# Patient Record
Sex: Female | Born: 1955 | Race: Black or African American | Hispanic: No | Marital: Married | Smoking: Never smoker
Health system: Southern US, Community
[De-identification: ages and names within clinical notes are randomized; demographics above are authoritative.]

## PROBLEM LIST (undated history)

## (undated) DIAGNOSIS — Z8619 Personal history of other infectious and parasitic diseases: Secondary | ICD-10-CM

## (undated) DIAGNOSIS — I471 Supraventricular tachycardia, unspecified: Secondary | ICD-10-CM

## (undated) DIAGNOSIS — E1140 Type 2 diabetes mellitus with diabetic neuropathy, unspecified: Secondary | ICD-10-CM

## (undated) DIAGNOSIS — E11319 Type 2 diabetes mellitus with unspecified diabetic retinopathy without macular edema: Secondary | ICD-10-CM

## (undated) DIAGNOSIS — R079 Chest pain, unspecified: Secondary | ICD-10-CM

## (undated) DIAGNOSIS — M549 Dorsalgia, unspecified: Secondary | ICD-10-CM

## (undated) DIAGNOSIS — H5440 Blindness, one eye, unspecified eye: Secondary | ICD-10-CM

## (undated) DIAGNOSIS — N289 Disorder of kidney and ureter, unspecified: Secondary | ICD-10-CM

## (undated) DIAGNOSIS — I252 Old myocardial infarction: Secondary | ICD-10-CM

## (undated) DIAGNOSIS — D500 Iron deficiency anemia secondary to blood loss (chronic): Secondary | ICD-10-CM

## (undated) DIAGNOSIS — H548 Legal blindness, as defined in USA: Secondary | ICD-10-CM

## (undated) DIAGNOSIS — Z973 Presence of spectacles and contact lenses: Secondary | ICD-10-CM

## (undated) DIAGNOSIS — R600 Localized edema: Secondary | ICD-10-CM

## (undated) DIAGNOSIS — H409 Unspecified glaucoma: Secondary | ICD-10-CM

## (undated) DIAGNOSIS — I5032 Chronic diastolic (congestive) heart failure: Secondary | ICD-10-CM

## (undated) DIAGNOSIS — N183 Chronic kidney disease, stage 3 unspecified: Secondary | ICD-10-CM

## (undated) DIAGNOSIS — G8929 Other chronic pain: Secondary | ICD-10-CM

## (undated) DIAGNOSIS — M1990 Unspecified osteoarthritis, unspecified site: Secondary | ICD-10-CM

## (undated) DIAGNOSIS — E785 Hyperlipidemia, unspecified: Secondary | ICD-10-CM

## (undated) DIAGNOSIS — R0600 Dyspnea, unspecified: Secondary | ICD-10-CM

## (undated) DIAGNOSIS — I2510 Atherosclerotic heart disease of native coronary artery without angina pectoris: Secondary | ICD-10-CM

## (undated) DIAGNOSIS — K219 Gastro-esophageal reflux disease without esophagitis: Secondary | ICD-10-CM

## (undated) DIAGNOSIS — I10 Essential (primary) hypertension: Secondary | ICD-10-CM

## (undated) DIAGNOSIS — R531 Weakness: Secondary | ICD-10-CM

## (undated) DIAGNOSIS — J302 Other seasonal allergic rhinitis: Secondary | ICD-10-CM

## (undated) DIAGNOSIS — G4733 Obstructive sleep apnea (adult) (pediatric): Secondary | ICD-10-CM

## (undated) DIAGNOSIS — Z6841 Body Mass Index (BMI) 40.0 and over, adult: Secondary | ICD-10-CM

## (undated) DIAGNOSIS — E119 Type 2 diabetes mellitus without complications: Secondary | ICD-10-CM

## (undated) DIAGNOSIS — Z794 Long term (current) use of insulin: Secondary | ICD-10-CM

## (undated) HISTORY — DX: Body Mass Index (BMI) 40.0 and over, adult: Z684

## (undated) HISTORY — PX: DILATION AND CURETTAGE OF UTERUS: SHX78

## (undated) HISTORY — DX: Other seasonal allergic rhinitis: J30.2

## (undated) HISTORY — DX: Gastro-esophageal reflux disease without esophagitis: K21.9

## (undated) HISTORY — DX: Morbid (severe) obesity due to excess calories: E66.01

## (undated) HISTORY — PX: CARDIAC CATHETERIZATION: SHX172

## (undated) HISTORY — PX: CATARACT EXTRACTION W/ INTRAOCULAR LENS IMPLANT: SHX1309

## (undated) HISTORY — PX: CATARACT EXTRACTION: SUR2

## (undated) HISTORY — PX: EYE SURGERY: SHX253

---

## 2006-09-06 ENCOUNTER — Ambulatory Visit: Payer: Self-pay | Admitting: Internal Medicine

## 2006-09-07 ENCOUNTER — Ambulatory Visit: Payer: Self-pay | Admitting: *Deleted

## 2006-09-07 ENCOUNTER — Encounter (INDEPENDENT_AMBULATORY_CARE_PROVIDER_SITE_OTHER): Payer: Self-pay | Admitting: Nurse Practitioner

## 2006-09-07 LAB — CONVERTED CEMR LAB
MCHC: 31.5 g/dL
MCV: 84.4 fL
RBC: 5 M/uL

## 2006-09-13 ENCOUNTER — Ambulatory Visit: Payer: Self-pay | Admitting: Internal Medicine

## 2006-10-11 ENCOUNTER — Ambulatory Visit: Payer: Self-pay | Admitting: Internal Medicine

## 2006-10-13 ENCOUNTER — Ambulatory Visit: Payer: Self-pay | Admitting: Internal Medicine

## 2006-11-01 ENCOUNTER — Encounter (INDEPENDENT_AMBULATORY_CARE_PROVIDER_SITE_OTHER): Payer: Self-pay | Admitting: Nurse Practitioner

## 2006-11-01 ENCOUNTER — Ambulatory Visit: Payer: Self-pay | Admitting: Internal Medicine

## 2006-11-01 LAB — CONVERTED CEMR LAB
CO2: 27 meq/L
Calcium: 9.3 mg/dL
Creatinine, Ser: 0.77 mg/dL
Potassium: 4.5 meq/L
Sodium: 142 meq/L

## 2007-04-17 ENCOUNTER — Encounter (INDEPENDENT_AMBULATORY_CARE_PROVIDER_SITE_OTHER): Payer: Self-pay | Admitting: Nurse Practitioner

## 2007-04-17 ENCOUNTER — Telehealth (INDEPENDENT_AMBULATORY_CARE_PROVIDER_SITE_OTHER): Payer: Self-pay | Admitting: Internal Medicine

## 2007-04-17 DIAGNOSIS — D509 Iron deficiency anemia, unspecified: Secondary | ICD-10-CM

## 2007-04-17 DIAGNOSIS — I10 Essential (primary) hypertension: Secondary | ICD-10-CM

## 2007-04-18 ENCOUNTER — Ambulatory Visit: Payer: Self-pay | Admitting: Nurse Practitioner

## 2007-04-18 DIAGNOSIS — H5310 Unspecified subjective visual disturbances: Secondary | ICD-10-CM | POA: Insufficient documentation

## 2007-04-18 LAB — CONVERTED CEMR LAB
ALT: <8 units/L (ref 0–35)
AST: 12 units/L (ref 0–37)
Anti Nuclear Antibody(ANA): NEGATIVE
Basophils Absolute: 0 10*3/uL (ref 0.0–0.1)
Basophils Relative: 0 % (ref 0–1)
CO2: 22 meq/L (ref 19–32)
Calcium: 9.3 mg/dL (ref 8.4–10.5)
Chloride: 101 meq/L (ref 96–112)
Creatinine, Ser: 0.84 mg/dL (ref 0.40–1.20)
Eosinophils Absolute: 0.1 10*3/uL (ref 0.0–0.7)
Eosinophils Relative: 1 % (ref 0–5)
HCT: 39.2 % (ref 36.0–46.0)
Hemoglobin: 12.7 g/dL (ref 12.0–15.0)
MCHC: 32.4 g/dL (ref 30.0–36.0)
MCV: 82.7 fL (ref 78.0–100.0)
Neutro Abs: 8 10*3/uL — ABNORMAL HIGH (ref 1.7–7.7)
Platelets: 311 10*3/uL (ref 150–400)
Potassium: 3.8 meq/L (ref 3.5–5.3)
Total Protein: 7.6 g/dL (ref 6.0–8.3)
WBC: 11.1 10*3/uL — ABNORMAL HIGH (ref 4.0–10.5)

## 2007-04-19 ENCOUNTER — Encounter (INDEPENDENT_AMBULATORY_CARE_PROVIDER_SITE_OTHER): Payer: Self-pay | Admitting: *Deleted

## 2007-05-08 ENCOUNTER — Encounter (INDEPENDENT_AMBULATORY_CARE_PROVIDER_SITE_OTHER): Payer: Self-pay | Admitting: Internal Medicine

## 2007-05-11 ENCOUNTER — Encounter (INDEPENDENT_AMBULATORY_CARE_PROVIDER_SITE_OTHER): Payer: Self-pay | Admitting: Internal Medicine

## 2007-06-16 ENCOUNTER — Encounter (INDEPENDENT_AMBULATORY_CARE_PROVIDER_SITE_OTHER): Payer: Self-pay | Admitting: Internal Medicine

## 2007-06-17 ENCOUNTER — Inpatient Hospital Stay (HOSPITAL_COMMUNITY): Admission: EM | Admit: 2007-06-17 | Discharge: 2007-06-20 | Payer: Self-pay | Admitting: *Deleted

## 2007-07-10 ENCOUNTER — Telehealth (INDEPENDENT_AMBULATORY_CARE_PROVIDER_SITE_OTHER): Payer: Self-pay | Admitting: Internal Medicine

## 2007-07-17 ENCOUNTER — Telehealth (INDEPENDENT_AMBULATORY_CARE_PROVIDER_SITE_OTHER): Payer: Self-pay | Admitting: Internal Medicine

## 2007-08-07 ENCOUNTER — Encounter (INDEPENDENT_AMBULATORY_CARE_PROVIDER_SITE_OTHER): Payer: Self-pay | Admitting: Internal Medicine

## 2007-08-24 ENCOUNTER — Encounter (INDEPENDENT_AMBULATORY_CARE_PROVIDER_SITE_OTHER): Payer: Self-pay | Admitting: Nurse Practitioner

## 2007-09-07 DIAGNOSIS — Z8669 Personal history of other diseases of the nervous system and sense organs: Secondary | ICD-10-CM

## 2007-09-22 ENCOUNTER — Ambulatory Visit: Payer: Self-pay | Admitting: Internal Medicine

## 2007-09-28 ENCOUNTER — Ambulatory Visit (HOSPITAL_COMMUNITY): Admission: RE | Admit: 2007-09-28 | Discharge: 2007-09-28 | Payer: Self-pay | Admitting: Ophthalmology

## 2007-10-07 ENCOUNTER — Encounter (INDEPENDENT_AMBULATORY_CARE_PROVIDER_SITE_OTHER): Payer: Self-pay | Admitting: Internal Medicine

## 2007-10-07 DIAGNOSIS — E113599 Type 2 diabetes mellitus with proliferative diabetic retinopathy without macular edema, unspecified eye: Secondary | ICD-10-CM

## 2007-10-10 ENCOUNTER — Encounter (INDEPENDENT_AMBULATORY_CARE_PROVIDER_SITE_OTHER): Payer: Self-pay | Admitting: Nurse Practitioner

## 2007-10-11 ENCOUNTER — Ambulatory Visit (HOSPITAL_COMMUNITY): Admission: RE | Admit: 2007-10-11 | Discharge: 2007-10-11 | Payer: Self-pay | Admitting: Ophthalmology

## 2007-10-20 ENCOUNTER — Ambulatory Visit: Payer: Self-pay | Admitting: Internal Medicine

## 2007-10-20 DIAGNOSIS — M25519 Pain in unspecified shoulder: Secondary | ICD-10-CM

## 2007-10-20 LAB — CONVERTED CEMR LAB: Blood Glucose, Fingerstick: 140

## 2007-10-21 ENCOUNTER — Encounter (INDEPENDENT_AMBULATORY_CARE_PROVIDER_SITE_OTHER): Payer: Self-pay | Admitting: Internal Medicine

## 2007-10-21 LAB — CONVERTED CEMR LAB
ALT: 9 units/L (ref 0–35)
Albumin: 3.7 g/dL (ref 3.5–5.2)
CO2: 24 meq/L (ref 19–32)
Chloride: 100 meq/L (ref 96–112)
Cholesterol: 169 mg/dL (ref 0–200)
Glucose, Bld: 98 mg/dL (ref 70–99)
Potassium: 4 meq/L (ref 3.5–5.3)
Sodium: 139 meq/L (ref 135–145)
Total Bilirubin: 0.4 mg/dL (ref 0.3–1.2)
Total Protein: 7.6 g/dL (ref 6.0–8.3)
Triglycerides: 61 mg/dL (ref <150)
VLDL: 12 mg/dL (ref 0–40)

## 2007-11-06 ENCOUNTER — Ambulatory Visit (HOSPITAL_COMMUNITY): Admission: RE | Admit: 2007-11-06 | Discharge: 2007-11-06 | Payer: Self-pay | Admitting: Ophthalmology

## 2007-11-08 ENCOUNTER — Ambulatory Visit (HOSPITAL_COMMUNITY): Admission: RE | Admit: 2007-11-08 | Discharge: 2007-11-08 | Payer: Self-pay | Admitting: Ophthalmology

## 2007-11-20 ENCOUNTER — Ambulatory Visit (HOSPITAL_COMMUNITY): Admission: RE | Admit: 2007-11-20 | Discharge: 2007-11-20 | Payer: Self-pay | Admitting: Ophthalmology

## 2007-11-27 ENCOUNTER — Ambulatory Visit (HOSPITAL_COMMUNITY): Admission: RE | Admit: 2007-11-27 | Discharge: 2007-11-27 | Payer: Self-pay | Admitting: Ophthalmology

## 2007-11-27 ENCOUNTER — Emergency Department (HOSPITAL_COMMUNITY): Admission: EM | Admit: 2007-11-27 | Discharge: 2007-11-27 | Payer: Self-pay | Admitting: Emergency Medicine

## 2008-01-15 ENCOUNTER — Ambulatory Visit (HOSPITAL_COMMUNITY): Admission: RE | Admit: 2008-01-15 | Discharge: 2008-01-15 | Payer: Self-pay | Admitting: Ophthalmology

## 2008-02-19 ENCOUNTER — Ambulatory Visit (HOSPITAL_COMMUNITY): Admission: RE | Admit: 2008-02-19 | Discharge: 2008-02-19 | Payer: Self-pay | Admitting: Ophthalmology

## 2008-07-24 ENCOUNTER — Encounter (INDEPENDENT_AMBULATORY_CARE_PROVIDER_SITE_OTHER): Payer: Self-pay | Admitting: Internal Medicine

## 2008-12-09 ENCOUNTER — Telehealth (INDEPENDENT_AMBULATORY_CARE_PROVIDER_SITE_OTHER): Payer: Self-pay | Admitting: *Deleted

## 2009-02-25 ENCOUNTER — Ambulatory Visit: Payer: Self-pay | Admitting: Internal Medicine

## 2009-02-25 ENCOUNTER — Inpatient Hospital Stay (HOSPITAL_COMMUNITY): Admission: EM | Admit: 2009-02-25 | Discharge: 2009-03-04 | Payer: Self-pay | Admitting: Emergency Medicine

## 2009-02-26 ENCOUNTER — Encounter (INDEPENDENT_AMBULATORY_CARE_PROVIDER_SITE_OTHER): Payer: Self-pay | Admitting: Cardiology

## 2009-02-27 ENCOUNTER — Ambulatory Visit: Payer: Self-pay | Admitting: Surgery

## 2009-02-27 ENCOUNTER — Telehealth (INDEPENDENT_AMBULATORY_CARE_PROVIDER_SITE_OTHER): Payer: Self-pay | Admitting: Internal Medicine

## 2009-02-27 DIAGNOSIS — I2510 Atherosclerotic heart disease of native coronary artery without angina pectoris: Secondary | ICD-10-CM | POA: Insufficient documentation

## 2009-03-11 LAB — CONVERTED CEMR LAB: Pap Smear: NORMAL

## 2009-03-12 ENCOUNTER — Observation Stay (HOSPITAL_COMMUNITY): Admission: EM | Admit: 2009-03-12 | Discharge: 2009-03-14 | Payer: Self-pay | Admitting: Emergency Medicine

## 2009-03-20 ENCOUNTER — Ambulatory Visit: Payer: Self-pay | Admitting: Internal Medicine

## 2009-03-20 DIAGNOSIS — N8502 Endometrial intraepithelial neoplasia [EIN]: Secondary | ICD-10-CM | POA: Insufficient documentation

## 2009-03-20 DIAGNOSIS — R609 Edema, unspecified: Secondary | ICD-10-CM | POA: Insufficient documentation

## 2009-03-20 DIAGNOSIS — N950 Postmenopausal bleeding: Secondary | ICD-10-CM

## 2009-04-10 ENCOUNTER — Encounter (INDEPENDENT_AMBULATORY_CARE_PROVIDER_SITE_OTHER): Payer: Self-pay | Admitting: Internal Medicine

## 2009-05-13 ENCOUNTER — Encounter (INDEPENDENT_AMBULATORY_CARE_PROVIDER_SITE_OTHER): Payer: Self-pay | Admitting: Internal Medicine

## 2009-05-15 ENCOUNTER — Encounter: Payer: Self-pay | Admitting: Internal Medicine

## 2009-05-15 ENCOUNTER — Inpatient Hospital Stay (HOSPITAL_COMMUNITY): Admission: EM | Admit: 2009-05-15 | Discharge: 2009-05-19 | Payer: Self-pay | Admitting: Emergency Medicine

## 2009-05-15 ENCOUNTER — Ambulatory Visit: Payer: Self-pay | Admitting: Internal Medicine

## 2009-05-19 ENCOUNTER — Encounter (INDEPENDENT_AMBULATORY_CARE_PROVIDER_SITE_OTHER): Payer: Self-pay | Admitting: Internal Medicine

## 2009-05-19 ENCOUNTER — Encounter: Payer: Self-pay | Admitting: Internal Medicine

## 2009-05-22 ENCOUNTER — Ambulatory Visit: Payer: Self-pay | Admitting: Internal Medicine

## 2009-05-22 LAB — CONVERTED CEMR LAB: Blood Glucose, Fingerstick: 281

## 2009-06-03 ENCOUNTER — Encounter (INDEPENDENT_AMBULATORY_CARE_PROVIDER_SITE_OTHER): Payer: Self-pay | Admitting: Internal Medicine

## 2009-06-11 ENCOUNTER — Encounter (INDEPENDENT_AMBULATORY_CARE_PROVIDER_SITE_OTHER): Payer: Self-pay | Admitting: Internal Medicine

## 2009-06-16 ENCOUNTER — Encounter (HOSPITAL_COMMUNITY): Admission: RE | Admit: 2009-06-16 | Discharge: 2009-09-14 | Payer: Self-pay | Admitting: Cardiology

## 2009-07-04 ENCOUNTER — Emergency Department (HOSPITAL_COMMUNITY): Admission: EM | Admit: 2009-07-04 | Discharge: 2009-07-04 | Payer: Self-pay | Admitting: Emergency Medicine

## 2009-08-06 ENCOUNTER — Encounter (INDEPENDENT_AMBULATORY_CARE_PROVIDER_SITE_OTHER): Payer: Self-pay | Admitting: Internal Medicine

## 2009-08-21 ENCOUNTER — Ambulatory Visit: Payer: Self-pay | Admitting: Internal Medicine

## 2009-08-21 DIAGNOSIS — K219 Gastro-esophageal reflux disease without esophagitis: Secondary | ICD-10-CM

## 2009-08-21 DIAGNOSIS — R42 Dizziness and giddiness: Secondary | ICD-10-CM

## 2009-08-21 LAB — CONVERTED CEMR LAB: Blood Glucose, Fingerstick: 203

## 2009-09-15 ENCOUNTER — Encounter (HOSPITAL_COMMUNITY): Admission: RE | Admit: 2009-09-15 | Discharge: 2009-10-30 | Payer: Self-pay | Admitting: Cardiology

## 2009-09-26 ENCOUNTER — Encounter (INDEPENDENT_AMBULATORY_CARE_PROVIDER_SITE_OTHER): Payer: Self-pay | Admitting: Internal Medicine

## 2009-10-02 ENCOUNTER — Ambulatory Visit: Payer: Self-pay | Admitting: Internal Medicine

## 2009-10-02 LAB — CONVERTED CEMR LAB: Blood Glucose, Fingerstick: 124

## 2009-10-04 LAB — CONVERTED CEMR LAB
ALT: 9 units/L (ref 0–35)
AST: 10 units/L (ref 0–37)
Alkaline Phosphatase: 46 units/L (ref 39–117)
BUN: 14 mg/dL (ref 6–23)
Basophils Absolute: 0 10*3/uL (ref 0.0–0.1)
Creatinine, Ser: 0.94 mg/dL (ref 0.40–1.20)
Eosinophils Absolute: 0 10*3/uL (ref 0.0–0.7)
Eosinophils Relative: 0 % (ref 0–5)
HCT: 33.1 % — ABNORMAL LOW (ref 36.0–46.0)
HDL: 41 mg/dL (ref >39)
LDL Cholesterol: 38 mg/dL (ref 0–99)
Lymphocytes Relative: 19 % (ref 12–46)
MCV: 87.8 fL (ref 78.0–100.0)
Microalb, Ur: 0.5 mg/dL (ref 0.00–1.89)
Neutrophils Relative %: 75 % (ref 43–77)
Platelets: 366 10*3/uL (ref 150–400)
RDW: 15.9 % — ABNORMAL HIGH (ref 11.5–15.5)
Total CHOL/HDL Ratio: 2.3
VLDL: 14 mg/dL (ref 0–40)
WBC: 11.4 10*3/uL — ABNORMAL HIGH (ref 4.0–10.5)

## 2009-10-06 ENCOUNTER — Encounter (INDEPENDENT_AMBULATORY_CARE_PROVIDER_SITE_OTHER): Payer: Self-pay | Admitting: Internal Medicine

## 2009-10-24 ENCOUNTER — Inpatient Hospital Stay (HOSPITAL_COMMUNITY): Admission: EM | Admit: 2009-10-24 | Discharge: 2009-10-25 | Payer: Self-pay | Admitting: Emergency Medicine

## 2009-10-27 LAB — CONVERTED CEMR LAB: Iron: 172 ug/dL — ABNORMAL HIGH (ref 42–145)

## 2009-10-29 ENCOUNTER — Ambulatory Visit: Payer: Self-pay | Admitting: Internal Medicine

## 2009-10-31 ENCOUNTER — Telehealth (INDEPENDENT_AMBULATORY_CARE_PROVIDER_SITE_OTHER): Payer: Self-pay | Admitting: Internal Medicine

## 2009-11-04 ENCOUNTER — Inpatient Hospital Stay (HOSPITAL_COMMUNITY): Admission: AD | Admit: 2009-11-04 | Discharge: 2009-11-04 | Payer: Self-pay | Admitting: Obstetrics & Gynecology

## 2009-11-06 ENCOUNTER — Encounter (INDEPENDENT_AMBULATORY_CARE_PROVIDER_SITE_OTHER): Payer: Self-pay | Admitting: Internal Medicine

## 2009-11-08 ENCOUNTER — Inpatient Hospital Stay (HOSPITAL_COMMUNITY): Admission: EM | Admit: 2009-11-08 | Discharge: 2009-11-11 | Payer: Self-pay | Admitting: Emergency Medicine

## 2009-11-19 LAB — CONVERTED CEMR LAB: RBC Folate: 449 ng/mL (ref 180–600)

## 2009-11-20 ENCOUNTER — Encounter (INDEPENDENT_AMBULATORY_CARE_PROVIDER_SITE_OTHER): Payer: Self-pay | Admitting: Internal Medicine

## 2010-01-08 ENCOUNTER — Inpatient Hospital Stay (HOSPITAL_COMMUNITY): Admission: EM | Admit: 2010-01-08 | Discharge: 2010-01-11 | Payer: Self-pay | Admitting: Emergency Medicine

## 2010-01-23 ENCOUNTER — Encounter (INDEPENDENT_AMBULATORY_CARE_PROVIDER_SITE_OTHER): Payer: Self-pay | Admitting: Internal Medicine

## 2010-01-29 ENCOUNTER — Ambulatory Visit: Admission: RE | Admit: 2010-01-29 | Discharge: 2010-01-29 | Payer: Self-pay | Admitting: Gynecologic Oncology

## 2010-02-05 ENCOUNTER — Telehealth: Payer: Self-pay | Admitting: Physician Assistant

## 2010-02-06 ENCOUNTER — Inpatient Hospital Stay (HOSPITAL_COMMUNITY): Admission: EM | Admit: 2010-02-06 | Discharge: 2010-02-08 | Payer: Self-pay | Admitting: Emergency Medicine

## 2010-02-09 ENCOUNTER — Telehealth (INDEPENDENT_AMBULATORY_CARE_PROVIDER_SITE_OTHER): Payer: Self-pay | Admitting: Internal Medicine

## 2010-02-19 ENCOUNTER — Inpatient Hospital Stay (HOSPITAL_COMMUNITY): Admission: EM | Admit: 2010-02-19 | Discharge: 2010-02-23 | Payer: Self-pay | Admitting: Emergency Medicine

## 2010-02-24 ENCOUNTER — Ambulatory Visit: Payer: Self-pay | Admitting: Internal Medicine

## 2010-02-24 ENCOUNTER — Telehealth (INDEPENDENT_AMBULATORY_CARE_PROVIDER_SITE_OTHER): Payer: Self-pay | Admitting: Internal Medicine

## 2010-02-24 LAB — CONVERTED CEMR LAB: Blood Glucose, Fingerstick: 103

## 2010-03-02 ENCOUNTER — Ambulatory Visit (HOSPITAL_COMMUNITY): Admission: RE | Admit: 2010-03-02 | Discharge: 2010-03-02 | Payer: Self-pay | Admitting: Internal Medicine

## 2010-03-19 ENCOUNTER — Encounter (INDEPENDENT_AMBULATORY_CARE_PROVIDER_SITE_OTHER): Payer: Self-pay | Admitting: Internal Medicine

## 2010-03-23 ENCOUNTER — Encounter (INDEPENDENT_AMBULATORY_CARE_PROVIDER_SITE_OTHER): Payer: Self-pay | Admitting: Internal Medicine

## 2010-03-24 ENCOUNTER — Encounter (INDEPENDENT_AMBULATORY_CARE_PROVIDER_SITE_OTHER): Payer: Self-pay | Admitting: Internal Medicine

## 2010-04-03 ENCOUNTER — Ambulatory Visit: Payer: Self-pay | Admitting: Internal Medicine

## 2010-04-03 LAB — CONVERTED CEMR LAB
BUN: 12 mg/dL (ref 6–23)
Basophils Relative: 0 % (ref 0–1)
Blood Glucose, Fingerstick: 276
CO2: 26 meq/L (ref 19–32)
Calcium: 8.9 mg/dL (ref 8.4–10.5)
Chloride: 101 meq/L (ref 96–112)
Creatinine, Ser: 0.98 mg/dL (ref 0.40–1.20)
Glucose, Urine, Semiquant: 250
HCT: 38 % (ref 36.0–46.0)
Hemoglobin: 12.4 g/dL (ref 12.0–15.0)
Hgb A1c MFr Bld: 7.4 % — ABNORMAL HIGH (ref <5.7)
KOH Prep: NEGATIVE
Lymphocytes Relative: 19 % (ref 12–46)
MCHC: 32.6 g/dL (ref 30.0–36.0)
Monocytes Absolute: 0.7 10*3/uL (ref 0.1–1.0)
Monocytes Relative: 7 % (ref 3–12)
Neutro Abs: 7.5 10*3/uL (ref 1.7–7.7)
Nitrite: NEGATIVE
Pap Smear: NEGATIVE
RBC: 4.17 M/uL (ref 3.87–5.11)
Specific Gravity, Urine: 1.01
Urobilinogen, UA: 0.2
pH: 6.5

## 2010-04-07 ENCOUNTER — Encounter (INDEPENDENT_AMBULATORY_CARE_PROVIDER_SITE_OTHER): Payer: Self-pay | Admitting: Internal Medicine

## 2010-04-07 ENCOUNTER — Telehealth (INDEPENDENT_AMBULATORY_CARE_PROVIDER_SITE_OTHER): Payer: Self-pay | Admitting: Internal Medicine

## 2010-04-15 ENCOUNTER — Telehealth: Payer: Self-pay | Admitting: Physician Assistant

## 2010-04-20 ENCOUNTER — Telehealth (INDEPENDENT_AMBULATORY_CARE_PROVIDER_SITE_OTHER): Payer: Self-pay | Admitting: Internal Medicine

## 2010-04-29 ENCOUNTER — Ambulatory Visit (HOSPITAL_COMMUNITY): Admission: RE | Admit: 2010-04-29 | Discharge: 2010-04-29 | Payer: Self-pay | Admitting: Internal Medicine

## 2010-04-29 ENCOUNTER — Emergency Department (HOSPITAL_COMMUNITY): Admission: EM | Admit: 2010-04-29 | Discharge: 2010-04-29 | Payer: Self-pay | Admitting: Emergency Medicine

## 2010-05-01 ENCOUNTER — Telehealth (INDEPENDENT_AMBULATORY_CARE_PROVIDER_SITE_OTHER): Payer: Self-pay | Admitting: Internal Medicine

## 2010-05-06 ENCOUNTER — Encounter (INDEPENDENT_AMBULATORY_CARE_PROVIDER_SITE_OTHER): Payer: Self-pay | Admitting: Internal Medicine

## 2010-05-28 ENCOUNTER — Ambulatory Visit: Payer: Self-pay | Admitting: Obstetrics and Gynecology

## 2010-06-11 ENCOUNTER — Other Ambulatory Visit
Admission: RE | Admit: 2010-06-11 | Discharge: 2010-06-11 | Payer: Self-pay | Source: Home / Self Care | Admitting: Obstetrics and Gynecology

## 2010-06-11 ENCOUNTER — Ambulatory Visit: Payer: Self-pay | Admitting: Obstetrics and Gynecology

## 2010-07-03 ENCOUNTER — Ambulatory Visit: Payer: Self-pay | Admitting: Obstetrics & Gynecology

## 2010-07-03 ENCOUNTER — Encounter (INDEPENDENT_AMBULATORY_CARE_PROVIDER_SITE_OTHER): Payer: Self-pay | Admitting: Internal Medicine

## 2010-07-06 ENCOUNTER — Encounter (INDEPENDENT_AMBULATORY_CARE_PROVIDER_SITE_OTHER): Payer: Self-pay | Admitting: Internal Medicine

## 2010-08-04 ENCOUNTER — Ambulatory Visit: Admit: 2010-08-04 | Payer: Self-pay | Admitting: Internal Medicine

## 2010-08-19 ENCOUNTER — Telehealth (INDEPENDENT_AMBULATORY_CARE_PROVIDER_SITE_OTHER): Payer: Self-pay | Admitting: Internal Medicine

## 2010-09-01 NOTE — Progress Notes (Signed)
Summary: GYN REFERRAL    Phone Note Outgoing Call   Summary of Call: Referral to [facility] for follow up of permenopausal bleeding and ovarian cysts.  Letter and orders written.  Pt. to sign release by end of week to get her records from Drs. Dillard and [employer] regarding previous evaluation and treatment of above. Initial call taken by: Mack Hook MD,  February 24, 2010 5:48 PM  Follow-up for Phone Call        PT HAVE AN APPT 03-02-10 @ 11:15 AM AT [place]. Follow-up by: Maren Reamer,  February 25, 2010 4:24 PM

## 2010-09-01 NOTE — Assessment & Plan Note (Signed)
Summary: 1 MONTH FU ON [place]/DIZZINESS///KT   Vital Signs:  Patient profile:   55 year old female Weight:      265 pounds Temp:     98.3 degrees F Pulse rate:   85 / minute Pulse rhythm:   regular Resp:     20 per minute BP sitting:   139 / 82  (left arm) Cuff size:   large  Vitals Entered By: Shellia Carwin CMA (October 02, 2009 9:13 AM) CC: one month f/u dizzy, nausea.  Pt states she is feeling better. Is Patient Diabetic? Yes Pain Assessment Patient in pain? no      CBG Result 124  Does patient need assistance? Ambulation Impaired:Risk for fall   CC:  one month f/u dizzy and nausea.  Pt states she is feeling better.Marland Kitchen  History of Present Illness: 1. Dizziness and nausea:  no further symptoms.  Is still going to cardiac rehab.  2.  DM:  Sugar down to 92 yesterday morning before Cardiac Rehab.  Describes a decent breafast and dinner night before.  Had 2 graham crackers with peanut butter at rehab.  Did get exercise subsequently.  Sugar a couple of days earlier above 300 about 1 1/2 hours after eating breakfast--raisin bran, banana, cup of tea with pack of Splenda and honey lemon cough drop.  Sugars, however, generally running better--170 range.    3.  Diabetic eye changes:  Was seeing Dr. Radene Ou for this. Having more difficulties with left vision.  No longer able to go back to Dr. Radene Ou.  Has had   4.  GERD:  Doing great with switch from Nexium to Famotidine.   5.  Peripheral edema:  still requiring two times a day Furosemide.  Has not been able to afford to go back to Dr. Rex Kras.    Pt. is nonfasting today.  Allergies (verified): No Known Drug Allergies  Past History:  Past Surgical History: D&C x 2 for menometrorrhagia Left salpingo-oophorectom secondar to ovarian cyst Bilateral Cataract excision--Dr. Radene Ou Multple laser surgeries to bilateral eyes--Dr. Radene Ou  Physical Exam  General:  Obese, NAD Lungs:  Normal respiratory effort, chest expands  symmetrically. Lungs are clear to auscultation, no crackles or wheezes. Heart:  Normal rate and regular rhythm. S1 and S2 normal without gallop, murmur, click, rub or other extra sounds.  Radial pulses normal and equal Extremities:  mild-moderate pitting edema of ankles   Impression & Recommendations:  Problem # 1:  DIZZINESS (ICD-780.4) Resolved Has not had labs checked in some time--general labs--nonfasting today  Problem # 2:  PERIPHERAL EDEMA (ICD-782.3)  Stable with two times a day dosing of Furosemide Her updated medication list for this problem includes:    Hyzaar 100-25 Mg Tabs (Losartan potassium-hctz) .Marland Kitchen... 1 tab by mouth daily (pa authorized x 1 year from 1.5.2011)    Furosemide 40 Mg Tabs (Furosemide) .Marland Kitchen... 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 54 units subcutaneously two times a day before meals.  increased from 50    Hyzaar 100-25 Mg Tabs (Losartan potassium-hctz) .Marland Kitchen... 1 tab by  mouth daily (pa authorized x 1 year from 1.5.2011)    Aspirin Low Strength 81 Mg Chew (Aspirin) .Marland Kitchen... 2 tabs by mouth daily  Orders: T-Urine Microalbumin w/creat. ratio [id number])  Complete Medication List: 1)  Metoprolol Succinate 50 Mg Xr24h-tab (Metoprolol succinate) .... 3 tabs by mouth daily 2)  Novolog Mix 70/30 70-30 % Susp (Insulin aspart prot & aspart) .... 54 units subcutaneously two times a day before meals.  increased from 50 3)  K-tabs 10 Meq Tbcr (Potassium chloride) .Marland Kitchen.. 1 by mouth once daily 4)  Ferrous  Sulfate 325 (65 Fe) Mg Tbec (Ferrous sulfate) .Marland Kitchen.. 1 tab by mouth daily 5)  Hyzaar 100-25 Mg Tabs (Losartan potassium-hctz) .Marland Kitchen.. 1 tab by mouth daily (pa authorized x 1 year from 1.5.2011) 6)  Vytorin 10-40 Mg Tabs (Ezetimibe-simvastatin) .... Take 1 tablet by mouth once a day (pharmacy note pa authorized for one year from 08/13/2009) 7)  Hydralazine Hcl 50 Mg Tabs (Hydralazine hcl) .... Two times a day 8)  Diltiazem Hcl Er Beads 120 Mg Xr24h-cap (Diltiazem hcl er beads) .... Take 1 tablet by mouth once a day 9)  Aspirin Low Strength 81 Mg Chew (Aspirin) .... 2 tabs by mouth daily 10)  Isosorbide Mononitrate Cr 30 Mg Xr24h-tab (Isosorbide mononitrate) .... 2 tabs by mouth two times a day 11)  Nitroglycerin 0.4 Mg Subl (Nitroglycerin) .Marland Kitchen.. 1 sublingual as needed chest pain.  may repeat q 5 minutes x 2 if not relieved. 12)  Furosemide 40 Mg Tabs (Furosemide) .... Take 1 qam and 1 by 2pm until swelling goes done then go back to  once daily 13)  Ranexa 500 Mg Xr12h-tab (Ranolazine) .... Two times a day 14)  Plavix 75 Mg Tabs (Clopidogrel bisulfate) .... Take 1 tablet by mouth once a day 15)  Famotidine 40 Mg Tabs (Famotidine) .Marland Kitchen.. 1 tab by mouth at bedtime 16)  Prodigy Blood Glucose Monitor Devi (Blood glucose monitoring suppl) .... Two times a day sugar checks  250.02 17)  Prodigy Blood Glucose Test Strp (Glucose blood) .... Two times a day blood sugar checks  250.02 18)  Prodigy Lancing Device Misc (Lancet devices) .... Two times a day blood sugar checks 250.02 19)  Monoject Insulin Syringe 30g X 5/16" 1 Ml Misc (Insulin syringe-needle u-100) .... Two times a day insulin injections  Other Orders: Capillary Blood Glucose/CBG [id number]) T-CBC w/Diff [id number]) T-Lipid Profile [id number])  Patient Instructions: 1)  [facility]:  Please call and get set up with appt. [phone] 2)  CPP with Dr. Amil Amen in 4 months

## 2010-09-01 NOTE — Letter (Signed)
Summary: [facility]   Imported By: Roland Earl 09/29/2009 10:40:23  _____________________________________________________________________  External Attachment:    Type:   Image     Comment:   External Document

## 2010-09-01 NOTE — Letter (Signed)
Summary: [facility]ERCISE FLOW SHEET  EXERC[facility]OW [facility]ET   Imported By: Roland Earl 09/26/2009 12:38:08  _____________________________________________________________________  External Attachment:    Type:   Image     Comment:   External Document

## 2010-09-01 NOTE — Letter (Signed)
Summary: [facility]   Imported By: Roland Earl 03/31/2010 14:35:37  _____________________________________________________________________  External Attachment:    Type:   Image     Comment:   External Document

## 2010-09-01 NOTE — Letter (Signed)
Summary: *Referral Letter  [facility]  [address] [place], [place] [address]   Phone: [phone]  Fax: [phone]    10/02/2009  Thank you in advance for agreeing to see my patient:  [name] [address] [place], [place]  [address]  Phone: [phone]  Reason for Referral: Worsening vision in left eye with history of significant retinal disease, diabetic.  Previously followed by Dr. Zadie Rhine.  Lost Medicaid.  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  increased from 50 3)  K-TABS 10 MEQ  TBCR (POTASSIUM CHLORIDE) 1 by mouth once daily 4)  FERROUS SULFATE 325 (65 FE) MG  TBEC (FERROUS SULFATE) 1 tab by mouth daily 5)  HYZAAR 100-25 MG  TABS (LOSARTAN POTASSIUM-HCTZ) 1 tab by mouth daily (PA authorized x 1 year from 1.5.2011) 6)  VYTORIN 10-40 MG TABS (EZETIMIBE-SIMVASTATIN) Take 1 tablet by mouth once a day (pharmacy note PA authorized for one year from 08/13/2009) 7)  HYDRALAZINE HCL 50 MG TABS (HYDRALAZINE HCL) two times a day 8)  DILTIAZEM HCL ER BEADS 120 MG XR24H-CAP (DILTIAZEM HCL ER BEADS)  Take 1 tablet by mouth once a day 9)  ASPIRIN LOW STRENGTH 81 MG CHEW (ASPIRIN) 2 tabs by mouth daily 10)  ISOSORBIDE MONONITRATE CR 30 MG XR24H-TAB (ISOSORBIDE MONONITRATE) 2 tabs by mouth two times a day 11)  NITROGLYCERIN 0.4 MG SUBL (NITROGLYCERIN) 1 sublingual as needed chest pain.  May repeat q 5 minutes x 2 if not relieved. 12)  FUROSEMIDE 40 MG TABS (FUROSEMIDE) Take 1 qam and 1 by 2pm until swelling goes done then go back to  once daily 13)  RANEXA 500 MG XR12H-TAB (RANOLAZINE) two times a day 14)  PLAVIX 75 MG TABS (CLOPIDOGREL BISULFATE) Take 1 tablet by mouth once a day 15)  FAMOTIDINE 40 MG TABS (FAMOTIDINE) 1 tab by mouth at bedtime 16)  PRODIGY BLOOD GLUCOSE MONITOR  DEVI (BLOOD GLUCOSE MONITORING SUPPL) two times a day sugar checks  250.02 17)  PRODIGY BLOOD GLUCOSE TEST  STRP (GLUCOSE BLOOD) two times a day blood sugar checks  250.02 18)  PRODIGY LANCING DEVICE  MISC (LANCET DEVICES) two times a day blood sugar checks 250.02 19)  MONOJECT INSULIN SYRINGE 30G X 5/16" 1 ML MISC (INSULIN SYRINGE-NEEDLE U-100) two times a day insulin injections   Past Medical History: 1)  ANEMIA, IRON DEFICIENCY (ICD-280.9) 2)  HYPERTENSION, BENIGN ESSENTIAL (ICD-401.1) 3)  DIABETES MELLITUS, TYPE II (ICD-250.00)   Prior History of Blood Transfusions:   Pertinent Labs:    Thank you again for agreeing to see our patient; please contact us if you have any further questions or need additional information.  Sincerely,  Mack Hook MD

## 2010-09-01 NOTE — Letter (Signed)
Summary: APT DATE & TIME @[facility] [facility] [place]   Imported By: Roland Earl 03/24/2010 15:42:09  _____________________________________________________________________  External Attachment:    Type:   Image     Comment:   External Document

## 2010-09-01 NOTE — Letter (Signed)
Summary: *Referral Letter  [facility]  [address] [place], [place] [address]   Phone: [phone]  Fax: [phone]    02/24/2010  Thank you in advance for agreeing to see my patient:  [name] [address] [place], [place]  [address]  Phone: [phone]  Reason for Referral: 55 yo female with recurrent perimenopausal uterine bleeding to point of developing cardiac ischemia/injury with her anemia.  Three hospitalizations since beginning of June for this.  Has had difficulties affording some of the medication she has been sent out on to avoid bleeding (Aygestin/norethindrone).  Pt. previously followed by Dr. Crawford Givens and for short time recently, Dr. Dahlia Client for the bleeding and Dr. Dahlia Client for what appears to be ovarian cysts.  Pelvic ultrasound shows thickened endometrium for which pt. underwent endometrial biopsy earlier in past year--benign.  She is also noted to have at least 3 fibroids--please see enclosed ultrasound report.   Dr. Dahlia Client was consulted for pt. in hospital at beginning of June for the adnexal cystic lesions.  CA 125 was normal.  She felt these were likely benign ovarian cysts and could be followed every 6 months by ultrasound.  Pt. is not a good surgical candidate as well.  Pt. discharged from latest hospitalization 02/22/10 with 3 day supply of Norethindrone 5 mg tabs, 4 tabs daily, which is impossible for her to afford.  Discussed with Dr. Emeterio Reeve (much appreciated) and will be switching her to Medroxyprogesterone 10 mg daily indefinitely.  Her bleeding with this last go around ceased on 02/21/10 with the Norethindrone.    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 3)  K-TABS 10 MEQ  TBCR (POTASSIUM CHLORIDE) 1 by mouth once daily 4)  FERROUS SULFATE 325 (65 FE) MG  TBEC (FERROUS SULFATE) 1 tab by mouth two times a day 5)  HYZAAR 100-25 MG  TABS (LOSARTAN POTASSIUM-HCTZ) 1 tab by mouth daily (PA authorized x 1 year from 1.5.2011) 6)  VYTORIN 10-40 MG TABS (EZETIMIBE-SIMVASTATIN) 1 tab by mouth daily.  Preauth good through 08/2010 7)  HYDRALAZINE HCL 50 MG TABS (HYDRALAZINE HCL) two times a day 8)  DILTIAZEM HCL ER BEADS 120 MG XR24H-CAP (DILTIAZEM HCL ER BEADS) Take 1 tablet by mouth once a day 9)  ASPIRIN LOW STRENGTH 81 MG CHEW (ASPIRIN) 1 tab  by mouth daily 10)  IMDUR 60 MG XR24H-TAB (ISOSORBIDE MONONITRATE) 1 tab by mouth daily 11)  NITROGLYCERIN 0.4 MG SUBL (NITROGLYCERIN) 1 sublingual as needed chest pain.  May repeat q 5 minutes x 2 if not relieved. 12)  FUROSEMIDE 40 MG TABS (FUROSEMIDE) Take 1 qam and 1 by 2pm until swelling goes done then go back to  once daily 13)  RANEXA 500 MG XR12H-TAB (RANOLAZINE) two times a day 14)  PRODIGY BLOOD GLUCOSE MONITOR  DEVI (BLOOD GLUCOSE MONITORING SUPPL) two times a day sugar checks  250.02 15)  PRODIGY BLOOD GLUCOSE TEST  STRP (GLUCOSE BLOOD) two times a day blood sugar checks  250.02 16)  PRODIGY LANCING DEVICE  MISC (LANCET DEVICES) two times a day  blood sugar checks 250.02 17)  MONOJECT INSULIN SYRINGE 30G X 5/16" 1 ML MISC (INSULIN SYRINGE-NEEDLE U-100) two times a day insulin injections 18)  POLYETHYLENE GLYCOL 3350  POWD (POLYETHYLENE GLYCOL 3350) Dissolve 17 grams daily as needed constipation 19)  OXYCODONE HCL 5 MG TABS (OXYCODONE HCL) 1 by mouth q 6hours as needed 20)  MEDROXYPROGESTERONE ACETATE 10 MG TABS (MEDROXYPROGESTERONE ACETATE) 1 tab by mouth daily 21)  ISOPTO HYOSCINE 0.25 % SOLN (SCOPOLAMINE HBR) as directed 22)  PREDNISOLONE ACETATE 1 % SUSP (PREDNISOLONE ACETATE) as directed 23)  ARTIFICIAL TEARS  SOLN (ARTIFICIAL TEAR SOLUTION) as directed 24)  PROTONIX 40 MG TBEC (PANTOPRAZOLE SODIUM) 1 cap by mouth daily on empty stomach before breakfast.   Past Medical History: 1)  ANEMIA, IRON DEFICIENCY (ICD-280.9) 2)  HYPERTENSION, BENIGN ESSENTIAL (ICD-401.1) 3)  DIABETES MELLITUS, TYPE II (ICD-250.00)   Prior History of Blood Transfusions:   Pertinent Labs:    Thank you again for agreeing to see our patient; please contact us if you have any further questions or need additional information.  Sincerely,  Mack Hook MD

## 2010-09-01 NOTE — Letter (Signed)
Summary: *[facility] Results Follow up  [name], [place] [address]   Phone: [phone]  Fax: [phone]      04/07/2010   [place], [place]  [address]   Dear  Ms. [name],                            ____S.Drinkard,FNP   ____D. Gore,FNP       ____B. McPherson,MD   ____V. Rankins,MD    __X__E. Mulberry,MD    ____N. Hassell Done, FNP  ____D. Jobe Igo, MD    ____K. Tomma Lightning, MD    ____Other     This letter is to inform you that your recent test(s):  _______Pap Smear    ___X____Lab Test     _______X-ray    ___X____ is within acceptable limits  _______ requires a medication change  _______ requires a follow-up lab visit  _______ requires a follow-up visit with your [facility]   Comments:  Your labs were fine except for a sugar of 238.  Your A1C (testing of sugar for past 3-4 months)  however was fine at 7.4%       _________________________________________________________ If you have any questions, please contact our office                     Sincerely,  Mack Hook MD [facility]

## 2010-09-01 NOTE — Assessment & Plan Note (Signed)
Summary: RENEW MEDS//////KT   Vital Signs:  Patient profile:   55 year old female Weight:      266.7 pounds Temp:     98.2 degrees F     oral Pulse rate:   80 / minute Pulse rhythm:   regular Resp:     21 per minute BP sitting:   154 / 90  (left arm) Cuff size:   large  Vitals Entered By: Sharon Seller  (August 21, 2009 11:40 AM) CC: pt here for medication refills, pt states she has been nauseated, dizzy. Pt states she was doing a exercise class at the hospital on 08/18/2009, she had problem with her vision and hearing and her hands and feet got numb, she also had a lil pain in her chest this was during the exercise class no more than 38mins into it..  They kept checking her bp and sugar while she was there. Theyalso  heard some congestion in her left lung when they were checking her breathing. Pt needs a Rx for insulin syringes, and test stripes.  Pt states Dr. Chase Picket her cardiologist told her on 07/09/2009 to take the Isosorbide 4 times a day, 2 in the am and 2 in the pm. Dr. Vivia Birmingham also told her on 08/06/2009  to take the Furosemide 40mg  tabs twice a day 1 in the am and the second one by 2pm until the swelling in her feet and legs go down. Pt needs a new Rx for the Furosemide because she is taking the meds more and she is running out because of it, and the pharmacy wont refill the medication for her because it is too soon. Dr. Chase Picket, also told her to stop taking the Protonix  when she runs out and to start taking Nexium which she started Sunday 08/17/2009, and thats when her symptoms of s of the nausea and dizziness started and she states she feels very bloated. Is Patient Diabetic? Yes Did you bring your meter with you today? Yes Pain Assessment Patient in pain? no      CBG Result 203  Does patient need assistance? Functional Status Self care Ambulation Normal   CC:  pt here for medication refills, pt states she has been nauseated, dizzy. Pt states she was doing a  exercise class at the hospital on 08/18/2009, she had problem with her vision and hearing and her hands and feet got numb, she also had a lil pain in her chest this was during the exercise class no more than 63mins into it..  They kept checking her bp and sugar while she was there. Theyalso  heard some congestion in her left lung when they were checking her breathing. Pt needs a Rx for insulin syringes, and test stripes.  Pt states Dr. Chase Picket her cardiologist told her on 07/09/2009 to take the Isosorbide 4 times a day, 2 in the am and 2 in the pm. Dr. Vivia Birmingham also told her on 08/06/2009  to take the Furosemide 40mg  tabs twice a day 1 in the am and the second one by 2pm until the swelling in her feet and legs go down. Pt needs a new Rx for the Furosemide because she is taking the meds more and she is running out because of it, and the pharmacy wont refill the medication for her because it is too soon. Dr. Chase Picket, also told her to stop taking the Protonix  when she runs out and to start taking Nexium which she started  Sunday 08/17/2009, and and thats when her symptoms of s of the nausea and dizziness started and she states she feels very bloated.Marland Kitchen  History of Present Illness: 1.  CAD:  See above.  Dr. Rex Kras increased Isosorbide back in 12/10 as she was having some angina.  That has helped.  When she saw him in January, was having more swelling of legs--started after Christmas.  He asked her to double her dose.  She is still requiring two times a day dosing.  Pt. gives a history of not watching salt intake --  had a Whopper yesterday.  Sounds like husband, who cooks meals, has cut back on salt, but does add some to food.  Sometimes pt. does later as well.  Has been having difficulties with CHF more so with Dr. Eddie Dibbles last note.  She is medical management only with a poor progosis--diffuse Cardiovascular disease.  2.  Episode of dizziness when using hand crank exercise machine.  Hearing became muffled  and vision started going.  Was very shaky.  Sugar that morning was 144.  150 when she got to cardiac rehab.  On recheck before drank juice, sugar was 106.  Drank some juice and gradually started feeling better.  Shakiness went away.  Felt tired in chest.  Pt. states BP and HR were okay during episode.  Took about 10 minutes to resolve.  Did also have nausea with this.  Pt. has also started having nausea since with diarrhea in past 24 hours.  Pt. also states that Dr. Rex Kras switched her from Protonix to Nexium--she started the Nexium this past Sunday--has not felt right since--feels bloated.  She cannot tell me why her Protonix was switched--no problems with GERD.  Is taking Plavix, which may be the reason.  3.  DM:  sugars much improved.  Generally in 150-175 range top range.  Has an eye check next month with Dr. Zadie Rhine.  Needs a podiatrist for foot care.  Dr. Rex Kras has been following her cholesterol and that has been okay.  4.  Hypertension:  has been out of Hyzaar and was on Diovan recently until could get a refill on the former.  Currently back on Hyzaar.  Allergies (verified): No Known Drug Allergies  Physical Exam  General:  Obese, NAD Lungs:  Normal respiratory effort, chest expands symmetrically. Lungs are clear to auscultation, no crackles or wheezes. Heart:  Normal rate and regular rhythm. S1 and S2 normal without gallop, murmur, click, rub or other extra sounds.  No carotid bruits--carotids normodynamic and equal Abdomen:  Bowel sounds positive,abdomen soft and non-tender without masses, organomegaly or hernias noted. Extremities:  Pitting edema bilaterally of lower extrems, but skin not tight as pt. previously described.   Impression & Recommendations:  Problem # 1:  DIABETES MELLITUS, TYPE II, ON INSULIN, UNCONTROLLED (ICD-250.72) Control definitely improving, but not quite at goal Encouraged continued work on diet. Her updated medication list for this problem includes:     Novolog Mix 70/30 70-30 % Susp (Insulin aspart prot & aspart) .Marland Kitchen... 54 units subcutaneously two times a day before meals.  increased from 50    Hyzaar 100-25 Mg Tabs (Losartan potassium-hctz) .Marland Kitchen... 1 tab by mouth daily (pa authorized x 1 year from 1.5.2011)    Aspirin Low Strength 81 Mg Chew (Aspirin) .Marland Kitchen... 2 tabs by mouth daily  Problem # 2:  CAD (ICD-414.00) Meds changes as per Dr. Rex Kras Encouraged pt. to watch sodium more carefully. Switch to H2 blocker as she is on Plavix Her  updated medication list for this problem includes:    Metoprolol Succinate 50 Mg Xr24h-tab (Metoprolol succinate) .Marland KitchenMarland KitchenMarland KitchenMarland Kitchen 3 tabs by mouth daily    Hyzaar 100-25 Mg Tabs (Losartan potassium-hctz) .Marland Kitchen... 1 tab by mouth daily (pa authorized x 1 year from 1.5.2011)    Hydralazine Hcl 50 Mg Tabs (Hydralazine hcl) .Marland Kitchen..Marland Kitchen Two times a day    Diltiazem Hcl Er Beads 120 Mg Xr24h-cap (Diltiazem hcl er beads) .Marland Kitchen... Take 1 tablet by mouth once a day    Aspirin Low Strength 81 Mg Chew (Aspirin) .Marland Kitchen... 2 tabs by mouth daily    Isosorbide Mononitrate Cr 30 Mg Xr24h-tab (Isosorbide mononitrate) .Marland Kitchen... 2 tabs by mouth two times a day    Nitroglycerin 0.4 Mg Subl (Nitroglycerin) .Marland Kitchen... 1 sublingual as needed chest pain.  may repeat q 5 minutes x 2 if not relieved.    Furosemide 40 Mg Tabs (Furosemide) .Marland Kitchen... Take 1 qam and 1 by 2pm until swelling goes done then go back to  once daily    Ranexa 500 Mg Xr12h-tab (Ranolazine) .Marland Kitchen..Marland Kitchen Two times a day    Plavix 75 Mg Tabs (Clopidogrel bisulfate) .Marland Kitchen... Take 1 tablet by mouth once a day  Problem # 3:  DIZZINESS (ICD-780.4) Episode sounds like a vasovagal episode, though pulse and BP were fine throughout--received printout from rehab. Certainly, seemed, however, to respond to juice, though sugar apparently never below 102. To call if recurs  Problem # 4:  GERD (ICD-530.81) Switch to H2 blocker with Plavix The following medications were removed from the medication list:    Protonix 40 Mg Tbec  (Pantoprazole sodium) .Marland Kitchen... 1 tab by mouth daily Her updated medication list for this problem includes:    Famotidine 40 Mg Tabs (Famotidine) .Marland Kitchen... 1 tab by mouth at bedtime  Complete Medication List: 1)  Metoprolol Succinate 50 Mg Xr24h-tab (Metoprolol succinate) .... 3 tabs by mouth daily 2)  Novolog Mix 70/30 70-30 % Susp (Insulin aspart prot & aspart) .... 54 units subcutaneously two times a day before meals.  increased from 50 3)  K-tabs 10 Meq Tbcr (Potassium chloride) .Marland Kitchen.. 1 by mouth once daily 4)  Ferrous Sulfate 325 (65 Fe) Mg Tbec (Ferrous sulfate) .Marland Kitchen.. 1 tab by mouth daily 5)  Hyzaar 100-25 Mg Tabs (Losartan potassium-hctz) .Marland Kitchen.. 1 tab by mouth daily (pa authorized x 1 year from 1.5.2011) 6)  Vytorin 10-40 Mg Tabs (Ezetimibe-simvastatin) .... Take 1 tablet by mouth once a day (pharmacy note pa authorized for one year from 08/13/2009) 7)  Hydralazine Hcl 50 Mg Tabs (Hydralazine hcl) .... Two times a day 8)  Diltiazem Hcl Er Beads 120 Mg Xr24h-cap (Diltiazem hcl er beads) .... Take 1 tablet by mouth once a day 9)  Aspirin Low Strength 81 Mg Chew (Aspirin) .... 2 tabs by mouth daily 10)  Isosorbide Mononitrate Cr 30 Mg Xr24h-tab (Isosorbide mononitrate) .... 2 tabs by mouth two times a day 11)  Nitroglycerin 0.4 Mg Subl (Nitroglycerin) .Marland Kitchen.. 1 sublingual as needed chest pain.  may repeat q 5 minutes x 2 if not relieved. 12)  Furosemide 40 Mg Tabs (Furosemide) .... Take 1 qam and 1 by 2pm until swelling goes done then go back to  once daily 13)  Ranexa 500 Mg Xr12h-tab (Ranolazine) .... Two times a day 14)  Plavix 75 Mg Tabs (Clopidogrel bisulfate) .... Take 1 tablet by mouth once a day 15)  Famotidine 40 Mg Tabs (Famotidine) .Marland Kitchen.. 1 tab by mouth at bedtime 16)  Prodigy Blood Glucose Monitor Devi (Blood glucose  monitoring suppl) .... Two times a day sugar checks 17)  Prodigy Blood Glucose Test Strp (Glucose blood) .... Two times a day blood sugar checks 18)  Prodigy Lancing Device Misc  (Lancet devices) .... Two times a day blood sugar checks 19)  Monoject Insulin Syringe 30g X 5/16" 1 Ml Misc (Insulin syringe-needle u-100) .... Two times a day insulin injections  Other Orders: Capillary Blood Glucose/CBG [id number])  Patient Instructions: 1)  Stop Nexium 2)  Follow up with Dr. Amil Amen in 1 month --nausea and dizziness Prescriptions: MONOJECT INSULIN SYRINGE 30G X 5/16" 1 ML MISC (INSULIN SYRINGE-NEEDLE U-100) two times a day insulin injections  #100 x 11   Entered and Authorized by:   Mack Hook MD   Signed by:   Mack Hook MD on 08/21/2009   Method used:   Electronically to        [facility] [address] (retail)       [place], [place]  [address]       Ph: [phone]       Fax: [phone]   RxID:   [id number] FUROSEMIDE 40 MG TABS (FUROSEMIDE) Take 1 qam and 1 by 2pm until swelling goes done then go back to  once daily  #60 x 11   Entered and Authorized by:   Mack Hook MD   Signed by:   Mack Hook MD on 08/21/2009   Method used:   Electronically to        [facility] [address] (retail)       [place], [place]  [address]       Ph: [phone]       Fax: [phone]   RxID:   [id number] ISOSORBIDE MONONITRATE CR 30 MG XR24H-TAB (ISOSORBIDE MONONITRATE) 2 tabs by mouth two times a day  #120 x 11   Entered and Authorized by:   Mack Hook MD   Signed by:   Mack Hook MD on 08/21/2009   Method used:   Electronically to        [facility] [address] (retail)       [address]       [place], [place]  [address]       Ph: [phone]       Fax: [phone]   RxID:   [id number] PRODIGY LANCING DEVICE  MISC (LANCET DEVICES) two times a day blood sugar checks  #100 x 11   Entered and Authorized by:   Mack Hook MD   Signed by:   Mack Hook MD on 08/21/2009   Method used:   Electronically  to        [facility] [address] (retail)       [address]       [place], [place]  [address]       Ph: [phone]       Fax: [phone]   RxID:   [id number] PRODIGY BLOOD GLUCOSE TEST  STRP (GLUCOSE BLOOD) two times a day blood sugar checks  #100 x 11   Entered and Authorized by:   Mack Hook MD   Signed by:   Mack Hook MD on 08/21/2009   Method used:   Electronically to  [facility] (retail)       [address]       [place], [place]  [address]       Ph: [phone]       Fax: [phone]   RxID:   [id number] PRODIGY BLOOD GLUCOSE MONITOR  DEVI (BLOOD GLUCOSE MONITORING SUPPL) two times a day sugar checks  #1 x 0   Entered and Authorized by:   Mack Hook MD   Signed by:   Mack Hook MD on 08/21/2009   Method used:   Electronically to        [facility] [address] (retail)       [address]       [place], [place]  [address]       Ph: [phone]       Fax: [phone]   RxID:   [id number] METOPROLOL SUCCINATE 50 MG XR24H-TAB (METOPROLOL SUCCINATE) 3 tabs by mouth daily  #90 x 11   Entered and Authorized by:   Mack Hook MD   Signed by:   Mack Hook MD on 08/21/2009   Method used:   Electronically to        [facility] [address] (retail)       [address]       [place], [place]  [address]       Ph: [phone]       Fax: [phone]   RxID:   [id number] FAMOTIDINE 40 MG TABS (FAMOTIDINE) 1 tab by mouth at bedtime  #30 x 11   Entered and Authorized by:   Mack Hook MD   Signed by:   Mack Hook MD on 08/21/2009   Method used:   Electronically to        [facility] [address] (retail)       [address]       [place], [place]  [address]       Ph: [phone]       Fax: [phone]   RxID:   [id number]    Vital Signs:  Patient profile:   55 year old  female Weight:      266.7 pounds Temp:     98.2 degrees F     oral Pulse rate:   80 / minute Pulse rhythm:   regular Resp:     21 per minute BP sitting:   154 / 90  (left arm) Cuff size:   large  Vitals Entered By: Sharon Seller  (August 21, 2009 11:40 AM)    Laboratory Results   Blood Tests   Date/Time Received: August 21, 2009 12:23 PM   HGBA1C: 7.4%   (Normal Range: Non-Diabetic - 3-6%   Control Diabetic - 6-8%) CBG Random:: 203mg /dL

## 2010-09-01 NOTE — Letter (Signed)
Summary: *Referral Letter  [facility]  [address] [place], [place] [address]   Phone: [phone]  Fax: [phone]    04/07/2010  Dr. Chase Picket [facility]  Re:  [name]        [address]        [place], [place]  [address]  Phone: [phone]  Dear Dr. Rex Kras:  [name] was in for her physical last week.  I noted that she has not had a screening colonoscopy at the time.  My question to you is whether you feel she would tolerate undergoing such a procedure at this time with her current cardiovascular status.    Thanks for your attention to this.  Sincerely,  Mack Hook, M.D.

## 2010-09-01 NOTE — Progress Notes (Signed)
Summary: Advised to go to ED    Phone Note Call from Patient Call back at [phone]   Summary of Call: when the pt went to [facility] she saw a Dr Dory Peru ( and the provider at there told her that she is starting the menopause stage) and she is bleeding, feeling weaker, lossing appetite and under a lot of pain.  At the hospital the provider prescribed her a medication for six days that help her which is called (PRODERA MADROXYPROTESTERONE 10 MG). MULBERRY MD  Initial call taken by: [name],  February 05, 2010 11:20 AM  Follow-up for Phone Call        States she went to [facility] last Friday.  She is bleeding heavily, clotting, cramping with low back pain.  Has headache, shaking, her body feels weak and heavy.  States that she has been taking Tylenol and Pamprin, which have been ineffective; says only the Provera was effective, but they only gave her six days of meds and she's trying to avoid going to the ED.  [name] RN  February 05, 2010 12:09 PM   Additional Follow-up for Phone Call Additional follow up Details #1::        Records indicate she is followed by Dr. Charlesetta Garibaldi (GYN) at [place] at [facility] for this same problem.  She was in hospital last month and had to have Plavix stopped due to this.  She has CAD and is followed by cardiology. She needs to speak to someone at the GYN clinic today and follow their directions.  If she cannot speak to someone, she should go to the ED.  I did speak to patient.  She did call Dr. Berneta Sages office.  Not sure why, but she was told to follow up with [name] for this problem.  She states she was seen at the [facility] last week to r/o malignancy.  She was told she does not have cancer.  I spoke to the patient and she is having increasing fatigue and dyspnea.  No chest pain.  No syncope.  She does feel lightheaded when she stands.  She took provera last month after being  in the hospital . .  . Rx by Dr. Charlesetta Garibaldi.  It slowed her  menses down.  But, she started bleeding again when she finished and has bleed since last month.  Has heavy clots, cramping.  Notes back pain.  Advised patient to go to the emergency room in light of her symptoms and her comorbidities.  She agrees to get in touch with her husband or call 911 for an ambulance. Additional Follow-up by: [name] PA-C,  February 05, 2010 12:38 PM

## 2010-09-01 NOTE — Progress Notes (Signed)
Summary: TAKEN TO [name] FOR HEAT/COULDNT GET [name]  Phone Note Call from Patient Call back at Home Phone [phone]   Reason for Call: Talk to Nurse Summary of Call: [name] PT. MS Helfrich WANTS TO KNOW SHE W[facility] FOR HER MAMOGRAM AND SHE GOT SICK AND EVERTHING WENT BLACK, SHE COULDNT SEE, HEAR OR WALK, AND THEY CALLED EMS AND TOOK HER TO [name]. SHE SAYS THAT REALLY DIDN'T FIND ANYTHING BUT THEY WERE CONCERNED BECAUSE SHE HAD PRESSURE IN HER CHEST AND SHE WAS VERY WEAK, ARMS NECK AND FEET WERE ACHING AND HER ANKLES WERE SWOLLEN LIKE BALLOONS. THEY WANTED HER TO LET YOU KNOW WHAT HAPPENED, AND TO LET YOU KNOWNTHAT SHE IS STILL BLEEDING, AND WE NEED TO RESCHED HER MAMOGRAM.  HUSBAND SAYS THAT SHE NEEDS ANOTHER REFILL ON MEDROXYPR 5MG . AND WILL NEED REFILLS SENT TO [name]. Initial call taken by: Roberto Scales,  May 01, 2010 2:24 PM  Follow-up for Phone Call        FWD TO PROVIDER NOTES FROM E CHART ON DESK ALSO MAMMOGRAM WAS DONE ON 9/28 Follow-up by: Rhunette Croft,  May 01, 2010 5:12 PM  Additional Follow-up for Phone Call Additional follow up Details #1::        Hgb mildly low at 11.1 in ED.  No significant findings. Did she go to the gyn appt. on the 29th of Sept?   What was decided regarding her bleeding? Need records from her gyn clinic visit.  Please call and ask for those. Additional Follow-up by: Elizabeth Hook MD,  May 06, 2010 1:37 PM    Additional Follow-up for Phone Call Additional follow up Details #2::    pt did not go to GYN appt. husband had appt at anotherdoctor office same day so pt will reschedule.  during mammogram appt. pt fell weak, arms and legs went heavy, chest was heavy, and could not breathe. Seen by rapid response team sent to [facility] for evaluation. advised to contact cardiology. PT HAS NOT CALLED TO SCHEDULE APPT YET.  DURING ED VISIT MEDROXYPROGESTERONE WAS INCREASED TO 4 TABS DAILY. NEED  RX BEFORE MONDAY. BLEEDING HAS DECREASED TO SPOTTING ON NEW DOSE AND PT FEELS MUCH BETTER  USES Graham Follow-up by: Rhunette Croft,  May 07, 2010 11:25 AM  Additional Follow-up for Phone Call Additional follow up Details #3:: Details for Additional Follow-up Action Taken: Need to know when her new gyn appt. is. She needs to call her cardiologist and be seen in follow up there as well. This continues to be put off I will not continue to refill the medroxyprogesterone if she continues to reschedule or if she misses the appt.  I do not want to continue her on a high dose of medroxy progesterone.Elizabeth Hook MD  May 11, 2010 10:17 AM   LEFT MESSAGE FOR PT TO RETURN CALL Rhunette Croft  May 11, 2010 1:02 PM    GYN APPT 05/28/10 @ [name] 2011 AT PTS REQUEST RX SENT TO [facility] rX CANCELLED AT [place]  May 13, 2010 9:17 AM     Prescriptions: MEDROXYPROGESTERONE ACETATE 10 MG TABS (MEDROXYPROGESTERONE ACETATE) 1 tab by mouth daily  #30 x 0   Entered by:   Rhunette Croft   Authorized by:   Elizabeth Hook MD   Signed by:   Rhunette Croft on 05/13/2010   Method used:   [facility] (retail)       [place], [place]  [address]       Ph: [phone] [place]       Fax: [phone]   RxID:   [id number] MEDROXYPROGESTERONE ACETATE 10 MG TABS (MEDROXYPROGESTERONE ACETATE) 1 tab by mouth daily  #30 x 0   Entered and Authorized by:   Elizabeth Hook MD   Signed by:   Elizabeth Hook MD on 05/11/2010   Method used:   Electronically to        [facility] [address] (retail)       [address]       [place], [place]  [address]       Ph: [phone]       Fax: [phone]   RxID:   [id number]

## 2010-09-01 NOTE — Progress Notes (Signed)
Summary: Toprol Changed/Refill  Phone Note From Pharmacy   Caller: Alva Reason for Call: Needs renewal Summary of Call: Toprol dose changed to 200 mg once daily during July 21 admission to [facility]. Rx adjusted and sent to [facility] pharmacy Initial call taken by: Versie Starks,  April 15, 2010 8:38 AM    New/Updated Medications: METOPROLOL SUCCINATE 200 MG XR24H-TAB (METOPROLOL SUCCINATE) Take 1 tablet by mouth once a day Prescriptions: METOPROLOL SUCCINATE 200 MG XR24H-TAB (METOPROLOL SUCCINATE) Take 1 tablet by mouth once a day  #30 x 3   Entered and Authorized by:   [name] PA-C   Signed by:   [name] PA-C on 04/15/2010   Method used:   Faxed to ...       [facility] (retail)       [address] [place], [place]  [address]       Ph: [phone]       Fax: [phone]   RxID:   [id number]

## 2010-09-01 NOTE — Letter (Signed)
Summary: *HSN Results Follow up  [place], [place] [address]   Phone: [phone]  Fax: [phone]      03/19/2010   [address] [place], [place]  [address]   Dear  Ms. Pinki Rayborn,                            ____S.Drinkard,FNP   ____D. Gore,FNP       ____B. McPherson,MD   ____V. Rankins,MD    __X__E. Mulberry,MD    ____N. Hassell Done, FNP  ____D. Jobe Igo, MD    ____K. Tomma Lightning, MD    ____Other     This letter is to inform you that your recent test(s):  _______Pap Smear    _______Lab Test     ___X____X-ray    _______ is within acceptable limits  _______ requires a medication change  _______ requires a follow-up lab visit  _______ requires a follow-up visit with your provider   Comments:   Ultrasound of your pelvis shows the fibroids and the thick inside lining of uterus, though the last part is a bit better.  We'll see what they say.       _________________________________________________________ If you have any questions, please contact our office                     Sincerely,  Mack Hook MD [facility]

## 2010-09-01 NOTE — Assessment & Plan Note (Signed)
Summary: hospital follow up/blood transfussion//gk   Vital Signs:  Patient profile:   55 year old female Weight:      265 pounds BMI:     47.11 Temp:     98.0 degrees F Pulse rate:   76 / minute Pulse rhythm:   122regular Resp:     20 per minute BP sitting:   122 / 78  (left arm) Cuff size:   large  Vitals Entered By: Shellia Carwin CMA (February 24, 2010 12:14 PM) CC: Hosp f/u for vag bleeding and cramping was in hosp twice.  Per pt she was taken off plavix Is Patient Diabetic? Yes Pain Assessment Patient in pain? no      CBG Result 103  Does patient need assistance? Ambulation Impaired:Risk for fall   CC:  Hosp f/u for vag bleeding and cramping was in hosp twice.  Per pt she was taken off plavix.  History of Present Illness: 1.  Heavy uterine bleeding:  Continues to have problems with this.  Was to start Aygestin, but was over 300 dollars per month, not affordable.  Pt. has been hospitalized June 9, July 7 and July 21 for uterine bleeding and anemia.  The first precipitated a non Q wave MI.  Has been seen by Dr. Charlesetta Garibaldi and a Dr. Skeet Latch  (Heme/Onc).  Dr. Skeet Latch involved secondary to Ovarian masses--felt likely to be benign cysts.  Has been discharged from Dr. Berneta Sages office secondary to inability to pay her bills there.  Pt. states she was told by Dr. Skeet Latch that she did not need to come back to her office as she "is just going through menopause.  Pt. was started on Norethindrone 5 mg 4 tabs daily with last hospitalization --bleeding stopped 3 days ago with this regimen.  Has enough Norethindrone for 2 more days.  She did not receive the 10 days supply as dictated in discharge summary.  She does not have a gynecologist with whom to follow up at this time.  Has had 7 units prbcs per pt. since June hospitalization.  2.  DM:  Sugars running low to mid 100s.  A1C was 7.1 % with recent hospitalization.  On Novolog mix 60 units two times a day and taking Glipizide 5 mg two times a  day --but on lots of meds.    3.  CAD:  no current problems with chest pain--as long as keeping hgb up, seems to do okay.  Current Medications (verified): 1)  Metoprolol Succinate 50 Mg Xr24h-Tab (Metoprolol Succinate) .... 3 Tabs By Mouth Daily 2)  Novolog Mix 70/30 70-30 %  Susp (Insulin Aspart Prot & Aspart) .... 54 Units Subcutaneously Two Times A Day Before Meals.  Increased From 50 3)  K-Tabs 10 Meq  Tbcr (Potassium Chloride) .Marland Kitchen.. 1 By Mouth Once Daily 4)  Ferrous Sulfate 325 (65 Fe) Mg  Tbec (Ferrous Sulfate) .Marland Kitchen.. 1 Tab By Mouth Daily 5)  Hyzaar 100-25 Mg  Tabs (Losartan Potassium-Hctz) .Marland Kitchen.. 1 Tab By Mouth Daily (Pa Authorized X 1 Year From 1.5.2011) 6)  Vytorin 10-40 Mg Tabs (Ezetimibe-Simvastatin) .Marland Kitchen.. 1 Tab By Mouth Daily.  Preauth Good Through 08/2010 7)  Hydralazine Hcl 50 Mg Tabs (Hydralazine Hcl) .... Two Times A Day 8)  Diltiazem Hcl Er Beads 120 Mg Xr24h-Cap (Diltiazem Hcl Er Beads) .... Take 1 Tablet By Mouth Once A Day 9)  Aspirin Low Strength 81 Mg Chew (Aspirin) .... 2 Tabs By Mouth Daily 10)  Imdur 60 Mg Xr24h-Tab (Isosorbide Mononitrate) .Marland KitchenMarland KitchenMarland Kitchen 1  Tab By Mouth Daily 11)  Nitroglycerin 0.4 Mg Subl (Nitroglycerin) .Marland Kitchen.. 1 Sublingual As Needed Chest Pain.  May Repeat Q 5 Minutes X 2 If Not Relieved. 12)  Furosemide 40 Mg Tabs (Furosemide) .... Take 1 Qam and 1 By 2pm Until Swelling Goes Done Then Go Back To  Once Daily 13)  Ranexa 500 Mg Xr12h-Tab (Ranolazine) .... Two Times A Day 14)  Plavix 75 Mg Tabs (Clopidogrel Bisulfate) .... Take 1 Tablet By Mouth Once A Day 15)  Famotidine 40 Mg Tabs (Famotidine) .Marland Kitchen.. 1 Tab By Mouth At Bedtime 16)  Prodigy Blood Glucose Monitor  Devi (Blood Glucose Monitoring Suppl) .... Two Times A Day Sugar Checks  250.02 17)  Prodigy Blood Glucose Test  Strp (Glucose Blood) .... Two Times A Day Blood Sugar Checks  250.02 18)  Prodigy Lancing Device  Misc (Lancet Devices) .... Two Times A Day Blood Sugar Checks 250.02 19)  Monoject Insulin Syringe 30g X  5/16" 1 Ml Misc (Insulin Syringe-Needle U-100) .... Two Times A Day Insulin Injections 20)  Polyethylene Glycol 3350  Powd (Polyethylene Glycol 3350) .... Dissolve 17 Grams Daily As Needed Constipation 21)  Glipizide 5 Mg Tabs (Glipizide) .Marland Kitchen.. 1 By Mouth Two Times A Day 22)  Oxycodone Hcl 5 Mg Tabs (Oxycodone Hcl) .Marland Kitchen.. 1 By Mouth Q 6hours As Needed 23)  Norethindrone Acetate 5 Mg Tabs (Norethindrone Acetate) .... 4 By Mouth Qam 24)  Isopto Hyoscine 0.25 % Soln (Scopolamine Hbr) .... As Directed 25)  Prednisolone Acetate 1 % Susp (Prednisolone Acetate) .... As Directed 26)  Artificial Tears  Soln (Artificial Tear Solution) .... As Directed  Allergies (verified): No Known Drug Allergies  Physical Exam  General:  NAD Lungs:  Normal respiratory effort, chest expands symmetrically. Lungs are clear to auscultation, no crackles or wheezes. Heart:  Normal rate and regular rhythm. S1 and S2 normal without gallop, murmur, click, rub or other extra sounds.  Radial pulses normal and equal   Impression & Recommendations:  Problem # 1:  MENORRHAGIA, PERIMENOPAUSAL (ICD-626.8) Pt. with fibroids, thickened endometrium, with recent hx of benign endometrial biopsy Discussed progesterone with Dr. Emeterio Reeve[facility]--he felt she would do fine on Medroxy progesterone 10 mgd daily and to keep her on that.  Northindrone 5 mg runs $375 per month for 120 pills.  Not available in that strength at [facility] and [facility] pharmacies. With concern for ovarian masses, felt to be benign cysts by Dr. Dahlia Client, will also send pt. to [facility]. Orders: Gynecologic Referral (Gyn)  Problem # 2:  CAD (ICD-414.00) Currently stable as long as keep hgb from dropping. The following medications were removed from the medication list:    Plavix 75 Mg Tabs (Clopidogrel bisulfate) .Marland Kitchen... Take 1 tablet by mouth once a day Her updated medication list for this problem includes:    Metoprolol Succinate 50  Mg Xr24h-tab (Metoprolol succinate) .Marland KitchenMarland KitchenMarland KitchenMarland Kitchen 3 tabs by mouth daily    Hyzaar 100-25 Mg Tabs (Losartan potassium-hctz) .Marland Kitchen... 1 tab by mouth daily (pa authorized x 1 year from 1.5.2011)    Hydralazine Hcl 50 Mg Tabs (Hydralazine hcl) .Marland Kitchen..Marland Kitchen Two times a day    Diltiazem Hcl Er Beads 120 Mg Xr24h-cap (Diltiazem hcl er beads) .Marland Kitchen... Take 1 tablet by mouth once a day    Aspirin Low Strength 81 Mg Chew (Aspirin) .Marland Kitchen... 1 tab  by mouth daily    Imdur 60 Mg Xr24h-tab (Isosorbide mononitrate) .Marland Kitchen... 1 tab by mouth daily    Nitroglycerin 0.4  Mg Subl (Nitroglycerin) .Marland Kitchen... 1 sublingual as needed chest pain.  may repeat q 5 minutes x 2 if not relieved.    Furosemide 40 Mg Tabs (Furosemide) .Marland Kitchen... Take 1 qam and 1 by 2pm until swelling goes done then go back to  once daily    Ranexa 500 Mg Xr12h-tab (Ranolazine) .Marland Kitchen..Marland Kitchen Two times a day  Problem # 3:  DIABETES MELLITUS, TYPE II, ON INSULIN, UNCONTROLLED (ICD-250.72) Good control for this pt. Stop Glipizide for now and see if can control with just the insulin. Her updated medication list for this problem includes:    Novolog Mix 70/30 70-30 % Susp (Insulin aspart prot & aspart) .Marland KitchenMarland KitchenMarland KitchenMarland Kitchen 60 units subcutaneously two times a day before meals.    Hyzaar 100-25 Mg Tabs (Losartan potassium-hctz) .Marland Kitchen... 1 tab by mouth daily (pa authorized x 1 year from 1.5.2011)    Aspirin Low Strength 81 Mg Chew (Aspirin) .Marland Kitchen... 1 tab  by mouth daily  Complete Medication List: 1)  Metoprolol Succinate 50 Mg Xr24h-tab (Metoprolol succinate) .... 3 tabs by mouth daily 2)  Novolog Mix 70/30 70-30 % Susp (Insulin aspart prot & aspart) .... 60 units subcutaneously two times a day before meals. 3)  K-tabs 10 Meq Tbcr (Potassium chloride) .Marland Kitchen.. 1 by mouth once daily 4)  Ferrous Sulfate 325 (65 Fe) Mg Tbec (Ferrous sulfate) .Marland Kitchen.. 1 tab by mouth two times a day 5)  Hyzaar 100-25 Mg Tabs (Losartan potassium-hctz) .Marland Kitchen.. 1 tab by mouth daily (pa authorized x 1 year from 1.5.2011) 6)  Vytorin 10-40 Mg Tabs  (Ezetimibe-simvastatin) .Marland Kitchen.. 1 tab by mouth daily.  preauth good through 08/2010 7)  Hydralazine Hcl 50 Mg Tabs (Hydralazine hcl) .... Two times a day 8)  Diltiazem Hcl Er Beads 120 Mg Xr24h-cap (Diltiazem hcl er beads) .... Take 1 tablet by mouth once a day 9)  Aspirin Low Strength 81 Mg Chew (Aspirin) .Marland Kitchen.. 1 tab  by mouth daily 10)  Imdur 60 Mg Xr24h-tab (Isosorbide mononitrate) .Marland Kitchen.. 1 tab by mouth daily 11)  Nitroglycerin 0.4 Mg Subl (Nitroglycerin) .Marland Kitchen.. 1 sublingual as needed chest pain.  may repeat q 5 minutes x 2 if not relieved. 12)  Furosemide 40 Mg Tabs (Furosemide) .... Take 1 qam and 1 by 2pm until swelling goes done then go back to  once daily 13)  Ranexa 500 Mg Xr12h-tab (Ranolazine) .... Two times a day 14)  Prodigy Blood Glucose Monitor Devi (Blood glucose monitoring suppl) .... Two times a day sugar checks  250.02 15)  Prodigy Blood Glucose Test Strp (Glucose blood) .... Two times a day blood sugar checks  250.02 16)  Prodigy Lancing Device Misc (Lancet devices) .... Two times a day blood sugar checks 250.02 17)  Monoject Insulin Syringe 30g X 5/16" 1 Ml Misc (Insulin syringe-needle u-100) .... Two times a day insulin injections 18)  Polyethylene Glycol 3350 Powd (Polyethylene glycol 3350) .... Dissolve 17 grams daily as needed constipation 19)  Oxycodone Hcl 5 Mg Tabs (Oxycodone hcl) .Marland Kitchen.. 1 by mouth q 6hours as needed 20)  Medroxyprogesterone Acetate 10 Mg Tabs (Medroxyprogesterone acetate) .Marland Kitchen.. 1 tab by mouth daily 21)  Isopto Hyoscine 0.25 % Soln (Scopolamine hbr) .... As directed 22)  Prednisolone Acetate 1 % Susp (Prednisolone acetate) .... As directed 23)  Artificial Tears Soln (Artificial tear solution) .... As directed 24)  Protonix 40 Mg Tbec (Pantoprazole sodium) .Marland Kitchen.. 1 cap by mouth daily on empty stomach before breakfast.  Other Orders: Capillary Blood Glucose/CBG [id number])  Patient Instructions: 1)  Follow up with Dr. Amil Amen in 2 months--follow up on  anemia Prescriptions: MEDROXYPROGESTERONE ACETATE 10 MG TABS (MEDROXYPROGESTERONE ACETATE) 1 tab by mouth daily  #30 x 2   Entered and Authorized by:   Mack Hook MD   Signed by:   Mack Hook MD on 02/24/2010   Method used:   Faxed to ...       [facility] (retail)       [place], [place]  [address]       Ph: [phone]       Fax: [phone]   RxID:   [id number] PROTONIX 40 MG TBEC (PANTOPRAZOLE SODIUM) 1 cap by mouth daily on empty stomach before breakfast.  #30 x 11   Entered and Authorized by:   Mack Hook MD   Signed by:   Mack Hook MD on 02/24/2010   Method used:   Faxed to ...       [facility] (retail)       [place], [place]  [address]       Ph: [phone] [place]       Fax: [phone]   RxID:   [id number] IMDUR 60 MG XR24H-TAB (ISOSORBIDE MONONITRATE) 1 tab by mouth daily  #30 x 11   Entered and Authorized by:   Mack Hook MD   Signed by:   Mack Hook MD on 02/24/2010   Method used:   Faxed to ...       [facility] (retail)       [place], [place]  [address]       Ph: [phone] [place]       Fax: [phone]   RxID:   [id number] FUROSEMIDE 40 MG TABS (FUROSEMIDE) Take 1 qam and 1 by 2pm until swelling goes done then go back to  once daily  #60 x 11   Entered and Authorized by:   Mack Hook MD   Signed by:   Mack Hook MD on 02/24/2010   Method used:   Faxed to ...       [facility] (retail)       [place], [place]  [address]       Ph: [phone]       Fax: [phone]   RxID:   [id number] ASPIRIN LOW STRENGTH 81 MG CHEW (ASPIRIN) 1 tab  by mouth daily  #30 x 11   Entered and Authorized by:   Mack Hook MD   Signed by:   Mack Hook MD  on 02/24/2010   Method used:   Faxed to ...       [facility] (retail)       [place], [place]  [address]       Ph: [phone]       Fax: [phone]   RxID:   [id number] DILTIAZEM HCL ER BEADS 120 MG XR24H-CAP (DILTIAZEM HCL ER BEADS) Take 1 tablet by mouth once a day  #30 x 11   Entered and Authorized by:   Mack Hook MD   Signed by:   Mack Hook MD on 02/24/2010   Method used:   Faxed to ...       [facility] (retail)       [place], [place]  [address]       Ph: [phone] [place]       Fax: [phone]   RxID:   [id number] HYDRALAZINE HCL 50 MG TABS (HYDRALAZINE HCL) two times a day  #60 x 11   Entered and Authorized by:   Mack Hook MD   Signed by:   Mack Hook MD on 02/24/2010   Method used:   Faxed to ...       [facility] (retail)       [place], [place]  [address]       Ph: [phone] [place]       Fax: [phone]   RxID:   [id number] HYZAAR 100-25 MG  TABS (LOSARTAN POTASSIUM-HCTZ) 1 tab by mouth daily (PA authorized x 1 year from 1.5.2011)  #30 x 11   Entered and Authorized by:   Mack Hook MD   Signed by:   Mack Hook MD on 02/24/2010   Method used:   Faxed to ...       [facility] (retail)       [place], [place]  [address]       Ph: [phone]       Fax: [phone]   RxID:   [id number] FERROUS SULFATE 325 (65 FE) MG  TBEC (FERROUS SULFATE) 1 tab by mouth two times a day  #60 x 4   Entered and Authorized by:   Mack Hook MD   Signed by:   Mack Hook MD on 02/24/2010   Method used:   Faxed to ...       [facility] (retail)       [place], [place]  [address]       Ph: [phone] [place]       Fax:  [phone]   RxID:   [id number] K-TABS 10 MEQ  TBCR (POTASSIUM CHLORIDE) 1 by mouth once daily  #30 x 11   Entered and Authorized by:   Mack Hook MD   Signed by:   Mack Hook MD on 02/24/2010   Method used:   Faxed to ...       [facility] (retail)       [place], [place]  [address]       Ph: [phone] [place]       Fax: [phone]   RxID:   [id number] METOPROLOL SUCCINATE 50 MG XR24H-TAB (METOPROLOL SUCCINATE) 3 tabs by mouth daily  #90 x 11   Entered and Authorized by:   Mack Hook MD   Signed by:   Mack Hook MD on 02/24/2010   Method used:   Faxed to ...       [facility] (retail)       [place], [place]  [address]       Ph: [phone] [place]       Fax: [phone]   RxID:   [id number] NOVOLOG MIX 70/30 70-30 %  SUSP (INSULIN ASPART PROT & ASPART) 60 units subcutaneously two times a day before meals.  #1 month x 11   Entered  and Authorized by:   Mack Hook MD   Signed by:   Mack Hook MD on 02/24/2010   Method used:   Faxed to ...       [facility] (retail)       [place], [place]  [address]       Ph: [phone]       Fax: [phone]   RxID:   [id number]

## 2010-09-01 NOTE — Letter (Signed)
Summary: [facility]   Imported By: Roland Earl 09/29/2009 10:42:11  _____________________________________________________________________  External Attachment:    Type:   Image     Comment:   External Document

## 2010-09-01 NOTE — Letter (Signed)
Summary: REFERRAL/GYNECOLOGIC//APPT DATE & TIME  REFERRAL/GYNECOLOGIC//APPT DATE & TIME   Imported By: Roland Earl 03/31/2010 12:10:41  _____________________________________________________________________  External Attachment:    Type:   Image     Comment:   External Document

## 2010-09-01 NOTE — Progress Notes (Signed)
Summary: Coming in for office visit s/p ED visit   Phone Note Call from Patient Call back at [phone]   Summary of Call: The pt wants to say Thank you to [name] for the great support he gave her and also she shared that she went to the ER on Thursday and they have done differnt test on her and she is wondered if she still needs to come for her physical exam tomorrow. [name] Initial call taken by: [name],  February 09, 2010 9:19 AM  Follow-up for Phone Call        Advised to keep scheduled appt. since she did go to ED last week and in light of all treatments and tests, to update with her provider.  Follow-up by: [name] RN,  February 09, 2010 9:37 AM  Additional Follow-up for Phone Call Additional follow up Details #1::        Pt. cancelled appt. this morning. Additional Follow-up by: [name],  February 10, 2010 9:27 AM

## 2010-09-01 NOTE — Assessment & Plan Note (Signed)
Summary: 4 MONTH FU FOR / ANEMIA/CPP////KT   Vital Signs:  Patient profile:   55 year old female Height:      63 inches Weight:      273 pounds Temp:     98.0 degrees F oral Pulse rate:   72 / minute Pulse rhythm:   regular Resp:     20 per minute BP sitting:   148 / 90  (left arm) Cuff size:   large  Vitals Entered By: Rhunette Croft (April 03, 2010 10:11 AM) CC: pt presents to clinic for CPP.  no complaints today Is Patient Diabetic? Yes Pain Assessment Patient in pain? no      CBG Result 276 CBG Device ID [id number]  Does patient need assistance? Functional Status Self care Ambulation Impaired:Risk for fall Comments pt uses "blind cane" to assist with ambulation   CC:  pt presents to clinic for CPP.  no complaints today.  History of Present Illness: 55 yo female here for CPP.  1.  Vaginal bleeding:  Pt. states she is only taking one tab of medroxyprogesterone daily--she should be taking 2 tabs as she has 5 mg tabs.  She thinks she is intermittently having vaginal bleeding.  She states she also uses Pamprin and that seems to help as well.  Her vision is so poor currently, that she cannot tell if she is bleeding.    2.  Eye concerns:  planning for more surgery on her left eye with Dr Zadie Rhine.  Poor vision on right.  3.  Toe bleeding on right foot-2nd toe.  Cannot remember if cut toe or not.  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 D&C x 2 for menometrorrhagia Left salpingo-oophorectom secondar to ovarian cyst Bilateral Cataract excision--Dr. Radene Ou Multple laser surgeries to bilateral eyes--Dr. Radene Ou  Family History: Mother, 43:  Hypertension, DM with complications, hypercholesterolemia, bedridden--morbildy obese. Father, died 37:  CHF, CAD, Alzheimer's Dementia, DM, hypercholesterolemia, hypertension 1 Brother, 48:  CAD before age 49, Hypertension, DM, hypercholesterolemia 2 Sisters, 39:  hypertension, DM, hypercholesterolemia                  24:  DM, hypercholesterolemia, anemia, hypertension No children  Social History: Married for 46 years--husband was in the [employer] Lives at home with husband and cat. Never Smoked Alcohol use-no Drug use-never  Review of Systems General:  Energy poor. Eyes:  See HPI--poor with diabetic complications.. ENT:  Denies decreased hearing. CV:  No chest pain since last hospitalization in July. Resp:  Occasional short lived SOB. GI:  Denies bloody stools, dark tarry stools, and diarrhea; Pelvic cramping with vaginal bleeding. Constipated with iron intake.  Tries to eat a lot of fruits and vegetables.. GU:  Denies dysuria; Intermittent uterine bleeding. Does have a vaginal discharge that has an odor--some itching/irritation.. MS:  Bilateral knee pain--history of injuries in past.   Left shoulder pain. Derm:  Denies rash; skin tags on neck and inner left thigh.. Neuro:  Denies numbness, tingling, and weakness. Psych:  Denies depression and suicidal  thoughts/plans.  Physical Exam  General:  Morbidly obese, NAD.  Wearing an eye cup over left eye. Head:  Normocephalic and atraumatic without obvious abnormalities. No apparent alopecia or balding. Eyes:  No red reflex on left--dense changes behind pupil-unable to see disc.  No injection.  Both pupils a bit  distorted--right quite dilated as well, from previous surgery.  EOMI Ears:  External ear exam shows no significant lesions or deformities.  Otoscopic examination reveals clear canals, tympanic membranes are intact bilaterally without bulging, retraction, inflammation or discharge. Hearing is grossly normal bilaterally. Nose:  External nasal examination shows no deformity or inflammation. Nasal mucosa are pink and moist without lesions or exudates. Mouth:  Oral mucosa and oropharynx without lesions or exudates.  Edentulous Neck:  No deformities, masses, or tenderness noted. Breasts:  No mass, nodules, thickening, tenderness, bulging, retraction, inflamation, nipple discharge or skin changes noted.   Lungs:  Normal respiratory effort, chest expands symmetrically. Lungs are clear to auscultation, no crackles or wheezes. Heart:  Normal rate and regular rhythm. S1 and S2 normal without gallop, murmur, click, rub or other extra sounds. Abdomen:  Morbildy obese, which limits examsoft, non-tender, normal bowel sounds, no masses appreciated, no hepatomegaly, and no splenomegaly.  soft, non-tender, normal bowel sounds, no masses, no hepatomegaly, and no splenomegaly.   Rectal:  No external abnormalities noted. Normal sphincter tone. No rectal masses or tenderness.  Heme negative light brown stool. Genitalia:  Pelvic Exam:        External: normal female genitalia without lesions or masses        Vagina: Mild amt of old blood in vaginal canal.  No odor.   normal without lesions or masses otherwise.        Cervix: normal without lesions or masses        Adnexa: normal bimanual exam without masses or fullness        Uterus: normal by palpation        Pap smear: performed Msk:  No deformity or scoliosis noted of thoracic or lumbar spine.   Pulses:  R and L carotid,radial,femoral,dorsalis pedis and posterior tibial pulses are full and equal bilaterally Extremities:  moderate pitting edema bilateral legs to  knees. Right 2nd toe with superficial scratch and dried blood.  No erythema Neurologic:  alert & oriented X3 and cranial nerves II-XII intact--other than pupil exam.  alert & oriented X3, cranial nerves II-XII intact, strength normal in all extremities, unable to elicit DTRs--difficulty with size and pt. relaxation Skin:  Intact without suspicious lesions or rashes Cervical Nodes:  No lymphadenopathy noted Axillary Nodes:  No palpable lymphadenopathy Inguinal Nodes:  No significant adenopathy Psych:  Cognition and judgment appear intact. Alert and cooperative with normal attention span and concentration. No apparent delusions, illusions, hallucinations   Impression & Recommendations:  Problem # 1:  ROUTINE GYNECOLOGICAL EXAMINATION (ICD-V72.31) Orders: [facility] [phone]) Pap Smear, Thin Prep ( Collection of) [phone]) T- GC Chlamydia [id number]) T-HIV Antibody  (Reflex) [id number]) T-Syphilis Test (RPR) [id number]) Mammogram (Screening) (Mammo)  Problem # 2:  HEALTH MAINTENANCE EXAM (ICD-V70.0) Guaiac Cards x3 to return in 2 weeks. Check with Cardiology as to whether can undergo a screening colonoscopy  Problem # 3:  MENORRHAGIA, PERIMENOPAUSAL (ICD-626.8) To increase to 10 mg of medroxyprogesterone daily--[facility]--not a surgical candidate, but needs something done for bleeding and to follow ovarian cysts.  Problem # 4:  PERIPHERAL EDEMA (ICD-782.3) Knee high compression stockings. Her updated medication list  for this problem includes:    Hyzaar 100-25 Mg Tabs (Losartan potassium-hctz) .Marland Kitchen... 1 tab by mouth daily (pa authorized x 1 year from 1.5.2011)    Furosemide 40 Mg Tabs (Furosemide) .Marland Kitchen... Take 1 qam and 1 by 2pm until swelling goes done then go back to  once daily  Complete Medication List: 1)  Metoprolol Succinate 50 Mg Xr24h-tab (Metoprolol succinate) .... 3 tabs by mouth daily 2)  Novolog Mix 70/30 70-30 % Susp (Insulin aspart prot &  aspart) .... 60 units subcutaneously two times a day before meals. 3)  K-tabs 10 Meq Tbcr (Potassium chloride) .Marland Kitchen.. 1 by mouth once daily 4)  Ferrous Sulfate 325 (65 Fe) Mg Tbec (Ferrous sulfate) .Marland Kitchen.. 1 tab by mouth two times a day 5)  Hyzaar 100-25 Mg Tabs (Losartan potassium-hctz) .Marland Kitchen.. 1 tab by mouth daily (pa authorized x 1 year from 1.5.2011) 6)  Vytorin 10-40 Mg Tabs (Ezetimibe-simvastatin) .Marland Kitchen.. 1 tab by mouth daily.  preauth good through 08/2010 7)  Hydralazine Hcl 50 Mg Tabs (Hydralazine hcl) .... Two times a day 8)  Diltiazem Hcl Er Beads 120 Mg Xr24h-cap (Diltiazem hcl er beads) .... Take 1 tablet by mouth once a day 9)  Aspirin Low Strength 81 Mg Chew (Aspirin) .Marland Kitchen.. 1 tab  by mouth daily 10)  Imdur 60 Mg Xr24h-tab (Isosorbide mononitrate) .Marland Kitchen.. 1 tab by mouth daily 11)  Nitroglycerin 0.4 Mg Subl (Nitroglycerin) .Marland Kitchen.. 1 sublingual as needed chest pain.  may repeat q 5 minutes x 2 if not relieved. 12)  Furosemide 40 Mg Tabs (Furosemide) .... Take 1 qam and 1 by 2pm until swelling goes done then go back to  once daily 13)  Ranexa 500 Mg Xr12h-tab (Ranolazine) .... Two times a day 14)  Prodigy Blood Glucose Monitor Devi (Blood glucose monitoring suppl) .... Two times a day sugar checks  250.02 15)  Prodigy Blood Glucose Test Strp (Glucose blood) .... Two times a day blood sugar checks  250.02 16)  Prodigy Lancing Device Misc (Lancet devices) .... Two times a day blood sugar checks 250.02 17)  Monoject Insulin Syringe 30g X 5/16" 1 Ml Misc (Insulin syringe-needle u-100) .... Two times a day insulin injections 18)  Polyethylene Glycol 3350 Powd (Polyethylene glycol 3350) .... Dissolve 17 grams daily as needed constipation 19)  Oxycodone Hcl 5 Mg Tabs (Oxycodone hcl) .Marland Kitchen.. 1 by mouth q 6hours as needed 20)  Medroxyprogesterone Acetate 10 Mg Tabs (Medroxyprogesterone acetate) .Marland Kitchen.. 1 tab by mouth daily 21)  Isopto Hyoscine 0.25 % Soln (Scopolamine hbr) .Marland Kitchen.. 1 drop in right eye two times a day --dr.  Dominica Severin rankin 22)  Prednisolone Acetate 1 % Susp (Prednisolone acetate) .Marland Kitchen.. 1 drop in left eye three times a day 23)  Artificial Tears Soln (Artificial tear solution) .Marland Kitchen.. 1 drop in both eyes two times a day 24)  Protonix 40 Mg Tbec (Pantoprazole sodium) .Marland Kitchen.. 1 cap by mouth daily on empty stomach before breakfast. 25)  Knee High Graduated Compression Stockings--20 Mm  .... Put on first thing in morning and remove at bedtime  Other Orders: Capillary Blood Glucose/CBG [id number]) UA Dipstick w/o Micro (automated)  (81003) T-Comprehensive Metabolic Panel ([id number]) T-CBC w/Diff [id number]) T- Hemoglobin A1C [id number])  Patient Instructions: 1)  Follow up with Dr. Amil Amen in 4 months --DM, CAD, peripheral edema Prescriptions: KNEE HIGH GRADUATED COMPRESSION STOCKINGS--20 MM put on first thing in morning and remove at bedtime  #2 pair x 0   Entered and Authorized by:   Mack Hook MD  Signed by:   Mack Hook MD on 04/03/2010   Method used:   Print then Give to Patient   RxID:   [id number]    Preventive Care Screening  Prior Values:    Pap Smear:  normal--Dr. [facility] (03/11/2009)    Last Tetanus Booster:  Td (08/02/2002)    Last Flu Shot:  Fluvax 3+ (05/22/2009)    Last Pneumovax:  Pneumovax (05/22/2009)     Mammogram:  has never had a mammogram--has never gone as did not have the money SBE:  occasional--no changes Guaiac Cards:  never Colonoscopy:  never. Osteoprevention:  1 serving of dairy daily.  Little to no exercise.  Last LDL:                                                 38 (10/02/2009 10:18:00 PM)      Diabetic Foot Exam Comments: pt does have cut on R second toe. Pt unsure of how it hapened husband noticed this am   10-g (5.07) Semmes-Weinstein Monofilament Test           Right Foot          Left Foot Test Control                  Site 1         normal         normal Site 2         normal         normal Site 3          normal         normal Site 4         normal         normal Site 5         normal         normal Site 6         normal         normal Site 7         normal         normal Site 8         normal         abnormal Site 9         abnormal         abnormal Site 10         normal         normal   Laboratory Results   Urine Tests  Date/Time Received: April 03, 2010 11:15 AM   Routine Urinalysis   Color: lt. yellow Appearance: Hazy Glucose: 250   (Normal Range: Negative) Bilirubin: negative   (Normal Range: Negative) Ketone: negative   (Normal Range: Negative) Spec. Gravity: 1.010   (Normal Range: 1.003-1.035) Blood: large   (Normal Range: Negative) pH: 6.5   (Normal Range: 5.0-8.0) Protein: negative   (Normal Range: Negative) Urobilinogen: 0.2   (Normal Range: 0-1) Nitrite: negative   (Normal Range: Negative) Leukocyte Esterace: negative   (Normal Range: Negative)     Blood Tests     CBG Random:: 276    Wet [place]/KOH Source: vaginal WBC/hpf: 1-5 Bacteria/hpf: 1+ Clue cells/hpf: few  Negative whiff Yeast/hpf: none Trichomonas/hpf: none

## 2010-09-01 NOTE — Letter (Signed)
Summary: REFERRAL/GYNECOLOGIC  REFERRAL/GYNECOLOGIC   Imported By: Roland Earl 05/07/2010 14:16:33  _____________________________________________________________________  External Attachment:    Type:   Image     Comment:   External Document

## 2010-09-01 NOTE — Letter (Signed)
Summary: [facility]   Imported By: Roland Earl 09/26/2009 12:43:14  _____________________________________________________________________  External Attachment:    Type:   Image     Comment:   External Document

## 2010-09-01 NOTE — Medication Information (Signed)
Summary: RX Folder//RANEXA  RX Folder//RANEXA   Imported By: Roland Earl 11/06/2009 10:59:48  _____________________________________________________________________  External Attachment:    Type:   Image     Comment:   External Document

## 2010-09-01 NOTE — Progress Notes (Signed)
Summary: Office Visit//DEPRESSION SCREENING  Office Visit//DEPRESSION SCREENING   Imported By: Roland Earl 04/09/2010 16:00:23  _____________________________________________________________________  External Attachment:    Type:   Image     Comment:   External Document

## 2010-09-01 NOTE — Progress Notes (Signed)
   Phone Note Outgoing Call   Summary of Call: Called and spoke with Ms. Calisto regarding Medroxyprogesterone and should be able to pick up at [facility] pharmacy tomorrow--if a problem arises, please call in a 10 day Rx to [facility] on [address] ([place]) of 10 mg tabs.  This will replace her norethindrone.   Pt. also to come in and sign a release for records from Dr. Merceda Elks and Dr. Erline Hau latter is gyn/onc at [facility].  I would like a copy of those records as well. Initial call taken by: Mack Hook MD,  February 24, 2010 5:39 PM  Follow-up for Phone Call        OK Follow-up by: Shellia Carwin CMA,  February 25, 2010 11:02 AM

## 2010-09-01 NOTE — Letter (Signed)
Summary: DIABETES SUPPLIES//FAXED  DIABETES SUPPLIES//FAXED   Imported By: Roland Earl 07/06/2010 12:22:49  _____________________________________________________________________  External Attachment:    Type:   Image     Comment:   External Document

## 2010-09-01 NOTE — Progress Notes (Signed)
Summary: DID SHE LEAVE HER RX HERE  Medications Added IMDUR 60 MG XR24H-TAB (ISOSORBIDE MONONITRATE) 1 tab by mouth daily       Phone Note Call from Patient Call back at [facility] Phone [phone]   Summary of Call: [name]. MS Siciliano SAYS [place]. IF IT IS HERE, SHE WOULD LIKE FOR IT TO BE SENT TO [facility]. AND PLEASE LET HER KNOW. Initial call taken by: Roberto Scales,  October 31, 2009 9:23 AM  Follow-up for Phone Call        Burns did not leave Rx here.  She had it in her hand when she left. I don't even know what it was for so can't call in. Follow-up by: Shellia Carwin CMA,  October 31, 2009 11:18 AM  Additional Follow-up for Phone Call Additional follow up Details #1::        SPOKE WITH MS [name] SAYS THAT SHE DIDN'T FIND THE RX, BUT IF [place], WHEN SHE WENT TO THE [place] SEE IT. SHE SAYS THAT SHE WAS TOLD TO STOP TAKING THE ISOSORIBIDE AND START TAKING THE NEW RX THE WAS GIVEN TO HER. Additional Follow-up by: Roberto Scales,  November 03, 2009 10:18 AM    Additional Follow-up for Phone Call Additional follow up Details #2::    Is she talking about the Ranexa?  Burns. also brought in paperwork to get ICP of Ranexa--need her to come in for  and EKG--V58.69--as she is not following up with Dr. Rex Kras.  Need to check QT interval.  Mack Hook MD  November 04, 2009 1:48 PM   Additional Follow-up for Phone Call Additional follow up Details #3:: Details for Additional Follow-up Action Taken: Not sure what med, they are not listed in her d/c summary.  She said it was not the ranexa.  She is actually going to see Dr. Rex Kras today.   Upon further investigation in Burns's Echart I found where she was prescribed generic Imdur 60mg  1 by mouth once daily  this appears to be the only prescription that was actually printed and given to Burns............ [name] CMA   November 19, 2009 9:47 AM   Actually the only Rx generated that I see is Diltiazem from her last admission to the hospital this month. It is for Diltiazem 120 mg once daily.  Does she have this medicine?  If so, she does not need a prescription. Elizabeth Dopp PA-C  November 19, 2009 1:12 PM  Spoke with Burns again, she said it was the Imdur 60mg  they wanted her to take instead of the two of 30mg  she is now taking.  She does have her diltiazem.  She also had an EKG done today that was good she said........  [name] CMA  November 19, 2009 3:33 PM   New/Updated Medications: IMDUR 60 MG XR24H-TAB (ISOSORBIDE MONONITRATE) 1 tab by mouth daily Prescriptions: IMDUR 60 MG XR24H-TAB (ISOSORBIDE MONONITRATE) 1 tab by mouth daily  #30 x 11   Entered and Authorized by:   Mack Hook MD   Signed by:   Mack Hook MD on 11/26/2009   Method used:   Faxed to ...       [facility] (retail)       [address] [place], [place]  [address]  Ph: [phone]       Fax: [phone]   RxIDCK:[id number] IMDUR 60 MG XR24H-TAB (ISOSORBIDE MONONITRATE) 1 tab by mouth daily  #30 x 11   Entered and Authorized by:   Mack Hook MD   Signed by:   Mack Hook MD on 11/26/2009   Method used:   Electronically to        [facility] (retail)       [address]       [place], [place]  [address]       Ph: [phone]       Fax: [phone]   RxID:   [id number]

## 2010-09-01 NOTE — Progress Notes (Signed)
Summary: NEED REFILL ON MEDROXYPR  Phone Note Call from Patient Call back at Home Phone [phone]   Caller: PATIENT AND HUSBAND Summary of Call: [name]  ON THE PHONE, TALKING ABOUT THE MEDICATION THAT YOU PRESCRIBED  TO REGULATE HER BLEEDING. HE GAVE HER 3 ON SATURDAY AND 4 ON "SUNDAY. [name] SAYS THAT SHE NOTICED AND HUSBAND NOTICED THAT HER BLEEDING STOPPED AND SHE WASN'T CRAMPING. [name] IS ALMOST OUT OF THE MEDS AND NEEDS ANOTHER REFILL, BUT SHE WANTS TO KNOW IF SHE CAN GET MORE IN QUANITY, BECAUSE TAKING MORE SHE DID NOT BLEED OR CRAMP AND SHE FELT BETTER. SHE USES [facility]. Initial call taken by: Kimberly Tinnin,  April 20, 2010 9:34 AM  Follow-up for Phone Call        Sent to E. [facility].  Theresa Laib RN  April 20, 2010 10:36 AM   Additional Follow-up for Phone Call Additional follow up Details #1::        I did send a small refill to [facility] on [address], but she was supposed to see gyne on 8/18 and discuss further treatment--did she go? What did they decide? Additional Follow-up by: Elizabeth Mulberry MD,  April 23, 2010 6:35 PM    Additional Follow-up for Phone Call Additional follow up Details #2::    no answer. unable to leave message Chantel Miller  April 24, 2010 2:17 PM  PT GETS HER rX AT THE [facility] B/C [facility] WILL FILL #60 FOR $4 AND [facility] WANTED $5 FOR #15. PT IS OK WITH MEDS RIGHT NOW.  GYN APPT IS SCHEDULED FOR 04/30/10. THE 8/18 DATE WAS THE DATE THE REFERRAL WAS FAXED.  PT WILL KEEP PROVDER UP TO DATE ON TREATMENT. Follow-up by: Chantel Miller,  April 27, 2010 11:42 AM  Prescriptions: MEDROXYPROGESTERONE ACETATE 10 MG TABS (MEDROXYPROGESTERONE ACETATE) 1 tab by mouth daily  #15 x 0   Entered and Authorized by:   Elizabeth Mulberry MD   Signed by:   Elizabeth Mulberry MD on 04/23/2010   Method used:   Electronically to        [facility]  [address] (retail)       [address]  [place], [place]  [address]       Ph: [phone]       Fax: [phone]   RxID:   [id number]

## 2010-09-01 NOTE — Progress Notes (Signed)
Summary: LAB ORDER CORRECTED  Phone Note Other Incoming   Caller: SPECTRUM Summary of Call: FYI G/C PROBE AND URINE ORDERED. LAB ONLY RECEIVED PROBE...... URINE WILL BE CANCELLED Initial call taken by: Rhunette Croft,  April 07, 2010 8:50 AM

## 2010-09-03 NOTE — Progress Notes (Signed)
Summary: Needs appt.  Phone Note Outgoing Call   Summary of Call: Pt. requesting another refill of medroxyprogesterone from pharmacy. Spoke with Malcolm Metro, [facility], who saw her on 27.2.11 and discussed continuing provera vs a Mirena IUD.  Pt. elected to do the Provera and think about the latter.   Ms. Shores recommended today to just have her start the progesterone up only when bleeding started--now that her bleeding is stopped.  Discussed that she had been taking the medication every day for quite some time.   Pt. not a candidate for endometrial ablation as she would need general anesthesia. She was placed on Megace at last visit and was to taper off--discussed being careful with this as she is a diabetic and concerned it would cause too much appetite stimulation. She does have multiple fibroids and had an benign endometrial polyp on endometrial biopsy  Called Mrs. Favaro--she was having chest pain and dyspnea after eating breakfast--has been having this the past 2-3 weeks, at least twice weekly.  I told her and her husband she should go to the ED. We will table the hormone discussion until she is doing better. Sounds like she has not weaned off the Megace, not clear what she is doing with meds--she will need to come in and bring medications to clarify--please call tomorrow and set that up.  If they are not home or state she is hospitalized, have them call when she gets out. Initial call taken by: Mack Hook MD,  August 19, 2010 7:51 AM  Follow-up for Phone Call        appt is scheduled Follow-up by: [place] Shannon,  August 24, 2010 2:41 PM    Prescriptions: VYTORIN 10-40 MG TABS (EZETIMIBE-SIMVASTATIN) 1 tab by mouth daily.  Preauth good through 08/2010  #30 x 6   Entered and Authorized by:   Aurora Mask FNP   Signed by:   Aurora Mask FNP on 08/24/2010   Method used:   Faxed to ...       [facility] (retail)  [address] [place], [place]  [address]       Ph: [phone]       Fax: [phone]   RxID:   [id number]

## 2010-09-03 NOTE — Letter (Signed)
Summary: [facility]   Imported By: Roland Earl 07/14/2010 11:32:01  _____________________________________________________________________  External Attachment:    Type:   Image     Comment:   External Document

## 2010-09-03 NOTE — Letter (Signed)
Summary: FOLLOW UP FROM COLPOSCOPY  FOLLOW UP FROM [facility]   Imported By: Roland Earl 08/20/2010 14:04:23  _____________________________________________________________________  External Attachment:    Type:   Image     Comment:   External Document

## 2010-10-01 ENCOUNTER — Telehealth (INDEPENDENT_AMBULATORY_CARE_PROVIDER_SITE_OTHER): Payer: Self-pay | Admitting: Internal Medicine

## 2010-10-01 ENCOUNTER — Encounter (INDEPENDENT_AMBULATORY_CARE_PROVIDER_SITE_OTHER): Payer: Self-pay | Admitting: Internal Medicine

## 2010-10-01 ENCOUNTER — Encounter: Payer: Self-pay | Admitting: Internal Medicine

## 2010-10-01 LAB — CONVERTED CEMR LAB
BUN: 16 mg/dL (ref 6–23)
CO2: 22 meq/L (ref 19–32)
Calcium: 9.2 mg/dL (ref 8.4–10.5)
Creatinine, Ser: 1.16 mg/dL (ref 0.40–1.20)
Glucose, Bld: 344 mg/dL — ABNORMAL HIGH (ref 70–99)

## 2010-10-08 NOTE — Assessment & Plan Note (Signed)
Summary: Hosp. f/u    Vital Signs:  Patient profile:   55 year old female Menstrual status:  irregular Weight:      272.56 pounds Temp:     98.5 degrees F oral Pulse rate:   85 / minute Pulse rhythm:   regular Resp:     20 per minute BP sitting:   170 / 76  (left arm)  Vitals Entered By: Aquilla Solian CMA (October 01, 2010 10:45 AM) CC: f/u. Never did go to the ED after speaking w/Dr. Amil Amen on 08/19/10. Needing refills on Megestrol and Nitrostat. Has appt. w/Dr. Rex Kras (cardio) today at 3:30pm.  Is Patient Diabetic? Yes Pain Assessment Patient in pain? no      CBG Result 468 CBG Device ID non fasting  Does patient need assistance? Functional Status Self care Ambulation Normal     Menstrual Status irregular Last PAP Result NEGATIVE FOR INTRAEPITHELIAL LESIONS OR MALIGNANCY.   CC:  f/u. Never did go to the ED after speaking w/Dr. Amil Amen on 08/19/10. Needing refills on Megestrol and Nitrostat. Has appt. w/Dr. Rex Kras (cardio) today at 3:30pm. .  History of Present Illness: 1.  Dysfunctional Uterine bleeding:  Has not tapered off Megace as was supposed to.  Ran out of 40 mg of Megace yesterday.  Goes back to [facility] on the 9th of March.  Discussed previously with [facility] and they recommended just taking progesterone when starts bleeding.  2.  Hypertension:  Sounds like missing meds or taking late at times per husband  3.  DM:  Sugars not controlled --cannot get a good history.  Sounds like eating too much per husband --is on Megace for bleeding.  4.  CAD:  did not go to ED for dyspnea --see last phone note.  Has appt. with Cardiology today.    Current Medications (verified): 1)  Metoprolol Succinate 200 Mg Xr24h-Tab (Metoprolol Succinate) .... Take 1 Tablet By Mouth Once A Day 2)  Novolog Mix 70/30 70-30 %  Susp (Insulin Aspart Prot & Aspart) .... 60 Units Subcutaneously Two Times A Day Before Meals. 3)  K-Tabs 10 Meq  Tbcr (Potassium Chloride) .Marland Kitchen.. 1  By Mouth Once Daily 4)  Ferrous Sulfate 325 (65 Fe) Mg  Tbec (Ferrous Sulfate) .Marland Kitchen.. 1 Tab By Mouth Two Times A Day 5)  Hyzaar 100-25 Mg  Tabs (Losartan Potassium-Hctz) .Marland Kitchen.. 1 Tab By Mouth Daily (Pa Authorized X 1 Year From 1.5.2011) 6)  Vytorin 10-40 Mg Tabs (Ezetimibe-Simvastatin) .Marland Kitchen.. 1 Tab By Mouth Daily.  Preauth Good Through 08/2010 7)  Hydralazine Hcl 50 Mg Tabs (Hydralazine Hcl) .... Two Times A Day 8)  Diltiazem Hcl Er Beads 120 Mg Xr24h-Cap (Diltiazem Hcl Er Beads) .... Take 1 Tablet By Mouth Once A Day 9)  Aspirin Low Strength 81 Mg Chew (Aspirin) .Marland Kitchen.. 1 Tab  By Mouth Daily 10)  Imdur 60 Mg Xr24h-Tab (Isosorbide Mononitrate) .Marland Kitchen.. 1 Tab By Mouth Daily 11)  Nitroglycerin 0.4 Mg Subl (Nitroglycerin) .Marland Kitchen.. 1 Sublingual As Needed Chest Pain.  May Repeat Q 5 Minutes X 2 If Not Relieved. 12)  Furosemide 40 Mg Tabs (Furosemide) .... Take 1 Qam and 1 By 2pm Until Swelling Goes Done Then Go Back To  Once Daily 13)  Ranexa 500 Mg Xr12h-Tab (Ranolazine) .... Two Times A Day 14)  Prodigy Blood Glucose Monitor  Devi (Blood Glucose Monitoring Suppl) .... Two Times A Day Sugar Checks  250.02 15)  Prodigy Blood Glucose Test  Strp (Glucose Blood) .... Two Times  A Day Blood Sugar Checks  250.02 16)  Prodigy Lancing Device  Misc (Lancet Devices) .... Two Times A Day Blood Sugar Checks 250.02 17)  Monoject Insulin Syringe 30g X 5/16" 1 Ml Misc (Insulin Syringe-Needle U-100) .... Two Times A Day Insulin Injections 18)  Polyethylene Glycol 3350  Powd (Polyethylene Glycol 3350) .... Dissolve 17 Grams Daily As Needed Constipation 19)  Oxycodone Hcl 5 Mg Tabs (Oxycodone Hcl) .Marland Kitchen.. 1 By Mouth Q 6hours As Needed 20)  Medroxyprogesterone Acetate 10 Mg Tabs (Medroxyprogesterone Acetate) .Marland Kitchen.. 1 Tab By Mouth Daily 21)  Isopto Hyoscine 0.25 % Soln (Scopolamine Hbr) .Marland Kitchen.. 1 Drop in Right Eye Two Times A Day --Dr. Deloria Lair 22)  Prednisolone Acetate 1 % Susp (Prednisolone Acetate) .Marland Kitchen.. 1 Drop in Left Eye Three Times A  Day 23)  Artificial Tears  Soln (Artificial Tear Solution) .Marland Kitchen.. 1 Drop in Both Eyes Two Times A Day 24)  Protonix 40 Mg Tbec (Pantoprazole Sodium) .Marland Kitchen.. 1 Cap By Mouth Daily On Empty Stomach Before Breakfast. 25)  Knee High Graduated Compression Stockings--20 Mm .... Put On First Thing in Morning and Remove At Bedtime  Allergies (verified): No Known Drug Allergies  Physical Exam  General:  MOrbidly obese Lungs:  Normal respiratory effort, chest expands symmetrically. Lungs are clear to auscultation, no crackles or wheezes. Heart:  Normal rate and regular rhythm. S1 and S2 normal without gallop, murmur, click, rub or other extra sounds.  Radial pulses normal and equal Extremities:  Mild pitting edema at ankles  Diabetes Management Exam:    Foot Exam (with socks and/or shoes not present):       Sensory-Monofilament:          Left foot: normal          Right foot: normal   Impression & Recommendations:  Problem # 1:  MENORRHAGIA, PERIMENOPAUSAL (ICD-626.8) Wean Megace To use Medroxyprogesterone if starts bleeding for 10 day course.  Problem # 2:  DIABETES MELLITUS, TYPE II, ON INSULIN, UNCONTROLLED (ICD-250.72) Referral back to Nutrition Wean off Megesterol increase Novolog to 65 units two times a day  Her updated medication list for this problem includes:    Novolog Mix 70/30 70-30 % Susp (Insulin aspart prot & aspart) .Marland KitchenMarland KitchenMarland KitchenMarland Kitchen 65 units subcutaneously two times a day before meals.    Hyzaar 100-25 Mg Tabs (Losartan potassium-hctz) .Marland Kitchen... 1 tab by mouth daily (pa authorized x 1 year from 1.5.2011)    Aspirin Low Strength 81 Mg Chew (Aspirin) .Marland Kitchen... 1 tab  by mouth daily  Orders: Hgb A1C [id number]) Nutrition Referral (Nutrition)  Problem # 3:  HYPERTENSION, BENIGN ESSENTIAL (ICD-401.1) Discussed when to take meds to avoid side effects--to take at regular times and not miss Her updated medication list for this problem includes:    Metoprolol Succinate 200 Mg Xr24h-tab (Metoprolol  succinate) .Marland Kitchen... Take 1 tablet by mouth once a day    Hyzaar 100-25 Mg Tabs (Losartan potassium-hctz) .Marland Kitchen... 1 tab by mouth daily (pa authorized x 1 year from 1.5.2011)    Hydralazine Hcl 50 Mg Tabs (Hydralazine hcl) .Marland Kitchen..Marland Kitchen Two times a day    Diltiazem Hcl Er Beads 120 Mg Xr24h-cap (Diltiazem hcl er beads) .Marland Kitchen... Take 1 tablet by mouth once a day    Furosemide 40 Mg Tabs (Furosemide) .Marland Kitchen... Take 1 qam and 1 by 2pm until swelling goes done then go back to  once daily  Orders: Nutrition Referral (Nutrition)  Complete Medication List: 1)  Metoprolol Succinate 200 Mg Xr24h-tab (Metoprolol succinate) .... Take  1 tablet by mouth once a day 2)  Novolog Mix 70/30 70-30 % Susp (Insulin aspart prot & aspart) .... 65 units subcutaneously two times a day before meals. 3)  K-tabs 10 Meq Tbcr (Potassium chloride) .Marland Kitchen.. 1 by mouth once daily 4)  Ferrous Sulfate 325 (65 Fe) Mg Tbec (Ferrous sulfate) .Marland Kitchen.. 1 tab by mouth two times a day 5)  Hyzaar 100-25 Mg Tabs (Losartan potassium-hctz) .Marland Kitchen.. 1 tab by mouth daily (pa authorized x 1 year from 1.5.2011) 6)  Vytorin 10-40 Mg Tabs (Ezetimibe-simvastatin) .Marland Kitchen.. 1 tab by mouth daily.  preauth good through 08/2010 7)  Hydralazine Hcl 50 Mg Tabs (Hydralazine hcl) .... Two times a day 8)  Diltiazem Hcl Er Beads 120 Mg Xr24h-cap (Diltiazem hcl er beads) .... Take 1 tablet by mouth once a day 9)  Aspirin Low Strength 81 Mg Chew (Aspirin) .Marland Kitchen.. 1 tab  by mouth daily 10)  Imdur 60 Mg Xr24h-tab (Isosorbide mononitrate) .Marland Kitchen.. 1 tab by mouth daily 11)  Nitroglycerin 0.4 Mg Subl (Nitroglycerin) .Marland Kitchen.. 1 sublingual as needed chest pain.  may repeat q 5 minutes x 2 if not relieved. 12)  Furosemide 40 Mg Tabs (Furosemide) .... Take 1 qam and 1 by 2pm until swelling goes done then go back to  once daily 13)  Ranexa 500 Mg Xr12h-tab (Ranolazine) .... Two times a day 14)  Prodigy Blood Glucose Monitor Devi (Blood glucose monitoring suppl) .... Two times a day sugar checks  250.02 15)  Prodigy  Blood Glucose Test Strp (Glucose blood) .... Two times a day blood sugar checks  250.02 16)  Prodigy Lancing Device Misc (Lancet devices) .... Two times a day blood sugar checks 250.02 17)  Monoject Insulin Syringe 30g X 5/16" 1 Ml Misc (Insulin syringe-needle u-100) .... Two times a day insulin injections 18)  Polyethylene Glycol 3350 Powd (Polyethylene glycol 3350) .... Dissolve 17 grams daily as needed constipation 19)  Oxycodone Hcl 5 Mg Tabs (Oxycodone hcl) .Marland Kitchen.. 1 by mouth q 6hours as needed 20)  Isopto Hyoscine 0.25 % Soln (Scopolamine hbr) .Marland Kitchen.. 1 drop in right eye two times a day --dr. Dominica Severin rankin 21)  Prednisolone Acetate 1 % Susp (Prednisolone acetate) .Marland Kitchen.. 1 drop in left eye three times a day 22)  Artificial Tears Soln (Artificial tear solution) .Marland Kitchen.. 1 drop in both eyes two times a day 23)  Protonix 40 Mg Tbec (Pantoprazole sodium) .Marland Kitchen.. 1 cap by mouth daily on empty stomach before breakfast. 24)  Knee High Graduated Compression Stockings--20 Mm  .... Put on first thing in morning and remove at bedtime 25)  Megestrol Acetate 20 Mg Tabs (Megestrol acetate) .Marland Kitchen.. 1 tab by mouth daily for 3 days, then decrease to 1/2 tab daily for 3 days, then stop 26)  Medroxyprogesterone Acetate 10 Mg Tabs (Medroxyprogesterone acetate) .... 2 tabs by mouth daily for 10 days if menstrual flow starts  Other Orders: Capillary Blood Glucose/CBG (Q000111Q) T-Basic Metabolic Panel (99991111)  Patient Instructions: 1)  Follow up with Dr. Amil Amen in 3 months --DM, Nutrition, bleeding., htn Prescriptions: NOVOLOG MIX 70/30 70-30 %  SUSP (INSULIN ASPART PROT & ASPART) 65 units subcutaneously two times a day before meals.  #1 month x 11   Entered and Authorized by:   Mack Hook MD   Signed by:   Mack Hook MD on 10/01/2010   Method used:   Electronically to        [facility] (retail)       [place]  [place], [place]  [address]       Ph: [phone]        Fax: [phone]   RxID:   [id number] MEDROXYPROGESTERONE ACETATE 10 MG TABS (MEDROXYPROGESTERONE ACETATE) 2 tabs by mouth daily for 10 days if menstrual flow starts  #20 x 1   Entered and Authorized by:   Mack Hook MD   Signed by:   Mack Hook MD on 10/01/2010   Method used:   Electronically to        [facility] (retail)       [address]       [place], [place]  [address]       Ph: [phone]       Fax: [phone]   RxID:   [id number] MEGESTROL ACETATE 20 MG TABS (MEGESTROL ACETATE) 1 tab by mouth daily for 3 days, then decrease to 1/2 tab daily for 3 days, then stop  #6 x 0   Entered and Authorized by:   Mack Hook MD   Signed by:   Mack Hook MD on 10/01/2010   Method used:   Electronically to        [facility] (retail)       [address]       [place], [place]  [address]       Ph: [phone]       Fax: [phone]   RxID:   [id number]    Orders Added: 1)  Capillary Blood Glucose/CBG [82948] 2)  Hgb A1C [83036QW] 3)  T-Basic Metabolic Panel 0000000 4)  Est. Patient Level IV [id number] 5)  Nutrition Referral [Nutrition]     Diabetic Foot Exam    10-g (5.07) Semmes-Weinstein Monofilament Test Performed by: Aquilla Solian CMA          Right Foot          Left Foot Visual Inspection     normal         normal Test Control      normal         normal Site 1         normal         normal Site 2         normal         normal Site 3         normal         normal Site 4         normal         normal Site 5         normal         normal Site 6         normal         normal Site 7         normal         normal Site 8         normal         normal Site 9         normal         normal Site 10         normal         normal  Impression      normal         normal  Laboratory Results   Blood Tests   Date/Time Received: October 01, 2010 10:52 AM    HGBA1C: 11.6%   (Normal Range: Non-Diabetic - 3-6%   Control Diabetic - 6-8%) CBG Random:: 468mg /dL

## 2010-10-09 ENCOUNTER — Encounter: Payer: Self-pay | Admitting: Obstetrics and Gynecology

## 2010-10-09 ENCOUNTER — Ambulatory Visit: Payer: Self-pay | Admitting: Obstetrics and Gynecology

## 2010-10-09 DIAGNOSIS — N949 Unspecified condition associated with female genital organs and menstrual cycle: Secondary | ICD-10-CM

## 2010-10-09 LAB — CONVERTED CEMR LAB
Estradiol: 21.3 pg/mL
MCHC: 34.4 g/dL (ref 30.0–36.0)
MCV: 93.4 fL (ref 78.0–100.0)
Platelets: 286 10*3/uL (ref 150–400)

## 2010-10-13 NOTE — Letter (Signed)
Summary: [facility]  [facility]   Imported By: Roland Earl 10/06/2010 10:31:12  _____________________________________________________________________  External Attachment:    Type:   Image     Comment:   External Document

## 2010-10-13 NOTE — Progress Notes (Signed)
Summary: NUtrition referral  Phone Note Outgoing Call   Summary of Call: NOra--nutrition referral--want husband to go as well--he's the cook Initial call taken by: Mack Hook MD,  October 01, 2010 11:48 AM  Follow-up for Phone Call        [place] # [phone] [place] [facility] TO SCHEDULE AN APPT . WAITING FOR AN APPT.Marland KitchenMaren Reamer  October 07, 2010 10:11 AM

## 2010-10-15 LAB — DIFFERENTIAL
Basophils Absolute: 0 10*3/uL (ref 0.0–0.1)
Basophils Relative: 0 % (ref 0–1)
Eosinophils Absolute: 0 10*3/uL (ref 0.0–0.7)
Neutrophils Relative %: 86 % — ABNORMAL HIGH (ref 43–77)

## 2010-10-15 LAB — COMPREHENSIVE METABOLIC PANEL
ALT: 11 U/L (ref 0–35)
Alkaline Phosphatase: 37 U/L — ABNORMAL LOW (ref 39–117)
CO2: 25 mEq/L (ref 19–32)
Calcium: 9 mg/dL (ref 8.4–10.5)
Chloride: 104 mEq/L (ref 96–112)
GFR calc non Af Amer: 43 mL/min — ABNORMAL LOW (ref >60)
Glucose, Bld: 151 mg/dL — ABNORMAL HIGH (ref 70–99)
Sodium: 138 mEq/L (ref 135–145)
Total Bilirubin: 0.1 mg/dL — ABNORMAL LOW (ref 0.3–1.2)

## 2010-10-15 LAB — URINALYSIS, ROUTINE W REFLEX MICROSCOPIC
Bilirubin Urine: NEGATIVE
Nitrite: NEGATIVE
Specific Gravity, Urine: 1.019 (ref 1.005–1.030)
Urobilinogen, UA: 0.2 mg/dL (ref 0.0–1.0)

## 2010-10-15 LAB — CBC
Hemoglobin: 11.1 g/dL — ABNORMAL LOW (ref 12.0–15.0)
MCH: 31.8 pg (ref 26.0–34.0)
MCHC: 34 g/dL (ref 30.0–36.0)

## 2010-10-15 LAB — URINE MICROSCOPIC-ADD ON

## 2010-10-15 LAB — CK TOTAL AND CKMB (NOT AT ARMC): Relative Index: INVALID (ref 0.0–2.5)

## 2010-10-15 LAB — TROPONIN I: Troponin I: 0.03 ng/mL (ref 0.00–0.06)

## 2010-10-17 LAB — CARDIAC PANEL(CRET KIN+CKTOT+MB+TROPI)
CK, MB: 1.3 ng/mL (ref 0.3–4.0)
Relative Index: INVALID (ref 0.0–2.5)
Troponin I: 0.02 ng/mL (ref 0.00–0.06)

## 2010-10-17 LAB — COMPREHENSIVE METABOLIC PANEL
ALT: 19 U/L (ref 0–35)
AST: 17 U/L (ref 0–37)
Albumin: 2.9 g/dL — ABNORMAL LOW (ref 3.5–5.2)
CO2: 29 mEq/L (ref 19–32)
Calcium: 8.2 mg/dL — ABNORMAL LOW (ref 8.4–10.5)
Creatinine, Ser: 0.88 mg/dL (ref 0.4–1.2)
GFR calc Af Amer: >60 mL/min (ref >60)
GFR calc non Af Amer: >60 mL/min (ref >60)
Sodium: 136 mEq/L (ref 135–145)

## 2010-10-17 LAB — DIFFERENTIAL
Basophils Absolute: 0 10*3/uL (ref 0.0–0.1)
Basophils Relative: 0 % (ref 0–1)
Lymphocytes Relative: 11 % — ABNORMAL LOW (ref 12–46)
Neutro Abs: 10.8 10*3/uL — ABNORMAL HIGH (ref 1.7–7.7)

## 2010-10-17 LAB — CBC
HCT: 25.4 % — ABNORMAL LOW (ref 36.0–46.0)
HCT: 32.9 % — ABNORMAL LOW (ref 36.0–46.0)
Hemoglobin: 8.6 g/dL — ABNORMAL LOW (ref 12.0–15.0)
Hemoglobin: 9.8 g/dL — ABNORMAL LOW (ref 12.0–15.0)
MCHC: 33.6 g/dL (ref 30.0–36.0)
MCHC: 34.1 g/dL (ref 30.0–36.0)
MCV: 90.3 fL (ref 78.0–100.0)
MCV: 91.8 fL (ref 78.0–100.0)
Platelets: 319 10*3/uL (ref 150–400)
RBC: 3.15 MIL/uL — ABNORMAL LOW (ref 3.87–5.11)
RDW: 17 % — ABNORMAL HIGH (ref 11.5–15.5)
RDW: 17.2 % — ABNORMAL HIGH (ref 11.5–15.5)
RDW: 17.6 % — ABNORMAL HIGH (ref 11.5–15.5)
WBC: 12 10*3/uL — ABNORMAL HIGH (ref 4.0–10.5)
WBC: 13 10*3/uL — ABNORMAL HIGH (ref 4.0–10.5)

## 2010-10-17 LAB — GLUCOSE, CAPILLARY
Glucose-Capillary: 137 mg/dL — ABNORMAL HIGH (ref 70–99)
Glucose-Capillary: 157 mg/dL — ABNORMAL HIGH (ref 70–99)
Glucose-Capillary: 182 mg/dL — ABNORMAL HIGH (ref 70–99)
Glucose-Capillary: 196 mg/dL — ABNORMAL HIGH (ref 70–99)
Glucose-Capillary: 202 mg/dL — ABNORMAL HIGH (ref 70–99)
Glucose-Capillary: 203 mg/dL — ABNORMAL HIGH (ref 70–99)
Glucose-Capillary: 218 mg/dL — ABNORMAL HIGH (ref 70–99)
Glucose-Capillary: 78 mg/dL (ref 70–99)
Glucose-Capillary: 91 mg/dL (ref 70–99)

## 2010-10-17 LAB — POCT I-STAT, CHEM 8
BUN: 8 mg/dL (ref 6–23)
Chloride: 103 mEq/L (ref 96–112)
HCT: 27 % — ABNORMAL LOW (ref 36.0–46.0)
Sodium: 136 mEq/L (ref 135–145)
TCO2: 24 mmol/L (ref 0–100)

## 2010-10-17 LAB — URINALYSIS, ROUTINE W REFLEX MICROSCOPIC
Leukocytes, UA: NEGATIVE
Protein, ur: NEGATIVE mg/dL
Urobilinogen, UA: 0.2 mg/dL (ref 0.0–1.0)

## 2010-10-17 LAB — BASIC METABOLIC PANEL
BUN: 13 mg/dL (ref 6–23)
BUN: 8 mg/dL (ref 6–23)
CO2: 30 mEq/L (ref 19–32)
Calcium: 8.5 mg/dL (ref 8.4–10.5)
Calcium: 9.1 mg/dL (ref 8.4–10.5)
Chloride: 103 mEq/L (ref 96–112)
Creatinine, Ser: 0.98 mg/dL (ref 0.4–1.2)
GFR calc Af Amer: >60 mL/min (ref >60)
GFR calc non Af Amer: 52 mL/min — ABNORMAL LOW (ref >60)
GFR calc non Af Amer: 59 mL/min — ABNORMAL LOW (ref >60)
Glucose, Bld: 199 mg/dL — ABNORMAL HIGH (ref 70–99)
Glucose, Bld: 76 mg/dL (ref 70–99)
Potassium: 3.9 mEq/L (ref 3.5–5.1)
Sodium: 142 mEq/L (ref 135–145)

## 2010-10-17 LAB — LIPID PANEL
LDL Cholesterol: 25 mg/dL (ref 0–99)
Total CHOL/HDL Ratio: 2.5 RATIO
VLDL: 23 mg/dL (ref 0–40)

## 2010-10-17 LAB — POCT CARDIAC MARKERS
Myoglobin, poc: 41.9 ng/mL (ref 12–200)
Troponin i, poc: <0.05 ng/mL (ref 0.00–0.09)
Troponin i, poc: <0.05 ng/mL (ref 0.00–0.09)

## 2010-10-17 LAB — PREPARE RBC (CROSSMATCH)

## 2010-10-17 LAB — PROTIME-INR: INR: 1.29 (ref 0.00–1.49)

## 2010-10-17 LAB — CROSSMATCH
ABO/RH(D): B POS
Antibody Screen: NEGATIVE

## 2010-10-17 LAB — TSH: TSH: 1.319 u[IU]/mL (ref 0.350–4.500)

## 2010-10-17 LAB — CK TOTAL AND CKMB (NOT AT ARMC)
CK, MB: 1.1 ng/mL (ref 0.3–4.0)
Relative Index: INVALID (ref 0.0–2.5)

## 2010-10-17 LAB — APTT: aPTT: 31 seconds (ref 24–37)

## 2010-10-17 LAB — MRSA PCR SCREENING: MRSA by PCR: NEGATIVE

## 2010-10-17 LAB — URINE MICROSCOPIC-ADD ON

## 2010-10-17 LAB — TROPONIN I: Troponin I: 0.04 ng/mL (ref 0.00–0.06)

## 2010-10-18 LAB — GLUCOSE, CAPILLARY
Glucose-Capillary: 101 mg/dL — ABNORMAL HIGH (ref 70–99)
Glucose-Capillary: 133 mg/dL — ABNORMAL HIGH (ref 70–99)
Glucose-Capillary: 138 mg/dL — ABNORMAL HIGH (ref 70–99)
Glucose-Capillary: 158 mg/dL — ABNORMAL HIGH (ref 70–99)
Glucose-Capillary: 97 mg/dL (ref 70–99)
Glucose-Capillary: 102 mg/dL — ABNORMAL HIGH (ref 70–99)

## 2010-10-18 LAB — CBC
HCT: 20.4 % — ABNORMAL LOW (ref 36.0–46.0)
HCT: 21.9 % — ABNORMAL LOW (ref 36.0–46.0)
HCT: 25.4 % — ABNORMAL LOW (ref 36.0–46.0)
HCT: 32.1 % — ABNORMAL LOW (ref 36.0–46.0)
Hemoglobin: 10.9 g/dL — ABNORMAL LOW (ref 12.0–15.0)
Hemoglobin: 6.8 g/dL — CL (ref 12.0–15.0)
Hemoglobin: 7.4 g/dL — ABNORMAL LOW (ref 12.0–15.0)
Hemoglobin: 8.6 g/dL — ABNORMAL LOW (ref 12.0–15.0)
MCH: 30.5 pg (ref 26.0–34.0)
MCH: 30.5 pg (ref 26.0–34.0)
MCH: 30.5 pg (ref 26.0–34.0)
MCH: 30.6 pg (ref 26.0–34.0)
MCHC: 33.5 g/dL (ref 30.0–36.0)
MCHC: 33.7 g/dL (ref 30.0–36.0)
MCV: 88.8 fL (ref 78.0–100.0)
MCV: 90.9 fL (ref 78.0–100.0)
MCV: 90.9 fL (ref 78.0–100.0)
Platelets: 238 10*3/uL (ref 150–400)
Platelets: 250 10*3/uL (ref 150–400)
Platelets: 296 10*3/uL (ref 150–400)
RBC: 2.41 MIL/uL — ABNORMAL LOW (ref 3.87–5.11)
RBC: 3.43 MIL/uL — ABNORMAL LOW (ref 3.87–5.11)
RBC: 3.62 MIL/uL — ABNORMAL LOW (ref 3.87–5.11)
RDW: 15.3 % (ref 11.5–15.5)
RDW: 15.7 % — ABNORMAL HIGH (ref 11.5–15.5)
RDW: 16.1 % — ABNORMAL HIGH (ref 11.5–15.5)
WBC: 11 10*3/uL — ABNORMAL HIGH (ref 4.0–10.5)
WBC: 12.4 10*3/uL — ABNORMAL HIGH (ref 4.0–10.5)
WBC: 12.7 10*3/uL — ABNORMAL HIGH (ref 4.0–10.5)
WBC: 16.1 10*3/uL — ABNORMAL HIGH (ref 4.0–10.5)
WBC: 16.5 10*3/uL — ABNORMAL HIGH (ref 4.0–10.5)

## 2010-10-18 LAB — IRON AND TIBC: TIBC: 276 ug/dL (ref 250–470)

## 2010-10-18 LAB — COMPREHENSIVE METABOLIC PANEL
ALT: 11 U/L (ref 0–35)
AST: 16 U/L (ref 0–37)
Albumin: 2.6 g/dL — ABNORMAL LOW (ref 3.5–5.2)
Alkaline Phosphatase: 40 U/L (ref 39–117)
CO2: 27 mEq/L (ref 19–32)
Chloride: 103 mEq/L (ref 96–112)
GFR calc Af Amer: >60 mL/min (ref >60)
GFR calc non Af Amer: >60 mL/min (ref >60)
Potassium: 3.2 mEq/L — ABNORMAL LOW (ref 3.5–5.1)
Sodium: 136 mEq/L (ref 135–145)
Total Bilirubin: 0.3 mg/dL (ref 0.3–1.2)

## 2010-10-18 LAB — POCT I-STAT, CHEM 8
Calcium, Ion: 1.04 mmol/L — ABNORMAL LOW (ref 1.12–1.32)
Chloride: 101 mEq/L (ref 96–112)
HCT: 24 % — ABNORMAL LOW (ref 36.0–46.0)
Hemoglobin: 8.2 g/dL — ABNORMAL LOW (ref 12.0–15.0)
Potassium: 3.3 mEq/L — ABNORMAL LOW (ref 3.5–5.1)

## 2010-10-18 LAB — FOLATE: Folate: 15.5 ng/mL

## 2010-10-18 LAB — BASIC METABOLIC PANEL
BUN: 9 mg/dL (ref 6–23)
CO2: 25 mEq/L (ref 19–32)
CO2: 25 mEq/L (ref 19–32)
CO2: 27 mEq/L (ref 19–32)
Calcium: 8.8 mg/dL (ref 8.4–10.5)
Chloride: 102 mEq/L (ref 96–112)
Chloride: 103 mEq/L (ref 96–112)
Creatinine, Ser: 0.96 mg/dL (ref 0.4–1.2)
Creatinine, Ser: 1.1 mg/dL (ref 0.4–1.2)
GFR calc Af Amer: >60 mL/min (ref >60)
GFR calc Af Amer: >60 mL/min (ref >60)
GFR calc non Af Amer: >60 mL/min (ref >60)
Glucose, Bld: 119 mg/dL — ABNORMAL HIGH (ref 70–99)
Glucose, Bld: 167 mg/dL — ABNORMAL HIGH (ref 70–99)
Sodium: 134 mEq/L — ABNORMAL LOW (ref 135–145)
Sodium: 142 mEq/L (ref 135–145)

## 2010-10-18 LAB — LIPID PANEL
LDL Cholesterol: 22 mg/dL (ref 0–99)
Total CHOL/HDL Ratio: 2.4 RATIO
VLDL: 15 mg/dL (ref 0–40)

## 2010-10-18 LAB — TSH: TSH: 0.47 u[IU]/mL (ref 0.350–4.500)

## 2010-10-18 LAB — CK TOTAL AND CKMB (NOT AT ARMC)
Relative Index: 1.9 (ref 0.0–2.5)
Relative Index: INVALID (ref 0.0–2.5)
Total CK: 61 U/L (ref 7–177)

## 2010-10-18 LAB — HEMOGLOBIN A1C: Hgb A1c MFr Bld: 7.1 % — ABNORMAL HIGH (ref <5.7)

## 2010-10-18 LAB — TYPE AND SCREEN: ABO/RH(D): B POS

## 2010-10-18 LAB — DIFFERENTIAL
Basophils Absolute: 0 10*3/uL (ref 0.0–0.1)
Basophils Absolute: 0 10*3/uL (ref 0.0–0.1)
Eosinophils Absolute: 0 10*3/uL (ref 0.0–0.7)
Eosinophils Absolute: 0 10*3/uL (ref 0.0–0.7)
Eosinophils Relative: 0 % (ref 0–5)
Eosinophils Relative: 0 % (ref 0–5)
Lymphocytes Relative: 13 % (ref 12–46)
Monocytes Absolute: 0.4 10*3/uL (ref 0.1–1.0)

## 2010-10-18 LAB — VITAMIN B12: Vitamin B-12: 239 pg/mL (ref 211–911)

## 2010-10-18 LAB — RETICULOCYTES: Retic Ct Pct: 3.6 % — ABNORMAL HIGH (ref 0.4–3.1)

## 2010-10-18 LAB — PROTIME-INR: INR: 1.39 (ref 0.00–1.49)

## 2010-10-18 LAB — APTT: aPTT: 25 seconds (ref 24–37)

## 2010-10-18 LAB — TROPONIN I: Troponin I: 0.03 ng/mL (ref 0.00–0.06)

## 2010-10-18 LAB — PREPARE RBC (CROSSMATCH)

## 2010-10-19 LAB — CBC
HCT: 31.7 % — ABNORMAL LOW (ref 36.0–46.0)
HCT: 33.8 % — ABNORMAL LOW (ref 36.0–46.0)
Hemoglobin: 10.3 g/dL — ABNORMAL LOW (ref 12.0–15.0)
Hemoglobin: 10.4 g/dL — ABNORMAL LOW (ref 12.0–15.0)
Hemoglobin: 10.8 g/dL — ABNORMAL LOW (ref 12.0–15.0)
MCHC: 33.5 g/dL (ref 30.0–36.0)
MCHC: 33.9 g/dL (ref 30.0–36.0)
MCHC: 34.5 g/dL (ref 30.0–36.0)
MCV: 89.3 fL (ref 78.0–100.0)
MCV: 90.1 fL (ref 78.0–100.0)
Platelets: 267 10*3/uL (ref 150–400)
Platelets: 269 10*3/uL (ref 150–400)
Platelets: 272 10*3/uL (ref 150–400)
RBC: 2.94 MIL/uL — ABNORMAL LOW (ref 3.87–5.11)
RBC: 3.35 MIL/uL — ABNORMAL LOW (ref 3.87–5.11)
RBC: 3.47 MIL/uL — ABNORMAL LOW (ref 3.87–5.11)
RBC: 3.54 MIL/uL — ABNORMAL LOW (ref 3.87–5.11)
RDW: 13.8 % (ref 11.5–15.5)
RDW: 14.6 % (ref 11.5–15.5)
RDW: 15.1 % (ref 11.5–15.5)
WBC: 12.8 10*3/uL — ABNORMAL HIGH (ref 4.0–10.5)
WBC: 13.4 10*3/uL — ABNORMAL HIGH (ref 4.0–10.5)
WBC: 13.4 10*3/uL — ABNORMAL HIGH (ref 4.0–10.5)
WBC: 14.1 10*3/uL — ABNORMAL HIGH (ref 4.0–10.5)

## 2010-10-19 LAB — GLUCOSE, CAPILLARY
Glucose-Capillary: 103 mg/dL — ABNORMAL HIGH (ref 70–99)
Glucose-Capillary: 135 mg/dL — ABNORMAL HIGH (ref 70–99)
Glucose-Capillary: 160 mg/dL — ABNORMAL HIGH (ref 70–99)
Glucose-Capillary: 326 mg/dL — ABNORMAL HIGH (ref 70–99)
Glucose-Capillary: 367 mg/dL — ABNORMAL HIGH (ref 70–99)
Glucose-Capillary: 369 mg/dL — ABNORMAL HIGH (ref 70–99)
Glucose-Capillary: 381 mg/dL — ABNORMAL HIGH (ref 70–99)
Glucose-Capillary: 452 mg/dL — ABNORMAL HIGH (ref 70–99)
Glucose-Capillary: 61 mg/dL — ABNORMAL LOW (ref 70–99)

## 2010-10-19 LAB — BASIC METABOLIC PANEL
BUN: 15 mg/dL (ref 6–23)
CO2: 29 mEq/L (ref 19–32)
Chloride: 101 mEq/L (ref 96–112)
Chloride: 101 mEq/L (ref 96–112)
Creatinine, Ser: 1.02 mg/dL (ref 0.4–1.2)
GFR calc Af Amer: >60 mL/min (ref >60)
Glucose, Bld: 174 mg/dL — ABNORMAL HIGH (ref 70–99)
Glucose, Bld: 246 mg/dL — ABNORMAL HIGH (ref 70–99)
Potassium: 3.5 mEq/L (ref 3.5–5.1)

## 2010-10-19 LAB — COMPREHENSIVE METABOLIC PANEL
ALT: 14 U/L (ref 0–35)
AST: 16 U/L (ref 0–37)
Albumin: 3.3 g/dL — ABNORMAL LOW (ref 3.5–5.2)
Alkaline Phosphatase: 57 U/L (ref 39–117)
Potassium: 3.4 mEq/L — ABNORMAL LOW (ref 3.5–5.1)
Sodium: 133 mEq/L — ABNORMAL LOW (ref 135–145)
Total Protein: 7.4 g/dL (ref 6.0–8.3)

## 2010-10-19 LAB — DIFFERENTIAL
Basophils Relative: 0 % (ref 0–1)
Basophils Relative: 0 % (ref 0–1)
Eosinophils Absolute: 0 10*3/uL (ref 0.0–0.7)
Eosinophils Absolute: 0.1 10*3/uL (ref 0.0–0.7)
Monocytes Absolute: 0.3 10*3/uL (ref 0.1–1.0)
Monocytes Absolute: 0.7 10*3/uL (ref 0.1–1.0)
Monocytes Relative: 2 % — ABNORMAL LOW (ref 3–12)
Monocytes Relative: 7 % (ref 3–12)
Neutrophils Relative %: 72 % (ref 43–77)

## 2010-10-19 LAB — CROSSMATCH
ABO/RH(D): B POS
Antibody Screen: NEGATIVE

## 2010-10-19 LAB — URINE CULTURE: Culture: NO GROWTH

## 2010-10-19 LAB — CARDIAC PANEL(CRET KIN+CKTOT+MB+TROPI)
CK, MB: 4.8 ng/mL — ABNORMAL HIGH (ref 0.3–4.0)
Relative Index: INVALID (ref 0.0–2.5)
Relative Index: INVALID (ref 0.0–2.5)
Total CK: 85 U/L (ref 7–177)
Troponin I: 0.4 ng/mL — ABNORMAL HIGH (ref 0.00–0.06)
Troponin I: 0.57 ng/mL (ref 0.00–0.06)

## 2010-10-19 LAB — URINALYSIS, ROUTINE W REFLEX MICROSCOPIC
Nitrite: NEGATIVE
Protein, ur: NEGATIVE mg/dL
Urobilinogen, UA: 0.2 mg/dL (ref 0.0–1.0)

## 2010-10-19 LAB — TSH: TSH: 0.422 u[IU]/mL (ref 0.350–4.500)

## 2010-10-19 LAB — POCT CARDIAC MARKERS: Myoglobin, poc: 60.4 ng/mL (ref 12–200)

## 2010-10-19 LAB — URINE MICROSCOPIC-ADD ON

## 2010-10-19 LAB — BRAIN NATRIURETIC PEPTIDE: Pro B Natriuretic peptide (BNP): 31 pg/mL (ref 0.0–100.0)

## 2010-10-19 LAB — MAGNESIUM: Magnesium: 2 mg/dL (ref 1.5–2.5)

## 2010-10-19 LAB — CK TOTAL AND CKMB (NOT AT ARMC): Relative Index: INVALID (ref 0.0–2.5)

## 2010-10-19 LAB — LIPID PANEL: VLDL: 20 mg/dL (ref 0–40)

## 2010-10-19 LAB — PROTIME-INR: Prothrombin Time: 16.2 seconds — ABNORMAL HIGH (ref 11.6–15.2)

## 2010-10-21 LAB — GLUCOSE, CAPILLARY
Glucose-Capillary: 130 mg/dL — ABNORMAL HIGH (ref 70–99)
Glucose-Capillary: 170 mg/dL — ABNORMAL HIGH (ref 70–99)
Glucose-Capillary: 182 mg/dL — ABNORMAL HIGH (ref 70–99)
Glucose-Capillary: 187 mg/dL — ABNORMAL HIGH (ref 70–99)
Glucose-Capillary: 205 mg/dL — ABNORMAL HIGH (ref 70–99)
Glucose-Capillary: 228 mg/dL — ABNORMAL HIGH (ref 70–99)
Glucose-Capillary: 229 mg/dL — ABNORMAL HIGH (ref 70–99)
Glucose-Capillary: 289 mg/dL — ABNORMAL HIGH (ref 70–99)

## 2010-10-21 LAB — COMPREHENSIVE METABOLIC PANEL
ALT: 11 U/L (ref 0–35)
AST: 13 U/L (ref 0–37)
CO2: 27 mEq/L (ref 19–32)
Calcium: 8.5 mg/dL (ref 8.4–10.5)
Chloride: 97 mEq/L (ref 96–112)
GFR calc Af Amer: >60 mL/min (ref >60)
GFR calc non Af Amer: >60 mL/min (ref >60)
Glucose, Bld: 274 mg/dL — ABNORMAL HIGH (ref 70–99)
Sodium: 131 mEq/L — ABNORMAL LOW (ref 135–145)
Total Bilirubin: 0.5 mg/dL (ref 0.3–1.2)

## 2010-10-21 LAB — CARDIAC PANEL(CRET KIN+CKTOT+MB+TROPI)
CK, MB: 1.6 ng/mL (ref 0.3–4.0)
Relative Index: INVALID (ref 0.0–2.5)
Relative Index: INVALID (ref 0.0–2.5)
Total CK: 80 U/L (ref 7–177)
Troponin I: 0.09 ng/mL — ABNORMAL HIGH (ref 0.00–0.06)

## 2010-10-21 LAB — DIFFERENTIAL
Lymphocytes Relative: 11 % — ABNORMAL LOW (ref 12–46)
Lymphs Abs: 2.2 10*3/uL (ref 0.7–4.0)
Monocytes Absolute: 0.6 10*3/uL (ref 0.1–1.0)
Monocytes Relative: 5 % (ref 3–12)
Monocytes Relative: 5 % (ref 3–12)
Neutro Abs: 10.1 10*3/uL — ABNORMAL HIGH (ref 1.7–7.7)
Neutro Abs: 9.6 10*3/uL — ABNORMAL HIGH (ref 1.7–7.7)
Neutrophils Relative %: 77 % (ref 43–77)
Neutrophils Relative %: 83 % — ABNORMAL HIGH (ref 43–77)

## 2010-10-21 LAB — CBC
Hemoglobin: 8.6 g/dL — ABNORMAL LOW (ref 12.0–15.0)
MCHC: 33.5 g/dL (ref 30.0–36.0)
MCHC: 33.9 g/dL (ref 30.0–36.0)
Platelets: 268 10*3/uL (ref 150–400)
RBC: 2.8 MIL/uL — ABNORMAL LOW (ref 3.87–5.11)
RBC: 2.86 MIL/uL — ABNORMAL LOW (ref 3.87–5.11)
RBC: 3.78 MIL/uL — ABNORMAL LOW (ref 3.87–5.11)
RBC: 3.99 MIL/uL (ref 3.87–5.11)
WBC: 12.1 10*3/uL — ABNORMAL HIGH (ref 4.0–10.5)
WBC: 12.5 10*3/uL — ABNORMAL HIGH (ref 4.0–10.5)
WBC: 13.7 10*3/uL — ABNORMAL HIGH (ref 4.0–10.5)

## 2010-10-21 LAB — BASIC METABOLIC PANEL
BUN: 11 mg/dL (ref 6–23)
CO2: 24 mEq/L (ref 19–32)
CO2: 26 mEq/L (ref 19–32)
Calcium: 8.4 mg/dL (ref 8.4–10.5)
Calcium: 8.6 mg/dL (ref 8.4–10.5)
Calcium: 8.8 mg/dL (ref 8.4–10.5)
Chloride: 100 mEq/L (ref 96–112)
Chloride: 103 mEq/L (ref 96–112)
Creatinine, Ser: 0.93 mg/dL (ref 0.4–1.2)
Creatinine, Ser: 0.95 mg/dL (ref 0.4–1.2)
GFR calc Af Amer: >60 mL/min (ref >60)
GFR calc Af Amer: >60 mL/min (ref >60)
GFR calc Af Amer: >60 mL/min (ref >60)
GFR calc non Af Amer: 59 mL/min — ABNORMAL LOW (ref >60)
Glucose, Bld: 184 mg/dL — ABNORMAL HIGH (ref 70–99)
Sodium: 131 mEq/L — ABNORMAL LOW (ref 135–145)

## 2010-10-21 LAB — HEMOGLOBIN AND HEMATOCRIT, BLOOD
HCT: 27.5 % — ABNORMAL LOW (ref 36.0–46.0)
Hemoglobin: 9.2 g/dL — ABNORMAL LOW (ref 12.0–15.0)

## 2010-10-21 LAB — TYPE AND SCREEN: Antibody Screen: NEGATIVE

## 2010-10-21 LAB — POCT CARDIAC MARKERS
CKMB, poc: <1 ng/mL — ABNORMAL LOW (ref 1.0–8.0)
Myoglobin, poc: 63.7 ng/mL (ref 12–200)
Troponin i, poc: <0.05 ng/mL (ref 0.00–0.09)

## 2010-10-21 LAB — WET PREP, GENITAL: Clue Cells Wet Prep HPF POC: NONE SEEN

## 2010-10-21 LAB — CK TOTAL AND CKMB (NOT AT ARMC): Relative Index: INVALID (ref 0.0–2.5)

## 2010-10-21 LAB — GC/CHLAMYDIA PROBE AMP, GENITAL: GC Probe Amp, Genital: NEGATIVE

## 2010-10-21 LAB — MRSA PCR SCREENING: MRSA by PCR: NEGATIVE

## 2010-10-21 LAB — MAGNESIUM: Magnesium: 2 mg/dL (ref 1.5–2.5)

## 2010-10-21 LAB — APTT: aPTT: 32 seconds (ref 24–37)

## 2010-10-21 LAB — BRAIN NATRIURETIC PEPTIDE: Pro B Natriuretic peptide (BNP): 54 pg/mL (ref 0.0–100.0)

## 2010-10-22 ENCOUNTER — Telehealth (INDEPENDENT_AMBULATORY_CARE_PROVIDER_SITE_OTHER): Payer: Self-pay | Admitting: Internal Medicine

## 2010-10-23 ENCOUNTER — Encounter (INDEPENDENT_AMBULATORY_CARE_PROVIDER_SITE_OTHER): Payer: Self-pay | Admitting: *Deleted

## 2010-10-23 NOTE — Progress Notes (Unsigned)
NAMEANNALICIA, [name] NO.:  [id number]  MEDICAL RECORD NO.:  [id number]           PATIENT TYPE:  A  LOCATION:  [facility]                   FACILITY:  [facility]  PHYSICIAN:  Andrew Au, MD        DATE OF BIRTH:  [birth year]  DATE OF SERVICE:  10/09/2010                                 CLINIC NOTE  The patient is a 55 year old morbidly obese African American female who [facility] well as [facility], Dr. Rex Kras.  She sees Dr. Lia Foyer at [facility].  She has multiple medical problems including insulin-dependent diabetes and some kind of cardiovascular blockage that the cardiologist told her is afraid to do any kind of stenting because she does not want to use anesthesia on her. She has morbid obesity.  She has hypertension.  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  She said she received several transfusions because of dysfunctional uterine bleeding. She has fibroid tumors, but I have no results on the chart to tell me whether or not this lady is menopausal.  She had been treated with Megace as well as I think Provera in the past.  At the present time, she has no bleeding and has had no bleeding recently.  She still has some Megace to take for a few days.  I am going to check her [place] and LH.  I am going to also get a CBC on her and an estradiol.  I do not know whether she is at the far end of the menopausal bell curve or not because I think treatment would dictate what to use to control her bleeding and how anemic and if she is taking supplemental iron.  I think that if she is not menopausal we would consider Mirena IUD,  endometrial ablation with regional anesthesia.  Provera 10 days a month or Depo- Provera.  If she is menopausal, I would just stop everything and see whether or not she starts bleeding and as I think that the conservative treatment on this lady considering the cardiac status  which I do not quite understand is significant.  The ultrasound that she had had previously this was from August 2011.  She had a uterus measuring 10.3 x 7.7 x 7.4 with multiple fibroids identified.  Endometrium that was 11.5. She since had an endometrial biopsy which showed hormonal effect exogenous and otherwise was normal.  She has bilateral hydrosalpinx and the only thing state about her ovaries is that they are unchanged.  The impression is carrier postmenopausal bleeding by history, anemia by history, insulin-dependent diabetes, and diabetic retinopathy, chronic cardiac disease, hypertension, hyper lipidemia.          ______________________________ Andrew Au, MD    PR/MEDQ  D:  10/09/2010  T:  10/10/2010  Job:  [id number]

## 2010-10-25 LAB — DIFFERENTIAL
Basophils Absolute: 0 10*3/uL (ref 0.0–0.1)
Basophils Relative: 0 % (ref 0–1)
Lymphocytes Relative: 22 % (ref 12–46)
Monocytes Absolute: 0.9 10*3/uL (ref 0.1–1.0)
Neutro Abs: 8.3 10*3/uL — ABNORMAL HIGH (ref 1.7–7.7)
Neutrophils Relative %: 70 % (ref 43–77)

## 2010-10-25 LAB — PROTIME-INR
INR: 1.27 (ref 0.00–1.49)
Prothrombin Time: 15.8 seconds — ABNORMAL HIGH (ref 11.6–15.2)

## 2010-10-25 LAB — CBC
HCT: 31.8 % — ABNORMAL LOW (ref 36.0–46.0)
MCHC: 33.1 g/dL (ref 30.0–36.0)
MCV: 89.3 fL (ref 78.0–100.0)
Platelets: 267 10*3/uL (ref 150–400)
Platelets: 317 10*3/uL (ref 150–400)
RDW: 14.9 % (ref 11.5–15.5)
RDW: 15.4 % (ref 11.5–15.5)

## 2010-10-25 LAB — CARDIAC PANEL(CRET KIN+CKTOT+MB+TROPI)
Relative Index: INVALID (ref 0.0–2.5)
Troponin I: <0.01 ng/mL (ref 0.00–0.06)

## 2010-10-25 LAB — GLUCOSE, CAPILLARY
Glucose-Capillary: 195 mg/dL — ABNORMAL HIGH (ref 70–99)
Glucose-Capillary: 234 mg/dL — ABNORMAL HIGH (ref 70–99)
Glucose-Capillary: 266 mg/dL — ABNORMAL HIGH (ref 70–99)

## 2010-10-25 LAB — LIPID PANEL
Total CHOL/HDL Ratio: 2.7 RATIO
VLDL: 19 mg/dL (ref 0–40)

## 2010-10-25 LAB — POCT I-STAT, CHEM 8
BUN: 11 mg/dL (ref 6–23)
Calcium, Ion: 0.82 mmol/L — ABNORMAL LOW (ref 1.12–1.32)
Chloride: 107 mEq/L (ref 96–112)
HCT: 45 % (ref 36.0–46.0)
Potassium: 4.2 mEq/L (ref 3.5–5.1)
Sodium: 134 mEq/L — ABNORMAL LOW (ref 135–145)

## 2010-10-25 LAB — POCT CARDIAC MARKERS
Myoglobin, poc: 76.1 ng/mL (ref 12–200)
Troponin i, poc: <0.05 ng/mL (ref 0.00–0.09)

## 2010-10-25 LAB — BASIC METABOLIC PANEL
BUN: 12 mg/dL (ref 6–23)
Chloride: 99 mEq/L (ref 96–112)
Creatinine, Ser: 0.93 mg/dL (ref 0.4–1.2)
Glucose, Bld: 269 mg/dL — ABNORMAL HIGH (ref 70–99)
Potassium: 3.6 mEq/L (ref 3.5–5.1)

## 2010-10-25 LAB — TSH: TSH: 0.645 u[IU]/mL (ref 0.350–4.500)

## 2010-10-25 LAB — APTT: aPTT: 31 seconds (ref 24–37)

## 2010-10-25 LAB — HEMOGLOBIN A1C: Hgb A1c MFr Bld: 7.7 % — ABNORMAL HIGH (ref 4.6–6.1)

## 2010-10-29 NOTE — Progress Notes (Signed)
Summary: Syringes need RX to [facility]  Phone Note Call from Patient   Caller: Patient Summary of Call: tell Dr. Amil Amen, need 100 unit syringes, at [facility] on [facility], ok call pat please Initial call taken by: Elgie Collard,  October 22, 2010 3:45 PM    Prescriptions: MONOJECT INSULIN SYRINGE 30G X 5/16" 1 ML MISC (INSULIN SYRINGE-NEEDLE U-100) two times a day insulin injections  #100 x 3   Entered by:   [name] RN   Authorized by:   Mack Hook MD   Signed by:   [name] RN on 10/22/2010   Method used:   [facility]        [facility]       [address]       [place], [place]  [address]       Ph: [phone]       Fax: [phone]   RxID:   [id number]

## 2010-10-29 NOTE — Letter (Signed)
Summary: *HSN Results Follow up  [facility]  [address] [place], [place] [address]   Phone: [phone]  Fax: [phone]      10/23/2010   [name] [address] [place], [place]  [address]  [place]   Dear  [name],                            Comments: WHEN YOU CAME TO [facility] 10-01-10 TO SEE DR MULBERRY SHE MENTION TO YOU THAT THE NUTRITIONIST WILL CALL YOU THEY BEING TRYNG TO CONTACT YOU TO SCHEDULE AN APPT PLEASE CALLED [phone] TO [place] AN APPT AT [place].       _________________________________________________________ If you have any questions, please contact our office                     Sincerely,  Maren Reamer [facility]

## 2010-10-30 ENCOUNTER — Ambulatory Visit: Payer: Medicare Other | Admitting: Physician Assistant

## 2010-10-30 DIAGNOSIS — N920 Excessive and frequent menstruation with regular cycle: Secondary | ICD-10-CM

## 2010-10-31 NOTE — Progress Notes (Signed)
[name], RICHENS NO.:  [id number]  MEDICAL RECORD NO.:  [id number]           PATIENT TYPE:  A  LOCATION:  [facility]                   FACILITY:  [facility]  PHYSICIAN:  Malcolm Metro, CNM    DATE OF BIRTH:  1956/07/04  DATE OF SERVICE:  10/30/2010                                 CLINIC NOTE  The patient is being seen in [facility] at [facility].  Reason for today's visit is followup results from blood work.  HISTORY OF PRESENT ILLNESS:  The patient is a 55 year old morbidly obese African American female who is followed by [facility], Dr. Amil Amen and Cardiology, Dr. Rex Kras for multiple health concerns.  Her problem list includes, 1. Insulin-dependent diabetes. 2. Cardiovascular blockage. 3. Hyperlipidemia. 4. Morbid obesity. 5. Diabetic retinopathy. 6. Fibroid tumors with dysfunctional uterine bleeding.  The patient returns today for lab work today were drawn on October 09, 2010, by Dr. Kalman Shan to determine whether the patient is postmenopausal and also to assess her anemia.  Blood work results are as follows; her hemoglobin is 13.1, hematocrit is 38.1, her estradiol level is 21.3 indicating that she is in a postmenopausal state.  The patient reports that she has had no bleeding since January 2012 and has minimal cramping on an irregular basis that is relieved with 1000 mg of Tylenol.  I have discussed with the patient that given that she is no longer having any bleeding that I do not feel that she needs to have an IUD placed at this point or endometrial ablation that has been previously discussed, and currently is not in need of Provera or Depo-Provera for bleeding control.  I agree that she should stop everything and conservative management considering her cardiac standpoint.  The patient is in agreement with this.  ASSESSMENT:  Abnormal uterine bleeding postmenopausal, resolved.  PLAN:  The patient should follow up with her primary care  providers for the continued management of her health concerns and return to [name] as needed with issues of bleeding or for her annual physicals and Pap smears.  Given the patient's age and history, she is not needed for another Pap smear until September 2013.          ______________________________ Malcolm Metro, CNM    SS/MEDQ  D:  10/30/2010  T:  10/31/2010  Job:  [id number]

## 2010-11-03 LAB — CBC
HCT: 34.8 % — ABNORMAL LOW (ref 36.0–46.0)
Platelets: 307 10*3/uL (ref 150–400)
RDW: 15.7 % — ABNORMAL HIGH (ref 11.5–15.5)

## 2010-11-03 LAB — POCT I-STAT, CHEM 8
BUN: 10 mg/dL (ref 6–23)
Sodium: 136 mEq/L (ref 135–145)
TCO2: 26 mmol/L (ref 0–100)

## 2010-11-03 LAB — DIFFERENTIAL
Basophils Absolute: 0 10*3/uL (ref 0.0–0.1)
Eosinophils Relative: 0 % (ref 0–5)
Lymphocytes Relative: 15 % (ref 12–46)
Neutro Abs: 8.5 10*3/uL — ABNORMAL HIGH (ref 1.7–7.7)

## 2010-11-05 LAB — POCT I-STAT, CHEM 8
BUN: 13 mg/dL (ref 6–23)
Calcium, Ion: 1.04 mmol/L — ABNORMAL LOW (ref 1.12–1.32)
Chloride: 103 mEq/L (ref 96–112)
Glucose, Bld: 345 mg/dL — ABNORMAL HIGH (ref 70–99)

## 2010-11-05 LAB — URINALYSIS, ROUTINE W REFLEX MICROSCOPIC
Ketones, ur: 40 mg/dL — AB
Protein, ur: NEGATIVE mg/dL
Urobilinogen, UA: 0.2 mg/dL (ref 0.0–1.0)

## 2010-11-05 LAB — GLUCOSE, CAPILLARY
Glucose-Capillary: 109 mg/dL — ABNORMAL HIGH (ref 70–99)
Glucose-Capillary: 118 mg/dL — ABNORMAL HIGH (ref 70–99)
Glucose-Capillary: 165 mg/dL — ABNORMAL HIGH (ref 70–99)
Glucose-Capillary: 175 mg/dL — ABNORMAL HIGH (ref 70–99)
Glucose-Capillary: 195 mg/dL — ABNORMAL HIGH (ref 70–99)
Glucose-Capillary: 269 mg/dL — ABNORMAL HIGH (ref 70–99)
Glucose-Capillary: 105 mg/dL — ABNORMAL HIGH (ref 70–99)
Glucose-Capillary: 214 mg/dL — ABNORMAL HIGH (ref 70–99)
Glucose-Capillary: 221 mg/dL — ABNORMAL HIGH (ref 70–99)

## 2010-11-05 LAB — URINE CULTURE: Colony Count: 70000

## 2010-11-05 LAB — COMPREHENSIVE METABOLIC PANEL
ALT: 10 U/L (ref 0–35)
Alkaline Phosphatase: 67 U/L (ref 39–117)
CO2: 23 mEq/L (ref 19–32)
Calcium: 8.8 mg/dL (ref 8.4–10.5)
Chloride: 101 mEq/L (ref 96–112)
GFR calc non Af Amer: >60 mL/min (ref >60)
Glucose, Bld: 360 mg/dL — ABNORMAL HIGH (ref 70–99)
Sodium: 133 mEq/L — ABNORMAL LOW (ref 135–145)
Total Bilirubin: 0.6 mg/dL (ref 0.3–1.2)

## 2010-11-05 LAB — BASIC METABOLIC PANEL
BUN: 15 mg/dL (ref 6–23)
BUN: 15 mg/dL (ref 6–23)
CO2: 25 mEq/L (ref 19–32)
CO2: 24 mEq/L (ref 19–32)
Calcium: 8.9 mg/dL (ref 8.4–10.5)
Calcium: 9.4 mg/dL (ref 8.4–10.5)
Chloride: 100 mEq/L (ref 96–112)
Chloride: 98 mEq/L (ref 96–112)
GFR calc Af Amer: >60 mL/min (ref >60)
GFR calc Af Amer: >60 mL/min (ref >60)
GFR calc Af Amer: >60 mL/min (ref >60)
GFR calc non Af Amer: 53 mL/min — ABNORMAL LOW (ref >60)
GFR calc non Af Amer: 60 mL/min — ABNORMAL LOW (ref >60)
GFR calc non Af Amer: >60 mL/min (ref >60)
Glucose, Bld: 173 mg/dL — ABNORMAL HIGH (ref 70–99)
Glucose, Bld: 246 mg/dL — ABNORMAL HIGH (ref 70–99)
Glucose, Bld: 398 mg/dL — ABNORMAL HIGH (ref 70–99)
Potassium: 3.3 mEq/L — ABNORMAL LOW (ref 3.5–5.1)
Potassium: 3.6 mEq/L (ref 3.5–5.1)
Potassium: 3.5 mEq/L (ref 3.5–5.1)
Sodium: 131 mEq/L — ABNORMAL LOW (ref 135–145)
Sodium: 135 mEq/L (ref 135–145)
Sodium: 135 mEq/L (ref 135–145)

## 2010-11-05 LAB — POCT CARDIAC MARKERS
CKMB, poc: 1.5 ng/mL (ref 1.0–8.0)
CKMB, poc: 1.6 ng/mL (ref 1.0–8.0)
Troponin i, poc: <0.05 ng/mL (ref 0.00–0.09)

## 2010-11-05 LAB — CARDIAC PANEL(CRET KIN+CKTOT+MB+TROPI)
CK, MB: 4 ng/mL (ref 0.3–4.0)
CK, MB: 4.8 ng/mL — ABNORMAL HIGH (ref 0.3–4.0)
Relative Index: 5.1 — ABNORMAL HIGH (ref 0.0–2.5)
Relative Index: INVALID (ref 0.0–2.5)
Relative Index: INVALID (ref 0.0–2.5)
Total CK: 72 U/L (ref 7–177)
Total CK: 91 U/L (ref 7–177)
Troponin I: 0.41 ng/mL — ABNORMAL HIGH (ref 0.00–0.06)

## 2010-11-05 LAB — RAPID URINE DRUG SCREEN, HOSP PERFORMED
Barbiturates: NOT DETECTED
Opiates: POSITIVE — AB
Tetrahydrocannabinol: NOT DETECTED

## 2010-11-05 LAB — D-DIMER, QUANTITATIVE: D-Dimer, Quant: 0.53 ug/mL-FEU — ABNORMAL HIGH (ref 0.00–0.48)

## 2010-11-05 LAB — CBC
HCT: 35.1 % — ABNORMAL LOW (ref 36.0–46.0)
Hemoglobin: 11.1 g/dL — ABNORMAL LOW (ref 12.0–15.0)
Hemoglobin: 11.7 g/dL — ABNORMAL LOW (ref 12.0–15.0)
Hemoglobin: 11.7 g/dL — ABNORMAL LOW (ref 12.0–15.0)
Hemoglobin: 13.1 g/dL (ref 12.0–15.0)
MCHC: 33.6 g/dL (ref 30.0–36.0)
MCV: 87.1 fL (ref 78.0–100.0)
Platelets: 244 10*3/uL (ref 150–400)
RBC: 3.78 MIL/uL — ABNORMAL LOW (ref 3.87–5.11)
RBC: 4.54 MIL/uL (ref 3.87–5.11)
RDW: 14.8 % (ref 11.5–15.5)
RDW: 14.9 % (ref 11.5–15.5)
RDW: 15.3 % (ref 11.5–15.5)

## 2010-11-05 LAB — DIFFERENTIAL
Basophils Absolute: 0 10*3/uL (ref 0.0–0.1)
Lymphocytes Relative: 14 % (ref 12–46)
Monocytes Absolute: 0.5 10*3/uL (ref 0.1–1.0)
Monocytes Relative: 4 % (ref 3–12)
Neutro Abs: 11.9 10*3/uL — ABNORMAL HIGH (ref 1.7–7.7)

## 2010-11-05 LAB — LIPID PANEL
HDL: 41 mg/dL (ref >39)
Total CHOL/HDL Ratio: 3.6 RATIO
Triglycerides: 50 mg/dL (ref <150)
VLDL: 10 mg/dL (ref 0–40)

## 2010-11-05 LAB — URINE MICROSCOPIC-ADD ON

## 2010-11-05 LAB — CK TOTAL AND CKMB (NOT AT ARMC): Relative Index: INVALID (ref 0.0–2.5)

## 2010-11-05 LAB — HEMOGLOBIN A1C: Hgb A1c MFr Bld: 10.8 % — ABNORMAL HIGH (ref 4.6–6.1)

## 2010-11-07 LAB — HEMOGLOBIN A1C: Mean Plasma Glucose: 214 mg/dL

## 2010-11-07 LAB — BASIC METABOLIC PANEL
BUN: 10 mg/dL (ref 6–23)
CO2: 26 mEq/L (ref 19–32)
CO2: 27 mEq/L (ref 19–32)
Calcium: 8.3 mg/dL — ABNORMAL LOW (ref 8.4–10.5)
Calcium: 8.6 mg/dL (ref 8.4–10.5)
Chloride: 101 mEq/L (ref 96–112)
Chloride: 103 mEq/L (ref 96–112)
Creatinine, Ser: 0.85 mg/dL (ref 0.4–1.2)
Creatinine, Ser: 0.87 mg/dL (ref 0.4–1.2)
GFR calc Af Amer: >60 mL/min (ref >60)
GFR calc Af Amer: >60 mL/min (ref >60)
GFR calc non Af Amer: >60 mL/min (ref >60)
Potassium: 3.9 mEq/L (ref 3.5–5.1)
Sodium: 134 mEq/L — ABNORMAL LOW (ref 135–145)
Sodium: 136 mEq/L (ref 135–145)

## 2010-11-07 LAB — GLUCOSE, CAPILLARY
Glucose-Capillary: 117 mg/dL — ABNORMAL HIGH (ref 70–99)
Glucose-Capillary: 156 mg/dL — ABNORMAL HIGH (ref 70–99)
Glucose-Capillary: 162 mg/dL — ABNORMAL HIGH (ref 70–99)
Glucose-Capillary: 177 mg/dL — ABNORMAL HIGH (ref 70–99)
Glucose-Capillary: 224 mg/dL — ABNORMAL HIGH (ref 70–99)
Glucose-Capillary: 249 mg/dL — ABNORMAL HIGH (ref 70–99)
Glucose-Capillary: 291 mg/dL — ABNORMAL HIGH (ref 70–99)
Glucose-Capillary: 317 mg/dL — ABNORMAL HIGH (ref 70–99)
Glucose-Capillary: 321 mg/dL — ABNORMAL HIGH (ref 70–99)
Glucose-Capillary: 332 mg/dL — ABNORMAL HIGH (ref 70–99)

## 2010-11-07 LAB — DIFFERENTIAL
Eosinophils Absolute: 0.1 10*3/uL (ref 0.0–0.7)
Lymphs Abs: 1.7 10*3/uL (ref 0.7–4.0)
Monocytes Absolute: 0.7 10*3/uL (ref 0.1–1.0)
Monocytes Relative: 6 % (ref 3–12)
Neutrophils Relative %: 77 % (ref 43–77)

## 2010-11-07 LAB — COMPREHENSIVE METABOLIC PANEL
ALT: 11 U/L (ref 0–35)
AST: 15 U/L (ref 0–37)
Albumin: 2.9 g/dL — ABNORMAL LOW (ref 3.5–5.2)
Alkaline Phosphatase: 49 U/L (ref 39–117)
Alkaline Phosphatase: 56 U/L (ref 39–117)
BUN: 13 mg/dL (ref 6–23)
Calcium: 8.4 mg/dL (ref 8.4–10.5)
Chloride: 100 mEq/L (ref 96–112)
GFR calc Af Amer: >60 mL/min (ref >60)
GFR calc non Af Amer: 59 mL/min — ABNORMAL LOW (ref >60)
Glucose, Bld: 281 mg/dL — ABNORMAL HIGH (ref 70–99)
Glucose, Bld: 322 mg/dL — ABNORMAL HIGH (ref 70–99)
Potassium: 3.5 mEq/L (ref 3.5–5.1)
Potassium: 3.7 mEq/L (ref 3.5–5.1)
Sodium: 134 mEq/L — ABNORMAL LOW (ref 135–145)
Total Bilirubin: 0.6 mg/dL (ref 0.3–1.2)
Total Protein: 6.6 g/dL (ref 6.0–8.3)
Total Protein: 7.4 g/dL (ref 6.0–8.3)

## 2010-11-07 LAB — POCT I-STAT, CHEM 8
BUN: 18 mg/dL (ref 6–23)
Calcium, Ion: 1.12 mmol/L (ref 1.12–1.32)
Creatinine, Ser: 0.8 mg/dL (ref 0.4–1.2)
Hemoglobin: 9.2 g/dL — ABNORMAL LOW (ref 12.0–15.0)
Sodium: 134 mEq/L — ABNORMAL LOW (ref 135–145)
TCO2: 23 mmol/L (ref 0–100)

## 2010-11-07 LAB — CROSSMATCH

## 2010-11-07 LAB — CBC
HCT: 33.1 % — ABNORMAL LOW (ref 36.0–46.0)
Hemoglobin: 11.1 g/dL — ABNORMAL LOW (ref 12.0–15.0)
Hemoglobin: 8.2 g/dL — ABNORMAL LOW (ref 12.0–15.0)
Hemoglobin: 9.2 g/dL — ABNORMAL LOW (ref 12.0–15.0)
Hemoglobin: 9.9 g/dL — ABNORMAL LOW (ref 12.0–15.0)
MCHC: 32.6 g/dL (ref 30.0–36.0)
MCHC: 33.1 g/dL (ref 30.0–36.0)
MCHC: 33.3 g/dL (ref 30.0–36.0)
MCHC: 33.7 g/dL (ref 30.0–36.0)
MCV: 83.7 fL (ref 78.0–100.0)
MCV: 83.9 fL (ref 78.0–100.0)
MCV: 84.1 fL (ref 78.0–100.0)
Platelets: 342 10*3/uL (ref 150–400)
RBC: 2.98 MIL/uL — ABNORMAL LOW (ref 3.87–5.11)
RBC: 3.21 MIL/uL — ABNORMAL LOW (ref 3.87–5.11)
RBC: 3.54 MIL/uL — ABNORMAL LOW (ref 3.87–5.11)
RBC: 3.61 MIL/uL — ABNORMAL LOW (ref 3.87–5.11)
RDW: 17.2 % — ABNORMAL HIGH (ref 11.5–15.5)
RDW: 17.6 % — ABNORMAL HIGH (ref 11.5–15.5)
WBC: 11.2 10*3/uL — ABNORMAL HIGH (ref 4.0–10.5)
WBC: 11.6 10*3/uL — ABNORMAL HIGH (ref 4.0–10.5)

## 2010-11-07 LAB — CARDIAC PANEL(CRET KIN+CKTOT+MB+TROPI)
CK, MB: 0.8 ng/mL (ref 0.3–4.0)
Relative Index: INVALID (ref 0.0–2.5)
Relative Index: INVALID (ref 0.0–2.5)
Relative Index: INVALID (ref 0.0–2.5)
Total CK: 50 U/L (ref 7–177)
Total CK: 75 U/L (ref 7–177)
Troponin I: 0.02 ng/mL (ref 0.00–0.06)
Troponin I: 0.02 ng/mL (ref 0.00–0.06)
Troponin I: 0.23 ng/mL — ABNORMAL HIGH (ref 0.00–0.06)

## 2010-11-07 LAB — TROPONIN I
Troponin I: 0.01 ng/mL (ref 0.00–0.06)
Troponin I: 0.12 ng/mL — ABNORMAL HIGH (ref 0.00–0.06)
Troponin I: 0.16 ng/mL — ABNORMAL HIGH (ref 0.00–0.06)

## 2010-11-07 LAB — PREGNANCY, URINE: Preg Test, Ur: NEGATIVE

## 2010-11-07 LAB — CK TOTAL AND CKMB (NOT AT ARMC)
CK, MB: 0.8 ng/mL (ref 0.3–4.0)
Relative Index: INVALID (ref 0.0–2.5)
Total CK: 43 U/L (ref 7–177)

## 2010-11-07 LAB — APTT: aPTT: 32 seconds (ref 24–37)

## 2010-11-07 LAB — HEPARIN LEVEL (UNFRACTIONATED)
Heparin Unfractionated: 0.4 IU/mL (ref 0.30–0.70)
Heparin Unfractionated: 0.53 IU/mL (ref 0.30–0.70)
Heparin Unfractionated: 0.65 IU/mL (ref 0.30–0.70)

## 2010-11-07 LAB — PROTIME-INR: INR: 1.3 (ref 0.00–1.49)

## 2010-11-07 LAB — BRAIN NATRIURETIC PEPTIDE: Pro B Natriuretic peptide (BNP): 35 pg/mL (ref 0.0–100.0)

## 2010-11-07 LAB — POCT CARDIAC MARKERS: Myoglobin, poc: 43.2 ng/mL (ref 12–200)

## 2010-11-08 LAB — RAPID URINE DRUG SCREEN, HOSP PERFORMED
Amphetamines: NOT DETECTED
Opiates: NOT DETECTED
Tetrahydrocannabinol: NOT DETECTED

## 2010-11-08 LAB — COMPREHENSIVE METABOLIC PANEL
AST: 23 U/L (ref 0–37)
BUN: 9 mg/dL (ref 6–23)
CO2: 25 mEq/L (ref 19–32)
Calcium: 9 mg/dL (ref 8.4–10.5)
Chloride: 100 mEq/L (ref 96–112)
Creatinine, Ser: 0.8 mg/dL (ref 0.4–1.2)
GFR calc Af Amer: >60 mL/min (ref >60)
GFR calc non Af Amer: >60 mL/min (ref >60)
Total Bilirubin: 0.3 mg/dL (ref 0.3–1.2)

## 2010-11-08 LAB — BASIC METABOLIC PANEL
BUN: 12 mg/dL (ref 6–23)
BUN: 9 mg/dL (ref 6–23)
CO2: 24 mEq/L (ref 19–32)
CO2: 24 mEq/L (ref 19–32)
CO2: 29 mEq/L (ref 19–32)
Calcium: 8.7 mg/dL (ref 8.4–10.5)
Calcium: 9 mg/dL (ref 8.4–10.5)
Chloride: 97 mEq/L (ref 96–112)
Creatinine, Ser: 0.78 mg/dL (ref 0.4–1.2)
GFR calc Af Amer: >60 mL/min (ref >60)
GFR calc Af Amer: >60 mL/min (ref >60)
GFR calc non Af Amer: >60 mL/min (ref >60)
GFR calc non Af Amer: >60 mL/min (ref >60)
GFR calc non Af Amer: >60 mL/min (ref >60)
Glucose, Bld: 154 mg/dL — ABNORMAL HIGH (ref 70–99)
Glucose, Bld: 297 mg/dL — ABNORMAL HIGH (ref 70–99)
Potassium: 3.7 mEq/L (ref 3.5–5.1)
Sodium: 130 mEq/L — ABNORMAL LOW (ref 135–145)
Sodium: 136 mEq/L (ref 135–145)

## 2010-11-08 LAB — LIPID PANEL
Cholesterol: 158 mg/dL (ref 0–200)
LDL Cholesterol: 109 mg/dL — ABNORMAL HIGH (ref 0–99)
Triglycerides: 64 mg/dL (ref <150)

## 2010-11-08 LAB — FOLATE: Folate: >20 ng/mL

## 2010-11-08 LAB — HIV ANTIBODY (ROUTINE TESTING W REFLEX): HIV: NONREACTIVE

## 2010-11-08 LAB — CBC
HCT: 26.8 % — ABNORMAL LOW (ref 36.0–46.0)
HCT: 29.2 % — ABNORMAL LOW (ref 36.0–46.0)
Hemoglobin: 9.1 g/dL — ABNORMAL LOW (ref 12.0–15.0)
Hemoglobin: 9.2 g/dL — ABNORMAL LOW (ref 12.0–15.0)
MCHC: 33.1 g/dL (ref 30.0–36.0)
MCHC: 33.8 g/dL (ref 30.0–36.0)
MCHC: 35.3 g/dL (ref 30.0–36.0)
MCV: 86.3 fL (ref 78.0–100.0)
MCV: 86.4 fL (ref 78.0–100.0)
MCV: 86.5 fL (ref 78.0–100.0)
Platelets: 263 10*3/uL (ref 150–400)
Platelets: 279 10*3/uL (ref 150–400)
Platelets: 307 10*3/uL (ref 150–400)
RBC: 3.12 MIL/uL — ABNORMAL LOW (ref 3.87–5.11)
RBC: 3.38 MIL/uL — ABNORMAL LOW (ref 3.87–5.11)
RDW: 15 % (ref 11.5–15.5)
RDW: 15.1 % (ref 11.5–15.5)
RDW: 15.3 % (ref 11.5–15.5)
WBC: 11.4 10*3/uL — ABNORMAL HIGH (ref 4.0–10.5)
WBC: 12.4 10*3/uL — ABNORMAL HIGH (ref 4.0–10.5)

## 2010-11-08 LAB — GLUCOSE, CAPILLARY
Glucose-Capillary: 167 mg/dL — ABNORMAL HIGH (ref 70–99)
Glucose-Capillary: 302 mg/dL — ABNORMAL HIGH (ref 70–99)
Glucose-Capillary: 334 mg/dL — ABNORMAL HIGH (ref 70–99)
Glucose-Capillary: 350 mg/dL — ABNORMAL HIGH (ref 70–99)
Glucose-Capillary: 369 mg/dL — ABNORMAL HIGH (ref 70–99)
Glucose-Capillary: 384 mg/dL — ABNORMAL HIGH (ref 70–99)
Glucose-Capillary: 403 mg/dL — ABNORMAL HIGH (ref 70–99)

## 2010-11-08 LAB — DIFFERENTIAL
Basophils Absolute: 0 10*3/uL (ref 0.0–0.1)
Lymphocytes Relative: 14 % (ref 12–46)
Lymphs Abs: 1.8 10*3/uL (ref 0.7–4.0)
Neutro Abs: 10.7 10*3/uL — ABNORMAL HIGH (ref 1.7–7.7)
Neutrophils Relative %: 81 % — ABNORMAL HIGH (ref 43–77)

## 2010-11-08 LAB — TROPONIN I
Troponin I: 0.21 ng/mL — ABNORMAL HIGH (ref 0.00–0.06)
Troponin I: 0.4 ng/mL — ABNORMAL HIGH (ref 0.00–0.06)

## 2010-11-08 LAB — CARDIAC PANEL(CRET KIN+CKTOT+MB+TROPI)
CK, MB: 2.2 ng/mL (ref 0.3–4.0)
CK, MB: 2.7 ng/mL (ref 0.3–4.0)
CK, MB: 3.4 ng/mL (ref 0.3–4.0)
Relative Index: 1.3 (ref 0.0–2.5)
Relative Index: 1.7 (ref 0.0–2.5)
Relative Index: INVALID (ref 0.0–2.5)
Total CK: 133 U/L (ref 7–177)
Total CK: 170 U/L (ref 7–177)
Total CK: 179 U/L — ABNORMAL HIGH (ref 7–177)
Total CK: 205 U/L — ABNORMAL HIGH (ref 7–177)
Troponin I: 0.01 ng/mL (ref 0.00–0.06)
Troponin I: 0.27 ng/mL — ABNORMAL HIGH (ref 0.00–0.06)
Troponin I: 0.35 ng/mL — ABNORMAL HIGH (ref 0.00–0.06)

## 2010-11-08 LAB — HEMOGLOBIN A1C: Mean Plasma Glucose: 240 mg/dL

## 2010-11-08 LAB — IRON AND TIBC
Iron: 20 ug/dL — ABNORMAL LOW (ref 42–135)
TIBC: 346 ug/dL (ref 250–470)
UIBC: 326 ug/dL

## 2010-11-08 LAB — BRAIN NATRIURETIC PEPTIDE: Pro B Natriuretic peptide (BNP): 57 pg/mL (ref 0.0–100.0)

## 2010-11-08 LAB — CK TOTAL AND CKMB (NOT AT ARMC)
CK, MB: 5 ng/mL — ABNORMAL HIGH (ref 0.3–4.0)
Total CK: 102 U/L (ref 7–177)

## 2010-11-08 LAB — URINALYSIS, ROUTINE W REFLEX MICROSCOPIC
Leukocytes, UA: NEGATIVE
Nitrite: NEGATIVE
Specific Gravity, Urine: 1.025 (ref 1.005–1.030)
Urobilinogen, UA: 1 mg/dL (ref 0.0–1.0)
pH: 7 (ref 5.0–8.0)

## 2010-11-08 LAB — FERRITIN: Ferritin: 27 ng/mL (ref 10–291)

## 2010-11-08 LAB — HEPARIN LEVEL (UNFRACTIONATED)
Heparin Unfractionated: 0.17 IU/mL — ABNORMAL LOW (ref 0.30–0.70)
Heparin Unfractionated: 0.49 IU/mL (ref 0.30–0.70)

## 2010-11-08 LAB — CROSSMATCH

## 2010-11-08 LAB — URINE MICROSCOPIC-ADD ON

## 2010-11-08 LAB — RETICULOCYTES: Retic Count, Absolute: 64.3 10*3/uL (ref 19.0–186.0)

## 2010-12-15 NOTE — Discharge Summary (Signed)
[name], [name] NO.:  [id number]   MEDICAL RECORD NO.:  [id number]          PATIENT TYPE:  INP   LOCATION:  [id number]                         FACILITY:  [facility]   PHYSICIAN:  Alcide Evener, MD  DATE OF BIRTH:  [birth year]   DATE OF ADMISSION:  02/25/2009  DATE OF DISCHARGE:  03/04/2009                               DISCHARGE SUMMARY   CHIEF COMPLAINT:  Chest pain.   DISCHARGE DIAGNOSES:  1. Non-ST segment myocardial infarction.  2. Postmenopausal bleeding.  3. Insulin-dependent type 2 diabetes.  4. Hypertension.  5. Hyperlipidemia.   DISCHARGE MEDICATIONS:  1. Aspirin 325 p.o. daily.  2. Plavix 75 p.o. daily.  3. Simvastatin 40 mg p.o. daily.  4. Metoprolol XL 150 mg p.o. daily.  5. Hyzaar 100/25 mg p.o. daily.  6. Ferrous sulfate 325 mg p.o. twice daily.  7. NovoLog 70/30, 50 units q.a.m., 50 units at bedtime subcutaneous      injection.  8. Nitroglycerin 0.4 mg sublingual.  9. Diltiazem 120 mg p.o. daily.  10.Hydralazine 25 mg p.o. t.i.d.  11.Imdur 60 mg daily.   DISPOSITION AND FOLLOWUP:  1. August 9 at 3 p.m. appointment with Dr. Charlesetta Garibaldi at [facility] for postmenopausal bleeding and potential endometrial      biopsy.  2. Appointment with Dr. Rex Kras at [facility] on August 17 at      11:30 a.m.  3. Appointment with Dr. Amil Amen at [facility] on August      19 at 10:30 a.m.   PROCEDURES PERFORMED:  1. Cardiac catheterization performed on February 27, 2009, revealed      Inferior wall motion abnormality.  Diffuse coronary disease      involving the RCA circumflex in the first diagonal.  No renal      artery stenosis.  No abdominal aortic aneurysm.   CONSULTATIONS:  1. Cardiology.  Recommended ongoing treatment plan for NSTEMI.  2. CT Surgery evaluated the patient for potential CABG procedure.  The      patient was deemed to be nonoperative given diffusely poor vessels.   HISTORY AND PHYSICAL:  This is a  55 year old woman with past medical  history of hyperlipidemia, hypertension, and insulin-dependent type 2  diabetes who presented with increased chest pain was found to have no  EKG changes, but had elevated cardiac biomarkers.  Received cardiac  catheterization, which revealed diffuse coronary disease not amenable to  PCI or CABG.  The patient was put on adequate medical therapy during her  stay and was planned to be continued on discharge.   HOSPITAL COURSE:  1. NSTEMI.  The patient admitted with NSTEMI and started on heparin      drip, aspirin, clopidogrel, statin, metoprolol throughout her stay.      Her cardiac enzymes slowly trended downward and her chest pain      resolved.  Cardiac catheterization revealed diffuse multivessel      disease.  She was evaluated by CVTS and was determine not to be a      CABG candidate.  Medical managemnt for secondary prevention will  be      very important for Ms. Burns in the future.  She was counseled      extensively about control of hypertension and diabetes.  2. Hypertension.  The patient was significantly hypertensive on      admission.  On discharge, her hypertensive regimen includes      metoprolol, Hyzaar, diltiazem, hydralazine, and Imdur.  3. Insulin-dependent type 2 diabetes.  The patient with hemoglobin A1c      greater than 10 on admission.  Her insulin therapy was increased      and she was provided with diabetes education in hosptial.  The      importance of [facility] was      stressed to her.  4. Postmenopausal bleeding.  The patient was evaluated by transvaginal      ultrasound and was found to have a second endometrium measuring 2.5      mm.  She will follow up as an outpatient with Dr. Charlesetta Garibaldi at      [place].  5. Hyperlipidemia.  The patient was started on simvastatin.   DISCHARGE LABORATORY DATA:  CBC on March 03, 2009, white blood cell  11.6, hemoglobin 9.9, hematocrit  30.2, and platelets of 319.  Chemistry,  sodium 134, potassium 3.9, chloride 103, CO2 of 27, glucose 185, BUN 9,  creatinine 0.77, troponin level at discharge was 0.12 down from 0.42 at  its maximum.   DISCHARGE VITALS:  Temperature 97.6, pulse 68, respirations 18, blood  pressure 140/60, and oxygen saturation 97% on room air.      Myrtis Ser, MD  Electronically Signed      Alcide Evener, MD  Electronically Signed    CW/MEDQ  D:  03/04/2009  T:  03/05/2009  Job:  [id number]   cc:   Naima A. Charlesetta Garibaldi, M.D.  Jeanella Craze. Little, M.D.  Marcelino Duster, M.D.

## 2010-12-15 NOTE — Op Note (Signed)
[name], [name]              ACCOUNT NO.:  [id number]   MEDICAL RECORD NO.:  [id number]          PATIENT TYPE:  AMB   LOCATION:  SDS                          FACILITY:  [facility]   PHYSICIAN:  Clent Demark. Rankin, M.D.   DATE OF BIRTH:  [birth year]   DATE OF PROCEDURE:  02/19/2008  DATE OF DISCHARGE:  02/19/2008                               OPERATIVE REPORT   PREOPERATIVE DIAGNOSES:  1. Iris bombe, left eye - recurrent x5.  2. Recurrent angle-closure glaucoma, left eye x5.  3. 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   PROCEDURES:  1. Surgical peripheral iridectomy - keyhole formation, left eye.  2. Posterior synechiolysis, left eye.   SURGEON:  Hurman Horn, MD.   ANESTHESIA:  Local retrobulbar monitored anesthesia control.   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  She has had peripheral iridectomies done with the laser in the past in  the left eye on four previous occasions and has had recurrence for the  fifth time.  This is an attempt to perform a large keyhole iridectomy,  so as to preclude, then prevent further recurrences of iris bombe with  the attendant risk of profound vision loss in the left eye.  The patient  understands the risk of anesthesia including rare occurrence of death,  loss of the eye including but not limited to hemorrhage, infection,  scarring, need for surgery, no change in vision, loss of vision, and  progression of disease despite intervention.  Appropriate signed consent  was obtained and the patient was taken to the operating room.   In the operating room, appropriate  monitors followed by mild sedation.  A 2% Xylocaine injected, 4 mL retrobulbar with additional 4 mL injected  laterally and subcutaneously in a modified van Lint fashion.  The left  periocular regions were sterilely prepped and draped in the usual  sterile fashion.  Lid speculum applied.  A 15-degree Superblade was then  used to create a paracentesis incision inferotemporally.  Anterior  chamber was deepened with Viscoat.  The lysis of the pupil posterior  synechial adhesions of the lens capsule was then carried out.  Anterior  chamber remained deep.  This injection of Viscoat posterior to the iris  plane with the iris to below anteriorly at the 12 o'clock position.  Keratotomy blade was then used to make a very small clear corneal  incision.  Colibri forceps were then used to grasp the peripheral iris  at the side of the previous laser peripheral iridectomy.  Large sector  iridectomy was then carried out in a keyhole fashion.  The iris were  very positive.  No complications occurred.  Further lysis of adhesions  from this incisions superiorly was then carried out using the blunt  dissection with the cyclodialysis spatula.  The pupils freed completely.  At this time, the 12 o'clock incisions was closed with single 10-  0 nylon interrupted suture.  The knot was rotated to bury.  The wound  was secured.  Subconjunctival Decadron applied superiorly.  Sterile  patch and Fox shield were applied.  The patient taken to the short stay  area, discharged home as an outpatient without complications.      Clent Demark Rankin, M.D.  Electronically Signed     GAR/MEDQ  D:  02/19/2008  T:  02/20/2008  Job:  [id number]

## 2010-12-15 NOTE — Cardiovascular Report (Signed)
[name], [name] NO.:  [id number]   MEDICAL RECORD NO.:  [id number]          PATIENT TYPE:  INP   LOCATION:  2916                         FACILITY:  [facility]   PHYSICIAN:  Jeanella Craze. Little, M.D. DATE OF BIRTH:  11/02/1955   DATE OF PROCEDURE:  02/27/2009  DATE OF DISCHARGE:                            CARDIAC CATHETERIZATION   INDICATIONS FOR TEST:  This 55 year old insulin-dependent diabetic was  admitted with a non-Q-wave MI.  She has had continued mild chest pain  and was brought to the cath lab for cardiac catheterization.   After obtaining informed consent, the patient was prepped and draped in  the usual sterile fashion.  Following local anesthetic, attempts at  entering the right femoral artery were unsuccessful.  A Smart needle was  used.  Adequate flow was noted to the Smart needle, but I could never  pass the guidewire into the femoral artery.  Finally after 45 minutes,  Dr. Burt Knack was able to enter the left femoral artery.  A 5-French  introducer sheath was placed and left and right coronary arteriography,  ventriculography, and a distal aortogram were performed.   COMPLICATIONS:  None.   MEDICATIONS:  The patient was given 50 mg of IV Benadryl.   TOTAL CONTRAST USED:  140 mL.   RESULTS:  1. Hemodynamic monitoring:  Her central aortic pressure was 159/93.      Her left ventricular pressure was 160/27, and there was no      significant gradient noted at the time of pullback.  2. Ventriculography:  Ventriculography in the RAO projection using 25      mL of contrast revealed the inferior wall to be severely      hypokinetic to akinetic.  The anterior wall and apical segments      showed good contractility.  The ejection fraction was approximately      50%.  The end-diastolic pressure was 24.  No mitral regurgitation      was appreciated.  3. Distal aortogram:  Distal aortogram done at the level of renal      artery showed no evidence for renal  artery stenosis and no      abdominal aortic aneurysm.  The iliacs and femoral arteries were      not adequately visualized.  4:  Coronary arteriography:  A.  On fluoroscopy, calcification was noted in the left main.  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  B.  LAD:  The LAD extended down to the apex of the heart and was by far  the best of her vessels.  There was mid less than 40% irregularities in  the LAD.  The first diagonal was small with ostial 70% and the remainder  of the diagonal was diffusely diseased.  C.  Circumflex:  The circumflex gave rise to what appears to be three OM  vessels.  The circumflex itself was totally occluded in about its  midportion.  The first OM was very small with a mid area of 80%  narrowing.  The second and the third OM vessels appear to be occluded  from the native circulation.  There was late filling of the OM-2 and OM-  3 distally.  These vessels appeared to be very small.  D.  Right coronary artery:  This vessel was diffusely diseased.  There  is proximal 80% narrowing, mid sequential 80% and 90% narrowing, distal  90% narrowing.  The PDA was occluded.  There are two small posterior  lateral vessels.   CONCLUSION:  1. Inferior wall motion abnormality, but despite this was relatively      low normal ejection fraction.  2. Diffuse coronary disease involving the RCA circumflex and the first      diagonal.  3. No renal artery stenosis.  4. No abdominal aortic aneurysm.   DISCUSSION:  Clearly CABG would be the best therapy for this lady;  however, I am not sure there are distal targets for bypass.  I will ask  CVTS to render an opinion.  Medical therapy is the only option.  There  is nothing in the circumflex that is amenable to intervention.  The  first diagonal is too small for intervention and the RCA although having  some areas that could possibly be treated with PCI, supplies are totally  occluded PDA, which would be a very little value should we try  to open  the right without having some runoff in the PDA vessel.   At this point, she is transferred to the holding area.  We will ask CVTS  for an opinion.           ______________________________  Jeanella Craze Little, M.D.     ABL/MEDQ  D:  02/27/2009  T:  02/28/2009  Job:  [id number]   cc:   Tery Sanfilippo, MD  Evette Doffing, M.D.  Cath Lab

## 2010-12-15 NOTE — Op Note (Signed)
NAMESICLALY, [name]              ACCOUNT NO.:  [id number]   MEDICAL RECORD NO.:  [id number]          PATIENT TYPE:  AMB   LOCATION:  SDS                          FACILITY:  [facility]   PHYSICIAN:  Clent Demark. Rankin, M.D.   DATE OF BIRTH:  05/16/1956   DATE OF PROCEDURE:  11/06/2007  DATE OF DISCHARGE:                               OPERATIVE REPORT   PREOPERATIVE DIAGNOSES:  1. Tractional detachment of the left eye.  2. 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  3. Progressive proliferative diabetic retinopathy of the left eye.   POSTOPERATIVE DIAGNOSES:  1. Tractional detachment of the left eye.  2. 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  3. Progressive proliferative diabetic retinopathy of the left eye.  4. 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   PROCEDURE:  1. Posterior vitrectomy with membrane peel - epiretinal membrane - 25      gauge with endolaser panphotocoagulation to repair complex retinal      detachment  - 25-gauge.  1. Posterior synechialysis left eye.   SURGEON:  Clent Demark. Rankin, M.D.   ANESTHESIA:  Local retrobulbar anesthesia with monitored anesthetic  control.   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   The patient understands this is an attempt to remove the cicatricial  scar tissue that was leading to retinal detachments and threatening the  macular vision.  She understands the chronic nature and progressive  nature of this disorder, and the need for surgical intervention to  decrease the progression of vision loss.  The patient understands the  risks of anesthesia, rare occurrence of  death, loss  of the eye  including but not limited to hemorrhage, infection, scar, need for  another surgery,  no change in vision, loss of vision, progressive  disease despite intervention.  Appropriate signed consent was obtained.   The patient was taken to the operating room.  In the operating room.  In  the operating room, appropriate monitors followed by mild sedation.  Xylocaine 2% injected 5 cc retrobulbar, for additional 5 cc laterally in  the fashion of modified Kirk Ruths.  The left periocular region was  sterilely prepped and draped in the usual ophthalmic fashion.  A lid  speculum was applied.  A 25-gauge trocar was placed in the  inferotemporal quadrant.  Superior trocar was applied.  Core vitrectomy  was begun.  Physician induced vitreopapillary traction release was  induced using suction, and the traction was released in the optic nerve  and dissection was carried out.  The very dense vitreoretinal  attachments were noted posterior to the equator 360 degrees.  There were  multiple neovascularization sites in these areas both at the 6, 5, 12,  and 11 o'clock positions.  This dissection was  then carried down into  the equator 360 degrees, release of all traction.  Rhegmatogenous  component was found to be present in the atrophic portions of the retina  both inferior and superior to the optic nerve.  Small posterior  retinotomies were fashioned with the vitrectomy instrumentation and  fluid-fluid exchange carried out to remove thick subretinal fluid.  Thereafter, fluid air exchange completed.  Under repeated removal of  reaccumulated subretinal fluid, the laser photocoagulation was placed in  the 360 degrees as well as in the bed of the detachments as well as  around the retinotomy sites inferior and superior.  The retina actually  attached.  Perfusion was maintained.  At this time, an air-SF6 20%  exchange completed.  The superior trocars were removed.  The infusion  was  removed.  The intraocular pressure was assessed and found to be  adequate.  Subconjunctival Decadron applied.  Sterile patch and Fox  shield applied.  The patient tolerated the procedure without  complications.      Clent Demark Rankin, M.D.  Electronically Signed     GAR/MEDQ  D:  11/06/2007  T:  11/06/2007  Job:  [id number]

## 2010-12-15 NOTE — Op Note (Signed)
[name], Burns              ACCOUNT NO.:  [id number]   MEDICAL RECORD NO.:  [id number]          PATIENT TYPE:  AMB   LOCATION:  SDS                          FACILITY:  [facility]   PHYSICIAN:  Clent Demark. Rankin, M.D.   DATE OF BIRTH:  10-01-1955   DATE OF PROCEDURE:  01/15/2008  DATE OF DISCHARGE:                               OPERATIVE REPORT   PREOPERATIVE DIAGNOSES:  1. Tractional detachment of the right eye.  2. Rhegmatogenous detachments of the right eye.  3. Progressive proliferative diabetic retinopathy, right eye.  4. Dense vitreous hemorrhage, right eye.  5. Dense cataract, right eye.   POSTOPERATIVE DIAGNOSES:  1. Tractional detachment of the right eye.  2. Rhegmatogenous detachments of the right eye.  3. Progressive proliferative diabetic retinopathy, right eye.  4. Dense vitreous hemorrhage, right eye.  5. Dense cataract, right eye.   PROCEDURE:  1. Posterior tractional membrane peel - severe fibrovascular disease,      OD - 20 gauge.  2. Endolaser pan photocoagulation for retinopexy and ablation      purposes.  3. Pars plana lensectomy, left eye.  4. Peripheral iridectomy.  5. Endodiathermy for retinopathy.  6. Injection of vitreous substitute - permanent - silicone oil [id number]      [place].   SURGEON:  Clent Demark. Rankin, M.D.   ANESTHESIA:  Local retrobulbar and monitored anesthesia control.   INDICATIONS FOR PROCEDURE:  The patient is a 55 year old woman with  neglected diabetic retinopathy of each eye who has undergone previous  retinal surgery of the left eye, and now has more advanced proliferative  diabetic retinopathy of the right eye, laser combined traction, and  rhegmatogenous detachments of the right eye.  The patient understands  this is an attempt to reattach retina.  She understands the need for  surgical intervention.  She understands the risks of anesthesia  including the rare occurrence of death but also to the eye including  progression of  disease despite intervention, hemorrhage, infection,  scarring, need for another surgery, and complete loss of the eye.  She  understands the risk of anesthesia including rare occurrence of death.   Appropriate signed consent was obtained.  The patient was taken to the  operating room.  In the operating room, appropriate monitors followed by  mild sedation.  A 2% Xylocaine injected retrobulbar with 5 mL, with  additional 5 mL laterally in fashion modified Kirk Ruths.  The right  periocular region was sterilely prepped and draped in the usual sterile  fashion.  Lid speculum was applied.  Conjunctiva was then fashioned  temporally and superiorly.  The infusion cannula was then placed 3.5 mm  posterior to the limbus and the inferotemporal quadrant was secured with  a Mersilene suture.  Sclerotomy was fashioned.  Microscope was  positioned with the biome attachment.  Core vitrectomy was begun.  Notable findings were rather massive nearly fundal detachment with dense  fibrovascular and fibrous traction tension on the macular region optic  nerve along the inferotemporal and supratemporal arcades.  This dense  fibrosis extended on entire surface of the retina and became  so adherent  to the vitreous base and it was then impossible to actually dissect this  area.  Nonetheless, starting out posteriorly, the dissection was carried  down with horizontal scissors, curve scissors, and vertical scissors to  allow removal of dense fibrous tissue that caused complete retinal  detachment.   Holes were formed and fashioned and found at the 9, 9:30, and 10 o'clock  positions.   These were out near the equator.   Fluid-fluid exchange was carried out through this area to remove the  thick subretinal fluid.  Diathermy was then used to cauterize the  bleeding sites temporarily.  Diathermy was also use to create a  retinotomy inferonasal and nasal to the optic nerve.  Fluid exchange  carried out in this area.   The retina was nicely mobile.  Fluid-air  exchange completed out.  Reaccumulated fluid was reaspirated  periodically.  Retina remained nicely attached.  At this time, endolaser  photocoagulation was placed in ablation purposes as well retinopexy as  well as around the retinotomy sites of the breaks as previously  mentioned.  It was difficult to perform the fibrous dissection mentioned  above through a cataract and thus the cataract is removed.  Posterior  capsule was entered, phacofragmatome was then used to remove the lens in  the bag.  The anterior capsule was also removed so as to also to prevent  a seclusion of oil into the anterior chamber.   The inferior peripheral iridectomy was fashioned surgically.  This was  in fact a large tracheal iridectomy because of her known risk of having  formation of scarring develop again.   For this reason and at this time, fluid exchange was completed.  Under  air, passive instillation of silicone oil was carried out.  The retina  remained nicely attached.  Appropriate oil fills was obtained.  Superior  sclerotomies were closed with 7-0 Vicryl sutures.  The infusion removed  and similarly closed with 7-0 Vicryl.  Conjunctiva was brought forward  and closed with 7-0 Vicryl.  Subconjunctival Decadron applied.  Sterile  patch and Fox shield applied.  The patient tolerated the procedure  without complication.  She was taken to the PACU in good and stable  condition.      Clent Demark Rankin, M.D.  Electronically Signed     GAR/MEDQ  D:  01/15/2008  T:  01/16/2008  Job:  [id number]

## 2010-12-15 NOTE — H&P (Signed)
NAMESHAKYAH, OLESKI NO.:  [id number]   MEDICAL RECORD NO.:  [id number]          PATIENT TYPE:  EMS   LOCATION:  ED                           FACILITY:  [facility]   PHYSICIAN:  Durwin Nora, MDDATE OF BIRTH:  [birth year]   DATE OF ADMISSION:  06/16/2007  DATE OF DISCHARGE:                              HISTORY & PHYSICAL   PRIMARY CARE PHYSICIAN:  [facility].   CHIEF COMPLAINT:  Weakness, vomiting, chills, diarrhea of one day  duration.   HISTORY OF PRESENTING COMPLAINT:  This is a 55 year old African American  female with a past medical history of diabetes and hypertension who was  quite well until earlier in the morning when she started having dizzy  spells with more than 4 episodes of vomiting, more than 2 episodes of  diarrhea associated with chills,  extremely thirsty with tingling in her  fingertips, and also experiencing blurred vision especially with reduced  visual acuity in the right eye.  The patient denies any chest pain,  although she has a dry cough.  No palpitations, no shortness of breath.  The patient has not been compliant with her insulin.  She is not taking  her Lantus insulin for the last one week.  The patient states it makes  her feel bad.  Denies any new skin rash or joint swelling, polyuria, no  dysuria or hematuria.   PAST MEDICAL HISTORY:  1. Diabetes since 1985.  2. Hypertension since 1988.   PAST SURGICAL HISTORY:  Partial hysterectomy in 1980.   SOCIAL HISTORY:  She is married.  No history of drug, alcohol, or  tobacco abuse.   FAMILY HISTORY:  Significant for mother with diabetes and hypertension.  Two sisters also have diabetes and hypertension.   No known drug allergies.   HOME MEDICATIONS:  1. Lisinopril HCTZ, dosage is not known.  2. Toprol XL.  3. Insulin NPH alternating with regular.  4. Lantus insulin 50 units daily and Lantus 15 units in the evening.  5. Regular insulin 8 units in the a.m.; 8 units p.m.;  10 units h.s.   PHYSICAL EXAMINATION:  GENERAL:  She is a middle-aged lady not in acute  respiratory distress.  Clinically dehydrated.  Not pale.  VITAL SIGNS:  Temperature 97.2, pulse is 84, respiratory rate 20, blood  pressure 134/70.  HEENT:  Pupils equal and reactive to light and accommodation.  Reduced  visual acuity bilateral eyes.  The oropharynx and nasopharynx are clear.  NECK:  Supple.  There is no elevated JVD or thyromegaly.  No carotid  bruit.  HEAD:  Atraumatic.  LUNGS:  Clear.  HEART:  Sounds 1 and 2.  Regular rate and rhythm.  ABDOMEN:  There is truncal obesity.  Abdomen is soft, nontender.  No  organomegaly.  NEUROLOGIC:  She is alert, oriented x3.  Cranial nerves II-XII intact.  She has reduced sensation in the extremities.  Peripheral pulses  present.  There is no pedal edema.   LABORATORY:  Shows  platelet count 311, WBC4.1 potassium 4.4,  bicarbonate 25, glucose 556.  PLAN:  IV Cipro500 mg twice a  day also put on Lovenox 40mg  s/c for DVT  prophylaxis, Reglan 10 mg q.6 hours p.r.n. nausea      Durwin Nora, MD  Electronically Signed     MIO/MEDQ  D:  06/17/2007  T:  06/18/2007  Job:  [id number]

## 2010-12-15 NOTE — Discharge Summary (Signed)
NAMESHEQUITTA, [name]              ACCOUNT NO.:  [id number]   MEDICAL RECORD NO.:  [id number]          PATIENT TYPE:  OBV   LOCATION:  [id number]                         FACILITY:  [facility]   PHYSICIAN:  [name], M.D. DATE OF BIRTH:  [birth year]   DATE OF ADMISSION:  03/12/2009  DATE OF DISCHARGE:  03/14/2009                               DISCHARGE SUMMARY   DISCHARGE DIAGNOSES:  1. Unstable angina, secondary to anemia, improved after transfusion      with a discharge hemoglobin of 11.1.  2. Known coronary disease, non-ST elevation myocardial infarction February 25, 2009, with catheterization showing diffuse distal disease in      the circumflex RCA (right coronary artery) and diagonal, not a      candidate for bypass grafting or angioplasty, plan is for medical      therapy.  3. Normal LV (left ventricle) function with an EF (ejection fraction)      of 60-65%.  4. PSVT (paroxysmal supraventricular tachycardia) with wide complex      tachycardia, asymptomatic.  5. Insulin dependent diabetes.  6. Anemia felt to be secondary to postmenopausal bleeding, the patient      has an appointment with [name] today at 10:45 a.m.  7. Morbid obesity.  8. Treated hypertension.  9. Treated dyslipidemia.   HOSPITAL COURSE:  [name] is a pleasant 55 year old female who was  admitted initially February 25, 2009, to March 04, 2009.  She had had a non-  ST elevation MI.  Catheterization revealed diffuse distal disease with  poor targets.  She was seen in consult by Dr. Cyndia Burns who felt that she  was a poor candidate for bypass grafting.  Dr. Cyndia Burns recommended  continued medical therapy.  The patient was discharged March 04, 2009.  During her hospitalization she did have anemia and apparently has a  history of menorrhagia and dysfunction uterine bleeding.  She did have  some workup done during that admission with a pelvic ultrasound which  revealed thickened heterogeneous endometrium abnormal  for postmenopausal  female.  She is to follow up with [name] and actually has an  appointment today and she will keep this.  The patient went home on  medical therapy and then was readmitted March 12, 2009, with recurrent  chest pain.  Some of her symptoms were somewhat atypical but some  sounded like angina.  Her hemoglobin on admission was 8.2.  She had  admitted to some vaginal bleeding prior to admission.  The patient was  admitted to telemetry.  Initially she was treated for unstable angina  and put on heparin.  Her enzymes were negative.  We stopped her heparin.  She was typed and crossed for 2 units of packed RBCs and given these.  Her hemoglobin came up to 11.1.  She feels much better and has no chest  pain and has been ambulating without problem.  She has not had further  vaginal bleeding.  We feel she can be discharged March 14, 2009, and  keep her appointment with [name].  We did make adjustments  in her  medications prior to discharge.  We stopped her Plavix and put her on  two baby aspirin instead of a full dose aspirin.  We also increased her  nitrates.   DISCHARGE MEDICATIONS:  1. Aspirin 162 mg a day.  2. Simvastatin 40 mg a day.  3. Metoprolol XL 100 mg a day.  4. Hyzaar 100/25 daily.  5. 70/30 insulin 50 units twice a day.  6. Iron 325 mg three times a day with food.  7. Nitroglycerin 0.4 mg sublingual p.r.n.  8. Hydralazine 25 mg t.i.d.  9. Isosorbide mononitrate 90 mg a day.  10.Diltiazem CD 240 mg a day.  11.Protonix 40 mg a day.   LABS:  White count 12.4, hemoglobin 11.1, hematocrit 33.1, platelets  343.  Sodium 133, potassium 3.7, BUN 13, creatinine 0.9 and LFTs were  normal.  Hemoglobin A1c on admission was 9.1.  TSH 0.49.  Troponins were  negative x4.  Chest x-ray shows moderate cardiac enlargement.  No  pulmonary edema.  EKG shows sinus rhythm, minimal voltage criteria for  LVH.  Telemetry shows sinus rhythm.  She did have a 10 beat run of  wide  complex irregular tachycardia at about 110.  She was asymptomatic.   DISPOSITION:  The patient was discharged in stable condition.  She will  follow up with [name] in a couple of weeks.  She will keep her  appointment with [name] today.  I should note that an  echocardiogram done February 26, 2009, showed her EF to be 60-65%.      [name], P.A.    ______________________________  [name], M.D.    [name]  D:  03/14/2009  T:  03/14/2009  Job:  [id number]   cc:   [name], M.D.  [name], M.D.

## 2010-12-15 NOTE — Discharge Summary (Signed)
[name], [name]              ACCOUNT NO.:  [id number]   MEDICAL RECORD NO.:  [id number]          PATIENT TYPE:  INP   LOCATION:  [id number]                         [facility]ACILITY:  [facility]   PHYSICIAN:  Sharlet Salina, M.D.   DATE OF BIRTH:  [birth year]   DATE OF ADMISSION:  06/16/2007  DATE OF DISCHARGE:  06/20/2007                               DISCHARGE SUMMARY   DISCHARGE DIAGNOSES:  1. Weakness, vomiting, chills, diarrhea that have resolved.  2. Uncontrolled diabetes.  3. Hypertension.  4. Anemia.  5. Hypokalemia.  6. Menorrhagia.   DISCHARGE MEDICATIONS:  1. Toprol XL 50 mg daily.  2. Lisinopril/hydrochlorothiazide 20/25 daily.  3. Iron 325 mg daily.  4. KCl 10 mEq daily.  5. Insulin 70/30, 35 units twice daily.  6. Sliding scale insulin with meals.   FOLLOWUP APPOINTMENTS:  The patient to follow up with [facility] in one  week.   CONSULTANTS:  None.   PROCEDURES:  None.   HISTORY OF PRESENT ILLNESS:  The patient is a 55 year old African-  American female with past medical history of diabetes and hypertension  who was quite well until earlier in the morning where she started to  have some dizzy cells which more than 4 episodes of vomiting and 2  episodes of diarrhea with chills.  She had no chest pain or shortness of  breath, no cough, no palpitations.  The patient takes Lantus for her  diabetes, but she states that it makes her feel bad and achy all over.   PAST MEDICAL HISTORY, FAMILY HISTORY, SOCIAL HISTORY. MEDICATIONS,  ALLERGIES, REVIEW OF SYSTEMS:  Per admission H&P.   PHYSICAL EXAMINATION ON DISCHARGE:  VITAL SIGNS:  Temperature 97.9,  pulse of 81, respirations 20, blood pressure 176/83, pulse oximetry 100%  on room air. Blood sugars 111-181.  HEENT:  Head is normocephalic, atraumatic.  Pupils reactive to light.  Throat without erythema.  CARDIOVASCULAR:  Regular rate and rhythm.  LUNGS:  Clear bilaterally.  ABDOMEN:  Positive bowel sounds.  EXTREMITIES:  No  edema.   HOSPITAL COURSE:  #1.  NAUSEA, VOMITING, DIARRHEA:  This is most likely secondary to  gastroenteritis.  The patient's symptoms resolved prior to discharge.  She will be discharged home.   #2.  NORMOCYTIC ANEMIA:  The patient's anemia is most likely due to  blood loss from menses.  Her ferritin is 19.  She will follow up for  outpatient workup with GYN for her menorrhagia.   #3.  DIABETES:  Poorly controlled.  With her symptoms from Lantus,  Lantus was discontinued, and she was started on 70/30 insulin as she has  symptoms with the LANTUS.   #4.  HYPERTENSION:  Blood pressure is elevated.  Restarted her on  hydrochlorothiazide.  She did have some hyponatremia when she came in,  but that is probably secondary to being sick with the nausea, vomiting,  diarrhea, dehydration and continuing with the  lisinopril/hydrochlorothiazide.  Now that she is euvolemic, will restart  that medication.   DISCHARGE LABORATORY DATA:  Fecal lactoferrin is negative.  Sodium 135,  potassium 3.1. The patient received a potassium  replacement prior to  discharge.  Chloride 105, CO2 23, glucose 183, BUN 5, creatinine 0.61.  WBC 10.6, hemoglobin 8.5, platelets 269. Hemoglobin A1c of 12.   Chest x-ray showed no acute cardiopulmonary process.      Sharlet Salina, M.D.  Electronically Signed     NJ/MEDQ  D:  06/20/2007  T:  06/20/2007  Job:  [id number]   cc:   Karoline Caldwell

## 2010-12-15 NOTE — Consult Note (Signed)
NAMECLIDA, [name] NO.:  [id number]   MEDICAL RECORD NO.:  [id number]          PATIENT TYPE:  INP   LOCATION:  2916                         FACILITY:  [facility]   PHYSICIAN:  Gilford Raid, M.D.     DATE OF BIRTH:  Nov 07, 1955   DATE OF CONSULTATION:  02/27/2009  DATE OF DISCHARGE:                                 CONSULTATION   REFERRING PHYSICIAN:  Jeanella Craze. Little, MD   REASON FOR CONSULTATION:  Severe 2-vessel coronary artery disease with  acute coronary syndrome.   CLINICAL HISTORY:  I was asked by Dr. Rex Kras to evaluate [name] for  consideration of coronary artery bypass graft surgery.  She is a 55-year-  old morbidly obese woman with history of uncontrolled diabetes with  severe retinopathy affecting both eyes as well as poorly controlled  hypertension and hyperlipidemia who presented with about a 1-year  history of frequent episodes of chest and upper back pain that radiates  to her left shoulder and down her left arm.  This was associated with  shortness of breath as well as nausea.  These symptoms have occurred  with exertion as well as with rest.  She said that recently, these  symptoms have become more severe.  She was admitted with acute coronary  artery syndrome with a troponin of 0.01 increasing to 0.4.  Her initial  CPK was 70 with MB of 1.3, peaking at 179 with an MB of 4.7.  Electrocardiogram showed nonspecific changes.  She underwent cardiac  catheterization today, which showed severe diffuse 2-vessel coronary  artery disease.  Her vessels were small diabetic type vessel that were  diffusely diseased.  Left main had no significant stenosis, but was  calcified.  The LAD was her largest vessel with nonobstructive disease  in the midportion of less than 40%.  There was a small first diagonal  that had about 70% ostial disease, this vessel was diffusely diseased.  Left circumflex had a small first marginal, which had 80% midvessel  stenosis.   Second and third marginal were occluded and filled faintly by  collaterals from the left, but appeared similar in size or other vessels  which were all small.  The right coronary artery was severely and  diffusely diseased with 80% proximal and 90% distal disease.  Midportion  was diffusely diseased with 80-90% stenosis.  Posterior descending was  occluded with filling by collaterals from the left, but also appeared to  be a small diffusely diseased vessel.  There are 2 small posterolateral  branches.  Ejection fraction was about 50% with inferior wall severe  hypokinesis to akinesis.  End-diastolic pressure was 24.   REVIEW OF SYSTEMS:  As noted in her admission history and physical.   PAST MEDICAL HISTORY:  Significant for:  1. Poorly-controlled diabetes with diabetic retinopathy.  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  She is status      post bilateral cataract removals in the past and multiple surgeries      on both eyes.  2. She has a history  of iron-deficiency anemia.  3. She has a history of menorrhagia and heavy vaginal bleeding.  4. She is status post right oophorectomy and salpingo-oophorectomy as      well as D and C x3.  5. She has history of hypercholesterolemia and poorly-controlled      hypertension.   MEDICATIONS:  As noted on her admission MAR.   SOCIAL HISTORY AND FAMILY HISTORY:  As noted in her admission history  and physical.   PHYSICAL EXAMINATION:  VITAL SIGNS:  Blood pressure 113/62.  Pulse is 93  and regular.  Respiratory rate 16 and unlabored.  She is a morbidly  obese African American female who is in no distress.  HEENT:  Normocephalic and atraumatic.  Pupils are minimally responsive.  Extraocular muscles are intact.  Throat is clear.  NECK:  Normal carotid pulses bilaterally.  There were no bruits.  There  is no adenopathy or thyromegaly.  CARDIAC:  Regular rate and rhythm with normal S1 and S2.  There is no  murmur, rub, or  gallop.  LUNGS:  Clear.  ABDOMEN:  Morbid obesity.  Her abdomen is soft and nontender.  There are  bowel sounds present.  EXTREMITIES:  Moderate edema to mid-calf level.  Pedal pulses are  palpable bilaterally.  SKIN:  Warm and dry.  NEUROLOGIC:  Alert and oriented x3.  Motor and sensory exam is grossly  normal.   IMPRESSION:  [name] has severe 2-vessel coronary artery disease  involving the right coronary artery and left circumflex systems,  presenting with acute coronary syndrome.  Unfortunately, she has small  diffusely diseased vessels.  There are typical of poorly-controlled  diabetics and I am not sure whether much would be graftable if we try to  operate on her.  If there are any graftable vessels, they would be very  small and diffusely diseased and I think the patency rate of grafts  placed with these type vessels would be poor.  She is a very high  surgical risk patient due to morbid obesity and rather comorbid factors  and if we are going to operate on a patient like her, we would not be as  sure as possible that we have a good chance of success.  I do not think  that is the case here due to the small diffusely diseased diabetic type  vessels.  I discussed this with the patient and family, and they seemed  to understand.  I would recommend continued medical therapy or  percutaneous intervention if there is any intervention that is possible  that may help her.      Gilford Raid, M.D.  Electronically Signed     BB/MEDQ  D:  02/27/2009  T:  02/28/2009  Job:  [id number]   cc:   Jeanella Craze. Little, M.D.

## 2010-12-15 NOTE — Op Note (Signed)
[name], [name]              ACCOUNT NO.:  [id number]   MEDICAL RECORD NO.:  [id number]          PATIENT TYPE:  AMB   LOCATION:  SDS                          FACILITY:  [facility]   PHYSICIAN:  Clent Demark. Rankin, M.D.   DATE OF BIRTH:  1955-11-25   DATE OF PROCEDURE:  10/11/2007  DATE OF DISCHARGE:  10/11/2007                               OPERATIVE REPORT   PREOPERATIVE DIAGNOSES:  1. Nuclear sclerotic cataract of the left eye.  2. Combined traction retinal detachment, left eye, from progressive      proliferative diabetic retinopathy, left eye.  Recurrent cataract      removal before planned vitrectomy to repair retinal detachment.   POSTOPERATIVE DIAGNOSES:  1. Nuclear sclerotic cataract of the left eye.  2. Combined traction retinal detachment, left eye, from progressive      proliferative diabetic retinopathy, left eye.  Recurrent cataract      removal before planned vitrectomy to repair retinal detachment.   PROCEDURE:  Phaco extracapsular cataract extraction, left eye with  AcrySof intraocular lens in the bag, Alcon, model SN60WF power +22.5  diopter.   SURGEON:  Clent Demark. Rankin, MD   ANESTHESIA:  Local, Department of Anesthesia control.   INDICATIONS:  The patient is a 55 year old woman with nuclear sclerotic  cataract of the left eye who requires its removal prior to planned  vitrectomy, posterior and repair of retinal detachment from tractional  membrane changes in the left eye.  The patient understands the risks of  anesthesia, including the rare occurrence of death, plus including, but  not limited to  hemorrhage, infection, scarring, need for further  surgery, no change in vision, loss of vision, progressive disease  despite intervention.   Appropriately signed consent was obtained.  The patient was taken to the  operating room. In the operating room appropriate monitors, followed by  mild sedation. Xylocaine 2% injected retrobulbar for an additional 5 mL,  laterally in the fashion of modified Kirk Ruths.  The left periocular  region was sterilely prepped and draped in the usual ophthalmic fashion.  The lid speculum applied.  Diamond knife was then used to create a clear  corneal incision at approximately the 10:30 position.  The anterior  chamber was then deepened with Viscoat.  Capsulorrhexis was started with  a bent cystotome needle and then carried out with the capsular forceps.  Hydrodissection and delineation was then carried out in the bag.  The  phaco fragmentation was then begun.  Limited amount of energy was used.  No complications occurred.  Careful cleanup was then carried out without  difficulty.  All cortex visible was removed.  At this time the AcrySof  lens was then inserted through the small incision without difficulty,  rotating the horizontal position with excellent centration.  Miochol was  then used to bring the pupil down.  Safety stitch with a 10-0 nylon was  then placed singly through the clear corneal incision and the knot was  rotated and buried.  Subconjunctival Decadron applied.  Sterile patch  and Fox shield applied.   The patient tolerated the procedure well,  without complications.  She  was taken to the short stay, and discharged home as an outpatient.      Clent Demark Rankin, M.D.  Electronically Signed     GAR/MEDQ  D:  10/11/2007  T:  10/11/2007  Job:  [id number]

## 2011-04-26 LAB — BASIC METABOLIC PANEL
BUN: 17
Calcium: 9.4
Chloride: 104
GFR calc Af Amer: >60
GFR calc non Af Amer: >60
Glucose, Bld: 135 — ABNORMAL HIGH
Potassium: 3.4 — ABNORMAL LOW
Potassium: 4.5
Sodium: 135
Sodium: 136

## 2011-04-26 LAB — CBC
HCT: 43.6
HCT: 41.7
Hemoglobin: 14.4
Hemoglobin: 13.7
MCV: 83.7
Platelets: 312
RDW: 15.6 — ABNORMAL HIGH
WBC: 13.1 — ABNORMAL HIGH
WBC: 10.3

## 2011-04-27 LAB — CBC
HCT: 39.5
HCT: 40
Hemoglobin: 13.5
MCHC: 33.8
MCHC: 33.8
MCV: 83.6
MCV: 86
Platelets: 302
Platelets: 283
RDW: 17 — ABNORMAL HIGH
RDW: 17.2 — ABNORMAL HIGH
WBC: 12.2 — ABNORMAL HIGH

## 2011-04-27 LAB — COMPREHENSIVE METABOLIC PANEL
Albumin: 3.3 — ABNORMAL LOW
Alkaline Phosphatase: 54
BUN: 7
Calcium: 9.1
Creatinine, Ser: 0.76
Glucose, Bld: 221 — ABNORMAL HIGH
Potassium: 3.8
Total Protein: 7.4

## 2011-04-27 LAB — DIFFERENTIAL
Basophils Relative: 0
Lymphocytes Relative: 12
Monocytes Absolute: 0.7
Monocytes Relative: 5
Neutro Abs: 11.9 — ABNORMAL HIGH
Neutrophils Relative %: 83 — ABNORMAL HIGH

## 2011-04-27 LAB — POCT CARDIAC MARKERS
CKMB, poc: 1.2
Myoglobin, poc: 40.9
Operator id: 277751
Troponin i, poc: <0.05
Troponin i, poc: <0.05

## 2011-04-27 LAB — BASIC METABOLIC PANEL
BUN: 13
Chloride: 99
Creatinine, Ser: 0.76
Glucose, Bld: 111 — ABNORMAL HIGH
Potassium: 3.8

## 2011-04-29 LAB — CBC
MCV: 91
RBC: 3.46 — ABNORMAL LOW
WBC: 12.5 — ABNORMAL HIGH

## 2011-04-29 LAB — BASIC METABOLIC PANEL
BUN: 12
Calcium: 8.8
GFR calc non Af Amer: >60
Glucose, Bld: 218 — ABNORMAL HIGH
Sodium: 135

## 2011-04-30 LAB — BASIC METABOLIC PANEL
CO2: 23
Chloride: 102
Creatinine, Ser: 0.91
GFR calc Af Amer: >60
Potassium: 3.4 — ABNORMAL LOW

## 2011-04-30 LAB — CBC
HCT: 36.1
Hemoglobin: 11.9 — ABNORMAL LOW
MCHC: 33
MCV: 88.5
RBC: 4.08

## 2011-05-11 LAB — CBC
HCT: 24.5 — ABNORMAL LOW
HCT: 24.7 — ABNORMAL LOW
HCT: 30.6 — ABNORMAL LOW
Hemoglobin: 8.5 — ABNORMAL LOW
Hemoglobin: 8.8 — ABNORMAL LOW
Hemoglobin: 10.5 — ABNORMAL LOW
MCHC: 34.4
MCHC: 35.1
MCV: 83.3
MCV: 84.8
Platelets: 259
Platelets: 269
RBC: 2.91 — ABNORMAL LOW
RBC: 3.03 — ABNORMAL LOW
RDW: 14.8
WBC: 10.5
WBC: 10.6 — ABNORMAL HIGH
WBC: 13.8 — ABNORMAL HIGH

## 2011-05-11 LAB — URINALYSIS, ROUTINE W REFLEX MICROSCOPIC
Bilirubin Urine: NEGATIVE
Glucose, UA: >1000 — AB
Ketones, ur: 40 — AB
Leukocytes, UA: NEGATIVE
Protein, ur: NEGATIVE
pH: 5

## 2011-05-11 LAB — APTT: aPTT: 26

## 2011-05-11 LAB — COMPREHENSIVE METABOLIC PANEL
AST: 14
Alkaline Phosphatase: 52
BUN: 8
BUN: 16
CO2: 29
Calcium: 8.2 — ABNORMAL LOW
Chloride: 108
Chloride: 94 — ABNORMAL LOW
Creatinine, Ser: 0.8
GFR calc Af Amer: >60
GFR calc non Af Amer: >60
GFR calc non Af Amer: 55 — ABNORMAL LOW
Glucose, Bld: 596
Potassium: 4.4
Total Bilirubin: 0.6
Total Bilirubin: 1.1
Total Protein: 6.8

## 2011-05-11 LAB — BASIC METABOLIC PANEL
CO2: 29
Calcium: 8.1 — ABNORMAL LOW
Calcium: 8.6
Chloride: 107
Chloride: 111
Creatinine, Ser: 0.84
GFR calc Af Amer: >60
GFR calc non Af Amer: >60
GFR calc non Af Amer: >60
Glucose, Bld: 168 — ABNORMAL HIGH
Glucose, Bld: 183 — ABNORMAL HIGH
Potassium: 3.1 — ABNORMAL LOW
Potassium: 3.3 — ABNORMAL LOW
Sodium: 135
Sodium: 141

## 2011-05-11 LAB — DIFFERENTIAL
Eosinophils Absolute: 0 — ABNORMAL LOW
Eosinophils Relative: 0
Monocytes Absolute: 0.5
Monocytes Relative: 3
Neutro Abs: 16.6 — ABNORMAL HIGH
Neutrophils Relative %: 86 — ABNORMAL HIGH

## 2011-05-11 LAB — BLOOD GAS, ARTERIAL
Bicarbonate: 21.1
Patient temperature: 96.7
pCO2 arterial: 31.8 — ABNORMAL LOW
pH, Arterial: 7.432 — ABNORMAL HIGH
pO2, Arterial: 98

## 2011-05-11 LAB — CULTURE, BLOOD (ROUTINE X 2): Culture: NO GROWTH

## 2011-05-11 LAB — IRON AND TIBC
Saturation Ratios: 13 — ABNORMAL LOW
TIBC: 284
UIBC: 248

## 2011-05-11 LAB — STOOL CULTURE

## 2011-05-11 LAB — PROTIME-INR
INR: 1.3
Prothrombin Time: 16.9 — ABNORMAL HIGH

## 2011-05-11 LAB — HEMOGLOBIN A1C: Hgb A1c MFr Bld: 12 — ABNORMAL HIGH

## 2011-05-11 LAB — FECAL LACTOFERRIN, QUANT: Fecal Lactoferrin: NEGATIVE

## 2011-05-11 LAB — OVA AND PARASITE EXAMINATION: Ova and parasites: NONE SEEN

## 2011-05-11 LAB — RETICULOCYTES
RBC.: 3.08 — ABNORMAL LOW
Retic Count, Absolute: 49.3

## 2011-07-24 IMAGING — CR DG CHEST 2V
2 series · 2 of 2 positions shown · non-contrast
Comparison: 06/17/2007

CLINICAL DATA: Shortness of breath.  Chest pain.  Swelling in the
legs.  History of hypertension.

CHEST - 2 VIEW

[w chest lat]
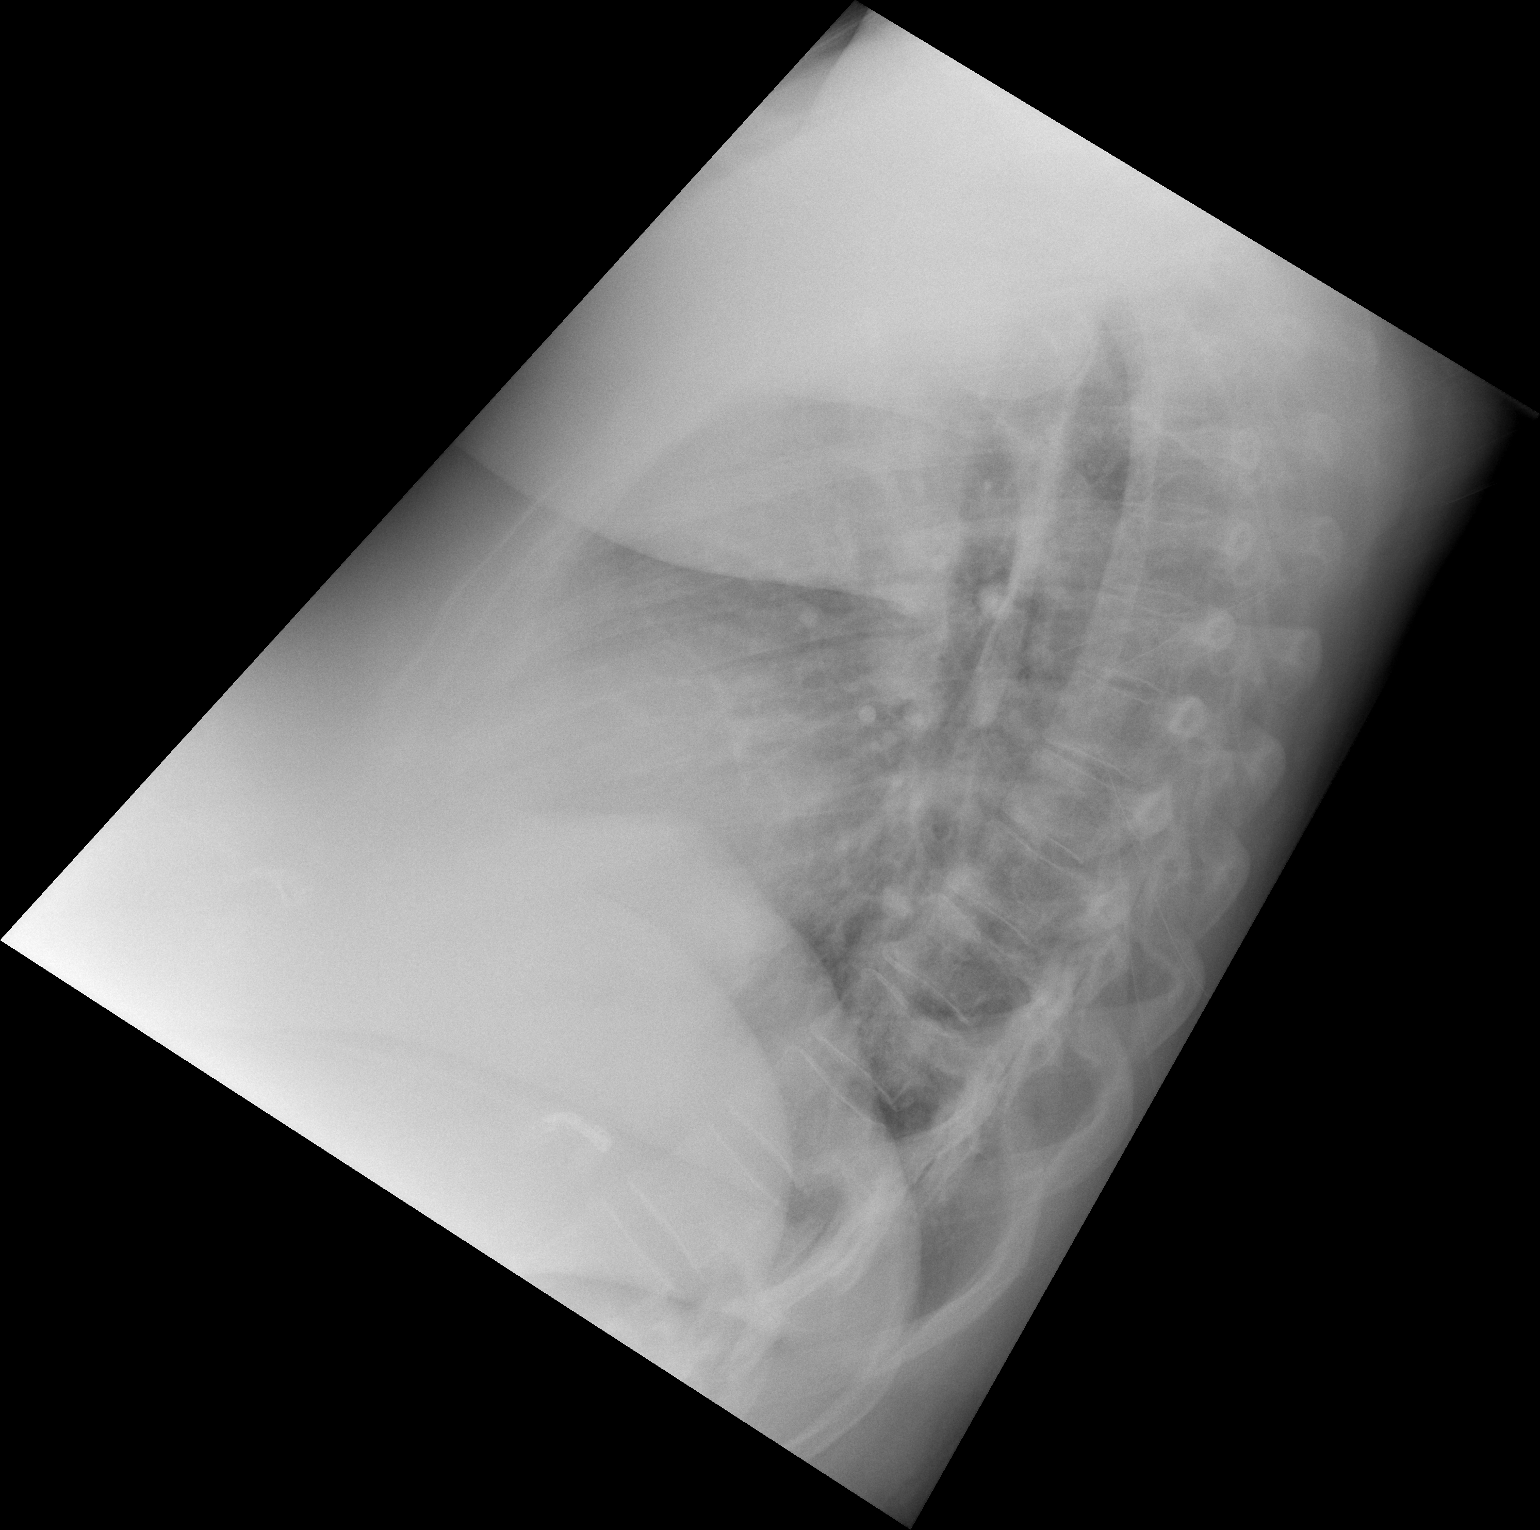

[w chest pa]
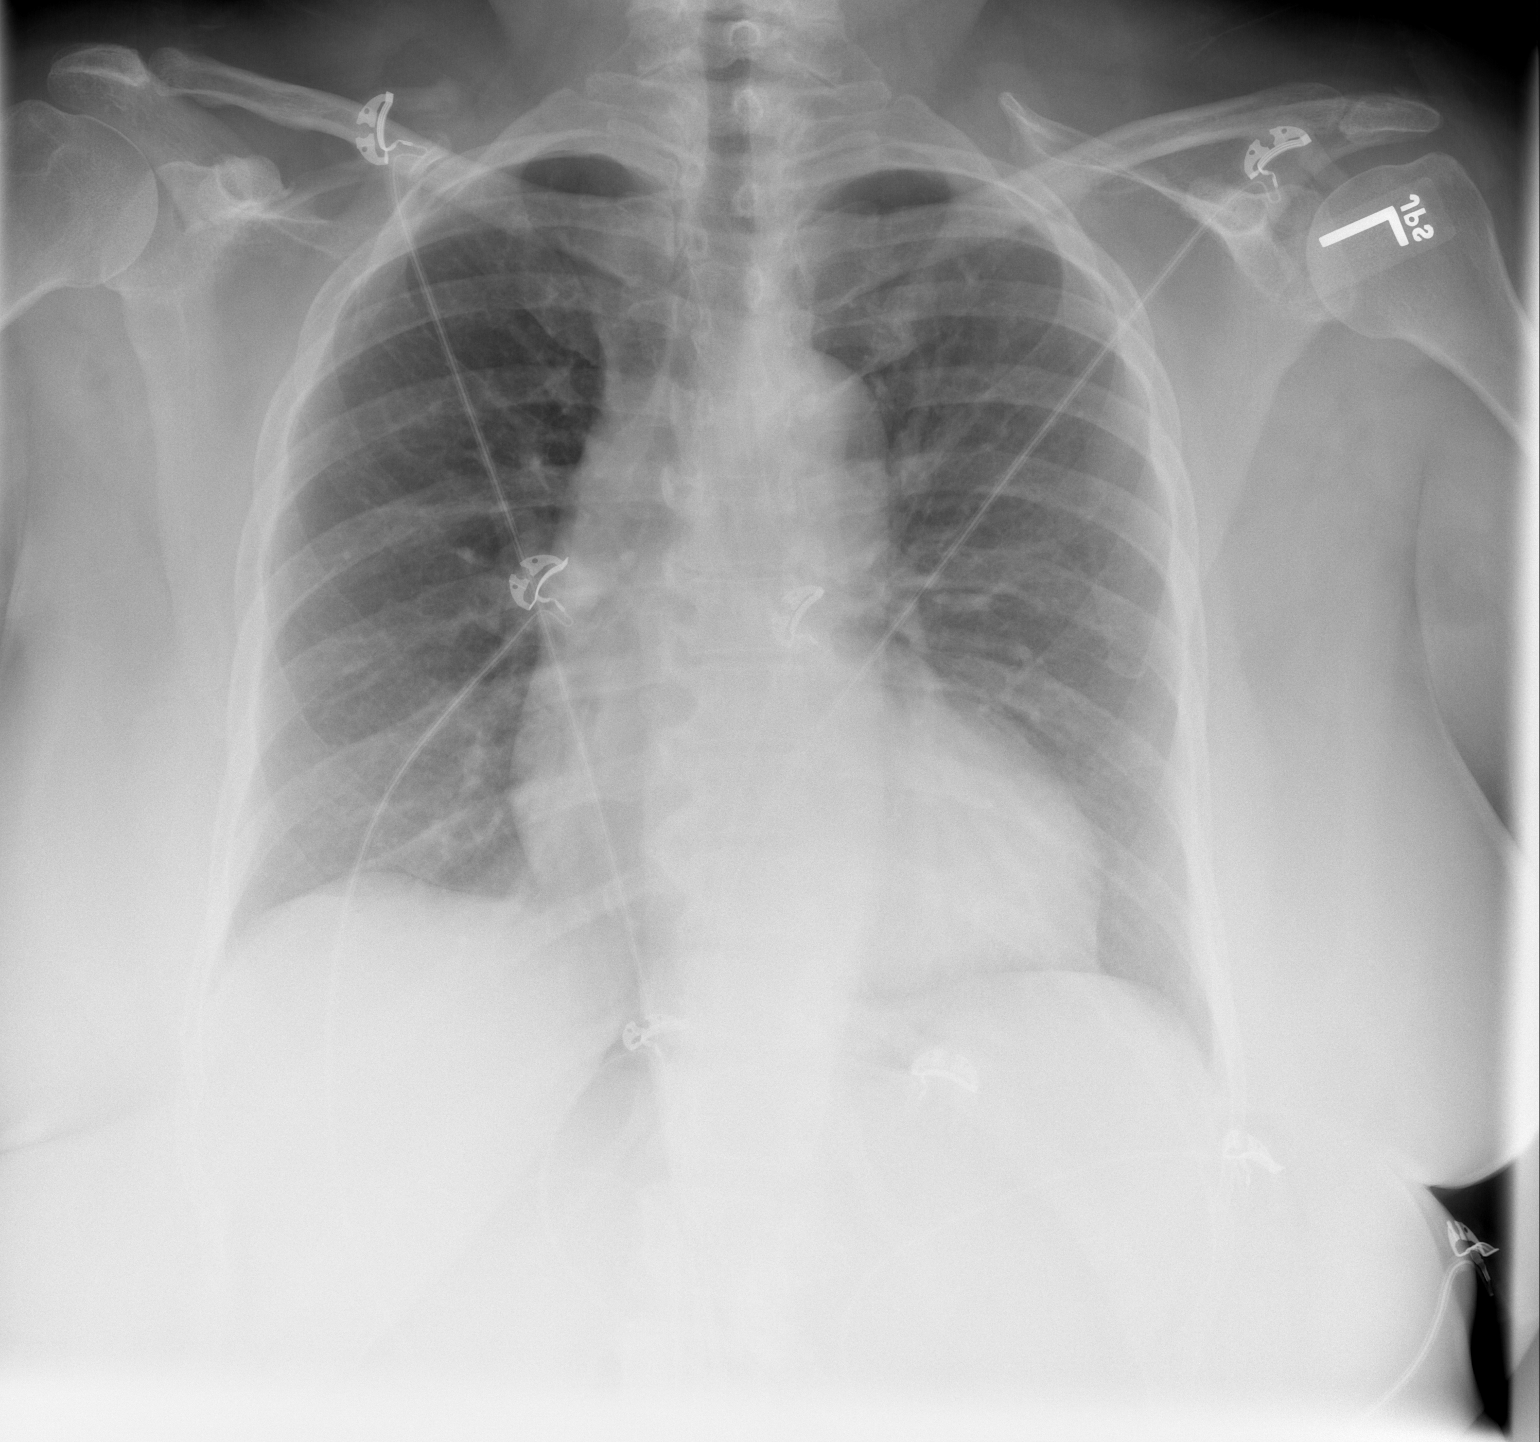

[2 of 2 positions shown; findings below may reference images not displayed]

FINDINGS: Stable cardiomegaly noted.  Mild upper zone vascular
prominence noted but no Kerley B lines or interstitial edema.

Mild thoracic spondylosis is present.  No pleural effusion.
IMPRESSION: 1.  Cardiomegaly with borderline pulmonary venous hypertension but
without overt edema.

## 2011-11-17 ENCOUNTER — Other Ambulatory Visit: Payer: Self-pay | Admitting: Internal Medicine

## 2012-01-15 ENCOUNTER — Encounter (HOSPITAL_COMMUNITY): Payer: Self-pay | Admitting: Physical Medicine and Rehabilitation

## 2012-01-15 ENCOUNTER — Other Ambulatory Visit: Payer: Self-pay

## 2012-01-15 ENCOUNTER — Emergency Department (HOSPITAL_COMMUNITY): Payer: Medicare Other

## 2012-01-15 ENCOUNTER — Inpatient Hospital Stay (HOSPITAL_COMMUNITY)
Admission: EM | Admit: 2012-01-15 | Discharge: 2012-01-17 | DRG: 866 | Disposition: A | Discharge status: Home / Self Care | Payer: Medicare Other | Attending: Cardiovascular Disease | Admitting: Cardiovascular Disease

## 2012-01-15 DIAGNOSIS — Z79899 Other long term (current) drug therapy: Secondary | ICD-10-CM

## 2012-01-15 DIAGNOSIS — Z794 Long term (current) use of insulin: Secondary | ICD-10-CM

## 2012-01-15 DIAGNOSIS — D72829 Elevated white blood cell count, unspecified: Secondary | ICD-10-CM | POA: Diagnosis present

## 2012-01-15 DIAGNOSIS — I2510 Atherosclerotic heart disease of native coronary artery without angina pectoris: Secondary | ICD-10-CM | POA: Diagnosis present

## 2012-01-15 DIAGNOSIS — H5440 Blindness, one eye, unspecified eye: Secondary | ICD-10-CM | POA: Diagnosis present

## 2012-01-15 DIAGNOSIS — E785 Hyperlipidemia, unspecified: Secondary | ICD-10-CM | POA: Diagnosis present

## 2012-01-15 DIAGNOSIS — R079 Chest pain, unspecified: Secondary | ICD-10-CM

## 2012-01-15 DIAGNOSIS — I10 Essential (primary) hypertension: Secondary | ICD-10-CM | POA: Diagnosis present

## 2012-01-15 DIAGNOSIS — D509 Iron deficiency anemia, unspecified: Secondary | ICD-10-CM | POA: Diagnosis present

## 2012-01-15 DIAGNOSIS — Z7982 Long term (current) use of aspirin: Secondary | ICD-10-CM

## 2012-01-15 DIAGNOSIS — B9789 Other viral agents as the cause of diseases classified elsewhere: Principal | ICD-10-CM | POA: Diagnosis present

## 2012-01-15 DIAGNOSIS — Z6841 Body Mass Index (BMI) 40.0 and over, adult: Secondary | ICD-10-CM

## 2012-01-15 DIAGNOSIS — E113599 Type 2 diabetes mellitus with proliferative diabetic retinopathy without macular edema, unspecified eye: Secondary | ICD-10-CM | POA: Diagnosis present

## 2012-01-15 DIAGNOSIS — E1139 Type 2 diabetes mellitus with other diabetic ophthalmic complication: Secondary | ICD-10-CM | POA: Diagnosis present

## 2012-01-15 DIAGNOSIS — K219 Gastro-esophageal reflux disease without esophagitis: Secondary | ICD-10-CM | POA: Diagnosis present

## 2012-01-15 DIAGNOSIS — E11359 Type 2 diabetes mellitus with proliferative diabetic retinopathy without macular edema: Secondary | ICD-10-CM | POA: Diagnosis present

## 2012-01-15 HISTORY — DX: Hyperlipidemia, unspecified: E78.5

## 2012-01-15 HISTORY — DX: Atherosclerotic heart disease of native coronary artery without angina pectoris: I25.10

## 2012-01-15 HISTORY — DX: Essential (primary) hypertension: I10

## 2012-01-15 LAB — CBC
Hemoglobin: 11.7 g/dL — ABNORMAL LOW (ref 12.0–15.0)
MCHC: 34.1 g/dL (ref 30.0–36.0)
WBC: 15.2 10*3/uL — ABNORMAL HIGH (ref 4.0–10.5)

## 2012-01-15 LAB — BASIC METABOLIC PANEL
Chloride: 97 mEq/L (ref 96–112)
GFR calc Af Amer: 80 mL/min — ABNORMAL LOW (ref >90)
GFR calc non Af Amer: 69 mL/min — ABNORMAL LOW (ref >90)
Glucose, Bld: 290 mg/dL — ABNORMAL HIGH (ref 70–99)
Potassium: 3.8 mEq/L (ref 3.5–5.1)
Sodium: 135 mEq/L (ref 135–145)

## 2012-01-15 LAB — CARDIAC PANEL(CRET KIN+CKTOT+MB+TROPI): Relative Index: 1.1 (ref 0.0–2.5)

## 2012-01-15 LAB — URINALYSIS, ROUTINE W REFLEX MICROSCOPIC
Hgb urine dipstick: NEGATIVE
Nitrite: NEGATIVE
Specific Gravity, Urine: 1.029 (ref 1.005–1.030)
Urobilinogen, UA: 1 mg/dL (ref 0.0–1.0)

## 2012-01-15 LAB — POCT I-STAT TROPONIN I: Troponin i, poc: 0 ng/mL (ref 0.00–0.08)

## 2012-01-15 LAB — MAGNESIUM: Magnesium: 1.9 mg/dL (ref 1.5–2.5)

## 2012-01-15 MED ORDER — NITROGLYCERIN 0.4 MG SL SUBL
0.4000 mg | SUBLINGUAL_TABLET | SUBLINGUAL | Status: AC | PRN
  Administered 2012-01-15 (×3): 0.4 mg via SUBLINGUAL
  Filled 2012-01-15: qty 25

## 2012-01-15 MED ORDER — RANOLAZINE ER 500 MG PO TB12
500.0000 mg | ORAL_TABLET | Freq: Two times a day (BID) | ORAL | Status: DC
  Administered 2012-01-15 – 2012-01-17 (×4): 500 mg via ORAL
  Filled 2012-01-15 (×5): qty 1

## 2012-01-15 MED ORDER — OXYCODONE HCL 5 MG PO TABS: 5.0000 mg | ORAL_TABLET | Freq: Four times a day (QID) | ORAL | Status: DC | PRN

## 2012-01-15 MED ORDER — PANTOPRAZOLE SODIUM 40 MG PO TBEC
40.0000 mg | DELAYED_RELEASE_TABLET | Freq: Two times a day (BID) | ORAL | Status: DC
  Administered 2012-01-16 – 2012-01-17 (×3): 40 mg via ORAL
  Filled 2012-01-15 (×3): qty 1

## 2012-01-15 MED ORDER — ACETAMINOPHEN 325 MG PO TABS
650.0000 mg | ORAL_TABLET | ORAL | Status: DC | PRN
  Administered 2012-01-16 (×2): 650 mg via ORAL
  Filled 2012-01-15 (×2): qty 2

## 2012-01-15 MED ORDER — ONDANSETRON HCL 4 MG/2ML IJ SOLN: 4.0000 mg | Freq: Once | INTRAMUSCULAR | Status: DC

## 2012-01-15 MED ORDER — EZETIMIBE-SIMVASTATIN 10-40 MG PO TABS
1.0000 | ORAL_TABLET | Freq: Every day | ORAL | Status: DC
  Administered 2012-01-15 – 2012-01-16 (×2): 1 via ORAL
  Filled 2012-01-15 (×3): qty 1

## 2012-01-15 MED ORDER — OXYCODONE HCL 5 MG PO CAPS: 5.0000 mg | ORAL_CAPSULE | Freq: Four times a day (QID) | ORAL | Status: DC | PRN

## 2012-01-15 MED ORDER — ZOLPIDEM TARTRATE 5 MG PO TABS: 10.0000 mg | ORAL_TABLET | Freq: Every evening | ORAL | Status: DC | PRN

## 2012-01-15 MED ORDER — ONDANSETRON HCL 4 MG/2ML IJ SOLN
4.0000 mg | Freq: Four times a day (QID) | INTRAMUSCULAR | Status: DC | PRN
  Administered 2012-01-15 – 2012-01-16 (×2): 4 mg via INTRAVENOUS
  Filled 2012-01-15 (×2): qty 2

## 2012-01-15 MED ORDER — NITROGLYCERIN 0.4 MG SL SUBL
0.4000 mg | SUBLINGUAL_TABLET | SUBLINGUAL | Status: DC | PRN
Start: 2012-01-15 — End: 2012-01-17

## 2012-01-15 MED ORDER — INSULIN ASPART 100 UNIT/ML ~~LOC~~ SOLN
0.0000 [IU] | Freq: Every day | SUBCUTANEOUS | Status: DC
  Administered 2012-01-15: 3 [IU] via SUBCUTANEOUS

## 2012-01-15 MED ORDER — LOSARTAN POTASSIUM-HCTZ 100-25 MG PO TABS: 1.0000 | ORAL_TABLET | Freq: Every day | ORAL | Status: DC

## 2012-01-15 MED ORDER — MORPHINE SULFATE 4 MG/ML IJ SOLN
4.0000 mg | Freq: Once | INTRAMUSCULAR | Status: AC
  Administered 2012-01-15: 4 mg via INTRAVENOUS
  Filled 2012-01-15: qty 1

## 2012-01-15 MED ORDER — LOSARTAN POTASSIUM 50 MG PO TABS
100.0000 mg | ORAL_TABLET | Freq: Every day | ORAL | Status: DC
  Administered 2012-01-16 – 2012-01-17 (×2): 100 mg via ORAL
  Filled 2012-01-15 (×3): qty 2

## 2012-01-15 MED ORDER — FUROSEMIDE 40 MG PO TABS
40.0000 mg | ORAL_TABLET | Freq: Every day | ORAL | Status: DC
  Administered 2012-01-16 – 2012-01-17 (×2): 40 mg via ORAL
  Filled 2012-01-15 (×3): qty 1

## 2012-01-15 MED ORDER — ONDANSETRON HCL 4 MG/2ML IJ SOLN
4.0000 mg | Freq: Four times a day (QID) | INTRAMUSCULAR | Status: DC | PRN
  Administered 2012-01-15: 4 mg via INTRAVENOUS
  Filled 2012-01-15: qty 2

## 2012-01-15 MED ORDER — ASPIRIN 81 MG PO CHEW
81.0000 mg | CHEWABLE_TABLET | Freq: Every day | ORAL | Status: DC
  Administered 2012-01-16 – 2012-01-17 (×2): 81 mg via ORAL
  Filled 2012-01-15 (×2): qty 1

## 2012-01-15 MED ORDER — HYDRALAZINE HCL 50 MG PO TABS
50.0000 mg | ORAL_TABLET | Freq: Two times a day (BID) | ORAL | Status: DC
  Administered 2012-01-15 – 2012-01-17 (×4): 50 mg via ORAL
  Filled 2012-01-15 (×5): qty 1

## 2012-01-15 MED ORDER — INSULIN ASPART PROT & ASPART (70-30 MIX) 100 UNIT/ML ~~LOC~~ SUSP
50.0000 [IU] | Freq: Two times a day (BID) | SUBCUTANEOUS | Status: DC
  Administered 2012-01-15 – 2012-01-17 (×4): 50 [IU] via SUBCUTANEOUS
  Filled 2012-01-15: qty 3

## 2012-01-15 MED ORDER — HEPARIN SODIUM (PORCINE) 5000 UNIT/ML IJ SOLN
5000.0000 [IU] | Freq: Three times a day (TID) | INTRAMUSCULAR | Status: DC
  Administered 2012-01-15 – 2012-01-17 (×5): 5000 [IU] via SUBCUTANEOUS
  Filled 2012-01-15 (×9): qty 1

## 2012-01-15 MED ORDER — ASPIRIN 81 MG PO TABS: 81.0000 mg | ORAL_TABLET | Freq: Every day | ORAL | Status: DC

## 2012-01-15 MED ORDER — SODIUM CHLORIDE 0.9 % IV SOLN
INTRAVENOUS | Status: DC
  Administered 2012-01-16: 500 mL via INTRAVENOUS

## 2012-01-15 MED ORDER — ISOSORBIDE MONONITRATE ER 60 MG PO TB24
60.0000 mg | ORAL_TABLET | Freq: Every day | ORAL | Status: DC
  Administered 2012-01-16 – 2012-01-17 (×2): 60 mg via ORAL
  Filled 2012-01-15 (×3): qty 1

## 2012-01-15 MED ORDER — HYDROCHLOROTHIAZIDE 25 MG PO TABS
25.0000 mg | ORAL_TABLET | Freq: Every day | ORAL | Status: DC
  Administered 2012-01-16 – 2012-01-17 (×2): 25 mg via ORAL
  Filled 2012-01-15 (×3): qty 1

## 2012-01-15 MED ORDER — ASPIRIN 81 MG PO CHEW
324.0000 mg | CHEWABLE_TABLET | Freq: Once | ORAL | Status: AC
  Administered 2012-01-15: 324 mg via ORAL
  Filled 2012-01-15: qty 3

## 2012-01-15 MED ORDER — POTASSIUM CHLORIDE CRYS ER 10 MEQ PO TBCR
10.0000 meq | EXTENDED_RELEASE_TABLET | Freq: Every day | ORAL | Status: DC
  Administered 2012-01-16 – 2012-01-17 (×2): 10 meq via ORAL
  Filled 2012-01-15 (×3): qty 1

## 2012-01-15 MED ORDER — NITROGLYCERIN 0.4 MG SL SUBL: 0.4000 mg | SUBLINGUAL_TABLET | SUBLINGUAL | Status: DC | PRN

## 2012-01-15 MED ORDER — ALPRAZOLAM 0.25 MG PO TABS: 0.2500 mg | ORAL_TABLET | Freq: Two times a day (BID) | ORAL | Status: DC | PRN

## 2012-01-15 MED ORDER — SODIUM CHLORIDE 0.9 % IV SOLN
Freq: Once | INTRAVENOUS | Status: AC
  Administered 2012-01-15: 10 mL/h via INTRAVENOUS

## 2012-01-15 MED ORDER — CARVEDILOL 3.125 MG PO TABS
3.1250 mg | ORAL_TABLET | Freq: Two times a day (BID) | ORAL | Status: DC
  Administered 2012-01-16 – 2012-01-17 (×3): 3.125 mg via ORAL
  Filled 2012-01-15 (×6): qty 1

## 2012-01-15 MED ORDER — INSULIN ASPART 100 UNIT/ML ~~LOC~~ SOLN
0.0000 [IU] | Freq: Three times a day (TID) | SUBCUTANEOUS | Status: DC
  Administered 2012-01-16 (×3): 2 [IU] via SUBCUTANEOUS
  Administered 2012-01-17: 5 [IU] via SUBCUTANEOUS
  Administered 2012-01-17: 2 [IU] via SUBCUTANEOUS

## 2012-01-15 MED ORDER — MORPHINE SULFATE 2 MG/ML IJ SOLN
2.0000 mg | INTRAMUSCULAR | Status: DC | PRN
  Administered 2012-01-15: 2 mg via INTRAVENOUS
  Filled 2012-01-15: qty 1

## 2012-01-15 NOTE — ED Notes (Signed)
IV team at bedside 

## 2012-01-15 NOTE — ED Notes (Signed)
Cardiology at bedside to assess pt.

## 2012-01-15 NOTE — H&P (Signed)
[name] is an 56 y.o. female.    Cardiologist: Dr. Rex Kras  Chief Complaint: chest pain, nausea, lack of appetite, headache, not feeling well HPI: 56 year old African American female with history of diffuse three-vessel coronary artery disease evaluated by TCTS and felt to be non-operable and it was not a candidate for PCI presents today with angina. We have not actually seen the patient for 2 years. In that timeframe the patient states she Has had chest pain but has been able to control it. Over the last 3-4 days now she's had increasing chest pain, headache, edema, nausea until this morning when it awakened her from sleep.  The nausea was significant she was hardly able to take crackers. Her chest pain was midsternal radiating down her left arm she still has some numbness in her left arm. It also was between her shoulder blades in the back per the patient. She complained of shortness of breath headache and a sharp pain in her chest as well. Since arrival to the emergency room she had nitroglycerin sublingual which is taken some of the edge of the chest pain but the left arm numbness continues.    Her EKG is without acute changes.    Her cardiac cath in July of 2010 revealed diffuse right coronary, circumflex and first diagonal disease. She had a TCTS consult with Dr. Caffie Burns and he did not feel she would be amenable to surgery due to poor targets. Since that time she has been managed medically.    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 Other history also included vaginal bleeding but do to her significant coronary disease she was not felt to be a surgical candidate for hysterectomy. No acute bleeding recently that she has had some spotting recently.  Past Medical History  Diagnosis Date    . Diabetes mellitus   . Hypertension   . Hyperlipemia   . Coronary artery disease   . Headache   . Chest pain at rest 01/15/2012    Past Surgical History  Procedure Date  . Cardiac catheterization     History reviewed. No pertinent family history. Mother with hypertension dyslipidemia and diabetes. Father with heart failure diabetes and dyslipidemia her siblings also have hypertension and diabetes. One brother with coronary artery disease now.  Social History:  reports that she has never smoked. She does not have any smokeless tobacco history on file. She reports that she does not drink alcohol or use illicit drugs. Married unemployed and considered legally blind no tobacco, alcohol or illicit drug use.  Allergies:  Allergies  Allergen Reactions  . Lorazepam     Outpatient medications: Per review of medications with the patient Hydralazine 50 mg one twice a day Potassium 10 mEq daily Imdur 60 mg daily  Aspirin 81 mg daily Insulin 75/25 65 Units in the morning 65 units in the evening subcutaneously Lasix 40 mg daily unless increased swelling and then she takes an extra 40 in the afternoon Vytorin 10/40 one daily Ranexa 500 mg one by mouth twice a day Protonic 40 mg daily Hyzaar 100-25 one daily  Oxycodone IR oh milligrams every 6 hours. For pain medroxyprogesterin prn for vaginal bleeding Nitroglycerin sublingual when necessary pain  Results for orders placed during the hospital encounter of 01/15/12 (from the past 48 hour(s))  CBC     Status:  Abnormal   Collection Time   01/15/12 10:34 AM      Component Value Range Comment   WBC 15.2 (*) 4.0 - 10.5 K/uL    RBC 4.21  3.87 - 5.11 MIL/uL    Hemoglobin 11.7 (*) 12.0 - 15.0 g/dL    HCT 34.3 (*) 36.0 - 46.0 %    MCV 81.5  78.0 - 100.0 fL    MCH 27.8  26.0 - 34.0 pg    MCHC 34.1  30.0 - 36.0 g/dL    RDW 15.3  11.5 - 15.5 %    Platelets 321  150 - 400 K/uL   BASIC METABOLIC PANEL     Status: Abnormal   Collection Time    01/15/12 10:34 AM      Component Value Range Comment   Sodium 135  135 - 145 mEq/L    Potassium 3.8  3.5 - 5.1 mEq/L    Chloride 97  96 - 112 mEq/L    CO2 25  19 - 32 mEq/L    Glucose, Bld 290 (*) 70 - 99 mg/dL    BUN 11  6 - 23 mg/dL    Creatinine, Ser 0.91  0.50 - 1.10 mg/dL    Calcium 9.4  8.4 - 10.5 mg/dL    GFR calc non Af Amer 69 (*) >90 mL/min    GFR calc Af Amer 80 (*) >90 mL/min   POCT I-STAT TROPONIN I     Status: Normal   Collection Time   01/15/12 11:04 AM      Component Value Range Comment   Troponin i, poc 0.00  0.00 - 0.08 ng/mL    Comment 3            URINALYSIS, ROUTINE W REFLEX MICROSCOPIC     Status: Abnormal   Collection Time   01/15/12  3:18 PM      Component Value Range Comment   Color, Urine YELLOW  YELLOW    APPearance CLOUDY (*) CLEAR    Specific Gravity, Urine 1.029  1.005 - 1.030    pH 6.0  5.0 - 8.0    Glucose, UA >1000 (*) NEGATIVE mg/dL    Hgb urine dipstick NEGATIVE  NEGATIVE    Bilirubin Urine NEGATIVE  NEGATIVE    Ketones, ur >80 (*) NEGATIVE mg/dL    Protein, ur NEGATIVE  NEGATIVE mg/dL    Urobilinogen, UA 1.0  0.0 - 1.0 mg/dL    Nitrite NEGATIVE  NEGATIVE    Leukocytes, UA NEGATIVE  NEGATIVE   URINE MICROSCOPIC-ADD ON     Status: Abnormal   Collection Time   01/15/12  3:18 PM      Component Value Range Comment   Squamous Epithelial / LPF MANY (*) RARE    Bacteria, UA MANY (*) RARE    Dg Chest 2 View  01/15/2012  *RADIOLOGY REPORT*  Clinical Data: Chest pain  CHEST - 2 VIEW  Comparison: 04/29/2010  Findings: Heart size is mildly enlarged.  No pleural effusion or edema.  No airspace consolidation identified.  Review of the visualized osseous structures is unremarkable.  IMPRESSION:  1.  No acute findings.  Original Report Authenticated By: Angelita Ingles, M.D.    ROS: General:Has had weight gain in the last 2 years, no pulse or fevers. Skin:No rashes or ulcers HEENT:She wears eye patch over the right due to blindness [id number] pain is  described [id number] of breath with her chest pain today [id number] is frequent for her but no  melena no diarrhea. She's also had a lot of nausea today but no vomiting GU:No hematuria or dysuria MS:No joint pain when she walks any distance she does have bilateral leg pain and lower back pain, Additionally her left fifth finger has joint pain and difficulty flexing. Neuro:No syncope Endo:Positive for diabetes previously has not been well-controlled, no thyroid disease   Blood pressure 164/81, pulse 105, temperature 98.5 F (36.9 C), temperature source Oral, resp. rate 14, SpO2 99.00%. PE: General:Alert oriented, African American female complains of chest discomfort headache but does have a pleasant affect Skin:Warm and dry brisk refill HEENT:Patch in place over the right eye, normocephalic Neck:Supple no JVD no carotid bruits 2+ carotid uptake Heart:S1-S2 regular rate and rhythm without murmur gallop rub or click Lungs:Clear without rales or wheezes [id number] soft nontender positive bowel sounds do not palpate liver spleen or masses [id number] 1-2+ of her feet no lower extremity edema currently otherwise left dorsalis pedal is 2+ right dorsalis pedis absent, 2+ posterior tibs bilaterally Neuro:Alert and oriented x3, follows commands moves all extremities pleasant affect.    Assessment/Plan Patient Active Problem List  Diagnosis  . DIABETES MELLITUS, TYPE II, ON INSULIN, UNCONTROLLED  . ANEMIA, IRON DEFICIENCY  . Proliferative diabetic retinopathy  . VISUAL CHANGES  . HYPERTENSION, BENIGN ESSENTIAL  . CAD  . GERD  . MENORRHAGIA, PERIMENOPAUSAL  . SHOULDER PAIN, LEFT  . DIZZINESS  . PERIPHERAL EDEMA  . DETACHED RETINA, BILATERAL, HX OF  . Chest pain at rest   PLAN:Multiple complaints for this patient with known coronary artery disease not amenable to PCI nor bypass surgery.  We'll add IV morphine for now for pain control, protonix For GI reflux, and Zofran for nausea keep  all full liquid diet tonight. Continue insulin but we will decrease the dose for now and check a hemoglobin A1c.  We'll also do cardiac enzymes but unless the troponin is positive we will not add IV heparin issues recently has had vaginal spotting with known need for hysterectomy but due to her heart issues has been unable to undergo surgery.  Continue her antianginal medications.  Sriyan Cutting R 01/15/2012, 4:28 PM

## 2012-01-15 NOTE — ED Provider Notes (Signed)
History     CSN: [id number]  Arrival date & time 01/15/12  0941   First MD Initiated Contact with Patient 01/15/12 [phone]      Chief Complaint  Patient presents with  . Chest Pain  . Shortness of Breath    (Consider location/radiation/quality/duration/timing/severity/associated sxs/prior treatment) The history is provided by the patient.   patient is a 56 year old female with a past medical history of diabetes, hypertension, hyperlipidemia, and known coronary artery disease not amenable to PCI approximately 3 years ago who presents to the emergency department with a chief complaint of gradually worsening intermittent midsternal and left-sided chest pain for the last 3 days. Pain described as pressure, anginal equivalent. Radiates to the left neck and left arm. Associated with shortness of breath, nausea, left arm tingling, diaphoresis. Endorses BLE swelling, chronic, for which she takes lasix occasionally, worse recently. There has also been intermittent reflux, though this is completely relieved with Pepto-Bismol and the patient feels is unrelated to her chest pain. Symptoms are worse with movement, relieved a small amount with 2 doses of nitroglycerin at home yesterday (none taken today). Pain is not pleuritic. No recent prolonged travel, no history of DVT or PE.  Past Medical History  Diagnosis Date  . Diabetes mellitus   . Hypertension   . Hyperlipemia     No past surgical history on file.  No family history on file.  History  Substance Use Topics  . Smoking status: Never Smoker   . Smokeless tobacco: Not on file  . Alcohol Use: No     Review of Systems 10 systems reviewed and are negative for acute change except as noted in the HPI.  Allergies  Review of patient's allergies indicates no known allergies.  Home Medications   Current Outpatient Rx  Name Route Sig Dispense Refill  . ASPIRIN 81 MG PO TABS Oral Take 81 mg by mouth daily.    Marland Kitchen EZETIMIBE-SIMVASTATIN 10-40  MG PO TABS Oral Take 1 tablet by mouth at bedtime.    . FUROSEMIDE 40 MG PO TABS Oral Take 40 mg by mouth daily.    Marland Kitchen HYDRALAZINE HCL 50 MG PO TABS Oral Take 50 mg by mouth 2 (two) times daily.    . INSULIN LISPRO PROT & LISPRO (75-25) 100 UNIT/ML [place] SUSP Subcutaneous Inject 60 Units into the skin 2 (two) times daily with a meal.    . ISOSORBIDE MONONITRATE ER 60 MG PO TB24 Oral Take 60 mg by mouth daily.    Marland Kitchen LOSARTAN POTASSIUM-HCTZ 100-25 MG PO TABS Oral Take 1 tablet by mouth daily.    Marland Kitchen MEDROXYPROGESTERONE ACETATE 10 MG PO TABS Oral Take 10 mg by mouth daily. For 10 days when menstrual cycle starts    . NITROGLYCERIN 0.4 MG SL SUBL Sublingual Place 0.4 mg under the tongue every 5 (five) minutes as needed. For chest pain    . OXYCODONE HCL 5 MG PO CAPS Oral Take 5 mg by mouth every 6 (six) hours as needed. For pain    . PANTOPRAZOLE SODIUM 40 MG PO TBEC Oral Take 40 mg by mouth daily.    Marland Kitchen POTASSIUM CHLORIDE CRYS ER 10 MEQ PO TBCR Oral Take 10 mEq by mouth daily.    Marland Kitchen RANOLAZINE ER 500 MG PO TB12 Oral Take 500 mg by mouth 2 (two) times daily.      BP 191/82  Pulse 99  Temp 98.8 F (37.1 C) (Oral)  Resp 18  SpO2 100%  Physical Exam  ED  Course  Procedures (including critical care time)  10:00 AM Pt seen and evaluated. Initial H&P performed. Uncomfortable-appearing. Will administer NTG to determine effect on pain, advance to morphine as needed. Will order basic labs, CXR. Initial EKG unchanged from prior.   1:00 PM Pt did require morphine for pain, with good result. Labs reviewed and demonstrate no elevation in troponin on initial draw. + Leukocytosis, mild anemia, hyperglycemia. CXR with no acute findings, reading below. Given known CAD hx with anginal-equivalent description, will consult cardiology for eval.  1:35 PM Spoke with Dr Stanford Breed with [facility]- will see in ED.  4:06 PM Cardiology has seen, plan to admit.  Labs Reviewed  CBC - Abnormal; Notable for the following:    WBC  15.2 (*)     Hemoglobin 11.7 (*)     HCT 34.3 (*)     All other components within normal limits  BASIC METABOLIC PANEL - Abnormal; Notable for the following:    Glucose, Bld 290 (*)     GFR calc non Af Amer 69 (*)     GFR calc Af Amer 80 (*)     All other components within normal limits  POCT I-STAT TROPONIN I  URINALYSIS, ROUTINE W REFLEX MICROSCOPIC   Dg Chest 2 View  01/15/2012  *RADIOLOGY REPORT*  Clinical Data: Chest pain  CHEST - 2 VIEW  Comparison: 04/29/2010  Findings: Heart size is mildly enlarged.  No pleural effusion or edema.  No airspace consolidation identified.  Review of the visualized osseous structures is unremarkable.  IMPRESSION:  1.  No acute findings.  Original Report Authenticated By: Angelita Ingles, M.D.    Date: 01/15/2012  Rate: 98  Rhythm: normal sinus rhythm  QRS Axis: normal  Intervals: normal  ST/T Wave abnormalities: nonspecific ST/T changes  Conduction Disutrbances:none  Narrative Interpretation: compared to prior tracing dated 04/29/2010, rate increased. T-wave abnormalities unchanged  Old EKG Reviewed: changes noted    Dx 1: Chest pain Dx 2: Leukocytosis   MDM          Orlie Dakin, PA-C 01/15/12 1606

## 2012-01-15 NOTE — ED Notes (Signed)
IV attempted x 2, IV team called for IV access

## 2012-01-15 NOTE — ED Notes (Signed)
Pt denies chest pain at this time, reports she is having leg pain.

## 2012-01-15 NOTE — ED Notes (Signed)
Pt presents to department for evaluation of midsternal chest pain radiating to L arm and SOB. Ongoing x3 days, states pain became worse this morning. States her L arm is numb and tingling at the time. 9/10 chest pressure upon arrival. Respirations unlabored. Skin warm and dry. She is conscious alert and oriented x4.

## 2012-01-15 NOTE — H&P (Signed)
Please also refer to the complete H/P by Dr. Cecilie Kicks, NP. Patient interviewed and examined by me. 56 year old with severe CAD primarily in RCA and LCX distribution, but with very poor distal targets (not amenable to either PCI or CABG), presents with 3 days of atypical chest pain. Some features suggest gastrointestinal etiology (alleviated by Peptobismol), others suggest musculoskeletal etiology (hurts worse when she laughs or coughs, but not with a pleuritic pattern). Nitroglycerin helps inconsistently. ECG shows chronic nonspecific ST changes, identical to older ECG. Cardiac enzymes negative so far. CXR unrevealing. Exam also significant for morbid obesity and absent right dorsalis pedis pulse, otherwise unremarkable. Admit for  Serial cardiac enzymes. No heparin. Nuclear perfusion study not likely to help.  Sanda Klein, MD, [place] [phone] office [phone] pager 01/15/2012 3:33 PM

## 2012-01-15 NOTE — ED Notes (Addendum)
Pt c/o mid-sternal chest pain 9/10 that started 3 days ago. Pt reports feeling SOB X 1 day. Pt reports having a cough for "a long time". Pt's husband reported she took a nitro yesterday and it helped her chest pain a little bit. Pt reports having indigestion along with chest pain earlier today, after taking peptobismal she reports her chest pain decreased. Pt reports feeling like she has some peripheral edema and states she usually takes her water pill when it gets bad.

## 2012-01-15 NOTE — ED Provider Notes (Signed)
Medical screening examination/treatment/procedure(s) were performed by non-physician practitioner and as supervising physician I was immediately available for consultation/collaboration.  Threasa Beards, MD 01/15/12 1620

## 2012-01-16 LAB — PROTIME-INR
INR: 1.44 (ref 0.00–1.49)
Prothrombin Time: 17.8 seconds — ABNORMAL HIGH (ref 11.6–15.2)

## 2012-01-16 LAB — BASIC METABOLIC PANEL
BUN: 9 mg/dL (ref 6–23)
Creatinine, Ser: 0.89 mg/dL (ref 0.50–1.10)
GFR calc Af Amer: 82 mL/min — ABNORMAL LOW (ref >90)
GFR calc non Af Amer: 71 mL/min — ABNORMAL LOW (ref >90)
Glucose, Bld: 214 mg/dL — ABNORMAL HIGH (ref 70–99)

## 2012-01-16 LAB — GLUCOSE, CAPILLARY
Glucose-Capillary: 199 mg/dL — ABNORMAL HIGH (ref 70–99)
Glucose-Capillary: 194 mg/dL — ABNORMAL HIGH (ref 70–99)
Glucose-Capillary: 180 mg/dL — ABNORMAL HIGH (ref 70–99)

## 2012-01-16 LAB — CARDIAC PANEL(CRET KIN+CKTOT+MB+TROPI)
Relative Index: 1.4 (ref 0.0–2.5)
Total CK: 181 U/L — ABNORMAL HIGH (ref 7–177)
Troponin I: <0.3 ng/mL (ref <0.30)
Troponin I: <0.3 ng/mL (ref <0.30)

## 2012-01-16 LAB — CBC
MCH: 27.8 pg (ref 26.0–34.0)
MCHC: 34.3 g/dL (ref 30.0–36.0)
RDW: 15.5 % (ref 11.5–15.5)

## 2012-01-16 LAB — LIPID PANEL
Cholesterol: 98 mg/dL (ref 0–200)
HDL: 37 mg/dL — ABNORMAL LOW (ref >39)
LDL Cholesterol: 40 mg/dL (ref 0–99)
Triglycerides: 104 mg/dL (ref <150)
VLDL: 21 mg/dL (ref 0–40)

## 2012-01-16 LAB — LIPASE, BLOOD: Lipase: 15 U/L (ref 11–59)

## 2012-01-16 LAB — HEPATIC FUNCTION PANEL
Bilirubin, Direct: <0.1 mg/dL (ref 0.0–0.3)
Total Protein: 7.8 g/dL (ref 6.0–8.3)

## 2012-01-16 NOTE — Plan of Care (Signed)
Problem: Phase I Progression Outcomes Goal: Hemodynamically stable Outcome: Progressing MD adjusting meds to decrease B/P

## 2012-01-16 NOTE — Progress Notes (Signed)
Pt having infrequent PVC's.  Strips posted in chart. Will continue to monitor. Jessie Foot

## 2012-01-16 NOTE — Progress Notes (Signed)
Pt. Reports she takes Metoprolol 200mg  daily, and cardizem 240mg  daily. She does not have scripts and has not been able to get them, supposedly the pharmacy was waiting for a call back from Dr. Eddie Dibbles office for renewals so she has not had these meds for the last 2 weeks.

## 2012-01-16 NOTE — Progress Notes (Signed)
[facility]  DAILY PROGRESS NOTE   Subjective:   No events overnight. Feels somewhat better today. Vomited yesterday. Has felt more fatigue for the last few days. WBC count was elevated to 15K, however, is trending down to 12K today.  Troponin is negative x 2.   Objective:  Temp:  [98.3 F (36.8 C)-98.8 F (37.1 C)] 98.3 F (36.8 C) (06/16 0600) Pulse Rate:  [91-108] 99  (06/16 0812) Resp:  [14-20] 14  (06/16 0600) BP: (134-206)/(74-99) 156/99 mmHg (06/16 0812) SpO2:  [96 %-100 %] 96 % (06/16 0600) Weight:  [125.556 kg (276 lb 12.8 oz)] 125.556 kg (276 lb 12.8 oz) (06/15 1730) Weight change:   Intake/Output from previous day:    Intake/Output from this shift:    Medications: Current Facility-Administered Medications  Medication Dose Route Frequency Provider Last Rate Last Dose  . 0.9 %  sodium chloride infusion   Intravenous Once Orlie Dakin, PA-C 10 mL/hr at 01/15/12 1252 10 mL/hr at 01/15/12 1252  . 0.9 %  sodium chloride infusion   Intravenous Continuous Cecilie Kicks, NP      . acetaminophen (TYLENOL) tablet 650 mg  650 mg Oral Q4H PRN Cecilie Kicks, NP   650 mg at 01/16/12 0133  . ALPRAZolam Duanne Moron) tablet 0.25 mg  0.25 mg Oral BID PRN Cecilie Kicks, NP      . aspirin chewable tablet 324 mg  324 mg Oral Once Orlie Dakin, PA-C   324 mg at 01/15/12 1138  . aspirin chewable tablet 81 mg  81 mg Oral Daily Mihai Croitoru, MD      . carvedilol (COREG) tablet 3.125 mg  3.125 mg Oral BID WC Cecilie Kicks, NP   3.125 mg at 01/16/12 [id number]  . ezetimibe-simvastatin (VYTORIN) 10-40 MG per tablet 1 tablet  1 tablet Oral QHS Cecilie Kicks, NP   1 tablet at 01/15/12 2136  . furosemide (LASIX) tablet 40 mg  40 mg Oral Daily Cecilie Kicks, NP      . heparin injection 5,000 Units  5,000 Units Subcutaneous Q8H Cecilie Kicks, NP   5,000 Units at 01/16/12 [id number]  . hydrALAZINE (APRESOLINE) tablet 50 mg  50 mg Oral BID Cecilie Kicks, NP   50 mg at  01/15/12 2136  . hydrochlorothiazide (HYDRODIURIL) tablet 25 mg  25 mg Oral Daily Mihai Croitoru, MD      . insulin aspart (novoLOG) injection 0-5 Units  0-5 Units Subcutaneous QHS Cecilie Kicks, NP   3 Units at 01/15/12 2148  . insulin aspart (novoLOG) injection 0-9 Units  0-9 Units Subcutaneous TID WC Cecilie Kicks, NP   2 Units at 01/16/12 0900  . insulin aspart protamine-insulin aspart (NOVOLOG 70/30) injection 50 Units  50 Units Subcutaneous BID WC Cecilie Kicks, NP   50 Units at 01/16/12 0900  . isosorbide mononitrate (IMDUR) 24 hr tablet 60 mg  60 mg Oral Daily Cecilie Kicks, NP      . losartan (COZAAR) tablet 100 mg  100 mg Oral Daily Mihai Croitoru, MD      . morphine 2 MG/ML injection 2 mg  2 mg Intravenous Q2H PRN Cecilie Kicks, NP   2 mg at 01/15/12 1636  . morphine 4 MG/ML injection 4 mg  4 mg Intravenous Once Orlie Dakin, PA-C   4 mg at 01/15/12 1252  . nitroGLYCERIN (NITROSTAT) SL tablet 0.4 mg  0.4 mg Sublingual Q5 min PRN Orlie Dakin, PA-C   0.4 mg at 01/15/12 1224  .  nitroGLYCERIN (NITROSTAT) SL tablet 0.4 mg  0.4 mg Sublingual Q5 min PRN Cecilie Kicks, NP      . ondansetron [facility]) injection 4 mg  4 mg Intravenous Q6H PRN Cecilie Kicks, NP   4 mg at 01/15/12 1902  . ondansetron (ZOFRAN) injection 4 mg  4 mg Intravenous Once Cecilie Kicks, NP      . oxyCODONE (Oxy IR/ROXICODONE) immediate release tablet 5 mg  5 mg Oral Q6H PRN Mihai Croitoru, MD      . pantoprazole (PROTONIX) EC tablet 40 mg  40 mg Oral BID AC Cecilie Kicks, NP   40 mg at 01/16/12 0649  . potassium chloride (K-DUR,KLOR-CON) CR tablet 10 mEq  10 mEq Oral Daily Cecilie Kicks, NP      . ranolazine (RANEXA) 12 hr tablet 500 mg  500 mg Oral BID Cecilie Kicks, NP   500 mg at 01/15/12 2136  . zolpidem (AMBIEN) tablet 10 mg  10 mg Oral QHS PRN Cecilie Kicks, NP      . DISCONTD: aspirin tablet 81 mg  81 mg Oral Daily Cecilie Kicks, NP      . DISCONTD: losartan-hydrochlorothiazide (HYZAAR) 100-25 MG per tablet  1 tablet  1 tablet Oral Daily Cecilie Kicks, NP      . DISCONTD: nitroGLYCERIN (NITROSTAT) SL tablet 0.4 mg  0.4 mg Sublingual Q5 Min x 3 PRN Cecilie Kicks, NP      . DISCONTD: ondansetron [facility]) injection 4 mg  4 mg Intravenous Q6H PRN Cecilie Kicks, NP   4 mg at 01/15/12 1636  . DISCONTD: oxycodone (OXY-IR) immediate release capsule 5 mg  5 mg Oral Q6H PRN Cecilie Kicks, NP        Physical Exam: General appearance: alert and no distress Neck: no adenopathy, no carotid bruit, no JVD, supple, symmetrical, trachea midline and thyroid not enlarged, symmetric, no tenderness/mass/nodules Lungs: clear to auscultation bilaterally Heart: regular rate and rhythm, S1, S2 normal, no murmur, click, rub or gallop Abdomen: morbidly obese, soft, non-tender Extremities: no edema  Lab Results: Results for orders placed during the hospital encounter of 01/15/12 (from the past 48 hour(s))  CBC     Status: Abnormal   Collection Time   01/15/12 10:34 AM      Component Value Range Comment   WBC 15.2 (*) 4.0 - 10.5 K/uL    RBC 4.21  3.87 - 5.11 MIL/uL    Hemoglobin 11.7 (*) 12.0 - 15.0 g/dL    HCT 34.3 (*) 36.0 - 46.0 %    MCV 81.5  78.0 - 100.0 fL    MCH 27.8  26.0 - 34.0 pg    MCHC 34.1  30.0 - 36.0 g/dL    RDW 15.3  11.5 - 15.5 %    Platelets 321  150 - 400 K/uL   BASIC METABOLIC PANEL     Status: Abnormal   Collection Time   01/15/12 10:34 AM      Component Value Range Comment   Sodium 135  135 - 145 mEq/L    Potassium 3.8  3.5 - 5.1 mEq/L    Chloride 97  96 - 112 mEq/L    CO2 25  19 - 32 mEq/L    Glucose, Bld 290 (*) 70 - 99 mg/dL    BUN 11  6 - 23 mg/dL    Creatinine, Ser 0.91  0.50 - 1.10 mg/dL    Calcium 9.4  8.4 - 10.5 mg/dL    GFR calc non Af Amer 69 (*) >90  mL/min    GFR calc Af Amer 80 (*) >90 mL/min   POCT I-STAT TROPONIN I     Status: Normal   Collection Time   01/15/12 11:04 AM      Component Value Range Comment   Troponin i, poc 0.00  0.00 - 0.08 ng/mL    Comment 3              URINALYSIS, ROUTINE W REFLEX MICROSCOPIC     Status: Abnormal   Collection Time   01/15/12  3:18 PM      Component Value Range Comment   Color, Urine YELLOW  YELLOW    APPearance CLOUDY (*) CLEAR    Specific Gravity, Urine 1.029  1.005 - 1.030    pH 6.0  5.0 - 8.0    Glucose, UA >1000 (*) NEGATIVE mg/dL    Hgb urine dipstick NEGATIVE  NEGATIVE    Bilirubin Urine NEGATIVE  NEGATIVE    Ketones, ur >80 (*) NEGATIVE mg/dL    Protein, ur NEGATIVE  NEGATIVE mg/dL    Urobilinogen, UA 1.0  0.0 - 1.0 mg/dL    Nitrite NEGATIVE  NEGATIVE    Leukocytes, UA NEGATIVE  NEGATIVE   URINE MICROSCOPIC-ADD ON     Status: Abnormal   Collection Time   01/15/12  3:18 PM      Component Value Range Comment   Squamous Epithelial / LPF MANY (*) RARE    Bacteria, UA MANY (*) RARE   CARDIAC PANEL(CRET KIN+CKTOT+MB+TROPI)     Status: Normal   Collection Time   01/15/12  3:46 PM      Component Value Range Comment   Total CK 110  7 - 177 U/L    CK, MB 1.2  0.3 - 4.0 ng/mL    Troponin I <0.30  <0.30 ng/mL    Relative Index 1.1  0.0 - 2.5   MAGNESIUM     Status: Normal   Collection Time   01/15/12  3:46 PM      Component Value Range Comment   Magnesium 1.9  1.5 - 2.5 mg/dL   TSH     Status: Normal   Collection Time   01/15/12  3:46 PM      Component Value Range Comment   TSH 0.658  0.350 - 4.500 uIU/mL   HEMOGLOBIN A1C     Status: Abnormal   Collection Time   01/15/12  3:46 PM      Component Value Range Comment   Hemoglobin A1C 8.6 (*) <5.7 %    Mean Plasma Glucose 200 (*) <117 mg/dL   GLUCOSE, CAPILLARY     Status: Abnormal   Collection Time   01/15/12  6:09 PM      Component Value Range Comment   Glucose-Capillary 287 (*) 70 - 99 mg/dL   GLUCOSE, CAPILLARY     Status: Abnormal   Collection Time   01/15/12  9:23 PM      Component Value Range Comment   Glucose-Capillary 286 (*) 70 - 99 mg/dL   CARDIAC PANEL(CRET KIN+CKTOT+MB+TROPI)     Status: Abnormal   Collection Time   01/16/12 12:29 AM       Component Value Range Comment   Total CK 181 (*) 7 - 177 U/L    CK, MB 2.5  0.3 - 4.0 ng/mL    Troponin I <0.30  <0.30 ng/mL    Relative Index 1.4  0.0 - 2.5   GLUCOSE, CAPILLARY     Status: Abnormal  Collection Time   01/16/12  7:37 AM      Component Value Range Comment   Glucose-Capillary 199 (*) 70 - 99 mg/dL   CBC     Status: Abnormal   Collection Time   01/16/12  8:20 AM      Component Value Range Comment   WBC 12.3 (*) 4.0 - 10.5 K/uL    RBC 4.03  3.87 - 5.11 MIL/uL    Hemoglobin 11.2 (*) 12.0 - 15.0 g/dL    HCT 32.7 (*) 36.0 - 46.0 %    MCV 81.1  78.0 - 100.0 fL    MCH 27.8  26.0 - 34.0 pg    MCHC 34.3  30.0 - 36.0 g/dL    RDW 15.5  11.5 - 15.5 %    Platelets 303  150 - 400 K/uL     Imaging: Dg Chest 2 View  01/15/2012  *RADIOLOGY REPORT*  Clinical Data: Chest pain  CHEST - 2 VIEW  Comparison: 04/29/2010  Findings: Heart size is mildly enlarged.  No pleural effusion or edema.  No airspace consolidation identified.  Review of the visualized osseous structures is unremarkable.  IMPRESSION:  1.  No acute findings.  Original Report Authenticated By: Angelita Ingles, M.D.    Assessment:  1. Principal Problem: 2.  *Chest pain at rest 3. Active Problems: 4.  DIABETES MELLITUS, TYPE II, ON INSULIN, UNCONTROLLED 5.  Proliferative diabetic retinopathy 6.  CAD 7. Leukocytosis - possible viral gastroenteritis  Plan:  1. Mrs. Mckinney has atypical CP symptoms on admission with nausea/vomiting yesterday. This has subsided today. WBC count was elevated yesterday but is trending down - differential was not performed. Urine is not a good sample, but she is spilling glucose - A1C still 8.6, suggesting suboptimal diabetes control. Ruled out for ACS.  I suspect this is likely a viral syndrome which is self-limiting.  If she is able to keep her food down today, I would recommend a bland diet for a few days and she could likely be safely discharged home today.  No further cardiac testing is  recommended.  Time Spent Directly with Patient:  15 minutes  Length of Stay:  LOS: 1 day   Pixie Casino, MD, [facility] Attending Cardiologist [facility] [place] [facility] 01/16/2012, 9:17 AM

## 2012-01-17 ENCOUNTER — Encounter (HOSPITAL_COMMUNITY): Payer: Self-pay | Admitting: Cardiology

## 2012-01-17 MED ORDER — CARVEDILOL 6.25 MG PO TABS: 6.2500 mg | ORAL_TABLET | Freq: Two times a day (BID) | ORAL | Status: DC

## 2012-01-17 MED ORDER — CARVEDILOL 6.25 MG PO TABS
6.2500 mg | ORAL_TABLET | Freq: Two times a day (BID) | ORAL | Status: DC
  Filled 2012-01-17 (×2): qty 1

## 2012-01-17 NOTE — Progress Notes (Signed)
Subjective: Now pt. States she had run out of 2 of her medications, the metoprolol and cardizem.  I have restarted on Coreg, will increase dose to 6.25 bid, and pulse in the 80's,? Need cardizem.  Objective: Vital signs in last 24 hours: Temp:  [98.2 F (36.8 C)-99.2 F (37.3 C)] 98.2 F (36.8 C) (06/17 0600) Pulse Rate:  [82-90] 82  (06/17 0600) Resp:  [14-18] 15  (06/17 0600) BP: (125-166)/(53-87) 125/53 mmHg (06/17 0600) SpO2:  [96 %-98 %] 96 % (06/17 0600) Weight:  [128.595 kg (283 lb 8 oz)] 128.595 kg (283 lb 8 oz) (06/17 0600) Weight change: 3.039 kg (6 lb 11.2 oz) Last BM Date: 01/14/12 Intake/Output from previous day: 06/16 0701 - 06/17 0700 In: 480 [P.O.:480] Out: -  Intake/Output this shift: Total I/O In: 400 [P.O.:400] Out: -   PE: General:  See Gaspar Bidding hager's note Heart: Lungs: Abd: Ext:    Lab Results:  Basename 01/16/12 0820 01/15/12 1034  WBC 12.3* 15.2*  HGB 11.2* 11.7*  HCT 32.7* 34.3*  PLT 303 321   BMET  Basename 01/16/12 0820 01/15/12 1034  NA 136 135  K 3.5 3.8  CL 99 97  CO2 24 25  GLUCOSE 214* 290*  BUN 9 11  CREATININE 0.89 0.91  CALCIUM 9.1 9.4    Basename 01/16/12 0820 01/16/12 0029  TROPONINI <0.30 <0.30    Lab Results  Component Value Date   CHOL 98 01/16/2012   HDL 37* 01/16/2012   LDLCALC 40 01/16/2012   TRIG 104 01/16/2012   CHOLHDL 2.6 01/16/2012   Lab Results  Component Value Date   HGBA1C 8.6* 01/15/2012     Lab Results  Component Value Date   TSH 0.658 01/15/2012    Hepatic Function Panel  Basename 01/16/12 1300  PROT 7.8  ALBUMIN 3.3*  AST 18  ALT 9  ALKPHOS 51  BILITOT 0.3  BILIDIR <0.1  IBILI NOT CALCULATED    Basename 01/16/12 0820  CHOL 98   No results found for this basename: PROTIME in the last 72 hours    EKG: Orders placed during the hospital encounter of 01/15/12  . ED EKG  . ED EKG  . EKG 12-LEAD  . EKG 12-LEAD  . EKG 12-LEAD    Studies/Results: Dg Chest 2 View  01/15/2012   *RADIOLOGY REPORT*  Clinical Data: Chest pain  CHEST - 2 VIEW  Comparison: 04/29/2010  Findings: Heart size is mildly enlarged.  No pleural effusion or edema.  No airspace consolidation identified.  Review of the visualized osseous structures is unremarkable.  IMPRESSION:  1.  No acute findings.  Original Report Authenticated By: Angelita Ingles, M.D.    Medications: I have reviewed the patient's current medications.    Marland Kitchen aspirin  81 mg Oral Daily  . carvedilol  3.125 mg Oral BID WC  . ezetimibe-simvastatin  1 tablet Oral QHS  . furosemide  40 mg Oral Daily  . heparin  5,000 Units Subcutaneous Q8H  . hydrALAZINE  50 mg Oral BID  . hydrochlorothiazide  25 mg Oral Daily  . insulin aspart  0-5 Units Subcutaneous QHS  . insulin aspart  0-9 Units Subcutaneous TID WC  . insulin aspart protamine-insulin aspart  50 Units Subcutaneous BID WC  . isosorbide mononitrate  60 mg Oral Daily  . losartan  100 mg Oral Daily  . ondansetron  4 mg Intravenous Once  . pantoprazole  40 mg Oral BID AC  . potassium chloride  10 mEq  Oral Daily  . ranolazine  500 mg Oral BID   Assessment/Plan: Patient Active Problem List  Diagnosis  . DIABETES MELLITUS, TYPE II, ON INSULIN, UNCONTROLLED  . ANEMIA, IRON DEFICIENCY  . Proliferative diabetic retinopathy  . VISUAL CHANGES  . HYPERTENSION, BENIGN ESSENTIAL  . CAD  . GERD  . MENORRHAGIA, PERIMENOPAUSAL  . SHOULDER PAIN, LEFT  . DIZZINESS  . PERIPHERAL EDEMA  . DETACHED RETINA, BILATERAL, HX OF  . Chest pain at rest  . Obesity, morbid   PLAN: negative MI, GI issues and chest pain resolved, all viral syndrome.  Pt. Had not been to the office in over 1 year and could not have refills because she did not make an appt.  Heart rate is stable, changed metoprolol that she had not had in 2 weeks to coreg, ? Add cardizem.  Discharge today.  LOS: 2 days   INGOLD,LAURA R 01/17/2012, 10:11 AM   I have seen and examined the patient along with [facility] R, NP.  I  have reviewed the chart, notes and new data.  I agree with NP's note.  Key new complaints: back to baseline Key examination changes: BP good on current regimen. Would leave on carvedilol and not restart diltiazem.  PLAN: DC home. Follow up office in a few weeks.   Sanda Klein, MD, [place] [phone] 01/17/2012, 10:40 AM

## 2012-01-17 NOTE — Progress Notes (Signed)
[facility]  Subjective: No CP or SOB.  She had a little nausea after eating but it resolved.  Objective: Vital signs in last 24 hours: Temp:  [98.2 F (36.8 C)-99.2 F (37.3 C)] 98.2 F (36.8 C) (06/17 0600) Pulse Rate:  [82-90] 82  (06/17 0600) Resp:  [14-18] 15  (06/17 0600) BP: (125-166)/(53-87) 125/53 mmHg (06/17 0600) SpO2:  [96 %-98 %] 96 % (06/17 0600) Weight:  [128.595 kg (283 lb 8 oz)] 128.595 kg (283 lb 8 oz) (06/17 0600) Last BM Date: 01/14/12  Intake/Output from previous day: 06/16 0701 - 06/17 0700 In: 480 [P.O.:480] Out: -  Intake/Output this shift: Total I/O In: 400 [P.O.:400] Out: -   Medications Current Facility-Administered Medications  Medication Dose Route Frequency Provider Last Rate Last Dose  . acetaminophen (TYLENOL) tablet 650 mg  650 mg Oral Q4H PRN Cecilie Kicks, NP   650 mg at 01/16/12 1811  . ALPRAZolam Duanne Moron) tablet 0.25 mg  0.25 mg Oral BID PRN Cecilie Kicks, NP      . aspirin chewable tablet 81 mg  81 mg Oral Daily Bethanee Redondo, MD   81 mg at 01/17/12 0941  . carvedilol (COREG) tablet 3.125 mg  3.125 mg Oral BID WC Cecilie Kicks, NP   3.125 mg at 01/17/12 0850  . ezetimibe-simvastatin (VYTORIN) 10-40 MG per tablet 1 tablet  1 tablet Oral QHS Cecilie Kicks, NP   1 tablet at 01/16/12 2234  . furosemide (LASIX) tablet 40 mg  40 mg Oral Daily Cecilie Kicks, NP   40 mg at 01/17/12 0938  . heparin injection 5,000 Units  5,000 Units Subcutaneous Q8H Cecilie Kicks, NP   5,000 Units at 01/17/12 [id number]  . hydrALAZINE (APRESOLINE) tablet 50 mg  50 mg Oral BID Cecilie Kicks, NP   50 mg at 01/17/12 0937  . hydrochlorothiazide (HYDRODIURIL) tablet 25 mg  25 mg Oral Daily Chae Shuster, MD   25 mg at 01/17/12 0937  . insulin aspart (novoLOG) injection 0-5 Units  0-5 Units Subcutaneous QHS Cecilie Kicks, NP   3 Units at 01/15/12 2148  . insulin aspart (novoLOG) injection 0-9 Units  0-9 Units Subcutaneous TID WC Cecilie Kicks, NP   2  Units at 01/17/12 0835  . insulin aspart protamine-insulin aspart (NOVOLOG 70/30) injection 50 Units  50 Units Subcutaneous BID WC Cecilie Kicks, NP   50 Units at 01/17/12 0830  . isosorbide mononitrate (IMDUR) 24 hr tablet 60 mg  60 mg Oral Daily Cecilie Kicks, NP   60 mg at 01/17/12 0937  . losartan (COZAAR) tablet 100 mg  100 mg Oral Daily Sharin Altidor, MD   100 mg at 01/17/12 0937  . morphine 2 MG/ML injection 2 mg  2 mg Intravenous Q2H PRN Cecilie Kicks, NP   2 mg at 01/15/12 1636  . nitroGLYCERIN (NITROSTAT) SL tablet 0.4 mg  0.4 mg Sublingual Q5 min PRN Cecilie Kicks, NP      . ondansetron [facility]) injection 4 mg  4 mg Intravenous Q6H PRN Cecilie Kicks, NP   4 mg at 01/16/12 2002  . ondansetron (ZOFRAN) injection 4 mg  4 mg Intravenous Once Cecilie Kicks, NP      . oxyCODONE (Oxy IR/ROXICODONE) immediate release tablet 5 mg  5 mg Oral Q6H PRN Emmanuell Kantz, MD      . pantoprazole (PROTONIX) EC tablet 40 mg  40 mg Oral BID AC Cecilie Kicks, NP   40 mg at 01/17/12 0622  . potassium chloride (K-DUR,KLOR-CON)  CR tablet 10 mEq  10 mEq Oral Daily Cecilie Kicks, NP   10 mEq at 01/17/12 [id number]  . ranolazine (RANEXA) 12 hr tablet 500 mg  500 mg Oral BID Cecilie Kicks, NP   500 mg at 01/17/12 [id number]  . zolpidem (AMBIEN) tablet 10 mg  10 mg Oral QHS PRN Cecilie Kicks, NP      . DISCONTD: 0.9 %  sodium chloride infusion   Intravenous Continuous Cecilie Kicks, NP 20 mL/hr at 01/16/12 1305 500 mL at 01/16/12 1305    PE: General appearance: alert, cooperative and no distress Lungs: clear to auscultation bilaterally Heart: regular rate and rhythm, S1, S2 normal, no murmur, click, rub or gallop Extremities: 1+ LEE Pulses: 1+ Radials.  2+ DPs. Neurologic: Grossly normal Skin: warm and dry.  Lab Results:   Basename 01/16/12 0820 01/15/12 1034  WBC 12.3* 15.2*  HGB 11.2* 11.7*  HCT 32.7* 34.3*  PLT 303 321   BMET  Basename 01/16/12 0820 01/15/12 1034  NA 136 135  K 3.5 3.8  CL 99 97  CO2 24 25    GLUCOSE 214* 290*  BUN 9 11  CREATININE 0.89 0.91  CALCIUM 9.1 9.4   PT/INR  Basename 01/16/12 0820  LABPROT 17.8*  INR 1.44   Cholesterol  Basename 01/16/12 0820  CHOL 98    Assessment/Plan  Principal Problem:  *Chest pain at rest Active Problems:  DIABETES MELLITUS, TYPE II, ON INSULIN, UNCONTROLLED  Proliferative diabetic retinopathy  CAD Obesity, morbid  Plan:  Continues PC free.  Ruled out ACS.  DC home today.  BP stable.  Needs follow-up with PCP regarding DM.   LOS: 2 days    HAGER, BRYAN 01/17/2012 9:59 AM  Agree. DC home. See also other note from today.  Sanda Klein, MD, [place] [phone] office [phone] pager 01/17/2012 10:42 AM

## 2012-01-19 NOTE — Discharge Summary (Signed)
Physician Discharge Summary  Patient ID: [name] MRN: [id number] DOB/AGE: 1956-06-01 56 y.o.  Admit date: 01/15/2012 Discharge date: 01/19/2012  Admission Diagnoses:  Discharge Diagnoses:  Principal Problem:  *Chest pain at rest, secondary to viral syndrome.  negative MI Active Problems:  DIABETES MELLITUS, TYPE II, ON INSULIN, UNCONTROLLED  Proliferative diabetic retinopathy  CAD  Obesity, morbid  Nausea and vomiting, secondary to viral syndrome   Discharged Condition: stable  Hospital Course:   56 year old African American female with history of diffuse three-vessel coronary artery disease evaluated by TCTS and felt to be non-operable and she was not a candidate for PCI.  She presented with angina. We have not actually seen the patient for 2 years. In that timeframe the patient stated she has had chest pain but has been able to control it. Over the last 3-4 days now she's had increasing chest pain, headache, edema, nausea until the  Morning of admission when it awakened her from sleep. The nausea was significant. She was hardly able to take crackers. Her chest pain was midsternal radiating down her left arm she had numbness in her left arm. It also was between her shoulder blades in the back. She complained of shortness of breath headache and a sharp pain in her chest as well. Nitroglycerin in the emergency room took some of the edge off the chest pain but the left arm numbness continued.  Her EKG was without acute changes.   Her cardiac cath in July of 2010 revealed diffuse right coronary, circumflex and first diagonal disease. She had a TCTS consult with Dr. Caffie Pinto and he did not feel she would be amenable to surgery due to poor targets. Since that time she has been managed medically.  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   She was admitted and started on IV morphine, protonix, and PRN zofran.  Liquid diet.  A1C was 8.6.  Cardiac markers were cycled and negative x three.   She admits to running out of two meds.  CP and nausea improved.  See discharge meds below.  She was seen by Dr. Sallyanne Kuster and felt to be stable for DC home.  Consults: None  Significant Diagnostic Studies:  CHEST - 2 VIEW  Comparison: 04/29/2010  Findings: Heart size is mildly enlarged. No pleural effusion or  edema.  No airspace consolidation identified.  Review of the visualized osseous structures is unremarkable.  IMPRESSION:  1. No acute findings.   CBC    Component Value Date/Time   WBC 12.3* 01/16/2012 0820   RBC 4.03 01/16/2012 0820   HGB 11.2* 01/16/2012 0820   HCT 32.7* 01/16/2012 0820   PLT 303 01/16/2012 0820   MCV 81.1 01/16/2012 0820   MCH 27.8 01/16/2012 0820   MCHC 34.3 01/16/2012 0820   RDW 15.5 01/16/2012 0820   LYMPHSABS 1.5 04/29/2010 1455   MONOABS 0.5 04/29/2010 1455   EOSABS 0.0 04/29/2010 1455   BASOSABS 0.0 04/29/2010 1455   Lipid Panel     Component Value Date/Time   CHOL 98 01/16/2012 0820   TRIG 104 01/16/2012 0820   HDL 37* 01/16/2012 0820   CHOLHDL 2.6 01/16/2012 0820   VLDL 21 01/16/2012 0820   LDLCALC 40 01/16/2012 0820   CMP     Component  Value Date/Time   NA 136 01/16/2012 0820   K 3.5 01/16/2012 0820   CL 99 01/16/2012 0820   CO2 24 01/16/2012 0820   GLUCOSE 214* 01/16/2012 0820   BUN 9 01/16/2012 0820   CREATININE 0.89 01/16/2012 0820   CALCIUM 9.1 01/16/2012 0820   PROT 7.8 01/16/2012 1300   ALBUMIN 3.3* 01/16/2012 1300   AST 18 01/16/2012 1300   ALT 9 01/16/2012 1300   ALKPHOS 51 01/16/2012 1300   BILITOT 0.3 01/16/2012 1300   GFRNONAA 71* 01/16/2012 0820   GFRAA 82* 01/16/2012 0820     Treatments:   Discharge Exam: Blood pressure 125/53, pulse 82, temperature 98.2 F  (36.8 C), temperature source Oral, resp. rate 15, height 5\' 3"  (1.6 m), weight 128.595 kg (283 lb 8 oz), SpO2 96.00%.   Disposition: 01-Home or Self Care  Discharge Orders    Future Orders Please Complete By Expires   Diet - low sodium heart healthy      Increase activity slowly        Medication List  As of 01/19/2012  3:37 PM   TAKE these medications         aspirin 81 MG tablet   Take 81 mg by mouth daily.      carvedilol 6.25 MG tablet   Commonly known as: COREG   Take 1 tablet (6.25 mg total) by mouth 2 (two) times daily with a meal.      ezetimibe-simvastatin 10-40 MG per tablet   Commonly known as: VYTORIN   Take 1 tablet by mouth at bedtime.      furosemide 40 MG tablet   Commonly known as: LASIX   Take 40 mg by mouth daily.      hydrALAZINE 50 MG tablet   Commonly known as: APRESOLINE   Take 50 mg by mouth 2 (two) times daily.      insulin lispro protamine-insulin lispro (75-25) 100 UNIT/ML Susp   Commonly known as: HUMALOG 75/25   Inject 60 Units into the skin 2 (two) times daily with a meal.      isosorbide mononitrate 60 MG 24 hr tablet   Commonly known as: IMDUR   Take 60 mg by mouth daily.      losartan-hydrochlorothiazide 100-25 MG per tablet   Commonly known as: HYZAAR   Take 1 tablet by mouth daily.      medroxyPROGESTERone 10 MG tablet   Commonly known as: PROVERA   Take 10 mg by mouth daily. For 10 days when menstrual cycle starts      nitroGLYCERIN 0.4 MG SL tablet   Commonly known as: NITROSTAT   Place 0.4 mg under the tongue every 5 (five) minutes as needed. For chest pain      oxycodone 5 MG capsule   Commonly known as: OXY-IR   Take 5 mg by mouth every 6 (six) hours as needed. For pain      pantoprazole 40 MG tablet   Commonly known as: PROTONIX   Take 40 mg by mouth daily.      potassium chloride 10 MEQ tablet   Commonly known as: K-DUR,KLOR-CON   Take 10 mEq by mouth daily.      ranolazine 500 MG 12 hr tablet   Commonly known  as: RANEXA   Take 500 mg by mouth 2 (two) times daily.             SignedTarri Fuller 01/19/2012, 3:37 PM

## 2012-04-02 DIAGNOSIS — I5032 Chronic diastolic (congestive) heart failure: Secondary | ICD-10-CM | POA: Diagnosis present

## 2012-04-02 HISTORY — DX: Chronic diastolic (congestive) heart failure: I50.32

## 2012-04-03 ENCOUNTER — Emergency Department (HOSPITAL_COMMUNITY): Payer: Medicare Other

## 2012-04-03 ENCOUNTER — Emergency Department (HOSPITAL_COMMUNITY): Payer: Medicare Other | Admitting: Anesthesiology

## 2012-04-03 ENCOUNTER — Inpatient Hospital Stay (HOSPITAL_COMMUNITY)
Admission: EM | Admit: 2012-04-03 | Discharge: 2012-04-13 | DRG: 871 | Disposition: A | Discharge status: Skilled Nursing Facility | Payer: Medicare Other | Attending: General Surgery | Admitting: General Surgery

## 2012-04-03 ENCOUNTER — Encounter (HOSPITAL_COMMUNITY): Payer: Self-pay | Admitting: Anesthesiology

## 2012-04-03 ENCOUNTER — Encounter (HOSPITAL_COMMUNITY): Payer: Self-pay | Admitting: Emergency Medicine

## 2012-04-03 ENCOUNTER — Encounter (HOSPITAL_COMMUNITY): Admission: EM | Disposition: A | Discharge status: Skilled Nursing Facility | Payer: Self-pay | Source: Home / Self Care

## 2012-04-03 DIAGNOSIS — E1129 Type 2 diabetes mellitus with other diabetic kidney complication: Secondary | ICD-10-CM | POA: Diagnosis present

## 2012-04-03 DIAGNOSIS — I5032 Chronic diastolic (congestive) heart failure: Secondary | ICD-10-CM | POA: Diagnosis present

## 2012-04-03 DIAGNOSIS — E669 Obesity, unspecified: Secondary | ICD-10-CM

## 2012-04-03 DIAGNOSIS — I248 Other forms of acute ischemic heart disease: Secondary | ICD-10-CM | POA: Diagnosis not present

## 2012-04-03 DIAGNOSIS — L0231 Cutaneous abscess of buttock: Secondary | ICD-10-CM | POA: Diagnosis present

## 2012-04-03 DIAGNOSIS — E113599 Type 2 diabetes mellitus with proliferative diabetic retinopathy without macular edema, unspecified eye: Secondary | ICD-10-CM | POA: Diagnosis present

## 2012-04-03 DIAGNOSIS — Z6841 Body Mass Index (BMI) 40.0 and over, adult: Secondary | ICD-10-CM

## 2012-04-03 DIAGNOSIS — I2510 Atherosclerotic heart disease of native coronary artery without angina pectoris: Secondary | ICD-10-CM | POA: Diagnosis present

## 2012-04-03 DIAGNOSIS — K219 Gastro-esophageal reflux disease without esophagitis: Secondary | ICD-10-CM | POA: Diagnosis present

## 2012-04-03 DIAGNOSIS — L03319 Cellulitis of trunk, unspecified: Secondary | ICD-10-CM

## 2012-04-03 DIAGNOSIS — A480 Gas gangrene: Secondary | ICD-10-CM | POA: Diagnosis present

## 2012-04-03 DIAGNOSIS — I214 Non-ST elevation (NSTEMI) myocardial infarction: Secondary | ICD-10-CM

## 2012-04-03 DIAGNOSIS — R112 Nausea with vomiting, unspecified: Secondary | ICD-10-CM

## 2012-04-03 DIAGNOSIS — K611 Rectal abscess: Secondary | ICD-10-CM | POA: Diagnosis present

## 2012-04-03 DIAGNOSIS — E876 Hypokalemia: Secondary | ICD-10-CM | POA: Diagnosis present

## 2012-04-03 DIAGNOSIS — L03317 Cellulitis of buttock: Secondary | ICD-10-CM

## 2012-04-03 DIAGNOSIS — E785 Hyperlipidemia, unspecified: Secondary | ICD-10-CM | POA: Diagnosis present

## 2012-04-03 DIAGNOSIS — I252 Old myocardial infarction: Secondary | ICD-10-CM

## 2012-04-03 DIAGNOSIS — I10 Essential (primary) hypertension: Secondary | ICD-10-CM | POA: Diagnosis present

## 2012-04-03 DIAGNOSIS — E1139 Type 2 diabetes mellitus with other diabetic ophthalmic complication: Secondary | ICD-10-CM | POA: Diagnosis present

## 2012-04-03 DIAGNOSIS — E1165 Type 2 diabetes mellitus with hyperglycemia: Secondary | ICD-10-CM

## 2012-04-03 DIAGNOSIS — R42 Dizziness and giddiness: Secondary | ICD-10-CM

## 2012-04-03 DIAGNOSIS — A419 Sepsis, unspecified organism: Principal | ICD-10-CM | POA: Diagnosis present

## 2012-04-03 DIAGNOSIS — IMO0002 Reserved for concepts with insufficient information to code with codable children: Secondary | ICD-10-CM | POA: Diagnosis present

## 2012-04-03 DIAGNOSIS — E11319 Type 2 diabetes mellitus with unspecified diabetic retinopathy without macular edema: Secondary | ICD-10-CM | POA: Diagnosis present

## 2012-04-03 DIAGNOSIS — E119 Type 2 diabetes mellitus without complications: Secondary | ICD-10-CM

## 2012-04-03 DIAGNOSIS — D509 Iron deficiency anemia, unspecified: Secondary | ICD-10-CM | POA: Diagnosis present

## 2012-04-03 DIAGNOSIS — N760 Acute vaginitis: Secondary | ICD-10-CM

## 2012-04-03 DIAGNOSIS — I2489 Other forms of acute ischemic heart disease: Secondary | ICD-10-CM | POA: Diagnosis not present

## 2012-04-03 DIAGNOSIS — L02219 Cutaneous abscess of trunk, unspecified: Secondary | ICD-10-CM

## 2012-04-03 DIAGNOSIS — K612 Anorectal abscess: Secondary | ICD-10-CM

## 2012-04-03 DIAGNOSIS — Z794 Long term (current) use of insulin: Secondary | ICD-10-CM

## 2012-04-03 DIAGNOSIS — G4733 Obstructive sleep apnea (adult) (pediatric): Secondary | ICD-10-CM | POA: Diagnosis present

## 2012-04-03 HISTORY — PX: INCISION AND DRAINAGE PERIRECTAL ABSCESS: SHX1804

## 2012-04-03 LAB — POCT I-STAT 7, (LYTES, BLD GAS, ICA,H+H)
Acid-base deficit: 3 mmol/L — ABNORMAL HIGH (ref 0.0–2.0)
Bicarbonate: 21.9 meq/L (ref 20.0–24.0)
Calcium, Ion: 1.1 mmol/L — ABNORMAL LOW (ref 1.12–1.23)
HCT: 22 % — ABNORMAL LOW (ref 36.0–46.0)
Hemoglobin: 7.5 g/dL — ABNORMAL LOW (ref 12.0–15.0)
O2 Saturation: 100 %
Patient temperature: 37
Potassium: 3.6 meq/L (ref 3.5–5.1)
Sodium: 134 meq/L — ABNORMAL LOW (ref 135–145)
TCO2: 23 mmol/L (ref 0–100)
pCO2 arterial: 39.2 mmHg (ref 35.0–45.0)
pH, Arterial: 7.355 (ref 7.350–7.450)
pO2, Arterial: 297 mmHg — ABNORMAL HIGH (ref 80.0–100.0)

## 2012-04-03 LAB — GLUCOSE, CAPILLARY
Glucose-Capillary: 295 mg/dL — ABNORMAL HIGH (ref 70–99)
Glucose-Capillary: 231 mg/dL — ABNORMAL HIGH (ref 70–99)
Glucose-Capillary: 146 mg/dL — ABNORMAL HIGH (ref 70–99)

## 2012-04-03 LAB — CBC
HCT: 24.2 % — ABNORMAL LOW (ref 36.0–46.0)
Hemoglobin: 8 g/dL — ABNORMAL LOW (ref 12.0–15.0)
MCHC: 33.1 g/dL (ref 30.0–36.0)
MCV: 76.3 fL — ABNORMAL LOW (ref 78.0–100.0)
RDW: 14.8 % (ref 11.5–15.5)

## 2012-04-03 LAB — WET PREP, GENITAL

## 2012-04-03 LAB — POCT I-STAT 3, VENOUS BLOOD GAS (G3P V)
Bicarbonate: 22.7 mEq/L (ref 20.0–24.0)
O2 Saturation: 70 %
TCO2: 24 mmol/L (ref 0–100)
pCO2, Ven: 32.9 mmHg — ABNORMAL LOW (ref 45.0–50.0)
pH, Ven: 7.446 — ABNORMAL HIGH (ref 7.250–7.300)

## 2012-04-03 LAB — BASIC METABOLIC PANEL
BUN: 15 mg/dL (ref 6–23)
CO2: 22 mEq/L (ref 19–32)
Calcium: 9.1 mg/dL (ref 8.4–10.5)
Creatinine, Ser: 1.26 mg/dL — ABNORMAL HIGH (ref 0.50–1.10)
GFR calc non Af Amer: 46 mL/min — ABNORMAL LOW (ref >90)
Glucose, Bld: 361 mg/dL — ABNORMAL HIGH (ref 70–99)

## 2012-04-03 LAB — URINALYSIS, ROUTINE W REFLEX MICROSCOPIC
Bilirubin Urine: NEGATIVE
Glucose, UA: >1000 mg/dL — AB
Hgb urine dipstick: NEGATIVE
Protein, ur: 30 mg/dL — AB
Urobilinogen, UA: 1 mg/dL (ref 0.0–1.0)

## 2012-04-03 LAB — URINE MICROSCOPIC-ADD ON

## 2012-04-03 LAB — APTT: aPTT: 35 seconds (ref 24–37)

## 2012-04-03 LAB — TROPONIN I: Troponin I: 0.45 ng/mL (ref <0.30)

## 2012-04-03 LAB — PHOSPHORUS: Phosphorus: 1.9 mg/dL — ABNORMAL LOW (ref 2.3–4.6)

## 2012-04-03 SURGERY — INCISION AND DRAINAGE, ABSCESS, PERIRECTAL
Anesthesia: General | Site: Buttocks | Laterality: Left | Wound class: Dirty or Infected

## 2012-04-03 MED ORDER — ONDANSETRON HCL 4 MG/2ML IJ SOLN
4.0000 mg | Freq: Once | INTRAMUSCULAR | Status: AC
  Administered 2012-04-03: 4 mg via INTRAVENOUS
  Filled 2012-04-03: qty 2

## 2012-04-03 MED ORDER — ONDANSETRON HCL 4 MG/2ML IJ SOLN: 4.0000 mg | Freq: Once | INTRAMUSCULAR | Status: DC | PRN

## 2012-04-03 MED ORDER — SODIUM CHLORIDE 0.9 % IV SOLN
INTRAVENOUS | Status: DC | PRN
  Administered 2012-04-03: 14:00:00 via INTRAVENOUS

## 2012-04-03 MED ORDER — ROCURONIUM BROMIDE 100 MG/10ML IV SOLN
INTRAVENOUS | Status: DC | PRN
  Administered 2012-04-03: 30 mg via INTRAVENOUS

## 2012-04-03 MED ORDER — NEOSTIGMINE METHYLSULFATE 1 MG/ML IJ SOLN
INTRAMUSCULAR | Status: DC | PRN
  Administered 2012-04-03: 3 mg via INTRAVENOUS

## 2012-04-03 MED ORDER — SUCCINYLCHOLINE CHLORIDE 20 MG/ML IJ SOLN
INTRAMUSCULAR | Status: DC | PRN
  Administered 2012-04-03: 100 mg via INTRAVENOUS

## 2012-04-03 MED ORDER — POTASSIUM CHLORIDE IN NACL 20-0.9 MEQ/L-% IV SOLN
INTRAVENOUS | Status: DC
  Filled 2012-04-03 (×2): qty 1000

## 2012-04-03 MED ORDER — DIPHENHYDRAMINE HCL 12.5 MG/5ML PO ELIX
12.5000 mg | ORAL_SOLUTION | Freq: Four times a day (QID) | ORAL | Status: DC | PRN
  Filled 2012-04-03: qty 5

## 2012-04-03 MED ORDER — SODIUM CHLORIDE 0.9 % IV BOLUS (SEPSIS)
1000.0000 mL | Freq: Once | INTRAVENOUS | Status: AC
  Administered 2012-04-03: 1000 mL via INTRAVENOUS

## 2012-04-03 MED ORDER — 0.9 % SODIUM CHLORIDE (POUR BTL) OPTIME
TOPICAL | Status: DC | PRN
  Administered 2012-04-03: 1000 mL

## 2012-04-03 MED ORDER — HYDROCHLOROTHIAZIDE 25 MG PO TABS
25.0000 mg | ORAL_TABLET | Freq: Every day | ORAL | Status: DC
  Administered 2012-04-03 – 2012-04-05 (×3): 25 mg via ORAL
  Filled 2012-04-03 (×3): qty 1

## 2012-04-03 MED ORDER — HYDROMORPHONE HCL PF 1 MG/ML IJ SOLN: 0.2500 mg | INTRAMUSCULAR | Status: DC | PRN

## 2012-04-03 MED ORDER — ONDANSETRON HCL 4 MG/2ML IJ SOLN: 4.0000 mg | Freq: Four times a day (QID) | INTRAMUSCULAR | Status: DC | PRN

## 2012-04-03 MED ORDER — PROPOFOL 10 MG/ML IV EMUL
INTRAVENOUS | Status: DC | PRN
  Administered 2012-04-03: 130 mg via INTRAVENOUS

## 2012-04-03 MED ORDER — FENTANYL CITRATE 0.05 MG/ML IJ SOLN
INTRAMUSCULAR | Status: DC | PRN
  Administered 2012-04-03 (×4): 50 ug via INTRAVENOUS

## 2012-04-03 MED ORDER — ONDANSETRON HCL 4 MG/2ML IJ SOLN
INTRAMUSCULAR | Status: DC | PRN
  Administered 2012-04-03: 4 mg via INTRAVENOUS

## 2012-04-03 MED ORDER — POLYVINYL ALCOHOL 1.4 % OP SOLN
1.0000 [drp] | Freq: Every day | OPHTHALMIC | Status: DC | PRN
  Administered 2012-04-05: 1 [drp] via OPHTHALMIC
  Filled 2012-04-03: qty 15

## 2012-04-03 MED ORDER — LOSARTAN POTASSIUM 50 MG PO TABS
100.0000 mg | ORAL_TABLET | Freq: Every day | ORAL | Status: DC
  Administered 2012-04-03 – 2012-04-05 (×3): 100 mg via ORAL
  Filled 2012-04-03 (×3): qty 2

## 2012-04-03 MED ORDER — METOPROLOL SUCCINATE ER 100 MG PO TB24
200.0000 mg | ORAL_TABLET | Freq: Every day | ORAL | Status: DC
  Administered 2012-04-03 – 2012-04-05 (×3): 200 mg via ORAL
  Filled 2012-04-03 (×3): qty 2

## 2012-04-03 MED ORDER — BUPIVACAINE-EPINEPHRINE PF 0.25-1:200000 % IJ SOLN
INTRAMUSCULAR | Status: AC
  Filled 2012-04-03: qty 30

## 2012-04-03 MED ORDER — INSULIN ASPART 100 UNIT/ML ~~LOC~~ SOLN: 0.0000 [IU] | SUBCUTANEOUS | Status: DC

## 2012-04-03 MED ORDER — SODIUM CHLORIDE 0.9 % IV SOLN
INTRAVENOUS | Status: DC
  Filled 2012-04-03: qty 1

## 2012-04-03 MED ORDER — HEPARIN SODIUM (PORCINE) 5000 UNIT/ML IJ SOLN
5000.0000 [IU] | Freq: Three times a day (TID) | INTRAMUSCULAR | Status: DC
  Administered 2012-04-03 – 2012-04-13 (×30): 5000 [IU] via SUBCUTANEOUS
  Filled 2012-04-03 (×33): qty 1

## 2012-04-03 MED ORDER — PANTOPRAZOLE SODIUM 40 MG IV SOLR
40.0000 mg | INTRAVENOUS | Status: DC
  Administered 2012-04-03 – 2012-04-04 (×2): 40 mg via INTRAVENOUS
  Filled 2012-04-03 (×3): qty 40

## 2012-04-03 MED ORDER — ACETAMINOPHEN 10 MG/ML IV SOLN: 1000.0000 mg | Freq: Once | INTRAVENOUS | Status: DC | PRN

## 2012-04-03 MED ORDER — CLINDAMYCIN PHOSPHATE 300 MG/50ML IV SOLN
300.0000 mg | Freq: Three times a day (TID) | INTRAVENOUS | Status: DC
  Administered 2012-04-03 – 2012-04-05 (×7): 300 mg via INTRAVENOUS
  Filled 2012-04-03 (×8): qty 50

## 2012-04-03 MED ORDER — INSULIN ASPART 100 UNIT/ML ~~LOC~~ SOLN
5.0000 [IU] | Freq: Once | SUBCUTANEOUS | Status: AC
  Administered 2012-04-03: 5 [IU] via INTRAVENOUS

## 2012-04-03 MED ORDER — PIPERACILLIN-TAZOBACTAM 3.375 G IVPB: 3.3750 g | Freq: Once | INTRAVENOUS | Status: DC

## 2012-04-03 MED ORDER — ATROPINE SULFATE 1 % OP SOLN
1.0000 [drp] | Freq: Two times a day (BID) | OPHTHALMIC | Status: DC
  Administered 2012-04-03 – 2012-04-05 (×4): 1 [drp] via OPHTHALMIC
  Filled 2012-04-03 (×2): qty 2

## 2012-04-03 MED ORDER — LACTATED RINGERS IV SOLN
INTRAVENOUS | Status: DC
  Administered 2012-04-03: 10:00:00 via INTRAVENOUS

## 2012-04-03 MED ORDER — BUPIVACAINE-EPINEPHRINE PF 0.25-1:200000 % IJ SOLN
INTRAMUSCULAR | Status: DC | PRN
  Administered 2012-04-03: 30 mL

## 2012-04-03 MED ORDER — SODIUM CHLORIDE 0.9 % IV SOLN
INTRAVENOUS | Status: AC
  Administered 2012-04-03: 5 [IU]/h via INTRAVENOUS
  Filled 2012-04-03: qty 1

## 2012-04-03 MED ORDER — DILTIAZEM HCL ER 240 MG PO CP24
240.0000 mg | ORAL_CAPSULE | Freq: Every day | ORAL | Status: DC
  Administered 2012-04-03 – 2012-04-05 (×3): 240 mg via ORAL
  Filled 2012-04-03 (×3): qty 1

## 2012-04-03 MED ORDER — MORPHINE SULFATE (PF) 1 MG/ML IV SOLN
INTRAVENOUS | Status: DC
  Administered 2012-04-03: 3 mg via INTRAVENOUS
  Administered 2012-04-03 – 2012-04-04 (×2): via INTRAVENOUS
  Administered 2012-04-04: 6 mg via INTRAVENOUS
  Administered 2012-04-04 (×2): 4.5 mg via INTRAVENOUS
  Administered 2012-04-04: 3 mg via INTRAVENOUS
  Administered 2012-04-05: 2.55 mg via INTRAVENOUS
  Administered 2012-04-05: 7.5 mg via INTRAVENOUS
  Administered 2012-04-05: 4.5 mg via INTRAVENOUS
  Administered 2012-04-05 (×2): via INTRAVENOUS
  Administered 2012-04-05: 9.61 mg via INTRAVENOUS
  Administered 2012-04-05: 7.5 mg via INTRAVENOUS
  Administered 2012-04-05: 3 mg via INTRAVENOUS
  Administered 2012-04-06: 1 mg via INTRAVENOUS
  Administered 2012-04-06: 4.5 mg via INTRAVENOUS
  Administered 2012-04-06: 14.5 mg via INTRAVENOUS
  Administered 2012-04-06: 0.801 mg via INTRAVENOUS
  Administered 2012-04-06: 3 mg via INTRAVENOUS
  Administered 2012-04-06: 05:00:00 via INTRAVENOUS
  Administered 2012-04-07: 3 mg via INTRAVENOUS
  Administered 2012-04-07: 1 mg via INTRAVENOUS
  Administered 2012-04-08: 2 mg via INTRAVENOUS
  Administered 2012-04-08: 5 mg via INTRAVENOUS
  Administered 2012-04-08 – 2012-04-09 (×2): 4 mg via INTRAVENOUS
  Administered 2012-04-09: 6 mg via INTRAVENOUS
  Filled 2012-04-03 (×7): qty 25

## 2012-04-03 MED ORDER — NITROGLYCERIN 0.4 MG SL SUBL: 0.4000 mg | SUBLINGUAL_TABLET | SUBLINGUAL | Status: DC | PRN

## 2012-04-03 MED ORDER — SODIUM CHLORIDE 0.9 % IJ SOLN: 9.0000 mL | INTRAMUSCULAR | Status: DC | PRN

## 2012-04-03 MED ORDER — LOSARTAN POTASSIUM-HCTZ 100-25 MG PO TABS: 1.0000 | ORAL_TABLET | Freq: Every day | ORAL | Status: DC

## 2012-04-03 MED ORDER — METOPROLOL TARTRATE 1 MG/ML IV SOLN
INTRAVENOUS | Status: DC | PRN
  Administered 2012-04-03 (×5): 1 mg via INTRAVENOUS

## 2012-04-03 MED ORDER — DEXTROSE 10 % IV SOLN: INTRAVENOUS | Status: DC | PRN

## 2012-04-03 MED ORDER — SODIUM CHLORIDE 0.9 % IV SOLN
INTRAVENOUS | Status: DC
  Administered 2012-04-03 – 2012-04-04 (×2): via INTRAVENOUS
  Administered 2012-04-04: 150 mL/h via INTRAVENOUS
  Administered 2012-04-04: 150 mL via INTRAVENOUS
  Administered 2012-04-05 (×2): via INTRAVENOUS

## 2012-04-03 MED ORDER — ONDANSETRON HCL 4 MG PO TABS
4.0000 mg | ORAL_TABLET | Freq: Four times a day (QID) | ORAL | Status: DC | PRN
  Administered 2012-04-11: 4 mg via ORAL
  Filled 2012-04-03: qty 1

## 2012-04-03 MED ORDER — VANCOMYCIN HCL 1000 MG IV SOLR
2000.0000 mg | INTRAVENOUS | Status: AC
  Administered 2012-04-03: 2000 mg via INTRAVENOUS
  Filled 2012-04-03: qty 2000

## 2012-04-03 MED ORDER — GLYCOPYRROLATE 0.2 MG/ML IJ SOLN
INTRAMUSCULAR | Status: DC | PRN
  Administered 2012-04-03: 0.4 mg via INTRAVENOUS

## 2012-04-03 MED ORDER — LIDOCAINE HCL (CARDIAC) 20 MG/ML IV SOLN
INTRAVENOUS | Status: DC | PRN
  Administered 2012-04-03: 100 mg via INTRAVENOUS

## 2012-04-03 MED ORDER — CLINDAMYCIN PHOSPHATE 600 MG/50ML IV SOLN: 600.0000 mg | Freq: Once | INTRAVENOUS | Status: DC

## 2012-04-03 MED ORDER — HYDROMORPHONE HCL PF 1 MG/ML IJ SOLN
1.0000 mg | INTRAMUSCULAR | Status: AC
  Administered 2012-04-03: 1 mg via INTRAVENOUS
  Filled 2012-04-03: qty 1

## 2012-04-03 MED ORDER — PIPERACILLIN-TAZOBACTAM 3.375 G IVPB
3.3750 g | Freq: Three times a day (TID) | INTRAVENOUS | Status: DC
  Administered 2012-04-03 – 2012-04-05 (×6): 3.375 g via INTRAVENOUS
  Filled 2012-04-03 (×9): qty 50

## 2012-04-03 MED ORDER — HYDRALAZINE HCL 50 MG PO TABS
50.0000 mg | ORAL_TABLET | Freq: Two times a day (BID) | ORAL | Status: DC
  Administered 2012-04-03 – 2012-04-05 (×4): 50 mg via ORAL
  Filled 2012-04-03 (×5): qty 1

## 2012-04-03 MED ORDER — DIPHENHYDRAMINE HCL 50 MG/ML IJ SOLN: 12.5000 mg | Freq: Four times a day (QID) | INTRAMUSCULAR | Status: DC | PRN

## 2012-04-03 MED ORDER — NALOXONE HCL 0.4 MG/ML IJ SOLN: 0.4000 mg | INTRAMUSCULAR | Status: DC | PRN

## 2012-04-03 MED ORDER — ONDANSETRON HCL 4 MG/2ML IJ SOLN
4.0000 mg | Freq: Four times a day (QID) | INTRAMUSCULAR | Status: DC | PRN
  Administered 2012-04-05 – 2012-04-09 (×3): 4 mg via INTRAVENOUS
  Filled 2012-04-03 (×3): qty 2

## 2012-04-03 SURGICAL SUPPLY — 30 items
BLADE SURG 15 STRL LF DISP TIS (BLADE) ×1 IMPLANT
BLADE SURG 15 STRL SS (BLADE) ×1
CANISTER SUCTION 2500CC (MISCELLANEOUS) ×2 IMPLANT
CLEANER TIP ELECTROSURG 2X2 (MISCELLANEOUS) IMPLANT
CLOTH BEACON ORANGE TIMEOUT ST (SAFETY) ×2 IMPLANT
COVER SURGICAL LIGHT HANDLE (MISCELLANEOUS) ×2 IMPLANT
DRSG PAD ABDOMINAL 8X10 ST (GAUZE/BANDAGES/DRESSINGS) ×2 IMPLANT
ELECT REM PT RETURN 9FT ADLT (ELECTROSURGICAL) ×2
ELECTRODE REM PT RTRN 9FT ADLT (ELECTROSURGICAL) ×1 IMPLANT
GAUZE PACKING IODOFORM 1 (PACKING) IMPLANT
GAUZE SPONGE 4X4 16PLY XRAY LF (GAUZE/BANDAGES/DRESSINGS) IMPLANT
GLOVE BIO SURGEON STRL SZ7 (GLOVE) ×2 IMPLANT
GLOVE SURG SIGNA 7.5 PF LTX (GLOVE) ×2 IMPLANT
GOWN PREVENTION PLUS XLARGE (GOWN DISPOSABLE) ×2 IMPLANT
GOWN STRL NON-REIN LRG LVL3 (GOWN DISPOSABLE) ×2 IMPLANT
KIT BASIN OR (CUSTOM PROCEDURE TRAY) ×2 IMPLANT
KIT ROOM TURNOVER OR (KITS) ×2 IMPLANT
NS IRRIG 1000ML POUR BTL (IV SOLUTION) ×2 IMPLANT
PACK LITHOTOMY IV (CUSTOM PROCEDURE TRAY) ×2 IMPLANT
PAD ARMBOARD 7.5X6 YLW CONV (MISCELLANEOUS) ×2 IMPLANT
PENCIL BUTTON HOLSTER BLD 10FT (ELECTRODE) ×2 IMPLANT
SPONGE GAUZE 4X4 12PLY (GAUZE/BANDAGES/DRESSINGS) ×2 IMPLANT
SWAB COLLECTION DEVICE MRSA (MISCELLANEOUS) ×2 IMPLANT
TOWEL OR 17X24 6PK STRL BLUE (TOWEL DISPOSABLE) ×2 IMPLANT
TOWEL OR 17X26 10 PK STRL BLUE (TOWEL DISPOSABLE) ×2 IMPLANT
TUBE ANAEROBIC SPECIMEN COL (MISCELLANEOUS) ×2 IMPLANT
TUBE CONNECTING 12X1/4 (SUCTIONS) ×2 IMPLANT
UNDERPAD 30X30 INCONTINENT (UNDERPADS AND DIAPERS) ×2 IMPLANT
WATER STERILE IRR 1000ML POUR (IV SOLUTION) IMPLANT
YANKAUER SUCT BULB TIP NO VENT (SUCTIONS) ×2 IMPLANT

## 2012-04-03 NOTE — Consult Note (Signed)
Name: [name] MRN: [id number] DOB: 15-Jun-1956    LOS: 0  Referring Provider:  [facility] Dr Ninfa Linden Reason for Referral:  Resp Distress  PULMONARY / CRITICAL CARE MEDICINE  HPI:   56 year old obese, DM, CAD, BP, Hyperlipidemia and  ? Undiagnosed OSA, female with 2 day history of worsening perianal discomfort and drainage presented to ER. 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 Etiology unclear - patient noted to have long nails on exam. Patient has significant comorbidities. Got extubated post op and in SICU mentating well with normal hemodynamics. CCM asked for consult 9/2 for post operative respiratory and medical issues    Past Medical History  Diagnosis Date  . Diabetes mellitus   . Hypertension   . Hyperlipemia   . Coronary artery disease   . Headache   . Chest pain at rest 01/15/2012  . Nausea and vomiting, secondary to viral syndrome 01/17/2012   Past Surgical History  Procedure Date  . Cardiac catheterization    Prior to Admission medications   Medication Sig Start Date End Date Taking? Authorizing Provider  acetaminophen (TYLENOL) 500 MG tablet Take 500 mg by mouth every 6 (six) hours as needed. For pain   Yes Historical Provider, MD  aspirin 81 MG tablet Take 81 mg by mouth daily.   Yes Historical Provider, MD  atropine 1 % ophthalmic solution Place 1 drop into the right eye 2 (two) times daily.   Yes Historical Provider, MD  bisacodyl (DULCOLAX) 5 MG EC tablet Take 5 mg by mouth daily as needed. For constipation   Yes Historical Provider, MD  bismuth subsalicylate (PEPTO BISMOL) 262 MG/15ML suspension Take 15 mLs by mouth 2 (two) times daily as needed.   Yes Historical Provider, MD  diltiazem (DILACOR XR) 240 MG 24 hr capsule Take 240 mg by mouth daily.   Yes Historical Provider, MD  ezetimibe-simvastatin (VYTORIN) 10-40 MG per tablet Take 1 tablet by mouth at bedtime.   Yes Historical Provider,  MD  furosemide (LASIX) 40 MG tablet Take 40 mg by mouth 2 (two) times daily.    Yes Historical Provider, MD  guaiFENesin-dextromethorphan (ROBITUSSIN DM) 100-10 MG/5ML syrup Take 5 mLs by mouth at bedtime as needed. For cough   Yes Historical Provider, MD  hydrALAZINE (APRESOLINE) 50 MG tablet Take 50 mg by mouth 2 (two) times daily.   Yes Historical Provider, MD  insulin lispro protamine-insulin lispro (HUMALOG 75/25) (75-25) 100 UNIT/ML SUSP Inject 65 Units into the skin 2 (two) times daily with a meal.    Yes Historical Provider, MD  isosorbide mononitrate (IMDUR) 60 MG 24 hr tablet Take 60 mg by mouth daily.   Yes Historical Provider, MD  losartan-hydrochlorothiazide (HYZAAR) 100-25 MG per tablet Take 1 tablet by mouth daily.   Yes Historical Provider, MD  medroxyPROGESTERone (PROVERA) 10 MG tablet Take 10 mg by mouth daily as needed. Takes only when bleeding   Yes Historical Provider, MD  metoprolol (TOPROL-XL) 200 MG 24 hr tablet Take 200 mg by mouth daily.   Yes Historical Provider, MD  naproxen sodium (ANAPROX) 220 MG tablet Take 440 mg by mouth daily as needed. For pain   Yes Historical Provider, MD  nitroGLYCERIN (NITROSTAT) 0.4 MG SL tablet Place 0.4 mg under the tongue every 5 (five) minutes as needed. For chest pain   Yes Historical Provider, MD  oxycodone (OXY-IR) 5 MG capsule Take 5 mg by  mouth every 6 (six) hours as needed. For pain   Yes Historical Provider, MD  pantoprazole (PROTONIX) 40 MG tablet Take 40 mg by mouth daily.   Yes Historical Provider, MD  polyvinyl alcohol (LIQUIFILM TEARS) 1.4 % ophthalmic solution Place 1 drop into both eyes daily as needed. For dry eyes   Yes Historical Provider, MD  potassium chloride (K-DUR,KLOR-CON) 10 MEQ tablet Take 10 mEq by mouth daily.   Yes Historical Provider, MD  ranolazine (RANEXA) 500 MG 12 hr tablet Take 500 mg by mouth 2 (two) times daily.   Yes Historical Provider, MD   Allergies Allergies  Allergen Reactions  . Lorazepam  Anaphylaxis    Family History No family history on file. Social History  reports that she has never smoked. She does not have any smokeless tobacco history on file. She reports that she does not drink alcohol or use illicit drugs.  Review Of Systems:   Unable to obtain ,sedated   Brief patient description:   56 yo female admited 9/2 for gangrenous left buttock /perineal abscess.  Difficulties post op . 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  INCISION, DRAINAGE, AND SHARP DEBRIDEMENT LEFT BUTTOCK/PERINEAL ABSCESS   Current Status: improving  Vital Signs: Temp:  [97.2 F (36.2 C)-99.2 F (37.3 C)] 98.1 F (36.7 C) (09/02 1530) Pulse Rate:  [89-110] 91  (09/02 1615) Resp:  [14-24] 16  (09/02 1615) BP: (118-147)/(52-72) 140/63 mmHg (09/02 1609) SpO2:  [99 %-100 %] 100 % (09/02 1615) Arterial Line BP: (165-176)/(54-66) 175/61 mmHg (09/02 1615)  Physical Examination: General:  Obese. Extubated. Now in ICU. Looks unwell Neuro:  Appears oriented. Moving all 4s. Answering all questions appropriately HEENT: PEERL +   Neck:  Obese, supple. Mallampatti class 4 Cardiovascular:  S1S2+. No murmurs. No elevated JVP Lungs:  CT abilaterally. Normal breath sounds Abdomen:  Obese, soft, non tender. Normal bowel sunds GU: Bloody dressing in GU area Musculoskeletal:  No edema. No cyanosis . No clubbing. LONG 4" NAIL + Skin:  Itnact  Ct Abdomen Pelvis Wo Contrast  04/03/2012  *RADIOLOGY REPORT*  Clinical Data: Diabetic with left buttock abscess.  CT ABDOMEN AND PELVIS WITHOUT CONTRAST  Technique:  Multidetector CT imaging of the abdomen and pelvis was performed following the standard protocol without intravenous contrast.  Comparison: None.  Findings: Gas-forming infectious process is identified in the subcutaneous fat of the left medial buttocks.  Gas extends deep  into the subcutaneous fat and approaches the perineum.  There is no evidence of extension into the peritoneal cavity.  Soft tissue gas and surrounding inflammatory changes are seen without evidence of a significant component of fluid or focal drainable abscess.  The liver, gallbladder, pancreas, spleen, adrenal glands and kidneys are unremarkable by unenhanced CT.  Bowel loops are normal caliber.  The bladder is unremarkable.  Uterus and adnexal regions are unremarkable by CT.  IMPRESSION: Gas-forming infectious process in the subcutaneous fat of the medial left buttocks.  Gas approaches the perineum.  No evidence of focal fluid collection or drainable abscess by CT.   Original Report Authenticated By: Azzie Roup, M.D.    Dg Chest Port 1 View  04/03/2012  *RADIOLOGY REPORT*  Clinical Data: Central venous catheter placement  PORTABLE CHEST - 1 VIEW  Comparison: 04/03/2012  Findings: There is a left subclavian catheter with tip in the projection of the  SVC.  No pneumothorax identified.  Heart size appears enlarged.  There is no pleural effusion or edema identified.  No airspace consolidation identified.  IMPRESSION:  1.  No pneumothorax after catheter placement.   Original Report Authenticated By: Angelita Ingles, M.D.    Dg Chest Portable 1 View  04/03/2012  *RADIOLOGY REPORT*  Clinical Data: Shortness of breath.  Chest pain.  Nausea vomiting and fever.  Elevated blood pressure.  Hypertension.  Diabetes.  PORTABLE CHEST - 1 VIEW  Comparison: 01/15/2012  Findings: Midline trachea.  Mild cardiomegaly. No pleural effusion or pneumothorax.  Mildly low lung volumes with resultant interstitial prominence.  Patchy areas of subsegmental atelectasis at the bases.  IMPRESSION: Cardiomegaly and lung volumes. No acute findings.   Original Report Authenticated By: Areta Haber, M.D.      Principal Problem:  *Sepsis Active Problems:  ANEMIA, IRON DEFICIENCY  Perirectal abscess  Diabetes  mellitus   ASSESSMENT AND PLAN  PULMONARY  Lab 04/03/12 1355 04/03/12 0751  PHART 7.355 --  PCO2ART 39.2 --  PO2ART 297.0* --  HCO3 21.9 22.7  O2SAT 100.0 70.0   Ventilator Settings:     A:  Extubated post op uneventfully. Normal CXR . High pre test for OSA P:   Maintain sats Monitor Auto cpap qhs (emprici)   CARDIOVASCULAR  Lab 04/03/12 0737  TROPONINI 0.35*  LATICACIDVEN --  PROBNP --   A: Hx of CAD  P:  Serial trop  RENAL  Lab 04/03/12 1355 04/03/12 0740  NA 134* 130*  K 3.6 3.4*  CL -- 93*  CO2 -- 22  BUN -- 15  CREATININE -- 1.26*  CALCIUM -- 9.1  MG -- --  PHOS -- --   Intake/Output      09/01 0701 - 09/02 0700 09/02 0701 - 09/03 0700   I.V.  400   Total Intake  400   Urine  850   Blood  50   Total Output  900   Net  -500         Foley:    A:  Renal Insufficiency  At admit  P:   Recheck BMET  stat and adjust fluids montior   GASTROINTESTINAL No results found for this basename: AST:5,ALT:5,ALKPHOS:5,BILITOT:5,PROT:5,ALBUMIN:5 in the last 168 hours  A:  Nil acute P:   Monitor   HEMATOLOGIC  Lab 04/03/12 1355 04/03/12 0944 04/03/12 0740  HGB 7.5* -- 8.0*  HCT 22.0* -- 24.2*  PLT -- -- 342  INR -- 1.71* --  APTT -- 35 --   A:  Anemia of ? Cause (hgb 11.7 in June 2013) and admit 8gm% P:  Recheck cbc PRBC for hgb < 7gm%   INFECTIOUS  Lab 04/03/12 0740  WBC 24.8*  PROCALCITON --   Cultures: Results for orders placed during the hospital encounter of 04/03/12  WET PREP, GENITAL     Status: Abnormal   Collection Time   04/03/12  9:51 AM      Component Value Range Status Comment   Yeast Wet Prep HPF POC NONE SEEN  NONE SEEN Final    Trich, Wet Prep NONE SEEN  NONE SEEN Final    Clue Cells Wet Prep HPF POC MODERATE (*) NONE SEEN Final    WBC, Wet Prep HPF POC FEW (*) NONE SEEN Final      Antibiotics: Anti-infectives     Start     Dose/Rate Route Frequency Ordered Stop   04/03/12 2200  piperacillin-tazobactam  (ZOSYN) IVPB 3.375  g       3.375 g 12.5 mL/hr over 240 Minutes Intravenous 3 times per day 04/03/12 1712     04/03/12 2000   clindamycin (CLEOCIN) IVPB 300 mg     Comments: Pharmacy may adjust dose      300 mg 100 mL/hr over 30 Minutes Intravenous Every 8 hours 04/03/12 1654     04/03/12 1200   clindamycin (CLEOCIN) IVPB 600 mg  Status:  Discontinued        600 mg 100 mL/hr over 30 Minutes Intravenous  Once 04/03/12 1152 04/03/12 1716   04/03/12 1200  piperacillin-tazobactam (ZOSYN) IVPB 3.375 g       3.375 g 12.5 mL/hr over 240 Minutes Intravenous  Once 04/03/12 1152     04/03/12 1100   vancomycin (VANCOCIN) 2,000 mg in sodium chloride 0.9 % 500 mL IVPB        2,000 mg 250 mL/hr over 120 Minutes Intravenous To Emergency Dept 04/03/12 0957 04/03/12 1334           A:  Gangrenous perineal abscess  P:   Check sepsis biomarkers and adjust fluids vanc and zosyn and clinda per CCS  ENDOCRINE  Lab 04/03/12 1025 04/03/12 0658  GLUCAP 339* 316*   A:    Hx of Insulin depend DM   P:   ICU hyperglycemia protocol  NEUROLOGIC  A:  Pain post op mild P:   Per ccs   BEST PRACTICE / DISPOSITION Level of Care:  ICU  Primary Service:  CCS  Consultants:  pccm Code Status:  full Diet:  npo DVT Px:  scd GI Px: monuitor Skin Integrity:   Social / Family:  None at bedside    Dr. Brand Males, M.D., F.C.C.P Pulmonary and Critical Care Medicine Staff Physician [facility] Pager: [phone], If no answer or between  15:00h - 7:00h: call [phone]  04/03/2012 5:31 PM

## 2012-04-03 NOTE — ED Provider Notes (Addendum)
History     CSN: [id number]  Arrival date & time 04/03/12  [id number]   First MD Initiated Contact with Patient 04/03/12 0703      Chief Complaint  Patient presents with  . Chest Pain    (Consider location/radiation/quality/duration/timing/severity/associated sxs/prior treatment) HPI Jovani HADLYNN DENKE is a 56 y.o. female history of known three-vessel disease he was considered not to be an operative candidate or candidate for PCI who presents with 2 days history of nausea vomiting and diarrhea with some chest pain started this morning. She says it's a squeezing or crushing pain, moderate, spreads to the left side similar to prior episodes of chest pain. He's had some relief with nitroglycerin, she took 3 at home and had one via squad.  She takes aspirin daily.  She complains of some mild dizziness on ambulation and when changing position as well. She says her vomiting and diarrhea has been severe, she has not made it to the toilet on occasion. It is been constant for 2 days without alleviating or exacerbating factors. She's had no fevers. No blood in the stool  Past Medical History  Diagnosis Date  . Diabetes mellitus   . Hypertension   . Hyperlipemia   . Coronary artery disease   . Headache   . Chest pain at rest 01/15/2012  . Nausea and vomiting, secondary to viral syndrome 01/17/2012    Past Surgical History  Procedure Date  . Cardiac catheterization     No family history on file.  History  Substance Use Topics  . Smoking status: Never Smoker   . Smokeless tobacco: Not on file  . Alcohol Use: No    OB History    Grav Para Term Preterm Abortions TAB SAB Ect Mult Living                  Review of Systems Patient denies any fevers or chills, changes in vision, earache, sore throat, neck pain or stiffness, chest pain or pressure, palpitations, syncope, dyspnea, cough, wheezing,  abdominal pain, nausea, vomiting, diarrhea, melena, red bloody stools, frequency, dysuria, myalgias,  arthralgias, back pain, recent trauma, rash, itching, skin lesions, easy bruising or bleeding, headache, seizures, numbness, tingling or weakness and denies depression, and anxiety.    Allergies  Lorazepam  Home Medications   Current Outpatient Rx  Name Route Sig Dispense Refill  . ACETAMINOPHEN 500 MG PO TABS Oral Take 500 mg by mouth every 6 (six) hours as needed. For pain    . ASPIRIN 81 MG PO TABS Oral Take 81 mg by mouth daily.    . ATROPINE SULFATE 1 % OP SOLN Right Eye Place 1 drop into the right eye 2 (two) times daily.    Marland Kitchen BISACODYL 5 MG PO TBEC Oral Take 5 mg by mouth daily as needed. For constipation    . BISMUTH SUBSALICYLATE 99991111 [id number] PO SUSP Oral Take 15 mLs by mouth 2 (two) times daily as needed.    Marland Kitchen DILTIAZEM HCL ER 240 MG PO CP24 Oral Take 240 mg by mouth daily.    Marland Kitchen EZETIMIBE-SIMVASTATIN 10-40 MG PO TABS Oral Take 1 tablet by mouth at bedtime.    . FUROSEMIDE 40 MG PO TABS Oral Take 40 mg by mouth 2 (two) times daily.     . GUAIFENESIN-DM 100-10 MG/5ML PO SYRP Oral Take 5 mLs by mouth at bedtime as needed. For cough    . HYDRALAZINE HCL 50 MG PO TABS Oral Take 50 mg by mouth 2 (two)  times daily.    . INSULIN LISPRO PROT & LISPRO (75-25) 100 UNIT/ML [place] SUSP Subcutaneous Inject 65 Units into the skin 2 (two) times daily with a meal.     . ISOSORBIDE MONONITRATE ER 60 MG PO TB24 Oral Take 60 mg by mouth daily.    Marland Kitchen LOSARTAN POTASSIUM-HCTZ 100-25 MG PO TABS Oral Take 1 tablet by mouth daily.    Marland Kitchen MEDROXYPROGESTERONE ACETATE 10 MG PO TABS Oral Take 10 mg by mouth daily as needed. Takes only when bleeding    . METOPROLOL SUCCINATE ER 200 MG PO TB24 Oral Take 200 mg by mouth daily.    Marland Kitchen NAPROXEN SODIUM 220 MG PO TABS Oral Take 440 mg by mouth daily as needed. For pain    . NITROGLYCERIN 0.4 MG SL SUBL Sublingual Place 0.4 mg under the tongue every 5 (five) minutes as needed. For chest pain    . OXYCODONE HCL 5 MG PO CAPS Oral Take 5 mg by mouth every 6 (six) hours as needed.  For pain    . PANTOPRAZOLE SODIUM 40 MG PO TBEC Oral Take 40 mg by mouth daily.    Marland Kitchen POLYVINYL ALCOHOL 1.4 % OP SOLN Both Eyes Place 1 drop into both eyes daily as needed. For dry eyes    . POTASSIUM CHLORIDE CRYS ER 10 MEQ PO TBCR Oral Take 10 mEq by mouth daily.    Marland Kitchen RANOLAZINE ER 500 MG PO TB12 Oral Take 500 mg by mouth 2 (two) times daily.      BP 121/52  Pulse 104  Temp 99.2 F (37.3 C) (Oral)  Resp 19  SpO2 100%  Physical Exam  Nursing notes reviewed.  Electronic medical record reviewed. VITAL SIGNS:   Filed Vitals:   04/12/12 2210 04/13/12 0505 04/13/12 1040 04/13/12 1408  BP: 177/72 170/67 157/80 152/65  Pulse: 83 78 81 90  Temp: 97.9 F (36.6 C) 98.1 F (36.7 C)  99.4 F (37.4 C)  TempSrc: Oral Oral    Resp: 16 18  20   Height:      Weight:      SpO2: 98% 100%  100%   CONSTITUTIONAL: Awake, oriented, appears non-toxic HENT: Atraumatic, normocephalic, oral mucosa pink and moist, airway patent. Nares patent without drainage. External ears normal. EYES: Conjunctiva clear, EOMI, PERRLA NECK: Trachea midline, non-tender, supple CARDIOVASCULAR: Normal heart rate, Normal rhythm, No murmurs, rubs, gallops PULMONARY/CHEST: Clear to auscultation, no rhonchi, wheezes, or rales. Symmetrical breath sounds. Non-tender. ABDOMINAL: Non-distended, soft, non-tender - no rebound or guarding.  BS normal. NEUROLOGIC: Non-focal, moving all four extremities, no gross sensory or motor deficits. EXTREMITIES: No clubbing, cyanosis, or edema SKIN: Warm, Dry, No erythema.  Draining ulcer in the perianal region-subcutaneous air, foul-smelling. PELVIC EXAM: normal external genitalia, vulva, vagina, cervix, uterus and adnexa. ED Course  Procedures (including critical care time)  Labs Reviewed  GLUCOSE, CAPILLARY - Abnormal; Notable for the following:    Glucose-Capillary 316 (*)     All other components within normal limits  POCT I-STAT 3, BLOOD GAS (G3P V) - Abnormal; Notable for the  following:    pH, Ven 7.446 (*)     pCO2, Ven 32.9 (*)     All other components within normal limits  BASIC METABOLIC PANEL  URINALYSIS, ROUTINE W REFLEX MICROSCOPIC  TROPONIN I  CBC  BETA-HYDROXYBUTYRIC ACID   No results found. CT Abdomen Pelvis Wo Contrast (Final result)   Result time:04/03/12 1116    Final result by Rad Results In Interface (04/03/12  11:16:49)    Narrative:   *RADIOLOGY REPORT*  Clinical Data: Diabetic with left buttock abscess.  CT ABDOMEN AND PELVIS WITHOUT CONTRAST  Technique: Multidetector CT imaging of the abdomen and pelvis was performed following the standard protocol without intravenous contrast.  Comparison: None.  Findings: Gas-forming infectious process is identified in the subcutaneous fat of the left medial buttocks. Gas extends deep into the subcutaneous fat and approaches the perineum. There is no evidence of extension into the peritoneal cavity. Soft tissue gas and surrounding inflammatory changes are seen without evidence of a significant component of fluid or focal drainable abscess.  The liver, gallbladder, pancreas, spleen, adrenal glands and kidneys are unremarkable by unenhanced CT. Bowel loops are normal caliber. The bladder is unremarkable. Uterus and adnexal regions are unremarkable by CT.  IMPRESSION: Gas-forming infectious process in the subcutaneous fat of the medial left buttocks. Gas approaches the perineum. No evidence of focal fluid collection or drainable abscess by CT.      1. Perirectal abscess   2. Obesity, morbid   3. Coronary atherosclerosis of unspecified type of vessel, native or graft   4. Diabetes mellitus   5. Sepsis   6. Essential hypertension, benign   7. Iron deficiency anemia, unspecified   8. NSTEMI (non-ST elevated myocardial infarction)   9. OSA (obstructive sleep apnea)   10. Bacterial vaginosis   11. Diabetes mellitus type 2, uncontrolled   12. Nausea and vomiting   13. Chronic diastolic  heart failure, NYHA class 1   14. Dizziness and giddiness       MDM  Thao NATHALIA HERMRECK is a 56 y.o. female presenting with some chest pain, she does have chronic chest pain the 2 known three-vessel disease and is not a surgical or PCI candidate due to her other comorbidities.   Her initial EKG looks very similar to her prior EKG done June 15 of this year, it is sinus tachycardia at 106 beats per minute with a some diffuse T-wave flattening again seen on prior EKG in the anterior lateral and inferior leads. Normal axis, normal intervals, nothing suggesting acute ischemia or infarction at this time.  Patient's history is consistent with acute gastroenteritis times last 2 days, and this is supported by her difficulty controlling her blood sugars and elevation on admission, we'll give her some fluids to treat her sinus tachycardia her elevated blood sugars and give her some Zofran for nausea.  Patient was also complaining about pelvic discharge and odor "like a dead dog" so performed a pelvic exam, there is scant amount of dark red blood in the vault and foul odor. Patient does have a left buttock abscess that is draining a yellow fluid with the foul gassy odor. She says she scratched herself there, it has been sore but does not hurt. The area of induration is approximately 3 x 8 cm.  Concern with this is that she has beginnings of Fournier's gangrene, we'll obtain a CT of the abdomen and pelvis without contrast it also includes the tops of the thighs so we can see the extent of this - will not use contrast as the patient's hemoglobin is 8.0, she is known cardiac disease and an increase in creatinine, I think any kidney insult at this point would be exacerbated by her low hemoglobin and cardiac output and could easily send her into acute renal failure.  Patient's white count is elevated, start her on vancomycin, she is receiving fluids. She is not overtly septic at this point however  I think she does have  an infection will need to be hospitalized.  In addition her occult blood card was positive. Rectal exam reveals a large firm stool in the vault - and I think she likely has some encoparesis versus acute gastroenteritis. Patient does relate afterward that she does have long-standing history of constipation.  Vanc/Clinda/Zosyn ordered.  CT of Abdomen/Pelvis shows gas in the [facility] tissue of the left buttock extending to the perineum: "Gas-forming infectious process in the subcutaneous fat of the medial left buttocks. Gas approaches the perineum. No evidence of focal fluid collection or drainable abscess by CT."    Discussed with surgery at 1150.  Dr. Ninfa Linden - he evaluated the pt and will take her to the OR for I&D of perirectal abscess.   Rhunette Croft, MD 05/19/12 1659  Rhunette Croft, MD 05/19/12 1701

## 2012-04-03 NOTE — ED Notes (Signed)
Pt to go to OR.  OR ready. Dr. Ninfa Linden at bedside

## 2012-04-03 NOTE — Plan of Care (Signed)
Problem: Phase I Progression Outcomes Goal: Voiding-avoid urinary catheter unless indicated Outcome: Not Met (add Reason) Foley catheter necessary due to draining, open perirectal abscess

## 2012-04-03 NOTE — Anesthesia Postprocedure Evaluation (Signed)
  Anesthesia Post-op Note  Patient: [name]  Procedure(s) Performed: Procedure(s) (LRB): IRRIGATION AND DEBRIDEMENT PERIRECTAL ABSCESS (Left)  Patient Location: PACU  Anesthesia Type: General  Level of Consciousness: awake, alert  and oriented  Airway and Oxygen Therapy: Patient Spontanous Breathing and Patient connected to nasal cannula oxygen  Post-op Pain: mild  Post-op Assessment: Post-op Vital signs reviewed and Patient's Cardiovascular Status Stable  Post-op Vital Signs: stable  Complications: No apparent anesthesia complications

## 2012-04-03 NOTE — H&P (Signed)
[name] is an 56 y.o. female.   Chief Complaint: perianal pain HPI: 56 year old female with 2 day history of worsening perianal discomfort and drainage presented to ER.  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 Patient has significant comorbidities.  Past Medical History  Diagnosis Date  . Diabetes mellitus   . Hypertension   . Hyperlipemia   . Coronary artery disease   . Headache   . Chest pain at rest 01/15/2012  . Nausea and vomiting, secondary to viral syndrome 01/17/2012    Past Surgical History  Procedure Date  . Cardiac catheterization     No family history on file. Social History:  reports that she has never smoked. She does not have any smokeless tobacco history on file. She reports that she does not drink alcohol or use illicit drugs.  Allergies:  Allergies  Allergen Reactions  . Lorazepam Anaphylaxis     (Not in a hospital admission)  Results for orders placed during the hospital encounter of 04/03/12 (from the past 48 hour(s))  GLUCOSE, CAPILLARY     Status: Abnormal   Collection Time   04/03/12  6:58 AM      Component Value Range Comment   Glucose-Capillary 316 (*) 70 - 99 mg/dL   TROPONIN I     Status: Abnormal   Collection Time   04/03/12  7:37 AM      Component Value Range Comment   Troponin I 0.35 (*) <0.30 ng/mL   BASIC METABOLIC PANEL     Status: Abnormal   Collection Time   04/03/12  7:40 AM      Component Value Range Comment   Sodium 130 (*) 135 - 145 mEq/L    Potassium 3.4 (*) 3.5 - 5.1 mEq/L    Chloride 93 (*) 96 - 112 mEq/L    CO2 22  19 - 32 mEq/L    Glucose, Bld 361 (*) 70 - 99 mg/dL    BUN 15  6 - 23 mg/dL    Creatinine, Ser 1.26 (*) 0.50 - 1.10 mg/dL    Calcium 9.1  8.4 - 10.5 mg/dL    GFR calc non Af Amer 46 (*) >90 mL/min    GFR calc Af Amer 54 (*) >90 mL/min   CBC     Status: Abnormal   Collection Time   04/03/12  7:40 AM      Component Value Range  Comment   WBC 24.8 (*) 4.0 - 10.5 K/uL    RBC 3.17 (*) 3.87 - 5.11 MIL/uL    Hemoglobin 8.0 (*) 12.0 - 15.0 g/dL    HCT 24.2 (*) 36.0 - 46.0 %    MCV 76.3 (*) 78.0 - 100.0 fL    MCH 25.2 (*) 26.0 - 34.0 pg    MCHC 33.1  30.0 - 36.0 g/dL    RDW 14.8  11.5 - 15.5 %    Platelets 342  150 - 400 K/uL   POCT I-STAT 3, BLOOD GAS (G3P V)     Status: Abnormal   Collection Time   04/03/12  7:51 AM      Component Value Range Comment   pH, Ven 7.446 (*) 7.250 - 7.300    pCO2, Ven 32.9 (*) 45.0 - 50.0 mmHg    pO2, Ven 34.0  30.0 - 45.0 mmHg    Bicarbonate 22.7  20.0 - 24.0 mEq/L    TCO2 24  0 - 100 mmol/L    O2 Saturation 70.0      Acid-base deficit 1.0  0.0 - 2.0 mmol/L    Sample type VENOUS      Comment NOTIFIED PHYSICIAN     URINALYSIS, ROUTINE W REFLEX MICROSCOPIC     Status: Abnormal   Collection Time   04/03/12  8:57 AM      Component Value Range Comment   Color, Urine YELLOW  YELLOW    APPearance CLOUDY (*) CLEAR    Specific Gravity, Urine 1.024  1.005 - 1.030    pH 5.5  5.0 - 8.0    Glucose, UA >1000 (*) NEGATIVE mg/dL    Hgb urine dipstick NEGATIVE  NEGATIVE    Bilirubin Urine NEGATIVE  NEGATIVE    Ketones, ur 40 (*) NEGATIVE mg/dL    Protein, ur 30 (*) NEGATIVE mg/dL    Urobilinogen, UA 1.0  0.0 - 1.0 mg/dL    Nitrite NEGATIVE  NEGATIVE    Leukocytes, UA NEGATIVE  NEGATIVE   URINE MICROSCOPIC-ADD ON     Status: Abnormal   Collection Time   04/03/12  8:57 AM      Component Value Range Comment   Squamous Epithelial / LPF FEW (*) RARE    WBC, UA 0-2  <3 WBC/hpf    RBC / HPF 0-2  <3 RBC/hpf    Bacteria, UA FEW (*) RARE    Casts GRANULAR CAST (*) NEGATIVE   OCCULT BLOOD, POC DEVICE     Status: Normal   Collection Time   04/03/12  9:16 AM      Component Value Range Comment   Fecal Occult Bld POSITIVE     PROTIME-INR     Status: Abnormal   Collection Time   04/03/12  9:44 AM      Component Value Range Comment   Prothrombin Time 20.4 (*) 11.6 - 15.2 seconds    INR 1.71 (*) 0.00 -  1.49   APTT     Status: Normal   Collection Time   04/03/12  9:44 AM      Component Value Range Comment   aPTT 35  24 - 37 seconds   TYPE AND SCREEN     Status: Normal   Collection Time   04/03/12  9:49 AM      Component Value Range Comment   ABO/RH(D) B POS      Antibody Screen NEG      Sample Expiration 04/06/2012     WET PREP, GENITAL     Status: Abnormal   Collection Time   04/03/12  9:51 AM      Component Value Range Comment   Yeast Wet Prep HPF POC NONE SEEN  NONE SEEN    Trich, Wet Prep NONE SEEN  NONE SEEN    Clue Cells Wet Prep HPF POC MODERATE (*) NONE SEEN    WBC, Wet Prep HPF POC FEW (*) NONE SEEN   GLUCOSE, CAPILLARY     Status: Abnormal   Collection Time   04/03/12 10:25 AM      Component Value Range Comment   Glucose-Capillary 339 (*) 70 - 99 mg/dL    Comment 1 Notify RN      Ct Abdomen Pelvis Wo Contrast  04/03/2012  *RADIOLOGY REPORT*  Clinical Data: Diabetic with left buttock abscess.  CT ABDOMEN AND PELVIS WITHOUT CONTRAST  Technique:  Multidetector CT imaging of the abdomen and pelvis was performed following the standard protocol without intravenous contrast.  Comparison:  None.  Findings: Gas-forming infectious process is identified in the subcutaneous fat of the left medial buttocks.  Gas extends deep into the subcutaneous fat and approaches the perineum.  There is no evidence of extension into the peritoneal cavity.  Soft tissue gas and surrounding inflammatory changes are seen without evidence of a significant component of fluid or focal drainable abscess.  The liver, gallbladder, pancreas, spleen, adrenal glands and kidneys are unremarkable by unenhanced CT.  Bowel loops are normal caliber.  The bladder is unremarkable.  Uterus and adnexal regions are unremarkable by CT.  IMPRESSION: Gas-forming infectious process in the subcutaneous fat of the medial left buttocks.  Gas approaches the perineum.  No evidence of focal fluid collection or drainable abscess by CT.   Original  Report Authenticated By: Azzie Roup, M.D.    Dg Chest Portable 1 View  04/03/2012  *RADIOLOGY REPORT*  Clinical Data: Shortness of breath.  Chest pain.  Nausea vomiting and fever.  Elevated blood pressure.  Hypertension.  Diabetes.  PORTABLE CHEST - 1 VIEW  Comparison: 01/15/2012  Findings: Midline trachea.  Mild cardiomegaly. No pleural effusion or pneumothorax.  Mildly low lung volumes with resultant interstitial prominence.  Patchy areas of subsegmental atelectasis at the bases.  IMPRESSION: Cardiomegaly and lung volumes. No acute findings.   Original Report Authenticated By: Areta Haber, M.D.     Review of Systems  Constitutional: Positive for malaise/fatigue. Negative for fever and chills.  HENT: Negative.   Eyes: Negative.   Respiratory: Negative for cough and shortness of breath.   Cardiovascular: Negative for chest pain.  Gastrointestinal: Negative for heartburn, nausea and vomiting.  Genitourinary: Negative.   Musculoskeletal: Negative.   Skin: Positive for itching.  Neurological: Positive for weakness.  Endo/Heme/Allergies: Negative.   Psychiatric/Behavioral: Negative.     Blood pressure 136/72, pulse 102, temperature 98.3 F (36.8 C), temperature source Oral, resp. rate 20, SpO2 100.00%. Physical Exam  Constitutional: She is oriented to person, place, and time. No distress.       Morbidly obese female   HENT:  Head: Normocephalic and atraumatic.  Right Ear: External ear normal.  Left Ear: External ear normal.  Nose: Nose normal.  Mouth/Throat: Oropharynx is clear and moist. No oropharyngeal exudate.  Eyes: Conjunctivae are normal. Pupils are equal, round, and reactive to light. Right eye exhibits no discharge. Left eye exhibits no discharge. No scleral icterus.  Neck: Normal range of motion. Neck supple. No tracheal deviation present.  Cardiovascular: Normal heart sounds.   No murmur heard.      tachycardia  Respiratory: Effort normal and breath sounds normal.  No respiratory distress. She has no wheezes.  GI: Soft. Bowel sounds are normal. She exhibits no distension. There is no tenderness. There is no rebound.  Genitourinary:       Crepitus of left inner thigh/buttock area going to perineum with tenderness and small area of draining purulence. Moderate induration  Musculoskeletal: Normal range of motion. She exhibits edema. She exhibits no tenderness.  Lymphadenopathy:    She has no cervical adenopathy.  Neurological: She is alert and oriented to person, place, and time.  Skin: Skin is warm. She is not diaphoretic. There is erythema.  Psychiatric: Her behavior is normal. Judgment normal.     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  I discussed this with her in detail.  I do not think she could survive without surgery.  She does have significant comorbidities.  The risks of surgery include bleeding, infection, need for further surgery, incontinence, cardiopulmonary problems, need for prolonged ventilation, and even death.  She understands and agrees to proceed.  Surgery scheduled emergently.  Shyra Emile A 04/03/2012, 12:15 PM

## 2012-04-03 NOTE — Progress Notes (Signed)
ANTIBIOTIC CONSULT NOTE - INITIAL  Pharmacy Consult for Zosyn Indication: left buttocks gangrene  Allergies  Allergen Reactions  . Lorazepam Anaphylaxis    Patient Measurements:  TBW 128.6 kg   Vital Signs: Temp: 98.1 F (36.7 C) (09/02 1630) Temp src: Oral (09/02 0652) BP: 126/77 mmHg (09/02 1625) Pulse Rate: 91  (09/02 1625) Intake/Output from previous day:   Intake/Output from this shift: Total I/O In: 550 [I.V.:550] Out: 1100 [Urine:1050; Blood:50]  Labs:  [facility] 04/03/12 1355 04/03/12 0740  WBC -- 24.8*  HGB 7.5* 8.0*  PLT -- 342  LABCREA -- --  CREATININE -- 1.26*   The CrCl is unknown because both a height and weight (above a minimum accepted value) are required for this calculation. No results found for this basename: VANCOTROUGH:2,VANCOPEAK:2,VANCORANDOM:2,GENTTROUGH:2,GENTPEAK:2,GENTRANDOM:2,TOBRATROUGH:2,TOBRAPEAK:2,TOBRARND:2,AMIKACINPEAK:2,AMIKACINTROU:2,AMIKACIN:2, in the last 72 hours   Microbiology: Recent Results (from the past 720 hour(s))  WET PREP, GENITAL     Status: Abnormal   Collection Time   04/03/12  9:51 AM      Component Value Range Status Comment   Yeast Wet Prep HPF POC NONE SEEN  NONE SEEN Final    Trich, Wet Prep NONE SEEN  NONE SEEN Final    Clue Cells Wet Prep HPF POC MODERATE (*) NONE SEEN Final    WBC, Wet Prep HPF POC FEW (*) NONE SEEN Final     Medical History: Past Medical History  Diagnosis Date  . Diabetes mellitus   . Hypertension   . Hyperlipemia   . Coronary artery disease   . Headache   . Chest pain at rest 01/15/2012  . Nausea and vomiting, secondary to viral syndrome 01/17/2012   Assessment: 1 yof admitted for left buttocks infection and underwent I&D 9/2.  Tm 99, WBC elevated 24.8 Cr 1.36 CrCl > 21ml/min.  Plan to start zosyn and clindamycin and f/u Cx data.  Goal of Therapy:  Resolution of infection  Plan:  Zosyn 3.375gm IV q8 EI  Nadine Counts 04/03/2012,5:04 PM

## 2012-04-03 NOTE — ED Notes (Signed)
NPO since 9/1 at 2000.

## 2012-04-03 NOTE — ED Notes (Signed)
Pt brought to ED by EMS with acute chest pain and complains of vaginal discharge .P also complains of nausea.

## 2012-04-03 NOTE — Anesthesia Procedure Notes (Signed)
Procedure Name: Intubation Date/Time: 04/03/2012 1:30 PM Performed by: Sampson Si E Pre-anesthesia Checklist: Patient identified, Timeout performed, Emergency Drugs available, Suction available and Patient being monitored Patient Re-evaluated:Patient Re-evaluated prior to inductionOxygen Delivery Method: Circle system utilized Preoxygenation: Pre-oxygenation with 100% oxygen Intubation Type: IV induction, Rapid sequence and Cricoid Pressure applied Laryngoscope Size: Mac and 3 Grade View: Grade I Tube type: Oral Tube size: 7.0 mm Number of attempts: 1 Airway Equipment and Method: Stylet Placement Confirmation: ETT inserted through vocal cords under direct vision,  breath sounds checked- equal and bilateral and positive ETCO2 Secured at: 21 cm Tube secured with: Tape Dental Injury: Teeth and Oropharynx as per pre-operative assessment

## 2012-04-03 NOTE — ED Notes (Signed)
Pt brought to ED by EMS with chest pain.Pt describes it as crushing pain spreading to the left side.

## 2012-04-03 NOTE — Op Note (Signed)
IRRIGATION AND DEBRIDEMENT PERIRECTAL ABSCESS  Procedure Note  [name] 04/03/2012   Pre-op Diagnosis: left buttock abscess, gaseous gangrene     Post-op Diagnosis: same  Procedure(s): INCISION, DRAINAGE, AND SHARP DEBRIDEMENT LEFT BUTTOCK/PERINEAL ABSCESS  Surgeon(s): Harl Bowie, MD  Anesthesia: General  Staff:  Cyd Silence, RN - Circulator Mindy Evern Bio, CST - Scrub Person Eudelia Bunch - Circulator  Estimated Blood Loss: Minimal               Indications: This is Burns 56 year old female with multiple medical problems including poorly controlled diabetes who presented with nausea and vomiting as well as perineal discomfort. She was found to have Burns left buttock abscess. Her white blood count was 24,000. CAT scan shows Burns large amount of gas in the soft tissue worrisome for gas gangrene. The decision was made to proceed emergently to the operating room.  Findings: The patient was found to have Burns large abscess and gangrenous infection in the left buttock and perianal area with some necrotic fat. The underlying muscle appeared viable.  Procedure: The patient was brought to the operating room and identified as the correct patient. She was placed supine on the operating table and general anesthesia was induced. The patient was then placed in the lithotomy position. Her buttocks and perineum were then prepped and draped in the usual sterile fashion. I made Burns large elliptical incision at the pedicle of the indurated tissue on the left medial buttock. Burns large abscess cavity with purulence was identified. Cultures were obtained. This tracked toward the perineum and anus as well as the medial thigh. There was necrotic fat which I sharply debrided. The underlying muscle appeared intact. I did Burns rectal exam and found no medication with the anal canal. I opened up some of the fashion of the medial muscle compartment and again the muscle appeared viable. I irrigated the wound  extensively. I injected Marcaine with epinephrine circumferentially. I achieved hemostasis with the cautery. I packed the wound with Burns wet-to-dry Kerlix. Dry gauze, ABDs pads, and mesh panties were applied. The patient appeared to tolerate the procedure. She was then extubated in the operating room and taken in Burns guarded condition to the recovery room. All the counts were correct at the end of the procedure.          [name]   Date: 04/03/2012  Time: 2:09 PM

## 2012-04-03 NOTE — ED Notes (Signed)
Pt elevated troponin. MD aware

## 2012-04-03 NOTE — Anesthesia Preprocedure Evaluation (Addendum)
Anesthesia Evaluation  Patient identified by MRN, date of birth, ID band Patient awake    Reviewed: Allergy & Precautions, H&P , NPO status , Patient's Chart, lab work & pertinent test results  Airway Mallampati: II      Dental  (+) Edentulous Upper and Edentulous Lower   Pulmonary  breath sounds clear to auscultation        Cardiovascular Rhythm:Regular Rate:Tachycardia     Neuro/Psych    GI/Hepatic   Endo/Other    Renal/GU      Musculoskeletal   Abdominal (+) + obese,  Abdomen: soft.    Peds  Hematology   Anesthesia Other Findings   Reproductive/Obstetrics                           Anesthesia Physical Anesthesia Plan  ASA: IV and Emergent  Anesthesia Plan: General   Post-op Pain Management:    Induction: Intravenous  Airway Management Planned: Oral ETT  Additional Equipment: CVP and Arterial line  Intra-op Plan:   Post-operative Plan: Possible Post-op intubation/ventilation  Informed Consent: I have reviewed the patients History and Physical, chart, labs and discussed the procedure including the risks, benefits and alternatives for the proposed anesthesia with the patient or authorized representative who has indicated his/her understanding and acceptance.     Plan Discussed with: CRNA and Surgeon  Anesthesia Plan Comments: (Type 2 DM  Necrotizing fascitis of perineum htn Morbid obesity Poor IV access CAD treated conservatively, chronic chest pain EF 60% by echo 2010   Plan GA with RSI, central line, artline  Roberts Gaudy, MD)       Anesthesia Quick Evaluation

## 2012-04-03 NOTE — Preoperative (Signed)
Beta Blockers   Reason not to administer Beta Blockers:Not Applicable 

## 2012-04-03 NOTE — Transfer of Care (Signed)
Immediate Anesthesia Transfer of Care Note  Patient: [name]  Procedure(s) Performed: Procedure(s) (LRB): IRRIGATION AND DEBRIDEMENT PERIRECTAL ABSCESS (Left)  Patient Location: PACU  Anesthesia Type: General  Level of Consciousness: awake, alert  and oriented  Airway & Oxygen Therapy: Patient Spontanous Breathing and Patient connected to nasal cannula oxygen  Post-op Assessment: Report given to PACU RN  Post vital signs: Reviewed and stable  Complications: No apparent anesthesia complications

## 2012-04-03 NOTE — ED Notes (Signed)
3 rings and 2 earrings removed and placed in black purse front pocket.

## 2012-04-03 NOTE — ED Notes (Signed)
Patient transported to CT 

## 2012-04-03 NOTE — ED Notes (Signed)
Not able to put an IV line in .Paged IV team.

## 2012-04-03 NOTE — ED Notes (Signed)
Notified RN of CBG [id number]

## 2012-04-04 ENCOUNTER — Encounter (HOSPITAL_COMMUNITY): Payer: Self-pay | Admitting: Surgery

## 2012-04-04 DIAGNOSIS — I10 Essential (primary) hypertension: Secondary | ICD-10-CM

## 2012-04-04 DIAGNOSIS — D509 Iron deficiency anemia, unspecified: Secondary | ICD-10-CM

## 2012-04-04 DIAGNOSIS — I214 Non-ST elevation (NSTEMI) myocardial infarction: Secondary | ICD-10-CM | POA: Diagnosis not present

## 2012-04-04 LAB — GLUCOSE, CAPILLARY
Glucose-Capillary: 107 mg/dL — ABNORMAL HIGH (ref 70–99)
Glucose-Capillary: 118 mg/dL — ABNORMAL HIGH (ref 70–99)
Glucose-Capillary: 153 mg/dL — ABNORMAL HIGH (ref 70–99)
Glucose-Capillary: 154 mg/dL — ABNORMAL HIGH (ref 70–99)
Glucose-Capillary: 172 mg/dL — ABNORMAL HIGH (ref 70–99)
Glucose-Capillary: 250 mg/dL — ABNORMAL HIGH (ref 70–99)
Glucose-Capillary: 375 mg/dL — ABNORMAL HIGH (ref 70–99)

## 2012-04-04 LAB — CBC
HCT: 22.9 % — ABNORMAL LOW (ref 36.0–46.0)
Hemoglobin: 7.5 g/dL — ABNORMAL LOW (ref 12.0–15.0)
MCH: 25.3 pg — ABNORMAL LOW (ref 26.0–34.0)
MCHC: 32.8 g/dL (ref 30.0–36.0)
MCV: 77.4 fL — ABNORMAL LOW (ref 78.0–100.0)
RBC: 2.96 MIL/uL — ABNORMAL LOW (ref 3.87–5.11)

## 2012-04-04 LAB — BASIC METABOLIC PANEL
BUN: 8 mg/dL (ref 6–23)
CO2: 22 mEq/L (ref 19–32)
Calcium: 8.2 mg/dL — ABNORMAL LOW (ref 8.4–10.5)
GFR calc non Af Amer: 60 mL/min — ABNORMAL LOW (ref >90)
Glucose, Bld: 122 mg/dL — ABNORMAL HIGH (ref 70–99)

## 2012-04-04 LAB — GC/CHLAMYDIA PROBE AMP, GENITAL: Chlamydia, DNA Probe: NEGATIVE

## 2012-04-04 LAB — TROPONIN I: Troponin I: 2 ng/mL (ref <0.30)

## 2012-04-04 LAB — MAGNESIUM: Magnesium: 1.9 mg/dL (ref 1.5–2.5)

## 2012-04-04 MED ORDER — DEXTROSE 10 % IV SOLN: INTRAVENOUS | Status: DC | PRN

## 2012-04-04 MED ORDER — INSULIN GLARGINE 100 UNIT/ML ~~LOC~~ SOLN
10.0000 [IU] | SUBCUTANEOUS | Status: DC
  Administered 2012-04-04: 10 [IU] via SUBCUTANEOUS

## 2012-04-04 MED ORDER — SODIUM CHLORIDE 0.9 % IV SOLN
INTRAVENOUS | Status: DC
  Administered 2012-04-04: 4.5 [IU]/h via INTRAVENOUS
  Filled 2012-04-04 (×2): qty 1

## 2012-04-04 MED ORDER — POTASSIUM CHLORIDE 10 MEQ/50ML IV SOLN
10.0000 meq | INTRAVENOUS | Status: AC
  Administered 2012-04-04 (×6): 10 meq via INTRAVENOUS
  Filled 2012-04-04: qty 200
  Filled 2012-04-04: qty 100

## 2012-04-04 MED ORDER — ASPIRIN 325 MG PO TABS
325.0000 mg | ORAL_TABLET | Freq: Every day | ORAL | Status: DC
  Administered 2012-04-04 – 2012-04-05 (×2): 325 mg via ORAL
  Filled 2012-04-04 (×2): qty 1

## 2012-04-04 MED ORDER — INSULIN ASPART 100 UNIT/ML ~~LOC~~ SOLN: 0.0000 [IU] | SUBCUTANEOUS | Status: DC

## 2012-04-04 MED ORDER — ISOSORBIDE MONONITRATE ER 30 MG PO TB24
30.0000 mg | ORAL_TABLET | Freq: Every day | ORAL | Status: DC
  Administered 2012-04-04 – 2012-04-05 (×2): 30 mg via ORAL
  Filled 2012-04-04 (×2): qty 1

## 2012-04-04 MED ORDER — INSULIN ASPART 100 UNIT/ML ~~LOC~~ SOLN
0.0000 [IU] | SUBCUTANEOUS | Status: DC
  Administered 2012-04-04: 1 [IU] via SUBCUTANEOUS
  Administered 2012-04-04 (×2): 4 [IU] via SUBCUTANEOUS
  Administered 2012-04-04: 1 [IU] via SUBCUTANEOUS

## 2012-04-04 MED ORDER — RANOLAZINE ER 500 MG PO TB12
500.0000 mg | ORAL_TABLET | Freq: Two times a day (BID) | ORAL | Status: DC
  Administered 2012-04-04 – 2012-04-05 (×2): 500 mg via ORAL
  Filled 2012-04-04 (×3): qty 1

## 2012-04-04 NOTE — Progress Notes (Signed)
Dressing changed and doesn't appear to need further debridement.  Will need BID dressing changes but she tolerated the wound care at the bedside.  Troponin elevated and other medical issues.  Appreciate CCM

## 2012-04-04 NOTE — Progress Notes (Signed)
Pt placed on CPAP via Full face mask. On auto titrate. Max setting 20.0 min setting 7.0. Pt tolerating well at this time.   HR 96 RR 19 O2 sat 94    RT will continue to monitor.

## 2012-04-04 NOTE — Care Management Note (Addendum)
    Page 1 of 2   04/13/2012     11:18:45 AM   CARE MANAGEMENT NOTE 04/13/2012  Patient:  [name], [name]   Account Number:  [id number]  Date Initiated:  04/04/2012  Documentation initiated by:  AMERSON,JULIE  Subjective/Objective Assessment:   PT S/P I&D OF PERIRECTAL ABSCESS  AND NSTEMI ON 9/2.  PTA, PT INDEPENDENT, LIVES WITH SPOUSE.     Action/Plan:   MET WITH PT TO DISCUSS DC PLANS.  PT STATES HUSBAND WORKS, AND IS CURRENTLY OUT OF TOWN ON BUSINESS.  SHE STATES SHE HAS NO OTHER SUPPORT, BUT FEELS SHE WILL BE ABLE TO CARE FOR HERSELF AT DC.  WILL FOLLOW FOR HOME NEEDS AS PT PROGRESSES.   Anticipated DC Date:  04/13/2012   Anticipated DC Plan:  SKILLED NURSING FACILITY  In-house referral  Clinical Social Worker      DC Planning Services  CM consult      Choice offered to / List presented to:  C-1 Patient        [facility] [facility] arranged  HH-1 RN  [place] [facility]      [place].   Status of service:  Completed, signed off Medicare Important Message given?   (If response is "NO", the following Medicare IM given date fields will be blank) Date Medicare IM given:   Date Additional Medicare IM given:    Discharge Disposition:    Per UR Regulation:  Reviewed for med. necessity/level of care/duration of stay  If discussed at [place] of Stay Meetings, dates discussed:   04/11/2012    Comments:    04-13-12 Referral for  GLUCOMETER . Patient discharging to SNF . SNF will provide  GLUCOMETER .Magdalen Spatz RN BSN [phone]  04/11/12 12:55 Call from Scott with [facility], pt is not LTAC appropriate. Georgana Curio RN CCM [facility]  04-11-12 referral made to [place] RN BSN [phone]   04/07/12 0900 Henrietta Mayo RN MSN CCM Provided pt with contact information for Dr. Nolene Ebbs with [facility], as he is accepting medicare pts. Pt has vision problems but will provide information to spouse who will arrange appt.  Home health RN and SW  will follow pt upon d/c.  04/06/12 [place] RN MSN CCM Pt will need home health RN for wound care.  Provided list of [facility], referral made per choice.  Per pt, spouse is not working, has applied for [place] disability, and they do not have power 2/2 > $3,000 bill, use a generator several hours a day.  Home health SW added to assist with DSS crisis intervention application.  PCP was with [facility], pt requests assist with finding new PCP that will accept Medicare.  TC to [facility], message left requesting return call.

## 2012-04-04 NOTE — Consult Note (Signed)
Name: [name] MRN: [id number] DOB: [birth year]    LOS: 1  Referring Provider:  [facility]S Dr Ninfa Linden Reason for Referral:  Resp Distress  PULMONARY / CRITICAL CARE MEDICINE   Brief patient description:   56 yo female admited 9/2 for gangrenous left buttock /perineal abscess s/p debridement in OR 9/2. CCM asked to assist with comorbid medical illnesses.  Events Since Admission: 9/2//13:Found to have high WBC and a CT scan showing finding consistent with gas gangrene of the perineum and left buttock.  S/P INCISION, DRAINAGE, AND SHARP DEBRIDEMENT LEFT BUTTOCK/PERINEAL ABSCESS   Current Status: Feels better this morning but weak; denies chest pain  Vital Signs: Temp:  [97.2 F (36.2 C)-100.7 F (38.2 C)] 99.6 F (37.6 C) (09/03 0743) Pulse Rate:  [82-105] 82  (09/03 0800) Resp:  [14-25] 19  (09/03 0800) BP: (118-158)/(50-108) 126/61 mmHg (09/03 0800) SpO2:  [89 %-100 %] 98 % (09/03 0800) Arterial Line BP: (65-240)/(53-118) 119/62 mmHg (09/03 0800) Weight:  [121.564 kg (268 lb)] 121.564 kg (268 lb) (09/02 1700)  Physical Examination: Gen: comfortable, no acute distress HEENT: NCAT, PERRL PULM: CTA B CV: RRR, no mgr AB: BS+, soft, nontender Ext: warm, trace edema Neuro: A&Ox4, maew    Principal Problem:  *Sepsis Active Problems:  ANEMIA, IRON DEFICIENCY  Diabetes mellitus  Perirectal abscess   ASSESSMENT AND PLAN  PULMONARY  Lab 04/03/12 1355 04/03/12 0751  PHART 7.355 --  PCO2ART 39.2 --  PO2ART 297.0* --  HCO3 21.9 22.7  O2SAT 100.0 70.0   Ventilator Settings: n/a     A:  Extubated post op uneventfully. Normal CXR .  Likely OSA, did not tolerate Autotitration CPAP in hospital overnight 9/2 P:   Maintain sats Needs Sleep study as outpatient  CARDIOVASCULAR   Lab 04/04/12 0415 04/03/12 1729 04/03/12 0737  TROPONINI 2.00* 0.45* 0.35*  LATICACIDVEN -- -- --  PROBNP -- -- --   A:  CAD HTN 9/2 NSTEMI, chest pain free; likely Class II MI P:    -start ASA now -continue home b-blocker, hydralazine, HCTZ -consult cardiology 9/3 for NSTEMI, further med adjustment per them   RENAL  Lab 04/04/12 0415 04/03/12 1729 04/03/12 1355 04/03/12 0740  NA 133* -- 134* 130*  K 3.2* -- 3.6 --  CL 102 -- -- 93*  CO2 22 -- -- 22  BUN 8 -- -- 15  CREATININE 1.02 -- -- 1.26*  CALCIUM 8.2* -- -- 9.1  MG 1.9 2.0 -- --  PHOS -- 1.9* -- --   Intake/Output      09/02 0701 - 09/03 0700 09/03 0701 - 09/04 0700   I.V. (mL/kg) 1702.5 (14) 151.4 (1.2)   IV Piggyback 260 100   Total Intake(mL/kg) 1962.5 (16.1) 251.4 (2.1)   Urine (mL/kg/hr) 2226 (0.8) 75   Blood 50    Total Output 2276 75   Net -313.5 +176.4         Foley:    A:  AKI on admission resolved Hypokalemia  P:   Replete K and monitor  GASTROINTESTINAL No results found for this basename: AST:5,ALT:5,ALKPHOS:5,BILITOT:5,PROT:5,ALBUMIN:5 in the last 168 hours  A:  No acute issues P:   Advance diet per GI surgery   HEMATOLOGIC  Lab 04/04/12 0415 04/03/12 1355 04/03/12 0944 04/03/12 0740  HGB 7.5* 7.5* -- 8.0*  HCT 22.9* 22.0* -- 24.2*  PLT 321 -- -- 342  INR -- -- 1.71* --  APTT -- -- 35 --   A:  Anemia of ? Cause (hgb  11.7 in June 2013) and admit 8gm% P:  Monitor cbc PRBC for hgb < 7gm% Given drop pre surgery, will need outpatient anemia work up by PCP after disharge   INFECTIOUS  Lab 04/04/12 0415 04/03/12 1730 04/03/12 0740  WBC 21.5* -- 24.8*  PROCALCITON -- 0.19 --   Cultures: 9/2 abscess culture>> 9/2 wet prep >> clue cells 9/2 blood culture >> 9/2 procalcitonin >>0.19  Antibiotics: 9/2 vanc >> 9/2 Zosyn >> 9/2 clinda >>   A:  Gangrenous perineal abscess, s/p debridement; improving P:   F/u cultures Antibiotics per CCS  ENDOCRINE  Lab 04/03/12 2154 04/03/12 2102 04/03/12 2000 04/03/12 1911 04/03/12 1814  GLUCAP 146* 176* 176* 221* 231*   A:    Hx of Insulin depend DM ; now improved control on insulin gtt  P:   Change to lantus and  correction coverage now Will need to change back to home regimen prior to discharge  NEUROLOGIC  A:  Pain post op mild P:   Per ccs   BEST PRACTICE / DISPOSITION Level of Care:  ICU --> could be transferred to SDU after off insulin gtt and cardiology consult Primary Service:  CCS  Consultants:  pccm Code Status:  full Diet:  npo DVT Px:  scd GI Px: monuitor Skin Integrity:   Social / Family:  None at bedside  Jillyn Hidden PCCM Pager: [phone] Cell: [phone] If no response, call [phone]

## 2012-04-04 NOTE — Progress Notes (Signed)
Pt stated she did not tolerate mask well the previous night and she does not wish to be placed back on tonight. Pt does not wear CPAP at home. Pt encouraged to call RT if pt changes mind. No distress noted.

## 2012-04-04 NOTE — Consult Note (Signed)
Reason for Consult: NSTEMI Referring Physician:  Cardiologist: Al Little  [name] is an 56 y.o. female.  HPI:   Her cardiac cath in July of 2010 revealed diffuse right coronary, circumflex and first diagonal disease. She had a TCTS consult with Dr. Caffie Pinto and he did not feel she would be amenable to surgery due to poor targets. Since that time she has been managed medically. 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   She was last seen in the office on Mar 23, 2012.  She presented yesterday with sepsis secondary to gas gangrene and abscess on her left buttock.  The patient states she remembers scratching herself on Friday after using the toilet.  She developed CP on Saturday, tightness, 10/10, with radiation to the scapulae and left arm.  Sharp neck pain.   She also complained of N, V, diarrhea, orthopnea, pnd, HA, gums being numb, elevated HR, dizziness, tight abdomen, LEE.   She was taken to the OR emergently for exploration, debridement, irrigation, drainage.  Currently she is pain free.  Troponin is 2.00.  EKG shows anterolateral ST depression ~0.56mm.  HR of 83.   Past Medical History  Diagnosis Date  . Diabetes mellitus   . Hypertension   . Hyperlipemia   . Coronary artery disease   . Headache   . Chest pain at rest 01/15/2012  . Nausea and vomiting, secondary to viral syndrome 01/17/2012    Past Surgical History  Procedure Date  . Cardiac catheterization     No family history on file.  Social History:  reports that she has never smoked. She does not have any smokeless tobacco history on file. She reports that she does not drink alcohol or use illicit drugs.  Allergies:  Allergies  Allergen  Reactions  . Lorazepam Anaphylaxis    Medications:     . aspirin  325 mg Oral Daily  . atropine  1 drop Right Eye BID  . clindamycin (CLEOCIN) IV  300 mg Intravenous Q8H  . diltiazem  240 mg Oral Daily  . heparin  5,000 Units Subcutaneous Q8H  . hydrALAZINE  50 mg Oral BID  . losartan  100 mg Oral Daily   And  . hydrochlorothiazide  25 mg Oral Daily  . insulin aspart  0-4 Units Subcutaneous Q4H  . insulin aspart  5 Units Intravenous Once  . insulin glargine  10 Units Subcutaneous Q24H  . insulin (NOVOLIN-R) infusion   Intravenous To OR  . metoprolol  200 mg Oral Daily  . morphine   Intravenous Q4H  . pantoprazole (PROTONIX) IV  40 mg Intravenous Q24H  . piperacillin-tazobactam (ZOSYN)  IV  3.375 g Intravenous Once  . piperacillin-tazobactam (ZOSYN)  IV  3.375 g Intravenous Q8H  . potassium chloride  10 mEq Intravenous Q1 Hr x 6  . vancomycin  2,000 mg Intravenous To ER  . DISCONTD: clindamycin (CLEOCIN) IV  600 mg Intravenous Once  . DISCONTD: insulin aspart  0-20 Units Subcutaneous Q4H  . DISCONTD: insulin aspart  0-4 Units Subcutaneous Q4H  . DISCONTD: losartan-hydrochlorothiazide  1 tablet Oral Daily     Results for orders placed during the hospital encounter of 04/03/12 (from the past 48 hour(s))  GLUCOSE, CAPILLARY     Status: Abnormal   Collection  Time   04/03/12  6:58 AM      Component Value Range Comment   Glucose-Capillary 316 (*) 70 - 99 mg/dL   TROPONIN I     Status: Abnormal   Collection Time   04/03/12  7:37 AM      Component Value Range Comment   Troponin I 0.35 (*) <0.30 ng/mL   BASIC METABOLIC PANEL     Status: Abnormal   Collection Time   04/03/12  7:40 AM      Component Value Range Comment   Sodium 130 (*) 135 - 145 mEq/L    Potassium 3.4 (*) 3.5 - 5.1 mEq/L    Chloride 93 (*) 96 - 112 mEq/L    CO2 22  19 - 32 mEq/L    Glucose, Bld 361 (*) 70 - 99 mg/dL    BUN 15  6 - 23 mg/dL    Creatinine, Ser 1.26 (*) 0.50 - 1.10 mg/dL    Calcium 9.1  8.4 - 10.5  mg/dL    GFR calc non Af Amer 46 (*) >90 mL/min    GFR calc Af Amer 54 (*) >90 mL/min   CBC     Status: Abnormal   Collection Time   04/03/12  7:40 AM      Component Value Range Comment   WBC 24.8 (*) 4.0 - 10.5 K/uL    RBC 3.17 (*) 3.87 - 5.11 MIL/uL    Hemoglobin 8.0 (*) 12.0 - 15.0 g/dL    HCT 24.2 (*) 36.0 - 46.0 %    MCV 76.3 (*) 78.0 - 100.0 fL    MCH 25.2 (*) 26.0 - 34.0 pg    MCHC 33.1  30.0 - 36.0 g/dL    RDW 14.8  11.5 - 15.5 %    Platelets 342  150 - 400 K/uL   POCT I-STAT 3, BLOOD GAS (G3P V)     Status: Abnormal   Collection Time   04/03/12  7:51 AM      Component Value Range Comment   pH, Ven 7.446 (*) 7.250 - 7.300    pCO2, Ven 32.9 (*) 45.0 - 50.0 mmHg    pO2, Ven 34.0  30.0 - 45.0 mmHg    Bicarbonate 22.7  20.0 - 24.0 mEq/L    TCO2 24  0 - 100 mmol/L    O2 Saturation 70.0      Acid-base deficit 1.0  0.0 - 2.0 mmol/L    Sample type VENOUS      Comment NOTIFIED PHYSICIAN     URINALYSIS, ROUTINE W REFLEX MICROSCOPIC     Status: Abnormal   Collection Time   04/03/12  8:57 AM      Component Value Range Comment   Color, Urine YELLOW  YELLOW    APPearance CLOUDY (*) CLEAR    Specific Gravity, Urine 1.024  1.005 - 1.030    pH 5.5  5.0 - 8.0    Glucose, UA >1000 (*) NEGATIVE mg/dL    Hgb urine dipstick NEGATIVE  NEGATIVE    Bilirubin Urine NEGATIVE  NEGATIVE    Ketones, ur 40 (*) NEGATIVE mg/dL    Protein, ur 30 (*) NEGATIVE mg/dL    Urobilinogen, UA 1.0  0.0 - 1.0 mg/dL    Nitrite NEGATIVE  NEGATIVE    Leukocytes, UA NEGATIVE  NEGATIVE   URINE MICROSCOPIC-ADD ON     Status: Abnormal   Collection Time   04/03/12  8:57 AM      Component Value Range Comment  Squamous Epithelial / LPF FEW (*) RARE    WBC, UA 0-2  <3 WBC/hpf    RBC / HPF 0-2  <3 RBC/hpf    Bacteria, UA FEW (*) RARE    Casts GRANULAR CAST (*) NEGATIVE   OCCULT BLOOD, POC DEVICE     Status: Normal   Collection Time   04/03/12  9:16 AM      Component Value Range Comment   Fecal Occult Bld POSITIVE      PROTIME-INR     Status: Abnormal   Collection Time   04/03/12  9:44 AM      Component Value Range Comment   Prothrombin Time 20.4 (*) 11.6 - 15.2 seconds    INR 1.71 (*) 0.00 - 1.49   APTT     Status: Normal   Collection Time   04/03/12  9:44 AM      Component Value Range Comment   aPTT 35  24 - 37 seconds   TYPE AND SCREEN     Status: Normal   Collection Time   04/03/12  9:49 AM      Component Value Range Comment   ABO/RH(D) B POS      Antibody Screen NEG      Sample Expiration 04/06/2012     WET PREP, GENITAL     Status: Abnormal   Collection Time   04/03/12  9:51 AM      Component Value Range Comment   Yeast Wet Prep HPF POC NONE SEEN  NONE SEEN    Trich, Wet Prep NONE SEEN  NONE SEEN    Clue Cells Wet Prep HPF POC MODERATE (*) NONE SEEN    WBC, Wet Prep HPF POC FEW (*) NONE SEEN   GLUCOSE, CAPILLARY     Status: Abnormal   Collection Time   04/03/12 10:25 AM      Component Value Range Comment   Glucose-Capillary 339 (*) 70 - 99 mg/dL    Comment 1 Notify RN     CULTURE, BLOOD (ROUTINE X 2)     Status: Normal (Preliminary result)   Collection Time   04/03/12 10:33 AM      Component Value Range Comment   Specimen Description BLOOD LEFT ARM      Special Requests BOTTLES DRAWN AEROBIC AND ANAEROBIC 10CC      Culture  Setup Time 04/03/2012 19:05      Culture        Value:        BLOOD CULTURE RECEIVED NO GROWTH TO DATE CULTURE WILL BE HELD FOR 5 DAYS BEFORE ISSUING A FINAL NEGATIVE REPORT   Report Status PENDING     CULTURE, BLOOD (ROUTINE X 2)     Status: Normal (Preliminary result)   Collection Time   04/03/12 10:45 AM      Component Value Range Comment   Specimen Description BLOOD LEFT HAND      Special Requests BOTTLES DRAWN AEROBIC AND ANAEROBIC 5CC      Culture  Setup Time 04/03/2012 19:06      Culture        Value:        BLOOD CULTURE RECEIVED NO GROWTH TO DATE CULTURE WILL BE HELD FOR 5 DAYS BEFORE ISSUING A FINAL NEGATIVE REPORT   Report Status PENDING     POCT I-STAT  7, (LYTES, BLD GAS, ICA,H+H)     Status: Abnormal   Collection Time   04/03/12  1:55 PM      Component Value  Range Comment   pH, Arterial 7.355  7.350 - 7.450    pCO2 arterial 39.2  35.0 - 45.0 mmHg    pO2, Arterial 297.0 (*) 80.0 - 100.0 mmHg    Bicarbonate 21.9  20.0 - 24.0 mEq/L    TCO2 23  0 - 100 mmol/L    O2 Saturation 100.0      Acid-base deficit 3.0 (*) 0.0 - 2.0 mmol/L    Sodium 134 (*) 135 - 145 mEq/L    Potassium 3.6  3.5 - 5.1 mEq/L    Calcium, Ion 1.10 (*) 1.12 - 1.23 mmol/L    HCT 22.0 (*) 36.0 - 46.0 %    Hemoglobin 7.5 (*) 12.0 - 15.0 g/dL    Patient temperature 37.0 C      Sample type ARTERIAL     CULTURE, ROUTINE-ABSCESS     Status: Normal (Preliminary result)   Collection Time   04/03/12  2:01 PM      Component Value Range Comment   Specimen Description ABSCESS PERIRECTAL      Special Requests PATIENT ON FOLLOWING VANCOMYCIN,ZOSYN,CLEOCIN      Gram Stain PENDING      Culture NO GROWTH 1 DAY      Report Status PENDING     GLUCOSE, CAPILLARY     Status: Abnormal   Collection Time   04/03/12  2:50 PM      Component Value Range Comment   Glucose-Capillary 375 (*) 70 - 99 mg/dL   GLUCOSE, CAPILLARY     Status: Abnormal   Collection Time   04/03/12  4:10 PM      Component Value Range Comment   Glucose-Capillary 351 (*) 70 - 99 mg/dL   GLUCOSE, CAPILLARY     Status: Abnormal   Collection Time   04/03/12  5:11 PM      Component Value Range Comment   Glucose-Capillary 295 (*) 70 - 99 mg/dL   TROPONIN I     Status: Abnormal   Collection Time   04/03/12  5:29 PM      Component Value Range Comment   Troponin I 0.45 (*) <0.30 ng/mL   PHOSPHORUS     Status: Abnormal   Collection Time   04/03/12  5:29 PM      Component Value Range Comment   Phosphorus 1.9 (*) 2.3 - 4.6 mg/dL   MAGNESIUM     Status: Normal   Collection Time   04/03/12  5:29 PM      Component Value Range Comment   Magnesium 2.0  1.5 - 2.5 mg/dL   PROCALCITONIN     Status: Normal   Collection Time   04/03/12   5:30 PM      Component Value Range Comment   Procalcitonin 0.19     GLUCOSE, CAPILLARY     Status: Abnormal   Collection Time   04/03/12  6:14 PM      Component Value Range Comment   Glucose-Capillary 231 (*) 70 - 99 mg/dL   MRSA PCR SCREENING     Status: Normal   Collection Time   04/03/12  6:28 PM      Component Value Range Comment   MRSA by PCR NEGATIVE  NEGATIVE   GLUCOSE, CAPILLARY     Status: Abnormal   Collection Time   04/03/12  7:11 PM      Component Value Range Comment   Glucose-Capillary 221 (*) 70 - 99 mg/dL   GLUCOSE, CAPILLARY     Status: Abnormal  Collection Time   04/03/12  8:00 PM      Component Value Range Comment   Glucose-Capillary 176 (*) 70 - 99 mg/dL    Comment 1 Documented in Chart      Comment 2 Notify RN     GLUCOSE, CAPILLARY     Status: Abnormal   Collection Time   04/03/12  9:02 PM      Component Value Range Comment   Glucose-Capillary 176 (*) 70 - 99 mg/dL   GLUCOSE, CAPILLARY     Status: Abnormal   Collection Time   04/03/12  9:54 PM      Component Value Range Comment   Glucose-Capillary 146 (*) 70 - 99 mg/dL    Comment 1 Documented in Chart      Comment 2 Notify RN     CBC     Status: Abnormal   Collection Time   04/04/12  4:15 AM      Component Value Range Comment   WBC 21.5 (*) 4.0 - 10.5 K/uL    RBC 2.96 (*) 3.87 - 5.11 MIL/uL    Hemoglobin 7.5 (*) 12.0 - 15.0 g/dL    HCT 22.9 (*) 36.0 - 46.0 %    MCV 77.4 (*) 78.0 - 100.0 fL    MCH 25.3 (*) 26.0 - 34.0 pg    MCHC 32.8  30.0 - 36.0 g/dL    RDW 14.8  11.5 - 15.5 %    Platelets 321  150 - 400 K/uL   BASIC METABOLIC PANEL     Status: Abnormal   Collection Time   04/04/12  4:15 AM      Component Value Range Comment   Sodium 133 (*) 135 - 145 mEq/L    Potassium 3.2 (*) 3.5 - 5.1 mEq/L    Chloride 102  96 - 112 mEq/L DELTA CHECK NOTED   CO2 22  19 - 32 mEq/L    Glucose, Bld 122 (*) 70 - 99 mg/dL    BUN 8  6 - 23 mg/dL    Creatinine, Ser 1.02  0.50 - 1.10 mg/dL    Calcium 8.2 (*) 8.4 - 10.5  mg/dL    GFR calc non Af Amer 60 (*) >90 mL/min    GFR calc Af Amer 69 (*) >90 mL/min   TROPONIN I     Status: Abnormal   Collection Time   04/04/12  4:15 AM      Component Value Range Comment   Troponin I 2.00 (*) <0.30 ng/mL   MAGNESIUM     Status: Normal   Collection Time   04/04/12  4:15 AM      Component Value Range Comment   Magnesium 1.9  1.5 - 2.5 mg/dL     Ct Abdomen Pelvis Wo Contrast  04/03/2012  *RADIOLOGY REPORT*  Clinical Data: Diabetic with left buttock abscess.  CT ABDOMEN AND PELVIS WITHOUT CONTRAST  Technique:  Multidetector CT imaging of the abdomen and pelvis was performed following the standard protocol without intravenous contrast.  Comparison: None.  Findings: Gas-forming infectious process is identified in the subcutaneous fat of the left medial buttocks.  Gas extends deep into the subcutaneous fat and approaches the perineum.  There is no evidence of extension into the peritoneal cavity.  Soft tissue gas and surrounding inflammatory changes are seen without evidence of a significant component of fluid or focal drainable abscess.  The liver, gallbladder, pancreas, spleen, adrenal glands and kidneys are unremarkable by unenhanced CT.  Bowel loops are  normal caliber.  The bladder is unremarkable.  Uterus and adnexal regions are unremarkable by CT.  IMPRESSION: Gas-forming infectious process in the subcutaneous fat of the medial left buttocks.  Gas approaches the perineum.  No evidence of focal fluid collection or drainable abscess by CT.   Original Report Authenticated By: Azzie Roup, M.D.    Dg Chest Port 1 View  04/03/2012  *RADIOLOGY REPORT*  Clinical Data: Central venous catheter placement  PORTABLE CHEST - 1 VIEW  Comparison: 04/03/2012  Findings: There is a left subclavian catheter with tip in the projection of the SVC.  No pneumothorax identified.  Heart size appears enlarged.  There is no pleural effusion or edema identified.  No airspace consolidation identified.   IMPRESSION:  1.  No pneumothorax after catheter placement.   Original Report Authenticated By: Angelita Ingles, M.D.    Dg Chest Portable 1 View  04/03/2012  *RADIOLOGY REPORT*  Clinical Data: Shortness of breath.  Chest pain.  Nausea vomiting and fever.  Elevated blood pressure.  Hypertension.  Diabetes.  PORTABLE CHEST - 1 VIEW  Comparison: 01/15/2012  Findings: Midline trachea.  Mild cardiomegaly. No pleural effusion or pneumothorax.  Mildly low lung volumes with resultant interstitial prominence.  Patchy areas of subsegmental atelectasis at the bases.  IMPRESSION: Cardiomegaly and lung volumes. No acute findings.   Original Report Authenticated By: Areta Haber, M.D.     Review of Systems  Constitutional: Positive for diaphoresis.  HENT: Positive for neck pain (Sharp, anterior, bilateral).   Eyes:       Blind in right eye.  Respiratory: Positive for shortness of breath. Negative for cough.   Cardiovascular: Positive for chest pain, palpitations, orthopnea, leg swelling and PND.  Gastrointestinal: Positive for nausea, vomiting, abdominal pain and diarrhea.  Neurological: Positive for dizziness and headaches.   Blood pressure 125/58, pulse 81, temperature 99.6 F (37.6 C), temperature source Oral, resp. rate 15, height 5\' 3"  (1.6 m), weight 121.564 kg (268 lb), SpO2 98.00%. Physical Exam  Constitutional: She is oriented to person, place, and time. No distress.       Morbidly obese, deconditioned AA female.  HENT:  Head: Normocephalic and atraumatic.  Eyes: EOM are normal. No scleral icterus.  Neck: Normal range of motion.  Cardiovascular: Normal rate, regular rhythm, S1 normal and S2 normal.   No murmur heard. Pulses:      Radial pulses are 1+ on the right side, and 1+ on the left side.       Posterior tibial pulses are 1+ on the right side, and 1+ on the left side.       Heart sounds were distant.  Respiratory: Effort normal and breath sounds normal. She has no wheezes. She has no  rales.  GI: Soft. Bowel sounds are normal. There is no tenderness.  Musculoskeletal: She exhibits no edema.  Lymphadenopathy:    She has no cervical adenopathy.  Neurological: She is alert and oriented to person, place, and time.  Skin: Skin is warm and dry.  Psychiatric: She has a normal mood and affect.    Assessment/Plan: Patient Active Hospital Problem List: Sepsis (04/03/2012) ANEMIA, IRON DEFICIENCY (04/17/2007) CAD (02/27/2009) Obesity, morbid (01/17/2012) Diabetes mellitus (04/03/2012) Perirectal abscess (04/03/2012) NSTEMI (non-ST elevated myocardial infarction) (04/04/2012)  Plan:  Continue to monitor Troponin and EKG.  She is currently CP free with a Hgb of 7.5.  CP may have been related to demand ischemia secondary to sepsis/tachycardia and know CAD.  Given current situation, she  will not be taken to the cath lab anytime soon.  There may be little that can be intervened on based on the last cath report from 2010.  Poor targets and diffuse disease.  MD to follow.   HAGER, BRYAN 04/04/2012, 11:26 AM    Patient seen and examined. Agree with assessment and plan. 56 yo AAF former pt of Dr. Rex Kras who has known CAD felt not amenable to CABG revascularization due to poor targets, HTN,  DM with retinopathy admitted with sepsis/perirectal abscess. She had deveoloped CP prior to admission and subsequently has ruled in for NSTEMI. ECG has shown mild additional T changes inferolaterrally.  She is anemic and currently getting PRBC Tx.  Currently pain free with stable hemodynamics. She is on fairly maximum beta blocker at 200 mg daily of metoprolol. Will emperically add nitrates. May also benefit from ranolazine with medical management of CAD and poor distal targets.   Troy Sine, MD, [facility] 04/04/2012 4:36 PM

## 2012-04-04 NOTE — Progress Notes (Signed)
Inpatient Diabetes Program Recommendations  AACE/ADA: New Consensus Statement on Inpatient Glycemic Control (2013)  Target Ranges:  Prepandial:   less than 140 mg/dL      Peak postprandial:   less than 180 mg/dL (1-2 hours)      Critically ill patients:  140 - 180 mg/dL   Reason for Visit: Note patient transitioned off insulin drip this morning.  According to medication reconciliation, patient was on Humalog 75/25 65 units bid.  Patient currently on Lantus 10 units qq 24 hours and ICU Hyperglycemia protocol correction (1-3-4) q 4 hours.     Based on home insulin doses, may need more Lantus.  Will follow.  Also please check A1C to determine home glycemic control. Since patient is now on clear liquid diet, may consider changing to "Glycemic Control" order set, moderate correction q 4 hours.       Note: Note patient does not have glucose meter, will need Rx. At discharge for new meter/strips.  Will follow.

## 2012-04-04 NOTE — Progress Notes (Signed)
1 Day Post-Op  Subjective: She appears comfortable this morning, no new c/o pain being managed by PCA.   Objective: Vital signs in last 24 hours: Temp:  [97.2 F (36.2 C)-100.7 F (38.2 C)] 99.6 F (37.6 C) (09/03 0743) Pulse Rate:  [82-105] 82  (09/03 0700) Resp:  [14-25] 16  (09/03 0700) BP: (118-158)/(50-108) 125/52 mmHg (09/03 0700) SpO2:  [89 %-100 %] 97 % (09/03 0700) Arterial Line BP: (65-240)/(53-118) 162/61 mmHg (09/03 0700) Weight:  [268 lb (121.564 kg)] 268 lb (121.564 kg) (09/02 1700) Last BM Date: 04/02/12  Intake/Output from previous day: 09/02 0701 - 09/03 0700 In: 1962.5 [I.V.:1702.5; IV Piggyback:260] Out: 2276 [Urine:2226; Blood:50] Intake/Output this shift:    General appearance: alert, cooperative, appears stated age, no distress and morbidly obese Chest: CTA bilaterally Cardiac: RRR Left Buttock wound: Some purulent drainage on exterior abd pad, packing was removed and this too contained foul smelling drainage. The wound itself is w/o bleeding, tissue appears well perfused. The wound was repacked with wet to dry kerlex gauze.  WBC remains elevated (21.5) but is trending downward, she continues with low grade temp at 99.6 which is also trending downward. Hgb/HCT remains low but stable at 7.5/22.0.   Lab Results:   Basename 04/04/12 0415 04/03/12 1355 04/03/12 0740  WBC 21.5* -- 24.8*  HGB 7.5* 7.5* --  HCT 22.9* 22.0* --  PLT 321 -- 342   BMET  Basename 04/04/12 0415 04/03/12 1355 04/03/12 0740  NA 133* 134* --  K 3.2* 3.6 --  CL 102 -- 93*  CO2 22 -- 22  GLUCOSE 122* -- 361*  BUN 8 -- 15  CREATININE 1.02 -- 1.26*  CALCIUM 8.2* -- 9.1   PT/INR  Basename 04/03/12 0944  LABPROT 20.4*  INR 1.71*   ABG  Basename 04/03/12 1355 04/03/12 0751  PHART 7.355 --  HCO3 21.9 22.7    Studies/Results: Ct Abdomen Pelvis Wo Contrast  04/03/2012  *RADIOLOGY REPORT*  Clinical Data: Diabetic with left buttock abscess.  CT ABDOMEN AND PELVIS WITHOUT  CONTRAST  Technique:  Multidetector CT imaging of the abdomen and pelvis was performed following the standard protocol without intravenous contrast.  Comparison: None.  Findings: Gas-forming infectious process is identified in the subcutaneous fat of the left medial buttocks.  Gas extends deep into the subcutaneous fat and approaches the perineum.  There is no evidence of extension into the peritoneal cavity.  Soft tissue gas and surrounding inflammatory changes are seen without evidence of a significant component of fluid or focal drainable abscess.  The liver, gallbladder, pancreas, spleen, adrenal glands and kidneys are unremarkable by unenhanced CT.  Bowel loops are normal caliber.  The bladder is unremarkable.  Uterus and adnexal regions are unremarkable by CT.  IMPRESSION: Gas-forming infectious process in the subcutaneous fat of the medial left buttocks.  Gas approaches the perineum.  No evidence of focal fluid collection or drainable abscess by CT.   Original Report Authenticated By: Azzie Roup, M.D.    Dg Chest Port 1 View  04/03/2012  *RADIOLOGY REPORT*  Clinical Data: Central venous catheter placement  PORTABLE CHEST - 1 VIEW  Comparison: 04/03/2012  Findings: There is a left subclavian catheter with tip in the projection of the SVC.  No pneumothorax identified.  Heart size appears enlarged.  There is no pleural effusion or edema identified.  No airspace consolidation identified.  IMPRESSION:  1.  No pneumothorax after catheter placement.   Original Report Authenticated By: Angelita Ingles, M.D.  Dg Chest Portable 1 View  04/03/2012  *RADIOLOGY REPORT*  Clinical Data: Shortness of breath.  Chest pain.  Nausea vomiting and fever.  Elevated blood pressure.  Hypertension.  Diabetes.  PORTABLE CHEST - 1 VIEW  Comparison: 01/15/2012  Findings: Midline trachea.  Mild cardiomegaly. No pleural effusion or pneumothorax.  Mildly low lung volumes with resultant interstitial prominence.  Patchy areas of  subsegmental atelectasis at the bases.  IMPRESSION: Cardiomegaly and lung volumes. No acute findings.   Original Report Authenticated By: Areta Haber, M.D.     Anti-infectives: Anti-infectives     Start     Dose/Rate Route Frequency Ordered Stop   04/03/12 2200  piperacillin-tazobactam (ZOSYN) IVPB 3.375 g       3.375 g 12.5 mL/hr over 240 Minutes Intravenous 3 times per day 04/03/12 1712     04/03/12 2000   clindamycin (CLEOCIN) IVPB 300 mg     Comments: Pharmacy may adjust dose      300 mg 100 mL/hr over 30 Minutes Intravenous Every 8 hours 04/03/12 1654     04/03/12 1200   clindamycin (CLEOCIN) IVPB 600 mg  Status:  Discontinued        600 mg 100 mL/hr over 30 Minutes Intravenous  Once 04/03/12 1152 04/03/12 1716   04/03/12 1200  piperacillin-tazobactam (ZOSYN) IVPB 3.375 g       3.375 g 12.5 mL/hr over 240 Minutes Intravenous  Once 04/03/12 1152     04/03/12 1100   vancomycin (VANCOCIN) 2,000 mg in sodium chloride 0.9 % 500 mL IVPB        2,000 mg 250 mL/hr over 120 Minutes Intravenous To Emergency Dept 04/03/12 0957 04/03/12 1334          Assessment/Plan:  Patient Active Problem List  Diagnosis  . DIABETES MELLITUS, TYPE II, ON INSULIN, UNCONTROLLED  . ANEMIA, IRON DEFICIENCY  . Proliferative diabetic retinopathy  . VISUAL CHANGES  . HYPERTENSION, BENIGN ESSENTIAL  . CAD  . GERD  . MENORRHAGIA, PERIMENOPAUSAL  . SHOULDER PAIN, LEFT  . DIZZINESS  . PERIPHERAL EDEMA  . DETACHED RETINA, BILATERAL, HX OF  . Chest pain at rest, secondary to viral syndrome.  negative MI  . Obesity, morbid  . Nausea and vomiting, secondary to viral syndrome  . Diabetes mellitus  . Sepsis  . Perirectal abscess    s/p Procedure(s) (LRB): IRRIGATION AND DEBRIDEMENT PERIRECTAL ABSCESS (Left) 1. Continue with daily wet to dry dressing changes 2. Continue  ABX 3. Continue to monitor labs 3. Management of her numerous other medical issues per CCM team, and this is much  appreciated.    LOS: 1 day    Lenard Kampf 04/04/2012

## 2012-04-04 NOTE — Progress Notes (Signed)
eLink Physician-Brief Progress Note Patient Name: [name] DOB: 1956-05-21 MRN: [id number]  Date of Service  04/04/2012   HPI/Events of Note   Pt with Hgb 7.5 and NSTEMI  eICU Interventions  Plan tfx 1 unit prbc d/t pos enzymes and coronary ischemia   Intervention Category Major Interventions:  (anemia)  Asencion Noble 04/04/2012, 3:15 PM

## 2012-04-05 DIAGNOSIS — I214 Non-ST elevation (NSTEMI) myocardial infarction: Secondary | ICD-10-CM

## 2012-04-05 LAB — TYPE AND SCREEN
ABO/RH(D): B POS
Unit division: 0

## 2012-04-05 LAB — CBC
HCT: 24.1 % — ABNORMAL LOW (ref 36.0–46.0)
Hemoglobin: 8.1 g/dL — ABNORMAL LOW (ref 12.0–15.0)
MCH: 26 pg (ref 26.0–34.0)
MCHC: 33.6 g/dL (ref 30.0–36.0)

## 2012-04-05 LAB — GLUCOSE, CAPILLARY
Glucose-Capillary: 101 mg/dL — ABNORMAL HIGH (ref 70–99)
Glucose-Capillary: 176 mg/dL — ABNORMAL HIGH (ref 70–99)
Glucose-Capillary: 209 mg/dL — ABNORMAL HIGH (ref 70–99)
Glucose-Capillary: 256 mg/dL — ABNORMAL HIGH (ref 70–99)
Glucose-Capillary: 259 mg/dL — ABNORMAL HIGH (ref 70–99)

## 2012-04-05 LAB — BASIC METABOLIC PANEL
BUN: 10 mg/dL (ref 6–23)
Calcium: 8.1 mg/dL — ABNORMAL LOW (ref 8.4–10.5)
Creatinine, Ser: 1.12 mg/dL — ABNORMAL HIGH (ref 0.50–1.10)
GFR calc non Af Amer: 53 mL/min — ABNORMAL LOW (ref >90)
Glucose, Bld: 115 mg/dL — ABNORMAL HIGH (ref 70–99)
Sodium: 137 mEq/L (ref 135–145)

## 2012-04-05 LAB — TROPONIN I: Troponin I: 2.52 ng/mL (ref <0.30)

## 2012-04-05 MED ORDER — METOPROLOL SUCCINATE ER 100 MG PO TB24: 200.0000 mg | ORAL_TABLET | Freq: Every day | ORAL | Status: DC

## 2012-04-05 MED ORDER — SODIUM CHLORIDE 0.9 % IJ SOLN
10.0000 mL | Freq: Two times a day (BID) | INTRAMUSCULAR | Status: DC
  Administered 2012-04-05 – 2012-04-10 (×11): 10 mL via INTRAVENOUS

## 2012-04-05 MED ORDER — METOPROLOL SUCCINATE ER 100 MG PO TB24
200.0000 mg | ORAL_TABLET | Freq: Every day | ORAL | Status: DC
  Administered 2012-04-06 – 2012-04-12 (×7): 200 mg via ORAL
  Filled 2012-04-05 (×8): qty 2

## 2012-04-05 MED ORDER — DILTIAZEM HCL ER 240 MG PO CP24
240.0000 mg | ORAL_CAPSULE | Freq: Every day | ORAL | Status: DC
  Filled 2012-04-05: qty 1

## 2012-04-05 MED ORDER — INSULIN GLARGINE 100 UNIT/ML ~~LOC~~ SOLN
20.0000 [IU] | Freq: Two times a day (BID) | SUBCUTANEOUS | Status: DC
  Administered 2012-04-05: 20 [IU] via SUBCUTANEOUS

## 2012-04-05 MED ORDER — POTASSIUM CHLORIDE CRYS ER 20 MEQ PO TBCR
40.0000 meq | EXTENDED_RELEASE_TABLET | Freq: Once | ORAL | Status: AC
  Administered 2012-04-05: 40 meq via ORAL
  Filled 2012-04-05: qty 2

## 2012-04-05 MED ORDER — INSULIN GLARGINE 100 UNIT/ML ~~LOC~~ SOLN
30.0000 [IU] | Freq: Every day | SUBCUTANEOUS | Status: DC
  Administered 2012-04-05: 30 [IU] via SUBCUTANEOUS

## 2012-04-05 MED ORDER — INSULIN ASPART 100 UNIT/ML ~~LOC~~ SOLN
0.0000 [IU] | Freq: Three times a day (TID) | SUBCUTANEOUS | Status: DC
  Administered 2012-04-05: 3 [IU] via SUBCUTANEOUS
  Administered 2012-04-05: 5 [IU] via SUBCUTANEOUS

## 2012-04-05 MED ORDER — ISOSORBIDE MONONITRATE ER 30 MG PO TB24
30.0000 mg | ORAL_TABLET | Freq: Every day | ORAL | Status: DC
  Administered 2012-04-06 – 2012-04-08 (×3): 30 mg via ORAL
  Filled 2012-04-05 (×4): qty 1

## 2012-04-05 NOTE — Progress Notes (Signed)
Name: [name] MRN: [id number] DOB: 1956-02-09    LOS: 2  Referring Provider:  CCS Dr Ninfa Linden Reason for Referral:  Resp Distress  PULMONARY / CRITICAL CARE MEDICINE   Brief patient description:   56 yo female admited 9/2 for gangrenous left buttock /perineal abscess s/p debridement in OR 9/2. CCM asked to assist with comorbid medical illnesses.  Events Since Admission: 9/2//13:Found to have high WBC and a CT scan showing finding consistent with gas gangrene of the perineum and left buttock.  S/P INCISION, DRAINAGE, AND SHARP DEBRIDEMENT LEFT BUTTOCK/PERINEAL ABSCESS 9/3 Ruled in for NSTEMI, received 1 U PRBC   Current Status: Rested well, no chest pain, has not been out of bed yet; back on insulin gtt overnight  Vital Signs: Temp:  [98.1 F (36.7 C)-99.6 F (37.6 C)] 98.9 F (37.2 C) (09/04 0400) Pulse Rate:  [68-84] 73  (09/04 0700) Resp:  [12-26] 21  (09/04 0700) BP: (93-126)/(44-96) 99/55 mmHg (09/04 0700) SpO2:  [92 %-99 %] 97 % (09/04 0700) Arterial Line BP: (62-147)/(45-74) 112/55 mmHg (09/04 0700) Weight:  [122.7 kg (270 lb 8.1 oz)] 122.7 kg (270 lb 8.1 oz) (09/04 0500)  Physical Examination: Gen: comfortable, no acute distress HEENT: NCAT, PERRL PULM: CTA B CV: RRR, no mgr AB: BS+, soft, nontender Ext: warm, trace edema Neuro: A&Ox4, maew    Principal Problem:  *Sepsis Active Problems:  ANEMIA, IRON DEFICIENCY  CAD  Obesity, morbid  Diabetes mellitus  Perirectal abscess  NSTEMI (non-ST elevated myocardial infarction)   ASSESSMENT AND PLAN  PULMONARY  Lab 04/03/12 1355 04/03/12 0751  PHART 7.355 --  PCO2ART 39.2 --  PO2ART 297.0* --  HCO3 21.9 22.7  O2SAT 100.0 70.0   Ventilator Settings: n/a     A:  Extubated post op uneventfully. Normal CXR .  Likely OSA, did not tolerate Autotitration CPAP in hospital overnight 9/2 P:   Needs Sleep study as outpatient Incentive spirometry  CARDIOVASCULAR   Lab 04/04/12 2355 04/04/12  1754 04/04/12 1210 04/04/12 0415 04/03/12 1729  TROPONINI 2.52* 2.62* 3.33* 2.00* 0.45*  LATICACIDVEN -- -- -- -- --  PROBNP -- -- -- -- --   A:  CAD HTN 9/2 NSTEMI, chest pain free; likely Class II MI; not amenable to intervention per cardiology P:  -ASA -continue home b-blocker, hydralazine, HCTZ -nitrates, ranexa per cardiology   RENAL  Lab 04/05/12 0400 04/04/12 0415 04/03/12 1729 04/03/12 1355 04/03/12 0740  NA 137 133* -- 134* 130*  K 3.6 3.2* -- -- --  CL 104 102 -- -- 93*  CO2 23 22 -- -- 22  BUN 10 8 -- -- 15  CREATININE 1.12* 1.02 -- -- 1.26*  CALCIUM 8.1* 8.2* -- -- 9.1  MG -- 1.9 2.0 -- --  PHOS -- -- 1.9* -- --   Intake/Output      09/03 0701 - 09/04 0700 09/04 0701 - 09/05 0700   P.O. 340    I.V. (mL/kg) 2414.4 (19.7)    Blood 362.5    IV Piggyback 485    Total Intake(mL/kg) 3601.9 (29.4)    Urine (mL/kg/hr) 1250 (0.4)    Blood     Total Output 1250    Net +2351.9          Foley:    A:   AKI on admission resolved Hypokalemia  P:   Decrease IVF  GASTROINTESTINAL No results found for this basename: AST:5,ALT:5,ALKPHOS:5,BILITOT:5,PROT:5,ALBUMIN:5 in the last 168 hours  A:  No acute issues P:  Advance diet per GI surgery   HEMATOLOGIC  Lab 04/05/12 0400 04/04/12 0415 04/03/12 1355 04/03/12 0944 04/03/12 0740  HGB 8.1* 7.5* 7.5* -- 8.0*  HCT 24.1* 22.9* 22.0* -- 24.2*  PLT 322 321 -- -- 342  INR -- -- -- 1.71* --  APTT -- -- -- 35 --   A:   Anemia of ? Cause (hgb 11.7 in June 2013) and admit 8gm%;  no active signs of bleeding 1 U PRBC given 9/3 in setting of NSTEMI  P:  Monitor cbc PRBC for hgb < 7gm% Given drop pre surgery, will need outpatient anemia work up by PCP after disharge   INFECTIOUS  Lab 04/05/12 0400 04/04/12 0415 04/03/12 1730 04/03/12 0740  WBC 17.2* 21.5* -- 24.8*  PROCALCITON -- -- 0.19 --   Cultures: 9/2 abscess culture>> multiple species 9/2 wet prep >> clue cells 9/2 blood culture >> 9/2  procalcitonin >>0.19  Antibiotics: 9/2 vanc >>9/2 9/2 Zosyn >> 9/2 clinda >>   A:  Gangrenous perineal abscess, s/p debridement; improving P:   F/u cultures Antibiotics per CCS  ENDOCRINE  Lab 04/05/12 0656 04/05/12 0548 04/05/12 0503 04/05/12 0359 04/05/12 0257  GLUCAP 107* 111* 110* 112* 138*   A:    Hx of Insulin depend DM ; difficult to control, back on insulin gtt overnight  P:   Increase Lantus to 30U given high insulin needs overnight Consult diabetic educator when insulin regimen optimized  NEUROLOGIC  A:  Pain post op mild P:   Per ccs   BEST PRACTICE / DISPOSITION Level of Care:  ICU -->SDU Primary Service:  CCS --> Consultants:  Pccm, [place] cardiology Code Status:  full Diet:  npo DVT Px:  scd GI Px: monuitor Skin Integrity:   Social / Family:  None at bedside  Jillyn Hidden PCCM Pager: [phone] Cell: [phone] If no response, call [phone]

## 2012-04-05 NOTE — Progress Notes (Signed)
Subjective:  No angina  Objective:  Vital Signs in the last 24 hours: Temp:  [98.1 F (36.7 C)-99.2 F (37.3 C)] 98.4 F (36.9 C) (09/04 0740) Pulse Rate:  [68-83] 76  (09/04 1028) Resp:  [12-26] 14  (09/04 1025) BP: (93-126)/(44-96) 113/55 mmHg (09/04 1028) SpO2:  [51 %-99 %] 96 % (09/04 1025) Arterial Line BP: (62-150)/(45-74) 150/68 mmHg (09/04 0900) Weight:  [122.7 kg (270 lb 8.1 oz)] 122.7 kg (270 lb 8.1 oz) (09/04 0500)  Intake/Output from previous day:  Intake/Output Summary (Last 24 hours) at 04/05/12 1058 Last data filed at 04/05/12 1000  Gross per 24 hour  Intake 3716.61 ml  Output   1095 ml  Net 2621.61 ml    Physical Exam: General appearance: alert, cooperative and no distress Lungs: decreased breath sounds Heart: regular rate and rhythm   Rate: 86  Rhythm: normal sinus rhythm  Lab Results:  Basename 04/05/12 0400 04/04/12 0415  WBC 17.2* 21.5*  HGB 8.1* 7.5*  PLT 322 321    Basename 04/05/12 0400 04/04/12 0415  NA 137 133*  K 3.6 3.2*  CL 104 102  CO2 23 22  GLUCOSE 115* 122*  BUN 10 8  CREATININE 1.12* 1.02    Basename 04/04/12 2355 04/04/12 1754  TROPONINI 2.52* 2.62*   Hepatic Function Panel No results found for this basename: PROT,ALBUMIN,AST,ALT,ALKPHOS,BILITOT,BILIDIR,IBILI in the last 72 hours No results found for this basename: CHOL in the last 72 hours  Basename 04/03/12 0944  INR 1.71*    Imaging: Imaging results have been reviewed  Cardiac Studies:  Assessment/Plan:   Principal Problem:  *Sepsis on admission 04/03/12, OR debridment abscess Lt buttock 04/03/12 Active Problems:  CAD, cath 2010, turned down for CABG "poor targets"  NSTEMI  post-op 04/04/12  Diabetes mellitus  Perirectal abscess  History of chronic Iron defeicency anemia with acute post op anemia, transfused after debridment 04/04/12  Proliferative diabetic retinopathy  HTN, on multiple medications prior to admission.  Obesity, morbid  Plan- Will order hold  parameters on antihypertensives as she is on multiple high dose medications. Will check office records for her most recent echo, her LVF was 60-65% in 2010. K+ ordered.    Kerin Ransom PA-C 04/05/2012, 10:58 AM    Agree with note written by Kerin Ransom PAC  Known CAD turned down for surg secondary to poor targets. NSTEMI after surgery. NSSSTTWC. No CP. Exam benign. Will check 2D. No change in therapy. Will continue to follow. Dr Aldona Bar was her cardiologist. Will need to assign to another member of [facility] after D/C.  Lorretta Harp 04/05/2012 4:33 PM

## 2012-04-05 NOTE — Evaluation (Signed)
Physical Therapy Evaluation Patient Details Name: [name] MRN: [id number] DOB: November 17, 1955 Today's Date: 04/05/2012 Time: [id number] PT Time Calculation (min): 35 min  PT Assessment / Plan / Recommendation Clinical Impression  Pt adm with lt buttock abscess and underwent I&D.  Pt with sepsis.  Pt with difficult home situation due to husband working out of town and electricity off at home due to inability to pay.  Recommend LTACH.    PT Assessment  Patient needs continued PT services    Follow Up Recommendations  LTACH    Barriers to Discharge Decreased caregiver support;Other (comment) (electricity off at home)      Equipment Recommendations  Rolling walker with 5" wheels    Recommendations for Other Services     Frequency Min 3X/week    Precautions / Restrictions Precautions Precautions: Fall   Pertinent Vitals/Pain VSS      Mobility  Bed Mobility Bed Mobility: Supine to Sit;Sitting - Scoot to Edge of Bed Supine to Sit: 1: +2 Total assist Supine to Sit: Patient Percentage: 60% Sitting - Scoot to Edge of Bed: 1: +2 Total assist Sitting - Scoot to Edge of Bed: Patient Percentage: 30% Details for Bed Mobility Assistance: Assist to bring trunk up. Transfers Transfers: Sit to Stand;Stand to Sit;Stand Pivot Transfers Sit to Stand: 1: +2 Total assist;With upper extremity assist;From bed Sit to Stand: Patient Percentage: 60% Stand to Sit: 1: +2 Total assist;To chair/3-in-1;With upper extremity assist Stand to Sit: Patient Percentage: 60% Stand Pivot Transfers: 1: +2 Total assist Stand Pivot Transfers: Patient Percentage: 60% Details for Transfer Assistance: Pt with bed to chair transfer using rolling walker.  Pt with knees beginning to buckle right as she got to chair.  Assist to control descent.  Assist to bring hips up.    Exercises     PT Diagnosis: Difficulty walking;Generalized weakness;Acute pain  PT Problem List: Decreased strength;Decreased activity  tolerance;Decreased balance;Decreased mobility;Decreased knowledge of use of DME;Pain PT Treatment Interventions: DME instruction;Gait training;Functional mobility training;Therapeutic activities;Therapeutic exercise;Balance training;Patient/family education   PT Goals Acute Rehab PT Goals PT Goal Formulation: With patient Time For Goal Achievement: 04/19/12 Potential to Achieve Goals: Good Pt will go Supine/Side to Sit: with supervision PT Goal: Supine/Side to Sit - Progress: Goal set today Pt will go Sit to Supine/Side: with supervision PT Goal: Sit to Supine/Side - Progress: Goal set today Pt will go Sit to Stand: with supervision PT Goal: Sit to Stand - Progress: Goal set today Pt will go Stand to Sit: with supervision PT Goal: Stand to Sit - Progress: Goal set today Pt will Ambulate: 51 - 150 feet;with supervision;with least restrictive assistive device PT Goal: Ambulate - Progress: Goal set today  Visit Information  Last PT Received On: 04/05/12 Assistance Needed: +2    Subjective Data  Subjective: Pt states her husband is working out of town right now.   Prior Functioning  Home Living Lives With: Spouse (working out of town) Type of Home: House Home Access: Level entry Home Layout: Multi-level (3 stories with bed/bath on ground and kitchen on 2nd) Alternate Level Stairs-Number of Steps: flight Alternate Level Stairs-Rails: Right Bathroom Shower/Tub: Walk-in shower;Door [employer] Toilet: Standard Home Adaptive Equipment: Straight cane Additional Comments: Electricity at [employer] is turned off currently due to inability to pay electric bill. Prior Function Level of Independence: Independent with assistive device(s) Able to Take Stairs?: Yes Driving: No Vocation: On disability Communication Communication: No difficulties    Cognition  Overall Cognitive Status: Appears within functional limits  for tasks assessed/performed Arousal/Alertness: Awake/alert Orientation  Level: Appears intact for tasks assessed Behavior During Session: [facility] for tasks performed    Extremity/Trunk Assessment Right Lower Extremity Assessment RLE ROM/Strength/Tone: Deficits RLE ROM/Strength/Tone Deficits: grossly 4-/5 Left Lower Extremity Assessment LLE ROM/Strength/Tone: Deficits LLE ROM/Strength/Tone Deficits: grossly 4-/5   Balance Static Sitting Balance Static Sitting - Balance Support: Bilateral upper extremity supported Static Sitting - Level of Assistance: 5: Stand by assistance Static Sitting - Comment/# of Minutes: 3 minutes  End of Session PT - End of Session Activity Tolerance: Patient limited by fatigue Patient left: in chair;with call bell/phone within reach;with nursing in room Nurse Communication: Mobility status  GP     [facility] 04/05/2012, 2:38 PM  [facility] PT [phone]

## 2012-04-05 NOTE — Progress Notes (Signed)
eLink Physician-Brief Progress Note Patient Name: GIULLIANA MCCLENNY DOB: 03-May-1956 MRN: [id number]  Date of Service  04/05/2012   HPI/Events of Note  CBGs elevated  eICU Interventions  lantus increased to 20units bid   Intervention Category Major Interventions: Hyperglycemia - active titration of insulin therapy  Asencion Noble 04/05/2012, 6:11 PM

## 2012-04-05 NOTE — Progress Notes (Signed)
Inpatient Diabetes Program Recommendations  AACE/ADA: New Consensus Statement on Inpatient Glycemic Control (2013)  Target Ranges:  Prepandial:   less than 140 mg/dL      Peak postprandial:   less than 180 mg/dL (1-2 hours)      Critically ill patients:  140 - 180 mg/dL   Reason for Visit: CBG's elevated on Admit. Patient transitioned off insulin drip this morning. Spoke to patient and she verified that she was taking Humalog 75/25 65 units bid prior to admission.  States she prefers this insulin instead of Lantus at discharge.   May need to increase Lantus if CBG's increase.  Consider Lantus 25 units bid (this is less than 1/2 of home basal portion of 75/25). Consider adding Novolog meal coverage 6 units tid with meals.  Will follow.

## 2012-04-05 NOTE — Progress Notes (Signed)
Patient ID: [name], female   DOB: 1955/11/26, 56 y.o.   MRN: [id number] 2 Days Post-Op  Subjective: She appears comfortable this morning, no new c/o pain being managed by PCA.   Objective: Vital signs in last 24 hours: Temp:  [98.1 F (36.7 C)-99.2 F (37.3 C)] 98.4 F (36.9 C) (09/04 0740) Pulse Rate:  [68-83] 83  (09/04 0900) Resp:  [12-26] 17  (09/04 0900) BP: (93-126)/(44-96) 126/58 mmHg (09/04 0900) SpO2:  [51 %-99 %] 51 % (09/04 0900) Arterial Line BP: (62-150)/(45-74) 150/68 mmHg (09/04 0900) Weight:  [270 lb 8.1 oz (122.7 kg)] 270 lb 8.1 oz (122.7 kg) (09/04 0500) Last BM Date: 04/02/12  Intake/Output from previous day: 09/03 0701 - 09/04 0700 In: 3601.9 [P.O.:340; I.V.:2414.4; Blood:362.5; IV Piggyback:485] Out: 1250 [Urine:1250] Intake/Output this shift: Total I/O In: 542 [P.O.:275; I.V.:267] Out: 65 [Urine:65]  General appearance: alert, cooperative, appears stated age, no distress and morbidly obese Chest: CTA bilaterally Cardiac: RRR Left Buttock wound: Some serous drainage on exterior abd pad, packing was removed and this too contained serous appearing drainage. The wound itself is w/o bleeding, tissue appears well perfused. The wound was repacked with wet to dry kerlex gauze.  WBC has trended downward and is currently(17.2)  VSS,afebrile.   Lab Results:   Basename 04/05/12 0400 04/04/12 0415  WBC 17.2* 21.5*  HGB 8.1* 7.5*  HCT 24.1* 22.9*  PLT 322 321   BMET  Basename 04/05/12 0400 04/04/12 0415  NA 137 133*  K 3.6 3.2*  CL 104 102  CO2 23 22  GLUCOSE 115* 122*  BUN 10 8  CREATININE 1.12* 1.02  CALCIUM 8.1* 8.2*   PT/INR  Basename 04/03/12 0944  LABPROT 20.4*  INR 1.71*   ABG  Basename 04/03/12 1355 04/03/12 0751  PHART 7.355 --  HCO3 21.9 22.7    Studies/Results: Ct Abdomen Pelvis Wo Contrast  04/03/2012  *RADIOLOGY REPORT*  Clinical Data: Diabetic with left buttock abscess.  CT ABDOMEN AND PELVIS WITHOUT CONTRAST   Technique:  Multidetector CT imaging of the abdomen and pelvis was performed following the standard protocol without intravenous contrast.  Comparison: None.  Findings: Gas-forming infectious process is identified in the subcutaneous fat of the left medial buttocks.  Gas extends deep into the subcutaneous fat and approaches the perineum.  There is no evidence of extension into the peritoneal cavity.  Soft tissue gas and surrounding inflammatory changes are seen without evidence of a significant component of fluid or focal drainable abscess.  The liver, gallbladder, pancreas, spleen, adrenal glands and kidneys are unremarkable by unenhanced CT.  Bowel loops are normal caliber.  The bladder is unremarkable.  Uterus and adnexal regions are unremarkable by CT.  IMPRESSION: Gas-forming infectious process in the subcutaneous fat of the medial left buttocks.  Gas approaches the perineum.  No evidence of focal fluid collection or drainable abscess by CT.   Original Report Authenticated By: Azzie Roup, M.D.    Dg Chest Port 1 View  04/03/2012  *RADIOLOGY REPORT*  Clinical Data: Central venous catheter placement  PORTABLE CHEST - 1 VIEW  Comparison: 04/03/2012  Findings: There is a left subclavian catheter with tip in the projection of the SVC.  No pneumothorax identified.  Heart size appears enlarged.  There is no pleural effusion or edema identified.  No airspace consolidation identified.  IMPRESSION:  1.  No pneumothorax after catheter placement.   Original Report Authenticated By: Angelita Ingles, M.D.     Anti-infectives: Anti-infectives  Start     Dose/Rate Route Frequency Ordered Stop   04/03/12 2200   piperacillin-tazobactam (ZOSYN) IVPB 3.375 g        3.375 g 12.5 mL/hr over 240 Minutes Intravenous 3 times per day 04/03/12 1712     04/03/12 2000   clindamycin (CLEOCIN) IVPB 300 mg     Comments: Pharmacy may adjust dose      300 mg 100 mL/hr over 30 Minutes Intravenous Every 8 hours 04/03/12  1654     04/03/12 1200   clindamycin (CLEOCIN) IVPB 600 mg  Status:  Discontinued        600 mg 100 mL/hr over 30 Minutes Intravenous  Once 04/03/12 1152 04/03/12 1716   04/03/12 1200   piperacillin-tazobactam (ZOSYN) IVPB 3.375 g        3.375 g 12.5 mL/hr over 240 Minutes Intravenous  Once 04/03/12 1152     04/03/12 1100   vancomycin (VANCOCIN) 2,000 mg in sodium chloride 0.9 % 500 mL IVPB        2,000 mg 250 mL/hr over 120 Minutes Intravenous To Emergency Dept 04/03/12 0957 04/03/12 1334          Assessment/Plan:  Patient Active Problem List  Diagnosis  . DIABETES MELLITUS, TYPE II, ON INSULIN, UNCONTROLLED  . ANEMIA, IRON DEFICIENCY  . Proliferative diabetic retinopathy  . VISUAL CHANGES  . HYPERTENSION, BENIGN ESSENTIAL  . CAD  . GERD  . MENORRHAGIA, PERIMENOPAUSAL  . SHOULDER PAIN, LEFT  . DIZZINESS  . PERIPHERAL EDEMA  . DETACHED RETINA, BILATERAL, HX OF  . Chest pain at rest, secondary to viral syndrome.  negative MI  . Obesity, morbid  . Nausea and vomiting, secondary to viral syndrome  . Diabetes mellitus  . Sepsis  . Perirectal abscess  . NSTEMI (non-ST elevated myocardial infarction)    s/p Procedure(s) (LRB): IRRIGATION AND DEBRIDEMENT PERIRECTAL ABSCESS (Left) 1. Continue with daily wet to dry dressing changes 2. Continue  ABX 3. Continue to monitor labs 4. Transfer to floor 5. Management of DM and other medical issues per [facility] hospitalists 6. Mobilize; PT working with patient.   LOS: 2 days    DUANNE, JEREMY 04/05/2012  Wound is doing well. Her main issues are medical right now.

## 2012-04-06 DIAGNOSIS — N760 Acute vaginitis: Secondary | ICD-10-CM

## 2012-04-06 DIAGNOSIS — G4733 Obstructive sleep apnea (adult) (pediatric): Secondary | ICD-10-CM | POA: Diagnosis present

## 2012-04-06 DIAGNOSIS — E119 Type 2 diabetes mellitus without complications: Secondary | ICD-10-CM

## 2012-04-06 DIAGNOSIS — A499 Bacterial infection, unspecified: Secondary | ICD-10-CM

## 2012-04-06 LAB — BASIC METABOLIC PANEL
BUN: 9 mg/dL (ref 6–23)
CO2: 24 mEq/L (ref 19–32)
Calcium: 8.4 mg/dL (ref 8.4–10.5)
Chloride: 101 mEq/L (ref 96–112)
Creatinine, Ser: 0.95 mg/dL (ref 0.50–1.10)
GFR calc Af Amer: 76 mL/min — ABNORMAL LOW (ref >90)
GFR calc non Af Amer: 65 mL/min — ABNORMAL LOW (ref >90)
Glucose, Bld: 224 mg/dL — ABNORMAL HIGH (ref 70–99)
Potassium: 3.7 mEq/L (ref 3.5–5.1)
Sodium: 134 mEq/L — ABNORMAL LOW (ref 135–145)

## 2012-04-06 LAB — GLUCOSE, CAPILLARY: Glucose-Capillary: 198 mg/dL — ABNORMAL HIGH (ref 70–99)

## 2012-04-06 LAB — CBC
HCT: 25.4 % — ABNORMAL LOW (ref 36.0–46.0)
MCH: 25.4 pg — ABNORMAL LOW (ref 26.0–34.0)
MCHC: 32.7 g/dL (ref 30.0–36.0)
MCV: 77.7 fL — ABNORMAL LOW (ref 78.0–100.0)
RDW: 14.8 % (ref 11.5–15.5)
WBC: 13.7 10*3/uL — ABNORMAL HIGH (ref 4.0–10.5)

## 2012-04-06 LAB — HEMOGLOBIN A1C
Hgb A1c MFr Bld: 8.5 % — ABNORMAL HIGH (ref <5.7)
Mean Plasma Glucose: 197 mg/dL — ABNORMAL HIGH (ref <117)

## 2012-04-06 MED ORDER — INSULIN ASPART 100 UNIT/ML ~~LOC~~ SOLN
0.0000 [IU] | Freq: Three times a day (TID) | SUBCUTANEOUS | Status: DC
  Administered 2012-04-06 – 2012-04-08 (×7): 3 [IU] via SUBCUTANEOUS
  Administered 2012-04-09: 2 [IU] via SUBCUTANEOUS
  Administered 2012-04-10: 3 [IU] via SUBCUTANEOUS
  Administered 2012-04-10 – 2012-04-11 (×2): 2 [IU] via SUBCUTANEOUS
  Administered 2012-04-11: 8 [IU] via SUBCUTANEOUS
  Administered 2012-04-11: 5 [IU] via SUBCUTANEOUS
  Administered 2012-04-12: 2 [IU] via SUBCUTANEOUS
  Administered 2012-04-12: 3 [IU] via SUBCUTANEOUS
  Administered 2012-04-12: 5 [IU] via SUBCUTANEOUS
  Administered 2012-04-13: 2 [IU] via SUBCUTANEOUS

## 2012-04-06 MED ORDER — LOSARTAN POTASSIUM 50 MG PO TABS
50.0000 mg | ORAL_TABLET | Freq: Every day | ORAL | Status: DC
  Administered 2012-04-06 – 2012-04-07 (×2): 50 mg via ORAL
  Filled 2012-04-06 (×2): qty 1

## 2012-04-06 MED ORDER — INSULIN ASPART 100 UNIT/ML ~~LOC~~ SOLN
0.0000 [IU] | Freq: Three times a day (TID) | SUBCUTANEOUS | Status: DC
  Administered 2012-04-06: 7 [IU] via SUBCUTANEOUS

## 2012-04-06 MED ORDER — INSULIN ASPART PROT & ASPART (70-30 MIX) 100 UNIT/ML ~~LOC~~ SUSP
60.0000 [IU] | Freq: Two times a day (BID) | SUBCUTANEOUS | Status: DC
  Administered 2012-04-06: 60 [IU] via SUBCUTANEOUS
  Filled 2012-04-06: qty 3

## 2012-04-06 MED ORDER — INSULIN ASPART 100 UNIT/ML ~~LOC~~ SOLN
0.0000 [IU] | Freq: Every day | SUBCUTANEOUS | Status: DC
  Administered 2012-04-07: 2 [IU] via SUBCUTANEOUS

## 2012-04-06 MED ORDER — INSULIN ASPART 100 UNIT/ML ~~LOC~~ SOLN: 0.0000 [IU] | Freq: Every day | SUBCUTANEOUS | Status: DC

## 2012-04-06 MED ORDER — ASPIRIN 81 MG PO CHEW
81.0000 mg | CHEWABLE_TABLET | Freq: Every day | ORAL | Status: DC
  Administered 2012-04-06 – 2012-04-13 (×8): 81 mg via ORAL
  Filled 2012-04-06 (×10): qty 1

## 2012-04-06 MED ORDER — PANTOPRAZOLE SODIUM 40 MG PO TBEC
40.0000 mg | DELAYED_RELEASE_TABLET | Freq: Every day | ORAL | Status: DC
  Administered 2012-04-06 – 2012-04-12 (×7): 40 mg via ORAL
  Filled 2012-04-06 (×8): qty 1

## 2012-04-06 NOTE — Progress Notes (Signed)
Inpatient Diabetes Program Recommendations  AACE/ADA: New Consensus Statement on Inpatient Glycemic Control (2013)  Target Ranges:  Prepandial:   less than 140 mg/dL      Peak postprandial:   less than 180 mg/dL (1-2 hours)      Critically ill patients:  140 - 180 mg/dL   Results for DANEJA, FLAMMIA (MRN [id number]) as of 04/06/2012 11:26  Ref. Range 04/05/2012 07:48 04/05/2012 12:07 04/05/2012 17:44 04/05/2012 21:49  Glucose-Capillary Latest Range: 70-99 mg/dL 101 (H) 181 (H) 248 (H) 256 (H)   Results for CELEST, BRECKNER (MRN [id number]) as of 04/06/2012 11:26  Ref. Range 04/06/2012 07:43  Glucose-Capillary Latest Range: 70-99 mg/dL 204 (H)   Noted 70/30 insulin 60 units bid with meals started today.  Will get 1st dose at supper today.  Noted Novolog SSI d/c'd.  Inpatient Diabetes Program Recommendations Correction (SSI): Please reinstate SSI- Novolog Moderate SSI tid ac + HS.  Note: Will follow. Wyn Quaker RN, MSN, CDE Diabetes Coordinator Inpatient Diabetes Program [phone]

## 2012-04-06 NOTE — Progress Notes (Signed)
Echocardiogram 2D Echocardiogram has been performed.  [name] 04/06/2012, 11:51 AM

## 2012-04-06 NOTE — Progress Notes (Signed)
Subjective:  No chest pain  Objective:  Vital Signs in the last 24 hours: Temp:  [97.6 F (36.4 C)-99 F (37.2 C)] 98.2 F (36.8 C) (09/05 0745) Pulse Rate:  [73-86] 73  (09/05 0745) Resp:  [11-21] 11  (09/05 0745) BP: (103-139)/(45-80) 123/56 mmHg (09/05 0745) SpO2:  [96 %-100 %] 100 % (09/05 0745) Weight:  [129 kg (284 lb 6.3 oz)] 129 kg (284 lb 6.3 oz) (09/05 0005)  Intake/Output from previous day:  Intake/Output Summary (Last 24 hours) at 04/06/12 [id number] Last data filed at 04/06/12 0600  Gross per 24 hour  Intake 1289.05 ml  Output    700 ml  Net 589.05 ml    Physical Exam: General appearance: alert, cooperative, no distress and morbidly obese Lungs: clear to auscultation bilaterally Heart: regular rate and rhythm   Rate: 74  Rhythm: normal sinus rhythm  Lab Results:  Basename 04/05/12 0400 04/04/12 0415  WBC 17.2* 21.5*  HGB 8.1* 7.5*  PLT 322 321    Basename 04/06/12 0435 04/05/12 0400  NA 134* 137  K 3.7 3.6  CL 101 104  CO2 24 23  GLUCOSE 224* 115*  BUN 9 10  CREATININE 0.95 1.12*    Basename 04/04/12 2355 04/04/12 1754  TROPONINI 2.52* 2.62*   Hepatic Function Panel No results found for this basename: PROT,ALBUMIN,AST,ALT,ALKPHOS,BILITOT,BILIDIR,IBILI in the last 72 hours No results found for this basename: CHOL in the last 72 hours  Basename 04/03/12 0944  INR 1.71*    Imaging: Imaging results have been reviewed  Cardiac Studies:  Assessment/Plan:   Principal Problem:  *Sepsis due to undetermined organism without resultant organ failure  Active Problems:  CAD, cath 2010, turned down for CABG "poor targets"  NSTEMI  post-op 04/04/12, Troponin pk 3.3  Diabetes mellitus type 2, uncontrolled  Gangrenous Perirectal abscess s/p I/D and debridement 9/2  History of chronic Iron defeicency anemia with acute post op anemia, transfused after debridment 04/04/12  Proliferative diabetic retinopathy  HTN, on multiple medications prior to  admission.  GERD  Obesity, morbid  Presumed OSA (obstructive sleep apnea)  Plan- It's not clear what medicines she was actually taking at home as she was getting her medication from [place]. For now will prioritize her medications, keeping her on generics where possible and follow her B/P. Continue Toprol 200mg  unless her HR drops below 70, then I would cut that back. Stop Diltiazem. Resume Cozaar 50mg  and consider resuming HCTZ before discharge. Continue Imdur, we can arrange for Ranexa samples if she has break through chest pain. Resume ASA 81mg  and PPI.  Echo ordered, we will continue to follow. She will need a sleep study as an OP.    Kerin Ransom PA-C 04/06/2012, 9:28 AM

## 2012-04-06 NOTE — Progress Notes (Signed)
PT has been set up for an overnight pulse ox study wearing her cpap with 3 lpm o2. PT has current o2 sats of 100%, tolerating well, no distress noted. RT will continue to monitor.

## 2012-04-06 NOTE — Consult Note (Signed)
[facility] Consultation Note [place] TEAM 1 - Stepdown/ICU TEAM   [name] [id number] DOB: 08/18/1955 DOA: 04/03/2012 PCP: Aldona Bar, MD  Brief narrative: 56 year old female patient admitted by the surgical service on 04/03/2012 her gangrenous left buttock/perineal abscess. She was emergently taken to the OR on the same date for incision and drainage as well as extensive debridement.  Pulmonary critical care medicine was asked to assist with managing patient's comorbid medical illnesses since the surgical team felt the patient's surgical problems wanted observation in the ICU immediately postop. Patient stabilized enough from a surgical standpoint to move to the step down unit. On 04/06/2012 [facility]/team 1 assumed consulted medical care for this patient from the critical care medicine team.  Assessment/Plan: Principal Problem:  *Sepsis due to undetermined organism  *Source presumed to be from abscess *Cultures are still pending *Remains hemodynamically stable  Active Problems:  NSTEMI  post-op 04/04/12/ CAD, cath 2010, turned down for CABG "poor targets" *Appreciate cardiology assistance *Continue Imdur.  *Was on Ranexa prior to admission but appears she was unable to afford this so plan is to arrange for samples of this medication (from cardiology) if she develops breakthrough chest pain *Low-dose aspirin 81 mg being resumed today *Followup on echo *Medical management only- not a surgical candidate   Gangrenous Perirectal abscess s/p I/D and debridement 9/2 *Per primary surgical team    History of chronic Iron defeicency anemia with acute post op anemia, transfused after debridment 04/04/12 *Hemoglobin 8.1 on 04/05/2012 so we'll repeat in the morning   HTN, on multiple medications prior to admission. *Blood pressure very well controlled and actually systolic blood pressure has been ranging between 118 and 140 *Given her financial situation unclear if  patient was taking all of the medication she brought in with her at time of admission *Agree with cardiology note. Plan is to decrease Toprol dosage only if heart rate drops below 70. They have stopped Diltiazem but have resumed her Cozaar and are considering resuming her hydrochlorothiazide before discharge.   Obesity, morbid/ Presumed OSA (obstructive sleep apnea) *Critical care medicine suspects the patient has underlying sleep apnea since she did not tolerate auto titration of CPAP during the hospitalization *Will obtain nocturnal trending pulse oximetry during this admission *I have spoken with Dr. Lake Bells pulmonologist to arrange for outpatient polysomnogram especially since the patient has Medicaid and no primary care physician. *Patient has not tolerated the CPAP mask during this hospitalization and therefore has not been utilizing-we'll reorder nocturnal CPAP see if a smaller mask will work better for this patient   Diabetes mellitus type 2, uncontrolled *Patient was on 75/25 insulin twice a day at home which obviously is a more cost effective medication instead of Lantus. The hospital only carries 70/30 insulin so we will order this but utilized lower doses since suspect patient noncompliant with diet home *Initial plan was to discontinue sliding scale insulin since we will be utilizing 7030 insulin but diabetes educator recommends moderate sliding scale insulin with hour of sleep coverage *Check hemoglobin A1c  Clue cells on wet prep *Has been on IV clindamycin since 04/03/2012 which would also treat bacterial vaginosis    Proliferative diabetic retinopathy   GERD *Continue PPI  Limited access to outpatient healthcare *Patient reports she has Medicaid but has been unable to find an outpatient primary care physician who accepts Medicaid *She previously was an established patient at [facility] *Case management consultation   DVT prophylaxis: Heparin subcutaneous 3 times a  day Code Status:  Full Family Communication: Spoke with patient Disposition Plan: At discretion of primary team  Consultants: [facility]  Procedures: Incision, drainage and sharp debridement left buttock and perineal abscess with findings of abscess and gaseous gangrene-04/03/2012 by Dr. Ninfa Linden  Cultures:  9/2 abscess culture>> multiple species  9/2 wet prep >> clue cells  9/2 blood culture >>  9/2 procalcitonin >>0.19  Antibiotics: 9/2 vanc >>9/2  9/2 Zosyn >>  9/2 clinda >>   HPI/Subjective: Patient alert and mainly complains of wound site pain. Denies chest pain or shortness of breath. Reiterates frustration at inability to establish with primary care physician despite having Medicaid.   Objective: Blood pressure 118/71, pulse 73, temperature 98.2 F (36.8 C), temperature source Oral, resp. rate 11, height 5\' 3"  (1.6 m), weight 129 kg (284 lb 6.3 oz), SpO2 100.00%.  Intake/Output Summary (Last 24 hours) at 04/06/12 1123 Last data filed at 04/06/12 1044  Gross per 24 hour  Intake 1387.05 ml  Output    640 ml  Net 747.05 ml     Exam: General: No acute respiratory distress Lungs: Clear to auscultation bilaterally without wheezes or crackles Cardiovascular: Regular rate and rhythm without murmur gallop or rub normal S1 and S2 Abdomen: Nontender, nondistended, soft, bowel sounds positive, no rebound, no ascites, no appreciable mass Extremities: No significant cyanosis, clubbing, or edema bilateral lower extremities  Data Reviewed: Basic Metabolic Panel:  Lab [id number] 04/05/12 0400 04/04/12 0415 04/03/12 1729 04/03/12 1355 04/03/12 0740  NA 134* 137 133* -- 134* 130*  K 3.7 3.6 3.2* -- 3.6 3.4*  CL 101 104 102 -- -- 93*  CO2 24 23 22  -- -- 22  GLUCOSE 224* 115* 122* -- -- 361*  BUN 9 10 8  -- -- 15  CREATININE 0.95 1.12* 1.02 -- -- 1.26*  CALCIUM 8.4 8.1* 8.2* -- -- 9.1  MG -- -- 1.9 2.0 -- --  PHOS --  -- -- 1.9* -- --   Liver Function Tests: No results found for this basename: AST:5,ALT:5,ALKPHOS:5,BILITOT:5,PROT:5,ALBUMIN:5 in the last 168 hours No results found for this basename: LIPASE:5,AMYLASE:5 in the last 168 hours No results found for this basename: AMMONIA:5 in the last 168 hours CBC:  Lab 04/05/12 0400 04/04/12 0415 04/03/12 1355 04/03/12 0740  WBC 17.2* 21.5* -- 24.8*  NEUTROABS -- -- -- --  HGB 8.1* 7.5* 7.5* 8.0*  HCT 24.1* 22.9* 22.0* 24.2*  MCV 77.5* 77.4* -- 76.3*  PLT 322 321 -- 342   Cardiac Enzymes:  Lab 04/04/12 2355 04/04/12 1754 04/04/12 1210 04/04/12 0415 04/03/12 1729  CKTOTAL -- -- -- -- --  CKMB -- -- -- -- --  CKMBINDEX -- -- -- -- --  TROPONINI 2.52* 2.62* 3.33* 2.00* 0.45*   BNP (last 3 results) No results found for this basename: PROBNP:3 in the last 8760 hours CBG:  Lab 04/06/12 0743 04/05/12 2149 04/05/12 1744 04/05/12 1207 04/05/12 0748  GLUCAP 204* 256* 248* 181* 101*    Recent Results (from the past 240 hour(s))  WET PREP, GENITAL     Status: Abnormal   Collection Time   04/03/12  9:51 AM      Component Value Range Status Comment   Yeast Wet Prep HPF POC NONE SEEN  NONE SEEN Final    Trich, Wet Prep NONE SEEN  NONE SEEN Final    Clue Cells Wet Prep HPF POC MODERATE (*) NONE SEEN Final    WBC, Wet Prep HPF POC FEW (*) NONE SEEN  Final   CULTURE, BLOOD (ROUTINE X 2)     Status: Normal (Preliminary result)   Collection Time   04/03/12 10:33 AM      Component Value Range Status Comment   Specimen Description BLOOD LEFT ARM   Final    Special Requests BOTTLES DRAWN AEROBIC AND ANAEROBIC 10CC   Final    Culture  Setup Time 04/03/2012 19:05   Final    Culture     Final    Value:        BLOOD CULTURE RECEIVED NO GROWTH TO DATE CULTURE WILL BE HELD FOR 5 DAYS BEFORE ISSUING A FINAL NEGATIVE REPORT   Report Status PENDING   Incomplete   CULTURE, BLOOD (ROUTINE X 2)     Status: Normal (Preliminary result)   Collection Time   04/03/12 10:45 AM       Component Value Range Status Comment   Specimen Description BLOOD LEFT HAND   Final    Special Requests BOTTLES DRAWN AEROBIC AND ANAEROBIC 5CC   Final    Culture  Setup Time 04/03/2012 19:06   Final    Culture     Final    Value:        BLOOD CULTURE RECEIVED NO GROWTH TO DATE CULTURE WILL BE HELD FOR 5 DAYS BEFORE ISSUING A FINAL NEGATIVE REPORT   Report Status PENDING   Incomplete   CULTURE, ROUTINE-ABSCESS     Status: Normal (Preliminary result)   Collection Time   04/03/12  2:01 PM      Component Value Range Status Comment   Specimen Description ABSCESS PERIRECTAL   Final    Special Requests PATIENT ON FOLLOWING VANCOMYCIN,ZOSYN,CLEOCIN   Final    Gram Stain     Final    Value: NO WBC SEEN     NO SQUAMOUS EPITHELIAL CELLS SEEN     FEW GRAM POSITIVE COCCI IN PAIRS     RARE GRAM POSITIVE RODS   Culture Culture reincubated for better growth   Final    Report Status PENDING   Incomplete   ANAEROBIC CULTURE     Status: Normal (Preliminary result)   Collection Time   04/03/12  2:01 PM      Component Value Range Status Comment   Specimen Description ABSCESS PERIRECTAL   Final    Special Requests PATIENT ON FOLLOWING VANCOMYCIN,ZOSYN,CLEOCIN   Final    Gram Stain PENDING   Incomplete    Culture     Final    Value: NO ANAEROBES ISOLATED; CULTURE IN PROGRESS FOR 5 DAYS   Report Status PENDING   Incomplete   MRSA PCR SCREENING     Status: Normal   Collection Time   04/03/12  6:28 PM      Component Value Range Status Comment   MRSA by PCR NEGATIVE  NEGATIVE Final      Studies:  Recent x-ray studies have been reviewed in detail by the Attending Physician  Scheduled Meds:  Reviewed in detail by the Attending Physician   Erin Hearing, ANP [facility] Office  [phone] Pager [phone]  On-Call/Text Page:      [email or web address]      password [email or web address]  If 7PM-7AM, please contact night-coverage [email or web address] Password [email or web address] 04/06/2012, 11:23 AM   LOS: 3 days   I have  reviewed the chart, examined the patient and discussed the plan with the patient. I have modified the above note which I agree with.   Debbe Odea, MD [phone]

## 2012-04-06 NOTE — Progress Notes (Signed)
Patient ID: SARYN PANICK, female   DOB: February 19, 1956, 56 y.o.   MRN: [id number] Patient ID: KELICIA PULS, female   DOB: 30-Dec-1955, 56 y.o.   MRN: [id number] 3 Days Post-Op  Subjective: She appears comfortable this morning, no new c/o. Eating breakfast.  Objective: Vital signs in last 24 hours: Temp:  [97.6 F (36.4 C)-99 F (37.2 C)] 98.2 F (36.8 C) (09/05 0745) Pulse Rate:  [73-86] 73  (09/05 0745) Resp:  [11-21] 11  (09/05 0745) BP: (103-139)/(45-80) 123/56 mmHg (09/05 0745) SpO2:  [96 %-100 %] 100 % (09/05 0745) Weight:  [284 lb 6.3 oz (129 kg)] 284 lb 6.3 oz (129 kg) (09/05 0005) Last BM Date: 04/02/12  Intake/Output from previous day: 09/04 0701 - 09/05 0700 In: 1831.1 [P.O.:515; I.V.:1212.1; IV Piggyback:104] Out: 765 [Urine:765] Intake/Output this shift:    General appearance: alert, cooperative, appears stated age, no distress and morbidly obese Chest: CTA bilaterally Cardiac: RRR Left Buttock wound: Some serous drainage on exterior abd pad. Serous drainage on gauze, wound edges appear well perfused. VSS,afebrile. CBC pending   Lab Results:   Basename 04/05/12 0400 04/04/12 0415  WBC 17.2* 21.5*  HGB 8.1* 7.5*  HCT 24.1* 22.9*  PLT 322 321   BMET  Basename 04/06/12 0435 04/05/12 0400  NA 134* 137  K 3.7 3.6  CL 101 104  CO2 24 23  GLUCOSE 224* 115*  BUN 9 10  CREATININE 0.95 1.12*  CALCIUM 8.4 8.1*   PT/INR  Basename 04/03/12 0944  LABPROT 20.4*  INR 1.71*   ABG  Basename 04/03/12 1355  PHART 7.355  HCO3 21.9    Studies/Results: No results found.  Anti-infectives: Anti-infectives     Start     Dose/Rate Route Frequency Ordered Stop   04/03/12 2200   piperacillin-tazobactam (ZOSYN) IVPB 3.375 g  Status:  Discontinued        3.375 g 12.5 mL/hr over 240 Minutes Intravenous 3 times per day 04/03/12 1712 04/05/12 2018   04/03/12 2000   clindamycin (CLEOCIN) IVPB 300 mg  Status:  Discontinued     Comments: Pharmacy may adjust dose      300 mg 100 mL/hr over 30 Minutes Intravenous Every 8 hours 04/03/12 1654 04/05/12 2018   04/03/12 1200   clindamycin (CLEOCIN) IVPB 600 mg  Status:  Discontinued        600 mg 100 mL/hr over 30 Minutes Intravenous  Once 04/03/12 1152 04/03/12 1716   04/03/12 1200   piperacillin-tazobactam (ZOSYN) IVPB 3.375 g  Status:  Discontinued        3.375 g 12.5 mL/hr over 240 Minutes Intravenous  Once 04/03/12 1152 04/05/12 2018   04/03/12 1100   vancomycin (VANCOCIN) 2,000 mg in sodium chloride 0.9 % 500 mL IVPB        2,000 mg 250 mL/hr over 120 Minutes Intravenous To Emergency Dept 04/03/12 0957 04/03/12 1334          Assessment/Plan:  Patient Active Problem List  Diagnosis  . DIABETES MELLITUS, TYPE II, ON INSULIN, UNCONTROLLED  . History of chronic Iron defeicency anemia with acute post op anemia, transfused after debridment 04/04/12  . Proliferative diabetic retinopathy  . VISUAL CHANGES  . HTN, on multiple medications prior to admission.  . CAD, cath 2010, turned down for CABG "poor targets"  . GERD  . MENORRHAGIA, PERIMENOPAUSAL  . SHOULDER PAIN, LEFT  . DIZZINESS  . PERIPHERAL EDEMA  . DETACHED RETINA, BILATERAL, HX OF  . Chest pain at rest,  secondary to viral syndrome.  negative MI  . Obesity, morbid  . Nausea and vomiting, secondary to viral syndrome  . Diabetes mellitus type 2, uncontrolled  . Sepsis due to undetermined organism without resultant organ failure  . Gangrenous Perirectal abscess s/p I/D and debridement 9/2  . NSTEMI  post-op 04/04/12  . Presumed OSA (obstructive sleep apnea)    s/p Procedure(s) (LRB): IRRIGATION AND DEBRIDEMENT PERIRECTAL ABSCESS (Left) 1. Continue with daily wet to dry dressing changes 2. Continue  ABX 3. Continue to monitor labs 4. Transfer to floor 5. Management of DM and other medical issues per [facility] 6. Mobilize; PT working with patient.   LOS: 3 days    Meranda Dechaine 04/06/2012

## 2012-04-06 NOTE — Progress Notes (Signed)
Pt. Seen and examined. Agree with the NP/PA-C note as written.  I reviewed her echocardiogram today. LV function appears to be preserved, however, it was a very poor quality study. She is not having chest pain at this time. I agree with Koren Bound recommendations regarding blood pressure medicines. I doubt she will be able to afford or take Ranexa long term. Unfortunately sounds like diffuse CAD with few revascularization options. Agree with outpatient sleep study. Not ready for discharge.  Pixie Casino, MD, [facility] Attending Cardiologist [facility]

## 2012-04-07 DIAGNOSIS — R112 Nausea with vomiting, unspecified: Secondary | ICD-10-CM

## 2012-04-07 DIAGNOSIS — I5032 Chronic diastolic (congestive) heart failure: Secondary | ICD-10-CM | POA: Diagnosis present

## 2012-04-07 LAB — CULTURE, ROUTINE-ABSCESS: Gram Stain: NONE SEEN

## 2012-04-07 LAB — GLUCOSE, CAPILLARY
Glucose-Capillary: 133 mg/dL — ABNORMAL HIGH (ref 70–99)
Glucose-Capillary: 181 mg/dL — ABNORMAL HIGH (ref 70–99)
Glucose-Capillary: 203 mg/dL — ABNORMAL HIGH (ref 70–99)

## 2012-04-07 LAB — FOLATE: Folate: 12 ng/mL

## 2012-04-07 MED ORDER — EZETIMIBE-SIMVASTATIN 10-40 MG PO TABS
1.0000 | ORAL_TABLET | Freq: Every day | ORAL | Status: DC
  Filled 2012-04-07: qty 1

## 2012-04-07 MED ORDER — BIOTENE DRY MOUTH MT LIQD
15.0000 mL | Freq: Two times a day (BID) | OROMUCOSAL | Status: DC
  Administered 2012-04-08 – 2012-04-13 (×10): 15 mL via OROMUCOSAL

## 2012-04-07 MED ORDER — EZETIMIBE 10 MG PO TABS
10.0000 mg | ORAL_TABLET | Freq: Every day | ORAL | Status: DC
  Administered 2012-04-07 – 2012-04-08 (×2): 10 mg via ORAL
  Filled 2012-04-07 (×3): qty 1

## 2012-04-07 MED ORDER — NITROGLYCERIN 0.4 MG SL SUBL
0.4000 mg | SUBLINGUAL_TABLET | SUBLINGUAL | Status: DC | PRN
  Administered 2012-04-11: 0.4 mg via SUBLINGUAL
  Filled 2012-04-07: qty 25

## 2012-04-07 MED ORDER — LOSARTAN POTASSIUM 50 MG PO TABS
100.0000 mg | ORAL_TABLET | Freq: Every day | ORAL | Status: DC
  Administered 2012-04-08 – 2012-04-13 (×6): 100 mg via ORAL
  Filled 2012-04-07 (×6): qty 2

## 2012-04-07 MED ORDER — INSULIN ASPART PROT & ASPART (70-30 MIX) 100 UNIT/ML ~~LOC~~ SUSP
30.0000 [IU] | Freq: Two times a day (BID) | SUBCUTANEOUS | Status: DC
  Administered 2012-04-07 – 2012-04-08 (×2): 30 [IU] via SUBCUTANEOUS
  Filled 2012-04-07: qty 3

## 2012-04-07 MED ORDER — ATORVASTATIN CALCIUM 20 MG PO TABS
20.0000 mg | ORAL_TABLET | Freq: Every day | ORAL | Status: DC
  Administered 2012-04-07 – 2012-04-12 (×6): 20 mg via ORAL
  Filled 2012-04-07 (×7): qty 1

## 2012-04-07 MED ORDER — BIOTENE DRY MOUTH MT LIQD
15.0000 mL | Freq: Two times a day (BID) | OROMUCOSAL | Status: DC
  Administered 2012-04-07 (×2): 15 mL via OROMUCOSAL

## 2012-04-07 MED ORDER — HYDROCHLOROTHIAZIDE 12.5 MG PO CAPS
12.5000 mg | ORAL_CAPSULE | Freq: Every day | ORAL | Status: DC
  Administered 2012-04-07 – 2012-04-08 (×2): 12.5 mg via ORAL
  Filled 2012-04-07 (×3): qty 1

## 2012-04-07 MED ORDER — AMOXICILLIN-POT CLAVULANATE 875-125 MG PO TABS
1.0000 | ORAL_TABLET | Freq: Two times a day (BID) | ORAL | Status: DC
  Administered 2012-04-07 – 2012-04-13 (×13): 1 via ORAL
  Filled 2012-04-07 (×14): qty 1

## 2012-04-07 MED ORDER — RANOLAZINE ER 500 MG PO TB12
500.0000 mg | ORAL_TABLET | Freq: Two times a day (BID) | ORAL | Status: DC
  Administered 2012-04-07 – 2012-04-13 (×13): 500 mg via ORAL
  Filled 2012-04-07 (×14): qty 1

## 2012-04-07 MED ORDER — ALTEPLASE 100 MG IV SOLR
2.0000 mg | Freq: Once | INTRAVENOUS | Status: AC
  Administered 2012-04-07: 2 mg
  Filled 2012-04-07: qty 2

## 2012-04-07 MED ORDER — CHLORHEXIDINE GLUCONATE 0.12 % MT SOLN
15.0000 mL | Freq: Two times a day (BID) | OROMUCOSAL | Status: DC
  Administered 2012-04-07 (×2): 15 mL via OROMUCOSAL
  Filled 2012-04-07 (×3): qty 15

## 2012-04-07 NOTE — Progress Notes (Signed)
Nurse wasted approximately 3 mg of Morphine in sink when changing out Morphine syringe for PCA pump. Witness:  Marta Lamas.

## 2012-04-07 NOTE — Progress Notes (Signed)
PT overnight pulse ox study completed. Info downloaded and placed in PT chart. RT will continue to monitor.

## 2012-04-07 NOTE — Progress Notes (Signed)
Subjective:  No chest pain or SOB  Objective:  Vital Signs in the last 24 hours: Temp:  [97.1 F (36.2 C)-98.8 F (37.1 C)] 98.4 F (36.9 C) (09/06 0805) Pulse Rate:  [72-85] 85  (09/06 0950) Resp:  [12-19] 13  (09/06 0805) BP: (118-161)/(58-72) 161/72 mmHg (09/06 0950) SpO2:  [99 %-100 %] 99 % (09/06 0805) Weight:  [127.6 kg (281 lb 4.9 oz)] 127.6 kg (281 lb 4.9 oz) (09/06 0103)  Intake/Output from previous day:  Intake/Output Summary (Last 24 hours) at 04/07/12 1011 Last data filed at 04/07/12 1000  Gross per 24 hour  Intake   1130 ml  Output   2500 ml  Net  -1370 ml    Physical Exam: General appearance: alert, cooperative and no distress Lungs: clear to auscultation bilaterally Heart: regular rate and rhythm   Rate: 85  Rhythm: normal sinus rhythm  Lab Results:  Basename 04/06/12 1140 04/05/12 0400  WBC 13.7* 17.2*  HGB 8.3* 8.1*  PLT 337 322    Basename 04/06/12 0435 04/05/12 0400  NA 134* 137  K 3.7 3.6  CL 101 104  CO2 24 23  GLUCOSE 224* 115*  BUN 9 10  CREATININE 0.95 1.12*    Basename 04/04/12 2355 04/04/12 1754  TROPONINI 2.52* 2.62*   Hepatic Function Panel No results found for this basename: PROT,ALBUMIN,AST,ALT,ALKPHOS,BILITOT,BILIDIR,IBILI in the last 72 hours No results found for this basename: CHOL in the last 72 hours No results found for this basename: INR in the last 72 hours  Imaging: No results found.  Cardiac Studies:  Assessment/Plan:   Principal Problem:  *Sepsis due to undetermined organism without resultant organ failure Active Problems:  CAD, cath 2010, turned down for CABG "poor targets"  NSTEMI  post-op 04/04/12  Diabetes mellitus type 2, uncontrolled  Gangrenous Perirectal abscess s/p I/D and debridement 9/2  History of chronic Iron defeicency anemia with acute post op anemia, transfused after debridment 04/04/12  Proliferative diabetic retinopathy  HTN, on multiple medications prior to admission.  GERD  Obesity,  morbid  Presumed OSA (obstructive sleep apnea)  Bacterial vaginosis   Plan- She is stable from a cardiac standpoint  S/P peri-op NSTEMI. She has known severe CAD with plans for medical Rx only. She is not having angina now.  Her B/P is still a little elevated, will resume HCTZ. We will see her in follow up after discharge. Please call with any questions.   Kerin Ransom PA-C 04/07/2012, 10:11 AM  Pt. Seen & examined along with Mr. Rosalyn Gess, [place].  I agree with his findings, exam & recommendations. Peri-operative NSTEMI (Type 2 MI) with severe underlying MV CAD.  Preserved EF on Echo.   -- no active cardiac symptoms.  No angina or CHF. Now Hypertensive - recovering from Sepsis.   Additional Recommendations: Increased ARB back to home dose of 100mg  Restarted Ranexa 500mg  bid (was presumably on this @ home) Restarted Vytorin.  Leonie Man, M.D., M.S. [facility] [address] [place], [place]  [address]  [phone] Pager # [phone]  04/07/2012 11:42 AM

## 2012-04-07 NOTE — Progress Notes (Signed)
Nurse attempted to ambulate pt to bedside commode.  Pt was unable to tolerate ambulation and became extremely dizzy.  MD notified.

## 2012-04-07 NOTE — Progress Notes (Signed)
Patient ID: [name], female   DOB: 01-01-1956, 56 y.o.   MRN: [id number] Patient ID: [name], female   DOB: [birth year], 56 y.o.   MRN: [id number] Patient ID: [name], female   DOB: 19-Sep-1955, 56 y.o.   MRN: [id number] 4 Days Post-Op  Subjective: She appears comfortable this morning, no new c/o.   Objective: Vital signs in last 24 hours: Temp:  [97.1 F (36.2 C)-98.8 F (37.1 C)] 98.7 F (37.1 C) (09/06 0439) Pulse Rate:  [72-77] 75  (09/06 0439) Resp:  [12-19] 18  (09/06 0452) BP: (118-156)/(58-72) 153/65 mmHg (09/06 0439) SpO2:  [100 %] 100 % (09/06 0452) Weight:  [281 lb 4.9 oz (127.6 kg)] 281 lb 4.9 oz (127.6 kg) (09/06 0103) Last BM Date: 04/02/12  Intake/Output from previous day: 09/05 0701 - 09/06 0700 In: 1050 [P.O.:600; I.V.:450] Out: 1550 [Urine:1550] Intake/Output this shift:    General appearance: alert, cooperative, appears stated age, no distress and morbidly obese Chest: CTA bilaterally Cardiac: RRR Left Buttock wound: small amount of serous drainage on exterior abd pad. Serous drainage on gauze, wound edges appear well perfused. VSS,afebrile. WBC improving, afebrile.   Lab Results:   Basename 04/06/12 1140 04/05/12 0400  WBC 13.7* 17.2*  HGB 8.3* 8.1*  HCT 25.4* 24.1*  PLT 337 322   BMET  Basename 04/06/12 0435 04/05/12 0400  NA 134* 137  K 3.7 3.6  CL 101 104  CO2 24 23  GLUCOSE 224* 115*  BUN 9 10  CREATININE 0.95 1.12*  CALCIUM 8.4 8.1*   PT/INR No results found for this basename: LABPROT:2,INR:2 in the last 72 hours ABG No results found for this basename: PHART:2,PCO2:2,PO2:2,HCO3:2 in the last 72 hours  Studies/Results: No results found.  Anti-infectives: Anti-infectives     Start     Dose/Rate Route Frequency Ordered Stop   04/03/12 2200   piperacillin-tazobactam (ZOSYN) IVPB 3.375 g  Status:  Discontinued        3.375 g 12.5 mL/hr over 240 Minutes Intravenous 3 times per day 04/03/12 1712 04/05/12 2018   04/03/12 2000   clindamycin (CLEOCIN) IVPB 300 mg  Status:  Discontinued     Comments: Pharmacy may adjust dose      300 mg 100 mL/hr over 30 Minutes Intravenous Every 8 hours 04/03/12 1654 04/05/12 2018   04/03/12 1200   clindamycin (CLEOCIN) IVPB 600 mg  Status:  Discontinued        600 mg 100 mL/hr over 30 Minutes Intravenous  Once 04/03/12 1152 04/03/12 1716   04/03/12 1200   piperacillin-tazobactam (ZOSYN) IVPB 3.375 g  Status:  Discontinued        3.375 g 12.5 mL/hr over 240 Minutes Intravenous  Once 04/03/12 1152 04/05/12 2018   04/03/12 1100   vancomycin (VANCOCIN) 2,000 mg in sodium chloride 0.9 % 500 mL IVPB        2,000 mg 250 mL/hr over 120 Minutes Intravenous To Emergency Dept 04/03/12 0957 04/03/12 1334          Assessment/Plan:  Patient Active Problem List  Diagnosis  . DIABETES MELLITUS, TYPE II, ON INSULIN, UNCONTROLLED  . History of chronic Iron defeicency anemia with acute post op anemia, transfused after debridment 04/04/12  . Proliferative diabetic retinopathy  . VISUAL CHANGES  . HTN, on multiple medications prior to admission.  . CAD, cath 2010, turned down for CABG "poor targets"  . GERD  . MENORRHAGIA, PERIMENOPAUSAL  . SHOULDER PAIN, LEFT  . DIZZINESS  .  PERIPHERAL EDEMA  . DETACHED RETINA, BILATERAL, HX OF  . Chest pain at rest, secondary to viral syndrome.  negative MI  . Obesity, morbid  . Nausea and vomiting, secondary to viral syndrome  . Diabetes mellitus type 2, uncontrolled  . Sepsis due to undetermined organism without resultant organ failure  . Gangrenous Perirectal abscess s/p I/D and debridement 9/2  . NSTEMI  post-op 04/04/12  . Presumed OSA (obstructive sleep apnea)  . Bacterial vaginosis    s/p Procedure(s) (LRB): IRRIGATION AND DEBRIDEMENT PERIRECTAL ABSCESS (Left) 1. Continue with daily wet to dry dressing changes bid. 2. Continue  ABX 3. Management of DM and other medical issues per [facility] 4. Mobilize; PT  working with patient. 5. I will ask for home health consult in anticipation of patients discharge in future as she will need assistance with her wound care.   LOS: 4 days    [name] 04/07/2012

## 2012-04-07 NOTE — Progress Notes (Signed)
Doing well.  Wound care with wet to dry dressings.  Will likely need home health nursing for wound care when medical issues improved.

## 2012-04-07 NOTE — Consult Note (Addendum)
[facility] Consultation Note [place] TEAM 1 - Stepdown/ICU TEAM   [name] [id number] DOB: Aug 02, 1956 DOA: 04/03/2012 PCP: Aldona Bar, MD  Brief narrative: 56 year old female patient admitted by the surgical service on 04/03/2012 for gangrenous left buttock/perineal abscess. She was emergently taken to the OR on the same date for incision and drainage as well as extensive debridement.  Pulmonary critical care medicine was asked to assist with managing patient's comorbid medical illnesses since the surgical team felt the patient's surgical problems warrented observation in the ICU immediately postop. Patient stabilized enough from a surgical standpoint to move to the step down unit. On 04/06/2012 [facility] Team 1 assumed consultative medical care for this patient from the critical care medicine team.  Assessment/Plan:  Sepsis due to pansensitive Escherichia coli *Source presumed to be from abscess *Cultures from abscess demonstrate pansensitive Escherichia coli *Remains hemodynamically stable *abx per primary service (Gen Surgery)   NSTEMI  post-op 04/04/12/ CAD, cath 2010, turned down for CABG "poor targets" *[facility] is following *Continue Imdur.  *Was on Ranexa prior to admission but appears she was unable to afford this so plan is to arrange for samples of this medication (from cardiology) if she develops breakthrough chest pain *Low-dose aspirin 81 mg  *ECHO completed this admission-unfortunately images were inadequate to assess for wall motion abnormalities *Medical management only- not a surgical candidate  Grade 1 diastolic dysfunction *Currently appears to be compensated  Gangrenous Perirectal abscess s/p I/D and debridement 9/2 *Per primary team (Gen Surgery)   History of chronic Iron defeicency anemia with acute post op anemia, transfused after debridment 04/04/12 *Hemoglobin 8.1 on 04/05/2012 and repeat this morning 04/07/2012 is stable at  8.3  HTN, on multiple medications prior to admission. *Blood pressure moderately controlled *Given her financial situation unclear if patient was taking all of the medication she brought in with her at time of admission *decrease Toprol dosage only if heart rate drops below 70 - stopped Diltiazem but have resumed Cozaar.  Obesity, morbid/ Presumed OSA (obstructive sleep apnea) *Critical care medicine suspects the patient has underlying sleep apnea since she did not tolerate auto titration of CPAP during the hospitalization *Nocturnal trending pulse oximetry completed overnight 04/06/2012 to 04/07/2012 *I have spoken with Dr. Lake Bells pulmonologist to arrange for outpatient polysomnogram especially since the patient has Medicaid and no primary care physician. *Patient has not tolerated the CPAP mask during this hospitalization and therefore has not been utilizing-we'll reorder nocturnal CPAP and see if a smaller mask will work better for this patient  Diabetes mellitus type 2, uncontrolled *Patient was on 75/25 insulin twice a day at home  *Started on 70/30 insulin 60 units twice a day on 04/06/2012 but patient with low CBGs and poor oral intake therefore have decreased dosage to 30 units twice a day and we'll not start this until this afternoon. A.m. 70/30 dose held 04/07/2012 *Hemoglobin A1c elevated at 8.5  Clue cells on wet prep *Has been on IV clindamycin since 04/03/2012 which would also treat bacterial vaginosis   Proliferative diabetic retinopathy  GERD *Continue PPI  Limited access to outpatient healthcare *Patient reports she has Medicaid but has been unable to find an outpatient primary care physician who accepts Medicaid- appreciate Henrietta/case manager assistance in obtaining 2 physician names for the patient to followup to establish for primary care *She previously was an [facility] patient [facility]  DVT prophylaxis: Heparin subcutaneous 3 times a day Code Status:  Full Disposition Plan: At discretion of primary team  Consultants: [facility]  Procedures: Incision, drainage and sharp debridement left buttock and perineal abscess with findings of abscess and gaseous gangrene-04/03/2012 by Dr. Ninfa Linden  Cultures:  9/2 abscess culture>> multiple species  9/2 wet prep >> clue cells  9/2 blood culture >>  9/2 procalcitonin >>0.19  Antibiotics: 9/2 vanc >>9/2  9/2 Zosyn >> 9/4 9/2 clinda >>9/4   HPI/Subjective: Complaining of anorexia and this is confirmed by patient's poor oral intake. No other complaints verbalized.   Objective: Blood pressure 155/79, pulse 82, temperature 98.3 F (36.8 C), temperature source Oral, resp. rate 15, height 5\' 3"  (1.6 m), weight 127.6 kg (281 lb 4.9 oz), SpO2 100.00%.  Intake/Output Summary (Last 24 hours) at 04/07/12 1241 Last data filed at 04/07/12 1100  Gross per 24 hour  Intake   1080 ml  Output   2350 ml  Net  -1270 ml     Exam: General: No acute respiratory distress Lungs: Clear to auscultation bilaterally without wheezes or crackles Cardiovascular: Regular rate and rhythm without murmur gallop or rub normal S1 and S2 Abdomen: Nontender, nondistended, soft, bowel sounds positive, no rebound, no ascites, no appreciable mass Extremities: No significant cyanosis, clubbing, or edema bilateral lower extremities  Data Reviewed: Basic Metabolic Panel:  Lab [id number] 04/05/12 0400 04/04/12 0415 04/03/12 1729 04/03/12 1355 04/03/12 0740  NA 134* 137 133* -- 134* 130*  K 3.7 3.6 3.2* -- 3.6 3.4*  CL 101 104 102 -- -- 93*  CO2 24 23 22  -- -- 22  GLUCOSE 224* 115* 122* -- -- 361*  BUN 9 10 8  -- -- 15  CREATININE 0.95 1.12* 1.02 -- -- 1.26*  CALCIUM 8.4 8.1* 8.2* -- -- 9.1  MG -- -- 1.9 2.0 -- --  PHOS -- -- -- 1.9* -- --   CBC:  Lab 04/06/12 1140 04/05/12 0400 04/04/12 0415 04/03/12 1355 04/03/12 0740  WBC 13.7* 17.2* 21.5* -- 24.8*   NEUTROABS -- -- -- -- --  HGB 8.3* 8.1* 7.5* 7.5* 8.0*  HCT 25.4* 24.1* 22.9* 22.0* 24.2*  MCV 77.7* 77.5* 77.4* -- 76.3*  PLT 337 322 321 -- 342   Cardiac Enzymes:  Lab 04/04/12 2355 04/04/12 1754 04/04/12 1210 04/04/12 0415 04/03/12 1729  CKTOTAL -- -- -- -- --  CKMB -- -- -- -- --  CKMBINDEX -- -- -- -- --  TROPONINI 2.52* 2.62* 3.33* 2.00* 0.45*   CBG:  Lab 04/07/12 0807 04/07/12 0806 04/06/12 2142 04/06/12 1715 04/06/12 1135  GLUCAP 84 81 189* 198* 188*    Recent Results (from the past 240 hour(s))  WET PREP, GENITAL     Status: Abnormal   Collection Time   04/03/12  9:51 AM      Component Value Range Status Comment   Yeast Wet Prep HPF POC NONE SEEN  NONE SEEN Final    Trich, Wet Prep NONE SEEN  NONE SEEN Final    Clue Cells Wet Prep HPF POC MODERATE (*) NONE SEEN Final    WBC, Wet Prep HPF POC FEW (*) NONE SEEN Final   CULTURE, BLOOD (ROUTINE X 2)     Status: Normal (Preliminary result)   Collection Time   04/03/12 10:33 AM      Component Value Range Status Comment   Specimen Description BLOOD LEFT ARM   Final    Special Requests BOTTLES DRAWN AEROBIC AND ANAEROBIC 10CC   Final    Culture  Setup Time 04/03/2012 19:05   Final  Culture     Final    Value:        BLOOD CULTURE RECEIVED NO GROWTH TO DATE CULTURE WILL BE HELD FOR 5 DAYS BEFORE ISSUING A FINAL NEGATIVE REPORT   Report Status PENDING   Incomplete   CULTURE, BLOOD (ROUTINE X 2)     Status: Normal (Preliminary result)   Collection Time   04/03/12 10:45 AM      Component Value Range Status Comment   Specimen Description BLOOD LEFT HAND   Final    Special Requests BOTTLES DRAWN AEROBIC AND ANAEROBIC 5CC   Final    Culture  Setup Time 04/03/2012 19:06   Final    Culture     Final    Value:        BLOOD CULTURE RECEIVED NO GROWTH TO DATE CULTURE WILL BE HELD FOR 5 DAYS BEFORE ISSUING A FINAL NEGATIVE REPORT   Report Status PENDING   Incomplete   CULTURE, ROUTINE-ABSCESS     Status: Normal   Collection Time    04/03/12  2:01 PM      Component Value Range Status Comment   Specimen Description ABSCESS PERIRECTAL   Final    Special Requests PATIENT ON FOLLOWING VANCOMYCIN,ZOSYN,CLEOCIN   Final    Gram Stain     Final    Value: NO WBC SEEN     NO SQUAMOUS EPITHELIAL CELLS SEEN     FEW GRAM POSITIVE COCCI IN PAIRS     RARE GRAM POSITIVE RODS   Culture RARE ESCHERICHIA COLI   Final    Report Status 04/07/2012 FINAL   Final    Organism ID, Bacteria ESCHERICHIA COLI   Final   ANAEROBIC CULTURE     Status: Normal (Preliminary result)   Collection Time   04/03/12  2:01 PM      Component Value Range Status Comment   Specimen Description ABSCESS PERIRECTAL   Final    Special Requests PATIENT ON FOLLOWING VANCOMYCIN,ZOSYN,CLEOCIN   Final    Gram Stain PENDING   Incomplete    Culture     Final    Value: NO ANAEROBES ISOLATED; CULTURE IN PROGRESS FOR 5 DAYS   Report Status PENDING   Incomplete   MRSA PCR SCREENING     Status: Normal   Collection Time   04/03/12  6:28 PM      Component Value Range Status Comment   MRSA by PCR NEGATIVE  NEGATIVE Final      Studies:  Recent x-ray studies have been reviewed in detail by the Attending Physician  Scheduled Meds:  Reviewed in detail by the Attending Physician   Erin Hearing, ANP [facility] Office  [phone] Pager [phone]  On-Call/Text Page:      [email or web address]      password [email or web address]  If 7PM-7AM, please contact night-coverage [email or web address] Password [email or web address] 04/07/2012, 12:41 PM   LOS: 4 days   I have personally examined this patient and reviewed the entire database. I have reviewed the above note, made any necessary editorial changes, and agree with its content.  Cherene Altes, MD [facility]

## 2012-04-07 NOTE — Progress Notes (Signed)
PT has refused CPAP for the night. PT says since she wore it last night she would like to take a break tonight and only use her nasal cannula. PT is currently 100% on 2 lpm nasal cannula with no distress. RT will continue to monitor.

## 2012-04-08 DIAGNOSIS — K612 Anorectal abscess: Secondary | ICD-10-CM

## 2012-04-08 LAB — GLUCOSE, CAPILLARY
Glucose-Capillary: 167 mg/dL — ABNORMAL HIGH (ref 70–99)
Glucose-Capillary: 153 mg/dL — ABNORMAL HIGH (ref 70–99)

## 2012-04-08 LAB — ANAEROBIC CULTURE

## 2012-04-08 MED ORDER — INSULIN ASPART PROT & ASPART (70-30 MIX) 100 UNIT/ML ~~LOC~~ SUSP
40.0000 [IU] | Freq: Two times a day (BID) | SUBCUTANEOUS | Status: DC
  Administered 2012-04-08 – 2012-04-09 (×2): 40 [IU] via SUBCUTANEOUS
  Filled 2012-04-08: qty 10

## 2012-04-08 MED ORDER — CYANOCOBALAMIN 1000 MCG/ML IJ SOLN
1000.0000 ug | Freq: Once | INTRAMUSCULAR | Status: AC
  Administered 2012-04-08: 1000 ug via SUBCUTANEOUS
  Filled 2012-04-08: qty 1

## 2012-04-08 MED ORDER — ISOSORBIDE MONONITRATE ER 60 MG PO TB24
60.0000 mg | ORAL_TABLET | Freq: Every day | ORAL | Status: DC
  Administered 2012-04-09: 60 mg via ORAL
  Filled 2012-04-08: qty 1

## 2012-04-08 MED ORDER — INSULIN ASPART PROT & ASPART (70-30 MIX) 100 UNIT/ML ~~LOC~~ SUSP
40.0000 [IU] | Freq: Two times a day (BID) | SUBCUTANEOUS | Status: DC
  Filled 2012-04-08: qty 3

## 2012-04-08 MED ORDER — HYDROCHLOROTHIAZIDE 12.5 MG PO CAPS
25.0000 mg | ORAL_CAPSULE | Freq: Every day | ORAL | Status: DC
  Administered 2012-04-09 – 2012-04-13 (×5): 25 mg via ORAL
  Filled 2012-04-08 (×5): qty 2

## 2012-04-08 MED ORDER — DOCUSATE SODIUM 100 MG PO CAPS
100.0000 mg | ORAL_CAPSULE | Freq: Every day | ORAL | Status: DC | PRN
  Administered 2012-04-08 – 2012-04-09 (×2): 100 mg via ORAL
  Filled 2012-04-08 (×2): qty 1

## 2012-04-08 NOTE — Progress Notes (Signed)
Patient ID: MACKENZI OSIO, female   DOB: [birth year], 56 y.o.   MRN: [id number] Patient ID: JOSEPHENE PLAUTZ, female   DOB: August 23, 1955, 56 y.o.   MRN: [id number] Patient ID: MALIK OAKLAND, female   DOB: Nov 13, 1955, 56 y.o.   MRN: [id number] Patient ID: SHAVONDA RISI, female   DOB: July 11, 1956, 56 y.o.   MRN: [id number] 5 Days Post-Op  Subjective: She appears comfortable this morning, no new c/o.   Objective: Vital signs in last 24 hours: Temp:  [98.3 F (36.8 C)-98.6 F (37 C)] 98.6 F (37 C) (09/07 0900) Pulse Rate:  [75-85] 85  (09/07 0900) Resp:  [12-18] 14  (09/07 0900) BP: (132-160)/(58-79) 160/74 mmHg (09/07 0900) SpO2:  [100 %] 100 % (09/07 0900) Weight:  [279 lb 15.8 oz (127 kg)] 279 lb 15.8 oz (127 kg) (09/07 0504) Last BM Date: 04/02/12  Intake/Output from previous day: 09/06 0701 - 09/07 0700 In: 1020 [P.O.:520; I.V.:500] Out: 2700 [Urine:2700] Intake/Output this shift: Total I/O In: 42 [I.V.:42] Out: 550 [Urine:550]  General appearance: alert, cooperative, appears stated age, no distress and morbidly obese Chest: CTA bilaterally Cardiac: RRR Left Buttock wound: small amount of serous drainage on exterior abd pad. Serous drainage on gauze, wound edges appear well perfused. Some tannish appearing drainage today inside wound , no odor will continue to monitor this, continue with wet to dry for now. VSS,afebrile.    Lab Results:   Basename 04/06/12 1140  WBC 13.7*  HGB 8.3*  HCT 25.4*  PLT 337   BMET  Basename 04/06/12 0435  NA 134*  K 3.7  CL 101  CO2 24  GLUCOSE 224*  BUN 9  CREATININE 0.95  CALCIUM 8.4   PT/INR No results found for this basename: LABPROT:2,INR:2 in the last 72 hours ABG No results found for this basename: PHART:2,PCO2:2,PO2:2,HCO3:2 in the last 72 hours  Studies/Results: No results found.  Anti-infectives: Anti-infectives     Start     Dose/Rate Route Frequency Ordered Stop   04/07/12 1545   amoxicillin-clavulanate  (AUGMENTIN) 875-125 MG per tablet 1 tablet        1 tablet Oral Every 12 hours 04/07/12 1517     04/03/12 2200   piperacillin-tazobactam (ZOSYN) IVPB 3.375 g  Status:  Discontinued        3.375 g 12.5 mL/hr over 240 Minutes Intravenous 3 times per day 04/03/12 1712 04/05/12 2018   04/03/12 2000   clindamycin (CLEOCIN) IVPB 300 mg  Status:  Discontinued     Comments: Pharmacy may adjust dose      300 mg 100 mL/hr over 30 Minutes Intravenous Every 8 hours 04/03/12 1654 04/05/12 2018   04/03/12 1200   clindamycin (CLEOCIN) IVPB 600 mg  Status:  Discontinued        600 mg 100 mL/hr over 30 Minutes Intravenous  Once 04/03/12 1152 04/03/12 1716   04/03/12 1200   piperacillin-tazobactam (ZOSYN) IVPB 3.375 g  Status:  Discontinued        3.375 g 12.5 mL/hr over 240 Minutes Intravenous  Once 04/03/12 1152 04/05/12 2018   04/03/12 1100   vancomycin (VANCOCIN) 2,000 mg in sodium chloride 0.9 % 500 mL IVPB        2,000 mg 250 mL/hr over 120 Minutes Intravenous To Emergency Dept 04/03/12 0957 04/03/12 1334          Assessment/Plan:  Patient Active Problem List  Diagnosis  . DIABETES MELLITUS, TYPE II, ON INSULIN, UNCONTROLLED  . History of chronic  Iron defeicency anemia with acute post op anemia, transfused after debridment 04/04/12  . Proliferative diabetic retinopathy  . VISUAL CHANGES  . HTN, on multiple medications prior to admission.  . CAD, cath 2010, turned down for CABG "poor targets"  . GERD  . MENORRHAGIA, PERIMENOPAUSAL  . SHOULDER PAIN, LEFT  . DIZZINESS  . PERIPHERAL EDEMA  . DETACHED RETINA, BILATERAL, HX OF  . Chest pain at rest, secondary to viral syndrome.  negative MI  . Obesity, morbid  . Nausea and vomiting, secondary to viral syndrome  . Diabetes mellitus type 2, uncontrolled  . Sepsis due to Escherichia coli  . Gangrenous Perirectal abscess s/p I/D and debridement 9/2  . NSTEMI  post-op 04/04/12  . Presumed OSA (obstructive sleep apnea)  . Bacterial vaginosis   . Chronic diastolic heart failure, NYHA class 1    s/p Procedure(s) (LRB): IRRIGATION AND DEBRIDEMENT PERIRECTAL ABSCESS (Left) 1. Continue with daily wet to dry dressing changes bid. If drainage worsens will consider adding hydrotherapy. 2. Continue  ABX 3. Management of DM and other medical issues per [facility] 4. Mobilize; PT working with patient.   LOS: 5 days    Ethlyn Alto 04/08/2012

## 2012-04-08 NOTE — Progress Notes (Signed)
Brief Consult F/U Note  Chart reviwed at length.  Elevated CBG and persistent HTN noted.  Will make adjustments in med tx plan.    Otherwise stable from medical standpoint.  Will f/u will full visit in AM.  Cherene Altes, MD [facility] Office  [phone] Pager [phone]  On-Call/Text Page:      [email or web address]      password [facility]

## 2012-04-08 NOTE — Progress Notes (Signed)
Pt complaining of not having a BM since 9/1; pt would like something to help her have a BM.  Contacted Robinette Haines, NP in regards to this.  Orders were received.  Loleta Dicker, RN

## 2012-04-08 NOTE — Progress Notes (Signed)
Pt refuses to wear CPAP at this time. Pt has attempted to wear mask previously and did not tolerate well. No distress noted.

## 2012-04-08 NOTE — Progress Notes (Signed)
Agree with above 

## 2012-04-08 NOTE — Progress Notes (Signed)
Wasted 4mg  morphine IV with Earlie Server, RN from pt PCA pump.  Pump alerted that there was little left, so replaced with new syringe of 25 mg morphine with Maudie Mercury, Therapist, sports.  Pt WNL.    Loleta Dicker, RN

## 2012-04-09 DIAGNOSIS — I5032 Chronic diastolic (congestive) heart failure: Secondary | ICD-10-CM

## 2012-04-09 LAB — CULTURE, BLOOD (ROUTINE X 2): Culture: NO GROWTH

## 2012-04-09 LAB — GLUCOSE, CAPILLARY
Glucose-Capillary: 83 mg/dL (ref 70–99)
Glucose-Capillary: 85 mg/dL (ref 70–99)
Glucose-Capillary: 76 mg/dL (ref 70–99)

## 2012-04-09 MED ORDER — ISOSORBIDE MONONITRATE ER 30 MG PO TB24
30.0000 mg | ORAL_TABLET | Freq: Every day | ORAL | Status: DC
  Administered 2012-04-10 – 2012-04-13 (×4): 30 mg via ORAL
  Filled 2012-04-09 (×4): qty 1

## 2012-04-09 MED ORDER — INSULIN ASPART PROT & ASPART (70-30 MIX) 100 UNIT/ML ~~LOC~~ SUSP
36.0000 [IU] | Freq: Two times a day (BID) | SUBCUTANEOUS | Status: DC
  Administered 2012-04-09 – 2012-04-12 (×7): 36 [IU] via SUBCUTANEOUS
  Filled 2012-04-09: qty 3

## 2012-04-09 MED ORDER — INSULIN ASPART PROT & ASPART (70-30 MIX) 100 UNIT/ML ~~LOC~~ SUSP
36.0000 [IU] | Freq: Two times a day (BID) | SUBCUTANEOUS | Status: DC
  Filled 2012-04-09: qty 3

## 2012-04-09 MED ORDER — HYDROCODONE-ACETAMINOPHEN 5-325 MG PO TABS
1.0000 | ORAL_TABLET | ORAL | Status: DC | PRN
  Administered 2012-04-09: 1 via ORAL
  Administered 2012-04-10 – 2012-04-12 (×7): 2 via ORAL
  Administered 2012-04-13: 1 via ORAL
  Administered 2012-04-13: 2 via ORAL
  Filled 2012-04-09 (×4): qty 2
  Filled 2012-04-09: qty 1
  Filled 2012-04-09: qty 2
  Filled 2012-04-09: qty 1
  Filled 2012-04-09 (×3): qty 2

## 2012-04-09 NOTE — Progress Notes (Signed)
Agree with above.  [name]. Georgette Dover, MD, [facility]  04/09/2012 3:51 PM

## 2012-04-09 NOTE — Progress Notes (Signed)
Patient ID: LARESA CRAMBLIT, female   DOB: 13-Aug-1955, 56 y.o.   MRN: [id number] Patient ID: ADDINE MAURITZ, female   DOB: 27-Jan-1956, 56 y.o.   MRN: [id number] Patient ID: QUINNE MINION, female   DOB: 04-Feb-1956, 56 y.o.   MRN: [id number] Patient ID: KYNSEY SUSKO, female   DOB: 06-08-1956, 56 y.o.   MRN: [id number] Patient ID: FATUMATA KOLTUN, female   DOB: 12-29-1955, 56 y.o.   MRN: [id number] 6 Days Post-Op  Subjective: She appears comfortable, had been c/o of leg pain "shaking" earlier? Related to BG (80's) Resolved now full rom and strength. Objective: Vital signs in last 24 hours: Temp:  [97.8 F (36.6 C)-98.9 F (37.2 C)] 98.4 F (36.9 C) (09/08 1131) Pulse Rate:  [79-114] 110  (09/08 1131) Resp:  [11-21] 12  (09/08 1200) BP: (141-188)/(67-96) 173/88 mmHg (09/08 1131) SpO2:  [100 %] 100 % (09/08 1200) Weight:  [275 lb 9.2 oz (125 kg)] 275 lb 9.2 oz (125 kg) (09/08 0437) Last BM Date: 04/02/12  Intake/Output from previous day: 09/07 0701 - 09/08 0700 In: 981 [P.O.:720; I.V.:261] Out: 3300 [Urine:3300] Intake/Output this shift: Total I/O In: 386 [P.O.:240; I.V.:146] Out: 1000 [Urine:1000]  General appearance: alert, cooperative, appears stated age, no distress and morbidly obese Chest: CTA bilaterally Cardiac: RRR Left Buttock wound: dry exterior abd pad. Small amount of serous drainage on gauze, wound edges appear well perfused. continue with wet to dry for now. Start Sitz baths tomorrow. VSS,afebrile.    Lab Results:  No results found for this basename: WBC:2,HGB:2,HCT:2,PLT:2 in the last 72 hours BMET No results found for this basename: NA:2,K:2,CL:2,CO2:2,GLUCOSE:2,BUN:2,CREATININE:2,CALCIUM:2 in the last 72 hours PT/INR No results found for this basename: LABPROT:2,INR:2 in the last 72 hours ABG No results found for this basename: PHART:2,PCO2:2,PO2:2,HCO3:2 in the last 72 hours  Studies/Results: No results found.  Anti-infectives: Anti-infectives    Start     Dose/Rate Route Frequency Ordered Stop   04/07/12 1545   amoxicillin-clavulanate (AUGMENTIN) 875-125 MG per tablet 1 tablet        1 tablet Oral Every 12 hours 04/07/12 1517     04/03/12 2200   piperacillin-tazobactam (ZOSYN) IVPB 3.375 g  Status:  Discontinued        3.375 g 12.5 mL/hr over 240 Minutes Intravenous 3 times per day 04/03/12 1712 04/05/12 2018   04/03/12 2000   clindamycin (CLEOCIN) IVPB 300 mg  Status:  Discontinued     Comments: Pharmacy may adjust dose      300 mg 100 mL/hr over 30 Minutes Intravenous Every 8 hours 04/03/12 1654 04/05/12 2018   04/03/12 1200   clindamycin (CLEOCIN) IVPB 600 mg  Status:  Discontinued        600 mg 100 mL/hr over 30 Minutes Intravenous  Once 04/03/12 1152 04/03/12 1716   04/03/12 1200   piperacillin-tazobactam (ZOSYN) IVPB 3.375 g  Status:  Discontinued        3.375 g 12.5 mL/hr over 240 Minutes Intravenous  Once 04/03/12 1152 04/05/12 2018   04/03/12 1100   vancomycin (VANCOCIN) 2,000 mg in sodium chloride 0.9 % 500 mL IVPB        2,000 mg 250 mL/hr over 120 Minutes Intravenous To Emergency Dept 04/03/12 0957 04/03/12 1334          Assessment/Plan:  Patient Active Problem List  Diagnosis  . DIABETES MELLITUS, TYPE II, ON INSULIN, UNCONTROLLED  . History of chronic Iron defeicency anemia with acute post op anemia, transfused after debridment  04/04/12  . Proliferative diabetic retinopathy  . VISUAL CHANGES  . HTN, on multiple medications prior to admission.  . CAD, cath 2010, turned down for CABG "poor targets"  . GERD  . MENORRHAGIA, PERIMENOPAUSAL  . SHOULDER PAIN, LEFT  . DIZZINESS  . PERIPHERAL EDEMA  . DETACHED RETINA, BILATERAL, HX OF  . Chest pain at rest, secondary to viral syndrome.  negative MI  . Obesity, morbid  . Nausea and vomiting, secondary to viral syndrome  . Diabetes mellitus type 2, uncontrolled  . Sepsis due to Escherichia coli  . Gangrenous Perirectal abscess s/p I/D and debridement 9/2    . NSTEMI  post-op 04/04/12  . Presumed OSA (obstructive sleep apnea)  . Bacterial vaginosis  . Chronic diastolic heart failure, NYHA class 1    s/p Procedure(s) (LRB): IRRIGATION AND DEBRIDEMENT PERIRECTAL ABSCESS (Left) 1. Continue with daily wet to dry dressing changes bid. Will start Sitz baths tomorrow. 2. Continue  ABX Recheck CBC in am. 3. Management of DM and other medical issues per [facility] 4. Mobilize; PT working with patient. 5. OOB 6. DC foley 7. Bedside commode   LOS: 6 days    Dakwan Pridgen 04/09/2012

## 2012-04-09 NOTE — Progress Notes (Signed)
Patient refuses CPAP at this time.  

## 2012-04-09 NOTE — Progress Notes (Signed)
Attempted to assist pt to use the [facility]; pt got to the side of the bed and said she felt dizzy and was "seeing spots".  BP 188/92, HR 89.  Paged Tsuei, MD in regards to this situation.  Was told to let Ysidro Evert, NP know when he rounds this morning.  Pt currently laying in bed with no dizziness (1030).  Will continue to monitor.  Loleta Dicker, RN

## 2012-04-09 NOTE — Consult Note (Signed)
[facility] Consultation Note [place] TEAM 1 - Stepdown/ICU TEAM   [name] [id number] DOB: 1955/10/05 DOA: 04/03/2012 PCP: Aldona Bar, MD  Brief narrative: 56 year old female patient admitted by the surgical service on 04/03/2012 for gangrenous left buttock/perineal abscess. She was emergently taken to the OR on the same date for incision and drainage as well as extensive debridement.  Pulmonary critical care medicine was asked to assist with managing patient's comorbid medical illnesses since the surgical team felt the patient's surgical problems warrented observation in the ICU immediately postop. Patient stabilized enough from a surgical standpoint to move to the step down unit. On 04/06/2012 [facility] Team 1 assumed consultative medical care for this patient from the critical care medicine team.  Assessment/Plan:  Unrelenting nausea Likely related to augmentin tx + ongoing narcotic PCA   Sepsis due to pansensitive Escherichia coli *Source presumed to be from abscess *Cultures from abscess demonstrate pansensitive Escherichia coli *Remains hemodynamically stable *abx per primary service (Gen Surgery)  NSTEMI  post-op 04/04/12/ CAD, cath 2010, turned down for CABG "poor targets" *[facility] has been following *Continue Imdur *Was on Ranexa prior to admission but appears she was unable to afford this so plan is to arrange for samples of this medication (from cardiology) if she develops breakthrough chest pain *Low-dose aspirin 81 mg  *ECHO completed this admission - unfortunately images were inadequate to assess for wall motion abnormalities *Medical management only- not a surgical candidate  Grade 1 diastolic dysfunction *Currently appears to be compensated  Gangrenous Perirectal abscess s/p I/D and debridement 9/2 *Per primary team (Gen Surgery)   History of chronic Iron defeicency anemia with acute post op anemia, transfused after debridment  04/04/12 *Hemoglobin 8.1 on 04/05/2012 - recheck in AM  HTN, on multiple medications prior to admission. *Blood pressure not well controlled - titrate meds further today *Given her financial situation unclear if patient was taking all of the medication she brought in with her at time of admission  Obesity, morbid/ Presumed OSA (obstructive sleep apnea) *Critical care medicine suspects the patient has underlying sleep apnea since she did not tolerate auto titration of CPAP during the hospitalization *Nocturnal trending pulse oximetry completed overnight 04/06/2012 to 04/07/2012 *I have spoken with Dr. Lake Bells pulmonologist to arrange for outpatient polysomnogram especially since the patient has Medicaid and no primary care physician. *Patient has not tolerated the CPAP mask during this hospitalization and therefore has not been utilizing-we'll reorder nocturnal CPAP and see if a smaller mask will work better for this patient  Diabetes mellitus type 2, uncontrolled *Patient was on 75/25 insulin twice a day at home  *continue to titrate tx dose - difficult given unreliable/inconsistent intake *Hemoglobin A1c elevated at 8.5  Clue cells on wet prep *Has been tx w/  IV clindamycin   Proliferative diabetic retinopathy  GERD *Continue PPI  Limited access to outpatient healthcare *Patient reports she has Medicaid but has been unable to find an outpatient primary care physician who accepts Medicaid- appreciate Henrietta/case manager assistance in obtaining 2 physician names for the patient to followup to establish for primary care *She previously was an [facility] patient [facility]  DVT prophylaxis: Heparin subcutaneous Code Status: Full Disposition Plan: At discretion of primary team  Consultants: [facility]  Procedures: Incision, drainage and sharp debridement left buttock and perineal abscess with findings of abscess and gaseous  gangrene-04/03/2012 by Dr. Ninfa Linden  Cultures:  9/2 abscess culture>> multiple species  9/2 wet prep >> clue  cells  9/2 blood culture >>  9/2 procalcitonin >>0.19  Antibiotics: 9/2 vanc >>9/2  9/2 Zosyn >> 9/4 9/2 clinda >>9/4 augmentin 9/6>>  HPI/Subjective: Complaining of anorexia and ongoing nausea.  Denies cp, f/c, or signif sob.     Objective: Blood pressure 173/88, pulse 110, temperature 98.4 F (36.9 C), temperature source Axillary, resp. rate 12, height 5\' 3"  (1.6 m), weight 125 kg (275 lb 9.2 oz), SpO2 100.00%.  Intake/Output Summary (Last 24 hours) at 04/09/12 1404 Last data filed at 04/09/12 1300  Gross per 24 hour  Intake    740 ml  Output   2850 ml  Net  -2110 ml     Exam: General: No acute respiratory distress at rest  Lungs: distant bs th/o - mild bibasilar crackles - no wheeze  Cardiovascular: Regular rate and rhythm without murmur gallop or rub normal S1 and S2 Abdomen: morbidly obese, soft, bs+, no rebound Extremities: 1+ B LE edema w/o cyanosis or clubbing  Data Reviewed: Basic Metabolic Panel:  Lab [id number] 04/05/12 0400 04/04/12 0415 04/03/12 1729 04/03/12 1355 04/03/12 0740  NA 134* 137 133* -- 134* 130*  K 3.7 3.6 3.2* -- 3.6 3.4*  CL 101 104 102 -- -- 93*  CO2 24 23 22  -- -- 22  GLUCOSE 224* 115* 122* -- -- 361*  BUN 9 10 8  -- -- 15  CREATININE 0.95 1.12* 1.02 -- -- 1.26*  CALCIUM 8.4 8.1* 8.2* -- -- 9.1  MG -- -- 1.9 2.0 -- --  PHOS -- -- -- 1.9* -- --   CBC:  Lab 04/06/12 1140 04/05/12 0400 04/04/12 0415 04/03/12 1355 04/03/12 0740  WBC 13.7* 17.2* 21.5* -- 24.8*  NEUTROABS -- -- -- -- --  HGB 8.3* 8.1* 7.5* 7.5* 8.0*  HCT 25.4* 24.1* 22.9* 22.0* 24.2*  MCV 77.7* 77.5* 77.4* -- 76.3*  PLT 337 322 321 -- 342   Cardiac Enzymes:  Lab 04/04/12 2355 04/04/12 1754 04/04/12 1210 04/04/12 0415 04/03/12 1729  CKTOTAL -- -- -- -- --  CKMB -- -- -- -- --  CKMBINDEX -- -- -- -- --  TROPONINI 2.52* 2.62* 3.33* 2.00* 0.45*    CBG:  Lab 04/09/12 1208 04/09/12 0833 04/08/12 2115 04/08/12 1742 04/08/12 1212  GLUCAP 83 97 101* 185* 153*    Recent Results (from the past 240 hour(s))  WET PREP, GENITAL     Status: Abnormal   Collection Time   04/03/12  9:51 AM      Component Value Range Status Comment   Yeast Wet Prep HPF POC NONE SEEN  NONE SEEN Final    Trich, Wet Prep NONE SEEN  NONE SEEN Final    Clue Cells Wet Prep HPF POC MODERATE (*) NONE SEEN Final    WBC, Wet Prep HPF POC FEW (*) NONE SEEN Final   CULTURE, BLOOD (ROUTINE X 2)     Status: Normal   Collection Time   04/03/12 10:33 AM      Component Value Range Status Comment   Specimen Description BLOOD LEFT ARM   Final    Special Requests BOTTLES DRAWN AEROBIC AND ANAEROBIC 10CC   Final    Culture  Setup Time 04/03/2012 19:05   Final    Culture NO GROWTH 5 DAYS   Final    Report Status 04/09/2012 FINAL   Final   CULTURE, BLOOD (ROUTINE X 2)     Status: Normal   Collection Time   04/03/12 10:45 AM  Component Value Range Status Comment   Specimen Description BLOOD LEFT HAND   Final    Special Requests BOTTLES DRAWN AEROBIC AND ANAEROBIC 5CC   Final    Culture  Setup Time 04/03/2012 19:06   Final    Culture NO GROWTH 5 DAYS   Final    Report Status 04/09/2012 FINAL   Final   CULTURE, ROUTINE-ABSCESS     Status: Normal   Collection Time   04/03/12  2:01 PM      Component Value Range Status Comment   Specimen Description ABSCESS PERIRECTAL   Final    Special Requests PATIENT ON FOLLOWING VANCOMYCIN,ZOSYN,CLEOCIN   Final    Gram Stain     Final    Value: NO WBC SEEN     NO SQUAMOUS EPITHELIAL CELLS SEEN     FEW GRAM POSITIVE COCCI IN PAIRS     RARE GRAM POSITIVE RODS   Culture RARE ESCHERICHIA COLI   Final    Report Status 04/07/2012 FINAL   Final    Organism ID, Bacteria ESCHERICHIA COLI   Final   ANAEROBIC CULTURE     Status: Normal   Collection Time   04/03/12  2:01 PM      Component Value Range Status Comment   Specimen Description ABSCESS  PERIRECTAL   Final    Special Requests PATIENT ON FOLLOWING VANCOMYCIN,ZOSYN,CLEOCIN   Final    Gram Stain     Final    Value: NO WBC SEEN     NO SQUAMOUS EPITHELIAL CELLS SEEN     FEW GRAM POSITIVE COCCI IN PAIRS     FEW GRAM POSITIVE RODS   Culture NO ANAEROBES ISOLATED   Final    Report Status 04/08/2012 FINAL   Final   MRSA PCR SCREENING     Status: Normal   Collection Time   04/03/12  6:28 PM      Component Value Range Status Comment   MRSA by PCR NEGATIVE  NEGATIVE Final      Studies:  Recent x-ray studies have been reviewed in detail by the Attending Physician  Scheduled Meds:  Reviewed in detail by the Attending Physician   Cherene Altes, MD [facility] Office  [phone] Pager [phone]  On-Call/Text Page:      [email or web address]      password [facility]  04/09/2012, 2:04 PM   LOS: 6 days

## 2012-04-10 LAB — CBC
HCT: 24.9 % — ABNORMAL LOW (ref 36.0–46.0)
Hemoglobin: 8.2 g/dL — ABNORMAL LOW (ref 12.0–15.0)
MCHC: 32.9 g/dL (ref 30.0–36.0)
RBC: 3.22 MIL/uL — ABNORMAL LOW (ref 3.87–5.11)
WBC: 13.2 10*3/uL — ABNORMAL HIGH (ref 4.0–10.5)

## 2012-04-10 LAB — BASIC METABOLIC PANEL
BUN: 8 mg/dL (ref 6–23)
Chloride: 98 mEq/L (ref 96–112)
GFR calc non Af Amer: 64 mL/min — ABNORMAL LOW (ref >90)
Glucose, Bld: 92 mg/dL (ref 70–99)
Potassium: 3.2 mEq/L — ABNORMAL LOW (ref 3.5–5.1)
Sodium: 134 mEq/L — ABNORMAL LOW (ref 135–145)

## 2012-04-10 LAB — GLUCOSE, CAPILLARY
Glucose-Capillary: 190 mg/dL — ABNORMAL HIGH (ref 70–99)
Glucose-Capillary: 81 mg/dL (ref 70–99)
Glucose-Capillary: 74 mg/dL (ref 70–99)

## 2012-04-10 MED ORDER — POTASSIUM CHLORIDE CRYS ER 20 MEQ PO TBCR
20.0000 meq | EXTENDED_RELEASE_TABLET | Freq: Every day | ORAL | Status: DC
  Administered 2012-04-10 – 2012-04-13 (×4): 20 meq via ORAL
  Filled 2012-04-10 (×4): qty 1

## 2012-04-10 NOTE — Consult Note (Signed)
[facility] Consultation Note  TEAM 1 - Stepdown/ICU TEAM   [name] [id number] DOB: 12-28-1955 DOA: 04/03/2012 PCP: Aldona Bar, MD  Brief narrative: 56 year old female patient admitted by the surgical service on 04/03/2012 for gangrenous left buttock/perineal abscess. She was emergently taken to the OR on the same date for incision and drainage as well as extensive debridement.  Pulmonary critical care medicine was asked to assist with managing patient's comorbid medical illnesses since the surgical team felt the patient's surgical problems warrented observation in the ICU immediately postop. Patient stabilized enough from a surgical standpoint to move to the step down unit. On 04/06/2012 [facility] Team 1 assumed consultative medical care for this patient from the critical care medicine team.  Assessment/Plan:  Unrelenting nausea Quickly resolving with discontinuation of PCA narcotics  Sepsis due to pansensitive Escherichia coli *Source presumed to be from abscess *Cultures from abscess demonstrate pansensitive Escherichia coli *Remains hemodynamically stable *Augmentin as directed by surgery team  NSTEMI  post-op 04/04/12/ CAD, cath 2010, turned down for CABG "poor targets" *[facility] had been following *Continue Imdur *Was on Ranexa prior to admission but appears she was unable to afford this so plan is to arrange for samples of this medication (from cardiology) if she develops breakthrough chest pain *Low-dose aspirin 81 mg  *ECHO completed this admission - unfortunately images were inadequate to assess for wall motion abnormalities *Medical management only- not a surgical candidate  Grade 1 diastolic dysfunction *Currently appears to be well compensated  Gangrenous Perirectal abscess s/p I/D and debridement 9/2 *Per primary team (Gen Surgery)   History of chronic Iron defeicency anemia with acute post op anemia, transfused after debridment  04/04/12 *Hemoglobin remains stable  HTN, on multiple medications prior to admission. *Blood pressure now much better controlled *Given her financial situation unclear if patient was taking all of the medication she brought in with her at time of admission  Obesity, morbid/ Presumed OSA (obstructive sleep apnea) *Critical care medicine suspects the patient has underlying sleep apnea since she did not tolerate auto titration of CPAP during the hospitalization *Nocturnal trending pulse oximetry completed overnight 04/06/2012 to 04/07/2012 *I have spoken with Dr. Lake Bells pulmonologist who has arranged an outpatient polysomnogram -see followup in discharge manager *Patient has not tolerated the CPAP mask during this hospitalization and therefore has not been utilizing-we'll reorder nocturnal CPAP and see if a smaller mask will work better for this patient  Diabetes mellitus type 2, uncontrolled *Patient was on 75/25 insulin twice a day at home -here on a much lower dose of 70/30 insulin but doing well w/ 36 units twice daily *continue to titrate tx dose as needed - difficult given unreliable/inconsistent intake *Hemoglobin A1c elevated at 8.5  Clue cells on wet prep *Has been tx w/  IV clindamycin   Proliferative diabetic retinopathy  GERD *Continue PPI  Mild hypokalemia Replace  Limited access to outpatient healthcare *Patient reports she has Medicaid but has been unable to find an outpatient primary care physician who accepts Medicaid- appreciate Henrietta/case manager assistance in obtaining 2 physician names for the patient to followup to establish for primary care *She previously was an established patient at [facility]  DVT prophylaxis: Heparin subcutaneous Code Status: Full Disposition Plan: At discretion of primary team-from a medical standpoint she is appropriate to transfer out of the step down unit   Consultants: [facility]  Procedures: Incision, drainage and sharp debridement left buttock and perineal abscess with findings of  abscess and gaseous gangrene-04/03/2012 by Dr. Ninfa Linden  Cultures:  9/2 abscess culture>> multiple species  9/2 wet prep >> clue cells  9/2 blood culture >>  9/2 procalcitonin >>0.19  Antibiotics: 9/2 vanc >>9/2  9/2 Zosyn >> 9/4 9/2 clinda >>9/4 augmentin 9/6>>  HPI/Subjective: Anorexia and nausea beginning to improve today.  Denies cp, f/c, or signif sob.     Objective: Blood pressure 128/64, pulse 81, temperature 98.2 F (36.8 C), temperature source Oral, resp. rate 12, height 5\' 3"  (1.6 m), weight 125 kg (275 lb 9.2 oz), SpO2 99.00%.  Intake/Output Summary (Last 24 hours) at 04/10/12 1146 Last data filed at 04/10/12 0800  Gross per 24 hour  Intake    399 ml  Output    875 ml  Net   -476 ml     Exam: General: No acute respiratory distress at rest  Lungs: distant bs th/o - mild bibasilar crackles - no wheeze  Cardiovascular: Regular rate and rhythm without murmur gallop or rub normal S1 and S2 Abdomen: morbidly obese, soft, bs+, no rebound Extremities: 1+ B LE edema w/o cyanosis or clubbing  Data Reviewed: Basic Metabolic Panel:  Lab 0000000 0345 04/06/12 0435 04/05/12 0400 04/04/12 0415 04/03/12 1729 04/03/12 1355  NA 134* 134* 137 133* -- 134*  K 3.2* 3.7 3.6 3.2* -- 3.6  CL 98 101 104 102 -- --  CO2 28 24 23 22  -- --  GLUCOSE 92 224* 115* 122* -- --  BUN 8 9 10 8  -- --  CREATININE 0.96 0.95 1.12* 1.02 -- --  CALCIUM 9.1 8.4 8.1* 8.2* -- --  MG -- -- -- 1.9 2.0 --  PHOS -- -- -- -- 1.9* --   CBC:  Lab 04/10/12 0345 04/06/12 1140 04/05/12 0400 04/04/12 0415 04/03/12 1355  WBC 13.2* 13.7* 17.2* 21.5* --  NEUTROABS -- -- -- -- --  HGB 8.2* 8.3* 8.1* 7.5* 7.5*  HCT 24.9* 25.4* 24.1* 22.9* 22.0*  MCV 77.3* 77.7* 77.5* 77.4* --  PLT 261 337 322 321 --   Cardiac Enzymes:  Lab 04/04/12 2355 04/04/12 1754 04/04/12 1210 04/04/12 0415  04/03/12 1729  CKTOTAL -- -- -- -- --  CKMB -- -- -- -- --  CKMBINDEX -- -- -- -- --  TROPONINI 2.52* 2.62* 3.33* 2.00* 0.45*   CBG:  Lab 04/10/12 0810 04/09/12 2147 04/09/12 1716 04/09/12 1500 04/09/12 1208  GLUCAP 190* 76 129* 85 83    Recent Results (from the past 240 hour(s))  WET PREP, GENITAL     Status: Abnormal   Collection Time   04/03/12  9:51 AM      Component Value Range Status Comment   Yeast Wet Prep HPF POC NONE SEEN  NONE SEEN Final    Trich, Wet Prep NONE SEEN  NONE SEEN Final    Clue Cells Wet Prep HPF POC MODERATE (*) NONE SEEN Final    WBC, Wet Prep HPF POC FEW (*) NONE SEEN Final   CULTURE, BLOOD (ROUTINE X 2)     Status: Normal   Collection Time   04/03/12 10:33 AM      Component Value Range Status Comment   Specimen Description BLOOD LEFT ARM   Final    Special Requests BOTTLES DRAWN AEROBIC AND ANAEROBIC 10CC   Final    Culture  Setup Time 04/03/2012 19:05   Final    Culture NO GROWTH 5 DAYS   Final    Report Status 04/09/2012 FINAL   Final   CULTURE,  BLOOD (ROUTINE X 2)     Status: Normal   Collection Time   04/03/12 10:45 AM      Component Value Range Status Comment   Specimen Description BLOOD LEFT HAND   Final    Special Requests BOTTLES DRAWN AEROBIC AND ANAEROBIC 5CC   Final    Culture  Setup Time 04/03/2012 19:06   Final    Culture NO GROWTH 5 DAYS   Final    Report Status 04/09/2012 FINAL   Final   CULTURE, ROUTINE-ABSCESS     Status: Normal   Collection Time   04/03/12  2:01 PM      Component Value Range Status Comment   Specimen Description ABSCESS PERIRECTAL   Final    Special Requests PATIENT ON FOLLOWING VANCOMYCIN,ZOSYN,CLEOCIN   Final    Gram Stain     Final    Value: NO WBC SEEN     NO SQUAMOUS EPITHELIAL CELLS SEEN     FEW GRAM POSITIVE COCCI IN PAIRS     RARE GRAM POSITIVE RODS   Culture RARE ESCHERICHIA COLI   Final    Report Status 04/07/2012 FINAL   Final    Organism ID, Bacteria ESCHERICHIA COLI   Final   ANAEROBIC CULTURE      Status: Normal   Collection Time   04/03/12  2:01 PM      Component Value Range Status Comment   Specimen Description ABSCESS PERIRECTAL   Final    Special Requests PATIENT ON FOLLOWING VANCOMYCIN,ZOSYN,CLEOCIN   Final    Gram Stain     Final    Value: NO WBC SEEN     NO SQUAMOUS EPITHELIAL CELLS SEEN     FEW GRAM POSITIVE COCCI IN PAIRS     FEW GRAM POSITIVE RODS   Culture NO ANAEROBES ISOLATED   Final    Report Status 04/08/2012 FINAL   Final   MRSA PCR SCREENING     Status: Normal   Collection Time   04/03/12  6:28 PM      Component Value Range Status Comment   MRSA by PCR NEGATIVE  NEGATIVE Final      Studies:  Recent x-ray studies have been reviewed in detail by the Attending Physician  Scheduled Meds:  Reviewed in detail by the Attending Physician   Erin Hearing, ANP [facility] Office  [phone] Pager [phone]  On-Call/Text Page:      [email or web address]      password [facility]  04/10/2012, 11:46 AM   LOS: 7 days   I have personally examined this patient and reviewed the entire database. I have reviewed the above note, made any necessary editorial changes, and agree with its content.  Cherene Altes, MD [facility]

## 2012-04-10 NOTE — Progress Notes (Signed)
Report called to 6N. charge nurse. Ariz Terrones L

## 2012-04-10 NOTE — Progress Notes (Signed)
Pt to transfer to 6N room 20 via wheelchair, no O2, no IV. Meds in front of chart. VS stable upon discharge. No current questions or complaints. Family called and updated on room change by patient. Kang Ishida L

## 2012-04-10 NOTE — Progress Notes (Addendum)
Full note to follow.  Pt examined.  Data reviewed.  Pt is clear from a medical standpoint for transfer to a med/surg bed.  Cherene Altes, MD [facility] Office  [phone] Pager [phone]  On-Call/Text Page:      [email or web address]      password [facility]

## 2012-04-10 NOTE — Progress Notes (Signed)
Patient ID: [name], female   DOB: Jul 11, 1956, 56 y.o.   MRN: [id number] Patient ID: [name], female   DOB: October 16, 1955, 56 y.o.   MRN: [id number] Patient ID: [name], female   DOB: October 01, 1955, 56 y.o.   MRN: [id number] Patient ID: [name], female   DOB: 1955-12-13, 56 y.o.   MRN: [id number] Patient ID: [name], female   DOB: [birth year], 56 y.o.   MRN: [id number] Patient ID: [name], female   DOB: 06-22-1956, 56 y.o.   MRN: [id number] 7 Days Post-Op  Subjective: She appears comfortable,no further c/o of leg pain.  Objective: Vital signs in last 24 hours: Temp:  [97.9 F (36.6 C)-98.8 F (37.1 C)] 98.2 F (36.8 C) (09/09 0812) Pulse Rate:  [80-110] 81  (09/09 0812) Resp:  [11-21] 12  (09/09 0812) BP: (117-188)/(53-92) 128/64 mmHg (09/09 0812) SpO2:  [99 %-100 %] 99 % (09/09 0812) Last BM Date: 04/09/12  Intake/Output from previous day: 09/08 0701 - 09/09 0700 In: 625 [P.O.:457; I.V.:166; IV Piggyback:2] Out: [id number] [Urine:1875] Intake/Output this shift:    General appearance: alert, cooperative, appears stated age, no distress and morbidly obese Chest: CTA bilaterally Cardiac: RRR Left Buttock wound: dry exterior abd pad. Small amount of serous drainage on gauze, wound edges appear well perfused. continue with wet to dry for now. Start Sitz baths today. VSS,afebrile. Hypokalemia (being managed by medicine team)    Lab Results:   Basename 04/10/12 0345  WBC 13.2*  HGB 8.2*  HCT 24.9*  PLT 261   BMET  Basename 04/10/12 0345  NA 134*  K 3.2*  CL 98  CO2 28  GLUCOSE 92  BUN 8  CREATININE 0.96  CALCIUM 9.1   PT/INR No results found for this basename: LABPROT:2,INR:2 in the last 72 hours ABG No results found for this basename: PHART:2,PCO2:2,PO2:2,HCO3:2 in the last 72 hours  Studies/Results: No results found.  Anti-infectives: Anti-infectives     Start     Dose/Rate Route Frequency Ordered Stop   04/07/12 1545    amoxicillin-clavulanate (AUGMENTIN) 875-125 MG per tablet 1 tablet        1 tablet Oral Every 12 hours 04/07/12 1517     04/03/12 2200   piperacillin-tazobactam (ZOSYN) IVPB 3.375 g  Status:  Discontinued        3.375 g 12.5 mL/hr over 240 Minutes Intravenous 3 times per day 04/03/12 1712 04/05/12 2018   04/03/12 2000   clindamycin (CLEOCIN) IVPB 300 mg  Status:  Discontinued     Comments: Pharmacy may adjust dose      300 mg 100 mL/hr over 30 Minutes Intravenous Every 8 hours 04/03/12 1654 04/05/12 2018   04/03/12 1200   clindamycin (CLEOCIN) IVPB 600 mg  Status:  Discontinued        600 mg 100 mL/hr over 30 Minutes Intravenous  Once 04/03/12 1152 04/03/12 1716   04/03/12 1200   piperacillin-tazobactam (ZOSYN) IVPB 3.375 g  Status:  Discontinued        3.375 g 12.5 mL/hr over 240 Minutes Intravenous  Once 04/03/12 1152 04/05/12 2018   04/03/12 1100   vancomycin (VANCOCIN) 2,000 mg in sodium chloride 0.9 % 500 mL IVPB        2,000 mg 250 mL/hr over 120 Minutes Intravenous To Emergency Dept 04/03/12 0957 04/03/12 1334          Assessment/Plan:  Patient Active Problem List  Diagnosis  . DIABETES MELLITUS, TYPE II, ON INSULIN, UNCONTROLLED  .  History of chronic Iron defeicency anemia with acute post op anemia, transfused after debridment 04/04/12  . Proliferative diabetic retinopathy  . VISUAL CHANGES  . HTN, on multiple medications prior to admission.  . CAD, cath 2010, turned down for CABG "poor targets"  . GERD  . MENORRHAGIA, PERIMENOPAUSAL  . SHOULDER PAIN, LEFT  . DIZZINESS  . PERIPHERAL EDEMA  . DETACHED RETINA, BILATERAL, HX OF  . Chest pain at rest, secondary to viral syndrome.  negative MI  . Obesity, morbid  . Nausea and vomiting, secondary to viral syndrome  . Diabetes mellitus type 2, uncontrolled  . Sepsis due to Escherichia coli  . Gangrenous Perirectal abscess s/p I/D and debridement 9/2  . NSTEMI  post-op 04/04/12  . Presumed OSA (obstructive sleep apnea)   . Bacterial vaginosis  . Chronic diastolic heart failure, NYHA class 1    s/p Procedure(s) (LRB): IRRIGATION AND DEBRIDEMENT PERIRECTAL ABSCESS (Left) 1. Continue with daily wet to dry dressing changes bid. 2. Continue  ABX  3. Management of DM and other medical issues per [facility] 4. Mobilize; PT working with patient. 5. OOB 6.Will start Sitz baths today 7. Bedside commode   LOS: 7 days    [name] 04/10/2012

## 2012-04-10 NOTE — Progress Notes (Signed)
Tried to call report x 1 [name] L

## 2012-04-10 NOTE — Progress Notes (Signed)
Sitz bath performed as ordered. Pt tolerated procedure well. No frank blood noted.

## 2012-04-11 DIAGNOSIS — R42 Dizziness and giddiness: Secondary | ICD-10-CM

## 2012-04-11 LAB — BASIC METABOLIC PANEL
BUN: 9 mg/dL (ref 6–23)
GFR calc non Af Amer: 69 mL/min — ABNORMAL LOW (ref >90)
Glucose, Bld: 132 mg/dL — ABNORMAL HIGH (ref 70–99)
Potassium: 4.1 mEq/L (ref 3.5–5.1)

## 2012-04-11 LAB — GLUCOSE, CAPILLARY
Glucose-Capillary: 230 mg/dL — ABNORMAL HIGH (ref 70–99)
Glucose-Capillary: 270 mg/dL — ABNORMAL HIGH (ref 70–99)
Glucose-Capillary: 145 mg/dL — ABNORMAL HIGH (ref 70–99)

## 2012-04-11 NOTE — Progress Notes (Signed)
Pt has refused to wear cpap at this time

## 2012-04-11 NOTE — Progress Notes (Signed)
Patient with Left subclavian line that looks like dialysis line. [facility] stated that [facility] will addressed the said line. Dr. Karleen Hampshire notified about the line and she stated that she will let Dr. Thereasa Solo about it.

## 2012-04-11 NOTE — Progress Notes (Signed)
Clinical Social Work Department BRIEF PSYCHOSOCIAL ASSESSMENT 04/11/2012  Patient:  [name], [name]     Account Number:  [id number]     [place] date:  04/03/2012  Clinical Social Worker:  Earlie Server  Date/Time:  04/11/2012 12:00 N  Referred by:  Physician  Date Referred:  04/11/2012 Referred for  Psychosocial assessment  SNF Placement   Other Referral:   Interview type:  Patient Other interview type:    PSYCHOSOCIAL DATA Living Status:  FAMILY Admitted from facility:   Level of care:   Primary support name:  [name] Primary support relationship to patient:  SPOUSE Degree of support available:   Strong    CURRENT CONCERNS Current Concerns  Post-Acute Placement  Other - See comment   Other Concerns:   Electricity issues    SOCIAL WORK ASSESSMENT / PLAN CSW received referral to assist with emergency assistance and possible SNF placement. CSW reviewed chart and spoke with CM who reported LTAC referral had been made. CSW met with patient at bedside. No visitors were present.    CSW introduced myself and explained role. Patient reports she is currently living with husband but has no electricity. Patient reports that bill is over $2000. Patient reports that husband is "taking care of it all". CSW provided patient with emergency financial list. Patient reports that it is her husband's job to get electricity turned back on so she will provide husband with list but will not call any places on her own.    CSW and patient discussed dc plans. Due to patient's home environment, CSW offered SNF as alternative option in case LTAC is unable to accept patient. Patient agreeable to this plan. CSW provided patient with SNF list and explained process. Patient has never been to SNF in the past. CSW explained Medicare benefits. CSW received permission to complete a search in [place].    CSW submitted for pasarr, completed FL2 and completed search. CSW left FL2 and DMA form in chart for  MD signature. Once DMA form signed, CSW will submit for Medicaid approval. CSW will follow up with bed offers.   Assessment/plan status:  Psychosocial Support/Ongoing Assessment of Needs Other assessment/ plan:   Information/referral to community resources:   SNF list, emergency financial list    PATIENT'S/FAMILY'S RESPONSE TO PLAN OF CARE: Patient is alert and oriented. Patient engaged throughout assessment. Patient appreciative of CSW consult. Patient prefers LTAC but will go to SNF if LTAC not available.

## 2012-04-11 NOTE — Progress Notes (Signed)
Patient ID: [name], female   DOB: [birth year], 56 y.o.   MRN: [id number] Patient ID: [name], female   DOB: 1956-02-07, 56 y.o.   MRN: [id number] Patient ID: [name], female   DOB: [birth year], 56 y.o.   MRN: [id number] Patient ID: [name], female   DOB: 1956/05/14, 56 y.o.   MRN: [id number] Patient ID: [name], female   DOB: 06-24-1956, 56 y.o.   MRN: [id number] Patient ID: [name], female   DOB: June 11, 1956, 55 y.o.   MRN: [id number] Patient ID: [name], female   DOB: 1955-08-05, 56 y.o.   MRN: [id number] 8 Days Post-Op  Subjective: She appears comfortable,no new c/o offered.  Objective: Vital signs in last 24 hours: Temp:  [98.2 F (36.8 C)-98.8 F (37.1 C)] 98.3 F (36.8 C) (09/10 0622) Pulse Rate:  [77-92] 92  (09/10 0622) Resp:  [10-18] 18  (09/10 0622) BP: (138-171)/(61-75) 171/75 mmHg (09/10 0622) SpO2:  [97 %-100 %] 99 % (09/10 0622) Weight:  [268 lb (121.564 kg)] 268 lb (121.564 kg) (09/09 1857) Last BM Date: 04/10/12  Intake/Output from previous day: 09/09 0701 - 09/10 0700 In: 600 [P.O.:600] Out: 1350 [Urine:1350] Intake/Output this shift:    General appearance: alert, cooperative, appears stated age, no distress and morbidly obese Chest: CTA bilaterally Cardiac: RRR Left Buttock wound: dry exterior abd pad. Small amount of serous drainage on gauze, wound edges appear well perfused. continue with wet to dry for now.  VSS,afebrile. No labs today    Lab Results:   Basename 04/10/12 0345  WBC 13.2*  HGB 8.2*  HCT 24.9*  PLT 261   BMET  Basename 04/10/12 0345  NA 134*  K 3.2*  CL 98  CO2 28  GLUCOSE 92  BUN 8  CREATININE 0.96  CALCIUM 9.1   PT/INR No results found for this basename: LABPROT:2,INR:2 in the last 72 hours ABG No results found for this basename: PHART:2,PCO2:2,PO2:2,HCO3:2 in the last 72 hours  Studies/Results: No results found.  Anti-infectives: Anti-infectives     Start      Dose/Rate Route Frequency Ordered Stop   04/07/12 1545   amoxicillin-clavulanate (AUGMENTIN) 875-125 MG per tablet 1 tablet        1 tablet Oral Every 12 hours 04/07/12 1517     04/03/12 2200   piperacillin-tazobactam (ZOSYN) IVPB 3.375 g  Status:  Discontinued        3.375 g 12.5 mL/hr over 240 Minutes Intravenous 3 times per day 04/03/12 1712 04/05/12 2018   04/03/12 2000   clindamycin (CLEOCIN) IVPB 300 mg  Status:  Discontinued     Comments: Pharmacy may adjust dose      300 mg 100 mL/hr over 30 Minutes Intravenous Every 8 hours 04/03/12 1654 04/05/12 2018   04/03/12 1200   clindamycin (CLEOCIN) IVPB 600 mg  Status:  Discontinued        600 mg 100 mL/hr over 30 Minutes Intravenous  Once 04/03/12 1152 04/03/12 1716   04/03/12 1200   piperacillin-tazobactam (ZOSYN) IVPB 3.375 g  Status:  Discontinued        3.375 g 12.5 mL/hr over 240 Minutes Intravenous  Once 04/03/12 1152 04/05/12 2018   04/03/12 1100   vancomycin (VANCOCIN) 2,000 mg in sodium chloride 0.9 % 500 mL IVPB        2,000 mg 250 mL/hr over 120 Minutes Intravenous To Emergency Dept 04/03/12 0957 04/03/12 1334  Assessment/Plan:  Patient Active Problem List  Diagnosis  . DIABETES MELLITUS, TYPE II, ON INSULIN, UNCONTROLLED  . History of chronic Iron defeicency anemia with acute post op anemia, transfused after debridment 04/04/12  . Proliferative diabetic retinopathy  . VISUAL CHANGES  . HTN, on multiple medications prior to admission.  . CAD, cath 2010, turned down for CABG "poor targets"  . GERD  . MENORRHAGIA, PERIMENOPAUSAL  . SHOULDER PAIN, LEFT  . DIZZINESS  . PERIPHERAL EDEMA  . DETACHED RETINA, BILATERAL, HX OF  . Chest pain at rest, secondary to viral syndrome.  negative MI  . Obesity, morbid  . Nausea and vomiting, secondary to viral syndrome  . Diabetes mellitus type 2, uncontrolled  . Sepsis due to Escherichia coli  . Gangrenous Perirectal abscess s/p I/D and debridement 9/2  . NSTEMI   post-op 04/04/12  . Presumed OSA (obstructive sleep apnea)  . Bacterial vaginosis  . Chronic diastolic heart failure, NYHA class 1    s/p Procedure(s) (LRB): IRRIGATION AND DEBRIDEMENT PERIRECTAL ABSCESS (Left) 1. Continue with daily wet to dry dressing changes bid. 2. Continue  ABX  3. Management of DM and other medical issues per [facility] 4. Mobilize; PT working with patient. 5. OOB 6.Sitz baths 7. Bedside commode 8. Social work consult (patient has concerns about returning to home post discharge as electricity has been turned off secondary to financial issues and lack of family support at home for her wound care. She is the sole income provider for her family.)    LOS: 8 days    Jernie Schutt 04/11/2012

## 2012-04-11 NOTE — Consult Note (Signed)
[facility] Consultation Note [place] TEAM 1 - Stepdown/ICU TEAM   [name] [id number] DOB: 02/17/1956 DOA: 04/03/2012 PCP: Aldona Bar, MD  Brief narrative: 56 year old female patient admitted by the surgical service on 04/03/2012 for gangrenous left buttock/perineal abscess. She was emergently taken to the OR on the same date for incision and drainage as well as extensive debridement.  Pulmonary critical care medicine was asked to assist with managing patient's comorbid medical illnesses since the surgical team felt the patient's surgical problems warrented observation in the ICU immediately postop. Patient stabilized enough from a surgical standpoint to move to the step down unit. On 04/06/2012 [facility] Team 1 assumed consultative medical care for this patient from the critical care medicine team.  Assessment/Plan:  Unrelenting nausea Quickly resolving with discontinuation of PCA narcotics  Sepsis due to pansensitive Escherichia coli *Source presumed to be from abscess *Cultures from abscess demonstrate pansensitive Escherichia coli *Remains hemodynamically stable *Augmentin as directed by surgery team  NSTEMI  post-op 04/04/12/ CAD, cath 2010, turned down for CABG "poor targets" *[facility] had been following *Continue Imdur *Was on Ranexa prior to admission but appears she was unable to afford this so plan is to arrange for samples of this medication (from cardiology) if she develops breakthrough chest pain *Low-dose aspirin 81 mg  *ECHO completed this admission - unfortunately images were inadequate to assess for wall motion abnormalities *Medical management only- not a surgical candidate  Grade 1 diastolic dysfunction *Currently appears to be well compensated  Gangrenous Perirectal abscess s/p I/D and debridement 9/2 *Per primary team ([facility] Surgery)  Hypokalemia *Has been persistent despite regular potassium repletion *Repeat electrolyte panel  today   History of chronic Iron defeicency anemia with acute post op anemia, transfused after debridment 04/04/12 *Hemoglobin remains stable  HTN, on multiple medications prior to admission. *Blood pressure now much better controlled *Given her financial situation unclear if patient was taking all of the medication she brought in with her at time of admission  Chronic dizziness/question benign positional vertigo *Today patient clarified that her dizziness is a chronic nature. She endorses feeling more dizzy upon arising and this seems to get better for a period of time and then she will have another abrupt episode. *Will ask OT/PT to perform vestibular evaluation  Obesity, morbid/ Presumed OSA (obstructive sleep apnea) *Critical care medicine suspects the patient has underlying sleep apnea since she did not tolerate auto titration of CPAP during the hospitalization *Nocturnal trending pulse oximetry completed overnight 04/06/2012 to 04/07/2012 *I have spoken with Dr. Lake Bells pulmonologist who has arranged an outpatient polysomnogram -see followup in discharge manager *Patient has not tolerated the CPAP mask during this hospitalization and therefore has not been utilizing-we'll reorder nocturnal CPAP and see if a smaller mask will work better for this patient  Diabetes mellitus type 2, uncontrolled *Patient was on 75/25 insulin twice a day at home -here on a much lower dose of 70/30 insulin but doing well w/ 36 units twice daily *continue to titrate tx dose as needed - difficult given unreliable/inconsistent intake *Hemoglobin A1c elevated at 8.5  Clue cells on wet prep *Has been tx w/  IV clindamycin   Proliferative diabetic retinopathy  GERD *Continue PPI  Mild hypokalemia Replace  Limited access to outpatient healthcare *Patient reports she has Medicaid but has been unable to find an outpatient primary care physician who accepts Medicaid- appreciate Henrietta/case manager  assistance in obtaining 2 physician names for the patient to followup to establish for primary care *  She previously was an established patient at [facility]  DVT prophylaxis: Heparin subcutaneous Code Status: Full Disposition Plan: Has been transferred to surgical floor. Patient having financial issues as well as some mobility issues and surgery has ask social worker to initiate skilled nursing facility bed search.  Consultants: [facility]  Procedures: Incision, drainage and sharp debridement left buttock and perineal abscess with findings of abscess and gaseous gangrene-04/03/2012 by Dr. Ninfa Linden  Cultures:  9/2 abscess culture>> multiple species  9/2 wet prep >> clue cells  9/2 blood culture >>  9/2 procalcitonin >>0.19  Antibiotics: 9/2 vanc >>9/2  9/2 Zosyn >> 9/4 9/2 clinda >>9/4 augmentin 9/6>>  HPI/Subjective: Anorexia and nausea beginning to improve today.  Denies cp, f/c, or signif sob.  Continues to have problems with dizziness which, after retaking a history, appears may be vertigo.    Objective: Blood pressure 171/75, pulse 92, temperature 98.3 F (36.8 C), temperature source Oral, resp. rate 18, height 5\' 3"  (1.6 m), weight 121.564 kg (268 lb), SpO2 99.00%.  Intake/Output Summary (Last 24 hours) at 04/11/12 1244 Last data filed at 04/11/12 0900  Gross per 24 hour  Intake    720 ml  Output   1350 ml  Net   -630 ml     Exam: General: No acute respiratory distress at rest  Lungs: distant bs th/o - mild bibasilar crackles - no wheeze  Cardiovascular: Regular rate and rhythm without murmur gallop or rub normal S1 and S2 Abdomen: morbidly obese, soft, bs+, no rebound Extremities: 1+ B LE edema w/o cyanosis or clubbing Neurological: Currently while seated in the bed no complaints of dizziness, no focal neurological abnormalities appreciated  Data Reviewed: Basic Metabolic Panel:  Lab 0000000 0345 04/06/12  0435 04/05/12 0400  NA 134* 134* 137  K 3.2* 3.7 3.6  CL 98 101 104  CO2 28 24 23   GLUCOSE 92 224* 115*  BUN 8 9 10   CREATININE 0.96 0.95 1.12*  CALCIUM 9.1 8.4 8.1*  MG -- -- --  PHOS -- -- --   CBC:  Lab 04/10/12 0345 04/06/12 1140 04/05/12 0400  WBC 13.2* 13.7* 17.2*  NEUTROABS -- -- --  HGB 8.2* 8.3* 8.1*  HCT 24.9* 25.4* 24.1*  MCV 77.3* 77.7* 77.5*  PLT 261 337 322   Cardiac Enzymes:  Lab 04/04/12 2355 04/04/12 1754  CKTOTAL -- --  CKMB -- --  CKMBINDEX -- --  TROPONINI 2.52* 2.62*   CBG:  Lab 04/11/12 1221 04/11/12 0817 04/11/12 0447 04/10/12 2129 04/10/12 1623  GLUCAP 149* 230* 159* 74 147*    Recent Results (from the past 240 hour(s))  WET PREP, GENITAL     Status: Abnormal   Collection Time   04/03/12  9:51 AM      Component Value Range Status Comment   Yeast Wet Prep HPF POC NONE SEEN  NONE SEEN Final    Trich, Wet Prep NONE SEEN  NONE SEEN Final    Clue Cells Wet Prep HPF POC MODERATE (*) NONE SEEN Final    WBC, Wet Prep HPF POC FEW (*) NONE SEEN Final   CULTURE, BLOOD (ROUTINE X 2)     Status: Normal   Collection Time   04/03/12 10:33 AM      Component Value Range Status Comment   Specimen Description BLOOD LEFT ARM   Final    Special Requests BOTTLES DRAWN AEROBIC AND ANAEROBIC 10CC   Final    Culture  Setup Time  04/03/2012 19:05   Final    Culture NO GROWTH 5 DAYS   Final    Report Status 04/09/2012 FINAL   Final   CULTURE, BLOOD (ROUTINE X 2)     Status: Normal   Collection Time   04/03/12 10:45 AM      Component Value Range Status Comment   Specimen Description BLOOD LEFT HAND   Final    Special Requests BOTTLES DRAWN AEROBIC AND ANAEROBIC 5CC   Final    Culture  Setup Time 04/03/2012 19:06   Final    Culture NO GROWTH 5 DAYS   Final    Report Status 04/09/2012 FINAL   Final   CULTURE, ROUTINE-ABSCESS     Status: Normal   Collection Time   04/03/12  2:01 PM      Component Value Range Status Comment   Specimen Description ABSCESS PERIRECTAL    Final    Special Requests PATIENT ON FOLLOWING VANCOMYCIN,ZOSYN,CLEOCIN   Final    Gram Stain     Final    Value: NO WBC SEEN     NO SQUAMOUS EPITHELIAL CELLS SEEN     FEW GRAM POSITIVE COCCI IN PAIRS     RARE GRAM POSITIVE RODS   Culture RARE ESCHERICHIA COLI   Final    Report Status 04/07/2012 FINAL   Final    Organism ID, Bacteria ESCHERICHIA COLI   Final   ANAEROBIC CULTURE     Status: Normal   Collection Time   04/03/12  2:01 PM      Component Value Range Status Comment   Specimen Description ABSCESS PERIRECTAL   Final    Special Requests PATIENT ON FOLLOWING VANCOMYCIN,ZOSYN,CLEOCIN   Final    Gram Stain     Final    Value: NO WBC SEEN     NO SQUAMOUS EPITHELIAL CELLS SEEN     FEW GRAM POSITIVE COCCI IN PAIRS     FEW GRAM POSITIVE RODS   Culture NO ANAEROBES ISOLATED   Final    Report Status 04/08/2012 FINAL   Final   MRSA PCR SCREENING     Status: Normal   Collection Time   04/03/12  6:28 PM      Component Value Range Status Comment   MRSA by PCR NEGATIVE  NEGATIVE Final      Studies:  Recent x-ray studies have been reviewed in detail by the Attending Physician  Scheduled Meds:  Reviewed in detail by the Attending Physician   Erin Hearing, ANP [facility] Office  [phone] Pager [phone]  On-Call/Text Page:      [email or web address]      password [facility]  04/11/2012, 12:44 PM   LOS: 8 days   I have examined the patient, reviewed the chart and modified the above note which I agree with.   Debbe Odea, MD [phone]

## 2012-04-11 NOTE — Progress Notes (Addendum)
Clinical Social Work Department CLINICAL SOCIAL WORK PLACEMENT NOTE 04/11/2012  Patient:  [name], [name]  Account Number:  [id number] [place] date:  04/03/2012  Clinical Social Worker:  Sindy Messing, LCSW  Date/time:  04/11/2012 12:00 N  Clinical Social Work is seeking post-discharge placement for this patient at the following level of care:   SKILLED NURSING   (*CSW will update this form in Epic as items are completed)   04/11/2012  Patient/family provided with [place] Department of Clinical Social Work's list of facilities offering this level of care within the geographic area requested by the patient (or if unable, by the patient's family).  04/11/2012  Patient/family informed of their freedom to choose among providers that offer the needed level of care, that participate in Medicare, Medicaid or managed care program needed by the patient, have an available bed and are willing to accept the patient.  04/11/2012  Patient/family informed of [facility]' ownership interest in [facility], as well as of the fact that they are under no obligation to receive care at this facility.  PASARR submitted to [facility] on 04/11/2012 PASARR number received from [employer] on 04/11/2012  FL2 transmitted to all facilities in geographic area requested by pt/family on  04/11/2012 FL2 transmitted to all facilities within larger geographic area on   Patient informed that his/her managed care company has contracts with or will negotiate with  certain facilities, including the following:     Patient/family informed of bed offers received:  04/12/12 Patient chooses bed at [facility] Physician recommends and patient chooses bed at    Patient to be transferred to [facility] on  04/13/12 Patient to be transferred to facility by [facility]  The following physician request were entered in Epic:   Additional Comments:

## 2012-04-11 NOTE — Progress Notes (Signed)
Wound is covered.  Will try to visualize tomorrow before she leaves.  [name]. Elizabeth Bailiff, MD, [place] [phone] [facility]

## 2012-04-12 DIAGNOSIS — B9689 Other specified bacterial agents as the cause of diseases classified elsewhere: Secondary | ICD-10-CM

## 2012-04-12 DIAGNOSIS — A499 Bacterial infection, unspecified: Secondary | ICD-10-CM

## 2012-04-12 DIAGNOSIS — R42 Dizziness and giddiness: Secondary | ICD-10-CM

## 2012-04-12 LAB — GLUCOSE, CAPILLARY: Glucose-Capillary: 146 mg/dL — ABNORMAL HIGH (ref 70–99)

## 2012-04-12 MED ORDER — MECLIZINE HCL 12.5 MG PO TABS
12.5000 mg | ORAL_TABLET | Freq: Three times a day (TID) | ORAL | Status: DC | PRN
  Filled 2012-04-12: qty 1

## 2012-04-12 MED ORDER — HYDRALAZINE HCL 50 MG PO TABS
50.0000 mg | ORAL_TABLET | Freq: Three times a day (TID) | ORAL | Status: DC
  Administered 2012-04-12 – 2012-04-13 (×2): 50 mg via ORAL
  Filled 2012-04-12 (×6): qty 1

## 2012-04-12 MED ORDER — METOPROLOL SUCCINATE ER 100 MG PO TB24
200.0000 mg | ORAL_TABLET | Freq: Every day | ORAL | Status: DC
  Administered 2012-04-13: 200 mg via ORAL
  Filled 2012-04-12: qty 2

## 2012-04-12 MED ORDER — INSULIN ASPART PROT & ASPART (70-30 MIX) 100 UNIT/ML ~~LOC~~ SUSP
38.0000 [IU] | Freq: Two times a day (BID) | SUBCUTANEOUS | Status: DC
  Administered 2012-04-13: 38 [IU] via SUBCUTANEOUS

## 2012-04-12 MED ORDER — METOPROLOL SUCCINATE ER 50 MG PO TB24: 250.0000 mg | ORAL_TABLET | Freq: Every day | ORAL | Status: DC

## 2012-04-12 NOTE — Progress Notes (Signed)
PT refuded her CPAP. Told her to have RN call me if she changes her mind

## 2012-04-12 NOTE — Progress Notes (Signed)
Patient ID: [name], female   DOB: 08/12/1955, 56 y.o.   MRN: [id number]  9 Days Post-Op  Subjective: She appears comfortable,no new c/o offered.Tolerated wound care well. Objective: Vital signs in last 24 hours: Temp:  [97.9 F (36.6 C)-98.8 F (37.1 C)] 98.8 F (37.1 C) (09/11 0825) Pulse Rate:  [78-85] 83  (09/11 1041) Resp:  [16-18] 16  (09/11 0825) BP: (144-167)/(62-70) 167/68 mmHg (09/11 1041) SpO2:  [93 %-99 %] 97 % (09/11 0825) Last BM Date: 04/11/12  Intake/Output from previous day: 09/10 0701 - 09/11 0700 In: 480 [P.O.:480] Out: 1200 [Urine:1200] Intake/Output this shift:    General appearance: alert, cooperative, appears stated age, no distress and morbidly obese Chest: CTA bilaterally Cardiac: RRR Left Buttock wound: dry exterior abd pad. Small amount of serous drainage on gauze, wound edges appear well perfused. continue with wet to dry, okay to shower. VSS,afebrile. No labs today    Lab Results:   Basename 04/10/12 0345  WBC 13.2*  HGB 8.2*  HCT 24.9*  PLT 261   BMET  Basename 04/11/12 1327 04/10/12 0345  NA 134* 134*  K 4.1 3.2*  CL 100 98  CO2 25 28  GLUCOSE 132* 92  BUN 9 8  CREATININE 0.91 0.96  CALCIUM 9.6 9.1   PT/INR No results found for this basename: LABPROT:2,INR:2 in the last 72 hours ABG No results found for this basename: PHART:2,PCO2:2,PO2:2,HCO3:2 in the last 72 hours  Studies/Results: No results found.  Anti-infectives: Anti-infectives     Start     Dose/Rate Route Frequency Ordered Stop   04/07/12 1545   amoxicillin-clavulanate (AUGMENTIN) 875-125 MG per tablet 1 tablet        1 tablet Oral Every 12 hours 04/07/12 1517     04/03/12 2200   piperacillin-tazobactam (ZOSYN) IVPB 3.375 g  Status:  Discontinued        3.375 g 12.5 mL/hr over 240 Minutes Intravenous 3 times per day 04/03/12 1712 04/05/12 2018   04/03/12 2000   clindamycin (CLEOCIN) IVPB 300 mg  Status:  Discontinued     Comments: Pharmacy may  adjust dose      300 mg 100 mL/hr over 30 Minutes Intravenous Every 8 hours 04/03/12 1654 04/05/12 2018   04/03/12 1200   clindamycin (CLEOCIN) IVPB 600 mg  Status:  Discontinued        600 mg 100 mL/hr over 30 Minutes Intravenous  Once 04/03/12 1152 04/03/12 1716   04/03/12 1200   piperacillin-tazobactam (ZOSYN) IVPB 3.375 g  Status:  Discontinued        3.375 g 12.5 mL/hr over 240 Minutes Intravenous  Once 04/03/12 1152 04/05/12 2018   04/03/12 1100   vancomycin (VANCOCIN) 2,000 mg in sodium chloride 0.9 % 500 mL IVPB        2,000 mg 250 mL/hr over 120 Minutes Intravenous To Emergency Dept 04/03/12 0957 04/03/12 1334          Assessment/Plan:  Patient Active Problem List  Diagnosis  . DIABETES MELLITUS, TYPE II, ON INSULIN, UNCONTROLLED  . History of chronic Iron defeicency anemia with acute post op anemia, transfused after debridment 04/04/12  . Proliferative diabetic retinopathy  . VISUAL CHANGES  . HTN, on multiple medications prior to admission.  . CAD, cath 2010, turned down for CABG "poor targets"  . GERD  . MENORRHAGIA, PERIMENOPAUSAL  . SHOULDER PAIN, LEFT  . DIZZINESS  . PERIPHERAL EDEMA  . DETACHED RETINA, BILATERAL, HX OF  . Chest pain at rest,  secondary to viral syndrome.  negative MI  . Obesity, morbid  . Nausea and vomiting, secondary to viral syndrome  . Diabetes mellitus type 2, uncontrolled  . Sepsis due to Escherichia coli  . Gangrenous Perirectal abscess s/p I/D and debridement 9/2  . NSTEMI  post-op 04/04/12  . Presumed OSA (obstructive sleep apnea)  . Bacterial vaginosis  . Chronic diastolic heart failure, NYHA class 1  . Hypokalemia    s/p Procedure(s) (LRB): IRRIGATION AND DEBRIDEMENT PERIRECTAL ABSCESS (Left) 1. Continue with daily wet to dry dressing changes bid.  Patient may shower. Utilize water pulse to wound. 2. Continue ABX probable dc in am.  3. Management of DM and other medical issues per [facility] 4. Mobilize; PT working  with patient. 5. OOB 6 Ambulate  to bathroom 7. Probable discharge tomorrow to SNF.   LOS: 9 days    [name] 04/12/2012

## 2012-04-12 NOTE — Progress Notes (Signed)
Occupational Therapy Evaluation Patient Details Name: [name] MRN: [id number] DOB: 06/05/1956 Today's Date: 04/12/2012 Time: [id number] OT Time Calculation (min): 59 min  OT Assessment / Plan / Recommendation Clinical Impression  Pt admitted with buttock abscess with I&D and sepsis.  Pt also has now developed bil BPPV.  Will benefit from acute OT services to address below problem list. Recommending SNF since pt has no assistance at home.    OT Assessment  Patient needs continued OT Services    Follow Up Recommendations  Skilled nursing facility    Barriers to Discharge Decreased caregiver support;Inaccessible home environment Pt's husband is often out of town. Pt's house has no power.  Equipment Recommendations  3 in 1 bedside comode (bariatric)    Recommendations for Other Services    Frequency  Min 2X/week    Precautions / Restrictions Precautions Precautions: Fall Restrictions Weight Bearing Restrictions: No   Pertinent Vitals/Pain See vitals    ADL  Eating/Feeding: Performed;Independent Where Assessed - Eating/Feeding: Chair Upper Body Dressing: Performed;Minimal assistance Where Assessed - Upper Body Dressing: Unsupported sitting Toilet Transfer: Simulated;+2 Total assistance Toilet Transfer: Patient Percentage: 60% Toilet Transfer Method: Sit to stand Equipment Used: Rolling walker Transfers/Ambulation Related to ADLs: +2 for safety due to dizziness ADL Comments: Limited by dizziness    OT Diagnosis: Generalized weakness;Acute pain  OT Problem List: Decreased strength;Decreased activity tolerance;Impaired balance (sitting and/or standing);Decreased knowledge of use of DME or AE;Pain;Obesity OT Treatment Interventions: Self-care/ADL training;DME and/or AE instruction;Therapeutic activities;Patient/family education;Balance training   OT Goals Acute Rehab OT Goals OT Goal Formulation: With patient Time For Goal Achievement: 04/19/12 Potential to Achieve  Goals: Good ADL Goals Pt Will Perform Grooming: with modified independence;Standing at sink ADL Goal: Grooming - Progress: Goal set today Pt Will Perform Upper Body Dressing: with modified independence;Sitting, chair;Sitting, bed ADL Goal: Upper Body Dressing - Progress: Goal set today Pt Will Perform Lower Body Dressing: with modified independence;Sit to stand from chair;Sit to stand from bed ADL Goal: Lower Body Dressing - Progress: Goal set today Pt Will Transfer to Toilet: with modified independence;Ambulation;with DME;Comfort height toilet ADL Goal: Toilet Transfer - Progress: Goal set today Pt Will Perform Toileting - Clothing Manipulation: with modified independence;Standing;Sitting on 3-in-1 or toilet ADL Goal: Toileting - Clothing Manipulation - Progress: Goal set today Pt Will Perform Toileting - Hygiene: with modified independence;Standing at 3-in-1/toilet;Sit to stand from 3-in-1/toilet ADL Goal: Toileting - Hygiene - Progress: Goal set today  Visit Information  Last OT Received On: 04/12/12 Assistance Needed: +2    Subjective Data      Prior Functioning  Vision/Perception  Home Living Lives With: Spouse (works out of town) Type of Home: House Home Access: Level entry Home Layout: Multi-level;Able to live on main level with bedroom/bathroom (3 stories with bed/bath and kitchen on 2nd) Alternate Level Stairs-Number of Steps: flight Alternate Level Stairs-Rails: Right Bathroom Shower/Tub: Walk-in shower;Door [employer] Toilet: Standard Home Adaptive Equipment: Straight cane;Walker - rolling (right eye patch) Prior Function Level of Independence: Independent with assistive device(s) Able to Take Stairs?: Yes Driving: No Vocation: On disability Communication Communication: No difficulties Dominant Hand: Right   Vision - Assessment Additional Comments: 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  Pt usually wears eye patch.  Reports she cannot differentiate between light  and dark.  Cognition  Overall Cognitive Status: Appears within functional limits for tasks assessed/performed Arousal/Alertness: Awake/alert Orientation Level: Appears intact for tasks assessed Behavior During Session: [facility] for tasks performed    Extremity/Trunk Assessment Right  Upper Extremity Assessment RUE ROM/Strength/Tone: [facility] for tasks assessed Left Upper Extremity Assessment LUE ROM/Strength/Tone: WFL for tasks assessed Right Lower Extremity Assessment RLE ROM/Strength/Tone: Deficits RLE ROM/Strength/Tone Deficits: grossly 3+/5 Left Lower Extremity Assessment LLE ROM/Strength/Tone: Deficits LLE ROM/Strength/Tone Deficits: grossly 3+/5   Mobility  Shoulder Instructions  Bed Mobility Bed Mobility: Rolling Left;Rolling Right;Right Sidelying to Sit;Left Sidelying to Sit;Sitting - Scoot to Edge of Bed Rolling Right: 5: Supervision;With rail Rolling Left: 5: Supervision;With rail Right Sidelying to Sit: 4: Min guard Left Sidelying to Sit: 4: Min guard Supine to Sit: Not tested (comment) Sitting - Scoot to Edge of Bed: 5: Supervision Details for Bed Mobility Assistance: PAtient maneuvers in bed fairly well.  Did a fair amount of rolling and bed mobility during vestibular assessment.  Patient with + hallpike dix bilaterally.  Felt that LBPPV was worse than RBPPV.  Treated with canalith repositioning for bil BPPV.  Unsure of success as patient was dizzy after treatment.  Will check back in am and determine effectiveness and continue treatment as necessary.   Transfers Sit to Stand: 1: +2 Total assist;From elevated surface;With upper extremity assist;From bed Sit to Stand: Patient Percentage: 60% Stand to Sit: 1: +2 Total assist;With upper extremity assist;With armrests;To chair/3-in-1 Stand to Sit: Patient Percentage: 70% Details for Transfer Assistance: Patient's knees buckled when attempting to side step to left with patient holding onto PT/OT's hands for support.  Transfer to chair  was fairly smooth using RW with cues for sequencing steps and RW.         Exercise     Balance Static Sitting Balance Static Sitting - Balance Support: No upper extremity supported;Feet supported Static Sitting - Level of Assistance: 6: Modified independent (Device/Increase time) Static Sitting - Comment/# of Minutes: 2 minutes   End of Session OT - End of Session Activity Tolerance: Patient limited by fatigue Patient left: in chair;with call bell/phone within reach Nurse Communication: Mobility status  GO    04/12/2012 Darrol Jump OTR/L Pager [phone] Office [phone]  Darrol Jump 04/12/2012, 5:08 PM

## 2012-04-12 NOTE — Progress Notes (Signed)
Clinical Social Work  CSW met with patient at bedside to discuss bed offers. The following extended bed offers: [facility], [facility], [facility], [facility], [facility], [facility] and [facility]. Patient reports she will speak with husband and make a decision regarding SNF. CSW agreed to follow up and provided patient with CSW contact information.  [place], [place] [phone]

## 2012-04-12 NOTE — Evaluation (Signed)
Physical Therapy Evaluation Patient Details Name: [name] MRN: [id number] DOB: 03-09-1956 Today's Date: 04/12/2012 Time: [id number] PT Time Calculation (min): 55 min  PT Assessment / Plan / Recommendation Clinical Impression  PAtient admitted with buttock abscess with I&D as well as sepsis.  Has also developed bil BPPV.  Will need continued PT to address vestibular issues as welll as mobility issues.  Recommend NHP for more therapy prior to d/c home.  Patient will not have 24 hour care.      PT Assessment  Patient needs continued PT services    Follow Up Recommendations  Skilled nursing facility;Supervision/Assistance - 24 hour    Barriers to Discharge Decreased caregiver support      Equipment Recommendations  3 in 1 bedside comode (bariatric )    Recommendations for Other Services     Frequency Min 3X/week    Precautions / Restrictions Precautions Precautions: Fall Restrictions Weight Bearing Restrictions: No   Pertinent Vitals/Pain VSS, No pain      Mobility  Bed Mobility Bed Mobility: Rolling Left;Rolling Right;Right Sidelying to Sit;Left Sidelying to Sit;Sitting - Scoot to Edge of Bed Rolling Right: 5: Supervision;With rail Rolling Left: 5: Supervision;With rail Right Sidelying to Sit: 4: Min guard Left Sidelying to Sit: 4: Min guard Supine to Sit: Not tested (comment) Sitting - Scoot to Edge of Bed: 5: Supervision Details for Bed Mobility Assistance: PAtient maneuvers in bed fairly well.  Did a fair amount of rolling and bed mobility during vestibular assessment.  Patient with + hallpike dix bilaterally.  Felt that LBPPV was worse than RBPPV.  Treated with canalith repositioning for bil BPPV.  Unsure of success as patient was dizzy after treatment.  Will check back in am and determine effectiveness and continue treatment as necessary.   Transfers Transfers: Sit to Stand;Stand to Sit;Stand Pivot Transfers Sit to Stand: 1: +2 Total assist;From elevated  surface;With upper extremity assist;From bed Sit to Stand: Patient Percentage: 60% Stand to Sit: 1: +2 Total assist;With upper extremity assist;With armrests;To chair/3-in-1 Stand to Sit: Patient Percentage: 70% Stand Pivot Transfers: 1: +2 Total assist Stand Pivot Transfers: Patient Percentage: 70% Details for Transfer Assistance: Patient's knees buckled when attempting to side step to left with patient holding onto PT/OT's hands for support.  Transfer to chair was fairly smooth using RW with cues for sequencing steps and RW.   Ambulation/Gait Ambulation/Gait Assistance: Not tested (comment) Stairs: No Wheelchair Mobility Wheelchair Mobility: No         PT Diagnosis: Generalized weakness (dizziness)  PT Problem List: Decreased activity tolerance;Decreased balance;Decreased safety awareness;Decreased mobility;Decreased knowledge of use of DME;Obesity (dizziness, giddiness) PT Treatment Interventions: DME instruction;Gait training;Functional mobility training;Therapeutic activities;Therapeutic exercise;Balance training;Patient/family education (canalith repositioning maneuver)   PT Goals Acute Rehab PT Goals PT Goal Formulation: With patient Time For Goal Achievement: 04/26/12 Potential to Achieve Goals: Good Pt will go Supine/Side to Sit: with supervision PT Goal: Supine/Side to Sit - Progress: Goal set today Pt will go Sit to Supine/Side: with supervision PT Goal: Sit to Supine/Side - Progress: Goal set today Pt will go Sit to Stand: with supervision;with upper extremity assist PT Goal: Sit to Stand - Progress: Goal set today Pt will go Stand to Sit: with supervision;with upper extremity assist PT Goal: Stand to Sit - Progress: Goal set today Pt will Ambulate: 16 - 50 feet;with min assist;with least restrictive assistive device PT Goal: Ambulate - Progress: Goal set today Additional Goals Additional Goal #1: Patient will have - hallpike tests for bil  BPPV. PT Goal: Additional  Goal #1 - Progress: Goal set today  Visit Information  Last PT Received On: 04/12/12 Assistance Needed: +2 PT/OT Co-Evaluation/Treatment: Yes    Subjective Data  Subjective: PAtient states her husband is working out of town right now. Patient Stated Goal: to go home   Prior [place] Lives With: Spouse (works out of town) Type of Home: House Home Access: Level entry Home Layout: Multi-level;Able to live on main level with bedroom/bathroom (3 stories with bed/bath and kitchen on 2nd) Alternate Level Stairs-Number of Steps: flight Alternate Level Stairs-Rails: Right Bathroom Shower/Tub: Walk-in shower;Door [employer] Toilet: Standard Home Adaptive Equipment: Straight cane;Walker - rolling (right eye patch) Prior Function Level of Independence: Independent with assistive device(s) Able to Take Stairs?: Yes Driving: No Vocation: On disability Communication Communication: No difficulties Dominant Hand: Right    Cognition  Overall Cognitive Status: Appears within functional limits for tasks assessed/performed Arousal/Alertness: Awake/alert Orientation Level: Appears intact for tasks assessed Behavior During Session: [facility] for tasks performed    Extremity/Trunk Assessment Right Upper Extremity Assessment RUE ROM/Strength/Tone: [facility] for tasks assessed Left Upper Extremity Assessment LUE ROM/Strength/Tone: WFL for tasks assessed Right Lower Extremity Assessment RLE ROM/Strength/Tone: Deficits RLE ROM/Strength/Tone Deficits: grossly 3+/5 Left Lower Extremity Assessment LLE ROM/Strength/Tone: Deficits LLE ROM/Strength/Tone Deficits: grossly 3+/5   Balance Static Sitting Balance Static Sitting - Balance Support: No upper extremity supported;Feet supported Static Sitting - Level of Assistance: 6: Modified independent (Device/Increase time) Static Sitting - Comment/# of Minutes: 2 minutes  End of Session PT - End of Session Equipment Utilized During Treatment: Gait  belt Activity Tolerance: Patient tolerated treatment well Patient left: in chair;with call bell/phone within reach Nurse Communication: Mobility status       INGOLD,Seymone Forlenza 04/12/2012, 1:53 PM  [facility] Acute Rehabilitation [phone] (pager)

## 2012-04-12 NOTE — Consult Note (Addendum)
[facility] Consultation Note [place] TEAM 1 - Stepdown/ICU TEAM   [name] [id number] DOB: February 22, 1956 DOA: 04/03/2012 PCP: Aldona Bar, MD  Brief narrative: 56 year old female patient admitted by the surgical service on 04/03/2012 for gangrenous left buttock/perineal abscess. She was emergently taken to the OR on the same date for incision and drainage as well as extensive debridement.  Pulmonary critical care medicine was asked to assist with managing patient's comorbid medical illnesses since the surgical team felt the patient's surgical problems warrented observation in the ICU immediately postop. Patient stabilized enough from a surgical standpoint to move to the step down unit. On 04/06/2012 [facility] Team 1 assumed consultative medical care for this patient from the critical care medicine team.  Assessment/Plan:  Unrelenting nausea Quickly resolving with discontinuation of PCA narcotics  Sepsis due to pansensitive Escherichia coli *Source presumed to be from abscess *Cultures from abscess demonstrate pansensitive Escherichia coli *Remains hemodynamically stable *Augmentin as directed by surgery team  NSTEMI  post-op 04/04/12/ CAD, cath 2010, turned down for CABG "poor targets" *[facility] had been following *Continue Imdur *Was on Ranexa prior to admission but appears she was unable to afford this so plan is to arrange for samples of this medication (from cardiology) if she develops breakthrough chest pain *Low-dose aspirin 81 mg  *ECHO completed this admission - unfortunately images were inadequate to assess for wall motion abnormalities *Medical management only- not a surgical candidate  Grade 1 diastolic dysfunction *Currently appears to be well compensated  Gangrenous Perirectal abscess s/p I/D and debridement 9/2 *Per primary team (Gen Surgery)  Hypokalemia *Had been persistent despite regular potassium repletion *now resolved   History of  chronic Iron defeicency anemia with acute post op anemia, transfused after debridment 04/04/12 *Hemoglobin remains stable  HTN, on multiple medications prior to admission. *Blood pressure now better controlled, but not ideal - meds being adjusted - ok to d/c when cleared by Gen Surg w/ ongoing outpt titration of BP meds *Given her financial situation unclear if patient was taking all of the medication she brought in with her at time of admission  Chronic dizziness/question benign positional vertigo *Patient clarified that her dizziness is of a chronic nature. She endorses feeling more dizzy upon rising and this seems to get better for a period of time and then she will have another abrupt episode. *OT/PT has performed a vestibular evaluation- awaiting results * Begin prn Meclizine  Obesity, morbid/ Presumed OSA (obstructive sleep apnea) *Critical care medicine suspects the patient has underlying sleep apnea since she did not tolerate auto titration of CPAP during the hospitalization *Nocturnal trending pulse oximetry completed overnight 04/06/2012 to 04/07/2012 *I have spoken with Dr. [place] Bells pulmonologist who has arranged an outpatient polysomnogram -see followup in discharge manager *Patient has not tolerated the CPAP mask during this hospitalization and therefore has not been utilizing-we'll reorder nocturnal CPAP and see if a smaller mask will work better for this patient  Diabetes mellitus type 2, uncontrolled *Patient was on 75/25 insulin twice a day at home -here on a much lower dose of 70/30 insulin but doing well w/ 36 units twice daily *continue to titrate tx dose as needed - difficult given unreliable/inconsistent intake *Hemoglobin A1c elevated at 8.5 *medicaly cleared for d/c to SNF w/ ongoing outpt titration of DM meds  Clue cells on wet prep *Has been tx w/  IV clindamycin   Proliferative diabetic retinopathy  GERD *Continue PPI  Limited access to outpatient  healthcare *Patient reports she has  Medicaid but has been unable to find an outpatient primary care physician who accepts Medicaid- appreciate Henrietta/case manager assistance in obtaining 2 physician names for the patient to followup to establish for primary care *[facility]  DVT prophylaxis: Heparin subcutaneous Code Status: Full Disposition Plan: Has been transferred to surgical floor. Patient having financial issues as well as some mobility issues and surgery has ask social worker to initiate skilled nursing facility bed search.  Consultants: [facility]  Procedures: Incision, drainage and sharp debridement left buttock and perineal abscess with findings of abscess and gaseous gangrene-04/03/2012 by Dr. Ninfa Linden  Cultures:  9/2 abscess culture>> multiple species  9/2 wet prep >> clue cells  9/2 blood culture >>  9/2 procalcitonin >>0.19  Antibiotics: 9/2 vanc >>9/2  9/2 Zosyn >> 9/4 9/2 clinda >>9/4 augmentin 9/6>>  HPI/Subjective: Sts dizziness slightly better after patching right eye.   Objective: Blood pressure 167/68, pulse 83, temperature 98.8 F (37.1 C), temperature source Oral, resp. rate 16, height 5\' 3"  (1.6 m), weight 121.564 kg (268 lb), SpO2 97.00%.  Intake/Output Summary (Last 24 hours) at 04/12/12 1248 Last data filed at 04/11/12 1700  Gross per 24 hour  Intake    240 ml  Output   1200 ml  Net   -960 ml     Exam: General: No acute respiratory distress at rest  Lungs: distant bs th/o - mild bibasilar crackles - no wheeze  Cardiovascular: Regular rate and rhythm without murmur gallop or rub normal S1 and S2 Abdomen: morbidly obese, soft, bs+, no rebound Extremities: 1+ B LE edema w/o cyanosis or clubbing Neurological: Currently while seated in the bed no complaints of dizziness, no focal neurological abnormalities appreciated  Data Reviewed: Basic  Metabolic Panel:  Lab [id number] 04/10/12 0345 04/06/12 0435  NA 134* 134* 134*  K 4.1 3.2* 3.7  CL 100 98 101  CO2 25 28 24   GLUCOSE 132* 92 224*  BUN 9 8 9   CREATININE 0.91 0.96 0.95  CALCIUM 9.6 9.1 8.4  MG -- -- --  PHOS -- -- --   CBC:  Lab 04/10/12 0345 04/06/12 1140  WBC 13.2* 13.7*  NEUTROABS -- --  HGB 8.2* 8.3*  HCT 24.9* 25.4*  MCV 77.3* 77.7*  PLT 261 337   CBG:  Lab 04/12/12 1153 04/12/12 0822 04/11/12 2124 04/11/12 1746 04/11/12 1221  GLUCAP 203* 199* 145* 270* 149*    Recent Results (from the past 240 hour(s))  WET PREP, GENITAL     Status: Abnormal   Collection Time   04/03/12  9:51 AM      Component Value Range Status Comment   Yeast Wet Prep HPF POC NONE SEEN  NONE SEEN Final    Trich, Wet Prep NONE SEEN  NONE SEEN Final    Clue Cells Wet Prep HPF POC MODERATE (*) NONE SEEN Final    WBC, Wet Prep HPF POC FEW (*) NONE SEEN Final   CULTURE, BLOOD (ROUTINE X 2)     Status: Normal   Collection Time   04/03/12 10:33 AM      Component Value Range Status Comment   Specimen Description BLOOD LEFT ARM   Final    Special Requests BOTTLES DRAWN AEROBIC AND ANAEROBIC 10CC   Final    Culture  Setup Time 04/03/2012 19:05   Final    Culture NO GROWTH 5 DAYS   Final    Report Status 04/09/2012 FINAL  Final   CULTURE, BLOOD (ROUTINE X 2)     Status: Normal   Collection Time   04/03/12 10:45 AM      Component Value Range Status Comment   Specimen Description BLOOD LEFT HAND   Final    Special Requests BOTTLES DRAWN AEROBIC AND ANAEROBIC 5CC   Final    Culture  Setup Time 04/03/2012 19:06   Final    Culture NO GROWTH 5 DAYS   Final    Report Status 04/09/2012 FINAL   Final   CULTURE, ROUTINE-ABSCESS     Status: Normal   Collection Time   04/03/12  2:01 PM      Component Value Range Status Comment   Specimen Description ABSCESS PERIRECTAL   Final    Special Requests PATIENT ON FOLLOWING VANCOMYCIN,ZOSYN,CLEOCIN   Final    Gram Stain     Final    Value: NO WBC  SEEN     NO SQUAMOUS EPITHELIAL CELLS SEEN     FEW GRAM POSITIVE COCCI IN PAIRS     RARE GRAM POSITIVE RODS   Culture RARE ESCHERICHIA COLI   Final    Report Status 04/07/2012 FINAL   Final    Organism ID, Bacteria ESCHERICHIA COLI   Final   ANAEROBIC CULTURE     Status: Normal   Collection Time   04/03/12  2:01 PM      Component Value Range Status Comment   Specimen Description ABSCESS PERIRECTAL   Final    Special Requests PATIENT ON FOLLOWING VANCOMYCIN,ZOSYN,CLEOCIN   Final    Gram Stain     Final    Value: NO WBC SEEN     NO SQUAMOUS EPITHELIAL CELLS SEEN     FEW GRAM POSITIVE COCCI IN PAIRS     FEW GRAM POSITIVE RODS   Culture NO ANAEROBES ISOLATED   Final    Report Status 04/08/2012 FINAL   Final   MRSA PCR SCREENING     Status: Normal   Collection Time   04/03/12  6:28 PM      Component Value Range Status Comment   MRSA by PCR NEGATIVE  NEGATIVE Final      Studies:  Recent x-ray studies have been reviewed in detail by the Attending Physician  Scheduled Meds:  Reviewed in detail by the Attending Physician   Erin Hearing, ANP [facility] Office  [phone] Pager [phone]  On-Call/Text Page:      [email or web address]      password [facility]  04/12/2012, 12:48 PM   LOS: 9 days   I have personally examined this patient and reviewed the entire database. I have reviewed the above note, made any necessary editorial changes, and agree with its content.  Cherene Altes, MD [facility]

## 2012-04-13 LAB — GLUCOSE, CAPILLARY: Glucose-Capillary: 137 mg/dL — ABNORMAL HIGH (ref 70–99)

## 2012-04-13 MED ORDER — HYDROCODONE-ACETAMINOPHEN 5-325 MG PO TABS: 1.0000 | ORAL_TABLET | ORAL | Status: AC | PRN

## 2012-04-13 MED ORDER — ATORVASTATIN CALCIUM 20 MG PO TABS: 20.0000 mg | ORAL_TABLET | Freq: Every day | ORAL | Status: DC

## 2012-04-13 MED ORDER — BLOOD GLUCOSE MONITORING SUPPL W/DEVICE KIT: 1.0000 | PACK | Freq: Three times a day (TID) | Status: DC

## 2012-04-13 NOTE — Discharge Summary (Signed)
Physician Discharge Summary  Patient ID: [name] MRN: [id number] DOB/AGE: 57-28-1957 57 y.o.  Admit date: 04/03/2012 Discharge date: 04/13/2012  Admission Diagnoses: Buttock pain, sepsis  Discharge Diagnoses:  Principal Problem:  *Sepsis due to Escherichia coli Active Problems:  History of chronic Iron defeicency anemia with acute post op anemia, transfused after debridment 04/04/12  Proliferative diabetic retinopathy  HTN, on multiple medications prior to admission.  CAD, cath 2010, turned down for CABG "poor targets"  GERD  Obesity, morbid  Diabetes mellitus type 2, uncontrolled  Gangrenous Perirectal abscess s/p I/D and debridement 9/2  NSTEMI  post-op 04/04/12  Presumed OSA (obstructive sleep apnea)  Bacterial vaginosis  Chronic diastolic heart failure, NYHA class 1  Hypokalemia  Discharged Condition: stable  Hospital Course: 56 year old female with multiple comorbidities presented with 2 day history of worsening perianal discomfort and drainage. 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 Patient was taken to the OR on 04/03/12 for I&D with debridement of deep abscess. Post-operatively, critical care was consulted given her obesity and respiratory issues. She subseqently developed chest pain on 04/04/12 and cardiology was consulted. It was believed that CP may have been related to demand ischemia secondary to sepsis/tachycardia and know CAD in the post-operative period. She was not taken to cath, and her CE improved. After moving from ICU, her medical problems were managed by hospitalist service. From a surgical standpoint, her diet was advanced. She tolerated BID dressing changes well, which should continue wet to dry twice daily at discharge. Her surgical cultures grew pan-sensitive E.coli. She was kept on Zosyn, then transitioned to PO  Augmentin while in the hospital. She will not be on antibiotics at discharge. Pain is well controlled at this time, but she should continue Percocet prn especially around dressing changes. She will follow up in surgery clinic in 2 weeks.   Of note, her diabetes is relatively uncontrolled. Her insulin regimen should be titrated for better control. She will need a new meter with test strips. She will also need to follow up with Cardiology given her recent elevation in cardiac enzymes in the hospital. She is pain free at time of discharge.  Consults: cardiology, pulmonary/intensive care, general surgery and internal medicine  Significant Diagnostic Studies:  Ct Abdomen Pelvis Wo Contrast 04/03/2012  *RADIOLOGY REPORT*  Clinical Data: Diabetic with left buttock abscess.  CT ABDOMEN AND PELVIS WITHOUT CONTRAST  Technique:  Multidetector CT imaging of the abdomen and pelvis was performed following the standard protocol without intravenous contrast.  Comparison: None.  Findings: Gas-forming infectious process is identified in the subcutaneous fat of the left medial buttocks.  Gas extends deep into the subcutaneous fat and approaches the perineum.  There is no evidence of extension into the peritoneal cavity.  Soft tissue gas and surrounding inflammatory changes are seen without evidence of a significant component of fluid or focal drainable abscess.  The liver, gallbladder, pancreas, spleen, adrenal glands and kidneys are unremarkable by unenhanced CT.  Bowel loops are normal caliber.  The bladder is unremarkable.  Uterus and adnexal regions are unremarkable by CT.  IMPRESSION: Gas-forming infectious process in the subcutaneous fat of the medial left buttocks.  Gas approaches the perineum.  No evidence of focal fluid collection or drainable abscess by CT.   Original Report Authenticated By: Azzie Roup, M.D.    Dg Chest  Port 1 View 04/03/2012  *RADIOLOGY REPORT*  Clinical Data: Central venous catheter placement   PORTABLE CHEST - 1 VIEW  Comparison: 04/03/2012  Findings: There is a left subclavian catheter with tip in the projection of the SVC.  No pneumothorax identified.  Heart size appears enlarged.  There is no pleural effusion or edema identified.  No airspace consolidation identified.  IMPRESSION:  1.  No pneumothorax after catheter placement.   Original Report Authenticated By: Angelita Ingles, M.D.    Treatments: IV hydration, antibiotics: Zosyn and surgery: I&D with debridement of abscess  Discharge Exam: Blood pressure 157/80, pulse 81, temperature 98.1 F (36.7 C), temperature source Oral, resp. rate 18, height 5\' 3"  (1.6 m), weight 268 lb (121.564 kg), SpO2 100.00%. Gen: In bed, awake, alert, NAD HEENT: Right eye patch Chest: Pressure dressing on left chest from prior central line Abd: Soft, nontender Wound: Dressing saturated, scat blood. Well packed. Borders without necrosis or eschar   Disposition: 01-Home or Self Care      Discharge Orders    Future Appointments: Provider: Department: Dept Phone: Center:   04/19/2012 8:00 PM [facility] Tonette Bihari [phone] [facility]     Future Orders Please Complete By Expires   Diet - low sodium heart healthy      Increase activity slowly      Discharge wound care:      Comments:   BID dressing changes, wet to dry. Please ensure the entire wound is well packed.   Call MD for:  temperature >100.4      Call MD for:  persistant nausea and vomiting      Call MD for:  severe uncontrolled pain      Call MD for:  redness, tenderness, or signs of infection (pain, swelling, redness, odor or green/yellow discharge around incision site)      Call MD for:  difficulty breathing, headache or visual disturbances      Call MD for:  persistant dizziness or light-headedness      Call MD for:  extreme fatigue      Split night study   04/06/13   Comments:   Very likely has sleep apnea, rec split night study   Questions: Responses:   Where should  this test be performed: [facility]       Medication List     As of 04/13/2012 11:19 AM    STOP taking these medications         ezetimibe-simvastatin 10-40 MG per tablet   Commonly known as: VYTORIN      naproxen sodium 220 MG tablet   Commonly known as: ANAPROX      oxycodone 5 MG capsule   Commonly known as: OXY-IR      TAKE these medications         acetaminophen 500 MG tablet   Commonly known as: TYLENOL   Take 500 mg by mouth every 6 (six) hours as needed. For pain      aspirin 81 MG tablet   Take 81 mg by mouth daily.      atorvastatin 20 MG tablet   Commonly known as: LIPITOR   Take 1 tablet (20 mg total) by mouth daily at 6 PM.      atropine 1 % ophthalmic solution   Place 1 drop into the right eye 2 (two) times daily.      bisacodyl 5 MG EC tablet   Commonly known as: DULCOLAX   Take 5  mg by mouth daily as needed. For constipation      bismuth subsalicylate 99991111 [id number] suspension   Commonly known as: PEPTO BISMOL   Take 15 mLs by mouth 2 (two) times daily as needed.      Blood Glucose Monitoring Suppl W/DEVICE Kit   1 each by Does not apply route 3 (three) times daily.      diltiazem 240 MG 24 hr capsule   Commonly known as: DILACOR XR   Take 240 mg by mouth daily.      furosemide 40 MG tablet   Commonly known as: LASIX   Take 40 mg by mouth 2 (two) times daily.      guaiFENesin-dextromethorphan 100-10 MG/5ML syrup   Commonly known as: ROBITUSSIN DM   Take 5 mLs by mouth at bedtime as needed. For cough      hydrALAZINE 50 MG tablet   Commonly known as: APRESOLINE   Take 50 mg by mouth 2 (two) times daily.      HYDROcodone-acetaminophen 5-325 MG per tablet   Commonly known as: NORCO/VICODIN   Take 1-2 tablets by mouth every 4 (four) hours as needed.      insulin lispro protamine-insulin lispro (75-25) 100 UNIT/ML Susp   Commonly known as: HUMALOG 75/25   Inject 65 Units into the skin 2 (two) times daily with a meal.       isosorbide mononitrate 60 MG 24 hr tablet   Commonly known as: IMDUR   Take 60 mg by mouth daily.      losartan-hydrochlorothiazide 100-25 MG per tablet   Commonly known as: HYZAAR   Take 1 tablet by mouth daily.      medroxyPROGESTERone 10 MG tablet   Commonly known as: PROVERA   Take 10 mg by mouth daily as needed. Takes only when bleeding      metoprolol 200 MG 24 hr tablet   Commonly known as: TOPROL-XL   Take 200 mg by mouth daily.      nitroGLYCERIN 0.4 MG SL tablet   Commonly known as: NITROSTAT   Place 0.4 mg under the tongue every 5 (five) minutes as needed. For chest pain      pantoprazole 40 MG tablet   Commonly known as: PROTONIX   Take 40 mg by mouth daily.      polyvinyl alcohol 1.4 % ophthalmic solution   Commonly known as: LIQUIFILM TEARS   Place 1 drop into both eyes daily as needed. For dry eyes      potassium chloride 10 MEQ tablet   Commonly known as: K-DUR,KLOR-CON   Take 10 mEq by mouth daily.      ranolazine 500 MG 12 hr tablet   Commonly known as: RANEXA   Take 500 mg by mouth 2 (two) times daily.         Follow-up Information    Follow up with [facility]. On 04/19/2012. (Please arrive at 8 PM for your sleep study. You will receive additional information by mail or by phone call from the sleep center prior to your study)    Contact information:   [address], [place]       Follow up with Sanda Klein, MD on 05/18/2012. (8:30am)    Contact information:   [place] [phone]       Follow up with [facility], MD. Schedule an appointment as soon as possible for a visit in  2 weeks. (Continue wet to dry dressing in Nursing facility twice a day.)    Contact information:   [address] [place] [address] [phone]        Signed: Melrose Nakayama 04/13/2012, 11:19 AM

## 2012-04-13 NOTE — Progress Notes (Signed)
1330 report given to [facility] , nurse from [facility]  facility

## 2012-04-13 NOTE — Progress Notes (Signed)
Physical Therapy Treatment Patient Details Name: [name] MRN: [id number] DOB: November 19, 1955 Today's Date: 04/13/2012 Time: [id number] PT Time Calculation (min): 24 min  PT Assessment / Plan / Recommendation Comments on Treatment Session  Patient s/p sepsis, buttock abscess and vertigo.  Goal of treatment today was to ambulate.  Ambulated a good distance.  Patient fatigued after ambulation and wanted to rest prior to reassessment of vertigo.  Patient does report decr in symptoms since treatment yesterday but symptoms still not fully resolved.  Called another therapist to check on patient this pm as able.      Follow Up Recommendations  Skilled nursing facility;Supervision/Assistance - 24 hour    Barriers to Discharge        Equipment Recommendations  3 in 1 bedside comode    Recommendations for Other Services    Frequency Min 3X/week   Plan Discharge plan remains appropriate;Frequency remains appropriate    Precautions / Restrictions Precautions Precautions: Fall Restrictions Weight Bearing Restrictions: No   Pertinent Vitals/Pain VSS, No pain    Mobility  Bed Mobility Bed Mobility: Not assessed Transfers Transfers: Sit to Stand;Stand to Sit Sit to Stand: 4: Min assist;With upper extremity assist;With armrests;From chair/3-in-1 Stand to Sit: 4: Min assist;With upper extremity assist;To chair/3-in-1 Stand Pivot Transfers: Not tested (comment) Details for Transfer Assistance: Needed cues for hand placement.  Needed assist to control descent as well.   Ambulation/Gait Ambulation/Gait Assistance: 1: +2 Total assist Ambulation/Gait: Patient Percentage: 70% (+ 1 follow with chair) Ambulation Distance (Feet): 26 Feet Assistive device: Rolling walker Ambulation/Gait Assistance Details: Ambulated 20 feet and then 6 feet with RW.  Cues for sequencing steps and RW and to stay close to RW.  Moves very slowly.  No knee buckling noted today, however patient did get "weak" and shaky  second attempt at ambulation and need to sit down and rest.  Refused further ambulation after that.   Gait Pattern: Decreased stride length;Wide base of support;Trunk flexed;Shuffle;Step-to pattern Stairs: No Architect: No     PT Goals Acute Rehab PT Goals PT Goal: Sit to Stand - Progress: Progressing toward goal PT Goal: Stand to Sit - Progress: Progressing toward goal PT Goal: Ambulate - Progress: Progressing toward goal  Visit Information  Last PT Received On: 04/13/12 Assistance Needed: +2    Subjective Data  Subjective: "I feel better but still dizzy at times."   Cognition  Overall Cognitive Status: Appears within functional limits for tasks assessed/performed Arousal/Alertness: Awake/alert Orientation Level: Appears intact for tasks assessed Behavior During Session: [facility] for tasks performed    Balance     End of Session PT - End of Session Equipment Utilized During Treatment: Gait belt Activity Tolerance: Patient tolerated treatment well Patient left: in chair;with call bell/phone within reach Nurse Communication: Mobility status        [name],[name] 04/13/2012, 12:34 PM  [facility] Acute Rehabilitation [phone] (pager)

## 2012-04-13 NOTE — Progress Notes (Addendum)
Clinical Social Work  CSW faxed [employer] and medications to SNF ([facility]). CSW confirmed that SNF could accept patient. CSW informed patient and RN of dc and both were agreeable. CSW prepared dc packet and coordinated transportation via [facility]. CSW submitted Medicaid authorization via [place]. Confirmation # [id number] W.  CSW is signing off.  [place], [place] [phone]

## 2012-04-13 NOTE — Progress Notes (Deleted)
1235 to or via bed with transporter

## 2012-04-13 NOTE — Progress Notes (Signed)
[place] = 187 3 units humog insulin given to l deltoid ,  1200 not was charted wrong as not given

## 2012-04-18 ENCOUNTER — Emergency Department (HOSPITAL_COMMUNITY): Payer: Medicare Other

## 2012-04-18 ENCOUNTER — Emergency Department (HOSPITAL_COMMUNITY)
Admission: EM | Admit: 2012-04-18 | Discharge: 2012-04-18 | Disposition: A | Discharge status: Home / Self Care | Payer: Medicare Other | Attending: Emergency Medicine | Admitting: Emergency Medicine

## 2012-04-18 ENCOUNTER — Other Ambulatory Visit: Payer: Self-pay

## 2012-04-18 ENCOUNTER — Encounter (HOSPITAL_COMMUNITY): Payer: Self-pay | Admitting: *Deleted

## 2012-04-18 DIAGNOSIS — M25519 Pain in unspecified shoulder: Secondary | ICD-10-CM | POA: Diagnosis present

## 2012-04-18 DIAGNOSIS — D649 Anemia, unspecified: Secondary | ICD-10-CM

## 2012-04-18 DIAGNOSIS — I2510 Atherosclerotic heart disease of native coronary artery without angina pectoris: Secondary | ICD-10-CM | POA: Diagnosis present

## 2012-04-18 DIAGNOSIS — R0602 Shortness of breath: Secondary | ICD-10-CM

## 2012-04-18 DIAGNOSIS — E113599 Type 2 diabetes mellitus with proliferative diabetic retinopathy without macular edema, unspecified eye: Secondary | ICD-10-CM | POA: Diagnosis present

## 2012-04-18 DIAGNOSIS — R61 Generalized hyperhidrosis: Secondary | ICD-10-CM

## 2012-04-18 DIAGNOSIS — I10 Essential (primary) hypertension: Secondary | ICD-10-CM | POA: Insufficient documentation

## 2012-04-18 DIAGNOSIS — R079 Chest pain, unspecified: Secondary | ICD-10-CM

## 2012-04-18 DIAGNOSIS — E119 Type 2 diabetes mellitus without complications: Secondary | ICD-10-CM | POA: Insufficient documentation

## 2012-04-18 DIAGNOSIS — Z794 Long term (current) use of insulin: Secondary | ICD-10-CM | POA: Insufficient documentation

## 2012-04-18 DIAGNOSIS — M5412 Radiculopathy, cervical region: Secondary | ICD-10-CM

## 2012-04-18 DIAGNOSIS — N179 Acute kidney failure, unspecified: Secondary | ICD-10-CM

## 2012-04-18 HISTORY — DX: Type 2 diabetes mellitus with unspecified diabetic retinopathy without macular edema: E11.319

## 2012-04-18 HISTORY — DX: Chronic diastolic (congestive) heart failure: I50.32

## 2012-04-18 LAB — BASIC METABOLIC PANEL
CO2: 25 mEq/L (ref 19–32)
Calcium: 8.9 mg/dL (ref 8.4–10.5)
Creatinine, Ser: 1.51 mg/dL — ABNORMAL HIGH (ref 0.50–1.10)
Glucose, Bld: 462 mg/dL — ABNORMAL HIGH (ref 70–99)

## 2012-04-18 LAB — CBC WITH DIFFERENTIAL/PLATELET
Eosinophils Absolute: 0 10*3/uL (ref 0.0–0.7)
Eosinophils Relative: 0 % (ref 0–5)
HCT: 26.1 % — ABNORMAL LOW (ref 36.0–46.0)
Lymphocytes Relative: 18 % (ref 12–46)
Lymphs Abs: 1.8 10*3/uL (ref 0.7–4.0)
MCH: 25.8 pg — ABNORMAL LOW (ref 26.0–34.0)
MCV: 79.3 fL (ref 78.0–100.0)
Monocytes Absolute: 0.5 10*3/uL (ref 0.1–1.0)
RDW: 17.2 % — ABNORMAL HIGH (ref 11.5–15.5)
WBC: 10 10*3/uL (ref 4.0–10.5)

## 2012-04-18 LAB — GLUCOSE, CAPILLARY: Glucose-Capillary: 379 mg/dL — ABNORMAL HIGH (ref 70–99)

## 2012-04-18 MED ORDER — ONDANSETRON HCL 4 MG/2ML IJ SOLN
4.0000 mg | Freq: Once | INTRAMUSCULAR | Status: AC
  Administered 2012-04-18: 4 mg via INTRAVENOUS
  Filled 2012-04-18: qty 2

## 2012-04-18 MED ORDER — SODIUM CHLORIDE 0.9 % IV SOLN
Freq: Once | INTRAVENOUS | Status: AC
Start: 2012-04-18 — End: 2012-04-18
  Administered 2012-04-18: 18:00:00 via INTRAVENOUS

## 2012-04-18 MED ORDER — IOHEXOL 350 MG/ML SOLN
100.0000 mL | Freq: Once | INTRAVENOUS | Status: AC | PRN
  Administered 2012-04-18: 100 mL via INTRAVENOUS

## 2012-04-18 MED ORDER — MORPHINE SULFATE 4 MG/ML IJ SOLN
4.0000 mg | Freq: Once | INTRAMUSCULAR | Status: AC
  Administered 2012-04-18: 4 mg via INTRAVENOUS
  Filled 2012-04-18: qty 1

## 2012-04-18 MED ORDER — IBUPROFEN 800 MG PO TABS: 800.0000 mg | ORAL_TABLET | Freq: Three times a day (TID) | ORAL | Status: DC | PRN

## 2012-04-18 NOTE — Consult Note (Addendum)
Pt. Seen and examined. Agree with the NP/PA-C note as written. 56 yo female with known CAD and prior CT surgery consult, not felt to be a good bypass candidate. Recently hospitalized for peri-anal abscess. No recovering. She is complaining of left shoulder pain, mostly over the left scapula which is sharp, worsened with motion and tender to palpation. EKG is unchanged. CT angiogram was negative for PE or dissection. Troponin is negative. I doubt this is a new acute coronary syndrome. She could use a work-up for musculoskeletal causes of her left shoulder pain. Creatinine is mildly elevated and she may benefit from IVF's in addition to pain medications.  Thanks for the consult. We will follow-up with her as scheduled in the office as an outpatient.  Pixie Casino, MD, [facility] Attending Cardiologist [facility]

## 2012-04-18 NOTE — ED Notes (Signed)
Pt up and ambulatory to the bedside commode with minimal assistance, sts she felt extremely dizzy and lightheaded

## 2012-04-18 NOTE — ED Provider Notes (Signed)
History     CSN: [id number]  Arrival date & time 04/18/12  1140   First MD Initiated Contact with Patient 04/18/12 1156      Chief Complaint  Patient presents with  . Chest Pain    Patient is a 56 year old female who presents the emergency department with complaints of left sided chest pain.  Pain began around 2:30 AM and has been constant. Patient had just gotten up to go to the bathroom at onset. Quality is described as sharp with intermittent tightness. Severity is 7/10 currently and prior to administration of aspirin and nitroglycerin pain was 10 out of 10. Associated symptoms include nausea, intermittent shortness of breath, and intermittent bouts of diaphoresis. Patient states that she has had a heart attack in the past and felt similar to this.  (Consider location/radiation/quality/duration/timing/severity/associated sxs/prior treatment) Patient is a 56 y.o. female presenting with chest pain. The history is provided by the patient and medical records. No language interpreter was used.  Chest Pain The chest pain began 6 - 12 hours ago. Chest pain occurs constantly. The chest pain is improving. The pain is associated with exertion. At its most intense, the pain is at 10/10. The pain is currently at 7/10. The severity of the pain is severe. The quality of the pain is described as sharp. The pain radiates to the left arm. Primary symptoms include shortness of breath and nausea.  Associated symptoms include diaphoresis. She tried nitroglycerin and aspirin for the symptoms. Risk factors include obesity and sedentary lifestyle.  Her past medical history is significant for CAD.  Procedure history is positive for cardiac catheterization.     Past Medical History  Diagnosis Date  . Diabetes mellitus   . Hypertension   . Hyperlipemia   . Coronary artery disease   . Headache   . Chest pain at rest 01/15/2012  . Nausea and vomiting, secondary to viral syndrome 01/17/2012  . Anemia   .  Obstructive sleep apnea   . Diabetic retinopathy   . Chronic diastolic heart failure     Past Surgical History  Procedure Date  . Cardiac catheterization   . Incision and drainage perirectal abscess 04/03/2012    Procedure: IRRIGATION AND DEBRIDEMENT PERIRECTAL ABSCESS;  Surgeon: Harl Bowie, MD;  Location: [facility];  Service: General;  Laterality: Left;    No family history on file.  History  Substance Use Topics  . Smoking status: Never Smoker   . Smokeless tobacco: Not on file  . Alcohol Use: No    OB History    Grav Para Term Preterm Abortions TAB SAB Ect Mult Living                  Review of Systems  Constitutional: Positive for diaphoresis.  Respiratory: Positive for shortness of breath.   Cardiovascular: Positive for chest pain.  Gastrointestinal: Positive for nausea.  All other systems reviewed and are negative.    Allergies  Lorazepam and Orange fruit  Home Medications   Current Outpatient Rx  Name Route Sig Dispense Refill  . ACETAMINOPHEN 500 MG PO TABS Oral Take 500 mg by mouth every 6 (six) hours as needed. For pain    . ASPIRIN 81 MG PO TABS Oral Take 81 mg by mouth daily.    . ATORVASTATIN CALCIUM 20 MG PO TABS Oral Take 1 tablet (20 mg total) by mouth daily at 6 PM.    . ATROPINE SULFATE 1 % OP SOLN Right Eye Place 1 drop into  the right eye 2 (two) times daily.    Marland Kitchen BISACODYL 5 MG PO TBEC Oral Take 5 mg by mouth daily as needed. For constipation    . DILTIAZEM HCL ER 240 MG PO CP24 Oral Take 240 mg by mouth daily.    . FUROSEMIDE 40 MG PO TABS Oral Take 40 mg by mouth 2 (two) times daily.     Marland Kitchen HYDRALAZINE HCL 50 MG PO TABS Oral Take 50 mg by mouth 2 (two) times daily.    Marland Kitchen HYDROCODONE-ACETAMINOPHEN 5-325 MG PO TABS Oral Take 1-2 tablets by mouth every 4 (four) hours as needed. 60 tablet 0  . INSULIN LISPRO PROT & LISPRO (75-25) 100 UNIT/ML Whitney SUSP Subcutaneous Inject 65 Units into the skin 2 (two) times daily with a meal.     . ISOSORBIDE  MONONITRATE ER 60 MG PO TB24 Oral Take 60 mg by mouth daily.    Marland Kitchen LOSARTAN POTASSIUM-HCTZ 100-25 MG PO TABS Oral Take 1 tablet by mouth daily.    Marland Kitchen MEDROXYPROGESTERONE ACETATE 10 MG PO TABS Oral Take 10 mg by mouth daily as needed. Takes only when bleeding    . METOPROLOL SUCCINATE ER 200 MG PO TB24 Oral Take 200 mg by mouth daily.    Marland Kitchen NITROGLYCERIN 0.4 MG SL SUBL Sublingual Place 0.4 mg under the tongue every 5 (five) minutes as needed. For chest pain    . PANTOPRAZOLE SODIUM 40 MG PO TBEC Oral Take 40 mg by mouth daily.    Marland Kitchen POLYVINYL ALCOHOL 1.4 % OP SOLN Both Eyes Place 1 drop into both eyes daily as needed. For dry eyes    . POTASSIUM CHLORIDE CRYS ER 10 MEQ PO TBCR Oral Take 10 mEq by mouth daily.    Marland Kitchen RANOLAZINE ER 500 MG PO TB12 Oral Take 500 mg by mouth 2 (two) times daily.    Marland Kitchen BLOOD GLUCOSE MONITORING SUPPL W/DEVICE KIT Does not apply 1 each by Does not apply route 3 (three) times daily. 1 each 0    Please supply patient with blood glucose monitor s ...    BP 151/71  Pulse 95  Temp 98.5 F (36.9 C) (Oral)  Resp 18  SpO2 100%  Physical Exam  Nursing note and vitals reviewed. Constitutional: She is oriented to person, place, and time. She appears well-developed and well-nourished. No distress.  HENT:  Head: Normocephalic and atraumatic.  Right Ear: External ear normal.  Left Ear: External ear normal.  Nose: Nose normal.  Mouth/Throat: Oropharynx is clear and moist.  Eyes: Conjunctivae normal and EOM are normal. Pupils are equal, round, and reactive to light.  Neck: Normal range of motion. Neck supple.  Cardiovascular: Normal rate, regular rhythm, normal heart sounds and intact distal pulses.   Pulmonary/Chest: Effort normal and breath sounds normal. No respiratory distress. She has no wheezes. She has no rales. She exhibits tenderness (mild TTP over left anterior chest).  Abdominal: Soft. Bowel sounds are normal. She exhibits no distension and no mass. There is no  tenderness. There is no rebound and no guarding.  Musculoskeletal:       Patient reports mild pain with A/PROM of left shoulder; no obvious deformity present;  Neurological: She is alert and oriented to person, place, and time. She has normal reflexes. No cranial nerve deficit.  Skin: Skin is warm and dry.  Psychiatric: She has a normal mood and affect.    ED Course  Procedures (including critical care time)  Labs Reviewed  CBC WITH DIFFERENTIAL -  Abnormal; Notable for the following:    RBC 3.29 (*)     Hemoglobin 8.5 (*)     HCT 26.1 (*)     MCH 25.8 (*)     RDW 17.2 (*)     Platelets 488 (*)     All other components within normal limits  BASIC METABOLIC PANEL - Abnormal; Notable for the following:    Sodium 130 (*)     Chloride 93 (*)     Glucose, Bld 462 (*)     Creatinine, Ser 1.51 (*)     GFR calc non Af Amer 37 (*)     GFR calc Af Amer 43 (*)     All other components within normal limits  TROPONIN I   Ct Angio Chest Pe W/cm &/or Wo Cm  04/18/2012  *RADIOLOGY REPORT*  Clinical Data: 56 year old female pleuritic chest pain.  Recent hospitalization for pelvic abscess.  Left shoulder pain.  CT ANGIOGRAPHY CHEST  Technique:  Multidetector CT imaging of the chest using the standard protocol during bolus administration of intravenous contrast. Multiplanar reconstructed images including MIPs were obtained and reviewed to evaluate the vascular anatomy.  Contrast: 161mL OMNIPAQUE IOHEXOL 350 MG/ML SOLN  Comparison: Chest CTA 05/15/2009.  Findings: Good contrast bolus timing in the pulmonary arterial tree.  Intermittent respiratory motion artifact, especially the lower lobes. No focal filling defect identified in the pulmonary arterial tree to suggest the presence of acute pulmonary embolism.  Atelectatic changes to the major airways which otherwise are patent.  Mild atelectasis in both costophrenic angles.  No consolidation.  Cardiomegaly.  No pericardial or pleural effusion.  Stable  visualized aorta with mild atherosclerosis.  Negative visualized upper abdominal viscera.  No mediastinal lymphadenopathy.  Coronary artery atherosclerosis.  Negative thoracic inlet.  Degenerative changes in the spine. No acute osseous abnormality identified.  IMPRESSION: 1. No evidence of acute pulmonary embolus. 2.  Mild pulmonary atelectasis. 3.  Cardiomegaly and atherosclerosis.   Original Report Authenticated By: Randall An, M.D.      Date: 04/18/2012  Rate: 98  Rhythm: normal sinus rhythm  QRS Axis: normal  Intervals: normal  ST/T Wave abnormalities: nonspecific T wave changes  Conduction Disutrbances:none  Narrative Interpretation:   Old EKG Reviewed: unchanged   1. Chest pain   2. Shortness of breath   3. Diaphoresis   4. Acute renal failure   5. Anemia       MDM    Patient is a 56 year old female with known coronary artery disease who presents to emergency department with complaints of chest pain.  Pain began after walking to the bathroom this morning and has been constant. Patient recently admitted for I&D of perianal abscess.  AF and VS remarkable for HR in the 90s but otherwise unremarkable. On exam, patient with minimal TTP over anterior left chest and mild pain with A/PROM of left shoulder.  Due to recent hospitalization/surgery, PMH, and clinical presentation there was concern for ACS versus PE.  EKG showed NSR and unchanged from prior.  CXR showed cardiomegaly but unchanged from prior.  Labs, including troponin, remarkable for mildly elevated Cr.  CT PE study felt to be warranted, and negative for PE study.  For further evaluation of patient's CP and ACS rule out [facility] was consulted.  While awaiting results from [facility] patient to be moved to CDU.  Please see mid level provider note's for further details of patient care.  Corlis Leak, MD 04/18/12 [phone]

## 2012-04-18 NOTE — ED Notes (Signed)
IV team at bedside 

## 2012-04-18 NOTE — ED Notes (Signed)
IV attempted X2 with no success, IV team paged

## 2012-04-18 NOTE — ED Provider Notes (Signed)
[facility] consult notes reviewed.  Unlikely current symptoms are of cardiac origin.  Patient states at present her pain in completely gone.   Upon questioning, patient states her pain originates at base of neck with radiation to left arm.  No prior neck injuries or history of degenerative disc disease.  Will discharge back to rehab center.  Patient may trial ibuprofen for pain management if it returns.  No current PCP ([facility]), is followed by [facility] and has an appointment in three weeks.   Norman Herrlich, NP 04/18/12 2212

## 2012-04-18 NOTE — ED Notes (Addendum)
From [facility] home - pt c/o CP onset 0900. Given NTG x3, ASA 324mg  by staff with minimal relief. Pt recently discharged, S/P I&D of perianal abscess & had then developed CP. No cardiac cath done

## 2012-04-18 NOTE — ED Provider Notes (Signed)
56 year old female comes in with chest pain which she woke up at 2:30 this morning. Pain was severe and rated at 10/10 but is improved to 7/10. There is no associated nausea or vomiting or dyspnea or diaphoresis. Catheterization in the past has shown severe coronary artery disease. Her ECG is unremarkable. She will need to cardiac evaluation today.   Date: 04/18/2012  Rate: 98  Rhythm: normal sinus rhythm  QRS Axis: normal  Intervals: normal  ST/T Wave abnormalities: nonspecific ST/T changes  Conduction Disutrbances:none  Narrative Interpretation: Minor non-specific ST-T changes. When compared to ECG of 01/04/2012, no significant changes are seen.  Old EKG Reviewed: unchanged    Delora Fuel, MD [id number]

## 2012-04-18 NOTE — ED Provider Notes (Signed)
Medical screening examination/treatment/procedure(s) were performed by non-physician practitioner and as supervising physician I was immediately available for consultation/collaboration.   Charles B. Karle Starch, MD 04/18/12 2317

## 2012-04-18 NOTE — Consult Note (Signed)
Reason for Consult: CP Referring Physician:   JULISSA TARBOX is an 56 y.o. female.  HPI:   The patient is a 56 yo morbidly obese AA female.  Her last cardiac cath in July of 2010 revealed diffuse right coronary, circumflex and first diagonal disease. She had a TCTS consult with Dr. Caffie Pinto and he did not feel she would be amenable to surgery due to poor targets. Since that time she has been managed medically. 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 She was last seen in the office on Mar 23, 2012. She presented on Sept. 2 with sepsis secondary to gas gangrene and severe abscess on her left buttock.  She is currently residing at [facility] for wound healing.  She presents today with left shoulder/scapulae pain which radiates to the left side of her neck along with left arm numbness, nausea, diaphoresis.  The pain started at 0230 hrs.  It was 10/10.  It was worse with movement.  She describes the pain as "deep pressure", "sharp and made me catch my breath".  She also reports a headache, itchy feet, period cramps and fast heart rate.  She denies orthopnea, PND, fever, vomiting, LEE, dysuria, hematuria, hematochezia, melena.  EKG shows no acute changes compared to previous and troponin is negative.  Past Medical History  Diagnosis Date  . Diabetes mellitus   . Hypertension   . Hyperlipemia   . Coronary artery disease   . Headache   . Chest pain at rest 01/15/2012  . Nausea and vomiting, secondary to viral syndrome 01/17/2012  . Anemia   . Obstructive sleep apnea   . Diabetic retinopathy   . Chronic diastolic heart failure     Past Surgical History  Procedure Date  . Cardiac catheterization   . Incision and  drainage perirectal abscess 04/03/2012    Procedure: IRRIGATION AND DEBRIDEMENT PERIRECTAL ABSCESS;  Surgeon: Harl Bowie, MD;  Location: [facility];  Service: General;  Laterality: Left;    No family history on file.  Social History:  reports that she has never smoked. She does not have any smokeless tobacco history on file. She reports that she does not drink alcohol or use illicit drugs.  Allergies:  Allergies  Allergen Reactions  . Lorazepam Anaphylaxis  . Orange Fruit (Citrus) Anaphylaxis    Pt stated throat swelling, itching    Medications:    Results for orders placed during the hospital encounter of 04/18/12 (from the past 48 hour(s))  CBC WITH DIFFERENTIAL     Status: Abnormal   Collection Time   04/18/12  1:07 PM      Component Value Range Comment   WBC 10.0  4.0 - 10.5 K/uL    RBC 3.29 (*) 3.87 - 5.11 MIL/uL    Hemoglobin 8.5 (*) 12.0 - 15.0 g/dL    HCT 26.1 (*) 36.0 - 46.0 %    MCV 79.3  78.0 - 100.0 fL    MCH 25.8 (*) 26.0 - 34.0 pg    MCHC 32.6  30.0 - 36.0 g/dL    RDW 17.2 (*) 11.5 - 15.5 %    Platelets 488 (*) 150 - 400 K/uL    Neutrophils Relative 76  43 - 77 %    Neutro Abs 7.7  1.7 -  7.7 K/uL    Lymphocytes Relative 18  12 - 46 %    Lymphs Abs 1.8  0.7 - 4.0 K/uL    Monocytes Relative 5  3 - 12 %    Monocytes Absolute 0.5  0.1 - 1.0 K/uL    Eosinophils Relative 0  0 - 5 %    Eosinophils Absolute 0.0  0.0 - 0.7 K/uL    Basophils Relative 0  0 - 1 %    Basophils Absolute 0.0  0.0 - 0.1 K/uL   BASIC METABOLIC PANEL     Status: Abnormal   Collection Time   04/18/12  1:07 PM      Component Value Range Comment   Sodium 130 (*) 135 - 145 mEq/L    Potassium 3.9  3.5 - 5.1 mEq/L    Chloride 93 (*) 96 - 112 mEq/L    CO2 25  19 - 32 mEq/L    Glucose, Bld 462 (*) 70 - 99 mg/dL    BUN 22  6 - 23 mg/dL    Creatinine, Ser 1.51 (*) 0.50 - 1.10 mg/dL    Calcium 8.9  8.4 - 10.5 mg/dL    GFR calc non Af Amer 37 (*) >90 mL/min    GFR calc Af Amer 43 (*) >90 mL/min    TROPONIN I     Status: Normal   Collection Time   04/18/12  1:07 PM      Component Value Range Comment   Troponin I <0.30  <0.30 ng/mL     Ct Angio Chest Pe W/cm &/or Wo Cm  04/18/2012  *RADIOLOGY REPORT*  Clinical Data: 56 year old female pleuritic chest pain.  Recent hospitalization for pelvic abscess.  Left shoulder pain.  CT ANGIOGRAPHY CHEST  Technique:  Multidetector CT imaging of the chest using the standard protocol during bolus administration of intravenous contrast. Multiplanar reconstructed images including MIPs were obtained and reviewed to evaluate the vascular anatomy.  Contrast: 192mL OMNIPAQUE IOHEXOL 350 MG/ML SOLN  Comparison: Chest CTA 05/15/2009.  Findings: Good contrast bolus timing in the pulmonary arterial tree.  Intermittent respiratory motion artifact, especially the lower lobes. No focal filling defect identified in the pulmonary arterial tree to suggest the presence of acute pulmonary embolism.  Atelectatic changes to the major airways which otherwise are patent.  Mild atelectasis in both costophrenic angles.  No consolidation.  Cardiomegaly.  No pericardial or pleural effusion.  Stable visualized aorta with mild atherosclerosis.  Negative visualized upper abdominal viscera.  No mediastinal lymphadenopathy.  Coronary artery atherosclerosis.  Negative thoracic inlet.  Degenerative changes in the spine. No acute osseous abnormality identified.  IMPRESSION: 1. No evidence of acute pulmonary embolus. 2.  Mild pulmonary atelectasis. 3.  Cardiomegaly and atherosclerosis.   Original Report Authenticated By: Randall An, M.D.     Review of Systems  Constitutional: Positive for diaphoresis. Negative for fever.  HENT: Positive for neck pain (Left side). Negative for congestion and sore throat.   Eyes: Negative for blurred vision and double vision.  Respiratory: Positive for shortness of breath. Negative for cough and wheezing.   Cardiovascular: Negative for chest pain,  palpitations, orthopnea, leg swelling and PND.  Gastrointestinal: Positive for nausea and abdominal pain ([place]). Negative for vomiting, diarrhea, constipation, blood in stool and melena.  Genitourinary: Negative for dysuria and hematuria.  Neurological: Positive for tingling (Left arm) and headaches.   Blood pressure 128/70, pulse 89, temperature 98.5 F (36.9 C), temperature source Oral, resp. rate 14, SpO2 96.00%. Physical  Exam  Constitutional: She is oriented to person, place, and time. No distress.       Morbidly obese, deconditioned female  HENT:  Head: Normocephalic and atraumatic.  Mouth/Throat: No oropharyngeal exudate.  Eyes: EOM are normal. Pupils are equal, round, and reactive to light. No scleral icterus.  Neck: Normal range of motion. Neck supple.  Cardiovascular: Normal rate, regular rhythm, S1 normal and S2 normal.   No murmur heard. Pulses:      Radial pulses are 2+ on the right side, and 2+ on the left side.       Dorsalis pedis pulses are 2+ on the right side, and 2+ on the left side.       No carotid bruits  Respiratory: Effort normal and breath sounds normal. She has no wheezes. She has no rales.  GI: Soft. Bowel sounds are normal. There is Tenderness: Left lower quad..  Musculoskeletal: She exhibits no edema.       No LEE   Lymphadenopathy:    She has no cervical adenopathy.  Neurological: She is alert and oriented to person, place, and time. She exhibits normal muscle tone.  Skin: Skin is warm and dry.  Psychiatric: She has a normal mood and affect.    Assessment/Plan: Patient Active Hospital Problem List: SHOULDER PAIN, LEFT (10/20/2007) DIABETES MELLITUS, TYPE II, ON INSULIN, UNCONTROLLED (10/20/2007) Proliferative diabetic retinopathy (10/07/2007) CAD, cath 2010, turned down for CABG "poor targets" (02/27/2009) Obesity, morbid (01/17/2012) Obstructive sleep apnea () Hyponatremia (04/18/2012) Hyperglycemia (04/18/2012) ARI  Plan:  CT angio negative for  PE.  EKG shows no acute changes.  Troponin negative.  Her shoulder pain is reproducible anteriorly and medially to the left scapulae.  I do not think this is related to her heart.  She is taking all her meds which was not the case back in June.  She reports not getting her insulin this morning prior to coming to the ER.  She does have ARI.  SCr is elevated at 1.51 compared to prior level of 0.9.  She is anemic but hgb is stable.  May need some IV NS.  Left shoulder xray?  Meriam Chojnowski 04/18/2012, 4:18 PM

## 2012-04-19 ENCOUNTER — Ambulatory Visit (HOSPITAL_BASED_OUTPATIENT_CLINIC_OR_DEPARTMENT_OTHER): Payer: Medicare Other | Attending: Pulmonary Disease | Admitting: Radiology

## 2012-04-19 VITALS — Ht 63.0 in | Wt 258.0 lb

## 2012-04-19 DIAGNOSIS — G4733 Obstructive sleep apnea (adult) (pediatric): Secondary | ICD-10-CM

## 2012-04-19 NOTE — ED Provider Notes (Signed)
I saw and evaluated the patient, reviewed the resident's note and I agree with the findings and plan. Please see separate ED provider note.    Delora Fuel, MD [id number]

## 2012-04-28 DIAGNOSIS — G4730 Sleep apnea, unspecified: Secondary | ICD-10-CM

## 2012-04-28 DIAGNOSIS — G4710 Hypersomnia, unspecified: Secondary | ICD-10-CM

## 2012-04-28 NOTE — Procedures (Signed)
NAMEJINEEN, [name] NO.:  [id number]  MEDICAL RECORD NO.:  [id number]          PATIENT TYPE:  OUT  LOCATION:  SLEEP CENTER                 FACILITY[facility]  [facility]  PHYSICIAN:  Kathee Delton, MD,FCCPDATE OF BIRTH:  1955-11-[birth year]  DATE OF STUDY:  04/19/2012                           NOCTURNAL POLYSOMNOGRAM  REFERRING PHYSICIAN:  DOUGLAS Roselie Awkward, MD  REFERRING PHYSICIAN:  Norlene Campbell, MD  INDICATION FOR STUDY:  Hypersomnia with sleep apnea.  EPWORTH SLEEPINESS SCORE:  2.  SLEEP ARCHITECTURE:  The patient had a total sleep time of 371 minutes with no slow-wave sleep and decreased quantity of REM.  Sleep onset latency was normal at 0.5 minutes, and REM onset was normal at 82 minutes.  Sleep efficiency was excellent during the night.  RESPIRATORY DATA:  The patient underwent a split night protocol where she was found to have 35 obstructive events from the first 136 minutes of sleep.  This gave her an apnea/hypopnea index during the diagnostic portion of the study of 16 events per hour.  The events occurred in all body positions and there was moderate snoring noted throughout.  By protocol, the patient met split night criteria, and was fitted with a medium ResMed Quattro full-face mask.  CPAP titration was initiated, and she was found to have elimination of her obstructive events with a pressure of 7 cm of water.  The pressure was increased further to 11 cm in order to eliminate snoring.  OXYGEN DATA:  There was O2 desaturation as low as 80% with the patient's obstructive event.  CARDIAC DATA:  Occasional PVCs noted, but no clinically significant arrhythmias were seen.  MOVEMENT/PARASOMNIA:  The patient had no significant leg jerks or other abnormal behaviors noted.  IMPRESSION/RECOMMENDATION: 1. Mild obstructive sleep apnea/hypopnea syndrome, with an AHI of 16     events per hour and oxygen desaturation as low as 80%.  Treatment     for this  degree of sleep apnea can include a trial of weight loss     alone, upper airway surgery, dental appliance, and also CPAP.  If     CPAP is chosen as her treatment modality, will need a medium ResMed     Quattro full face mask, and treatment pressures between 7 and 11 cm     of water.  Clinical correlation is     suggested 2. Occasional premature ventricular contraction noted, but no     clinically significant arrhythmias were seen.     Kathee Delton, MD,FCCP Diplomate, [place] Board of Sleep Medicine    [facility]/MEDQ  D:  04/28/2012 07:55:25  T:  04/28/2012 22:07:47  Job:  [id number]

## 2012-05-02 ENCOUNTER — Telehealth: Payer: Self-pay | Admitting: *Deleted

## 2012-05-02 ENCOUNTER — Encounter: Payer: Self-pay | Admitting: Pulmonary Disease

## 2012-05-02 NOTE — Telephone Encounter (Signed)
Message copied by Rosana Berger on Tue May 02, 2012  1:54 PM ------      Message from: Norlene Campbell B      Created: Tue May 02, 2012  1:29 PM       L,            Can you call her to make sure her CPAP at home has been titrated up to 11cm H20 from 7cm?  She had a recent split night with Guys.            Thanks,      B

## 2012-05-02 NOTE — Telephone Encounter (Signed)
Called and spoke with the pt and notified of results per Dr. Lake Bells.  Pt verbalized understanding and denied any questions.

## 2012-05-03 ENCOUNTER — Encounter (INDEPENDENT_AMBULATORY_CARE_PROVIDER_SITE_OTHER): Payer: Medicare Other | Admitting: Surgery

## 2012-05-05 ENCOUNTER — Encounter: Payer: Self-pay | Admitting: Gastroenterology

## 2012-05-16 ENCOUNTER — Encounter (INDEPENDENT_AMBULATORY_CARE_PROVIDER_SITE_OTHER): Payer: Medicare Other | Admitting: Surgery

## 2012-05-25 ENCOUNTER — Ambulatory Visit (INDEPENDENT_AMBULATORY_CARE_PROVIDER_SITE_OTHER): Payer: Medicare Other | Admitting: Gastroenterology

## 2012-05-25 ENCOUNTER — Encounter: Payer: Self-pay | Admitting: Gastroenterology

## 2012-05-25 VITALS — BP 142/68 | HR 108 | Ht 63.75 in | Wt 261.1 lb

## 2012-05-25 DIAGNOSIS — R112 Nausea with vomiting, unspecified: Secondary | ICD-10-CM

## 2012-05-25 DIAGNOSIS — R109 Unspecified abdominal pain: Secondary | ICD-10-CM

## 2012-05-25 DIAGNOSIS — K5900 Constipation, unspecified: Secondary | ICD-10-CM

## 2012-05-25 DIAGNOSIS — R195 Other fecal abnormalities: Secondary | ICD-10-CM

## 2012-05-25 NOTE — Progress Notes (Signed)
History of Present Illness: This is a 56 year old female accompanied by her husband. He was found to have Hemoccult positive stool in early September on hospital admission for a perirectal abscess. She eventually underwent I&D. She was discharged to make [facility] and stayed for approximately 5 weeks and apparently signed out AMA on Friday the 18th. She has had ongoing problems with constipation for several months. She was previously treated with MiraLax which was effective most recently she takes a due to lax as needed which also has been effective. She notes lower, pain when she is constipated. He had difficulties with reflux and intermittent nausea and vomiting in September however the nausea and vomiting resolved several weeks ago and has not returned. Her diabetes mellitus was not well controlled but has now come under better control. She was referred by [facility] for evaluation but unfortunately we did not receive any records [facility]. She is not previously had colonoscopy or upper endoscopy. She has a history of iron deficiency anemia. Denies weight loss, diarrhea, change in stool caliber, melena, hematochezia, dysphagia, chest pain.  Allergies  Allergen Reactions  . Lorazepam Anaphylaxis  . Orange Fruit (Citrus) Anaphylaxis    Pt stated throat swelling, itching   Outpatient Prescriptions Prior to Visit  Medication Sig Dispense Refill  . acetaminophen (TYLENOL) 500 MG tablet Take 500 mg by mouth every 6 (six) hours as needed. For pain      . aspirin 81 MG tablet Take 81 mg by mouth daily.      Marland Kitchen atorvastatin (LIPITOR) 20 MG tablet Take 1 tablet (20 mg total) by mouth daily at 6 PM.      . atropine 1 % ophthalmic solution Place 1 drop into the right eye 2 (two) times daily.      . bisacodyl (DULCOLAX) 5 MG EC tablet Take 5 mg by mouth daily as needed. For constipation      . Blood Glucose Monitoring Suppl W/DEVICE KIT 1 each by Does not apply route 3 (three) times daily.   1 each  0  . diltiazem (DILACOR XR) 240 MG 24 hr capsule Take 240 mg by mouth daily.      . furosemide (LASIX) 40 MG tablet Take 40 mg by mouth 2 (two) times daily.       . hydrALAZINE (APRESOLINE) 50 MG tablet Take 50 mg by mouth 2 (two) times daily.      Marland Kitchen ibuprofen (ADVIL,MOTRIN) 800 MG tablet Take 1 tablet (800 mg total) by mouth every 8 (eight) hours as needed for pain.  21 tablet  0  . insulin lispro protamine-insulin lispro (HUMALOG 75/25) (75-25) 100 UNIT/ML SUSP Inject 65 Units into the skin 2 (two) times daily with a meal.       . isosorbide mononitrate (IMDUR) 60 MG 24 hr tablet Take 60 mg by mouth daily.      Marland Kitchen losartan-hydrochlorothiazide (HYZAAR) 100-25 MG per tablet Take 1 tablet by mouth daily.      . medroxyPROGESTERone (PROVERA) 10 MG tablet Take 10 mg by mouth daily as needed. Takes only when bleeding      . metoprolol (TOPROL-XL) 200 MG 24 hr tablet Take 200 mg by mouth daily.      . nitroGLYCERIN (NITROSTAT) 0.4 MG SL tablet Place 0.4 mg under the tongue every 5 (five) minutes as needed. For chest pain      . pantoprazole (PROTONIX) 40 MG tablet Take 40 mg by mouth daily.      . polyvinyl  alcohol (LIQUIFILM TEARS) 1.4 % ophthalmic solution Place 1 drop into both eyes daily as needed. For dry eyes      . potassium chloride (K-DUR,KLOR-CON) 10 MEQ tablet Take 10 mEq by mouth daily.      . ranolazine (RANEXA) 500 MG 12 hr tablet Take 500 mg by mouth 2 (two) times daily.       Past Medical History  Diagnosis Date  . Diabetes mellitus   . Hypertension   . Hyperlipemia   . Coronary artery disease   . Headache   . Chest pain at rest 01/15/2012  . Nausea and vomiting, secondary to viral syndrome 01/17/2012  . Anemia   . Obstructive sleep apnea   . Diabetic retinopathy   . Chronic diastolic heart failure    Past Surgical History  Procedure Date  . Cardiac catheterization   . Incision and drainage perirectal abscess 04/03/2012    Procedure: IRRIGATION AND DEBRIDEMENT  PERIRECTAL ABSCESS;  Surgeon: Harl Bowie, MD;  Location: [place];  Service: General;  Laterality: Left;  . Groin abscess removed     left   History   Social History  . Marital Status: Married    Spouse Name: N/A    Number of Children: 0  . Years of Education: N/A   Social History Main Topics  . Smoking status: Never Smoker   . Smokeless tobacco: Never Used  . Alcohol Use: No  . Drug Use: No  . Sexually Active: None   Other Topics Concern  . None   Social History Narrative  . None   Family History  Problem Relation Age of Onset  . Colon polyps Father   . Diabetes Mother   . Diabetes Father   . Diabetes Brother   . Diabetes Sister   . Heart disease Father   . Heart disease Brother   . Heart disease Sister    Review of Systems: Pertinent positive and negative review of systems were noted in the above HPI section. All other review of systems were otherwise negative.   Physical Exam: General: Well developed , well nourished, obese, no acute distress Head: Normocephalic and atraumatic Eyes:  sclerae anicteric, EOMI, patch over right eye Ears: Normal auditory acuity Mouth: No deformity or lesions Neck: Supple, no masses or thyromegaly Lungs: Clear throughout to auscultation Heart: Regular rate and rhythm; no murmurs, rubs or bruits Abdomen: Soft, non tender and non distended. No masses, hepatosplenomegaly or hernias noted. Normal Bowel sounds Rectal: Deferred to colonoscopy Musculoskeletal: Symmetrical with no gross deformities  Skin: No lesions on visible extremities Pulses:  Normal pulses noted Extremities: No clubbing, cyanosis, edema or deformities noted Neurological: Alert oriented x 4, grossly nonfocal Cervical Nodes:  No significant cervical adenopathy Inguinal Nodes: No significant inguinal adenopathy Psychological:  Alert and cooperative. Normal mood and affect  Assessment and Recommendations:  1. Hemoccult positive stool, chronic constipation  associated with lower abdominal pain. Recent perirectal abscess. Suffice to keep her followup appointment with Dr. Ninfa Linden on 10/31 Rule out colorectal neoplasms. Schedule colonoscopy. Continue high fiber diet with adequate water intake. Milk of magnesia, MiraLax or Ducolax to manage constipation.  2. GERD. Continue standard antireflux measures and pantoprazole 40 mg daily.  3. Nausea and vomiting, resolved. May have been related to reflux or poorly controlled diabetes mellitus. Her diabetes is under much better control at this time.  4. Insulin-dependent diabetes mellitus.

## 2012-05-25 NOTE — Patient Instructions (Addendum)
You have been scheduled for a colonoscopy with propofol. Please follow written instructions given to you at your visit today.  Please pick up your prep kit at the pharmacy within the next 1-3 days. If you use inhalers (even only as needed), please bring them with you on the day of your procedure.  

## 2012-06-01 ENCOUNTER — Encounter (INDEPENDENT_AMBULATORY_CARE_PROVIDER_SITE_OTHER): Payer: Self-pay | Admitting: Surgery

## 2012-06-01 ENCOUNTER — Telehealth: Payer: Self-pay | Admitting: Gastroenterology

## 2012-06-01 ENCOUNTER — Ambulatory Visit (INDEPENDENT_AMBULATORY_CARE_PROVIDER_SITE_OTHER): Payer: Medicare Other | Admitting: Surgery

## 2012-06-01 VITALS — BP 116/82 | HR 88 | Temp 98.6°F | Resp 14 | Ht 63.0 in | Wt 255.6 lb

## 2012-06-01 DIAGNOSIS — Z09 Encounter for follow-up examination after completed treatment for conditions other than malignant neoplasm: Secondary | ICD-10-CM

## 2012-06-01 MED ORDER — PEG-KCL-NACL-NASULF-NA ASC-C 100 G PO SOLR: 1.0000 | Freq: Once | ORAL | Status: DC

## 2012-06-01 NOTE — Telephone Encounter (Signed)
Prescription sent patient's pharmacy.

## 2012-06-01 NOTE — Progress Notes (Signed)
Subjective:     Patient ID: [name], female   DOB: 07/28/1956, 56 y.o.   MRN: [id number]  HPI She is here for a postop visit status post incision, drainage, and debridement of a severe gluteal abscess with gangrene. Her husband is now doing daily dressing changes. She is doing well and has had no fevers  Review of Systems     Objective:   Physical Exam On exam, the wound is very clean with no evidence of infection. There is excellent granulation tissue    Assessment:     Patient stable postop    Plan:     She will now be hydrogel dressing changes daily. I will see her back in one month

## 2012-06-05 ENCOUNTER — Telehealth: Payer: Self-pay | Admitting: *Deleted

## 2012-06-05 IMAGING — CR DG CHEST 1V PORT
1 series · 1 of 1 positions shown · non-contrast
Comparison: Portable chest x-ray of 11/09/2009

CLINICAL DATA: Left chest pain, back pain

PORTABLE CHEST - 1 VIEW

[view not recorded]
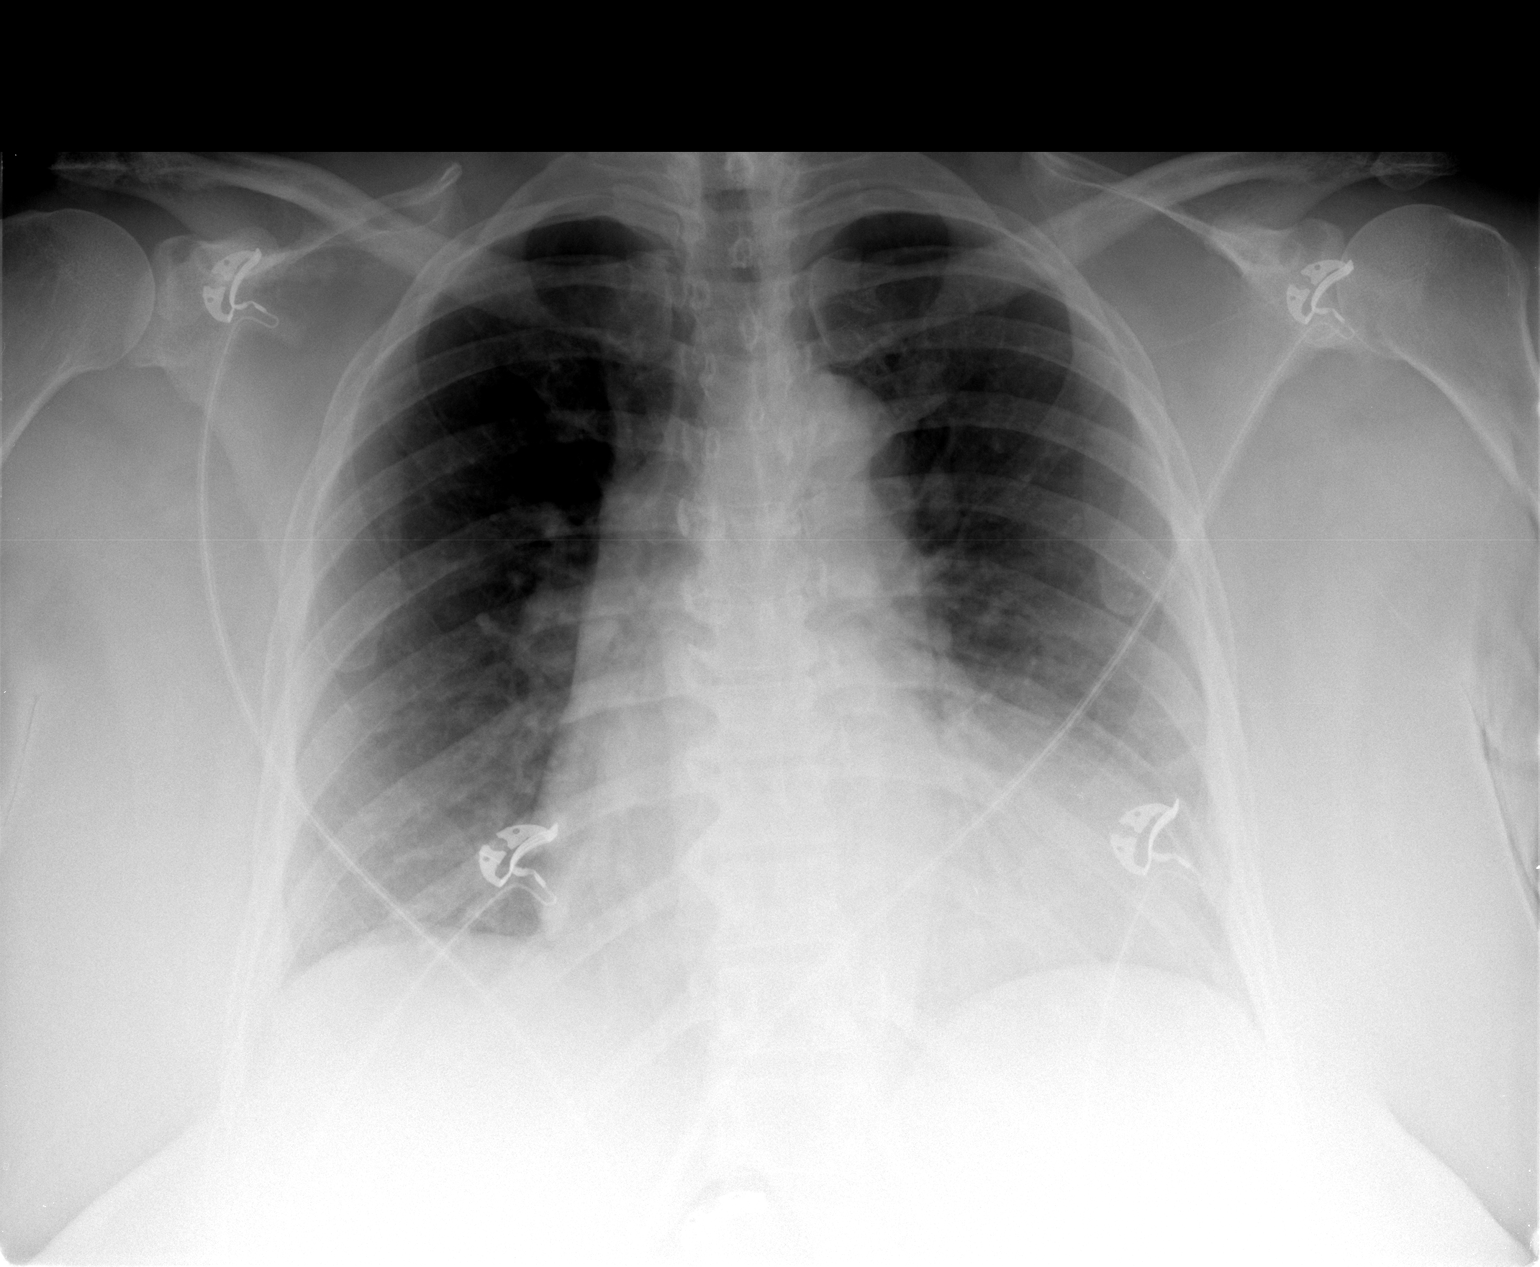

[1 of 1 positions shown; findings below may reference images not displayed]

FINDINGS: Mild cardiomegaly is stable.  No active infiltrate or
effusion is seen.  There are degenerative changes throughout the
thoracic spine.
IMPRESSION: Stable mild cardiomegaly.  No active lung disease.

## 2012-06-05 NOTE — Telephone Encounter (Signed)
Message copied by Rosana Berger on Mon Jun 05, 2012  4:43 PM ------      Message from: Simonne Maffucci B      Created: Mon Jun 05, 2012  9:16 AM       L,            Can we set up an ONO for her on CPAP.  Clance's CPAP titration showed desaturation even on the right CPAP dose (11cm H20), so we need to see if she needs bleed in O2.            Thanks,      B

## 2012-06-05 NOTE — Telephone Encounter (Signed)
ATC pt, NA and no option to leave a msg, [facility] 

## 2012-06-06 ENCOUNTER — Ambulatory Visit (HOSPITAL_COMMUNITY): Admission: RE | Admit: 2012-06-06 | Payer: Medicare Other | Source: Ambulatory Visit | Admitting: Gastroenterology

## 2012-06-06 ENCOUNTER — Encounter: Payer: Medicare Other | Admitting: Gastroenterology

## 2012-06-06 ENCOUNTER — Encounter (HOSPITAL_COMMUNITY): Admission: RE | Payer: Self-pay | Source: Ambulatory Visit

## 2012-06-06 SURGERY — COLONOSCOPY
Anesthesia: Moderate Sedation

## 2012-06-06 NOTE — Telephone Encounter (Signed)
ATC the pt again, NA and no option to leave a msg, [facility]

## 2012-06-09 ENCOUNTER — Encounter: Payer: Self-pay | Admitting: *Deleted

## 2012-06-09 NOTE — Telephone Encounter (Signed)
Called the pt, still unable to reach so mailed a letter to her.

## 2012-06-27 ENCOUNTER — Encounter (INDEPENDENT_AMBULATORY_CARE_PROVIDER_SITE_OTHER): Payer: Self-pay | Admitting: Surgery

## 2012-06-27 ENCOUNTER — Ambulatory Visit (INDEPENDENT_AMBULATORY_CARE_PROVIDER_SITE_OTHER): Payer: Medicare Other | Admitting: Surgery

## 2012-06-27 VITALS — BP 122/86 | HR 80 | Temp 97.4°F | Ht 63.0 in | Wt 266.4 lb

## 2012-06-27 DIAGNOSIS — Z09 Encounter for follow-up examination after completed treatment for conditions other than malignant neoplasm: Secondary | ICD-10-CM

## 2012-06-27 NOTE — Progress Notes (Signed)
Subjective:     Patient ID: [name], female   DOB: 03/04/1956, 56 y.o.   MRN: [id number]  HPI She is here for another wound check. She is still undergoing hydrogel dressing changes daily. She has no complaints  Review of Systems     Objective:   Physical Exam The wound continues to contract well. There is no evidence of ongoing infection    Assessment:     Patient stable postop    Plan:     They are going to stop the hydrogel dressing changes and just cover with a dry dressing. I will see her back in approximately 3 weeks.

## 2012-07-18 ENCOUNTER — Ambulatory Visit (INDEPENDENT_AMBULATORY_CARE_PROVIDER_SITE_OTHER): Payer: Medicare Other | Admitting: Surgery

## 2012-07-18 ENCOUNTER — Encounter (INDEPENDENT_AMBULATORY_CARE_PROVIDER_SITE_OTHER): Payer: Self-pay | Admitting: Surgery

## 2012-07-18 VITALS — BP 142/64 | HR 92 | Temp 97.4°F | Resp 20 | Ht 63.0 in | Wt 270.6 lb

## 2012-07-18 DIAGNOSIS — T8189XA Other complications of procedures, not elsewhere classified, initial encounter: Secondary | ICD-10-CM

## 2012-07-18 NOTE — Progress Notes (Signed)
Subjective:     Patient ID: [name], female   DOB: [birth year], 56 y.o.   MRN: [id number]  HPI She is here for another wound check. When I saw her last we decided to dress the wound all and a dry dressing. The husband has been doing the dressing care and reports the wound is swollen slightly  Review of Systems     Objective:   Physical Exam On exam, the wound is clean. I treated the granulation tissue with silver nitrate    Assessment:     Nonhealing surgical wound    Plan:     We will resume the hydrogel dressing changes and I will see her back in one month.

## 2012-08-15 ENCOUNTER — Encounter (INDEPENDENT_AMBULATORY_CARE_PROVIDER_SITE_OTHER): Payer: Self-pay | Admitting: Surgery

## 2012-08-15 ENCOUNTER — Ambulatory Visit (INDEPENDENT_AMBULATORY_CARE_PROVIDER_SITE_OTHER): Payer: Medicare Other | Admitting: Surgery

## 2012-08-15 VITALS — BP 152/82 | HR 76 | Temp 98.4°F | Resp 18 | Ht 63.0 in | Wt 272.0 lb

## 2012-08-15 DIAGNOSIS — T8189XA Other complications of procedures, not elsewhere classified, initial encounter: Secondary | ICD-10-CM

## 2012-08-15 NOTE — Progress Notes (Signed)
Subjective:     Patient ID: [name], female   DOB: August 28, 1955, 57 y.o.   MRN: [id number]  HPI She is here for another wound check. Her husband has been putting Neosporin on the wound as the hydrogel was not available. He reports no drainage from the wound and she has been pain-free  Review of Systems     Objective:   Physical Exam On exam, there continues to be excellent granulation tissue in the wound. There is no evidence of infection. It is approximately 1 cm deep and 2 cm long    Assessment:     Nonhealing surgical wound    Plan:     They will continue the current wound care and dressing changes. I will see her back in one month

## 2012-09-12 ENCOUNTER — Ambulatory Visit (INDEPENDENT_AMBULATORY_CARE_PROVIDER_SITE_OTHER): Payer: Medicare Other | Admitting: Surgery

## 2012-09-12 ENCOUNTER — Encounter (INDEPENDENT_AMBULATORY_CARE_PROVIDER_SITE_OTHER): Payer: Self-pay | Admitting: Surgery

## 2012-09-12 VITALS — BP 125/84 | HR 64 | Temp 97.8°F | Resp 16 | Ht 63.0 in | Wt 273.2 lb

## 2012-09-12 DIAGNOSIS — T8189XA Other complications of procedures, not elsewhere classified, initial encounter: Secondary | ICD-10-CM

## 2012-09-12 NOTE — Progress Notes (Signed)
Subjective:     Patient ID: [name], female   DOB: [birth year], 57 y.o.   MRN: [id number]  HPI  She is here for another visit regarding her left buttock wound.She is doing well and has no complaints Review of Systems     Objective:   Physical Exam On exam, the wound is now completely healed    Assessment:     Nonhealing surgical wound now resolved     Plan:     They may stop the wound care. I will see her back as needed

## 2012-11-13 ENCOUNTER — Other Ambulatory Visit (HOSPITAL_COMMUNITY): Payer: Self-pay | Admitting: Cardiology

## 2012-11-13 DIAGNOSIS — M7989 Other specified soft tissue disorders: Secondary | ICD-10-CM

## 2012-11-13 DIAGNOSIS — M79609 Pain in unspecified limb: Secondary | ICD-10-CM

## 2012-11-16 ENCOUNTER — Ambulatory Visit (HOSPITAL_COMMUNITY)
Admission: RE | Admit: 2012-11-16 | Discharge: 2012-11-16 | Disposition: A | Discharge status: Home / Self Care | Payer: Medicare Other | Source: Ambulatory Visit | Attending: Cardiology | Admitting: Cardiology

## 2012-11-16 DIAGNOSIS — M79609 Pain in unspecified limb: Secondary | ICD-10-CM | POA: Insufficient documentation

## 2012-11-16 DIAGNOSIS — M7989 Other specified soft tissue disorders: Secondary | ICD-10-CM | POA: Insufficient documentation

## 2012-11-16 NOTE — Progress Notes (Signed)
Venous Duplex Imaging - [facility] [employer]

## 2013-01-10 ENCOUNTER — Encounter (HOSPITAL_COMMUNITY): Payer: Self-pay | Admitting: *Deleted

## 2013-01-10 ENCOUNTER — Emergency Department (HOSPITAL_COMMUNITY)
Admission: EM | Admit: 2013-01-10 | Discharge: 2013-01-10 | Disposition: A | Discharge status: Home / Self Care | Payer: Medicare Other | Attending: Emergency Medicine | Admitting: Emergency Medicine

## 2013-01-10 DIAGNOSIS — I10 Essential (primary) hypertension: Secondary | ICD-10-CM | POA: Insufficient documentation

## 2013-01-10 DIAGNOSIS — Z7982 Long term (current) use of aspirin: Secondary | ICD-10-CM | POA: Insufficient documentation

## 2013-01-10 DIAGNOSIS — Z79899 Other long term (current) drug therapy: Secondary | ICD-10-CM | POA: Insufficient documentation

## 2013-01-10 DIAGNOSIS — G44209 Tension-type headache, unspecified, not intractable: Secondary | ICD-10-CM | POA: Insufficient documentation

## 2013-01-10 DIAGNOSIS — E11319 Type 2 diabetes mellitus with unspecified diabetic retinopathy without macular edema: Secondary | ICD-10-CM | POA: Insufficient documentation

## 2013-01-10 DIAGNOSIS — I5032 Chronic diastolic (congestive) heart failure: Secondary | ICD-10-CM | POA: Insufficient documentation

## 2013-01-10 DIAGNOSIS — E785 Hyperlipidemia, unspecified: Secondary | ICD-10-CM | POA: Insufficient documentation

## 2013-01-10 DIAGNOSIS — E1139 Type 2 diabetes mellitus with other diabetic ophthalmic complication: Secondary | ICD-10-CM | POA: Insufficient documentation

## 2013-01-10 DIAGNOSIS — Z794 Long term (current) use of insulin: Secondary | ICD-10-CM | POA: Insufficient documentation

## 2013-01-10 DIAGNOSIS — R110 Nausea: Secondary | ICD-10-CM | POA: Insufficient documentation

## 2013-01-10 DIAGNOSIS — D649 Anemia, unspecified: Secondary | ICD-10-CM | POA: Insufficient documentation

## 2013-01-10 DIAGNOSIS — I2510 Atherosclerotic heart disease of native coronary artery without angina pectoris: Secondary | ICD-10-CM | POA: Insufficient documentation

## 2013-01-10 DIAGNOSIS — Z8669 Personal history of other diseases of the nervous system and sense organs: Secondary | ICD-10-CM | POA: Insufficient documentation

## 2013-01-10 LAB — GLUCOSE, CAPILLARY: Glucose-Capillary: 261 mg/dL — ABNORMAL HIGH (ref 70–99)

## 2013-01-10 MED ORDER — ONDANSETRON 4 MG PO TBDP
8.0000 mg | ORAL_TABLET | Freq: Once | ORAL | Status: AC
  Administered 2013-01-10: 8 mg via ORAL
  Filled 2013-01-10: qty 2

## 2013-01-10 MED ORDER — ONDANSETRON 4 MG PO TBDP: 4.0000 mg | ORAL_TABLET | Freq: Three times a day (TID) | ORAL | Status: DC | PRN

## 2013-01-10 MED ORDER — IBUPROFEN 800 MG PO TABS
800.0000 mg | ORAL_TABLET | Freq: Once | ORAL | Status: AC
  Administered 2013-01-10: 800 mg via ORAL
  Filled 2013-01-10 (×2): qty 1

## 2013-01-10 NOTE — ED Notes (Signed)
POCT CBG resulted 261; Rose Fillers, RN notified

## 2013-01-10 NOTE — ED Provider Notes (Signed)
  Medical screening examination/treatment/procedure(s) were performed by non-physician practitioner and as supervising physician I was immediately available for consultation/collaboration.    Johnna Acosta, MD 01/10/13 [phone]

## 2013-01-10 NOTE — ED Notes (Signed)
Pt came in via ems and states headache that radiates into sinus area since 0200.  Pt is already visually impaired.  Pt complains of abdominal pain but battles constipation.  Pt follows commands and no other neuro deficits noted with headache symptoms.  Pt does complain of nausea

## 2013-01-10 NOTE — ED Provider Notes (Signed)
History     CSN: [id number]  Arrival date & time 01/10/13  60   First MD Initiated Contact with Patient 01/10/13 1236      Chief Complaint  Patient presents with  . 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 Patient states that headache began as a mild throbbing sensation behind her right eye before going to sleep last night. Patient states that she awoke at 2 AM for worsening headache which had become a bandlike sensation across her forehead. Patient took Tylenol with mild relief of symptoms. Patient denies thunderclap onset and states she has had headaches like this in the past, but they haven't been as severe. Patient admits to associated nausea without emesis as well as a tingling sensation in her hands. Patient denies fever, hearing loss, difficulty speaking, inability to swallow, chest pain, shortness of breath or extremity weakness.   PCP - Dr. Darnelle Maffucci  Patient is a 57 y.o. female presenting with headaches. The history is provided by the patient. No language interpreter was used.  Headache Associated symptoms: nausea   Associated symptoms: no fever, no hearing loss, no neck pain, no neck stiffness, no numbness and no vomiting     Past Medical History  Diagnosis Date  . Diabetes mellitus   . Hypertension   . Hyperlipemia   . Coronary artery disease   . Headache(784.0)   . Chest pain at rest 01/15/2012  . Nausea and vomiting, secondary to viral syndrome 01/17/2012  . Anemia   . Obstructive sleep apnea   . Diabetic retinopathy   . Chronic diastolic heart failure     Past Surgical History  Procedure Laterality Date  . Cardiac catheterization    . Incision and drainage perirectal abscess  04/03/2012    Procedure: IRRIGATION AND DEBRIDEMENT  PERIRECTAL ABSCESS;  Surgeon: Harl Bowie, MD;  Location: [place];  Service: General;  Laterality: Left;  . Groin abscess removed      left    Family History  Problem Relation Age of Onset  . Colon polyps Father   . Diabetes Mother   . Diabetes Father   . Diabetes Brother   . Diabetes Sister   . Heart disease Father   . Heart disease Brother   . Heart disease Sister     History  Substance Use Topics  . Smoking status: Never Smoker   . Smokeless tobacco: Never Used  . Alcohol Use: No    OB History   Grav Para Term Preterm Abortions TAB SAB Ect Mult Living                  Review of Systems  Constitutional: Negative for fever.  HENT: Negative for hearing loss, trouble swallowing, neck pain and neck stiffness.   Eyes:       Legally blind in R eye  Respiratory: Negative for shortness of breath.   Cardiovascular: Negative for chest pain.  Gastrointestinal: Positive for nausea. Negative for vomiting.  Neurological: Positive for headaches. Negative for speech difficulty, weakness and numbness.       +tingling in b/l hands  All other systems reviewed and are negative.    Allergies  Lorazepam and Orange fruit  Home Medications   Current Outpatient Rx  Name  Route  Sig  Dispense  Refill  . acetaminophen (TYLENOL) 500 MG  tablet   Oral   Take 500 mg by mouth every 6 (six) hours as needed. For pain         . aspirin 81 MG tablet   Oral   Take 81 mg by mouth daily.         . bisacodyl (DULCOLAX) 5 MG EC tablet   Oral   Take 5 mg by mouth daily as needed. For constipation         . Blood Glucose Monitoring Suppl W/DEVICE KIT   Does not apply   1 each by Does not apply route 3 (three) times daily.   1 each   0     Please supply patient with blood glucose monitor s ...   . brimonidine-timolol (COMBIGAN) 0.2-0.5 % ophthalmic solution   Both Eyes   Place 1 drop into both eyes every 12 (twelve) hours.         Marland Kitchen diltiazem (DILACOR XR) 240 MG 24 hr  capsule   Oral   Take 240 mg by mouth daily.         Marland Kitchen docusate sodium (COLACE) 50 MG capsule   Oral   Take 100 mg by mouth 2 (two) times daily as needed for constipation.         . ferrous sulfate 325 (65 FE) MG tablet   Oral   Take 325 mg by mouth daily with breakfast.         . furosemide (LASIX) 40 MG tablet   Oral   Take 40 mg by mouth 2 (two) times daily.          . hydrALAZINE (APRESOLINE) 50 MG tablet   Oral   Take 50 mg by mouth 2 (two) times daily.         Marland Kitchen ibuprofen (ADVIL,MOTRIN) 800 MG tablet   Oral   Take 1 tablet (800 mg total) by mouth every 8 (eight) hours as needed for pain.   21 tablet   0   . insulin lispro (HUMALOG) 100 UNIT/ML injection   Subcutaneous   Inject 65 Units into the skin 2 (two) times daily.         Marland Kitchen losartan-hydrochlorothiazide (HYZAAR) 100-25 MG per tablet   Oral   Take 1 tablet by mouth daily.         . metoprolol (TOPROL-XL) 200 MG 24 hr tablet   Oral   Take 200 mg by mouth daily.         . nitroGLYCERIN (NITROSTAT) 0.4 MG SL tablet   Sublingual   Place 0.4 mg under the tongue every 5 (five) minutes as needed. For chest pain         . pantoprazole (PROTONIX) 40 MG tablet   Oral   Take 40 mg by mouth daily.         . polyethylene glycol powder (GLYCOLAX/MIRALAX) powder   Oral   Take 17 g by mouth daily.         . polyvinyl alcohol (LIQUIFILM TEARS) 1.4 % ophthalmic solution   Both Eyes   Place 1 drop into both eyes daily as needed. For dry eyes         . potassium chloride (K-DUR,KLOR-CON) 10 MEQ tablet   Oral   Take 10 mEq by mouth daily.         . ranolazine (RANEXA) 500 MG 12 hr tablet   Oral   Take 500 mg by mouth 2 (two) times daily.         Marland Kitchen  RELION INSULIN SYR 1CC/30G 30G X 5/16" 1 ML MISC               . VYTORIN 10-40 MG per tablet               . ondansetron (ZOFRAN ODT) 4 MG disintegrating tablet   Oral   Take 1 tablet (4 mg total) by mouth every 8 (eight) hours as  needed for nausea.   10 tablet   0     BP 158/68  Pulse 67  Temp(Src) 98.5 F (36.9 C) (Oral)  Resp 18  SpO2 95%  Physical Exam  Nursing note and vitals reviewed. Constitutional: She is oriented to person, place, and time. She appears well-developed and well-nourished. No distress.  Morbidly obese, well-appearing, and pleasant. In no acute distress.  HENT:  Head: Normocephalic and atraumatic.  Mouth/Throat: Oropharynx is clear and moist. No oropharyngeal exudate.  Symmetric rise of the uvula with phonation  Eyes: Conjunctivae and EOM are normal. Right eye exhibits no discharge. Left eye exhibits no discharge. No scleral icterus.  Neck: Normal range of motion. Neck supple.  Cardiovascular: Normal rate, regular rhythm and intact distal pulses.   Pulmonary/Chest: Effort normal and breath sounds normal. No respiratory distress. She has no wheezes. She has no rales.  Abdominal: Soft. There is no tenderness. There is no rebound and no guarding.  Musculoskeletal: Normal range of motion.  Lymphadenopathy:    She has no cervical adenopathy.  Neurological: She is alert and oriented to person, place, and time.  Cranial nerves 3 through 12 grossly intact. No sensory or motor deficits appreciated. Patient is equal grip strength bilaterally with 5 strength against resistance in her upper and lower extremities. DTRs normal and symmetric. Patient moves extremities without ataxia. Answers questions appropriately and speaks in full goal oriented sentences.  Skin: Skin is warm and dry. No rash noted. She is not diaphoretic. No erythema. No pallor.  Psychiatric: She has a normal mood and affect. Her behavior is normal.    ED Course  Procedures (including critical care time)  Labs Reviewed  GLUCOSE, CAPILLARY - Abnormal; Notable for the following:    Glucose-Capillary 261 (*)    All other components within normal limits   No results found.   1. Tension headache     MDM  Patient is a  57 year old female with multiple comorbidities who presents for tension, bandlike headache in her forehead, gradually worsening since last evening. Headache without thunderclap onset. Patient took Tylenol with mild relief. Patient neurovascularly intact without focal neurologic deficits on physical exam. Given nature of symptoms and method of onset, low suspicion for [facility]. No neurologic deficits to suspect CVA. Do not believe further work up with imaging is warranted at this time. Patient is alert, pleasant, and speaking in full goal oriented sentences. Zofran and ibuprofen order for symptoms. Will continue to monitor.  Patient states that nausea has resolved with Zofran and headache is improving after receiving ibuprofen. Will fluid challenge and reassess.  Patient endorses continued improvement in headache symptoms and is tolerating by mouth fluids without return of nausea. Patient continues to be pleasant and well-appearing. She is hemodynamically stable and appropriate for discharge with primary care follow up; will provide resource guide as patient denies established relationship with PCP. Ibuprofen and Zofran recommended for symptoms. Indications for ED return discussed. Patient verbalizes comfort and understanding with this discharge plan with no unaddressed concerns.    Antonietta Breach, PA-C 01/10/13 1457

## 2013-01-10 NOTE — ED Notes (Signed)
Pt reports headache since 0200 this am. Took Tylenol at home with minimal relief. No hx of migraines. 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 States vision about the same as usual. Reports nausea, no vomiting. Pain 7/10.

## 2013-02-19 ENCOUNTER — Other Ambulatory Visit: Payer: Self-pay | Admitting: *Deleted

## 2013-02-19 MED ORDER — LOSARTAN POTASSIUM-HCTZ 100-25 MG PO TABS: 1.0000 | ORAL_TABLET | Freq: Every day | ORAL | Status: DC

## 2013-02-19 MED ORDER — EZETIMIBE-SIMVASTATIN 10-40 MG PO TABS: 1.0000 | ORAL_TABLET | Freq: Every day | ORAL | Status: DC

## 2013-02-19 MED ORDER — METOPROLOL SUCCINATE ER 200 MG PO TB24: 200.0000 mg | ORAL_TABLET | Freq: Every day | ORAL | Status: DC

## 2013-02-19 MED ORDER — POTASSIUM CHLORIDE CRYS ER 10 MEQ PO TBCR: 10.0000 meq | EXTENDED_RELEASE_TABLET | Freq: Every day | ORAL | Status: DC

## 2013-02-19 MED ORDER — DILTIAZEM HCL ER 240 MG PO CP24: 240.0000 mg | ORAL_CAPSULE | Freq: Every day | ORAL | Status: DC

## 2013-02-19 NOTE — Telephone Encounter (Signed)
Rx was sent to pharmacy electronically. 

## 2013-02-26 ENCOUNTER — Telehealth: Payer: Self-pay | Admitting: Cardiovascular Disease

## 2013-02-26 NOTE — Telephone Encounter (Signed)
Message forwarded to Dr. Sallyanne Kuster to review for refill.

## 2013-02-26 NOTE — Telephone Encounter (Signed)
[name] was a patient at [facility].  She now needs her Insulin and syringes refilled.  Please call [facility] on [address] and let the patient know that this has been done.  [name] needs is also in need of a new PCP---can we advise.

## 2013-02-27 ENCOUNTER — Telehealth: Payer: Self-pay | Admitting: *Deleted

## 2013-02-27 MED ORDER — INSULIN LISPRO 100 UNIT/ML ~~LOC~~ SOLN: 65.0000 [IU] | Freq: Two times a day (BID) | SUBCUTANEOUS | Status: DC

## 2013-02-27 NOTE — Telephone Encounter (Signed)
Per Dr. Loletha Grayer will refill her diabetic meds and supplies until she sees a new PCP.  Gave her the # to [place] [phone].

## 2013-02-27 NOTE — Telephone Encounter (Signed)
Please refill these for her for 3 months supply. Give her contact info for the new [place] [facility]

## 2013-02-27 NOTE — Telephone Encounter (Signed)
Insulin and syringes refilled [facility] [place] [address] Abbott Pao. Notified and given # to [place]

## 2013-03-05 ENCOUNTER — Telehealth: Payer: Self-pay | Admitting: Cardiovascular Disease

## 2013-03-05 NOTE — Telephone Encounter (Signed)
Returned call.  Went straight to VM x 4.  Unable to leave message r/t voicemail not set up yet.  Will try again later.

## 2013-03-05 NOTE — Telephone Encounter (Signed)
Returned call and spoke w/ [name].  Stated pt was getting Humalog 75/25 mix and what they picked up was for Humalog 100 Units.  Wanted to make sure this is what pt is supposed to be taking.  Informed [name] that RN will contact pharmacy to verify what pt was taking and also notify Dr. Sallyanne Kuster for further instructions.  Verbalized understanding and agreed w/ plan.  Phone busy at pharmacy x 3.  Will try again later.  Message forwarded to Dr. Sallyanne Kuster.

## 2013-03-05 NOTE — Telephone Encounter (Signed)
Would like to know if they received the correct insulin that Dr. Sallyanne Kuster prescribe because this is not what they have been taking and he does not want to give her this insulin until they are sure .Elizabeth KitchenPlease call    Thanks

## 2013-03-05 NOTE — Telephone Encounter (Signed)
Call to pharmacy.  Informed pt was on Humalog Mix 75/25: 60 Units BID by Dr. Tomma Lightning and was last filled on 6.15.14.  Stated pt did pick up script for Humalog vial already.    Message forwarded to Dr. Sallyanne Kuster.

## 2013-03-07 ENCOUNTER — Ambulatory Visit: Payer: Medicare Other | Attending: Family Medicine | Admitting: Family Medicine

## 2013-03-07 VITALS — BP 142/85 | HR 64 | Temp 98.9°F | Resp 18 | Ht 63.0 in | Wt 266.0 lb

## 2013-03-07 DIAGNOSIS — R7309 Other abnormal glucose: Secondary | ICD-10-CM

## 2013-03-07 DIAGNOSIS — I5032 Chronic diastolic (congestive) heart failure: Secondary | ICD-10-CM

## 2013-03-07 DIAGNOSIS — E11359 Type 2 diabetes mellitus with proliferative diabetic retinopathy without macular edema: Secondary | ICD-10-CM

## 2013-03-07 DIAGNOSIS — E1159 Type 2 diabetes mellitus with other circulatory complications: Secondary | ICD-10-CM

## 2013-03-07 DIAGNOSIS — D649 Anemia, unspecified: Secondary | ICD-10-CM

## 2013-03-07 DIAGNOSIS — R739 Hyperglycemia, unspecified: Secondary | ICD-10-CM

## 2013-03-07 DIAGNOSIS — E1139 Type 2 diabetes mellitus with other diabetic ophthalmic complication: Secondary | ICD-10-CM | POA: Insufficient documentation

## 2013-03-07 DIAGNOSIS — E11319 Type 2 diabetes mellitus with unspecified diabetic retinopathy without macular edema: Secondary | ICD-10-CM | POA: Insufficient documentation

## 2013-03-07 DIAGNOSIS — K219 Gastro-esophageal reflux disease without esophagitis: Secondary | ICD-10-CM

## 2013-03-07 DIAGNOSIS — E133599 Other specified diabetes mellitus with proliferative diabetic retinopathy without macular edema, unspecified eye: Secondary | ICD-10-CM

## 2013-03-07 DIAGNOSIS — Z8669 Personal history of other diseases of the nervous system and sense organs: Secondary | ICD-10-CM

## 2013-03-07 DIAGNOSIS — G4733 Obstructive sleep apnea (adult) (pediatric): Secondary | ICD-10-CM

## 2013-03-07 MED ORDER — FREESTYLE LANCETS MISC: Status: DC

## 2013-03-07 MED ORDER — GLUCOSE BLOOD VI STRP: ORAL_STRIP | Status: DC

## 2013-03-07 MED ORDER — FREESTYLE LITE DEVI: 1.0000 | Status: DC

## 2013-03-07 MED ORDER — INSULIN LISPRO PROT & LISPRO (75-25 MIX) 100 UNIT/ML ~~LOC~~ SUSP: 65.0000 [IU] | Freq: Two times a day (BID) | SUBCUTANEOUS | Status: DC

## 2013-03-07 NOTE — Patient Instructions (Addendum)
Blood Sugar Monitoring, Adult  GLUCOSE METERS FOR SELF-MONITORING OF BLOOD GLUCOSE   It is important to be able to correctly measure your blood sugar (glucose). You can use a blood glucose monitor (a small battery-operated device) to check your glucose level at any time. This allows you and your caregiver to monitor your diabetes and to determine how well your treatment plan is working. The process of monitoring your blood glucose with a glucose meter is called self-monitoring of blood glucose (SMBG). When people with diabetes control their blood sugar, they have better health.  To test for glucose with a typical glucose meter, place the disposable strip in the meter. Then place a small sample of blood on the "test strip." The test strip is coated with chemicals that combine with glucose in blood. The meter measures how much glucose is present. The meter displays the glucose level as a number. Several new models can record and store a number of test results. Some models can connect to personal computers to store test results or print them out.   Newer meters are often easier to use than older models. Some meters allow you to get blood from places other than your fingertip. Some new models have automatic timing, error codes, signals, or barcode readers to help with proper adjustment (calibration). Some meters have a large display screen or spoken instructions for people with visual impairments.   INSTRUCTIONS FOR USING GLUCOSE METERS   Wash your hands with soap and warm water, or clean the area with alcohol. Dry your hands completely.   Prick the side of your fingertip with a lancet (a sharp-pointed tool used by hand).   Hold the hand down and gently milk the finger until a small drop of blood appears. Catch the blood with the test strip.    Follow the instructions for inserting the test strip and using the SMBG meter. Most meters require the meter to be turned on and the test strip to be inserted before applying the blood sample.   Record the test result.   Read the instructions carefully for both the meter and the test strips that go with it. Meter instructions are found in the user manual. Keep this manual to help you solve any problems that may arise. Many meters use "error codes" when there is a problem with the meter, the test strip, or the blood sample on the strip. You will need the manual to understand these error codes and fix the problem.   New devices are available such as laser lancets and meters that can test blood taken from "alternative sites" of the body, other than fingertips. However, you should use standard fingertip testing if your glucose changes rapidly. Also, use standard testing if:   You have eaten, exercised, or taken insulin in the past 2 hours.   You think your glucose is low.   You tend to not feel symptoms of low blood glucose (hypoglycemia).   You are ill or under stress.   Clean the meter as directed by the manufacturer.   Test the meter for accuracy as directed by the manufacturer.   Take your meter with you to your caregiver's office. This way, you can test your glucose in front of your caregiver to make sure you are using the meter correctly. Your caregiver can also take a sample of blood to test using a routine lab method. If values on the glucose meter are close to the lab results, you and your caregiver will see   that your meter is working well and you are using good technique. Your caregiver will advise you about what to do if the results do not match.  FREQUENCY OF TESTING    Your caregiver will tell you how often you should check your blood glucose. This will depend on your type of diabetes, your current level of diabetes control, and your types of medicines. The following are general guidelines, but your care plan may be different. Record all your readings and the time of day you took them for review with your caregiver.    Diabetes type 1.   When you are using insulin with good diabetic control (either multiple daily injections or via a pump), you should check your glucose 4 times a day.   If your diabetes is not well controlled, you may need to monitor more frequently, including before meals and 2 hours after meals, at bedtime, and occasionally between 2 a.m. and 3 a.m.   You should always check your glucose before a dose of insulin or before changing the rate on your insulin pump.   Diabetes type 2.   Guidelines for SMBG in diabetes type 2 are not as well defined.   If you are on insulin, follow the guidelines above.   If you are on medicines, but not insulin, and your glucose is not well controlled, you should test at least twice daily.   If you are not on insulin, and your diabetes is controlled with medicines or diet alone, you should test at least once daily, usually before breakfast.   A weekly profile will help your caregiver advise you on your care plan. The week before your visit, check your glucose before a meal and 2 hours after a meal at least daily. You may want to test before and after a different meal each day so you and your caregiver can tell how well controlled your blood sugars are throughout the course of a 24 hour period.   Gestational diabetes (diabetes during pregnancy).   Frequent testing is often necessary. Accurate timing is important.   If you are not on insulin, check your glucose 4 times a day. Check it before breakfast and 1 hour after the start of each meal.    If you are on insulin, check your glucose 6 times a day. Check it before each meal and 1 hour after the first bite of each meal.   General guidelines.   More frequent testing is required at the start of insulin treatment. Your caregiver will instruct you.   Test your glucose any time you suspect you have low blood sugar (hypoglycemia).   You should test more often when you change medicines, when you have unusual stress or illness, or in other unusual circumstances.  OTHER THINGS TO KNOW ABOUT GLUCOSE METERS   Measurement Range. Most glucose meters are able to read glucose levels over a broad range of values from as low as 0 to as high as 600 mg/dL. If you get an extremely high or low reading from your meter, you should first confirm it with another reading. Report very high or very low readings to your caregiver.   Whole Blood Glucose versus Plasma Glucose. Some older home glucose meters measure glucose in your whole blood. In a lab or when using some newer home glucose meters, the glucose is measured in your plasma (one component of blood). The difference can be important. It is important for you and your caregiver to know whether your meter   gives its results as "whole blood equivalent" or "plasma equivalent."   Display of High and Low Glucose Values. Part of learning how to operate a meter is understanding what the meter results mean. Know how high and low glucose concentrations are displayed on your meter.   Factors that Affect Glucose Meter Performance. The accuracy of your test results depends on many factors and varies depending on the brand and type of meter. These factors include:   Low red blood cell count (anemia).   Substances in your blood (such as uric acid, vitamin C, and others).   Environmental factors (temperature, humidity, altitude).   Name-brand versus generic test strips.    Calibration. Make sure your meter is set up properly. It is a good idea to do a calibration test with a control solution recommended by the manufacturer of your meter whenever you begin using a fresh bottle of test strips. This will help verify the accuracy of your meter.   Improperly stored, expired, or defective test strips. Keep your strips in a dry place with the lid on.   Soiled meter.   Inadequate blood sample.  NEW TECHNOLOGIES FOR GLUCOSE TESTING  Alternative site testing  Some glucose meters allow testing blood from alternative sites. These include the:   Upper arm.   Forearm.   Base of the thumb.   Thigh.  Sampling blood from alternative sites may be desirable. However, it may have some limitations. Blood in the fingertips show changes in glucose levels more quickly than blood in other parts of the body. This means that alternative site test results may be different from fingertip test results, not because of the meter's ability to test accurately, but because the actual glucose concentration can be different.   Continuous Glucose Monitoring  Devices to measure your blood glucose continuously are available, and others are in development. These methods can be more expensive than self-monitoring with a glucose meter. However, it is uncertain how effective and reliable these devices are. Your caregiver will advise you if this approach makes sense for you.  IF BLOOD SUGARS ARE CONTROLLED, PEOPLE WITH DIABETES REMAIN HEALTHIER.   SMBG is an important part of the treatment plan of patients with diabetes mellitus. Below are reasons for using SMBG:    It confirms that your glucose is at a specific, healthy level.   It detects hypoglycemia and severe hyperglycemia.   It allows you and your caregiver to make adjustments in response to changes in lifestyle for individuals requiring medicine.    It determines the need for starting insulin therapy in temporary diabetes that happens during pregnancy (gestational diabetes).  Document Released: 07/22/2003 Document Revised: 10/11/2011 Document Reviewed: 11/12/2010  [employer] Patient Information 2014 [employer].

## 2013-03-07 NOTE — Progress Notes (Signed)
Patient ID: [name], female   DOB: 02-Mar-1956, 57 y.o.   MRN: [id number]  CC: Establish Care   HPI: Pt says that she has not seen PCP in over 1 year.  She is being treated for type 2 DM with multiple complications.  Pt is blind in right eye.  She has cAD and has been followed by a cardiologist.  She has severe diabetic retinopathy and has been followed by opthalmology.  They are trying to save vision in left eye.  Pt says she is not testing BS at this time and needs a new glucose meter.   Allergies  Allergen Reactions  . Lorazepam Anaphylaxis  . Orange Fruit (Citrus) Anaphylaxis    Pt stated throat swelling, itching   Past Medical History  Diagnosis Date  . Diabetes mellitus   . Hypertension   . Hyperlipemia   . Coronary artery disease   . Headache(784.0)   . Chest pain at rest 01/15/2012  . Nausea and vomiting, secondary to viral syndrome 01/17/2012  . Anemia   . Obstructive sleep apnea   . Diabetic retinopathy   . Chronic diastolic heart failure    Current Outpatient Prescriptions on File Prior to Visit  Medication Sig Dispense Refill  . acetaminophen (TYLENOL) 500 MG tablet Take 500 mg by mouth every 6 (six) hours as needed. For pain      . aspirin 81 MG tablet Take 81 mg by mouth daily.      . bisacodyl (DULCOLAX) 5 MG EC tablet Take 5 mg by mouth daily as needed. For constipation      . Blood Glucose Monitoring Suppl W/DEVICE KIT 1 each by Does not apply route 3 (three) times daily.  1 each  0  . brimonidine-timolol (COMBIGAN) 0.2-0.5 % ophthalmic solution Place 1 drop into both eyes every 12 (twelve) hours.      Marland Kitchen diltiazem (DILACOR XR) 240 MG 24 hr capsule Take 1 capsule (240 mg total) by mouth daily.  90 capsule  3  . docusate sodium (COLACE) 50 MG capsule Take 100 mg by mouth 2 (two) times daily as needed for constipation.      Marland Kitchen ezetimibe-simvastatin (VYTORIN) 10-40 MG per tablet Take 1 tablet by mouth daily.  90 tablet  3  . ferrous sulfate 325 (65 FE) MG tablet  Take 325 mg by mouth daily with breakfast.      . furosemide (LASIX) 40 MG tablet Take 40 mg by mouth 2 (two) times daily.       . hydrALAZINE (APRESOLINE) 50 MG tablet Take 50 mg by mouth 2 (two) times daily.      Marland Kitchen ibuprofen (ADVIL,MOTRIN) 800 MG tablet Take 1 tablet (800 mg total) by mouth every 8 (eight) hours as needed for pain.  21 tablet  0  . insulin lispro (HUMALOG) 100 UNIT/ML injection Inject 65 Units into the skin 2 (two) times daily.  10 mL  3  . losartan-hydrochlorothiazide (HYZAAR) 100-25 MG per tablet Take 1 tablet by mouth daily.  90 tablet  3  . metoprolol (TOPROL-XL) 200 MG 24 hr tablet Take 1 tablet (200 mg total) by mouth daily.  90 tablet  3  . nitroGLYCERIN (NITROSTAT) 0.4 MG SL tablet Place 0.4 mg under the tongue every 5 (five) minutes as needed. For chest pain      . pantoprazole (PROTONIX) 40 MG tablet Take 40 mg by mouth daily.      . polyethylene glycol powder (GLYCOLAX/MIRALAX) powder Take 17 g by  mouth daily.      . polyvinyl alcohol (LIQUIFILM TEARS) 1.4 % ophthalmic solution Place 1 drop into both eyes daily as needed. For dry eyes      . potassium chloride (K-DUR,KLOR-CON) 10 MEQ tablet Take 1 tablet (10 mEq total) by mouth daily.  90 tablet  3  . ranolazine (RANEXA) 500 MG 12 hr tablet Take 500 mg by mouth 2 (two) times daily.      Marland Kitchen RELION INSULIN SYR 1CC/30G 30G X 5/16" 1 ML MISC       . ondansetron (ZOFRAN ODT) 4 MG disintegrating tablet Take 1 tablet (4 mg total) by mouth every 8 (eight) hours as needed for nausea.  10 tablet  0   No current facility-administered medications on file prior to visit.   Family History  Problem Relation Age of Onset  . Colon polyps Father   . Diabetes Mother   . Diabetes Father   . Diabetes Brother   . Diabetes Sister   . Heart disease Father   . Heart disease Brother   . Heart disease Sister    History   Social History  . Marital Status: Married    Spouse Name: N/A    Number of Children: 0  . Years of Education:  N/A   Occupational History  . Not on file.   Social History Main Topics  . Smoking status: Never Smoker   . Smokeless tobacco: Never Used  . Alcohol Use: No  . Drug Use: No  . Sexually Active: Not on file   Other Topics Concern  . Not on file   Social History Narrative  . No narrative on file    Review of Systems  Constitutional: Negative for fever, chills, diaphoresis, activity change, appetite change and fatigue.  HENT: Negative for ear pain, nosebleeds, congestion, facial swelling, rhinorrhea, neck pain, neck stiffness and ear discharge.   Eyes: Negative for pain, discharge, redness, itching and visual disturbance.  Respiratory: Negative for cough, choking, chest tightness, shortness of breath, wheezing and stridor.   Cardiovascular: Negative for chest pain, palpitations and leg swelling.  Gastrointestinal: Negative for abdominal distention.  Genitourinary: Negative for dysuria, urgency, positive for frequency, neg for hematuria, flank pain, decreased urine volume, difficulty urinating and dyspareunia.  Musculoskeletal: Negative for back pain, joint swelling, arthralgias and gait problem.  Neurological: Positve for occasional dizziness, Neg for tremors, seizures, syncope, facial asymmetry, speech difficulty, weakness, light-headedness, numbness and headaches.  Hematological: Negative for adenopathy. Does not bruise/bleed easily.  Psychiatric/Behavioral: Negative for hallucinations, behavioral problems, confusion, dysphoric mood, decreased concentration and agitation.    Objective:   Filed Vitals:   03/07/13 1437  BP: 142/85  Pulse: 64  Temp: 98.9 F (37.2 C)  Resp: 18    Physical Exam  Constitutional: Appears well-developed and well-nourished. No distress.  HENT: Normocephalic. External right and left ear normal. Oropharynx is clear and moist.  Eyes: Blind right eye.  Neck: Normal ROM. Neck supple. No JVD. No tracheal deviation. No thyromegaly.  CVS: RRR, S1/S2 +,  no murmurs, no gallops, no carotid bruit.  Pulmonary: Effort and breath sounds normal, no stridor, rhonchi, wheezes, rales.  Abdominal: Soft. BS +,  no distension, tenderness, rebound or guarding.  Musculoskeletal: Normal range of motion. No edema and no tenderness.  Lymphadenopathy: No lymphadenopathy noted, cervical, inguinal. Neuro: Alert. Normal reflexes, muscle tone coordination. No cranial nerve deficit. Insensate feet.  Skin: Skin is warm and dry. No rash noted. Not diaphoretic. No erythema. No pallor.  Psychiatric:  Normal mood and affect. Behavior, judgment, thought content normal.   Lab Results  Component Value Date   WBC 10.0 04/18/2012   HGB 8.5* 04/18/2012   HCT 26.1* 04/18/2012   MCV 79.3 04/18/2012   PLT 488* 04/18/2012   Lab Results  Component Value Date   CREATININE 1.51* 04/18/2012   BUN 22 04/18/2012   NA 130* 04/18/2012   K 3.9 04/18/2012   CL 93* 04/18/2012   CO2 25 04/18/2012    Lab Results  Component Value Date   HGBA1C 9.0 03/07/2013   Lipid Panel     Component Value Date/Time   CHOL 98 01/16/2012 0820   TRIG 104 01/16/2012 0820   HDL 37* 01/16/2012 0820   CHOLHDL 2.6 01/16/2012 0820   VLDL 21 01/16/2012 0820   LDLCALC 40 01/16/2012 0820       Assessment and plan:   Patient Active Problem List   Diagnosis Date Noted  . Hyponatremia 04/18/2012  . Hyperglycemia 04/18/2012  . Anemia   . Obstructive sleep apnea   . Diabetic retinopathy   . Chronic diastolic heart failure   . Hypokalemia 04/11/2012  . Chronic diastolic heart failure, NYHA class 1 04/07/2012  . Presumed OSA (obstructive sleep apnea) 04/06/2012  . Bacterial vaginosis 04/06/2012  . NSTEMI  post-op 04/04/12 04/04/2012  . Diabetes mellitus type 2, uncontrolled 04/03/2012  . Sepsis due to Escherichia coli 04/03/2012  . Gangrenous Perirectal abscess s/p I/D and debridement 9/2 04/03/2012  . Obesity, morbid 01/17/2012  . Nausea and vomiting, secondary to viral syndrome 01/17/2012  . Chest pain at  rest, secondary to viral syndrome.  negative MI 01/15/2012  . GERD 08/21/2009  . DIZZINESS 08/21/2009  . MENORRHAGIA, PERIMENOPAUSAL 03/20/2009  . PERIPHERAL EDEMA 03/20/2009  . CAD, cath 2010, turned down for CABG "poor targets" 02/27/2009  . DIABETES MELLITUS, TYPE II, ON INSULIN, UNCONTROLLED 10/20/2007  . SHOULDER PAIN, LEFT 10/20/2007  . Proliferative diabetic retinopathy(362.02) 10/07/2007  . DETACHED RETINA, BILATERAL, HX OF 09/07/2007  . VISUAL CHANGES 04/18/2007  . History of chronic Iron defeicency anemia with acute post op anemia, transfused after debridment 04/04/12 04/17/2007  . HTN, on multiple medications prior to admission. 04/17/2007   Anemia - Plan: medroxyPROGESTERone (PROVERA) 10 MG tablet, acetaZOLAMIDE (DIAMOX) 500 MG capsule, diltiazem (CARDIZEM CD) 240 MG 24 hr capsule, Blood Glucose Monitoring Suppl (FREESTYLE LITE) DEVI, Lancets (FREESTYLE) lancets, glucose blood (FREESTYLE LITE) test strip, insulin lispro protamine-lispro (HUMALOG MIX 75/25) (75-25) 100 UNIT/ML SUSP injection, POCT A1C, Glucose (CBG), CBC, COMPLETE METABOLIC PANEL WITH GFR, TSH, Microalbumin/Creatinine Ratio, Urine, Lipid panel, CANCELED: HgB A1c, CANCELED: Glucose (CBG)  Chronic diastolic heart failure, NYHA class 1 - Plan: medroxyPROGESTERone (PROVERA) 10 MG tablet, acetaZOLAMIDE (DIAMOX) 500 MG capsule, diltiazem (CARDIZEM CD) 240 MG 24 hr capsule, Blood Glucose Monitoring Suppl (FREESTYLE LITE) DEVI, Lancets (FREESTYLE) lancets, glucose blood (FREESTYLE LITE) test strip, insulin lispro protamine-lispro (HUMALOG MIX 75/25) (75-25) 100 UNIT/ML SUSP injection, POCT A1C, Glucose (CBG), CBC, COMPLETE METABOLIC PANEL WITH GFR, TSH, Microalbumin/Creatinine Ratio, Urine, Lipid panel, CANCELED: HgB A1c, CANCELED: Glucose (CBG)  Chronic diastolic heart failure - Plan: medroxyPROGESTERone (PROVERA) 10 MG tablet, acetaZOLAMIDE (DIAMOX) 500 MG capsule, diltiazem (CARDIZEM CD) 240 MG 24 hr capsule, Blood Glucose  Monitoring Suppl (FREESTYLE LITE) DEVI, Lancets (FREESTYLE) lancets, glucose blood (FREESTYLE LITE) test strip, insulin lispro protamine-lispro (HUMALOG MIX 75/25) (75-25) 100 UNIT/ML SUSP injection, POCT A1C, Glucose (CBG), CBC, COMPLETE METABOLIC PANEL WITH GFR, TSH, Microalbumin/Creatinine Ratio, Urine, Lipid panel, CANCELED: HgB A1c, CANCELED: Glucose (CBG)  DETACHED RETINA, BILATERAL, HX OF - Plan: medroxyPROGESTERone (PROVERA) 10 MG tablet, acetaZOLAMIDE (DIAMOX) 500 MG capsule, diltiazem (CARDIZEM CD) 240 MG 24 hr capsule, Blood Glucose Monitoring Suppl (FREESTYLE LITE) DEVI, Lancets (FREESTYLE) lancets, glucose blood (FREESTYLE LITE) test strip, insulin lispro protamine-lispro (HUMALOG MIX 75/25) (75-25) 100 UNIT/ML SUSP injection, POCT A1C, Glucose (CBG), CBC, COMPLETE METABOLIC PANEL WITH GFR, TSH, Microalbumin/Creatinine Ratio, Urine, Lipid panel, CANCELED: HgB A1c, CANCELED: Glucose (CBG)  DIABETES MELLITUS, TYPE II, ON INSULIN, UNCONTROLLED - Plan: medroxyPROGESTERone (PROVERA) 10 MG tablet, acetaZOLAMIDE (DIAMOX) 500 MG capsule, diltiazem (CARDIZEM CD) 240 MG 24 hr capsule, Blood Glucose Monitoring Suppl (FREESTYLE LITE) DEVI, Lancets (FREESTYLE) lancets, glucose blood (FREESTYLE LITE) test strip, insulin lispro protamine-lispro (HUMALOG MIX 75/25) (75-25) 100 UNIT/ML SUSP injection, POCT A1C, Glucose (CBG), CBC, COMPLETE METABOLIC PANEL WITH GFR, TSH, Microalbumin/Creatinine Ratio, Urine, Lipid panel, CANCELED: HgB A1c, CANCELED: Glucose (CBG)  Diabetic retinopathy, other specified type, without macular edema, with proliferative retinopathy - Plan: medroxyPROGESTERone (PROVERA) 10 MG tablet, acetaZOLAMIDE (DIAMOX) 500 MG capsule, diltiazem (CARDIZEM CD) 240 MG 24 hr capsule, Blood Glucose Monitoring Suppl (FREESTYLE LITE) DEVI, Lancets (FREESTYLE) lancets, glucose blood (FREESTYLE LITE) test strip, insulin lispro protamine-lispro (HUMALOG MIX 75/25) (75-25) 100 UNIT/ML SUSP injection, POCT A1C,  Glucose (CBG), CBC, COMPLETE METABOLIC PANEL WITH GFR, TSH, Microalbumin/Creatinine Ratio, Urine, Lipid panel, CANCELED: HgB A1c, CANCELED: Glucose (CBG)  GERD - Plan: medroxyPROGESTERone (PROVERA) 10 MG tablet, acetaZOLAMIDE (DIAMOX) 500 MG capsule, diltiazem (CARDIZEM CD) 240 MG 24 hr capsule, Blood Glucose Monitoring Suppl (FREESTYLE LITE) DEVI, Lancets (FREESTYLE) lancets, glucose blood (FREESTYLE LITE) test strip, insulin lispro protamine-lispro (HUMALOG MIX 75/25) (75-25) 100 UNIT/ML SUSP injection, POCT A1C, Glucose (CBG), CBC, COMPLETE METABOLIC PANEL WITH GFR, TSH, Microalbumin/Creatinine Ratio, Urine, Lipid panel, CANCELED: HgB A1c, CANCELED: Glucose (CBG)  Hyperglycemia - Plan: medroxyPROGESTERone (PROVERA) 10 MG tablet, acetaZOLAMIDE (DIAMOX) 500 MG capsule, diltiazem (CARDIZEM CD) 240 MG 24 hr capsule, Blood Glucose Monitoring Suppl (FREESTYLE LITE) DEVI, Lancets (FREESTYLE) lancets, glucose blood (FREESTYLE LITE) test strip, insulin lispro protamine-lispro (HUMALOG MIX 75/25) (75-25) 100 UNIT/ML SUSP injection, POCT A1C, Glucose (CBG), CBC, COMPLETE METABOLIC PANEL WITH GFR, TSH, Microalbumin/Creatinine Ratio, Urine, Lipid panel, CANCELED: HgB A1c, CANCELED: Glucose (CBG)  Obstructive sleep apnea - Plan: medroxyPROGESTERone (PROVERA) 10 MG tablet, acetaZOLAMIDE (DIAMOX) 500 MG capsule, diltiazem (CARDIZEM CD) 240 MG 24 hr capsule, Blood Glucose Monitoring Suppl (FREESTYLE LITE) DEVI, Lancets (FREESTYLE) lancets, glucose blood (FREESTYLE LITE) test strip, insulin lispro protamine-lispro (HUMALOG MIX 75/25) (75-25) 100 UNIT/ML SUSP injection, POCT A1C, Glucose (CBG), CBC, COMPLETE METABOLIC PANEL WITH GFR, TSH, Microalbumin/Creatinine Ratio, Urine, Lipid panel, CANCELED: HgB A1c, CANCELED: Glucose (CBG)  Proliferative diabetic retinopathy(362.02) - Plan: medroxyPROGESTERone (PROVERA) 10 MG tablet, acetaZOLAMIDE (DIAMOX) 500 MG capsule, diltiazem (CARDIZEM CD) 240 MG 24 hr capsule, Blood Glucose  Monitoring Suppl (FREESTYLE LITE) DEVI, Lancets (FREESTYLE) lancets, glucose blood (FREESTYLE LITE) test strip, insulin lispro protamine-lispro (HUMALOG MIX 75/25) (75-25) 100 UNIT/ML SUSP injection, POCT A1C, Glucose (CBG), CBC, COMPLETE METABOLIC PANEL WITH GFR, TSH, Microalbumin/Creatinine Ratio, Urine, Lipid panel, CANCELED: HgB A1c, CANCELED: Glucose (CBG)  Follow lab results  RTC in 2 weeks  Obtain Medical Records  The patient was given clear instructions to go to ER or return to medical center if symptoms don't improve, worsen or new problems develop.  The patient verbalized understanding.  The patient was told to call to get any lab results if not heard anything in the next week.    Gerlene Fee, MD, CDE, [place], [place]

## 2013-03-07 NOTE — Progress Notes (Signed)
Patient presents to establish care and for DM medication management and refills.

## 2013-03-08 ENCOUNTER — Other Ambulatory Visit: Payer: Self-pay | Admitting: Family Medicine

## 2013-03-08 DIAGNOSIS — E133599 Other specified diabetes mellitus with proliferative diabetic retinopathy without macular edema, unspecified eye: Secondary | ICD-10-CM

## 2013-03-08 DIAGNOSIS — E11359 Type 2 diabetes mellitus with proliferative diabetic retinopathy without macular edema: Secondary | ICD-10-CM

## 2013-03-08 DIAGNOSIS — K219 Gastro-esophageal reflux disease without esophagitis: Secondary | ICD-10-CM

## 2013-03-08 DIAGNOSIS — E1159 Type 2 diabetes mellitus with other circulatory complications: Secondary | ICD-10-CM

## 2013-03-08 DIAGNOSIS — Z8669 Personal history of other diseases of the nervous system and sense organs: Secondary | ICD-10-CM

## 2013-03-08 DIAGNOSIS — R739 Hyperglycemia, unspecified: Secondary | ICD-10-CM

## 2013-03-08 DIAGNOSIS — I5032 Chronic diastolic (congestive) heart failure: Secondary | ICD-10-CM

## 2013-03-08 DIAGNOSIS — D649 Anemia, unspecified: Secondary | ICD-10-CM

## 2013-03-08 DIAGNOSIS — G4733 Obstructive sleep apnea (adult) (pediatric): Secondary | ICD-10-CM

## 2013-03-08 MED ORDER — FREESTYLE LITE DEVI: 1.0000 | Status: DC

## 2013-03-14 LAB — CBC
MCH: 31.1 pg (ref 26.0–34.0)
MCHC: 33 g/dL (ref 30.0–36.0)
MCV: 94.3 fL (ref 78.0–100.0)
Platelets: 216 10*3/uL (ref 150–400)
RDW: 13.7 % (ref 11.5–15.5)

## 2013-03-14 LAB — COMPLETE METABOLIC PANEL WITH GFR
ALT: <8 U/L (ref 0–35)
AST: 10 U/L (ref 0–37)
Alkaline Phosphatase: 44 U/L (ref 39–117)
CO2: 21 mEq/L (ref 19–32)
Creat: 2.04 mg/dL — ABNORMAL HIGH (ref 0.50–1.10)
GFR, Est African American: 30 mL/min — ABNORMAL LOW
Total Bilirubin: 0.4 mg/dL (ref 0.3–1.2)

## 2013-03-15 ENCOUNTER — Telehealth: Payer: Self-pay | Admitting: *Deleted

## 2013-03-15 LAB — MICROALBUMIN / CREATININE URINE RATIO
Creatinine, Urine: 214.9 mg/dL
Microalb, Ur: 2.71 mg/dL — ABNORMAL HIGH (ref 0.00–1.89)

## 2013-03-15 NOTE — Progress Notes (Signed)
Quick Note:  Please inform patient that her labs show that her kidney function has gotten much worse over the past year. Her blood sugars are very elevated. She needs to avoid all NSAIDS medications like ibuprofen, aleve, goody powders, etc. Please follow up in office next week to discuss kidney function and diabetes. Very Important.   Gerlene Fee, MD, CDE, [place], [place]   ______

## 2013-03-15 NOTE — Telephone Encounter (Signed)
03/15/13 Patient made aware of lab resultsPlease inform patient that her labs show that her kidney function has gotten much worse over the past year. Her blood sugars are very elevated. She needs to avoid all NSAIDS medications like ibuprofen, aleve, goody powders, etc. Please follow up in office next week to discuss kidney function and diabetes. Very Important Patient has appointment schedule for 03/22/13 P[facility]

## 2013-03-16 ENCOUNTER — Other Ambulatory Visit: Payer: Self-pay | Admitting: Emergency Medicine

## 2013-03-16 DIAGNOSIS — K219 Gastro-esophageal reflux disease without esophagitis: Secondary | ICD-10-CM

## 2013-03-16 DIAGNOSIS — E11359 Type 2 diabetes mellitus with proliferative diabetic retinopathy without macular edema: Secondary | ICD-10-CM

## 2013-03-16 DIAGNOSIS — R739 Hyperglycemia, unspecified: Secondary | ICD-10-CM

## 2013-03-16 DIAGNOSIS — G4733 Obstructive sleep apnea (adult) (pediatric): Secondary | ICD-10-CM

## 2013-03-16 DIAGNOSIS — I5032 Chronic diastolic (congestive) heart failure: Secondary | ICD-10-CM

## 2013-03-16 DIAGNOSIS — N924 Excessive bleeding in the premenopausal period: Secondary | ICD-10-CM

## 2013-03-16 DIAGNOSIS — E1159 Type 2 diabetes mellitus with other circulatory complications: Secondary | ICD-10-CM

## 2013-03-16 DIAGNOSIS — D649 Anemia, unspecified: Secondary | ICD-10-CM

## 2013-03-16 DIAGNOSIS — Z8669 Personal history of other diseases of the nervous system and sense organs: Secondary | ICD-10-CM

## 2013-03-16 DIAGNOSIS — E133599 Other specified diabetes mellitus with proliferative diabetic retinopathy without macular edema, unspecified eye: Secondary | ICD-10-CM

## 2013-03-19 MED ORDER — FREESTYLE LANCETS MISC: Status: DC

## 2013-03-19 MED ORDER — MEDROXYPROGESTERONE ACETATE 10 MG PO TABS: 10.0000 mg | ORAL_TABLET | Freq: Every day | ORAL | Status: DC

## 2013-03-21 ENCOUNTER — Encounter: Payer: Self-pay | Admitting: Cardiology

## 2013-03-21 ENCOUNTER — Ambulatory Visit: Payer: Medicare Other | Admitting: Family Medicine

## 2013-03-21 ENCOUNTER — Other Ambulatory Visit: Payer: Self-pay | Admitting: *Deleted

## 2013-03-21 DIAGNOSIS — E1159 Type 2 diabetes mellitus with other circulatory complications: Secondary | ICD-10-CM

## 2013-03-21 MED ORDER — FREESTYLE LANCETS MISC: Status: DC

## 2013-03-22 ENCOUNTER — Encounter: Payer: Self-pay | Admitting: Cardiovascular Disease

## 2013-03-22 ENCOUNTER — Ambulatory Visit: Payer: Medicare Other | Attending: Family Medicine | Admitting: Internal Medicine

## 2013-03-22 VITALS — BP 124/73 | HR 78 | Temp 97.7°F | Resp 16 | Wt 266.6 lb

## 2013-03-22 DIAGNOSIS — N179 Acute kidney failure, unspecified: Secondary | ICD-10-CM

## 2013-03-22 DIAGNOSIS — E1310 Other specified diabetes mellitus with ketoacidosis without coma: Secondary | ICD-10-CM

## 2013-03-22 DIAGNOSIS — E1110 Type 2 diabetes mellitus with ketoacidosis without coma: Secondary | ICD-10-CM

## 2013-03-22 DIAGNOSIS — I10 Essential (primary) hypertension: Secondary | ICD-10-CM | POA: Insufficient documentation

## 2013-03-22 LAB — COMPREHENSIVE METABOLIC PANEL
ALT: 10 U/L (ref 0–35)
AST: 12 U/L (ref 0–37)
CO2: 21 mEq/L (ref 19–32)
Calcium: 9.2 mg/dL (ref 8.4–10.5)
Chloride: 102 mEq/L (ref 96–112)
Creat: 1.74 mg/dL — ABNORMAL HIGH (ref 0.50–1.10)
Potassium: 3.1 mEq/L — ABNORMAL LOW (ref 3.5–5.3)
Total Protein: 7.3 g/dL (ref 6.0–8.3)

## 2013-03-22 LAB — POCT GLYCOSYLATED HEMOGLOBIN (HGB A1C): Hemoglobin A1C: 9.4

## 2013-03-22 MED ORDER — FREESTYLE SYSTEM KIT: 1.0000 | PACK | Status: DC | PRN

## 2013-03-22 NOTE — Progress Notes (Signed)
Patient ID: [name], female   DOB: 11-Jul-1956, 57 y.o.   MRN: [id number]  CC:  HPI:  57 year old female with a history of coronary artery disease, insulin-dependent diabetes Diabetic retinopathy, who is here for evaluation of her kidney function. Patient's creatinine has gone from 0.8 and 2.04 and the last one year. She occasionally complains of orthostatic symptoms when she takes her blood pressure medicines in the morning Today her blood pressures in the 120s. She does not have a glucometer to measure her CBG at home, she's not quite sure why she gets dizzy in the morning and before going to bed She states that she has to get up very slowly, to prevent the dizziness. She denies any chest pain any shortness of breath. She denies any urinary urgency frequency or dysuria   Allergies  Allergen Reactions  . Lorazepam Anaphylaxis  . Orange Fruit [Citrus] Anaphylaxis    Pt stated throat swelling, itching   Past Medical History  Diagnosis Date  . Diabetes mellitus   . Hypertension   . Hyperlipemia   . Coronary artery disease     Medical Rx  . GERD (gastroesophageal reflux disease)   . Morbid obesity with BMI of 40.0-44.9, adult   . Anemia   . Obstructive sleep apnea     no sleep study  . Diabetic retinopathy   . Chronic diastolic heart failure Q000111Q    grade 1 by echo, EF 55-60%   Current Outpatient Prescriptions on File Prior to Visit  Medication Sig Dispense Refill  . acetaminophen (TYLENOL) 500 MG tablet Take 500 mg by mouth every 6 (six) hours as needed. For pain      . acetaZOLAMIDE (DIAMOX) 500 MG capsule       . aspirin 81 MG tablet Take 81 mg by mouth daily.      . bisacodyl (DULCOLAX) 5 MG EC tablet Take 5 mg by mouth daily as needed. For constipation      . Blood Glucose Monitoring Suppl (FREESTYLE LITE) DEVI 1 Device by Does not apply route as directed. Use glucose meter to test blood glucose 4 times per day. Pt is on insulin.  Dx 250.02  1 each  1  . Blood  Glucose Monitoring Suppl W/DEVICE KIT 1 each by Does not apply route 3 (three) times daily.  1 each  0  . brimonidine-timolol (COMBIGAN) 0.2-0.5 % ophthalmic solution Place 1 drop into both eyes every 12 (twelve) hours.      Marland Kitchen diltiazem (CARDIZEM CD) 240 MG 24 hr capsule       . diltiazem (DILACOR XR) 240 MG 24 hr capsule Take 1 capsule (240 mg total) by mouth daily.  90 capsule  3  . docusate sodium (COLACE) 50 MG capsule Take 100 mg by mouth 2 (two) times daily as needed for constipation.      Marland Kitchen ezetimibe-simvastatin (VYTORIN) 10-40 MG per tablet Take 1 tablet by mouth daily.  90 tablet  3  . ferrous sulfate 325 (65 FE) MG tablet Take 325 mg by mouth daily with breakfast.      . furosemide (LASIX) 40 MG tablet Take 40 mg by mouth 2 (two) times daily.       Marland Kitchen glucose blood (FREESTYLE LITE) test strip Use as instructed  100 each  12  . hydrALAZINE (APRESOLINE) 50 MG tablet Take 50 mg by mouth 2 (two) times daily.      . insulin lispro (HUMALOG) 100 UNIT/ML injection Inject 65 Units into the skin  2 (two) times daily.  10 mL  3  . insulin lispro protamine-lispro (HUMALOG MIX 75/25) (75-25) 100 UNIT/ML SUSP injection Inject 65 Units into the skin 2 (two) times daily with a meal.  40 mL  5  . Lancets (FREESTYLE) lancets Use as instructed  100 each  12  . losartan-hydrochlorothiazide (HYZAAR) 100-25 MG per tablet Take 1 tablet by mouth daily.  90 tablet  3  . medroxyPROGESTERone (PROVERA) 10 MG tablet Take 1 tablet (10 mg total) by mouth daily.  30 tablet  0  . metoprolol (TOPROL-XL) 200 MG 24 hr tablet Take 1 tablet (200 mg total) by mouth daily.  90 tablet  3  . nitroGLYCERIN (NITROSTAT) 0.4 MG SL tablet Place 0.4 mg under the tongue every 5 (five) minutes as needed. For chest pain      . ondansetron (ZOFRAN ODT) 4 MG disintegrating tablet Take 1 tablet (4 mg total) by mouth every 8 (eight) hours as needed for nausea.  10 tablet  0  . pantoprazole (PROTONIX) 40 MG tablet Take 40 mg by mouth daily.       . polyethylene glycol powder (GLYCOLAX/MIRALAX) powder Take 17 g by mouth daily.      . polyvinyl alcohol (LIQUIFILM TEARS) 1.4 % ophthalmic solution Place 1 drop into both eyes daily as needed. For dry eyes      . potassium chloride (K-DUR,KLOR-CON) 10 MEQ tablet Take 1 tablet (10 mEq total) by mouth daily.  90 tablet  3  . ranolazine (RANEXA) 500 MG 12 hr tablet Take 500 mg by mouth 2 (two) times daily.      Marland Kitchen RELION INSULIN SYR 1CC/30G 30G X 5/16" 1 ML MISC        No current facility-administered medications on file prior to visit.   Family History  Problem Relation Age of Onset  . Colon polyps Father   . Diabetes Mother   . Diabetes Father   . Diabetes Brother   . Diabetes Sister   . Heart disease Father   . Heart disease Brother   . Heart disease Sister    History   Social History  . Marital Status: Married    Spouse Name: N/A    Number of Children: 0  . Years of Education: N/A   Occupational History  . Not on file.   Social History Main Topics  . Smoking status: Never Smoker   . Smokeless tobacco: Never Used  . Alcohol Use: No  . Drug Use: No  . Sexual Activity: Not on file   Other Topics Concern  . Not on file   Social History Narrative  . No narrative on file    Review of Systems  Constitutional: Negative for fever, chills, diaphoresis, activity change, appetite change and fatigue.  HENT: Negative for ear pain, nosebleeds, congestion, facial swelling, rhinorrhea, neck pain, neck stiffness and ear discharge.   Eyes: Negative for pain, discharge, redness, itching and visual disturbance.  Respiratory: Negative for cough, choking, chest tightness, shortness of breath, wheezing and stridor.   Cardiovascular: Negative for chest pain, palpitations and leg swelling.  Gastrointestinal: Negative for abdominal distention.  Genitourinary: Negative for dysuria, urgency, frequency, hematuria, flank pain, decreased urine volume, difficulty urinating and dyspareunia.   Musculoskeletal: Negative for back pain, joint swelling, arthralgias and gait problem.  Neurological: Negative for dizziness, tremors, seizures, syncope, facial asymmetry, speech difficulty, weakness, light-headedness, numbness and headaches.  Hematological: Negative for adenopathy. Does not bruise/bleed easily.  Psychiatric/Behavioral: Negative for hallucinations, behavioral problems,  confusion, dysphoric mood, decreased concentration and agitation.    Objective:   Filed Vitals:   03/22/13 1120  BP: 124/73  Pulse: 78  Temp: 97.7 F (36.5 C)  Resp: 16    Physical Exam  Constitutional: Appears well-developed and well-nourished. No distress.  HENT: Normocephalic. External right and left ear normal. Oropharynx is clear and moist.  Eyes: Conjunctivae and EOM are normal. PERRLA, no scleral icterus.  Neck: Normal ROM. Neck supple. No JVD. No tracheal deviation. No thyromegaly.  CVS: RRR, S1/S2 +, no murmurs, no gallops, no carotid bruit.  Pulmonary: Effort and breath sounds normal, no stridor, rhonchi, wheezes, rales.  Abdominal: Soft. BS +,  no distension, tenderness, rebound or guarding.  Musculoskeletal: Normal range of motion. No edema and no tenderness.  Lymphadenopathy: No lymphadenopathy noted, cervical, inguinal. Neuro: Alert. Normal reflexes, muscle tone coordination. No cranial nerve deficit. Skin: Skin is warm and dry. No rash noted. Not diaphoretic. No erythema. No pallor.  Psychiatric: Normal mood and affect. Behavior, judgment, thought content normal.   Lab Results  Component Value Date   WBC 10.2 03/14/2013   HGB 15.4* 03/14/2013   HCT 46.7* 03/14/2013   MCV 94.3 03/14/2013   PLT 216 03/14/2013   Lab Results  Component Value Date   CREATININE 2.04* 03/14/2013   BUN 28* 03/14/2013   NA 136 03/14/2013   K 3.5 03/14/2013   CL 105 03/14/2013   CO2 21 03/14/2013    Lab Results  Component Value Date   HGBA1C 9.0 03/07/2013   Lipid Panel     Component Value Date/Time    CHOL 86 03/14/2013 1015   TRIG 92 03/14/2013 1015   HDL 29* 03/14/2013 1015   CHOLHDL 3.0 03/14/2013 1015   VLDL 18 03/14/2013 1015   LDLCALC 39 03/14/2013 1015       Assessment and plan:   Patient Active Problem List   Diagnosis Date Noted  . Chronic diastolic heart failure, NYHA class 1 04/07/2012  . Presumed OSA (obstructive sleep apnea) 04/06/2012  . NSTEMI  post-op 04/04/12-medical Rx 04/04/2012  . Diabetes mellitus type 2, uncontrolled 04/03/2012  . Gangrenous Perirectal abscess s/p I/D and debridement 9/2 04/03/2012  . Obesity, morbid 01/17/2012  . GERD 08/21/2009  . DIZZINESS 08/21/2009  . MENORRHAGIA, PERIMENOPAUSAL 03/20/2009  . PERIPHERAL EDEMA 03/20/2009  . CAD, cath 2010, turned down for CABG "poor targets" 02/27/2009  . SHOULDER PAIN, LEFT 10/20/2007  . Proliferative diabetic retinopathy(362.02) 10/07/2007  . DETACHED RETINA, BILATERAL, HX OF 09/07/2007  . VISUAL CHANGES 04/18/2007  . History of chronic Iron defeicency anemia with acute post op anemia, transfused after debridment 04/04/12 04/17/2007  . HTN, on multiple medications prior to admission. 04/17/2007       Hypertension Patient is on lisinopril/HCTZ She is also on Lasix. She denies any change in her urine output She does have microalbuminuria and therefore is on an ACE inhibitor Patient may have a component of autonomic neuropathy We'll check orthostatics today Patient has an appointment with cardiology tomorrow, therefore will defer any medication adjustments to cardiology   Acute on chronic renal insufficiency Will obtain UA, renal ultrasound, urine sodium urine creatinine Nephrology referral provided  Diabetes mellitus, will re\re prescribe glucometer patient has been unable to obtain this for about 3 weeks Followup in one month

## 2013-03-22 NOTE — Progress Notes (Signed)
Patient here for follow up Has been having kidney issues Dark urine Son seems to think it may be from a new pill prescribed for her eyes

## 2013-03-23 ENCOUNTER — Ambulatory Visit: Payer: Medicare Other | Admitting: Cardiovascular Disease

## 2013-03-23 LAB — URINALYSIS W MICROSCOPIC + REFLEX CULTURE
Casts: NONE SEEN
Glucose, UA: NEGATIVE mg/dL
Leukocytes, UA: NEGATIVE
Specific Gravity, Urine: 1.007 (ref 1.005–1.030)
Squamous Epithelial / LPF: NONE SEEN
pH: 5.5 (ref 5.0–8.0)

## 2013-03-26 ENCOUNTER — Other Ambulatory Visit: Payer: Self-pay | Admitting: *Deleted

## 2013-03-26 MED ORDER — HYDRALAZINE HCL 50 MG PO TABS: 50.0000 mg | ORAL_TABLET | Freq: Two times a day (BID) | ORAL | Status: DC

## 2013-03-27 ENCOUNTER — Ambulatory Visit (HOSPITAL_COMMUNITY)
Admission: RE | Admit: 2013-03-27 | Discharge: 2013-03-27 | Disposition: A | Discharge status: Home / Self Care | Payer: Medicare Other | Source: Ambulatory Visit | Attending: Internal Medicine | Admitting: Internal Medicine

## 2013-03-27 DIAGNOSIS — N189 Chronic kidney disease, unspecified: Secondary | ICD-10-CM | POA: Insufficient documentation

## 2013-03-27 DIAGNOSIS — N179 Acute kidney failure, unspecified: Secondary | ICD-10-CM

## 2013-03-29 ENCOUNTER — Ambulatory Visit (INDEPENDENT_AMBULATORY_CARE_PROVIDER_SITE_OTHER): Payer: Medicare Other | Admitting: Cardiovascular Disease

## 2013-03-29 ENCOUNTER — Encounter: Payer: Self-pay | Admitting: Cardiovascular Disease

## 2013-03-29 VITALS — BP 120/74 | HR 67 | Resp 16 | Ht 63.0 in | Wt 266.3 lb

## 2013-03-29 DIAGNOSIS — I2510 Atherosclerotic heart disease of native coronary artery without angina pectoris: Secondary | ICD-10-CM

## 2013-03-29 DIAGNOSIS — I10 Essential (primary) hypertension: Secondary | ICD-10-CM

## 2013-03-29 DIAGNOSIS — E785 Hyperlipidemia, unspecified: Secondary | ICD-10-CM

## 2013-03-29 DIAGNOSIS — I5032 Chronic diastolic (congestive) heart failure: Secondary | ICD-10-CM

## 2013-03-29 DIAGNOSIS — E1165 Type 2 diabetes mellitus with hyperglycemia: Secondary | ICD-10-CM

## 2013-03-29 DIAGNOSIS — E669 Obesity, unspecified: Secondary | ICD-10-CM

## 2013-03-29 MED ORDER — RANOLAZINE ER 500 MG PO TB12: 500.0000 mg | ORAL_TABLET | Freq: Two times a day (BID) | ORAL | Status: DC

## 2013-03-29 MED ORDER — HYDRALAZINE HCL 50 MG PO TABS: 50.0000 mg | ORAL_TABLET | Freq: Two times a day (BID) | ORAL | Status: DC

## 2013-03-29 NOTE — Patient Instructions (Addendum)
Decrease Hydralazine to twice a day.  Your physician recommends that you schedule a follow-up appointment in: 3 months

## 2013-04-01 ENCOUNTER — Other Ambulatory Visit: Payer: Self-pay | Admitting: Internal Medicine

## 2013-04-02 DIAGNOSIS — E785 Hyperlipidemia, unspecified: Secondary | ICD-10-CM | POA: Insufficient documentation

## 2013-04-02 NOTE — Assessment & Plan Note (Signed)
Lipid parameters are actually very good with the exception of the low HDL cholesterol. No changes are recommended.

## 2013-04-02 NOTE — Assessment & Plan Note (Addendum)
Poor control. Her hemoglobin A1c is 9.4% has actually worsened when compared to one month earlier. Dictated by retinopathy, nephropathy and neuropathy.

## 2013-04-02 NOTE — Assessment & Plan Note (Addendum)
The control on the current rather complicated regimen is good, possibly excessive. She has occasional fairly significant orthostatic hypotension. I have recommended support stockings but she told me she can afford to buy any of time. Have recommended that she decrease the frequency of hydralazine to twice daily.

## 2013-04-02 NOTE — Assessment & Plan Note (Addendum)
In addition there is now even less insensitive for revascularization procedures because of the same worsening of her renal function. Medical therapy is the best option. The dose of Ranexa should not be increased because of her reduced GFR.

## 2013-04-02 NOTE — Progress Notes (Signed)
Patient ID: CHERE Burns, female   DOB: 08-26-1955, 57 y.o.   MRN: [id number]    Reason for office visit Followup coronary artery disease  Mrs. [name] is a 57 year old woman with diabetes mellitus and extensive diffuse coronary disease felt to be is best suited for medical therapy. Her distal targets for coronary bypass were felt to be very poor after evaluation I progressed her surgery in 2010. She has generally done well with medical therapy without any major events.  She has type 2 diabetes mellitus complicated by retinopathy, unilateral blindness and also has systemic hypertension and chronic well compensated diastolic heart failure. She is obstructive sleep apnea. Roughly one year ago she had a prolonged treatment for sepsis related to a Escherichia coli perineal abscess. She lives in a nursing facility for a while but is now living independently again.  Recently she has had some pain in her posterior neck and shoulders and chest which is quite different from her previous angina and is positional. It sounds like cervical neuralgia.   She has a lot of problems with orthostatic hypotension. She has varicose veins. She has symptoms of diabetic neuropathy in both her hands and her feet. She has lower extremity edema that generally resolves almost completely after overnight recumbent position and is worse towards the end of the day.   She is now seeing a nephrologist since there has been gradual worsening of her kidney function.    Allergies  Allergen Reactions  . Lorazepam Anaphylaxis  . Orange Fruit [Citrus] Anaphylaxis    Pt stated throat swelling, itching    Current Outpatient Prescriptions  Medication Sig Dispense Refill  . acetaminophen (TYLENOL) 500 MG tablet Take 500 mg by mouth every 6 (six) hours as needed. For pain      . acetaZOLAMIDE (DIAMOX) 500 MG capsule       . aspirin 81 MG tablet Take 81 mg by mouth daily.      . bisacodyl (DULCOLAX) 5 MG EC tablet Take 5 mg by  mouth daily as needed. For constipation      . Blood Glucose Monitoring Suppl (FREESTYLE LITE) DEVI 1 Device by Does not apply route as directed. Use glucose meter to test blood glucose 4 times per day. Pt is on insulin.  Dx 250.02  1 each  1  . Blood Glucose Monitoring Suppl W/DEVICE KIT 1 each by Does not apply route 3 (three) times daily.  1 each  0  . brimonidine-timolol (COMBIGAN) 0.2-0.5 % ophthalmic solution Place 1 drop into both eyes every 12 (twelve) hours.      Marland Kitchen diltiazem (DILACOR XR) 240 MG 24 hr capsule Take 1 capsule (240 mg total) by mouth daily.  90 capsule  3  . docusate sodium (COLACE) 50 MG capsule Take 100 mg by mouth 2 (two) times daily as needed for constipation.      Marland Kitchen ezetimibe-simvastatin (VYTORIN) 10-40 MG per tablet Take 1 tablet by mouth daily.  90 tablet  3  . ferrous sulfate 325 (65 FE) MG tablet Take 325 mg by mouth daily with breakfast.      . furosemide (LASIX) 40 MG tablet Take 40 mg by mouth 2 (two) times daily.       Marland Kitchen glucose blood (FREESTYLE LITE) test strip Use as instructed  100 each  12  . glucose monitoring kit (FREESTYLE) monitoring kit 1 each by Does not apply route as needed for other.  1 each  2  . hydrALAZINE (APRESOLINE) 50 MG tablet  Take 1 tablet (50 mg total) by mouth 2 (two) times daily.  60 tablet  6  . insulin lispro (HUMALOG) 100 UNIT/ML injection Inject 65 Units into the skin 2 (two) times daily.  10 mL  3  . Lancets (FREESTYLE) lancets Use as instructed  100 each  12  . losartan-hydrochlorothiazide (HYZAAR) 100-25 MG per tablet Take 1 tablet by mouth daily.  90 tablet  3  . medroxyPROGESTERone (PROVERA) 10 MG tablet Take 1 tablet (10 mg total) by mouth daily.  30 tablet  0  . metoprolol (TOPROL-XL) 200 MG 24 hr tablet Take 1 tablet (200 mg total) by mouth daily.  90 tablet  3  . nitroGLYCERIN (NITROSTAT) 0.4 MG SL tablet Place 0.4 mg under the tongue every 5 (five) minutes as needed. For chest pain      . ondansetron (ZOFRAN ODT) 4 MG  disintegrating tablet Take 1 tablet (4 mg total) by mouth every 8 (eight) hours as needed for nausea.  10 tablet  0  . pantoprazole (PROTONIX) 40 MG tablet Take 40 mg by mouth daily.      . polyethylene glycol powder (GLYCOLAX/MIRALAX) powder Take 17 g by mouth daily.      . polyvinyl alcohol (LIQUIFILM TEARS) 1.4 % ophthalmic solution Place 1 drop into both eyes daily as needed. For dry eyes      . potassium chloride (K-DUR,KLOR-CON) 10 MEQ tablet Take 1 tablet (10 mEq total) by mouth daily.  90 tablet  3  . ranolazine (RANEXA) 500 MG 12 hr tablet Take 1 tablet (500 mg total) by mouth 2 (two) times daily.  60 tablet  6  . RELION INSULIN SYR 1CC/30G 30G X 5/16" 1 ML MISC       . insulin lispro protamine-lispro (HUMALOG MIX 75/25) (75-25) 100 UNIT/ML SUSP injection Inject 65 Units into the skin 2 (two) times daily with a meal.  40 mL  5   No current facility-administered medications for this visit.    Past Medical History  Diagnosis Date  . Diabetes mellitus   . Hypertension   . Hyperlipemia   . Coronary artery disease     Medical Rx  . GERD (gastroesophageal reflux disease)   . Morbid obesity with BMI of 40.0-44.9, adult   . Anemia   . Obstructive sleep apnea     no sleep study  . Diabetic retinopathy   . Chronic diastolic heart failure Q000111Q    grade 1 by echo, EF 55-60%    Past Surgical History  Procedure Laterality Date  . Cardiac catheterization  July 2010  . Incision and drainage perirectal abscess  04/03/2012  . Groin abscess removed      left  . Partial hysterectomy  1981    Family History  Problem Relation Age of Onset  . Colon polyps Father   . Diabetes Mother   . Diabetes Father   . Diabetes Brother   . Diabetes Sister   . Heart disease Father   . Heart disease Brother   . Heart disease Sister     History   Social History  . Marital Status: Married    Spouse Name: N/A    Number of Children: 0  . Years of Education: N/A   Occupational History  . Not on  file.   Social History Main Topics  . Smoking status: Never Smoker   . Smokeless tobacco: Never Used  . Alcohol Use: No  . Drug Use: No  . Sexual Activity: Not  on file   Other Topics Concern  . Not on file   Social History Narrative  . No narrative on file    Review of systems: Numerous musculoskeletal complaints especially in her cervical spine lumbar spine and shoulders. Lower extremity edema that comes and goes. Orthostatic dizziness without syncope. She denies chest pain or any change in her chronic pattern of mild to moderate shortness of breath on exertion. She does not have orthopnea present nocturnal dyspnea. She has not had recent fever chills or skin rashes. She denies anorexia. She does not have change in bowel pattern. She has not had focal neurological complaints other than chronic neuropathic numbness in all 4 extremities.  PHYSICAL EXAM BP 120/74  Pulse 67  Resp 16  Ht 5\' 3"  (1.6 m)  Wt 266 lb 4.8 oz (120.793 kg)  BMI 47.18 kg/m2 Morbidly obese this makes for a very difficult and rather incomplete physical exam General: Alert, oriented x3, no distress Head: no evidence of trauma, PERRL, EOMI, no exophtalmos or lid lag, no myxedema, no xanthelasma; normal ears, nose and oropharynx Neck: normal jugular venous pulsations and no hepatojugular reflux; brisk carotid pulses without delay and no carotid bruits Chest: clear to auscultation, no signs of consolidation by percussion or palpation, normal fremitus, symmetrical and full respiratory excursions Cardiovascular: normal position and quality of the apical impulse, regular rhythm, normal first and second heart sounds, no murmurs, rubs or gallops Abdomen: no tenderness or distention, no masses by palpation, no abnormal pulsatility or arterial bruits, normal bowel sounds, no hepatosplenomegaly Extremities: no clubbing, cyanosis and 2+ lateral ankle edema; 2+ radial, ulnar and brachial pulses bilaterally; 2+ right femoral,  posterior tibial and dorsalis pedis pulses; 2+ left femoral, posterior tibial and dorsalis pedis pulses; no subclavian or femoral bruits Neurological: grossly nonfocal   EKG: Sinus rhythm. Probable LVH masked by obesity. Downsloping ST segment depression and inverted T waves in virtually all leads with the exception of one in aVL. Report is a changes are similar to those seen on previous tracings. The severity of ST segment depression waxes and wanes to some degree  Lipid Panel     Component Value Date/Time   CHOL 86 03/14/2013 1015   TRIG 92 03/14/2013 1015   HDL 29* 03/14/2013 1015   CHOLHDL 3.0 03/14/2013 1015   VLDL 18 03/14/2013 1015   LDLCALC 39 03/14/2013 1015    BMET    Component Value Date/Time   NA 136 03/22/2013 1159   K 3.1* 03/22/2013 1159   CL 102 03/22/2013 1159   CO2 21 03/22/2013 1159   GLUCOSE 196* 03/22/2013 1159   BUN 27* 03/22/2013 1159   CREATININE 1.74* 03/22/2013 1159   CREATININE 1.51* 04/18/2012 1307   CALCIUM 9.2 03/22/2013 1159   GFRNONAA 37* 04/18/2012 1307   GFRAA 43* 04/18/2012 1307     ASSESSMENT AND PLAN Chronic diastolic heart failure, NYHA class 1 She appears to be possibly mildly hypervolemic, but I would not recommend more diuretic therapy since there is a clear pattern of worsening renal function.  CAD, cath 2010, turned down for CABG "poor targets" In addition there is now even less insensitive for revascularization procedures because of the same worsening of her renal function. Medical therapy is the best option. The dose of Ranexa should not be increased because of her reduced GFR.  HTN, on multiple medications prior to admission. The control on the current rather complicated regimen is good, possibly excessive. She has occasional fairly significant orthostatic hypotension. I have  recommended support stockings but she told me she can afford to buy any of time. Have recommended that she decrease the frequency of hydralazine to twice daily.  Diabetes  mellitus type 2, uncontrolled Poor control. Her hemoglobin A1c is 9.4% has actually worsened when compared to one month earlier. Dictated by retinopathy, nephropathy and neuropathy.  Dyslipidemia Lipid parameters are actually very good with the exception of the low HDL cholesterol. No changes are recommended.  Obesity, morbid     her blood pressure is excellent. Decreasing the dose of hydralazine may help prevent dizziness and may even reduce the degree of edema. No other changes are major medications Orders Placed This Encounter  Procedures  . EKG 12-Lead   Meds ordered this encounter  Medications  . hydrALAZINE (APRESOLINE) 50 MG tablet    Sig: Take 1 tablet (50 mg total) by mouth 2 (two) times daily.    Dispense:  60 tablet    Refill:  6  . ranolazine (RANEXA) 500 MG 12 hr tablet    Sig: Take 1 tablet (500 mg total) by mouth 2 (two) times daily.    Dispense:  60 tablet    Refill:  [place] Ladeja Pelham, MD, [place] [phone] office [phone] pager

## 2013-04-02 NOTE — Assessment & Plan Note (Signed)
She appears to be possibly mildly hypervolemic, but I would not recommend more diuretic therapy since there is a clear pattern of worsening renal function.

## 2013-04-03 NOTE — Telephone Encounter (Signed)
Medication refill-provera

## 2013-04-16 ENCOUNTER — Other Ambulatory Visit: Payer: Self-pay | Admitting: Internal Medicine

## 2013-04-16 DIAGNOSIS — E11359 Type 2 diabetes mellitus with proliferative diabetic retinopathy without macular edema: Secondary | ICD-10-CM

## 2013-04-16 DIAGNOSIS — R739 Hyperglycemia, unspecified: Secondary | ICD-10-CM

## 2013-04-16 DIAGNOSIS — D649 Anemia, unspecified: Secondary | ICD-10-CM

## 2013-04-16 DIAGNOSIS — Z8669 Personal history of other diseases of the nervous system and sense organs: Secondary | ICD-10-CM

## 2013-04-16 DIAGNOSIS — E133599 Other specified diabetes mellitus with proliferative diabetic retinopathy without macular edema, unspecified eye: Secondary | ICD-10-CM

## 2013-04-16 DIAGNOSIS — K219 Gastro-esophageal reflux disease without esophagitis: Secondary | ICD-10-CM

## 2013-04-16 DIAGNOSIS — E1159 Type 2 diabetes mellitus with other circulatory complications: Secondary | ICD-10-CM

## 2013-04-16 DIAGNOSIS — G4733 Obstructive sleep apnea (adult) (pediatric): Secondary | ICD-10-CM

## 2013-04-16 DIAGNOSIS — I5032 Chronic diastolic (congestive) heart failure: Secondary | ICD-10-CM

## 2013-04-16 MED ORDER — GLUCOSE BLOOD VI STRP: ORAL_STRIP | Status: DC

## 2013-05-14 ENCOUNTER — Telehealth: Payer: Self-pay | Admitting: Emergency Medicine

## 2013-06-19 ENCOUNTER — Other Ambulatory Visit: Payer: Self-pay | Admitting: Cardiovascular Disease

## 2013-06-23 ENCOUNTER — Other Ambulatory Visit: Payer: Self-pay | Admitting: Cardiovascular Disease

## 2013-06-25 ENCOUNTER — Other Ambulatory Visit: Payer: Self-pay | Admitting: Emergency Medicine

## 2013-06-25 ENCOUNTER — Other Ambulatory Visit: Payer: Self-pay | Admitting: *Deleted

## 2013-06-25 MED ORDER — MEDROXYPROGESTERONE ACETATE 10 MG PO TABS: 10.0000 mg | ORAL_TABLET | Freq: Every day | ORAL | Status: DC

## 2013-06-25 MED ORDER — FUROSEMIDE 40 MG PO TABS: 40.0000 mg | ORAL_TABLET | Freq: Two times a day (BID) | ORAL | Status: DC

## 2013-06-30 ENCOUNTER — Other Ambulatory Visit: Payer: Self-pay | Admitting: Cardiovascular Disease

## 2013-07-02 ENCOUNTER — Ambulatory Visit: Payer: Medicare Other | Admitting: Cardiovascular Disease

## 2013-07-03 ENCOUNTER — Other Ambulatory Visit: Payer: Self-pay | Admitting: Cardiovascular Disease

## 2013-07-04 ENCOUNTER — Other Ambulatory Visit: Payer: Self-pay | Admitting: Cardiovascular Disease

## 2013-07-20 ENCOUNTER — Other Ambulatory Visit: Payer: Self-pay | Admitting: Cardiovascular Disease

## 2013-07-25 ENCOUNTER — Other Ambulatory Visit: Payer: Self-pay | Admitting: Cardiovascular Disease

## 2013-08-10 ENCOUNTER — Ambulatory Visit (INDEPENDENT_AMBULATORY_CARE_PROVIDER_SITE_OTHER): Payer: Medicare Other | Admitting: Cardiovascular Disease

## 2013-08-10 ENCOUNTER — Encounter: Payer: Self-pay | Admitting: Cardiovascular Disease

## 2013-08-10 VITALS — BP 142/70 | HR 77 | Resp 20 | Ht 63.0 in | Wt 269.1 lb

## 2013-08-10 DIAGNOSIS — I5032 Chronic diastolic (congestive) heart failure: Secondary | ICD-10-CM

## 2013-08-10 DIAGNOSIS — R9431 Abnormal electrocardiogram [ECG] [EKG]: Secondary | ICD-10-CM | POA: Insufficient documentation

## 2013-08-10 DIAGNOSIS — E1165 Type 2 diabetes mellitus with hyperglycemia: Secondary | ICD-10-CM

## 2013-08-10 DIAGNOSIS — IMO0001 Reserved for inherently not codable concepts without codable children: Secondary | ICD-10-CM

## 2013-08-10 DIAGNOSIS — Z79899 Other long term (current) drug therapy: Secondary | ICD-10-CM

## 2013-08-10 DIAGNOSIS — IMO0002 Reserved for concepts with insufficient information to code with codable children: Secondary | ICD-10-CM

## 2013-08-10 DIAGNOSIS — I2510 Atherosclerotic heart disease of native coronary artery without angina pectoris: Secondary | ICD-10-CM

## 2013-08-10 DIAGNOSIS — E785 Hyperlipidemia, unspecified: Secondary | ICD-10-CM

## 2013-08-10 LAB — BASIC METABOLIC PANEL
BUN: 17 mg/dL (ref 6–23)
CHLORIDE: 107 meq/L (ref 96–112)
CO2: 19 mEq/L (ref 19–32)
Calcium: 9.1 mg/dL (ref 8.4–10.5)
Creat: 1.45 mg/dL — ABNORMAL HIGH (ref 0.50–1.10)
GLUCOSE: 274 mg/dL — AB (ref 70–99)
Potassium: 3.9 mEq/L (ref 3.5–5.3)
SODIUM: 138 meq/L (ref 135–145)

## 2013-08-10 MED ORDER — NITROGLYCERIN 0.4 MG SL SUBL: 0.4000 mg | SUBLINGUAL_TABLET | SUBLINGUAL | Status: DC | PRN

## 2013-08-10 NOTE — Assessment & Plan Note (Signed)
Assessment of her volemic status is very difficult secondary to her obesity. I suspect that she may be at least mildly hypervolemic, but am reluctant to increase her diuretic secondary to her advanced kidney disease and what may be hypokalemic and judging by her electrocardiogram. The addition of Acetazolamide could well have further increased potassium excretion from her loop diuretic. We'll get her last lab results from her nephrologist but may need to repeat a basic metabolic panel

## 2013-08-10 NOTE — Assessment & Plan Note (Signed)
Excellent total and LDL cholesterol levels on current treatment. Her HDL is low but is unlikely to improve barring major reduction in her weight.

## 2013-08-10 NOTE — Patient Instructions (Signed)
Your physician recommends that you have lab work done today at [facility] on the first floor.  Your physician recommends that you schedule a follow-up appointment in: 6 months

## 2013-08-10 NOTE — Assessment & Plan Note (Signed)
This is relatively mild and may be associated with Ranexa therapy. Need to make sure that she is not hypokalemic

## 2013-08-10 NOTE — Assessment & Plan Note (Signed)
Because of the diffuse disease she is not a good candidate for either percutaneous or surgical revascularization. Currently she does not have any angina pectoris despite the fact that she is no longer receiving long-acting nitrates. If angina recurs I would restart isosorbide mononitrate. Should not increase the dose of Ranexa any further because of her renal dysfunction

## 2013-08-10 NOTE — Assessment & Plan Note (Signed)
The most recent assessment of glycemic control is from 5 months ago and was very mediocre with an A1c of 9.4%. She has advanced retinopathy, nephropathy and neuropathy and aggressive glycemic control is important.

## 2013-08-10 NOTE — Progress Notes (Addendum)
Patient ID: [name], female   DOB: Jan 21, 1956, 58 y.o.   MRN: [id number]     Reason for office visit Followup CAD, diastolic heart failure, obstructive sleep apnea  [name] is a 58 year old woman with diabetes mellitus and extensive diffuse coronary disease felt to be is best suited for medical therapy. Her distal targets for coronary bypass were felt to be very poor. She has generally done well with medical therapy without any major events. She has type 2 diabetes mellitus complicated by retinopathy, unilateral blindness and also has systemic hypertension and chronic diastolic heart failure and obstructive sleep apnea.   [name] is doing well from a cardiac standpoint. Surprisingly, she has not had any angina whatsoever since her last appointment. She has had an occasional sharp precordial chest pain, but her usual angina is interscapular and described as a heaviness. She continues to have significant shortness of breath NYHA functional class III. She cannot climb a flight of stairs without stopping to catch her breath. Edema has not been a big problem.  She has been started on acetazolamide PO by her ophthalmologist and she states that this has severely curbed her appetite.   She is compliant with CPAP for sleep apnea. She has not required increases in her usual dose of loop diuretic. She has chronic relatively mild calf and ankle edema.   Allergies  Allergen Reactions  . Lorazepam Anaphylaxis  . Orange Fruit [Citrus] Anaphylaxis    Pt stated throat swelling, itching    Current Outpatient Prescriptions  Medication Sig Dispense Refill  . acetaminophen (TYLENOL) 500 MG tablet Take 500 mg by mouth every 6 (six) hours as needed. For pain      . acetaZOLAMIDE (DIAMOX) 500 MG capsule       . aspirin 81 MG tablet Take 81 mg by mouth daily.      . bisacodyl (DULCOLAX) 5 MG EC tablet Take 5 mg by mouth daily as needed. For constipation      . Blood Glucose Monitoring Suppl  (FREESTYLE LITE) DEVI 1 Device by Does not apply route as directed. Use glucose meter to test blood glucose 4 times per day. Pt is on insulin.  Dx 250.02  1 each  1  . Blood Glucose Monitoring Suppl W/DEVICE KIT 1 each by Does not apply route 3 (three) times daily.  1 each  0  . brimonidine-timolol (COMBIGAN) 0.2-0.5 % ophthalmic solution Place 1 drop into both eyes every 12 (twelve) hours.      Marland Kitchen diltiazem (DILACOR XR) 240 MG 24 hr capsule Take 1 capsule (240 mg total) by mouth daily.  90 capsule  3  . docusate sodium (COLACE) 50 MG capsule Take 100 mg by mouth 2 (two) times daily as needed for constipation.      Marland Kitchen ezetimibe-simvastatin (VYTORIN) 10-40 MG per tablet Take 1 tablet by mouth daily.  90 tablet  3  . ferrous sulfate 325 (65 FE) MG tablet Take 325 mg by mouth daily with breakfast.      . furosemide (LASIX) 40 MG tablet Take 1 tablet (40 mg total) by mouth 2 (two) times daily.  60 tablet  9  . glucose blood (FREESTYLE LITE) test strip Use as instructed  100 each  12  . glucose monitoring kit (FREESTYLE) monitoring kit 1 each by Does not apply route as needed for other.  1 each  2  . hydrALAZINE (APRESOLINE) 50 MG tablet Take 1 tablet (50 mg total) by mouth 2 (two) times  daily.  60 tablet  6  . insulin lispro (HUMALOG) 100 UNIT/ML injection Inject 65 Units into the skin 2 (two) times daily.  10 mL  3  . insulin lispro protamine-lispro (HUMALOG MIX 75/25) (75-25) 100 UNIT/ML SUSP injection Inject 65 Units into the skin 2 (two) times daily with a meal.  40 mL  5  . Lancets (FREESTYLE) lancets Use as instructed  100 each  12  . losartan-hydrochlorothiazide (HYZAAR) 100-25 MG per tablet Take 1 tablet by mouth daily.  90 tablet  3  . medroxyPROGESTERone (PROVERA) 10 MG tablet Take 10 mg by mouth daily.      . metoprolol (TOPROL-XL) 200 MG 24 hr tablet Take 1 tablet (200 mg total) by mouth daily.  90 tablet  3  . nitroGLYCERIN (NITROSTAT) 0.4 MG SL tablet Place 0.4 mg under the tongue every 5  (five) minutes as needed. For chest pain      . ondansetron (ZOFRAN ODT) 4 MG disintegrating tablet Take 1 tablet (4 mg total) by mouth every 8 (eight) hours as needed for nausea.  10 tablet  0  . pantoprazole (PROTONIX) 40 MG tablet Take 40 mg by mouth daily.      . polyethylene glycol powder (GLYCOLAX/MIRALAX) powder Take 17 g by mouth daily.      . polyvinyl alcohol (LIQUIFILM TEARS) 1.4 % ophthalmic solution Place 1 drop into both eyes daily as needed. For dry eyes      . potassium chloride (K-DUR,KLOR-CON) 10 MEQ tablet Take 1 tablet (10 mEq total) by mouth daily.  90 tablet  3  . ranolazine (RANEXA) 500 MG 12 hr tablet Take 1 tablet (500 mg total) by mouth 2 (two) times daily.  60 tablet  6  . RELION INSULIN SYR 1CC/30G 30G X 5/16" 1 ML MISC        No current facility-administered medications for this visit.    Past Medical History  Diagnosis Date  . Diabetes mellitus   . Hypertension   . Hyperlipemia   . Coronary artery disease     Medical Rx  . GERD (gastroesophageal reflux disease)   . Morbid obesity with BMI of 40.0-44.9, adult   . Anemia   . Obstructive sleep apnea     no sleep study  . Diabetic retinopathy   . Chronic diastolic heart failure 5/73    grade 1 by echo, EF 55-60%    Past Surgical History  Procedure Laterality Date  . Cardiac catheterization  July 2010  . Incision and drainage perirectal abscess  04/03/2012  . Groin abscess removed      left  . Partial hysterectomy  1981    Family History  Problem Relation Age of Onset  . Colon polyps Father   . Diabetes Mother   . Diabetes Father   . Diabetes Brother   . Diabetes Sister   . Heart disease Father   . Heart disease Brother   . Heart disease Sister     History   Social History  . Marital Status: Married    Spouse Name: N/A    Number of Children: 0  . Years of Education: N/A   Occupational History  . Not on file.   Social History Main Topics  . Smoking status: Never Smoker   . Smokeless  tobacco: Never Used  . Alcohol Use: No  . Drug Use: No  . Sexual Activity: Not on file   Other Topics Concern  . Not on file   Social  History Narrative  . No narrative on file    Review of systems: The patient specifically denies any chest pain at rest, dyspnea at rest, orthopnea, paroxysmal nocturnal dyspnea, syncope, palpitations, focal neurological deficits, intermittent claudication,unexplained weight gain, cough, hemoptysis or wheezing.  The patient also denies abdominal pain, nausea, vomiting, dysphagia, diarrhea, constipation, polyuria, polydipsia, dysuria, hematuria, frequency, urgency, abnormal bleeding or bruising, fever, chills, unexpected weight changes, mood swings, change in skin or hair texture, change in voice quality, auditory problems, allergic reactions or rashes, new musculoskeletal complaints other than usual "aches and pains". Her vision has deteriorated and she has problems with retinal detachment and glaucoma   PHYSICAL EXAM BP 142/70  Pulse 77  Resp 20  Ht _0  (1.6 m)  Wt 269 lb 1.6 oz (122.063 kg)  BMI 47.68 kg/m2 Morbidly obese this makes for a very difficult and rather incomplete physical exam  General: Alert, oriented x3, no distress  Head: no evidence of trauma, PERRL, EOMI, no exophtalmos or lid lag, no myxedema, no xanthelasma; normal ears, nose and oropharynx  Neck: normal jugular venous pulsations and no hepatojugular reflux; brisk carotid pulses without delay and no carotid bruits  Chest: clear to auscultation, no signs of consolidation by percussion or palpation, normal fremitus, symmetrical and full respiratory excursions  Cardiovascular: Impossible to define the apical impulse, regular rhythm, normal first and widely split second heart sounds, no murmurs, rubs or gallops  Abdomen: no tenderness or distention, no masses by palpation, no abnormal pulsatility or arterial bruits, normal bowel sounds, no hepatosplenomegaly  Extremities: no clubbing,  cyanosis and 2+ lateral ankle edema; 2+ radial, ulnar and brachial pulses bilaterally; 2+ right femoral, posterior tibial and dorsalis pedis pulses; 2+ left femoral, posterior tibial and dorsalis pedis pulses; no subclavian or femoral bruits  Neurological: grossly nonfocal   EKG: Sinus rhythm. Nonspecific flattening of T waves in virtually all leads with slight T-wave inversion in leads V2 and V3. ST segment changes are less prominent  Lipid Panel     Component Value Date/Time   CHOL 86 03/14/2013 1015   TRIG 92 03/14/2013 1015   HDL 29* 03/14/2013 1015   CHOLHDL 3.0 03/14/2013 1015   VLDL 18 03/14/2013 1015   LDLCALC 39 03/14/2013 1015    BMET    Component Value Date/Time   NA 136 03/22/2013 1159   K 3.1* 03/22/2013 1159   CL 102 03/22/2013 1159   CO2 21 03/22/2013 1159   GLUCOSE 196* 03/22/2013 1159   BUN 27* 03/22/2013 1159   CREATININE 1.74* 03/22/2013 1159   CREATININE 1.51* 04/18/2012 1307   CALCIUM 9.2 03/22/2013 1159   GFRNONAA 37* 04/18/2012 1307   GFRAA 43* 04/18/2012 1307     ASSESSMENT AND PLAN Chronic diastolic heart failure, NYHA class 3 Assessment of her volemic status is very difficult secondary to her obesity. I suspect that she may be at least mildly hypervolemic, but am reluctant to increase her diuretic secondary to her advanced kidney disease and what may be hypokalemic and judging by her electrocardiogram. The addition of Acetazolamide could well have further increased potassium excretion from her loop diuretic. We'll get her last lab results from her nephrologist but may need to repeat a basic metabolic panel  CAD, cath 2010, turned down for CABG "poor targets" Because of the diffuse disease she is not a good candidate for either percutaneous or surgical revascularization. Currently she does not have any angina pectoris despite the fact that she is no longer receiving long-acting nitrates. If  angina recurs I would restart isosorbide mononitrate. Should not increase the  dose of Ranexa any further because of her renal dysfunction  Prolonged Q-T interval on ECG This is relatively mild and may be associated with Ranexa therapy. Need to make sure that she is not hypokalemic  Dyslipidemia Excellent total and LDL cholesterol levels on current treatment. Her HDL is low but is unlikely to improve barring major reduction in her weight.  Diabetes mellitus type 2, uncontrolled The most recent assessment of glycemic control is from 5 months ago and was very mediocre with an A1c of 9.4%. She has advanced retinopathy, nephropathy and neuropathy and aggressive glycemic control is important.   potassium was 3.8 in October before addition of Diamox. Repeat a metabolic panel Orders Placed This Encounter  Procedures  . EKG 12-Lead   Meds ordered this encounter  Medications  . medroxyPROGESTERone (PROVERA) 10 MG tablet    Sig: Take 10 mg by mouth daily.    Holli Humbles, MD, [place] [phone] office [phone] pager

## 2013-08-21 ENCOUNTER — Telehealth: Payer: Self-pay | Admitting: *Deleted

## 2013-08-21 MED ORDER — EZETIMIBE-SIMVASTATIN 10-20 MG PO TABS: 1.0000 | ORAL_TABLET | Freq: Every day | ORAL | Status: DC

## 2013-08-21 NOTE — Telephone Encounter (Signed)
vytorin decreased to 10/20mg  qd due to a drug interaction at the higher dose w/Ranexa.  New Rx sent to pharmacy.  Patient notified and voiced understanding.

## 2013-10-29 ENCOUNTER — Other Ambulatory Visit: Payer: Self-pay | Admitting: Cardiovascular Disease

## 2013-11-05 ENCOUNTER — Telehealth: Payer: Self-pay | Admitting: Internal Medicine

## 2013-11-05 ENCOUNTER — Other Ambulatory Visit: Payer: Self-pay

## 2013-11-05 DIAGNOSIS — G4733 Obstructive sleep apnea (adult) (pediatric): Secondary | ICD-10-CM

## 2013-11-05 DIAGNOSIS — K219 Gastro-esophageal reflux disease without esophagitis: Secondary | ICD-10-CM

## 2013-11-05 DIAGNOSIS — R739 Hyperglycemia, unspecified: Secondary | ICD-10-CM

## 2013-11-05 DIAGNOSIS — E11359 Type 2 diabetes mellitus with proliferative diabetic retinopathy without macular edema: Secondary | ICD-10-CM

## 2013-11-05 DIAGNOSIS — Z8669 Personal history of other diseases of the nervous system and sense organs: Secondary | ICD-10-CM

## 2013-11-05 DIAGNOSIS — E1159 Type 2 diabetes mellitus with other circulatory complications: Secondary | ICD-10-CM

## 2013-11-05 DIAGNOSIS — D649 Anemia, unspecified: Secondary | ICD-10-CM

## 2013-11-05 DIAGNOSIS — E133599 Other specified diabetes mellitus with proliferative diabetic retinopathy without macular edema, unspecified eye: Secondary | ICD-10-CM

## 2013-11-05 DIAGNOSIS — I5032 Chronic diastolic (congestive) heart failure: Secondary | ICD-10-CM

## 2013-11-05 MED ORDER — INSULIN LISPRO PROT & LISPRO (75-25 MIX) 100 UNIT/ML ~~LOC~~ SUSP: 65.0000 [IU] | Freq: Two times a day (BID) | SUBCUTANEOUS | Status: DC

## 2013-11-05 NOTE — Telephone Encounter (Signed)
Pt has appt scheduled in May but is low on insulin. Please f/u with pt regarding script.

## 2013-11-07 ENCOUNTER — Telehealth: Payer: Self-pay

## 2013-11-07 NOTE — Telephone Encounter (Signed)
Returned patient phone call Unable to leave message Voice mail box not set up

## 2013-12-18 ENCOUNTER — Ambulatory Visit: Payer: Medicare Other | Attending: Internal Medicine | Admitting: Internal Medicine

## 2013-12-18 ENCOUNTER — Encounter: Payer: Self-pay | Admitting: Internal Medicine

## 2013-12-18 VITALS — BP 114/74 | HR 68 | Temp 98.8°F | Resp 16 | Wt 263.0 lb

## 2013-12-18 DIAGNOSIS — E119 Type 2 diabetes mellitus without complications: Secondary | ICD-10-CM

## 2013-12-18 DIAGNOSIS — I2510 Atherosclerotic heart disease of native coronary artery without angina pectoris: Secondary | ICD-10-CM | POA: Diagnosis not present

## 2013-12-18 DIAGNOSIS — Z794 Long term (current) use of insulin: Secondary | ICD-10-CM | POA: Diagnosis not present

## 2013-12-18 DIAGNOSIS — Z0000 Encounter for general adult medical examination without abnormal findings: Secondary | ICD-10-CM | POA: Diagnosis present

## 2013-12-18 DIAGNOSIS — I10 Essential (primary) hypertension: Secondary | ICD-10-CM | POA: Diagnosis not present

## 2013-12-18 DIAGNOSIS — E669 Obesity, unspecified: Secondary | ICD-10-CM | POA: Insufficient documentation

## 2013-12-18 DIAGNOSIS — Z7982 Long term (current) use of aspirin: Secondary | ICD-10-CM | POA: Diagnosis not present

## 2013-12-18 DIAGNOSIS — Z79899 Other long term (current) drug therapy: Secondary | ICD-10-CM | POA: Diagnosis not present

## 2013-12-18 DIAGNOSIS — K219 Gastro-esophageal reflux disease without esophagitis: Secondary | ICD-10-CM | POA: Diagnosis not present

## 2013-12-18 DIAGNOSIS — E785 Hyperlipidemia, unspecified: Secondary | ICD-10-CM | POA: Diagnosis not present

## 2013-12-18 LAB — GLUCOSE, POCT (MANUAL RESULT ENTRY): POC Glucose: 212 mg/dl — AB (ref 70–99)

## 2013-12-18 LAB — POCT GLYCOSYLATED HEMOGLOBIN (HGB A1C): HEMOGLOBIN A1C: 8.5

## 2013-12-18 MED ORDER — BISACODYL 5 MG PO TBEC: 5.0000 mg | DELAYED_RELEASE_TABLET | Freq: Every day | ORAL | Status: DC | PRN

## 2013-12-18 MED ORDER — "INSULIN SYRINGE-NEEDLE U-100 30G X 5/16"" 1 ML MISC": Status: DC

## 2013-12-18 NOTE — Progress Notes (Signed)
MRN: [id number] Name: [name]  Sex: female Age: 58 y.o. DOB: 08/18/1955  Allergies: Lorazepam and Orange fruit  Chief Complaint  Patient presents with  . Follow-up    HPI: Patient is 58 y.o. female who has to of diabetes hypertension CAD comes today for followup, she denies any hypoglycemic symptoms her hemoglobin A1c is trending down, she is taking Humalog 65 units twice a day as per patient she has a glucometer but does not know how to use it, she also following up with her nephrologist as well as cardiologist, denies any acute symptoms.   Past Medical History  Diagnosis Date  . Diabetes mellitus   . Hypertension   . Hyperlipemia   . Coronary artery disease     Medical Rx  . GERD (gastroesophageal reflux disease)   . Morbid obesity with BMI of 40.0-44.9, adult   . Anemia   . Obstructive sleep apnea     no sleep study  . Diabetic retinopathy   . Chronic diastolic heart failure 1/55    grade 1 by echo, EF 55-60%    Past Surgical History  Procedure Laterality Date  . Cardiac catheterization  July 2010  . Incision and drainage perirectal abscess  04/03/2012  . Groin abscess removed      left  . Partial hysterectomy  1981      Medication List       This list is accurate as of: 12/18/13 11:20 AM.  Always use your most recent med list.               acetaminophen 500 MG tablet  Commonly known as:  TYLENOL  Take 500 mg by mouth every 6 (six) hours as needed. For pain     acetaZOLAMIDE 500 MG capsule  Commonly known as:  DIAMOX     aspirin 81 MG tablet  Take 81 mg by mouth daily.     bisacodyl 5 MG EC tablet  Commonly known as:  DULCOLAX  Take 5 mg by mouth daily as needed. For constipation     Blood Glucose Monitoring Suppl W/DEVICE Kit  1 each by Does not apply route 3 (three) times daily.     FREESTYLE LITE Devi  1 Device by Does not apply route as directed. Use glucose meter to test blood glucose 4 times per day. Pt is on insulin.  Dx 250.02      glucose monitoring kit monitoring kit  1 each by Does not apply route as needed for other.     COMBIGAN 0.2-0.5 % ophthalmic solution  Generic drug:  brimonidine-timolol  Place 1 drop into both eyes every 12 (twelve) hours.     diltiazem 240 MG 24 hr capsule  Commonly known as:  DILACOR XR  Take 1 capsule (240 mg total) by mouth daily.     docusate sodium 50 MG capsule  Commonly known as:  COLACE  Take 100 mg by mouth 2 (two) times daily as needed for constipation.     ezetimibe-simvastatin 10-20 MG per tablet  Commonly known as:  VYTORIN  Take 1 tablet by mouth daily.     ferrous sulfate 325 (65 FE) MG tablet  Take 325 mg by mouth daily with breakfast.     freestyle lancets  Use as instructed     furosemide 40 MG tablet  Commonly known as:  LASIX  Take 1 tablet (40 mg total) by mouth 2 (two) times daily.     glucose blood test  strip  Commonly known as:  FREESTYLE LITE  Use as instructed     hydrALAZINE 50 MG tablet  Commonly known as:  APRESOLINE  Take 1 tablet (50 mg total) by mouth 2 (two) times daily.     insulin lispro 100 UNIT/ML injection  Commonly known as:  HUMALOG  Inject 65 Units into the skin 2 (two) times daily.     insulin lispro protamine-lispro (75-25) 100 UNIT/ML Susp injection  Commonly known as:  HUMALOG MIX 75/25  Inject 65 Units into the skin 2 (two) times daily with a meal.     losartan-hydrochlorothiazide 100-25 MG per tablet  Commonly known as:  HYZAAR  Take 1 tablet by mouth daily.     medroxyPROGESTERone 10 MG tablet  Commonly known as:  PROVERA  Take 10 mg by mouth daily.     metoprolol 200 MG 24 hr tablet  Commonly known as:  TOPROL-XL  Take 1 tablet (200 mg total) by mouth daily.     nitroGLYCERIN 0.4 MG SL tablet  Commonly known as:  NITROSTAT  Place 1 tablet (0.4 mg total) under the tongue every 5 (five) minutes as needed. For chest pain     ondansetron 4 MG disintegrating tablet  Commonly known as:  ZOFRAN ODT  Take  1 tablet (4 mg total) by mouth every 8 (eight) hours as needed for nausea.     pantoprazole 40 MG tablet  Commonly known as:  PROTONIX  Take 40 mg by mouth daily.     polyethylene glycol powder powder  Commonly known as:  GLYCOLAX/MIRALAX  Take 17 g by mouth daily.     polyvinyl alcohol 1.4 % ophthalmic solution  Commonly known as:  LIQUIFILM TEARS  Place 1 drop into both eyes daily as needed. For dry eyes     potassium chloride 10 MEQ tablet  Commonly known as:  K-DUR,KLOR-CON  Take 1 tablet (10 mEq total) by mouth daily.     RANEXA 500 MG 12 hr tablet  Generic drug:  ranolazine  TAKE ONE TABLET BY MOUTH TWICE DAILY     RELION INSULIN SYR 1CC/30G 30G X 5/16" 1 ML Misc  Generic drug:  Insulin Syringe-Needle U-100        No orders of the defined types were placed in this encounter.    Immunization History  Administered Date(s) Administered  . Influenza Whole 05/22/2009  . Pneumococcal Polysaccharide-23 05/22/2009  . Td 08/02/2002    Family History  Problem Relation Age of Onset  . Colon polyps Father   . Diabetes Mother   . Diabetes Father   . Diabetes Brother   . Diabetes Sister   . Heart disease Father   . Heart disease Brother   . Heart disease Sister     History  Substance Use Topics  . Smoking status: Never Smoker   . Smokeless tobacco: Never Used  . Alcohol Use: No    Review of Systems   As noted in HPI  Filed Vitals:   12/18/13 1048  BP: 114/74  Pulse: 68  Temp: 98.8 F (37.1 C)  Resp: 16    Physical Exam  Physical Exam  Constitutional:  Obese female not in acute distress   Cardiovascular: Normal rate and regular rhythm.   Pulmonary/Chest: No respiratory distress. She has no wheezes. She has no rales.  Musculoskeletal:  1+ pedal edema     CBC    Component Value Date/Time   WBC 10.2 03/14/2013 1015   RBC 4.95 03/14/2013  1015   RBC  Value: 2.49 CORRECTED ON 07/09 AT 0624: PREVIOUSLY REPORTED AS 2.26* 02/06/2010 0100   HGB 15.4*  03/14/2013 1015   HCT 46.7* 03/14/2013 1015   PLT 216 03/14/2013 1015   MCV 94.3 03/14/2013 1015   LYMPHSABS 1.8 04/18/2012 1307   MONOABS 0.5 04/18/2012 1307   EOSABS 0.0 04/18/2012 1307   BASOSABS 0.0 04/18/2012 1307    CMP     Component Value Date/Time   NA 138 08/10/2013 0935   K 3.9 08/10/2013 0935   CL 107 08/10/2013 0935   CO2 19 08/10/2013 0935   GLUCOSE 274* 08/10/2013 0935   BUN 17 08/10/2013 0935   CREATININE 1.45* 08/10/2013 0935   CREATININE 1.51* 04/18/2012 1307   CALCIUM 9.1 08/10/2013 0935   PROT 7.3 03/22/2013 1159   ALBUMIN 3.6 03/22/2013 1159   AST 12 03/22/2013 1159   ALT 10 03/22/2013 1159   ALKPHOS 45 03/22/2013 1159   BILITOT 0.4 03/22/2013 1159   GFRNONAA 26* 03/14/2013 1015   GFRNONAA 37* 04/18/2012 1307   GFRAA 30* 03/14/2013 1015   GFRAA 43* 04/18/2012 1307    Lab Results  Component Value Date/Time   CHOL 86 03/14/2013 10:15 AM    No components found with this basename: hga1c    Lab Results  Component Value Date/Time   AST 12 03/22/2013 11:59 AM    Assessment and Plan  DM (diabetes mellitus) - Plan:  Results for orders placed in visit on 12/18/13  GLUCOSE, POCT (MANUAL RESULT ENTRY)      Result Value Ref Range   POC Glucose 212 (*) 70 - 99 mg/dl  POCT GLYCOSYLATED HEMOGLOBIN (HGB A1C)      Result Value Ref Range   Hemoglobin A1C 8.5      I have advised patient to increase the insulin to 67 Units bid if fasting sugar is >150 mg/dl she can increase her insulin by one unit, the goal is blood sugar between 90-120 mg a deciliter, nurse has done the teaching regarding how to use glucometer  Essential hypertension, benign Blood pressure is well-controlled continued current medications  CAD (coronary artery disease) Currently following up with the cardiologist denies any acute symptoms.  Return in about 3 months (around 03/20/2014) for diabetes.  Lorayne Marek, MD

## 2013-12-18 NOTE — Progress Notes (Signed)
Patient here for follow up on her DM 

## 2013-12-18 NOTE — Patient Instructions (Addendum)
Diabetes Meal Planning Guide The diabetes meal planning guide is a tool to help you plan your meals and snacks. It is important for people with diabetes to manage their blood glucose (sugar) levels. Choosing the right foods and the right amounts throughout your day will help control your blood glucose. Eating right can even help you improve your blood pressure and reach or maintain a healthy weight. CARBOHYDRATE COUNTING MADE EASY When you eat carbohydrates, they turn to sugar. This raises your blood glucose level. Counting carbohydrates can help you control this level so you feel better. When you plan your meals by counting carbohydrates, you can have more flexibility in what you eat and balance your medicine with your food intake. Carbohydrate counting simply means adding up the total amount of carbohydrate grams in your meals and snacks. Try to eat about the same amount at each meal. Foods with carbohydrates are listed below. Each portion below is 1 carbohydrate serving or 15 grams of carbohydrates. Ask your dietician how many grams of carbohydrates you should eat at each meal or snack. Grains and Starches  1 slice bread.   English muffin or hotdog/hamburger bun.   cup cold cereal (unsweetened).   cup cooked pasta or rice.   cup starchy vegetables (corn, potatoes, peas, beans, winter squash).  1 tortilla (6 inches).   bagel.  1 waffle or pancake (size of a CD).   cup cooked cereal.  4 to 6 small crackers. *Whole grain is recommended. Fruit  1 cup fresh unsweetened berries, melon, papaya, pineapple.  1 small fresh fruit.   banana or mango.   cup fruit juice (4 oz unsweetened).   cup canned fruit in natural juice or water.  2 tbs dried fruit.  12 to 15 grapes or cherries. Milk and Yogurt  1 cup fat-free or 1% milk.  1 cup soy milk.  6 oz light yogurt with sugar-free sweetener.  6 oz low-fat soy yogurt.  6 oz plain yogurt. Vegetables  1 cup raw or  cup  cooked is counted as 0 carbohydrates or a "free" food.  If you eat 3 or more servings at 1 meal, count them as 1 carbohydrate serving. Other Carbohydrates   oz chips or pretzels.   cup ice cream or frozen yogurt.   cup sherbet or sorbet.  2 inch square cake, no frosting.  1 tbs honey, sugar, jam, jelly, or syrup.  2 small cookies.  3 squares of graham crackers.  3 cups popcorn.  6 crackers.  1 cup broth-based soup.  Count 1 cup casserole or other mixed foods as 2 carbohydrate servings.  Foods with less than 20 calories in a serving may be counted as 0 carbohydrates or a "free" food. You may want to purchase a book or computer software that lists the carbohydrate gram counts of different foods. In addition, the nutrition facts panel on the labels of the foods you eat are a good source of this information. The label will tell you how big the serving size is and the total number of carbohydrate grams you will be eating per serving. Divide this number by 15 to obtain the number of carbohydrate servings in a portion. Remember, 1 carbohydrate serving equals 15 grams of carbohydrate. SERVING SIZES Measuring foods and serving sizes helps you make sure you are getting the right amount of food. The list below tells how big or small some common serving sizes are.  1 oz.........4 stacked dice.  3 oz.........Deck of cards.  1 tsp........Tip   of little finger.  1 tbs........Thumb.  2 tbs........Golf ball.   cup.......Half of a fist.  1 cup........A fist. SAMPLE DIABETES MEAL PLAN Below is a sample meal plan that includes foods from the grain and starches, dairy, vegetable, fruit, and meat groups. A dietician can individualize a meal plan to fit your calorie needs and tell you the number of servings needed from each food group. However, controlling the total amount of carbohydrates in your meal or snack is more important than making sure you include all of the food groups at every  meal. You may interchange carbohydrate containing foods (dairy, starches, and fruits). The meal plan below is an example of a 2000 calorie diet using carbohydrate counting. This meal plan has 17 carbohydrate servings. Breakfast  1 cup oatmeal (2 carb servings).   cup light yogurt (1 carb serving).  1 cup blueberries (1 carb serving).   cup almonds. Snack  1 large apple (2 carb servings).  1 low-fat string cheese stick. Lunch  Chicken breast salad.  1 cup spinach.   cup chopped tomatoes.  2 oz chicken breast, sliced.  2 tbs low-fat Italian dressing.  12 whole-wheat crackers (2 carb servings).  12 to 15 grapes (1 carb serving).  1 cup low-fat milk (1 carb serving). Snack  1 cup carrots.   cup hummus (1 carb serving). Dinner  3 oz broiled salmon.  1 cup brown rice (3 carb servings). Snack  1  cups steamed broccoli (1 carb serving) drizzled with 1 tsp olive oil and lemon juice.  1 cup light pudding (2 carb servings). DIABETES MEAL PLANNING WORKSHEET Your dietician can use this worksheet to help you decide how many servings of foods and what types of foods are right for you.  BREAKFAST Food Group and Servings / Carb Servings Grain/Starches __________________________________ Dairy __________________________________________ Vegetable ______________________________________ Fruit ___________________________________________ Meat __________________________________________ Fat ____________________________________________ LUNCH Food Group and Servings / Carb Servings Grain/Starches ___________________________________ Dairy ___________________________________________ Fruit ____________________________________________ Meat ___________________________________________ Fat _____________________________________________ DINNER Food Group and Servings / Carb Servings Grain/Starches ___________________________________ Dairy  ___________________________________________ Fruit ____________________________________________ Meat ___________________________________________ Fat _____________________________________________ SNACKS Food Group and Servings / Carb Servings Grain/Starches ___________________________________ Dairy ___________________________________________ Vegetable _______________________________________ Fruit ____________________________________________ Meat ___________________________________________ Fat _____________________________________________ DAILY TOTALS Starches _________________________ Vegetable ________________________ Fruit ____________________________ Dairy ____________________________ Meat ____________________________ Fat ______________________________ Document Released: 04/15/2005 Document Revised: 10/11/2011 Document Reviewed: 02/24/2009 Exit[employer] Patient Information 2014 [employer] DASH Diet The DASH diet stands for "Dietary Approaches to Stop Hypertension." It is a healthy eating plan that has been shown to reduce high blood pressure (hypertension) in as little as 14 days, while also possibly providing other significant health benefits. These other health benefits include reducing the risk of breast cancer after menopause and reducing the risk of type 2 diabetes, heart disease, colon cancer, and stroke. Health benefits also include weight loss and slowing kidney failure in patients with chronic kidney disease.  DIET GUIDELINES  Limit salt (sodium). Your diet should contain less than 1500 mg of sodium daily.  Limit refined or processed carbohydrates. Your diet should include mostly whole grains. Desserts and added sugars should be used sparingly.  Include small amounts of heart-healthy fats. These types of fats include nuts, oils, and tub margarine. Limit saturated and trans fats. These fats have been shown to be harmful in the body. CHOOSING FOODS  The following food groups  are based on a 2000 calorie diet. See your Registered Dietitian for individual calorie needs. Grains and Grain Products (6 to 8 servings daily)  Eat More Often:   Whole-wheat bread, brown rice, whole-grain or wheat pasta, quinoa, popcorn without added fat or salt (air popped).  Eat Less Often: White bread, white pasta, white rice, cornbread. Vegetables (4 to 5 servings daily)  Eat More Often: Fresh, frozen, and canned vegetables. Vegetables may be raw, steamed, roasted, or grilled with a minimal amount of fat.  Eat Less Often/Avoid: Creamed or fried vegetables. Vegetables in a cheese sauce. Fruit (4 to 5 servings daily)  Eat More Often: All fresh, canned (in natural juice), or frozen fruits. Dried fruits without added sugar. One hundred percent fruit juice ( cup [237 mL] daily).  Eat Less Often: Dried fruits with added sugar. Canned fruit in light or heavy syrup. Lean Meats, Fish, and Poultry (2 servings or less daily. One serving is 3 to 4 oz [85-114 g]).  Eat More Often: Ninety percent or leaner ground beef, tenderloin, sirloin. Round cuts of beef, chicken breast, turkey breast. All fish. Grill, bake, or broil your meat. Nothing should be fried.  Eat Less Often/Avoid: Fatty cuts of meat, turkey, or chicken leg, thigh, or wing. Fried cuts of meat or fish. Dairy (2 to 3 servings)  Eat More Often: Low-fat or fat-free milk, low-fat plain or light yogurt, reduced-fat or part-skim cheese.  Eat Less Often/Avoid: Milk (whole, 2%).Whole milk yogurt. Full-fat cheeses. Nuts, Seeds, and Legumes (4 to 5 servings per week)  Eat More Often: All without added salt.  Eat Less Often/Avoid: Salted nuts and seeds, canned beans with added salt. Fats and Sweets (limited)  Eat More Often: Vegetable oils, tub margarines without trans fats, sugar-free gelatin. Mayonnaise and salad dressings.  Eat Less Often/Avoid: Coconut oils, palm oils, butter, stick margarine, cream, half and half, cookies, candy,  pie. FOR MORE INFORMATION The Dash Diet Eating Plan: www.dashdiet.org Document Released: 07/08/2011 Document Revised: 10/11/2011 Document Reviewed: 07/08/2011 [employer] Patient Information 2014 [employer].  

## 2013-12-19 ENCOUNTER — Other Ambulatory Visit: Payer: Self-pay | Admitting: Cardiovascular Disease

## 2013-12-19 NOTE — Telephone Encounter (Signed)
Rx was sent to pharmacy electronically. 

## 2013-12-31 ENCOUNTER — Other Ambulatory Visit: Payer: Self-pay | Admitting: *Deleted

## 2013-12-31 MED ORDER — PANTOPRAZOLE SODIUM 40 MG PO TBEC: 40.0000 mg | DELAYED_RELEASE_TABLET | Freq: Every day | ORAL | Status: DC

## 2013-12-31 NOTE — Telephone Encounter (Signed)
Rx was sent to pharmacy electronically. 

## 2014-02-19 ENCOUNTER — Other Ambulatory Visit: Payer: Self-pay | Admitting: Cardiovascular Disease

## 2014-02-20 NOTE — Telephone Encounter (Signed)
Rx was sent to pharmacy electronically. 

## 2014-02-27 ENCOUNTER — Other Ambulatory Visit: Payer: Self-pay | Admitting: Cardiovascular Disease

## 2014-02-27 NOTE — Telephone Encounter (Signed)
Rx was sent to pharmacy electronically. 

## 2014-03-04 ENCOUNTER — Other Ambulatory Visit: Payer: Self-pay | Admitting: Cardiovascular Disease

## 2014-03-04 NOTE — Telephone Encounter (Signed)
Rx refill sent to patient pharmacy   

## 2014-03-22 ENCOUNTER — Encounter: Payer: Self-pay | Admitting: Cardiovascular Disease

## 2014-03-22 ENCOUNTER — Ambulatory Visit (INDEPENDENT_AMBULATORY_CARE_PROVIDER_SITE_OTHER): Payer: Medicare Other | Admitting: Cardiovascular Disease

## 2014-03-22 VITALS — BP 122/76 | HR 68 | Resp 16 | Ht 63.0 in | Wt 267.7 lb

## 2014-03-22 DIAGNOSIS — R9431 Abnormal electrocardiogram [ECG] [EKG]: Secondary | ICD-10-CM

## 2014-03-22 DIAGNOSIS — I5032 Chronic diastolic (congestive) heart failure: Secondary | ICD-10-CM

## 2014-03-22 DIAGNOSIS — Z79899 Other long term (current) drug therapy: Secondary | ICD-10-CM

## 2014-03-22 DIAGNOSIS — R609 Edema, unspecified: Secondary | ICD-10-CM

## 2014-03-22 DIAGNOSIS — IMO0001 Reserved for inherently not codable concepts without codable children: Secondary | ICD-10-CM

## 2014-03-22 DIAGNOSIS — IMO0002 Reserved for concepts with insufficient information to code with codable children: Secondary | ICD-10-CM

## 2014-03-22 DIAGNOSIS — E782 Mixed hyperlipidemia: Secondary | ICD-10-CM

## 2014-03-22 DIAGNOSIS — I2510 Atherosclerotic heart disease of native coronary artery without angina pectoris: Secondary | ICD-10-CM

## 2014-03-22 DIAGNOSIS — I10 Essential (primary) hypertension: Secondary | ICD-10-CM

## 2014-03-22 DIAGNOSIS — E1165 Type 2 diabetes mellitus with hyperglycemia: Secondary | ICD-10-CM

## 2014-03-22 DIAGNOSIS — E669 Obesity, unspecified: Secondary | ICD-10-CM

## 2014-03-22 MED ORDER — POTASSIUM CHLORIDE CRYS ER 10 MEQ PO TBCR: 30.0000 meq | EXTENDED_RELEASE_TABLET | Freq: Two times a day (BID) | ORAL | Status: DC

## 2014-03-22 MED ORDER — FUROSEMIDE 40 MG PO TABS: ORAL_TABLET | ORAL | Status: DC

## 2014-03-22 NOTE — Patient Instructions (Signed)
Increase Furosemide to 80mg  in the morning and 40mg  in the afternoon.  Increase Potassium 30meq to 3 tablet twice a day.  Your physician recommends that you return for lab work in: 7-10 days at [facility].  Dr. Sallyanne Kuster recommends that you schedule a follow-up appointment in:

## 2014-03-22 NOTE — Progress Notes (Signed)
Patient ID: [name], female   DOB: May 17, 1956, 58 y.o.   MRN: [id number]      Reason for office visit Followup CAD, diastolic heart failure, obstructive sleep apnea   [name] is a 58 year old woman with diabetes mellitus and extensive diffuse coronary disease felt to be is best suited for medical therapy. Her distal targets for coronary bypass were felt to be very poor. She has generally done well with medical therapy without any major events. She has type 2 diabetes mellitus complicated by retinopathy,  unilateral blindness, nephropathy (CKD 3) and also has systemic hypertension and chronic diastolic heart failure, morbid obesity and obstructive sleep apnea. Most recent EF by echo was 55-60% in September 2013.  [name] has not had problems with angina. She is taking nitroglycerin sublingual only once in the last 2 months. She continues to have exertional dyspnea functional class 2-3 and recently has developed marked swelling in her ankles, which is a new problem since her last appointment earlier this year. He reports compliance with CPAP for obstructive sleep apnea. She complains of occasional dizziness, but has not had syncope or palpitations. Her weight is essentially unchanged from her last appointment in January.   Allergies  Allergen Reactions  . Lorazepam Anaphylaxis  . Orange Fruit [Citrus] Anaphylaxis    Pt stated throat swelling, itching    Current Outpatient Prescriptions  Medication Sig Dispense Refill  . acetaminophen (TYLENOL) 500 MG tablet Take 500 mg by mouth every 6 (six) hours as needed. For pain      . acetaZOLAMIDE (DIAMOX) 500 MG capsule       . aspirin 81 MG tablet Take 81 mg by mouth daily.      . bisacodyl (DULCOLAX) 5 MG EC tablet Take 1 tablet (5 mg total) by mouth daily as needed. For constipation  30 tablet  2  . brimonidine-timolol (COMBIGAN) 0.2-0.5 % ophthalmic solution Place 1 drop into both eyes every 12 (twelve) hours.      Marland Kitchen diltiazem  (DILACOR XR) 240 MG 24 hr capsule TAKE ONE CAPSULE BY MOUTH ONCE DAILY  90 capsule  1  . docusate sodium (COLACE) 50 MG capsule Take 100 mg by mouth 2 (two) times daily as needed for constipation.      Marland Kitchen ezetimibe-simvastatin (VYTORIN) 10-20 MG per tablet Take 1 tablet by mouth daily.  90 tablet  3  . ferrous sulfate 325 (65 FE) MG tablet Take 325 mg by mouth daily with breakfast.      . furosemide (LASIX) 40 MG tablet Take 1 tablet (40 mg total) by mouth 2 (two) times daily.  60 tablet  9  . glucose blood (FREESTYLE LITE) test strip Use as instructed  100 each  12  . hydrALAZINE (APRESOLINE) 50 MG tablet Take 1 tablet (50 mg total) by mouth 2 (two) times daily.  60 tablet  6  . insulin lispro (HUMALOG) 100 UNIT/ML injection Inject 65 Units into the skin 2 (two) times daily.  10 mL  3  . insulin lispro protamine-lispro (HUMALOG MIX 75/25) (75-25) 100 UNIT/ML SUSP injection Inject 65 Units into the skin 2 (two) times daily with a meal.  40 mL  5  . Insulin Syringe-Needle U-100 (RELION INSULIN SYR 1CC/30G) 30G X 5/16" 1 ML MISC Used as directed  100 each  3  . KLOR-CON M10 10 MEQ tablet TAKE ONE TABLET BY MOUTH ONCE DAILY  90 tablet  2  . losartan-hydrochlorothiazide (HYZAAR) 100-25 MG per tablet TAKE ONE TABLET BY  MOUTH ONCE DAILY  90 tablet  1  . LUMIGAN 0.01 % SOLN Place 1 drop into both eyes 2 (two) times daily.      . medroxyPROGESTERone (PROVERA) 10 MG tablet Take 10 mg by mouth daily.      . metoprolol (TOPROL-XL) 200 MG 24 hr tablet TAKE ONE  BY MOUTH ONCE DAILY  90 tablet  1  . nitroGLYCERIN (NITROSTAT) 0.4 MG SL tablet Place 1 tablet (0.4 mg total) under the tongue every 5 (five) minutes as needed. For chest pain  25 tablet  11  . pantoprazole (PROTONIX) 40 MG tablet Take 1 tablet (40 mg total) by mouth daily.  30 tablet  6  . polyethylene glycol powder (GLYCOLAX/MIRALAX) powder Take 17 g by mouth daily.      . polyvinyl alcohol (LIQUIFILM TEARS) 1.4 % ophthalmic solution Place 1 drop into  both eyes daily as needed. For dry eyes      . RANEXA 500 MG 12 hr tablet TAKE ONE TABLET BY MOUTH TWICE DAILY  60 tablet  9  . RESTASIS 0.05 % ophthalmic emulsion Place 1 drop into both eyes 2 (two) times daily.       No current facility-administered medications for this visit.    Past Medical History  Diagnosis Date  . Diabetes mellitus   . Hypertension   . Hyperlipemia   . Coronary artery disease     Medical Rx  . GERD (gastroesophageal reflux disease)   . Morbid obesity with BMI of 40.0-44.9, adult   . Anemia   . Obstructive sleep apnea     no sleep study  . Diabetic retinopathy   . Chronic diastolic heart failure [id number]    grade 1 by echo, EF 55-60%    Past Surgical History  Procedure Laterality Date  . Cardiac catheterization  July 2010  . Incision and drainage perirectal abscess  04/03/2012  . Groin abscess removed      left  . Partial hysterectomy  1981    Family History  Problem Relation Age of Onset  . Colon polyps Father   . Diabetes Mother   . Diabetes Father   . Diabetes Brother   . Diabetes Sister   . Heart disease Father   . Heart disease Brother   . Heart disease Sister     History   Social History  . Marital Status: Married    Spouse Name: N/A    Number of Children: 0  . Years of Education: N/A   Occupational History  . Not on file.   Social History Main Topics  . Smoking status: Never Smoker   . Smokeless tobacco: Never Used  . Alcohol Use: No  . Drug Use: No  . Sexual Activity: Not on file   Other Topics Concern  . Not on file   Social History Narrative  . No narrative on file    Review of systems: Exertional dyspnea, lower extremity edema, occasional dizziness, very rare angina pectoris The patient specifically denies any chest pain at rest, dyspnea at rest, orthopnea, paroxysmal nocturnal dyspnea, syncope, palpitations, focal neurological deficits, intermittent claudication, unexplained weight gain, cough, hemoptysis or  wheezing.  The patient also denies abdominal pain, nausea, vomiting, dysphagia, diarrhea, constipation, polyuria, polydipsia, dysuria, hematuria, frequency, urgency, abnormal bleeding or bruising, fever, chills, unexpected weight changes, mood swings, change in skin or hair texture, change in voice quality, auditory or visual problems, allergic reactions or rashes, new musculoskeletal complaints other than usual "aches and pains".  PHYSICAL EXAM BP 122/76  Pulse 68  Resp 16  Ht 5\' 3"  (1.6 m)  Wt 267 lb 11.2 oz (121.428 kg)  BMI 47.43 kg/m2 Morbidly obese: difficult physical exam  General: Alert, oriented x3, no distress  Head: no evidence of trauma, PERRL, EOMI, no exophtalmos or lid lag, no myxedema, no xanthelasma; normal ears, nose and oropharynx  Neck: normal jugular venous pulsations and no hepatojugular reflux; brisk carotid pulses without delay and no carotid bruits  Chest: clear to auscultation, no signs of consolidation by percussion or palpation, normal fremitus, symmetrical and full respiratory excursions  Cardiovascular: Impossible to define the apical impulse, regular rhythm, normal first and widely split second heart sounds, no murmurs, rubs or gallops  Abdomen: no tenderness or distention, no masses by palpation, no abnormal pulsatility or arterial bruits, normal bowel sounds, no hepatosplenomegaly  Extremities: no clubbing, cyanosis and 3+ bilateral ankle edema; 2+ radial, ulnar and brachial pulses bilaterally; 2+ right femoral, unable to locate posterior tibial and dorsalis pedis pulses; 2+ left femoral, unable to locate posterior tibial and dorsalis pedis pulses; no subclavian or femoral bruits  Neurological: grossly nonfocal   EKG: Sinus rhythm. Nonspecific flattening of T waves in virtually all leads with slight T-wave inversion in leads V2 and V3. No change from last tracing  Lipid Panel     Component Value Date/Time   CHOL 86 03/14/2013 1015   TRIG 92 03/14/2013 1015     HDL 29* 03/14/2013 1015   CHOLHDL 3.0 03/14/2013 1015   VLDL 18 03/14/2013 1015   LDLCALC 39 03/14/2013 1015    BMET    Component Value Date/Time   NA 138 08/10/2013 0935   K 3.9 08/10/2013 0935   CL 107 08/10/2013 0935   CO2 19 08/10/2013 0935   GLUCOSE 274* 08/10/2013 0935   BUN 17 08/10/2013 0935   CREATININE 1.45* 08/10/2013 0935   CREATININE 1.51* 04/18/2012 1307   CALCIUM 9.1 08/10/2013 0935   GFRNONAA 26* 03/14/2013 1015   GFRNONAA 37* 04/18/2012 1307   GFRAA 30* 03/14/2013 1015   GFRAA 43* 04/18/2012 1307     ASSESSMENT AND PLAN  Chronic diastolic heart failure, NYHA class 3  Today she has clear evidence of hypervolemia and I recommended that she increase her furosemide to 80 mg in the morning and 40 mg in the afternoon and also increase her potassium chloride to a total of 60 mEq daily. Repeat metabolic panel in one week to reassess creatinine and potassium CKD stage 3 CAD, cath 2010, turned down for CABG "poor targets"  Because of the diffuse disease she is not a good candidate for either percutaneous or surgical revascularization. Currently she has excellent control of her angina pectoris. If angina recurs I would restart isosorbide mononitrate.  Should not increase the dose of Ranexa any further because of her renal dysfunction  Prolonged Q-T interval on ECG  No longer seen  Dyslipidemia  Excellent total and LDL cholesterol levels on current treatment. Her HDL is low but is unlikely to improve barring major reduction in her weight.  HTN Excellent blood pressure control Diabetes mellitus type 2, uncontrolled  Glycemic control remains mediocre per her report. She has advanced retinopathy, nephropathy and neuropathy and aggressive glycemic control is important.  Meds ordered this encounter  Medications  . LUMIGAN 0.01 % SOLN    Sig: Place 1 drop into both eyes 2 (two) times daily.  . RESTASIS 0.05 % ophthalmic emulsion    Sig: Place 1 drop into both  eyes 2 (two) times daily.     Holli Humbles, MD, [place] [phone] office [phone] pager

## 2014-04-03 LAB — COMPREHENSIVE METABOLIC PANEL WITH GFR
ALT: 9 U/L (ref 0–35)
AST: 11 U/L (ref 0–37)
Albumin: 3.7 g/dL (ref 3.5–5.2)
Alkaline Phosphatase: 51 U/L (ref 39–117)
BUN: 14 mg/dL (ref 6–23)
CO2: 21 meq/L (ref 19–32)
Calcium: 9.1 mg/dL (ref 8.4–10.5)
Chloride: 107 meq/L (ref 96–112)
Creat: 1.39 mg/dL — ABNORMAL HIGH (ref 0.50–1.10)
Glucose, Bld: 174 mg/dL — ABNORMAL HIGH (ref 70–99)
Potassium: 4 meq/L (ref 3.5–5.3)
Sodium: 137 meq/L (ref 135–145)
Total Bilirubin: 0.3 mg/dL (ref 0.2–1.2)
Total Protein: 7.5 g/dL (ref 6.0–8.3)

## 2014-04-03 LAB — LIPID PANEL
Cholesterol: 104 mg/dL (ref 0–200)
HDL: 36 mg/dL — ABNORMAL LOW (ref >39)
LDL CALC: 46 mg/dL (ref 0–99)
TRIGLYCERIDES: 108 mg/dL (ref <150)
Total CHOL/HDL Ratio: 2.9 Ratio
VLDL: 22 mg/dL (ref 0–40)

## 2014-05-28 ENCOUNTER — Other Ambulatory Visit: Payer: Self-pay | Admitting: Cardiovascular Disease

## 2014-05-28 NOTE — Telephone Encounter (Signed)
Refilled #90 tablets with 3 refills on 08/21/2013

## 2014-05-29 ENCOUNTER — Other Ambulatory Visit: Payer: Self-pay | Admitting: Cardiovascular Disease

## 2014-07-10 ENCOUNTER — Telehealth: Payer: Self-pay | Admitting: Internal Medicine

## 2014-07-10 NOTE — Telephone Encounter (Signed)
Pt.'s husband is calling because he has some concerns in regards to his wife's health, pt's husband states that she has been having muscles spasms for more than 2 weeks and would like to speak to nurse.

## 2014-07-19 ENCOUNTER — Encounter: Payer: Self-pay | Admitting: Internal Medicine

## 2014-07-19 ENCOUNTER — Ambulatory Visit: Payer: Medicare Other | Attending: Internal Medicine | Admitting: Internal Medicine

## 2014-07-19 VITALS — BP 130/80 | HR 62 | Temp 98.1°F | Resp 16 | Ht 63.0 in | Wt 259.0 lb

## 2014-07-19 DIAGNOSIS — I129 Hypertensive chronic kidney disease with stage 1 through stage 4 chronic kidney disease, or unspecified chronic kidney disease: Secondary | ICD-10-CM | POA: Insufficient documentation

## 2014-07-19 DIAGNOSIS — E785 Hyperlipidemia, unspecified: Secondary | ICD-10-CM | POA: Diagnosis not present

## 2014-07-19 DIAGNOSIS — Z23 Encounter for immunization: Secondary | ICD-10-CM | POA: Diagnosis not present

## 2014-07-19 DIAGNOSIS — M62838 Other muscle spasm: Secondary | ICD-10-CM | POA: Diagnosis not present

## 2014-07-19 DIAGNOSIS — I5032 Chronic diastolic (congestive) heart failure: Secondary | ICD-10-CM | POA: Diagnosis not present

## 2014-07-19 DIAGNOSIS — Z794 Long term (current) use of insulin: Secondary | ICD-10-CM | POA: Diagnosis not present

## 2014-07-19 DIAGNOSIS — E119 Type 2 diabetes mellitus without complications: Secondary | ICD-10-CM | POA: Diagnosis not present

## 2014-07-19 DIAGNOSIS — Z139 Encounter for screening, unspecified: Secondary | ICD-10-CM

## 2014-07-19 DIAGNOSIS — I2510 Atherosclerotic heart disease of native coronary artery without angina pectoris: Secondary | ICD-10-CM | POA: Insufficient documentation

## 2014-07-19 DIAGNOSIS — Z79899 Other long term (current) drug therapy: Secondary | ICD-10-CM | POA: Insufficient documentation

## 2014-07-19 DIAGNOSIS — R600 Localized edema: Secondary | ICD-10-CM | POA: Diagnosis present

## 2014-07-19 DIAGNOSIS — I10 Essential (primary) hypertension: Secondary | ICD-10-CM

## 2014-07-19 DIAGNOSIS — N289 Disorder of kidney and ureter, unspecified: Secondary | ICD-10-CM

## 2014-07-19 DIAGNOSIS — N189 Chronic kidney disease, unspecified: Secondary | ICD-10-CM | POA: Insufficient documentation

## 2014-07-19 LAB — POCT GLYCOSYLATED HEMOGLOBIN (HGB A1C): Hemoglobin A1C: 7

## 2014-07-19 LAB — GLUCOSE, POCT (MANUAL RESULT ENTRY): POC GLUCOSE: 154 mg/dL — AB (ref 70–99)

## 2014-07-19 MED ORDER — FREESTYLE SYSTEM KIT: 1.0000 | PACK | Freq: Three times a day (TID) | Status: DC

## 2014-07-19 MED ORDER — BACLOFEN 10 MG PO TABS: 10.0000 mg | ORAL_TABLET | Freq: Every evening | ORAL | Status: DC | PRN

## 2014-07-19 NOTE — Progress Notes (Signed)
Pt is here following up on her HTN, diabetes and hyperlipidemia. Pt states that her left side of her body is swollen and painful.

## 2014-07-19 NOTE — Progress Notes (Signed)
MRN: [id number] Name: [name]  Sex: female Age: 58 y.o. DOB: May 18, 1956  Allergies: Lorazepam and Orange fruit  Chief Complaint  Patient presents with  . Follow-up    HPI: Patient is 58 y.o. female who history of hypertension diabetes, CAD CK D.comes today for followup her she has chronic lower extremity edema and has been taking Lasix twice a day and also on potassium supplement, denies any fever chills, she has been compliant with her medications for diabetes she is taking insulin 65 units twice a day, denies any hypoglycemic symptoms, her hemoglobin A1c has improved. She's complaining of leg cramps especially the left leg her at night.as per patient she also followed up with her kidney doctor.  Past Medical History  Diagnosis Date  . Diabetes mellitus   . Hypertension   . Hyperlipemia   . Coronary artery disease     Medical Rx  . GERD (gastroesophageal reflux disease)   . Morbid obesity with BMI of 40.0-44.9, adult   . Anemia   . Obstructive sleep apnea     no sleep study  . Diabetic retinopathy   . Chronic diastolic heart failure 5/97    grade 1 by echo, EF 55-60%    Past Surgical History  Procedure Laterality Date  . Cardiac catheterization  July 2010  . Incision and drainage perirectal abscess  04/03/2012  . Groin abscess removed      left  . Partial hysterectomy  1981      Medication List       This list is accurate as of: 07/19/14 10:22 AM.  Always use your most recent med list.               acetaminophen 500 MG tablet  Commonly known as:  TYLENOL  Take 500 mg by mouth every 6 (six) hours as needed. For pain     acetaZOLAMIDE 500 MG capsule  Commonly known as:  DIAMOX     aspirin 81 MG tablet  Take 81 mg by mouth daily.     baclofen 10 MG tablet  Commonly known as:  LIORESAL  Take 1 tablet (10 mg total) by mouth at bedtime as needed for muscle spasms.     bisacodyl 5 MG EC tablet  Commonly known as:  DULCOLAX  Take 1 tablet (5 mg  total) by mouth daily as needed. For constipation     bismuth subsalicylate 416 LA/45XM suspension  Commonly known as:  PEPTO BISMOL  Take 30 mLs by mouth every 6 (six) hours as needed.     COMBIGAN 0.2-0.5 % ophthalmic solution  Generic drug:  brimonidine-timolol  Place 1 drop into both eyes every 12 (twelve) hours.     dextromethorphan 15 MG/5ML syrup  Take 10 mLs by mouth 4 (four) times daily as needed for cough.     diltiazem 240 MG 24 hr capsule  Commonly known as:  DILACOR XR  TAKE ONE CAPSULE BY MOUTH ONCE DAILY     docusate sodium 50 MG capsule  Commonly known as:  COLACE  Take 100 mg by mouth 2 (two) times daily as needed for constipation.     ezetimibe-simvastatin 10-20 MG per tablet  Commonly known as:  VYTORIN  Take 1 tablet by mouth daily.     ferrous sulfate 325 (65 FE) MG tablet  Take 325 mg by mouth daily with breakfast.     furosemide 40 MG tablet  Commonly known as:  LASIX  Take 2 tablets in  the morning and 1 tablet in the afternoon.     glucose blood test strip  Commonly known as:  FREESTYLE LITE  Use as instructed     glucose monitoring kit monitoring kit  1 each by Does not apply route 4 (four) times daily - after meals and at bedtime. 1 month Diabetic Testing Supplies for QAC-QHS accuchecks.     hydrALAZINE 50 MG tablet  Commonly known as:  APRESOLINE  Take 1 tablet (50 mg total) by mouth 2 (two) times daily.     insulin lispro 100 UNIT/ML injection  Commonly known as:  HUMALOG  Inject 65 Units into the skin 2 (two) times daily.     insulin lispro protamine-lispro (75-25) 100 UNIT/ML Susp injection  Commonly known as:  HUMALOG MIX 75/25  Inject 65 Units into the skin 2 (two) times daily with a meal.     Insulin Syringe-Needle U-100 30G X 5/16" 1 ML Misc  Commonly known as:  RELION INSULIN SYR 1CC/30G  Used as directed     losartan-hydrochlorothiazide 100-25 MG per tablet  Commonly known as:  HYZAAR  TAKE ONE TABLET BY MOUTH ONCE DAILY       LUMIGAN 0.01 % Soln  Generic drug:  bimatoprost  Place 1 drop into both eyes 2 (two) times daily.     medroxyPROGESTERone 10 MG tablet  Commonly known as:  PROVERA  Take 10 mg by mouth daily.     metoprolol 200 MG 24 hr tablet  Commonly known as:  TOPROL-XL  TAKE ONE  BY MOUTH ONCE DAILY     nitroGLYCERIN 0.4 MG SL tablet  Commonly known as:  NITROSTAT  Place 1 tablet (0.4 mg total) under the tongue every 5 (five) minutes as needed. For chest pain     pantoprazole 40 MG tablet  Commonly known as:  PROTONIX  Take 1 tablet (40 mg total) by mouth daily.     polyethylene glycol powder powder  Commonly known as:  GLYCOLAX/MIRALAX  Take 17 g by mouth daily.     polyvinyl alcohol 1.4 % ophthalmic solution  Commonly known as:  LIQUIFILM TEARS  Place 1 drop into both eyes daily as needed. For dry eyes     potassium chloride 10 MEQ tablet  Commonly known as:  KLOR-CON M10  Take 3 tablets (30 mEq total) by mouth 2 (two) times daily.     RANEXA 500 MG 12 hr tablet  Generic drug:  ranolazine  TAKE ONE TABLET BY MOUTH TWICE DAILY     RESTASIS 0.05 % ophthalmic emulsion  Generic drug:  cycloSPORINE  Place 1 drop into both eyes 2 (two) times daily.        Meds ordered this encounter  Medications  . dextromethorphan 15 MG/5ML syrup    Sig: Take 10 mLs by mouth 4 (four) times daily as needed for cough.  . bismuth subsalicylate (PEPTO BISMOL) 262 MG/15ML suspension    Sig: Take 30 mLs by mouth every 6 (six) hours as needed.  Marland Kitchen glucose monitoring kit (FREESTYLE) monitoring kit    Sig: 1 each by Does not apply route 4 (four) times daily - after meals and at bedtime. 1 month Diabetic Testing Supplies for QAC-QHS accuchecks.    Dispense:  1 each    Refill:  1  . baclofen (LIORESAL) 10 MG tablet    Sig: Take 1 tablet (10 mg total) by mouth at bedtime as needed for muscle spasms.    Dispense:  30 each  Refill:  2    Immunization History  Administered Date(s) Administered  .  Influenza Whole 05/22/2009  . Pneumococcal Polysaccharide-23 05/22/2009  . Td 08/02/2002    Family History  Problem Relation Age of Onset  . Colon polyps Father   . Diabetes Mother   . Diabetes Father   . Diabetes Brother   . Diabetes Sister   . Heart disease Father   . Heart disease Brother   . Heart disease Sister     History  Substance Use Topics  . Smoking status: Never Smoker   . Smokeless tobacco: Never Used  . Alcohol Use: No    Review of Systems   As noted in HPI  Filed Vitals:   07/19/14 0959  BP: 130/80  Pulse:   Temp:   Resp:     Physical Exam  Physical Exam  Constitutional: No distress.  Eyes: EOM are normal. Pupils are equal, round, and reactive to light.  Cardiovascular: Normal rate and regular rhythm.   Pulmonary/Chest: Breath sounds normal. No respiratory distress. She has no wheezes. She has no rales.  Musculoskeletal:  Left leg no erythema, warmth  or tenderness 1+ pedal edema     CBC    Component Value Date/Time   WBC 10.2 03/14/2013 1015   RBC 4.95 03/14/2013 1015   RBC * 02/06/2010 0100    2.49 CORRECTED ON 07/09 AT 0624: PREVIOUSLY REPORTED AS 2.26   HGB 15.4* 03/14/2013 1015   HCT 46.7* 03/14/2013 1015   PLT 216 03/14/2013 1015   MCV 94.3 03/14/2013 1015   LYMPHSABS 1.8 04/18/2012 1307   MONOABS 0.5 04/18/2012 1307   EOSABS 0.0 04/18/2012 1307   BASOSABS 0.0 04/18/2012 1307    CMP     Component Value Date/Time   NA 137 04/03/2014 0958   K 4.0 04/03/2014 0958   CL 107 04/03/2014 0958   CO2 21 04/03/2014 0958   GLUCOSE 174* 04/03/2014 0958   BUN 14 04/03/2014 0958   CREATININE 1.39* 04/03/2014 0958   CREATININE 1.51* 04/18/2012 1307   CALCIUM 9.1 04/03/2014 0958   PROT 7.5 04/03/2014 0958   ALBUMIN 3.7 04/03/2014 0958   AST 11 04/03/2014 0958   ALT 9 04/03/2014 0958   ALKPHOS 51 04/03/2014 0958   BILITOT 0.3 04/03/2014 0958   GFRNONAA 26* 03/14/2013 1015   GFRNONAA 37* 04/18/2012 1307   GFRAA 30* 03/14/2013  1015   GFRAA 43* 04/18/2012 1307    Lab Results  Component Value Date/Time   CHOL 104 04/03/2014 09:58 AM    No components found for: HGA1C  Lab Results  Component Value Date/Time   AST 11 04/03/2014 09:58 AM    Assessment and Plan  Type 2 diabetes mellitus without complication - Plan:  Results for orders placed or performed in visit on 07/19/14  Glucose (CBG)  Result Value Ref Range   POC Glucose 154 (A) 70 - 99 mg/dl  HgB A1c  Result Value Ref Range   Hemoglobin A1C 7.0    Hemoglobin A1c is improving, advised patient to continue with current dose of insulin also advise for diabetes in., glucose monitoring kit (FREESTYLE) monitoring kit  Essential hypertension, benign Manual blood pressure is 130/80, she'll continue with current meds, also advise for DASH diet.  Chronic diastolic heart failure, NYHA class 3 Currently on Lasix twice a day, aspirin, statin, metoprolol, losartan, following up with cardiology symptoms are stable  Renal insufficiency Following up with nephrology.  Screening - Plan: Ambulatory referral to Gynecology  Muscle spasm of left lower extremity - Trial of Plan: baclofen (LIORESAL) 10 MG tablet   Health Maintenance  -Pap Smear: referred to GYN Flu shot given today.  Return in about 3 months (around 10/18/2014).  Lorayne Marek, MD

## 2014-07-19 NOTE — Patient Instructions (Signed)
DASH Eating Plan DASH stands for "Dietary Approaches to Stop Hypertension." The DASH eating plan is a healthy eating plan that has been shown to reduce high blood pressure (hypertension). Additional health benefits may include reducing the risk of type 2 diabetes mellitus, heart disease, and stroke. The DASH eating plan may also help with weight loss. WHAT DO I NEED TO KNOW ABOUT THE DASH EATING PLAN? For the DASH eating plan, you will follow these general guidelines:  Choose foods with a percent daily value for sodium of less than 5% (as listed on the food label).  Use salt-free seasonings or herbs instead of table salt or sea salt.  Check with your health care provider or pharmacist before using salt substitutes.  Eat lower-sodium products, often labeled as "lower sodium" or "no salt added."  Eat fresh foods.  Eat more vegetables, fruits, and low-fat dairy products.  Choose whole grains. Look for the word "whole" as the first word in the ingredient list.  Choose fish and skinless chicken or turkey more often than red meat. Limit fish, poultry, and meat to 6 oz (170 g) each day.  Limit sweets, desserts, sugars, and sugary drinks.  Choose heart-healthy fats.  Limit cheese to 1 oz (28 g) per day.  Eat more home-cooked food and less restaurant, buffet, and fast food.  Limit fried foods.  Cook foods using methods other than frying.  Limit canned vegetables. If you do use them, rinse them well to decrease the sodium.  When eating at a restaurant, ask that your food be prepared with less salt, or no salt if possible. WHAT FOODS CAN I EAT? Seek help from a dietitian for individual calorie needs. Grains Whole grain or whole wheat bread. Brown rice. Whole grain or whole wheat pasta. Quinoa, bulgur, and whole grain cereals. Low-sodium cereals. Corn or whole wheat flour tortillas. Whole grain cornbread. Whole grain crackers. Low-sodium crackers. Vegetables Fresh or frozen vegetables  (raw, steamed, roasted, or grilled). Low-sodium or reduced-sodium tomato and vegetable juices. Low-sodium or reduced-sodium tomato sauce and paste. Low-sodium or reduced-sodium canned vegetables.  Fruits All fresh, canned (in natural juice), or frozen fruits. Meat and Other Protein Products Ground beef (85% or leaner), grass-fed beef, or beef trimmed of fat. Skinless chicken or turkey. Ground chicken or turkey. Pork trimmed of fat. All fish and seafood. Eggs. Dried beans, peas, or lentils. Unsalted nuts and seeds. Unsalted canned beans. Dairy Low-fat dairy products, such as skim or 1% milk, 2% or reduced-fat cheeses, low-fat ricotta or cottage cheese, or plain low-fat yogurt. Low-sodium or reduced-sodium cheeses. Fats and Oils Tub margarines without trans fats. Light or reduced-fat mayonnaise and salad dressings (reduced sodium). Avocado. Safflower, olive, or canola oils. Natural peanut or almond butter. Other Unsalted popcorn and pretzels. The items listed above may not be a complete list of recommended foods or beverages. Contact your dietitian for more options. WHAT FOODS ARE NOT RECOMMENDED? Grains White bread. White pasta. White rice. Refined cornbread. Bagels and croissants. Crackers that contain trans fat. Vegetables Creamed or fried vegetables. Vegetables in a cheese sauce. Regular canned vegetables. Regular canned tomato sauce and paste. Regular tomato and vegetable juices. Fruits Dried fruits. Canned fruit in light or heavy syrup. Fruit juice. Meat and Other Protein Products Fatty cuts of meat. Ribs, chicken wings, bacon, sausage, bologna, salami, chitterlings, fatback, hot dogs, bratwurst, and packaged luncheon meats. Salted nuts and seeds. Canned beans with salt. Dairy Whole or 2% milk, cream, half-and-half, and cream cheese. Whole-fat or sweetened yogurt. Full-fat   cheeses or blue cheese. Nondairy creamers and whipped toppings. Processed cheese, cheese spreads, or cheese  curds. Condiments Onion and garlic salt, seasoned salt, table salt, and sea salt. Canned and packaged gravies. Worcestershire sauce. Tartar sauce. Barbecue sauce. Teriyaki sauce. Soy sauce, including reduced sodium. Steak sauce. Fish sauce. Oyster sauce. Cocktail sauce. Horseradish. Ketchup and mustard. Meat flavorings and tenderizers. Bouillon cubes. Hot sauce. Tabasco sauce. Marinades. Taco seasonings. Relishes. Fats and Oils Butter, stick margarine, lard, shortening, ghee, and bacon fat. Coconut, palm kernel, or palm oils. Regular salad dressings. Other Pickles and olives. Salted popcorn and pretzels. The items listed above may not be a complete list of foods and beverages to avoid. Contact your dietitian for more information. WHERE CAN I FIND MORE INFORMATION? National Heart, Lung, and [employer] Institute: www.nhlbi.nih.gov/health/health-topics/topics/dash/ Document Released: 07/08/2011 Document Revised: 12/03/2013 Document Reviewed: 05/23/2013 Exit[employer] Patient Information 2015 [employer]. This information is not intended to replace advice given to you by your health care provider. Make sure you discuss any questions you have with your health care provider. Diabetes Mellitus and Food It is important for you to manage your blood sugar (glucose) level. Your blood glucose level can be greatly affected by what you eat. Eating healthier foods in the appropriate amounts throughout the day at about the same time each day will help you control your blood glucose level. It can also help slow or prevent worsening of your diabetes mellitus. Healthy eating may even help you improve the level of your blood pressure and reach or maintain a healthy weight.  HOW CAN FOOD AFFECT ME? Carbohydrates Carbohydrates affect your blood glucose level more than any other type of food. Your dietitian will help you determine how many carbohydrates to eat at each meal and teach you how to count carbohydrates. Counting  carbohydrates is important to keep your blood glucose at a healthy level, especially if you are using insulin or taking certain medicines for diabetes mellitus. Alcohol Alcohol can cause sudden decreases in blood glucose (hypoglycemia), especially if you use insulin or take certain medicines for diabetes mellitus. Hypoglycemia can be a life-threatening condition. Symptoms of hypoglycemia (sleepiness, dizziness, and disorientation) are similar to symptoms of having too much alcohol.  If your health care provider has given you approval to drink alcohol, do so in moderation and use the following guidelines:  Women should not have more than one drink per day, and men should not have more than two drinks per day. One drink is equal to:  12 oz of beer.  5 oz of wine.  1 oz of hard liquor.  Do not drink on an empty stomach.  Keep yourself hydrated. Have water, diet soda, or unsweetened iced tea.  Regular soda, juice, and other mixers might contain a lot of carbohydrates and should be counted. WHAT FOODS ARE NOT RECOMMENDED? As you make food choices, it is important to remember that all foods are not the same. Some foods have fewer nutrients per serving than other foods, even though they might have the same number of calories or carbohydrates. It is difficult to get your body what it needs when you eat foods with fewer nutrients. Examples of foods that you should avoid that are high in calories and carbohydrates but low in nutrients include:  Trans fats (most processed foods list trans fats on the Nutrition Facts label).  Regular soda.  Juice.  Candy.  Sweets, such as cake, pie, doughnuts, and cookies.  Fried foods. WHAT FOODS CAN I EAT? Have nutrient-rich foods,   which will nourish your body and keep you healthy. The food you should eat also will depend on several factors, including:  The calories you need.  The medicines you take.  Your weight.  Your blood glucose level.  Your  blood pressure level.  Your cholesterol level. You also should eat a variety of foods, including:  Protein, such as meat, poultry, fish, tofu, nuts, and seeds (lean animal proteins are best).  Fruits.  Vegetables.  Dairy products, such as milk, cheese, and yogurt (low fat is best).  Breads, grains, pasta, cereal, rice, and beans.  Fats such as olive oil, trans fat-free margarine, canola oil, avocado, and olives. DOES EVERYONE WITH DIABETES MELLITUS HAVE THE SAME MEAL PLAN? Because every person with diabetes mellitus is different, there is not one meal plan that works for everyone. It is very important that you meet with a dietitian who will help you create a meal plan that is just right for you. Document Released: 04/15/2005 Document Revised: 07/24/2013 Document Reviewed: 06/15/2013 Exit[employer] Patient Information 2015 [employer]. This information is not intended to replace advice given to you by your health care provider. Make sure you discuss any questions you have with your health care provider.  

## 2014-07-24 ENCOUNTER — Other Ambulatory Visit: Payer: Self-pay | Admitting: Emergency Medicine

## 2014-07-30 ENCOUNTER — Other Ambulatory Visit: Payer: Self-pay | Admitting: Cardiovascular Disease

## 2014-07-30 NOTE — Telephone Encounter (Signed)
Rx(s) sent to pharmacy electronically.  

## 2014-08-27 ENCOUNTER — Other Ambulatory Visit: Payer: Self-pay | Admitting: *Deleted

## 2014-08-27 MED ORDER — RANOLAZINE ER 500 MG PO TB12: 500.0000 mg | ORAL_TABLET | Freq: Two times a day (BID) | ORAL | Status: DC

## 2014-08-27 MED ORDER — METOPROLOL SUCCINATE ER 200 MG PO TB24: 200.0000 mg | ORAL_TABLET | Freq: Every day | ORAL | Status: DC

## 2014-09-01 ENCOUNTER — Other Ambulatory Visit: Payer: Self-pay | Admitting: Internal Medicine

## 2014-09-01 ENCOUNTER — Other Ambulatory Visit: Payer: Self-pay | Admitting: Cardiovascular Disease

## 2014-09-02 NOTE — Telephone Encounter (Signed)
Rx refill sent to patient pharmacy   

## 2014-09-18 ENCOUNTER — Other Ambulatory Visit: Payer: Self-pay

## 2014-09-18 MED ORDER — "INSULIN SYRINGE-NEEDLE U-100 30G X 5/16"" 1 ML MISC": Status: DC

## 2014-09-26 ENCOUNTER — Other Ambulatory Visit: Payer: Self-pay | Admitting: Cardiovascular Disease

## 2014-09-26 NOTE — Telephone Encounter (Signed)
Rx has been sent to the pharmacy electronically. ° °

## 2014-10-18 ENCOUNTER — Ambulatory Visit (INDEPENDENT_AMBULATORY_CARE_PROVIDER_SITE_OTHER): Payer: Medicare Other | Admitting: Cardiovascular Disease

## 2014-10-18 ENCOUNTER — Encounter: Payer: Self-pay | Admitting: Cardiovascular Disease

## 2014-10-18 VITALS — BP 128/80 | HR 66 | Resp 16 | Ht 63.0 in | Wt 270.7 lb

## 2014-10-18 DIAGNOSIS — E669 Obesity, unspecified: Secondary | ICD-10-CM

## 2014-10-18 DIAGNOSIS — IMO0002 Reserved for concepts with insufficient information to code with codable children: Secondary | ICD-10-CM

## 2014-10-18 DIAGNOSIS — E785 Hyperlipidemia, unspecified: Secondary | ICD-10-CM

## 2014-10-18 DIAGNOSIS — Z79899 Other long term (current) drug therapy: Secondary | ICD-10-CM

## 2014-10-18 DIAGNOSIS — E1165 Type 2 diabetes mellitus with hyperglycemia: Secondary | ICD-10-CM

## 2014-10-18 DIAGNOSIS — R42 Dizziness and giddiness: Secondary | ICD-10-CM

## 2014-10-18 DIAGNOSIS — I5032 Chronic diastolic (congestive) heart failure: Secondary | ICD-10-CM

## 2014-10-18 DIAGNOSIS — I2510 Atherosclerotic heart disease of native coronary artery without angina pectoris: Secondary | ICD-10-CM

## 2014-10-18 NOTE — Patient Instructions (Signed)
Your physician wants you to follow-up in: 6 Months You will receive a reminder letter in the mail two months in advance. If you don't receive a letter, please call our office to schedule the follow-up appointment.  Your physician recommends that you return for lab work A1C, CMP and Fasting Lipids

## 2014-10-20 ENCOUNTER — Encounter: Payer: Self-pay | Admitting: Cardiovascular Disease

## 2014-10-20 NOTE — Progress Notes (Signed)
Patient ID: IMAAN PADGETT, female   DOB: [birth year], 59 y.o.   MRN: [id number]     Cardiology Office Note   Date:  10/20/2014   ID:  [name], [name] 01-Jun-1956, MRN [id number]  PCP:  Elizabeth Marek, MD  Cardiologist:   Elizabeth Klein, MD   Chief Complaint  Patient presents with  . Follow-up    6 months:  No chest pain or SOB.  Occas. edema.  States she stays dizzy.      History of Present Illness: [name] is a 59 y.o. female who presents for Followup CAD, diastolic heart failure, obstructive sleep apnea   Mrs. [name] is a 59 year old woman with diabetes mellitus and extensive diffuse coronary disease felt to be is best suited for medical therapy. Her distal targets for coronary bypass were felt to be very poor. She has generally done well with medical therapy without any major events and Ranexa substantially improved her angina burden. She has type 2 diabetes mellitus complicated by retinopathy, unilateral blindness, nephropathy (CKD 3) and also has systemic hypertension and chronic diastolic heart failure, morbid obesity and obstructive sleep apnea. Most recent EF by echo was 55-60% in September 2013.  Ma has not had problems with angina. She has taken nitroglycerin sublingual only once in the last 6 months. She continues to have exertional dyspnea functional class 2 , but has no swelling in her ankles. She is often dizzy. She reports compliance with CPAP for obstructive sleep apnea. She has not had syncope or palpitations. Her weight is similar to her last appointment, 3 lb less (she had edema at her last appointment). She takes furosemide prn, less than once a week.    Past Medical History  Diagnosis Date  . Diabetes mellitus   . Hypertension   . Hyperlipemia   . Coronary artery disease     Medical Rx  . GERD (gastroesophageal reflux disease)   . Morbid obesity with BMI of 40.0-44.9, adult   . Anemia   . Obstructive sleep apnea     no sleep study  .  Diabetic retinopathy   . Chronic diastolic heart failure 8/75    grade 1 by echo, EF 55-60%    Past Surgical History  Procedure Laterality Date  . Cardiac catheterization  July 2010  . Incision and drainage perirectal abscess  04/03/2012  . Groin abscess removed      left  . Partial hysterectomy  1981     Current Outpatient Prescriptions  Medication Sig Dispense Refill  . acetaminophen (TYLENOL) 500 MG tablet Take 500 mg by mouth every 6 (six) hours as needed. For pain    . acetaZOLAMIDE (DIAMOX) 500 MG capsule     . aspirin 81 MG tablet Take 81 mg by mouth daily.    . baclofen (LIORESAL) 10 MG tablet Take 1 tablet (10 mg total) by mouth at bedtime as needed for muscle spasms. 30 each 2  . bisacodyl (DULCOLAX) 5 MG EC tablet Take 1 tablet (5 mg total) by mouth daily as needed. For constipation 30 tablet 2  . bismuth subsalicylate (PEPTO BISMOL) 262 MG/15ML suspension Take 30 mLs by mouth every 6 (six) hours as needed.    . brimonidine-timolol (COMBIGAN) 0.2-0.5 % ophthalmic solution Place 1 drop into both eyes every 12 (twelve) hours.    Marland Kitchen dextromethorphan 15 MG/5ML syrup Take 10 mLs by mouth 4 (four) times daily as needed for cough.    . diltiazem (DILACOR XR) 240 MG 24 hr capsule  TAKE ONE CAPSULE BY MOUTH ONCE DAILY 90 capsule 1  . docusate sodium (COLACE) 50 MG capsule Take 100 mg by mouth 2 (two) times daily as needed for constipation.    . ferrous sulfate 325 (65 FE) MG tablet Take 325 mg by mouth daily with breakfast.    . furosemide (LASIX) 40 MG tablet Take 2 tablets in the morning and 1 tablet in the afternoon. 90 tablet 9  . glucose blood (FREESTYLE LITE) test strip Use as instructed 100 each 12  . glucose monitoring kit (FREESTYLE) monitoring kit 1 each by Does not apply route 4 (four) times daily - after meals and at bedtime. 1 month Diabetic Testing Supplies for QAC-QHS accuchecks. Test up to 4 times daily E11.9 1 each 1  . hydrALAZINE (APRESOLINE) 50 MG tablet Take 1  tablet (50 mg total) by mouth 2 (two) times daily. 60 tablet 6  . insulin lispro (HUMALOG) 100 UNIT/ML injection Inject 65 Units into the skin 2 (two) times daily. 10 mL 3  . insulin lispro protamine-lispro (HUMALOG MIX 75/25) (75-25) 100 UNIT/ML SUSP injection Inject 65 Units into the skin 2 (two) times daily with a meal. 40 mL 5  . Insulin Syringe-Needle U-100 (RELION INSULIN SYR 1CC/30G) 30G X 5/16" 1 ML MISC USE AS DIRECTED 100 each 0  . losartan-hydrochlorothiazide (HYZAAR) 100-25 MG per tablet TAKE ONE TABLET BY MOUTH ONCE DAILY 90 tablet 1  . LUMIGAN 0.01 % SOLN Place 1 drop into both eyes 2 (two) times daily.    . metoprolol (TOPROL-XL) 200 MG 24 hr tablet Take 1 tablet (200 mg total) by mouth daily. Need appointment before anymore refills 90 tablet 0  . nitroGLYCERIN (NITROSTAT) 0.4 MG SL tablet Place 1 tablet (0.4 mg total) under the tongue every 5 (five) minutes as needed. For chest pain 25 tablet 11  . pantoprazole (PROTONIX) 40 MG tablet TAKE ONE TABLET BY MOUTH ONCE DAILY 30 tablet 7  . polyvinyl alcohol (LIQUIFILM TEARS) 1.4 % ophthalmic solution Place 1 drop into both eyes daily as needed. For dry eyes    . potassium chloride (KLOR-CON M10) 10 MEQ tablet Take 3 tablets (30 mEq total) by mouth 2 (two) times daily. 180 tablet 9  . RANEXA 500 MG 12 hr tablet TAKE ONE TABLET BYMOUTH TWICE DAILY 60 tablet 1  . RESTASIS 0.05 % ophthalmic emulsion Place 1 drop into both eyes 2 (two) times daily.    Marland Kitchen VYTORIN 10-20 MG per tablet TAKE ONE TABLET BY MOUTH ONCE DAILY 90 tablet 1   No current facility-administered medications for this visit.    Allergies:   Lorazepam and Orange fruit    Social History:  The patient  reports that she has never smoked. She has never used smokeless tobacco. She reports that she does not drink alcohol or use illicit drugs.   Family History:  The patient's family history includes Colon polyps in her father; Diabetes in her brother, father, mother, and sister;  Heart disease in her brother, father, and sister.    ROS:  Please see the history of present illness.    Otherwise, review of systems positive for none.   All other systems are reviewed and negative.    PHYSICAL EXAM: VS:  BP 128/80 mmHg  Pulse 66  Resp 16  Ht _0  (1.6 m)  Wt 270 lb 11.2 oz (122.789 kg)  BMI 47.96 kg/m2  LMP 04/02/2012 , BMI Body mass index is 47.96 kg/(m^2).  General: Alert, oriented x3, no  distress Head: no evidence of trauma, PERRL, EOMI, no exophtalmos or lid lag, no myxedema, no xanthelasma; normal ears, nose and oropharynx Neck: normal jugular venous pulsations and no hepatojugular reflux; brisk carotid pulses without delay and no carotid bruits Chest: clear to auscultation, no signs of consolidation by percussion or palpation, normal fremitus, symmetrical and full respiratory excursions Cardiovascular: normal position and quality of the apical impulse, regular rhythm, normal first and second heart sounds, no murmurs, rubs or gallops Abdomen: no tenderness or distention, no masses by palpation, no abnormal pulsatility or arterial bruits, normal bowel sounds, no hepatosplenomegaly Extremities: no clubbing, cyanosis or edema; 2+ radial, ulnar and brachial pulses bilaterally; 2+ right femoral, posterior tibial and dorsalis pedis pulses; 2+ left femoral, posterior tibial and dorsalis pedis pulses; no subclavian or femoral bruits Neurological: grossly nonfocal Psych: euthymic mood, full affect   EKG:  EKG is ordered today. The ekg ordered today demonstrates NSR, incomplete RBBB, inferior T wave inversion, QTc 431 ms   Recent Labs: 04/03/2014: ALT 9; BUN 14; Creatinine 1.39*; Potassium 4.0; Sodium 137    Lipid Panel    Component Value Date/Time   CHOL 104 04/03/2014 0958   TRIG 108 04/03/2014 0958   HDL 36* 04/03/2014 0958   CHOLHDL 2.9 04/03/2014 0958   VLDL 22 04/03/2014 0958   LDLCALC 46 04/03/2014 0958      Wt Readings from Last 3 Encounters:    10/18/14 270 lb 11.2 oz (122.789 kg)  07/19/14 259 lb (117.482 kg)  03/22/14 267 lb 11.2 oz (121.428 kg)      ASSESSMENT AND PLAN:  1.  Chronic diastolic heart failure, NYHA class 3  She is doing well on prn furosemide only  2. CKD stage 3 Actually, latest creatinine was better (1.39)  3. CAD, cath 2010, turned down for CABG "poor targets"  Because of the diffuse disease she is not a good candidate for either percutaneous or surgical revascularization. Currently she has excellent control of her angina pectoris. If angina recurs I would restart isosorbide mononitrate.  Should not increase the dose of Ranexa any further because of her renal dysfunction    4. Dyslipidemia  Excellent total and LDL cholesterol levels on current treatment. Her HDL is low but is unlikely to improve barring major reduction in her weight.   5. HTN Excellent blood pressure control. Dizziness may be Ranexa related rather than BP related.  6.Diabetes mellitus type 2, controlled  Last A1c was 7%. She has advanced retinopathy, nephropathy and neuropathy and aggressive glycemic control is important.  Current medicines are reviewed at length with the patient today.  The patient does not have concerns regarding medicines.  The following changes have been made:  no change  Labs/ tests ordered today include:  Orders Placed This Encounter  Procedures  . Comp Met (CMET)  . HgB A1c  . Lipid Profile  . EKG 12-Lead    Patient Instructions  Your physician wants you to follow-up in: 6 Months You will receive a reminder letter in the mail two months in advance. If you don't receive a letter, please call our office to schedule the follow-up appointment.  Your physician recommends that you return for lab work A1C, CMP and Fasting Lipids      Signed, Elizabeth Klein, MD  10/20/2014 2:45 PM    Elizabeth Klein, MD, [facility] [phone] office [phone] pager

## 2014-11-04 ENCOUNTER — Telehealth: Payer: Self-pay | Admitting: Cardiovascular Disease

## 2014-11-04 NOTE — Telephone Encounter (Signed)
Pt wants to know when she can bring in some paper work in, that needs to be filled out for [employer]?

## 2014-11-04 NOTE — Telephone Encounter (Signed)
Patient advised she can bring paperwork at her convenience and should ask for medical records to review and get to either [facility] or Dr. C (whichever is most appropriate). The paperwork allows her to ride [employer] to and from medical appointments. She states Dr C has not filled this out for her before but the doctor who did previously has closed their office

## 2014-11-15 ENCOUNTER — Telehealth: Payer: Self-pay | Admitting: *Deleted

## 2014-11-15 LAB — LIPID PANEL
CHOL/HDL RATIO: 2.4 ratio
Cholesterol: 100 mg/dL (ref 0–200)
HDL: 41 mg/dL — ABNORMAL LOW (ref >=46)
LDL Cholesterol: 44 mg/dL (ref 0–99)
Triglycerides: 73 mg/dL (ref <150)
VLDL: 15 mg/dL (ref 0–40)

## 2014-11-15 LAB — COMPREHENSIVE METABOLIC PANEL
ALK PHOS: 52 U/L (ref 39–117)
ALT: 8 U/L (ref 0–35)
AST: 11 U/L (ref 0–37)
Albumin: 3.5 g/dL (ref 3.5–5.2)
BILIRUBIN TOTAL: 0.3 mg/dL (ref 0.2–1.2)
BUN: 15 mg/dL (ref 6–23)
CO2: 21 mEq/L (ref 19–32)
Calcium: 8.9 mg/dL (ref 8.4–10.5)
Chloride: 105 mEq/L (ref 96–112)
Creat: 1.18 mg/dL — ABNORMAL HIGH (ref 0.50–1.10)
Glucose, Bld: 134 mg/dL — ABNORMAL HIGH (ref 70–99)
Potassium: 4.4 mEq/L (ref 3.5–5.3)
Sodium: 136 mEq/L (ref 135–145)
Total Protein: 6.8 g/dL (ref 6.0–8.3)

## 2014-11-15 LAB — HEMOGLOBIN A1C
HEMOGLOBIN A1C: 7.9 % — AB (ref <5.7)
MEAN PLASMA GLUCOSE: 180 mg/dL — AB (ref <117)

## 2014-11-15 NOTE — Telephone Encounter (Signed)
Notified that the [employer] application has been filled out and signed for her transportation assist.  Requested it be mailed to her home.  Done.

## 2014-11-27 ENCOUNTER — Other Ambulatory Visit: Payer: Self-pay | Admitting: Cardiovascular Disease

## 2014-11-27 ENCOUNTER — Other Ambulatory Visit: Payer: Self-pay | Admitting: Internal Medicine

## 2014-11-27 NOTE — Telephone Encounter (Signed)
Rx(s) sent to pharmacy electronically.  

## 2014-12-02 ENCOUNTER — Other Ambulatory Visit: Payer: Self-pay | Admitting: Internal Medicine

## 2015-01-10 ENCOUNTER — Other Ambulatory Visit: Payer: Self-pay

## 2015-01-10 DIAGNOSIS — E139 Other specified diabetes mellitus without complications: Secondary | ICD-10-CM

## 2015-01-10 DIAGNOSIS — I5032 Chronic diastolic (congestive) heart failure: Secondary | ICD-10-CM

## 2015-01-10 MED ORDER — INSULIN LISPRO 100 UNIT/ML ~~LOC~~ SOLN: 65.0000 [IU] | Freq: Two times a day (BID) | SUBCUTANEOUS | Status: DC

## 2015-01-10 MED ORDER — INSULIN LISPRO PROT & LISPRO (75-25 MIX) 100 UNIT/ML ~~LOC~~ SUSP: 65.0000 [IU] | Freq: Two times a day (BID) | SUBCUTANEOUS | Status: DC

## 2015-01-10 MED ORDER — "INSULIN SYRINGE-NEEDLE U-100 30G X 5/16"" 1 ML MISC": Status: DC

## 2015-01-10 NOTE — Progress Notes (Unsigned)
Patient cam into office requesting a refill on her insulin Patient has not been seen here in a while A one month supply has been sent to her pharmacy and patient is aware she needs An appointment to be seen

## 2015-01-13 ENCOUNTER — Ambulatory Visit: Payer: Medicare Other | Attending: Internal Medicine | Admitting: Internal Medicine

## 2015-01-13 ENCOUNTER — Encounter: Payer: Self-pay | Admitting: Internal Medicine

## 2015-01-13 VITALS — BP 140/80 | HR 71 | Temp 98.0°F | Resp 16 | Wt 271.0 lb

## 2015-01-13 DIAGNOSIS — I5032 Chronic diastolic (congestive) heart failure: Secondary | ICD-10-CM | POA: Diagnosis not present

## 2015-01-13 DIAGNOSIS — Z1231 Encounter for screening mammogram for malignant neoplasm of breast: Secondary | ICD-10-CM | POA: Insufficient documentation

## 2015-01-13 DIAGNOSIS — I10 Essential (primary) hypertension: Secondary | ICD-10-CM | POA: Diagnosis not present

## 2015-01-13 DIAGNOSIS — M62838 Other muscle spasm: Secondary | ICD-10-CM | POA: Diagnosis not present

## 2015-01-13 DIAGNOSIS — E139 Other specified diabetes mellitus without complications: Secondary | ICD-10-CM | POA: Diagnosis not present

## 2015-01-13 DIAGNOSIS — N289 Disorder of kidney and ureter, unspecified: Secondary | ICD-10-CM | POA: Insufficient documentation

## 2015-01-13 DIAGNOSIS — Z794 Long term (current) use of insulin: Secondary | ICD-10-CM | POA: Diagnosis not present

## 2015-01-13 LAB — GLUCOSE, POCT (MANUAL RESULT ENTRY): POC Glucose: 280 mg/dl — AB (ref 70–99)

## 2015-01-13 MED ORDER — HYDRALAZINE HCL 50 MG PO TABS: 50.0000 mg | ORAL_TABLET | Freq: Two times a day (BID) | ORAL | Status: DC

## 2015-01-13 MED ORDER — LOSARTAN POTASSIUM-HCTZ 100-25 MG PO TABS: 1.0000 | ORAL_TABLET | Freq: Every day | ORAL | Status: DC

## 2015-01-13 MED ORDER — "INSULIN SYRINGE-NEEDLE U-100 30G X 5/16"" 1 ML MISC": Status: DC

## 2015-01-13 MED ORDER — POTASSIUM CHLORIDE CRYS ER 10 MEQ PO TBCR: 30.0000 meq | EXTENDED_RELEASE_TABLET | Freq: Two times a day (BID) | ORAL | Status: DC

## 2015-01-13 MED ORDER — BACLOFEN 10 MG PO TABS: 10.0000 mg | ORAL_TABLET | Freq: Every evening | ORAL | Status: DC | PRN

## 2015-01-13 MED ORDER — PANTOPRAZOLE SODIUM 40 MG PO TBEC: 40.0000 mg | DELAYED_RELEASE_TABLET | Freq: Every day | ORAL | Status: DC

## 2015-01-13 MED ORDER — INSULIN LISPRO PROT & LISPRO (75-25 MIX) 100 UNIT/ML ~~LOC~~ SUSP: 65.0000 [IU] | Freq: Two times a day (BID) | SUBCUTANEOUS | Status: DC

## 2015-01-13 NOTE — Progress Notes (Signed)
Patient here for follow up on her diabetes and for refills on her medications

## 2015-01-13 NOTE — Patient Instructions (Signed)
DASH Eating Plan DASH stands for "Dietary Approaches to Stop Hypertension." The DASH eating plan is a healthy eating plan that has been shown to reduce high blood pressure (hypertension). Additional health benefits may include reducing the risk of type 2 diabetes mellitus, heart disease, and stroke. The DASH eating plan may also help with weight loss. WHAT DO I NEED TO KNOW ABOUT THE DASH EATING PLAN? For the DASH eating plan, you will follow these general guidelines:  Choose foods with a percent daily value for sodium of less than 5% (as listed on the food label).  Use salt-free seasonings or herbs instead of table salt or sea salt.  Check with your health care provider or pharmacist before using salt substitutes.  Eat lower-sodium products, often labeled as "lower sodium" or "no salt added."  Eat fresh foods.  Eat more vegetables, fruits, and low-fat dairy products.  Choose whole grains. Look for the word "whole" as the first word in the ingredient list.  Choose fish and skinless chicken or turkey more often than red meat. Limit fish, poultry, and meat to 6 oz (170 g) each day.  Limit sweets, desserts, sugars, and sugary drinks.  Choose heart-healthy fats.  Limit cheese to 1 oz (28 g) per day.  Eat more home-cooked food and less restaurant, buffet, and fast food.  Limit fried foods.  Cook foods using methods other than frying.  Limit canned vegetables. If you do use them, rinse them well to decrease the sodium.  When eating at a restaurant, ask that your food be prepared with less salt, or no salt if possible. WHAT FOODS CAN I EAT? Seek help from a dietitian for individual calorie needs. Grains Whole grain or whole wheat bread. Brown rice. Whole grain or whole wheat pasta. Quinoa, bulgur, and whole grain cereals. Low-sodium cereals. Corn or whole wheat flour tortillas. Whole grain cornbread. Whole grain crackers. Low-sodium crackers. Vegetables Fresh or frozen vegetables  (raw, steamed, roasted, or grilled). Low-sodium or reduced-sodium tomato and vegetable juices. Low-sodium or reduced-sodium tomato sauce and paste. Low-sodium or reduced-sodium canned vegetables.  Fruits All fresh, canned (in natural juice), or frozen fruits. Meat and Other Protein Products Ground beef (85% or leaner), grass-fed beef, or beef trimmed of fat. Skinless chicken or turkey. Ground chicken or turkey. Pork trimmed of fat. All fish and seafood. Eggs. Dried beans, peas, or lentils. Unsalted nuts and seeds. Unsalted canned beans. Dairy Low-fat dairy products, such as skim or 1% milk, 2% or reduced-fat cheeses, low-fat ricotta or cottage cheese, or plain low-fat yogurt. Low-sodium or reduced-sodium cheeses. Fats and Oils Tub margarines without trans fats. Light or reduced-fat mayonnaise and salad dressings (reduced sodium). Avocado. Safflower, olive, or canola oils. Natural peanut or almond butter. Other Unsalted popcorn and pretzels. The items listed above may not be a complete list of recommended foods or beverages. Contact your dietitian for more options. WHAT FOODS ARE NOT RECOMMENDED? Grains White bread. White pasta. White rice. Refined cornbread. Bagels and croissants. Crackers that contain trans fat. Vegetables Creamed or fried vegetables. Vegetables in a cheese sauce. Regular canned vegetables. Regular canned tomato sauce and paste. Regular tomato and vegetable juices. Fruits Dried fruits. Canned fruit in light or heavy syrup. Fruit juice. Meat and Other Protein Products Fatty cuts of meat. Ribs, chicken wings, bacon, sausage, bologna, salami, chitterlings, fatback, hot dogs, bratwurst, and packaged luncheon meats. Salted nuts and seeds. Canned beans with salt. Dairy Whole or 2% milk, cream, half-and-half, and cream cheese. Whole-fat or sweetened yogurt. Full-fat   cheeses or blue cheese. Nondairy creamers and whipped toppings. Processed cheese, cheese spreads, or cheese  curds. Condiments Onion and garlic salt, seasoned salt, table salt, and sea salt. Canned and packaged gravies. Worcestershire sauce. Tartar sauce. Barbecue sauce. Teriyaki sauce. Soy sauce, including reduced sodium. Steak sauce. Fish sauce. Oyster sauce. Cocktail sauce. Horseradish. Ketchup and mustard. Meat flavorings and tenderizers. Bouillon cubes. Hot sauce. Tabasco sauce. Marinades. Taco seasonings. Relishes. Fats and Oils Butter, stick margarine, lard, shortening, ghee, and bacon fat. Coconut, palm kernel, or palm oils. Regular salad dressings. Other Pickles and olives. Salted popcorn and pretzels. The items listed above may not be a complete list of foods and beverages to avoid. Contact your dietitian for more information. WHERE CAN I FIND MORE INFORMATION? National Heart, Lung, and [employer] Institute: www.nhlbi.nih.gov/health/health-topics/topics/dash/ Document Released: 07/08/2011 Document Revised: 12/03/2013 Document Reviewed: 05/23/2013 Exit[employer] Patient Information 2015 [employer]. This information is not intended to replace advice given to you by your health care provider. Make sure you discuss any questions you have with your health care provider. Diabetes Mellitus and Food It is important for you to manage your blood sugar (glucose) level. Your blood glucose level can be greatly affected by what you eat. Eating healthier foods in the appropriate amounts throughout the day at about the same time each day will help you control your blood glucose level. It can also help slow or prevent worsening of your diabetes mellitus. Healthy eating may even help you improve the level of your blood pressure and reach or maintain a healthy weight.  HOW CAN FOOD AFFECT ME? Carbohydrates Carbohydrates affect your blood glucose level more than any other type of food. Your dietitian will help you determine how many carbohydrates to eat at each meal and teach you how to count carbohydrates. Counting  carbohydrates is important to keep your blood glucose at a healthy level, especially if you are using insulin or taking certain medicines for diabetes mellitus. Alcohol Alcohol can cause sudden decreases in blood glucose (hypoglycemia), especially if you use insulin or take certain medicines for diabetes mellitus. Hypoglycemia can be a life-threatening condition. Symptoms of hypoglycemia (sleepiness, dizziness, and disorientation) are similar to symptoms of having too much alcohol.  If your health care provider has given you approval to drink alcohol, do so in moderation and use the following guidelines:  Women should not have more than one drink per day, and men should not have more than two drinks per day. One drink is equal to:  12 oz of beer.  5 oz of wine.  1 oz of hard liquor.  Do not drink on an empty stomach.  Keep yourself hydrated. Have water, diet soda, or unsweetened iced tea.  Regular soda, juice, and other mixers might contain a lot of carbohydrates and should be counted. WHAT FOODS ARE NOT RECOMMENDED? As you make food choices, it is important to remember that all foods are not the same. Some foods have fewer nutrients per serving than other foods, even though they might have the same number of calories or carbohydrates. It is difficult to get your body what it needs when you eat foods with fewer nutrients. Examples of foods that you should avoid that are high in calories and carbohydrates but low in nutrients include:  Trans fats (most processed foods list trans fats on the Nutrition Facts label).  Regular soda.  Juice.  Candy.  Sweets, such as cake, pie, doughnuts, and cookies.  Fried foods. WHAT FOODS CAN I EAT? Have nutrient-rich foods,   which will nourish your body and keep you healthy. The food you should eat also will depend on several factors, including:  The calories you need.  The medicines you take.  Your weight.  Your blood glucose level.  Your  blood pressure level.  Your cholesterol level. You also should eat a variety of foods, including:  Protein, such as meat, poultry, fish, tofu, nuts, and seeds (lean animal proteins are best).  Fruits.  Vegetables.  Dairy products, such as milk, cheese, and yogurt (low fat is best).  Breads, grains, pasta, cereal, rice, and beans.  Fats such as olive oil, trans fat-free margarine, canola oil, avocado, and olives. DOES EVERYONE WITH DIABETES MELLITUS HAVE THE SAME MEAL PLAN? Because every person with diabetes mellitus is different, there is not one meal plan that works for everyone. It is very important that you meet with a dietitian who will help you create a meal plan that is just right for you. Document Released: 04/15/2005 Document Revised: 07/24/2013 Document Reviewed: 06/15/2013 Exit[employer] Patient Information 2015 [employer]. This information is not intended to replace advice given to you by your health care provider. Make sure you discuss any questions you have with your health care provider.  

## 2015-01-13 NOTE — Progress Notes (Signed)
MRN: [id number] Name: [name]  Sex: female Age: 59 y.o. DOB: January 09, 1956  Allergies: Lorazepam and Orange fruit  Chief Complaint  Patient presents with  . Follow-up    HPI: Patient is 59 y.o. female who history of diabetes, hypertension, CKD, CAD comes today for followup , patient recently had a blood work done ordered by her cardiologist which was reviewed with the patient noticed a stable kidney function but her A1c has trended up, patient has not been checking her blood sugar level everyday, she needs refill on her medication, currently denies any acute symptoms denies any headache dizziness chest and shortness of breath.today the blood pressure is borderline elevated as per patient she has not taken all her medications.  Past Medical History  Diagnosis Date  . Diabetes mellitus   . Hypertension   . Hyperlipemia   . Coronary artery disease     Medical Rx  . GERD (gastroesophageal reflux disease)   . Morbid obesity with BMI of 40.0-44.9, adult   . Anemia   . Obstructive sleep apnea     no sleep study  . Diabetic retinopathy   . Chronic diastolic heart failure 4/65    grade 1 by echo, EF 55-60%    Past Surgical History  Procedure Laterality Date  . Cardiac catheterization  July 2010  . Incision and drainage perirectal abscess  04/03/2012  . Groin abscess removed      left  . Partial hysterectomy  1981      Medication List       This list is accurate as of: 01/13/15 10:43 AM.  Always use your most recent med list.               acetaminophen 500 MG tablet  Commonly known as:  TYLENOL  Take 500 mg by mouth every 6 (six) hours as needed. For pain     acetaZOLAMIDE 500 MG capsule  Commonly known as:  DIAMOX     aspirin 81 MG tablet  Take 81 mg by mouth daily.     baclofen 10 MG tablet  Commonly known as:  LIORESAL  Take 1 tablet (10 mg total) by mouth at bedtime as needed for muscle spasms.     bisacodyl 5 MG EC tablet  Commonly known as:   DULCOLAX  Take 1 tablet (5 mg total) by mouth daily as needed. For constipation     bismuth subsalicylate 035 WS/56CL suspension  Commonly known as:  PEPTO BISMOL  Take 30 mLs by mouth every 6 (six) hours as needed.     COMBIGAN 0.2-0.5 % ophthalmic solution  Generic drug:  brimonidine-timolol  Place 1 drop into both eyes every 12 (twelve) hours.     dextromethorphan 15 MG/5ML syrup  Take 10 mLs by mouth 4 (four) times daily as needed for cough.     diltiazem 240 MG 24 hr capsule  Commonly known as:  DILACOR XR  TAKE ONE CAPSULE BY MOUTH ONCE DAILY     docusate sodium 50 MG capsule  Commonly known as:  COLACE  Take 100 mg by mouth 2 (two) times daily as needed for constipation.     ferrous sulfate 325 (65 FE) MG tablet  Take 325 mg by mouth daily with breakfast.     furosemide 40 MG tablet  Commonly known as:  LASIX  Take 2 tablets in the morning and 1 tablet in the afternoon.     glucose blood test strip  Commonly known as:  FREESTYLE LITE  Use as instructed     glucose monitoring kit monitoring kit  1 each by Does not apply route 4 (four) times daily - after meals and at bedtime. 1 month Diabetic Testing Supplies for QAC-QHS accuchecks. Test up to 4 times daily E11.9     hydrALAZINE 50 MG tablet  Commonly known as:  APRESOLINE  Take 1 tablet (50 mg total) by mouth 2 (two) times daily.     insulin lispro 100 UNIT/ML injection  Commonly known as:  HUMALOG  Inject 0.65 mLs (65 Units total) into the skin 2 (two) times daily.     insulin lispro protamine-lispro (75-25) 100 UNIT/ML Susp injection  Commonly known as:  HUMALOG MIX 75/25  Inject 65 Units into the skin 2 (two) times daily with a meal.     losartan-hydrochlorothiazide 100-25 MG per tablet  Commonly known as:  HYZAAR  Take 1 tablet by mouth daily.     LUMIGAN 0.01 % Soln  Generic drug:  bimatoprost  Place 1 drop into both eyes 2 (two) times daily.     metoprolol 200 MG 24 hr tablet  Commonly known as:   TOPROL-XL  TAKE ONE TABLET BY MOUTH ONCE DAILY     nitroGLYCERIN 0.4 MG SL tablet  Commonly known as:  NITROSTAT  Place 1 tablet (0.4 mg total) under the tongue every 5 (five) minutes as needed. For chest pain     pantoprazole 40 MG tablet  Commonly known as:  PROTONIX  Take 1 tablet (40 mg total) by mouth daily.     polyvinyl alcohol 1.4 % ophthalmic solution  Commonly known as:  LIQUIFILM TEARS  Place 1 drop into both eyes daily as needed. For dry eyes     potassium chloride 10 MEQ tablet  Commonly known as:  KLOR-CON M10  Take 3 tablets (30 mEq total) by mouth 2 (two) times daily.     RANEXA 500 MG 12 hr tablet  Generic drug:  ranolazine  TAKE ONE TABLET BY MOUTH TWICE DAILY     RELION INSULIN SYR 1CC/30G 30G X 5/16" 1 ML Misc  Generic drug:  Insulin Syringe-Needle U-100  USE AS DIRECTED     Insulin Syringe-Needle U-100 30G X 5/16" 1 ML Misc  Commonly known as:  RELION INSULIN SYR 1CC/30G  USE AS DIRECTED     RESTASIS 0.05 % ophthalmic emulsion  Generic drug:  cycloSPORINE  Place 1 drop into both eyes 2 (two) times daily.     VYTORIN 10-20 MG per tablet  Generic drug:  ezetimibe-simvastatin  TAKE ONE TABLET BY MOUTH ONCE DAILY        Meds ordered this encounter  Medications  . hydrALAZINE (APRESOLINE) 50 MG tablet    Sig: Take 1 tablet (50 mg total) by mouth 2 (two) times daily.    Dispense:  60 tablet    Refill:  6  . losartan-hydrochlorothiazide (HYZAAR) 100-25 MG per tablet    Sig: Take 1 tablet by mouth daily.    Dispense:  90 tablet    Refill:  1  . pantoprazole (PROTONIX) 40 MG tablet    Sig: Take 1 tablet (40 mg total) by mouth daily.    Dispense:  30 tablet    Refill:  7  . potassium chloride (KLOR-CON M10) 10 MEQ tablet    Sig: Take 3 tablets (30 mEq total) by mouth 2 (two) times daily.    Dispense:  180 tablet    Refill:  9  .  insulin lispro protamine-lispro (HUMALOG MIX 75/25) (75-25) 100 UNIT/ML SUSP injection    Sig: Inject 65 Units into  the skin 2 (two) times daily with a meal.    Dispense:  40 mL    Refill:  3  . baclofen (LIORESAL) 10 MG tablet    Sig: Take 1 tablet (10 mg total) by mouth at bedtime as needed for muscle spasms.    Dispense:  30 each    Refill:  2  . Insulin Syringe-Needle U-100 (RELION INSULIN SYR 1CC/30G) 30G X 5/16" 1 ML MISC    Sig: USE AS DIRECTED    Dispense:  100 each    Refill:  0    Immunization History  Administered Date(s) Administered  . Influenza Whole 05/22/2009  . Influenza,inj,Quad PF,36+ Mos 07/19/2014  . Pneumococcal Polysaccharide-23 05/22/2009  . Td 08/02/2002    Family History  Problem Relation Age of Onset  . Colon polyps Father   . Diabetes Mother   . Diabetes Father   . Diabetes Brother   . Diabetes Sister   . Heart disease Father   . Heart disease Brother   . Heart disease Sister     History  Substance Use Topics  . Smoking status: Never Smoker   . Smokeless tobacco: Never Used  . Alcohol Use: No    Review of Systems   As noted in HPI  Filed Vitals:   01/13/15 0956  BP: 140/80  Pulse:   Temp:   Resp:     Physical Exam  Physical Exam  Constitutional: No distress.  Eyes: EOM are normal. Pupils are equal, round, and reactive to light.  Cardiovascular: Normal rate and regular rhythm.   Pulmonary/Chest: Breath sounds normal. No respiratory distress. She has no wheezes. She has no rales.  Musculoskeletal: She exhibits no edema.    CBC    Component Value Date/Time   WBC 10.2 03/14/2013 1015   RBC 4.95 03/14/2013 1015   RBC * 02/06/2010 0100    2.49 CORRECTED ON 07/09 AT 0624: PREVIOUSLY REPORTED AS 2.26   HGB 15.4* 03/14/2013 1015   HCT 46.7* 03/14/2013 1015   PLT 216 03/14/2013 1015   MCV 94.3 03/14/2013 1015   LYMPHSABS 1.8 04/18/2012 1307   MONOABS 0.5 04/18/2012 1307   EOSABS 0.0 04/18/2012 1307   BASOSABS 0.0 04/18/2012 1307    CMP     Component Value Date/Time   NA 136 11/14/2014 0920   K 4.4 11/14/2014 0920   CL 105  11/14/2014 0920   CO2 21 11/14/2014 0920   GLUCOSE 134* 11/14/2014 0920   BUN 15 11/14/2014 0920   CREATININE 1.18* 11/14/2014 0920   CREATININE 1.51* 04/18/2012 1307   CALCIUM 8.9 11/14/2014 0920   PROT 6.8 11/14/2014 0920   ALBUMIN 3.5 11/14/2014 0920   AST 11 11/14/2014 0920   ALT 8 11/14/2014 0920   ALKPHOS 52 11/14/2014 0920   BILITOT 0.3 11/14/2014 0920   GFRNONAA 26* 03/14/2013 1015   GFRNONAA 37* 04/18/2012 1307   GFRAA 30* 03/14/2013 1015   GFRAA 43* 04/18/2012 1307    Lab Results  Component Value Date/Time   CHOL 100 11/14/2014 09:20 AM    Lab Results  Component Value Date/Time   HGBA1C 7.9* 11/14/2014 09:20 AM   HGBA1C 7.0 07/19/2014 09:48 AM    Lab Results  Component Value Date/Time   AST 11 11/14/2014 09:20 AM    Assessment and Plan  Other specified diabetes mellitus without complications - Plan:  Results for  orders placed or performed in visit on 01/13/15  Glucose (CBG)  Result Value Ref Range   POC Glucose 280 (A) 70 - 99 mg/dl   Last hemoglobin A1c was 7.9%, I have advised patient for diabetes planning, have advised patient to keep the fingerstick log continue with current meds. insulin lispro protamine-lispro (HUMALOG MIX 75/25) (75-25) 100 UNIT/ML SUSP injection, Insulin Syringe-Needle U-100 (RELION INSULIN SYR 1CC/30G) 30G X 5/16" 1 ML MISC  Essential hypertension, benign - Plan:blood pressure is borderline elevated, advise patient for DASH diet continue with  hydrALAZINE (APRESOLINE) 50 MG tablet, losartan-hydrochlorothiazide (HYZAAR) 100-25 MG per tablet  Renal insufficiency Recent blood chemistry shows stable kidney function, following up with nephrology  Chronic diastolic heart failure, NYHA class 3 Patient is on aspirin statin metoprolol, Lasix losartan, follow with cardiology  Muscle spasm of left lower extremity - Plan: baclofen (LIORESAL) 10 MG tablet  Encounter for screening mammogram for breast cancer - Plan: MM DIGITAL SCREENING  BILATERAL   Health Maintenance  -Mammogram:ordered   Return in about 3 months (around 04/15/2015).   This note has been created with Surveyor, quantity. Any transcriptional errors are unintentional.    Lorayne Marek, MD

## 2015-02-05 ENCOUNTER — Ambulatory Visit (HOSPITAL_COMMUNITY): Payer: Medicare Other

## 2015-02-25 ENCOUNTER — Other Ambulatory Visit: Payer: Self-pay | Admitting: Cardiovascular Disease

## 2015-02-25 NOTE — Telephone Encounter (Signed)
REFILL 

## 2015-03-09 ENCOUNTER — Other Ambulatory Visit: Payer: Self-pay | Admitting: Cardiovascular Disease

## 2015-03-10 NOTE — Telephone Encounter (Signed)
REFILL 

## 2015-03-18 ENCOUNTER — Emergency Department (HOSPITAL_COMMUNITY): Payer: Medicare Other

## 2015-03-18 ENCOUNTER — Observation Stay (HOSPITAL_COMMUNITY)
Admission: EM | Admit: 2015-03-18 | Discharge: 2015-03-22 | Disposition: A | Discharge status: Home / Self Care | Payer: Medicare Other | Attending: Cardiology | Admitting: Cardiology

## 2015-03-18 ENCOUNTER — Encounter (HOSPITAL_COMMUNITY): Payer: Self-pay | Admitting: Family Medicine

## 2015-03-18 DIAGNOSIS — Z90710 Acquired absence of both cervix and uterus: Secondary | ICD-10-CM | POA: Diagnosis not present

## 2015-03-18 DIAGNOSIS — E780 Pure hypercholesterolemia: Secondary | ICD-10-CM | POA: Diagnosis not present

## 2015-03-18 DIAGNOSIS — E785 Hyperlipidemia, unspecified: Secondary | ICD-10-CM | POA: Diagnosis not present

## 2015-03-18 DIAGNOSIS — IMO0002 Reserved for concepts with insufficient information to code with codable children: Secondary | ICD-10-CM | POA: Diagnosis present

## 2015-03-18 DIAGNOSIS — R42 Dizziness and giddiness: Secondary | ICD-10-CM | POA: Insufficient documentation

## 2015-03-18 DIAGNOSIS — I130 Hypertensive heart and chronic kidney disease with heart failure and stage 1 through stage 4 chronic kidney disease, or unspecified chronic kidney disease: Secondary | ICD-10-CM | POA: Diagnosis not present

## 2015-03-18 DIAGNOSIS — R944 Abnormal results of kidney function studies: Secondary | ICD-10-CM | POA: Diagnosis not present

## 2015-03-18 DIAGNOSIS — Z794 Long term (current) use of insulin: Secondary | ICD-10-CM | POA: Diagnosis not present

## 2015-03-18 DIAGNOSIS — I5032 Chronic diastolic (congestive) heart failure: Secondary | ICD-10-CM | POA: Diagnosis present

## 2015-03-18 DIAGNOSIS — R51 Headache: Secondary | ICD-10-CM | POA: Insufficient documentation

## 2015-03-18 DIAGNOSIS — K219 Gastro-esophageal reflux disease without esophagitis: Secondary | ICD-10-CM | POA: Insufficient documentation

## 2015-03-18 DIAGNOSIS — I5189 Other ill-defined heart diseases: Secondary | ICD-10-CM | POA: Diagnosis not present

## 2015-03-18 DIAGNOSIS — Z7902 Long term (current) use of antithrombotics/antiplatelets: Secondary | ICD-10-CM | POA: Insufficient documentation

## 2015-03-18 DIAGNOSIS — Z79899 Other long term (current) drug therapy: Secondary | ICD-10-CM | POA: Diagnosis not present

## 2015-03-18 DIAGNOSIS — Z6841 Body Mass Index (BMI) 40.0 and over, adult: Secondary | ICD-10-CM | POA: Insufficient documentation

## 2015-03-18 DIAGNOSIS — I25110 Atherosclerotic heart disease of native coronary artery with unstable angina pectoris: Principal | ICD-10-CM | POA: Insufficient documentation

## 2015-03-18 DIAGNOSIS — Z888 Allergy status to other drugs, medicaments and biological substances status: Secondary | ICD-10-CM | POA: Insufficient documentation

## 2015-03-18 DIAGNOSIS — N183 Chronic kidney disease, stage 3 unspecified: Secondary | ICD-10-CM | POA: Diagnosis present

## 2015-03-18 DIAGNOSIS — Z8371 Family history of colonic polyps: Secondary | ICD-10-CM | POA: Diagnosis not present

## 2015-03-18 DIAGNOSIS — I200 Unstable angina: Secondary | ICD-10-CM | POA: Diagnosis present

## 2015-03-18 DIAGNOSIS — I4581 Long QT syndrome: Secondary | ICD-10-CM | POA: Diagnosis not present

## 2015-03-18 DIAGNOSIS — G4733 Obstructive sleep apnea (adult) (pediatric): Secondary | ICD-10-CM | POA: Diagnosis not present

## 2015-03-18 DIAGNOSIS — R112 Nausea with vomiting, unspecified: Secondary | ICD-10-CM | POA: Insufficient documentation

## 2015-03-18 DIAGNOSIS — E11359 Type 2 diabetes mellitus with proliferative diabetic retinopathy without macular edema: Secondary | ICD-10-CM | POA: Insufficient documentation

## 2015-03-18 DIAGNOSIS — R0602 Shortness of breath: Secondary | ICD-10-CM | POA: Diagnosis not present

## 2015-03-18 DIAGNOSIS — I071 Rheumatic tricuspid insufficiency: Secondary | ICD-10-CM | POA: Insufficient documentation

## 2015-03-18 DIAGNOSIS — I2510 Atherosclerotic heart disease of native coronary artery without angina pectoris: Secondary | ICD-10-CM | POA: Diagnosis present

## 2015-03-18 DIAGNOSIS — N179 Acute kidney failure, unspecified: Secondary | ICD-10-CM | POA: Diagnosis not present

## 2015-03-18 DIAGNOSIS — I5033 Acute on chronic diastolic (congestive) heart failure: Secondary | ICD-10-CM | POA: Diagnosis present

## 2015-03-18 DIAGNOSIS — Z833 Family history of diabetes mellitus: Secondary | ICD-10-CM | POA: Diagnosis not present

## 2015-03-18 DIAGNOSIS — I10 Essential (primary) hypertension: Secondary | ICD-10-CM | POA: Diagnosis present

## 2015-03-18 DIAGNOSIS — E1165 Type 2 diabetes mellitus with hyperglycemia: Secondary | ICD-10-CM

## 2015-03-18 DIAGNOSIS — Z7982 Long term (current) use of aspirin: Secondary | ICD-10-CM | POA: Diagnosis not present

## 2015-03-18 DIAGNOSIS — Z8249 Family history of ischemic heart disease and other diseases of the circulatory system: Secondary | ICD-10-CM | POA: Diagnosis not present

## 2015-03-18 DIAGNOSIS — Z91018 Allergy to other foods: Secondary | ICD-10-CM | POA: Insufficient documentation

## 2015-03-18 DIAGNOSIS — E1122 Type 2 diabetes mellitus with diabetic chronic kidney disease: Secondary | ICD-10-CM | POA: Diagnosis not present

## 2015-03-18 DIAGNOSIS — R05 Cough: Secondary | ICD-10-CM | POA: Insufficient documentation

## 2015-03-18 DIAGNOSIS — I4510 Unspecified right bundle-branch block: Secondary | ICD-10-CM | POA: Diagnosis not present

## 2015-03-18 DIAGNOSIS — E1129 Type 2 diabetes mellitus with other diabetic kidney complication: Secondary | ICD-10-CM | POA: Diagnosis present

## 2015-03-18 DIAGNOSIS — E113599 Type 2 diabetes mellitus with proliferative diabetic retinopathy without macular edema, unspecified eye: Secondary | ICD-10-CM | POA: Diagnosis present

## 2015-03-18 HISTORY — DX: Dorsalgia, unspecified: M54.9

## 2015-03-18 HISTORY — DX: Other chronic pain: G89.29

## 2015-03-18 HISTORY — DX: Blindness, one eye, unspecified eye: H54.40

## 2015-03-18 LAB — COMPREHENSIVE METABOLIC PANEL
ALK PHOS: 53 U/L (ref 38–126)
ALT: 12 U/L — ABNORMAL LOW (ref 14–54)
ANION GAP: 10 (ref 5–15)
AST: 15 U/L (ref 15–41)
Albumin: 3 g/dL — ABNORMAL LOW (ref 3.5–5.0)
BILIRUBIN TOTAL: 0.3 mg/dL (ref 0.3–1.2)
BUN: 14 mg/dL (ref 6–20)
CALCIUM: 8.7 mg/dL — AB (ref 8.9–10.3)
CO2: 21 mmol/L — ABNORMAL LOW (ref 22–32)
Chloride: 105 mmol/L (ref 101–111)
Creatinine, Ser: 1.21 mg/dL — ABNORMAL HIGH (ref 0.44–1.00)
GFR calc Af Amer: 55 mL/min — ABNORMAL LOW (ref >60)
GFR, EST NON AFRICAN AMERICAN: 48 mL/min — AB (ref >60)
Glucose, Bld: 358 mg/dL — ABNORMAL HIGH (ref 65–99)
POTASSIUM: 4 mmol/L (ref 3.5–5.1)
Sodium: 136 mmol/L (ref 135–145)
TOTAL PROTEIN: 6.9 g/dL (ref 6.5–8.1)

## 2015-03-18 LAB — CBC WITH DIFFERENTIAL/PLATELET
Basophils Absolute: 0 10*3/uL (ref 0.0–0.1)
Basophils Relative: 0 % (ref 0–1)
EOS ABS: 0 10*3/uL (ref 0.0–0.7)
EOS PCT: 0 % (ref 0–5)
HCT: 38.5 % (ref 36.0–46.0)
Hemoglobin: 12.7 g/dL (ref 12.0–15.0)
LYMPHS ABS: 1.5 10*3/uL (ref 0.7–4.0)
Lymphocytes Relative: 12 % (ref 12–46)
MCH: 30.4 pg (ref 26.0–34.0)
MCHC: 33 g/dL (ref 30.0–36.0)
MCV: 92.1 fL (ref 78.0–100.0)
MONO ABS: 0.5 10*3/uL (ref 0.1–1.0)
MONOS PCT: 4 % (ref 3–12)
Neutro Abs: 10.5 10*3/uL — ABNORMAL HIGH (ref 1.7–7.7)
Neutrophils Relative %: 84 % — ABNORMAL HIGH (ref 43–77)
PLATELETS: 253 10*3/uL (ref 150–400)
RBC: 4.18 MIL/uL (ref 3.87–5.11)
RDW: 13.3 % (ref 11.5–15.5)
WBC: 12.5 10*3/uL — ABNORMAL HIGH (ref 4.0–10.5)

## 2015-03-18 LAB — TROPONIN I: Troponin I: <0.03 ng/mL (ref <0.031)

## 2015-03-18 LAB — GLUCOSE, CAPILLARY
Glucose-Capillary: 286 mg/dL — ABNORMAL HIGH (ref 65–99)
Glucose-Capillary: 241 mg/dL — ABNORMAL HIGH (ref 65–99)

## 2015-03-18 LAB — HEPARIN LEVEL (UNFRACTIONATED): Heparin Unfractionated: 0.26 IU/mL — ABNORMAL LOW (ref 0.30–0.70)

## 2015-03-18 LAB — I-STAT TROPONIN, ED: Troponin i, poc: 0 ng/mL (ref 0.00–0.08)

## 2015-03-18 LAB — LIPASE, BLOOD: LIPASE: 21 U/L — AB (ref 22–51)

## 2015-03-18 LAB — BRAIN NATRIURETIC PEPTIDE: B Natriuretic Peptide: 21.8 pg/mL (ref 0.0–100.0)

## 2015-03-18 MED ORDER — RANOLAZINE ER 500 MG PO TB12
500.0000 mg | ORAL_TABLET | Freq: Two times a day (BID) | ORAL | Status: DC
  Administered 2015-03-18 – 2015-03-22 (×8): 500 mg via ORAL
  Filled 2015-03-18 (×10): qty 1

## 2015-03-18 MED ORDER — METOPROLOL SUCCINATE ER 100 MG PO TB24
200.0000 mg | ORAL_TABLET | Freq: Every day | ORAL | Status: DC
Start: 2015-03-18 — End: 2015-03-22
  Administered 2015-03-18 – 2015-03-22 (×5): 200 mg via ORAL
  Filled 2015-03-18 (×5): qty 2

## 2015-03-18 MED ORDER — ISOSORBIDE MONONITRATE ER 60 MG PO TB24
60.0000 mg | ORAL_TABLET | Freq: Every day | ORAL | Status: DC
  Administered 2015-03-18 – 2015-03-22 (×5): 60 mg via ORAL
  Filled 2015-03-18 (×5): qty 1

## 2015-03-18 MED ORDER — LATANOPROST 0.005 % OP SOLN
1.0000 [drp] | Freq: Every day | OPHTHALMIC | Status: DC
  Administered 2015-03-18 – 2015-03-21 (×2): 1 [drp] via OPHTHALMIC
  Filled 2015-03-18 (×4): qty 2.5

## 2015-03-18 MED ORDER — HEPARIN BOLUS VIA INFUSION
4000.0000 [IU] | Freq: Once | INTRAVENOUS | Status: AC
  Administered 2015-03-18: 4000 [IU] via INTRAVENOUS
  Filled 2015-03-18: qty 4000

## 2015-03-18 MED ORDER — ONDANSETRON HCL 4 MG/2ML IJ SOLN
4.0000 mg | Freq: Once | INTRAMUSCULAR | Status: AC
  Administered 2015-03-18: 4 mg via INTRAVENOUS
  Filled 2015-03-18: qty 2

## 2015-03-18 MED ORDER — BRIMONIDINE TARTRATE 0.2 % OP SOLN
1.0000 [drp] | Freq: Two times a day (BID) | OPHTHALMIC | Status: DC
  Administered 2015-03-18 – 2015-03-22 (×7): 1 [drp] via OPHTHALMIC
  Filled 2015-03-18: qty 5

## 2015-03-18 MED ORDER — POTASSIUM CHLORIDE CRYS ER 20 MEQ PO TBCR
30.0000 meq | EXTENDED_RELEASE_TABLET | Freq: Two times a day (BID) | ORAL | Status: DC
  Administered 2015-03-18 – 2015-03-22 (×8): 30 meq via ORAL
  Filled 2015-03-18 (×12): qty 1

## 2015-03-18 MED ORDER — HEPARIN (PORCINE) IN NACL 100-0.45 UNIT/ML-% IJ SOLN
1200.0000 [IU]/h | INTRAMUSCULAR | Status: DC
  Administered 2015-03-18: 1000 [IU]/h via INTRAVENOUS
  Administered 2015-03-19: 1200 [IU]/h via INTRAVENOUS
  Filled 2015-03-18 (×2): qty 250

## 2015-03-18 MED ORDER — PANTOPRAZOLE SODIUM 40 MG PO TBEC
40.0000 mg | DELAYED_RELEASE_TABLET | Freq: Every day | ORAL | Status: DC
  Administered 2015-03-18 – 2015-03-22 (×5): 40 mg via ORAL
  Filled 2015-03-18 (×4): qty 1

## 2015-03-18 MED ORDER — ASPIRIN 81 MG PO CHEW
81.0000 mg | CHEWABLE_TABLET | Freq: Every day | ORAL | Status: DC
  Administered 2015-03-18 – 2015-03-22 (×5): 81 mg via ORAL
  Filled 2015-03-18 (×6): qty 1

## 2015-03-18 MED ORDER — FUROSEMIDE 10 MG/ML IJ SOLN
40.0000 mg | Freq: Once | INTRAMUSCULAR | Status: AC
  Administered 2015-03-18: 40 mg via INTRAVENOUS
  Filled 2015-03-18: qty 4

## 2015-03-18 MED ORDER — POLYETHYLENE GLYCOL 3350 17 G PO PACK: 17.0000 g | PACK | Freq: Every day | ORAL | Status: DC | PRN

## 2015-03-18 MED ORDER — INSULIN ASPART PROT & ASPART (70-30 MIX) 100 UNIT/ML ~~LOC~~ SUSP
65.0000 [IU] | Freq: Two times a day (BID) | SUBCUTANEOUS | Status: DC
  Administered 2015-03-18 – 2015-03-21 (×7): 65 [IU] via SUBCUTANEOUS
  Filled 2015-03-18: qty 10

## 2015-03-18 MED ORDER — ACETAZOLAMIDE ER 500 MG PO CP12
500.0000 mg | ORAL_CAPSULE | Freq: Two times a day (BID) | ORAL | Status: DC
  Administered 2015-03-18 – 2015-03-22 (×8): 500 mg via ORAL
  Filled 2015-03-18 (×10): qty 1

## 2015-03-18 MED ORDER — HYDRALAZINE HCL 50 MG PO TABS
50.0000 mg | ORAL_TABLET | Freq: Two times a day (BID) | ORAL | Status: DC
  Administered 2015-03-18 – 2015-03-22 (×8): 50 mg via ORAL
  Filled 2015-03-18 (×10): qty 1

## 2015-03-18 MED ORDER — CLOPIDOGREL BISULFATE 75 MG PO TABS
75.0000 mg | ORAL_TABLET | Freq: Every day | ORAL | Status: DC
Start: 2015-03-18 — End: 2015-03-22
  Administered 2015-03-18 – 2015-03-22 (×5): 75 mg via ORAL
  Filled 2015-03-18 (×5): qty 1

## 2015-03-18 MED ORDER — MORPHINE SULFATE (PF) 2 MG/ML IV SOLN
2.0000 mg | Freq: Once | INTRAVENOUS | Status: AC
  Administered 2015-03-18: 2 mg via INTRAVENOUS
  Filled 2015-03-18: qty 1

## 2015-03-18 MED ORDER — DILTIAZEM HCL ER 240 MG PO CP24
240.0000 mg | ORAL_CAPSULE | Freq: Every day | ORAL | Status: DC
  Administered 2015-03-18 – 2015-03-22 (×5): 240 mg via ORAL
  Filled 2015-03-18 (×7): qty 1

## 2015-03-18 MED ORDER — ASPIRIN 81 MG PO CHEW
324.0000 mg | CHEWABLE_TABLET | Freq: Once | ORAL | Status: AC
  Administered 2015-03-18: 324 mg via ORAL
  Filled 2015-03-18: qty 4

## 2015-03-18 MED ORDER — FUROSEMIDE 10 MG/ML IJ SOLN
80.0000 mg | Freq: Two times a day (BID) | INTRAMUSCULAR | Status: DC
  Administered 2015-03-19 – 2015-03-20 (×3): 80 mg via INTRAVENOUS
  Filled 2015-03-18 (×6): qty 8

## 2015-03-18 MED ORDER — BRIMONIDINE TARTRATE-TIMOLOL 0.2-0.5 % OP SOLN
1.0000 [drp] | Freq: Two times a day (BID) | OPHTHALMIC | Status: DC
  Filled 2015-03-18: qty 10

## 2015-03-18 MED ORDER — ATORVASTATIN CALCIUM 80 MG PO TABS
80.0000 mg | ORAL_TABLET | Freq: Every day | ORAL | Status: DC
  Administered 2015-03-18 – 2015-03-22 (×5): 80 mg via ORAL
  Filled 2015-03-18 (×8): qty 1

## 2015-03-18 MED ORDER — ONDANSETRON HCL 4 MG/2ML IJ SOLN
4.0000 mg | Freq: Four times a day (QID) | INTRAMUSCULAR | Status: DC | PRN
  Administered 2015-03-18 – 2015-03-19 (×2): 4 mg via INTRAVENOUS
  Filled 2015-03-18 (×2): qty 2

## 2015-03-18 MED ORDER — ACETAMINOPHEN 325 MG PO TABS
650.0000 mg | ORAL_TABLET | ORAL | Status: DC | PRN
  Administered 2015-03-21: 650 mg via ORAL
  Filled 2015-03-18 (×2): qty 2

## 2015-03-18 MED ORDER — NITROGLYCERIN 0.4 MG SL SUBL
0.4000 mg | SUBLINGUAL_TABLET | SUBLINGUAL | Status: AC | PRN
  Administered 2015-03-18 (×3): 0.4 mg via SUBLINGUAL
  Filled 2015-03-18: qty 1

## 2015-03-18 MED ORDER — FERROUS SULFATE 325 (65 FE) MG PO TABS
325.0000 mg | ORAL_TABLET | Freq: Every day | ORAL | Status: DC
  Filled 2015-03-18: qty 1

## 2015-03-18 MED ORDER — INSULIN ASPART 100 UNIT/ML ~~LOC~~ SOLN
0.0000 [IU] | Freq: Three times a day (TID) | SUBCUTANEOUS | Status: DC
  Administered 2015-03-19: 5 [IU] via SUBCUTANEOUS
  Administered 2015-03-19 – 2015-03-20 (×2): 2 [IU] via SUBCUTANEOUS
  Administered 2015-03-21: 3 [IU] via SUBCUTANEOUS
  Administered 2015-03-22: 8 [IU] via SUBCUTANEOUS

## 2015-03-18 MED ORDER — TIMOLOL MALEATE 0.5 % OP SOLN
1.0000 [drp] | Freq: Two times a day (BID) | OPHTHALMIC | Status: DC
  Administered 2015-03-18 – 2015-03-22 (×8): 1 [drp] via OPHTHALMIC
  Filled 2015-03-18: qty 5

## 2015-03-18 MED ORDER — BACLOFEN 10 MG PO TABS
10.0000 mg | ORAL_TABLET | Freq: Every evening | ORAL | Status: DC | PRN
  Filled 2015-03-18: qty 1

## 2015-03-18 MED ORDER — NITROGLYCERIN 0.4 MG SL SUBL: 0.4000 mg | SUBLINGUAL_TABLET | SUBLINGUAL | Status: DC | PRN

## 2015-03-18 NOTE — ED Notes (Signed)
Pt presents from home with c/o new onset CP that began last night around 2330.  Pt reports pain worsens with any exertion, and states her BLE have been more swollen than normal.

## 2015-03-18 NOTE — ED Notes (Signed)
MD at the bedside  

## 2015-03-18 NOTE — ED Notes (Signed)
Pt brought back to room via wheelchair with family in tow; pt undressed, in gown, on monitor, continuous pulse oximetry and blood pressure cuff

## 2015-03-18 NOTE — ED Notes (Signed)
Lab called to verify blood work. Stated lab computer system was down. Stated They would print and send lab work.

## 2015-03-18 NOTE — H&P (Addendum)
Patient ID: [name] MRN: [id number], DOB/AGE: 1956/04/30   Admit date: 03/18/2015   Primary Physician: Lorayne Marek, MD Primary Cardiologist: Dr. Sallyanne Kuster   AJO:INOMVE [name] is a 59 y.o. female with history of CAD (last cath 2010 showed diffuse right coronary, circumflex, and first diagonal disease-turned down for CABG - now medically managed), Diastolic HF, OSA, Type II DM, HLD, obesity, and HTN. She presents to the [facility] ED on 03/18/2015 for chest pain that has been present over the past 2 days. She reports having a substernal pain/pressure that started on Sunday and has gradually became worse. The pain was a 10/10 this morning and decreased to an 8/10 with administration of SL NTG. Having received more SL NTG and Morphine while in the ED, she now rates her pain as a 4/10. She reports having associated shortness of breath which is not a new symptom for the patient. Her husband reports she is not active and becomes short of breath when walking around the house as well as when at rest. She reports orthopnea (uses 5 pillows at night). She also endorses nausea, dizziness, headaches, and pain down her left arm saying the symptoms have been present for several months.   She also reports having a productive cough that has been present for several months. No recent illnesses or exposures. She says her legs are also "always swollen" but have been worse over the past few days. She is prescribed Lasix 63m (2 in the morning and 1 tablet in the evening) but says she misses her afternoon dose at times. Her baseline weight according to the patient is 274lbs. She reports good compliance with her other medications.   She was last seen in the office by Dr. CSallyanne Kusterin March 2016 and was doing well at that time. Since then, she reports having a few episodes of chest pain in July which were relieved with the administration of 1 SL NTG.   Her last Echo was on 04/06/12 and showed EF of 55-60% and Grade 1  Diastolic Dysfunction.  Problem List  Past Medical History  Diagnosis Date  . Diabetes mellitus   . Hypertension   . Hyperlipemia   . Coronary artery disease     Medical Rx  . GERD (gastroesophageal reflux disease)   . Morbid obesity with BMI of 40.0-44.9, adult   . Anemia   . Obstructive sleep apnea     no sleep study  . Diabetic retinopathy   . Chronic diastolic heart failure 97/20   grade 1 by echo, EF 55-60%    Past Surgical History  Procedure Laterality Date  . Cardiac catheterization  July 2010  . Incision and drainage perirectal abscess  04/03/2012  . Groin abscess removed      left  . Partial hysterectomy  1981      Home Medications  Prior to Admission medications   Medication Sig Start Date End Date Taking? Authorizing Provider  acetaminophen (TYLENOL) 500 MG tablet Take 500 mg by mouth every 6 (six) hours as needed. For pain   Yes Historical Provider, MD  acetaZOLAMIDE (DIAMOX) 500 MG capsule Take 500 mg by mouth 2 (two) times daily.   Yes Historical Provider, MD  aspirin 81 MG tablet Take 81 mg by mouth daily.   Yes Historical Provider, MD  baclofen (LIORESAL) 10 MG tablet Take 1 tablet (10 mg total) by mouth at bedtime as needed for muscle spasms. 01/13/15  Yes DLorayne Marek MD  bismuth subsalicylate (PEPTO  BISMOL) 262 MG/15ML suspension Take 30 mLs by mouth every 6 (six) hours as needed.   Yes Historical Provider, MD  brimonidine-timolol (COMBIGAN) 0.2-0.5 % ophthalmic solution Place 1 drop into both eyes every 12 (twelve) hours.   Yes Historical Provider, MD  diltiazem (DILACOR XR) 240 MG 24 hr capsule TAKE ONE CAPSULE BY MOUTH ONCE DAILY 02/25/15  Yes Mihai Croitoru, MD  ferrous sulfate 325 (65 FE) MG tablet Take 325 mg by mouth daily with breakfast.   Yes Historical Provider, MD  furosemide (LASIX) 40 MG tablet Take 2 tablets in the morning and 1 tablet in the afternoon. 03/22/14  Yes Mihai Croitoru, MD  hydrALAZINE (APRESOLINE) 50 MG tablet Take 1 tablet (50 mg  total) by mouth 2 (two) times daily. 01/13/15  Yes Deepak Advani, MD  hydrocortisone cream 1 % Apply 1 application topically daily as needed for itching.   Yes Historical Provider, MD  insulin lispro protamine-lispro (HUMALOG MIX 75/25) (75-25) 100 UNIT/ML SUSP injection Inject 65 Units into the skin 2 (two) times daily with a meal. 01/13/15  Yes Deepak Advani, MD  Insulin Syringe-Needle U-100 (RELION INSULIN SYR 1CC/30G) 30G X 5/16" 1 ML MISC USE AS DIRECTED 01/13/15  Yes Deepak Advani, MD  losartan-hydrochlorothiazide (HYZAAR) 100-25 MG per tablet Take 1 tablet by mouth daily. 01/13/15  Yes Deepak Advani, MD  LUMIGAN 0.01 % SOLN Place 1 drop into both eyes 2 (two) times daily. 03/06/14  Yes Historical Provider, MD  Menthol-Methyl Salicylate (MUSCLE RUB EX) Apply 1 application topically daily as needed (pain).   Yes Historical Provider, MD  metoprolol (TOPROL-XL) 200 MG 24 hr tablet TAKE ONE TABLET BY MOUTH ONCE DAILY 11/27/14  Yes Mihai Croitoru, MD  NITROSTAT 0.4 MG SL tablet DISSOLVE ONE TABLET UNDER THE TONGUE EVERY 5 MINUTES AS NEEDED FOR CHEST PAIN.  DO NOT EXCEED A TOTAL OF 3 DOSES IN 15 MINUTES 03/10/15  Yes Mihai Croitoru, MD  pantoprazole (PROTONIX) 40 MG tablet Take 1 tablet (40 mg total) by mouth daily. 01/13/15  Yes Deepak Advani, MD  polyethylene glycol (MIRALAX / GLYCOLAX) packet Take 17 g by mouth daily as needed for mild constipation.   Yes Historical Provider, MD  polyvinyl alcohol (LIQUIFILM TEARS) 1.4 % ophthalmic solution Place 1 drop into both eyes daily as needed. For dry eyes   Yes Historical Provider, MD  potassium chloride (KLOR-CON M10) 10 MEQ tablet Take 3 tablets (30 mEq total) by mouth 2 (two) times daily. 01/13/15  Yes Deepak Advani, MD  RANEXA 500 MG 12 hr tablet TAKE ONE TABLET BY MOUTH TWICE DAILY 11/27/14  Yes Mihai Croitoru, MD  RESTASIS 0.05 % ophthalmic emulsion Place 1 drop into both eyes 2 (two) times daily. 03/07/14  Yes Historical Provider, MD  VYTORIN 10-20 MG per tablet  TAKE ONE TABLET BY MOUTH ONCE DAILY 02/25/15  Yes Mihai Croitoru, MD  glucose blood (FREESTYLE LITE) test strip Use as instructed 04/16/13   Olugbemiga E Jegede, MD  glucose monitoring kit (FREESTYLE) monitoring kit 1 each by Does not apply route 4 (four) times daily - after meals and at bedtime. 1 month Diabetic Testing Supplies for QAC-QHS accuchecks. Test up to 4 times daily E11.9 07/19/14   Deepak Advani, MD    Allergies Allergies  Allergen Reactions  . Lorazepam Anaphylaxis  . Orange Fruit [Citrus] Anaphylaxis    Pt stated throat swelling, itching    Past Medical History Past Medical History  Diagnosis Date  . Diabetes mellitus   . Hypertension   .   Hyperlipemia   . Coronary artery disease     Medical Rx  . GERD (gastroesophageal reflux disease)   . Morbid obesity with BMI of 40.0-44.9, adult   . Anemia   . Obstructive sleep apnea     no sleep study  . Diabetic retinopathy   . Chronic diastolic heart failure 9/13    grade 1 by echo, EF 55-60%     Surgical History: Past Surgical History  Procedure Laterality Date  . Cardiac catheterization  July 2010  . Incision and drainage perirectal abscess  04/03/2012  . Groin abscess removed      left  . Partial hysterectomy  1981     Family History: Family History  Problem Relation Age of Onset  . Colon polyps Father   . Diabetes Mother   . Diabetes Father   . Diabetes Brother   . Diabetes Sister   . Heart disease Father   . Heart disease Brother   . Heart disease Sister   . Heart disease Mother     Social History: Social History   Social History  . Marital Status: Married    Spouse Name: N/A  . Number of Children: 0  . Years of Education: N/A   Occupational History  . Not on file.   Social History Main Topics  . Smoking status: Never Smoker   . Smokeless tobacco: Never Used  . Alcohol Use: No  . Drug Use: No  . Sexual Activity: Not on file   Other Topics Concern  . Not on file   Social History  Narrative     Review of Systems General:  No chills, fever, night sweats or weight changes.  Cardiovascular:  Positive for chest pain, dyspnea on exertion, edema, orthopnea. Denies palpitations and paroxysmal nocturnal dyspnea. Dermatological: No rash, lesions/masses Respiratory: Positive for cough and dyspnea Urologic: No hematuria, dysuria Abdominal:   Positive for nausea. Denies vomiting, diarrhea, bright red blood per rectum, melena, or hematemesis Neurologic:  No visual changes, wkns, changes in mental status. All other systems reviewed and are otherwise negative except as noted above.   Physical Exam: Blood pressure 173/84, pulse 76, temperature 99.3 F (37.4 C), temperature source Oral, resp. rate 17, last menstrual period 04/02/2012, SpO2 100 %.  General: Obese ,female in no acute distress. Head: Normocephalic, atraumatic, sclera non-icteric, no xanthomas, nares are without discharge.  Neck: No carotid bruits. JVD not elevated.  Lungs: Respirations regular and unlabored, without wheezing or rales.  Heart: Regular rate and rhythm. No S3 or S4.  No murmur, no rubs, or gallops appreciated. Abdomen: Soft, non-tender, non-distended with normoactive bowel sounds. No hepatomegaly. No rebound/guarding. No obvious abdominal masses. Msk:  Strength and tone appear normal for age. No joint deformities or effusions. Extremities: No clubbing or cyanosis. 2+ pitting edema up to mid-shins bilaterally.  Distal pedal pulses are 2+ bilaterally. Neuro: Alert and oriented X 3. Moves all extremities spontaneously. No focal deficits noted. Psych:  Responds to questions appropriately with a normal affect. Skin: No rashes or lesions noted   Labs: CBC: 03/18/15 1129 WBC: 12.53 RBC: 4.18 HCT: 38.5 MCV 92.1 MCH: 30.4 MCHC: 33.0 PLT: 253  CMP: 03/18/15 1229 NA: 136 K: 4.0 CL: 105 CO2: 21 CALCIUM: 8.7 ALBUMIN: 3.0 BUN: 14 CREATININE: 1.21 GLUCOSE: 358 ALP: 53 ALT: 12 AST: 15  LIP:  21 TBIL: 0.3  TROPONIN I: 03/18/15 1229:    0.02  ECG: Sinus rhythm with rate in 80's. T-wave abnormalities in anterior leads, consistent with previous   EKG findings.  Radiology/Studies: Dg Chest 2 View:03/18/2015   CLINICAL DATA:  Chest pain starting last night  EXAM: CHEST  2 VIEW  COMPARISON:  04/03/2012  FINDINGS: Cardiomegaly is noted. No acute infiltrate or pleural effusion. No pulmonary edema. Mild degenerative changes thoracic spine.  IMPRESSION: No active cardiopulmonary disease.  Cardiomegaly.   Electronically Signed   By: Liviu  Pop M.D.   On: 03/18/2015 10:08    ASSESSMENT AND PLAN:  1. Unstable Angina - Initial Troponin negative at 0.02 and EKG without acute changes. - Consider cycling troponin - Continue ASA, statin. - Not sure of benefit of obtaining Myoview or Cardiac Catheterization due to patient already not being a candidate after being evaluated by [facility] following her last cath in 2010. - Discussed with MD. Plan is to admit overnight for observation. Will trend troponin. Will add high dose statin and hold Vytorin. Will check with patient about any statin complications in the past. Will anticoagulate with Heparin. Will re-assess need for cath tomorrow.  2. Hx of CAD - last cath was in 2010 and showed diffuse right coronary, circumflex, and first diagonal disease. She was evaluated by [facility] and found to not be a surgical candidate due to poor targets. Has been medically managed since. - home therapy included ASA, Diltiazem, Lasix, Hydralazine, Losartan-HCTZ, Metoprolol, SL NTG, Ranexa and Vytorin  3. Diastolic HF - last Echo was on 04/06/12 and showed EF of 55-60% and Grade 1 Diastolic Dysfunction. - consider obtaining new Echo - BNP already pending  4. HTN - BP has been elevated to 143/69 - 199/96 while in the ED  5. HLD - last lipid panel obtained 11/14/14 with LDL of 44 - continue statin therapy   Signed, Elizabeth Manning, PA-C 03/18/2015, 1:57 PM  As above,  patient seen and examined. Briefly she is a 59-year-old female with past medical history of coronary artery disease, diastolic congestive heart failure, obstructive sleep apnea, diabetes mellitus, hypertension, hyperlipidemia presenting with chest pain. The patient's last cardiac catheterization was performed in 2010. At that time she had diffuse disease in her right coronary, circumflex and first diagonal. The vessels were small and not amenable to PCI. She was seen by cardiothoracic surgery and given poor targets she was also felt not to be a candidate for coronary artery bypass graft. She has been treated medically. Patient states that she has chest pain approximately once per month. However her symptoms typically improve with sublingual nitroglycerin x2. She developed a squeezing chest pain radiating to her back and left upper extremity at approximately 3 AM. The pain was not pleuritic, positional or related to food. There was associated nausea, vomiting and dyspnea. On arrival to the hospital there some improvement in her chest pain. Patient states this is similar to her previous infarct pain. Electrocardiogram shows sinus rhythm, incomplete right bundle branch block and nonspecific ST changes.  Difficult situation. Previous catheterization showed severe diffusely diseased vessels and she was felt not to be a candidate for PCI or coronary artery bypass graft at that time. Her pain is much improved. Electrocardiogram shows no diagnostic ST changes. Plan to admit and rule out myocardial infarction with serial enzymes. Treat with aspirin and continue beta blocker, Cardizem and ranexa. Add IV heparin for 48 hours. Add Plavix 75 mg daily. Add Imdur 60 mg daily. If she rules out plan medical therapy with close follow-up with Dr Croitoru. If she rules in would favor catheterization to see if she's developed LAD disease or lesion amenable to PCI.   Continue Lasix but increase to 80 mg twice a day. Repeat echocardiogram  to assess LV function. Follow renal function. Change vytorin to lipitor 80 mg daily.       

## 2015-03-18 NOTE — ED Notes (Signed)
MD made aware of patient's BP  

## 2015-03-18 NOTE — ED Notes (Signed)
Cardiology at the bedside.

## 2015-03-18 NOTE — ED Notes (Signed)
IV start attempted by 2 RN. Dr. Tyrone Nine aware, [name] IV set up at bedside for EDP to attempt.

## 2015-03-18 NOTE — Progress Notes (Addendum)
ANTICOAGULATION CONSULT NOTE - Initial Consult  Pharmacy Consult for heparin Indication: chest pain/ACS  Allergies  Allergen Reactions  . Lorazepam Anaphylaxis  . Orange Fruit [Citrus] Anaphylaxis    Pt stated throat swelling, itching    Patient Measurements: Height: 5\' 3"  (160 cm) Weight: 270 lb 15.1 oz (122.9 kg) (per past admission) IBW/kg (Calculated) : 52.4 Heparin Dosing Weight: 82.7kg  Vital Signs: Temp: 99.3 F (37.4 C) (08/16 0921) Temp Source: Oral (08/16 0921) BP: 192/92 mmHg (08/16 1430) Pulse Rate: 80 (08/16 1430)  Labs:  Recent Labs  03/18/15 1514  HGB 12.7  HCT 38.5  PLT 253  CREATININE 1.21*  TROPONINI <0.03    Estimated Creatinine Clearance: 62.9 mL/min (by C-G formula based on Cr of 1.21).   Medical History: Past Medical History  Diagnosis Date  . Diabetes mellitus   . Hypertension   . Hyperlipemia   . Coronary artery disease     Medical Rx  . GERD (gastroesophageal reflux disease)   . Morbid obesity with BMI of 40.0-44.9, adult   . Anemia   . Obstructive sleep apnea     no sleep study  . Diabetic retinopathy   . Chronic diastolic heart failure [id number]    grade 1 by echo, EF 55-60%    Medications:  Infusions:  . heparin      Assessment: 39 yof presented to the ED with CP. To start IV heparin for anticoagulation. Baseline H/H + platelets are WNL. She is not on anticoagulation PTA.   Goal of Therapy:  Heparin level 0.3-0.7 units/ml Monitor platelets by anticoagulation protocol: Yes   Plan:  - Heparin bolus 4000 units IV x 1 - Heparin gtt 1000 units/hr - Check a 6 hour heparin level - Daily heparin level and CBC  Rumbarger, Rande Lawman 03/18/2015,3:22 PM  PM Heparin level = 0.26  Plan: Increase heparin to 1200 units / hr Follow up AM labs  Thank you Anette Guarneri, PharmD [phone] 10:45 pm

## 2015-03-18 NOTE — ED Provider Notes (Signed)
CSN: [id number]     Arrival date & time 03/18/15  0909 History   First MD Initiated Contact with Patient 03/18/15 0914     Chief Complaint  Patient presents with  . Chest Pain     (Consider location/radiation/quality/duration/timing/severity/associated sxs/prior Treatment) Patient is a 59 y.o. female presenting with chest pain. The history is provided by the patient.  Chest Pain Pain location:  Substernal area Pain quality: aching, pressure and throbbing   Pain radiates to:  Does not radiate Pain radiates to the back: no   Pain severity:  Moderate Onset quality:  Gradual Duration:  2 days Timing:  Constant Progression:  Worsening Chronicity:  Recurrent Relieved by:  Nitroglycerin and rest Worsened by:  Certain positions and movement Ineffective treatments:  None tried Associated symptoms: cough, nausea and vomiting   Associated symptoms: no dizziness, no fever, no headache, no palpitations and no shortness of breath   Risk factors: coronary artery disease, diabetes mellitus, high cholesterol and hypertension   Risk factors: no smoking    59 yo F with a significant past medical history of diffuse coronary disease comes in with a chief complaint of chest pain. Started last night around 11:00. Feels like her typical coronary disease. Improved partially with nitroglycerin. Patient states she's also been coughing having increased lower external edema. Sleeping on 5 pillows but feels like that unchanged. Patient has not been taking her Lasix as prescribed. Denies fevers or chills. Old records reviewed patient with significant coronary disease not amenable to stenting or bypass.  Past Medical History  Diagnosis Date  . Diabetes mellitus   . Hypertension   . Hyperlipemia   . Coronary artery disease     Medical Rx  . GERD (gastroesophageal reflux disease)   . Morbid obesity with BMI of 40.0-44.9, adult   . Anemia   . Obstructive sleep apnea     no sleep study  . Diabetic  retinopathy   . Chronic diastolic heart failure 4/68    grade 1 by echo, EF 55-60%   Past Surgical History  Procedure Laterality Date  . Cardiac catheterization  July 2010  . Incision and drainage perirectal abscess  04/03/2012  . Groin abscess removed      left  . Partial hysterectomy  1981   Family History  Problem Relation Age of Onset  . Colon polyps Father   . Diabetes Mother   . Diabetes Father   . Diabetes Brother   . Diabetes Sister   . Heart disease Father   . Heart disease Brother   . Heart disease Sister   . Heart disease Mother    Social History  Substance Use Topics  . Smoking status: Never Smoker   . Smokeless tobacco: Never Used  . Alcohol Use: No   OB History    No data available     Review of Systems  Constitutional: Negative for fever and chills.  HENT: Negative for congestion and rhinorrhea.   Eyes: Negative for redness and visual disturbance.  Respiratory: Positive for cough. Negative for shortness of breath and wheezing.   Cardiovascular: Positive for chest pain. Negative for palpitations.  Gastrointestinal: Positive for nausea and vomiting.  Genitourinary: Negative for dysuria and urgency.  Musculoskeletal: Negative for myalgias and arthralgias.  Skin: Negative for pallor and wound.  Neurological: Negative for dizziness and headaches.      Allergies  Lorazepam and Orange fruit  Home Medications   Prior to Admission medications   Medication Sig Start Date End  Date Taking? Authorizing Provider  acetaminophen (TYLENOL) 500 MG tablet Take 500 mg by mouth every 6 (six) hours as needed. For pain   Yes Historical Provider, MD  acetaZOLAMIDE (DIAMOX) 500 MG capsule Take 500 mg by mouth 2 (two) times daily.   Yes Historical Provider, MD  aspirin 81 MG tablet Take 81 mg by mouth daily.   Yes Historical Provider, MD  baclofen (LIORESAL) 10 MG tablet Take 1 tablet (10 mg total) by mouth at bedtime as needed for muscle spasms. 01/13/15  Yes Lorayne Marek, MD  bismuth subsalicylate (PEPTO BISMOL) 262 MG/15ML suspension Take 30 mLs by mouth every 6 (six) hours as needed.   Yes Historical Provider, MD  brimonidine-timolol (COMBIGAN) 0.2-0.5 % ophthalmic solution Place 1 drop into both eyes every 12 (twelve) hours.   Yes Historical Provider, MD  diltiazem (DILACOR XR) 240 MG 24 hr capsule TAKE ONE CAPSULE BY MOUTH ONCE DAILY 02/25/15  Yes Mihai Croitoru, MD  ferrous sulfate 325 (65 FE) MG tablet Take 325 mg by mouth daily with breakfast.   Yes Historical Provider, MD  furosemide (LASIX) 40 MG tablet Take 2 tablets in the morning and 1 tablet in the afternoon. 03/22/14  Yes Mihai Croitoru, MD  hydrALAZINE (APRESOLINE) 50 MG tablet Take 1 tablet (50 mg total) by mouth 2 (two) times daily. 01/13/15  Yes Lorayne Marek, MD  hydrocortisone cream 1 % Apply 1 application topically daily as needed for itching.   Yes Historical Provider, MD  insulin lispro protamine-lispro (HUMALOG MIX 75/25) (75-25) 100 UNIT/ML SUSP injection Inject 65 Units into the skin 2 (two) times daily with a meal. 01/13/15  Yes Deepak Advani, MD  Insulin Syringe-Needle U-100 (RELION INSULIN SYR 1CC/30G) 30G X 5/16" 1 ML MISC USE AS DIRECTED 01/13/15  Yes Deepak Advani, MD  losartan-hydrochlorothiazide (HYZAAR) 100-25 MG per tablet Take 1 tablet by mouth daily. 01/13/15  Yes Deepak Advani, MD  LUMIGAN 0.01 % SOLN Place 1 drop into both eyes 2 (two) times daily. 03/06/14  Yes Historical Provider, MD  Menthol-Methyl Salicylate (MUSCLE RUB EX) Apply 1 application topically daily as needed (pain).   Yes Historical Provider, MD  metoprolol (TOPROL-XL) 200 MG 24 hr tablet TAKE ONE TABLET BY MOUTH ONCE DAILY 11/27/14  Yes Mihai Croitoru, MD  NITROSTAT 0.4 MG SL tablet DISSOLVE ONE TABLET UNDER THE TONGUE EVERY 5 MINUTES AS NEEDED FOR CHEST PAIN.  DO NOT EXCEED A TOTAL OF 3 DOSES IN 15 MINUTES 03/10/15  Yes Mihai Croitoru, MD  pantoprazole (PROTONIX) 40 MG tablet Take 1 tablet (40 mg total) by mouth  daily. 01/13/15  Yes Lorayne Marek, MD  polyethylene glycol (MIRALAX / GLYCOLAX) packet Take 17 g by mouth daily as needed for mild constipation.   Yes Historical Provider, MD  polyvinyl alcohol (LIQUIFILM TEARS) 1.4 % ophthalmic solution Place 1 drop into both eyes daily as needed. For dry eyes   Yes Historical Provider, MD  potassium chloride (KLOR-CON M10) 10 MEQ tablet Take 3 tablets (30 mEq total) by mouth 2 (two) times daily. 01/13/15  Yes Deepak Advani, MD  RANEXA 500 MG 12 hr tablet TAKE ONE TABLET BY MOUTH TWICE DAILY 11/27/14  Yes Mihai Croitoru, MD  RESTASIS 0.05 % ophthalmic emulsion Place 1 drop into both eyes 2 (two) times daily. 03/07/14  Yes Historical Provider, MD  VYTORIN 10-20 MG per tablet TAKE ONE TABLET BY MOUTH ONCE DAILY 02/25/15  Yes Mihai Croitoru, MD  glucose blood (FREESTYLE LITE) test strip Use as instructed 04/16/13  Tresa Garter, MD  glucose monitoring kit (FREESTYLE) monitoring kit 1 each by Does not apply route 4 (four) times daily - after meals and at bedtime. 1 month Diabetic Testing Supplies for QAC-QHS accuchecks. Test up to 4 times daily E11.9 07/19/14   Lorayne Marek, MD   BP 179/83 mmHg  Pulse 81  Temp(Src) 99.3 F (37.4 C) (Oral)  Resp 14  Ht '5\' 3"'  (1.6 m)  Wt 270 lb 15.1 oz (122.9 kg)  BMI 48.01 kg/m2  SpO2 100%  LMP 04/02/2012 Physical Exam  Constitutional: She is oriented to person, place, and time. She appears well-developed and well-nourished. No distress.  Obese   HENT:  Head: Normocephalic and atraumatic.  Eyes: EOM are normal. Pupils are equal, round, and reactive to light.  Neck: Normal range of motion. Neck supple.  Cardiovascular: Normal rate and regular rhythm.  Exam reveals no gallop and no friction rub.   No murmur heard. Pulmonary/Chest: Effort normal. She has no wheezes. She has no rales.  Abdominal: Soft. She exhibits no distension. There is no tenderness. There is no rebound and no guarding.  Musculoskeletal: She exhibits  edema (3+ to the thighs). She exhibits no tenderness.  Neurological: She is alert and oriented to person, place, and time.  Skin: Skin is warm and dry. She is not diaphoretic.  Psychiatric: She has a normal mood and affect. Her behavior is normal.     ED Course  Procedures (including critical care time) Labs Review Labs Reviewed  CBC WITH DIFFERENTIAL/PLATELET - Abnormal; Notable for the following:    WBC 12.5 (*)    Neutrophils Relative % 84 (*)    Neutro Abs 10.5 (*)    All other components within normal limits  COMPREHENSIVE METABOLIC PANEL - Abnormal; Notable for the following:    CO2 21 (*)    Glucose, Bld 358 (*)    Creatinine, Ser 1.21 (*)    Calcium 8.7 (*)    Albumin 3.0 (*)    ALT 12 (*)    GFR calc non Af Amer 48 (*)    GFR calc Af Amer 55 (*)    All other components within normal limits  LIPASE, BLOOD - Abnormal; Notable for the following:    Lipase 21 (*)    All other components within normal limits  TROPONIN I  BRAIN NATRIURETIC PEPTIDE  HEPARIN LEVEL (UNFRACTIONATED)  I-STAT TROPOININ, ED    Imaging Review Dg Chest 2 View  03/18/2015   CLINICAL DATA:  Chest pain starting last night  EXAM: CHEST  2 VIEW  COMPARISON:  04/03/2012  FINDINGS: Cardiomegaly is noted. No acute infiltrate or pleural effusion. No pulmonary edema. Mild degenerative changes thoracic spine.  IMPRESSION: No active cardiopulmonary disease.  Cardiomegaly.   Electronically Signed   By: Lahoma Crocker M.D.   On: 03/18/2015 10:08   I, Cecilio Asper, personally reviewed and evaluated these images and lab results as part of my medical decision-making.   EKG Interpretation   Date/Time:  Tuesday March 18 2015 09:18:55 EDT Ventricular Rate:  80 PR Interval:  168 QRS Duration: 106 QT Interval:  389 QTC Calculation: 449 R Axis:   14 Text Interpretation:  Sinus rhythm Low voltage, precordial leads RSR' in  V1 or V2, right VCD or RVH Borderline T abnormalities, anterior leads No   significant change was found Confirmed by Braedin Millhouse MD, DANIEL ([id number]) on  03/18/2015 11:29:22 AM      Procedure note: Ultrasound Guided Peripheral IV Ultrasound guided 18  g peripheral 1.88 inch angiocath IV placement performed by me. Indications: Nursing unable to place IV. Details: The Right antecubital fossa and upper arm was evaluated with a multifrequency linear probe. Several patent brachial veins are noted. 1 attempts were made to cannulate a Right vein under realtime US guidance with successful cannulation of the vein and catheter placement. There is return of non-pulsatile dark red blood. The patient tolerated the procedure well without complications. An ultrasound image is archived. MDM   Final diagnoses:  Unstable angina    59 yo F with diffuse coronary disease comes in with uncontrolled chest pain. Improved with nitroglycerin. Will obtain troponin labs EKG discussed case with cardiology.  Cardiology limited to the hospital. Patient with continued pain after nitroglycerin though improved.  Given morphine with even more improvement.  The patients results and plan were reviewed and discussed.   Any x-rays performed were independently reviewed by myself.   Differential diagnosis were considered with the presenting HPI.  Medications  clopidogrel (PLAVIX) tablet 75 mg (75 mg Oral Given 03/18/15 1544)  isosorbide mononitrate (IMDUR) 24 hr tablet 60 mg (60 mg Oral Given 03/18/15 1544)  heparin ADULT infusion 100 units/mL (25000 units/250 mL) (1,000 Units/hr Intravenous New Bag/Given 03/18/15 1545)  aspirin chewable tablet 324 mg (324 mg Oral Given 03/18/15 0933)  nitroGLYCERIN (NITROSTAT) SL tablet 0.4 mg (0.4 mg Sublingual Given 03/18/15 1056)  furosemide (LASIX) injection 40 mg (40 mg Intravenous Given 03/18/15 1308)  morphine 2 MG/ML injection 2 mg (2 mg Intravenous Given 03/18/15 1310)  ondansetron (ZOFRAN) injection 4 mg (4 mg Intravenous Given 03/18/15 1405)  heparin bolus via  infusion 4,000 Units (4,000 Units Intravenous Given 03/18/15 1550)    Filed Vitals:   03/18/15 1345 03/18/15 1430 03/18/15 1500 03/18/15 1525  BP: 173/84 192/92  179/83  Pulse: 76 80  81  Temp:      TempSrc:      Resp: '17 14  14  ' Height:   '5\' 3"'  (1.6 m)   Weight:   270 lb 15.1 oz (122.9 kg)   SpO2: 100% 100%  100%    Final diagnoses:  Unstable angina    Admission/ observation were discussed with the admitting physician, patient and/or family and they are comfortable with the plan.    Deno Etienne, DO 03/18/15 1553

## 2015-03-18 NOTE — ED Notes (Signed)
MD given lab results.

## 2015-03-19 ENCOUNTER — Observation Stay (HOSPITAL_COMMUNITY): Payer: Medicare Other

## 2015-03-19 DIAGNOSIS — N189 Chronic kidney disease, unspecified: Secondary | ICD-10-CM

## 2015-03-19 DIAGNOSIS — N183 Chronic kidney disease, stage 3 unspecified: Secondary | ICD-10-CM | POA: Diagnosis present

## 2015-03-19 DIAGNOSIS — I10 Essential (primary) hypertension: Secondary | ICD-10-CM

## 2015-03-19 DIAGNOSIS — I5032 Chronic diastolic (congestive) heart failure: Secondary | ICD-10-CM

## 2015-03-19 DIAGNOSIS — E669 Obesity, unspecified: Secondary | ICD-10-CM

## 2015-03-19 DIAGNOSIS — I25110 Atherosclerotic heart disease of native coronary artery with unstable angina pectoris: Secondary | ICD-10-CM | POA: Diagnosis not present

## 2015-03-19 DIAGNOSIS — E1122 Type 2 diabetes mellitus with diabetic chronic kidney disease: Secondary | ICD-10-CM | POA: Diagnosis not present

## 2015-03-19 DIAGNOSIS — R079 Chest pain, unspecified: Secondary | ICD-10-CM

## 2015-03-19 DIAGNOSIS — N179 Acute kidney failure, unspecified: Secondary | ICD-10-CM | POA: Diagnosis not present

## 2015-03-19 DIAGNOSIS — I200 Unstable angina: Secondary | ICD-10-CM | POA: Diagnosis not present

## 2015-03-19 DIAGNOSIS — I130 Hypertensive heart and chronic kidney disease with heart failure and stage 1 through stage 4 chronic kidney disease, or unspecified chronic kidney disease: Secondary | ICD-10-CM | POA: Diagnosis not present

## 2015-03-19 LAB — CBC
HEMATOCRIT: 36 % (ref 36.0–46.0)
HEMOGLOBIN: 12 g/dL (ref 12.0–15.0)
MCH: 30.5 pg (ref 26.0–34.0)
MCHC: 33.3 g/dL (ref 30.0–36.0)
MCV: 91.4 fL (ref 78.0–100.0)
Platelets: 246 10*3/uL (ref 150–400)
RBC: 3.94 MIL/uL (ref 3.87–5.11)
RDW: 13.2 % (ref 11.5–15.5)
WBC: 11.1 10*3/uL — ABNORMAL HIGH (ref 4.0–10.5)

## 2015-03-19 LAB — TROPONIN I: Troponin I: <0.03 ng/mL (ref <0.031)

## 2015-03-19 LAB — BASIC METABOLIC PANEL
ANION GAP: 9 (ref 5–15)
BUN: 14 mg/dL (ref 6–20)
CALCIUM: 8.4 mg/dL — AB (ref 8.9–10.3)
CO2: 22 mmol/L (ref 22–32)
Chloride: 104 mmol/L (ref 101–111)
Creatinine, Ser: 1.22 mg/dL — ABNORMAL HIGH (ref 0.44–1.00)
GFR, EST AFRICAN AMERICAN: 55 mL/min — AB (ref >60)
GFR, EST NON AFRICAN AMERICAN: 47 mL/min — AB (ref >60)
Glucose, Bld: 79 mg/dL (ref 65–99)
Potassium: 3.5 mmol/L (ref 3.5–5.1)
SODIUM: 135 mmol/L (ref 135–145)

## 2015-03-19 LAB — GLUCOSE, CAPILLARY
GLUCOSE-CAPILLARY: 64 mg/dL — AB (ref 65–99)
GLUCOSE-CAPILLARY: 234 mg/dL — AB (ref 65–99)
GLUCOSE-CAPILLARY: 138 mg/dL — AB (ref 65–99)
Glucose-Capillary: 124 mg/dL — ABNORMAL HIGH (ref 65–99)
Glucose-Capillary: 173 mg/dL — ABNORMAL HIGH (ref 65–99)

## 2015-03-19 LAB — HEPARIN LEVEL (UNFRACTIONATED): HEPARIN UNFRACTIONATED: 0.44 [IU]/mL (ref 0.30–0.70)

## 2015-03-19 MED ORDER — HYDROCHLOROTHIAZIDE 25 MG PO TABS
25.0000 mg | ORAL_TABLET | Freq: Every day | ORAL | Status: DC
  Administered 2015-03-19: 25 mg via ORAL
  Filled 2015-03-19 (×3): qty 1

## 2015-03-19 MED ORDER — LOSARTAN POTASSIUM 50 MG PO TABS
100.0000 mg | ORAL_TABLET | Freq: Every day | ORAL | Status: DC
  Administered 2015-03-19: 100 mg via ORAL
  Filled 2015-03-19 (×2): qty 2

## 2015-03-19 MED ORDER — LOSARTAN POTASSIUM-HCTZ 100-25 MG PO TABS: 1.0000 | ORAL_TABLET | Freq: Every day | ORAL | Status: DC

## 2015-03-19 MED ORDER — FERROUS SULFATE 325 (65 FE) MG PO TABS
325.0000 mg | ORAL_TABLET | Freq: Every day | ORAL | Status: DC
  Administered 2015-03-19 – 2015-03-22 (×4): 325 mg via ORAL
  Filled 2015-03-19 (×5): qty 1

## 2015-03-19 NOTE — Progress Notes (Signed)
Pt is high fall risk due to being dizzy.  She has not had any falls, but we are being cautious due to the dizziness.

## 2015-03-19 NOTE — Progress Notes (Signed)
  Echocardiogram 2D Echocardiogram has been performed.  [name] 03/19/2015, 11:44 AM

## 2015-03-19 NOTE — Progress Notes (Signed)
Pt. With CBG of 68 this am. Pt. Requesting juice. Snack also given. Pt. Alert and stable. No distress noted. RN will continue to monitor pt.for changes in condition. Ishmael Berkovich, Katherine Roan

## 2015-03-19 NOTE — Progress Notes (Signed)
[place] for heparin Indication: chest pain/ACS  Allergies  Allergen Reactions  . Lorazepam Anaphylaxis  . Orange Fruit [Citrus] Anaphylaxis    Pt stated throat swelling, itching    Patient Measurements: Height: 5\' 3"  (160 cm) Weight: 271 lb 9.6 oz (123.197 kg) IBW/kg (Calculated) : 52.4 Heparin Dosing Weight: 82.7kg  Vital Signs: Temp: 98.2 F (36.8 C) (08/17 1003) Temp Source: Oral (08/17 1003) BP: 128/68 mmHg (08/17 1003) Pulse Rate: 60 (08/17 0627)  Labs:  Recent Labs  03/18/15 1514 03/18/15 1924 03/18/15 2200 03/18/15 2318 03/19/15 0600  HGB 12.7  --   --   --  12.0  HCT 38.5  --   --   --  36.0  PLT 253  --   --   --  246  HEPARINUNFRC  --   --  0.26*  --  0.44  CREATININE 1.21*  --   --   --  1.22*  TROPONINI <0.03 <0.03  --  <0.03 <0.03    Estimated Creatinine Clearance: 62.5 mL/min (by C-G formula based on Cr of 1.22).   Assessment:  59 yo F  on heparin for CP/ACS -  no AC PTA.   HL 0.44 on 1200 units/hr is therapeutic.  CBC stable, trop neg x 3.  No bleeding reported.    Goal of Therapy:  Heparin level 0.3-0.7 units/ml Monitor platelets by anticoagulation protocol: Yes   Plan:  - continue heparin drip at 1200 units/hr - Daily HL and CBC while on heparin  Eudelia Bunch, Pharm.D. [id number] 03/19/2015 10:31 AM

## 2015-03-19 NOTE — Progress Notes (Signed)
I came by to see how pt was doing off Heparin and on the new medications that were started on admission (Imdur, Plavix, Lipitor 80 mg). She denies chest pain. She would prefer to avoid cath unless we feel its necessary. I suggested she ambulate this pm and see how she does. We'll keep her NPO in the am in case she has pain overnight.   Kerin Ransom PA-C 03/19/2015 4:45 PM

## 2015-03-19 NOTE — Progress Notes (Signed)
Subjective:  No chest pain, concerned about her BS  Objective:  Vital Signs in the last 24 hours: Temp:  [98.1 F (36.7 C)-99.3 F (37.4 C)] 98.1 F (36.7 C) (08/17 0627) Pulse Rate:  [60-81] 60 (08/17 0627) Resp:  [14-22] 18 (08/17 0627) BP: (125-199)/(57-96) 125/57 mmHg (08/17 0627) SpO2:  [96 %-100 %] 97 % (08/17 0627) Weight:  [270 lb 15.1 oz (122.9 kg)-273 lb 11.2 oz (124.15 kg)] 271 lb 9.6 oz (123.197 kg) (08/17 0627)  Intake/Output from previous day:  Intake/Output Summary (Last 24 hours) at 03/19/15 0803 Last data filed at 03/19/15 0100  Gross per 24 hour  Intake  312.5 ml  Output   1200 ml  Net -887.5 ml    Physical Exam: General appearance: alert, cooperative, no distress and morbidly obese Lungs: clear to auscultation bilaterally Heart: regular rate and rhythm Skin: cool and dry Neurologic: Grossly normal   Rate: 64  Rhythm: normal sinus rhythm  Lab Results:  Recent Labs  03/18/15 1514 03/19/15 0600  WBC 12.5* 11.1*  HGB 12.7 12.0  PLT 253 246    Recent Labs  03/18/15 1514 03/19/15 0600  NA 136 135  K 4.0 3.5  CL 105 104  CO2 21* 22  GLUCOSE 358* 79  BUN 14 14  CREATININE 1.21* 1.22*    Recent Labs  03/18/15 2318 03/19/15 0600  TROPONINI <0.03 <0.03   No results for input(s): INR in the last 72 hours.  Scheduled Meds: . acetaZOLAMIDE  500 mg Oral BID  . aspirin  81 mg Oral Daily  . atorvastatin  80 mg Oral q1800  . brimonidine  1 drop Both Eyes BID   And  . timolol  1 drop Both Eyes BID  . clopidogrel  75 mg Oral Daily  . diltiazem  240 mg Oral Daily  . ferrous sulfate  325 mg Oral Q breakfast  . furosemide  80 mg Intravenous BID  . hydrALAZINE  50 mg Oral BID  . insulin aspart  0-15 Units Subcutaneous TID WC  . insulin aspart protamine- aspart  65 Units Subcutaneous BID WC  . isosorbide mononitrate  60 mg Oral Daily  . latanoprost  1 drop Both Eyes QHS  . metoprolol  200 mg Oral Daily  . pantoprazole  40 mg Oral  Daily  . potassium chloride  30 mEq Oral BID  . ranolazine  500 mg Oral BID   Continuous Infusions: . heparin 1,200 Units/hr (03/19/15 0048)   PRN Meds:.acetaminophen, baclofen, nitroGLYCERIN, ondansetron (ZOFRAN) IV, polyethylene glycol   Imaging: Dg Chest 2 View  03/18/2015   CLINICAL DATA:  Chest pain starting last night  EXAM: CHEST  2 VIEW  COMPARISON:  04/03/2012  FINDINGS: Cardiomegaly is noted. No acute infiltrate or pleural effusion. No pulmonary edema. Mild degenerative changes thoracic spine.  IMPRESSION: No active cardiopulmonary disease.  Cardiomegaly.   Electronically Signed   By: Lahoma Crocker M.D.   On: 03/18/2015 10:08    Cardiac Studies: Echo 04/06/12 Study Conclusions  - Study data: Very technically difficult study with suboptimal images for interpretation. - Left ventricle: There was severe concentric hypertrophy. Systolic function was normal. The estimated ejection fraction was in the range of 55% to 60%. Images were inadequate for LV wall motion assessment. Doppler parameters are consistent with abnormal left ventricular relaxation (grade 1 diastolic dysfunction). The E/e' ratio is >10, suggesting elevated lv filling pressure.   Assessment/Plan:  59 y.o. AA female with history of CAD (last cath 2010  showed diffuse RCA, CFX, and DX1 disease-turned down for CABG or PCI-  medically managed), diastolic HF, OSA, Type II DM, HLD, morbid obesity (BMI 48), and HTN. She was admitted 03/18/15 with increasing NTG responsive chest pain. She has ruled out for an MI. Treated with nitrates and IV Heparin.   Principal Problem:   Unstable angina Active Problems:   Coronary atherosclerosis-not a surgical or PCI candidate 2010   DM (diabetes mellitus), type 2, uncontrolled, with renal complications   Chronic diastolic heart failure, NYHA class 3   Chronic renal insufficiency, stage III (moderate)   Proliferative diabetic retinopathy   HTN, on multiple medications  prior to admission.   GERD   Obesity, morbid   Presumed OSA (obstructive sleep apnea)   Dyslipidemia   PLAN: Imdur and Plavix are new, statin Rx changed to Lipitor 80 mg. Doubt she will need cath with negative Troponin and no further chest pain.  Resume losartan (not HCTZ as she is on Lasix 80 mg BID and Diamox),    BS labile- 260-64, consider IM consult to help with DM.   Will discuss Heparin with MD- ? Continue 48 hrs.   Kerin Ransom PA-C 03/19/2015, 8:03 AM [phone]  I have examined the patient and reviewed assessment and plan and discussed with patient.  Agree with above as stated.  I personally reviewed her cath films from 2010.  SHe had an occluded RPDA.  Moderate disease in the LAD.  We discussed repeat cath vs. Medical therapy.  Could try to increase Imdur.  She will think about it.  Will discus with her later. I think that since her sx seems to be getting more frequent, it is reasonable to recath given that it has been 6 years since last eval. Stress test will not be helpful due to known CTO. Stop heparin at this point.   Finneus Kaneshiro S.

## 2015-03-19 NOTE — Progress Notes (Signed)
Chaplain responded to a consult request. Pt  was sitting up in a lounge chair. Chaplain asked if Pt had any needs. Pt said she has everything she needs including God as she points upward. Chaplain asked if she would except prayer. Pt was excited to receive prayers. Chaplain prayed with Pt . Pt was tearful. Chaplain let Pt know they are available for spiritual needs.    03/19/15 1000  Clinical Encounter Type  Visited With Patient  Visit Type Spiritual support  Referral From Nurse  Spiritual Encounters  Spiritual Needs Prayer

## 2015-03-19 NOTE — Progress Notes (Signed)
Page sent to Rounding team A concerning pts. CBG of 64. Pt. Alert and stable. No distress noted.  On coming RN made aware. Gonzalo Waymire, Katherine Roan

## 2015-03-19 NOTE — Progress Notes (Signed)
At 1530 introduced self to pt as incoming nurse 3-7p.  Call bell at reach.  Instructed to call for assistance.  Verbalized understanding.  Karie Kirks, Therapist, sports.

## 2015-03-19 NOTE — Progress Notes (Signed)
Inpatient Diabetes Program Recommendations  AACE/ADA: New Consensus Statement on Inpatient Glycemic Control (2013)  Target Ranges:  Prepandial:   less than 140 mg/dL      Peak postprandial:   less than 180 mg/dL (1-2 hours)      Critically ill patients:  140 - 180 mg/dL   Patient here with chest pain. Home dm regimen is 75/25 65 units bid. Patient ordered the same dosage using 70/30 started ac supper yesterday. Pt low in 60's fasting this am. Please consider a decrease in 70/30 to 50 units bid while here. May also want to decrease correction to sensitive tidwc and HS as ordered. Can titrate doses up as needed.  Thank you Rosita Kea, RN, MSN, CDE  Diabetes Inpatient Program Office: [phone] Pager: [phone] 8:00 am to 5:00 pm

## 2015-03-20 ENCOUNTER — Encounter (HOSPITAL_COMMUNITY): Payer: Self-pay | Admitting: Physician Assistant

## 2015-03-20 DIAGNOSIS — N189 Chronic kidney disease, unspecified: Secondary | ICD-10-CM | POA: Diagnosis not present

## 2015-03-20 DIAGNOSIS — I200 Unstable angina: Secondary | ICD-10-CM | POA: Diagnosis not present

## 2015-03-20 DIAGNOSIS — N183 Chronic kidney disease, stage 3 (moderate): Secondary | ICD-10-CM | POA: Diagnosis not present

## 2015-03-20 DIAGNOSIS — I130 Hypertensive heart and chronic kidney disease with heart failure and stage 1 through stage 4 chronic kidney disease, or unspecified chronic kidney disease: Secondary | ICD-10-CM | POA: Diagnosis not present

## 2015-03-20 DIAGNOSIS — E1122 Type 2 diabetes mellitus with diabetic chronic kidney disease: Secondary | ICD-10-CM | POA: Diagnosis not present

## 2015-03-20 DIAGNOSIS — E669 Obesity, unspecified: Secondary | ICD-10-CM | POA: Diagnosis not present

## 2015-03-20 DIAGNOSIS — I25110 Atherosclerotic heart disease of native coronary artery with unstable angina pectoris: Secondary | ICD-10-CM | POA: Diagnosis not present

## 2015-03-20 LAB — BASIC METABOLIC PANEL
Anion gap: 9 (ref 5–15)
BUN: 15 mg/dL (ref 6–20)
CALCIUM: 8.9 mg/dL (ref 8.9–10.3)
CHLORIDE: 106 mmol/L (ref 101–111)
CO2: 24 mmol/L (ref 22–32)
CREATININE: 1.76 mg/dL — AB (ref 0.44–1.00)
GFR calc Af Amer: 35 mL/min — ABNORMAL LOW (ref >60)
GFR calc non Af Amer: 30 mL/min — ABNORMAL LOW (ref >60)
Glucose, Bld: 104 mg/dL — ABNORMAL HIGH (ref 65–99)
Potassium: 3.7 mmol/L (ref 3.5–5.1)
SODIUM: 139 mmol/L (ref 135–145)

## 2015-03-20 LAB — GLUCOSE, CAPILLARY
GLUCOSE-CAPILLARY: 195 mg/dL — AB (ref 65–99)
GLUCOSE-CAPILLARY: 150 mg/dL — AB (ref 65–99)
Glucose-Capillary: 94 mg/dL (ref 65–99)
Glucose-Capillary: 104 mg/dL — ABNORMAL HIGH (ref 65–99)

## 2015-03-20 NOTE — Progress Notes (Signed)
Patient Name: [name] Date of Encounter: 03/20/2015  Principal Problem:   Unstable angina Active Problems:   Proliferative diabetic retinopathy   HTN, on multiple medications prior to admission.   Coronary atherosclerosis-not a surgical or PCI candidate 2010   GERD   Obesity, morbid   DM (diabetes mellitus), type 2, uncontrolled, with renal complications   Presumed OSA (obstructive sleep apnea)   Chronic diastolic heart failure, NYHA class 3   Dyslipidemia   Chronic renal insufficiency, stage III (moderate)  SUBJECTIVE  Still having mild substernal chest discomfort. No SOB or palpitation. Wants to discuss cath option with [name]. She is NPO.  CURRENT MEDS . acetaZOLAMIDE  500 mg Oral BID  . aspirin  81 mg Oral Daily  . atorvastatin  80 mg Oral q1800  . brimonidine  1 drop Both Eyes BID   And  . timolol  1 drop Both Eyes BID  . clopidogrel  75 mg Oral Daily  . diltiazem  240 mg Oral Daily  . ferrous sulfate  325 mg Oral Q breakfast  . furosemide  80 mg Intravenous BID  . hydrALAZINE  50 mg Oral BID  . hydrochlorothiazide  25 mg Oral Daily  . insulin aspart  0-15 Units Subcutaneous TID WC  . insulin aspart protamine- aspart  65 Units Subcutaneous BID WC  . isosorbide mononitrate  60 mg Oral Daily  . latanoprost  1 drop Both Eyes QHS  . losartan  100 mg Oral Daily  . metoprolol  200 mg Oral Daily  . pantoprazole  40 mg Oral Daily  . potassium chloride  30 mEq Oral BID  . ranolazine  500 mg Oral BID    OBJECTIVE  Filed Vitals:   03/19/15 1413 03/19/15 1809 03/19/15 2119 03/20/15 0706  BP: 129/69 110/57 118/52 121/55  Pulse: 65 56 64 97  Temp: 97.5 F (36.4 C)  98.3 F (36.8 C) 98.4 F (36.9 C)  TempSrc: Oral  Oral Oral  Resp: 18  19 18   Height:      Weight:    271 lb 4.8 oz (123.061 kg)  SpO2: 100%  99% 96%    Intake/Output Summary (Last 24 hours) at 03/20/15 0907 Last data filed at 03/20/15 0606  Gross per 24 hour  Intake    360 ml    Output   1151 ml  Net   -791 ml   Filed Weights   03/18/15 1757 03/19/15 0627 03/20/15 0706  Weight: 273 lb 11.2 oz (124.15 kg) 271 lb 9.6 oz (123.197 kg) 271 lb 4.8 oz (123.061 kg)    PHYSICAL EXAM  General: Pleasant, obese woman in NAD. Neuro: Alert and oriented X 3. Moves all extremities spontaneously. Psych: Normal affect. HEENT:  Normal  Neck: Supple without bruits or JVD. Lungs:  Resp regular and unlabored, CTA. Heart: RRR no s3, s4, or murmurs. Abdomen: Soft, non-tender, non-distended, BS + x 4.  Extremities: No clubbing, cyanosis or edema. DP/PT/Radials 2+ and equal bilaterally.  Accessory Clinical Findings  CBC  Recent Labs  03/18/15 1514 03/19/15 0600  WBC 12.5* 11.1*  NEUTROABS 10.5*  --   HGB 12.7 12.0  HCT 38.5 36.0  MCV 92.1 91.4  PLT 253 0000000   Basic Metabolic Panel  Recent Labs  03/19/15 0600 03/20/15 0336  NA 135 139  K 3.5 3.7  CL 104 106  CO2 22 24  GLUCOSE 79 104*  BUN 14 15  CREATININE 1.22* 1.76*  CALCIUM 8.4* 8.9  Liver Function Tests  Recent Labs  03/18/15 1514  AST 15  ALT 12*  ALKPHOS 53  BILITOT 0.3  PROT 6.9  ALBUMIN 3.0*    Recent Labs  03/18/15 1514  LIPASE 21*   Cardiac Enzymes  Recent Labs  03/18/15 1924 03/18/15 2318 03/19/15 0600  TROPONINI <0.03 <0.03 <0.03    TELE  NSR with PVCs.   Radiology/Studies  Dg Chest 2 View  03/18/2015   CLINICAL DATA:  Chest pain starting last night  EXAM: CHEST  2 VIEW  COMPARISON:  04/03/2012  FINDINGS: Cardiomegaly is noted. No acute infiltrate or pleural effusion. No pulmonary edema. Mild degenerative changes thoracic spine.  IMPRESSION: No active cardiopulmonary disease.  Cardiomegaly.   Electronically Signed   By: [name] M.D.   On: 03/18/2015 10:08   Echo 03/19/15 LV EF: 60% -  65%  ------------------------------------------------------------------- Indications:   Chest pain  786.51.  ------------------------------------------------------------------- History:  PMH:  Coronary artery disease. Risk factors: Obstructive sleep apnea. Hypertension. Diabetes mellitus. Dyslipidemia.  ------------------------------------------------------------------- Study Conclusions  - Left ventricle: The cavity size was normal. There was mild concentric hypertrophy. Systolic function was normal. The estimated ejection fraction was in the range of 60% to 65%. Wall motion was normal; there were no regional wall motion abnormalities. Features are consistent with a pseudonormal left ventricular filling pattern, with concomitant abnormal relaxation and increased filling pressure (grade 2 diastolic dysfunction). - Right ventricle: The cavity size was mildly dilated. Wall thickness was normal. - Pulmonary arteries: Systolic pressure was mildly increased. PA peak pressure: 36 mm Hg (S).  ASSESSMENT AND PLAN   59 y.o. AA female with history of CAD (last cath 2010 showed diffuse RCA, CFX, and DX1 disease-turned down for CABG or PCI- medically managed), diastolic HF, OSA, Type II DM, HLD, morbid obesity (BMI 48), and HTN. She was admitted 03/18/15 with increasing NTG responsive chest pain. She has ruled out for an MI. Treated with nitrates and IV Heparin.   1. Unstable angina - Still some chest discomfort. Continue Imdur, plavix, renexa  and statin. She will discuss cath option with [name] today. She is NPO.   2. Chronic diastolic heart failure, NYHA class 3 - Echo LV Ef of 60-65%, grade 2DD and  PA peak pressure: 36 mm Hg (S). - Appears euvolemic - Will hold losartin, IV lasix and HCTZ due to worsen Cr today to 1.76 from 1.22. Plan to resume once improves.   3. Chronic renal insufficiency, stage III (moderate) - As above  4. DM - SSI, might need IM consult or f/u with PCP after discharge.  Signed, Bhagat,Bhavinkumar PA-C Pager [phone]  I have  examined the patient and reviewed assessment and plan and discussed with patient.  Agree with above as stated.  No further chest pain. COntinue medical therapy.  Given increased Cr, would not consider cath at this time.  OK to resume diet.    Would likely d/c when renal function stabilizes and consider OP cath if sx recur.  [name] S.

## 2015-03-21 DIAGNOSIS — N179 Acute kidney failure, unspecified: Secondary | ICD-10-CM

## 2015-03-21 DIAGNOSIS — I200 Unstable angina: Secondary | ICD-10-CM | POA: Diagnosis not present

## 2015-03-21 LAB — BASIC METABOLIC PANEL
ANION GAP: 10 (ref 5–15)
BUN: 19 mg/dL (ref 6–20)
CO2: 22 mmol/L (ref 22–32)
Calcium: 8.4 mg/dL — ABNORMAL LOW (ref 8.9–10.3)
Chloride: 107 mmol/L (ref 101–111)
Creatinine, Ser: 1.87 mg/dL — ABNORMAL HIGH (ref 0.44–1.00)
GFR calc Af Amer: 33 mL/min — ABNORMAL LOW (ref >60)
GFR, EST NON AFRICAN AMERICAN: 28 mL/min — AB (ref >60)
Glucose, Bld: 79 mg/dL (ref 65–99)
POTASSIUM: 3.8 mmol/L (ref 3.5–5.1)
SODIUM: 139 mmol/L (ref 135–145)

## 2015-03-21 LAB — GLUCOSE, CAPILLARY
GLUCOSE-CAPILLARY: 164 mg/dL — AB (ref 65–99)
GLUCOSE-CAPILLARY: 99 mg/dL (ref 65–99)
Glucose-Capillary: 84 mg/dL (ref 65–99)
Glucose-Capillary: 189 mg/dL — ABNORMAL HIGH (ref 65–99)
Glucose-Capillary: 88 mg/dL (ref 65–99)

## 2015-03-21 MED ORDER — INSULIN ASPART PROT & ASPART (70-30 MIX) 100 UNIT/ML ~~LOC~~ SUSP
65.0000 [IU] | Freq: Two times a day (BID) | SUBCUTANEOUS | Status: DC
  Administered 2015-03-22: 65 [IU] via SUBCUTANEOUS
  Filled 2015-03-21: qty 10

## 2015-03-21 NOTE — Progress Notes (Signed)
Patient Name: [name] Date of Encounter: 03/21/2015  Principal Problem:   Unstable angina Active Problems:   Proliferative diabetic retinopathy   HTN, on multiple medications prior to admission.   Coronary atherosclerosis-not a surgical or PCI candidate 2010   GERD   Obesity, morbid   DM (diabetes mellitus), type 2, uncontrolled, with renal complications   Presumed OSA (obstructive sleep apnea)   Chronic diastolic heart failure, NYHA class 3   Dyslipidemia   Chronic renal insufficiency, stage III (moderate)  SUBJECTIVE  No chest pain, sob or palpitation. Feels better.   CURRENT MEDS . acetaZOLAMIDE  500 mg Oral BID  . aspirin  81 mg Oral Daily  . atorvastatin  80 mg Oral q1800  . brimonidine  1 drop Both Eyes BID   And  . timolol  1 drop Both Eyes BID  . clopidogrel  75 mg Oral Daily  . diltiazem  240 mg Oral Daily  . ferrous sulfate  325 mg Oral Q breakfast  . hydrALAZINE  50 mg Oral BID  . insulin aspart  0-15 Units Subcutaneous TID WC  . insulin aspart protamine- aspart  65 Units Subcutaneous BID WC  . isosorbide mononitrate  60 mg Oral Daily  . latanoprost  1 drop Both Eyes QHS  . metoprolol  200 mg Oral Daily  . pantoprazole  40 mg Oral Daily  . potassium chloride  30 mEq Oral BID  . ranolazine  500 mg Oral BID    OBJECTIVE  Filed Vitals:   03/20/15 1439 03/20/15 2049 03/21/15 0500 03/21/15 0740  BP: 127/52 127/67  115/61  Pulse: 69 62  60  Temp: 98.4 F (36.9 C) 97.8 F (36.6 C)  98 F (36.7 C)  TempSrc: Oral Oral  Oral  Resp: 18 20  18   Height:      Weight:   274 lb 9.6 oz (124.558 kg)   SpO2: 96% 100%  98%    Intake/Output Summary (Last 24 hours) at 03/21/15 1018 Last data filed at 03/21/15 0749  Gross per 24 hour  Intake   1285 ml  Output   1650 ml  Net   -365 ml   Filed Weights   03/19/15 0627 03/20/15 0706 03/21/15 0500  Weight: 271 lb 9.6 oz (123.197 kg) 271 lb 4.8 oz (123.061 kg) 274 lb 9.6 oz (124.558 kg)    PHYSICAL  EXAM  General: Pleasant, obese woman in NAD. Neuro: Alert and oriented X 3. Moves all extremities spontaneously. Psych: Normal affect. HEENT:  Normal  Neck: Supple without bruits or JVD. Lungs:  Resp regular and unlabored, CTA. Heart: RRR no s3, s4, or murmurs. Abdomen: Soft, non-tender, non-distended, BS + x 4.  Extremities: No clubbing, cyanosis or edema. DP/PT/Radials 2+ and equal bilaterally.  Accessory Clinical Findings  CBC  Recent Labs  03/18/15 1514 03/19/15 0600  WBC 12.5* 11.1*  NEUTROABS 10.5*  --   HGB 12.7 12.0  HCT 38.5 36.0  MCV 92.1 91.4  PLT 253 0000000   Basic Metabolic Panel  Recent Labs  03/20/15 0336 03/21/15 0315  NA 139 139  K 3.7 3.8  CL 106 107  CO2 24 22  GLUCOSE 104* 79  BUN 15 19  CREATININE 1.76* 1.87*  CALCIUM 8.9 8.4*   Liver Function Tests  Recent Labs  03/18/15 1514  AST 15  ALT 12*  ALKPHOS 53  BILITOT 0.3  PROT 6.9  ALBUMIN 3.0*    Recent Labs  03/18/15 1514  LIPASE 21*  Cardiac Enzymes  Recent Labs  03/18/15 1924 03/18/15 2318 03/19/15 0600  TROPONINI <0.03 <0.03 <0.03    TELE  NSR with PVCs.   Radiology/Studies  Dg Chest 2 View  03/18/2015   CLINICAL DATA:  Chest pain starting last night  EXAM: CHEST  2 VIEW  COMPARISON:  04/03/2012  FINDINGS: Cardiomegaly is noted. No acute infiltrate or pleural effusion. No pulmonary edema. Mild degenerative changes thoracic spine.  IMPRESSION: No active cardiopulmonary disease.  Cardiomegaly.   Electronically Signed   By: Lahoma Crocker M.D.   On: 03/18/2015 10:08   Echo 03/19/15 LV EF: 60% -  65%  ------------------------------------------------------------------- Indications:   Chest pain 786.51.  ------------------------------------------------------------------- History:  PMH:  Coronary artery disease. Risk factors: Obstructive sleep apnea. Hypertension. Diabetes  mellitus. Dyslipidemia.  ------------------------------------------------------------------- Study Conclusions  - Left ventricle: The cavity size was normal. There was mild concentric hypertrophy. Systolic function was normal. The estimated ejection fraction was in the range of 60% to 65%. Wall motion was normal; there were no regional wall motion abnormalities. Features are consistent with a pseudonormal left ventricular filling pattern, with concomitant abnormal relaxation and increased filling pressure (grade 2 diastolic dysfunction). - Right ventricle: The cavity size was mildly dilated. Wall thickness was normal. - Pulmonary arteries: Systolic pressure was mildly increased. PA peak pressure: 36 mm Hg (S).  ASSESSMENT AND PLAN   59 y.o. AA female with history of CAD (last cath 2010 showed diffuse RCA, CFX, and DX1 disease-turned down for CABG or PCI- medically managed), diastolic HF, OSA, Type II DM, HLD, morbid obesity (BMI 48), and HTN. She was admitted 03/18/15 with increasing NTG responsive chest pain. She has ruled out for an MI. Treated with nitrates and IV Heparin.   1. Unstable angina - No further chest pain. Top x 3 negative. Continue Imdur, plavix, renexa  and statin. No cath at this point.  F/u with Dr. Sallyanne Kuster. Consider OP cath if sx recur.   2. Chronic diastolic heart failure, NYHA class 3 - Echo LV Ef of 60-65%, grade 2DD and  PA peak pressure: 36 mm Hg (S). - Appears euvolemic - Hled losartin, IV lasix and HCTZ yesterday due to worsen Cr. --> Cr worsen further to 1.87. MD to advice further. If she is going home today, advice discharge medications. She has seen by nephrologist in past, however dose not remember name. She is also on daily KDUR 79meq BID.    3. Acute on hronic renal insufficiency, stage III (moderate) - As above  4. DM - SSI  Signed, Bhagat,Bhavinkumar PA-C Pager [phone]   I have examined the patient and reviewed assessment  and plan and discussed with patient.  Agree with above as stated.  ARF. Cr. levelling off.  COntinue to hold ARB and diuretics.  Recheck in AM.  If Cr stable, could consider discharge with recheck labs next week.  No plans for cath unless there are recurrent sx and she has had normalization of her kidney function.   VARANASI,JAYADEEP S.

## 2015-03-22 ENCOUNTER — Other Ambulatory Visit: Payer: Self-pay | Admitting: Physician Assistant

## 2015-03-22 DIAGNOSIS — I5032 Chronic diastolic (congestive) heart failure: Secondary | ICD-10-CM | POA: Diagnosis not present

## 2015-03-22 DIAGNOSIS — I200 Unstable angina: Secondary | ICD-10-CM | POA: Diagnosis not present

## 2015-03-22 DIAGNOSIS — N189 Chronic kidney disease, unspecified: Secondary | ICD-10-CM

## 2015-03-22 DIAGNOSIS — N289 Disorder of kidney and ureter, unspecified: Principal | ICD-10-CM

## 2015-03-22 LAB — GLUCOSE, CAPILLARY
GLUCOSE-CAPILLARY: 78 mg/dL (ref 65–99)
Glucose-Capillary: 186 mg/dL — ABNORMAL HIGH (ref 65–99)
Glucose-Capillary: 256 mg/dL — ABNORMAL HIGH (ref 65–99)

## 2015-03-22 MED ORDER — ISOSORBIDE MONONITRATE ER 60 MG PO TB24: 60.0000 mg | ORAL_TABLET | Freq: Every day | ORAL | Status: DC

## 2015-03-22 MED ORDER — CLOPIDOGREL BISULFATE 75 MG PO TABS: 75.0000 mg | ORAL_TABLET | Freq: Every day | ORAL | Status: DC

## 2015-03-22 MED ORDER — ATORVASTATIN CALCIUM 80 MG PO TABS: 80.0000 mg | ORAL_TABLET | Freq: Every day | ORAL | Status: DC

## 2015-03-22 NOTE — Discharge Summary (Signed)
Discharge Summary   Patient ID: [name],  MRN: [id number], DOB/AGE: April 24, 1956 59 y.o.  Admit date: 03/18/2015 Discharge date: 03/22/2015  Primary Care Provider: Lorayne Marek Primary Cardiologist: Dr. Sallyanne Kuster  Discharge Diagnoses Principal Problem:   Unstable angina Active Problems:   Proliferative diabetic retinopathy   HTN, on multiple medications prior to admission.   Coronary atherosclerosis-not a surgical or PCI candidate 2010   GERD   Obesity, morbid   DM (diabetes mellitus), type 2, uncontrolled, with renal complications   Presumed OSA (obstructive sleep apnea)   Chronic diastolic heart failure, NYHA class 3   Dyslipidemia   Chronic renal insufficiency, stage III (moderate)   Allergies Allergies  Allergen Reactions  . Lorazepam Anaphylaxis  . Orange Fruit [Citrus] Anaphylaxis    Pt stated throat swelling, itching    Procedures  Echocardiogram 03/19/2015 LV EF: 60% -  65%  ------------------------------------------------------------------- Indications:   Chest pain 786.51.  ------------------------------------------------------------------- History:  PMH:  Coronary artery disease. Risk factors: Obstructive sleep apnea. Hypertension. Diabetes mellitus. Dyslipidemia.  ------------------------------------------------------------------- Study Conclusions  - Left ventricle: The cavity size was normal. There was mild concentric hypertrophy. Systolic function was normal. The estimated ejection fraction was in the range of 60% to 65%. Wall motion was normal; there were no regional wall motion abnormalities. Features are consistent with a pseudonormal left ventricular filling pattern, with concomitant abnormal relaxation and increased filling pressure (grade 2 diastolic dysfunction). - Right ventricle: The cavity size was mildly dilated. Wall thickness was normal. - Pulmonary arteries: Systolic pressure was mildly increased.  PA peak pressure: 36 mm Hg (S).   Hospital Course  The patient is a 59 year old African-American female with PMH of CAD, diastolic heart failure, OSA, type 2 DM, HLD, morbid obesity and HTN. She was admitted on 03/18/2015 with increasing nitroglycerin responsive chest pain. Her last cardiac cath was in 2010 which showed diffuse right coronary, left circumflex and first diagonal disease. She was turned down for CABG and is now medically managed. On arrival to [facility], she complained of chest discomfort over the past 2 days. Her husband reports she is not active and became short of breath when walking around the house as well as rest. She reports orthopnea and also endorsed nausea, dizziness, and headache and pain down her left arm. She also reports having a productive cough that has been present for several month. She was last seen by Dr. Sallyanne Kuster in Mar 2016 and was doing well at the time. Her last echocardiogram on 04/06/2012 showed EF 09-23%, grade 1 diastolic dysfunction.   On admission, new medications include Imdur, Plavix and Lipitor were started. She was seen by Dr. Irish Lack during this admission who reviewed her cath film from 2010, she has occluded RPDA, moderate disease in LAD, different options including repeat cath versus medical therapy has been discussed with the patient. Echocardiogram was obtained on 8/17 which showed EF 30-07%, grade 2 diastolic dysfunction, no regional wall motion abnormality, PA peak pressure 36. The question of repeat cath were discussed with the patient again on 8/18, however her creatinine increased and it was eventually decided not to consider cath at this time and discharge her when renal function stabilized to reconsider outpatient cath. Of note, her creatinine on arrival was 1.21 and plateaued on 8/19 with creatinine 1.87.   She was seen in the morning of 03/22/2015, at which time she denies any further discomfort. Troponin were negative 3. She has been  ruled out of ACS. It was decided not to  pursue cardiac catheterization at this time given elevated creatinine. Patient is considered stable for discharge from cardiology perspective home with close outpatient BMET to monitor her renal function next week. She will need to follow-up with Dr. Sallyanne Kuster for consideration of outpatient cath was creatinine issue resolved and stable medical regimen obtained. Given the fact that her Lasix has been on hold due to elevated creatinine, there is high likelihood of fluid reaccumulation, I have sent a staff email to our scheduler to contact the patient to arrange 7-10 day transition of care follow-up. I have also instructed her to obtain a basic metabolic panel in the clinic next Thur to monitor renal function. Once her renal function improve, it is possible that her Lasix can be restarted later if she has any sign of fluid overload. Of note, her losartan hydrochlorothiazide is also on hold at this time.    Discharge Vitals Blood pressure 150/60, pulse 67, temperature 98 F (36.7 C), temperature source Oral, resp. rate 20, height _0  (1.6 m), weight 274 lb 9.3 oz (124.55 kg), last menstrual period 04/02/2012, SpO2 100 %.  Filed Weights   03/20/15 0706 03/21/15 0500 03/22/15 0613  Weight: 271 lb 4.8 oz (123.061 kg) 274 lb 9.6 oz (124.558 kg) 274 lb 9.3 oz (124.55 kg)    Labs  Basic Metabolic Panel  Recent Labs  03/20/15 0336 03/21/15 0315  NA 139 139  K 3.7 3.8  CL 106 107  CO2 24 22  GLUCOSE 104* 79  BUN 15 19  CREATININE 1.76* 1.87*  CALCIUM 8.9 8.4*    Disposition  Pt is being discharged home today in good condition.  Follow-up Plans & Appointments      Follow-up Information    Follow up with Sanda Klein, MD.   Specialty:  Cardiology   Why:  Office will contact you to arrange close outpatient followup, please give [name] a call if you do not hear from [name] in 2 business days   Contact information:   [address] [place] [address] [phone]       Follow up with [facility] On 03/27/2015.   Why:  Please go to [facility] office during lab hour from 8 am to 5pm to obtain BMET to monitor your renal function   Contact information:   [place] [id number] [phone]      Discharge Medications    Medication List    STOP taking these medications        furosemide 40 MG tablet  Commonly known as:  LASIX     losartan-hydrochlorothiazide 100-25 MG per tablet  Commonly known as:  HYZAAR     potassium chloride 10 MEQ tablet  Commonly known as:  KLOR-CON M10     VYTORIN 10-20 MG per tablet  Generic drug:  ezetimibe-simvastatin      TAKE these medications        acetaminophen 500 MG tablet  Commonly known as:  TYLENOL  Take 500 mg by mouth every 6 (six) hours as needed. For pain     acetaZOLAMIDE 500 MG capsule  Commonly known as:  DIAMOX  Take 500 mg by mouth 2 (two) times daily.     aspirin 81 MG tablet  Take 81 mg by mouth daily.     atorvastatin 80 MG tablet  Commonly known as:  LIPITOR  Take 1 tablet (80 mg total) by mouth daily at 6 PM.     baclofen 10 MG tablet  Commonly known as:  LIORESAL  Take 1 tablet (10 mg total) by mouth at bedtime as needed for muscle spasms.     bismuth subsalicylate 503 TW/65KC suspension  Commonly known as:  PEPTO BISMOL  Take 30 mLs by mouth every 6 (six) hours as needed.     clopidogrel 75 MG tablet  Commonly known as:  PLAVIX  Take 1 tablet (75 mg total) by mouth daily.     COMBIGAN 0.2-0.5 % ophthalmic solution  Generic drug:  brimonidine-timolol  Place 1 drop into both eyes every 12 (twelve) hours.     diltiazem 240 MG 24 hr capsule  Commonly known as:  DILACOR XR  TAKE ONE CAPSULE BY MOUTH ONCE DAILY     ferrous sulfate 325 (65 FE) MG tablet  Take 325 mg by mouth daily with breakfast.     glucose blood test strip  Commonly known as:  FREESTYLE LITE  Use as instructed      glucose monitoring kit monitoring kit  1 each by Does not apply route 4 (four) times daily - after meals and at bedtime. 1 month Diabetic Testing Supplies for QAC-QHS accuchecks. Test up to 4 times daily E11.9     hydrALAZINE 50 MG tablet  Commonly known as:  APRESOLINE  Take 1 tablet (50 mg total) by mouth 2 (two) times daily.     hydrocortisone cream 1 %  Apply 1 application topically daily as needed for itching.     insulin lispro protamine-lispro (75-25) 100 UNIT/ML Susp injection  Commonly known as:  HUMALOG MIX 75/25  Inject 65 Units into the skin 2 (two) times daily with a meal.     Insulin Syringe-Needle U-100 30G X 5/16" 1 ML Misc  Commonly known as:  RELION INSULIN SYR 1CC/30G  USE AS DIRECTED     isosorbide mononitrate 60 MG 24 hr tablet  Commonly known as:  IMDUR  Take 1 tablet (60 mg total) by mouth daily.     LUMIGAN 0.01 % Soln  Generic drug:  bimatoprost  Place 1 drop into both eyes 2 (two) times daily.     metoprolol 200 MG 24 hr tablet  Commonly known as:  TOPROL-XL  TAKE ONE TABLET BY MOUTH ONCE DAILY     MUSCLE RUB EX  Apply 1 application topically daily as needed (pain).     NITROSTAT 0.4 MG SL tablet  Generic drug:  nitroGLYCERIN  DISSOLVE ONE TABLET UNDER THE TONGUE EVERY 5 MINUTES AS NEEDED FOR CHEST PAIN.  DO NOT EXCEED A TOTAL OF 3 DOSES IN 15 MINUTES     pantoprazole 40 MG tablet  Commonly known as:  PROTONIX  Take 1 tablet (40 mg total) by mouth daily.     polyethylene glycol packet  Commonly known as:  MIRALAX / GLYCOLAX  Take 17 g by mouth daily as needed for mild constipation.     polyvinyl alcohol 1.4 % ophthalmic solution  Commonly known as:  LIQUIFILM TEARS  Place 1 drop into both eyes daily as needed. For dry eyes     RANEXA 500 MG 12 hr tablet  Generic drug:  ranolazine  TAKE ONE TABLET BY MOUTH TWICE DAILY     RESTASIS 0.05 % ophthalmic emulsion  Generic drug:  cycloSPORINE  Place 1 drop into both eyes 2 (two) times daily.         Outstanding Labs/Studies  BMET next Thur  Duration of Discharge Encounter   Greater than 30 minutes including physician time.  Hilbert Corrigan PA-C Pager: [phone] 03/22/2015, 8:33 PM

## 2015-03-22 NOTE — Discharge Instructions (Signed)
Angina Pectoris  Angina pectoris is extreme discomfort in your chest, neck, or arm. Your doctor may call it just angina. It is caused by a lack of oxygen to your heart wall. It may feel like tightness or heavy pressure. It may feel like a crushing or squeezing pain. Some people say it feels like gas. It may go down your shoulders, back, and arms. Some people have symptoms other than pain. These include:  · Tiredness.  · Shortness of breath.  · Cold sweats.  · Feeling sick to your stomach (nausea).  There are four types of angina:  · Stable angina. This type often lasts the same amount of time each time it happens. Activity, stress, or excitement can bring it on. It often gets better after taking a medicine called nitroglycerin. This goes under your tongue.  · Unstable angina. This type can happen when you are not active or even during sleep. It can suddenly get worse or happen more often. It may not get better after taking the special medicine. It can last up to 30 minutes.  · Microvascular angina. This type is more common in women. It may be more severe or last longer than other types.  · Prinzmetal angina. This type often happens when you are not active or in the early morning hours.  HOME CARE   · Only take medicines as told by your doctor.  · Stay active or exercise more as told by your doctor.  · Limit very hard activity as told by your doctor.  · Limit heavy lifting as told by your doctor.  · Keep a healthy weight.  · Learn about and eat foods that are healthy for your heart.  · Do not use any tobacco such as cigarettes, chewing tobacco, or e-cigarettes.  GET HELP RIGHT AWAY IF:   · You have chest, neck, deep shoulder, or arm pain or discomfort that lasts more than a few minutes.  · You have chest, neck, deep shoulder, or arm pain or discomfort that goes away and comes back over and over again.  · You have heavy sweating that seems to happen for no reason.  · You have shortness of breath or trouble  breathing.  · Your angina does not get better after a few minutes of rest.  · Your angina does not get better after you take nitroglycerin medicine.  These can all be symptoms of a heart attack. Get help right away. Call your local emergency service (911 in [place] Do not  drive yourself to the hospital. Do not  wait to for your symptoms to go away.  MAKE SURE YOU:   · Understand these instructions.  · Will watch your condition.  · Will get help right away if you are not doing well or get worse.  Document Released: 01/05/2008 Document Revised: 07/24/2013 Document Reviewed: 11/20/2013  ExitCare® Patient Information ©2015 [employer]. This information is not intended to replace advice given to you by your health care provider. Make sure you discuss any questions you have with your health care provider.

## 2015-03-22 NOTE — Progress Notes (Signed)
Patient ID: EFRAT JENSON, female   DOB: Nov 10, 1955, 59 y.o.   MRN: [id number]   Patient Name: [name] Date of Encounter: 03/22/2015  Principal Problem:   Unstable angina Active Problems:   Proliferative diabetic retinopathy   HTN, on multiple medications prior to admission.   Coronary atherosclerosis-not a surgical or PCI candidate 2010   GERD   Obesity, morbid   DM (diabetes mellitus), type 2, uncontrolled, with renal complications   Presumed OSA (obstructive sleep apnea)   Chronic diastolic heart failure, NYHA class 3   Dyslipidemia   Chronic renal insufficiency, stage III (moderate)  SUBJECTIVE  No chest pain, sob or palpitation. Feels better.   CURRENT MEDS . acetaZOLAMIDE  500 mg Oral BID  . aspirin  81 mg Oral Daily  . atorvastatin  80 mg Oral q1800  . brimonidine  1 drop Both Eyes BID   And  . timolol  1 drop Both Eyes BID  . clopidogrel  75 mg Oral Daily  . diltiazem  240 mg Oral Daily  . ferrous sulfate  325 mg Oral Q breakfast  . hydrALAZINE  50 mg Oral BID  . insulin aspart  0-15 Units Subcutaneous TID WC  . insulin aspart protamine- aspart  65 Units Subcutaneous BID WC  . isosorbide mononitrate  60 mg Oral Daily  . latanoprost  1 drop Both Eyes QHS  . metoprolol  200 mg Oral Daily  . pantoprazole  40 mg Oral Daily  . potassium chloride  30 mEq Oral BID  . ranolazine  500 mg Oral BID    OBJECTIVE  Filed Vitals:   03/21/15 1052 03/21/15 1357 03/21/15 2100 03/22/15 0613  BP: 140/68 125/57 122/67 123/70  Pulse: 67 69 70 68  Temp:  97.9 F (36.6 C) 97.6 F (36.4 C) 97.3 F (36.3 C)  TempSrc:  Oral Oral Oral  Resp:  16 18 18   Height:      Weight:    124.55 kg (274 lb 9.3 oz)  SpO2:  100% 100% 100%    Intake/Output Summary (Last 24 hours) at 03/22/15 1155 Last data filed at 03/22/15 1028  Gross per 24 hour  Intake   1520 ml  Output   1001 ml  Net    519 ml   Filed Weights   03/20/15 0706 03/21/15 0500 03/22/15 0613  Weight: 123.061  kg (271 lb 4.8 oz) 124.558 kg (274 lb 9.6 oz) 124.55 kg (274 lb 9.3 oz)    PHYSICAL EXAM  General: Pleasant, obese woman in NAD. Neuro: Alert and oriented X 3. Moves all extremities spontaneously. Psych: Normal affect. HEENT:  Normal  Neck: Supple without bruits or JVD. Lungs:  Resp regular and unlabored, CTA. Heart: RRR no s3, s4, or murmurs. Abdomen: Soft, non-tender, non-distended, BS + x 4.  Extremities: No clubbing, cyanosis or edema. DP/PT/Radials 2+ and equal bilaterally.  Accessory Clinical Findings  CBC No results for input(s): WBC, NEUTROABS, HGB, HCT, MCV, PLT in the last 72 hours. Basic Metabolic Panel  Recent Labs  03/20/15 0336 03/21/15 0315  NA 139 139  K 3.7 3.8  CL 106 107  CO2 24 22  GLUCOSE 104* 79  BUN 15 19  CREATININE 1.76* 1.87*  CALCIUM 8.9 8.4*    TELE  NSR with PVCs.   Radiology/Studies  Dg Chest 2 View  03/18/2015   CLINICAL DATA:  Chest pain starting last night  EXAM: CHEST  2 VIEW  COMPARISON:  04/03/2012  FINDINGS: Cardiomegaly is noted.  No acute infiltrate or pleural effusion. No pulmonary edema. Mild degenerative changes thoracic spine.  IMPRESSION: No active cardiopulmonary disease.  Cardiomegaly.   Electronically Signed   By: Lahoma Crocker M.D.   On: 03/18/2015 10:08   Echo 03/19/15 LV EF: 60% -  65%  ------------------------------------------------------------------- Indications:   Chest pain 786.51.  ------------------------------------------------------------------- History:  PMH:  Coronary artery disease. Risk factors: Obstructive sleep apnea. Hypertension. Diabetes mellitus. Dyslipidemia.  ------------------------------------------------------------------- Study Conclusions  - Left ventricle: The cavity size was normal. There was mild concentric hypertrophy. Systolic function was normal. The estimated ejection fraction was in the range of 60% to 65%. Wall motion was normal; there were no regional wall  motion abnormalities. Features are consistent with a pseudonormal left ventricular filling pattern, with concomitant abnormal relaxation and increased filling pressure (grade 2 diastolic dysfunction). - Right ventricle: The cavity size was mildly dilated. Wall thickness was normal. - Pulmonary arteries: Systolic pressure was mildly increased. PA peak pressure: 36 mm Hg (S).  ASSESSMENT AND PLAN   59 y.o. AA female with history of CAD (last cath 2010 showed diffuse RCA, CFX, and DX1 disease-turned down for CABG or PCI- medically managed), diastolic HF, OSA, Type II DM, HLD, morbid obesity (BMI 48), and HTN. She was admitted 03/18/15 with increasing NTG responsive chest pain. She has ruled out for an MI. Treated with nitrates and IV Heparin.   1. Unstable angina - No further chest pain. Top x 3 negative. Continue Imdur, plavix, renexa  and statin. No cath at this point.  F/u with Dr. Sallyanne Kuster. Consider OP cath if sx recur.   2. Chronic diastolic heart failure, NYHA class 3 - Echo LV Ef of 60-65%, grade 2DD and  PA peak pressure: 36 mm Hg (S). - Appears euvolemic   3. Acute on hronic renal insufficiency, stage III (moderate) - As above  CR staying around 1.7-1.8  Hold diuretics  F/u BMET has outpatient next Thursday.    4. DM - SSI   DC home BMET next week.  F/U [facility] for consideration of cath once CR issues resolved and stable medical regimen obtained   Signed, Jenkins Rouge PA-C Pager [phone]   I have examined the patient and reviewed assessment and plan and discussed with patient.  Agree with above as stated.  ARF. Cr. levelling off.  COntinue to hold ARB and diuretics.  Recheck in AM.  If Cr stable, could consider discharge with recheck labs next week.  No plans for cath unless there are recurrent sx and she has had normalization of her kidney function.   Jenkins Rouge

## 2015-03-24 ENCOUNTER — Telehealth: Payer: Self-pay | Admitting: Cardiovascular Disease

## 2015-03-24 NOTE — Telephone Encounter (Signed)
7-10 day TCM fu -appt 03-31-15 at [place]

## 2015-03-24 NOTE — Telephone Encounter (Signed)
Patient contacted regarding discharge from [facility] on 03/22/15.  Patient understands to follow up with Kerin Ransom, [place] on Monday 03/31/15 at 9:00 am at [facility]. Patient understands discharge instructions? Yes Patient understands medications and regiment? Yes Patient understands to bring all medications to this visit? Yes  The patient and her husband were unclear as to the reasoning why the patient's medications were changed around at discharge. I answered their questions regarding these changes. The patient's medication list was reviewed in detail with the patient. She states she has some prescriptions at the pharmacy to pick up today. She was not certain what they were, but she could not confirm that she had her lipitor/ plavix/ protonix prescription.

## 2015-03-31 ENCOUNTER — Ambulatory Visit (INDEPENDENT_AMBULATORY_CARE_PROVIDER_SITE_OTHER): Payer: Medicare Other | Admitting: Cardiology

## 2015-03-31 ENCOUNTER — Encounter: Payer: Self-pay | Admitting: Cardiology

## 2015-03-31 VITALS — BP 169/82 | HR 72 | Ht 63.0 in | Wt 283.0 lb

## 2015-03-31 DIAGNOSIS — N183 Chronic kidney disease, stage 3 unspecified: Secondary | ICD-10-CM

## 2015-03-31 DIAGNOSIS — I5033 Acute on chronic diastolic (congestive) heart failure: Secondary | ICD-10-CM | POA: Insufficient documentation

## 2015-03-31 DIAGNOSIS — IMO0002 Reserved for concepts with insufficient information to code with codable children: Secondary | ICD-10-CM

## 2015-03-31 DIAGNOSIS — I200 Unstable angina: Secondary | ICD-10-CM | POA: Diagnosis not present

## 2015-03-31 DIAGNOSIS — I5032 Chronic diastolic (congestive) heart failure: Secondary | ICD-10-CM | POA: Diagnosis not present

## 2015-03-31 DIAGNOSIS — E1129 Type 2 diabetes mellitus with other diabetic kidney complication: Secondary | ICD-10-CM | POA: Diagnosis not present

## 2015-03-31 DIAGNOSIS — N189 Chronic kidney disease, unspecified: Secondary | ICD-10-CM

## 2015-03-31 DIAGNOSIS — E1165 Type 2 diabetes mellitus with hyperglycemia: Secondary | ICD-10-CM

## 2015-03-31 MED ORDER — FUROSEMIDE 80 MG PO TABS: 80.0000 mg | ORAL_TABLET | Freq: Every day | ORAL | Status: DC

## 2015-03-31 NOTE — Assessment & Plan Note (Addendum)
Recent admission with [place].

## 2015-03-31 NOTE — Assessment & Plan Note (Signed)
Last SCr 1.8

## 2015-03-31 NOTE — Assessment & Plan Note (Signed)
Followed at [facility]

## 2015-03-31 NOTE — Assessment & Plan Note (Signed)
Increasing wgt and edema

## 2015-03-31 NOTE — Patient Instructions (Signed)
Medication Instructions:  Start Lasix ( 80 mg ) daily  Labwork: Your physician recommends that you return for lab work On September 9 between 8-5   Testing/Procedures: -none  Follow-Up: Your physician recommends that you keep your scheduled  follow-up appointment with Truitt Merle, Np on September 12 @ 9:00 am  Any Other Special Instructions Will Be Listed Below (If Applicable).

## 2015-03-31 NOTE — Progress Notes (Signed)
03/31/2015 [name]   June 07, 1956  [id number]  Primary Physician Lorayne Marek, MD Primary Cardiologist: Dr Sallyanne Kuster  HPI:  59 year old African-American female with PMH of CAD, diastolic heart failure, OSA, type 2 DM, HLD, and HTN. She was admitted on 03/18/2015 with chest pain. Her last cardiac cath was in 2010 which showed diffuse right coronary, left circumflex and first diagonal disease. She was turned down for CABG and is now medically managed.  Dr. Irish Lack reviewed her cath film from 2010, she has occluded RPDA, moderate disease in LAD, different options including repeat cath versus medical therapy has been discussed with the patient. Echocardiogram was obtained on 8/17 which showed EF 69-48%, grade 2 diastolic dysfunction, no regional wall motion abnormality, PA peak pressure 36. The question of repeat cath were discussed with the patient again on 8/18, however her creatinine increased and it was eventually decided not to consider cath at this time and discharge her when renal function stabilized to reconsider outpatient cath. Of note, her creatinine on arrival was 1.21 and plateaued on 8/19 with creatinine 1.87.  It was decided not to pursue cardiac catheterization at this time given elevated creatinine. The pt's ARB and diuretics were held an she is seen now in follow up. Fortunately she has not had recurrent chest pain. She does complain of increasing edema. and wgt since discharge.    Current Outpatient Prescriptions  Medication Sig Dispense Refill  . acetaminophen (TYLENOL) 500 MG tablet Take 500 mg by mouth every 6 (six) hours as needed. For pain    . acetaZOLAMIDE (DIAMOX) 500 MG capsule Take 500 mg by mouth 2 (two) times daily.    Marland Kitchen aspirin 81 MG tablet Take 81 mg by mouth daily.    Marland Kitchen atorvastatin (LIPITOR) 80 MG tablet Take 1 tablet (80 mg total) by mouth daily at 6 PM. 90 tablet 3  . baclofen (LIORESAL) 10 MG tablet Take 1 tablet (10 mg total) by mouth at bedtime as  needed for muscle spasms. 30 each 2  . bismuth subsalicylate (PEPTO BISMOL) 262 MG/15ML suspension Take 30 mLs by mouth every 6 (six) hours as needed.    . brimonidine-timolol (COMBIGAN) 0.2-0.5 % ophthalmic solution Place 1 drop into both eyes every 12 (twelve) hours.    . clopidogrel (PLAVIX) 75 MG tablet Take 1 tablet (75 mg total) by mouth daily. 90 tablet 3  . diltiazem (DILACOR XR) 240 MG 24 hr capsule TAKE ONE CAPSULE BY MOUTH ONCE DAILY 90 capsule 2  . ferrous sulfate 325 (65 FE) MG tablet Take 325 mg by mouth daily with breakfast.    . glucose blood (FREESTYLE LITE) test strip Use as instructed 100 each 12  . glucose monitoring kit (FREESTYLE) monitoring kit 1 each by Does not apply route 4 (four) times daily - after meals and at bedtime. 1 month Diabetic Testing Supplies for QAC-QHS accuchecks. Test up to 4 times daily E11.9 1 each 1  . hydrALAZINE (APRESOLINE) 50 MG tablet Take 1 tablet (50 mg total) by mouth 2 (two) times daily. 60 tablet 6  . hydrocortisone cream 1 % Apply 1 application topically daily as needed for itching.    . insulin lispro protamine-lispro (HUMALOG MIX 75/25) (75-25) 100 UNIT/ML SUSP injection Inject 65 Units into the skin 2 (two) times daily with a meal. 40 mL 3  . Insulin Syringe-Needle U-100 (RELION INSULIN SYR 1CC/30G) 30G X 5/16" 1 ML MISC USE AS DIRECTED 100 each 0  . isosorbide mononitrate (IMDUR) 60 MG  24 hr tablet Take 1 tablet (60 mg total) by mouth daily. 90 tablet 3  . LUMIGAN 0.01 % SOLN Place 1 drop into both eyes 2 (two) times daily.    . Menthol-Methyl Salicylate (MUSCLE RUB EX) Apply 1 application topically daily as needed (pain).    . metoprolol (TOPROL-XL) 200 MG 24 hr tablet TAKE ONE TABLET BY MOUTH ONCE DAILY 90 tablet 3  . NITROSTAT 0.4 MG SL tablet DISSOLVE ONE TABLET UNDER THE TONGUE EVERY 5 MINUTES AS NEEDED FOR CHEST PAIN.  DO NOT EXCEED A TOTAL OF 3 DOSES IN 15 MINUTES 25 tablet 5  . pantoprazole (PROTONIX) 40 MG tablet Take 1 tablet  (40 mg total) by mouth daily. 30 tablet 7  . polyethylene glycol (MIRALAX / GLYCOLAX) packet Take 17 g by mouth daily as needed for mild constipation.    . polyvinyl alcohol (LIQUIFILM TEARS) 1.4 % ophthalmic solution Place 1 drop into both eyes daily as needed. For dry eyes    . RANEXA 500 MG 12 hr tablet TAKE ONE TABLET BY MOUTH TWICE DAILY 60 tablet 11  . furosemide (LASIX) 80 MG tablet Take 1 tablet (80 mg total) by mouth daily. 30 tablet 9   No current facility-administered medications for this visit.    Allergies  Allergen Reactions  . Lorazepam Anaphylaxis  . Orange Fruit [Citrus] Anaphylaxis    Pt stated throat swelling, itching    Social History   Social History  . Marital Status: Married    Spouse Name: N/A  . Number of Children: 0  . Years of Education: N/A   Occupational History  . Not on file.   Social History Main Topics  . Smoking status: Never Smoker   . Smokeless tobacco: Never Used  . Alcohol Use: No  . Drug Use: No  . Sexual Activity: Yes   Other Topics Concern  . Not on file   Social History Narrative     Review of Systems: General: negative for chills, fever, night sweats or weight changes.  Cardiovascular: negative for chest pain, dyspnea on exertion, orthopnea, palpitations, paroxysmal nocturnal dyspnea or shortness of breath Dermatological: negative for rash Respiratory: negative for cough or wheezing Urologic: negative for hematuria Abdominal: negative for nausea, vomiting, diarrhea, bright red blood per rectum, melena, or hematemesis Neurologic: negative for visual changes, syncope, or dizziness All other systems reviewed and are otherwise negative except as noted above.    Blood pressure 169/82, pulse 72, height _0  (1.6 m), weight 283 lb (128.368 kg), last menstrual period 04/02/2012.  General appearance: alert, cooperative, no distress and morbidly obese Lungs: clear to auscultation bilaterally Heart: regular rate and  rhythm Extremities: 2+ edema Skin: cool and dry Neurologic: Grossly normal   ASSESSMENT AND PLAN:   Unstable angina No further chest pain.  Coronary atherosclerosis-not a surgical or PCI candidate 2010 Recent admission with [place].   DM (diabetes mellitus), type 2, uncontrolled, with renal complications Last SCr 1.8  Chronic renal insufficiency, stage III (moderate) Followed at [facility]  Acute on chronic diastolic congestive heart failure Increasing wgt and edema   PLAN  I resumed her Lasix at 80 mg daily. I was unable to get her in to see her nephrologist in the next few weeks so she will need to be seen here and a f/u BMP done.   Erlene Quan PA-C 03/31/2015 4:57 PM

## 2015-03-31 NOTE — Assessment & Plan Note (Addendum)
No further chest pain

## 2015-04-09 ENCOUNTER — Encounter: Payer: Self-pay | Admitting: *Deleted

## 2015-04-11 ENCOUNTER — Other Ambulatory Visit (INDEPENDENT_AMBULATORY_CARE_PROVIDER_SITE_OTHER): Payer: Medicare Other | Admitting: *Deleted

## 2015-04-11 DIAGNOSIS — N183 Chronic kidney disease, stage 3 unspecified: Secondary | ICD-10-CM

## 2015-04-11 DIAGNOSIS — N189 Chronic kidney disease, unspecified: Secondary | ICD-10-CM | POA: Diagnosis not present

## 2015-04-11 DIAGNOSIS — I5032 Chronic diastolic (congestive) heart failure: Secondary | ICD-10-CM

## 2015-04-11 LAB — BASIC METABOLIC PANEL
BUN: 13 mg/dL (ref 6–23)
CO2: 26 mEq/L (ref 19–32)
Calcium: 9 mg/dL (ref 8.4–10.5)
Chloride: 105 mEq/L (ref 96–112)
Creatinine, Ser: 1.19 mg/dL (ref 0.40–1.20)
GFR: 59.43 mL/min — ABNORMAL LOW (ref >60.00)
Glucose, Bld: 140 mg/dL — ABNORMAL HIGH (ref 70–99)
Potassium: 3.6 mEq/L (ref 3.5–5.1)
Sodium: 138 mEq/L (ref 135–145)

## 2015-04-11 NOTE — Addendum Note (Signed)
Addended by: Eulis Foster on: 04/11/2015 08:40 AM   Modules accepted: Orders

## 2015-04-14 ENCOUNTER — Ambulatory Visit: Payer: Medicare Other | Admitting: Nurse Practitioner

## 2015-04-14 NOTE — Progress Notes (Signed)
I would leave meds as they are, but can increase diuretics prn.

## 2015-05-19 ENCOUNTER — Other Ambulatory Visit: Payer: Self-pay | Admitting: Internal Medicine

## 2015-05-19 DIAGNOSIS — E139 Other specified diabetes mellitus without complications: Secondary | ICD-10-CM

## 2015-05-21 NOTE — Telephone Encounter (Signed)
Patients husband called to request a med refill for the patients syringes. Please f/u with pt.

## 2015-05-29 ENCOUNTER — Encounter: Payer: Self-pay | Admitting: Cardiovascular Disease

## 2015-05-29 ENCOUNTER — Ambulatory Visit (INDEPENDENT_AMBULATORY_CARE_PROVIDER_SITE_OTHER): Payer: Medicare Other | Admitting: Cardiovascular Disease

## 2015-05-29 VITALS — BP 138/76 | HR 92 | Resp 16 | Ht 63.0 in | Wt 280.0 lb

## 2015-05-29 DIAGNOSIS — N183 Chronic kidney disease, stage 3 (moderate): Secondary | ICD-10-CM

## 2015-05-29 DIAGNOSIS — IMO0002 Reserved for concepts with insufficient information to code with codable children: Secondary | ICD-10-CM

## 2015-05-29 DIAGNOSIS — E1122 Type 2 diabetes mellitus with diabetic chronic kidney disease: Secondary | ICD-10-CM | POA: Diagnosis not present

## 2015-05-29 DIAGNOSIS — H547 Unspecified visual loss: Secondary | ICD-10-CM

## 2015-05-29 DIAGNOSIS — E669 Obesity, unspecified: Secondary | ICD-10-CM

## 2015-05-29 DIAGNOSIS — I5032 Chronic diastolic (congestive) heart failure: Secondary | ICD-10-CM

## 2015-05-29 DIAGNOSIS — H540 Blindness, both eyes: Secondary | ICD-10-CM

## 2015-05-29 DIAGNOSIS — E1165 Type 2 diabetes mellitus with hyperglycemia: Secondary | ICD-10-CM

## 2015-05-29 DIAGNOSIS — Z794 Long term (current) use of insulin: Secondary | ICD-10-CM

## 2015-05-29 DIAGNOSIS — I25118 Atherosclerotic heart disease of native coronary artery with other forms of angina pectoris: Secondary | ICD-10-CM

## 2015-05-29 NOTE — Patient Instructions (Signed)
START TAKING YOUR ATORVASTATIN DAILY.  AN ORDER HAS BEEN PLACED FOR A SHOWER CHAIR.  Dr. Sallyanne Kuster recommends that you schedule a follow-up appointment in: 3 MONTHS

## 2015-05-29 NOTE — Progress Notes (Signed)
Patient ID: [name], female   DOB: [birth year], 59 y.o.   MRN: [id number]     Cardiology Office Note   Date:  05/29/2015   ID:  [name], [name] 1955-12-10, MRN [id number]  PCP:  Lorayne Marek, MD  Cardiologist:   Sanda Klein, MD   Chief Complaint  Patient presents with  . 2 months  . Dizziness  . Shortness of Breath      History of Present Illness: [name] is a 59 y.o. female who presents for  Follow-up for coronary artery disease with multivessel involvement, felt to be a poor candidate for revascularization with either percutaneous or surgical means.  She was hospitalized over the summer with angina pectoris and again the decision was made to proceed with medical therapy. She has not had any chest pain last couple of months and has not required sublingual nitroglycerin. She stopped taking atorvastatin since she felt it made her "sick" but her symptoms sound more like transient severe hypotension , possibly related to hydralazine. She has since also stop taking the hydralazine. Despite this her blood pressure control is good and she does not have any manifestations of congestive heart failure.   Her gait is poor due to neuropathy and arthritis. She has a lot of difficulty getting in and out of the bathtub and I'm concerned that she may fall or have serious accidents. She does not have a glucometer anymore and is not checking her blood sugar. Despite this she continues to take insulin. Her husband draws up the insulin because of her poor eyesight.   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 She also has systemic hypertension and left ventricular hypertrophy with chronic diastolic heart failure in the setting of morbid obesity and obstructive sleep apnea. Her most recent left ventricular ejection fraction was  60-65 percent by  the echocardiogram performed in August 2016. Estimated systolic PA pressure was 36 mmHg.  Past Medical History  Diagnosis Date  . Hypertension   . Hyperlipemia   . Coronary artery disease     Medical Rx  . GERD (gastroesophageal reflux disease)   . Morbid obesity with BMI of 40.0-44.9, adult ([place])   . Anemia   . Diabetic retinopathy ([place])   . Chronic diastolic heart failure ([place]) 9/13    echo 03/20/15 LV Ef of 60-65%, grade 2DD and PA peak pressure: 36 mm Hg   . Blind right eye   . Type II diabetes mellitus ([place])   . Myocardial infarction ([place]) "several"  . Obstructive sleep apnea     "they say I do; I say I don't; no mask" (03/18/2015)  . History of blood transfusion "several"    "related to menopause"  . Chronic back pain     "my whole back" (03/18/2015)    Past Surgical History  Procedure Laterality Date  . Incision and drainage perirectal abscess  04/03/2012  . Partial hysterectomy  1981    abdominal  . Dilation and curettage of uterus    . Cardiac catheterization  July 2010  . Cataract extraction, bilateral Bilateral   . Eye surgery Bilateral "several"     Current Outpatient Prescriptions  Medication Sig Dispense Refill  . acetaminophen (TYLENOL) 500 MG tablet Take 500 mg by mouth every 6 (six) hours as needed. For pain    . acetaZOLAMIDE (DIAMOX) 500 MG capsule Take 500 mg by mouth 2 (two) times  daily.    . aspirin 81 MG tablet Take 81 mg by mouth daily.    Marland Kitchen atorvastatin (LIPITOR) 80 MG tablet Take 1 tablet (80 mg total) by mouth daily at 6 PM. 90 tablet 3  . baclofen (LIORESAL) 10 MG tablet Take 1 tablet (10 mg total) by mouth at bedtime as needed for muscle spasms. 30 each 2  . bismuth subsalicylate (PEPTO BISMOL) 262 MG/15ML suspension Take 30 mLs by mouth every 6 (six) hours as needed.    . brimonidine-timolol (COMBIGAN) 0.2-0.5 % ophthalmic solution Place 1 drop into both eyes every 12 (twelve) hours.    . clopidogrel (PLAVIX) 75 MG tablet Take 1 tablet (75 mg  total) by mouth daily. 90 tablet 3  . diltiazem (DILACOR XR) 240 MG 24 hr capsule TAKE ONE CAPSULE BY MOUTH ONCE DAILY 90 capsule 2  . ferrous sulfate 325 (65 FE) MG tablet Take 325 mg by mouth daily with breakfast.    . furosemide (LASIX) 80 MG tablet Take 1 tablet (80 mg total) by mouth daily. 30 tablet 9  . glucose blood (FREESTYLE LITE) test strip Use as instructed 100 each 12  . glucose monitoring kit (FREESTYLE) monitoring kit 1 each by Does not apply route 4 (four) times daily - after meals and at bedtime. 1 month Diabetic Testing Supplies for QAC-QHS accuchecks. Test up to 4 times daily E11.9 1 each 1  . hydrocortisone cream 1 % Apply 1 application topically daily as needed for itching.    . insulin lispro protamine-lispro (HUMALOG MIX 75/25) (75-25) 100 UNIT/ML SUSP injection Inject 65 Units into the skin 2 (two) times daily with a meal. 40 mL 3  . Insulin Syringe-Needle U-100 (RELION INSULIN SYR 1CC/30G) 30G X 5/16" 1 ML MISC USE AS DIRECTED 100 each 0  . isosorbide mononitrate (IMDUR) 60 MG 24 hr tablet Take 1 tablet (60 mg total) by mouth daily. 90 tablet 3  . LUMIGAN 0.01 % SOLN Place 1 drop into both eyes 2 (two) times daily.    . Menthol-Methyl Salicylate (MUSCLE RUB EX) Apply 1 application topically daily as needed (pain).    . metoprolol (TOPROL-XL) 200 MG 24 hr tablet TAKE ONE TABLET BY MOUTH ONCE DAILY 90 tablet 3  . NITROSTAT 0.4 MG SL tablet DISSOLVE ONE TABLET UNDER THE TONGUE EVERY 5 MINUTES AS NEEDED FOR CHEST PAIN.  DO NOT EXCEED A TOTAL OF 3 DOSES IN 15 MINUTES 25 tablet 5  . pantoprazole (PROTONIX) 40 MG tablet Take 1 tablet (40 mg total) by mouth daily. 30 tablet 7  . polyethylene glycol (MIRALAX / GLYCOLAX) packet Take 17 g by mouth daily as needed for mild constipation.    . polyvinyl alcohol (LIQUIFILM TEARS) 1.4 % ophthalmic solution Place 1 drop into both eyes daily as needed. For dry eyes    . RANEXA 500 MG 12 hr tablet TAKE ONE TABLET BY MOUTH TWICE DAILY 60  tablet 11  . RELION INSULIN SYR 1CC/30G 30G X 5/16" 1 ML MISC USE AS DIRECTED 100 each 2   No current facility-administered medications for this visit.    Allergies:   Lorazepam and Orange fruit    Social History:  The patient  reports that she has never smoked. She has never used smokeless tobacco. She reports that she does not drink alcohol or use illicit drugs.   Family History:  The patient's family history includes Colon polyps in her father; Diabetes in her brother, father, maternal grandmother, mother, and sister; Heart attack  in her father and mother; Heart disease in her brother, father, mother, and sister; Hypertension in her brother, father, maternal grandmother, mother, and sister; Stroke in her father.    ROS:  Please see the history of present illness.    Otherwise, review of systems positive for none.   All other systems are reviewed and negative.    PHYSICAL EXAM: VS:  BP 138/76 mmHg  Pulse 92  Resp 16  Ht _0  (1.6 m)  Wt 280 lb (127.007 kg)  BMI 49.61 kg/m2  LMP 04/02/2012 , BMI Body mass index is 49.61 kg/(m^2).  General: Alert, oriented x3, no distress Head: no evidence of trauma, PERRL, EOMI, no exophtalmos or lid lag, no myxedema, no xanthelasma; normal ears, nose and oropharynx Neck: normal jugular venous pulsations and no hepatojugular reflux; brisk carotid pulses without delay and no carotid bruits Chest: clear to auscultation, no signs of consolidation by percussion or palpation, normal fremitus, symmetrical and full respiratory excursions Cardiovascular: normal position and quality of the apical impulse, regular rhythm, normal first and second heart sounds, no murmurs, rubs or gallops Abdomen: no tenderness or distention, no masses by palpation, no abnormal pulsatility or arterial bruits, normal bowel sounds, no hepatosplenomegaly Extremities: no clubbing, cyanosis or edema; 2+ radial, ulnar and brachial pulses bilaterally; 2+ right femoral, posterior  tibial and dorsalis pedis pulses; 2+ left femoral, posterior tibial and dorsalis pedis pulses; no subclavian or femoral bruits Neurological: grossly nonfocal Psych: euthymic mood, full affect   EKG:  EKG is not ordered today. The ekg ordered today demonstrates    Recent Labs: 03/18/2015: ALT 12*; B Natriuretic Peptide 21.8 03/19/2015: Hemoglobin 12.0; Platelets 246 04/11/2015: BUN 13; Creatinine, Ser 1.19; Potassium 3.6; Sodium 138    Lipid Panel    Component Value Date/Time   CHOL 100 11/14/2014 0920   TRIG 73 11/14/2014 0920   HDL 41* 11/14/2014 0920   CHOLHDL 2.4 11/14/2014 0920   VLDL 15 11/14/2014 0920   LDLCALC 44 11/14/2014 0920      Wt Readings from Last 3 Encounters:  05/29/15 280 lb (127.007 kg)  03/31/15 283 lb (128.368 kg)  03/22/15 274 lb 9.3 oz (124.55 kg)     ASSESSMENT AND PLAN:  1. Chronic diastolic heart failure, NYHA class 3  She is doing well on current diuretic dose. It is hard to say whether her functional impairments are related to obesity , complications of diabetes, arthritis or heart failure  2. CKD stage 3  creatinine was higher during the hospitalization August and has subsequently improved , back to baseline  3. CAD, cath 2010, turned down for CABG "poor targets"  Because of the diffuse disease she is not a good candidate for either percutaneous or surgical revascularization. Currently she has excellent control of her angina pectoris. Should not increase the dose of Ranexa any further because of her renal dysfunction. Continue nitrates, calcium channel blocker , beta blocker (already in high-dose).   4. Dyslipidemia  Excellent total and LDL cholesterol levels on Vytorin, switched now to atorvastatin high-dose. Her HDL is low but is unlikely to improve barring major reduction in her weight. Continue taking the atorvastatin. I don't think this was the cause for her symptoms of dizziness and near syncope.  5. HTN  acceptable blood pressure  control.  Ideally systolic blood pressure less than 130. I don't think she tolerated hydralazine well  6.Diabetes mellitus type 2, controlled  Last A1c was 7.9% , deteriorated compared to last December. She has advanced retinopathy,  nephropathy and neuropathy and aggressive glycemic control is important. She is not checking her blood glucose levels. I asked her to call her physician to request a new glucometer and instructions for follow-up.   I'm concerned about her risk for falling with serious injury when using the bathtub. I gave her prescription for a shower chair.  Current medicines are reviewed at length with the patient today.  The patient does not have concerns regarding medicines.  The following changes have been made:  no change.  Restart atorvastatin 80 mg daily  Labs/ tests ordered today include:  Orders Placed This Encounter  Procedures  . Shower chair      Patient Instructions  START TAKING YOUR ATORVASTATIN DAILY.  AN ORDER HAS BEEN PLACED FOR A SHOWER CHAIR.  Dr. Sallyanne Kuster recommends that you schedule a follow-up appointment in: [place], Cayli Escajeda, MD  05/29/2015 9:16 PM    Sanda Klein, MD, [facility] [phone] office [phone] pager

## 2015-07-18 ENCOUNTER — Telehealth: Payer: Self-pay | Admitting: Internal Medicine

## 2015-07-18 DIAGNOSIS — E139 Other specified diabetes mellitus without complications: Secondary | ICD-10-CM

## 2015-07-18 MED ORDER — INSULIN LISPRO PROT & LISPRO (75-25 MIX) 100 UNIT/ML ~~LOC~~ SUSP: 65.0000 [IU] | Freq: Two times a day (BID) | SUBCUTANEOUS | Status: DC

## 2015-07-18 NOTE — Telephone Encounter (Signed)
Patient called and requested med refills for her medications, patient needs enough medication to last until her next appointment. Please f/u

## 2015-07-18 NOTE — Telephone Encounter (Signed)
Nurse called patient, patient verified date of birth. Patient reports needing refills on medications. Patient requested nurse to speak to husband, husband is patients caregiver due to patient being legally blind. Humolog 75/25 needs to be refilled. Patient has tried to make appointment to be seen, front office staff told patient to call back in the beginning of January due to [facility] being booked. Nurse sent Humalog 75/25 to pharmacy with 1 refill.  Patient and husband voices understanding and has no further questions at this time.

## 2015-08-25 ENCOUNTER — Ambulatory Visit (INDEPENDENT_AMBULATORY_CARE_PROVIDER_SITE_OTHER): Payer: Medicare Other | Admitting: Cardiovascular Disease

## 2015-08-25 ENCOUNTER — Encounter: Payer: Self-pay | Admitting: Cardiovascular Disease

## 2015-08-25 VITALS — BP 130/80 | HR 69 | Ht 63.0 in | Wt 277.1 lb

## 2015-08-25 DIAGNOSIS — E785 Hyperlipidemia, unspecified: Secondary | ICD-10-CM

## 2015-08-25 DIAGNOSIS — Z794 Long term (current) use of insulin: Secondary | ICD-10-CM

## 2015-08-25 DIAGNOSIS — I25118 Atherosclerotic heart disease of native coronary artery with other forms of angina pectoris: Secondary | ICD-10-CM | POA: Diagnosis not present

## 2015-08-25 DIAGNOSIS — I5032 Chronic diastolic (congestive) heart failure: Secondary | ICD-10-CM | POA: Diagnosis not present

## 2015-08-25 DIAGNOSIS — E1122 Type 2 diabetes mellitus with diabetic chronic kidney disease: Secondary | ICD-10-CM

## 2015-08-25 DIAGNOSIS — N183 Chronic kidney disease, stage 3 unspecified: Secondary | ICD-10-CM

## 2015-08-25 DIAGNOSIS — N189 Chronic kidney disease, unspecified: Secondary | ICD-10-CM | POA: Diagnosis not present

## 2015-08-25 DIAGNOSIS — Z79899 Other long term (current) drug therapy: Secondary | ICD-10-CM

## 2015-08-25 DIAGNOSIS — IMO0002 Reserved for concepts with insufficient information to code with codable children: Secondary | ICD-10-CM

## 2015-08-25 DIAGNOSIS — E1165 Type 2 diabetes mellitus with hyperglycemia: Secondary | ICD-10-CM

## 2015-08-25 MED ORDER — RANOLAZINE ER 500 MG PO TB12: 500.0000 mg | ORAL_TABLET | Freq: Two times a day (BID) | ORAL | Status: DC

## 2015-08-25 MED ORDER — NITROGLYCERIN 0.4 MG SL SUBL: SUBLINGUAL_TABLET | SUBLINGUAL | Status: DC

## 2015-08-25 MED ORDER — METOPROLOL SUCCINATE ER 200 MG PO TB24: 200.0000 mg | ORAL_TABLET | Freq: Every day | ORAL | Status: DC

## 2015-08-25 MED ORDER — FUROSEMIDE 80 MG PO TABS
80.0000 mg | ORAL_TABLET | Freq: Every day | ORAL | Status: DC
Start: 2015-08-25 — End: 2016-08-24

## 2015-08-25 NOTE — Progress Notes (Signed)
Patient ID: [name], female   DOB: [birth year], 60 y.o.   MRN: [id number]    Cardiology Office Note    Date:  08/25/2015   ID:  Kenniya, [place] 31-Aug-1955, MRN [id number]  PCP:  Lorayne Marek, MD  Cardiologist:   Sanda Klein, MD   Chief Complaint  Patient presents with  . 3 month visit    pt c/o chest discomfort accompanied by back pain--last Wednesday and Saturday, took nitro  . Shortness of Breath    on exertion and during chest discomfort  . Headaches    when she lays flat  . Edema    left ankle--itchy and swollen  . Nausea    pt states everything makes her sick including water    History of Present Illness:  [name] is a 60 y.o. female with long-standing severe diabetes mellitus with multiple microvascular and macrovascular complications. She has extensive multivessel coronary artery disease with diffuse involvement, making her a poor candidate for either surgical or percutaneous revascularization. She has done quite well with medical therapy over the last year with very infrequent episodes of angina at rest or with activity. She is currently receiving both long-acting nitrates and ranolazine in addition to beta blockers and calcium channel blockers in high doses. She is able to perform usual activities without dyspnea or angina. She denies palpitations or syncope. She is very dizzy when she tries to walk upstairs. She has extremely poor vision which contributes to her lack of balance. Thankfully, she has not had any serious falls.  Her major complaint is nausea when she eats. Even drinking water makes her nauseous. This has been a problem that has been gradually worsening over the last year or so. She has attributed to various medications, but I wonder whether she is developing diabetic gastroparesis.  She weighs 3 pounds less than at her previous appointment 6 months ago. She remains super obese with a BMI of around 50.  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 She also has systemic hypertension and left ventricular hypertrophy with chronic diastolic heart failure in the setting of morbid obesity and obstructive sleep apnea. Her most recent left ventricular ejection fraction was 60-65 percent by the echocardiogram performed in August 2016. Estimated systolic PA pressure was 36 mmHg.  Past Medical History  Diagnosis Date  . Hypertension   . Hyperlipemia   . Coronary artery disease     Medical Rx  . GERD (gastroesophageal reflux disease)   . Morbid obesity with BMI of 40.0-44.9, adult ([place])   . Anemia   . Diabetic retinopathy ([place])   . Chronic diastolic heart failure ([place]) 9/13    echo 03/20/15 LV Ef of 60-65%, grade 2DD and PA peak pressure: 36 mm Hg   . Blind right eye   . Type II diabetes mellitus ([place])   . Myocardial infarction ([place]) "several"  . Obstructive sleep apnea     "they say I do; I say I don't; no mask" (03/18/2015)  . History of blood transfusion "several"    "related to menopause"  . Chronic back pain     "my whole back" (03/18/2015)    Past Surgical History  Procedure Laterality Date  . Incision and drainage perirectal abscess  04/03/2012  . Partial hysterectomy  1981    abdominal  . Dilation and curettage of uterus    . Cardiac catheterization  July  2010  . Cataract extraction, bilateral Bilateral   . Eye surgery Bilateral "several"    Outpatient Prescriptions Prior to Visit  Medication Sig Dispense Refill  . acetaminophen (TYLENOL) 500 MG tablet Take 500 mg by mouth every 6 (six) hours as needed. For pain    . acetaZOLAMIDE (DIAMOX) 500 MG capsule Take 500 mg by mouth 2 (two) times daily.    Marland Burns aspirin 81 MG tablet Take 81 mg by mouth daily.    Marland Burns atorvastatin (LIPITOR) 80 MG tablet Take 1 tablet (80 mg total) by mouth daily at 6 PM. 90 tablet 3  . baclofen (LIORESAL)  10 MG tablet Take 1 tablet (10 mg total) by mouth at bedtime as needed for muscle spasms. 30 each 2  . bismuth subsalicylate (PEPTO BISMOL) 262 MG/15ML suspension Take 30 mLs by mouth every 6 (six) hours as needed.    . brimonidine-timolol (COMBIGAN) 0.2-0.5 % ophthalmic solution Place 1 drop into both eyes every 12 (twelve) hours.    . clopidogrel (PLAVIX) 75 MG tablet Take 1 tablet (75 mg total) by mouth daily. 90 tablet 3  . diltiazem (DILACOR XR) 240 MG 24 hr capsule TAKE ONE CAPSULE BY MOUTH ONCE DAILY 90 capsule 2  . ferrous sulfate 325 (65 FE) MG tablet Take 325 mg by mouth daily with breakfast.    . furosemide (LASIX) 80 MG tablet Take 1 tablet (80 mg total) by mouth daily. 30 tablet 9  . glucose blood (FREESTYLE LITE) test strip Use as instructed 100 each 12  . glucose monitoring kit (FREESTYLE) monitoring kit 1 each by Does not apply route 4 (four) times daily - after meals and at bedtime. 1 month Diabetic Testing Supplies for QAC-QHS accuchecks. Test up to 4 times daily E11.9 1 each 1  . hydrocortisone cream 1 % Apply 1 application topically daily as needed for itching.    . insulin lispro protamine-lispro (HUMALOG MIX 75/25) (75-25) 100 UNIT/ML SUSP injection Inject 65 Units into the skin 2 (two) times daily with a meal. 40 mL 1  . Insulin Syringe-Needle U-100 (RELION INSULIN SYR 1CC/30G) 30G X 5/16" 1 ML MISC USE AS DIRECTED 100 each 0  . isosorbide mononitrate (IMDUR) 60 MG 24 hr tablet Take 1 tablet (60 mg total) by mouth daily. 90 tablet 3  . LUMIGAN 0.01 % SOLN Place 1 drop into both eyes 2 (two) times daily.    . Menthol-Methyl Salicylate (MUSCLE RUB EX) Apply 1 application topically daily as needed (pain).    . metoprolol (TOPROL-XL) 200 MG 24 hr tablet TAKE ONE TABLET BY MOUTH ONCE DAILY 90 tablet 3  . NITROSTAT 0.4 MG SL tablet DISSOLVE ONE TABLET UNDER THE TONGUE EVERY 5 MINUTES AS NEEDED FOR CHEST PAIN.  DO NOT EXCEED A TOTAL OF 3 DOSES IN 15 MINUTES 25 tablet 5  .  pantoprazole (PROTONIX) 40 MG tablet Take 1 tablet (40 mg total) by mouth daily. 30 tablet 7  . polyethylene glycol (MIRALAX / GLYCOLAX) packet Take 17 g by mouth daily as needed for mild constipation.    . polyvinyl alcohol (LIQUIFILM TEARS) 1.4 % ophthalmic solution Place 1 drop into both eyes daily as needed. For dry eyes    . RANEXA 500 MG 12 hr tablet TAKE ONE TABLET BY MOUTH TWICE DAILY 60 tablet 11  . RELION INSULIN SYR 1CC/30G 30G X 5/16" 1 ML MISC USE AS DIRECTED 100 each 2   No facility-administered medications prior to visit.     Allergies:  Lorazepam and Orange fruit   Social History   Social History  . Marital Status: Married    Spouse Name: N/A  . Number of Children: 0  . Years of Education: N/A   Social History Main Topics  . Smoking status: Never Smoker   . Smokeless tobacco: Never Used  . Alcohol Use: No  . Drug Use: No  . Sexual Activity: Yes   Other Topics Concern  . None   Social History Narrative     Family History:  The patient's family history includes Colon polyps in her father; Diabetes in her brother, father, maternal grandmother, mother, and sister; Heart attack in her father and mother; Heart disease in her brother, father, mother, and sister; Hypertension in her brother, father, maternal grandmother, mother, and sister; Stroke in her father.   ROS:   Please see the history of present illness.    ROS All other systems reviewed and are negative.   PHYSICAL EXAM:   VS:  BP 130/80 mmHg  Pulse 69  Ht '5\' 3"'  (1.6 m)  Wt 277 lb 1.6 oz (125.692 kg)  BMI 49.10 kg/m2  LMP 04/02/2012   GEN: Well nourished, well developed, in no acute distress HEENT: normal Neck: no JVD, carotid bruits, or masses Cardiac: RRR; no murmurs, rubs, or gallops,symmetrical 2+ ankle and pedal edema  Respiratory:  clear to auscultation bilaterally, normal work of breathing GI: soft, nontender, nondistended, + BS MS: no deformity or atrophy Skin: warm and dry, no  rash Neuro:  Alert and Oriented x 3, Strength and sensation are intact Psych: euthymic mood, full affect  Wt Readings from Last 3 Encounters:  08/25/15 277 lb 1.6 oz (125.692 kg)  05/29/15 280 lb (127.007 kg)  03/31/15 283 lb (128.368 kg)      Studies/Labs Reviewed:   EKG:  EKG is ordered today.  The ekg ordered today demonstrates normal sinus rhythm, incomplete right bundle branch block, lateral ST depression and T-wave inversion V3-V6, not much change from previous tracings. The QTc interval is 443 ms  Recent Labs: 03/18/2015: ALT 12*; B Natriuretic Peptide 21.8 03/19/2015: Hemoglobin 12.0; Platelets 246 04/11/2015: BUN 13; Creatinine, Ser 1.19; Potassium 3.6; Sodium 138   Lipid Panel    Component Value Date/Time   CHOL 100 11/14/2014 0920   TRIG 73 11/14/2014 0920   HDL 41* 11/14/2014 0920   CHOLHDL 2.4 11/14/2014 0920   VLDL 15 11/14/2014 0920   LDLCALC 44 11/14/2014 0920    ASSESSMENT:    1. Chronic diastolic heart failure, NYHA class 3 ([place])   2. Chronic renal insufficiency, stage III (moderate)   3. Atherosclerosis of native coronary artery of native heart with other form of angina pectoris ([place])   4. Uncontrolled type 2 diabetes mellitus with stage 3 chronic kidney disease, with long-term current use of insulin ([place])   5. Dyslipidemia      PLAN:  In order of problems listed above:  1. CHF: NYHA class 2, clearly with signs of hypervolemia, which could be related either to cardiac or renal disease. Discussed sodium restriction. I am reluctant to increase her diuretic dose unless she develops symptoms of dyspnea. 2. CKD stg. 3: her last creatinine was actually better than previous baseline. Time to reevaluate. 3. CAD: due to diffuse nature of disease, not a candidate for er surgical or percutaneous revascularization. Currently CCS class I-II on 4 different antianginal drugs. 4. DM: she is getting a new primary care provider so I offered to check her hemoglobin A1c.  I  wonder if her symptoms of nausea are related to diabetic gastroparesis. I'm reluctant to prescribe metoclopramide without a firm diagnosis, due to the risk of complications including QT interval prolongation while on treatment with Ranexa. Having said that, her current QT interval is normal and would not preclude use of metoclopramide if necessary. 5. HLP: repeat lipid profile and liver tests. Most recent LDL was excellent at 44 on current statin regimen    Medication Adjustments/Labs and Tests Ordered: Current medicines are reviewed at length with the patient today.  Concerns regarding medicines are outlined above.  Medication changes, Labs and Tests ordered today are listed in the Patient Instructions below. There are no Patient Instructions on file for this visit.     Mikael Spray, MD  08/25/2015 9:32 AM    [facility] [place], [place], [place]  [address] Phone: [phone]; Fax: [phone]

## 2015-08-25 NOTE — Patient Instructions (Signed)
Your physician recommends that you return for lab work in: AT [place].  Dr. Sallyanne Kuster recommends that you schedule a follow-up appointment in: 6 MONTHS  SEE A GASTROENTEROLOGIST TO BE ASSESSED FOR DIABETIC GASTROPARESIS.

## 2015-09-12 ENCOUNTER — Encounter: Payer: Self-pay | Admitting: Family Medicine

## 2015-09-12 ENCOUNTER — Other Ambulatory Visit: Payer: Self-pay | Admitting: Family Medicine

## 2015-09-12 ENCOUNTER — Ambulatory Visit: Payer: Medicare Other | Attending: Family Medicine | Admitting: Family Medicine

## 2015-09-12 VITALS — BP 150/83 | HR 72 | Temp 98.3°F | Resp 16 | Ht 63.0 in | Wt 277.0 lb

## 2015-09-12 DIAGNOSIS — Z1159 Encounter for screening for other viral diseases: Secondary | ICD-10-CM | POA: Diagnosis not present

## 2015-09-12 DIAGNOSIS — Z6841 Body Mass Index (BMI) 40.0 and over, adult: Secondary | ICD-10-CM | POA: Insufficient documentation

## 2015-09-12 DIAGNOSIS — H5441 Blindness, right eye, normal vision left eye: Secondary | ICD-10-CM | POA: Diagnosis not present

## 2015-09-12 DIAGNOSIS — E118 Type 2 diabetes mellitus with unspecified complications: Secondary | ICD-10-CM

## 2015-09-12 DIAGNOSIS — E669 Obesity, unspecified: Secondary | ICD-10-CM | POA: Diagnosis not present

## 2015-09-12 DIAGNOSIS — Z23 Encounter for immunization: Secondary | ICD-10-CM | POA: Insufficient documentation

## 2015-09-12 DIAGNOSIS — Z79899 Other long term (current) drug therapy: Secondary | ICD-10-CM | POA: Diagnosis not present

## 2015-09-12 DIAGNOSIS — Z794 Long term (current) use of insulin: Secondary | ICD-10-CM | POA: Diagnosis not present

## 2015-09-12 DIAGNOSIS — K3184 Gastroparesis: Secondary | ICD-10-CM | POA: Insufficient documentation

## 2015-09-12 DIAGNOSIS — Z0000 Encounter for general adult medical examination without abnormal findings: Secondary | ICD-10-CM | POA: Diagnosis not present

## 2015-09-12 DIAGNOSIS — Z7982 Long term (current) use of aspirin: Secondary | ICD-10-CM | POA: Insufficient documentation

## 2015-09-12 DIAGNOSIS — K5900 Constipation, unspecified: Secondary | ICD-10-CM | POA: Diagnosis not present

## 2015-09-12 DIAGNOSIS — E1143 Type 2 diabetes mellitus with diabetic autonomic (poly)neuropathy: Secondary | ICD-10-CM | POA: Diagnosis not present

## 2015-09-12 DIAGNOSIS — Z1231 Encounter for screening mammogram for malignant neoplasm of breast: Secondary | ICD-10-CM

## 2015-09-12 DIAGNOSIS — G4733 Obstructive sleep apnea (adult) (pediatric): Secondary | ICD-10-CM | POA: Insufficient documentation

## 2015-09-12 LAB — COMPLETE METABOLIC PANEL WITH GFR
ALBUMIN: 3.4 g/dL — AB (ref 3.6–5.1)
ALK PHOS: 55 U/L (ref 33–130)
ALT: 8 U/L (ref 6–29)
AST: 11 U/L (ref 10–35)
BILIRUBIN TOTAL: 0.3 mg/dL (ref 0.2–1.2)
BUN: 14 mg/dL (ref 7–25)
CO2: 24 mmol/L (ref 20–31)
CREATININE: 1.19 mg/dL — AB (ref 0.50–0.99)
Calcium: 8.8 mg/dL (ref 8.6–10.4)
Chloride: 106 mmol/L (ref 98–110)
GFR, Est African American: 57 mL/min — ABNORMAL LOW (ref >=60)
GFR, Est Non African American: 50 mL/min — ABNORMAL LOW (ref >=60)
GLUCOSE: 129 mg/dL — AB (ref 65–99)
Potassium: 3.9 mmol/L (ref 3.5–5.3)
SODIUM: 138 mmol/L (ref 135–146)
TOTAL PROTEIN: 6.8 g/dL (ref 6.1–8.1)

## 2015-09-12 LAB — LIPID PANEL
Cholesterol: 106 mg/dL — ABNORMAL LOW (ref 125–200)
HDL: 37 mg/dL — ABNORMAL LOW (ref >=46)
LDL CALC: 57 mg/dL (ref <130)
TRIGLYCERIDES: 58 mg/dL (ref <150)
Total CHOL/HDL Ratio: 2.9 Ratio (ref <=5.0)
VLDL: 12 mg/dL (ref <30)

## 2015-09-12 LAB — GLUCOSE, POCT (MANUAL RESULT ENTRY): POC GLUCOSE: 154 mg/dL — AB (ref 70–99)

## 2015-09-12 LAB — POCT GLYCOSYLATED HEMOGLOBIN (HGB A1C): Hemoglobin A1C: 6.7

## 2015-09-12 MED ORDER — "INSULIN SYRINGE-NEEDLE U-100 30G X 5/16"" 1 ML MISC": 1.0000 | Freq: Two times a day (BID) | Status: DC

## 2015-09-12 MED ORDER — FREESTYLE SYSTEM KIT: 1.0000 | PACK | Freq: Three times a day (TID) | Status: AC

## 2015-09-12 MED ORDER — GLUCOSE BLOOD VI STRP: ORAL_STRIP | Status: DC

## 2015-09-12 MED ORDER — INSULIN LISPRO PROT & LISPRO (75-25 MIX) 100 UNIT/ML ~~LOC~~ SUSP: 65.0000 [IU] | Freq: Two times a day (BID) | SUBCUTANEOUS | Status: DC

## 2015-09-12 NOTE — Progress Notes (Signed)
Subjective:  Patient ID: [name], female    DOB: 1956/05/30  Age: 60 y.o. MRN: [id number]  CC: Diabetes   HPI [name] presents for   1. 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 Stomach upset: for 6 + months. Has constipation. Regurgitates food. Has discomfort. Diabetes for many decades. Has not had screening colonoscopy due to difficult prep candidate.  3. OSA: not using CPAP. Sleeps with 5 pillows. Snores.   4. Skin tags: on neck and face. Desires removal. Non tender.  5. Facial hair: on chin and neck. Obese. No exercise. This is distressing to patient.   Social History  Substance Use Topics  . Smoking status: Never Smoker   . Smokeless tobacco: Never Used  . Alcohol Use: No   Outpatient Prescriptions Prior to Visit  Medication Sig Dispense Refill  . acetaminophen (TYLENOL) 500 MG tablet Take 500 mg by mouth every 6 (six) hours as needed. For pain    . acetaZOLAMIDE (DIAMOX) 500 MG capsule Take 500 mg by mouth 2 (two) times daily.    Marland Kitchen aspirin 81 MG tablet Take 81 mg by mouth daily.    Marland Kitchen atorvastatin (LIPITOR) 80 MG tablet Take 1 tablet (80 mg total) by mouth daily at 6 PM. 90 tablet 3  . baclofen (LIORESAL) 10 MG tablet Take 1 tablet (10 mg total) by mouth at bedtime as needed for muscle spasms. 30 each 2  . bismuth subsalicylate (PEPTO BISMOL) 262 MG/15ML suspension Take 30 mLs by mouth every 6 (six) hours as needed.    . brimonidine-timolol (COMBIGAN) 0.2-0.5 % ophthalmic solution Place 1 drop into both eyes every 12 (twelve) hours.    . calcium carbonate (TUMS - DOSED IN MG ELEMENTAL CALCIUM) 500 MG chewable tablet Chew 1 tablet by mouth 2 (two)  times daily.    . clopidogrel (PLAVIX) 75 MG tablet Take 1 tablet (75 mg total) by mouth daily. 90 tablet 3  . diltiazem (DILACOR XR) 240 MG 24 hr capsule TAKE ONE CAPSULE BY MOUTH ONCE DAILY 90 capsule 2  . ferrous sulfate 325 (65 FE) MG tablet Take 325 mg by mouth daily with breakfast.    . furosemide (LASIX) 80 MG tablet Take 1 tablet (80 mg total) by mouth daily. 90 tablet 3  . hydrocortisone cream 1 % Apply 1 application topically daily as needed for itching.    . insulin lispro protamine-lispro (HUMALOG MIX 75/25) (75-25) 100 UNIT/ML SUSP injection Inject 65 Units into the skin 2 (two) times daily with a meal. 40 mL 1  . Insulin Syringe-Needle U-100 (RELION INSULIN SYR 1CC/30G) 30G X 5/16" 1 ML MISC USE AS DIRECTED 100 each 0  . isosorbide mononitrate (IMDUR) 60 MG 24 hr tablet Take 1 tablet (60 mg total) by mouth daily. 90 tablet 3  . LUMIGAN 0.01 % SOLN Place 1 drop into both eyes 2 (two) times daily.    . Menthol-Methyl Salicylate (MUSCLE RUB EX) Apply 1 application topically daily as needed (pain).    . metoprolol (TOPROL-XL) 200 MG 24 hr tablet Take 1 tablet (200 mg total) by mouth daily. 90 tablet 3  .  nitroGLYCERIN (NITROSTAT) 0.4 MG SL tablet DISSOLVE ONE TABLET UNDER THE TONGUE EVERY 5 MINUTES AS NEEDED FOR CHEST PAIN.  DO NOT EXCEED A TOTAL OF 3 DOSES IN 15 MINUTES 25 tablet 5  . pantoprazole (PROTONIX) 40 MG tablet Take 1 tablet (40 mg total) by mouth daily. 30 tablet 7  . polyethylene glycol (MIRALAX / GLYCOLAX) packet Take 17 g by mouth daily as needed for mild constipation.    . polyvinyl alcohol (LIQUIFILM TEARS) 1.4 % ophthalmic solution Place 1 drop into both eyes daily as needed. For dry eyes    . ranolazine (RANEXA) 500 MG 12 hr tablet Take 1 tablet (500 mg total) by mouth 2 (two) times daily. 180 tablet 3  . RELION INSULIN SYR 1CC/30G 30G X 5/16" 1 ML MISC USE AS DIRECTED 100 each 2  . glucose blood (FREESTYLE LITE) test strip Use as instructed (Patient not taking:  Reported on 09/12/2015) 100 each 12  . glucose monitoring kit (FREESTYLE) monitoring kit 1 each by Does not apply route 4 (four) times daily - after meals and at bedtime. 1 month Diabetic Testing Supplies for QAC-QHS accuchecks. Test up to 4 times daily E11.9 (Patient not taking: Reported on 09/12/2015) 1 each 1   No facility-administered medications prior to visit.    ROS Review of Systems  Constitutional: Negative for fever and chills.  Eyes: Negative for visual disturbance.  Respiratory: Negative for shortness of breath.   Cardiovascular: Negative for chest pain.  Gastrointestinal: Positive for nausea, vomiting, abdominal pain and constipation. Negative for diarrhea, blood in stool, abdominal distention, anal bleeding and rectal pain.  Musculoskeletal: Negative for back pain and arthralgias.  Skin: Negative for rash.       Itching in L ankle   Allergic/Immunologic: Negative for immunocompromised state.  Neurological: Positive for numbness (in hands in AM ).  Hematological: Negative for adenopathy. Does not bruise/bleed easily.  Psychiatric/Behavioral: Negative for suicidal ideas and dysphoric mood.    Objective:  BP 150/83 mmHg  Pulse 72  Temp(Src) 98.3 F (36.8 C) (Oral)  Resp 16  Ht '5\' 3"'  (1.6 m)  Wt 277 lb (125.646 kg)  BMI 49.08 kg/m2  SpO2 99%  LMP 04/02/2012  BP/Weight 09/12/2015 08/25/2015 83/04/4075  Systolic BP 808 811 031  Diastolic BP 83 80 76  Wt. (Lbs) 277 277.1 280  BMI 49.08 49.1 49.61   Physical Exam  Constitutional: She is oriented to person, place, and time. She appears well-developed and well-nourished. No distress.  Obese   HENT:  Head: Normocephalic and atraumatic.  Cardiovascular: Normal rate, regular rhythm, normal heart sounds and intact distal pulses.   Pulmonary/Chest: Effort normal and breath sounds normal.  Musculoskeletal: She exhibits no edema.  Neurological: She is alert and oriented to person, place, and time.  Skin: Skin is warm and  dry. No rash noted.     Psychiatric: She has a normal mood and affect.    Lab Results  Component Value Date   HGBA1C 6.70 09/12/2015   CBG 154  Assessment & Plan:   [name] was seen today for diabetes.  Diagnoses and all orders for this visit:  Controlled type 2 diabetes mellitus with complication, with long-term current use of insulin (HCC) -     Glucose (CBG) -     HgB A1c -     Cancel: Microalbumin/Creatinine Ratio, Urine -     Lipid Panel -     COMPLETE METABOLIC PANEL WITH GFR -     glucose blood (FREESTYLE  TEST STRIPS) test strip; Use as instructed -     insulin lispro protamine-lispro (HUMALOG MIX 75/25) (75-25) 100 UNIT/ML SUSP injection; Inject 65 Units into the skin 2 (two) times daily with a meal. -     Insulin Syringe-Needle U-100 (RELION INSULIN SYR 1CC/30G) 30G X 5/16" 1 ML MISC; 1 each by Other route 2 (two) times daily. USE AS DIRECTED -     glucose monitoring kit (FREESTYLE) monitoring kit; 1 each by Does not apply route 4 (four) times daily - after meals and at bedtime. -     Microalbumin/Creatinine Ratio, Urine; Future  Healthcare maintenance -     Flu Vaccine QUAD 36+ mos IM  Diabetic gastroparesis associated with type 2 diabetes mellitus ([place])  Need for hepatitis C screening test -     Hepatitis C antibody, reflex    No orders of the defined types were placed in this encounter.    Follow-up: No Follow-up on file.   Boykin Nearing MD

## 2015-09-12 NOTE — Progress Notes (Signed)
F/U DM  Medicine refills  No pain today  No suicidal thought in the past two weeks No tobacco user

## 2015-09-12 NOTE — Assessment & Plan Note (Signed)
GI history consistent with diabetic gastroparesis  Plan: Dietary modifications, information provided

## 2015-09-12 NOTE — Patient Instructions (Addendum)
[name] was seen today for diabetes.  Diagnoses and all orders for this visit:  Controlled type 2 diabetes mellitus with complication, with long-term current use of insulin (HCC) -     Glucose (CBG) -     HgB A1c -     Microalbumin/Creatinine Ratio, Urine -     Lipid Panel -     COMPLETE METABOLIC PANEL WITH GFR -     glucose blood (FREESTYLE TEST STRIPS) test strip; Use as instructed -     insulin lispro protamine-lispro (HUMALOG MIX 75/25) (75-25) 100 UNIT/ML SUSP injection; Inject 65 Units into the skin 2 (two) times daily with a meal. -     Insulin Syringe-Needle U-100 (RELION INSULIN SYR 1CC/30G) 30G X 5/16" 1 ML MISC; 1 each by Other route 2 (two) times daily. USE AS DIRECTED -     glucose monitoring kit (FREESTYLE) monitoring kit; 1 each by Does not apply route 4 (four) times daily - after meals and at bedtime.  Healthcare maintenance -     Flu Vaccine QUAD 36+ mos IM  Diabetic gastroparesis associated with type 2 diabetes mellitus ([place])  Need for hepatitis C screening test -     Hepatitis C antibody, reflex    F/u in 3-4 weeks for pap, also need skin tag removal, 30 minute visit   Dr. Adrian Blackwater   Gastroparesis Gastroparesis, also called delayed gastric emptying, is a condition in which food takes longer than normal to empty from the stomach. The condition is usually long-lasting (chronic). CAUSES This condition may be caused by:  An endocrine disorder, such as hypothyroidism or diabetes. Diabetes is the most common cause of this condition.  A nervous system disease, such as Parkinson disease or multiple sclerosis.  Cancer, infection, or surgery of the stomach or vagus nerve.  A connective tissue disorder, such as scleroderma.  Certain medicines. In most cases, the cause is not known. RISK FACTORS This condition is more likely to develop in:  People with certain disorders, including endocrine disorders, eating disorders, amyloidosis, and scleroderma.  People with  certain diseases, including Parkinson disease or multiple sclerosis.  People with cancer or infection of the stomach or vagus nerve.  People who have had surgery on the stomach or vagus nerve.  People who take certain medicines.  Women. SYMPTOMS Symptoms of this condition include:  An early feeling of fullness when eating.  Nausea.  Weight loss.  Vomiting.  Heartburn.  Abdominal bloating.  Inconsistent blood glucose levels.  Lack of appetite.  Acid from the stomach coming up into the esophagus (gastroesophageal reflux).  Spasms of the stomach. Symptoms may come and go. DIAGNOSIS This condition is diagnosed with tests, such as:  Tests that check how long it takes food to move through the stomach and intestines. These tests include:  Upper gastrointestinal (GI) series. In this test, X-rays of the intestines are taken after you drink a liquid. The liquid makes the intestines show up better on the X-rays.  Gastric emptying scintigraphy. In this test, scans are taken after you eat food that contains a small amount of radioactive material.  Wireless capsule GI monitoring system. This test involves swallowing a capsule that records information about movement through the stomach.  Gastric manometry. This test measures electrical and muscular activity in the stomach. It is done with a thin tube that is passed down the throat and into the stomach.  Endoscopy. This test checks for abnormalities in the lining of the stomach. It is done with a  long, thin tube that is passed down the throat and into the stomach.  An ultrasound. This test can help rule out gallbladder disease or pancreatitis as a cause of your symptoms. It uses sound waves to take pictures of the inside of your body. TREATMENT There is no cure for gastroparesis. This condition may be managed with:  Treatment of the underlying condition causing the gastroparesis.  Lifestyle changes, including exercise and dietary  changes. Dietary changes can include:  Changes in what and when you eat.  Eating smaller meals more often.  Eating low-fat foods.  Eating low-fiber forms of high-fiber foods, such as cooked vegetables instead of raw vegetables.  Having liquid foods in place of solid foods. Liquid foods are easier to digest.  Medicines. These may be given to control nausea and vomiting and to stimulate stomach muscles.  Getting food through a feeding tube. This may be done in severe cases.  A gastric neurostimulator. This is a device that is inserted into the body with surgery. It helps improve stomach emptying and control nausea and vomiting. HOME CARE INSTRUCTIONS  Follow your health care provider's instructions about exercise and diet.  Take medicines only as directed by your health care provider. SEEK MEDICAL CARE IF:  Your symptoms do not improve with treatment.  You have new symptoms. SEEK IMMEDIATE MEDICAL CARE IF:  You have severe abdominal pain that does not improve with treatment.  You have nausea that does not go away.  You cannot keep fluids down.   This information is not intended to replace advice given to you by your health care provider. Make sure you discuss any questions you have with your health care provider.   Document Released: 07/19/2005 Document Revised: 12/03/2014 Document Reviewed: 07/15/2014 Elsevier Interactive Patient Education [employer].

## 2015-09-12 NOTE — Assessment & Plan Note (Signed)
Diabetes is controlled Continue current regimen Foot exam done today

## 2015-09-13 LAB — HEPATITIS C ANTIBODY: HCV AB: NEGATIVE

## 2015-09-16 DIAGNOSIS — H35372 Puckering of macula, left eye: Secondary | ICD-10-CM | POA: Diagnosis not present

## 2015-09-16 DIAGNOSIS — H401133 Primary open-angle glaucoma, bilateral, severe stage: Secondary | ICD-10-CM | POA: Diagnosis not present

## 2015-09-16 DIAGNOSIS — H5410 Blindness, one eye, low vision other eye, unspecified eyes: Secondary | ICD-10-CM | POA: Diagnosis not present

## 2015-09-17 ENCOUNTER — Other Ambulatory Visit: Payer: Self-pay | Admitting: *Deleted

## 2015-09-17 ENCOUNTER — Telehealth: Payer: Self-pay | Admitting: *Deleted

## 2015-09-17 MED ORDER — ACCU-CHEK AVIVA PLUS W/DEVICE KIT: PACK | Status: DC

## 2015-09-17 MED ORDER — GLUCOSE BLOOD VI STRP: ORAL_STRIP | Status: DC

## 2015-09-17 MED ORDER — ACCU-CHEK FASTCLIX LANCETS MISC: 1.0000 | Freq: Three times a day (TID) | Status: DC

## 2015-09-17 NOTE — Telephone Encounter (Signed)
Pt stated will return call

## 2015-09-17 NOTE — Telephone Encounter (Signed)
-----   Message from Boykin Nearing, MD sent at 09/15/2015  8:42 AM EST ----- Screening hep C negative Well controlled LDL, low HDL Stable Cr with moderate CKD Continue current treatment plan Focus on increasing physical activity and lowering calories with goal of weight loss   Results shared with patient's cardiologist

## 2015-09-26 ENCOUNTER — Ambulatory Visit
Admission: RE | Admit: 2015-09-26 | Discharge: 2015-09-26 | Disposition: A | Discharge status: Home / Self Care | Payer: Medicare Other | Source: Ambulatory Visit | Attending: Family Medicine | Admitting: Family Medicine

## 2015-09-26 DIAGNOSIS — Z1231 Encounter for screening mammogram for malignant neoplasm of breast: Secondary | ICD-10-CM | POA: Diagnosis not present

## 2015-10-07 ENCOUNTER — Telehealth: Payer: Self-pay | Admitting: Family Medicine

## 2015-10-07 DIAGNOSIS — N950 Postmenopausal bleeding: Secondary | ICD-10-CM

## 2015-10-07 NOTE — Telephone Encounter (Signed)
Patient's physician states that during her house call she believes patient should be referred to [facility] for postmenopausal bleeding...Marland KitchenMarland KitchenMarland Kitchenplease contact patient [phone]

## 2015-10-09 NOTE — Telephone Encounter (Signed)
Referral placed.

## 2015-10-22 ENCOUNTER — Encounter: Payer: Self-pay | Admitting: Obstetrics & Gynecology

## 2015-10-29 ENCOUNTER — Other Ambulatory Visit: Payer: Self-pay

## 2015-10-29 ENCOUNTER — Telehealth: Payer: Self-pay

## 2015-10-29 DIAGNOSIS — Z79899 Other long term (current) drug therapy: Secondary | ICD-10-CM | POA: Diagnosis not present

## 2015-10-29 DIAGNOSIS — N183 Chronic kidney disease, stage 3 (moderate): Secondary | ICD-10-CM | POA: Diagnosis not present

## 2015-10-29 LAB — COMPREHENSIVE METABOLIC PANEL WITH GFR
ALT: 10 IU/L (ref 0–32)
AST: 10 IU/L (ref 0–40)
Albumin/Globulin Ratio: 1.1 — ABNORMAL LOW (ref 1.2–2.2)
Albumin: 3.7 g/dL (ref 3.6–4.8)
Alkaline Phosphatase: 63 IU/L (ref 39–117)
BUN/Creatinine Ratio: 8 — ABNORMAL LOW (ref 11–26)
BUN: 10 mg/dL (ref 8–27)
Bilirubin Total: 0.2 mg/dL (ref 0.0–1.2)
CO2: 22 mmol/L (ref 18–29)
Calcium: 8.7 mg/dL (ref 8.7–10.3)
Chloride: 107 mmol/L — ABNORMAL HIGH (ref 96–106)
Creatinine, Ser: 1.18 mg/dL — ABNORMAL HIGH (ref 0.57–1.00)
GFR calc Af Amer: 58 mL/min/1.73 — ABNORMAL LOW
GFR calc non Af Amer: 50 mL/min/1.73 — ABNORMAL LOW
Globulin, Total: 3.3 g/dL (ref 1.5–4.5)
Glucose: 141 mg/dL — ABNORMAL HIGH (ref 65–99)
Potassium: 3.8 mmol/L (ref 3.5–5.2)
Sodium: 137 mmol/L (ref 134–144)
Total Protein: 7 g/dL (ref 6.0–8.5)

## 2015-10-29 LAB — HEMOGLOBIN A1C
Est. average glucose Bld gHb Est-mCnc: 146 mg/dL
Hgb A1c MFr Bld: 6.7 % — ABNORMAL HIGH (ref 4.8–5.6)

## 2015-10-29 NOTE — Telephone Encounter (Signed)
Call from [place] pt in their lab and need labs transferred from [place]. Reordered CMT, A1c, and Lipid Profile.

## 2015-10-30 LAB — LIPID PANEL
CHOL/HDL RATIO: 2.6 ratio (ref 0.0–4.4)
Cholesterol, Total: 103 mg/dL (ref 100–199)
HDL: 40 mg/dL (ref >39)
LDL CALC: 48 mg/dL (ref 0–99)
TRIGLYCERIDES: 75 mg/dL (ref 0–149)
VLDL Cholesterol Cal: 15 mg/dL (ref 5–40)

## 2015-11-17 DIAGNOSIS — E119 Type 2 diabetes mellitus without complications: Secondary | ICD-10-CM | POA: Diagnosis not present

## 2015-11-17 DIAGNOSIS — E876 Hypokalemia: Secondary | ICD-10-CM | POA: Diagnosis not present

## 2015-11-17 DIAGNOSIS — N183 Chronic kidney disease, stage 3 (moderate): Secondary | ICD-10-CM | POA: Diagnosis not present

## 2015-11-17 DIAGNOSIS — I10 Essential (primary) hypertension: Secondary | ICD-10-CM | POA: Diagnosis not present

## 2015-11-20 ENCOUNTER — Encounter: Payer: Medicare Other | Admitting: Obstetrics & Gynecology

## 2015-11-28 ENCOUNTER — Other Ambulatory Visit: Payer: Self-pay

## 2015-11-28 ENCOUNTER — Ambulatory Visit (INDEPENDENT_AMBULATORY_CARE_PROVIDER_SITE_OTHER): Payer: Medicare Other | Admitting: Family Medicine

## 2015-11-28 ENCOUNTER — Other Ambulatory Visit (HOSPITAL_COMMUNITY)
Admission: RE | Admit: 2015-11-28 | Discharge: 2015-11-28 | Disposition: A | Discharge status: Home / Self Care | Payer: Medicare Other | Source: Ambulatory Visit | Attending: Family Medicine | Admitting: Family Medicine

## 2015-11-28 ENCOUNTER — Telehealth: Payer: Self-pay | Admitting: Obstetrics & Gynecology

## 2015-11-28 ENCOUNTER — Encounter: Payer: Self-pay | Admitting: Family Medicine

## 2015-11-28 VITALS — BP 194/84 | HR 75 | Wt 271.4 lb

## 2015-11-28 DIAGNOSIS — N950 Postmenopausal bleeding: Secondary | ICD-10-CM

## 2015-11-28 DIAGNOSIS — N841 Polyp of cervix uteri: Secondary | ICD-10-CM

## 2015-11-28 DIAGNOSIS — Z01419 Encounter for gynecological examination (general) (routine) without abnormal findings: Secondary | ICD-10-CM | POA: Diagnosis not present

## 2015-11-28 DIAGNOSIS — Z1151 Encounter for screening for human papillomavirus (HPV): Secondary | ICD-10-CM | POA: Diagnosis not present

## 2015-11-28 DIAGNOSIS — Z124 Encounter for screening for malignant neoplasm of cervix: Secondary | ICD-10-CM

## 2015-11-28 LAB — POCT PREGNANCY, URINE: PREG TEST UR: NEGATIVE

## 2015-11-28 NOTE — Telephone Encounter (Signed)
Error

## 2015-11-28 NOTE — Telephone Encounter (Deleted)
[facility]ulty [facility] OB/GYN Attending Phone Call Documentation  I received a call from [facility]ology regarding endometrial biopsy performed on  [place] on 11/28/2015, with report of ***.   Verita Schneiders, MD, [place] Attending Obstetrician & Gynecologist, [place], [place] for [employer]

## 2015-11-28 NOTE — Progress Notes (Signed)
Subjective:    Patient ID: [name], female    DOB: [birth year], 60 y.o.   MRN: [id number]  HPI Patient seen for intermittent vaginal bleeding, Which has been on for several years. The patient had initially been evaluated for this in 2011. She had an endometrial biopsy that was negative and a ultrasound that showed a thickened endometrium. She develops the bleeding couple times a month and last for 5-10 days. She does pass heavy clots and has significant cramping with this. She is uncertain as to when she entered menopause. No palliating or provoking factors.    Review of Systems  Constitutional: Negative for fever, chills and fatigue.  Respiratory: Negative for chest tightness and shortness of breath.   Gastrointestinal: Negative for nausea, vomiting, abdominal pain, diarrhea and constipation.  Genitourinary: Negative for dysuria, vaginal discharge, vaginal pain, pelvic pain and dyspareunia.  All other systems reviewed and are negative.  I have reviewed the patients past medical, family, and social history.  I have reviewed the patient's medication list and allergies.      Objective:   Physical Exam  Constitutional: She appears well-developed and well-nourished.  HENT:  Head: Normocephalic and atraumatic.  Right Ear: External ear normal.  Left Ear: External ear normal.  Cardiovascular: Normal rate and normal heart sounds.   Pulmonary/Chest: Effort normal and breath sounds normal. No respiratory distress. She has no wheezes. She has no rales. She exhibits no tenderness.  Abdominal: Soft. Bowel sounds are normal. She exhibits no distension and no mass. There is no tenderness. There is no rebound and no guarding.  Genitourinary: No labial fusion. There is no rash, tenderness, lesion or injury on the right labia. There is no rash, tenderness, lesion or injury on the left labia. No erythema, tenderness or bleeding in the vagina. No foreign body around the vagina. No signs of injury  around the vagina. No vaginal discharge found.    Skin: Skin is warm and dry.  Psychiatric: She has a normal mood and affect. Her behavior is normal. Judgment and thought content normal.      Assessment & Plan:  1. Postmenopausal bleeding Endometrial biopsy obtained.  [name] ordered.  Management pending results.  - Surgical pathology - US Pelvis Complete; Future - US Transvaginal Non-OB; Future - Cytology - PAP  2. Cervical polyp Removed.   - Surgical pathology  3.  Hypertension After the visit, it was noticed that her BP was elevated.  I called the patient - patient is asymptomatic.  She had not taken her BP medications this morning before seeing me.  After she returned home, she took her medications.  Advised the patient that if she were to develop headache, chest pain, SOB, or weakness, she should go to ED.    CERVICAL POLYPECTOMY NOTE Risks of the biopsy including pain, bleeding, infection, inadequate specimen, and need for additional procedures were discussed. The patient stated understanding and agreed to undergo procedure today. Consent was signed,  time out performed.  The patient's cervix was prepped with Betadine.  Ring forceps were used to grasp the polypoid lesion and the polypoid lesions was removed by twisting it off its base.  Small bleeding was noted and hemostasis was achieved using silver nitrate sticks.  The patient tolerated the procedure well. Post-procedure instructions were given to the patient. The patient is to call with heavy bleeding, fever greater than 100.4, foul smelling vaginal discharge or other concerns. The patient will be return to clinic in two weeks for discussion  of results.   ENDOMETRIAL BIOPSY     The indications for endometrial biopsy were reviewed.   Risks of the biopsy including cramping, bleeding, infection, uterine perforation, inadequate specimen and need for additional procedures  were discussed. The patient states she understands and agrees to  undergo procedure today. Consent was signed. Time out was performed. Urine HCG was negative. A sterile speculum was placed in the patient's vagina and the cervix was prepped with Betadine. A single-toothed tenaculum was placed on the anterior lip of the cervix to stabilize it. The 3 mm pipelle was introduced into the endometrial cavity without difficulty to a depth of 7 cm, and a moderate amount of tissue was obtained and sent to pathology. The instruments were removed from the patient's vagina. Minimal bleeding from the cervix was noted. The patient tolerated the procedure well. Routine post-procedure instructions were given to the patient. The patient will follow up to review the results and for further management.

## 2015-11-30 ENCOUNTER — Other Ambulatory Visit: Payer: Self-pay | Admitting: Cardiovascular Disease

## 2015-12-02 LAB — CYTOLOGY - PAP

## 2015-12-05 ENCOUNTER — Ambulatory Visit (HOSPITAL_COMMUNITY): Payer: Medicare Other

## 2015-12-09 ENCOUNTER — Ambulatory Visit (HOSPITAL_COMMUNITY)
Admission: RE | Admit: 2015-12-09 | Discharge: 2015-12-09 | Disposition: A | Discharge status: Home / Self Care | Payer: Medicare Other | Source: Ambulatory Visit | Attending: Family Medicine | Admitting: Family Medicine

## 2015-12-09 DIAGNOSIS — N950 Postmenopausal bleeding: Secondary | ICD-10-CM | POA: Diagnosis not present

## 2015-12-09 DIAGNOSIS — D259 Leiomyoma of uterus, unspecified: Secondary | ICD-10-CM | POA: Diagnosis not present

## 2015-12-09 DIAGNOSIS — R938 Abnormal findings on diagnostic imaging of other specified body structures: Secondary | ICD-10-CM | POA: Insufficient documentation

## 2015-12-16 ENCOUNTER — Other Ambulatory Visit: Payer: Self-pay | Admitting: Internal Medicine

## 2016-01-13 ENCOUNTER — Other Ambulatory Visit: Payer: Self-pay | Admitting: Family Medicine

## 2016-01-14 ENCOUNTER — Encounter: Payer: Medicare Other | Admitting: Obstetrics & Gynecology

## 2016-01-27 ENCOUNTER — Other Ambulatory Visit: Payer: Self-pay | Admitting: Internal Medicine

## 2016-01-29 ENCOUNTER — Other Ambulatory Visit: Payer: Self-pay | Admitting: Internal Medicine

## 2016-02-08 ENCOUNTER — Emergency Department (HOSPITAL_COMMUNITY): Payer: Medicare Other

## 2016-02-08 ENCOUNTER — Observation Stay (HOSPITAL_COMMUNITY)
Admission: EM | Admit: 2016-02-08 | Discharge: 2016-02-10 | Disposition: A | Discharge status: Home / Self Care | Payer: Medicare Other | Attending: Internal Medicine | Admitting: Internal Medicine

## 2016-02-08 ENCOUNTER — Encounter (HOSPITAL_COMMUNITY): Payer: Self-pay

## 2016-02-08 DIAGNOSIS — E785 Hyperlipidemia, unspecified: Secondary | ICD-10-CM | POA: Insufficient documentation

## 2016-02-08 DIAGNOSIS — I5032 Chronic diastolic (congestive) heart failure: Secondary | ICD-10-CM | POA: Diagnosis not present

## 2016-02-08 DIAGNOSIS — I209 Angina pectoris, unspecified: Secondary | ICD-10-CM | POA: Diagnosis present

## 2016-02-08 DIAGNOSIS — K5900 Constipation, unspecified: Secondary | ICD-10-CM | POA: Diagnosis not present

## 2016-02-08 DIAGNOSIS — Z79899 Other long term (current) drug therapy: Secondary | ICD-10-CM | POA: Diagnosis not present

## 2016-02-08 DIAGNOSIS — I110 Hypertensive heart disease with heart failure: Secondary | ICD-10-CM | POA: Insufficient documentation

## 2016-02-08 DIAGNOSIS — Z794 Long term (current) use of insulin: Secondary | ICD-10-CM | POA: Diagnosis not present

## 2016-02-08 DIAGNOSIS — Z7902 Long term (current) use of antithrombotics/antiplatelets: Secondary | ICD-10-CM | POA: Diagnosis not present

## 2016-02-08 DIAGNOSIS — R079 Chest pain, unspecified: Secondary | ICD-10-CM | POA: Diagnosis not present

## 2016-02-08 DIAGNOSIS — H4311 Vitreous hemorrhage, right eye: Secondary | ICD-10-CM | POA: Insufficient documentation

## 2016-02-08 DIAGNOSIS — I252 Old myocardial infarction: Secondary | ICD-10-CM | POA: Diagnosis not present

## 2016-02-08 DIAGNOSIS — R0789 Other chest pain: Secondary | ICD-10-CM | POA: Diagnosis not present

## 2016-02-08 DIAGNOSIS — Z6841 Body Mass Index (BMI) 40.0 and over, adult: Secondary | ICD-10-CM | POA: Insufficient documentation

## 2016-02-08 DIAGNOSIS — R42 Dizziness and giddiness: Secondary | ICD-10-CM | POA: Diagnosis not present

## 2016-02-08 DIAGNOSIS — I25110 Atherosclerotic heart disease of native coronary artery with unstable angina pectoris: Principal | ICD-10-CM | POA: Insufficient documentation

## 2016-02-08 DIAGNOSIS — Z7982 Long term (current) use of aspirin: Secondary | ICD-10-CM | POA: Insufficient documentation

## 2016-02-08 DIAGNOSIS — E11319 Type 2 diabetes mellitus with unspecified diabetic retinopathy without macular edema: Secondary | ICD-10-CM | POA: Insufficient documentation

## 2016-02-08 DIAGNOSIS — I200 Unstable angina: Secondary | ICD-10-CM | POA: Insufficient documentation

## 2016-02-08 HISTORY — DX: Disorder of kidney and ureter, unspecified: N28.9

## 2016-02-08 LAB — CBC
HEMATOCRIT: 40 % (ref 36.0–46.0)
Hemoglobin: 13 g/dL (ref 12.0–15.0)
MCH: 30.3 pg (ref 26.0–34.0)
MCHC: 32.5 g/dL (ref 30.0–36.0)
MCV: 93.2 fL (ref 78.0–100.0)
PLATELETS: 280 10*3/uL (ref 150–400)
RBC: 4.29 MIL/uL (ref 3.87–5.11)
RDW: 14.6 % (ref 11.5–15.5)
WBC: 12.7 10*3/uL — AB (ref 4.0–10.5)

## 2016-02-08 LAB — BASIC METABOLIC PANEL
Anion gap: 7 (ref 5–15)
BUN: 13 mg/dL (ref 6–20)
CALCIUM: 8.9 mg/dL (ref 8.9–10.3)
CO2: 23 mmol/L (ref 22–32)
CREATININE: 1.26 mg/dL — AB (ref 0.44–1.00)
Chloride: 108 mmol/L (ref 101–111)
GFR calc Af Amer: 52 mL/min — ABNORMAL LOW (ref >60)
GFR, EST NON AFRICAN AMERICAN: 45 mL/min — AB (ref >60)
GLUCOSE: 119 mg/dL — AB (ref 65–99)
Potassium: 3.5 mmol/L (ref 3.5–5.1)
SODIUM: 138 mmol/L (ref 135–145)

## 2016-02-08 LAB — CBG MONITORING, ED: GLUCOSE-CAPILLARY: 122 mg/dL — AB (ref 65–99)

## 2016-02-08 LAB — GLUCOSE, CAPILLARY
GLUCOSE-CAPILLARY: 159 mg/dL — AB (ref 65–99)
Glucose-Capillary: 128 mg/dL — ABNORMAL HIGH (ref 65–99)

## 2016-02-08 LAB — TROPONIN I

## 2016-02-08 LAB — I-STAT TROPONIN, ED: TROPONIN I, POC: 0 ng/mL (ref 0.00–0.08)

## 2016-02-08 MED ORDER — HEPARIN SODIUM (PORCINE) 5000 UNIT/ML IJ SOLN
5000.0000 [IU] | Freq: Three times a day (TID) | INTRAMUSCULAR | Status: DC
  Administered 2016-02-08 – 2016-02-10 (×6): 5000 [IU] via SUBCUTANEOUS
  Filled 2016-02-08 (×6): qty 1

## 2016-02-08 MED ORDER — SODIUM CHLORIDE 0.9% FLUSH
3.0000 mL | Freq: Two times a day (BID) | INTRAVENOUS | Status: DC
  Administered 2016-02-08 – 2016-02-10 (×5): 3 mL via INTRAVENOUS

## 2016-02-08 MED ORDER — TIMOLOL MALEATE 0.5 % OP SOLN
1.0000 [drp] | Freq: Two times a day (BID) | OPHTHALMIC | Status: DC
  Administered 2016-02-09 – 2016-02-10 (×3): 1 [drp] via OPHTHALMIC
  Filled 2016-02-08: qty 5

## 2016-02-08 MED ORDER — RANOLAZINE ER 500 MG PO TB12
500.0000 mg | ORAL_TABLET | Freq: Two times a day (BID) | ORAL | Status: DC
  Administered 2016-02-08 – 2016-02-10 (×3): 500 mg via ORAL
  Filled 2016-02-08 (×6): qty 1

## 2016-02-08 MED ORDER — ACETAZOLAMIDE ER 500 MG PO CP12
500.0000 mg | ORAL_CAPSULE | Freq: Two times a day (BID) | ORAL | Status: DC
  Administered 2016-02-08 – 2016-02-10 (×4): 500 mg via ORAL
  Filled 2016-02-08 (×4): qty 1

## 2016-02-08 MED ORDER — ONDANSETRON HCL 4 MG/2ML IJ SOLN
4.0000 mg | Freq: Four times a day (QID) | INTRAMUSCULAR | Status: AC | PRN
  Administered 2016-02-08: 4 mg via INTRAVENOUS
  Filled 2016-02-08: qty 2

## 2016-02-08 MED ORDER — POLYVINYL ALCOHOL 1.4 % OP SOLN: 1.0000 [drp] | Freq: Every day | OPHTHALMIC | Status: DC | PRN

## 2016-02-08 MED ORDER — BRIMONIDINE TARTRATE 0.2 % OP SOLN
1.0000 [drp] | Freq: Two times a day (BID) | OPHTHALMIC | Status: DC
  Administered 2016-02-09 – 2016-02-10 (×3): 1 [drp] via OPHTHALMIC
  Filled 2016-02-08: qty 5

## 2016-02-08 MED ORDER — ASPIRIN EC 81 MG PO TBEC
81.0000 mg | DELAYED_RELEASE_TABLET | Freq: Every day | ORAL | Status: DC
  Administered 2016-02-09 – 2016-02-10 (×2): 81 mg via ORAL
  Filled 2016-02-08 (×2): qty 1

## 2016-02-08 MED ORDER — CLOPIDOGREL BISULFATE 75 MG PO TABS
75.0000 mg | ORAL_TABLET | Freq: Every day | ORAL | Status: DC
  Administered 2016-02-09 – 2016-02-10 (×2): 75 mg via ORAL
  Filled 2016-02-08 (×2): qty 1

## 2016-02-08 MED ORDER — PANTOPRAZOLE SODIUM 40 MG PO TBEC
40.0000 mg | DELAYED_RELEASE_TABLET | Freq: Every day | ORAL | Status: DC
  Administered 2016-02-09 – 2016-02-10 (×2): 40 mg via ORAL
  Filled 2016-02-08 (×2): qty 1

## 2016-02-08 MED ORDER — FUROSEMIDE 80 MG PO TABS
80.0000 mg | ORAL_TABLET | Freq: Every day | ORAL | Status: DC
  Administered 2016-02-09 – 2016-02-10 (×2): 80 mg via ORAL
  Filled 2016-02-08 (×2): qty 1

## 2016-02-08 MED ORDER — DILTIAZEM HCL ER COATED BEADS 240 MG PO CP24
240.0000 mg | ORAL_CAPSULE | Freq: Every day | ORAL | Status: DC
  Administered 2016-02-09 – 2016-02-10 (×2): 240 mg via ORAL
  Filled 2016-02-08 (×3): qty 1

## 2016-02-08 MED ORDER — INSULIN ASPART 100 UNIT/ML ~~LOC~~ SOLN
0.0000 [IU] | Freq: Three times a day (TID) | SUBCUTANEOUS | Status: DC
  Administered 2016-02-08: 4 [IU] via SUBCUTANEOUS
  Administered 2016-02-10: 7 [IU] via SUBCUTANEOUS

## 2016-02-08 MED ORDER — BACLOFEN 10 MG PO TABS: 10.0000 mg | ORAL_TABLET | Freq: Every evening | ORAL | Status: DC | PRN

## 2016-02-08 MED ORDER — FERROUS SULFATE 325 (65 FE) MG PO TABS
325.0000 mg | ORAL_TABLET | Freq: Every day | ORAL | Status: DC
  Administered 2016-02-09 – 2016-02-10 (×2): 325 mg via ORAL
  Filled 2016-02-08 (×2): qty 1

## 2016-02-08 MED ORDER — LATANOPROST 0.005 % OP SOLN
1.0000 [drp] | Freq: Every day | OPHTHALMIC | Status: DC
  Administered 2016-02-09: 1 [drp] via OPHTHALMIC
  Filled 2016-02-08: qty 2.5

## 2016-02-08 MED ORDER — INSULIN ASPART PROT & ASPART (70-30 MIX) 100 UNIT/ML ~~LOC~~ SUSP
50.0000 [IU] | Freq: Two times a day (BID) | SUBCUTANEOUS | Status: DC
  Administered 2016-02-08 – 2016-02-10 (×3): 50 [IU] via SUBCUTANEOUS
  Filled 2016-02-08: qty 10

## 2016-02-08 MED ORDER — NITROGLYCERIN 0.4 MG SL SUBL
0.4000 mg | SUBLINGUAL_TABLET | SUBLINGUAL | Status: DC | PRN
  Administered 2016-02-09 (×2): 0.4 mg via SUBLINGUAL

## 2016-02-08 MED ORDER — CALCIUM CARBONATE ANTACID 500 MG PO CHEW: 1.0000 | CHEWABLE_TABLET | Freq: Two times a day (BID) | ORAL | Status: DC | PRN

## 2016-02-08 MED ORDER — POLYETHYLENE GLYCOL 3350 17 G PO PACK: 17.0000 g | PACK | Freq: Every day | ORAL | Status: DC | PRN

## 2016-02-08 MED ORDER — ONDANSETRON HCL 4 MG/2ML IJ SOLN
4.0000 mg | Freq: Once | INTRAMUSCULAR | Status: AC | PRN
  Administered 2016-02-08: 4 mg via INTRAVENOUS
  Filled 2016-02-08: qty 2

## 2016-02-08 MED ORDER — ATORVASTATIN CALCIUM 80 MG PO TABS
80.0000 mg | ORAL_TABLET | Freq: Every day | ORAL | Status: DC
  Administered 2016-02-08 – 2016-02-09 (×2): 80 mg via ORAL
  Filled 2016-02-08 (×2): qty 1

## 2016-02-08 MED ORDER — INSULIN ASPART 100 UNIT/ML ~~LOC~~ SOLN: 0.0000 [IU] | Freq: Every day | SUBCUTANEOUS | Status: DC

## 2016-02-08 MED ORDER — ISOSORBIDE MONONITRATE ER 60 MG PO TB24: 60.0000 mg | ORAL_TABLET | Freq: Every day | ORAL | Status: DC

## 2016-02-08 MED ORDER — ACETAMINOPHEN 500 MG PO TABS: 500.0000 mg | ORAL_TABLET | Freq: Four times a day (QID) | ORAL | Status: DC | PRN

## 2016-02-08 MED ORDER — METOPROLOL SUCCINATE ER 100 MG PO TB24
200.0000 mg | ORAL_TABLET | Freq: Every day | ORAL | Status: DC
  Administered 2016-02-09 – 2016-02-10 (×2): 200 mg via ORAL
  Filled 2016-02-08 (×2): qty 2

## 2016-02-08 MED ORDER — BRIMONIDINE TARTRATE-TIMOLOL 0.2-0.5 % OP SOLN: 1.0000 [drp] | Freq: Two times a day (BID) | OPHTHALMIC | Status: DC

## 2016-02-08 NOTE — ED Notes (Signed)
Patient complains of chest pressure that awoke her from sleep at 0300. She then developed nausea and vomiting with same. Complains of headache with same

## 2016-02-08 NOTE — Consult Note (Signed)
No chief complaint on file. :   ED HPI: HPI     Ophthalmology HPI: This is a 60 y.o.  female with a past ocular history listed below that presents with fbs right eye. This has been going on for years. She is NLP from diabeticretinopathy /glaucoma  OD. She has no changes in symptoms over the past 48 hours. Symptosm of constant fbs and irritation unchanged.      Past Ocular History:  Diabetic Retinopathy OU Retina Surgery OU -[place] Pseudophakia OU Glaucoma OU  Refractive error Corneal Scar OD NLP OD.    Last Eye Exam:  4 months ago, Dr. Venetia Maxon   Past Medical History  Diagnosis Date  . Hypertension   . Hyperlipemia   . Coronary artery disease     Medical Rx  . GERD (gastroesophageal reflux disease)   . Morbid obesity with BMI of 40.0-44.9, adult ([place])   . Anemia   . Diabetic retinopathy ([place])   . Chronic diastolic heart failure ([place]) 9/13    echo 03/20/15 LV Ef of 60-65%, grade 2DD and PA peak pressure: 36 mm Hg   . Blind right eye   . Type II diabetes mellitus ([place])   . Myocardial infarction ([place]) "several"  . Obstructive sleep apnea     "they say I do; I say I don't; no mask" (03/18/2015)  . History of blood transfusion "several"    "related to menopause"  . Chronic back pain     "my whole back" (03/18/2015)     Past Surgical History  Procedure Laterality Date  . Incision and drainage perirectal abscess  04/03/2012  . Dilation and curettage of uterus    . Cardiac catheterization  July 2010  . Cataract extraction, bilateral Bilateral   . Eye surgery Bilateral "several"     Social History   Social History  . Marital Status: Married    Spouse Name: N/A  . Number of Children: 0  . Years of Education: N/A   Occupational History  . Not on file.   Social History Main Topics  . Smoking status: Never Smoker   . Smokeless tobacco: Never Used  . Alcohol Use: No  . Drug Use: No  . Sexual Activity: Yes   Other Topics Concern  . Not on file    Social History Narrative     Allergies  Allergen Reactions  . Lorazepam Anaphylaxis  . Orange Fruit [Citrus] Anaphylaxis    Pt stated throat swelling, itching     No current facility-administered medications on file prior to encounter.   Current Outpatient Prescriptions on File Prior to Encounter  Medication Sig Dispense Refill  . ACCU-CHEK FASTCLIX LANCETS MISC 1 each by Other route 3 (three) times daily. 102 each 11  . acetaminophen (TYLENOL) 500 MG tablet Take 500 mg by mouth every 6 (six) hours as needed. For pain    . acetaZOLAMIDE (DIAMOX) 500 MG capsule Take 500 mg by mouth 2 (two) times daily.    Marland Kitchen aspirin 81 MG tablet Take 81 mg by mouth daily.    Marland Kitchen atorvastatin (LIPITOR) 80 MG tablet Take 1 tablet (80 mg total) by mouth daily at 6 PM. 90 tablet 3  . baclofen (LIORESAL) 10 MG tablet Take 1 tablet (10 mg total) by mouth at bedtime as needed for muscle spasms. 30 each 2  . bismuth subsalicylate (PEPTO BISMOL) 262 MG/15ML suspension Take 30 mLs by mouth every 6 (six) hours as needed for indigestion.     Marland Kitchen  Blood Glucose Monitoring Suppl (ACCU-CHEK AVIVA PLUS) w/Device KIT Used as directed 1 kit 0  . brimonidine-timolol (COMBIGAN) 0.2-0.5 % ophthalmic solution Place 1 drop into both eyes every 12 (twelve) hours.    . calcium carbonate (TUMS - DOSED IN MG ELEMENTAL CALCIUM) 500 MG chewable tablet Chew 1 tablet by mouth 2 (two) times daily as needed for indigestion.     . clopidogrel (PLAVIX) 75 MG tablet Take 1 tablet (75 mg total) by mouth daily. 90 tablet 3  . diltiazem (DILACOR XR) 240 MG 24 hr capsule TAKE ONE CAPSULE BY MOUTH ONCE DAILY 90 capsule 0  . ferrous sulfate 325 (65 FE) MG tablet Take 325 mg by mouth daily with breakfast.    . furosemide (LASIX) 80 MG tablet Take 1 tablet (80 mg total) by mouth daily. 90 tablet 3  . glucose blood (FREESTYLE TEST STRIPS) test strip Use as instructed 100 each 12  . glucose blood test strip Use TID before meals / Dx E11.9 100 each 12   . glucose monitoring kit (FREESTYLE) monitoring kit 1 each by Does not apply route 4 (four) times daily - after meals and at bedtime. 1 each 1  . hydrocortisone cream 1 % Apply 1 application topically daily as needed for itching.    . insulin lispro protamine-lispro (HUMALOG MIX 75/25) (75-25) 100 UNIT/ML SUSP injection Inject 65 Units into the skin 2 (two) times daily with a meal. 40 mL 5  . Insulin Syringe-Needle U-100 (RELION INSULIN SYR 1CC/30G) 30G X 5/16" 1 ML MISC 1 each by Other route 2 (two) times daily. USE AS DIRECTED 200 each 3  . isosorbide mononitrate (IMDUR) 60 MG 24 hr tablet Take 1 tablet (60 mg total) by mouth daily. 90 tablet 3  . LUMIGAN 0.01 % SOLN Place 1 drop into both eyes 2 (two) times daily.    . Menthol-Methyl Salicylate (MUSCLE RUB EX) Apply 1 application topically daily as needed (pain).    . metoprolol (TOPROL-XL) 200 MG 24 hr tablet Take 1 tablet (200 mg total) by mouth daily. 90 tablet 3  . nitroGLYCERIN (NITROSTAT) 0.4 MG SL tablet DISSOLVE ONE TABLET UNDER THE TONGUE EVERY 5 MINUTES AS NEEDED FOR CHEST PAIN.  DO NOT EXCEED A TOTAL OF 3 DOSES IN 15 MINUTES 25 tablet 5  . pantoprazole (PROTONIX) 40 MG tablet TAKE ONE TABLET BY MOUTH ONCE DAILY 30 tablet 2  . polyethylene glycol (MIRALAX / GLYCOLAX) packet Take 17 g by mouth daily as needed for mild constipation.    . polyvinyl alcohol (LIQUIFILM TEARS) 1.4 % ophthalmic solution Place 1 drop into both eyes daily as needed. For dry eyes    . ranolazine (RANEXA) 500 MG 12 hr tablet Take 1 tablet (500 mg total) by mouth 2 (two) times daily. 180 tablet 3  . RELION INSULIN SYR 1CC/30G 30G X 5/16" 1 ML MISC USE AS DIRECTED 100 each 2     ROS    Exam:  General: Awake, Alert, Oriented *3  Vision (near): with correction   OD: NLP  OS: 6 point  Confrontational Field:   Full to count fingers, OS  Extraocular Motility:  Full ductions and versions, both eyes   External:   Normal Symmetry, V1-3 intact,  infraorbital nerve appears intact.    Pupils  OD: 7mm unreactive 4 + afferent pupillary defect (APD)  OS: 90mm to 42mm reactive without afferent pupillary defect (APD)   IOP: 23/22  Slit Lamp Exam:  Lids/Lashes  OD: Normal Lids and  lashes, No lesion or injury  OS: Normal lids and lashes, nor lesion or injury  Conjucntiva/Sclera  OD: Superior pigment, Possible failed trab?   OS: White and quiet  Cornea  OD: Corneal scar  OS: Clear without abrasion or defect  Anterior Chamber  OD: Deep and quiet  OS: Deep and quiet  Iris  OD: Normal iris architecture, No obvious rubeosis   OS: Normal Iris Architecture   Lens  OD: PCIOL   OS: PCIOL  Anterior Vitreous  OD: Hemorrhage  OS: Clear without cell   POSTERIOR POLE EXAM (Dialated with phenylephrine and tropicamide.Dilation may last up to 24 hours)  View:   OD: No view, Vit hemorrhage  OS: 20/20 view without opacities  Vitreous:   OD: Vit Hemorrhage  OS: Clear, no cell  Disc:    OD: No veiw  OS: flat, sharp margin, with appropriate color  C:D Ratio:   OD: No View  OS: 0.75  Macula  OD: No View  OS: Flat with appropriate light reflex  Vessels  OD: No view  OS: Normal vasculature  Periphery  OD: No View  OS: Diabetic retiniopathy.      Assessment and Plan:   This is 60 y.o.  female with h/o of multiple eye surgeries, diabetic retinopathy, glaucoma, and blind right eye presents with DRY EYES OD>OS.   Diabetic Retinopathy - Recommend bg control, bp control, and smoking cessation - Vitreous hemorrhage OD secondary to diabetic retinopathy. No intervention recommended in setting of No Light Perception eye.   Glaucoma - Previously well controled on Combigan BID and Lumigan QHS - Continue Home Drops  NLP Eye OD - Discussed options to treat blind painful eye including enucleation vs. Retrobulbar EtOH. Patient is not interested in any surgical intervention at this time. Irritation and discomfort not  signficant.   Dry Eyes - Recommend lubrication with artificial tears QID.    Follow up as outpatient within one week with primary ophthalmologist.    Julian Reil, M.D.  [facility] [address] [place], [place] [address] [phone] ([phone] .

## 2016-02-08 NOTE — ED Notes (Signed)
Placed order for pt. Lunch tray

## 2016-02-08 NOTE — ED Provider Notes (Signed)
CSN: [id number]     Arrival date & time 02/08/16  0737 History   First MD Initiated Contact with Patient 02/08/16 [phone]     No chief complaint on file.    (Consider location/radiation/quality/duration/timing/severity/associated sxs/prior Treatment) HPI Comments: Pt is a 60 y.o. female with long-standing severe diabetes mellitus, HTN, HL with extensive multivessel coronary artery disease with diffuse involvement that is being medically managed, comes in with cc of chest pain. Pt reports that chest pain started around 3 am. Pain is constant, L sided, pressure like pain which radiated to the L arm. She has associated nausea, dizziness. Pt denies any sweats. Pt denies any exertional chest pain leading upto yday. PT took some nitro prior to the ER arrival, and her chest pain has now resolved. Pt also reports dizziness that is constant for the oast several weeks. She has been told that her dizziness is due to meds. She reports spinning and lightheadedness as her dizziness, which is constant - even when she is laying. No associated focal numbness, tingling. Pt denies hx of strokes. No new meds,.   ROS 10 Systems reviewed and are negative for acute change except as noted in the HPI.     The history is provided by the patient.    Past Medical History  Diagnosis Date  . Hypertension   . Hyperlipemia   . Coronary artery disease     Medical Rx  . GERD (gastroesophageal reflux disease)   . Morbid obesity with BMI of 40.0-44.9, adult ([place])   . Anemia   . Diabetic retinopathy ([place])   . Chronic diastolic heart failure ([place]) 9/13    echo 03/20/15 LV Ef of 60-65%, grade 2DD and PA peak pressure: 36 mm Hg   . Blind right eye   . Type II diabetes mellitus ([place])   . Myocardial infarction ([place]) "several"  . Obstructive sleep apnea     "they say I do; I say I don't; no mask" (03/18/2015)  . History of blood transfusion "several"    "related to menopause"  . Chronic back pain     "my whole back"  (03/18/2015)   Past Surgical History  Procedure Laterality Date  . Incision and drainage perirectal abscess  04/03/2012  . Dilation and curettage of uterus    . Cardiac catheterization  July 2010  . Cataract extraction, bilateral Bilateral   . Eye surgery Bilateral "several"   Family History  Problem Relation Age of Onset  . Colon polyps Father   . Diabetes Mother   . Diabetes Father   . Diabetes Brother   . Diabetes Sister   . Heart disease Father   . Heart disease Brother   . Heart disease Sister   . Heart disease Mother   . Heart attack Mother   . Heart attack Father   . Diabetes Maternal Grandmother   . Stroke Father   . Hypertension Brother   . Hypertension Sister   . Hypertension Father   . Hypertension Mother   . Hypertension Maternal Grandmother    Social History  Substance Use Topics  . Smoking status: Never Smoker   . Smokeless tobacco: Never Used  . Alcohol Use: No   OB History    No data available     Review of Systems    Allergies  Lorazepam and Orange fruit  Home Medications   Prior to Admission medications   Medication Sig Start Date End Date Taking? Authorizing Provider  ACCU-CHEK FASTCLIX LANCETS MISC 1 each  by Other route 3 (three) times daily. 09/17/15   Boykin Nearing, MD  acetaminophen (TYLENOL) 500 MG tablet Take 500 mg by mouth every 6 (six) hours as needed. For pain    Historical Provider, MD  acetaZOLAMIDE (DIAMOX) 500 MG capsule Take 500 mg by mouth 2 (two) times daily.    Historical Provider, MD  aspirin 81 MG tablet Take 81 mg by mouth daily.    Historical Provider, MD  atorvastatin (LIPITOR) 80 MG tablet Take 1 tablet (80 mg total) by mouth daily at 6 PM. 03/22/15   Almyra Deforest, PA  baclofen (LIORESAL) 10 MG tablet Take 1 tablet (10 mg total) by mouth at bedtime as needed for muscle spasms. 01/13/15   Lorayne Marek, MD  bismuth subsalicylate (PEPTO BISMOL) 262 MG/15ML suspension Take 30 mLs by mouth every 6 (six) hours as needed.     Historical Provider, MD  Blood Glucose Monitoring Suppl (ACCU-CHEK AVIVA PLUS) w/Device KIT Used as directed 09/17/15   Josalyn Funches, MD  brimonidine-timolol (COMBIGAN) 0.2-0.5 % ophthalmic solution Place 1 drop into both eyes every 12 (twelve) hours.    Historical Provider, MD  calcium carbonate (TUMS - DOSED IN MG ELEMENTAL CALCIUM) 500 MG chewable tablet Chew 1 tablet by mouth 2 (two) times daily.    Historical Provider, MD  clopidogrel (PLAVIX) 75 MG tablet Take 1 tablet (75 mg total) by mouth daily. 03/22/15   Almyra Deforest, PA  diltiazem (DILACOR XR) 240 MG 24 hr capsule TAKE ONE CAPSULE BY MOUTH ONCE DAILY 12/01/15   Mihai Croitoru, MD  ferrous sulfate 325 (65 FE) MG tablet Take 325 mg by mouth daily with breakfast.    Historical Provider, MD  furosemide (LASIX) 80 MG tablet Take 1 tablet (80 mg total) by mouth daily. 08/25/15   Mihai Croitoru, MD  glucose blood (FREESTYLE TEST STRIPS) test strip Use as instructed 09/12/15   Josalyn Funches, MD  glucose blood test strip Use TID before meals / Dx E11.9 09/17/15   Boykin Nearing, MD  glucose monitoring kit (FREESTYLE) monitoring kit 1 each by Does not apply route 4 (four) times daily - after meals and at bedtime. 09/12/15   Josalyn Funches, MD  hydrocortisone cream 1 % Apply 1 application topically daily as needed for itching.    Historical Provider, MD  insulin lispro protamine-lispro (HUMALOG MIX 75/25) (75-25) 100 UNIT/ML SUSP injection Inject 65 Units into the skin 2 (two) times daily with a meal. 09/12/15   Josalyn Funches, MD  Insulin Syringe-Needle U-100 (RELION INSULIN SYR 1CC/30G) 30G X 5/16" 1 ML MISC 1 each by Other route 2 (two) times daily. USE AS DIRECTED 09/12/15   Boykin Nearing, MD  isosorbide mononitrate (IMDUR) 60 MG 24 hr tablet Take 1 tablet (60 mg total) by mouth daily. 03/22/15   Almyra Deforest, PA  LUMIGAN 0.01 % SOLN Place 1 drop into both eyes 2 (two) times daily. 03/06/14   Historical Provider, MD  Menthol-Methyl Salicylate (MUSCLE RUB  EX) Apply 1 application topically daily as needed (pain).    Historical Provider, MD  metoprolol (TOPROL-XL) 200 MG 24 hr tablet Take 1 tablet (200 mg total) by mouth daily. 08/25/15   Mihai Croitoru, MD  nitroGLYCERIN (NITROSTAT) 0.4 MG SL tablet DISSOLVE ONE TABLET UNDER THE TONGUE EVERY 5 MINUTES AS NEEDED FOR CHEST PAIN.  DO NOT EXCEED A TOTAL OF 3 DOSES IN 15 MINUTES 08/25/15   Mihai Croitoru, MD  pantoprazole (PROTONIX) 40 MG tablet TAKE ONE TABLET BY MOUTH ONCE DAILY 01/13/16  Boykin Nearing, MD  polyethylene glycol (MIRALAX / GLYCOLAX) packet Take 17 g by mouth daily as needed for mild constipation.    Historical Provider, MD  polyvinyl alcohol (LIQUIFILM TEARS) 1.4 % ophthalmic solution Place 1 drop into both eyes daily as needed. For dry eyes    Historical Provider, MD  ranolazine (RANEXA) 500 MG 12 hr tablet Take 1 tablet (500 mg total) by mouth 2 (two) times daily. 08/25/15   Mihai Croitoru, MD  RELION INSULIN SYR 1CC/30G 30G X 5/16" 1 ML MISC USE AS DIRECTED 01/29/16   Josalyn Funches, MD   BP 155/75 mmHg  Pulse 65  Temp(Src) 98.6 F (37 C) (Oral)  Resp 11  Ht '5\' 3"'  (1.6 m)  Wt 272 lb 7.8 oz (123.6 kg)  BMI 48.28 kg/m2  SpO2 99%  LMP 04/02/2012 Physical Exam  Constitutional: She is oriented to person, place, and time. She appears well-developed.  HENT:  Head: Normocephalic and atraumatic.  Eyes: EOM are normal.  Neck: Normal range of motion. Neck supple.  Cardiovascular: Normal rate, regular rhythm and normal heart sounds.   Pulmonary/Chest: Effort normal and breath sounds normal. No respiratory distress.  Abdominal: Soft. Bowel sounds are normal. She exhibits no distension. There is no tenderness. There is no rebound and no guarding.  Musculoskeletal: She exhibits no edema.  Neurological: She is alert and oriented to person, place, and time. No cranial nerve deficit. Coordination normal.  Cerebellar exam is normal (finger to nose) Sensory exam normal for bilateral upper and  lower extremities - and patient is able to discriminate between sharp and dull. Motor exam is 4+/5   Skin: Skin is warm and dry.  Nursing note and vitals reviewed.   ED Course  Procedures (including critical care time) Labs Review Labs Reviewed  BASIC METABOLIC PANEL - Abnormal; Notable for the following:    Glucose, Bld 119 (*)    Creatinine, Ser 1.26 (*)    GFR calc non Af Amer 45 (*)    GFR calc Af Amer 52 (*)    All other components within normal limits  CBC - Abnormal; Notable for the following:    WBC 12.7 (*)    All other components within normal limits  I-STAT TROPOININ, ED    Imaging Review Dg Chest 2 View  02/08/2016  CLINICAL DATA:  Chest pain EXAM: CHEST  2 VIEW COMPARISON:  03/18/2015 chest radiograph. FINDINGS: Stable cardiomediastinal silhouette with mild cardiomegaly. No pneumothorax. No pleural effusion. Lungs appear clear, with no acute consolidative airspace disease and no pulmonary edema. IMPRESSION: Stable mild cardiomegaly without pulmonary edema. No active pulmonary disease. Electronically Signed   By: Ilona Sorrel M.D.   On: 02/08/2016 08:24   I have personally reviewed and evaluated these images and lab results as part of my medical decision-making.   EKG Interpretation   Date/Time:  Sunday February 08 2016 07:44:33 EDT Ventricular Rate:  63 PR Interval:  150 QRS Duration: 104 QT Interval:  438 QTC Calculation: 448 R Axis:   11 Text Interpretation:  Normal sinus rhythm RSR' or QR pattern in V1  suggests right ventricular conduction delay Borderline ECG No acute  changes No significant change since last tracing Confirmed by Kathrynn Humble,  MD, Thelma Comp [phone]) on 02/08/2016 8:07:15 AM Also confirmed by Kathrynn Humble, MD,  Thelma Comp [phone]), editor [place] CT, Leda Gauze [phone])  on 02/08/2016 8:26:16 AM      MDM  Differential diagnosis includes: ACS syndrome CHF exacerbation Valvular disorder PE Musculoskeletal pain Stroke Orthostasis Medication side  effects leading  to dizziness.  Pt comes in with cc of dizziness and chest pain. Chest pain is L sided and has some typical features. She has extensive medical hx and known CAD that is being medically managed. Currently chest pain free. Will need to come if for ACS r/o.  Also has dizziness, with a non focal neuro exam. Given the dizziness is constant, and even present at rest, we will get CT head, if negative, we will defer further care to admitting team, and presume that the dizziness is related to her meds or due to peripheral neuropathy.   Final diagnoses:  Acute chest pain  Dizziness      Varney Biles, MD 02/08/16 [phone]

## 2016-02-08 NOTE — H&P (Signed)
Date: 02/08/2016               Patient Name:  [name] MRN: [id number]  DOB: 16-Dec-1955 Age / Sex: 60 y.o., female   PCP: Boykin Nearing, MD         Medical Service: Internal Medicine Teaching Service         Attending Physician: Dr. Oval Linsey, MD    First Contact: Dr. Lorella Nimrod Pager: [phone]  Second Contact: Dr. Albin Felling Pager: [phone]       After Hours (After 5p/  First Contact Pager: [phone]  weekends / holidays): Second Contact Pager: [phone]   Chief Complaint: Pain behind the right eye since 3:00 am                              Chest Pain  Since this early morning  History of Present Illness: [name] 60 YO lady with PMH of DM, HTN, HL with extensive multivessel coronary artery disease ( medically managed) came with C/O her Rt.eye pain started suddenly around 3:00am which woke her up about 10/10 in intensity, non radiating , she got somewhat relieved with 1000 mg of tylenol( 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 of her DM)  followed by left sided pressure like chest pain radiating to left arm, 9/10 , accompanied with SOB, palpitation and N/V. She has an H/O PND and sleeps on 5 pillows.Chest pain relieved after 3 Nitroglycerine SL. Also C/O occasional dizziness for a while which she describe as room spinning and associated with sudden change in posture.Denies any recent change in vision, headaches, any localized weakness. Also denies orthopnea,fever, chest congestion.No change in appetite, wt. or bowel habits.  Meds: Current Facility-Administered Medications  Medication Dose Route Frequency Provider Last Rate Last Dose  . acetaminophen (TYLENOL) tablet 500 mg  500 mg Oral Q6H PRN Juliet Rude, MD        Allergies: Allergies as of 02/08/2016 - Review Complete 02/08/2016  Allergen Reaction Noted  . Lorazepam Anaphylaxis 01/15/2012  . Orange fruit [citrus] Anaphylaxis 04/10/2012   Past Medical History  Diagnosis Date  . Hypertension     . Hyperlipemia   . Coronary artery disease     Medical Rx  . GERD (gastroesophageal reflux disease)   . Morbid obesity with BMI of 40.0-44.9, adult ([place])   . Anemia   . Diabetic retinopathy ([place])   . Chronic diastolic heart failure ([place]) 9/13    echo 03/20/15 LV Ef of 60-65%, grade 2DD and PA peak pressure: 36 mm Hg   . Blind right eye   . Type II diabetes mellitus ([place])   . Myocardial infarction ([place]) "several"  . Obstructive sleep apnea     "they say I do; I say I don't; no mask" (03/18/2015)  . History of blood transfusion "several"    "related to menopause"  . Chronic back pain     "my whole back" (03/18/2015)    Family History: Father died of heart disease in his 10 es. Mother Died of Breast Cancer in her 58 es. Brother has heart disease One sister is Diabetic Other sister has HTN.  Social History: Non smoker No Alcohol use No illicit drugs  Review of Systems: A complete ROS was negative except as per HPI.   Physical Exam: Blood pressure 136/77, pulse 72, temperature 98.6 F (37 C), temperature source Oral, resp. rate 15, height  5\' 3"  (1.6 m), weight 272 lb 7.8 oz (123.6 kg), last menstrual period 04/02/2012, SpO2 99 %.  General: Obese lady lying comfortably in bed. Alert and oriented. EYES: Rt. Pupil distorted and opaque,with no light reflex. Conjunctival erythema.             Lt. Some opacities , photophobia, reacting to light HEAD: Normocephalic and atraumatic Neck: Supple,  Chest: No tenderness,Clear,No added sounds. CVS: RRR, No r/m/g Abd: Obese,soft, non tender. No orgamomegaly. BS +ve [id number] and oriented X 3  CN intact. Motor strength 5/5 bilaterally and sensations grossly intact. Extremities: Mild bilateral edema till below knees, peripheral pulses present and symmetrical bilaterally.  EKG:  Date/Time: Sunday February 08 2016 07:44:33 EDT Ventricular Rate: 63 PR Interval: 150 QRS Duration: 104 QT Interval: 438 QTC Calculation: 448 R Axis:  11 Normal sinus rhythm RSR' or QR pattern in V1 suggests right ventricular conduction delay Borderline ECG No acute changes No significant change since last tracing  CXR: IMPRESSION: Stable mild cardiomegaly without pulmonary edema. No active pulmonary disease  CT Head WO contrast: FINDINGS: Brain: There is age related volume loss. There is no intracranial mass, hemorrhage, extra-axial fluid collection, or midline shift. There is mild small vessel disease in the centra semiovale bilaterally. Elsewhere, gray-white compartments appear normal. No acute infarct is evident.  Vascular: There are foci of calcification in the cavernous carotid artery regions bilaterally. No hyperdense vessels noted.  Skull: The bony calvarium appears intact.  Sinuses/Orbits: There is increased attenuation material throughout the right globe, possibly due to recent hemorrhage. The left globe appears normal. Orbits otherwise appear unremarkable and symmetric bilaterally. Visualized paranasal sinuses are clear.  Other: Visualized mastoid air cells are clear.  IMPRESSION: Mild periventricular small vessel disease. No intracranial mass, hemorrhage, or extra-axial fluid collection. Foci of arterial vascular calcification bilaterally. Diffuse increased attenuation material throughout the right globe, question age uncertain hemorrhage. Acute hemorrhage in the right globe cannot be excluded by CT. Orbits otherwise appear symmetric and unremarkable Bilaterally.  LABS: first Trop. <0.03             CBG 122 BMET: Creatinine 1.26( base line 1.18) CBC: WBC 12.7  D/D:  ACS: Was on top of our list due to her PMH of extensive coronary artery disease and characteristics of her pain. Although currently her EKG and first set of trop. Is negative. We will repeat EKG in the morning and keep trending trop.   CHF Exacerbation: Although there is no current increase in her edema and PND. We will do  ECHO tomorrow  to rule out any exacerbation.  PE: Unlikely with no SOB at rest or tachypnoia and saturating 98-99% on room air.  Musculoskeletal : No change with breathing or change in position.   Assessment & Plan by Problem: EYE Pain: May be due to a recent bleed behind the eye, as that eye is blind since 5 yrs. Not an emergency . Ophthalmology is consulted and they will see her tomorrow.  Dizziness; As she reports being dizzy over few weeks, CT head was done to rule out any posterior infarcts and was normal. May be due to her meds. S/E.  DM: Continue home meds. And sliding scale.  HTN: Currently normotensive. Continue with home meds.  Diet: Heart healthy and Diabetic  DVT : Heparin    Dispo: Admit patient to med surge for less then 2 days.  Signed: Lorella Nimrod, MD 02/08/2016, 2:22 PM  Pager: [phone]

## 2016-02-09 ENCOUNTER — Encounter (HOSPITAL_COMMUNITY): Payer: Self-pay | Admitting: General Practice

## 2016-02-09 DIAGNOSIS — I5032 Chronic diastolic (congestive) heart failure: Secondary | ICD-10-CM | POA: Diagnosis not present

## 2016-02-09 DIAGNOSIS — H5441 Blindness, right eye, normal vision left eye: Secondary | ICD-10-CM | POA: Diagnosis not present

## 2016-02-09 DIAGNOSIS — R42 Dizziness and giddiness: Secondary | ICD-10-CM | POA: Diagnosis not present

## 2016-02-09 DIAGNOSIS — E11319 Type 2 diabetes mellitus with unspecified diabetic retinopathy without macular edema: Secondary | ICD-10-CM | POA: Diagnosis not present

## 2016-02-09 DIAGNOSIS — Z794 Long term (current) use of insulin: Secondary | ICD-10-CM

## 2016-02-09 DIAGNOSIS — I10 Essential (primary) hypertension: Secondary | ICD-10-CM

## 2016-02-09 DIAGNOSIS — H5711 Ocular pain, right eye: Secondary | ICD-10-CM

## 2016-02-09 DIAGNOSIS — I209 Angina pectoris, unspecified: Secondary | ICD-10-CM | POA: Diagnosis not present

## 2016-02-09 DIAGNOSIS — I110 Hypertensive heart disease with heart failure: Secondary | ICD-10-CM | POA: Diagnosis not present

## 2016-02-09 DIAGNOSIS — I25110 Atherosclerotic heart disease of native coronary artery with unstable angina pectoris: Secondary | ICD-10-CM | POA: Diagnosis not present

## 2016-02-09 DIAGNOSIS — E119 Type 2 diabetes mellitus without complications: Secondary | ICD-10-CM | POA: Diagnosis not present

## 2016-02-09 DIAGNOSIS — H4311 Vitreous hemorrhage, right eye: Secondary | ICD-10-CM | POA: Diagnosis not present

## 2016-02-09 LAB — BASIC METABOLIC PANEL
ANION GAP: 4 — AB (ref 5–15)
BUN: 11 mg/dL (ref 6–20)
CALCIUM: 8.6 mg/dL — AB (ref 8.9–10.3)
CO2: 22 mmol/L (ref 22–32)
Chloride: 112 mmol/L — ABNORMAL HIGH (ref 101–111)
Creatinine, Ser: 1.17 mg/dL — ABNORMAL HIGH (ref 0.44–1.00)
GFR calc Af Amer: 57 mL/min — ABNORMAL LOW (ref >60)
GFR, EST NON AFRICAN AMERICAN: 49 mL/min — AB (ref >60)
GLUCOSE: 106 mg/dL — AB (ref 65–99)
Potassium: 3.5 mmol/L (ref 3.5–5.1)
Sodium: 138 mmol/L (ref 135–145)

## 2016-02-09 LAB — HEMOGLOBIN A1C
HEMOGLOBIN A1C: 6.1 % — AB (ref 4.8–5.6)
MEAN PLASMA GLUCOSE: 128 mg/dL

## 2016-02-09 LAB — GLUCOSE, CAPILLARY
GLUCOSE-CAPILLARY: 191 mg/dL — AB (ref 65–99)
Glucose-Capillary: 131 mg/dL — ABNORMAL HIGH (ref 65–99)
Glucose-Capillary: 235 mg/dL — ABNORMAL HIGH (ref 65–99)
Glucose-Capillary: 140 mg/dL — ABNORMAL HIGH (ref 65–99)

## 2016-02-09 LAB — TROPONIN I: Troponin I: <0.03 ng/mL (ref <0.03)

## 2016-02-09 MED ORDER — ONDANSETRON HCL 4 MG PO TABS
4.0000 mg | ORAL_TABLET | Freq: Four times a day (QID) | ORAL | Status: DC | PRN
  Administered 2016-02-10: 4 mg via ORAL
  Filled 2016-02-09: qty 1

## 2016-02-09 MED ORDER — MORPHINE SULFATE (PF) 2 MG/ML IV SOLN: 1.0000 mg | INTRAVENOUS | Status: DC | PRN

## 2016-02-09 MED ORDER — ONDANSETRON HCL 4 MG/2ML IJ SOLN
4.0000 mg | Freq: Four times a day (QID) | INTRAMUSCULAR | Status: AC | PRN
  Administered 2016-02-09: 4 mg via INTRAVENOUS
  Filled 2016-02-09: qty 2

## 2016-02-09 MED ORDER — ISOSORBIDE MONONITRATE ER 60 MG PO TB24
120.0000 mg | ORAL_TABLET | Freq: Every day | ORAL | Status: DC
Start: 2016-02-09 — End: 2016-02-10
  Administered 2016-02-09 – 2016-02-10 (×2): 120 mg via ORAL
  Filled 2016-02-09 (×2): qty 2

## 2016-02-09 MED ORDER — MORPHINE SULFATE (PF) 2 MG/ML IV SOLN
2.0000 mg | Freq: Once | INTRAVENOUS | Status: AC
  Administered 2016-02-09: 2 mg via INTRAVENOUS
  Filled 2016-02-09: qty 1

## 2016-02-09 NOTE — Care Management Obs Status (Signed)
[facility] NOTIFICATION   Patient Details  Name: [name] MRN: [id number] Date of Birth: 01-10-1956   Medicare Observation Status Notification Given:  Yes    Dawayne Patricia, RN 02/09/2016, 2:15 PM

## 2016-02-09 NOTE — H&P (Signed)
Internal Medicine Attending Admission Note Date: 02/09/2016  Patient name: [name] Medical record number: [id number] Date of birth: 1956-07-03 Age: 60 y.o. Gender: female  I saw and evaluated the patient. I reviewed the resident's note and I agree with the resident's findings and plan as documented in the resident's note.  Chief Complaint(s): Severe right I pain followed by chest pressure times several hours.  History - key components related to admission:  [name] is a 60 year old woman with a history of hypertension, hyperlipidemia, morbid obesity, diabetes complicated by diabetic retinopathy, and coronary artery disease that reportedly is medical management only who presents after developing the acute onset of right eye pain that woke her on the morning of admission and was followed by left-sided chest pressure that radiated to the left arm and was associated with shortness of breath and nausea. The chest pain was worse than usual, occurred at rest, but was relieved with 3 sublingual nitroglycerin. With these symptoms she presented to the emergency department and also noted a several week history of vertigo with any change in position. Her vision was unchanged from baseline which includes no perceptible light in the right eye. She also denies orthopnea, paroxysmal nocturnal dyspnea, fevers, shakes, or chills. She was admitted to the internal medicine teaching service for further evaluation and care.  Physical Exam - key components related to admission:  Filed Vitals:   02/09/16 0954 02/09/16 1000 02/09/16 1019 02/09/16 1046  BP: 160/66 175/89 191/82 201/83  Pulse:      Temp:      TempSrc:      Resp:      Height:      Weight:      SpO2: 100% 99%     Gen.: Well-developed, well-nourished, obese woman lying in bed in mild distress. She is complaining of a severe headache and right eye pain as well as associated chest pain. Chest wall: The chest pain is not reproducible with chest  wall palpation.  Lab results:  Basic Metabolic Panel:  Recent Labs  02/08/16 0804 02/09/16 0054  NA 138 138  K 3.5 3.5  CL 108 112*  CO2 23 22  GLUCOSE 119* 106*  BUN 13 11  CREATININE 1.26* 1.17*  CALCIUM 8.9 8.6*   CBC:  Recent Labs  02/08/16 0804  WBC 12.7*  HGB 13.0  HCT 40.0  MCV 93.2  PLT 280   Cardiac Enzymes:  Recent Labs  02/08/16 1409 02/08/16 1940 02/09/16 0054  TROPONINI <0.03 <0.03 <0.03   CBG:  Recent Labs  02/08/16 1250 02/08/16 1625 02/08/16 2110 02/09/16 0616  GLUCAP 122* 159* 128* 131*   Hemoglobin A1C:  Recent Labs  02/08/16 0804  HGBA1C 6.1*   Imaging results:  Dg Chest 2 View  02/08/2016  CLINICAL DATA:  Chest pain EXAM: CHEST  2 VIEW COMPARISON:  03/18/2015 chest radiograph. FINDINGS: Stable cardiomediastinal silhouette with mild cardiomegaly. No pneumothorax. No pleural effusion. Lungs appear clear, with no acute consolidative airspace disease and no pulmonary edema. IMPRESSION: Stable mild cardiomegaly without pulmonary edema. No active pulmonary disease. Electronically Signed   By: Ilona Sorrel M.D.   On: 02/08/2016 08:24   Ct Head Wo Contrast  02/08/2016  CLINICAL DATA:  Headache and dizziness EXAM: CT HEAD WITHOUT CONTRAST TECHNIQUE: Contiguous axial images were obtained from the base of the skull through the vertex without intravenous contrast. COMPARISON:  None. FINDINGS: Brain: There is age related volume loss. There is no intracranial mass, hemorrhage, extra-axial fluid collection, or midline  shift. There is mild small vessel disease in the centra semiovale bilaterally. Elsewhere, gray-white compartments appear normal. No acute infarct is evident. Vascular: There are foci of calcification in the cavernous carotid artery regions bilaterally. No hyperdense vessels noted. Skull: The bony calvarium appears intact. Sinuses/Orbits: There is increased attenuation material throughout the right globe, possibly due to recent hemorrhage.  The left globe appears normal. Orbits otherwise appear unremarkable and symmetric bilaterally. Visualized paranasal sinuses are clear. Other: Visualized mastoid air cells are clear. IMPRESSION: Mild periventricular small vessel disease. No intracranial mass, hemorrhage, or extra-axial fluid collection. Foci of arterial vascular calcification bilaterally. Diffuse increased attenuation material throughout the right globe, question age uncertain hemorrhage. Acute hemorrhage in the right globe cannot be excluded by CT. Orbits otherwise appear symmetric and unremarkable bilaterally. Electronically Signed   By: Lowella Grip III M.D.   On: 02/08/2016 09:41   Other results:  EKG: Normal sinus rhythm at 63 bpm, normal axis, normal intervals, no significant Q waves, no LVH by voltage, early R wave progression, no ST segment elevation or depression, T-wave inversions in a strain pattern in III, aVF, and V1 with flattening of the T waves in V2 and V3, no significant changes compared to the ECG on 08/25/2015.  Assessment & Plan by Problem:  [name] is a 60 year old woman with a history of hypertension, hyperlipidemia, morbid obesity, diabetes complicated by diabetic retinopathy, and coronary artery disease that reportedly is medical management only who presents after developing the acute onset of right eye pain that woke her on the morning of admission and was followed by left-sided chest pressure that radiated to the left arm and was associated with shortness of breath and nausea. Given her extensive coronary artery disease history and need for multi antianginal therapy including Ranexa her symptoms are most consistent with unstable angina. Fortunately, she has ruled out for a myocardial infarction with serial enzymes and ECGs. There is some room to move on her antianginal agents, which is fortunate given that she reportedly is not candidate for percutaneous coronary intervention or bypass because of poor  targets.  1) Unstable angina: We will continue with the metoprolol XL, Cardizem CD, Imdur, and Ranexa. She will also be maintained on her aspirin, Plavix, and statin therapy. With regards to adjustments we will increase the Imdur dose in hopes of getting better control of her angina from a chronic standpoint. If necessary, we could also increase either the Ranexa dose or the metoprolol. I am hesitant to increase the metoprolol XL dose because of the concomitant nodal calcium channel blocker. Acutely, in addition to the above changes in her chronic regimen, we will utilize as needed morphine. This should also help with the eye pain. Despite the diagnosis of unstable angina we are not using a heparin drip because of the vitreous hemorrhage in the right eye. We are unsure of the chronicity of this but given the acute pain there may be an acute component to the hemorrhage and we do not want to add to the underlying aspirin and Plavix therapy with regards to anticoagulation given this. If we cannot get control of her angina with the morphine and increasing her Imdur we will consult cardiology for recommendations on adjustment of the Ranexa or further changes in her regimen that could include increasing the metoprolol XL while changing the Cardizem to a non-nodal calcium channel blocker as an additional anti-anginal agent that will allow [name] to push the beta blocker dose.  2) Right vitreal hemorrhage: It is  unclear the timing of this but we will avoid overly excessive anticoagulation with the start of a heparin drip. We will treat symptomatically with morphine as needed. We appreciate Ophthalmology's evaluation and recommendations.  3) Disposition: I anticipate she will require at least another 24 hours of observation to assure we can get control of her angina medically. If we are unable to do so as outlined above she may require further changes in her regimen as guided by a cardiology specialist.

## 2016-02-09 NOTE — Progress Notes (Signed)
Patient sitting up in chair, no needs at this time. Call light within reach.

## 2016-02-09 NOTE — Progress Notes (Signed)
   Subjective: Pt. C/O pressure like chest pain , 9/10, radiating to her left shoulder started about 20 minutes ago, accompanied with N/V. Also C/O headaches. Denies any worsening SOB, or palpitation. Objective:Looks in distress Vital signs in last 24 hours: Filed Vitals:   02/09/16 0954 02/09/16 1000 02/09/16 1019 02/09/16 1046  BP: 160/66 175/89 191/82 201/83  Pulse:      Temp:      TempSrc:      Resp:      Height:      Weight:      SpO2: 100% 99%     Physical Exam Pt. was in distress due to pain.  Chest: No tenderness,Clear,No added sounds. CVS: RRR, No r/m/g Abd: Obese,soft, non tender. No orgamomegaly. BS +ve  Stat EKG: No acute changes.  MEDS: reviewed. Increase her Imdur to 120mg  QIH   Labs: CR. 1.17           Trending Trop. <0.03   Assessment/Plan: 1: Chest pain/Angina: Due to her PMH of extensive coronary artery disease, DM, HTN, HL and with the typical characteristics of pain, Angina is most possible cause. Although her EKG doesn't show any acute changes during and after pain and trending trops were negative for any acute ischemia. Her pain today resolved after 2 nitroglycerine SL and 2mg  of morphine. As she is being medically managed and not a good candidate for any intervention, we will increase her Imdur to 120 mg  And see her response. If needed can increase her Renexa and change her diltiazem with Amlodipine.  2: Eye Pain: Has improved today. She had a Vitreous hge.which is common in diabetic eye. As diagnosed by ophthalmology yesterday. As she is blind in that eye, no proposed intervention.  3:DM: Continue home meds. And sliding scale  4: HTN: Currently slightly elevated, may be due to pain and was not given her regular meds. Due to her N/V.Marland Kitchen Continue with meds.And recheck once comfortable and had her regular medicines.  Dispo: Anticipated discharge in approximately 1-2 day(s).     Lorella Nimrod, MD 02/09/2016, 11:43 AM Pager: [phone]

## 2016-02-09 NOTE — Hospital Discharge Follow-Up (Signed)
The patient is known to the [facility].  Met with her to discuss the [facility] Clinic at the [facility]; but she was not interested and stated that she would like to follow up with her [facility] PCP - Dr Adrian Blackwater. An appointment was scheduled for 02/19/16 @ 1200 and the information was placed on the AVS.   She said that uses [employer] transportation to get to the clinic. She noted that she has support at home from her husband and has her prescriptions filled at [facility].  She [employer] and medicaid.   Marvetta Gibbons, RN notified that the patient will be following up with her PCP.

## 2016-02-10 ENCOUNTER — Observation Stay (HOSPITAL_BASED_OUTPATIENT_CLINIC_OR_DEPARTMENT_OTHER): Payer: Medicare Other

## 2016-02-10 DIAGNOSIS — I509 Heart failure, unspecified: Secondary | ICD-10-CM | POA: Diagnosis not present

## 2016-02-10 DIAGNOSIS — I200 Unstable angina: Secondary | ICD-10-CM | POA: Insufficient documentation

## 2016-02-10 DIAGNOSIS — H4311 Vitreous hemorrhage, right eye: Secondary | ICD-10-CM

## 2016-02-10 DIAGNOSIS — I209 Angina pectoris, unspecified: Secondary | ICD-10-CM | POA: Diagnosis not present

## 2016-02-10 DIAGNOSIS — R42 Dizziness and giddiness: Secondary | ICD-10-CM | POA: Diagnosis not present

## 2016-02-10 DIAGNOSIS — H5711 Ocular pain, right eye: Secondary | ICD-10-CM | POA: Diagnosis not present

## 2016-02-10 LAB — GLUCOSE, CAPILLARY
GLUCOSE-CAPILLARY: 227 mg/dL — AB (ref 65–99)
GLUCOSE-CAPILLARY: 235 mg/dL — AB (ref 65–99)

## 2016-02-10 LAB — ECHOCARDIOGRAM COMPLETE
HEIGHTINCHES: 63 in
Weight: 4353.6 oz

## 2016-02-10 MED ORDER — ISOSORBIDE MONONITRATE ER 120 MG PO TB24: 120.0000 mg | ORAL_TABLET | Freq: Every day | ORAL | Status: DC

## 2016-02-10 MED ORDER — ONDANSETRON HCL 4 MG PO TABS: 4.0000 mg | ORAL_TABLET | Freq: Four times a day (QID) | ORAL | Status: DC | PRN

## 2016-02-10 MED ORDER — TIMOLOL MALEATE 0.5 % OP SOLN: 1.0000 [drp] | Freq: Two times a day (BID) | OPHTHALMIC | Status: DC

## 2016-02-10 MED ORDER — BRIMONIDINE TARTRATE 0.2 % OP SOLN: 1.0000 [drp] | Freq: Two times a day (BID) | OPHTHALMIC | Status: DC

## 2016-02-10 NOTE — Progress Notes (Signed)
   Subjective: Ms. Carkhuff is feeling better, C/O mild nausea after seeing her breakfast. Denies any vomiting, diarrhea. C/O constipation most of the time since last 2 yrs. She denies any chest pain, SOB, palpitation, headaches. Wants to go home today. Objective: Vital signs in last 24 hours: Filed Vitals:   02/09/16 1046 02/09/16 1400 02/09/16 2028 02/10/16 0323  BP: 201/83 141/52 116/56 120/60  Pulse:  72 64 64  Temp:  98.2 F (36.8 C) 98 F (36.7 C) 98.3 F (36.8 C)  TempSrc:  Oral Oral Oral  Resp:  18 18 18   Height:      Weight:    272 lb 1.6 oz (123.424 kg)  SpO2:  100% 100% 100%   Physical Exam Pt. Was comfortably lying in bed.Alert and Oriented. Chest: No tenderness,Clear,No added sounds. CVS: RRR, No r/m/g Abd: Obese,soft, non tender. No orgamomegaly. BS +ve Extremities: Mild LL edema up to mid legs bilaterally.Peripheral pulses +Ve bilaterally.  Meds: Reviewed  Labs:  CBG. 227  Assessment/Plan:  1: Chest pain/Angina: Due to her PMH of extensive coronary artery disease, DM, HTN, HL and with the typical characteristics of pain, Angina is most possible cause. Although her EKG doesn't show any acute changes during and after pain and trending trops were negative for any acute ischemia.  As she is being medically managed and not a good candidate for any intervention, we have increase her Imdur to 120 mg yesterday, she didn't experience any chest pain since yesterday morning episode. If needed can increase her Renexa and change her diltiazem with Amlodipine.  2: Nausea: She C/O of nausea mostly in the morning since last few weeks. Also remains constipated most of time. Denies any abdominal pain and there is no abdominal tenderness.We can give her some Zofran PRN and Senakot QPM to regulate her BM.   3: Eye Pain: Has improved today. She had a Vitreous hge.which is common in diabetic eye. As diagnosed by ophthalmology yesterday. As she is blind in that eye, no proposed  intervention.  4:DM: Her CBG . this morning was 227. Continue home meds. And sliding scale.  4: HTN: She is normotensive today.. Continue with meds.  Dispo: Anticipated discharge today.   Lorella Nimrod, MD 02/10/2016, 7:52 AM Pager: [phone]

## 2016-02-10 NOTE — Discharge Summary (Signed)
Name: [name] MRN: [id number] DOB: [birth year] 60 y.o. PCP: Elizabeth Nearing, MD  Date of Admission: 02/08/2016  7:38 AM Date of Discharge: 02/10/2016 Attending Physician: Elizabeth Linsey, MD  Discharge Diagnosis: 1. Angina pectoris 2:Vitreous hemorrhage   Discharge Medications:   Medication List    TAKE these medications        ACCU-CHEK FASTCLIX LANCETS Misc  1 each by Other route 3 (three) times daily.     acetaminophen 500 MG tablet  Commonly known as:  TYLENOL  Take 500 mg by mouth every 6 (six) hours as needed. For pain     acetaZOLAMIDE 500 MG capsule  Commonly known as:  DIAMOX  Take 500 mg by mouth 2 (two) times daily.     aspirin 81 MG tablet  Take 81 mg by mouth daily.     atorvastatin 80 MG tablet  Commonly known as:  LIPITOR  Take 1 tablet (80 mg total) by mouth daily at 6 PM.     baclofen 10 MG tablet  Commonly known as:  LIORESAL  Take 1 tablet (10 mg total) by mouth at bedtime as needed for muscle spasms.     bismuth subsalicylate 812 XN/17GY suspension  Commonly known as:  PEPTO BISMOL  Take 30 mLs by mouth every 6 (six) hours as needed for indigestion.     brimonidine 0.2 % ophthalmic solution  Commonly known as:  ALPHAGAN  Place 1 drop into both eyes every 12 (twelve) hours.     calcium carbonate 500 MG chewable tablet  Commonly known as:  TUMS - dosed in mg elemental calcium  Chew 1 tablet by mouth 2 (two) times daily as needed for indigestion.     clopidogrel 75 MG tablet  Commonly known as:  PLAVIX  Take 1 tablet (75 mg total) by mouth daily.     COMBIGAN 0.2-0.5 % ophthalmic solution  Generic drug:  brimonidine-timolol  Place 1 drop into both eyes every 12 (twelve) hours.     diltiazem 240 MG 24 hr capsule  Commonly known as:  DILACOR XR  TAKE ONE CAPSULE BY MOUTH ONCE DAILY     ferrous sulfate 325 (65 FE) MG tablet  Take 325 mg by mouth daily with breakfast.     furosemide 80 MG tablet  Commonly known as:  LASIX  Take  1 tablet (80 mg total) by mouth daily.     glucose blood test strip  Use TID before meals / Dx E11.9     glucose monitoring kit monitoring kit  1 each by Does not apply route 4 (four) times daily - after meals and at bedtime.     ACCU-CHEK AVIVA PLUS w/Device Kit  Used as directed     hydrocortisone cream 1 %  Apply 1 application topically daily as needed for itching.     insulin lispro protamine-lispro (75-25) 100 UNIT/ML Susp injection  Commonly known as:  HUMALOG MIX 75/25  Inject 65 Units into the skin 2 (two) times daily with a meal.     Insulin Syringe-Needle U-100 30G X 5/16" 1 ML Misc  Commonly known as:  RELION INSULIN SYR 1CC/30G  1 each by Other route 2 (two) times daily. USE AS DIRECTED     RELION INSULIN SYR 1CC/30G 30G X 5/16" 1 ML Misc  Generic drug:  Insulin Syringe-Needle U-100  USE AS DIRECTED     isosorbide mononitrate 120 MG 24 hr tablet  Commonly known as:  IMDUR  Take 1 tablet (120  mg total) by mouth daily.     LUMIGAN 0.01 % Soln  Generic drug:  bimatoprost  Place 1 drop into both eyes 2 (two) times daily.     metoprolol 200 MG 24 hr tablet  Commonly known as:  TOPROL-XL  Take 1 tablet (200 mg total) by mouth daily.     MUSCLE RUB EX  Apply 1 application topically daily as needed (pain).     nitroGLYCERIN 0.4 MG SL tablet  Commonly known as:  NITROSTAT  DISSOLVE ONE TABLET UNDER THE TONGUE EVERY 5 MINUTES AS NEEDED FOR CHEST PAIN.  DO NOT EXCEED A TOTAL OF 3 DOSES IN 15 MINUTES     ondansetron 4 MG tablet  Commonly known as:  ZOFRAN  Take 1 tablet (4 mg total) by mouth every 6 (six) hours as needed for nausea.     pantoprazole 40 MG tablet  Commonly known as:  PROTONIX  TAKE ONE TABLET BY MOUTH ONCE DAILY     polyethylene glycol packet  Commonly known as:  MIRALAX / GLYCOLAX  Take 17 g by mouth daily as needed for mild constipation.     polyvinyl alcohol 1.4 % ophthalmic solution  Commonly known as:  LIQUIFILM TEARS  Place 1 drop into  both eyes daily as needed. For dry eyes     ranolazine 500 MG 12 hr tablet  Commonly known as:  RANEXA  Take 1 tablet (500 mg total) by mouth 2 (two) times daily.     timolol 0.5 % ophthalmic solution  Commonly known as:  TIMOPTIC  Place 1 drop into both eyes every 12 (twelve) hours.        Disposition and follow-up:   [name] was discharged from [facility] in Stable condition.  At the hospital follow up visit please address:  1.  If she continue to have Nausea, might be due to diabetic gastroparesis, may require some work up and management.   2.  Labs / imaging needed at time of follow-up: None  3.  Pending labs/ test needing follow-up: None  Follow-up Appointments: Follow-up Information    Follow up with [facility] on 02/19/2016.   Specialty:  Internal Medicine   Why:  at 12:00pm for an appointment with Dr [name] information:   [place] [name] [id number] [place] [phone]      Follow up with Elizabeth Klein, MD On 02/17/2016.   Specialty:  Cardiology   Why:  Appointment with Cardiology on 02/17/16 at 9:30 am   Contact information:   [address] [place] [address] [phone]       Schedule an appointment as soon as possible for a visit in 1 week to follow up.   Why:  Please make an appointment with your ophthalmologist within next week.      Hospital Course by problem list: 1: Angina Pectoris(HCC): [name] 60 YO lady with PMH of DM, HTN, HL with extensive multivessel coronary artery disease ( medically managed) came with C/O left sided pressure like chest pain radiating to left arm, 9/10 , accompanied with SOB, palpitation and N/V. She has an H/O PND and sleeps on 5 pillows.Chest pain relieved after 3 Nitroglycerine SL.Her EKG did not show any acute changes, not even during her chest pain which reoccur next morning. Her trending troponin  was negative. Her chest pain in the hospital was managed with Nitroglycerine SL X 2.and 2 mg  of morphine.We increased her Imdur to 120 mg QHD and she remained pain free since then. She is going to see her cardiologist Dr. Sallyanne Burns on 7/18. To further adjust her medicine.  2::Vitreous hemorrhage: She came with C/O right eye pain which woke her up at 3.00am, got some relieved after 1000 mg of tylenol. 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 Her CT scan shows some hemorrhage in her right globe. Ophthalmology was consulted and they diagnosed her with  Vitreous hemorrhage OD secondary to diabetic retinopathy. No intervention recommended in setting of No Light Perception eye. She need to F/U with her ophthalmologist within 1 week after discharge.  3: Dizziness; As she reports being dizzy over few weeks, CT head was done to rule out any posterior infarcts and did not show any intra cranial abnormalities. May be due to her meds. S/E.  4:DM: Remain under control and we continue home meds. And sliding scale.  5:HTN: She remains normotensive during most of her stay, except during acute chest pain.We  continue with her home meds.  6:Nausea: She C/O getting nausea almost every day since last few weeks, mostly managed with some modification of diet at home. She was given Zofran in hospital which she said really helped. If the nausea persists she might need a work up and management as that might be due to diabetic gastroparesis.  Discharge Vitals:   BP 120/60 mmHg  Pulse 64  Temp(Src) 98.3 F (36.8 C) (Oral)  Resp 18  Ht _0  (1.6 m)  Wt 272 lb 1.6 oz (123.424 kg)  BMI 48.21 kg/m2  SpO2 100%  LMP 04/02/2012   Physical Exam Pt. Was comfortably lying in bed.Alert and Oriented. Chest: No tenderness,Clear,No added sounds. CVS: RRR, No r/m/g Abd: Obese,soft, non tender. No orgamomegaly. BS +ve Extremities: Mild LL edema up to mid legs bilaterally.Peripheral pulses +Ve bilaterally.  Pertinent Labs,  Studies, and Procedures:   LABS:  Trop. <0.03 X 3  CBG 122 - 235 BMET: Creatinine 1.26( base line 1.18)  1.17 CBC: WBC 12.7   EKG:  Date/Time: Sunday February 08 2016 07:44:33 EDT Ventricular Rate: 63 PR Interval: 150 QRS Duration: 104 QT Interval: 438 QTC Calculation: 448 R Axis: 11 Normal sinus rhythm RSR' or QR pattern in V1 suggests right ventricular conduction delay Borderline ECG No acute changes No significant change since last tracing  CXR: IMPRESSION: Stable mild cardiomegaly without pulmonary edema. No active pulmonary disease   CT Head WO contrast: FINDINGS: Brain: There is age related volume loss. There is no intracranial mass, hemorrhage, extra-axial fluid collection, or midline shift. There is mild small vessel disease in the centra semiovale bilaterally. Elsewhere, gray-white compartments appear normal. No acute infarct is evident.  Vascular: There are foci of calcification in the cavernous carotid artery regions bilaterally. No hyperdense vessels noted.  Skull: The bony calvarium appears intact.  Sinuses/Orbits: There is increased attenuation material throughout the right globe, possibly due to recent hemorrhage. The left globe appears normal. Orbits otherwise appear unremarkable and symmetric bilaterally. Visualized paranasal sinuses are clear.  Other: Visualized mastoid air cells are clear.  IMPRESSION: Mild periventricular small vessel disease. No intracranial mass, hemorrhage, or extra-axial fluid collection. Foci of arterial vascular calcification bilaterally. Diffuse increased attenuation material throughout the right globe, question age uncertain hemorrhage. Acute hemorrhage in the right globe cannot be excluded by CT. Orbits otherwise appear symmetric and unremarkable Bilaterally.  Discharge Instructions: Discharge Instructions    Diet - low sodium heart healthy  Complete by:  As directed      Discharge  instructions    Complete by:  As directed   It was pleasure taking care of you. Please follow up with Dr.Croitoru on 7/18 at 9:30. If your nausea persists call your PCP to if they want to give you something for your stomach slowing.     Increase activity slowly    Complete by:  As directed            Signed: Lorella Nimrod, MD 02/10/2016, 11:08 AM   Pager: [phone]

## 2016-02-10 NOTE — Progress Notes (Signed)
Pt/family given discharge instructions, medication lists, follow up appointments, and when to call the doctor.  Pt/family verbalizes understanding. Patient picked up by nephew for discharge.  All questions answered. Payton Emerald, RN

## 2016-02-10 NOTE — Progress Notes (Signed)
Echocardiogram 2D Echocardiogram has been performed.  [name] 02/10/2016, 2:42 PM

## 2016-02-10 NOTE — Progress Notes (Signed)
Internal Medicine Attending  Date: 02/10/2016  Patient name: [name] Medical record number: [id number] Date of birth: [birth year] Age: 60 y.o. Gender: female  I saw and evaluated the patient. I reviewed the resident's note by Dr. Reesa Chew and I agree with the resident's findings and plans as documented in her progress note.  When seen on rounds this morning Ms. [name] looked comfortable and happy. She's had no further episodes of chest pain since yesterday morning. She is tolerating the increased Imdur well. Please note that if further changes in her antianginal regimen are necessary as an outpatient the reason to switch the diltiazem to amlodipine is to allow for an increase in the metoprolol XL as an antianginal as well. If her nausea persists the differential should consider diabetic gastroparesis as this may require a change in her eating pattern or additional pharmacotherapy if dietary changes are ineffective assuming this is the cause. She is stable for discharge home today with follow-up in her primary care provider's clinic.

## 2016-02-17 ENCOUNTER — Ambulatory Visit: Payer: Medicare Other | Admitting: Cardiovascular Disease

## 2016-02-19 ENCOUNTER — Inpatient Hospital Stay: Payer: Medicare Other | Admitting: Family Medicine

## 2016-02-25 ENCOUNTER — Telehealth: Payer: Self-pay

## 2016-02-25 NOTE — Telephone Encounter (Signed)
Contacted pt and spoke with husband and is aware of echo results

## 2016-03-03 ENCOUNTER — Other Ambulatory Visit: Payer: Self-pay | Admitting: Cardiovascular Disease

## 2016-03-03 NOTE — Telephone Encounter (Signed)
Rx request sent to pharmacy.  

## 2016-03-05 ENCOUNTER — Other Ambulatory Visit: Payer: Self-pay | Admitting: Pharmacist

## 2016-03-05 MED ORDER — ACCU-CHEK SOFTCLIX LANCETS MISC: 5 refills | Status: DC

## 2016-03-07 ENCOUNTER — Other Ambulatory Visit: Payer: Self-pay | Admitting: Internal Medicine

## 2016-03-18 ENCOUNTER — Other Ambulatory Visit: Payer: Self-pay | Admitting: Internal Medicine

## 2016-03-18 ENCOUNTER — Other Ambulatory Visit: Payer: Self-pay | Admitting: Physician Assistant

## 2016-03-18 NOTE — Telephone Encounter (Signed)
REFILL 

## 2016-03-18 NOTE — Telephone Encounter (Signed)
Review for refill. 

## 2016-03-18 NOTE — Telephone Encounter (Signed)
Last appointment 09/12/2015

## 2016-03-19 ENCOUNTER — Ambulatory Visit (INDEPENDENT_AMBULATORY_CARE_PROVIDER_SITE_OTHER): Payer: Medicare Other | Admitting: Cardiovascular Disease

## 2016-03-19 ENCOUNTER — Encounter: Payer: Self-pay | Admitting: Cardiovascular Disease

## 2016-03-19 ENCOUNTER — Encounter (INDEPENDENT_AMBULATORY_CARE_PROVIDER_SITE_OTHER): Payer: Self-pay

## 2016-03-19 VITALS — BP 140/70 | HR 72 | Ht 63.0 in | Wt 275.8 lb

## 2016-03-19 DIAGNOSIS — E118 Type 2 diabetes mellitus with unspecified complications: Secondary | ICD-10-CM

## 2016-03-19 DIAGNOSIS — I5032 Chronic diastolic (congestive) heart failure: Secondary | ICD-10-CM

## 2016-03-19 DIAGNOSIS — I25118 Atherosclerotic heart disease of native coronary artery with other forms of angina pectoris: Secondary | ICD-10-CM

## 2016-03-19 DIAGNOSIS — E785 Hyperlipidemia, unspecified: Secondary | ICD-10-CM

## 2016-03-19 DIAGNOSIS — Z794 Long term (current) use of insulin: Secondary | ICD-10-CM

## 2016-03-19 DIAGNOSIS — N189 Chronic kidney disease, unspecified: Secondary | ICD-10-CM

## 2016-03-19 DIAGNOSIS — N183 Chronic kidney disease, stage 3 unspecified: Secondary | ICD-10-CM

## 2016-03-19 NOTE — Progress Notes (Signed)
Patient ID: [name], female   DOB: [birth year], 60 y.o.   MRN: [id number]    Cardiology Office Note    Date:  03/19/2016   ID:  Elizabeth, Burns Mar 31, 1956, MRN [id number]  PCP:  Minerva Ends, MD  Cardiologist:   Sanda Klein, MD   No chief complaint on file.   History of Present Illness:  [name] is a 60 y.o. female with long-standing severe diabetes mellitus with multiple microvascular and macrovascular complications. She has extensive multivessel coronary artery disease with diffuse involvement, making her a poor candidate for either surgical or percutaneous revascularization.   She was recently hospitalized with vitreous hemorrhage and some chest discomfort, workup was benign. Her dose of isosorbide was increased to 120 mg daily.  She has done quite well with medical therapy with very infrequent episodes of angina at rest or with activity. She is currently receiving both long-acting nitrates and ranolazine in addition to beta blockers and calcium channel blockers in high doses.   She is able to perform usual activities without dyspnea or angina - NYHA/CCS class 2. She denies palpitations or syncope. She is very dizzy when she tries to walk upstairs. She has extremely poor vision which contributes to her lack of balance. Thankfully, she has not had any serious falls.  Estimated control has been excellent and she recently has begun having some episodes of symptomatic hypoglycemia (no worse than 57 mg/deciliter).  Her weight has been stable at 270-280 pounds for the last couple of years. She has chronic moderate ankle edema, probably in part related to treatment with calcium channel blockers, but also to chronic kidney disease and heart failure.  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 She also has systemic  hypertension and left ventricular hypertrophy with chronic diastolic heart failure in the setting of morbid obesity and obstructive sleep apnea. Her most recent left ventricular ejection fraction was 55-60% by the echocardiogram performed in July 2017. Estimated systolic PA pressure was 37 mmHg.  Past Medical History:  Diagnosis Date  . Anemia   . Blind right eye   . Chronic back pain    "my whole back" (03/18/2015)  . Chronic diastolic heart failure ([place]) 9/13   echo 03/20/15 LV Ef of 60-65%, grade 2DD and PA peak pressure: 36 mm Hg   . Coronary artery disease    Medical Rx  . Diabetic retinopathy ([place])   . GERD (gastroesophageal reflux disease)   . Headache   . History of blood transfusion "several"   "related to menopause"  . Hyperlipemia   . Hypertension   . Morbid obesity with BMI of 40.0-44.9, adult ([place])   . Myocardial infarction ([place]) "several"  . Neuropathy ([place])    hands    diabetic neuropthy  . Obstructive sleep apnea    "they say I do; I say I don't; no mask" (03/18/2015)  . Renal insufficiency   . Type II diabetes mellitus (HCC)    insulin dependent    Past Surgical History:  Procedure Laterality Date  . CARDIAC CATHETERIZATION  July 2010  . CATARACT EXTRACTION, BILATERAL Bilateral   . DILATION AND CURETTAGE OF UTERUS    . EYE SURGERY Bilateral "several"  . INCISION AND DRAINAGE PERIRECTAL ABSCESS  04/03/2012    Outpatient Medications Prior to Visit  Medication Sig Dispense Refill  . ACCU-CHEK SOFTCLIX LANCETS lancets Use as instructed for 3  times daily blood glucose monitoring 100 each 5  . acetaminophen (TYLENOL) 500 MG tablet Take 500 mg by mouth every 6 (six) hours as needed. For pain    . acetaZOLAMIDE (DIAMOX) 500 MG capsule Take 500 mg by mouth 2 (two) times daily.    Marland Kitchen aspirin 81 MG tablet Take 81 mg by mouth daily.    Marland Kitchen atorvastatin (LIPITOR) 80 MG tablet Take 1 tablet (80 mg total) by mouth daily at 6 PM. 90 tablet 3  . baclofen (LIORESAL) 10 MG  tablet Take 1 tablet (10 mg total) by mouth at bedtime as needed for muscle spasms. 30 each 2  . bismuth subsalicylate (PEPTO BISMOL) 262 MG/15ML suspension Take 30 mLs by mouth every 6 (six) hours as needed for indigestion.     . Blood Glucose Monitoring Suppl (ACCU-CHEK AVIVA PLUS) w/Device KIT Used as directed 1 kit 0  . brimonidine (ALPHAGAN) 0.2 % ophthalmic solution Place 1 drop into both eyes every 12 (twelve) hours. 5 mL 12  . brimonidine-timolol (COMBIGAN) 0.2-0.5 % ophthalmic solution Place 1 drop into both eyes every 12 (twelve) hours.    . calcium carbonate (TUMS - DOSED IN MG ELEMENTAL CALCIUM) 500 MG chewable tablet Chew 1 tablet by mouth 2 (two) times daily as needed for indigestion.     . clopidogrel (PLAVIX) 75 MG tablet Take 1 tablet (75 mg total) by mouth daily. KEEP OV. 90 tablet 0  . diltiazem (DILACOR XR) 240 MG 24 hr capsule TAKE ONE CAPSULE BY MOUTH ONCE DAILY 90 capsule 3  . ferrous sulfate 325 (65 FE) MG tablet Take 325 mg by mouth daily with breakfast.    . furosemide (LASIX) 80 MG tablet Take 1 tablet (80 mg total) by mouth daily. 90 tablet 3  . glucose blood test strip Use TID before meals / Dx E11.9 100 each 12  . glucose monitoring kit (FREESTYLE) monitoring kit 1 each by Does not apply route 4 (four) times daily - after meals and at bedtime. 1 each 1  . hydrocortisone cream 1 % Apply 1 application topically daily as needed for itching.    . insulin lispro protamine-lispro (HUMALOG MIX 75/25) (75-25) 100 UNIT/ML SUSP injection Inject 65 Units into the skin 2 (two) times daily with a meal. 40 mL 5  . Insulin Syringe-Needle U-100 (RELION INSULIN SYR 1CC/30G) 30G X 5/16" 1 ML MISC 1 each by Other route 2 (two) times daily. USE AS DIRECTED 200 each 3  . isosorbide mononitrate (IMDUR) 120 MG 24 hr tablet Take 1 tablet (120 mg total) by mouth daily. 30 tablet 1  . LUMIGAN 0.01 % SOLN Place 1 drop into both eyes 2 (two) times daily.    . Menthol-Methyl Salicylate (MUSCLE RUB  EX) Apply 1 application topically daily as needed (pain).    . metoprolol (TOPROL-XL) 200 MG 24 hr tablet Take 1 tablet (200 mg total) by mouth daily. 90 tablet 3  . nitroGLYCERIN (NITROSTAT) 0.4 MG SL tablet DISSOLVE ONE TABLET UNDER THE TONGUE EVERY 5 MINUTES AS NEEDED FOR CHEST PAIN.  DO NOT EXCEED A TOTAL OF 3 DOSES IN 15 MINUTES 25 tablet 5  . ondansetron (ZOFRAN) 4 MG tablet Take 1 tablet (4 mg total) by mouth every 6 (six) hours as needed for nausea. 20 tablet 0  . pantoprazole (PROTONIX) 40 MG tablet TAKE ONE TABLET BY MOUTH ONCE DAILY 30 tablet 2  . polyethylene glycol (MIRALAX / GLYCOLAX) packet Take 17 g by mouth daily as needed for mild  constipation.    . polyvinyl alcohol (LIQUIFILM TEARS) 1.4 % ophthalmic solution Place 1 drop into both eyes daily as needed. For dry eyes    . ranolazine (RANEXA) 500 MG 12 hr tablet Take 1 tablet (500 mg total) by mouth 2 (two) times daily. 180 tablet 3  . RELION INSULIN SYR 1CC/30G 30G X 5/16" 1 ML MISC USE AS DIRECTED 100 each 2  . timolol (TIMOPTIC) 0.5 % ophthalmic solution Place 1 drop into both eyes every 12 (twelve) hours. 10 mL 12   No facility-administered medications prior to visit.      Allergies:   Lorazepam and Orange fruit [citrus]   Social History   Social History  . Marital status: Married    Spouse name: N/A  . Number of children: 0  . Years of education: N/A   Social History Main Topics  . Smoking status: Never Smoker  . Smokeless tobacco: Never Used  . Alcohol use No  . Drug use: No  . Sexual activity: Yes   Other Topics Concern  . None   Social History Narrative  . None     Family History:  The patient's family history includes Colon polyps in her father; Diabetes in her brother, father, maternal grandmother, mother, and sister; Heart attack in her father and mother; Heart disease in her brother, father, mother, and sister; Hypertension in her brother, father, maternal grandmother, mother, and sister; Stroke in  her father.   ROS:   Please see the history of present illness.    ROS All other systems reviewed and are negative.   PHYSICAL EXAM:   VS:  BP 140/70   Pulse 72   Ht '5\' 3"'  (1.6 m)   Wt 275 lb 12.8 oz (125.1 kg)   LMP 04/02/2012   BMI 48.86 kg/m    GEN: Well nourished, well developed, in no acute distress  HEENT: normal  Neck: no JVD, carotid bruits, or masses Cardiac: RRR; no murmurs, rubs, or gallops,symmetrical 2+ ankle and pedal edema  Respiratory:  clear to auscultation bilaterally, normal work of breathing GI: soft, nontender, nondistended, + BS MS: no deformity or atrophy  Skin: warm and dry, no rash Neuro:  Alert and Oriented x 3, Strength and sensation are intact Psych: euthymic mood, full affect  Wt Readings from Last 3 Encounters:  03/19/16 275 lb 12.8 oz (125.1 kg)  02/10/16 272 lb 1.6 oz (123.4 kg)  11/28/15 271 lb 6.4 oz (123.1 kg)      Studies/Labs Reviewed:   EKG:  EKG is ordered today.  The ekg ordered today demonstrates normal sinus rhythm, incomplete right bundle branch block, lateral ST depression and T-wave inversion V3-V6, not much change from previous tracings. The QTc interval is 443 ms  Recent Labs: 10/29/2015: ALT 10 02/08/2016: Hemoglobin 13.0; Platelets 280 02/09/2016: BUN 11; Creatinine, Ser 1.17; Potassium 3.5; Sodium 138   Lipid Panel    Component Value Date/Time   CHOL 103 10/29/2015 1012   TRIG 75 10/29/2015 1012   HDL 40 10/29/2015 1012   CHOLHDL 2.6 10/29/2015 1012   CHOLHDL 2.9 09/12/2015 1147   VLDL 12 09/12/2015 1147   LDLCALC 48 10/29/2015 1012    ASSESSMENT:    1. Chronic diastolic heart failure, NYHA class 3 ([place])   2. Chronic renal insufficiency, stage III (moderate)   3. Atherosclerosis of native coronary artery of native heart with other form of angina pectoris ([place])   4. Controlled type 2 diabetes mellitus with complication, with long-term current  use of insulin ([place])   5. Dyslipidemia      PLAN:  In order of  problems listed above:  1. CHF: NYHA class 2, clearly with signs of hypervolemia, which could be related either to cardiac or renal disease. Discussed sodium restriction.Due to her renal insufficiency, would not increase her diuretic dose unless she develops symptoms of dyspnea. 2. CKD stg. 3: Recently creatinine has been around 1.2  3. CAD: due to diffuse nature of disease, not a candidate for er surgical or percutaneous revascularization. Currently CCS class II on 4 different antianginal drugs. 4. DM: Recently with episodes of symptomatic, albeit mild hypoglycemia. Hemoglobin A1c is excellent at 6.1%. Consider reducing her insulin dose. 5. MRA:JHHI recent LDL was excellent at 48.   Medication Adjustments/Labs and Tests Ordered: Current medicines are reviewed at length with the patient today.  Concerns regarding medicines are outlined above.  Medication changes, Labs and Tests ordered today are listed in the Patient Instructions below. Patient Instructions  Medication Instructions:  Your physician recommends that you continue on your current medications as directed. Please refer to the Current Medication list given to you today.  Labwork: None ordered  Testing/Procedures: None ordered  Follow-Up: Your physician wants you to follow-up in: [place]. You will receive a reminder letter in the mail two months in advance. If you don't receive a letter, please call our office to schedule the follow-up appointment.  Any Other Special Instructions Will Be Listed Below (If Applicable).     If you need a refill on your cardiac medications before your next appointment, please call your pharmacy.       Signed, Sanda Klein, MD  03/19/2016 1:07 PM    [facility], [place], [place]  [address] Phone: [phone]; Fax: [phone]

## 2016-03-19 NOTE — Patient Instructions (Signed)
Medication Instructions:  Your physician recommends that you continue on your current medications as directed. Please refer to the Current Medication list given to you today.   Labwork: None ordered  Testing/Procedures: None ordered   Follow-Up: Your physician wants you to follow-up in: 6 MONTHS WITH DR. CROITORU   You will receive a reminder letter in the mail two months in advance. If you don't receive a letter, please call our office to schedule the follow-up appointment.   Any Other Special Instructions Will Be Listed Below (If Applicable).     If you need a refill on your cardiac medications before your next appointment, please call your pharmacy.   

## 2016-03-22 ENCOUNTER — Other Ambulatory Visit: Payer: Self-pay | Admitting: Internal Medicine

## 2016-03-24 ENCOUNTER — Other Ambulatory Visit: Payer: Self-pay | Admitting: Internal Medicine

## 2016-04-01 ENCOUNTER — Telehealth: Payer: Self-pay | Admitting: Family Medicine

## 2016-04-01 DIAGNOSIS — K3184 Gastroparesis: Principal | ICD-10-CM

## 2016-04-01 DIAGNOSIS — E1143 Type 2 diabetes mellitus with diabetic autonomic (poly)neuropathy: Secondary | ICD-10-CM

## 2016-04-01 MED ORDER — ONDANSETRON HCL 4 MG PO TABS: 4.0000 mg | ORAL_TABLET | Freq: Four times a day (QID) | ORAL | 2 refills | Status: DC | PRN

## 2016-04-01 NOTE — Telephone Encounter (Signed)
Patient has not been seen in a while - will forward request to Dr. Adrian Blackwater

## 2016-04-01 NOTE — Telephone Encounter (Signed)
zofran sent to patient's [facility]

## 2016-04-01 NOTE — Telephone Encounter (Signed)
Pt. Called requesting a refill on ondansetron (ZOFRAN) 4 MG tablet. Pt. Uses [facility] pharmacy. Please f/u with pt.

## 2016-04-08 ENCOUNTER — Ambulatory Visit: Payer: Medicare Other | Admitting: Family Medicine

## 2016-04-09 ENCOUNTER — Encounter: Payer: Self-pay | Admitting: Family Medicine

## 2016-04-09 ENCOUNTER — Ambulatory Visit: Payer: Medicare Other | Attending: Family Medicine | Admitting: Family Medicine

## 2016-04-09 VITALS — BP 144/84 | HR 70 | Temp 98.3°F | Wt 244.2 lb

## 2016-04-09 DIAGNOSIS — Z7982 Long term (current) use of aspirin: Secondary | ICD-10-CM | POA: Insufficient documentation

## 2016-04-09 DIAGNOSIS — K3184 Gastroparesis: Secondary | ICD-10-CM | POA: Diagnosis not present

## 2016-04-09 DIAGNOSIS — I110 Hypertensive heart disease with heart failure: Secondary | ICD-10-CM | POA: Diagnosis not present

## 2016-04-09 DIAGNOSIS — I5032 Chronic diastolic (congestive) heart failure: Secondary | ICD-10-CM | POA: Insufficient documentation

## 2016-04-09 DIAGNOSIS — Z79899 Other long term (current) drug therapy: Secondary | ICD-10-CM | POA: Diagnosis not present

## 2016-04-09 DIAGNOSIS — E118 Type 2 diabetes mellitus with unspecified complications: Secondary | ICD-10-CM

## 2016-04-09 DIAGNOSIS — I252 Old myocardial infarction: Secondary | ICD-10-CM | POA: Insufficient documentation

## 2016-04-09 DIAGNOSIS — E1143 Type 2 diabetes mellitus with diabetic autonomic (poly)neuropathy: Secondary | ICD-10-CM | POA: Diagnosis not present

## 2016-04-09 DIAGNOSIS — Z794 Long term (current) use of insulin: Secondary | ICD-10-CM

## 2016-04-09 DIAGNOSIS — Z23 Encounter for immunization: Secondary | ICD-10-CM

## 2016-04-09 LAB — GLUCOSE, POCT (MANUAL RESULT ENTRY): POC Glucose: 206 mg/dl — AB (ref 70–99)

## 2016-04-09 MED ORDER — ONDANSETRON HCL 4 MG PO TABS: 4.0000 mg | ORAL_TABLET | Freq: Four times a day (QID) | ORAL | 3 refills | Status: DC | PRN

## 2016-04-09 MED ORDER — INSULIN LISPRO PROT & LISPRO (75-25 MIX) 100 UNIT/ML ~~LOC~~ SUSP: 50.0000 [IU] | Freq: Two times a day (BID) | SUBCUTANEOUS | 5 refills | Status: DC

## 2016-04-09 NOTE — Progress Notes (Signed)
C/C; hospital f/u  pt wants Zofran script written every 3 months .

## 2016-04-09 NOTE — Progress Notes (Signed)
Subjective:  Patient ID: [name], female    DOB: [birth year]  Age: 60 y.o. MRN: [id number]  CC: Hospitalization Follow-up   HPI [name] has DM2, HTN, hx of NSTEMI in 04/2012 treated with medical management, diastolic CHF she presents for   1. HFU acute chest pain: she was hospitalized from 7/9-7/11/17 for angina. She has known diastolic CHF and has followed up with her cardiologist since this hospitalization. She denies CP.   2. DM2: he is taking Humalog 75/25 65 U twice daily. She feels like her blood sugar is low at times. She is taking zofran for gastroparesis which helps with nausea.     Social History  Substance Use Topics  . Smoking status: Never Smoker  . Smokeless tobacco: Never Used  . Alcohol use No   Outpatient Medications Prior to Visit  Medication Sig Dispense Refill  . ACCU-CHEK SOFTCLIX LANCETS lancets Use as instructed for 3 times daily blood glucose monitoring 100 each 5  . acetaminophen (TYLENOL) 500 MG tablet Take 500 mg by mouth every 6 (six) hours as needed. For pain    . acetaZOLAMIDE (DIAMOX) 500 MG capsule Take 500 mg by mouth 2 (two) times daily.    Marland Kitchen aspirin 81 MG tablet Take 81 mg by mouth daily.    Marland Kitchen atorvastatin (LIPITOR) 80 MG tablet Take 1 tablet (80 mg total) by mouth daily at 6 PM. 90 tablet 3  . baclofen (LIORESAL) 10 MG tablet Take 1 tablet (10 mg total) by mouth at bedtime as needed for muscle spasms. 30 each 2  . bismuth subsalicylate (PEPTO BISMOL) 262 MG/15ML suspension Take 30 mLs by mouth every 6 (six) hours as needed for indigestion.     . Blood Glucose Monitoring Suppl (ACCU-CHEK AVIVA PLUS) w/Device KIT Used as directed 1 kit 0  . brimonidine (ALPHAGAN) 0.2 % ophthalmic solution Place 1 drop into both eyes every 12 (twelve) hours. 5 mL 12  . brimonidine-timolol (COMBIGAN) 0.2-0.5 % ophthalmic solution Place 1 drop into both eyes every 12 (twelve) hours.    . calcium carbonate (TUMS - DOSED IN MG ELEMENTAL CALCIUM) 500 MG  chewable tablet Chew 1 tablet by mouth 2 (two) times daily as needed for indigestion.     . clopidogrel (PLAVIX) 75 MG tablet Take 1 tablet (75 mg total) by mouth daily. KEEP OV. 90 tablet 0  . diltiazem (DILACOR XR) 240 MG 24 hr capsule TAKE ONE CAPSULE BY MOUTH ONCE DAILY 90 capsule 3  . ferrous sulfate 325 (65 FE) MG tablet Take 325 mg by mouth daily with breakfast.    . glucose blood test strip Use TID before meals / Dx E11.9 100 each 12  . glucose monitoring kit (FREESTYLE) monitoring kit 1 each by Does not apply route 4 (four) times daily - after meals and at bedtime. 1 each 1  . hydrocortisone cream 1 % Apply 1 application topically daily as needed for itching.    . insulin lispro protamine-lispro (HUMALOG MIX 75/25) (75-25) 100 UNIT/ML SUSP injection Inject 65 Units into the skin 2 (two) times daily with a meal. 40 mL 5  . Insulin Syringe-Needle U-100 (RELION INSULIN SYR 1CC/30G) 30G X 5/16" 1 ML MISC 1 each by Other route 2 (two) times daily. USE AS DIRECTED 200 each 3  . isosorbide mononitrate (IMDUR) 120 MG 24 hr tablet Take 1 tablet (120 mg total) by mouth daily. 30 tablet 1  . LUMIGAN 0.01 % SOLN Place 1 drop into both eyes  2 (two) times daily.    . Menthol-Methyl Salicylate (MUSCLE RUB EX) Apply 1 application topically daily as needed (pain).    . metoprolol (TOPROL-XL) 200 MG 24 hr tablet Take 1 tablet (200 mg total) by mouth daily. 90 tablet 3  . nitroGLYCERIN (NITROSTAT) 0.4 MG SL tablet DISSOLVE ONE TABLET UNDER THE TONGUE EVERY 5 MINUTES AS NEEDED FOR CHEST PAIN.  DO NOT EXCEED A TOTAL OF 3 DOSES IN 15 MINUTES 25 tablet 5  . ondansetron (ZOFRAN) 4 MG tablet Take 1 tablet (4 mg total) by mouth every 6 (six) hours as needed for nausea. 30 tablet 2  . pantoprazole (PROTONIX) 40 MG tablet TAKE ONE TABLET BY MOUTH ONCE DAILY 30 tablet 2  . polyethylene glycol (MIRALAX / GLYCOLAX) packet Take 17 g by mouth daily as needed for mild constipation.    . polyvinyl alcohol (LIQUIFILM TEARS)  1.4 % ophthalmic solution Place 1 drop into both eyes daily as needed. For dry eyes    . ranolazine (RANEXA) 500 MG 12 hr tablet Take 1 tablet (500 mg total) by mouth 2 (two) times daily. 180 tablet 3  . RELION INSULIN SYR 1CC/30G 30G X 5/16" 1 ML MISC USE AS DIRECTED 100 each 2  . timolol (TIMOPTIC) 0.5 % ophthalmic solution Place 1 drop into both eyes every 12 (twelve) hours. 10 mL 12  . furosemide (LASIX) 80 MG tablet Take 1 tablet (80 mg total) by mouth daily. 90 tablet 3   No facility-administered medications prior to visit.     ROS Review of Systems  Constitutional: Negative for chills and fever.  Eyes: Negative for visual disturbance.  Respiratory: Negative for shortness of breath.   Cardiovascular: Negative for chest pain.  Gastrointestinal: Positive for constipation (improved with prune ). Negative for abdominal pain and blood in stool.  Musculoskeletal: Negative for arthralgias and back pain.  Skin: Negative for rash.  Allergic/Immunologic: Negative for immunocompromised state.  Neurological: Positive for dizziness.  Hematological: Negative for adenopathy. Does not bruise/bleed easily.  Psychiatric/Behavioral: Negative for dysphoric mood and suicidal ideas.    Objective:  BP (!) 144/84 (BP Location: Left Arm, Patient Position: Sitting, Cuff Size: Large)   Pulse 70   Temp 98.3 F (36.8 C) (Oral)   Wt 244 lb 3.2 oz (110.8 kg)   LMP 04/02/2012   SpO2 98%   BMI 43.26 kg/m   BP/Weight 04/09/2016 03/19/2016 2/91/9166  Systolic BP 060 045 997  Diastolic BP 84 70 61  Wt. (Lbs) 244.2 275.8 272.1  BMI 43.26 48.86 48.21   Wt Readings from Last 3 Encounters:  04/09/16 244 lb 3.2 oz (110.8 kg)  03/19/16 275 lb 12.8 oz (125.1 kg)  02/10/16 272 lb 1.6 oz (123.4 kg)    Physical Exam  Constitutional: She is oriented to person, place, and time. She appears well-developed and well-nourished. No distress.  Obese   HENT:  Head: Normocephalic and atraumatic.  Cardiovascular:  Normal rate, regular rhythm, normal heart sounds and intact distal pulses.   Pulmonary/Chest: Effort normal and breath sounds normal.  Musculoskeletal: She exhibits no edema.  Neurological: She is alert and oriented to person, place, and time.  Skin: Skin is warm and dry. No rash noted.  Psychiatric: She has a normal mood and affect.    Lab Results  Component Value Date   HGBA1C 6.1 (H) 02/08/2016   CBG 206 Assessment & Plan:  Vallery was seen today for hospitalization follow-up.  Diagnoses and all orders for this visit:  Controlled type  2 diabetes mellitus with complication, with long-term current use of insulin (HCC) -     POCT glucose (manual entry) -     insulin lispro protamine-lispro (HUMALOG MIX 75/25) (75-25) 100 UNIT/ML SUSP injection; Inject 50 Units into the skin 2 (two) times daily with a meal.  Diabetic gastroparesis associated with type 2 diabetes mellitus (HCC) -     ondansetron (ZOFRAN) 4 MG tablet; Take 1 tablet (4 mg total) by mouth every 6 (six) hours as needed for nausea.  Encounter for immunization -     Flu Vaccine QUAD 36+ mos IM   Follow-up: Return in about 5 months (around 09/09/2016) for diabetes .   Boykin Nearing MD

## 2016-04-09 NOTE — Patient Instructions (Addendum)
[name] was seen today for hospitalization follow-up.  Diagnoses and all orders for this visit:  Controlled type 2 diabetes mellitus with complication, with long-term current use of insulin (HCC) -     POCT glucose (manual entry) -     insulin lispro protamine-lispro (HUMALOG MIX 75/25) (75-25) 100 UNIT/ML SUSP injection; Inject 50 Units into the skin 2 (two) times daily with a meal.  Diabetic gastroparesis associated with type 2 diabetes mellitus (HCC) -     ondansetron (ZOFRAN) 4 MG tablet; Take 1 tablet (4 mg total) by mouth every 6 (six) hours as needed for nausea.   F/u in 5 months for diabetes check   Dr. Adrian Blackwater

## 2016-04-11 NOTE — Assessment & Plan Note (Addendum)
Controlled diabetes Continue humalog 75/25. Decrease dose to 50 U BID to prevent hypoglycemia

## 2016-04-14 ENCOUNTER — Other Ambulatory Visit: Payer: Self-pay | Admitting: Internal Medicine

## 2016-04-16 ENCOUNTER — Other Ambulatory Visit: Payer: Self-pay

## 2016-04-16 MED ORDER — ISOSORBIDE MONONITRATE ER 120 MG PO TB24: 120.0000 mg | ORAL_TABLET | Freq: Every day | ORAL | 5 refills | Status: DC

## 2016-04-20 ENCOUNTER — Telehealth: Payer: Self-pay | Admitting: *Deleted

## 2016-04-20 NOTE — Telephone Encounter (Signed)
Patient left a msg on the refill vm stating that the pharmacy still does not have her rx for isosorbide. Per epic, this was received by the pharmacy on 04/16/16. She would like a call back at [phone]. Thanks, MI

## 2016-05-18 ENCOUNTER — Other Ambulatory Visit: Payer: Self-pay | Admitting: Cardiovascular Disease

## 2016-06-07 ENCOUNTER — Other Ambulatory Visit: Payer: Self-pay | Admitting: Physician Assistant

## 2016-06-07 ENCOUNTER — Other Ambulatory Visit: Payer: Self-pay | Admitting: Pharmacist

## 2016-06-07 DIAGNOSIS — M62838 Other muscle spasm: Secondary | ICD-10-CM

## 2016-06-07 MED ORDER — BACLOFEN 10 MG PO TABS: 10.0000 mg | ORAL_TABLET | Freq: Every evening | ORAL | 0 refills | Status: DC | PRN

## 2016-06-07 NOTE — Telephone Encounter (Signed)
Rx request sent to pharmacy.  

## 2016-06-30 ENCOUNTER — Other Ambulatory Visit: Payer: Self-pay | Admitting: *Deleted

## 2016-06-30 DIAGNOSIS — M62838 Other muscle spasm: Secondary | ICD-10-CM

## 2016-06-30 NOTE — Telephone Encounter (Signed)
Refill request received for Baclofen, she has not been seen since September. Would you like this refilled.

## 2016-07-01 MED ORDER — BACLOFEN 10 MG PO TABS: 10.0000 mg | ORAL_TABLET | Freq: Every evening | ORAL | 0 refills | Status: DC | PRN

## 2016-07-29 ENCOUNTER — Other Ambulatory Visit: Payer: Self-pay | Admitting: Internal Medicine

## 2016-08-24 ENCOUNTER — Other Ambulatory Visit: Payer: Self-pay | Admitting: Cardiovascular Disease

## 2016-08-24 DIAGNOSIS — N183 Chronic kidney disease, stage 3 unspecified: Secondary | ICD-10-CM

## 2016-08-24 DIAGNOSIS — I5032 Chronic diastolic (congestive) heart failure: Secondary | ICD-10-CM

## 2016-08-24 NOTE — Telephone Encounter (Signed)
REFILL 

## 2016-08-31 ENCOUNTER — Other Ambulatory Visit: Payer: Self-pay | Admitting: Cardiovascular Disease

## 2016-08-31 NOTE — Telephone Encounter (Signed)
Rx(s) sent to pharmacy electronically.  

## 2016-09-02 ENCOUNTER — Other Ambulatory Visit: Payer: Self-pay

## 2016-09-02 MED ORDER — RANOLAZINE ER 500 MG PO TB12: 500.0000 mg | ORAL_TABLET | Freq: Two times a day (BID) | ORAL | 2 refills | Status: DC

## 2016-09-09 ENCOUNTER — Ambulatory Visit (HOSPITAL_COMMUNITY)
Admission: RE | Admit: 2016-09-09 | Discharge: 2016-09-09 | Disposition: A | Discharge status: Home / Self Care | Payer: Medicare Other | Source: Ambulatory Visit | Attending: Physician Assistant | Admitting: Physician Assistant

## 2016-09-09 ENCOUNTER — Ambulatory Visit: Payer: Medicare Other | Attending: Physician Assistant | Admitting: Physician Assistant

## 2016-09-09 ENCOUNTER — Telehealth: Payer: Self-pay | Admitting: *Deleted

## 2016-09-09 VITALS — BP 165/85 | HR 85 | Temp 99.6°F | Resp 24 | Wt 283.0 lb

## 2016-09-09 DIAGNOSIS — Z760 Encounter for issue of repeat prescription: Secondary | ICD-10-CM | POA: Diagnosis not present

## 2016-09-09 DIAGNOSIS — I5032 Chronic diastolic (congestive) heart failure: Secondary | ICD-10-CM | POA: Diagnosis not present

## 2016-09-09 DIAGNOSIS — Z79899 Other long term (current) drug therapy: Secondary | ICD-10-CM | POA: Insufficient documentation

## 2016-09-09 DIAGNOSIS — Z7982 Long term (current) use of aspirin: Secondary | ICD-10-CM | POA: Insufficient documentation

## 2016-09-09 DIAGNOSIS — I2510 Atherosclerotic heart disease of native coronary artery without angina pectoris: Secondary | ICD-10-CM | POA: Diagnosis not present

## 2016-09-09 DIAGNOSIS — K219 Gastro-esophageal reflux disease without esophagitis: Secondary | ICD-10-CM | POA: Diagnosis not present

## 2016-09-09 DIAGNOSIS — E11319 Type 2 diabetes mellitus with unspecified diabetic retinopathy without macular edema: Secondary | ICD-10-CM | POA: Insufficient documentation

## 2016-09-09 DIAGNOSIS — N289 Disorder of kidney and ureter, unspecified: Secondary | ICD-10-CM | POA: Insufficient documentation

## 2016-09-09 DIAGNOSIS — E785 Hyperlipidemia, unspecified: Secondary | ICD-10-CM | POA: Diagnosis not present

## 2016-09-09 DIAGNOSIS — G8929 Other chronic pain: Secondary | ICD-10-CM | POA: Insufficient documentation

## 2016-09-09 DIAGNOSIS — I110 Hypertensive heart disease with heart failure: Secondary | ICD-10-CM | POA: Diagnosis not present

## 2016-09-09 DIAGNOSIS — Z794 Long term (current) use of insulin: Secondary | ICD-10-CM

## 2016-09-09 DIAGNOSIS — Z6841 Body Mass Index (BMI) 40.0 and over, adult: Secondary | ICD-10-CM | POA: Insufficient documentation

## 2016-09-09 DIAGNOSIS — R05 Cough: Secondary | ICD-10-CM | POA: Insufficient documentation

## 2016-09-09 DIAGNOSIS — E118 Type 2 diabetes mellitus with unspecified complications: Secondary | ICD-10-CM

## 2016-09-09 DIAGNOSIS — R059 Cough, unspecified: Secondary | ICD-10-CM

## 2016-09-09 DIAGNOSIS — J069 Acute upper respiratory infection, unspecified: Secondary | ICD-10-CM | POA: Insufficient documentation

## 2016-09-09 DIAGNOSIS — R918 Other nonspecific abnormal finding of lung field: Secondary | ICD-10-CM | POA: Diagnosis not present

## 2016-09-09 DIAGNOSIS — G4733 Obstructive sleep apnea (adult) (pediatric): Secondary | ICD-10-CM | POA: Insufficient documentation

## 2016-09-09 DIAGNOSIS — H5461 Unqualified visual loss, right eye, normal vision left eye: Secondary | ICD-10-CM | POA: Diagnosis not present

## 2016-09-09 DIAGNOSIS — I252 Old myocardial infarction: Secondary | ICD-10-CM | POA: Insufficient documentation

## 2016-09-09 LAB — GLUCOSE, POCT (MANUAL RESULT ENTRY): POC GLUCOSE: 208 mg/dL — AB (ref 70–99)

## 2016-09-09 MED ORDER — GUAIFENESIN ER 1200 MG PO TB12: 1.0000 | ORAL_TABLET | Freq: Two times a day (BID) | ORAL | 0 refills | Status: AC

## 2016-09-09 MED ORDER — LEVOFLOXACIN 750 MG PO TABS: 750.0000 mg | ORAL_TABLET | Freq: Every day | ORAL | 0 refills | Status: DC

## 2016-09-09 MED ORDER — FUROSEMIDE 80 MG PO TABS: 80.0000 mg | ORAL_TABLET | Freq: Every day | ORAL | 0 refills | Status: DC

## 2016-09-09 MED ORDER — ACETAMINOPHEN 500 MG PO TABS: 500.0000 mg | ORAL_TABLET | Freq: Four times a day (QID) | ORAL | 0 refills | Status: DC | PRN

## 2016-09-09 MED ORDER — BENZONATATE 200 MG PO CAPS: 200.0000 mg | ORAL_CAPSULE | Freq: Two times a day (BID) | ORAL | 0 refills | Status: DC | PRN

## 2016-09-09 NOTE — Progress Notes (Signed)
Cough, wheezingSOB Out of lasix for weeks

## 2016-09-09 NOTE — Progress Notes (Signed)
Patient ID: [name], female   DOB: Jan 06, 1956, 61 y.o.   MRN: [id number]    Subjective:  Patient ID: [name], female    DOB: Jul 14, 1956  Age: 61 y.o. MRN: [id number]  CC: Cough  HPI [name] is a 61 y.o. female with a PMH of   Past Medical History:  Diagnosis Date  . Anemia   . Blind right eye   . Chronic back pain    "my whole back" (03/18/2015)  . Chronic diastolic heart failure ([place]) 9/13   echo 03/20/15 LV Ef of 60-65%, grade 2DD and PA peak pressure: 36 mm Hg   . Coronary artery disease    Medical Rx  . Diabetic retinopathy ([place])   . GERD (gastroesophageal reflux disease)   . Headache   . History of blood transfusion "several"   "related to menopause"  . Hyperlipemia   . Hypertension   . Morbid obesity with BMI of 40.0-44.9, adult ([place])   . Myocardial infarction "several"  . 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 Obstructive sleep apnea    "they say I do; I say I don't; no mask" (03/18/2015)  . Renal insufficiency   . Type II diabetes mellitus (HCC)    insulin dependent    that presents with a 4 day history of Cough. Possibly contracted from family member. Has taken OTC remedies with little to no relief. Associated chest tightness, shortness of breath, sore throat, and headache with coughing. Denies chest pain, abdominal pain, nausea, vomiting, rash, or GI/GU symptoms.    Outpatient Medications Prior to Visit  Medication Sig Dispense Refill  . acetaZOLAMIDE (DIAMOX) 500 MG capsule Take 500 mg by mouth 2 (two) times daily.    Marland Kitchen aspirin 81 MG tablet Take 81 mg by mouth daily.    Marland Kitchen atorvastatin (LIPITOR) 80 MG tablet TAKE ONE TABLET BY MOUTH IN THE EVENING AT  6PM 90 tablet 3  . baclofen (LIORESAL) 10 MG tablet Take 1 tablet (10 mg total) by mouth at bedtime as needed for muscle spasms. 30 each 0  . bismuth subsalicylate (PEPTO BISMOL) 262 MG/15ML suspension Take 30 mLs by mouth every 6 (six) hours as needed for indigestion.     .  Blood Glucose Monitoring Suppl (ACCU-CHEK AVIVA PLUS) w/Device KIT Used as directed 1 kit 0  . brimonidine (ALPHAGAN) 0.2 % ophthalmic solution Place 1 drop into both eyes every 12 (twelve) hours. 5 mL 12  . brimonidine-timolol (COMBIGAN) 0.2-0.5 % ophthalmic solution Place 1 drop into both eyes every 12 (twelve) hours.    . clopidogrel (PLAVIX) 75 MG tablet Take 1 tablet (75 mg total) by mouth daily. 90 tablet 2  . diltiazem (DILACOR XR) 240 MG 24 hr capsule TAKE ONE CAPSULE BY MOUTH ONCE DAILY 90 capsule 3  . ferrous sulfate 325 (65 FE) MG tablet Take 325 mg by mouth daily with breakfast.    . glucose blood test strip Use TID before meals / Dx E11.9 100 each 12  . glucose monitoring kit (FREESTYLE) monitoring kit 1 each by Does not apply route 4 (four) times daily - after meals and at bedtime. 1 each 1  . hydrocortisone cream 1 % Apply 1 application topically daily as needed for itching.    . insulin lispro protamine-lispro (HUMALOG MIX 75/25) (75-25) 100 UNIT/ML SUSP injection Inject 50 Units into the skin 2 (two) times daily with a meal. 40 mL 5  . Insulin Syringe-Needle U-100 (  RELION INSULIN SYR 1CC/30G) 30G X 5/16" 1 ML MISC 1 each by Other route 2 (two) times daily. USE AS DIRECTED 200 each 3  . isosorbide mononitrate (IMDUR) 120 MG 24 hr tablet Take 1 tablet (120 mg total) by mouth daily. 30 tablet 5  . LUMIGAN 0.01 % SOLN Place 1 drop into both eyes 2 (two) times daily.    . Menthol-Methyl Salicylate (MUSCLE RUB EX) Apply 1 application topically daily as needed (pain).    . metoprolol (TOPROL-XL) 200 MG 24 hr tablet TAKE ONE TABLET BY MOUTH ONCE DAILY 90 tablet 0  . nitroGLYCERIN (NITROSTAT) 0.4 MG SL tablet DISSOLVE ONE TABLET UNDER THE TONGUE EVERY 5 MINUTES AS NEEDED FOR CHEST PAIN.  DO NOT EXCEED A TOTAL OF 3 DOSES IN 15 MINUTES 25 tablet 5  . ondansetron (ZOFRAN) 4 MG tablet Take 1 tablet (4 mg total) by mouth every 6 (six) hours as needed for nausea. 50 tablet 3  . pantoprazole  (PROTONIX) 40 MG tablet TAKE ONE TABLET BY MOUTH ONCE DAILY 30 tablet 2  . polyvinyl alcohol (LIQUIFILM TEARS) 1.4 % ophthalmic solution Place 1 drop into both eyes daily as needed. For dry eyes    . ranolazine (RANEXA) 500 MG 12 hr tablet Take 1 tablet (500 mg total) by mouth 2 (two) times daily. 60 tablet 2  . timolol (TIMOPTIC) 0.5 % ophthalmic solution Place 1 drop into both eyes every 12 (twelve) hours. 10 mL 12  . acetaminophen (TYLENOL) 500 MG tablet Take 500 mg by mouth every 6 (six) hours as needed. For pain    . furosemide (LASIX) 80 MG tablet TAKE ONE TABLET BY MOUTH ONCE DAILY 90 tablet 0  . ACCU-CHEK SOFTCLIX LANCETS lancets Use as instructed for 3 times daily blood glucose monitoring 100 each 5  . calcium carbonate (TUMS - DOSED IN MG ELEMENTAL CALCIUM) 500 MG chewable tablet Chew 1 tablet by mouth 2 (two) times daily as needed for indigestion.     . polyethylene glycol (MIRALAX / GLYCOLAX) packet Take 17 g by mouth daily as needed for mild constipation.    Marland Kitchen RELION INSULIN SYR 1CC/30G 30G X 5/16" 1 ML MISC USE AS DIRECTED (Patient not taking: Reported on 09/09/2016) 100 each 2   No facility-administered medications prior to visit.      ROS Review of Systems  Constitutional: Positive for malaise/fatigue. Negative for chills and fever.  HENT: Positive for sore throat. Negative for ear pain and sinus pain.   Eyes: Negative for blurred vision and pain.  Respiratory: Positive for cough and shortness of breath.   Cardiovascular: Negative for chest pain and palpitations.  Gastrointestinal: Negative for abdominal pain and nausea.  Genitourinary: Negative for dysuria and hematuria.  Musculoskeletal: Negative for joint pain and myalgias.  Skin: Negative for rash.  Neurological: Positive for headaches. Negative for tingling.  Psychiatric/Behavioral: Negative for depression. The patient is not nervous/anxious.     Objective:  BP (!) 165/85   Pulse 85   Temp 99.6 F (37.6 C) (Oral)    Resp (!) 24   Wt 283 lb (128.4 kg)   LMP 04/02/2012   SpO2 100%   BMI 50.13 kg/m   BP/Weight 09/09/2016 04/09/2016 7/86/7672  Systolic BP 094 709 628  Diastolic BP 85 84 70  Wt. (Lbs) 283 244.2 275.8  BMI 50.13 43.26 48.86      Physical Exam  Constitutional: She is oriented to person, place, and time.  NAD, obese, polite, not toxic or ill appearing  HENT:  Head: Normocephalic and atraumatic.  Eyes: Conjunctivae are normal. No scleral icterus.  Neck: Normal range of motion. Neck supple.  Cardiovascular: Normal rate, regular rhythm and normal heart sounds.   Pulmonary/Chest: Effort normal.  Mild rhonchi heard diffusely bilaterally  Musculoskeletal: She exhibits no edema.  Lymphadenopathy:    She has cervical adenopathy (Mild anterior cervical lymphadenopathy).  Neurological: She is alert and oriented to person, place, and time.  Skin: Skin is warm and dry. No rash noted. No erythema.  Psychiatric: She has a normal mood and affect. Her behavior is normal.     Assessment & Plan:   1. Cough Felt to be caused by bronchitis but patient has multiple comorbidities and it would be prudent to cover for bacterial pneumonia. - DG Chest 2 View; Future - Influenza A and B Ag, Immunoassay  2. Upper respiratory tract infection, unspecified type -mild sinus congestion  3. Chronic diastolic heart failure, NYHA class 3 (HCC) -medication refill only, has been out of Lasix 15m several weeks. - furosemide (LASIX) 80 MG tablet; Take 1 tablet (80 mg total) by mouth daily.  Dispense: 30 tablet; Refill: 0  4. Controlled type 2 diabetes mellitus with complication, with long-term current use of insulin (HCC) - Glucose (CBG) not collected. Epic autopopulated diagnoses and orders without my consent. No DM counseling or treatment given today. Epic will not let me remove this diagnosis.   Meds ordered this encounter  Medications  . levofloxacin (LEVAQUIN) 750 MG tablet    Sig: Take 1 tablet  (750 mg total) by mouth daily.    Dispense:  5 tablet    Refill:  0    Order Specific Question:   Supervising Provider    Answer:   JTresa Garter[[id number] . benzonatate (TESSALON) 200 MG capsule    Sig: Take 1 capsule (200 mg total) by mouth 2 (two) times daily as needed for cough.    Dispense:  20 capsule    Refill:  0    Order Specific Question:   Supervising Provider    Answer:   JTresa Garter[[id number] . Guaifenesin (MUCINEX MAXIMUM STRENGTH) 1200 MG TB12    Sig: Take 1 tablet (1,200 mg total) by mouth 2 (two) times daily.    Dispense:  10 tablet    Refill:  0    Order Specific Question:   Supervising Provider    Answer:   JTresa Garter[[id number] . acetaminophen (TYLENOL) 500 MG tablet    Sig: Take 1 tablet (500 mg total) by mouth every 6 (six) hours as needed.    Dispense:  30 tablet    Refill:  0    Order Specific Question:   Supervising Provider    Answer:   JTresa Garter[[id number] . furosemide (LASIX) 80 MG tablet    Sig: Take 1 tablet (80 mg total) by mouth daily.    Dispense:  30 tablet    Refill:  0    Order Specific Question:   Supervising Provider    Answer:   JTresa Garter[[id number]   Follow-up: Return if symptoms worsen or fail to improve.   RClent DemarkPA

## 2016-09-09 NOTE — Telephone Encounter (Signed)
Stacy with [facility] gave verbal report on chest x-ray. Right lung nodule, not previously seen. Suggest Chest CT. Will inform ordering provider.

## 2016-09-09 NOTE — Patient Instructions (Signed)
Cough, Adult Coughing is a reflex that clears your throat and your airways. Coughing helps to heal and protect your lungs. It is normal to cough occasionally, but a cough that happens with other symptoms or lasts a long time may be a sign of a condition that needs treatment. A cough may last only 2-3 weeks (acute), or it may last longer than 8 weeks (chronic). What are the causes? Coughing is commonly caused by:  Breathing in substances that irritate your lungs.  A viral or bacterial respiratory infection.  Allergies.  Asthma.  Postnasal drip.  Smoking.  Acid backing up from the stomach into the esophagus (gastroesophageal reflux).  Certain medicines.  Chronic lung problems, including COPD (or rarely, lung cancer).  Other medical conditions such as heart failure.  Follow these instructions at home: Pay attention to any changes in your symptoms. Take these actions to help with your discomfort:  Take medicines only as told by your health care provider. ? If you were prescribed an antibiotic medicine, take it as told by your health care provider. Do not stop taking the antibiotic even if you start to feel better. ? Talk with your health care provider before you take a cough suppressant medicine.  Drink enough fluid to keep your urine clear or pale yellow.  If the air is dry, use a cold steam vaporizer or humidifier in your bedroom or your home to help loosen secretions.  Avoid anything that causes you to cough at work or at home.  If your cough is worse at night, try sleeping in a semi-upright position.  Avoid cigarette smoke. If you smoke, quit smoking. If you need help quitting, ask your health care provider.  Avoid caffeine.  Avoid alcohol.  Rest as needed.  Contact a health care provider if:  You have new symptoms.  You cough up pus.  Your cough does not get better after 2-3 weeks, or your cough gets worse.  You cannot control your cough with suppressant  medicines and you are losing sleep.  You develop pain that is getting worse or pain that is not controlled with pain medicines.  You have a fever.  You have unexplained weight loss.  You have night sweats. Get help right away if:  You cough up blood.  You have difficulty breathing.  Your heartbeat is very fast. This information is not intended to replace advice given to you by your health care provider. Make sure you discuss any questions you have with your health care provider. Document Released: 01/15/2011 Document Revised: 12/25/2015 Document Reviewed: 09/25/2014 Elsevier Interactive Patient Education  2017 [employer]  

## 2016-09-10 ENCOUNTER — Encounter: Payer: Self-pay | Admitting: Physician Assistant

## 2016-09-10 LAB — INFLUENZA A AND B AG, IMMUNOASSAY
Influenza A Antigen: NOT DETECTED
Influenza B Antigen: NOT DETECTED

## 2016-09-10 NOTE — Progress Notes (Signed)
Please notify patient and tell her flu is negative. Thanks.

## 2016-09-14 ENCOUNTER — Other Ambulatory Visit: Payer: Self-pay

## 2016-09-14 ENCOUNTER — Ambulatory Visit: Payer: Medicare Other | Attending: Family Medicine | Admitting: Family Medicine

## 2016-09-14 VITALS — BP 177/84 | HR 71 | Temp 98.2°F | Ht 63.0 in | Wt 277.8 lb

## 2016-09-14 DIAGNOSIS — E118 Type 2 diabetes mellitus with unspecified complications: Secondary | ICD-10-CM | POA: Diagnosis not present

## 2016-09-14 DIAGNOSIS — Z7982 Long term (current) use of aspirin: Secondary | ICD-10-CM | POA: Diagnosis not present

## 2016-09-14 DIAGNOSIS — I10 Essential (primary) hypertension: Secondary | ICD-10-CM

## 2016-09-14 DIAGNOSIS — R911 Solitary pulmonary nodule: Secondary | ICD-10-CM | POA: Diagnosis not present

## 2016-09-14 DIAGNOSIS — R51 Headache: Secondary | ICD-10-CM | POA: Diagnosis not present

## 2016-09-14 DIAGNOSIS — E1143 Type 2 diabetes mellitus with diabetic autonomic (poly)neuropathy: Secondary | ICD-10-CM | POA: Diagnosis not present

## 2016-09-14 DIAGNOSIS — Z794 Long term (current) use of insulin: Secondary | ICD-10-CM

## 2016-09-14 DIAGNOSIS — R42 Dizziness and giddiness: Secondary | ICD-10-CM | POA: Diagnosis not present

## 2016-09-14 DIAGNOSIS — K3184 Gastroparesis: Secondary | ICD-10-CM | POA: Diagnosis not present

## 2016-09-14 DIAGNOSIS — I5032 Chronic diastolic (congestive) heart failure: Secondary | ICD-10-CM

## 2016-09-14 DIAGNOSIS — I2510 Atherosclerotic heart disease of native coronary artery without angina pectoris: Secondary | ICD-10-CM | POA: Diagnosis not present

## 2016-09-14 DIAGNOSIS — I110 Hypertensive heart disease with heart failure: Secondary | ICD-10-CM | POA: Insufficient documentation

## 2016-09-14 DIAGNOSIS — I5033 Acute on chronic diastolic (congestive) heart failure: Secondary | ICD-10-CM

## 2016-09-14 DIAGNOSIS — M7989 Other specified soft tissue disorders: Secondary | ICD-10-CM | POA: Insufficient documentation

## 2016-09-14 DIAGNOSIS — R05 Cough: Secondary | ICD-10-CM | POA: Diagnosis not present

## 2016-09-14 DIAGNOSIS — R079 Chest pain, unspecified: Secondary | ICD-10-CM | POA: Insufficient documentation

## 2016-09-14 DIAGNOSIS — R0602 Shortness of breath: Secondary | ICD-10-CM | POA: Insufficient documentation

## 2016-09-14 DIAGNOSIS — R059 Cough, unspecified: Secondary | ICD-10-CM | POA: Insufficient documentation

## 2016-09-14 LAB — CBC
HCT: 42.5 % (ref 35.0–45.0)
Hemoglobin: 14 g/dL (ref 11.7–15.5)
MCH: 31.3 pg (ref 27.0–33.0)
MCHC: 32.9 g/dL (ref 32.0–36.0)
MCV: 94.9 fL (ref 80.0–100.0)
MPV: 10.3 fL (ref 7.5–12.5)
PLATELETS: 252 10*3/uL (ref 140–400)
RBC: 4.48 MIL/uL (ref 3.80–5.10)
RDW: 13.4 % (ref 11.0–15.0)
WBC: 9.7 10*3/uL (ref 3.8–10.8)

## 2016-09-14 LAB — COMPLETE METABOLIC PANEL WITH GFR
ALT: 13 U/L (ref 6–29)
AST: 18 U/L (ref 10–35)
Albumin: 3.6 g/dL (ref 3.6–5.1)
Alkaline Phosphatase: 52 U/L (ref 33–130)
BILIRUBIN TOTAL: 0.5 mg/dL (ref 0.2–1.2)
BUN: 6 mg/dL — ABNORMAL LOW (ref 7–25)
CALCIUM: 9 mg/dL (ref 8.6–10.4)
CO2: 24 mmol/L (ref 20–31)
CREATININE: 1.18 mg/dL — AB (ref 0.50–0.99)
Chloride: 108 mmol/L (ref 98–110)
GFR, EST AFRICAN AMERICAN: 58 mL/min — AB (ref >=60)
GFR, Est Non African American: 50 mL/min — ABNORMAL LOW (ref >=60)
Glucose, Bld: 137 mg/dL — ABNORMAL HIGH (ref 65–99)
Potassium: 3.6 mmol/L (ref 3.5–5.3)
Sodium: 141 mmol/L (ref 135–146)
TOTAL PROTEIN: 7 g/dL (ref 6.1–8.1)

## 2016-09-14 LAB — POCT GLYCOSYLATED HEMOGLOBIN (HGB A1C): Hemoglobin A1C: 6.4

## 2016-09-14 LAB — GLUCOSE, POCT (MANUAL RESULT ENTRY): POC Glucose: 160 mg/dl — AB (ref 70–99)

## 2016-09-14 MED ORDER — AMOXICILLIN 875 MG PO TABS: 875.0000 mg | ORAL_TABLET | Freq: Two times a day (BID) | ORAL | 0 refills | Status: DC

## 2016-09-14 MED ORDER — FUROSEMIDE 80 MG PO TABS: 80.0000 mg | ORAL_TABLET | Freq: Two times a day (BID) | ORAL | 0 refills | Status: DC

## 2016-09-14 MED ORDER — POTASSIUM CHLORIDE CRYS ER 20 MEQ PO TBCR: 40.0000 meq | EXTENDED_RELEASE_TABLET | Freq: Every day | ORAL | 0 refills | Status: DC

## 2016-09-14 MED ORDER — PROMETHAZINE-DM 6.25-15 MG/5ML PO SYRP: 5.0000 mL | ORAL_SOLUTION | Freq: Four times a day (QID) | ORAL | 0 refills | Status: DC | PRN

## 2016-09-14 NOTE — Assessment & Plan Note (Addendum)
CT chest 09/20/2016 confirms small nodule noted on CXR that is 6 mm in right upper lung There is a second nodule in left lower lung 4 mm No evidence of cancer or infection F/u CT in 6-12 months recommended and will be obtained

## 2016-09-14 NOTE — Patient Instructions (Addendum)
Ottilia was seen today for diabetes.  Diagnoses and all orders for this visit:  Essential hypertension -     COMPLETE METABOLIC PANEL WITH GFR -     CBC  Incidental lung nodule, > 46mm and < 7mm -     CT Chest Wo Contrast; Future  Controlled type 2 diabetes mellitus with complication, with long-term current use of insulin (HCC) -     POCT glucose (manual entry) -     POCT glycosylated hemoglobin (Hb A1C)  Cough -     Brain natriuretic peptide -     EKG 12-Lead -     amoxicillin (AMOXIL) 875 MG tablet; Take 1 tablet (875 mg total) by mouth 2 (two) times daily. -     promethazine-dextromethorphan (PROMETHAZINE-DM) 6.25-15 MG/5ML syrup; Take 5 mLs by mouth 4 (four) times daily as needed for cough.  Chronic diastolic heart failure, NYHA class 3 (HCC) -     furosemide (LASIX) 80 MG tablet; Take 1 tablet (80 mg total) by mouth 2 (two) times daily. -     potassium chloride SA (K-DUR,KLOR-CON) 20 MEQ tablet; Take 2 tablets (40 mEq total) by mouth daily.   The EKG today is unchanged from the previous one  Increase lasix to 80 mg twice daily Add potassium once daily to prevent low potassium with lasix  Take amoxicillin 875 mg twice daily for 10 days  Promethazine- DM for cough  Keep legs elevated  If you dizziness, chest pains or shortness of breath worsen at home, call [phone]  You will be called with CT chest appointment to evaluate pulmonary nodule   F/u in 2 weeks for cough  Dr. Adrian Blackwater

## 2016-09-14 NOTE — Assessment & Plan Note (Signed)
Cough primarily due to diastolic CHF exacerbation likely also due to acute sinusitis Plan: amoxicillin Promethazine-DM for cough

## 2016-09-14 NOTE — Progress Notes (Signed)
Subjective:  Patient ID: [name], female    DOB: 03-Dec-1955  Age: 61 y.o. MRN: [id number]  CC: Diabetes   HPI [name] has CHF,CAD, HTN and diabetes presents for   1. Cough: x 6 weeks. Worsened 2 weeks ago. Treated for bronchitis with levaquin, tessalon perles. She was without lasix and this was restarted. CXR with negative for acute infiltrate or process. There was R mid lung nodule noted. She continues to have cough, headache, congestion, SOB, chest pain, orthopnea. She has dizziness when standing and fatigue. No fever. She has leg swelling.   3. R mid lung nodule: 6 mm x 6 mm R mid lung nodule on CXR. F/u CT chest recommended.   Social History  Substance Use Topics  . Smoking status: Never Smoker  . Smokeless tobacco: Never Used  . Alcohol use No    Outpatient Medications Prior to Visit  Medication Sig Dispense Refill  . ACCU-CHEK SOFTCLIX LANCETS lancets Use as instructed for 3 times daily blood glucose monitoring 100 each 5  . acetaminophen (TYLENOL) 500 MG tablet Take 1 tablet (500 mg total) by mouth every 6 (six) hours as needed. 30 tablet 0  . acetaZOLAMIDE (DIAMOX) 500 MG capsule Take 500 mg by mouth 2 (two) times daily.    Marland Kitchen aspirin 81 MG tablet Take 81 mg by mouth daily.    Marland Kitchen atorvastatin (LIPITOR) 80 MG tablet TAKE ONE TABLET BY MOUTH IN THE EVENING AT  6PM 90 tablet 3  . baclofen (LIORESAL) 10 MG tablet Take 1 tablet (10 mg total) by mouth at bedtime as needed for muscle spasms. 30 each 0  . benzonatate (TESSALON) 200 MG capsule Take 1 capsule (200 mg total) by mouth 2 (two) times daily as needed for cough. 20 capsule 0  . bismuth subsalicylate (PEPTO BISMOL) 262 MG/15ML suspension Take 30 mLs by mouth every 6 (six) hours as needed for indigestion.     . Blood Glucose Monitoring Suppl (ACCU-CHEK AVIVA PLUS) w/Device KIT Used as directed 1 kit 0  . brimonidine (ALPHAGAN) 0.2 % ophthalmic solution Place 1 drop into both eyes every 12 (twelve) hours. 5 mL  12  . brimonidine-timolol (COMBIGAN) 0.2-0.5 % ophthalmic solution Place 1 drop into both eyes every 12 (twelve) hours.    . calcium carbonate (TUMS - DOSED IN MG ELEMENTAL CALCIUM) 500 MG chewable tablet Chew 1 tablet by mouth 2 (two) times daily as needed for indigestion.     . clopidogrel (PLAVIX) 75 MG tablet Take 1 tablet (75 mg total) by mouth daily. 90 tablet 2  . diltiazem (DILACOR XR) 240 MG 24 hr capsule TAKE ONE CAPSULE BY MOUTH ONCE DAILY 90 capsule 3  . ferrous sulfate 325 (65 FE) MG tablet Take 325 mg by mouth daily with breakfast.    . furosemide (LASIX) 80 MG tablet Take 1 tablet (80 mg total) by mouth daily. 30 tablet 0  . glucose blood test strip Use TID before meals / Dx E11.9 100 each 12  . glucose monitoring kit (FREESTYLE) monitoring kit 1 each by Does not apply route 4 (four) times daily - after meals and at bedtime. 1 each 1  . Guaifenesin (MUCINEX MAXIMUM STRENGTH) 1200 MG TB12 Take 1 tablet (1,200 mg total) by mouth 2 (two) times daily. 10 tablet 0  . hydrocortisone cream 1 % Apply 1 application topically daily as needed for itching.    . insulin lispro protamine-lispro (HUMALOG MIX 75/25) (75-25) 100 UNIT/ML SUSP injection Inject 50  Units into the skin 2 (two) times daily with a meal. 40 mL 5  . Insulin Syringe-Needle U-100 (RELION INSULIN SYR 1CC/30G) 30G X 5/16" 1 ML MISC 1 each by Other route 2 (two) times daily. USE AS DIRECTED 200 each 3  . isosorbide mononitrate (IMDUR) 120 MG 24 hr tablet Take 1 tablet (120 mg total) by mouth daily. 30 tablet 5  . levofloxacin (LEVAQUIN) 750 MG tablet Take 1 tablet (750 mg total) by mouth daily. 5 tablet 0  . LUMIGAN 0.01 % SOLN Place 1 drop into both eyes 2 (two) times daily.    . Menthol-Methyl Salicylate (MUSCLE RUB EX) Apply 1 application topically daily as needed (pain).    . metoprolol (TOPROL-XL) 200 MG 24 hr tablet TAKE ONE TABLET BY MOUTH ONCE DAILY 90 tablet 0  . nitroGLYCERIN (NITROSTAT) 0.4 MG SL tablet DISSOLVE ONE  TABLET UNDER THE TONGUE EVERY 5 MINUTES AS NEEDED FOR CHEST PAIN.  DO NOT EXCEED A TOTAL OF 3 DOSES IN 15 MINUTES 25 tablet 5  . ondansetron (ZOFRAN) 4 MG tablet Take 1 tablet (4 mg total) by mouth every 6 (six) hours as needed for nausea. 50 tablet 3  . pantoprazole (PROTONIX) 40 MG tablet TAKE ONE TABLET BY MOUTH ONCE DAILY 30 tablet 2  . polyethylene glycol (MIRALAX / GLYCOLAX) packet Take 17 g by mouth daily as needed for mild constipation.    . polyvinyl alcohol (LIQUIFILM TEARS) 1.4 % ophthalmic solution Place 1 drop into both eyes daily as needed. For dry eyes    . ranolazine (RANEXA) 500 MG 12 hr tablet Take 1 tablet (500 mg total) by mouth 2 (two) times daily. 60 tablet 2  . timolol (TIMOPTIC) 0.5 % ophthalmic solution Place 1 drop into both eyes every 12 (twelve) hours. 10 mL 12  . RELION INSULIN SYR 1CC/30G 30G X 5/16" 1 ML MISC USE AS DIRECTED (Patient not taking: Reported on 09/09/2016) 100 each 2   No facility-administered medications prior to visit.     ROS Review of Systems  Constitutional: Positive for fatigue. Negative for chills and fever.  HENT: Positive for congestion and sinus pressure.   Eyes: Negative for visual disturbance.  Respiratory: Positive for cough and shortness of breath.   Cardiovascular: Positive for leg swelling. Negative for chest pain.  Gastrointestinal: Negative for abdominal pain and blood in stool.  Musculoskeletal: Negative for arthralgias and back pain.  Skin: Negative for rash.  Allergic/Immunologic: Negative for immunocompromised state.  Neurological: Positive for dizziness and headaches.  Hematological: Negative for adenopathy. Does not bruise/bleed easily.  Psychiatric/Behavioral: Negative for dysphoric mood and suicidal ideas.    Objective:  BP (!) 177/84 (BP Location: Left Arm, Patient Position: Sitting, Cuff Size: Large)   Pulse 71   Temp 98.2 F (36.8 C) (Oral)   Ht _0  (1.6 m)   Wt 277 lb 12.8 oz (126 kg)   LMP 04/02/2012   SpO2  99%   BMI 49.21 kg/m   BP/Weight 09/14/2016 04/04/9029 0/04/2329  Systolic BP 076 226 333  Diastolic BP 84 85 84  Wt. (Lbs) 277.8 283 244.2  BMI 49.21 50.13 43.26    Physical Exam  Constitutional: She is oriented to person, place, and time. She appears well-developed and well-nourished. No distress.  Obese   HENT:  Head: Normocephalic and atraumatic.  Cardiovascular: Normal rate, regular rhythm, normal heart sounds and intact distal pulses.   Pulmonary/Chest: Effort normal. No respiratory distress. She has rales.  Musculoskeletal: She exhibits edema (2+  in b/l LE ).  Neurological: She is alert and oriented to person, place, and time.  Skin: Skin is warm and dry. No rash noted.  Psychiatric: She has a normal mood and affect.   Lab Results  Component Value Date   HGBA1C 6.4 09/14/2016   CBG 160  EKG: normal EKG, normal sinus rhythm, unchanged from previous tracings. Assessment & Plan:  [name] was seen today for diabetes.  Diagnoses and all orders for this visit:  Essential hypertension -     COMPLETE METABOLIC PANEL WITH GFR -     CBC  Incidental lung nodule, > 80m and < 859m-     CT Chest Wo Contrast; Future  Controlled type 2 diabetes mellitus with complication, with long-term current use of insulin (HCC) -     POCT glucose (manual entry) -     POCT glycosylated hemoglobin (Hb A1C)  Cough -     Brain natriuretic peptide -     EKG 12-Lead -     amoxicillin (AMOXIL) 875 MG tablet; Take 1 tablet (875 mg total) by mouth 2 (two) times daily. -     promethazine-dextromethorphan (PROMETHAZINE-DM) 6.25-15 MG/5ML syrup; Take 5 mLs by mouth 4 (four) times daily as needed for cough.  Chronic diastolic heart failure, NYHA class 3 (HCC) -     furosemide (LASIX) 80 MG tablet; Take 1 tablet (80 mg total) by mouth 2 (two) times daily. -     potassium chloride SA (K-DUR,KLOR-CON) 20 MEQ tablet; Take 2 tablets (40 mEq total) by mouth daily.    No orders of the defined types were  placed in this encounter.   Follow-up: Return in about 2 weeks (around 09/28/2016) for cough.   JoBoykin NearingD

## 2016-09-14 NOTE — Assessment & Plan Note (Signed)
Suspect cough swelling and orthopnea are primary due to diastolic CHF exacerbation given weight gain Plan: Increase lasix BNP Advised patient present directly to ED if symptoms worsen

## 2016-09-15 ENCOUNTER — Telehealth: Payer: Self-pay

## 2016-09-15 LAB — BRAIN NATRIURETIC PEPTIDE: Brain Natriuretic Peptide: 6.2 pg/mL (ref <100)

## 2016-09-15 NOTE — Telephone Encounter (Signed)
Pt was called and informed of CT scan appointment on 09/20/2016 8 :30 am

## 2016-09-16 ENCOUNTER — Telehealth: Payer: Self-pay

## 2016-09-16 NOTE — Telephone Encounter (Signed)
Pt was called and a VM was left informing pt of lab results. DPR on file.

## 2016-09-19 ENCOUNTER — Other Ambulatory Visit: Payer: Self-pay | Admitting: Cardiovascular Disease

## 2016-09-20 ENCOUNTER — Ambulatory Visit (HOSPITAL_COMMUNITY): Payer: Medicare Other

## 2016-09-20 ENCOUNTER — Ambulatory Visit (HOSPITAL_COMMUNITY)
Admission: RE | Admit: 2016-09-20 | Discharge: 2016-09-20 | Disposition: A | Discharge status: Home / Self Care | Payer: Medicare Other | Source: Ambulatory Visit | Attending: Family Medicine | Admitting: Family Medicine

## 2016-09-20 ENCOUNTER — Encounter (HOSPITAL_COMMUNITY): Payer: Self-pay

## 2016-09-20 DIAGNOSIS — R911 Solitary pulmonary nodule: Secondary | ICD-10-CM | POA: Insufficient documentation

## 2016-09-20 DIAGNOSIS — I2510 Atherosclerotic heart disease of native coronary artery without angina pectoris: Secondary | ICD-10-CM | POA: Insufficient documentation

## 2016-09-20 DIAGNOSIS — R918 Other nonspecific abnormal finding of lung field: Secondary | ICD-10-CM | POA: Insufficient documentation

## 2016-09-21 ENCOUNTER — Encounter: Payer: Self-pay | Admitting: Family Medicine

## 2016-09-22 ENCOUNTER — Telehealth: Payer: Self-pay

## 2016-09-22 DIAGNOSIS — H401133 Primary open-angle glaucoma, bilateral, severe stage: Secondary | ICD-10-CM | POA: Diagnosis not present

## 2016-09-22 DIAGNOSIS — H35372 Puckering of macula, left eye: Secondary | ICD-10-CM | POA: Diagnosis not present

## 2016-09-22 DIAGNOSIS — E113532 Type 2 diabetes mellitus with proliferative diabetic retinopathy with traction retinal detachment not involving the macula, left eye: Secondary | ICD-10-CM | POA: Diagnosis not present

## 2016-09-22 DIAGNOSIS — H5410 Blindness, one eye, low vision other eye, unspecified eyes: Secondary | ICD-10-CM | POA: Diagnosis not present

## 2016-09-22 NOTE — Telephone Encounter (Signed)
Pt was called and husband was informed of CT results.

## 2016-09-27 ENCOUNTER — Other Ambulatory Visit: Payer: Self-pay | Admitting: Family Medicine

## 2016-09-27 DIAGNOSIS — Z1231 Encounter for screening mammogram for malignant neoplasm of breast: Secondary | ICD-10-CM

## 2016-09-28 ENCOUNTER — Encounter: Payer: Self-pay | Admitting: Family Medicine

## 2016-09-28 ENCOUNTER — Ambulatory Visit: Payer: Medicare Other | Attending: Family Medicine | Admitting: Family Medicine

## 2016-09-28 VITALS — BP 132/82 | HR 67 | Temp 97.9°F | Ht 63.0 in | Wt 272.8 lb

## 2016-09-28 DIAGNOSIS — K3184 Gastroparesis: Secondary | ICD-10-CM

## 2016-09-28 DIAGNOSIS — I10 Essential (primary) hypertension: Secondary | ICD-10-CM

## 2016-09-28 DIAGNOSIS — Z9889 Other specified postprocedural states: Secondary | ICD-10-CM | POA: Insufficient documentation

## 2016-09-28 DIAGNOSIS — I214 Non-ST elevation (NSTEMI) myocardial infarction: Secondary | ICD-10-CM | POA: Insufficient documentation

## 2016-09-28 DIAGNOSIS — M7989 Other specified soft tissue disorders: Secondary | ICD-10-CM | POA: Diagnosis not present

## 2016-09-28 DIAGNOSIS — I110 Hypertensive heart disease with heart failure: Secondary | ICD-10-CM | POA: Insufficient documentation

## 2016-09-28 DIAGNOSIS — I5032 Chronic diastolic (congestive) heart failure: Secondary | ICD-10-CM | POA: Insufficient documentation

## 2016-09-28 DIAGNOSIS — N183 Chronic kidney disease, stage 3 unspecified: Secondary | ICD-10-CM

## 2016-09-28 DIAGNOSIS — M62838 Other muscle spasm: Secondary | ICD-10-CM | POA: Diagnosis not present

## 2016-09-28 DIAGNOSIS — R911 Solitary pulmonary nodule: Secondary | ICD-10-CM | POA: Diagnosis not present

## 2016-09-28 DIAGNOSIS — Z7982 Long term (current) use of aspirin: Secondary | ICD-10-CM | POA: Insufficient documentation

## 2016-09-28 DIAGNOSIS — Z794 Long term (current) use of insulin: Secondary | ICD-10-CM | POA: Insufficient documentation

## 2016-09-28 DIAGNOSIS — E118 Type 2 diabetes mellitus with unspecified complications: Secondary | ICD-10-CM | POA: Diagnosis not present

## 2016-09-28 DIAGNOSIS — E1143 Type 2 diabetes mellitus with diabetic autonomic (poly)neuropathy: Secondary | ICD-10-CM | POA: Diagnosis not present

## 2016-09-28 DIAGNOSIS — R05 Cough: Secondary | ICD-10-CM | POA: Diagnosis not present

## 2016-09-28 LAB — BASIC METABOLIC PANEL WITH GFR
BUN: 12 mg/dL (ref 7–25)
CALCIUM: 8.6 mg/dL (ref 8.6–10.4)
CO2: 25 mmol/L (ref 20–31)
Chloride: 105 mmol/L (ref 98–110)
Creat: 1.3 mg/dL — ABNORMAL HIGH (ref 0.50–0.99)
GFR, EST AFRICAN AMERICAN: 51 mL/min — AB (ref >=60)
GFR, Est Non African American: 44 mL/min — ABNORMAL LOW (ref >=60)
Glucose, Bld: 102 mg/dL — ABNORMAL HIGH (ref 65–99)
Potassium: 5.2 mmol/L (ref 3.5–5.3)
SODIUM: 138 mmol/L (ref 135–146)

## 2016-09-28 LAB — GLUCOSE, POCT (MANUAL RESULT ENTRY): POC Glucose: 138 mg/dl — AB (ref 70–99)

## 2016-09-28 MED ORDER — ISOSORBIDE MONONITRATE ER 120 MG PO TB24: 120.0000 mg | ORAL_TABLET | Freq: Every day | ORAL | 5 refills | Status: DC

## 2016-09-28 MED ORDER — BACLOFEN 10 MG PO TABS: 10.0000 mg | ORAL_TABLET | Freq: Every evening | ORAL | 5 refills | Status: DC | PRN

## 2016-09-28 MED ORDER — ONDANSETRON HCL 4 MG PO TABS: 4.0000 mg | ORAL_TABLET | Freq: Four times a day (QID) | ORAL | 3 refills | Status: DC | PRN

## 2016-09-28 MED ORDER — POTASSIUM CHLORIDE CRYS ER 20 MEQ PO TBCR: 20.0000 meq | EXTENDED_RELEASE_TABLET | Freq: Two times a day (BID) | ORAL | 2 refills | Status: DC

## 2016-09-28 MED ORDER — FUROSEMIDE 80 MG PO TABS: 80.0000 mg | ORAL_TABLET | Freq: Two times a day (BID) | ORAL | 2 refills | Status: DC

## 2016-09-28 NOTE — Patient Instructions (Addendum)
[name] was seen today for cough.  Diagnoses and all orders for this visit:  Controlled type 2 diabetes mellitus with complication, with long-term current use of insulin (HCC) -     POCT glucose (manual entry)  Diabetic gastroparesis associated with type 2 diabetes mellitus (HCC) -     ondansetron (ZOFRAN) 4 MG tablet; Take 1 tablet (4 mg total) by mouth every 6 (six) hours as needed for nausea.  Essential hypertension  NSTEMI  post-op 04/04/12-medical Rx -     isosorbide mononitrate (IMDUR) 120 MG 24 hr tablet; Take 1 tablet (120 mg total) by mouth daily.  Chronic diastolic heart failure, NYHA class 3 (HCC) -     furosemide (LASIX) 80 MG tablet; Take 1 tablet (80 mg total) by mouth 2 (two) times daily. -     potassium chloride SA (K-DUR,KLOR-CON) 20 MEQ tablet; Take 1 tablet (20 mEq total) by mouth 2 (two) times daily. -     BASIC METABOLIC PANEL WITH GFR  Muscle spasm of left lower extremity -     baclofen (LIORESAL) 10 MG tablet; Take 1 tablet (10 mg total) by mouth at bedtime as needed for muscle spasms.   Dr. Sallyanne Kuster refilled your plavix on 08/31/2016 so he intends for you to continue it. Do not take for 3 days prior to your eye surgery but then retart after surgery   F/u in 2 month for HTN and diabetes   Dr. Adrian Blackwater

## 2016-09-28 NOTE — Assessment & Plan Note (Signed)
Chronic diastolic CHF Cough and leg swelling has improved Plan: Continue lasix 80 mg BID With potassium  Check BMP

## 2016-09-28 NOTE — Progress Notes (Signed)
Subjective:  Patient ID: [name], female    DOB: 1955/10/18  Age: 61 y.o. MRN: [id number]  CC: Cough   HPI [name] has CHF,CAD, HTN and diabetes presents for   1. Cough:  Improved. She is now just taking robitussin. No  Longer have SOB. No CP. Swelling in legs has improved with increased dose of lasix. She still has mild dizziness which she reports his her baseline. She has CAD. Not currently taking plavix. Reports that she believes  her cardiologist advised her to stop, but is not sure.   2. R mid lung nodule: 6 mm x 6 mm R mid lung nodule on CXR. F/u CT chest recommended.  CT chest done 09/20/2016: IMPRESSION: Ground-glass nodular opacity in the posterior segment of the right upper lobe measuring 6 x 6 mm, likely corresponding to the nodular opacity seen on chest radiograph. There is a second ground-glass appearing nodular opacity in the left lower lobe superior segment measuring 4 x 4 mm. Initial follow-up with CT at 6-12 months is recommended to confirm persistence. If persistent, repeat CT is recommended every 2 years until 5 years of stability has been established. This recommendation follows the consensus statement: Guidelines for Management of Incidental Pulmonary Nodules Detected on CT Images: From the Fleischner Society 2017; Radiology 2017; 284:228-243.  No edema or consolidation. No adenopathy. Areas of atherosclerotic calcification. Note that there are multiple foci of coronary artery calcification.  Social History  Substance Use Topics  . Smoking status: Never Smoker  . Smokeless tobacco: Never Used  . Alcohol use No    Outpatient Medications Prior to Visit  Medication Sig Dispense Refill  . ACCU-CHEK SOFTCLIX LANCETS lancets Use as instructed for 3 times daily blood glucose monitoring 100 each 5  . acetaminophen (TYLENOL) 500 MG tablet Take 1 tablet (500 mg total) by mouth every 6 (six) hours as needed. 30 tablet 0  . acetaZOLAMIDE (DIAMOX)  500 MG capsule Take 500 mg by mouth 2 (two) times daily.    Marland Kitchen amoxicillin (AMOXIL) 875 MG tablet Take 1 tablet (875 mg total) by mouth 2 (two) times daily. 20 tablet 0  . aspirin 81 MG tablet Take 81 mg by mouth daily.    Marland Kitchen atorvastatin (LIPITOR) 80 MG tablet TAKE ONE TABLET BY MOUTH IN THE EVENING AT  6PM 90 tablet 3  . baclofen (LIORESAL) 10 MG tablet Take 1 tablet (10 mg total) by mouth at bedtime as needed for muscle spasms. 30 each 0  . benzonatate (TESSALON) 200 MG capsule Take 1 capsule (200 mg total) by mouth 2 (two) times daily as needed for cough. 20 capsule 0  . bismuth subsalicylate (PEPTO BISMOL) 262 MG/15ML suspension Take 30 mLs by mouth every 6 (six) hours as needed for indigestion.     . Blood Glucose Monitoring Suppl (ACCU-CHEK AVIVA PLUS) w/Device KIT Used as directed 1 kit 0  . brimonidine (ALPHAGAN) 0.2 % ophthalmic solution Place 1 drop into both eyes every 12 (twelve) hours. 5 mL 12  . brimonidine-timolol (COMBIGAN) 0.2-0.5 % ophthalmic solution Place 1 drop into both eyes every 12 (twelve) hours.    . calcium carbonate (TUMS - DOSED IN MG ELEMENTAL CALCIUM) 500 MG chewable tablet Chew 1 tablet by mouth 2 (two) times daily as needed for indigestion.     . clopidogrel (PLAVIX) 75 MG tablet Take 1 tablet (75 mg total) by mouth daily. 90 tablet 2  . diltiazem (DILACOR XR) 240 MG 24 hr capsule TAKE ONE  CAPSULE BY MOUTH ONCE DAILY 90 capsule 3  . ferrous sulfate 325 (65 FE) MG tablet Take 325 mg by mouth daily with breakfast.    . furosemide (LASIX) 80 MG tablet Take 1 tablet (80 mg total) by mouth 2 (two) times daily. 60 tablet 0  . glucose blood test strip Use TID before meals / Dx E11.9 100 each 12  . glucose monitoring kit (FREESTYLE) monitoring kit 1 each by Does not apply route 4 (four) times daily - after meals and at bedtime. 1 each 1  . hydrocortisone cream 1 % Apply 1 application topically daily as needed for itching.    . insulin lispro protamine-lispro (HUMALOG MIX  75/25) (75-25) 100 UNIT/ML SUSP injection Inject 50 Units into the skin 2 (two) times daily with a meal. 40 mL 5  . Insulin Syringe-Needle U-100 (RELION INSULIN SYR 1CC/30G) 30G X 5/16" 1 ML MISC 1 each by Other route 2 (two) times daily. USE AS DIRECTED 200 each 3  . isosorbide mononitrate (IMDUR) 120 MG 24 hr tablet Take 1 tablet (120 mg total) by mouth daily. 30 tablet 5  . LUMIGAN 0.01 % SOLN Place 1 drop into both eyes 2 (two) times daily.    . Menthol-Methyl Salicylate (MUSCLE RUB EX) Apply 1 application topically daily as needed (pain).    . metoprolol (TOPROL-XL) 200 MG 24 hr tablet TAKE ONE TABLET BY MOUTH ONCE DAILY 90 tablet 0  . NITROSTAT 0.4 MG SL tablet DISSOLVE 1 TABLET UNDER THE TONGUE EVERY 5 MINUTES AS NEEDED FOR CHEST PAIN. DO NOT EXCEED A TOTAL OF 3 DOSES IN 15 MINUTES 25 tablet 3  . ondansetron (ZOFRAN) 4 MG tablet Take 1 tablet (4 mg total) by mouth every 6 (six) hours as needed for nausea. 50 tablet 3  . pantoprazole (PROTONIX) 40 MG tablet TAKE ONE TABLET BY MOUTH ONCE DAILY 30 tablet 2  . polyethylene glycol (MIRALAX / GLYCOLAX) packet Take 17 g by mouth daily as needed for mild constipation.    . polyvinyl alcohol (LIQUIFILM TEARS) 1.4 % ophthalmic solution Place 1 drop into both eyes daily as needed. For dry eyes    . potassium chloride SA (K-DUR,KLOR-CON) 20 MEQ tablet Take 2 tablets (40 mEq total) by mouth daily. 30 tablet 0  . promethazine-dextromethorphan (PROMETHAZINE-DM) 6.25-15 MG/5ML syrup Take 5 mLs by mouth 4 (four) times daily as needed for cough. 118 mL 0  . ranolazine (RANEXA) 500 MG 12 hr tablet Take 1 tablet (500 mg total) by mouth 2 (two) times daily. 60 tablet 2  . RELION INSULIN SYR 1CC/30G 30G X 5/16" 1 ML MISC USE AS DIRECTED (Patient not taking: Reported on 09/09/2016) 100 each 2  . timolol (TIMOPTIC) 0.5 % ophthalmic solution Place 1 drop into both eyes every 12 (twelve) hours. 10 mL 12   No facility-administered medications prior to visit.      ROS Review of Systems  Constitutional: Negative for chills, fatigue and fever.  HENT: Negative for congestion and sinus pressure.   Eyes: Negative for visual disturbance.  Respiratory: Positive for cough. Negative for shortness of breath.   Cardiovascular: Positive for leg swelling. Negative for chest pain.  Gastrointestinal: Negative for abdominal pain and blood in stool.  Musculoskeletal: Negative for arthralgias and back pain.  Skin: Negative for rash.  Allergic/Immunologic: Negative for immunocompromised state.  Neurological: Positive for dizziness. Negative for headaches.  Hematological: Negative for adenopathy. Does not bruise/bleed easily.  Psychiatric/Behavioral: Negative for dysphoric mood and suicidal ideas.  Objective:  BP 132/82 (BP Location: Left Arm, Patient Position: Sitting, Cuff Size: Large)   Pulse 67   Temp 97.9 F (36.6 C) (Oral)   Ht _0  (1.6 m)   Wt 272 lb 12.8 oz (123.7 kg)   LMP 04/02/2012   SpO2 100%   BMI 48.32 kg/m   BP/Weight 09/28/2016 3/97/6734 08/10/3788  Systolic BP 240 973 532  Diastolic BP 82 84 85  Wt. (Lbs) 272.8 277.8 283  BMI 48.32 49.21 50.13    Physical Exam  Constitutional: She is oriented to person, place, and time. She appears well-developed and well-nourished. No distress.  Obese   HENT:  Head: Normocephalic and atraumatic.  Cardiovascular: Normal rate, regular rhythm, normal heart sounds and intact distal pulses.   Pulmonary/Chest: Effort normal. No respiratory distress. She has no rales.  Musculoskeletal: She exhibits edema (trace in LE ).  Neurological: She is alert and oriented to person, place, and time.  Skin: Skin is warm and dry. No rash noted.  Psychiatric: She has a normal mood and affect.   Lab Results  Component Value Date   HGBA1C 6.4 09/14/2016   CBG 138  Assessment & Plan:  [name] was seen today for cough.  Diagnoses and all orders for this visit:  Controlled type 2 diabetes mellitus with  complication, with long-term current use of insulin (HCC) -     POCT glucose (manual entry)  Diabetic gastroparesis associated with type 2 diabetes mellitus (HCC) -     ondansetron (ZOFRAN) 4 MG tablet; Take 1 tablet (4 mg total) by mouth every 6 (six) hours as needed for nausea.  Essential hypertension  NSTEMI  post-op 04/04/12-medical Rx -     isosorbide mononitrate (IMDUR) 120 MG 24 hr tablet; Take 1 tablet (120 mg total) by mouth daily.  Chronic diastolic heart failure, NYHA class 3 (HCC) -     furosemide (LASIX) 80 MG tablet; Take 1 tablet (80 mg total) by mouth 2 (two) times daily. -     potassium chloride SA (K-DUR,KLOR-CON) 20 MEQ tablet; Take 1 tablet (20 mEq total) by mouth 2 (two) times daily. -     BASIC METABOLIC PANEL WITH GFR  Muscle spasm of left lower extremity -     baclofen (LIORESAL) 10 MG tablet; Take 1 tablet (10 mg total) by mouth at bedtime as needed for muscle spasms.    No orders of the defined types were placed in this encounter.   Follow-up: Return in about 2 months (around 11/26/2016) for HTN .   Boykin Nearing MD

## 2016-10-12 NOTE — Addendum Note (Signed)
Addended by: Boykin Nearing on: 10/12/2016 08:40 AM   Modules accepted: Orders

## 2016-10-13 ENCOUNTER — Telehealth: Payer: Self-pay

## 2016-10-13 NOTE — Telephone Encounter (Signed)
Pt was called and informed OF LAB RESULTS.

## 2016-10-19 ENCOUNTER — Inpatient Hospital Stay: Admission: RE | Admit: 2016-10-19 | Payer: Medicare Other | Source: Ambulatory Visit

## 2016-10-21 DIAGNOSIS — H5711 Ocular pain, right eye: Secondary | ICD-10-CM | POA: Diagnosis not present

## 2016-10-21 DIAGNOSIS — H35372 Puckering of macula, left eye: Secondary | ICD-10-CM | POA: Diagnosis not present

## 2016-10-21 DIAGNOSIS — H5410 Blindness, one eye, low vision other eye, unspecified eyes: Secondary | ICD-10-CM | POA: Diagnosis not present

## 2016-10-21 DIAGNOSIS — H47233 Glaucomatous optic atrophy, bilateral: Secondary | ICD-10-CM | POA: Diagnosis not present

## 2016-11-02 ENCOUNTER — Ambulatory Visit: Payer: Medicare Other | Admitting: Cardiovascular Disease

## 2016-11-08 DIAGNOSIS — N183 Chronic kidney disease, stage 3 (moderate): Secondary | ICD-10-CM | POA: Diagnosis not present

## 2016-11-15 ENCOUNTER — Ambulatory Visit
Admission: RE | Admit: 2016-11-15 | Discharge: 2016-11-15 | Disposition: A | Discharge status: Home / Self Care | Payer: Medicare Other | Source: Ambulatory Visit | Attending: Family Medicine | Admitting: Family Medicine

## 2016-11-15 DIAGNOSIS — Z1231 Encounter for screening mammogram for malignant neoplasm of breast: Secondary | ICD-10-CM

## 2016-11-26 ENCOUNTER — Ambulatory Visit: Payer: Medicare Other | Attending: Family Medicine | Admitting: Family Medicine

## 2016-11-26 ENCOUNTER — Encounter: Payer: Self-pay | Admitting: Family Medicine

## 2016-11-26 VITALS — BP 163/84 | HR 70 | Temp 98.1°F | Ht 63.0 in | Wt 278.0 lb

## 2016-11-26 DIAGNOSIS — I2510 Atherosclerotic heart disease of native coronary artery without angina pectoris: Secondary | ICD-10-CM | POA: Insufficient documentation

## 2016-11-26 DIAGNOSIS — E1143 Type 2 diabetes mellitus with diabetic autonomic (poly)neuropathy: Secondary | ICD-10-CM | POA: Insufficient documentation

## 2016-11-26 DIAGNOSIS — I110 Hypertensive heart disease with heart failure: Secondary | ICD-10-CM | POA: Insufficient documentation

## 2016-11-26 DIAGNOSIS — E11319 Type 2 diabetes mellitus with unspecified diabetic retinopathy without macular edema: Secondary | ICD-10-CM | POA: Diagnosis not present

## 2016-11-26 DIAGNOSIS — E118 Type 2 diabetes mellitus with unspecified complications: Secondary | ICD-10-CM

## 2016-11-26 DIAGNOSIS — Z0000 Encounter for general adult medical examination without abnormal findings: Secondary | ICD-10-CM

## 2016-11-26 DIAGNOSIS — I252 Old myocardial infarction: Secondary | ICD-10-CM | POA: Diagnosis not present

## 2016-11-26 DIAGNOSIS — K3184 Gastroparesis: Secondary | ICD-10-CM | POA: Insufficient documentation

## 2016-11-26 DIAGNOSIS — M25561 Pain in right knee: Secondary | ICD-10-CM | POA: Insufficient documentation

## 2016-11-26 DIAGNOSIS — I10 Essential (primary) hypertension: Secondary | ICD-10-CM | POA: Insufficient documentation

## 2016-11-26 DIAGNOSIS — R350 Frequency of micturition: Secondary | ICD-10-CM | POA: Insufficient documentation

## 2016-11-26 DIAGNOSIS — Z794 Long term (current) use of insulin: Secondary | ICD-10-CM | POA: Diagnosis not present

## 2016-11-26 DIAGNOSIS — E669 Obesity, unspecified: Secondary | ICD-10-CM | POA: Diagnosis not present

## 2016-11-26 DIAGNOSIS — Z23 Encounter for immunization: Secondary | ICD-10-CM | POA: Diagnosis not present

## 2016-11-26 DIAGNOSIS — I5032 Chronic diastolic (congestive) heart failure: Secondary | ICD-10-CM | POA: Insufficient documentation

## 2016-11-26 DIAGNOSIS — E538 Deficiency of other specified B group vitamins: Secondary | ICD-10-CM

## 2016-11-26 DIAGNOSIS — R200 Anesthesia of skin: Secondary | ICD-10-CM | POA: Diagnosis not present

## 2016-11-26 DIAGNOSIS — Z7982 Long term (current) use of aspirin: Secondary | ICD-10-CM | POA: Diagnosis not present

## 2016-11-26 DIAGNOSIS — N289 Disorder of kidney and ureter, unspecified: Secondary | ICD-10-CM | POA: Insufficient documentation

## 2016-11-26 LAB — POCT UA - MICROALBUMIN

## 2016-11-26 LAB — POCT GLYCOSYLATED HEMOGLOBIN (HGB A1C): Hemoglobin A1C: 6.8

## 2016-11-26 LAB — GLUCOSE, POCT (MANUAL RESULT ENTRY): POC GLUCOSE: 171 mg/dL — AB (ref 70–99)

## 2016-11-26 MED ORDER — METOCLOPRAMIDE HCL 5 MG PO TABS: 5.0000 mg | ORAL_TABLET | Freq: Three times a day (TID) | ORAL | 2 refills | Status: DC

## 2016-11-26 MED ORDER — METHYLPREDNISOLONE ACETATE 40 MG/ML IJ SUSP
40.0000 mg | Freq: Once | INTRAMUSCULAR | Status: AC
  Administered 2016-11-26: 40 mg via INTRAMUSCULAR

## 2016-11-26 MED ORDER — ACCU-CHEK AVIVA PLUS W/DEVICE KIT: PACK | 0 refills | Status: DC

## 2016-11-26 NOTE — Assessment & Plan Note (Signed)
A: remains well controlled just slight decline with rise in A1c to 6.8 P: Increase exercise Monitor CBGs Low sugar diet Continue current regimen of humalog 75/25 50 U BID lipitor 80 mg daily and aspirin 81 mg daily

## 2016-11-26 NOTE — Assessment & Plan Note (Signed)
Morbid obesity complicating diabetes, HTN and arthritis Exercise and weight loss addressed

## 2016-11-26 NOTE — Assessment & Plan Note (Signed)
A: pain without trauma, suspect OA in R knee P: Steroid injection done today Weight loss addressed

## 2016-11-26 NOTE — Patient Instructions (Addendum)
[name] was seen today for diabetes and hypertension.  Diagnoses and all orders for this visit:  Controlled type 2 diabetes mellitus with complication, with long-term current use of insulin (HCC) -     Glucose (CBG) -     HgB A1c -     POCT UA - Microalbumin -     Blood Glucose Monitoring Suppl (ACCU-CHEK AVIVA PLUS) w/Device KIT; Used as directed  Healthcare maintenance -     Ambulatory referral to Gastroenterology  Acute pain of right knee -     methylPREDNISolone acetate (DEPO-MEDROL) injection 40 mg; Inject 1 mL (40 mg total) into the muscle once.  Diabetic gastroparesis associated with type 2 diabetes mellitus (HCC) -     metoCLOPramide (REGLAN) 5 MG tablet; Take 1 tablet (5 mg total) by mouth 3 (three) times daily before meals.  Bilateral hand numbness -     Vitamin B12   Replaced zofran with reglan with meals to help with gastroparesis symptoms (nausea and constipation)  You have received a shot of steroid in your joint today. Rest and ice knee today. Regular activity tomorrow. Look out for redness, swelling, fever,severe pain in joint and call if you experience these symptoms.   Please start exercising for 30 minutes 3 days a week, work up to 5 days per week Low impact exercises Walking Yoga Chair exercises Look on line or on TV for exercise programs  Exercise is for heart health, balance, strength   f/u in 6 weeks for HTN and f/u response to reglan for gastroparesis   Dr. Adrian Blackwater

## 2016-11-26 NOTE — Assessment & Plan Note (Signed)
A: elevated BP, evidence of fluid retention P: Checking BMP with GFR Continue lasix 80 mg BID for now

## 2016-11-26 NOTE — Progress Notes (Signed)
Subjective:  Patient ID: [name], female    DOB: 1956-05-16  Age: 61 y.o. MRN: [id number]  CC: Diabetes and Hypertension   HPI [name] has diabetes with complications (retinopathy, gastroparesis, renal insufficiency), HTN, CAD s/p NSTEMI, chronic diastolic heart failure, obesity she presents for   1. CHRONIC DIABETES  Disease Monitoring  Blood Sugar Ranges: not checking   Polyuria: yes, on lasix 80 mg BID   Visual problems: yes   Medication Compliance: yes  Medication Side Effects  Hypoglycemia: no   Preventitive Health Care  Eye Exam: done last Month, scheduled for laser surgery in R eye   Foot Exam: done today   Diet pattern: eating high sugar diet, she takes zofran before meals to prevent nausea. She has chronic constipation.   Exercise: none   2. CHRONIC HYPERTENSION  Disease Monitoring  Blood pressure range: not checking at home  Chest pain: no   Dyspnea: no   Claudication: no   Medication compliance: yes  Medication Side Effects  Lightheadedness: no   Urinary frequency: yes, on lasix    Edema: yes   Low salt: yes     3. R knee pain: x 1 month. Aching and throbbing. 10/10 pain. She does not take oral NSAID du to renal insufficiency. She is obese. No exercise. She denies trauma to her knee. She walks with a cane.   Social History  Substance Use Topics  . Smoking status: Never Smoker  . Smokeless tobacco: Never Used  . Alcohol use No    Outpatient Medications Prior to Visit  Medication Sig Dispense Refill  . ACCU-CHEK SOFTCLIX LANCETS lancets Use as instructed for 3 times daily blood glucose monitoring 100 each 5  . acetaminophen (TYLENOL) 500 MG tablet Take 1 tablet (500 mg total) by mouth every 6 (six) hours as needed. 30 tablet 0  . acetaZOLAMIDE (DIAMOX) 500 MG capsule Take 500 mg by mouth 2 (two) times daily.    Marland Kitchen aspirin 81 MG tablet Take 81 mg by mouth daily.    Marland Kitchen atorvastatin (LIPITOR) 80 MG tablet TAKE ONE TABLET BY MOUTH IN THE  EVENING AT  6PM 90 tablet 3  . baclofen (LIORESAL) 10 MG tablet Take 1 tablet (10 mg total) by mouth at bedtime as needed for muscle spasms. 30 each 5  . bismuth subsalicylate (PEPTO BISMOL) 262 MG/15ML suspension Take 30 mLs by mouth every 6 (six) hours as needed for indigestion.     . Blood Glucose Monitoring Suppl (ACCU-CHEK AVIVA PLUS) w/Device KIT Used as directed 1 kit 0  . brimonidine (ALPHAGAN) 0.2 % ophthalmic solution Place 1 drop into both eyes every 12 (twelve) hours. 5 mL 12  . brimonidine-timolol (COMBIGAN) 0.2-0.5 % ophthalmic solution Place 1 drop into both eyes every 12 (twelve) hours.    . calcium carbonate (TUMS - DOSED IN MG ELEMENTAL CALCIUM) 500 MG chewable tablet Chew 1 tablet by mouth 2 (two) times daily as needed for indigestion.     . clopidogrel (PLAVIX) 75 MG tablet Take 1 tablet (75 mg total) by mouth daily. (Patient not taking: Reported on 09/28/2016) 90 tablet 2  . diltiazem (DILACOR XR) 240 MG 24 hr capsule TAKE ONE CAPSULE BY MOUTH ONCE DAILY 90 capsule 3  . ferrous sulfate 325 (65 FE) MG tablet Take 325 mg by mouth daily with breakfast.    . furosemide (LASIX) 80 MG tablet Take 1 tablet (80 mg total) by mouth 2 (two) times daily. 60 tablet 2  .  glucose blood test strip Use TID before meals / Dx E11.9 100 each 12  . glucose monitoring kit (FREESTYLE) monitoring kit 1 each by Does not apply route 4 (four) times daily - after meals and at bedtime. 1 each 1  . hydrocortisone cream 1 % Apply 1 application topically daily as needed for itching.    . insulin lispro protamine-lispro (HUMALOG MIX 75/25) (75-25) 100 UNIT/ML SUSP injection Inject 50 Units into the skin 2 (two) times daily with a meal. 40 mL 5  . Insulin Syringe-Needle U-100 (RELION INSULIN SYR 1CC/30G) 30G X 5/16" 1 ML MISC 1 each by Other route 2 (two) times daily. USE AS DIRECTED 200 each 3  . isosorbide mononitrate (IMDUR) 120 MG 24 hr tablet Take 1 tablet (120 mg total) by mouth daily. 30 tablet 5  .  LUMIGAN 0.01 % SOLN Place 1 drop into both eyes 2 (two) times daily.    . Menthol-Methyl Salicylate (MUSCLE RUB EX) Apply 1 application topically daily as needed (pain).    . metoprolol (TOPROL-XL) 200 MG 24 hr tablet TAKE ONE TABLET BY MOUTH ONCE DAILY 90 tablet 0  . NITROSTAT 0.4 MG SL tablet DISSOLVE 1 TABLET UNDER THE TONGUE EVERY 5 MINUTES AS NEEDED FOR CHEST PAIN. DO NOT EXCEED A TOTAL OF 3 DOSES IN 15 MINUTES 25 tablet 3  . ondansetron (ZOFRAN) 4 MG tablet Take 1 tablet (4 mg total) by mouth every 6 (six) hours as needed for nausea. 50 tablet 3  . pantoprazole (PROTONIX) 40 MG tablet TAKE ONE TABLET BY MOUTH ONCE DAILY 30 tablet 2  . polyethylene glycol (MIRALAX / GLYCOLAX) packet Take 17 g by mouth daily as needed for mild constipation.    . polyvinyl alcohol (LIQUIFILM TEARS) 1.4 % ophthalmic solution Place 1 drop into both eyes daily as needed. For dry eyes    . potassium chloride SA (K-DUR,KLOR-CON) 20 MEQ tablet Take 1 tablet (20 mEq total) by mouth 2 (two) times daily. 60 tablet 2  . ranolazine (RANEXA) 500 MG 12 hr tablet Take 1 tablet (500 mg total) by mouth 2 (two) times daily. 60 tablet 2  . RELION INSULIN SYR 1CC/30G 30G X 5/16" 1 ML MISC USE AS DIRECTED (Patient not taking: Reported on 09/09/2016) 100 each 2  . timolol (TIMOPTIC) 0.5 % ophthalmic solution Place 1 drop into both eyes every 12 (twelve) hours. 10 mL 12   No facility-administered medications prior to visit.     ROS Review of Systems  Constitutional: Negative for chills and fever.  Eyes: Positive for pain (R eye pain ) and visual disturbance (R eye).  Respiratory: Negative for shortness of breath.   Cardiovascular: Negative for chest pain.  Gastrointestinal: Positive for constipation and nausea. Negative for abdominal pain and blood in stool.  Musculoskeletal: Positive for arthralgias (R knee pain x 1 month). Negative for back pain.  Skin: Negative for rash.  Allergic/Immunologic: Negative for immunocompromised  state.  Neurological: Positive for numbness (in hands for 5 years ).  Hematological: Negative for adenopathy. Does not bruise/bleed easily.  Psychiatric/Behavioral: Negative for dysphoric mood and suicidal ideas.    Objective:  BP (!) 163/84   Pulse 70   Temp 98.1 F (36.7 C) (Oral)   Ht '5\' 3"'  (1.6 m)   Wt 278 lb (126.1 kg)   LMP 04/02/2012   SpO2 100%   BMI 49.25 kg/m   BP/Weight 11/26/2016 09/28/2016 6/62/9476  Systolic BP 546 503 546  Diastolic BP 84 82 84  Wt. (  Lbs) 278 272.8 277.8  BMI 49.25 48.32 49.21   Wt Readings from Last 3 Encounters:  11/26/16 278 lb (126.1 kg)  09/28/16 272 lb 12.8 oz (123.7 kg)  09/14/16 277 lb 12.8 oz (126 kg)    Physical Exam  Constitutional: She is oriented to person, place, and time. She appears well-developed and well-nourished. No distress.  Obese  Walks with a cane   HENT:  Head: Normocephalic and atraumatic.  Eyes: EOM and lids are normal.    Cardiovascular: Normal rate, regular rhythm, normal heart sounds and intact distal pulses.   Pulmonary/Chest: Effort normal and breath sounds normal.  Musculoskeletal: She exhibits edema (1 + b/l LE up to mid shin).  Neurological: She is alert and oriented to person, place, and time.  Skin: Skin is warm and dry. No rash noted.  Psychiatric: She has a normal mood and affect.   Lab Results  Component Value Date   HGBA1C 6.4 09/14/2016   Lab Results  Component Value Date   HGBA1C 6.8 11/26/2016   CBG 171  After obtaining informed consent and cleaning the skin using iodine and alcohol a  steroid injection was performed at R knee.  Using 4:1 , 1 ml of  1% plain Lidocaine and 40 mg/ml of Depo Medrol. This was well tolerated.   Assessment & Plan:  [name] was seen today for diabetes and hypertension.  Diagnoses and all orders for this visit:  Controlled type 2 diabetes mellitus with complication, with long-term current use of insulin (HCC) -     Glucose (CBG) -     HgB A1c -     POCT  UA - Microalbumin -     Blood Glucose Monitoring Suppl (ACCU-CHEK AVIVA PLUS) w/Device KIT; Used as directed  Healthcare maintenance -     Ambulatory referral to Gastroenterology  Acute pain of right knee -     methylPREDNISolone acetate (DEPO-MEDROL) injection 40 mg; Inject 1 mL (40 mg total) into the muscle once.  Diabetic gastroparesis associated with type 2 diabetes mellitus (HCC) -     metoCLOPramide (REGLAN) 5 MG tablet; Take 1 tablet (5 mg total) by mouth 3 (three) times daily before meals.  Bilateral hand numbness -     Vitamin B12 -     Iron and TIBC -     Ferritin  Other orders -     Pneumococcal polysaccharide vaccine 23-valent greater than or equal to 2yo subcutaneous/IM   There are no diagnoses linked to this encounter.  No orders of the defined types were placed in this encounter.   Follow-up: Return in about 6 weeks (around 01/07/2017) for gastorparesis and HTN .   Boykin Nearing MD

## 2016-11-26 NOTE — Assessment & Plan Note (Signed)
A: persistent nausea with meals and constipation P: D.c Zofran Trial of low dose raglan

## 2016-11-26 NOTE — Assessment & Plan Note (Signed)
Chronic x 5 years Checking iron levels and vitamin B12

## 2016-11-27 LAB — IRON AND TIBC
IRON SATURATION: 19 % (ref 15–55)
IRON: 52 ug/dL (ref 27–139)
TIBC: 272 ug/dL (ref 250–450)
UIBC: 220 ug/dL (ref 118–369)

## 2016-11-27 LAB — VITAMIN B12: VITAMIN B 12: 199 pg/mL — AB (ref 232–1245)

## 2016-11-27 LAB — FERRITIN: Ferritin: 96 ng/mL (ref 15–150)

## 2016-11-28 ENCOUNTER — Other Ambulatory Visit: Payer: Self-pay | Admitting: Cardiovascular Disease

## 2016-11-29 NOTE — Telephone Encounter (Signed)
REFILL 

## 2016-12-09 DIAGNOSIS — E119 Type 2 diabetes mellitus without complications: Secondary | ICD-10-CM | POA: Diagnosis not present

## 2016-12-09 DIAGNOSIS — N183 Chronic kidney disease, stage 3 (moderate): Secondary | ICD-10-CM | POA: Diagnosis not present

## 2016-12-09 DIAGNOSIS — D631 Anemia in chronic kidney disease: Secondary | ICD-10-CM | POA: Diagnosis not present

## 2016-12-09 DIAGNOSIS — I10 Essential (primary) hypertension: Secondary | ICD-10-CM | POA: Diagnosis not present

## 2016-12-09 DIAGNOSIS — E538 Deficiency of other specified B group vitamins: Secondary | ICD-10-CM | POA: Insufficient documentation

## 2016-12-09 MED ORDER — VITAMIN B-12 100 MCG PO TABS: 100.0000 ug | ORAL_TABLET | Freq: Every day | ORAL | 3 refills | Status: DC

## 2016-12-09 NOTE — Addendum Note (Signed)
Addended by: Boykin Nearing on: 12/09/2016 04:45 PM   Modules accepted: Orders

## 2016-12-10 ENCOUNTER — Telehealth: Payer: Self-pay

## 2016-12-10 DIAGNOSIS — I200 Unstable angina: Secondary | ICD-10-CM

## 2016-12-10 NOTE — Telephone Encounter (Signed)
-----   Message from Sanda Klein, MD sent at 12/09/2016  9:38 AM EDT ----- Regarding: RE: start ARB No problem, Ryan. Chelley, can you please order those labs at her upcoming appt? MCr ----- Message ----- From: Rexene Agent, MD Sent: 12/09/2016   8:34 AM To: Sanda Klein, MD Subject: start ARB                                      Good morning, I just finished seeing this patient for mild CKD. Blood pressure remains mildly uncontrolled and I initiated losartan 25 mg daily and asked her to take her potassium chloride 20 mEq once daily instead of twice daily. When she sees you in 2 weeks would you mind following up and arranging for a BMP? Seemed like it be easier to follow through when she sees you then coming back in for labs with me.

## 2016-12-15 ENCOUNTER — Telehealth: Payer: Self-pay

## 2016-12-15 ENCOUNTER — Other Ambulatory Visit: Payer: Self-pay

## 2016-12-15 ENCOUNTER — Encounter: Payer: Self-pay | Admitting: Family Medicine

## 2016-12-15 MED ORDER — ACCU-CHEK SOFTCLIX LANCETS MISC: 5 refills | Status: DC

## 2016-12-15 MED ORDER — GLUCOSE BLOOD VI STRP: ORAL_STRIP | 12 refills | Status: DC

## 2016-12-15 NOTE — Telephone Encounter (Signed)
Pt was called and informed of lab results and medication being sent to pharmacy.

## 2016-12-20 ENCOUNTER — Other Ambulatory Visit: Payer: Self-pay | Admitting: Family Medicine

## 2016-12-22 ENCOUNTER — Other Ambulatory Visit: Payer: Self-pay | Admitting: Pharmacist

## 2016-12-22 DIAGNOSIS — E118 Type 2 diabetes mellitus with unspecified complications: Secondary | ICD-10-CM

## 2016-12-22 DIAGNOSIS — Z794 Long term (current) use of insulin: Principal | ICD-10-CM

## 2016-12-22 MED ORDER — "INSULIN SYRINGE-NEEDLE U-100 31G X 15/64"" 1 ML MISC": 5 refills | Status: DC

## 2016-12-23 ENCOUNTER — Encounter: Payer: Self-pay | Admitting: Cardiovascular Disease

## 2016-12-23 ENCOUNTER — Ambulatory Visit (INDEPENDENT_AMBULATORY_CARE_PROVIDER_SITE_OTHER): Payer: Medicare Other | Admitting: Cardiovascular Disease

## 2016-12-23 VITALS — BP 144/76 | HR 80 | Ht 63.0 in | Wt 287.0 lb

## 2016-12-23 DIAGNOSIS — E785 Hyperlipidemia, unspecified: Secondary | ICD-10-CM | POA: Diagnosis not present

## 2016-12-23 DIAGNOSIS — E669 Obesity, unspecified: Secondary | ICD-10-CM | POA: Diagnosis not present

## 2016-12-23 DIAGNOSIS — E118 Type 2 diabetes mellitus with unspecified complications: Secondary | ICD-10-CM | POA: Diagnosis not present

## 2016-12-23 DIAGNOSIS — I25118 Atherosclerotic heart disease of native coronary artery with other forms of angina pectoris: Secondary | ICD-10-CM

## 2016-12-23 DIAGNOSIS — Z79899 Other long term (current) drug therapy: Secondary | ICD-10-CM | POA: Diagnosis not present

## 2016-12-23 DIAGNOSIS — N183 Chronic kidney disease, stage 3 unspecified: Secondary | ICD-10-CM

## 2016-12-23 DIAGNOSIS — I5032 Chronic diastolic (congestive) heart failure: Secondary | ICD-10-CM

## 2016-12-23 DIAGNOSIS — Z794 Long term (current) use of insulin: Secondary | ICD-10-CM

## 2016-12-23 MED ORDER — METOLAZONE 2.5 MG PO TABS: 2.5000 mg | ORAL_TABLET | ORAL | 3 refills | Status: DC

## 2016-12-23 MED ORDER — METOLAZONE 2.5 MG PO TABS: 2.5000 mg | ORAL_TABLET | Freq: Every day | ORAL | 3 refills | Status: DC

## 2016-12-23 NOTE — Patient Instructions (Signed)
Medication Instructions: Dr Sallyanne Kuster has recommended making the following medication changes: 1. TAKE Metolazone 2.5 mg when instructed >Please do not take this medication until you receive the results of your lab work  Labwork: Your physician recommends that you return for lab work TODAY.  Testing/Procedures: NONE ORDERED  Follow-up: Dr Sallyanne Kuster recommends that you schedule a follow-up appointment in 6 months. You will receive a reminder letter in the mail two months in advance. If you don't receive a letter, please call our office to schedule the follow-up appointment.  If you need a refill on your cardiac medications before your next appointment, please call your pharmacy.

## 2016-12-23 NOTE — Progress Notes (Signed)
Patient ID: [name], female   DOB: [birth year], 61 y.o.   MRN: [id number]    Cardiology Office Note    Date:  12/23/2016   ID:  Elizabeth, Burns 1956-01-25, MRN [id number]  PCP:  Elizabeth Nearing, MD  Cardiologist:   Elizabeth Klein, MD   Chief Complaint  Patient presents with  . Follow-up    6 Months  . Headache  . Shortness of Breath    History of Present Illness:  [name] is a 60 y.o. female with long-standing severe diabetes mellitus with multiple microvascular and macrovascular complications. She has extensive multivessel coronary artery disease with diffuse involvement, making her a poor candidate for either surgical or percutaneous revascularization.   She is more edematous than usual, roughly 10-15 pounds above her usual optimal weight. She weighs 4 pounds more than when she saw Dr. Joelyn Burns on May 10. She weighs 15 pounds more than a year ago. She has substantially worsened leg edema. She always has 5-6 pillow orthopnea and this has not really changed for years, but she believes she has more shortness of breath with activity than usual. About a week ago she had an episode of angina for which she took 2 sublingual nitroglycerin. The symptoms resolved promptly and have not returned since. He denies syncope or palpitations.  When she saw Dr. Joelyn Burns on May 10 her creatinine was 1.3 and her systolic blood pressure was 159 mm Hg . He added losartan 25 mg daily, with plans for [name] to check Elizabeth. Burns's labs at this visit. Her blood pressure is better at 144 mmHg  She will undergo some type of retinal laser surgery to her right eye next week. She has not had falls recently.  Glucose control remains good. A few weeks ago her hemoglobin A1c was 6.8%.  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 She also has systemic hypertension  and left ventricular hypertrophy with chronic diastolic heart failure in the setting of morbid obesity and obstructive sleep apnea. Her most recent left ventricular ejection fraction was 55-60% by the echocardiogram performed in July 2017. Estimated systolic PA pressure was 37 mmHg.  Past Medical History:  Diagnosis Date  . Anemia   . Blind right eye   . Chronic back pain    "my whole back" (03/18/2015)  . Chronic diastolic heart failure ([name]) 9/13   echo 03/20/15 LV Ef of 60-65%, grade 2DD and PA peak pressure: 36 mm Hg   . Coronary artery disease    Medical Rx  . Diabetic retinopathy ([place])   . GERD (gastroesophageal reflux disease)   . Headache   . History of blood transfusion "several"   "related to menopause"  . Hyperlipemia   . Hypertension   . Morbid obesity with BMI of 40.0-44.9, adult ([place])   . Myocardial infarction ([place]) "several"  . Neuropathy    hands    diabetic neuropthy  . Obstructive sleep apnea    "they say I do; I say I don't; no mask" (03/18/2015)  . Renal insufficiency   . Type II diabetes mellitus (HCC)    insulin dependent    Past Surgical History:  Procedure Laterality Date  . CARDIAC CATHETERIZATION  July 2010  . CATARACT EXTRACTION, BILATERAL Bilateral   . DILATION AND CURETTAGE OF UTERUS    . EYE SURGERY Bilateral "several"  . INCISION AND DRAINAGE PERIRECTAL ABSCESS  04/03/2012  Outpatient Medications Prior to Visit  Medication Sig Dispense Refill  . ACCU-CHEK SOFTCLIX LANCETS lancets Use as instructed for 3 times daily blood glucose monitoring 100 each 5  . acetaminophen (TYLENOL) 500 MG tablet Take 1 tablet (500 mg total) by mouth every 6 (six) hours as needed. 30 tablet 0  . acetaZOLAMIDE (DIAMOX) 500 MG capsule Take 500 mg by mouth 2 (two) times daily.    Marland Kitchen aspirin 81 MG tablet Take 81 mg by mouth daily.    Marland Kitchen atorvastatin (LIPITOR) 80 MG tablet TAKE ONE TABLET BY MOUTH IN THE EVENING AT  6PM 90 tablet 3  . baclofen (LIORESAL) 10 MG tablet  Take 1 tablet (10 mg total) by mouth at bedtime as needed for muscle spasms. 30 each 5  . bismuth subsalicylate (PEPTO BISMOL) 262 MG/15ML suspension Take 30 mLs by mouth every 6 (six) hours as needed for indigestion.     . Blood Glucose Monitoring Suppl (ACCU-CHEK AVIVA PLUS) w/Device KIT Used as directed 1 kit 0  . brimonidine (ALPHAGAN) 0.2 % ophthalmic solution Place 1 drop into both eyes every 12 (twelve) hours. 5 mL 12  . brimonidine-timolol (COMBIGAN) 0.2-0.5 % ophthalmic solution Place 1 drop into both eyes every 12 (twelve) hours.    . calcium carbonate (TUMS - DOSED IN MG ELEMENTAL CALCIUM) 500 MG chewable tablet Chew 1 tablet by mouth 2 (two) times daily as needed for indigestion.     . clopidogrel (PLAVIX) 75 MG tablet Take 1 tablet (75 mg total) by mouth daily. 90 tablet 2  . diltiazem (DILACOR XR) 240 MG 24 hr capsule TAKE ONE CAPSULE BY MOUTH ONCE DAILY 90 capsule 3  . ferrous sulfate 325 (65 FE) MG tablet Take 325 mg by mouth daily with breakfast.    . glucose blood test strip Use TID before meals / Dx E11.9 100 each 12  . glucose monitoring kit (FREESTYLE) monitoring kit 1 each by Does not apply route 4 (four) times daily - after meals and at bedtime. 1 each 1  . hydrocortisone cream 1 % Apply 1 application topically daily as needed for itching.    . insulin lispro protamine-lispro (HUMALOG MIX 75/25) (75-25) 100 UNIT/ML SUSP injection Inject 50 Units into the skin 2 (two) times daily with a meal. 40 mL 5  . Insulin Syringe-Needle U-100 (RELION INSULIN SYR 1CC/30G) 30G X 5/16" 1 ML MISC 1 each by Other route 2 (two) times daily. USE AS DIRECTED 200 each 3  . Insulin Syringe-Needle U-100 (RELION INSULIN SYRINGE) 31G X 15/64" 1 ML MISC Use as directed for twice daily insulin adminstration. E11.9 100 each 5  . isosorbide mononitrate (IMDUR) 120 MG 24 hr tablet Take 1 tablet (120 mg total) by mouth daily. 30 tablet 5  . LUMIGAN 0.01 % SOLN Place 1 drop into both eyes 2 (two) times daily.     . Menthol-Methyl Salicylate (MUSCLE RUB EX) Apply 1 application topically daily as needed (pain).    Marland Kitchen metoCLOPramide (REGLAN) 5 MG tablet Take 1 tablet (5 mg total) by mouth 3 (three) times daily before meals. 90 tablet 2  . metoprolol (TOPROL-XL) 200 MG 24 hr tablet TAKE ONE TABLET BY MOUTH ONCE DAILY 90 tablet 0  . NITROSTAT 0.4 MG SL tablet DISSOLVE 1 TABLET UNDER THE TONGUE EVERY 5 MINUTES AS NEEDED FOR CHEST PAIN. DO NOT EXCEED A TOTAL OF 3 DOSES IN 15 MINUTES 25 tablet 3  . pantoprazole (PROTONIX) 40 MG tablet TAKE ONE TABLET BY MOUTH ONCE DAILY  30 tablet 2  . polyethylene glycol (MIRALAX / GLYCOLAX) packet Take 17 g by mouth daily as needed for mild constipation.    . polyvinyl alcohol (LIQUIFILM TEARS) 1.4 % ophthalmic solution Place 1 drop into both eyes daily as needed. For dry eyes    . potassium chloride SA (K-DUR,KLOR-CON) 20 MEQ tablet Take 1 tablet (20 mEq total) by mouth 2 (two) times daily. 60 tablet 2  . ranolazine (RANEXA) 500 MG 12 hr tablet Take 1 tablet (500 mg total) by mouth 2 (two) times daily. 60 tablet 2  . timolol (TIMOPTIC) 0.5 % ophthalmic solution Place 1 drop into both eyes every 12 (twelve) hours. 10 mL 12  . vitamin B-12 (CYANOCOBALAMIN) 100 MCG tablet Take 1 tablet (100 mcg total) by mouth daily. 90 tablet 3  . furosemide (LASIX) 80 MG tablet Take 1 tablet (80 mg total) by mouth 2 (two) times daily. 60 tablet 2   No facility-administered medications prior to visit.      Allergies:   Lorazepam and Orange fruit [citrus]   Social History   Social History  . Marital status: Married    Spouse name: N/A  . Number of children: 0  . Years of education: N/A   Social History Main Topics  . Smoking status: Never Smoker  . Smokeless tobacco: Never Used  . Alcohol use No  . Drug use: No  . Sexual activity: Yes   Other Topics Concern  . None   Social History Narrative  . None     Family History:  The patient's family history includes Breast cancer in  her cousin; Colon polyps in her father; Diabetes in her brother, father, maternal grandmother, mother, and sister; Heart attack in her father and mother; Heart disease in her brother, father, mother, and sister; Hypertension in her brother, father, maternal grandmother, mother, and sister; Stroke in her father.   ROS:   Please see the history of present illness.    ROS All other systems reviewed and are negative.   PHYSICAL EXAM:   VS:  BP (!) 144/76   Pulse 80   Ht _0  (1.6 m)   Wt 287 lb (130.2 kg)   LMP 04/02/2012   BMI 50.84 kg/m    GEN: Well nourished, well developed, in no acute distress. She does have mild tachypnea at rest . Super obese. HEENT: normal  Neck: Obesity precludes visualization of JVD, carotid bruits, or masses Cardiac: RRR; no murmurs, rubs, or gallops,symmetrical 3+ ankle and pedal edema  Respiratory:  clear to auscultation bilaterally, normal work of breathing GI: soft, nontender, nondistended, + BS MS: no deformity or atrophy  Skin: warm and dry, no rash Neuro:  Alert and Oriented x 3, Strength and sensation are intact. Left eye blindness, poor vision right eye. Psych: euthymic mood, full affect  Wt Readings from Last 3 Encounters:  12/23/16 287 lb (130.2 kg)  11/26/16 278 lb (126.1 kg)  09/28/16 272 lb 12.8 oz (123.7 kg)      Studies/Labs Reviewed:   EKG:  EKG is not ordered today.    Recent Labs: 09/14/2016: ALT 13; Brain Natriuretic Peptide 6.2; Hemoglobin 14.0; Platelets 252 09/28/2016: BUN 12; Creat 1.30; Potassium 5.2; Sodium 138   Lipid Panel    Component Value Date/Time   CHOL 103 10/29/2015 1012   TRIG 75 10/29/2015 1012   HDL 40 10/29/2015 1012   CHOLHDL 2.6 10/29/2015 1012   CHOLHDL 2.9 09/12/2015 1147   VLDL 12 09/12/2015 1147  LDLCALC 48 10/29/2015 1012    ASSESSMENT:    1. Medication management   2. Chronic diastolic heart failure, NYHA class 3 ([place])   3. Chronic renal insufficiency, stage III (moderate)   4.  Atherosclerosis of native coronary artery of native heart with stable angina pectoris ([place])   5. Controlled type 2 diabetes mellitus with complication, with long-term current use of insulin ([place])   6. Dyslipidemia   7. Super obesity      PLAN:  In order of problems listed above:  1. CHF: NYHA class 3, Worsening signs of hypervolemia on exam. Losartan was added 2 weeks ago. We'll check labs today. If creatinine is stable at around 1.3 will give her a single dose of metolazone for relief of hypervolemia. The target would not be to achieve perfect "dry weight" but just to alleviate the uncomfortable swelling and shortness of breath. 2. CKD stg. 3: Recently creatinine has been around 1.3. Potassium was normal at 4.2 on May 10. She questions the diagnosis of anemia. Her hemoglobin is 13.8 in April.  3. CAD: due to diffuse nature of disease, not a candidate for either surgical or percutaneous revascularization. Usually CCS class II on 4 different antianginal drugs, had 1 brief episode of angina a week ago. It does not sound much she has an acute coronary syndrome.. 4. DM: Has had fewer episodes of hypoglycemia after some relaxation and glucose control. A1c has increased from 6.1 to 6.8%, still acceptable. 5. HLP: Good lipid profile on current medications   Medication Adjustments/Labs and Tests Ordered: Current medicines are reviewed at length with the patient today.  Concerns regarding medicines are outlined above.  Medication changes, Labs and Tests ordered today are listed in the Patient Instructions below. Patient Instructions  Medication Instructions: Dr Sallyanne Kuster has recommended making the following medication changes: 1. TAKE Metolazone 2.5 mg when instructed >Please do not take this medication until you receive the results of your lab work  Labwork: Your physician recommends that you return for lab work TODAY.  Testing/Procedures: NONE ORDERED  Follow-up: Dr Sallyanne Kuster recommends that  you schedule a follow-up appointment in 6 months. You will receive a reminder letter in the mail two months in advance. If you don't receive a letter, please call our office to schedule the follow-up appointment.  If you need a refill on your cardiac medications before your next appointment, please call your pharmacy.      Signed, Elizabeth Klein, MD  12/23/2016 9:14 AM    [facility] [place], [place], [place]  [address] Phone: [phone]; Fax: [phone]

## 2016-12-24 ENCOUNTER — Telehealth: Payer: Self-pay | Admitting: Cardiovascular Disease

## 2016-12-24 LAB — CBC
Hematocrit: 38.9 % (ref 34.0–46.6)
Hemoglobin: 12.2 g/dL (ref 11.1–15.9)
MCH: 29.8 pg (ref 26.6–33.0)
MCHC: 31.4 g/dL — AB (ref 31.5–35.7)
MCV: 95 fL (ref 79–97)
Platelets: 295 10*3/uL (ref 150–379)
RBC: 4.09 x10E6/uL (ref 3.77–5.28)
RDW: 13.3 % (ref 12.3–15.4)
WBC: 10.9 10*3/uL — AB (ref 3.4–10.8)

## 2016-12-24 LAB — BASIC METABOLIC PANEL
BUN/Creatinine Ratio: 13 (ref 12–28)
BUN: 15 mg/dL (ref 8–27)
CALCIUM: 9.1 mg/dL (ref 8.7–10.3)
CHLORIDE: 102 mmol/L (ref 96–106)
CO2: 23 mmol/L (ref 18–29)
Creatinine, Ser: 1.16 mg/dL — ABNORMAL HIGH (ref 0.57–1.00)
GFR calc Af Amer: 58 mL/min/{1.73_m2} — ABNORMAL LOW (ref >59)
GFR calc non Af Amer: 51 mL/min/{1.73_m2} — ABNORMAL LOW (ref >59)
Glucose: 291 mg/dL — ABNORMAL HIGH (ref 65–99)
POTASSIUM: 4.4 mmol/L (ref 3.5–5.2)
SODIUM: 139 mmol/L (ref 134–144)

## 2016-12-24 NOTE — Telephone Encounter (Signed)
Notes recorded by Sanda Klein, MD on 12/23/2016 at 5:43 PM EDT Kidney function is even better than it was when she saw Dr. Joelyn Oms, before starting the losartan. It's actually at the best level this year. Please take the metolazone 30 minutes before her morning dose of Lasix for just 1 dose. We can repeat this intermittently as needed, but need to be careful not to overdo it.  Would like her to lose about 10 pounds from today's weight to help her breathing and swelling. Can achieve this gradually over a week or 2.   Patient aware of results and recommendations.  Verbalized understanding.  Will call with further question/concerns or if symptoms worsen.

## 2016-12-24 NOTE — Telephone Encounter (Signed)
°  Follow Up  Returning call from yesterday regarding lab results. Please call.

## 2016-12-24 NOTE — Telephone Encounter (Signed)
Labs completed yesterday, 12/23/16, at office visit. Results already communicated with patient.

## 2016-12-27 ENCOUNTER — Encounter: Payer: Self-pay | Admitting: Family Medicine

## 2016-12-27 NOTE — Progress Notes (Signed)
Patient yearly nephrology visit  12/10/16 Started on losartan 25 mg daily for elevated BP 159/76 Cr 1.31 GFR 51

## 2016-12-28 ENCOUNTER — Encounter: Payer: Self-pay | Admitting: Family Medicine

## 2016-12-29 ENCOUNTER — Other Ambulatory Visit: Payer: Self-pay | Admitting: Family Medicine

## 2016-12-29 DIAGNOSIS — I5032 Chronic diastolic (congestive) heart failure: Secondary | ICD-10-CM

## 2017-01-03 ENCOUNTER — Other Ambulatory Visit: Payer: Self-pay | Admitting: Pharmacist

## 2017-01-03 DIAGNOSIS — I5032 Chronic diastolic (congestive) heart failure: Secondary | ICD-10-CM

## 2017-01-03 MED ORDER — POTASSIUM CHLORIDE CRYS ER 20 MEQ PO TBCR: 20.0000 meq | EXTENDED_RELEASE_TABLET | Freq: Two times a day (BID) | ORAL | 2 refills | Status: DC

## 2017-01-04 ENCOUNTER — Encounter: Payer: Self-pay | Admitting: Family Medicine

## 2017-01-04 ENCOUNTER — Other Ambulatory Visit: Payer: Self-pay | Admitting: Family Medicine

## 2017-01-04 ENCOUNTER — Ambulatory Visit: Payer: Medicare Other | Attending: Family Medicine | Admitting: Family Medicine

## 2017-01-04 VITALS — BP 148/75 | HR 66 | Temp 98.2°F | Wt 287.4 lb

## 2017-01-04 DIAGNOSIS — M25561 Pain in right knee: Secondary | ICD-10-CM | POA: Insufficient documentation

## 2017-01-04 DIAGNOSIS — Z794 Long term (current) use of insulin: Secondary | ICD-10-CM | POA: Diagnosis not present

## 2017-01-04 DIAGNOSIS — E118 Type 2 diabetes mellitus with unspecified complications: Secondary | ICD-10-CM | POA: Diagnosis not present

## 2017-01-04 DIAGNOSIS — K3184 Gastroparesis: Secondary | ICD-10-CM | POA: Insufficient documentation

## 2017-01-04 DIAGNOSIS — E1143 Type 2 diabetes mellitus with diabetic autonomic (poly)neuropathy: Secondary | ICD-10-CM | POA: Insufficient documentation

## 2017-01-04 DIAGNOSIS — Z7982 Long term (current) use of aspirin: Secondary | ICD-10-CM | POA: Diagnosis not present

## 2017-01-04 DIAGNOSIS — I10 Essential (primary) hypertension: Secondary | ICD-10-CM

## 2017-01-04 DIAGNOSIS — E11319 Type 2 diabetes mellitus with unspecified diabetic retinopathy without macular edema: Secondary | ICD-10-CM | POA: Insufficient documentation

## 2017-01-04 DIAGNOSIS — E669 Obesity, unspecified: Secondary | ICD-10-CM | POA: Diagnosis not present

## 2017-01-04 DIAGNOSIS — Z7902 Long term (current) use of antithrombotics/antiplatelets: Secondary | ICD-10-CM | POA: Diagnosis not present

## 2017-01-04 DIAGNOSIS — I5032 Chronic diastolic (congestive) heart failure: Secondary | ICD-10-CM | POA: Insufficient documentation

## 2017-01-04 DIAGNOSIS — Z79899 Other long term (current) drug therapy: Secondary | ICD-10-CM | POA: Insufficient documentation

## 2017-01-04 DIAGNOSIS — I110 Hypertensive heart disease with heart failure: Secondary | ICD-10-CM | POA: Diagnosis not present

## 2017-01-04 DIAGNOSIS — Z6841 Body Mass Index (BMI) 40.0 and over, adult: Secondary | ICD-10-CM | POA: Insufficient documentation

## 2017-01-04 LAB — GLUCOSE, POCT (MANUAL RESULT ENTRY): POC Glucose: 102 mg/dl — AB (ref 70–99)

## 2017-01-04 MED ORDER — FUROSEMIDE 80 MG PO TABS: 80.0000 mg | ORAL_TABLET | Freq: Two times a day (BID) | ORAL | 5 refills | Status: DC

## 2017-01-04 MED ORDER — METOCLOPRAMIDE HCL 5 MG PO TABS: 5.0000 mg | ORAL_TABLET | Freq: Three times a day (TID) | ORAL | 5 refills | Status: DC

## 2017-01-04 MED ORDER — POTASSIUM CHLORIDE CRYS ER 20 MEQ PO TBCR: 20.0000 meq | EXTENDED_RELEASE_TABLET | Freq: Every day | ORAL | 5 refills | Status: DC

## 2017-01-04 NOTE — Patient Instructions (Addendum)
Homer was seen today for hypertension.  Diagnoses and all orders for this visit:  Controlled type 2 diabetes mellitus with complication, with long-term current use of insulin (HCC) -     POCT glucose (manual entry)  Chronic diastolic heart failure, NYHA class 3 (HCC) -     potassium chloride SA (K-DUR,KLOR-CON) 20 MEQ tablet; Take 1 tablet (20 mEq total) by mouth daily. -     furosemide (LASIX) 80 MG tablet; Take 1 tablet (80 mg total) by mouth 2 (two) times daily.  Diabetic gastroparesis associated with type 2 diabetes mellitus (HCC) -     metoCLOPramide (REGLAN) 5 MG tablet; Take 1 tablet (5 mg total) by mouth 3 (three) times daily before meals.   F/u in 2 months for HTN   Dr. Adrian Blackwater

## 2017-01-04 NOTE — Assessment & Plan Note (Signed)
Improved with Reglan, continue 5 mg TID

## 2017-01-04 NOTE — Assessment & Plan Note (Addendum)
A; improved with addition of losartan 25 mg daily  Med: compliant P: Continue regimen, repeat BMP with GFR and increase to losartan 50 mg daily as tolerated

## 2017-01-04 NOTE — Progress Notes (Signed)
Subjective:  Patient ID: [name], female    DOB: 02/07/1956  Age: 61 y.o. MRN: [id number]  CC: Hypertension and Diabetes   HPI Abbagayle M Worthington has diabetes with complications (retinopathy, gastroparesis, renal insufficiency), HTN, CAD s/p NSTEMI, chronic diastolic heart failure, obesity she presents for     1. Diabetic gastroparesis: she has nausea at baseline before meals. She previously took zofran. She has been on trial of reglan 5 mg TID for suspected gastroparesis. She was referred to GI for screening colonoscopy but declined appointment citing heart disease and diabetes as limiting factors.    2. CHRONIC HYPERTENSION  Disease Monitoring  Blood pressure range: not checking at home  Chest pain: no   Dyspnea: no   Claudication: no   Medication compliance: yes, her nephrologist started losartan 25 mg daily last month.  Medication Side Effects  Lightheadedness: no   Urinary frequency: yes, on lasix    Edema: yes   Low salt: yes     3. R knee pain: improved following steroid injection. She still has some anterior knee pain. Pain level is 3/10. She does not take oral NSAID du to renal insufficiency. She is obese. No exercise. She denies trauma to her knee. She walks with a cane.   Social History  Substance Use Topics  . Smoking status: Never Smoker  . Smokeless tobacco: Never Used  . Alcohol use No    Outpatient Medications Prior to Visit  Medication Sig Dispense Refill  . ACCU-CHEK SOFTCLIX LANCETS lancets Use as instructed for 3 times daily blood glucose monitoring 100 each 5  . acetaminophen (TYLENOL) 500 MG tablet Take 1 tablet (500 mg total) by mouth every 6 (six) hours as needed. 30 tablet 0  . acetaZOLAMIDE (DIAMOX) 500 MG capsule Take 500 mg by mouth 2 (two) times daily.    Marland Kitchen aspirin 81 MG tablet Take 81 mg by mouth daily.    Marland Kitchen atorvastatin (LIPITOR) 80 MG tablet TAKE ONE TABLET BY MOUTH IN THE EVENING AT  6PM 90 tablet 3  . baclofen (LIORESAL) 10 MG  tablet Take 1 tablet (10 mg total) by mouth at bedtime as needed for muscle spasms. 30 each 5  . bismuth subsalicylate (PEPTO BISMOL) 262 MG/15ML suspension Take 30 mLs by mouth every 6 (six) hours as needed for indigestion.     . Blood Glucose Monitoring Suppl (ACCU-CHEK AVIVA PLUS) w/Device KIT Used as directed 1 kit 0  . brimonidine (ALPHAGAN) 0.2 % ophthalmic solution Place 1 drop into both eyes every 12 (twelve) hours. 5 mL 12  . brimonidine-timolol (COMBIGAN) 0.2-0.5 % ophthalmic solution Place 1 drop into both eyes every 12 (twelve) hours.    . calcium carbonate (TUMS - DOSED IN MG ELEMENTAL CALCIUM) 500 MG chewable tablet Chew 1 tablet by mouth 2 (two) times daily as needed for indigestion.     . clopidogrel (PLAVIX) 75 MG tablet Take 1 tablet (75 mg total) by mouth daily. 90 tablet 2  . diltiazem (DILACOR XR) 240 MG 24 hr capsule TAKE ONE CAPSULE BY MOUTH ONCE DAILY 90 capsule 3  . ferrous sulfate 325 (65 FE) MG tablet Take 325 mg by mouth daily with breakfast.    . furosemide (LASIX) 80 MG tablet Take 1 tablet (80 mg total) by mouth 2 (two) times daily. 60 tablet 2  . glucose blood test strip Use TID before meals / Dx E11.9 100 each 12  . glucose monitoring kit (FREESTYLE) monitoring kit 1 each by Does not  apply route 4 (four) times daily - after meals and at bedtime. 1 each 1  . hydrocortisone cream 1 % Apply 1 application topically daily as needed for itching.    . insulin lispro protamine-lispro (HUMALOG MIX 75/25) (75-25) 100 UNIT/ML SUSP injection Inject 50 Units into the skin 2 (two) times daily with a meal. 40 mL 5  . Insulin Syringe-Needle U-100 (RELION INSULIN SYR 1CC/30G) 30G X 5/16" 1 ML MISC 1 each by Other route 2 (two) times daily. USE AS DIRECTED 200 each 3  . Insulin Syringe-Needle U-100 (RELION INSULIN SYRINGE) 31G X 15/64" 1 ML MISC Use as directed for twice daily insulin adminstration. E11.9 100 each 5  . isosorbide mononitrate (IMDUR) 120 MG 24 hr tablet Take 1 tablet  (120 mg total) by mouth daily. 30 tablet 5  . losartan (COZAAR) 25 MG tablet Take 25 mg by mouth daily.    Marland Kitchen LUMIGAN 0.01 % SOLN Place 1 drop into both eyes 2 (two) times daily.    . Menthol-Methyl Salicylate (MUSCLE RUB EX) Apply 1 application topically daily as needed (pain).    Marland Kitchen metoCLOPramide (REGLAN) 5 MG tablet Take 1 tablet (5 mg total) by mouth 3 (three) times daily before meals. 90 tablet 2  . metolazone (ZAROXOLYN) 2.5 MG tablet Take 1 tablet (2.5 mg total) by mouth as directed. 10 tablet 3  . metoprolol (TOPROL-XL) 200 MG 24 hr tablet TAKE ONE TABLET BY MOUTH ONCE DAILY 90 tablet 0  . NITROSTAT 0.4 MG SL tablet DISSOLVE 1 TABLET UNDER THE TONGUE EVERY 5 MINUTES AS NEEDED FOR CHEST PAIN. DO NOT EXCEED A TOTAL OF 3 DOSES IN 15 MINUTES 25 tablet 3  . pantoprazole (PROTONIX) 40 MG tablet TAKE ONE TABLET BY MOUTH ONCE DAILY 30 tablet 2  . polyethylene glycol (MIRALAX / GLYCOLAX) packet Take 17 g by mouth daily as needed for mild constipation.    . polyvinyl alcohol (LIQUIFILM TEARS) 1.4 % ophthalmic solution Place 1 drop into both eyes daily as needed. For dry eyes    . potassium chloride SA (K-DUR,KLOR-CON) 20 MEQ tablet Take 1 tablet (20 mEq total) by mouth 2 (two) times daily. 60 tablet 2  . ranolazine (RANEXA) 500 MG 12 hr tablet Take 1 tablet (500 mg total) by mouth 2 (two) times daily. 60 tablet 2  . timolol (TIMOPTIC) 0.5 % ophthalmic solution Place 1 drop into both eyes every 12 (twelve) hours. 10 mL 12  . vitamin B-12 (CYANOCOBALAMIN) 100 MCG tablet Take 1 tablet (100 mcg total) by mouth daily. 90 tablet 3   No facility-administered medications prior to visit.     ROS Review of Systems  Constitutional: Negative for chills and fever.  Eyes: Positive for pain (R eye pain ) and visual disturbance (R eye).  Respiratory: Negative for shortness of breath.   Cardiovascular: Negative for chest pain.  Gastrointestinal: Positive for constipation. Negative for abdominal pain, blood in  stool and nausea.  Musculoskeletal: Positive for arthralgias (R knee pain x 1 month). Negative for back pain.  Skin: Negative for rash.  Allergic/Immunologic: Negative for immunocompromised state.  Neurological: Positive for numbness (in hands for 5 years ).  Hematological: Negative for adenopathy. Does not bruise/bleed easily.  Psychiatric/Behavioral: Negative for dysphoric mood and suicidal ideas.    Objective:  BP (!) 148/75   Pulse 66   Temp 98.2 F (36.8 C) (Oral)   Wt 287 lb 6.4 oz (130.4 kg)   LMP 04/02/2012   SpO2 94%   BMI  50.91 kg/m   BP/Weight 01/04/2017 12/23/2016 03/21/8137  Systolic BP 871 959 747  Diastolic BP 75 76 84  Wt. (Lbs) 287.4 287 278  BMI 50.91 50.84 49.25   Wt Readings from Last 3 Encounters:  01/04/17 287 lb 6.4 oz (130.4 kg)  12/23/16 287 lb (130.2 kg)  11/26/16 278 lb (126.1 kg)    Physical Exam  Constitutional: She is oriented to person, place, and time. She appears well-developed and well-nourished. No distress.  Obese  Walks with a cane   HENT:  Head: Normocephalic and atraumatic.  Eyes: EOM and lids are normal.    Cardiovascular: Normal rate, regular rhythm, normal heart sounds and intact distal pulses.   Pulmonary/Chest: Effort normal and breath sounds normal.  Musculoskeletal: She exhibits edema (trace).  Neurological: She is alert and oriented to person, place, and time.  Skin: Skin is warm and dry. No rash noted.  Psychiatric: She has a normal mood and affect.    Lab Results  Component Value Date   HGBA1C 6.8 11/26/2016   CBG 102  Assessment & Plan:  Linde was seen today for hypertension and diabetes.  Diagnoses and all orders for this visit:  Controlled type 2 diabetes mellitus with complication, with long-term current use of insulin (HCC) -     POCT glucose (manual entry)  Chronic diastolic heart failure, NYHA class 3 (HCC) -     potassium chloride SA (K-DUR,KLOR-CON) 20 MEQ tablet; Take 1 tablet (20 mEq total) by  mouth daily. -     furosemide (LASIX) 80 MG tablet; Take 1 tablet (80 mg total) by mouth 2 (two) times daily.  Diabetic gastroparesis associated with type 2 diabetes mellitus (HCC) -     metoCLOPramide (REGLAN) 5 MG tablet; Take 1 tablet (5 mg total) by mouth 3 (three) times daily before meals.   There are no diagnoses linked to this encounter.  No orders of the defined types were placed in this encounter.   Follow-up: Return in about 2 months (around 03/06/2017) for HTN.   Boykin Nearing MD

## 2017-02-21 ENCOUNTER — Other Ambulatory Visit: Payer: Self-pay | Admitting: Family Medicine

## 2017-02-21 DIAGNOSIS — Z1211 Encounter for screening for malignant neoplasm of colon: Secondary | ICD-10-CM | POA: Diagnosis not present

## 2017-02-21 DIAGNOSIS — I214 Non-ST elevation (NSTEMI) myocardial infarction: Secondary | ICD-10-CM

## 2017-03-01 LAB — COLOGUARD: Cologuard: POSITIVE

## 2017-03-02 ENCOUNTER — Telehealth: Payer: Self-pay | Admitting: Family Medicine

## 2017-03-02 NOTE — Telephone Encounter (Signed)
Results received Will call patient and refer to GI for f/u colonoscopy

## 2017-03-02 NOTE — Telephone Encounter (Signed)
Rep from [employer] called to confirm if we had received abnormal cologuard test report. Test results are positive. If we did not receive report or any questions call [phone] option 1 and then option 2 provide services. PT ID [id number]

## 2017-03-02 NOTE — Telephone Encounter (Signed)
Will route to PCP 

## 2017-03-03 ENCOUNTER — Other Ambulatory Visit: Payer: Self-pay | Admitting: Cardiovascular Disease

## 2017-03-07 ENCOUNTER — Ambulatory Visit: Payer: Medicare Other | Attending: Family Medicine | Admitting: Family Medicine

## 2017-03-07 ENCOUNTER — Encounter: Payer: Self-pay | Admitting: Family Medicine

## 2017-03-07 VITALS — BP 150/70 | HR 74 | Temp 98.4°F | Ht 63.0 in | Wt 291.0 lb

## 2017-03-07 DIAGNOSIS — I2510 Atherosclerotic heart disease of native coronary artery without angina pectoris: Secondary | ICD-10-CM | POA: Diagnosis not present

## 2017-03-07 DIAGNOSIS — N183 Chronic kidney disease, stage 3 unspecified: Secondary | ICD-10-CM

## 2017-03-07 DIAGNOSIS — Z7982 Long term (current) use of aspirin: Secondary | ICD-10-CM | POA: Insufficient documentation

## 2017-03-07 DIAGNOSIS — M545 Low back pain, unspecified: Secondary | ICD-10-CM

## 2017-03-07 DIAGNOSIS — E118 Type 2 diabetes mellitus with unspecified complications: Secondary | ICD-10-CM | POA: Diagnosis not present

## 2017-03-07 DIAGNOSIS — Z6841 Body Mass Index (BMI) 40.0 and over, adult: Secondary | ICD-10-CM | POA: Diagnosis not present

## 2017-03-07 DIAGNOSIS — E11319 Type 2 diabetes mellitus with unspecified diabetic retinopathy without macular edema: Secondary | ICD-10-CM | POA: Diagnosis not present

## 2017-03-07 DIAGNOSIS — I252 Old myocardial infarction: Secondary | ICD-10-CM | POA: Diagnosis not present

## 2017-03-07 DIAGNOSIS — M25561 Pain in right knee: Secondary | ICD-10-CM | POA: Insufficient documentation

## 2017-03-07 DIAGNOSIS — Z794 Long term (current) use of insulin: Secondary | ICD-10-CM | POA: Diagnosis not present

## 2017-03-07 DIAGNOSIS — G8929 Other chronic pain: Secondary | ICD-10-CM | POA: Insufficient documentation

## 2017-03-07 DIAGNOSIS — E669 Obesity, unspecified: Secondary | ICD-10-CM | POA: Insufficient documentation

## 2017-03-07 DIAGNOSIS — E1143 Type 2 diabetes mellitus with diabetic autonomic (poly)neuropathy: Secondary | ICD-10-CM | POA: Diagnosis not present

## 2017-03-07 DIAGNOSIS — Z79899 Other long term (current) drug therapy: Secondary | ICD-10-CM | POA: Diagnosis not present

## 2017-03-07 DIAGNOSIS — N289 Disorder of kidney and ureter, unspecified: Secondary | ICD-10-CM | POA: Insufficient documentation

## 2017-03-07 DIAGNOSIS — I110 Hypertensive heart disease with heart failure: Secondary | ICD-10-CM | POA: Insufficient documentation

## 2017-03-07 DIAGNOSIS — Z7902 Long term (current) use of antithrombotics/antiplatelets: Secondary | ICD-10-CM | POA: Insufficient documentation

## 2017-03-07 DIAGNOSIS — I10 Essential (primary) hypertension: Secondary | ICD-10-CM | POA: Diagnosis not present

## 2017-03-07 DIAGNOSIS — I5032 Chronic diastolic (congestive) heart failure: Secondary | ICD-10-CM | POA: Insufficient documentation

## 2017-03-07 DIAGNOSIS — K3184 Gastroparesis: Secondary | ICD-10-CM | POA: Diagnosis not present

## 2017-03-07 DIAGNOSIS — R195 Other fecal abnormalities: Secondary | ICD-10-CM | POA: Diagnosis not present

## 2017-03-07 LAB — GLUCOSE, POCT (MANUAL RESULT ENTRY): POC Glucose: 162 mg/dl — AB (ref 70–99)

## 2017-03-07 LAB — POCT GLYCOSYLATED HEMOGLOBIN (HGB A1C): HEMOGLOBIN A1C: 6.8

## 2017-03-07 MED ORDER — FUROSEMIDE 80 MG PO TABS: 80.0000 mg | ORAL_TABLET | Freq: Two times a day (BID) | ORAL | 5 refills | Status: DC

## 2017-03-07 MED ORDER — BLOOD PRESSURE MONITOR/ARM DEVI: 1.0000 | Freq: Every day | 0 refills | Status: AC

## 2017-03-07 NOTE — Assessment & Plan Note (Signed)
Chronic pain in obese female Suspect arthritic pain  Plan: Ortho referral placed Tylenol 1000 mg TID prn pain Weight loss recommended Handicap parking placard form completed

## 2017-03-07 NOTE — Patient Instructions (Addendum)
[name] was seen today for hypertension and diabetes.  Diagnoses and all orders for this visit:  Controlled type 2 diabetes mellitus with complication, with long-term current use of insulin (HCC) -     POCT glucose (manual entry) -     POCT glycosylated hemoglobin (Hb A1C)  Positive colorectal cancer screening using Cologuard test -     Ambulatory referral to Gastroenterology  Chronic bilateral low back pain without sciatica -     Ambulatory referral to Orthopedic Surgery -     DG Lumbar Spine 2-3 Views; Future  Chronic pain of right knee -     Ambulatory referral to Orthopedic Surgery -     DG Knee AP/LAT W/Sunrise Right; Future  Chronic diastolic heart failure, NYHA class 3 (HCC) -     furosemide (LASIX) 80 MG tablet; Take 1 tablet (80 mg total) by mouth 2 (two) times daily. -     BMP8+EGFR -     Blood Pressure Monitoring (BLOOD PRESSURE MONITOR/ARM) DEVI; 1 each by Does not apply route daily. ICD 10, I10 and I50.32  Essential hypertension -     furosemide (LASIX) 80 MG tablet; Take 1 tablet (80 mg total) by mouth 2 (two) times daily. -     BMP8+EGFR -     Blood Pressure Monitoring (BLOOD PRESSURE MONITOR/ARM) DEVI; 1 each by Does not apply route daily. ICD 10, I10 and I50.32   Please complete ordered x-rays You have been referred to orthopaedic for knee and back pain.  You are advised to take [phone] mg of tylenol up to 3 times daily for pain  Referred to gastroenterology for positive cologard test  Labs today to check kidney function   Please work on weight reduction via healthy diet and as much activity as possible to improve breathing and reduce swelling   F/u in 8 weeks for HTN and flu shot   Dr. Adrian Blackwater

## 2017-03-07 NOTE — Assessment & Plan Note (Signed)
Positive cologuard test Referral to GI placed

## 2017-03-07 NOTE — Assessment & Plan Note (Signed)
Remains well controlled Continue current regimen

## 2017-03-07 NOTE — Progress Notes (Signed)
Subjective:  Patient ID: [name], female    DOB: Nov 03, 1955  Age: 61 y.o. MRN: [id number]  CC: Hypertension and Diabetes   HPI [name] has diabetes with complications (retinopathy, gastroparesis, renal insufficiency), HTN, CAD s/p NSTEMI, chronic diastolic heart failure, obesity she presents for     1. Diabetes: compliant with regimen. Denies low sugars < 70. Denies hyperglycemia < 200. Report Reglan has helped immensely with abdominal bloating caused by suspected gastroparesis. She does not exercise stating she rather go to the yard and eat grass.   2. CHRONIC HYPERTENSION  Disease Monitoring  Blood pressure range: not checking at home  Chest pain: no   Dyspnea: no   Claudication: no   Medication compliance: yes Medication Side Effects  Lightheadedness: no   Urinary frequency: yes, on lasix    Edema: yes   Low salt: yes     3. Chronic pain: in low back and R knee. Since 2015. Reports working long hours on feet. Working Scientist, research (medical) at [employer]. Takes tylenol 500 mg daily. Uses a cane. Uses a scooter while in stores. Request her own scooter. Obese and does not exercise.   4. Positive cologard: she completed cologard for colorectal cancer screening on . Her test result was positive. She   Social History  Substance Use Topics  . Smoking status: Never Smoker  . Smokeless tobacco: Never Used  . Alcohol use No    Outpatient Medications Prior to Visit  Medication Sig Dispense Refill  . ACCU-CHEK SOFTCLIX LANCETS lancets Use as instructed for 3 times daily blood glucose monitoring 100 each 5  . acetaminophen (TYLENOL) 500 MG tablet Take 1 tablet (500 mg total) by mouth every 6 (six) hours as needed. 30 tablet 0  . acetaZOLAMIDE (DIAMOX) 500 MG capsule Take 500 mg by mouth 2 (two) times daily.    Marland Kitchen aspirin 81 MG tablet Take 81 mg by mouth daily.    Marland Kitchen atorvastatin (LIPITOR) 80 MG tablet TAKE ONE TABLET BY MOUTH IN THE EVENING AT  6PM 90 tablet 3  . baclofen  (LIORESAL) 10 MG tablet Take 1 tablet (10 mg total) by mouth at bedtime as needed for muscle spasms. 30 each 5  . Blood Glucose Monitoring Suppl (ACCU-CHEK AVIVA PLUS) w/Device KIT Used as directed 1 kit 0  . brimonidine (ALPHAGAN) 0.2 % ophthalmic solution Place 1 drop into both eyes every 12 (twelve) hours. 5 mL 12  . brimonidine-timolol (COMBIGAN) 0.2-0.5 % ophthalmic solution Place 1 drop into both eyes every 12 (twelve) hours.    . clopidogrel (PLAVIX) 75 MG tablet Take 1 tablet (75 mg total) by mouth daily. 90 tablet 2  . diltiazem (DILACOR XR) 240 MG 24 hr capsule TAKE ONE CAPSULE BY MOUTH ONCE DAILY 90 capsule 3  . ferrous sulfate 325 (65 FE) MG tablet Take 325 mg by mouth daily with breakfast.    . glucose blood test strip Use TID before meals / Dx E11.9 100 each 12  . glucose monitoring kit (FREESTYLE) monitoring kit 1 each by Does not apply route 4 (four) times daily - after meals and at bedtime. 1 each 1  . HUMALOG MIX 75/25 (75-25) 100 UNIT/ML SUSP injection INJECT 50 UNITS INTO THE SKIN 2 TIMES DAILY WITH A MEAL 40 mL 5  . hydrocortisone cream 1 % Apply 1 application topically daily as needed for itching.    . Insulin Syringe-Needle U-100 (RELION INSULIN SYR 1CC/30G) 30G X 5/16" 1 ML MISC 1 each by  Other route 2 (two) times daily. USE AS DIRECTED 200 each 3  . Insulin Syringe-Needle U-100 (RELION INSULIN SYRINGE) 31G X 15/64" 1 ML MISC Use as directed for twice daily insulin adminstration. E11.9 100 each 5  . isosorbide mononitrate (IMDUR) 120 MG 24 hr tablet TAKE 1 TABLET BY MOUTH ONCE DAILY 90 tablet 0  . losartan (COZAAR) 25 MG tablet Take 25 mg by mouth daily.    Marland Kitchen LUMIGAN 0.01 % SOLN Place 1 drop into both eyes 2 (two) times daily.    . Menthol-Methyl Salicylate (MUSCLE RUB EX) Apply 1 application topically daily as needed (pain).    Marland Kitchen metoCLOPramide (REGLAN) 5 MG tablet Take 1 tablet (5 mg total) by mouth 3 (three) times daily before meals. 90 tablet 5  . metolazone (ZAROXOLYN)  2.5 MG tablet Take 1 tablet (2.5 mg total) by mouth as directed. 10 tablet 3  . metoprolol (TOPROL-XL) 200 MG 24 hr tablet TAKE 1 TABLET BY MOUTH ONCE DAILY 90 tablet 0  . NITROSTAT 0.4 MG SL tablet DISSOLVE 1 TABLET UNDER THE TONGUE EVERY 5 MINUTES AS NEEDED FOR CHEST PAIN. DO NOT EXCEED A TOTAL OF 3 DOSES IN 15 MINUTES 25 tablet 3  . pantoprazole (PROTONIX) 40 MG tablet TAKE ONE TABLET BY MOUTH ONCE DAILY 30 tablet 2  . polyvinyl alcohol (LIQUIFILM TEARS) 1.4 % ophthalmic solution Place 1 drop into both eyes daily as needed. For dry eyes    . potassium chloride SA (K-DUR,KLOR-CON) 20 MEQ tablet Take 1 tablet (20 mEq total) by mouth daily. 60 tablet 5  . ranolazine (RANEXA) 500 MG 12 hr tablet Take 1 tablet (500 mg total) by mouth 2 (two) times daily. 60 tablet 2  . timolol (TIMOPTIC) 0.5 % ophthalmic solution Place 1 drop into both eyes every 12 (twelve) hours. 10 mL 12  . vitamin B-12 (CYANOCOBALAMIN) 100 MCG tablet Take 1 tablet (100 mcg total) by mouth daily. 90 tablet 3  . bismuth subsalicylate (PEPTO BISMOL) 262 MG/15ML suspension Take 30 mLs by mouth every 6 (six) hours as needed for indigestion.     . calcium carbonate (TUMS - DOSED IN MG ELEMENTAL CALCIUM) 500 MG chewable tablet Chew 1 tablet by mouth 2 (two) times daily as needed for indigestion.     . furosemide (LASIX) 80 MG tablet Take 1 tablet (80 mg total) by mouth 2 (two) times daily. 60 tablet 5  . polyethylene glycol (MIRALAX / GLYCOLAX) packet Take 17 g by mouth daily as needed for mild constipation.     No facility-administered medications prior to visit.     ROS Review of Systems  Constitutional: Negative for chills and fever.  Respiratory: Negative for shortness of breath.   Cardiovascular: Negative for chest pain.  Gastrointestinal: Negative for abdominal pain, blood in stool and nausea.  Musculoskeletal: Positive for arthralgias (R knee pain x 1 month) and back pain.  Skin: Negative for rash.  Allergic/Immunologic:  Negative for immunocompromised state.  Neurological: Positive for numbness (in hands for 5 years ).  Hematological: Negative for adenopathy. Does not bruise/bleed easily.  Psychiatric/Behavioral: Negative for dysphoric mood and suicidal ideas.    Objective:  BP (!) 150/70   Pulse 74   Temp 98.4 F (36.9 C) (Oral)   Ht '5\' 3"'  (1.6 m)   Wt 291 lb (132 kg)   LMP 04/02/2012   SpO2 99%   BMI 51.55 kg/m   BP/Weight 03/07/2017 01/04/2017 1/61/0960  Systolic BP 454 098 119  Diastolic BP 70 75  76  Wt. (Lbs) 291 287.4 287  BMI 51.55 50.91 50.84   Wt Readings from Last 3 Encounters:  03/07/17 291 lb (132 kg)  01/04/17 287 lb 6.4 oz (130.4 kg)  12/23/16 287 lb (130.2 kg)    Physical Exam  Constitutional: She is oriented to person, place, and time. She appears well-developed and well-nourished. No distress.  Obese  Walks with a cane   HENT:  Head: Normocephalic and atraumatic.  Eyes: EOM and lids are normal.  Cardiovascular: Normal rate, regular rhythm, normal heart sounds and intact distal pulses.   Pulmonary/Chest: Effort normal and breath sounds normal.  Musculoskeletal: She exhibits edema (trace).  Neurological: She is alert and oriented to person, place, and time.  Skin: Skin is warm and dry. No rash noted.  Psychiatric: She has a normal mood and affect.    Lab Results  Component Value Date   HGBA1C 6.8 03/07/2017   CBG 102  Assessment & Plan:  [name] was seen today for hypertension and diabetes.  Diagnoses and all orders for this visit:  Controlled type 2 diabetes mellitus with complication, with long-term current use of insulin (HCC) -     POCT glucose (manual entry) -     POCT glycosylated hemoglobin (Hb A1C)  Positive colorectal cancer screening using Cologuard test -     Ambulatory referral to Gastroenterology  Chronic bilateral low back pain without sciatica -     Ambulatory referral to Orthopedic Surgery -     DG Lumbar Spine 2-3 Views; Future  Chronic  pain of right knee -     Ambulatory referral to Orthopedic Surgery -     DG Knee AP/LAT W/Sunrise Right; Future  Chronic diastolic heart failure, NYHA class 3 (HCC) -     furosemide (LASIX) 80 MG tablet; Take 1 tablet (80 mg total) by mouth 2 (two) times daily. -     BMP8+EGFR -     Blood Pressure Monitoring (BLOOD PRESSURE MONITOR/ARM) DEVI; 1 each by Does not apply route daily. ICD 10, I10 and I50.32  Essential hypertension -     furosemide (LASIX) 80 MG tablet; Take 1 tablet (80 mg total) by mouth 2 (two) times daily. -     BMP8+EGFR -     Blood Pressure Monitoring (BLOOD PRESSURE MONITOR/ARM) DEVI; 1 each by Does not apply route daily. ICD 10, I10 and I50.32   There are no diagnoses linked to this encounter.  No orders of the defined types were placed in this encounter.   Follow-up: Return in about 8 weeks (around 05/02/2017) for HTN and flu shot .   Boykin Nearing MD

## 2017-03-07 NOTE — Assessment & Plan Note (Addendum)
Hypertensive obese female with chronic diastolic dysfunction Plan: Continue current regimen Checking BMP Consider increasing losartan 50 mg daily pending review of renal function  Metabolic panel with stable renal function Increase losartan dose to 50 mg daily to reduce BP and improve blood flow to kidneys  Please return for repeat metabolic panel in 2 weeks after increasing dose, order placed

## 2017-03-08 LAB — BMP8+EGFR
BUN/Creatinine Ratio: 12 (ref 12–28)
BUN: 15 mg/dL (ref 8–27)
CHLORIDE: 105 mmol/L (ref 96–106)
CO2: 23 mmol/L (ref 20–29)
CREATININE: 1.29 mg/dL — AB (ref 0.57–1.00)
Calcium: 9.2 mg/dL (ref 8.7–10.3)
GFR calc Af Amer: 51 mL/min/{1.73_m2} — ABNORMAL LOW (ref >59)
GFR calc non Af Amer: 44 mL/min/{1.73_m2} — ABNORMAL LOW (ref >59)
GLUCOSE: 97 mg/dL (ref 65–99)
Potassium: 3.8 mmol/L (ref 3.5–5.2)
SODIUM: 142 mmol/L (ref 134–144)

## 2017-03-09 ENCOUNTER — Telehealth: Payer: Self-pay | Admitting: Cardiovascular Disease

## 2017-03-09 NOTE — Telephone Encounter (Signed)
Patient just got Rx for metolazone - her insurance had not paid for her medication until now. She reports weight gain and feet swelling. She reports weight gain of about 10lbs in 2 months. No SOB. Explained she has had a more recent BMET by PCP on 8/6 and I will see if MD can review this for renal function and advise on parameters for when she should take metolazone.   Notes recorded by Sanda Klein, MD on 12/23/2016 at 5:43 PM EDT Kidney function is even better than it was when she saw Dr. Joelyn Oms, before starting the losartan. It's actually at the best level this year. Please take the metolazone 30 minutes before her morning dose of Lasix for just 1 dose. We can repeat this intermittently as needed, but need to be careful not to overdo it.  Would like her to lose about 10 pounds from today's weight to help her breathing and swelling. Can achieve this gradually over a week or 2.

## 2017-03-09 NOTE — Telephone Encounter (Signed)
nEW mESSAGE     How is patient suppose to be taking this medication  metolazone (ZAROXOLYN) 2.5 MG tablet Take 1 tablet (2.5 mg total) by mouth as directed.    she just got the prescription filled and forgets the instructions.

## 2017-03-10 MED ORDER — LOSARTAN POTASSIUM 50 MG PO TABS: 50.0000 mg | ORAL_TABLET | Freq: Every day | ORAL | 5 refills | Status: DC

## 2017-03-10 NOTE — Telephone Encounter (Addendum)
Patient called w/MD recommendations on med instructions. Patient voiced understanding. Advised patient to call office if she gains more than 3# in 24 hours or 5# in 1 week.   Of note, patient has not had acute weight gain and no SOB at this time

## 2017-03-10 NOTE — Addendum Note (Signed)
Addended by: Boykin Nearing on: 03/10/2017 10:54 AM   Modules accepted: Orders

## 2017-03-10 NOTE — Telephone Encounter (Signed)
This is supposed to used only as a very occasional backup for her usual diuretic for when she is >10 lb above her dry weight, in an attempt to avoid need for hospitalization. Should not take routinely, never more than once a week unless we instruct her to do so. If she does not have dyspnea currently and the weight did not increase recently, I would not take it at this time. MCr

## 2017-03-17 ENCOUNTER — Telehealth: Payer: Self-pay

## 2017-03-17 NOTE — Telephone Encounter (Signed)
Pt was called and a VM was left informing pt of lab results.

## 2017-03-18 ENCOUNTER — Telehealth: Payer: Self-pay | Admitting: Family Medicine

## 2017-03-18 NOTE — Telephone Encounter (Signed)
Pt. Returned nurse call regarding her lab results. Pt. States that she did not receive the whole VM that was left. Please f/u with pt.

## 2017-03-29 DIAGNOSIS — H401133 Primary open-angle glaucoma, bilateral, severe stage: Secondary | ICD-10-CM | POA: Diagnosis not present

## 2017-04-05 ENCOUNTER — Ambulatory Visit (INDEPENDENT_AMBULATORY_CARE_PROVIDER_SITE_OTHER): Payer: Medicare Other

## 2017-04-05 ENCOUNTER — Encounter (INDEPENDENT_AMBULATORY_CARE_PROVIDER_SITE_OTHER): Payer: Self-pay | Admitting: Orthopaedic Surgery

## 2017-04-05 ENCOUNTER — Ambulatory Visit (INDEPENDENT_AMBULATORY_CARE_PROVIDER_SITE_OTHER): Payer: Medicare Other | Admitting: Orthopaedic Surgery

## 2017-04-05 ENCOUNTER — Ambulatory Visit (INDEPENDENT_AMBULATORY_CARE_PROVIDER_SITE_OTHER): Payer: Self-pay

## 2017-04-05 DIAGNOSIS — M545 Low back pain: Secondary | ICD-10-CM | POA: Diagnosis not present

## 2017-04-05 DIAGNOSIS — M170 Bilateral primary osteoarthritis of knee: Secondary | ICD-10-CM

## 2017-04-05 DIAGNOSIS — G8929 Other chronic pain: Secondary | ICD-10-CM

## 2017-04-05 NOTE — Addendum Note (Signed)
Addended by: Precious Bard on: 04/05/2017 10:23 AM   Modules accepted: Orders

## 2017-04-05 NOTE — Progress Notes (Signed)
Office Visit Note   Patient: [name]           Date of Birth: [birth year]           MRN: [id number] Visit Date: 04/05/2017              Requested by: Boykin Nearing, MD [address] [place], [place] [address] PCP: Boykin Nearing, MD   Assessment & Plan: Visit Diagnoses:  1. Primary osteoarthritis of both knees   2. Chronic bilateral low back pain, with sciatica presence unspecified     Plan: For her knees patient has advanced degenerative joint disease that has failed conservative treatment. However she is morbidly obese and currently not a surgical candidate for knee replacement. For her back she has failed conservative treatment. We will order MRI of her lumbar spine to rule out structural abnormalities. We will call this patient with the results and likely referral to Dr. Ernestina Patches and if an epidural steroid injection is indicated. Total face to face encounter time was greater than 45 minutes and over half of this time was spent in counseling and/or coordination of care.  Follow-Up Instructions: Return if symptoms worsen or fail to improve.   Orders:  Orders Placed This Encounter  Procedures  . XR KNEE 3 VIEW RIGHT  . XR Lumbar Spine 2-3 Views  . XR KNEE 3 VIEW LEFT   No orders of the defined types were placed in this encounter.     Procedures: No procedures performed   Clinical Data: No additional findings.   Subjective: Chief Complaint  Patient presents with  . Left Knee - Pain  . Right Knee - Pain  . Lower Back - Pain    Patient is 61 year old female comes in with bilateral knee pain and low back pain with radiation in her right leg. She has been seeing her primary care doctor since February. She's had a cortisone injection without any significant relief. She continues to have severe pain. She has taken Tylenol and baclofen. Denies any numbness and tingling or constitutional symptoms    Review of Systems  Constitutional: Negative.   HENT:  Negative.   Eyes: Negative.   Respiratory: Negative.   Cardiovascular: Negative.   Endocrine: Negative.   Musculoskeletal: Negative.   Neurological: Negative.   Hematological: Negative.   Psychiatric/Behavioral: Negative.   All other systems reviewed and are negative.    Objective: Vital Signs: LMP 04/02/2012   Physical Exam  Constitutional: She is oriented to person, place, and time. She appears well-developed and well-nourished.  HENT:  Head: Normocephalic and atraumatic.  Eyes: EOM are normal.  Neck: Neck supple.  Pulmonary/Chest: Effort normal.  Abdominal: Soft.  Neurological: She is alert and oriented to person, place, and time.  Skin: Skin is warm. Capillary refill takes less than 2 seconds.  Psychiatric: She has a normal mood and affect. Her behavior is normal. Judgment and thought content normal.  Nursing note and vitals reviewed.   Ortho Exam Patient is morbidly obese with BMI greater than 50 Bilateral knee exam shows no joint effusion. Positive patellar crepitus. No focal findings in her lower extremities. Negative sciatic tension signs. Specialty Comments:  No specialty comments available.  Imaging: Xr Knee 3 View Left  Result Date: 04/05/2017 Advanced degenerative joint disease.   Xr Knee 3 View Right  Result Date: 04/05/2017 Advanced degenerative joint disease  Xr Lumbar Spine 2-3 Views  Result Date: 04/05/2017 Lumbar spondylosis without any significant degenerative disc disease    PMFS  History: Patient Active Problem List   Diagnosis Date Noted  . Primary osteoarthritis of both knees 04/05/2017  . Chronic bilateral low back pain 03/07/2017  . Chronic pain of right knee 03/07/2017  . Positive colorectal cancer screening using Cologuard test 03/07/2017  . Vitamin B12 deficiency 12/09/2016  . Bilateral hand numbness 11/26/2016  . Incidental lung nodule, > 41mm and < 72mm 09/14/2016  . Unstable angina pectoris ([place])   . Vitreous hemorrhage of right  eye ([place])   . Angina pectoris ([place]) 02/08/2016  . Diabetic gastroparesis associated with type 2 diabetes mellitus ([place]) 09/12/2015  . Controlled type 2 diabetes mellitus with complication, with long-term current use of insulin ([place]) 09/12/2015  . Chronic renal insufficiency, stage III (moderate) 03/19/2015  . Unstable angina ([place]) 03/18/2015  . Prolonged Q-T interval on ECG 08/10/2013  . Dyslipidemia 04/02/2013  . Chronic diastolic heart failure, NYHA class 3 ([place]) 04/07/2012  . Presumed OSA (obstructive sleep apnea) 04/06/2012  . NSTEMI  post-op 04/04/12-medical Rx 04/04/2012  . Obesity, morbid 01/17/2012  . GERD 08/21/2009  . DIZZINESS 08/21/2009  . Post-menopausal bleeding 03/20/2009  . PERIPHERAL EDEMA 03/20/2009  . Coronary atherosclerosis-not a surgical or PCI candidate 2010 02/27/2009  . SHOULDER PAIN, LEFT 10/20/2007  . Proliferative diabetic retinopathy ([place]) 10/07/2007  . DETACHED RETINA, BILATERAL, HX OF 09/07/2007  . History of chronic Iron defeicency anemia with acute post op anemia, transfused after debridment 04/04/12 04/17/2007  . Essential hypertension 04/17/2007   Past Medical History:  Diagnosis Date  . Anemia   . Blind right eye   . Chronic back pain    "my whole back" (03/18/2015)  . Chronic diastolic heart failure ([place]) 9/13   echo 03/20/15 LV Ef of 60-65%, grade 2DD and PA peak pressure: 36 mm Hg   . Coronary artery disease    Medical Rx  . Diabetic retinopathy ([place])   . GERD (gastroesophageal reflux disease)   . Headache   . History of blood transfusion "several"   "related to menopause"  . Hyperlipemia   . Hypertension   . Morbid obesity with BMI of 40.0-44.9, adult ([place])   . Myocardial infarction ([place]) "several"  . Neuropathy    hands    diabetic neuropthy  . Obstructive sleep apnea    "they say I do; I say I don't; no mask" (03/18/2015)  . Renal insufficiency   . Type II diabetes mellitus (H[facility])    insulin dependent    Family History  Problem  Relation Age of Onset  . Diabetes Mother   . Heart disease Mother   . Heart attack Mother   . Hypertension Mother   . Colon polyps Father   . Diabetes Father   . Heart disease Father   . Heart attack Father   . Stroke Father   . Hypertension Father   . Diabetes Brother   . Diabetes Sister   . Heart disease Brother   . Heart disease Sister   . Diabetes Maternal Grandmother   . Hypertension Maternal Grandmother   . Hypertension Brother   . Hypertension Sister   . Breast cancer Cousin     Past Surgical History:  Procedure Laterality Date  . CARDIAC CATHETERIZATION  July 2010  . CATARACT EXTRACTION, BILATERAL Bilateral   . DILATION AND CURETTAGE OF UTERUS    . EYE SURGERY Bilateral "several"  . INCISION AND DRAINAGE PERIRECTAL ABSCESS  04/03/2012   Social History   Occupational History  . Not on file.   Social  History Main Topics  . Smoking status: Never Smoker  . Smokeless tobacco: Never Used  . Alcohol use No  . Drug use: No  . Sexual activity: Yes

## 2017-04-16 ENCOUNTER — Other Ambulatory Visit: Payer: Self-pay | Admitting: Family Medicine

## 2017-04-16 DIAGNOSIS — I5032 Chronic diastolic (congestive) heart failure: Secondary | ICD-10-CM

## 2017-04-26 ENCOUNTER — Ambulatory Visit
Admission: RE | Admit: 2017-04-26 | Discharge: 2017-04-26 | Disposition: A | Discharge status: Home / Self Care | Payer: Medicare Other | Source: Ambulatory Visit | Attending: Orthopaedic Surgery | Admitting: Orthopaedic Surgery

## 2017-04-26 DIAGNOSIS — G8929 Other chronic pain: Secondary | ICD-10-CM

## 2017-04-26 DIAGNOSIS — M5126 Other intervertebral disc displacement, lumbar region: Secondary | ICD-10-CM | POA: Diagnosis not present

## 2017-04-26 DIAGNOSIS — M545 Low back pain: Principal | ICD-10-CM

## 2017-05-09 ENCOUNTER — Ambulatory Visit: Payer: Medicare Other | Admitting: Family Medicine

## 2017-06-01 ENCOUNTER — Other Ambulatory Visit: Payer: Self-pay | Admitting: Cardiovascular Disease

## 2017-06-01 NOTE — Telephone Encounter (Signed)
Rx has been sent to the pharmacy electronically. ° °

## 2017-06-27 ENCOUNTER — Other Ambulatory Visit: Payer: Self-pay | Admitting: Cardiovascular Disease

## 2017-06-27 NOTE — Telephone Encounter (Signed)
Rx(s) sent to pharmacy electronically.  

## 2017-07-07 DIAGNOSIS — H5711 Ocular pain, right eye: Secondary | ICD-10-CM | POA: Diagnosis not present

## 2017-07-08 DIAGNOSIS — H44511 Absolute glaucoma, right eye: Secondary | ICD-10-CM | POA: Diagnosis not present

## 2017-07-08 DIAGNOSIS — H401133 Primary open-angle glaucoma, bilateral, severe stage: Secondary | ICD-10-CM | POA: Diagnosis not present

## 2017-07-08 DIAGNOSIS — H409 Unspecified glaucoma: Secondary | ICD-10-CM | POA: Diagnosis not present

## 2017-07-14 ENCOUNTER — Other Ambulatory Visit: Payer: Self-pay | Admitting: Pharmacist

## 2017-07-14 DIAGNOSIS — I214 Non-ST elevation (NSTEMI) myocardial infarction: Secondary | ICD-10-CM

## 2017-07-14 MED ORDER — ISOSORBIDE MONONITRATE ER 120 MG PO TB24: 120.0000 mg | ORAL_TABLET | Freq: Every day | ORAL | 0 refills | Status: DC

## 2017-07-21 DIAGNOSIS — H401133 Primary open-angle glaucoma, bilateral, severe stage: Secondary | ICD-10-CM | POA: Diagnosis not present

## 2017-08-02 HISTORY — PX: BREAST SURGERY: SHX581

## 2017-08-05 ENCOUNTER — Other Ambulatory Visit: Payer: Self-pay | Admitting: Pharmacist

## 2017-08-08 ENCOUNTER — Other Ambulatory Visit: Payer: Self-pay | Admitting: Internal Medicine

## 2017-08-08 DIAGNOSIS — Z794 Long term (current) use of insulin: Secondary | ICD-10-CM

## 2017-08-08 DIAGNOSIS — E118 Type 2 diabetes mellitus with unspecified complications: Secondary | ICD-10-CM

## 2017-08-08 DIAGNOSIS — I214 Non-ST elevation (NSTEMI) myocardial infarction: Secondary | ICD-10-CM

## 2017-08-08 MED ORDER — INSULIN LISPRO PROT & LISPRO (75-25 MIX) 100 UNIT/ML ~~LOC~~ SUSP: SUBCUTANEOUS | 0 refills | Status: DC

## 2017-08-30 ENCOUNTER — Telehealth: Payer: Self-pay | Admitting: Family Medicine

## 2017-08-30 DIAGNOSIS — Z794 Long term (current) use of insulin: Principal | ICD-10-CM

## 2017-08-30 DIAGNOSIS — E118 Type 2 diabetes mellitus with unspecified complications: Secondary | ICD-10-CM

## 2017-08-30 MED ORDER — INSULIN LISPRO PROT & LISPRO (75-25 MIX) 100 UNIT/ML ~~LOC~~ SUSP: SUBCUTANEOUS | 0 refills | Status: DC

## 2017-08-30 NOTE — Telephone Encounter (Signed)
Pt. Called stating that her appt. Has been cancelled due to the PCP been out of the office. Pt. States that her  insulin lispro protamine-lispro (HUMALOG MIX 75/25) (75-25) 100 UNIT/ML SUSP injection Will not last her until 09/15/17. Pt. Uses [facility] pharmacy on  [address].  Please f/u.

## 2017-08-30 NOTE — Telephone Encounter (Signed)
Insulin refilled.

## 2017-09-01 DIAGNOSIS — H401133 Primary open-angle glaucoma, bilateral, severe stage: Secondary | ICD-10-CM | POA: Diagnosis not present

## 2017-09-02 ENCOUNTER — Ambulatory Visit: Payer: Medicare Other | Admitting: Internal Medicine

## 2017-09-07 ENCOUNTER — Other Ambulatory Visit: Payer: Self-pay | Admitting: Cardiovascular Disease

## 2017-09-12 ENCOUNTER — Telehealth: Payer: Self-pay

## 2017-09-12 DIAGNOSIS — Z794 Long term (current) use of insulin: Principal | ICD-10-CM

## 2017-09-12 DIAGNOSIS — E118 Type 2 diabetes mellitus with unspecified complications: Secondary | ICD-10-CM

## 2017-09-12 MED ORDER — INSULIN LISPRO PROT & LISPRO (75-25 MIX) 100 UNIT/ML ~~LOC~~ SUSP: SUBCUTANEOUS | 0 refills | Status: DC

## 2017-09-15 ENCOUNTER — Ambulatory Visit: Payer: Medicare Other | Attending: Internal Medicine | Admitting: Internal Medicine

## 2017-09-15 ENCOUNTER — Encounter: Payer: Self-pay | Admitting: Internal Medicine

## 2017-09-15 VITALS — BP 131/75 | HR 63 | Temp 98.4°F | Resp 16 | Ht 63.0 in | Wt 293.6 lb

## 2017-09-15 DIAGNOSIS — K219 Gastro-esophageal reflux disease without esophagitis: Secondary | ICD-10-CM | POA: Insufficient documentation

## 2017-09-15 DIAGNOSIS — Z1331 Encounter for screening for depression: Secondary | ICD-10-CM

## 2017-09-15 DIAGNOSIS — K5903 Drug induced constipation: Secondary | ICD-10-CM

## 2017-09-15 DIAGNOSIS — K3184 Gastroparesis: Secondary | ICD-10-CM | POA: Insufficient documentation

## 2017-09-15 DIAGNOSIS — R195 Other fecal abnormalities: Secondary | ICD-10-CM

## 2017-09-15 DIAGNOSIS — Z79899 Other long term (current) drug therapy: Secondary | ICD-10-CM | POA: Insufficient documentation

## 2017-09-15 DIAGNOSIS — Z823 Family history of stroke: Secondary | ICD-10-CM | POA: Insufficient documentation

## 2017-09-15 DIAGNOSIS — Z91018 Allergy to other foods: Secondary | ICD-10-CM | POA: Diagnosis not present

## 2017-09-15 DIAGNOSIS — Z23 Encounter for immunization: Secondary | ICD-10-CM | POA: Insufficient documentation

## 2017-09-15 DIAGNOSIS — E118 Type 2 diabetes mellitus with unspecified complications: Secondary | ICD-10-CM

## 2017-09-15 DIAGNOSIS — Z6841 Body Mass Index (BMI) 40.0 and over, adult: Secondary | ICD-10-CM | POA: Diagnosis not present

## 2017-09-15 DIAGNOSIS — Z794 Long term (current) use of insulin: Secondary | ICD-10-CM

## 2017-09-15 DIAGNOSIS — M170 Bilateral primary osteoarthritis of knee: Secondary | ICD-10-CM | POA: Insufficient documentation

## 2017-09-15 DIAGNOSIS — E785 Hyperlipidemia, unspecified: Secondary | ICD-10-CM | POA: Diagnosis not present

## 2017-09-15 DIAGNOSIS — F329 Major depressive disorder, single episode, unspecified: Secondary | ICD-10-CM | POA: Insufficient documentation

## 2017-09-15 DIAGNOSIS — N183 Chronic kidney disease, stage 3 (moderate): Secondary | ICD-10-CM | POA: Diagnosis not present

## 2017-09-15 DIAGNOSIS — I10 Essential (primary) hypertension: Secondary | ICD-10-CM

## 2017-09-15 DIAGNOSIS — E1122 Type 2 diabetes mellitus with diabetic chronic kidney disease: Secondary | ICD-10-CM | POA: Diagnosis not present

## 2017-09-15 DIAGNOSIS — E538 Deficiency of other specified B group vitamins: Secondary | ICD-10-CM | POA: Insufficient documentation

## 2017-09-15 DIAGNOSIS — Z7902 Long term (current) use of antithrombotics/antiplatelets: Secondary | ICD-10-CM | POA: Diagnosis not present

## 2017-09-15 DIAGNOSIS — Z888 Allergy status to other drugs, medicaments and biological substances status: Secondary | ICD-10-CM | POA: Diagnosis not present

## 2017-09-15 DIAGNOSIS — I5032 Chronic diastolic (congestive) heart failure: Secondary | ICD-10-CM | POA: Insufficient documentation

## 2017-09-15 DIAGNOSIS — Z8371 Family history of colonic polyps: Secondary | ICD-10-CM | POA: Insufficient documentation

## 2017-09-15 DIAGNOSIS — I214 Non-ST elevation (NSTEMI) myocardial infarction: Secondary | ICD-10-CM | POA: Insufficient documentation

## 2017-09-15 DIAGNOSIS — E611 Iron deficiency: Secondary | ICD-10-CM | POA: Diagnosis not present

## 2017-09-15 DIAGNOSIS — E1143 Type 2 diabetes mellitus with diabetic autonomic (poly)neuropathy: Secondary | ICD-10-CM | POA: Diagnosis not present

## 2017-09-15 DIAGNOSIS — I2510 Atherosclerotic heart disease of native coronary artery without angina pectoris: Secondary | ICD-10-CM | POA: Diagnosis not present

## 2017-09-15 DIAGNOSIS — Z7982 Long term (current) use of aspirin: Secondary | ICD-10-CM | POA: Diagnosis not present

## 2017-09-15 DIAGNOSIS — Z9889 Other specified postprocedural states: Secondary | ICD-10-CM | POA: Insufficient documentation

## 2017-09-15 DIAGNOSIS — Z803 Family history of malignant neoplasm of breast: Secondary | ICD-10-CM | POA: Insufficient documentation

## 2017-09-15 DIAGNOSIS — Z8249 Family history of ischemic heart disease and other diseases of the circulatory system: Secondary | ICD-10-CM | POA: Insufficient documentation

## 2017-09-15 DIAGNOSIS — Z9841 Cataract extraction status, right eye: Secondary | ICD-10-CM | POA: Insufficient documentation

## 2017-09-15 DIAGNOSIS — K5909 Other constipation: Secondary | ICD-10-CM | POA: Diagnosis not present

## 2017-09-15 DIAGNOSIS — I130 Hypertensive heart and chronic kidney disease with heart failure and stage 1 through stage 4 chronic kidney disease, or unspecified chronic kidney disease: Secondary | ICD-10-CM | POA: Diagnosis not present

## 2017-09-15 DIAGNOSIS — Z9842 Cataract extraction status, left eye: Secondary | ICD-10-CM | POA: Insufficient documentation

## 2017-09-15 DIAGNOSIS — Z833 Family history of diabetes mellitus: Secondary | ICD-10-CM | POA: Insufficient documentation

## 2017-09-15 LAB — GLUCOSE, POCT (MANUAL RESULT ENTRY): POC GLUCOSE: 156 mg/dL — AB (ref 70–99)

## 2017-09-15 MED ORDER — METOCLOPRAMIDE HCL 5 MG PO TABS: ORAL_TABLET | ORAL | 5 refills | Status: DC

## 2017-09-15 MED ORDER — ISOSORBIDE MONONITRATE ER 120 MG PO TB24: ORAL_TABLET | ORAL | 3 refills | Status: DC

## 2017-09-15 MED ORDER — INSULIN LISPRO PROT & LISPRO (75-25 MIX) 100 UNIT/ML ~~LOC~~ SUSP: SUBCUTANEOUS | 11 refills | Status: DC

## 2017-09-15 MED ORDER — DILTIAZEM HCL ER 240 MG PO CP24: 240.0000 mg | ORAL_CAPSULE | Freq: Every day | ORAL | 3 refills | Status: DC

## 2017-09-15 MED ORDER — B-12 1000 MCG PO TABS: 1000.0000 ug | ORAL_TABLET | Freq: Every day | ORAL | 1 refills | Status: DC

## 2017-09-15 MED ORDER — POTASSIUM CHLORIDE CRYS ER 20 MEQ PO TBCR: 20.0000 meq | EXTENDED_RELEASE_TABLET | Freq: Every day | ORAL | 5 refills | Status: DC

## 2017-09-15 MED ORDER — METOLAZONE 2.5 MG PO TABS: ORAL_TABLET | ORAL | 3 refills | Status: DC

## 2017-09-15 MED ORDER — CLOPIDOGREL BISULFATE 75 MG PO TABS: 75.0000 mg | ORAL_TABLET | Freq: Every day | ORAL | 1 refills | Status: DC

## 2017-09-15 MED ORDER — TETANUS-DIPHTH-ACELL PERTUSSIS 5-2.5-18.5 LF-MCG/0.5 IM SUSP: 0.5000 mL | Freq: Once | INTRAMUSCULAR | 0 refills | Status: AC

## 2017-09-15 MED ORDER — DOCUSATE SODIUM 100 MG PO CAPS: 100.0000 mg | ORAL_CAPSULE | Freq: Two times a day (BID) | ORAL | 3 refills | Status: DC

## 2017-09-15 MED FILL — BOOSTRIX VACCINE SYRINGE: 5-2.5-18.5 | 1 days supply | Qty: 1 | Fill #0

## 2017-09-15 NOTE — Progress Notes (Signed)
Patient ID: [name], female    DOB: Dec 05, 1955  MRN: [id number]  CC: re-establish; Diabetes; and Medication Refill   Subjective: [name] is a 62 y.o. female who presents for chronic ds mnagement.  Last saw Dr. Adrian Blackwater 03/2017 Her concerns today include:  Pt with hx of DM with probable gastroparesis, HTN, HL, CAD, chronic diastolic CHF, obesity, IDA, OA knees  1.  DM: -not checking BS regularly - about once a wk.  Does not like sticking herself.  Can tell when BS low.  No lows for past 3-4 mths Med - having a difficult time getting the Humalog 75/25.  Use to get 4 viles a mth.  Now 3 viles/mth which last a little less than a mth -Eating Habits: she feels she is doing okay with eating habits.  Loves white starches.   -last eye exam was 07/2017.  Had cataract surgery on RT eye.  Reports DM retinopathy BL.  Sees Dr. Michele Rockers -On Reglan 5 mg TID.  Reports N/V if she does not take with meals.  She does report some tremors in both hands when outstretched.  Reports this has been present prior to starting Reglan.  She thinks it started after she had an MI  2.  Referred to GI on last visit for pos FIT test: never saw GI -problem with constipation.  On iron. High fiber foods make her gastroparesis symptoms worse.  Has Miralax which she feels does not work.  Prune juice works well.  3.  Low B 12:  Taking Vit B 12 100 mcg daily.  B12 level was 199 09/2016.   4.  HTN: + LE edema.  Taking only Lasix not Zaroxolyn.  The latter is on her med list and looks like she was given a refill 11/2016 for 10 tablets.  Limits salt in the foods + SOB when conversing and with exertion walking.  No SOB at rest or at nights.  No CP.  Last saw cardiologist 4-5 mths ago  Patient Active Problem List   Diagnosis Date Noted  . Primary osteoarthritis of both knees 04/05/2017  . Chronic bilateral low back pain 03/07/2017  . Chronic pain of right knee 03/07/2017  . Positive colorectal cancer screening using  Cologuard test 03/07/2017  . Vitamin B12 deficiency 12/09/2016  . Bilateral hand numbness 11/26/2016  . Incidental lung nodule, > 50m and < 870m02/13/2018  . Unstable angina pectoris ([facility]  . Vitreous hemorrhage of right eye ([facility]  . Angina pectoris ([facility]07/04/2016  . Diabetic gastroparesis associated with type 2 diabetes mellitus ([facility]02/05/2016  . Controlled type 2 diabetes mellitus with complication, with long-term current use of insulin ([facility]02/05/2016  . Chronic renal insufficiency, stage III (moderate) ([facility]08/17/2016  . Unstable angina (HC[facility]08/16/2016  . Prolonged Q-T interval on ECG 08/10/2013  . Dyslipidemia 04/02/2013  . Chronic diastolic heart failure, NYHA class 3 ([facility] [place]09/01/2012  . Presumed OSA (obstructive sleep apnea) 04/06/2012  . NSTEMI  post-op 04/04/12-medical Rx 04/04/2012  . Obesity, morbid 01/17/2012  . GERD 08/21/2009  . DIZZINESS 08/21/2009  . Post-menopausal bleeding 03/20/2009  . PERIPHERAL EDEMA 03/20/2009  . Coronary atherosclerosis-not a surgical or PCI candidate 2010 02/27/2009  . SHOULDER PAIN, LEFT 10/20/2007  . Proliferative diabetic retinopathy (HCP[facility]03/01/2008  . DETACHED RETINA, BILATERAL, HX OF 09/07/2007  . History of chronic Iron defeicency anemia with acute post op anemia, transfused after debridment 04/04/12 04/17/2007  . Essential hypertension 04/17/2007  Current Outpatient Medications on File Prior to Visit  Medication Sig Dispense Refill  . ACCU-CHEK SOFTCLIX LANCETS lancets Use as instructed for 3 times daily blood glucose monitoring 100 each 5  . acetaminophen (TYLENOL) 500 MG tablet Take 1 tablet (500 mg total) by mouth every 6 (six) hours as needed. 30 tablet 0  . acetaZOLAMIDE (DIAMOX) 500 MG capsule Take 500 mg by mouth 2 (two) times daily.    Marland Kitchen aspirin 81 MG tablet Take 81 mg by mouth daily.    Marland Kitchen atorvastatin (LIPITOR) 80 MG tablet TAKE ONE TABLET BY MOUTH ONCE DAILY IN THE EVENING AT  6PM 90 tablet 3  . baclofen (LIORESAL)  10 MG tablet Take 1 tablet (10 mg total) by mouth at bedtime as needed for muscle spasms. 30 each 5  . bismuth subsalicylate (PEPTO BISMOL) 262 MG/15ML suspension Take 30 mLs by mouth every 6 (six) hours as needed for indigestion.     . Blood Glucose Monitoring Suppl (ACCU-CHEK AVIVA PLUS) w/Device KIT Used as directed 1 kit 0  . Blood Pressure Monitoring (BLOOD PRESSURE MONITOR/ARM) DEVI 1 each by Does not apply route daily. ICD 10, I10 and I50.32 1 Device 0  . brimonidine (ALPHAGAN) 0.2 % ophthalmic solution Place 1 drop into both eyes every 12 (twelve) hours. 5 mL 12  . brimonidine-timolol (COMBIGAN) 0.2-0.5 % ophthalmic solution Place 1 drop into both eyes every 12 (twelve) hours.    . calcium carbonate (TUMS - DOSED IN MG ELEMENTAL CALCIUM) 500 MG chewable tablet Chew 1 tablet by mouth 2 (two) times daily as needed for indigestion.     . ferrous sulfate 325 (65 FE) MG tablet Take 325 mg by mouth daily with breakfast.    . furosemide (LASIX) 80 MG tablet Take 1 tablet (80 mg total) by mouth 2 (two) times daily. 60 tablet 5  . glucose blood test strip Use TID before meals / Dx E11.9 100 each 12  . glucose monitoring kit (FREESTYLE) monitoring kit 1 each by Does not apply route 4 (four) times daily - after meals and at bedtime. 1 each 1  . hydrocortisone cream 1 % Apply 1 application topically daily as needed for itching.    . Insulin Syringe-Needle U-100 (RELION INSULIN SYR 1CC/30G) 30G X 5/16" 1 ML MISC 1 each by Other route 2 (two) times daily. USE AS DIRECTED 200 each 3  . Insulin Syringe-Needle U-100 (RELION INSULIN SYRINGE) 31G X 15/64" 1 ML MISC Use as directed for twice daily insulin adminstration. E11.9 100 each 5  . losartan (COZAAR) 50 MG tablet Take 1 tablet (50 mg total) by mouth daily. 30 tablet 5  . LUMIGAN 0.01 % SOLN Place 1 drop into both eyes 2 (two) times daily.    . Menthol-Methyl Salicylate (MUSCLE RUB EX) Apply 1 application topically daily as needed (pain).    . metoprolol  (TOPROL-XL) 200 MG 24 hr tablet TAKE 1 TABLET BY MOUTH ONCE DAILY 90 tablet 2  . NITROSTAT 0.4 MG SL tablet DISSOLVE 1 TABLET UNDER THE TONGUE EVERY 5 MINUTES AS NEEDED FOR CHEST PAIN. DO NOT EXCEED A TOTAL OF 3 DOSES IN 15 MINUTES 25 tablet 3  . pantoprazole (PROTONIX) 40 MG tablet TAKE ONE TABLET BY MOUTH ONCE DAILY 30 tablet 2  . polyethylene glycol (MIRALAX / GLYCOLAX) packet Take 17 g by mouth daily as needed for mild constipation.    . polyvinyl alcohol (LIQUIFILM TEARS) 1.4 % ophthalmic solution Place 1 drop into both eyes daily as needed.  For dry eyes    . RANEXA 500 MG 12 hr tablet TAKE ONE TABLET BY MOUTH TWICE DAILY 60 tablet 2  . timolol (TIMOPTIC) 0.5 % ophthalmic solution Place 1 drop into both eyes every 12 (twelve) hours. 10 mL 12   No current facility-administered medications on file prior to visit.     Allergies  Allergen Reactions  . Lorazepam Anaphylaxis  . Orange Fruit [Citrus] Anaphylaxis    Pt stated throat swelling, itching    Social History   Socioeconomic History  . Marital status: Married    Spouse name: Not on file  . Number of children: 0  . Years of education: Not on file  . Highest education level: Not on file  Social Needs  . Financial resource strain: Not on file  . Food insecurity - worry: Not on file  . Food insecurity - inability: Not on file  . Transportation needs - medical: Not on file  . Transportation needs - non-medical: Not on file  Occupational History  . Not on file  Tobacco Use  . Smoking status: Never Smoker  . Smokeless tobacco: Never Used  Substance and Sexual Activity  . Alcohol use: No  . Drug use: No  . Sexual activity: Yes  Other Topics Concern  . Not on file  Social History Narrative  . Not on file    Family History  Problem Relation Age of Onset  . Diabetes Mother   . Heart disease Mother   . Heart attack Mother   . Hypertension Mother   . Colon polyps Father   . Diabetes Father   . Heart disease Father   .  Heart attack Father   . Stroke Father   . Hypertension Father   . Diabetes Brother   . Diabetes Sister   . Heart disease Brother   . Heart disease Sister   . Diabetes Maternal Grandmother   . Hypertension Maternal Grandmother   . Hypertension Brother   . Hypertension Sister   . Breast cancer Cousin     Past Surgical History:  Procedure Laterality Date  . CARDIAC CATHETERIZATION  July 2010  . CATARACT EXTRACTION, BILATERAL Bilateral   . DILATION AND CURETTAGE OF UTERUS    . EYE SURGERY Bilateral "several"  . INCISION AND DRAINAGE PERIRECTAL ABSCESS  04/03/2012    ROS: Review of Systems MSK: She has arthritis in both knees.  She sought orthopedics, Dr. Erlinda Hong, 04/2017.  Patient deemed not to be a good surgical candidate due to obesity.  Weight loss encouraged.  She ambulates with a cane.  She is not had any recent falls.  PHYSICAL EXAM: BP 131/75   Pulse 63   Temp 98.4 F (36.9 C) (Oral)   Resp 16   Ht '5\' 3"'  (1.6 m)   Wt 293 lb 9.6 oz (133.2 kg)   LMP 04/02/2012   SpO2 95%   BMI 52.01 kg/m   Wt Readings from Last 3 Encounters:  09/15/17 293 lb 9.6 oz (133.2 kg)  03/07/17 291 lb (132 kg)  01/04/17 287 lb 6.4 oz (130.4 kg)    Physical Exam  General appearance - alert, well appearing, obese older African-American female and in no distress Mental status - alert, oriented to person, place, and time, normal mood, behavior, speech, dress, motor activity, and thought processes Mouth - mucous membranes moist, pharynx normal without lesions Neck - supple, no significant adenopathy Chest - clear to auscultation, no wheezes, rales or rhonchi, symmetric air entry Heart -  normal rate, regular rhythm, normal S1, S2, no murmurs, rubs, clicks or gallops Musculoskeletal -knees: Large body habitus.  Positive joint enlargement.  Ambulates with cane.  She was unable to get on the exam table today so had to be examined in the chair. Extremities -1+ LE edema Neuro:  Slight tremor with arms out  stretch.  No intention tremor.  She was also noted to have fine tremor of RT 5th finger while I was taking history Depression screen [facility] 2/9 09/15/2017 03/07/2017 01/04/2017  Decreased Interest '2 2 1  ' Down, Depressed, Hopeless '2 1 1  ' PHQ - 2 Score '4 3 2  ' Altered sleeping 0 1 1  Tired, decreased energy '3 3 3  ' Change in appetite '2 1 1  ' Feeling bad or failure about yourself  '2 1 1  ' Trouble concentrating '2 1 1  ' Moving slowly or fidgety/restless '2 1 1  ' Suicidal thoughts 0 0 1  PHQ-9 Score '15 11 11  ' Some recent data might be hidden   Results for orders placed or performed in visit on 09/15/17  POCT glucose (manual entry)  Result Value Ref Range   POC Glucose 156 (A) 70 - 99 mg/dl    ASSESSMENT AND PLAN: 1. Controlled type 2 diabetes mellitus with complication, with long-term current use of insulin ([place]) Encourage checking blood sugars at least once a day. Refill Humalog 75/25 insulin 4 vials per month Cut back on white carbohydrates. - POCT glucose (manual entry) - Hemoglobin A1c - insulin lispro protamine-lispro (HUMALOG MIX 75/25) (75-25) 100 UNIT/ML SUSP injection; INJECT 50 UNITS INTO THE SKIN 2 TIMES DAILY WITH A MEAL  Dispense: 40 mL; Refill: 11 - Comprehensive metabolic panel - CBC - Lipid panel  2. Diabetic gastroparesis ([place]) Informed of risks of movement disorders, sometimes non-reversible, that can develop with prolonged use and high doses of Reglan.  Patient reports her current tremors were present before she was placed on Reglan and have not progressed. -Recommend trying to decrease her Reglan from 3 times daily to BID with 2 largest meals of the day - metoCLOPramide (REGLAN) 5 MG tablet; 1 tab twice a day before the two larges meals of the day  Dispense: 60 tablet; Refill: 5  3. Essential hypertension At goal.  Continue current medications and low-salt diet. - potassium chloride SA (K-DUR,KLOR-CON) 20 MEQ tablet; Take 1 tablet (20 mEq total) by mouth daily.  Dispense: 60  tablet; Refill: 5 - diltiazem (DILACOR XR) 240 MG 24 hr capsule; Take 1 capsule (240 mg total) by mouth daily.  Dispense: 90 capsule; Refill: 3  4. Chronic diastolic heart failure, NYHA class 3 ([place]) -She does have some lower extremity edema. Recommend Zaroxolyn once every 2 weeks as needed for increased edema.  Continue furosemide 80 mg twice daily -Limit salt in foods - potassium chloride SA (K-DUR,KLOR-CON) 20 MEQ tablet; Take 1 tablet (20 mEq total) by mouth daily.  Dispense: 60 tablet; Refill: 5 - metolazone (ZAROXOLYN) 2.5 MG tablet; 1 tab once every two weeks PRN swelling lower extremities  Dispense: 10 tablet; Refill: 3  5. Coronary artery disease involving native coronary artery of native heart without angina pectoris - clopidogrel (PLAVIX) 75 MG tablet; Take 1 tablet (75 mg total) by mouth daily.  Dispense: 90 tablet; Refill: 1 - diltiazem (DILACOR XR) 240 MG 24 hr capsule; Take 1 capsule (240 mg total) by mouth daily.  Dispense: 90 capsule; Refill: 3 - isosorbide mononitrate (IMDUR) 120 MG 24 hr tablet; TAKE 1 TABLET BY MOUTH ONCE  DAILY  Dispense: 90 tablet; Refill: 3  6. Vitamin B12 deficiency -Recommend increased dose of B12 supplement from 100 to 1000 MCG's daily - Cyanocobalamin (VITAMIN B-12) 1000 MCG TABS; Take 1,000 mcg by mouth daily.  Dispense: 90 tablet; Refill: 1 - Vitamin B12  7. Iron deficiency Recommend referral for colonoscopy.   - Ambulatory referral to Gastroenterology  8. Positive FIT (fecal immunochemical test) - Ambulatory referral to Gastroenterology  9. Positive depression screening We did not have time to address today.  Will address on follow-up visit  10. Drug-induced constipation -Patient told it is okay to drink a little bit of prune juice twice a week to keep bowel movements regular.  She would also like to try Colace - docusate sodium (COLACE) 100 MG capsule; Take 1 capsule (100 mg total) by mouth 2 (two) times daily.  Dispense: 60 capsule;  Refill: 3  11. Need for influenza vaccination - Flu Vaccine QUAD 6+ mos PF IM (Fluarix Quad PF)  Patient was given the opportunity to ask questions.  Patient verbalized understanding of the plan and was able to repeat key elements of the plan.   Orders Placed This Encounter  Procedures  . Flu Vaccine QUAD 6+ mos PF IM (Fluarix Quad PF)  . Hemoglobin A1c  . Comprehensive metabolic panel  . CBC  . Lipid panel  . Vitamin B12  . Ambulatory referral to Gastroenterology  . POCT glucose (manual entry)     Requested Prescriptions   Signed Prescriptions Disp Refills  . insulin lispro protamine-lispro (HUMALOG MIX 75/25) (75-25) 100 UNIT/ML SUSP injection 40 mL 11    Sig: INJECT 50 UNITS INTO THE SKIN 2 TIMES DAILY WITH A MEAL  . docusate sodium (COLACE) 100 MG capsule 60 capsule 3    Sig: Take 1 capsule (100 mg total) by mouth 2 (two) times daily.  . clopidogrel (PLAVIX) 75 MG tablet 90 tablet 1    Sig: Take 1 tablet (75 mg total) by mouth daily.  . potassium chloride SA (K-DUR,KLOR-CON) 20 MEQ tablet 60 tablet 5    Sig: Take 1 tablet (20 mEq total) by mouth daily.  Marland Kitchen diltiazem (DILACOR XR) 240 MG 24 hr capsule 90 capsule 3    Sig: Take 1 capsule (240 mg total) by mouth daily.  . isosorbide mononitrate (IMDUR) 120 MG 24 hr tablet 90 tablet 3    Sig: TAKE 1 TABLET BY MOUTH ONCE DAILY  . metolazone (ZAROXOLYN) 2.5 MG tablet 10 tablet 3    Sig: 1 tab once every two weeks PRN swelling lower extremities  . Cyanocobalamin (VITAMIN B-12) 1000 MCG TABS 90 tablet 1    Sig: Take 1,000 mcg by mouth daily.  . metoCLOPramide (REGLAN) 5 MG tablet 60 tablet 5    Sig: 1 tab twice a day before the two larges meals of the day  . Tdap (BOOSTRIX) 5-2.5-18.5 LF-MCG/0.5 injection 0.5 mL 0    Sig: Inject 0.5 mLs into the muscle once for 1 dose.    Return in about 2 months (around 11/13/2017).  Karle Plumber, MD, FACP

## 2017-09-15 NOTE — Patient Instructions (Addendum)
Try to decrease the Reglan to twice a day with the 2 largest meals of the day.   Cut back on eating white carbohydrates. Increase Vitamin B12 to 1000 mcg daily.     Fall Prevention in the Home Falls can cause injuries. They can happen to people of all ages. There are many things you can do to make your home safe and to help prevent falls. What can I do on the outside of my home?  Regularly fix the edges of walkways and driveways and fix any cracks.  Remove anything that might make you trip as you walk through a door, such as a raised step or threshold.  Trim any bushes or trees on the path to your home.  Use bright outdoor lighting.  Clear any walking paths of anything that might make someone trip, such as rocks or tools.  Regularly check to see if handrails are loose or broken. Make sure that both sides of any steps have handrails.  Any raised decks and porches should have guardrails on the edges.  Have any leaves, snow, or ice cleared regularly.  Use sand or salt on walking paths during winter.  Clean up any spills in your garage right away. This includes oil or grease spills. What can I do in the bathroom?  Use night lights.  Install grab bars by the toilet and in the tub and shower. Do not use towel bars as grab bars.  Use non-skid mats or decals in the tub or shower.  If you need to sit down in the shower, use a plastic, non-slip stool.  Keep the floor dry. Clean up any water that spills on the floor as soon as it happens.  Remove soap buildup in the tub or shower regularly.  Attach bath mats securely with double-sided non-slip rug tape.  Do not have throw rugs and other things on the floor that can make you trip. What can I do in the bedroom?  Use night lights.  Make sure that you have a light by your bed that is easy to reach.  Do not use any sheets or blankets that are too big for your bed. They should not hang down onto the floor.  Have a firm chair that  has side arms. You can use this for support while you get dressed.  Do not have throw rugs and other things on the floor that can make you trip. What can I do in the kitchen?  Clean up any spills right away.  Avoid walking on wet floors.  Keep items that you use a lot in easy-to-reach places.  If you need to reach something above you, use a strong step stool that has a grab bar.  Keep electrical cords out of the way.  Do not use floor polish or wax that makes floors slippery. If you must use wax, use non-skid floor wax.  Do not have throw rugs and other things on the floor that can make you trip. What can I do with my stairs?  Do not leave any items on the stairs.  Make sure that there are handrails on both sides of the stairs and use them. Fix handrails that are broken or loose. Make sure that handrails are as long as the stairways.  Check any carpeting to make sure that it is firmly attached to the stairs. Fix any carpet that is loose or worn.  Avoid having throw rugs at the top or bottom of the stairs. If you  do have throw rugs, attach them to the floor with carpet tape.  Make sure that you have a light switch at the top of the stairs and the bottom of the stairs. If you do not have them, ask someone to add them for you. What else can I do to help prevent falls?  Wear shoes that: ? Do not have high heels. ? Have rubber bottoms. ? Are comfortable and fit you well. ? Are closed at the toe. Do not wear sandals.  If you use a stepladder: ? Make sure that it is fully opened. Do not climb a closed stepladder. ? Make sure that both sides of the stepladder are locked into place. ? Ask someone to hold it for you, if possible.  Clearly mark and make sure that you can see: ? Any grab bars or handrails. ? First and last steps. ? Where the edge of each step is.  Use tools that help you move around (mobility aids) if they are needed. These  include: ? Canes. ? Walkers. ? Scooters. ? Crutches.  Turn on the lights when you go into a dark area. Replace any light bulbs as soon as they burn out.  Set up your furniture so you have a clear path. Avoid moving your furniture around.  If any of your floors are uneven, fix them.  If there are any pets around you, be aware of where they are.  Review your medicines with your doctor. Some medicines can make you feel dizzy. This can increase your chance of falling. Ask your doctor what other things that you can do to help prevent falls. This information is not intended to replace advice given to you by your health care provider. Make sure you discuss any questions you have with your health care provider. Document Released: 05/15/2009 Document Revised: 12/25/2015 Document Reviewed: 08/23/2014 Elsevier Interactive Patient Education  2018 [place].  Influenza Virus Vaccine injection (Fluarix) What is this medicine? INFLUENZA VIRUS VACCINE (in floo EN zuh VAHY ruhs vak SEEN) helps to reduce the risk of getting influenza also known as the flu. This medicine may be used for other purposes; ask your health care provider or pharmacist if you have questions. COMMON BRAND NAME(S): Fluarix, Fluzone What should I tell my health care provider before I take this medicine? They need to know if you have any of these conditions: -bleeding disorder like hemophilia -fever or infection -Guillain-Barre syndrome or other neurological problems -immune system problems -infection with the human immunodeficiency virus (HIV) or AIDS -low blood platelet counts -multiple sclerosis -an unusual or allergic reaction to influenza virus vaccine, eggs, chicken proteins, latex, gentamicin, other medicines, foods, dyes or preservatives -pregnant or trying to get pregnant -breast-feeding How should I use this medicine? This vaccine is for injection into a muscle. It is given by a health care professional. A  copy of Vaccine Information Statements will be given before each vaccination. Read this sheet carefully each time. The sheet may change frequently. Talk to your pediatrician regarding the use of this medicine in children. Special care may be needed. Overdosage: If you think you have taken too much of this medicine contact a poison control center or emergency room at once. NOTE: This medicine is only for you. Do not share this medicine with others. What if I miss a dose? This does not apply. What may interact with this medicine? -chemotherapy or radiation therapy -medicines that lower your immune system like etanercept, anakinra, infliximab, and adalimumab -medicines that treat  or prevent blood clots like warfarin -phenytoin -steroid medicines like prednisone or cortisone -theophylline -vaccines This list may not describe all possible interactions. Give your health care provider a list of all the medicines, herbs, non-prescription drugs, or dietary supplements you use. Also tell them if you smoke, drink alcohol, or use illegal drugs. Some items may interact with your medicine. What should I watch for while using this medicine? Report any side effects that do not go away within 3 days to your doctor or health care professional. Call your health care provider if any unusual symptoms occur within 6 weeks of receiving this vaccine. You may still catch the flu, but the illness is not usually as bad. You cannot get the flu from the vaccine. The vaccine will not protect against colds or other illnesses that may cause fever. The vaccine is needed every year. What side effects may I notice from receiving this medicine? Side effects that you should report to your doctor or health care professional as soon as possible: -allergic reactions like skin rash, itching or hives, swelling of the face, lips, or tongue Side effects that usually do not require medical attention (report to your doctor or health care  professional if they continue or are bothersome): -fever -headache -muscle aches and pains -pain, tenderness, redness, or swelling at site where injected -weak or tired This list may not describe all possible side effects. Call your doctor for medical advice about side effects. You may report side effects to FDA at 1-800-FDA-1088. Where should I keep my medicine? This vaccine is only given in a clinic, pharmacy, doctor's office, or other health care setting and will not be stored at home. NOTE: This sheet is a summary. It may not cover all possible information. If you have questions about this medicine, talk to your doctor, pharmacist, or health care provider.  2018 Elsevier/Gold Standard (2008-02-14 09:30:40)

## 2017-09-16 LAB — CBC
Hematocrit: 39.3 % (ref 34.0–46.6)
Hemoglobin: 13.2 g/dL (ref 11.1–15.9)
MCH: 31 pg (ref 26.6–33.0)
MCHC: 33.6 g/dL (ref 31.5–35.7)
MCV: 92 fL (ref 79–97)
PLATELETS: 256 10*3/uL (ref 150–379)
RBC: 4.26 x10E6/uL (ref 3.77–5.28)
RDW: 13.8 % (ref 12.3–15.4)
WBC: 11.6 10*3/uL — ABNORMAL HIGH (ref 3.4–10.8)

## 2017-09-16 LAB — COMPREHENSIVE METABOLIC PANEL
A/G RATIO: 1.1 — AB (ref 1.2–2.2)
ALT: 11 IU/L (ref 0–32)
AST: 9 IU/L (ref 0–40)
Albumin: 3.6 g/dL (ref 3.6–4.8)
Alkaline Phosphatase: 62 IU/L (ref 39–117)
BUN/Creatinine Ratio: 11 — ABNORMAL LOW (ref 12–28)
BUN: 15 mg/dL (ref 8–27)
Bilirubin Total: 0.3 mg/dL (ref 0.0–1.2)
CALCIUM: 9 mg/dL (ref 8.7–10.3)
CO2: 20 mmol/L (ref 20–29)
Chloride: 104 mmol/L (ref 96–106)
Creatinine, Ser: 1.31 mg/dL — ABNORMAL HIGH (ref 0.57–1.00)
GFR, EST AFRICAN AMERICAN: 50 mL/min/{1.73_m2} — AB (ref >59)
GFR, EST NON AFRICAN AMERICAN: 44 mL/min/{1.73_m2} — AB (ref >59)
Globulin, Total: 3.4 g/dL (ref 1.5–4.5)
Glucose: 133 mg/dL — ABNORMAL HIGH (ref 65–99)
POTASSIUM: 3.4 mmol/L — AB (ref 3.5–5.2)
SODIUM: 141 mmol/L (ref 134–144)
Total Protein: 7 g/dL (ref 6.0–8.5)

## 2017-09-16 LAB — LIPID PANEL
CHOLESTEROL TOTAL: 113 mg/dL (ref 100–199)
Chol/HDL Ratio: 2.7 ratio (ref 0.0–4.4)
HDL: 42 mg/dL (ref >39)
LDL Calculated: 56 mg/dL (ref 0–99)
Triglycerides: 76 mg/dL (ref 0–149)
VLDL Cholesterol Cal: 15 mg/dL (ref 5–40)

## 2017-09-16 LAB — HEMOGLOBIN A1C
ESTIMATED AVERAGE GLUCOSE: 163 mg/dL
Hgb A1c MFr Bld: 7.3 % — ABNORMAL HIGH (ref 4.8–5.6)

## 2017-09-16 LAB — VITAMIN B12: Vitamin B-12: 241 pg/mL (ref 232–1245)

## 2017-09-19 ENCOUNTER — Telehealth: Payer: Self-pay

## 2017-09-19 NOTE — Telephone Encounter (Signed)
Contacted pt to go over lab results pt is aware of results and doesn't have any questions or concerns 

## 2017-09-20 ENCOUNTER — Encounter: Payer: Self-pay | Admitting: Gastroenterology

## 2017-10-05 ENCOUNTER — Other Ambulatory Visit: Payer: Self-pay | Admitting: Family Medicine

## 2017-10-05 DIAGNOSIS — Z1231 Encounter for screening mammogram for malignant neoplasm of breast: Secondary | ICD-10-CM

## 2017-10-10 ENCOUNTER — Other Ambulatory Visit: Payer: Self-pay | Admitting: Pharmacist

## 2017-10-10 MED ORDER — "INSULIN SYRINGE-NEEDLE U-100 31G X 15/64"" 1 ML MISC": 5 refills | Status: DC

## 2017-10-26 ENCOUNTER — Other Ambulatory Visit: Payer: Self-pay | Admitting: Cardiovascular Disease

## 2017-11-07 ENCOUNTER — Encounter: Payer: Self-pay | Admitting: Gastroenterology

## 2017-11-07 ENCOUNTER — Other Ambulatory Visit: Payer: Self-pay

## 2017-11-07 ENCOUNTER — Ambulatory Visit (INDEPENDENT_AMBULATORY_CARE_PROVIDER_SITE_OTHER): Payer: Medicare Other | Admitting: Gastroenterology

## 2017-11-07 ENCOUNTER — Telehealth: Payer: Self-pay

## 2017-11-07 VITALS — BP 134/74 | HR 80 | Ht 63.0 in | Wt 296.1 lb

## 2017-11-07 DIAGNOSIS — K5900 Constipation, unspecified: Secondary | ICD-10-CM | POA: Diagnosis not present

## 2017-11-07 DIAGNOSIS — R195 Other fecal abnormalities: Secondary | ICD-10-CM | POA: Diagnosis not present

## 2017-11-07 DIAGNOSIS — Z7902 Long term (current) use of antithrombotics/antiplatelets: Secondary | ICD-10-CM

## 2017-11-07 MED ORDER — SUPREP BOWEL PREP KIT 17.5-3.13-1.6 GM/177ML PO SOLN: 1.0000 | ORAL | 0 refills | Status: DC

## 2017-11-07 NOTE — Telephone Encounter (Signed)
   Primary Cardiologist:Dr Croitoru  Chart reviewed as part of pre-operative protocol coverage. Because of [name]'s past medical history and time since last visit, he/she will require a follow-up visit in order to better assess preoperative cardiovascular risk.  Pre-op covering staff: - Please schedule appointment and call patient to inform them. - Please contact requesting surgeon's office via preferred method (i.e, phone, fax) to inform them of need for appointment prior to surgery.  Kerin Ransom, PA-C  11/07/2017, 4:56 PM

## 2017-11-07 NOTE — Patient Instructions (Addendum)
If you are age 63 or older, your body mass index should be between 23-30. Your Body mass index is 52.46 kg/m. If this is out of the aforementioned range listed, please consider follow up with your Primary Care Provider.  If you are age 56 or younger, your body mass index should be between 19-25. Your Body mass index is 52.46 kg/m. If this is out of the aformentioned range listed, please consider follow up with your Primary Care Provider.   You have been scheduled for a colonoscopy. Please follow written instructions given to you at your visit today.  Please pick up your prep supplies at the pharmacy within the next 1-3 days. If you use inhalers (even only as needed), please bring them with you on the day of your procedure. Your physician has requested that you go to www.[email or web address].com and enter the access code given to you at your visit today. This web site gives a general overview about your procedure. However, you should still follow specific instructions given to you by our office regarding your preparation for the procedure.  You will be contacted by our office prior to your procedure for directions on holding your Plavix.  If you do not hear from our office 1 week prior to your scheduled procedure, please call [phone] to discuss.   Please discontinue the use of Pepto Bismol.  Hold your Iron 5 days before your colonoscopy.

## 2017-11-07 NOTE — Telephone Encounter (Signed)
[facility] Pre-operative Risk Assessment     Request for surgical clearance:     Endoscopy Procedure  What type of surgery is being performed?     colonoscopy  When is this surgery scheduled?     01/02/18  What type of clearance is required ?   Pharmacy  Are there any medications that need to be held prior to surgery and how long? 5 Plavix 5 days  Practice name and name of physician performing surgery?      [facility]  What is your office phone and fax number?      Phone- [phone]  [phone]  Anesthesia type (None, local, MAC, general) ?       MAC

## 2017-11-07 NOTE — Progress Notes (Signed)
History of Present Illness: This is a 62 year old female referred by Ladell Pier, MD for the evaluation of Cologuard positive in 01/2017. She has constipation for several years.  Treated with prune juice and Ducolax daily.  She had iron deficiency diagnosed 7 years ago however her most recent iron studies were normal.  Mild B12 deficiency was diagnosed 1 year ago.  She takes Pepto-Bismol frequently for indigestion symptoms.  She has not previously undergone colonoscopy.  She states she was diagnosed with diabetic gastroparesis and is been maintained on metoclopramide and pantoprazole.  I cannot locate the results of the gastric emptying study in Epic.  She is maintained on Plavix.  Denies weight loss, abdominal pain, diarrhea, change in stool caliber, melena, hematochezia, nausea, vomiting, dysphagia, reflux symptoms, chest pain.    Allergies  Allergen Reactions  . Ativan [Lorazepam] Anaphylaxis  . Orange Fruit [Citrus] Anaphylaxis    Pt stated throat swelling, itching   Outpatient Medications Prior to Visit  Medication Sig Dispense Refill  . ACCU-CHEK SOFTCLIX LANCETS lancets Use as instructed for 3 times daily blood glucose monitoring 100 each 5  . acetaminophen (TYLENOL) 500 MG tablet Take 1 tablet (500 mg total) by mouth every 6 (six) hours as needed. 30 tablet 0  . acetaZOLAMIDE (DIAMOX) 500 MG capsule Take 500 mg by mouth 2 (two) times daily.    Marland Kitchen aspirin 81 MG tablet Take 81 mg by mouth daily.    Marland Kitchen atorvastatin (LIPITOR) 80 MG tablet TAKE ONE TABLET BY MOUTH ONCE DAILY IN THE EVENING AT  6PM 90 tablet 3  . baclofen (LIORESAL) 10 MG tablet Take 1 tablet (10 mg total) by mouth at bedtime as needed for muscle spasms. 30 each 5  . bismuth subsalicylate (PEPTO BISMOL) 262 MG/15ML suspension Take 30 mLs by mouth every 6 (six) hours as needed for indigestion.     . Blood Glucose Monitoring Suppl (ACCU-CHEK AVIVA PLUS) w/Device KIT Used as directed 1 kit 0  . Blood Pressure Monitoring  (BLOOD PRESSURE MONITOR/ARM) DEVI 1 each by Does not apply route daily. ICD 10, I10 and I50.32 1 Device 0  . brimonidine (ALPHAGAN) 0.2 % ophthalmic solution Place 1 drop into both eyes every 12 (twelve) hours. 5 mL 12  . brimonidine-timolol (COMBIGAN) 0.2-0.5 % ophthalmic solution Place 1 drop into both eyes every 12 (twelve) hours.    . calcium carbonate (TUMS - DOSED IN MG ELEMENTAL CALCIUM) 500 MG chewable tablet Chew 1 tablet by mouth 2 (two) times daily as needed for indigestion.     . clopidogrel (PLAVIX) 75 MG tablet Take 1 tablet (75 mg total) by mouth daily. 90 tablet 1  . Cyanocobalamin (VITAMIN B-12) 1000 MCG TABS Take 1,000 mcg by mouth daily. 90 tablet 1  . diltiazem (DILACOR XR) 240 MG 24 hr capsule Take 1 capsule (240 mg total) by mouth daily. 90 capsule 3  . docusate sodium (COLACE) 100 MG capsule Take 1 capsule (100 mg total) by mouth 2 (two) times daily. 60 capsule 3  . ferrous sulfate 325 (65 FE) MG tablet Take 325 mg by mouth daily with breakfast.    . glucose blood test strip Use TID before meals / Dx E11.9 100 each 12  . glucose monitoring kit (FREESTYLE) monitoring kit 1 each by Does not apply route 4 (four) times daily - after meals and at bedtime. 1 each 1  . hydrocortisone cream 1 % Apply 1 application topically daily as needed for itching.    Marland Kitchen  insulin lispro protamine-lispro (HUMALOG MIX 75/25) (75-25) 100 UNIT/ML SUSP injection INJECT 50 UNITS INTO THE SKIN 2 TIMES DAILY WITH A MEAL 40 mL 11  . Insulin Syringe-Needle U-100 (RELION INSULIN SYRINGE) 31G X 15/64" 1 ML MISC Use as directed for twice daily insulin adminstration. E11.9 100 each 5  . isosorbide mononitrate (IMDUR) 120 MG 24 hr tablet TAKE 1 TABLET BY MOUTH ONCE DAILY 90 tablet 3  . losartan (COZAAR) 50 MG tablet Take 1 tablet (50 mg total) by mouth daily. 30 tablet 5  . LUMIGAN 0.01 % SOLN Place 1 drop into both eyes 2 (two) times daily.    . Menthol-Methyl Salicylate (MUSCLE RUB EX) Apply 1 application  topically daily as needed (pain).    Marland Kitchen metoCLOPramide (REGLAN) 5 MG tablet 1 tab twice a day before the two larges meals of the day 60 tablet 5  . metolazone (ZAROXOLYN) 2.5 MG tablet 1 tab once every two weeks PRN swelling lower extremities 10 tablet 3  . metoprolol (TOPROL-XL) 200 MG 24 hr tablet TAKE 1 TABLET BY MOUTH ONCE DAILY 90 tablet 2  . nitroGLYCERIN (NITROSTAT) 0.4 MG SL tablet DISSOLVE ONE TABLET UNDER THE TONGUE EVERY 5 MINUTES AS NEEDED FOR CHEST PAIN.  DO NOT EXCEED A TOTAL OF 3 DOSES IN 15 MINUTES NOW 25 tablet 3  . pantoprazole (PROTONIX) 40 MG tablet TAKE ONE TABLET BY MOUTH ONCE DAILY 30 tablet 2  . polyethylene glycol (MIRALAX / GLYCOLAX) packet Take 17 g by mouth daily as needed for mild constipation.    . polyvinyl alcohol (LIQUIFILM TEARS) 1.4 % ophthalmic solution Place 1 drop into both eyes daily as needed. For dry eyes    . potassium chloride SA (K-DUR,KLOR-CON) 20 MEQ tablet Take 1 tablet (20 mEq total) by mouth daily. 60 tablet 5  . RANEXA 500 MG 12 hr tablet TAKE ONE TABLET BY MOUTH TWICE DAILY 60 tablet 2  . timolol (TIMOPTIC) 0.5 % ophthalmic solution Place 1 drop into both eyes every 12 (twelve) hours. 10 mL 12  . furosemide (LASIX) 80 MG tablet Take 1 tablet (80 mg total) by mouth 2 (two) times daily. 60 tablet 5   No facility-administered medications prior to visit.    Past Medical History:  Diagnosis Date  . Anemia   . Blind right eye   . Chronic back pain    "my whole back" (03/18/2015)  . Chronic diastolic heart failure ([place]) 9/13   echo 03/20/15 LV Ef of 60-65%, grade 2DD and PA peak pressure: 36 mm Hg   . Coronary artery disease    Medical Rx  . Depression   . Diabetic retinopathy ([place])   . GERD (gastroesophageal reflux disease)   . Glaucoma   . Headache   . History of blood transfusion "several"   "related to menopause"  . Hyperlipemia   . Hypertension   . Morbid obesity with BMI of 40.0-44.9, adult ([place])   . Myocardial infarction ([place])  "several"  . Neuropathy    hands    diabetic neuropthy  . Obstructive sleep apnea    "they say I do; I say I don't; no mask" (03/18/2015)  . Renal insufficiency   . Type II diabetes mellitus ([facility])    insulin dependent   Past Surgical History:  Procedure Laterality Date  . CARDIAC CATHETERIZATION  July 2010  . CATARACT EXTRACTION, BILATERAL Bilateral   . DILATION AND CURETTAGE OF UTERUS    . EYE SURGERY Bilateral "several"  . INCISION AND DRAINAGE  PERIRECTAL ABSCESS  04/03/2012   Social History   Socioeconomic History  . Marital status: Married    Spouse name: Not on file  . Number of children: 0  . Years of education: Not on file  . Highest education level: Not on file  Occupational History  . Occupation: disabled  Social Needs  . Financial resource strain: Not on file  . Food insecurity:    Worry: Not on file    Inability: Not on file  . Transportation needs:    Medical: Not on file    Non-medical: Not on file  Tobacco Use  . Smoking status: Never Smoker  . Smokeless tobacco: Never Used  Substance and Sexual Activity  . Alcohol use: No  . Drug use: No  . Sexual activity: Yes  Lifestyle  . Physical activity:    Days per week: Not on file    Minutes per session: Not on file  . Stress: Not on file  Relationships  . Social connections:    Talks on phone: Not on file    Gets together: Not on file    Attends religious service: Not on file    Active member of club or organization: Not on file    Attends meetings of clubs or organizations: Not on file    Relationship status: Not on file  Other Topics Concern  . Not on file  Social History Narrative  . Not on file   Family History  Problem Relation Age of Onset  . Diabetes Mother   . Heart disease Mother   . Heart attack Mother   . Hypertension Mother   . Breast cancer Mother   . Colon polyps Father   . Diabetes Father   . Heart disease Father   . Heart attack Father   . Stroke Father   . Hypertension  Father   . Diabetes Brother   . Diabetes Sister   . Heart disease Brother   . Heart disease Sister   . Diabetes Maternal Grandmother   . Hypertension Maternal Grandmother   . Hypertension Brother   . Hypertension Sister   . Breast cancer Cousin   . Colon cancer Cousin       Review of Systems: Pertinent positive and negative review of systems were noted in the above HPI section. All other review of systems were otherwise negative.    Physical Exam: General: Well developed, well nourished, no acute distress Head: Normocephalic and atraumatic Eyes:  sclerae anicteric, EOMI Ears: Normal auditory acuity Mouth: No deformity or lesions Neck: Supple, no masses or thyromegaly Lungs: Clear throughout to auscultation Heart: Regular rate and rhythm; no murmurs, rubs or bruits Abdomen: Soft, non tender and non distended. No masses, hepatosplenomegaly or hernias noted. Normal Bowel sounds Rectal: Deferred to colonoscopy Musculoskeletal: Symmetrical with no gross deformities  Skin: No lesions on visible extremities Pulses:  Normal pulses noted Extremities: No clubbing, cyanosis, edema or deformities noted Neurological: Alert oriented x 4, grossly nonfocal Cervical Nodes:  No significant cervical adenopathy Inguinal Nodes: No significant inguinal adenopathy Psychological:  Alert and cooperative. Normal mood and affect  Assessment and Recommendations:  1. Cologuard positive stool. R/O colorectal neoplasms.  Schedule colonoscopy at hospital due to BMI.  The risks (including bleeding, perforation, infection, missed lesions, medication reactions and possible hospitalization or surgery if complications occur), benefits, and alternatives to colonoscopy with possible biopsy and possible polypectomy were discussed with the patient and they consent to proceed.   2.  Constipation. DC Peptobismol  as it could be exacerbating constipation. Continue prune juice and Ducolax.   3. Gastroparesis.  Unclear  how this diagnosis was established.  Symptoms under good control on metoclopramide and pantoprazole.  4. Hold Plavix 5 days before procedure - will instruct when and how to resume after procedure. Low but real risk of cardiovascular event such as heart attack, stroke, embolism, thrombosis or ischemia/infarct of other organs off Plavix explained and need to seek urgent help if this occurs. The patient consents to proceed. Will communicate by phone or EMR with patient's prescribing provider to confirm that holding Plavix is reasonable in this case.   5. BMI=52.  6. CKD stage III.   7. DM.  8. Diastolic heart failure.    cc: Ladell Pier, MD [address] [place], [place] [address]

## 2017-11-08 NOTE — Telephone Encounter (Signed)
Patient scheduled with Dr. Sallyanne Kuster for preop clearance. Patient last seen 12/23/16.

## 2017-11-09 ENCOUNTER — Telehealth: Payer: Self-pay | Admitting: Cardiovascular Disease

## 2017-11-09 NOTE — Telephone Encounter (Signed)
Close encounter 

## 2017-11-15 ENCOUNTER — Other Ambulatory Visit: Payer: Self-pay

## 2017-11-15 ENCOUNTER — Encounter: Payer: Self-pay | Admitting: Internal Medicine

## 2017-11-15 ENCOUNTER — Ambulatory Visit: Payer: Medicare Other | Attending: Internal Medicine | Admitting: Internal Medicine

## 2017-11-15 VITALS — BP 145/77 | HR 64 | Temp 98.7°F | Resp 20

## 2017-11-15 DIAGNOSIS — I25110 Atherosclerotic heart disease of native coronary artery with unstable angina pectoris: Secondary | ICD-10-CM | POA: Diagnosis not present

## 2017-11-15 DIAGNOSIS — Z7982 Long term (current) use of aspirin: Secondary | ICD-10-CM | POA: Insufficient documentation

## 2017-11-15 DIAGNOSIS — Z79899 Other long term (current) drug therapy: Secondary | ICD-10-CM | POA: Diagnosis not present

## 2017-11-15 DIAGNOSIS — F4321 Adjustment disorder with depressed mood: Secondary | ICD-10-CM

## 2017-11-15 DIAGNOSIS — Z823 Family history of stroke: Secondary | ICD-10-CM | POA: Insufficient documentation

## 2017-11-15 DIAGNOSIS — I130 Hypertensive heart and chronic kidney disease with heart failure and stage 1 through stage 4 chronic kidney disease, or unspecified chronic kidney disease: Secondary | ICD-10-CM | POA: Diagnosis not present

## 2017-11-15 DIAGNOSIS — G8929 Other chronic pain: Secondary | ICD-10-CM | POA: Insufficient documentation

## 2017-11-15 DIAGNOSIS — I5032 Chronic diastolic (congestive) heart failure: Secondary | ICD-10-CM | POA: Insufficient documentation

## 2017-11-15 DIAGNOSIS — Z803 Family history of malignant neoplasm of breast: Secondary | ICD-10-CM | POA: Insufficient documentation

## 2017-11-15 DIAGNOSIS — I10 Essential (primary) hypertension: Secondary | ICD-10-CM

## 2017-11-15 DIAGNOSIS — Z794 Long term (current) use of insulin: Secondary | ICD-10-CM

## 2017-11-15 DIAGNOSIS — I2510 Atherosclerotic heart disease of native coronary artery without angina pectoris: Secondary | ICD-10-CM | POA: Diagnosis not present

## 2017-11-15 DIAGNOSIS — Z8249 Family history of ischemic heart disease and other diseases of the circulatory system: Secondary | ICD-10-CM | POA: Insufficient documentation

## 2017-11-15 DIAGNOSIS — F4322 Adjustment disorder with anxiety: Secondary | ICD-10-CM | POA: Insufficient documentation

## 2017-11-15 DIAGNOSIS — E1122 Type 2 diabetes mellitus with diabetic chronic kidney disease: Secondary | ICD-10-CM | POA: Diagnosis not present

## 2017-11-15 DIAGNOSIS — E118 Type 2 diabetes mellitus with unspecified complications: Secondary | ICD-10-CM | POA: Diagnosis not present

## 2017-11-15 DIAGNOSIS — E538 Deficiency of other specified B group vitamins: Secondary | ICD-10-CM | POA: Diagnosis not present

## 2017-11-15 DIAGNOSIS — N183 Chronic kidney disease, stage 3 unspecified: Secondary | ICD-10-CM

## 2017-11-15 DIAGNOSIS — M170 Bilateral primary osteoarthritis of knee: Secondary | ICD-10-CM | POA: Insufficient documentation

## 2017-11-15 DIAGNOSIS — K3184 Gastroparesis: Secondary | ICD-10-CM | POA: Diagnosis not present

## 2017-11-15 DIAGNOSIS — E1143 Type 2 diabetes mellitus with diabetic autonomic (poly)neuropathy: Secondary | ICD-10-CM | POA: Diagnosis not present

## 2017-11-15 DIAGNOSIS — E611 Iron deficiency: Secondary | ICD-10-CM | POA: Diagnosis not present

## 2017-11-15 DIAGNOSIS — Z1331 Encounter for screening for depression: Secondary | ICD-10-CM | POA: Diagnosis not present

## 2017-11-15 LAB — GLUCOSE, POCT (MANUAL RESULT ENTRY): POC GLUCOSE: 185 mg/dL — AB (ref 70–99)

## 2017-11-15 MED ORDER — ISOSORBIDE MONONITRATE ER 120 MG PO TB24: ORAL_TABLET | ORAL | 3 refills | Status: DC

## 2017-11-15 MED ORDER — CLOPIDOGREL BISULFATE 75 MG PO TABS: 75.0000 mg | ORAL_TABLET | Freq: Every day | ORAL | 1 refills | Status: DC

## 2017-11-15 MED ORDER — ATORVASTATIN CALCIUM 80 MG PO TABS: ORAL_TABLET | ORAL | 3 refills | Status: DC

## 2017-11-15 MED ORDER — FUROSEMIDE 80 MG PO TABS: 80.0000 mg | ORAL_TABLET | Freq: Two times a day (BID) | ORAL | 5 refills | Status: DC

## 2017-11-15 NOTE — Progress Notes (Signed)
Follow up-  Colonoscopy- May 6,2019 Mammogram-11/15/17 tomorrow

## 2017-11-15 NOTE — Patient Instructions (Signed)
Continue to use Reglan only as needed for nausea/vomiting.  Continue to limit salt in the foods.  Avoid use of over-the-counter pain medications as these can make your kidney function worse.

## 2017-11-15 NOTE — Progress Notes (Signed)
Patient ID: [name], female    DOB: February 08, 1956  MRN: [id number]  CC: Follow-up   Subjective: [name] is a 62 y.o. female who presents for chronic disease management. Her concerns today include:  Pt with hx of DM with probable gastroparesis, retinopathy, HTN, HL, CAD, chronic diastolic CHF, obesity, IDA, OA knees, Vit B 12 def  1.  Pos FIT: He has seen Dr. Fuller Plan.  Has c-scope schedule for next month.  2.  Vit B 12:  Taking vitamin B12 1000 mcg daily  3.  DM:  Checks BS every now and then.  Range 175-190.  "I've cut out my rice, noodles, spaghetti." Gastroparesis:  She was able to decrease Reglan to BID and some days not at all.   4.  Pos dep/anxiety screen:  Attributes this to recent loss of her 21 yr niece in [place].  She will bury this weekend. Family is very close. Sleeping okay; good family support.  Denies suicidal ideation  5.  CHF/CAD:  She has not had to use the Zaroxolyn.  LE edema goes and comes, more so when moving around. -she limits salt in food.  No scale at home No CP.  Occasional palpitations; dizziness if she gets up too quickly.    6.  CKD stage 3:  Stable based on last blood test.  Not on any NSAIDs  Patient Active Problem List   Diagnosis Date Noted  . Drug-induced constipation 09/15/2017  . Primary osteoarthritis of both knees 04/05/2017  . Chronic bilateral low back pain 03/07/2017  . Chronic pain of right knee 03/07/2017  . Positive colorectal cancer screening using Cologuard test 03/07/2017  . Vitamin B12 deficiency 12/09/2016  . Bilateral hand numbness 11/26/2016  . Incidental lung nodule, > 78m and < 830m02/13/2018  . Unstable angina pectoris ([facility]  . Vitreous hemorrhage of right eye ([facility]CM[place]  . Angina pectoris ([facility]CR[place]07/04/2016  . Diabetic gastroparesis associated with type 2 diabetes mellitus ([facility]02/05/2016  . Controlled type 2 diabetes mellitus with complication, with long-term current use of insulin (HCR[place]02/05/2016  . Chronic  renal insufficiency, stage III (moderate) ([facility]CE[place]08/17/2016  . Unstable angina (HCE[place]08/16/2016  . Prolonged Q-T interval on ECG 08/10/2013  . Dyslipidemia 04/02/2013  . Chronic diastolic heart failure, NYHA class 3 (HC[facility] [place]09/01/2012  . Presumed OSA (obstructive sleep apnea) 04/06/2012  . NSTEMI  post-op 04/04/12-medical Rx 04/04/2012  . Obesity, morbid 01/17/2012  . GERD 08/21/2009  . DIZZINESS 08/21/2009  . Post-menopausal bleeding 03/20/2009  . PERIPHERAL EDEMA 03/20/2009  . Coronary atherosclerosis-not a surgical or PCI candidate 2010 02/27/2009  . SHOULDER PAIN, LEFT 10/20/2007  . Proliferative diabetic retinopathy (HC[facility]od[facility]03/01/2008  . DETACHED RETINA, BILATERAL, HX OF 09/07/2007  . History of chronic Iron defeicency anemia with acute post op anemia, transfused after debridment 04/04/12 04/17/2007  . Essential hypertension 04/17/2007     Current Outpatient Medications on File Prior to Visit  Medication Sig Dispense Refill  . ACCU-CHEK SOFTCLIX LANCETS lancets Use as instructed for 3 times daily blood glucose monitoring 100 each 5  . acetaminophen (TYLENOL) 500 MG tablet Take 1 tablet (500 mg total) by mouth every 6 (six) hours as needed. 30 tablet 0  . acetaZOLAMIDE (DIAMOX) 500 MG capsule Take 500 mg by mouth 2 (two) times daily.    . Marland Kitchenspirin 81 MG tablet Take 81 mg by mouth daily.    . baclofen (LIORESAL) 10 MG tablet Take 1 tablet (10 mg total) by mouth  at bedtime as needed for muscle spasms. 30 each 5  . brimonidine-timolol (COMBIGAN) 0.2-0.5 % ophthalmic solution Place 1 drop into both eyes every 12 (twelve) hours.    . calcium carbonate (TUMS - DOSED IN MG ELEMENTAL CALCIUM) 500 MG chewable tablet Chew 1 tablet by mouth 2 (two) times daily as needed for indigestion.     . Cyanocobalamin (VITAMIN B-12) 1000 MCG TABS Take 1,000 mcg by mouth daily. 90 tablet 1  . diltiazem (DILACOR XR) 240 MG 24 hr capsule Take 1 capsule (240 mg total) by mouth daily. 90 capsule 3  . ferrous sulfate  325 (65 FE) MG tablet Take 325 mg by mouth daily with breakfast.    . hydrocortisone cream 1 % Apply 1 application topically daily as needed for itching.    . insulin lispro protamine-lispro (HUMALOG MIX 75/25) (75-25) 100 UNIT/ML SUSP injection INJECT 50 UNITS INTO THE SKIN 2 TIMES DAILY WITH A MEAL 40 mL 11  . losartan (COZAAR) 50 MG tablet Take 1 tablet (50 mg total) by mouth daily. 30 tablet 5  . LUMIGAN 0.01 % SOLN Place 1 drop into both eyes 2 (two) times daily.    . metoCLOPramide (REGLAN) 5 MG tablet 1 tab twice a day before the two larges meals of the day 60 tablet 5  . metolazone (ZAROXOLYN) 2.5 MG tablet 1 tab once every two weeks PRN swelling lower extremities 10 tablet 3  . metoprolol (TOPROL-XL) 200 MG 24 hr tablet TAKE 1 TABLET BY MOUTH ONCE DAILY 90 tablet 2  . nitroGLYCERIN (NITROSTAT) 0.4 MG SL tablet DISSOLVE ONE TABLET UNDER THE TONGUE EVERY 5 MINUTES AS NEEDED FOR CHEST PAIN.  DO NOT EXCEED A TOTAL OF 3 DOSES IN 15 MINUTES NOW 25 tablet 3  . pantoprazole (PROTONIX) 40 MG tablet TAKE ONE TABLET BY MOUTH ONCE DAILY 30 tablet 2  . polyethylene glycol (MIRALAX / GLYCOLAX) packet Take 17 g by mouth daily as needed for mild constipation.    . polyvinyl alcohol (LIQUIFILM TEARS) 1.4 % ophthalmic solution Place 1 drop into both eyes daily as needed. For dry eyes    . potassium chloride SA (K-DUR,KLOR-CON) 20 MEQ tablet Take 1 tablet (20 mEq total) by mouth daily. 60 tablet 5  . RANEXA 500 MG 12 hr tablet TAKE ONE TABLET BY MOUTH TWICE DAILY 60 tablet 2  . timolol (TIMOPTIC) 0.5 % ophthalmic solution Place 1 drop into both eyes every 12 (twelve) hours. 10 mL 12  . Blood Glucose Monitoring Suppl (ACCU-CHEK AVIVA PLUS) w/Device KIT Used as directed 1 kit 0  . Blood Pressure Monitoring (BLOOD PRESSURE MONITOR/ARM) DEVI 1 each by Does not apply route daily. ICD 10, I10 and I50.32 1 Device 0  . brimonidine (ALPHAGAN) 0.2 % ophthalmic solution Place 1 drop into both eyes every 12 (twelve)  hours. 5 mL 12  . docusate sodium (COLACE) 100 MG capsule Take 1 capsule (100 mg total) by mouth 2 (two) times daily. 60 capsule 3  . glucose blood test strip Use TID before meals / Dx E11.9 100 each 12  . glucose monitoring kit (FREESTYLE) monitoring kit 1 each by Does not apply route 4 (four) times daily - after meals and at bedtime. 1 each 1  . Insulin Syringe-Needle U-100 (RELION INSULIN SYRINGE) 31G X 15/64" 1 ML MISC Use as directed for twice daily insulin adminstration. E11.9 100 each 5  . Menthol-Methyl Salicylate (MUSCLE RUB EX) Apply 1 application topically daily as needed (pain).  No current facility-administered medications on file prior to visit.     Allergies  Allergen Reactions  . Ativan [Lorazepam] Anaphylaxis  . Orange Fruit [Citrus] Anaphylaxis    Pt stated throat swelling, itching    Social History   Socioeconomic History  . Marital status: Married    Spouse name: Not on file  . Number of children: 0  . Years of education: Not on file  . Highest education level: Not on file  Occupational History  . Occupation: disabled  Social Needs  . Financial resource strain: Not on file  . Food insecurity:    Worry: Not on file    Inability: Not on file  . Transportation needs:    Medical: Not on file    Non-medical: Not on file  Tobacco Use  . Smoking status: Never Smoker  . Smokeless tobacco: Never Used  Substance and Sexual Activity  . Alcohol use: No  . Drug use: No  . Sexual activity: Yes  Lifestyle  . Physical activity:    Days per week: Not on file    Minutes per session: Not on file  . Stress: Not on file  Relationships  . Social connections:    Talks on phone: Not on file    Gets together: Not on file    Attends religious service: Not on file    Active member of club or organization: Not on file    Attends meetings of clubs or organizations: Not on file    Relationship status: Not on file  . Intimate partner violence:    Fear of current or ex  partner: Not on file    Emotionally abused: Not on file    Physically abused: Not on file    Forced sexual activity: Not on file  Other Topics Concern  . Not on file  Social History Narrative  . Not on file    Family History  Problem Relation Age of Onset  . Diabetes Mother   . Heart disease Mother   . Heart attack Mother   . Hypertension Mother   . Breast cancer Mother   . Colon polyps Father   . Diabetes Father   . Heart disease Father   . Heart attack Father   . Stroke Father   . Hypertension Father   . Diabetes Brother   . Diabetes Sister   . Heart disease Brother   . Heart disease Sister   . Diabetes Maternal Grandmother   . Hypertension Maternal Grandmother   . Hypertension Brother   . Hypertension Sister   . Breast cancer Cousin   . Colon cancer Cousin     Past Surgical History:  Procedure Laterality Date  . CARDIAC CATHETERIZATION  July 2010  . CATARACT EXTRACTION, BILATERAL Bilateral   . DILATION AND CURETTAGE OF UTERUS    . EYE SURGERY Bilateral "several"  . INCISION AND DRAINAGE PERIRECTAL ABSCESS  04/03/2012    ROS: Review of Systems  PHYSICAL EXAM: BP (!) 145/77   Pulse 64   Temp 98.7 F (37.1 C) (Oral)   Resp 20   LMP 04/02/2012   SpO2 96%   Wt Readings from Last 3 Encounters:  11/07/17 296 lb 2 oz (134.3 kg)  09/15/17 293 lb 9.6 oz (133.2 kg)  03/07/17 291 lb (132 kg)    Physical Exam  General appearance - alert, well appearing, and in no distress Mental status - alert, oriented to person, place, and time, normal mood, behavior, speech, dress, motor activity,  and thought processes Neck - supple, no significant adenopathy Chest - clear to auscultation, no wheezes, rales or rhonchi, symmetric air entry Heart - normal rate, regular rhythm, normal S1, S2, no murmurs, rubs, clicks or gallops Extremities -trace lower extremity edema.  1+ ankle edema  Results for orders placed or performed in visit on 11/15/17  Glucose (CBG)  Result Value  Ref Range   POC Glucose 185 (A) 70 - 99 mg/dl   Lab Results  Component Value Date   HGBA1C 7.3 (H) 09/15/2017    Depression screen PHQ 2/9 11/15/2017 09/15/2017 03/07/2017  Decreased Interest '2 2 2  ' Down, Depressed, Hopeless '2 2 1  ' PHQ - 2 Score '4 4 3  ' Altered sleeping 2 0 1  Tired, decreased energy '2 3 3  ' Change in appetite '2 2 1  ' Feeling bad or failure about yourself  '2 2 1  ' Trouble concentrating '2 2 1  ' Moving slowly or fidgety/restless '2 2 1  ' Suicidal thoughts 2 0 0  PHQ-9 Score '18 15 11  ' Some recent data might be hidden   GAD 7 : Generalized Anxiety Score 11/15/2017 09/15/2017 03/07/2017 01/04/2017  Nervous, Anxious, on Edge 2 0 1 1  Control/stop worrying 2 0 1 2  Worry too much - different things '2 2 1 2  ' Trouble relaxing '2 2 1 1  ' Restless 2 0 1 1  Easily annoyed or irritable '2 2 2 2  ' Afraid - awful might happen '2 2 1 1  ' Total GAD 7 Score '14 8 8 10     ' Chemistry      Component Value Date/Time   NA 141 09/15/2017 1026   K 3.4 (L) 09/15/2017 1026   CL 104 09/15/2017 1026   CO2 20 09/15/2017 1026   BUN 15 09/15/2017 1026   CREATININE 1.31 (H) 09/15/2017 1026   CREATININE 1.30 (H) 09/28/2016 1004      Component Value Date/Time   CALCIUM 9.0 09/15/2017 1026   ALKPHOS 62 09/15/2017 1026   AST 9 09/15/2017 1026   ALT 11 09/15/2017 1026   BILITOT 0.3 09/15/2017 1026      ASSESSMENT AND PLAN: 1. Controlled type 2 diabetes mellitus with complication, with long-term current use of insulin ([place]) Commended her on changing her eating habits especially cutting back on the white carbohydrates.  Continue Humalog - Glucose (CBG)  2. Diabetic gastroparesis ([place]) I recommend changing Reglan to as needed.  We had cut back from 3 times daily to twice daily on last visit.  She reports that she now is not having to use it every day which is good.  3. Coronary artery disease involving native coronary artery of native heart without angina pectoris Clinically stable - isosorbide  mononitrate (IMDUR) 120 MG 24 hr tablet; TAKE 1 TABLET BY MOUTH ONCE DAILY  Dispense: 90 tablet; Refill: 3 - atorvastatin (LIPITOR) 80 MG tablet; TAKE ONE TABLET BY MOUTH ONCE DAILY IN THE EVENING AT  6PM  Dispense: 90 tablet; Refill: 3 - clopidogrel (PLAVIX) 75 MG tablet; Take 1 tablet (75 mg total) by mouth daily.  Dispense: 90 tablet; Refill: 1  4. Chronic diastolic heart failure, NYHA class 3 (HCC) -Slightly fluid overload with pedal edema.  Continue furosemide.  Zaroxolyn as needed but no more than once every 2 weeks - furosemide (LASIX) 80 MG tablet; Take 1 tablet (80 mg total) by mouth 2 (two) times daily.  Dispense: 60 tablet; Refill: 5  5. Essential hypertension   6. Positive depression screening 7.  Grief reaction Good family support.  She does not feel that she needs to be on any medication at this time.  She declined speaking with our clinical social worker  8. CKD (chronic kidney disease) stage 3, GFR 30-59 ml/min (HCC) Avoid NSAIDs - Potassium  9. Vitamin B12 deficiency Over-the-counter vitamin B12  10. Iron deficiency - Iron, TIBC and Ferritin Panel   Patient was given the opportunity to ask questions.  Patient verbalized understanding of the plan and was able to repeat key elements of the plan.   Orders Placed This Encounter  Procedures  . Potassium  . Iron, TIBC and Ferritin Panel  . Glucose (CBG)     Requested Prescriptions   Signed Prescriptions Disp Refills  . isosorbide mononitrate (IMDUR) 120 MG 24 hr tablet 90 tablet 3    Sig: TAKE 1 TABLET BY MOUTH ONCE DAILY  . furosemide (LASIX) 80 MG tablet 60 tablet 5    Sig: Take 1 tablet (80 mg total) by mouth 2 (two) times daily.  Marland Kitchen atorvastatin (LIPITOR) 80 MG tablet 90 tablet 3    Sig: TAKE ONE TABLET BY MOUTH ONCE DAILY IN THE EVENING AT  6PM  . clopidogrel (PLAVIX) 75 MG tablet 90 tablet 1    Sig: Take 1 tablet (75 mg total) by mouth daily.    Return in about 3 months (around 02/14/2018).  Karle Plumber, MD, FACP

## 2017-11-16 ENCOUNTER — Ambulatory Visit
Admission: RE | Admit: 2017-11-16 | Discharge: 2017-11-16 | Disposition: A | Discharge status: Home / Self Care | Payer: Medicare Other | Source: Ambulatory Visit | Attending: *Deleted | Admitting: *Deleted

## 2017-11-16 DIAGNOSIS — Z1231 Encounter for screening mammogram for malignant neoplasm of breast: Secondary | ICD-10-CM

## 2017-11-18 ENCOUNTER — Other Ambulatory Visit: Payer: Self-pay | Admitting: Family Medicine

## 2017-11-18 DIAGNOSIS — R928 Other abnormal and inconclusive findings on diagnostic imaging of breast: Secondary | ICD-10-CM

## 2017-11-23 ENCOUNTER — Other Ambulatory Visit: Payer: Self-pay | Admitting: Internal Medicine

## 2017-11-23 DIAGNOSIS — R928 Other abnormal and inconclusive findings on diagnostic imaging of breast: Secondary | ICD-10-CM

## 2017-11-25 ENCOUNTER — Other Ambulatory Visit: Payer: Self-pay | Admitting: Internal Medicine

## 2017-11-25 ENCOUNTER — Ambulatory Visit
Admission: RE | Admit: 2017-11-25 | Discharge: 2017-11-25 | Disposition: A | Discharge status: Home / Self Care | Payer: Medicare Other | Source: Ambulatory Visit | Attending: *Deleted | Admitting: *Deleted

## 2017-11-25 DIAGNOSIS — R928 Other abnormal and inconclusive findings on diagnostic imaging of breast: Secondary | ICD-10-CM

## 2017-11-25 DIAGNOSIS — R921 Mammographic calcification found on diagnostic imaging of breast: Secondary | ICD-10-CM

## 2017-11-28 ENCOUNTER — Telehealth: Payer: Self-pay | Admitting: Cardiovascular Disease

## 2017-11-28 NOTE — Telephone Encounter (Signed)
Hmm, I do think that I filled those out (I signed some [employer] stuff, just do not remember if they were hers). Please ask Chelley. MCr

## 2017-11-28 NOTE — Telephone Encounter (Signed)
Patient called in stating that she had mailed her [employer] papers in 3 weeks ago to be signed and mailed back to [employer]. She was calling to get an update. She only has a few weeks left and this is her main form of transportation. Message routed to the provider and his assistant.

## 2017-11-28 NOTE — Telephone Encounter (Signed)
Called patient, question regarding additional mobility aids answered. She uses a manual wheelchair, cane, and walker.  Form given to medical records to be further processed by [employer].

## 2017-11-28 NOTE — Telephone Encounter (Signed)
New message:      Pt states she needs her [employer] papers filled out to continue riding this bus to get to her appointments. Pt states they told her the paper was re-routed to someone but has not heard anything back as of yet. Pt's status will run out in a couple of weeks so pt needs this paper back asap.

## 2017-12-01 ENCOUNTER — Telehealth: Payer: Self-pay | Admitting: Cardiovascular Disease

## 2017-12-01 NOTE — Telephone Encounter (Signed)
11/30/17 - Faxed over [employer] Eligibility Form to [phone]. Received confirmation. Spoke to patient. Patient wants original form mailed to home address. Placed in mail today. 12/01/17 ab

## 2017-12-05 ENCOUNTER — Encounter: Payer: Medicare Other | Admitting: Gastroenterology

## 2017-12-06 ENCOUNTER — Ambulatory Visit
Admission: RE | Admit: 2017-12-06 | Discharge: 2017-12-06 | Disposition: A | Discharge status: Home / Self Care | Payer: Medicare Other | Source: Ambulatory Visit | Attending: Internal Medicine | Admitting: Internal Medicine

## 2017-12-06 DIAGNOSIS — N6011 Diffuse cystic mastopathy of right breast: Secondary | ICD-10-CM | POA: Diagnosis not present

## 2017-12-06 DIAGNOSIS — R921 Mammographic calcification found on diagnostic imaging of breast: Secondary | ICD-10-CM | POA: Diagnosis not present

## 2017-12-12 ENCOUNTER — Other Ambulatory Visit: Payer: Self-pay | Admitting: Cardiovascular Disease

## 2017-12-12 ENCOUNTER — Encounter: Payer: Self-pay | Admitting: Internal Medicine

## 2017-12-12 DIAGNOSIS — N6011 Diffuse cystic mastopathy of right breast: Secondary | ICD-10-CM | POA: Insufficient documentation

## 2017-12-13 NOTE — Telephone Encounter (Signed)
Rx sent to pharmacy   

## 2017-12-20 ENCOUNTER — Encounter: Payer: Self-pay | Admitting: Cardiovascular Disease

## 2017-12-20 ENCOUNTER — Ambulatory Visit: Payer: Medicare Other | Admitting: Cardiovascular Disease

## 2017-12-20 VITALS — BP 141/80 | HR 70 | Ht 63.0 in | Wt 291.2 lb

## 2017-12-20 DIAGNOSIS — E785 Hyperlipidemia, unspecified: Secondary | ICD-10-CM | POA: Diagnosis not present

## 2017-12-20 DIAGNOSIS — N183 Chronic kidney disease, stage 3 unspecified: Secondary | ICD-10-CM

## 2017-12-20 DIAGNOSIS — I25118 Atherosclerotic heart disease of native coronary artery with other forms of angina pectoris: Secondary | ICD-10-CM | POA: Diagnosis not present

## 2017-12-20 DIAGNOSIS — E669 Obesity, unspecified: Secondary | ICD-10-CM | POA: Diagnosis not present

## 2017-12-20 DIAGNOSIS — E138 Other specified diabetes mellitus with unspecified complications: Secondary | ICD-10-CM | POA: Diagnosis not present

## 2017-12-20 DIAGNOSIS — I10 Essential (primary) hypertension: Secondary | ICD-10-CM | POA: Diagnosis not present

## 2017-12-20 DIAGNOSIS — Z794 Long term (current) use of insulin: Secondary | ICD-10-CM | POA: Diagnosis not present

## 2017-12-20 DIAGNOSIS — I5032 Chronic diastolic (congestive) heart failure: Secondary | ICD-10-CM

## 2017-12-20 DIAGNOSIS — E1122 Type 2 diabetes mellitus with diabetic chronic kidney disease: Secondary | ICD-10-CM | POA: Diagnosis not present

## 2017-12-20 MED ORDER — POTASSIUM CHLORIDE CRYS ER 20 MEQ PO TBCR: 20.0000 meq | EXTENDED_RELEASE_TABLET | Freq: Every day | ORAL | 5 refills | Status: DC

## 2017-12-20 NOTE — Patient Instructions (Signed)
Dr Sallyanne Kuster has recommended making the following medication changes: 1. STOP Ranexa  Your physician recommends that you return for lab work TODAY.  Dr Sallyanne Kuster recommends that you schedule a follow-up appointment in 6 months. You will receive a reminder letter in the mail two months in advance. If you don't receive a letter, please call our office to schedule the follow-up appointment.  If you need a refill on your cardiac medications before your next appointment, please call your pharmacy.    You are cleared for your colonoscopy. Please HOLD Clopidogrel for 5 days prior to your colonoscopy.

## 2017-12-20 NOTE — Progress Notes (Signed)
Patient ID: [name], female   DOB: 23-Nov-1955, 62 y.o.   MRN: [id number]    Cardiology Office Note    Date:  12/21/2017   ID:  Elizabeth, Burns March 11, 1956, MRN [id number]  PCP:  Ladell Pier, MD  Cardiologist:   Sanda Klein, MD   Chief Complaint  Patient presents with  . Medical Clearance    History of Present Illness:  [name] is a 62 y.o. female with super obesity, long-standing severe diabetes mellitus with multiple microvascular and macrovascular complications. She has extensive multivessel coronary artery disease with diffuse involvement, making her a poor candidate for either surgical or percutaneous revascularization.  She has chronic diastolic heart failure as well as chronic kidney disease.  She ran out of her Ranexa about 2 weeks ago but has not developed any angina.  She has not required sublingual nitroglycerin.  She is more edematous than her usual baseline.  For most of 2018 her weight was 270-280 pounds and today she weighs 291 pounds.  She has NYHA functional class III exertional dyspnea, does not have orthopnea or PND.  She has 3+ edema bilaterally to the knees.  She has not taken any metolazone in months.  Unfortunately, she has also run out of her potassium supplement for at least the last 3-4 weeks.  She denies palpitations, muscle cramps or excessive fatigue.  She has not had syncope or palpitations.  She is planning a colonoscopy with Dr. Fuller Plan.  She is on chronic clopidogrel which will need to be interrupted.  Glucose control remains generally good.  In February her hemoglobin A1c was 7.3%.  Her lipids are also generally favorable with an LDL of 56, chronically low HDL at 42, normal triglycerides.  In February her creatinine was 1.2, estimated GFR of 56.  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  She also has systemic hypertension and left ventricular hypertrophy with chronic diastolic heart failure in the setting of morbid obesity and obstructive sleep apnea. Her most recent left ventricular ejection fraction was 55-60% by the echocardiogram performed in July 2017. Estimated systolic PA pressure was 37 mmHg.  Past Medical History:  Diagnosis Date  . Anemia   . Blind right eye   . Chronic back pain    "my whole back" (03/18/2015)  . Chronic diastolic heart failure ([place]) 9/13   echo 03/20/15 LV Ef of 60-65%, grade 2DD and PA peak pressure: 36 mm Hg   . Coronary artery disease    Medical Rx  . Depression   . Diabetic retinopathy ([place])   . GERD (gastroesophageal reflux disease)   . Glaucoma   . Headache   . History of blood transfusion "several"   "related to menopause"  . Hyperlipemia   . Hypertension   . Morbid obesity with BMI of 40.0-44.9, adult ([place])   . Myocardial infarction ([place]) "several"  . Neuropathy    hands    diabetic neuropthy  . Obstructive sleep apnea    "they say I do; I say I don't; no mask" (03/18/2015)  . Renal insufficiency   . Type II diabetes mellitus (HCC)    insulin dependent    Past Surgical History:  Procedure Laterality Date  . CARDIAC CATHETERIZATION  July 2010  . CATARACT EXTRACTION, BILATERAL Bilateral   . DILATION AND CURETTAGE OF UTERUS    . EYE SURGERY Bilateral "several"  . INCISION AND  DRAINAGE PERIRECTAL ABSCESS  04/03/2012    Outpatient Medications Prior to Visit  Medication Sig Dispense Refill  . ACCU-CHEK SOFTCLIX LANCETS lancets Use as instructed for 3 times daily blood glucose monitoring 100 each 5  . acetaminophen (TYLENOL) 500 MG tablet Take 1 tablet (500 mg total) by mouth every 6 (six) hours as needed. 30 tablet 0  . acetaZOLAMIDE (DIAMOX) 500 MG capsule Take 500 mg by mouth 2 (two) times daily.    Marland Kitchen aspirin 81 MG tablet Take 81 mg by mouth daily.    Marland Kitchen atorvastatin (LIPITOR) 80 MG tablet TAKE ONE TABLET BY MOUTH ONCE DAILY  IN THE EVENING AT  6PM 90 tablet 3  . baclofen (LIORESAL) 10 MG tablet Take 1 tablet (10 mg total) by mouth at bedtime as needed for muscle spasms. 30 each 5  . Blood Glucose Monitoring Suppl (ACCU-CHEK AVIVA PLUS) w/Device KIT Used as directed 1 kit 0  . Blood Pressure Monitoring (BLOOD PRESSURE MONITOR/ARM) DEVI 1 each by Does not apply route daily. ICD 10, I10 and I50.32 1 Device 0  . brimonidine (ALPHAGAN) 0.2 % ophthalmic solution Place 1 drop into both eyes every 12 (twelve) hours. 5 mL 12  . brimonidine-timolol (COMBIGAN) 0.2-0.5 % ophthalmic solution Place 1 drop into both eyes every 12 (twelve) hours.    . calcium carbonate (TUMS - DOSED IN MG ELEMENTAL CALCIUM) 500 MG chewable tablet Chew 1 tablet by mouth 2 (two) times daily as needed for indigestion.     . clopidogrel (PLAVIX) 75 MG tablet Take 1 tablet (75 mg total) by mouth daily. 90 tablet 1  . Cyanocobalamin (VITAMIN B-12) 1000 MCG TABS Take 1,000 mcg by mouth daily. 90 tablet 1  . diltiazem (DILACOR XR) 240 MG 24 hr capsule Take 1 capsule (240 mg total) by mouth daily. 90 capsule 3  . docusate sodium (COLACE) 100 MG capsule Take 1 capsule (100 mg total) by mouth 2 (two) times daily. 60 capsule 3  . ferrous sulfate 325 (65 FE) MG tablet Take 325 mg by mouth daily with breakfast.    . furosemide (LASIX) 80 MG tablet Take 1 tablet (80 mg total) by mouth 2 (two) times daily. 60 tablet 5  . glucose blood test strip Use TID before meals / Dx E11.9 100 each 12  . glucose monitoring kit (FREESTYLE) monitoring kit 1 each by Does not apply route 4 (four) times daily - after meals and at bedtime. 1 each 1  . hydrocortisone cream 1 % Apply 1 application topically daily as needed for itching.    . insulin lispro protamine-lispro (HUMALOG MIX 75/25) (75-25) 100 UNIT/ML SUSP injection INJECT 50 UNITS INTO THE SKIN 2 TIMES DAILY WITH A MEAL 40 mL 11  . Insulin Syringe-Needle U-100 (RELION INSULIN SYRINGE) 31G X 15/64" 1 ML MISC Use as directed for  twice daily insulin adminstration. E11.9 100 each 5  . isosorbide mononitrate (IMDUR) 120 MG 24 hr tablet TAKE 1 TABLET BY MOUTH ONCE DAILY 90 tablet 3  . losartan (COZAAR) 50 MG tablet Take 1 tablet (50 mg total) by mouth daily. 30 tablet 5  . LUMIGAN 0.01 % SOLN Place 1 drop into both eyes 2 (two) times daily.    . Menthol-Methyl Salicylate (MUSCLE RUB EX) Apply 1 application topically daily as needed (pain).    Marland Kitchen metoCLOPramide (REGLAN) 5 MG tablet 1 tab twice a day before the two larges meals of the day 60 tablet 5  . metolazone (ZAROXOLYN) 2.5 MG tablet 1 tab  once every two weeks PRN swelling lower extremities 10 tablet 3  . metoprolol (TOPROL-XL) 200 MG 24 hr tablet TAKE 1 TABLET BY MOUTH ONCE DAILY 90 tablet 2  . nitroGLYCERIN (NITROSTAT) 0.4 MG SL tablet DISSOLVE ONE TABLET UNDER THE TONGUE EVERY 5 MINUTES AS NEEDED FOR CHEST PAIN.  DO NOT EXCEED A TOTAL OF 3 DOSES IN 15 MINUTES NOW 25 tablet 3  . pantoprazole (PROTONIX) 40 MG tablet TAKE ONE TABLET BY MOUTH ONCE DAILY 30 tablet 2  . polyethylene glycol (MIRALAX / GLYCOLAX) packet Take 17 g by mouth daily as needed for mild constipation.    . polyvinyl alcohol (LIQUIFILM TEARS) 1.4 % ophthalmic solution Place 1 drop into both eyes daily as needed. For dry eyes    . timolol (TIMOPTIC) 0.5 % ophthalmic solution Place 1 drop into both eyes every 12 (twelve) hours. 10 mL 12  . potassium chloride SA (K-DUR,KLOR-CON) 20 MEQ tablet Take 1 tablet (20 mEq total) by mouth daily. 60 tablet 5  . ranolazine (RANEXA) 500 MG 12 hr tablet Take 1 tablet (500 mg total) by mouth 2 (two) times daily. 60 tablet 0   No facility-administered medications prior to visit.      Allergies:   Ativan [lorazepam] and Orange fruit [citrus]   Social History   Socioeconomic History  . Marital status: Married    Spouse name: Not on file  . Number of children: 0  . Years of education: Not on file  . Highest education level: Not on file  Occupational History  .  Occupation: disabled  Social Needs  . Financial resource strain: Not on file  . Food insecurity:    Worry: Not on file    Inability: Not on file  . Transportation needs:    Medical: Not on file    Non-medical: Not on file  Tobacco Use  . Smoking status: Never Smoker  . Smokeless tobacco: Never Used  Substance and Sexual Activity  . Alcohol use: No  . Drug use: No  . Sexual activity: Yes  Lifestyle  . Physical activity:    Days per week: Not on file    Minutes per session: Not on file  . Stress: Not on file  Relationships  . Social connections:    Talks on phone: Not on file    Gets together: Not on file    Attends religious service: Not on file    Active member of club or organization: Not on file    Attends meetings of clubs or organizations: Not on file    Relationship status: Not on file  Other Topics Concern  . Not on file  Social History Narrative  . Not on file     Family History:  The patient's family history includes Breast cancer in her cousin and mother; Colon cancer in her cousin; Colon polyps in her father; Diabetes in her brother, father, maternal grandmother, mother, and sister; Heart attack in her father and mother; Heart disease in her brother, father, mother, and sister; Hypertension in her brother, father, maternal grandmother, mother, and sister; Stroke in her father.   ROS:   Please see the history of present illness.    ROS All other systems reviewed and are negative.   PHYSICAL EXAM:   VS:  BP (!) 141/80   Pulse 70   Ht 5' 3" (1.6 m)   Wt 291 lb 3.2 oz (132.1 kg)   LMP 04/02/2012   BMI 51.58 kg/m     General:  Alert, oriented x3, no distress, super obese Head: no evidence of trauma, PERRL, EOMI, no exophtalmos or lid lag, no myxedema, no xanthelasma; normal ears, nose and oropharynx Neck: Unable to evaluate jugular venous pulsations due to obesity; brisk carotid pulses without delay and no carotid bruits Chest: clear to auscultation, no  signs of consolidation by percussion or palpation, normal fremitus, symmetrical and full respiratory excursions Cardiovascular: normal position and quality of the apical impulse, regular rhythm, normal first and second heart sounds, no murmurs, rubs or gallops Abdomen: no tenderness or distention, no masses by palpation, no abnormal pulsatility or arterial bruits, normal bowel sounds, no hepatosplenomegaly Extremities: no clubbing, cyanosis; 3+ pitting edema to the knees bilaterally Neurological: grossly nonfocal Psych: Normal mood and affect  Wt Readings from Last 3 Encounters:  12/20/17 291 lb 3.2 oz (132.1 kg)  11/07/17 296 lb 2 oz (134.3 kg)  09/15/17 293 lb 9.6 oz (133.2 kg)      Studies/Labs Reviewed:   EKG:  EKG is not ordered today.    Recent Labs: 12/20/2017: ALT 8; BUN 14; Creatinine, Ser 1.20; Hemoglobin 12.8; Platelets 291; Potassium 3.5; Sodium 140   Lipid Panel    Component Value Date/Time   CHOL 107 12/20/2017 0949   TRIG 86 12/20/2017 0949   HDL 35 (L) 12/20/2017 0949   CHOLHDL 3.1 12/20/2017 0949   CHOLHDL 2.9 09/12/2015 1147   VLDL 12 09/12/2015 1147   LDLCALC 55 12/20/2017 0949    ASSESSMENT:    1. Dyslipidemia   2. Chronic diastolic heart failure, NYHA class 3 ([place])   3. CKD (chronic kidney disease) stage 3, GFR 30-59 ml/min (HCC)   4. Coronary artery disease of native artery of native heart with stable angina pectoris ([place])   5. Essential hypertension   6. Type 2 diabetes mellitus with stage 3 chronic kidney disease, with long-term current use of insulin ([place])   7. Super obesity      PLAN:  In order of problems listed above:  1. CHF: NYHA class 3, clearly hypervolemic.  Unfortunately she has been out of her potassium supplements for weeks and she may be hypokalemic.  We will not adjust her diuretics until we get her lab results.  After we make sure she has normal potassium and reevaluate her renal parameters, we may increase her loop diuretic and/or  add metolazone.  Send a copy of her labs to Dr. Joelyn Oms as well. 2. CKD stg. 3: Previous estimated creatinine baseline around 1.3.  Hemoglobin was 13.2 in February.  Need to maintain balance been to wean her heart failure and kidney problems. 3. CAD: due to diffuse nature of disease, not a candidate for either surgical or percutaneous revascularization.  Inadvertently off Ranexa for at least 2 weeks without any worsening of angina pectoris.  We will not renew this medication.  She is on beta-blockers and calcium channel blockers and long-acting nitrates. 4. HTN: Well-controlled usually, mildly hypertensive today, probably needs some diuresis, target systolic blood pressure under 130 5. DM: Glycemic control has been generally good. 6. HLP: Good lipid profile on current medications   Medication Adjustments/Labs and Tests Ordered: Current medicines are reviewed at length with the patient today.  Concerns regarding medicines are outlined above.  Medication changes, Labs and Tests ordered today are listed in the Patient Instructions below. Patient Instructions  Dr Sallyanne Kuster has recommended making the following medication changes: 1. STOP Ranexa  Your physician recommends that you return for lab work TODAY.  Dr Sallyanne Kuster recommends that  you schedule a follow-up appointment in 6 months. You will receive a reminder letter in the mail two months in advance. If you don't receive a letter, please call our office to schedule the follow-up appointment.  If you need a refill on your cardiac medications before your next appointment, please call your pharmacy.    You are cleared for your colonoscopy. Please HOLD Clopidogrel for 5 days prior to your colonoscopy.      Signed, Sanda Klein, MD  12/21/2017 7:11 PM    [facility] [address], [place], [place]  [address] Phone: [phone]; Fax: [phone]

## 2017-12-21 ENCOUNTER — Telehealth: Payer: Self-pay | Admitting: Cardiovascular Disease

## 2017-12-21 ENCOUNTER — Encounter: Payer: Self-pay | Admitting: Cardiovascular Disease

## 2017-12-21 DIAGNOSIS — I10 Essential (primary) hypertension: Secondary | ICD-10-CM

## 2017-12-21 DIAGNOSIS — I5032 Chronic diastolic (congestive) heart failure: Secondary | ICD-10-CM

## 2017-12-21 LAB — LIPID PANEL
Chol/HDL Ratio: 3.1 ratio (ref 0.0–4.4)
Cholesterol, Total: 107 mg/dL (ref 100–199)
HDL: 35 mg/dL — AB (ref >39)
LDL CALC: 55 mg/dL (ref 0–99)
Triglycerides: 86 mg/dL (ref 0–149)
VLDL CHOLESTEROL CAL: 17 mg/dL (ref 5–40)

## 2017-12-21 LAB — CBC
HEMATOCRIT: 39.5 % (ref 34.0–46.6)
HEMOGLOBIN: 12.8 g/dL (ref 11.1–15.9)
MCH: 31.6 pg (ref 26.6–33.0)
MCHC: 32.4 g/dL (ref 31.5–35.7)
MCV: 98 fL — ABNORMAL HIGH (ref 79–97)
Platelets: 291 10*3/uL (ref 150–450)
RBC: 4.05 x10E6/uL (ref 3.77–5.28)
RDW: 13.6 % (ref 12.3–15.4)
WBC: 12.2 10*3/uL — ABNORMAL HIGH (ref 3.4–10.8)

## 2017-12-21 LAB — COMPREHENSIVE METABOLIC PANEL
ALBUMIN: 3.7 g/dL (ref 3.6–4.8)
ALT: 8 IU/L (ref 0–32)
AST: 11 IU/L (ref 0–40)
Albumin/Globulin Ratio: 1.2 (ref 1.2–2.2)
Alkaline Phosphatase: 60 IU/L (ref 39–117)
BUN/Creatinine Ratio: 12 (ref 12–28)
BUN: 14 mg/dL (ref 8–27)
Bilirubin Total: 0.4 mg/dL (ref 0.0–1.2)
CALCIUM: 9.2 mg/dL (ref 8.7–10.3)
CHLORIDE: 104 mmol/L (ref 96–106)
CO2: 22 mmol/L (ref 20–29)
CREATININE: 1.2 mg/dL — AB (ref 0.57–1.00)
GFR calc Af Amer: 56 mL/min/{1.73_m2} — ABNORMAL LOW (ref >59)
GFR calc non Af Amer: 48 mL/min/{1.73_m2} — ABNORMAL LOW (ref >59)
GLOBULIN, TOTAL: 3.2 g/dL (ref 1.5–4.5)
Glucose: 190 mg/dL — ABNORMAL HIGH (ref 65–99)
Potassium: 3.5 mmol/L (ref 3.5–5.2)
Sodium: 140 mmol/L (ref 134–144)
Total Protein: 6.9 g/dL (ref 6.0–8.5)

## 2017-12-21 LAB — HEMOGLOBIN A1C
Est. average glucose Bld gHb Est-mCnc: 131 mg/dL
HEMOGLOBIN A1C: 6.2 % — AB (ref 4.8–5.6)

## 2017-12-21 NOTE — Telephone Encounter (Signed)
New message ° ° °Please call with lab results °

## 2017-12-22 ENCOUNTER — Encounter (HOSPITAL_COMMUNITY): Payer: Self-pay

## 2017-12-22 ENCOUNTER — Other Ambulatory Visit: Payer: Self-pay

## 2017-12-22 NOTE — Progress Notes (Signed)
Cardiac clearance for colonoscopy epic note 12-20-17 and Plavix instructions

## 2017-12-30 ENCOUNTER — Telehealth: Payer: Self-pay | Admitting: Cardiovascular Disease

## 2017-12-30 NOTE — Telephone Encounter (Signed)
New message ° ° ° °Patient calling for lab results °

## 2017-12-30 NOTE — Telephone Encounter (Signed)
Forward to [employer]

## 2018-01-02 ENCOUNTER — Ambulatory Visit (HOSPITAL_COMMUNITY): Admission: RE | Admit: 2018-01-02 | Payer: Medicare Other | Source: Ambulatory Visit | Admitting: Gastroenterology

## 2018-01-02 ENCOUNTER — Telehealth: Payer: Self-pay

## 2018-01-02 HISTORY — DX: Dyspnea, unspecified: R06.00

## 2018-01-02 SURGERY — COLONOSCOPY WITH PROPOFOL
Anesthesia: Monitor Anesthesia Care

## 2018-01-02 NOTE — Telephone Encounter (Signed)
Left message for patient to call back  

## 2018-01-02 NOTE — Telephone Encounter (Signed)
-----   Message from Ladene Artist, MD sent at 01/02/2018  9:15 AM EDT ----- Regarding: RE: Cancel colonoscopy for Monday [name], If she wants to reschedule consider Miralax prep with Reglan 10 mg prior to both doses. MS  ----- Message ----- From: Alfredia Ferguson, PA-C Sent: 01/01/2018   2:15 PM To: Marlon Pel, RN, Ladene Artist, MD Subject: Cancel colonoscopy for Monday                  Pt called this am - vomiting several times  after she drank first bottle of Suprep . Not sure why she was drinking prep at 10 am - she says her paper did not say when to start prep ...  Scheduled for Colon at [facility] 10;30 am Monday - BMI 52  I offered to call her in another prep , Miralax prep , explained she would need to have a complete prep  In order to  Go through with Colonoscopy. She did not think she could tolerate  Drinking anything else prep wise today because of vomiting . Also  could not afford to pay for a prep . She decided to cancel colonoscopy for tomorrow .  [name] - pt will need to be rescheduled for Colonoscopy at hospital- will need something besides Suprep, and will need careful instructions on timing of taking prep !

## 2018-01-03 ENCOUNTER — Telehealth: Payer: Self-pay

## 2018-01-03 NOTE — Telephone Encounter (Signed)
Patient has been rescheduled for 02/13/18.  She will come for a pre-visit on 01/23/18 for new instructions.  Plavix permission was sent to cardiology.

## 2018-01-03 NOTE — Telephone Encounter (Signed)
[facility] Pre-operative Risk Assessment     Request for surgical clearance:     Endoscopy Procedure  What type of surgery is being performed?     Colonoscopy   When is this surgery scheduled?     02/13/18  What type of clearance is required ?   Pharmacy  Are there any medications that need to be held prior to surgery and how long? Plavix 5-7 days  Practice name and name of physician performing surgery?      [facility]  What is your office phone and fax number?      Phone- [phone]  Fax225 [phone]  Anesthesia type (None, local, MAC, general) ?       MAC

## 2018-01-03 NOTE — Telephone Encounter (Signed)
   Primary Cardiologist:Mihai Croitoru, MD  Chart reviewed as part of pre-operative protocol coverage. This pre-op clearance was addressed by Dr. Sallyanne Kuster in his recent office visit. His note states to patient, "You are cleared for your colonoscopy. Please HOLD Clopidogrel for 5 days prior to your colonoscopy."   Will route this bundled recommendation to requesting provider via Epic fax function. Please call with questions.  Charlie Pitter, PA-C 01/03/2018, 4:54 PM

## 2018-01-06 NOTE — Telephone Encounter (Signed)
See telephone note from 12/21/17.

## 2018-01-06 NOTE — Telephone Encounter (Signed)
Notes recorded by Diana Eves, CMA on 01/06/2018 at 5:33 PM EDT lmtcb ------  Notes recorded by Diana Eves, CMA on 12/28/2017 at 4:15 PM EDT lmtcb

## 2018-01-09 NOTE — Telephone Encounter (Signed)
Pt calling to return call from nurse.

## 2018-01-10 MED ORDER — POTASSIUM CHLORIDE CRYS ER 20 MEQ PO TBCR: 20.0000 meq | EXTENDED_RELEASE_TABLET | Freq: Two times a day (BID) | ORAL | 3 refills | Status: DC

## 2018-01-10 MED ORDER — FUROSEMIDE 40 MG PO TABS: 40.0000 mg | ORAL_TABLET | Freq: Two times a day (BID) | ORAL | 3 refills | Status: DC

## 2018-01-10 NOTE — Telephone Encounter (Signed)
Called patient with results. Patient verbalized understanding and agreed with plan.  Patient needs a new prescription for K+. She reports that the lasix tablets are too small to cut in half, will send in a prescription for a 40 mg tablet. She just got the 80 mg tablets refilled. Advised patient to take 1 80 mg tablet and 1 40 mg tablet for a total of 120 mg twice daily.  Labs ordered. Patient requested a reminder call in 3 weeks to have lab done.

## 2018-01-10 NOTE — Telephone Encounter (Signed)
Notes recorded by Sanda Klein, MD on 12/21/2017 at 8:07 AM EDT Please make sure she has restarted her potassium supplement and I would actually like to double that to 20 mEq twice daily. Then please ask her to increase the furosemide to 120 mg twice daily. Recheck BMET and Mg level in 3 weeks. ------  Notes recorded by Sanda Klein, MD on 12/21/2017 at 8:05 AM EDT A1c shows excellent glucose control. Cholesterol is great. Mildly elevated WBC seems to be a chronic finding

## 2018-01-21 ENCOUNTER — Other Ambulatory Visit: Payer: Self-pay | Admitting: Cardiovascular Disease

## 2018-01-23 ENCOUNTER — Other Ambulatory Visit: Payer: Self-pay

## 2018-01-23 ENCOUNTER — Ambulatory Visit (AMBULATORY_SURGERY_CENTER): Payer: Self-pay | Admitting: *Deleted

## 2018-01-23 VITALS — Ht 63.0 in | Wt 289.2 lb

## 2018-01-23 DIAGNOSIS — Z86010 Personal history of colonic polyps: Secondary | ICD-10-CM

## 2018-01-23 MED ORDER — METOCLOPRAMIDE HCL 10 MG PO TABS: 10.0000 mg | ORAL_TABLET | ORAL | 0 refills | Status: DC

## 2018-01-23 NOTE — Telephone Encounter (Signed)
Rx request sent to pharmacy.  

## 2018-01-23 NOTE — Progress Notes (Signed)
Denies allergies to eggs or soy products. Denies complications with sedation or anesthesia. Denies O2 use. Denies use of diet or weight loss medications.  Emmi instructions not given for colonoscopy, pt dpes not have a computer

## 2018-02-09 ENCOUNTER — Encounter (HOSPITAL_COMMUNITY): Payer: Self-pay

## 2018-02-09 ENCOUNTER — Other Ambulatory Visit: Payer: Self-pay

## 2018-02-13 ENCOUNTER — Ambulatory Visit (HOSPITAL_COMMUNITY): Payer: Medicare Other | Admitting: Anesthesiology

## 2018-02-13 ENCOUNTER — Encounter (HOSPITAL_COMMUNITY): Payer: Self-pay

## 2018-02-13 ENCOUNTER — Other Ambulatory Visit: Payer: Self-pay

## 2018-02-13 ENCOUNTER — Ambulatory Visit (HOSPITAL_COMMUNITY): Payer: Medicare Other

## 2018-02-13 ENCOUNTER — Ambulatory Visit (HOSPITAL_COMMUNITY)
Admission: RE | Admit: 2018-02-13 | Discharge: 2018-02-13 | Disposition: A | Discharge status: Home / Self Care | Payer: Medicare Other | Source: Ambulatory Visit | Attending: Gastroenterology | Admitting: Gastroenterology

## 2018-02-13 ENCOUNTER — Encounter (HOSPITAL_COMMUNITY)
Admission: RE | Disposition: A | Discharge status: Home / Self Care | Payer: Self-pay | Source: Ambulatory Visit | Attending: Gastroenterology

## 2018-02-13 DIAGNOSIS — Z794 Long term (current) use of insulin: Secondary | ICD-10-CM | POA: Insufficient documentation

## 2018-02-13 DIAGNOSIS — D124 Benign neoplasm of descending colon: Secondary | ICD-10-CM

## 2018-02-13 DIAGNOSIS — K219 Gastro-esophageal reflux disease without esophagitis: Secondary | ICD-10-CM | POA: Insufficient documentation

## 2018-02-13 DIAGNOSIS — Z79899 Other long term (current) drug therapy: Secondary | ICD-10-CM | POA: Insufficient documentation

## 2018-02-13 DIAGNOSIS — R195 Other fecal abnormalities: Secondary | ICD-10-CM | POA: Diagnosis not present

## 2018-02-13 DIAGNOSIS — E1122 Type 2 diabetes mellitus with diabetic chronic kidney disease: Secondary | ICD-10-CM | POA: Diagnosis not present

## 2018-02-13 DIAGNOSIS — I5032 Chronic diastolic (congestive) heart failure: Secondary | ICD-10-CM | POA: Diagnosis not present

## 2018-02-13 DIAGNOSIS — D120 Benign neoplasm of cecum: Secondary | ICD-10-CM | POA: Diagnosis not present

## 2018-02-13 DIAGNOSIS — I872 Venous insufficiency (chronic) (peripheral): Secondary | ICD-10-CM | POA: Diagnosis not present

## 2018-02-13 DIAGNOSIS — Z6841 Body Mass Index (BMI) 40.0 and over, adult: Secondary | ICD-10-CM | POA: Insufficient documentation

## 2018-02-13 DIAGNOSIS — F329 Major depressive disorder, single episode, unspecified: Secondary | ICD-10-CM | POA: Diagnosis not present

## 2018-02-13 DIAGNOSIS — D122 Benign neoplasm of ascending colon: Secondary | ICD-10-CM | POA: Diagnosis not present

## 2018-02-13 DIAGNOSIS — E785 Hyperlipidemia, unspecified: Secondary | ICD-10-CM | POA: Diagnosis not present

## 2018-02-13 DIAGNOSIS — I130 Hypertensive heart and chronic kidney disease with heart failure and stage 1 through stage 4 chronic kidney disease, or unspecified chronic kidney disease: Secondary | ICD-10-CM | POA: Diagnosis not present

## 2018-02-13 DIAGNOSIS — N183 Chronic kidney disease, stage 3 (moderate): Secondary | ICD-10-CM | POA: Insufficient documentation

## 2018-02-13 HISTORY — PX: POLYPECTOMY: SHX5525

## 2018-02-13 HISTORY — PX: IR VENIPUNCTURE 3YRS/OLDER BY MD: IMG2287

## 2018-02-13 HISTORY — PX: IR US GUIDE VASC ACCESS LEFT: IMG2389

## 2018-02-13 HISTORY — PX: BIOPSY: SHX5522

## 2018-02-13 HISTORY — PX: COLONOSCOPY WITH PROPOFOL: SHX5780

## 2018-02-13 SURGERY — COLONOSCOPY WITH PROPOFOL
Anesthesia: Monitor Anesthesia Care

## 2018-02-13 MED ORDER — LACTATED RINGERS IV SOLN
INTRAVENOUS | Status: DC
  Administered 2018-02-13: 11:00:00 via INTRAVENOUS

## 2018-02-13 MED ORDER — PROPOFOL 10 MG/ML IV BOLUS
INTRAVENOUS | Status: AC
  Filled 2018-02-13: qty 60

## 2018-02-13 MED ORDER — LIDOCAINE 2% (20 MG/ML) 5 ML SYRINGE
INTRAMUSCULAR | Status: DC | PRN
  Administered 2018-02-13: 100 mg via INTRAVENOUS

## 2018-02-13 MED ORDER — PROPOFOL 10 MG/ML IV BOLUS
INTRAVENOUS | Status: DC | PRN
  Administered 2018-02-13: 10 mg via INTRAVENOUS
  Administered 2018-02-13: 20 mg via INTRAVENOUS

## 2018-02-13 MED ORDER — LIDOCAINE HCL 1 % IJ SOLN
INTRAMUSCULAR | Status: AC
  Filled 2018-02-13: qty 20

## 2018-02-13 MED ORDER — PROPOFOL 500 MG/50ML IV EMUL
INTRAVENOUS | Status: DC | PRN
  Administered 2018-02-13: 100 ug/kg/min via INTRAVENOUS

## 2018-02-13 MED ORDER — PROPOFOL 10 MG/ML IV BOLUS: INTRAVENOUS | Status: DC | PRN

## 2018-02-13 SURGICAL SUPPLY — 21 items

## 2018-02-13 NOTE — Discharge Instructions (Signed)

## 2018-02-13 NOTE — H&P (Signed)
History of Present Illness: This is a 62 year old female referred by Ladell Pier, MD for the evaluation of Cologuard positive in 01/2017. She has constipation for several years.  Treated with prune juice and Ducolax daily.  She had iron deficiency diagnosed 7 years ago however her most recent iron studies were normal.  Mild B12 deficiency was diagnosed 1 year ago.  She takes Pepto-Bismol frequently for indigestion symptoms.  She has not previously undergone colonoscopy.  She states she was diagnosed with diabetic gastroparesis and is been maintained on metoclopramide and pantoprazole.  I cannot locate the results of the gastric emptying study in Epic.  She is maintained on Plavix.  Denies weight loss, abdominal pain, diarrhea, change in stool caliber, melena, hematochezia, nausea, vomiting, dysphagia, reflux symptoms, chest pain.         Allergies  Allergen Reactions  . Ativan [Lorazepam] Anaphylaxis  . Orange Fruit [Citrus] Anaphylaxis    Pt stated throat swelling, itching         Outpatient Medications Prior to Visit  Medication Sig Dispense Refill  . ACCU-CHEK SOFTCLIX LANCETS lancets Use as instructed for 3 times daily blood glucose monitoring 100 each 5  . acetaminophen (TYLENOL) 500 MG tablet Take 1 tablet (500 mg total) by mouth every 6 (six) hours as needed. 30 tablet 0  . acetaZOLAMIDE (DIAMOX) 500 MG capsule Take 500 mg by mouth 2 (two) times daily.    Marland Kitchen aspirin 81 MG tablet Take 81 mg by mouth daily.    Marland Kitchen atorvastatin (LIPITOR) 80 MG tablet TAKE ONE TABLET BY MOUTH ONCE DAILY IN THE EVENING AT  6PM 90 tablet 3  . baclofen (LIORESAL) 10 MG tablet Take 1 tablet (10 mg total) by mouth at bedtime as needed for muscle spasms. 30 each 5  . bismuth subsalicylate (PEPTO BISMOL) 262 MG/15ML suspension Take 30 mLs by mouth every 6 (six) hours as needed for indigestion.     . Blood Glucose Monitoring Suppl (ACCU-CHEK AVIVA PLUS) w/Device KIT Used as directed 1 kit 0  . Blood  Pressure Monitoring (BLOOD PRESSURE MONITOR/ARM) DEVI 1 each by Does not apply route daily. ICD 10, I10 and I50.32 1 Device 0  . brimonidine (ALPHAGAN) 0.2 % ophthalmic solution Place 1 drop into both eyes every 12 (twelve) hours. 5 mL 12  . brimonidine-timolol (COMBIGAN) 0.2-0.5 % ophthalmic solution Place 1 drop into both eyes every 12 (twelve) hours.    . calcium carbonate (TUMS - DOSED IN MG ELEMENTAL CALCIUM) 500 MG chewable tablet Chew 1 tablet by mouth 2 (two) times daily as needed for indigestion.     . clopidogrel (PLAVIX) 75 MG tablet Take 1 tablet (75 mg total) by mouth daily. 90 tablet 1  . Cyanocobalamin (VITAMIN B-12) 1000 MCG TABS Take 1,000 mcg by mouth daily. 90 tablet 1  . diltiazem (DILACOR XR) 240 MG 24 hr capsule Take 1 capsule (240 mg total) by mouth daily. 90 capsule 3  . docusate sodium (COLACE) 100 MG capsule Take 1 capsule (100 mg total) by mouth 2 (two) times daily. 60 capsule 3  . ferrous sulfate 325 (65 FE) MG tablet Take 325 mg by mouth daily with breakfast.    . glucose blood test strip Use TID before meals / Dx E11.9 100 each 12  . glucose monitoring kit (FREESTYLE) monitoring kit 1 each by Does not apply route 4 (four) times daily - after meals and at bedtime. 1 each 1  . hydrocortisone cream 1 % Apply 1  application topically daily as needed for itching.    . insulin lispro protamine-lispro (HUMALOG MIX 75/25) (75-25) 100 UNIT/ML SUSP injection INJECT 50 UNITS INTO THE SKIN 2 TIMES DAILY WITH A MEAL 40 mL 11  . Insulin Syringe-Needle U-100 (RELION INSULIN SYRINGE) 31G X 15/64" 1 ML MISC Use as directed for twice daily insulin adminstration. E11.9 100 each 5  . isosorbide mononitrate (IMDUR) 120 MG 24 hr tablet TAKE 1 TABLET BY MOUTH ONCE DAILY 90 tablet 3  . losartan (COZAAR) 50 MG tablet Take 1 tablet (50 mg total) by mouth daily. 30 tablet 5  . LUMIGAN 0.01 % SOLN Place 1 drop into both eyes 2 (two) times daily.    . Menthol-Methyl Salicylate (MUSCLE RUB  EX) Apply 1 application topically daily as needed (pain).    Marland Kitchen metoCLOPramide (REGLAN) 5 MG tablet 1 tab twice a day before the two larges meals of the day 60 tablet 5  . metolazone (ZAROXOLYN) 2.5 MG tablet 1 tab once every two weeks PRN swelling lower extremities 10 tablet 3  . metoprolol (TOPROL-XL) 200 MG 24 hr tablet TAKE 1 TABLET BY MOUTH ONCE DAILY 90 tablet 2  . nitroGLYCERIN (NITROSTAT) 0.4 MG SL tablet DISSOLVE ONE TABLET UNDER THE TONGUE EVERY 5 MINUTES AS NEEDED FOR CHEST PAIN.  DO NOT EXCEED A TOTAL OF 3 DOSES IN 15 MINUTES NOW 25 tablet 3  . pantoprazole (PROTONIX) 40 MG tablet TAKE ONE TABLET BY MOUTH ONCE DAILY 30 tablet 2  . polyethylene glycol (MIRALAX / GLYCOLAX) packet Take 17 g by mouth daily as needed for mild constipation.    . polyvinyl alcohol (LIQUIFILM TEARS) 1.4 % ophthalmic solution Place 1 drop into both eyes daily as needed. For dry eyes    . potassium chloride SA (K-DUR,KLOR-CON) 20 MEQ tablet Take 1 tablet (20 mEq total) by mouth daily. 60 tablet 5  . RANEXA 500 MG 12 hr tablet TAKE ONE TABLET BY MOUTH TWICE DAILY 60 tablet 2  . timolol (TIMOPTIC) 0.5 % ophthalmic solution Place 1 drop into both eyes every 12 (twelve) hours. 10 mL 12  . furosemide (LASIX) 80 MG tablet Take 1 tablet (80 mg total) by mouth 2 (two) times daily. 60 tablet 5   No facility-administered medications prior to visit.        Past Medical History:  Diagnosis Date  . Anemia   . Blind right eye   . Chronic back pain    "my whole back" (03/18/2015)  . Chronic diastolic heart failure ([place]) 9/13   echo 03/20/15 LV Ef of 60-65%, grade 2DD and PA peak pressure: 36 mm Hg   . Coronary artery disease    Medical Rx  . Depression   . Diabetic retinopathy ([place])   . GERD (gastroesophageal reflux disease)   . Glaucoma   . Headache   . History of blood transfusion "several"   "related to menopause"  . Hyperlipemia   . Hypertension   . Morbid obesity with BMI of 40.0-44.9,  adult ([place])   . Myocardial infarction ([place]) "several"  . Neuropathy    hands    diabetic neuropthy  . Obstructive sleep apnea    "they say I do; I say I don't; no mask" (03/18/2015)  . Renal insufficiency   . Type II diabetes mellitus ([facility])    insulin dependent        Past Surgical History:  Procedure Laterality Date  . CARDIAC CATHETERIZATION  July 2010  . CATARACT EXTRACTION, BILATERAL Bilateral   .  DILATION AND CURETTAGE OF UTERUS    . EYE SURGERY Bilateral "several"  . INCISION AND DRAINAGE PERIRECTAL ABSCESS  04/03/2012   Social History        Socioeconomic History  . Marital status: Married    Spouse name: Not on file  . Number of children: 0  . Years of education: Not on file  . Highest education level: Not on file  Occupational History  . Occupation: disabled  Social Needs  . Financial resource strain: Not on file  . Food insecurity:    Worry: Not on file    Inability: Not on file  . Transportation needs:    Medical: Not on file    Non-medical: Not on file  Tobacco Use  . Smoking status: Never Smoker  . Smokeless tobacco: Never Used  Substance and Sexual Activity  . Alcohol use: No  . Drug use: No  . Sexual activity: Yes  Lifestyle  . Physical activity:    Days per week: Not on file    Minutes per session: Not on file  . Stress: Not on file  Relationships  . Social connections:    Talks on phone: Not on file    Gets together: Not on file    Attends religious service: Not on file    Active member of club or organization: Not on file    Attends meetings of clubs or organizations: Not on file    Relationship status: Not on file  Other Topics Concern  . Not on file  Social History Narrative  . Not on file        Family History  Problem Relation Age of Onset  . Diabetes Mother   . Heart disease Mother   . Heart attack Mother   . Hypertension Mother   . Breast cancer Mother   . Colon polyps Father    . Diabetes Father   . Heart disease Father   . Heart attack Father   . Stroke Father   . Hypertension Father   . Diabetes Brother   . Diabetes Sister   . Heart disease Brother   . Heart disease Sister   . Diabetes Maternal Grandmother   . Hypertension Maternal Grandmother   . Hypertension Brother   . Hypertension Sister   . Breast cancer Cousin   . Colon cancer Cousin       Review of Systems: Pertinent positive and negative review of systems were noted in the above HPI section. All other review of systems were otherwise negative.    Physical Exam: General: Well developed, well nourished, no acute distress Head: Normocephalic and atraumatic Eyes:  sclerae anicteric, EOMI Ears: Normal auditory acuity Mouth: No deformity or lesions Neck: Supple, no masses or thyromegaly Lungs: Clear throughout to auscultation Heart: Regular rate and rhythm; no murmurs, rubs or bruits Abdomen: Soft, non tender and non distended. No masses, hepatosplenomegaly or hernias noted. Normal Bowel sounds Rectal: Deferred to colonoscopy Musculoskeletal: Symmetrical with no gross deformities  Skin: No lesions on visible extremities Pulses:  Normal pulses noted Extremities: No clubbing, cyanosis, edema or deformities noted Neurological: Alert oriented x 4, grossly nonfocal Cervical Nodes:  No significant cervical adenopathy Inguinal Nodes: No significant inguinal adenopathy Psychological:  Alert and cooperative. Normal mood and affect  Assessment and Recommendations:  1. Cologuard positive stool. R/O colorectal neoplasms.  Schedule colonoscopy at hospital due to BMI.  The risks (including bleeding, perforation, infection, missed lesions, medication reactions and possible hospitalization or surgery if complications occur),  benefits, and alternatives to colonoscopy with possible biopsy and possible polypectomy were discussed with the patient and they consent to proceed.   2.   Constipation. DC Peptobismol as it could be exacerbating constipation. Continue prune juice and Ducolax.   3. Gastroparesis.  Unclear how this diagnosis was established.  Symptoms under good control on metoclopramide and pantoprazole.  4. Hold Plavix 5 days before procedure - will instruct when and how to resume after procedure. Low but real risk of cardiovascular event such as heart attack, stroke, embolism, thrombosis or ischemia/infarct of other organs off Plavix explained and need to seek urgent help if this occurs. The patient consents to proceed. Will communicate by phone or EMR with patient's prescribing provider to confirm that holding Plavix is reasonable in this case.   5. BMI=52.  6. CKD stage III.   7. DM.  8. Diastolic heart failure.

## 2018-02-13 NOTE — Anesthesia Procedure Notes (Signed)
Procedure Name: MAC Date/Time: 02/13/2018 10:54 AM Performed by: Dione Booze, CRNA Pre-anesthesia Checklist: Patient identified, Emergency Drugs available, Suction available and Patient being monitored Patient Re-evaluated:Patient Re-evaluated prior to induction Oxygen Delivery Method: Simple face mask Placement Confirmation: positive ETCO2

## 2018-02-13 NOTE — Transfer of Care (Signed)
Immediate Anesthesia Transfer of Care Note  Patient: Stehanie M Gum  Procedure(s) Performed: COLONOSCOPY WITH PROPOFOL (N/A ) POLYPECTOMY  Patient Location: PACU and Endoscopy Unit  Anesthesia Type:MAC  Level of Consciousness: awake and patient cooperative  Airway & Oxygen Therapy: Patient Spontanous Breathing and Patient connected to face mask oxygen  Post-op Assessment: Report given to RN and Post -op Vital signs reviewed and stable  Post vital signs: Reviewed and stable  Last Vitals:  Vitals Value Taken Time  BP    Temp    Pulse    Resp    SpO2      Last Pain:  Vitals:   02/13/18 0834  TempSrc: Oral  PainSc: 0-No pain         Complications: No apparent anesthesia complications

## 2018-02-13 NOTE — Anesthesia Postprocedure Evaluation (Signed)
Anesthesia Post Note  Patient: [name]  Procedure(s) Performed: COLONOSCOPY WITH PROPOFOL (N/A ) POLYPECTOMY     Patient location during evaluation: PACU Anesthesia Type: MAC Level of consciousness: awake and alert Pain management: pain level controlled Vital Signs Assessment: post-procedure vital signs reviewed and stable Respiratory status: spontaneous breathing, nonlabored ventilation, respiratory function stable and patient connected to nasal cannula oxygen Cardiovascular status: stable and blood pressure returned to baseline Postop Assessment: no apparent nausea or vomiting Anesthetic complications: no    Last Vitals:  Vitals:   02/13/18 0834 02/13/18 1127  BP: (!) 227/89 (!) 192/50  Pulse: 79 84  Resp: 11 20  Temp: 37.2 C 36.6 C  SpO2: 99% 100%    Last Pain:  Vitals:   02/13/18 1127  TempSrc: Oral  PainSc: 0-No pain                 Fynley Chrystal S

## 2018-02-13 NOTE — Op Note (Signed)
[facility] Patient Name: [name] Procedure Date: 02/13/2018 MRN: [id number] Attending MD: Ladene Artist , MD Date of Birth: 12-05-1955 CSN: [id number] Age: 62 Admit Type: Outpatient Procedure:                Colonoscopy Indications:              Positive Cologuard test Providers:                Pricilla Riffle. Fuller Plan, MD, Elmer Ramp. Tilden Dome, RN, Charolette Child, Technician, Dione Booze, CRNA Referring MD:             Karle Plumber, MD Medicines:                Monitored Anesthesia Care Complications:            No immediate complications. Estimated blood loss:                            None. Estimated Blood Loss:     Estimated blood loss: none. Procedure:                Pre-Anesthesia Assessment:                           - Prior to the procedure, a History and Physical                            was performed, and patient medications and                            allergies were reviewed. The patient's tolerance of                            previous anesthesia was also reviewed. The risks                            and benefits of the procedure and the sedation                            options and risks were discussed with the patient.                            All questions were answered, and informed consent                            was obtained. Prior Anticoagulants: The patient has                            taken no previous anticoagulant or antiplatelet                            agents. ASA Grade Assessment: III - A patient with  severe systemic disease. After reviewing the risks                            and benefits, the patient was deemed in                            satisfactory condition to undergo the procedure.                           After obtaining informed consent, the colonoscope                            was passed under direct vision. Throughout the   procedure, the patient's blood pressure, pulse, and                            oxygen saturations were monitored continuously. The                            [id number] ([id number]) scope was introduced through                            the anus and advanced to the the cecum, identified                            by appendiceal orifice and ileocecal valve. The                            ileocecal valve, appendiceal orifice, and rectum                            were photographed. The quality of the bowel                            preparation was good. The colonoscopy was performed                            without difficulty. The patient tolerated the                            procedure well. Scope In: 11:02:00 AM Scope Out: 11:19:43 AM Scope Withdrawal Time: 0 hours 13 minutes 22 seconds  Total Procedure Duration: 0 hours 17 minutes 43 seconds  Findings:      The perianal and digital rectal examinations were normal.      A 5 mm polyp was found in the cecum. The polyp was sessile. The polyp       was removed with a cold biopsy forceps. Resection and retrieval were       complete.      Three sessile polyps were found in the descending colon (2) and       ascending colon (1). The polyps were 7 to 8 mm in size. These polyps       were removed with a cold snare. Resection and retrieval were complete.      The exam was otherwise without abnormality on direct  and retroflexion       views. Impression:               - One 5 mm polyp in the cecum, removed with a cold                            biopsy forceps. Resected and retrieved.                           - Three 7 to 8 mm polyps in the descending colon                            and in the ascending colon, removed with a cold                            snare. Resected and retrieved.                           - The examination was otherwise normal on direct                            and retroflexion views. Moderate Sedation:      N/A- Per  Anesthesia Care Recommendation:           - Repeat colonoscopy in 3 - 5 years for                            surveillance pending pathology review.                           - Patient has a contact number available for                            emergencies. The signs and symptoms of potential                            delayed complications were discussed with the                            patient. Return to normal activities tomorrow.                            Written discharge instructions were provided to the                            patient.                           - Resume previous diet.                           - Continue present medications.                           - Await pathology results. Procedure Code(s):        --- Professional ---  45385, Colonoscopy, flexible; with removal of                            tumor(s), polyp(s), or other lesion(s) by snare                            technique                           45380, 59, Colonoscopy, flexible; with biopsy,                            single or multiple Diagnosis Code(s):        --- Professional ---                           D12.0, Benign neoplasm of cecum                           D12.4, Benign neoplasm of descending colon                           D12.2, Benign neoplasm of ascending colon                           R19.5, Other fecal abnormalities CPT copyright 2017 [employer]. All rights reserved. The codes documented in this report are preliminary and upon coder review may  be revised to meet current compliance requirements. Ladene Artist, MD 02/13/2018 11:25:27 AM This report has been signed electronically. Number of Addenda: 0

## 2018-02-13 NOTE — Anesthesia Preprocedure Evaluation (Signed)
Anesthesia Evaluation  Patient identified by MRN, date of birth, ID band Patient awake    Reviewed: Allergy & Precautions, NPO status , Patient's Chart, lab work & pertinent test results  Airway Mallampati: II  TM Distance: >3 FB Neck ROM: Full    Dental no notable dental hx.    Pulmonary sleep apnea ,    Pulmonary exam normal breath sounds clear to auscultation       Cardiovascular hypertension, + CAD and + Past MI  Normal cardiovascular exam Rhythm:Regular Rate:Normal     Neuro/Psych negative neurological ROS  negative psych ROS   GI/Hepatic negative GI ROS, Neg liver ROS,   Endo/Other  diabetesMorbid obesity  Renal/GU negative Renal ROS  negative genitourinary   Musculoskeletal negative musculoskeletal ROS (+)   Abdominal   Peds negative pediatric ROS (+)  Hematology negative hematology ROS (+)   Anesthesia Other Findings   Reproductive/Obstetrics negative OB ROS                             Anesthesia Physical Anesthesia Plan  ASA: II  Anesthesia Plan: MAC   Post-op Pain Management:    Induction: Intravenous  PONV Risk Score and Plan: 0  Airway Management Planned: Simple Face Mask  Additional Equipment:   Intra-op Plan:   Post-operative Plan:   Informed Consent: I have reviewed the patients History and Physical, chart, labs and discussed the procedure including the risks, benefits and alternatives for the proposed anesthesia with the patient or authorized representative who has indicated his/her understanding and acceptance.   Dental advisory given  Plan Discussed with: CRNA and Surgeon  Anesthesia Plan Comments:         Anesthesia Quick Evaluation

## 2018-02-14 ENCOUNTER — Ambulatory Visit: Payer: Medicare Other | Admitting: Internal Medicine

## 2018-02-14 ENCOUNTER — Encounter (HOSPITAL_COMMUNITY): Payer: Self-pay | Admitting: Gastroenterology

## 2018-02-14 ENCOUNTER — Encounter: Payer: Self-pay | Admitting: Gastroenterology

## 2018-02-21 ENCOUNTER — Ambulatory Visit: Payer: PRIVATE HEALTH INSURANCE | Admitting: Internal Medicine

## 2018-02-21 ENCOUNTER — Other Ambulatory Visit: Payer: Self-pay | Admitting: Cardiovascular Disease

## 2018-02-22 DIAGNOSIS — N183 Chronic kidney disease, stage 3 (moderate): Secondary | ICD-10-CM | POA: Diagnosis not present

## 2018-03-01 ENCOUNTER — Other Ambulatory Visit: Payer: Self-pay | Admitting: Cardiovascular Disease

## 2018-03-02 DIAGNOSIS — N183 Chronic kidney disease, stage 3 (moderate): Secondary | ICD-10-CM | POA: Diagnosis not present

## 2018-03-02 DIAGNOSIS — I129 Hypertensive chronic kidney disease with stage 1 through stage 4 chronic kidney disease, or unspecified chronic kidney disease: Secondary | ICD-10-CM | POA: Diagnosis not present

## 2018-03-02 DIAGNOSIS — E1122 Type 2 diabetes mellitus with diabetic chronic kidney disease: Secondary | ICD-10-CM | POA: Diagnosis not present

## 2018-03-02 DIAGNOSIS — D631 Anemia in chronic kidney disease: Secondary | ICD-10-CM | POA: Diagnosis not present

## 2018-03-21 ENCOUNTER — Ambulatory Visit: Payer: Medicare Other | Admitting: Internal Medicine

## 2018-04-13 ENCOUNTER — Ambulatory Visit: Payer: Medicare Other | Attending: Internal Medicine | Admitting: Internal Medicine

## 2018-04-13 ENCOUNTER — Encounter: Payer: Self-pay | Admitting: Internal Medicine

## 2018-04-13 VITALS — BP 151/93 | HR 64 | Temp 97.8°F | Ht 63.0 in | Wt 286.0 lb

## 2018-04-13 DIAGNOSIS — E785 Hyperlipidemia, unspecified: Secondary | ICD-10-CM | POA: Diagnosis not present

## 2018-04-13 DIAGNOSIS — E118 Type 2 diabetes mellitus with unspecified complications: Secondary | ICD-10-CM | POA: Diagnosis not present

## 2018-04-13 DIAGNOSIS — E1122 Type 2 diabetes mellitus with diabetic chronic kidney disease: Secondary | ICD-10-CM | POA: Insufficient documentation

## 2018-04-13 DIAGNOSIS — I25119 Atherosclerotic heart disease of native coronary artery with unspecified angina pectoris: Secondary | ICD-10-CM | POA: Insufficient documentation

## 2018-04-13 DIAGNOSIS — Z6841 Body Mass Index (BMI) 40.0 and over, adult: Secondary | ICD-10-CM | POA: Insufficient documentation

## 2018-04-13 DIAGNOSIS — Z823 Family history of stroke: Secondary | ICD-10-CM | POA: Insufficient documentation

## 2018-04-13 DIAGNOSIS — D124 Benign neoplasm of descending colon: Secondary | ICD-10-CM | POA: Insufficient documentation

## 2018-04-13 DIAGNOSIS — Z8371 Family history of colonic polyps: Secondary | ICD-10-CM | POA: Insufficient documentation

## 2018-04-13 DIAGNOSIS — D120 Benign neoplasm of cecum: Secondary | ICD-10-CM | POA: Insufficient documentation

## 2018-04-13 DIAGNOSIS — I10 Essential (primary) hypertension: Secondary | ICD-10-CM

## 2018-04-13 DIAGNOSIS — I5032 Chronic diastolic (congestive) heart failure: Secondary | ICD-10-CM | POA: Diagnosis not present

## 2018-04-13 DIAGNOSIS — Z803 Family history of malignant neoplasm of breast: Secondary | ICD-10-CM | POA: Diagnosis not present

## 2018-04-13 DIAGNOSIS — R21 Rash and other nonspecific skin eruption: Secondary | ICD-10-CM | POA: Diagnosis not present

## 2018-04-13 DIAGNOSIS — Z9889 Other specified postprocedural states: Secondary | ICD-10-CM | POA: Insufficient documentation

## 2018-04-13 DIAGNOSIS — Z833 Family history of diabetes mellitus: Secondary | ICD-10-CM | POA: Insufficient documentation

## 2018-04-13 DIAGNOSIS — M7989 Other specified soft tissue disorders: Secondary | ICD-10-CM | POA: Diagnosis not present

## 2018-04-13 DIAGNOSIS — Z794 Long term (current) use of insulin: Secondary | ICD-10-CM

## 2018-04-13 DIAGNOSIS — N183 Chronic kidney disease, stage 3 (moderate): Secondary | ICD-10-CM | POA: Insufficient documentation

## 2018-04-13 DIAGNOSIS — Z888 Allergy status to other drugs, medicaments and biological substances status: Secondary | ICD-10-CM | POA: Diagnosis not present

## 2018-04-13 DIAGNOSIS — I130 Hypertensive heart and chronic kidney disease with heart failure and stage 1 through stage 4 chronic kidney disease, or unspecified chronic kidney disease: Secondary | ICD-10-CM | POA: Insufficient documentation

## 2018-04-13 DIAGNOSIS — Z8249 Family history of ischemic heart disease and other diseases of the circulatory system: Secondary | ICD-10-CM | POA: Insufficient documentation

## 2018-04-13 DIAGNOSIS — D126 Benign neoplasm of colon, unspecified: Secondary | ICD-10-CM | POA: Insufficient documentation

## 2018-04-13 DIAGNOSIS — D122 Benign neoplasm of ascending colon: Secondary | ICD-10-CM | POA: Insufficient documentation

## 2018-04-13 DIAGNOSIS — Z91018 Allergy to other foods: Secondary | ICD-10-CM | POA: Insufficient documentation

## 2018-04-13 DIAGNOSIS — Z7982 Long term (current) use of aspirin: Secondary | ICD-10-CM | POA: Insufficient documentation

## 2018-04-13 DIAGNOSIS — Z79899 Other long term (current) drug therapy: Secondary | ICD-10-CM | POA: Diagnosis not present

## 2018-04-13 DIAGNOSIS — Z800 Family history of malignant neoplasm of digestive organs: Secondary | ICD-10-CM | POA: Diagnosis not present

## 2018-04-13 DIAGNOSIS — K219 Gastro-esophageal reflux disease without esophagitis: Secondary | ICD-10-CM | POA: Diagnosis not present

## 2018-04-13 DIAGNOSIS — G5603 Carpal tunnel syndrome, bilateral upper limbs: Secondary | ICD-10-CM | POA: Diagnosis not present

## 2018-04-13 DIAGNOSIS — Z23 Encounter for immunization: Secondary | ICD-10-CM

## 2018-04-13 DIAGNOSIS — E669 Obesity, unspecified: Secondary | ICD-10-CM | POA: Insufficient documentation

## 2018-04-13 LAB — POCT GLYCOSYLATED HEMOGLOBIN (HGB A1C): Hemoglobin A1C: 6.6 % — AB (ref 4.0–5.6)

## 2018-04-13 LAB — GLUCOSE, POCT (MANUAL RESULT ENTRY): POC Glucose: 160 mg/dl — AB (ref 70–99)

## 2018-04-13 NOTE — Progress Notes (Signed)
Patient ID: CRICKET GOODLIN, female    DOB: May 19, 1956  MRN: [id number]  CC: Follow-up   Subjective: [name] is a 62 y.o. female who presents for chronic disease management.  Patient last seen in April. Her concerns today include:  Pt with hx of DM with probable gastroparesis, retinopathy, HTN, HL, CAD, chronic diastolic CHF, obesity,IDA, OA knees, Vit B 12 def  Colon cancer screening: Since last visit she had colonoscopy and 4 polyps (largest 7-8 mm) were removed.  All came back as tubular adenomas.  Patient will need repeat study in 3 years.  DM:  Check BS occasional.  Occasional hypoglycemia. Compliant with insulin. Doing okay with eating habits.  Eat a lot of sandwiches.  Drinks tea with Splenda sweetener Numbness in hands in mornings x 2 mths. Sometimes she wakes up at nights with numbness in the hands. Last 10-15 mins.  No numbness in feet.  HTN/CAD/HL: reports compliance with meds but did not take BP meds as yet for today. No increase swelling in legs.  No scale at home Limits salt in foods No CP/SOB/HA/dizziness Cardiologist d/c Ranexa  Had biopsy RT breast 11/2017.  Path revealed fibrocystic changes with Ca+  Complains of intermittent itching rash on face x 1 mth.  Does not use soap or creams on face.  No new hair products  Patient Active Problem List   Diagnosis Date Noted  . Benign neoplasm of descending colon   . Benign neoplasm of ascending colon   . Benign neoplasm of cecum   . Fibrocystic breast changes, right 12/12/2017  . Drug-induced constipation 09/15/2017  . Primary osteoarthritis of both knees 04/05/2017  . Chronic bilateral low back pain 03/07/2017  . Chronic pain of right knee 03/07/2017  . Positive colorectal cancer screening using Cologuard test 03/07/2017  . Vitamin B12 deficiency 12/09/2016  . Bilateral hand numbness 11/26/2016  . Incidental lung nodule, > 51m and < 880m02/13/2018  . Unstable angina pectoris ([facility]  . Vitreous  hemorrhage of right eye (HC[facility]  . Angina pectoris (HC[facility] [place]07/04/2016  . Diabetic gastroparesis associated with type 2 diabetes mellitus (HCL[place]02/05/2016  . Controlled type 2 diabetes mellitus with complication, with long-term current use of insulin (HC[facility]02/05/2016  . Chronic renal insufficiency, stage III (moderate) (HCC[place]08/17/2016  . Unstable angina (HCL[place]08/16/2016  . Prolonged Q-T interval on ECG 08/10/2013  . Dyslipidemia 04/02/2013  . Chronic diastolic heart failure, NYHA class 3 (HCRoy09/01/2012  . Presumed OSA (obstructive sleep apnea) 04/06/2012  . NSTEMI  post-op 04/04/12-medical Rx 04/04/2012  . Super obesity 01/17/2012  . GERD 08/21/2009  . DIZZINESS 08/21/2009  . Post-menopausal bleeding 03/20/2009  . PERIPHERAL EDEMA 03/20/2009  . Coronary atherosclerosis-not a surgical or PCI candidate 2010 02/27/2009  . SHOULDER PAIN, LEFT 10/20/2007  . Proliferative diabetic retinopathy (HCA[facility]03/01/2008  . DETACHED RETINA, BILATERAL, HX OF 09/07/2007  . History of chronic Iron defeicency anemia with acute post op anemia, transfused after debridment 04/04/12 04/17/2007  . Essential hypertension 04/17/2007     Current Outpatient Medications on File Prior to Visit  Medication Sig Dispense Refill  . ACCU-CHEK SOFTCLIX LANCETS lancets Use as instructed for 3 times daily blood glucose monitoring 100 each 5  . acetaminophen (TYLENOL) 500 MG tablet Take 1 tablet (500 mg total) by mouth every 6 (six) hours as needed. 30 tablet 0  . acetaZOLAMIDE (DIAMOX) 500 MG capsule Take 500 mg by mouth 2 (two) times daily.    . Marland Kitchenspirin 81  MG tablet Take 81 mg by mouth daily.    Marland Kitchen atorvastatin (LIPITOR) 80 MG tablet TAKE ONE TABLET BY MOUTH ONCE DAILY IN THE EVENING AT  6PM 90 tablet 3  . baclofen (LIORESAL) 10 MG tablet Take 1 tablet (10 mg total) by mouth at bedtime as needed for muscle spasms. 30 each 5  . Blood Glucose Monitoring Suppl (ACCU-CHEK AVIVA PLUS) w/Device KIT Used as directed 1 kit 0  . Blood  Pressure Monitoring (BLOOD PRESSURE MONITOR/ARM) DEVI 1 each by Does not apply route daily. ICD 10, I10 and I50.32 1 Device 0  . brimonidine (ALPHAGAN) 0.2 % ophthalmic solution Place 1 drop into both eyes every 12 (twelve) hours. (Patient taking differently: Place 1 drop into both eyes daily as needed (pain). ) 5 mL 12  . brimonidine-timolol (COMBIGAN) 0.2-0.5 % ophthalmic solution Place 1 drop into both eyes 2 (two) times daily.     . calcium carbonate (TUMS - DOSED IN MG ELEMENTAL CALCIUM) 500 MG chewable tablet Chew 1 tablet by mouth daily as needed for indigestion.     . Cyanocobalamin (VITAMIN B-12) 1000 MCG TABS Take 1,000 mcg by mouth daily. 90 tablet 1  . diltiazem (DILACOR XR) 240 MG 24 hr capsule Take 1 capsule (240 mg total) by mouth daily. 90 capsule 3  . docusate sodium (COLACE) 100 MG capsule Take 1 capsule (100 mg total) by mouth 2 (two) times daily. 60 capsule 3  . ferrous sulfate 325 (65 FE) MG tablet Take 325 mg by mouth daily with breakfast.    . glucose blood test strip Use TID before meals / Dx E11.9 100 each 12  . glucose monitoring kit (FREESTYLE) monitoring kit 1 each by Does not apply route 4 (four) times daily - after meals and at bedtime. 1 each 1  . insulin lispro protamine-lispro (HUMALOG MIX 75/25) (75-25) 100 UNIT/ML SUSP injection INJECT 50 UNITS INTO THE SKIN 2 TIMES DAILY WITH A MEAL 40 mL 11  . Insulin Syringe-Needle U-100 (RELION INSULIN SYRINGE) 31G X 15/64" 1 ML MISC Use as directed for twice daily insulin adminstration. E11.9 100 each 5  . isosorbide mononitrate (IMDUR) 120 MG 24 hr tablet TAKE 1 TABLET BY MOUTH ONCE DAILY 90 tablet 3  . losartan (COZAAR) 50 MG tablet Take 1 tablet (50 mg total) by mouth daily. 30 tablet 5  . metoCLOPramide (REGLAN) 10 MG tablet Take 1 tablet (10 mg total) by mouth as directed for 2 doses. 2 tablet 0  . metoCLOPramide (REGLAN) 5 MG tablet 1 tab twice a day before the two larges meals of the day 60 tablet 5  . metoprolol  (TOPROL-XL) 200 MG 24 hr tablet TAKE 1 TABLET BY MOUTH ONCE DAILY 90 tablet 2  . nitroGLYCERIN (NITROSTAT) 0.4 MG SL tablet DISSOLVE ONE TABLET UNDER THE TONGUE EVERY 5 MINUTES AS NEEDED FOR CHEST PAIN.  DO NOT EXCEED A TOTAL OF 3 DOSES IN 15 MINUTES NOW 25 tablet 3  . pantoprazole (PROTONIX) 40 MG tablet TAKE ONE TABLET BY MOUTH ONCE DAILY 30 tablet 2  . potassium chloride SA (K-DUR,KLOR-CON) 20 MEQ tablet Take 1 tablet (20 mEq total) by mouth 2 (two) times daily. 180 tablet 3  . furosemide (LASIX) 40 MG tablet Take 1 tablet (40 mg total) by mouth 2 (two) times daily. (Patient taking differently: Take 120 mg by mouth 2 (two) times daily. ) 90 tablet 3  . LUMIGAN 0.01 % SOLN Place 1 drop into the left eye at bedtime.     Marland Kitchen  Menthol-Methyl Salicylate (MUSCLE RUB EX) Apply 1 application topically daily as needed (knee pain).     Marland Kitchen metolazone (ZAROXOLYN) 2.5 MG tablet 1 tab once every two weeks PRN swelling lower extremities (Patient not taking: Reported on 02/07/2018) 10 tablet 3  . ranolazine (RANEXA) 500 MG 12 hr tablet TAKE 1 TABLET BY MOUTH TWICE DAILY (Patient not taking: Reported on 02/07/2018) 60 tablet 0  . timolol (TIMOPTIC) 0.5 % ophthalmic solution Place 1 drop into both eyes every 12 (twelve) hours. (Patient not taking: Reported on 02/07/2018) 10 mL 12   No current facility-administered medications on file prior to visit.     Allergies  Allergen Reactions  . Ativan [Lorazepam] Anaphylaxis  . Orange Fruit [Citrus] Anaphylaxis    Pt stated throat swelling, itching    Social History   Socioeconomic History  . Marital status: Married    Spouse name: Not on file  . Number of children: 0  . Years of education: Not on file  . Highest education level: Not on file  Occupational History  . Occupation: disabled  Social Needs  . Financial resource strain: Not on file  . Food insecurity:    Worry: Not on file    Inability: Not on file  . Transportation needs:    Medical: Not on file     Non-medical: Not on file  Tobacco Use  . Smoking status: Never Smoker  . Smokeless tobacco: Never Used  Substance and Sexual Activity  . Alcohol use: No  . Drug use: No  . Sexual activity: Yes  Lifestyle  . Physical activity:    Days per week: Not on file    Minutes per session: Not on file  . Stress: Not on file  Relationships  . Social connections:    Talks on phone: Not on file    Gets together: Not on file    Attends religious service: Not on file    Active member of club or organization: Not on file    Attends meetings of clubs or organizations: Not on file    Relationship status: Not on file  . Intimate partner violence:    Fear of current or ex partner: Not on file    Emotionally abused: Not on file    Physically abused: Not on file    Forced sexual activity: Not on file  Other Topics Concern  . Not on file  Social History Narrative  . Not on file    Family History  Problem Relation Age of Onset  . Diabetes Mother   . Heart disease Mother   . Heart attack Mother   . Hypertension Mother   . Breast cancer Mother   . Colon polyps Father   . Diabetes Father   . Heart disease Father   . Heart attack Father   . Stroke Father   . Hypertension Father   . Diabetes Brother   . Diabetes Sister   . Heart disease Brother   . Heart disease Sister   . Diabetes Maternal Grandmother   . Hypertension Maternal Grandmother   . Hypertension Brother   . Hypertension Sister   . Breast cancer Cousin   . Colon cancer Cousin   . Esophageal cancer Neg Hx   . Rectal cancer Neg Hx   . Stomach cancer Neg Hx     Past Surgical History:  Procedure Laterality Date  . BIOPSY  02/13/2018   Procedure: BIOPSY;  Surgeon: Ladene Artist, MD;  Location: [facility];  Service: Endoscopy;;  . BREAST SURGERY     right breast biopsy  . CARDIAC CATHETERIZATION  July 2010  . CATARACT EXTRACTION, BILATERAL Bilateral   . COLONOSCOPY WITH PROPOFOL N/A 02/13/2018   Procedure: COLONOSCOPY  WITH PROPOFOL;  Surgeon: Ladene Artist, MD;  Location: [facility];  Service: Endoscopy;  Laterality: N/A;  . DILATION AND CURETTAGE OF UTERUS    . EYE SURGERY Bilateral "several"  . INCISION AND DRAINAGE PERIRECTAL ABSCESS  04/03/2012  . IR US GUIDE VASC ACCESS LEFT  02/13/2018  . IR VENIPUNCTURE 106YRS/OLDER BY MD  02/13/2018  . POLYPECTOMY  02/13/2018   Procedure: POLYPECTOMY;  Surgeon: Ladene Artist, MD;  Location: [facility];  Service: Endoscopy;;    ROS: Review of Systems Negative except as above. PHYSICAL EXAM: BP (!) 151/93 (BP Location: Left Arm, Patient Position: Sitting, Cuff Size: Large)   Pulse 64   Temp 97.8 F (36.6 C) (Oral)   Ht _0  (1.6 m)   Wt 286 lb (129.7 kg)   LMP 04/02/2012   SpO2 99%   BMI 50.66 kg/m   Wt Readings from Last 3 Encounters:  04/13/18 286 lb (129.7 kg)  02/13/18 289 lb (131.1 kg)  01/23/18 289 lb 3.2 oz (131.2 kg)   Physical Exam  General appearance - alert, well appearing, and in no distress Mental status - normal mood, behavior, speech, dress, motor activity, and thought processes Neck - supple, no significant adenopathy Chest - clear to auscultation, no wheezes, rales or rhonchi, symmetric air entry Heart - normal rate, regular rhythm, normal S1, S2, no murmurs, rubs, clicks or gallops Musculoskeletal -patient ambulates with a cane.  She was unable to get up on the exam table so we had to examine her in the chair. Extremities -trace lower extremity edema Neurologic: Grip 5/5 bilaterally.  Tinel sign positive bilaterally.  No wasting of intrinsic hand muscles. Diabetic Foot Exam - Simple   Simple Foot Form Visual Inspection No deformities, no ulcerations, no other skin breakdown bilaterally:  Yes Sensation Testing Intact to touch and monofilament testing bilaterally:  Yes Pulse Check Posterior Tibialis and Dorsalis pulse intact bilaterally:  Yes Comments       Chemistry      Component Value Date/Time   NA 140 12/20/2017  0949   K 3.5 12/20/2017 0949   CL 104 12/20/2017 0949   CO2 22 12/20/2017 0949   BUN 14 12/20/2017 0949   CREATININE 1.20 (H) 12/20/2017 0949   CREATININE 1.30 (H) 09/28/2016 1004      Component Value Date/Time   CALCIUM 9.2 12/20/2017 0949   ALKPHOS 60 12/20/2017 0949   AST 11 12/20/2017 0949   ALT 8 12/20/2017 0949   BILITOT 0.4 12/20/2017 0949     Lab Results  Component Value Date   WBC 12.2 (H) 12/20/2017   HGB 12.8 12/20/2017   HCT 39.5 12/20/2017   MCV 98 (H) 12/20/2017   PLT 291 12/20/2017   Results for orders placed or performed in visit on 04/13/18  POCT glucose (manual entry)  Result Value Ref Range   POC Glucose 160 (A) 70 - 99 mg/dl  HgB A1c  Result Value Ref Range   Hemoglobin A1C 6.6 (A) 4.0 - 5.6 %   HbA1c POC (<> result, manual entry)     HbA1c, POC (prediabetic range)     HbA1c, POC (controlled diabetic range)     ASSESSMENT AND PLAN: 1. Controlled type 2 diabetes mellitus with complication, with long-term current use of  insulin (HCC) Continue current dose of insulin. Continue to encourage healthy eating habits.  Encouraged her to move more as tolerated. - POCT glucose (manual entry) - HgB V6H - Basic Metabolic Panel  2. Essential hypertension Not at goal but patient has not taken medicines as yet for today.  No changes made in medicines.  Continue DASH diet.  3. Chronic diastolic congestive heart failure ([place]) Compensated.  Stable on current medications including Lasix 120 mg twice a day.  4. Bilateral carpal tunnel syndrome Discussed diagnosis with patient.  Prescription given for cock-up wrist splints.  5. Adenomatous polyp of colon, unspecified part of colon   6. Rash of face Currently does not have rash on the face.  I recommend that she can use some over-the-counter hydrocortisone cream as needed whenever she does get flareup of the rash.  7. Need for influenza vaccination Patient states that she will get the flu vaccine at [facility].   She could not wait today as her ride with the [employer] bus came for her.  Patient was given the opportunity to ask questions.  Patient verbalized understanding of the plan and was able to repeat key elements of the plan.   Orders Placed This Encounter  Procedures  . Basic Metabolic Panel  . POCT glucose (manual entry)  . HgB A1c     Requested Prescriptions    No prescriptions requested or ordered in this encounter    Return in about 3 months (around 07/13/2018).  Karle Plumber, MD, FACP

## 2018-04-13 NOTE — Patient Instructions (Signed)
Wear the wrist splints a nights to help prevent compression of the nerve in the wrist.    Carpal Tunnel Syndrome Carpal tunnel syndrome is a condition that causes pain in your hand and arm. The carpal tunnel is a narrow area that is on the palm side of your wrist. Repeated wrist motion or certain diseases may cause swelling in the tunnel. This swelling can pinch the main nerve in the wrist (median nerve). Follow these instructions at home: If you have a splint:  Wear it as told by your doctor. Remove it only as told by your doctor.  Loosen the splint if your fingers: ? Become numb and tingle. ? Turn blue and cold.  Keep the splint clean and dry. General instructions  Take over-the-counter and prescription medicines only as told by your doctor.  Rest your wrist from any activity that may be causing your pain. If needed, talk to your employer about changes that can be made in your work, such as getting a wrist pad to use while typing.  If directed, apply ice to the painful area: ? Put ice in a plastic bag. ? Place a towel between your skin and the bag. ? Leave the ice on for 20 minutes, 2-3 times per day.  Keep all follow-up visits as told by your doctor. This is important.  Do any exercises as told by your doctor, physical therapist, or occupational therapist. Contact a doctor if:  You have new symptoms.  Medicine does not help your pain.  Your symptoms get worse. This information is not intended to replace advice given to you by your health care provider. Make sure you discuss any questions you have with your health care provider. Document Released: 07/08/2011 Document Revised: 12/25/2015 Document Reviewed: 12/04/2014 Elsevier Interactive Patient Education  [employer].

## 2018-04-19 ENCOUNTER — Other Ambulatory Visit: Payer: Self-pay | Admitting: Internal Medicine

## 2018-04-19 DIAGNOSIS — I2510 Atherosclerotic heart disease of native coronary artery without angina pectoris: Secondary | ICD-10-CM

## 2018-06-05 ENCOUNTER — Other Ambulatory Visit: Payer: Self-pay | Admitting: Internal Medicine

## 2018-06-05 DIAGNOSIS — I5032 Chronic diastolic (congestive) heart failure: Secondary | ICD-10-CM

## 2018-06-05 DIAGNOSIS — I2510 Atherosclerotic heart disease of native coronary artery without angina pectoris: Secondary | ICD-10-CM

## 2018-06-06 ENCOUNTER — Other Ambulatory Visit: Payer: Self-pay | Admitting: Internal Medicine

## 2018-06-06 NOTE — Telephone Encounter (Signed)
Patient called to get a refill for atorvastatin furosemide Isosorbide  [phone]

## 2018-06-07 ENCOUNTER — Encounter: Payer: Self-pay | Admitting: Internal Medicine

## 2018-06-07 NOTE — Progress Notes (Signed)
Received note from [facility] Dr. Joelyn Oms.  Pt seen 03/02/2018.  Pt with hx of CKD 3 without proteinuria and anemia of chronic renal ds.  Creat 1.36, eGFR 48, Hb 12.9.

## 2018-06-09 NOTE — Telephone Encounter (Signed)
Patient [facility]ills [facility] for [facility]atin and isosorbide. We can see about sending RX for furosemide to them.

## 2018-06-10 MED ORDER — FUROSEMIDE 40 MG PO TABS
40.0000 mg | ORAL_TABLET | Freq: Two times a day (BID) | ORAL | 3 refills | Status: DC
Start: 2018-06-10 — End: 2019-02-21

## 2018-06-14 ENCOUNTER — Other Ambulatory Visit: Payer: Self-pay | Admitting: Internal Medicine

## 2018-06-14 DIAGNOSIS — I2510 Atherosclerotic heart disease of native coronary artery without angina pectoris: Secondary | ICD-10-CM

## 2018-06-14 DIAGNOSIS — I5032 Chronic diastolic (congestive) heart failure: Secondary | ICD-10-CM

## 2018-06-21 ENCOUNTER — Other Ambulatory Visit: Payer: Self-pay | Admitting: Internal Medicine

## 2018-06-21 DIAGNOSIS — I5032 Chronic diastolic (congestive) heart failure: Secondary | ICD-10-CM

## 2018-06-21 DIAGNOSIS — I2510 Atherosclerotic heart disease of native coronary artery without angina pectoris: Secondary | ICD-10-CM

## 2018-06-23 ENCOUNTER — Telehealth: Payer: Self-pay | Admitting: Internal Medicine

## 2018-06-23 NOTE — Telephone Encounter (Signed)
Pt called to request medication advice on -furosemide (LASIX) 40 MG tablet  She needs more information on this medication, please follow up

## 2018-06-26 MED ORDER — FUROSEMIDE 80 MG PO TABS: ORAL_TABLET | ORAL | 1 refills | Status: DC

## 2018-06-26 NOTE — Telephone Encounter (Signed)
[name] could you contact pt

## 2018-06-26 NOTE — Telephone Encounter (Signed)
Pt needs RX for Furosemide 80 to take in conjunction with furosemide 40mg .

## 2018-07-10 ENCOUNTER — Other Ambulatory Visit (HOSPITAL_COMMUNITY)
Admission: RE | Admit: 2018-07-10 | Discharge: 2018-07-10 | Disposition: A | Discharge status: Home / Self Care | Payer: Medicare Other | Source: Ambulatory Visit | Attending: Family Medicine | Admitting: Family Medicine

## 2018-07-10 ENCOUNTER — Encounter: Payer: Self-pay | Admitting: Family Medicine

## 2018-07-10 ENCOUNTER — Ambulatory Visit (INDEPENDENT_AMBULATORY_CARE_PROVIDER_SITE_OTHER): Payer: Medicare Other | Admitting: Family Medicine

## 2018-07-10 ENCOUNTER — Telehealth: Payer: Self-pay | Admitting: Internal Medicine

## 2018-07-10 VITALS — BP 168/83 | HR 67 | Wt 282.6 lb

## 2018-07-10 DIAGNOSIS — E118 Type 2 diabetes mellitus with unspecified complications: Secondary | ICD-10-CM | POA: Diagnosis not present

## 2018-07-10 DIAGNOSIS — N950 Postmenopausal bleeding: Secondary | ICD-10-CM

## 2018-07-10 DIAGNOSIS — Z01419 Encounter for gynecological examination (general) (routine) without abnormal findings: Secondary | ICD-10-CM

## 2018-07-10 DIAGNOSIS — E113523 Type 2 diabetes mellitus with proliferative diabetic retinopathy with traction retinal detachment involving the macula, bilateral: Secondary | ICD-10-CM | POA: Diagnosis not present

## 2018-07-10 DIAGNOSIS — I10 Essential (primary) hypertension: Secondary | ICD-10-CM | POA: Diagnosis not present

## 2018-07-10 DIAGNOSIS — N183 Chronic kidney disease, stage 3 unspecified: Secondary | ICD-10-CM

## 2018-07-10 DIAGNOSIS — Z794 Long term (current) use of insulin: Secondary | ICD-10-CM

## 2018-07-10 NOTE — Progress Notes (Signed)
[name] scheduled for December 18th @ 0800.  Pt notified.

## 2018-07-10 NOTE — Telephone Encounter (Signed)
Dr. Wynetta Emery I have put a handicap application in your fax folder.   Please address the wheelchair

## 2018-07-10 NOTE — Progress Notes (Signed)
   Subjective:    Patient ID: [name] is a 62 y.o. female presenting with Vaginal Bleeding  on 07/10/2018  HPI: Reports 2 months of bleeding. Menopause in 2013. Now bleeding constantly. Changing 10 pads/day. W/u in 2017 showed normal lower segment polyp and negative EMB--with no endometrial tissue. U/s from 5/17 showed multiple fibroids, 10 cm uterus, 2.0 thickness endometrium with suspected polyp measuring up to 3.9 cm.  Review of Systems  Constitutional: Negative for chills and fever.  Respiratory: Negative for shortness of breath.   Cardiovascular: Negative for chest pain.  Gastrointestinal: Negative for abdominal pain, nausea and vomiting.  Genitourinary: Negative for dysuria.  Skin: Negative for rash.      Objective:    BP (!) 168/83   Pulse 67   Wt 282 lb 9.6 oz (128.2 kg)   LMP 04/02/2012   BMI 50.06 kg/m  Physical Exam  Constitutional: She is oriented to person, place, and time. She appears well-developed and well-nourished. No distress.  HENT:  Head: Normocephalic and atraumatic.  Eyes: No scleral icterus.  Neck: Neck supple.  Cardiovascular: Normal rate.  Pulmonary/Chest: Effort normal.  Abdominal: Soft.  Neurological: She is alert and oriented to person, place, and time.  Skin: Skin is warm and dry.  Psychiatric: She has a normal mood and affect.   Procedure: Patient given informed consent, signed copy in the chart, time out was performed. Appropriate time out taken. . The patient was placed in the lithotomy position and the cervix brought into view with sterile speculum.  Portio of cervix cleansed x 2 with betadine swabs.  A tenaculum was placed in the anterior lip of the cervix.  The uterus was sounded for depth of 6 cm. A pipelle was introduced to into the uterus, suction created, and an endometrial sample was obtained. It is not clear that the uterine cavity was entered. Os finder would pass, but pipelle against fibroid or something and would not  advance.  All equipment was removed and accounted for.  The patient tolerated the procedure well.       Assessment & Plan:   Problem List Items Addressed This Visit      Unprioritized   Proliferative diabetic retinopathy ([place]) (Chronic)   Essential hypertension (Chronic)    Bp is too high--see PCP for adjustment      Chronic renal insufficiency, stage III (moderate) (HCC) (Chronic)   Controlled type 2 diabetes mellitus with complication, with long-term current use of insulin (HCC) (Chronic)   Post-menopausal bleeding - Primary    Will book for outpatient D and C with hysteroscopy, possible IUD placement and endometrial sampling. Cannot guarantee good sampling in office. If endometrial carcinoma, would consider IUD placement due to poor surgical candidate.      Relevant Orders   US PELVIC COMPLETE WITH TRANSVAGINAL   Surgical pathology( [place]/ [place])    Other Visit Diagnoses    Well woman exam with routine gynecological exam       Relevant Orders   Cytology - PAP( [place])      Total face-to-face time with patient: 15 minutes. Over 50% of encounter was spent on counseling and coordination of care. Return in about 3 months (around 10/09/2018) for postop check.  [name] 07/10/2018 3:26 PM

## 2018-07-10 NOTE — Telephone Encounter (Signed)
Patient called because she says she needs a wheelchair and was not sure what she needs to do in order to get that prescription/order put in. She also mentioned that she has recently purchased a new car and will be needing a disability placard. She was notified to bring paperwork and that paperwork takes between 7-10 days to complete. Please follow up.

## 2018-07-10 NOTE — Telephone Encounter (Signed)
chabely please schedule pt an appoitnemt

## 2018-07-10 NOTE — Patient Instructions (Signed)
Postmenopausal Bleeding Postmenopausal bleeding is any bleeding after menopause. Menopause is when a woman's period stops. Any type of bleeding after menopause is concerning. It should be checked by your doctor. Any treatment will depend on the cause. Follow these instructions at home: Watch your condition for any changes.  Avoid the use of tampons and douches as told by your doctor.  Change your pads often.  Get regular pelvic exams and Pap tests.  Keep all appointments for tests as told by your doctor.  Contact a doctor if:  Your bleeding lasts for more than 1 week.  You have belly (abdominal) pain.  You have bleeding after sex (intercourse). Get help right away if:  You have a fever, chills, a headache, dizziness, muscle aches, and bleeding.  You have strong pain with bleeding.  You have clumps of blood (blood clots) coming from your vagina.  You have bleeding and need more than 1 pad an hour.  You feel like you are going to pass out (faint). This information is not intended to replace advice given to you by your health care provider. Make sure you discuss any questions you have with your health care provider. Document Released: 04/27/2008 Document Revised: 12/25/2015 Document Reviewed: 02/15/2013 Elsevier Interactive Patient Education  2017 Elsevier Inc.  

## 2018-07-11 ENCOUNTER — Encounter: Payer: Self-pay | Admitting: Family Medicine

## 2018-07-11 NOTE — Assessment & Plan Note (Signed)
Bp is too high--see PCP for adjustment

## 2018-07-11 NOTE — Assessment & Plan Note (Signed)
Will book for outpatient D and C with hysteroscopy, possible IUD placement and endometrial sampling. Cannot guarantee good sampling in office. If endometrial carcinoma, would consider IUD placement due to poor surgical candidate.

## 2018-07-12 ENCOUNTER — Encounter (HOSPITAL_COMMUNITY): Payer: Self-pay

## 2018-07-13 LAB — CYTOLOGY - PAP
Diagnosis: NEGATIVE
HPV (WINDOPATH): NOT DETECTED

## 2018-07-19 ENCOUNTER — Ambulatory Visit (HOSPITAL_COMMUNITY): Payer: Medicare Other | Attending: Family Medicine

## 2018-08-03 DIAGNOSIS — H35372 Puckering of macula, left eye: Secondary | ICD-10-CM | POA: Diagnosis not present

## 2018-08-03 DIAGNOSIS — H401133 Primary open-angle glaucoma, bilateral, severe stage: Secondary | ICD-10-CM | POA: Diagnosis not present

## 2018-08-03 NOTE — H&P (Signed)
[name] is an 63 y.o. [place] female.   Chief Complaint: postmenopausal bleeding HPI: PMB after 6 years of menopause with failed office EMB and 2 cm thickness on u/s. Patient with multiple medical problems, will place IUD following procedure as she would not be a great surgical candidate with her medical issues, should endometrial carcinoma be found.  Past Medical History:  Diagnosis Date  . Anemia   . Blind right eye   . Chronic back pain    "my whole back" (03/18/2015)  . Chronic diastolic heart failure ([place]) 9/13   echo 03/20/15 LV Ef of 60-65%, grade 2DD and PA peak pressure: 36 mm Hg   . Coronary artery disease    Medical Rx  . Depression   . Diabetic retinopathy ([place])   . Dyspnea   . GERD (gastroesophageal reflux disease)   . Glaucoma   . History of blood transfusion "several"   "related to menopause"  . Hyperlipemia   . Hypertension   . Morbid obesity with BMI of 40.0-44.9, adult ([place])   . Myocardial infarction ([place]) "several"  . Neuropathy    hands    diabetic neuropthy  . Obstructive sleep apnea    "they say I do; I say I don't; no mask" (03/18/2015)  . Renal insufficiency    pt. denies  . Seasonal allergies   . Sleep apnea   . Type II diabetes mellitus (H[facility])    insulin dependent    Past Surgical History:  Procedure Laterality Date  . BIOPSY  02/13/2018   Procedure: BIOPSY;  Surgeon: Ladene Artist, MD;  Location: [facility];  Service: Endoscopy;;  . BREAST SURGERY     right breast biopsy  . CARDIAC CATHETERIZATION  July 2010  . CATARACT EXTRACTION, BILATERAL Bilateral   . COLONOSCOPY WITH PROPOFOL N/A 02/13/2018   Procedure: COLONOSCOPY WITH PROPOFOL;  Surgeon: Ladene Artist, MD;  Location: [facility];  Service: Endoscopy;  Laterality: N/A;  . DILATION AND CURETTAGE OF UTERUS    . EYE SURGERY Bilateral "several"  . INCISION AND DRAINAGE PERIRECTAL ABSCESS  04/03/2012  . IR US GUIDE VASC ACCESS LEFT  02/13/2018  . IR VENIPUNCTURE 46YRS/OLDER BY  MD  02/13/2018  . POLYPECTOMY  02/13/2018   Procedure: POLYPECTOMY;  Surgeon: Ladene Artist, MD;  Location: [facility];  Service: Endoscopy;;    Family History  Problem Relation Age of Onset  . Diabetes Mother   . Heart disease Mother   . Heart attack Mother   . Hypertension Mother   . Breast cancer Mother   . Colon polyps Father   . Diabetes Father   . Heart disease Father   . Heart attack Father   . Stroke Father   . Hypertension Father   . Diabetes Brother   . Diabetes Sister   . Heart disease Brother   . Heart disease Sister   . Diabetes Maternal Grandmother   . Hypertension Maternal Grandmother   . Hypertension Brother   . Hypertension Sister   . Breast cancer Cousin   . Colon cancer Cousin   . Esophageal cancer Neg Hx   . Rectal cancer Neg Hx   . Stomach cancer Neg Hx    Social History:  reports that she has never smoked. She has never used smokeless tobacco. She reports that she does not drink alcohol or use drugs.  Allergies:  Allergies  Allergen Reactions  . Ativan [Lorazepam] Anaphylaxis  . Orange Fruit [Citrus] Anaphylaxis    Pt  stated throat swelling, itching    No medications prior to admission.    A comprehensive review of systems was negative.  Last menstrual period 04/02/2012. General appearance: alert, cooperative and appears older than stated age Head: Normocephalic, without obvious abnormality, atraumatic Neck: supple, symmetrical, trachea midline Lungs: normal effort Heart: regular rate and rhythm Abdomen: soft, non-tender; bowel sounds normal; no masses,  no organomegaly Extremities: extremities normal, atraumatic, no cyanosis or edema Skin: Skin color, texture, turgor normal. No rashes or lesions Neurologic: Grossly normal   Lab Results  Component Value Date   WBC 12.2 (H) 12/20/2017   HGB 12.8 12/20/2017   HCT 39.5 12/20/2017   MCV 98 (H) 12/20/2017   PLT 291 12/20/2017   Lab Results  Component Value Date   PREGTESTUR  NEGATIVE 11/28/2015   PREGSERUM NEGATIVE 02/16/2008   .  Assessment/Plan Principal Problem:   Post-menopausal bleeding  For D and C Hysteroscopy, removal of polyp possibly, IUD placement, possibly? Risks include but are not limited to bleeding, infection, injury to surrounding structures, including bowel, bladder and ureters, blood clots, and death.  Likelihood of success is high.    [name] 08/03/2018, 4:42 PM

## 2018-08-08 ENCOUNTER — Other Ambulatory Visit: Payer: Self-pay | Admitting: General Practice

## 2018-08-08 DIAGNOSIS — N950 Postmenopausal bleeding: Secondary | ICD-10-CM

## 2018-08-08 MED ORDER — LEVONORGESTREL 20 MCG/24HR IU IUD: 1.0000 | INTRAUTERINE_SYSTEM | Freq: Once | INTRAUTERINE | 0 refills | Status: DC

## 2018-08-08 NOTE — Progress Notes (Signed)
Scheduled ultrasound 1/13 @ 2pm per Dr Kennon Rounds. Rx ordered for Mirena to [facility].   Called and informed patient of ultrasound appt as well as [facility] pharmacy may reach out to her with questions. Patient verbalized understanding and states she doesn't remember what surgery she is even having done. Told patient she is having a D&C with possible biopsy. Discussed Dr Kennon Rounds may or may not place an IUD during surgery but it is a speciality item that has to be ordered in advanced if it is needed. Patient verbalized understanding & had no other questions.

## 2018-08-09 ENCOUNTER — Encounter: Payer: Self-pay | Admitting: General Practice

## 2018-08-09 NOTE — Pre-Procedure Instructions (Signed)
KANETRA HO  08/09/2018      [facility] [address] [place], [place] [address] Phone: [phone] Fax: [phone]  [place], [place] - [place] [address] Phone: [phone] Fax: [phone]    Your procedure is scheduled on Tues., Jan. 14, 2020 from 1:28PM-2:07PM  Report to [facility] [place] at 12:00PM  Call this number if you have problems the morning of surgery:  [phone]   Remember:  Do not eat after midnight on Jan. 13th  You may drink clear liquids until 3 hours (10:30AM) prior to surgery .  Clear liquids allowed are:  Water, Juice (non-citric and without pulp), Carbonated beverages, Clear Tea, Black Coffee only, Plain Jell-O only, Gatorade and Plain Popsicles only   Please complete your PRE-SURGERY ENSURE that was provided to you 3 hours (10:30AM) prior to you surgery start time.  Please, if able, drink it in one setting. DO NOT SIP.   Take these medicines the morning of surgery with A SIP OF WATER: Diltiazem (DILACOR XR), Brimonidine-timolol (COMBIGAN), Isosorbide mononitrate (IMDUR), Metoprolol (TOPROL-XL), and Pantoprazole (PROTONIX)   If needed: Acetaminophen (TYLENOL), Baclofen (LIORESAL), MetoCLOPramide (REGLAN), and NitroGLYCERIN (NITROSTAT)  Follow your surgeon's instructions on when to stop Aspirin.  If no instructions were given by your surgeon then you will need to call the office to get those instructions.    As of today, stop taking all Other Aspirin Products, Vitamins, Fish oils, and Herbal medications. Also stop all NSAIDS i.e. Advil, Ibuprofen, Motrin, Aleve, Anaprox, Naproxen, BC, Goody Powders, and all Supplements.   WHAT DO I DO ABOUT MY DIABETES MEDICATION?  Marland Kitchen Do not take Insulin lispro protamine-lispro (HUMALOG MIX 75/25) the morning of surgery.  . THE NIGHT BEFORE SURGERY, take  ______35_____ units of _____Humalog 75/25______insulin.  How to Manage Your Diabetes Before and After Surgery  Why is it important to control my blood sugar before and after surgery? . Improving blood sugar levels before and after surgery helps healing and can limit problems. . A way of improving blood sugar control is eating a healthy diet by: o  Eating less sugar and carbohydrates o  Increasing activity/exercise o  Talking with your doctor about reaching your blood sugar goals . High blood sugars (greater than 180 mg/dL) can raise your risk of infections and slow your recovery, so you will need to focus on controlling your diabetes during the weeks before surgery. . Make sure that the doctor who takes care of your diabetes knows about your planned surgery including the date and location.  How do I manage my blood sugar before surgery? . Check your blood sugar at least 4 times a day, starting 2 days before surgery, to make sure that the level is not too high or low. o Check your blood sugar the morning of your surgery when you wake up and every 2 hours until you get to the Short Stay unit. . If your blood sugar is less than 70 mg/dL, you will need to treat for low blood sugar: o Do not take insulin. o Treat a low blood sugar (less than 70 mg/dL) with  cup of clear juice (cranberry or apple), 4 glucose tablets, OR glucose gel. Recheck blood sugar in 15 minutes after treatment (to make sure it is greater than 70 mg/dL). If your blood sugar is not greater than 70 mg/dL on recheck, call [phone]  o  for further instructions.                             . If your CBG is greater than 220 mg/dL, call the number above for further instructions.  . If you are admitted to the hospital after surgery: o Your blood sugar will be checked by the staff and you will probably be given insulin after surgery (instead of oral diabetes medicines) to make sure you have good blood sugar levels. o The goal for  blood sugar control after surgery is 80-180 mg/dL.  Reviewed and Endorsed by [facility] Patient Education Committee, August 2015     Do not wear jewelry, make-up or nail polish.  Do not wear lotions, powders, or perfumes. You may wear deodorant.  Do not shave 48 hours prior to surgery.    Do not bring valuables to the hospital.  [facility] is not responsible for any belongings or valuables.  Contacts, dentures or bridgework may not be worn into surgery.  Leave your suitcase in the car.  After surgery it may be brought to your room.  For patients admitted to the hospital, discharge time will be determined by your treatment team.  Patients discharged the day of surgery will not be allowed to drive home.   Special instructions:    [facility]- Preparing For Surgery  Before surgery, you can play an important role. Because skin is not sterile, your skin needs to be as free of germs as possible. You can reduce the number of germs on your skin by washing with CHG (chlorahexidine gluconate) Soap before surgery.  CHG is an antiseptic cleaner which kills germs and bonds with the skin to continue killing germs even after washing.    Oral Hygiene is also important to reduce your risk of infection.  Remember - BRUSH YOUR TEETH THE MORNING OF SURGERY WITH YOUR REGULAR TOOTHPASTE  Please do not use if you have an allergy to CHG or antibacterial soaps. If your skin becomes reddened/irritated stop using the CHG.  Do not shave (including legs and underarms) for at least 48 hours prior to first CHG shower. It is OK to shave your face.  Please follow these instructions carefully.   1. Shower the NIGHT BEFORE SURGERY and the MORNING OF SURGERY with CHG.   2. If you chose to wash your hair, wash your hair first as usual with your normal shampoo.  3. After you shampoo, rinse your hair and body thoroughly to remove the shampoo.  4. Use CHG as you would any other liquid soap. You can apply CHG directly  to the skin and wash gently with a scrungie or a clean washcloth.   5. Apply the CHG Soap to your body ONLY FROM THE NECK DOWN.  Do not use on open wounds or open sores. Avoid contact with your eyes, ears, mouth and genitals (private parts). Wash Face and genitals (private parts)  with your normal soap.  6. Wash thoroughly, paying special attention to the area where your surgery will be performed.  7. Thoroughly rinse your body with warm water from the neck down.  8. DO NOT shower/wash with your normal soap after using and rinsing off the CHG Soap.  9. Pat yourself dry with a CLEAN TOWEL.  10. Wear CLEAN PAJAMAS to bed the night before surgery, wear comfortable clothes the morning of surgery  11. Place CLEAN SHEETS on your bed the night  of your first shower and DO NOT SLEEP WITH PETS.  Day of Surgery:  Do not apply any lotions.  Please wear clean clothes to the hospital/surgery center.   Remember to brush your teeth WITH YOUR REGULAR TOOTHPASTE.  Please read over the following fact sheets that you were given. Pain Booklet, Coughing and Deep Breathing and Surgical Site Infection Prevention

## 2018-08-09 NOTE — Progress Notes (Signed)
Informed by [place] would need prior approval. [facility] and was informed prior approval was pending with review.

## 2018-08-10 ENCOUNTER — Encounter (HOSPITAL_COMMUNITY): Payer: Self-pay

## 2018-08-10 ENCOUNTER — Telehealth: Payer: Self-pay | Admitting: General Practice

## 2018-08-10 ENCOUNTER — Encounter (HOSPITAL_COMMUNITY)
Admission: RE | Admit: 2018-08-10 | Discharge: 2018-08-10 | Disposition: A | Discharge status: Home / Self Care | Payer: Medicare Other | Source: Ambulatory Visit | Attending: Family Medicine | Admitting: Family Medicine

## 2018-08-10 ENCOUNTER — Other Ambulatory Visit: Payer: Self-pay

## 2018-08-10 DIAGNOSIS — Z01812 Encounter for preprocedural laboratory examination: Secondary | ICD-10-CM | POA: Insufficient documentation

## 2018-08-10 LAB — CBC
HCT: 39.6 % (ref 36.0–46.0)
Hemoglobin: 12.6 g/dL (ref 12.0–15.0)
MCH: 29.9 pg (ref 26.0–34.0)
MCHC: 31.8 g/dL (ref 30.0–36.0)
MCV: 93.8 fL (ref 80.0–100.0)
Platelets: 286 10*3/uL (ref 150–400)
RBC: 4.22 MIL/uL (ref 3.87–5.11)
RDW: 14.1 % (ref 11.5–15.5)
WBC: 10.6 10*3/uL — ABNORMAL HIGH (ref 4.0–10.5)
nRBC: 0 % (ref 0.0–0.2)

## 2018-08-10 LAB — COMPREHENSIVE METABOLIC PANEL
ALT: 13 U/L (ref 0–44)
AST: 15 U/L (ref 15–41)
Albumin: 3.2 g/dL — ABNORMAL LOW (ref 3.5–5.0)
Alkaline Phosphatase: 49 U/L (ref 38–126)
Anion gap: 9 (ref 5–15)
BUN: 13 mg/dL (ref 8–23)
CO2: 22 mmol/L (ref 22–32)
Calcium: 9.4 mg/dL (ref 8.9–10.3)
Chloride: 108 mmol/L (ref 98–111)
Creatinine, Ser: 1.15 mg/dL — ABNORMAL HIGH (ref 0.44–1.00)
GFR calc Af Amer: 59 mL/min — ABNORMAL LOW (ref >60)
GFR calc non Af Amer: 51 mL/min — ABNORMAL LOW (ref >60)
Glucose, Bld: 182 mg/dL — ABNORMAL HIGH (ref 70–99)
Potassium: 3.1 mmol/L — ABNORMAL LOW (ref 3.5–5.1)
Sodium: 139 mmol/L (ref 135–145)
Total Bilirubin: 0.5 mg/dL (ref 0.3–1.2)
Total Protein: 7.4 g/dL (ref 6.5–8.1)

## 2018-08-10 LAB — HEMOGLOBIN A1C
Hgb A1c MFr Bld: 6.7 % — ABNORMAL HIGH (ref 4.8–5.6)
MEAN PLASMA GLUCOSE: 145.59 mg/dL

## 2018-08-10 LAB — GLUCOSE, CAPILLARY: Glucose-Capillary: 188 mg/dL — ABNORMAL HIGH (ref 70–99)

## 2018-08-10 NOTE — Telephone Encounter (Signed)
Patient called and left message on nurse voicemail line stating she needs to talk to Dr Kennon Rounds about her medication. Called patient and she states she had her pre-op appt today and they couldn't answer her question. Patient wants to know if she can continue taking aspirin & midol or if she needs to stop at a certain point before surgery. Called Dr Kennon Rounds who states patient can continue both medications. Informed patient. Patient verbalized understanding & had no questions.

## 2018-08-10 NOTE — Progress Notes (Signed)
PCP - Karle Plumber MD Cardiologist - Dr. Sallyanne Kuster  Chest x-ray - N/A EKG - 12/20/17 ECHO - 2017 Cardiac Cath - 2010  Sleep Study - 2013 CPAP - doesn't use CPAP  Fasting Blood Sugar - Doesn't know Checks Blood Sugar - doesn't check  Blood Thinner Instructions: N/A Aspirin Instructions: will call Dr.  Crews office  Anesthesia review: yes, EKG and cardiac clearance.   Patient denies shortness of breath, fever, cough and chest pain at PAT appointment   Patient verbalized understanding of instructions that were given to them at the PAT appointment. Patient was also instructed that they will need to review over the PAT instructions again at home before surgery.

## 2018-08-11 ENCOUNTER — Encounter: Payer: Self-pay | Admitting: Internal Medicine

## 2018-08-11 ENCOUNTER — Telehealth: Payer: Self-pay | Admitting: *Deleted

## 2018-08-11 ENCOUNTER — Ambulatory Visit (HOSPITAL_BASED_OUTPATIENT_CLINIC_OR_DEPARTMENT_OTHER): Payer: Medicare Other | Admitting: Internal Medicine

## 2018-08-11 VITALS — BP 163/88 | HR 74 | Temp 98.3°F | Resp 16 | Ht 63.0 in | Wt 284.0 lb

## 2018-08-11 DIAGNOSIS — I10 Essential (primary) hypertension: Secondary | ICD-10-CM

## 2018-08-11 DIAGNOSIS — Z0289 Encounter for other administrative examinations: Secondary | ICD-10-CM

## 2018-08-11 DIAGNOSIS — E669 Obesity, unspecified: Secondary | ICD-10-CM

## 2018-08-11 DIAGNOSIS — Z6841 Body Mass Index (BMI) 40.0 and over, adult: Secondary | ICD-10-CM

## 2018-08-11 DIAGNOSIS — M170 Bilateral primary osteoarthritis of knee: Secondary | ICD-10-CM

## 2018-08-11 DIAGNOSIS — H548 Legal blindness, as defined in USA: Secondary | ICD-10-CM | POA: Diagnosis not present

## 2018-08-11 DIAGNOSIS — E876 Hypokalemia: Secondary | ICD-10-CM

## 2018-08-11 DIAGNOSIS — R269 Unspecified abnormalities of gait and mobility: Secondary | ICD-10-CM

## 2018-08-11 MED ORDER — LOSARTAN POTASSIUM 50 MG PO TABS: 75.0000 mg | ORAL_TABLET | Freq: Every day | ORAL | 5 refills | Status: DC

## 2018-08-11 MED ORDER — POTASSIUM CHLORIDE CRYS ER 20 MEQ PO TBCR: EXTENDED_RELEASE_TABLET | ORAL | 3 refills | Status: DC

## 2018-08-11 NOTE — Patient Instructions (Signed)
Your blood pressure is not at goal.  We have increased your Losartan to 50 mg 1-1/2 tablets daily. Your potassium level is low.  I recommend increasing the potassium supplement to 2 tablets in the mornings and 1 tablet in the evenings.  Please return to the lab in about 1 week for recheck of your potassium level.  Please take the prescription for the wheelchair to any medical supply store.

## 2018-08-11 NOTE — Anesthesia Preprocedure Evaluation (Addendum)
Anesthesia Evaluation  Patient identified by MRN, date of birth, ID band Patient awake    Reviewed: Allergy & Precautions, NPO status , Patient's Chart, lab work & pertinent test results  Airway Mallampati: II  TM Distance: >3 FB Neck ROM: Full    Dental no notable dental hx.    Pulmonary sleep apnea ,    Pulmonary exam normal breath sounds clear to auscultation       Cardiovascular hypertension, Pt. on medications and Pt. on home beta blockers + CAD  Normal cardiovascular exam Rhythm:Regular Rate:Normal     Neuro/Psych negative neurological ROS  negative psych ROS   GI/Hepatic negative GI ROS, Neg liver ROS,   Endo/Other  diabetes, Insulin DependentMorbid obesity  Renal/GU negative Renal ROS  negative genitourinary   Musculoskeletal negative musculoskeletal ROS (+)   Abdominal   Peds negative pediatric ROS (+)  Hematology negative hematology ROS (+)   Anesthesia Other Findings   Reproductive/Obstetrics negative OB ROS                            Anesthesia Physical Anesthesia Plan  ASA: III  Anesthesia Plan: General   Post-op Pain Management:    Induction: Intravenous  PONV Risk Score and Plan: 3 and Ondansetron, Dexamethasone and Treatment may vary due to age or medical condition  Airway Management Planned: LMA  Additional Equipment:   Intra-op Plan:   Post-operative Plan: Extubation in OR  Informed Consent: I have reviewed the patients History and Physical, chart, labs and discussed the procedure including the risks, benefits and alternatives for the proposed anesthesia with the patient or authorized representative who has indicated his/her understanding and acceptance.     Dental advisory given  Plan Discussed with: CRNA and Surgeon  Anesthesia Plan Comments: (See PAT note 10/05/18, Konrad Felix, PA-C)      Anesthesia Quick Evaluation

## 2018-08-11 NOTE — Telephone Encounter (Signed)
Received a voicemail from [place] , A [place] with Anesthesia stating she is calling about Dr.Pratt's patient.States she normally talks with [place] but she is out of the office. States may affect surgery .   I called Bryson Ha back and she states the anesthesiologist is questioning whether she should have her surgery here or at [facility] and if she should see her cardiologist first. I asked her to discuss with Dr. Kennon Rounds and gave her number to call Dr. Kennon Rounds.

## 2018-08-11 NOTE — Progress Notes (Signed)
Pt states she is needing a regular wheelchair  Pt states she is needing a wheelchair because of the problems she is having with knees  Pt states she was told she was needing knee replacements on b/l knees  Pt states she has been using arthritis cream every night

## 2018-08-11 NOTE — Telephone Encounter (Signed)
Received a voicemail from [place] at [employer] stating he is calling about a prior auth for mirena IUD . Requested a call back refernce #[id number]. If not received by 08/12/18 ,may be denied.  I called back and provided further clinical information. She states they will review and get back to [name].

## 2018-08-11 NOTE — Progress Notes (Signed)
Patient ID: DELORISE HUNKELE, female    DOB: 05-19-1956  MRN: [id number]  CC: wheelchair eval   Subjective: Marvalene Tewksbury is a 63 y.o. female who presents for manual wheelchair eval Her concerns today include:  Pt with hx of DM with probable gastroparesis,retinopathy,HTN, HL, CAD, chronic diastolic CHF, obesity,IDA, OA knees, Vit B 12 def  Pt requesting eval for manual wheelchair.  She would like to have one to improve mobility inside and outside the home.  She has history of advanced DJD of both knees.  She is not able to stand to cook or wash dishes.  When she goes to do her grocery shopping at [place] she tries to use 1 of them motorized wheelchairs but she is legally blind in the right eye and so bumps into things that she cannot see to her right.  She has had 1 fall in the past year.  She has a cane which she takes with her when she is going outside of the house.  She uses her walker in the house but can only stand for a limited period of time.  She can walk about 100 feet with her cane or walker before having to stop because of knee pain and discomfort. -She feels she can propel herself in a manual wheelchair.  Her husband can also push her around in the wheelchair when she goes grocery shopping. When she walks she feels like her knees are slipping and the muscles in her legs tighten up.  She has seen orthopedics in the past and was told that she needs knee replacement surgery.    HTN:  BP elev today.  Took meds already today.  She tries to limit salt in the foods.  Will have D[facility] next Tues at [facility] due to postmenopausal bleeding.  Had lab test done yesterday the results of which I have reviewed.  Potassium level was low.  She endorses taking potassium supplement 20 mEq twice a day.  Her A1c was less than 7. Patient Active Problem List   Diagnosis Date Noted  . Legally blind in right eye, as defined in [place] 08/11/2018  . Bilateral carpal tunnel syndrome 04/13/2018  .  Adenomatous polyp of colon 04/13/2018  . Fibrocystic breast changes, right 12/12/2017  . Drug-induced constipation 09/15/2017  . Primary osteoarthritis of both knees 04/05/2017  . Chronic bilateral low back pain 03/07/2017  . Chronic pain of right knee 03/07/2017  . Vitamin B12 deficiency 12/09/2016  . Incidental lung nodule, > 42m and < 840m02/13/2018  . Vitreous hemorrhage of right eye ([facility]  . Diabetic gastroparesis associated with type 2 diabetes mellitus (HCL[place]02/05/2016  . Controlled type 2 diabetes mellitus with complication, with long-term current use of insulin ([facility] [place]02/05/2016  . Chronic renal insufficiency, stage III (moderate) (HC[facility]08/17/2016  . Prolonged Q-T interval on ECG 08/10/2013  . Dyslipidemia 04/02/2013  . Chronic diastolic heart failure, NYHA class 3 (HCWak[place]09/01/2012  . Presumed OSA (obstructive sleep apnea) 04/06/2012  . NSTEMI  post-op 04/04/12-medical Rx 04/04/2012  . Super obesity 01/17/2012  . GERD 08/21/2009  . Post-menopausal bleeding 03/20/2009  . PERIPHERAL EDEMA 03/20/2009  . Coronary atherosclerosis-not a surgical or PCI candidate 2010 02/27/2009  . Proliferative diabetic retinopathy (HCF[place]03/01/2008  . DETACHED RETINA, BILATERAL, HX OF 09/07/2007  . History of chronic Iron defeicency anemia with acute post op anemia, transfused after debridment 04/04/12 04/17/2007  . Essential hypertension 04/17/2007     Current Outpatient Medications on File Prior  to Visit  Medication Sig Dispense Refill  . ACCU-CHEK SOFTCLIX LANCETS lancets Use as instructed for 3 times daily blood glucose monitoring 100 each 5  . acetaminophen (TYLENOL) 500 MG tablet Take 1 tablet (500 mg total) by mouth every 6 (six) hours as needed. (Patient taking differently: Take 500 mg by mouth every 6 (six) hours as needed (pain.). ) 30 tablet 0  . acetaZOLAMIDE (DIAMOX) 500 MG capsule Take 500 mg by mouth 2 (two) times daily.    Marland Kitchen aspirin EC 81 MG tablet Take 81 mg by mouth daily.    Marland Kitchen  atorvastatin (LIPITOR) 80 MG tablet TAKE ONE TABLET BY MOUTH ONCE DAILY IN THE EVENING AT  6PM (Patient taking differently: Take 80 mg by mouth every evening. ) 90 tablet 3  . baclofen (LIORESAL) 10 MG tablet Take 1 tablet (10 mg total) by mouth at bedtime as needed for muscle spasms. (Patient taking differently: Take 10 mg by mouth daily as needed for muscle spasms. ) 30 each 5  . Blood Glucose Monitoring Suppl (ACCU-CHEK AVIVA PLUS) w/Device KIT Used as directed (Patient not taking: Reported on 07/10/2018) 1 kit 0  . Blood Pressure Monitoring (BLOOD PRESSURE MONITOR/ARM) DEVI 1 each by Does not apply route daily. ICD 10, I10 and I50.32 1 Device 0  . brimonidine-timolol (COMBIGAN) 0.2-0.5 % ophthalmic solution Place 1 drop into both eyes 2 (two) times daily.     . calcium carbonate (TUMS - DOSED IN MG ELEMENTAL CALCIUM) 500 MG chewable tablet Chew 1 tablet by mouth 3 (three) times daily as needed for indigestion.     . Cyanocobalamin (VITAMIN B-12) 1000 MCG TABS Take 1,000 mcg by mouth daily. 90 tablet 1  . diltiazem (DILACOR XR) 240 MG 24 hr capsule Take 1 capsule (240 mg total) by mouth daily. 90 capsule 3  . docusate sodium (COLACE) 100 MG capsule Take 1 capsule (100 mg total) by mouth 2 (two) times daily. (Patient taking differently: Take 100 mg by mouth 2 (two) times daily as needed (CONSTIPATION). ) 60 capsule 3  . ferrous sulfate 325 (65 FE) MG tablet Take 325 mg by mouth daily with breakfast.    . furosemide (LASIX) 40 MG tablet Take 1 tablet (40 mg total) by mouth 2 (two) times daily. 180 tablet 3  . furosemide (LASIX) 80 MG tablet Take one 26m tablet by mouth with furosemide 447mfor a total dose of 12061mID (Patient taking differently: Take 80 mg by mouth 2 (two) times daily. Take one 55m8mblet by mouth with furosemide 40mg21m a total dose of 120mg 32m 180 tablet 1  . glucose blood test strip Use TID before meals / Dx E11.9 100 each 12  . glucose monitoring kit (FREESTYLE) monitoring  kit 1 each by Does not apply route 4 (four) times daily - after meals and at bedtime. 1 each 1  . insulin lispro protamine-lispro (HUMALOG MIX 75/25) (75-25) 100 UNIT/ML SUSP injection INJECT 50 UNITS INTO THE SKIN 2 TIMES DAILY WITH A MEAL (Patient taking differently: Inject 50 Units into the skin 2 (two) times daily. INJECT 50 UNITS INTO THE SKIN 2 TIMES DAILY WITH A MEAL) 40 mL 11  . Insulin Syringe-Needle U-100 (RELION INSULIN SYRINGE) 31G X 15/64" 1 ML MISC Use as directed for twice daily insulin adminstration. E11.9 100 each 5  . isosorbide mononitrate (IMDUR) 120 MG 24 hr tablet TAKE 1 TABLET BY MOUTH ONCE DAILY (Patient taking differently: Take 120 mg by mouth daily. ) 90 tablet  3  . levonorgestrel (MIRENA) 20 MCG/24HR IUD 1 Intra Uterine Device (1 each total) by Intrauterine route once for 1 dose. 1 each 0  . LUMIGAN 0.01 % SOLN Place 1 drop into the right eye at bedtime.     . Menthol-Methyl Salicylate (MUSCLE RUB EX) Apply 1 application topically daily as needed (knee pain).     Marland Kitchen metoCLOPramide (REGLAN) 5 MG tablet 1 tab twice a day before the two larges meals of the day (Patient taking differently: Take 5 mg by mouth 2 (two) times daily. 1 tab twice a day before the two larges meals of the day) 60 tablet 5  . metolazone (ZAROXOLYN) 2.5 MG tablet 1 tab once every two weeks PRN swelling lower extremities (Patient taking differently: Take 2.5 mg by mouth See admin instructions. TAKE 1 TABLET (2.5 MG) BY MOUTH EVERY 14 DAYS AS NEEDED FOR LEG/FOOT SWELLING) 10 tablet 3  . metoprolol (TOPROL-XL) 200 MG 24 hr tablet TAKE 1 TABLET BY MOUTH ONCE DAILY (Patient taking differently: Take 200 mg by mouth daily. ) 90 tablet 2  . nitroGLYCERIN (NITROSTAT) 0.4 MG SL tablet DISSOLVE ONE TABLET UNDER THE TONGUE EVERY 5 MINUTES AS NEEDED FOR CHEST PAIN.  DO NOT EXCEED A TOTAL OF 3 DOSES IN 15 MINUTES NOW (Patient taking differently: Place 0.4 mg under the tongue every 5 (five) minutes x 3 doses as needed for  chest pain. ) 25 tablet 3  . pantoprazole (PROTONIX) 40 MG tablet TAKE ONE TABLET BY MOUTH ONCE DAILY (Patient taking differently: Take 40 mg by mouth daily. ) 30 tablet 2   No current facility-administered medications on file prior to visit.     Allergies  Allergen Reactions  . Ativan [Lorazepam] Anaphylaxis  . Orange Fruit [Citrus] Anaphylaxis and Other (See Comments)    Pt stated throat swelling, itching    Social History   Socioeconomic History  . Marital status: Married    Spouse name: Not on file  . Number of children: 0  . Years of education: Not on file  . Highest education level: Not on file  Occupational History  . Occupation: disabled  Social Needs  . Financial resource strain: Not on file  . Food insecurity:    Worry: Not on file    Inability: Not on file  . Transportation needs:    Medical: Not on file    Non-medical: Not on file  Tobacco Use  . Smoking status: Never Smoker  . Smokeless tobacco: Never Used  Substance and Sexual Activity  . Alcohol use: No  . Drug use: Never  . Sexual activity: Yes  Lifestyle  . Physical activity:    Days per week: Not on file    Minutes per session: Not on file  . Stress: Not on file  Relationships  . Social connections:    Talks on phone: Not on file    Gets together: Not on file    Attends religious service: Not on file    Active member of club or organization: Not on file    Attends meetings of clubs or organizations: Not on file    Relationship status: Not on file  . Intimate partner violence:    Fear of current or ex partner: Not on file    Emotionally abused: Not on file    Physically abused: Not on file    Forced sexual activity: Not on file  Other Topics Concern  . Not on file  Social History Narrative  . Not  on file    Family History  Problem Relation Age of Onset  . Diabetes Mother   . Heart disease Mother   . Heart attack Mother   . Hypertension Mother   . Breast cancer Mother   . Colon  polyps Father   . Diabetes Father   . Heart disease Father   . Heart attack Father   . Stroke Father   . Hypertension Father   . Diabetes Brother   . Diabetes Sister   . Heart disease Brother   . Heart disease Sister   . Diabetes Maternal Grandmother   . Hypertension Maternal Grandmother   . Hypertension Brother   . Hypertension Sister   . Breast cancer Cousin   . Colon cancer Cousin   . Esophageal cancer Neg Hx   . Rectal cancer Neg Hx   . Stomach cancer Neg Hx     Past Surgical History:  Procedure Laterality Date  . BIOPSY  02/13/2018   Procedure: BIOPSY;  Surgeon: Ladene Artist, MD;  Location: [facility];  Service: Endoscopy;;  . BREAST SURGERY     right breast biopsy  . CARDIAC CATHETERIZATION  July 2010  . CATARACT EXTRACTION, BILATERAL Bilateral   . COLONOSCOPY WITH PROPOFOL N/A 02/13/2018   Procedure: COLONOSCOPY WITH PROPOFOL;  Surgeon: Ladene Artist, MD;  Location: [facility];  Service: Endoscopy;  Laterality: N/A;  . DILATION AND CURETTAGE OF UTERUS    . EYE SURGERY Bilateral "several"  . INCISION AND DRAINAGE PERIRECTAL ABSCESS  04/03/2012  . IR US GUIDE VASC ACCESS LEFT  02/13/2018  . IR VENIPUNCTURE 73YRS/OLDER BY MD  02/13/2018  . POLYPECTOMY  02/13/2018   Procedure: POLYPECTOMY;  Surgeon: Ladene Artist, MD;  Location: [facility];  Service: Endoscopy;;    ROS: Review of Systems Negative except as above. PHYSICAL EXAM: BP (!) 163/88   Pulse 74   Temp 98.3 F (36.8 C) (Oral)   Resp 16   Ht _0  (1.6 m)   Wt 284 lb (128.8 kg)   LMP 04/02/2012   SpO2 98%   BMI 50.31 kg/m   Wt Readings from Last 3 Encounters:  08/11/18 284 lb (128.8 kg)  08/10/18 281 lb 8.4 oz (127.7 kg)  07/10/18 282 lb 9.6 oz (128.2 kg)  BP 140/65  Physical Exam  General appearance - alert, well appearing, obese older African-American female and in no distress Mental status - normal mood, behavior, speech, dress, motor activity, and thought processes Chest - clear to  auscultation, no wheezes, rales or rhonchi, symmetric air entry Heart - normal rate, regular rhythm, normal S1, S2, no murmurs, rubs, clicks or gallops Musculoskeletal -patient ambulates with a cane.  Gait is slow with decreased foot to floor clearance.  Knee joints are large.  Mild to moderate crepitus with passive range of motion with the left knee joint and mild crepitus with passive range of motion on the right Power upper extremities 5/5 bilaterally. Extremities -no lower extremity edema  Depression screen [facility] 2/9 07/10/2018 04/13/2018 11/15/2017  Decreased Interest _1 Down, Depressed, Hopeless _2 PHQ - 2 Score _3 Altered sleeping _4 Tired, decreased energy _5 Change in appetite _6 Feeling bad or failure about yourself  _7 Trouble concentrating _8 Moving slowly or fidgety/restless _9 Suicidal thoughts 0 1 2  PHQ-9 Score _10 Some  recent data might be hidden     Chemistry      Component Value Date/Time   NA 139 08/10/2018 0925   NA 140 12/20/2017 0949   K 3.1 (L) 08/10/2018 0925   CL 108 08/10/2018 0925   CO2 22 08/10/2018 0925   BUN 13 08/10/2018 0925   BUN 14 12/20/2017 0949   CREATININE 1.15 (H) 08/10/2018 0925   CREATININE 1.30 (H) 09/28/2016 1004      Component Value Date/Time   CALCIUM 9.4 08/10/2018 0925   ALKPHOS 49 08/10/2018 0925   AST 15 08/10/2018 0925   ALT 13 08/10/2018 0925   BILITOT 0.5 08/10/2018 0925   BILITOT 0.4 12/20/2017 0949     Lab Results  Component Value Date   WBC 10.6 (H) 08/10/2018   HGB 12.6 08/10/2018   HCT 39.6 08/10/2018   MCV 93.8 08/10/2018   PLT 286 08/10/2018   Lab Results  Component Value Date   HGBA1C 6.7 (H) 08/10/2018    ASSESSMENT AND PLAN:  1. Primary osteoarthritis of both knees 2. Gait disturbance -Patient would benefit from having a manual wheelchair to increase mobility and allow her to better function at home and also to be able to get around and do some of her ADLs  like shopping when she is away from home.  She has the strength in the upper extremities to be able to manually propel herself  3. Legally blind in right eye, as defined in [place]   4. Essential hypertension Not at goal.  Increase Cozaar to 75 mg daily - losartan (COZAAR) 50 MG tablet; Take 1.5 tablets (75 mg total) by mouth daily.  Dispense: 45 tablet; Refill: 5  5. Hypokalemia Increase potassium to 2 tablets in the morning and 1 in the evening.  Patient to return to the lab in about 1 week for repeat potassium check. - potassium chloride SA (K-DUR,KLOR-CON) 20 MEQ tablet; 2 tabs PO in a.m and 1 tab PO Q p.m  Dispense: 90 tablet; Refill: 3   Patient was given the opportunity to ask questions.  Patient verbalized understanding of the plan and was able to repeat key elements of the plan.   Orders Placed This Encounter  Procedures  . Basic Metabolic Panel     Requested Prescriptions   Signed Prescriptions Disp Refills  . losartan (COZAAR) 50 MG tablet 45 tablet 5    Sig: Take 1.5 tablets (75 mg total) by mouth daily.  . potassium chloride SA (K-DUR,KLOR-CON) 20 MEQ tablet 90 tablet 3    Sig: 2 tabs PO in a.m and 1 tab PO Q p.m    Return in about 7 weeks (around 09/29/2018).  Karle Plumber, MD, FACP

## 2018-08-11 NOTE — Progress Notes (Signed)
Anesthesia Chart Review:  Case:  [id number] Date/Time:  08/15/18 1313   Procedure:  DILATATION AND CURETTAGE /HYSTEROSCOPY (N/A )   Anesthesia type:  Choice   Pre-op diagnosis:  PMB   Location:  [place] OR ROOM 4 / [place] [facility]   Surgeon:  [name], MD      DISCUSSION: Patient is a 63 year old female scheduled for the above procedure. According to/07/10/18 visit with Dr. Kennon Rounds, [facility] with hysteroscopy and possible IUD placement and endometrial sampling scheduled for post-menopausal bleeding. If endometrial carcinoma found, she would consider IUD placement due to poor surgical candidate.   History includes never smoker, CAD (NSTEMI 02/25/09, no good PCI or bypass options; post-op NSTEMI 04/04/12 in setting of perirectal abscess/sepsis, nitrates added), diastolic CHF, HTN, HLD, GERD, DM2 (with retinopathy, neuropathy), glaucoma (blind right eye), dyspnea, CKD, anemia, OSA (mild 2013). BMI is consistent with morbid obesity.  Last seen by cardiologist Sanda Klein, MD on 12/20/17. In regards to her CAD, he wrote,  "CAD: due to diffuse nature of disease, not a candidate for either surgical or percutaneous revascularization.  Inadvertently off Ranexa for at least 2 weeks without any worsening of angina pectoris.  We will not renew this medication.  She is on beta-blockers and calcium channel blockers and long-acting nitrates." She was hypervolemic on exam, so adjustments made in her diuretic regimen. Six month follow-up recommended. - Cardiac meds: ASA 81 mg, acetazolamide (Diamox) 500 mg BID, Lipitor 80 mg, diltiazem XR 240 mg daily, furosemide 120 mg BID, isosorbide mononitrate 120 mg daily, losartan 50 mg daily, Toprol XL 200 mg daily, metolazone (Zaroxolyn) 2.5 mg Q 14 days PRN swelling, Nitro PRN. She was also on Plavix 75 mg at her 11/2017 office visit which was held for 12/2017 colonoscopy and is no longer on her medication list--it's not clear to me if it was ever officially discontinued by cardiology.  She  denied chest pain, SOB, cough, fever per PAT RN notation. Okay to continue ASA per surgeon.  Reviewed above with anesthesiologist Adele Barthel, MD. Given her multiple co-morbidities as outlined above would recommend that case not be done at [facility], but rather the [facility] (surgical center versus hospital so specialists would be available for post-oeprative support if needed). In addition, she is overdue for cardiology follow-up and has known CAD not felt to be suitable for intervention with history of post-op MI in 2013 and was hypervolemic at last visit in May, so recommendation also to see cardiology prior to surgery. I have spoken with Dr. Kennon Rounds and her scheduler Drema Balzarine about anesthesiologist's recommendations. They will work on addressing.   VS: BP (!) 188/84   Pulse 70   Temp 37 C   Resp 20   Ht _0  (1.6 m)   Wt 127.7 kg   LMP 04/02/2012   SpO2 100%   BMI 49.87 kg/m    PROVIDERS: Ladell Pier, MD is PCP ([place]). Last visit 04/13/18. Has appointment for wheelchair 08/11/18. In December, Dr. Kennon Rounds had asked patient to follow-up regarding elevated BP, but I don't see that this has happened yet. Sallyanne Kuster, Dani Gobble, MD is cardiologist. Last visit 12/20/17. See Discussion. Pearson Grippe, MD is nephrologist. 03/02/18 office note scanned under Media tab.Stable CKD stage III with one year follow-up recommended.   LABS: Preoperative labs acceptable although K 3.1 (already on KCl supplement). Cr 1.15. H/H 12.6/39.6. A1c 6.7%. (all labs ordered are listed, but only abnormal results are  displayed)  Labs Reviewed  GLUCOSE, CAPILLARY - Abnormal; Notable for the following components:      Result Value   Glucose-Capillary 188 (*)    All other components within normal limits  HEMOGLOBIN A1C - Abnormal; Notable for the following components:   Hgb A1c MFr Bld 6.7 (*)    All other components within normal limits  CBC - Abnormal; Notable  for the following components:   WBC 10.6 (*)    All other components within normal limits  COMPREHENSIVE METABOLIC PANEL - Abnormal; Notable for the following components:   Potassium 3.1 (*)    Glucose, Bld 182 (*)    Creatinine, Ser 1.15 (*)    Albumin 3.2 (*)    GFR calc non Af Amer 51 (*)    GFR calc Af Amer 59 (*)    All other components within normal limits    Sleep study 04/28/12: IMPRESSION/RECOMMENDATION: 1. Mild obstructive sleep apnea/hypopnea syndrome, with an AHI of 16     events per hour and oxygen desaturation as low as 80%.  Treatment     for this degree of sleep apnea can include a trial of weight loss     alone, upper airway surgery, dental appliance, and also CPAP.   IMAGES:   EKG: 12/20/17 ([facility]): NSR, right BBB. Inferior infarct (age undetermined).   CV: Echo 02/10/16: Study Conclusions - Left ventricle: The cavity size was normal. Wall thickness was   normal. Systolic function was normal. The estimated ejection   fraction was in the range of 55% to 60%. Wall motion was normal;   there were no regional wall motion abnormalities. - Left atrium: The atrium was mildly dilated.   Cardiac cath 7/29/190:  - LAD:  The LAD extended down to the apex of the heart and was by far the best of her vessels. There was mid less than 40% irregularities in the LAD.  The first diagonal was small with ostial 70% and the remainder of the diagonal was diffusely diseased. - Circumflex:  The circumflex gave rise to what appears to be three OM vessels.  The circumflex itself was totally occluded in about its midportion.  The first OM was very small with a mid area of 80% narrowing.  The second and the third OM vessels appear to be occluded from the native circulation.  There was late filling of the OM-2 and OM-3 distally.  These vessels appeared to be very small. - Right coronary artery:  This vessel was diffusely diseased.  There is proximal 80% narrowing, mid sequential 80%  and 90% narrowing, distal 90% narrowing.  The PDA was occluded.  There are two small posterior lateral vessels. - Ventriculography: The inferior wall to be severely hypokinetic to akinetic.  The anterior wall and apical segments showed good contractility.  The ejection fraction was approximately 50%.  The end-diastolic pressure was 24.  No mitral regurgitation was appreciated. CONCLUSION: 1. Inferior wall motion abnormality, but despite this was relatively low normal ejection fraction. 2. Diffuse coronary disease involving the RCA circumflex and the first diagonal. 3. No renal artery stenosis. 4. No abdominal aortic aneurysm. DISCUSSION:  Clearly CABG would be the best therapy for this lady; however, I am not sure there are distal targets for bypass.  I will ask CVTS to render an opinion.  Medical therapy is the only option.  There is nothing in the circumflex that is amenable to intervention.  The first diagonal is too small for intervention and the RCA although  having some areas that could possibly be treated with PCI, supplies are totally occluded PDA, which would be a very little value should we try to open the right without having some runoff in the PDA vessel. (Seen by CT surgeon Gilford Raid, MD: CABG not recommended given small diffusely diseased vessels with high probability for graft failure in an already very high surgical risk patient due to morbid obesity and poorly controlled diabetes.)   Past Medical History:  Diagnosis Date  . Anemia   . Blind right eye   . Chronic back pain    "my whole back" (03/18/2015)  . Chronic diastolic heart failure ([place]) 9/13   echo 03/20/15 LV Ef of 60-65%, grade 2DD and PA peak pressure: 36 mm Hg   . Coronary artery disease    Medical Rx  . Diabetic retinopathy ([place])   . Dyspnea   . GERD (gastroesophageal reflux disease)   . Glaucoma   . History of blood transfusion "several"   "related to menopause"  . Hyperlipemia   . Hypertension   . Morbid  obesity with BMI of 40.0-44.9, adult ([place])   . Myocardial infarction ([place]) "several"  . Neuropathy    hands    diabetic neuropthy  . Obstructive sleep apnea    "they say I do; I say I don't; no mask" (03/18/2015)  . Renal insufficiency    pt. denies  . Seasonal allergies   . Sleep apnea   . Type II diabetes mellitus (H[facility])    insulin dependent    Past Surgical History:  Procedure Laterality Date  . BIOPSY  02/13/2018   Procedure: BIOPSY;  Surgeon: Ladene Artist, MD;  Location: [facility];  Service: Endoscopy;;  . BREAST SURGERY     right breast biopsy  . CARDIAC CATHETERIZATION  July 2010  . CATARACT EXTRACTION, BILATERAL Bilateral   . COLONOSCOPY WITH PROPOFOL N/A 02/13/2018   Procedure: COLONOSCOPY WITH PROPOFOL;  Surgeon: Ladene Artist, MD;  Location: [facility];  Service: Endoscopy;  Laterality: N/A;  . DILATION AND CURETTAGE OF UTERUS    . EYE SURGERY Bilateral "several"  . INCISION AND DRAINAGE PERIRECTAL ABSCESS  04/03/2012  . IR US GUIDE VASC ACCESS LEFT  02/13/2018  . IR VENIPUNCTURE 81YRS/OLDER BY MD  02/13/2018  . POLYPECTOMY  02/13/2018   Procedure: POLYPECTOMY;  Surgeon: Ladene Artist, MD;  Location: [facility];  Service: Endoscopy;;    MEDICATIONS: . ACCU-CHEK SOFTCLIX LANCETS lancets  . acetaminophen (TYLENOL) 500 MG tablet  . acetaZOLAMIDE (DIAMOX) 500 MG capsule  . aspirin EC 81 MG tablet  . atorvastatin (LIPITOR) 80 MG tablet  . baclofen (LIORESAL) 10 MG tablet  . Blood Glucose Monitoring Suppl (ACCU-CHEK AVIVA PLUS) w/Device KIT  . Blood Pressure Monitoring (BLOOD PRESSURE MONITOR/ARM) DEVI  . brimonidine-timolol (COMBIGAN) 0.2-0.5 % ophthalmic solution  . calcium carbonate (TUMS - DOSED IN MG ELEMENTAL CALCIUM) 500 MG chewable tablet  . Cyanocobalamin (VITAMIN B-12) 1000 MCG TABS  . diltiazem (DILACOR XR) 240 MG 24 hr capsule  . docusate sodium (COLACE) 100 MG capsule  . ferrous sulfate 325 (65 FE) MG tablet  . furosemide (LASIX) 40 MG tablet   . furosemide (LASIX) 80 MG tablet  . glucose blood test strip  . glucose monitoring kit (FREESTYLE) monitoring kit  . insulin lispro protamine-lispro (HUMALOG MIX 75/25) (75-25) 100 UNIT/ML SUSP injection  . Insulin Syringe-Needle U-100 (RELION INSULIN SYRINGE) 31G X 15/64" 1 ML MISC  . isosorbide mononitrate (IMDUR) 120 MG 24 hr tablet  .  levonorgestrel (MIRENA) 20 MCG/24HR IUD  . losartan (COZAAR) 50 MG tablet  . LUMIGAN 0.01 % SOLN  . Menthol-Methyl Salicylate (MUSCLE RUB EX)  . metoCLOPramide (REGLAN) 5 MG tablet  . metolazone (ZAROXOLYN) 2.5 MG tablet  . metoprolol (TOPROL-XL) 200 MG 24 hr tablet  . nitroGLYCERIN (NITROSTAT) 0.4 MG SL tablet  . pantoprazole (PROTONIX) 40 MG tablet  . potassium chloride SA (K-DUR,KLOR-CON) 20 MEQ tablet   No current facility-administered medications for this encounter.     Myra Gianotti, PA-C Surgical Short Stay/Anesthesiology [facility] Phone [phone] [facility] Phone [phone] 08/11/2018 2:03 PM

## 2018-08-14 ENCOUNTER — Encounter (HOSPITAL_COMMUNITY): Payer: Self-pay

## 2018-08-14 ENCOUNTER — Ambulatory Visit (HOSPITAL_COMMUNITY)
Admission: RE | Admit: 2018-08-14 | Discharge: 2018-08-14 | Disposition: A | Discharge status: Home / Self Care | Payer: Medicare Other | Source: Ambulatory Visit | Attending: Family Medicine | Admitting: Family Medicine

## 2018-08-14 DIAGNOSIS — N950 Postmenopausal bleeding: Secondary | ICD-10-CM | POA: Insufficient documentation

## 2018-08-16 ENCOUNTER — Telehealth: Payer: Self-pay | Admitting: Internal Medicine

## 2018-08-16 ENCOUNTER — Other Ambulatory Visit: Payer: Self-pay | Admitting: Internal Medicine

## 2018-08-16 ENCOUNTER — Telehealth: Payer: Self-pay | Admitting: Cardiovascular Disease

## 2018-08-16 NOTE — Telephone Encounter (Signed)
Please address

## 2018-08-16 NOTE — Telephone Encounter (Signed)
LMOVM TO CONTACT CLINIC BACK [phone] TO SET UP SURGICAL CLEARANCE APPOINTMENT  

## 2018-08-16 NOTE — Telephone Encounter (Signed)
   Primary Cardiologist:Mihai Croitoru, MD  Chart reviewed as part of pre-operative protocol coverage. Because of [name]'s past medical history and time since last visit, he/she will require a follow-up visit in order to better assess preoperative cardiovascular risk.  Pre-op covering staff: - Please schedule appointment and call patient to inform them. - Please contact requesting surgeon's office via preferred method (i.e, phone, fax) to inform them of need for appointment prior to surgery.  [place], [place]  08/16/2018, 1:03 PM

## 2018-08-16 NOTE — Telephone Encounter (Signed)
Patient called to inform Nurse that paperwork for her wheelchair was not completed on its entirety, please follow up with  -[facility] [address] [place], [place] [address] [phone] p

## 2018-08-16 NOTE — Telephone Encounter (Signed)
New Message            [facility] Pre-operative Risk Assessment    Request for surgical clearance:  What type of surgery is being performed? DNC Hysteroscopy 1. When is this surgery scheduled? March 06/2019  2. What type of clearance is required (medical clearance vs. Pharmacy clearance to hold med vs. Both)? Medical   3. Are there any medications that need to be held prior to surgery and how long? No  4. Practice name and name of physician performing surgery? [facility]   5. What is your office phone number [phone]   7.   What is your office fax number [phone]  8.   Anesthesia type (None, local, MAC, general) ?  Not Sure             Can we clear patient without an appt?   Tyson Dense Skeen 08/16/2018, 10:27 AM  _________________________________________________________________   (provider comments below)

## 2018-08-16 NOTE — Telephone Encounter (Signed)
We have received paperwork from [facility] for wheelchair Dr. Wynetta Emery will look at form and will fill out and once I receive form from Dr. Wynetta Emery will fax to [facility]

## 2018-08-17 ENCOUNTER — Other Ambulatory Visit: Payer: Self-pay

## 2018-08-17 ENCOUNTER — Telehealth: Payer: Self-pay

## 2018-08-17 ENCOUNTER — Telehealth: Payer: Self-pay | Admitting: General Practice

## 2018-08-17 MED ORDER — MISOPROSTOL 200 MCG PO TABS: ORAL_TABLET | ORAL | 0 refills | Status: DC

## 2018-08-17 NOTE — Telephone Encounter (Signed)
Patient has been advised to take Cyctotec 257mmg the night before her procedure and 200mg  the morning off.

## 2018-08-17 NOTE — Telephone Encounter (Signed)
Patient is coming into the office 2/5 for endo bx with IUD insertion rather than surgery. Per Dr Kennon Rounds, patient needs cytotec 2107mcg the night before and 253mcg the morning of. Called & informed patient of prescription & directions. Patient verbalized understanding & had no questions.

## 2018-08-18 NOTE — Telephone Encounter (Signed)
SPOKE WITH PT AND PT IS SCHEDULED TO SEE [facility]  AT NL 09-04-18 AT 8:30 A  PT ASKED TO COME IN 15 MINS BEFORE APPT  TIME

## 2018-08-18 NOTE — Telephone Encounter (Signed)
Faxed paperwork to [facility]

## 2018-08-22 ENCOUNTER — Other Ambulatory Visit: Payer: Self-pay | Admitting: Internal Medicine

## 2018-08-22 DIAGNOSIS — I2510 Atherosclerotic heart disease of native coronary artery without angina pectoris: Secondary | ICD-10-CM

## 2018-08-29 ENCOUNTER — Other Ambulatory Visit: Payer: Self-pay | Admitting: Internal Medicine

## 2018-08-29 DIAGNOSIS — E1143 Type 2 diabetes mellitus with diabetic autonomic (poly)neuropathy: Secondary | ICD-10-CM

## 2018-08-29 DIAGNOSIS — K3184 Gastroparesis: Principal | ICD-10-CM

## 2018-09-03 NOTE — Progress Notes (Signed)
Cardiology Office Note   Date:  09/04/2018   ID:  [name], [name] 1956/06/23, MRN [id number]  PCP:  Ladell Pier, MD  Cardiologist: Dr. Sallyanne Kuster   Chief Complaint  Patient presents with  . Coronary Artery Disease  . Shortness of Breath  . Pre-op Exam     History of Present Illness: [name] is a 63 y.o. female who presents for ongoing assessment and management of multivessel CAD with diffuse+involvement , which makes her a poor candidate for surgical or PCI, chronic diastolic CHF, with super obesity, long standing severe diabetes with multiple microvascular and macrovascular complications, along with CKD. She has NYHA functional Class III exertional dyspnea.   When last seen by Dr. Sallyanne Kuster on 12/20/2017, she was found to be volume overloaded, had run out of Ranexa and potassium. She had not taken any metolazone in months. She was to have colonoscopy by Dr. Fuller Plan. She was cleared for colonoscopy and was okay to hold clopidogrel for 5 days.   She is now scheduled for a [facility] and hysterectomy, on October 11, 2018. She is here for pre-operative evaluation and recommendation for anticoagulation. This is at the request of Center for [facility], Dr. Kennon Rounds.   She comes today without cardiac complaints. She has been having diarrhea frequently over the last few days. She denies changes in her foods or medications.   Past Medical History:  Diagnosis Date  . Anemia   . Blind right eye   . Chronic back pain    "my whole back" (03/18/2015)  . Chronic diastolic heart failure ([place]) 9/13   echo 03/20/15 LV Ef of 60-65%, grade 2DD and PA peak pressure: 36 mm Hg   . Coronary artery disease    Medical Rx  . Diabetic retinopathy ([place])   . Dyspnea   . GERD (gastroesophageal reflux disease)   . Glaucoma   . History of blood transfusion "several"   "related to menopause"  . Hyperlipemia   . Hypertension   . Morbid obesity with BMI of 40.0-44.9, adult ([place])   . Myocardial  infarction ([place]) "several"  . Neuropathy    hands    diabetic neuropthy  . Obstructive sleep apnea    "they say I do; I say I don't; no mask" (03/18/2015)  . Renal insufficiency    pt. denies  . Seasonal allergies   . Sleep apnea   . Type II diabetes mellitus (HCC)    insulin dependent    Past Surgical History:  Procedure Laterality Date  . BIOPSY  02/13/2018   Procedure: BIOPSY;  Surgeon: Ladene Artist, MD;  Location: [facility] ENDOSCOPY;  Service: Endoscopy;;  . BREAST SURGERY     right breast biopsy  . CARDIAC CATHETERIZATION  July 2010  . CATARACT EXTRACTION, BILATERAL Bilateral   . COLONOSCOPY WITH PROPOFOL N/A 02/13/2018   Procedure: COLONOSCOPY WITH PROPOFOL;  Surgeon: Ladene Artist, MD;  Location: [facility] ENDOSCOPY;  Service: Endoscopy;  Laterality: N/A;  . DILATION AND CURETTAGE OF UTERUS    . EYE SURGERY Bilateral "several"  . INCISION AND DRAINAGE PERIRECTAL ABSCESS  04/03/2012  . IR US GUIDE VASC ACCESS LEFT  02/13/2018  . IR VENIPUNCTURE 80YRS/OLDER BY MD  02/13/2018  . POLYPECTOMY  02/13/2018   Procedure: POLYPECTOMY;  Surgeon: Ladene Artist, MD;  Location: [facility];  Service: Endoscopy;;     Current Outpatient Medications  Medication Sig Dispense Refill  . ACCU-CHEK SOFTCLIX LANCETS lancets Use as instructed for 3 times daily  blood glucose monitoring 100 each 5  . acetaminophen (TYLENOL) 500 MG tablet Take 1 tablet (500 mg total) by mouth every 6 (six) hours as needed. (Patient taking differently: Take 500 mg by mouth every 6 (six) hours as needed (pain.). ) 30 tablet 0  . acetaZOLAMIDE (DIAMOX) 500 MG capsule Take 500 mg by mouth 2 (two) times daily.    Marland Kitchen aspirin EC 81 MG tablet Take 81 mg by mouth daily.    Marland Kitchen atorvastatin (LIPITOR) 80 MG tablet TAKE 1 TABLET BY MOUTH ONCE DAILY IN THE EVENING AT 6PM 90 tablet 0  . baclofen (LIORESAL) 10 MG tablet Take 1 tablet (10 mg total) by mouth at bedtime as needed for muscle spasms. (Patient taking differently: Take 10 mg by  mouth daily as needed for muscle spasms. ) 30 each 5  . Blood Glucose Monitoring Suppl (ACCU-CHEK AVIVA PLUS) w/Device KIT Used as directed 1 kit 0  . Blood Pressure Monitoring (BLOOD PRESSURE MONITOR/ARM) DEVI 1 each by Does not apply route daily. ICD 10, I10 and I50.32 1 Device 0  . brimonidine-timolol (COMBIGAN) 0.2-0.5 % ophthalmic solution Place 1 drop into both eyes 2 (two) times daily.     . calcium carbonate (TUMS - DOSED IN MG ELEMENTAL CALCIUM) 500 MG chewable tablet Chew 1 tablet by mouth 3 (three) times daily as needed for indigestion.     . Cyanocobalamin (VITAMIN B-12) 1000 MCG TABS Take 1,000 mcg by mouth daily. 90 tablet 1  . diltiazem (DILACOR XR) 240 MG 24 hr capsule Take 1 capsule (240 mg total) by mouth daily. 90 capsule 3  . docusate sodium (COLACE) 100 MG capsule Take 1 capsule (100 mg total) by mouth 2 (two) times daily. (Patient taking differently: Take 100 mg by mouth 2 (two) times daily as needed (CONSTIPATION). ) 60 capsule 3  . ferrous sulfate 325 (65 FE) MG tablet Take 325 mg by mouth daily with breakfast.    . furosemide (LASIX) 40 MG tablet Take 1 tablet (40 mg total) by mouth 2 (two) times daily. 180 tablet 3  . furosemide (LASIX) 80 MG tablet Take one 40m tablet by mouth with furosemide 461mfor a total dose of 12036mID (Patient taking differently: Take 80 mg by mouth 2 (two) times daily. Take one 6m7mblet by mouth with furosemide 40mg50m a total dose of 120mg 84m 180 tablet 1  . glucose blood test strip Use TID before meals / Dx E11.9 100 each 12  . glucose monitoring kit (FREESTYLE) monitoring kit 1 each by Does not apply route 4 (four) times daily - after meals and at bedtime. 1 each 1  . insulin lispro protamine-lispro (HUMALOG MIX 75/25) (75-25) 100 UNIT/ML SUSP injection INJECT 50 UNITS INTO THE SKIN 2 TIMES DAILY WITH A MEAL (Patient taking differently: Inject 50 Units into the skin 2 (two) times daily. INJECT 50 UNITS INTO THE SKIN 2 TIMES DAILY WITH A  MEAL) 40 mL 11  . isosorbide mononitrate (IMDUR) 120 MG 24 hr tablet TAKE 1 TABLET BY MOUTH ONCE DAILY 90 tablet 0  . losartan (COZAAR) 50 MG tablet Take 1.5 tablets (75 mg total) by mouth daily. 45 tablet 5  . LUMIGAN 0.01 % SOLN Place 1 drop into the right eye at bedtime.     . Menthol-Methyl Salicylate (MUSCLE RUB EX) Apply 1 application topically daily as needed (knee pain).     . metoMarland KitchenLOPramide (REGLAN) 5 MG tablet TAKE 1 TABLET BY MOUTH TWICE DAILY BEFORE  THE  TWO  LARGE  MEALS  OF  THE  DAY 60 tablet 2  . metolazone (ZAROXOLYN) 2.5 MG tablet 1 tab once every two weeks PRN swelling lower extremities (Patient taking differently: Take 2.5 mg by mouth See admin instructions. TAKE 1 TABLET (2.5 MG) BY MOUTH EVERY 14 DAYS AS NEEDED FOR LEG/FOOT SWELLING) 10 tablet 3  . metoprolol (TOPROL-XL) 200 MG 24 hr tablet TAKE 1 TABLET BY MOUTH ONCE DAILY (Patient taking differently: Take 200 mg by mouth daily. ) 90 tablet 2  . misoprostol (CYTOTEC) 200 MCG tablet Take one tablet the night before procedure and the one tablet the morning off procedure 2 tablet 0  . pantoprazole (PROTONIX) 40 MG tablet TAKE ONE TABLET BY MOUTH ONCE DAILY (Patient taking differently: Take 40 mg by mouth daily. ) 30 tablet 2  . potassium chloride SA (K-DUR,KLOR-CON) 20 MEQ tablet 2 tabs PO in a.m and 1 tab PO Q p.m 90 tablet 3  . RELION INSULIN SYRINGE 31G X 15/64" 1 ML MISC USE AS DIRECTED TWICE DAILY INSULIN ADMINISTRATION 100 each 12  . levonorgestrel (MIRENA) 20 MCG/24HR IUD 1 Intra Uterine Device (1 each total) by Intrauterine route once for 1 dose. 1 each 0  . nitroGLYCERIN (NITROSTAT) 0.4 MG SL tablet DISSOLVE ONE TABLET UNDER THE TONGUE EVERY 5 MINUTES AS NEEDED FOR CHEST PAIN.  DO NOT EXCEED A TOTAL OF 3 DOSES IN 15 MINUTES NOW (Patient not taking: No sig reported) 25 tablet 3   No current facility-administered medications for this visit.     Allergies:   Ativan [lorazepam] and Orange fruit [citrus]    Social  History:  The patient  reports that she has never smoked. She has never used smokeless tobacco. She reports that she does not drink alcohol or use drugs.   Family History:  The patient's family history includes Breast cancer in her cousin and mother; Colon cancer in her cousin; Colon polyps in her father; Diabetes in her brother, father, maternal grandmother, mother, and sister; Heart attack in her father and mother; Heart disease in her brother, father, mother, and sister; Hypertension in her brother, father, maternal grandmother, mother, and sister; Stroke in her father.    ROS: All other systems are reviewed and negative. Unless otherwise mentioned in H&P    PHYSICAL EXAM: VS:  LMP 04/02/2012  , BMI There is no height or weight on file to calculate BMI. GEN: Well nourished, well developed, in no acute distress. Morbidly obese.  HEENT: normal Neck: no JVD, carotid bruits, or masses Cardiac: RRR;, distant heart sounds,  no murmurs, rubs, or gallops,no edema  Respiratory:  Clear to auscultation bilaterally, normal work of breathing GI: soft, nontender, nondistended, + BS MS: no deformity or atrophy Skin: warm and dry, no rash Neuro:  Strength and sensation are intact Psych: euthymic mood, full affect   EKG: NSR with RBBB. Rate of 72 bpm.   Recent Labs: 08/10/2018: ALT 13; BUN 13; Creatinine, Ser 1.15; Hemoglobin 12.6; Platelets 286; Potassium 3.1; Sodium 139    Lipid Panel    Component Value Date/Time   CHOL 107 12/20/2017 0949   TRIG 86 12/20/2017 0949   HDL 35 (L) 12/20/2017 0949   CHOLHDL 3.1 12/20/2017 0949   CHOLHDL 2.9 09/12/2015 1147   VLDL 12 09/12/2015 1147   LDLCALC 55 12/20/2017 0949      Wt Readings from Last 3 Encounters:  08/11/18 284 lb (128.8 kg)  08/10/18 281 lb 8.4 oz (127.7 kg)  07/10/18 282 lb  9.6 oz (128.2 kg)     Other studies Reviewed: Echocardiogram 2016/02/18 Left ventricle: The cavity size was normal. Wall thickness was   normal. Systolic  function was normal. The estimated ejection   fraction was in the range of 55% to 60%. Wall motion was normal;   there were no regional wall motion abnormalities. - Left atrium: The atrium was mildly dilated.  ASSESSMENT AND PLAN:  1. Pre-Operative Cardiac Evaluation:  Chart reviewed as part of pre-operative protocol coverage. Given past medical history and time since last visit, based on ACC/AHA guidelines, Jamiria M Axtell would be at acceptable but moderate risk for the planned procedure without further cardiovascular testing. Ok to hold ASA at the discretion of the surgeon pre-operatively, but do not hold metoprolol.   2. CAD: Diffuse but is a poor surgical candidate or for PCI. She is treated medically with secondary risk factor control, Continue BB peri-operatively. She is currently asymptomatic.   3. Hypertension:  Continue diltiazem, BP is slightly elevated today. No changes at this time.   4. Chronic Diastolic CHF: Continue diuretics. She has not had to use metolazone for worsening edema not controlled on lasix.   5. Diarrhea: She drinks a lot of Mt. Dew and Pepsi. I have asked her to stop the [place]. Dew and cut down on Pepsi to help with the diarrhea.     Current medicines are reviewed at length with the patient today.    Labs/ tests ordered today include: None  Phill Myron. [place] Pugh, ANP, [place]   09/04/2018 8:40 AM    [address] Office [phone] Fax [phone]

## 2018-09-04 ENCOUNTER — Encounter: Payer: Self-pay | Admitting: Adult Health

## 2018-09-04 ENCOUNTER — Ambulatory Visit: Payer: Medicare Other | Admitting: Adult Health

## 2018-09-04 VITALS — BP 150/84 | HR 72

## 2018-09-04 DIAGNOSIS — E669 Obesity, unspecified: Secondary | ICD-10-CM

## 2018-09-04 DIAGNOSIS — Z01810 Encounter for preprocedural cardiovascular examination: Secondary | ICD-10-CM | POA: Diagnosis not present

## 2018-09-04 DIAGNOSIS — R269 Unspecified abnormalities of gait and mobility: Secondary | ICD-10-CM | POA: Diagnosis not present

## 2018-09-04 DIAGNOSIS — I10 Essential (primary) hypertension: Secondary | ICD-10-CM

## 2018-09-04 DIAGNOSIS — I25118 Atherosclerotic heart disease of native coronary artery with other forms of angina pectoris: Secondary | ICD-10-CM | POA: Diagnosis not present

## 2018-09-04 DIAGNOSIS — M170 Bilateral primary osteoarthritis of knee: Secondary | ICD-10-CM | POA: Diagnosis not present

## 2018-09-04 DIAGNOSIS — Z9181 History of falling: Secondary | ICD-10-CM | POA: Diagnosis not present

## 2018-09-04 DIAGNOSIS — I5032 Chronic diastolic (congestive) heart failure: Secondary | ICD-10-CM

## 2018-09-04 DIAGNOSIS — H548 Legal blindness, as defined in USA: Secondary | ICD-10-CM | POA: Diagnosis not present

## 2018-09-04 DIAGNOSIS — R079 Chest pain, unspecified: Secondary | ICD-10-CM | POA: Diagnosis not present

## 2018-09-04 NOTE — Progress Notes (Signed)
I agree she is moderate risk. No intervention that we can implement to reduce that risk , though. Please make sure her beta blockers are not interrupted around the time of surgery. Thanks [employer]

## 2018-09-04 NOTE — Patient Instructions (Signed)
CLEARED FOR SCHEDULED PROCEDURE Follow-Up: You will need a follow up appointment in [place], MD or one of the following Advanced Practice Providers on your designated Care Team:  Almyra Deforest, PA-C  Fabian Sharp, [place]    Medication Instructions:  NO CHANGES- Your physician recommends that you continue on your current medications as directed. Please refer to the Current Medication list given to you today. If you need a refill on your cardiac medications before your next appointment, please call your pharmacy. Labwork: When you have labs (blood work) and your tests are completely normal, you will receive your results ONLY by [place] (if you have MyChart) -OR- A paper copy in the mail.  At [facility], you and your health needs are our priority.  As part of our continuing mission to provide you with exceptional heart care, we have created designated Provider Care Teams.  These Care Teams include your primary Cardiologist (physician) and Advanced Practice Providers (APPs -  Physician Assistants and Nurse Practitioners) who all work together to provide you with the care you need, when you need it.  Thank you for choosing [facility]!!

## 2018-09-04 NOTE — Progress Notes (Signed)
Thank you :)

## 2018-09-06 ENCOUNTER — Ambulatory Visit: Payer: Medicare Other | Admitting: Family Medicine

## 2018-09-19 ENCOUNTER — Ambulatory Visit (INDEPENDENT_AMBULATORY_CARE_PROVIDER_SITE_OTHER): Payer: Medicare Other | Admitting: Family Medicine

## 2018-09-19 ENCOUNTER — Ambulatory Visit: Payer: Medicare Other | Admitting: Family Medicine

## 2018-09-19 ENCOUNTER — Encounter: Payer: Self-pay | Admitting: Family Medicine

## 2018-09-19 ENCOUNTER — Ambulatory Visit: Payer: Medicare Other

## 2018-09-19 VITALS — BP 165/72 | HR 72 | Wt 284.6 lb

## 2018-09-19 DIAGNOSIS — N950 Postmenopausal bleeding: Secondary | ICD-10-CM | POA: Diagnosis not present

## 2018-09-19 NOTE — Patient Instructions (Signed)
Hysteroscopy  Hysteroscopy is a procedure that is used to examine the inside of a woman's womb (uterus). This may be done for various reasons, including:   To look for lumps (tumors) and other growths in the uterus.   To evaluate abnormal bleeding, fibroid tumors, polyps, scar tissue (adhesions), or cancer of the uterus.   To determine the cause of an inability to get pregnant (infertility) or repeated losses of pregnancies (miscarriages).   To find a lost IUD (intrauterine device).   To perform a procedure that permanently prevents pregnancy (sterilization).  During this procedure, a thin, flexible tube with a small light and camera (hysteroscope) is used to examine the uterus. The camera sends images to a monitor in the room so that your health care provider can view the inside of your uterus. A hysteroscopy should be done right after a menstrual period to make sure that you are not pregnant.  Tell a health care provider about:   Any allergies you have.   All medicines you are taking, including vitamins, herbs, eye drops, creams, and over-the-counter medicines.   Any problems you or family members have had with the use of anesthetic medicines.   Any blood disorders you have.   Any surgeries you have had.   Any medical conditions you have.   Whether you are pregnant or may be pregnant.  What are the risks?  Generally, this is a safe procedure. However, problems may occur, including:   Excessive bleeding.   Infection.   Damage to the uterus or other structures or organs.   Allergic reaction to medicines or fluids that are used in the procedure.  What happens before the procedure?  Staying hydrated  Follow instructions from your health care provider about hydration, which may include:   Up to 2 hours before the procedure - you may continue to drink clear liquids, such as water, clear fruit juice, black coffee, and plain tea.  Eating and drinking restrictions  Follow instructions from your health care  provider about eating and drinking, which may include:   8 hours before the procedure - stop eating solid foods and drink clear liquids only   2 hours before the procedure - stop drinking clear liquids.  General instructions   Ask your health care provider about:  ? Changing or stopping your normal medicines. This is important if you take diabetes medicines or blood thinners.  ? Taking medicines such as aspirin and ibuprofen. These medicines can thin your blood and cause bleeding. Do not take these medicines for 1 week before your procedure, or as told by your health care provider.   Do not use any products that contain nicotine or tobacco for 2 weeks before the procedure. This includes cigarettes and e-cigarettes. If you need help quitting, ask your health care provider.   Medicine may be placed in your cervix the day before the procedure. This medicine causes the cervix to have a larger opening (dilate). The larger opening makes it easier for the hysteroscope to be inserted into the uterus during the procedure.   Plan to have someone with you for the first 24-48 hours after the procedure, especially if you are given a medicine to make you fall asleep (general anesthetic).   Plan to have someone take you home from the hospital or clinic.  What happens during the procedure?   To lower your risk of infection:  ? Your health care team will wash or sanitize their hands.  ? Your skin will   be washed with soap.  ? Hair may be removed from the surgical area.   An IV tube will be inserted into one of your veins.   You may be given one or more of the following:  ? A medicine to help you relax (sedative).  ? A medicine that numbs the area around the cervix (local anesthetic).  ? A medicine to make you fall asleep (general anesthetic).   A hysteroscope will be inserted through your vagina and into your uterus.   Air or fluid will be used to enlarge your uterus, enabling your health care provider to see your uterus  better. The amount of fluid used will be carefully checked throughout the procedure.   In some cases, tissue may be gently scraped from inside the uterus and sent to a lab for testing (biopsy).  The procedure may vary among health care providers and hospitals.  What happens after the procedure?   Your blood pressure, heart rate, breathing rate, and blood oxygen level will be monitored until the medicines you were given have worn off.   You may have some cramping. You may be given medicines for this.   You may have bleeding, which varies from light spotting to menstrual-like bleeding. This is normal.   If you had a biopsy done, it is your responsibility to get the results of your procedure. Ask your health care provider, or the department performing the procedure, when your results will be ready.  Summary   Hysteroscopy is a procedure that is used to examine the inside of a woman's womb (uterus).   After the procedure, you may have bleeding, which varies from light spotting to menstrual-like bleeding. This is normal. You may also have cramping.   Plan to have someone take you home from the hospital or clinic.  This information is not intended to replace advice given to you by your health care provider. Make sure you discuss any questions you have with your health care provider.  Document Released: 10/25/2000 Document Revised: 08/17/2016 Document Reviewed: 08/17/2016  Elsevier Interactive Patient Education  2019 [employer]vier Inc[employer]

## 2018-09-19 NOTE — Assessment & Plan Note (Signed)
For D and C with Hysteroscopy and Placement of IUD, possible polyp removal under anesthesia. Has seen cards with clearance, though she is a poor candidate.

## 2018-09-19 NOTE — Progress Notes (Signed)
   Subjective:    Patient ID: [name] is a 63 y.o. female presenting with Endometrial biopsy and IUD placement  on 09/19/2018  HPI: Here to f/u for PMB. Failed office EMB in past. Given poor surgical risk, and 2.0 cm thickened endometrium, prior sampling, decision to attempt in office sampling with IUD placement, following cytotec today, to potentially avoid surgery. Took one dose of cytotec last pm, could not tolerate second dose this am, due to side effects of nausea, dizziness. Has been unable to take any meds today.  Review of Systems  Constitutional: Negative for chills and fever.  Respiratory: Negative for shortness of breath.   Cardiovascular: Negative for chest pain.  Gastrointestinal: Positive for nausea. Negative for abdominal pain and vomiting.  Genitourinary: Negative for dysuria.  Skin: Negative for rash.  Neurological: Positive for dizziness.      Objective:    BP (!) 165/72   Pulse 72   Wt 284 lb 9.6 oz (129.1 kg)   LMP 04/02/2012   BMI 50.41 kg/m  Physical Exam Constitutional:      General: She is not in acute distress.    Appearance: She is well-developed. She is obese. She is ill-appearing.  HENT:     Head: Normocephalic and atraumatic.  Eyes:     General: No scleral icterus. Neck:     Musculoskeletal: Neck supple.  Cardiovascular:     Rate and Rhythm: Normal rate.  Pulmonary:     Effort: Pulmonary effort is normal.  Abdominal:     Palpations: Abdomen is soft.  Genitourinary:    General: Normal vulva.     Comments: Cervix appears nulliparous Skin:    General: Skin is warm and dry.  Neurological:     Mental Status: She is alert and oriented to person, place, and time.    Procedure: Patient given informed consent, signed copy in the chart, time out was performed. Appropriate time out taken. . The patient was placed in the lithotomy position and the cervix brought into view with sterile speculum.  Portio of cervix cleansed x 2 with betadine  swabs.  A tenaculum was placed in the anterior lip of the cervix.  The uterus was sounded for depth of 6 cm. A pipelle was introduced to into the uterus, suction created, and an sample was obtained. It is not clear that the uterine cavity was entered. Os finder would pass, but pipelle against fibroid or something and would not advance.  All equipment was removed and accounted for.  The patient tolerated the procedure well.     Assessment & Plan:   Problem List Items Addressed This Visit      Unprioritized   Post-menopausal bleeding - Primary    For D and C with Hysteroscopy and Placement of IUD, possible polyp removal under anesthesia. Has seen cards with clearance, though she is a poor candidate.          Total face-to-face time with patient: 10 minutes. Over 50% of encounter was spent on counseling and coordination of care. Return in about 4 weeks (around 10/17/2018) for postop check.  [name] 09/19/2018 2:26 PM

## 2018-09-29 ENCOUNTER — Encounter: Payer: Self-pay | Admitting: Internal Medicine

## 2018-09-29 ENCOUNTER — Ambulatory Visit: Payer: Medicare Other | Attending: Internal Medicine | Admitting: Internal Medicine

## 2018-09-29 VITALS — BP 155/82 | HR 65 | Temp 98.2°F | Resp 16 | Wt 283.8 lb

## 2018-09-29 DIAGNOSIS — Z6841 Body Mass Index (BMI) 40.0 and over, adult: Secondary | ICD-10-CM

## 2018-09-29 DIAGNOSIS — N950 Postmenopausal bleeding: Secondary | ICD-10-CM

## 2018-09-29 DIAGNOSIS — I10 Essential (primary) hypertension: Secondary | ICD-10-CM

## 2018-09-29 DIAGNOSIS — E118 Type 2 diabetes mellitus with unspecified complications: Secondary | ICD-10-CM

## 2018-09-29 DIAGNOSIS — Z794 Long term (current) use of insulin: Secondary | ICD-10-CM

## 2018-09-29 DIAGNOSIS — E113593 Type 2 diabetes mellitus with proliferative diabetic retinopathy without macular edema, bilateral: Secondary | ICD-10-CM

## 2018-09-29 DIAGNOSIS — E669 Obesity, unspecified: Secondary | ICD-10-CM

## 2018-09-29 LAB — GLUCOSE, POCT (MANUAL RESULT ENTRY): POC Glucose: 119 mg/dl — AB (ref 70–99)

## 2018-09-29 MED ORDER — ACCU-CHEK AVIVA PLUS W/DEVICE KIT: PACK | 0 refills | Status: AC

## 2018-09-29 MED ORDER — ACCU-CHEK SOFTCLIX LANCETS MISC: 5 refills | Status: DC

## 2018-09-29 MED ORDER — LOSARTAN POTASSIUM 50 MG PO TABS: 100.0000 mg | ORAL_TABLET | Freq: Every day | ORAL | 5 refills | Status: DC

## 2018-09-29 MED ORDER — GLUCOSE BLOOD VI STRP: ORAL_STRIP | 12 refills | Status: DC

## 2018-09-29 NOTE — Progress Notes (Signed)
Patient ID: [name], female    DOB: 1956/07/28  MRN: [id number]  CC: Diabetes and Hypertension   Subjective: [name] is a 63 y.o. female who presents for chronic ds management.  Her concerns today include:  Pt with hx of DM with probable gastroparesis,retinopathy,HTN, HL, CAD, chronic diastolic CHF, obesity,IDA, OA knees, Vit B 12 def  Post-menopausal bleeding:  Schedule for D&C with hysteroscopy and IUD placement on 10/11/2018.  Saw cardiology for pre-eval.  Moderate risk.  Told to cont B-blocker perioperatively.  HYPERTENSION/CAD/CHF Currently taking: see medication list Med Adherence: '[x]'  Yes.  Cozaar increased on last visit to 75 mg daily.  She has been taking 1-1/2 of the 50 mg tablets.  I had also increased potassium on last visit to  Two 20 mg tablets in the morning and 1 in the evening.  However patient states she has been doing 1-1/2 in the morning and 1-1/2 in the evening.  Of note she is on K-dur which should not be broken in half Medication side effects: '[]'  Yes    '[x]'  No Adherence with salt restriction: '[x]'  Yes    '[]'  No Home Monitoring?: '[]'  Yes    '[x]'  No Monitoring Frequency: '[]'  Yes    '[]'  No Home BP results range: '[]'  Yes    '[]'  No SOB? '[]'  Yes    '[x]'  No Chest Pain?: '[]'  Yes    '[x]'  No Leg swelling?: '[x]'  Yes    '[]'  No Headaches?: '[]'  Yes    '[]'  No Dizziness? '[x]'  Yes "since I've been having this heavy vaginal bleeding since the end of January."  Passing clot about twice a day.  Taking iron supplement once a day as prescribed.  HA, and back aches with the bleeding also.[name] Midol for the cramps Comments:   Had diarrhea last mth for 4 days. Went away with Ginger Ale and Pepto Bismol.  Had some diarrhea yesterday. No cramps, fever.  Resolved after 1 dose of Pepto-Bismol.  DM: compliant with Humalog 75/25 50 units BID Miss placed her machine.  Requests prescription for a new one Eating Habits: Puts sugar in her tea.  Drinks a Ginger Ale daily.  Loves fruits and  crackers as snack Low BS about 1-2 x a mth.  Can tell when BS low Takes Reglan BID.  "I have to take something before I eat otherwise I'll be spitting up like a baby." Last eye exam 08/03/2018 Has retinopathy BL  HM:  Declines BMD and Shingrix Patient Active Problem List   Diagnosis Date Noted  . Legally blind in right eye, as defined in [place] 08/11/2018  . Bilateral carpal tunnel syndrome 04/13/2018  . Adenomatous polyp of colon 04/13/2018  . Fibrocystic breast changes, right 12/12/2017  . Drug-induced constipation 09/15/2017  . Primary osteoarthritis of both knees 04/05/2017  . Chronic bilateral low back pain 03/07/2017  . Chronic pain of right knee 03/07/2017  . Vitamin B12 deficiency 12/09/2016  . Incidental lung nodule, > 45m and < 878m02/13/2018  . Vitreous hemorrhage of right eye ([facility]  . Diabetic gastroparesis associated with type 2 diabetes mellitus ([facility]02/05/2016  . Controlled type 2 diabetes mellitus with complication, with long-term current use of insulin ([facility] [place]02/05/2016  . Chronic renal insufficiency, stage III (moderate) ([facility]08/17/2016  . Prolonged Q-T interval on ECG 08/10/2013  . Dyslipidemia 04/02/2013  . Chronic diastolic heart failure, NYHA class 3 ([facility]09/01/2012  . Presumed OSA (obstructive sleep apnea) 04/06/2012  . NSTEMI  post-op 04/04/12-medical Rx 04/04/2012  . Super obesity 01/17/2012  . GERD 08/21/2009  . Post-menopausal bleeding 03/20/2009  . PERIPHERAL EDEMA 03/20/2009  . Coronary atherosclerosis-not a surgical or PCI candidate 2010 02/27/2009  . Proliferative diabetic retinopathy ([place]) 10/07/2007  . DETACHED RETINA, BILATERAL, HX OF 09/07/2007  . History of chronic Iron defeicency anemia with acute post op anemia, transfused after debridment 04/04/12 04/17/2007  . Essential hypertension 04/17/2007     Current Outpatient Medications on File Prior to Visit  Medication Sig Dispense Refill  . ACCU-CHEK SOFTCLIX LANCETS lancets Use as instructed for 3  times daily blood glucose monitoring (Patient not taking: Reported on 09/19/2018) 100 each 5  . acetaminophen (TYLENOL) 500 MG tablet Take 1 tablet (500 mg total) by mouth every 6 (six) hours as needed. (Patient taking differently: Take 500 mg by mouth every 6 (six) hours as needed (pain.). ) 30 tablet 0  . acetaZOLAMIDE (DIAMOX) 500 MG capsule Take 500 mg by mouth 2 (two) times daily.    Marland Kitchen aspirin EC 81 MG tablet Take 81 mg by mouth daily.    Marland Kitchen atorvastatin (LIPITOR) 80 MG tablet TAKE 1 TABLET BY MOUTH ONCE DAILY IN THE EVENING AT 6PM 90 tablet 0  . baclofen (LIORESAL) 10 MG tablet Take 1 tablet (10 mg total) by mouth at bedtime as needed for muscle spasms. (Patient taking differently: Take 10 mg by mouth daily as needed for muscle spasms. ) 30 each 5  . Blood Glucose Monitoring Suppl (ACCU-CHEK AVIVA PLUS) w/Device KIT Used as directed (Patient not taking: Reported on 09/19/2018) 1 kit 0  . Blood Pressure Monitoring (BLOOD PRESSURE MONITOR/ARM) DEVI 1 each by Does not apply route daily. ICD 10, I10 and I50.32 (Patient not taking: Reported on 09/19/2018) 1 Device 0  . brimonidine-timolol (COMBIGAN) 0.2-0.5 % ophthalmic solution Place 1 drop into both eyes 2 (two) times daily.     . calcium carbonate (TUMS - DOSED IN MG ELEMENTAL CALCIUM) 500 MG chewable tablet Chew 1 tablet by mouth 3 (three) times daily as needed for indigestion.     . Cyanocobalamin (VITAMIN B-12) 1000 MCG TABS Take 1,000 mcg by mouth daily. 90 tablet 1  . diltiazem (DILACOR XR) 240 MG 24 hr capsule Take 1 capsule (240 mg total) by mouth daily. 90 capsule 3  . docusate sodium (COLACE) 100 MG capsule Take 1 capsule (100 mg total) by mouth 2 (two) times daily. (Patient taking differently: Take 100 mg by mouth 2 (two) times daily as needed (CONSTIPATION). ) 60 capsule 3  . ferrous sulfate 325 (65 FE) MG tablet Take 325 mg by mouth daily with breakfast.    . furosemide (LASIX) 40 MG tablet Take 1 tablet (40 mg total) by mouth 2 (two) times  daily. 180 tablet 3  . furosemide (LASIX) 80 MG tablet Take one 33m tablet by mouth with furosemide 446mfor a total dose of 12026mID (Patient taking differently: Take 80 mg by mouth 2 (two) times daily. Take one 24m74mblet by mouth with furosemide 40mg36m a total dose of 120mg 6m 180 tablet 1  . glucose blood test strip Use TID before meals / Dx E11.9 (Patient not taking: Reported on 09/19/2018) 100 each 12  . glucose monitoring kit (FREESTYLE) monitoring kit 1 each by Does not apply route 4 (four) times daily - after meals and at bedtime. (Patient not taking: Reported on 09/19/2018) 1 each 1  . insulin lispro protamine-lispro (HUMALOG MIX 75/25) (75-25) 100 UNIT/ML SUSP injection INJECT  50 UNITS INTO THE SKIN 2 TIMES DAILY WITH A MEAL (Patient taking differently: Inject 50 Units into the skin 2 (two) times daily. INJECT 50 UNITS INTO THE SKIN 2 TIMES DAILY WITH A MEAL) 40 mL 11  . isosorbide mononitrate (IMDUR) 120 MG 24 hr tablet TAKE 1 TABLET BY MOUTH ONCE DAILY 90 tablet 0  . levonorgestrel (MIRENA) 20 MCG/24HR IUD 1 Intra Uterine Device (1 each total) by Intrauterine route once for 1 dose. 1 each 0  . losartan (COZAAR) 50 MG tablet Take 1.5 tablets (75 mg total) by mouth daily. 45 tablet 5  . LUMIGAN 0.01 % SOLN Place 1 drop into the right eye at bedtime.     . Menthol-Methyl Salicylate (MUSCLE RUB EX) Apply 1 application topically daily as needed (knee pain).     Marland Kitchen metoCLOPramide (REGLAN) 5 MG tablet TAKE 1 TABLET BY MOUTH TWICE DAILY BEFORE  THE  TWO  LARGE  MEALS  OF  THE  DAY 60 tablet 2  . metolazone (ZAROXOLYN) 2.5 MG tablet 1 tab once every two weeks PRN swelling lower extremities (Patient not taking: Reported on 09/19/2018) 10 tablet 3  . metoprolol (TOPROL-XL) 200 MG 24 hr tablet TAKE 1 TABLET BY MOUTH ONCE DAILY (Patient taking differently: Take 200 mg by mouth daily. ) 90 tablet 2  . misoprostol (CYTOTEC) 200 MCG tablet Take one tablet the night before procedure and the one tablet  the morning off procedure 2 tablet 0  . nitroGLYCERIN (NITROSTAT) 0.4 MG SL tablet DISSOLVE ONE TABLET UNDER THE TONGUE EVERY 5 MINUTES AS NEEDED FOR CHEST PAIN.  DO NOT EXCEED A TOTAL OF 3 DOSES IN 15 MINUTES NOW (Patient not taking: No sig reported) 25 tablet 3  . pantoprazole (PROTONIX) 40 MG tablet TAKE ONE TABLET BY MOUTH ONCE DAILY (Patient taking differently: Take 40 mg by mouth daily. ) 30 tablet 2  . potassium chloride SA (K-DUR,KLOR-CON) 20 MEQ tablet 2 tabs PO in a.m and 1 tab PO Q p.m 90 tablet 3  . RELION INSULIN SYRINGE 31G X 15/64" 1 ML MISC USE AS DIRECTED TWICE DAILY INSULIN ADMINISTRATION 100 each 12   No current facility-administered medications on file prior to visit.     Allergies  Allergen Reactions  . Ativan [Lorazepam] Anaphylaxis  . Orange Fruit [Citrus] Anaphylaxis and Other (See Comments)    Pt stated throat swelling, itching    Social History   Socioeconomic History  . Marital status: Married    Spouse name: Not on file  . Number of children: 0  . Years of education: Not on file  . Highest education level: Not on file  Occupational History  . Occupation: disabled  Social Needs  . Financial resource strain: Not on file  . Food insecurity:    Worry: Not on file    Inability: Not on file  . Transportation needs:    Medical: Not on file    Non-medical: Not on file  Tobacco Use  . Smoking status: Never Smoker  . Smokeless tobacco: Never Used  Substance and Sexual Activity  . Alcohol use: No  . Drug use: Never  . Sexual activity: Yes  Lifestyle  . Physical activity:    Days per week: Not on file    Minutes per session: Not on file  . Stress: Not on file  Relationships  . Social connections:    Talks on phone: Not on file    Gets together: Not on file    Attends religious  service: Not on file    Active member of club or organization: Not on file    Attends meetings of clubs or organizations: Not on file    Relationship status: Not on file  .  Intimate partner violence:    Fear of current or ex partner: Not on file    Emotionally abused: Not on file    Physically abused: Not on file    Forced sexual activity: Not on file  Other Topics Concern  . Not on file  Social History Narrative  . Not on file    Family History  Problem Relation Age of Onset  . Diabetes Mother   . Heart disease Mother   . Heart attack Mother   . Hypertension Mother   . Breast cancer Mother   . Colon polyps Father   . Diabetes Father   . Heart disease Father   . Heart attack Father   . Stroke Father   . Hypertension Father   . Diabetes Brother   . Diabetes Sister   . Heart disease Brother   . Heart disease Sister   . Diabetes Maternal Grandmother   . Hypertension Maternal Grandmother   . Hypertension Brother   . Hypertension Sister   . Breast cancer Cousin   . Colon cancer Cousin   . Esophageal cancer Neg Hx   . Rectal cancer Neg Hx   . Stomach cancer Neg Hx     Past Surgical History:  Procedure Laterality Date  . BIOPSY  02/13/2018   Procedure: BIOPSY;  Surgeon: Ladene Artist, MD;  Location: [facility];  Service: Endoscopy;;  . BREAST SURGERY     right breast biopsy  . CARDIAC CATHETERIZATION  July 2010  . CATARACT EXTRACTION, BILATERAL Bilateral   . COLONOSCOPY WITH PROPOFOL N/A 02/13/2018   Procedure: COLONOSCOPY WITH PROPOFOL;  Surgeon: Ladene Artist, MD;  Location: [facility];  Service: Endoscopy;  Laterality: N/A;  . DILATION AND CURETTAGE OF UTERUS    . EYE SURGERY Bilateral "several"  . INCISION AND DRAINAGE PERIRECTAL ABSCESS  04/03/2012  . IR US GUIDE VASC ACCESS LEFT  02/13/2018  . IR VENIPUNCTURE 54YRS/OLDER BY MD  02/13/2018  . POLYPECTOMY  02/13/2018   Procedure: POLYPECTOMY;  Surgeon: Ladene Artist, MD;  Location: [facility];  Service: Endoscopy;;    ROS: Review of Systems Negative except as stated above  PHYSICAL EXAM: BP (!) 155/82   Pulse 65   Temp 98.2 F (36.8 C) (Oral)   Resp 16   Wt 283 lb  12.8 oz (128.7 kg)   LMP 04/02/2012   SpO2 99%   BMI 50.27 kg/m   Physical Exam BP 150/80 General appearance - alert, well appearing, morbidly obese female and in no distress.patient is unable to get up on the exam table even with my assistance. Mental status - normal mood, behavior, speech, dress, motor activity, and thought processes Eyes - pupils equal and reactive, extraocular eye movements intact Mouth - mucous membranes moist, pharynx normal without lesions Neck - supple, no significant adenopathy Chest - clear to auscultation, no wheezes, rales or rhonchi, symmetric air entry Heart - normal rate, regular rhythm, normal S1, S2, no murmurs, rubs, clicks or gallops Extremities -trace lower extremity edema  Results for orders placed or performed in visit on 09/29/18  POCT glucose (manual entry)  Result Value Ref Range   POC Glucose 119 (A) 70 - 99 mg/dl   Lab Results  Component Value Date   HGBA1C 6.7 (  H) 08/10/2018   CMP Latest Ref Rng & Units 08/10/2018 12/20/2017 09/15/2017  Glucose 70 - 99 mg/dL 182(H) 190(H) 133(H)  BUN 8 - 23 mg/dL '13 14 15  ' Creatinine 0.44 - 1.00 mg/dL 1.15(H) 1.20(H) 1.31(H)  Sodium 135 - 145 mmol/L 139 140 141  Potassium 3.5 - 5.1 mmol/L 3.1(L) 3.5 3.4(L)  Chloride 98 - 111 mmol/L 108 104 104  CO2 22 - 32 mmol/L '22 22 20  ' Calcium 8.9 - 10.3 mg/dL 9.4 9.2 9.0  Total Protein 6.5 - 8.1 g/dL 7.4 6.9 7.0  Total Bilirubin 0.3 - 1.2 mg/dL 0.5 0.4 0.3  Alkaline Phos 38 - 126 U/L 49 60 62  AST 15 - 41 U/L '15 11 9  ' ALT 0 - 44 U/L '13 8 11   ' Lipid Panel     Component Value Date/Time   CHOL 107 12/20/2017 0949   TRIG 86 12/20/2017 0949   HDL 35 (L) 12/20/2017 0949   CHOLHDL 3.1 12/20/2017 0949   CHOLHDL 2.9 09/12/2015 1147   VLDL 12 09/12/2015 1147   LDLCALC 55 12/20/2017 0949    CBC    Component Value Date/Time   WBC 10.6 (H) 08/10/2018 0925   RBC 4.22 08/10/2018 0925   HGB 12.6 08/10/2018 0925   HGB 12.8 12/20/2017 0949   HCT 39.6 08/10/2018  0925   HCT 39.5 12/20/2017 0949   PLT 286 08/10/2018 0925   PLT 291 12/20/2017 0949   MCV 93.8 08/10/2018 0925   MCV 98 (H) 12/20/2017 0949   MCH 29.9 08/10/2018 0925   MCHC 31.8 08/10/2018 0925   RDW 14.1 08/10/2018 0925   RDW 13.6 12/20/2017 0949   LYMPHSABS 1.5 03/18/2015 1514   MONOABS 0.5 03/18/2015 1514   EOSABS 0.0 03/18/2015 1514   BASOSABS 0.0 03/18/2015 1514    ASSESSMENT AND PLAN: 1. Controlled type 2 diabetes mellitus with complication, with long-term current use of insulin ([place]) We will send prescription for new glucometer and testing supplies to her pharmacy. Continue current dose of Humalog mix Discussed healthy eating habits. Encouraged her to be more active once she has had her gynecologic surgery Went over signs and symptoms of hypoglycemia and how to treat - POCT glucose (manual entry) - Blood Glucose Monitoring Suppl (ACCU-CHEK AVIVA PLUS) w/Device KIT; Used as directed  Dispense: 1 kit; Refill: 0  2. Essential hypertension Not at goal.  Increase Cozaar to 100 mg daily.  Continue current dose of metoprolol, isosorbide and diltiazem.  Try to limit salt in the foods - Basic metabolic panel - losartan (COZAAR) 50 MG tablet; Take 2 tablets (100 mg total) by mouth daily.  Dispense: 60 tablet; Refill: 5  3. Proliferative diabetic retinopathy of both eyes associated with type 2 diabetes mellitus, unspecified proliferative retinopathy type ([place]) Followed by ophthalmology  4. Postmenopausal bleeding Being worked up by gynecology.  Surgical procedure planned for next month. Recheck CBC given complaints of dizziness - CBC  5. Severe obesity (BMI >= 40) ([place]) See #1 above   Patient was given the opportunity to ask questions.  Patient verbalized understanding of the plan and was able to repeat key elements of the plan.   Orders Placed This Encounter  Procedures  . POCT glucose (manual entry)     Requested Prescriptions    No prescriptions requested or  ordered in this encounter    No follow-ups on file.  Karle Plumber, MD, FACP

## 2018-09-29 NOTE — Progress Notes (Signed)
Pt states she has been bleeding for over a month like a period and she is having blood clots, backache, headache and cramps  Pt states she was prescribed misoprostol from the gyn and it has not helped and it's making her sick

## 2018-09-29 NOTE — Patient Instructions (Signed)
Your blood pressure is not at goal.  Please increase the losartan to 50 mg 2 tablets daily.  You should not cut the potassium pills in half.  Take as prescribed which is 2 tablets in the morning and 1 tablet in the evening.

## 2018-09-30 LAB — CBC
Hematocrit: 38.6 % (ref 34.0–46.6)
Hemoglobin: 12 g/dL (ref 11.1–15.9)
MCH: 28.3 pg (ref 26.6–33.0)
MCHC: 31.1 g/dL — ABNORMAL LOW (ref 31.5–35.7)
MCV: 91 fL (ref 79–97)
Platelets: 307 10*3/uL (ref 150–450)
RBC: 4.24 x10E6/uL (ref 3.77–5.28)
RDW: 12.5 % (ref 11.7–15.4)
WBC: 11.4 10*3/uL — AB (ref 3.4–10.8)

## 2018-09-30 LAB — BASIC METABOLIC PANEL
BUN / CREAT RATIO: 10 — AB (ref 12–28)
BUN: 12 mg/dL (ref 8–27)
CO2: 22 mmol/L (ref 20–29)
Calcium: 9.1 mg/dL (ref 8.7–10.3)
Chloride: 109 mmol/L — ABNORMAL HIGH (ref 96–106)
Creatinine, Ser: 1.22 mg/dL — ABNORMAL HIGH (ref 0.57–1.00)
GFR calc Af Amer: 54 mL/min/{1.73_m2} — ABNORMAL LOW (ref >59)
GFR calc non Af Amer: 47 mL/min/{1.73_m2} — ABNORMAL LOW (ref >59)
GLUCOSE: 67 mg/dL (ref 65–99)
Potassium: 3.9 mmol/L (ref 3.5–5.2)
Sodium: 146 mmol/L — ABNORMAL HIGH (ref 134–144)

## 2018-10-02 ENCOUNTER — Telehealth: Payer: Self-pay

## 2018-10-02 NOTE — Telephone Encounter (Signed)
Contacted pt to go over lab results pt is aware and doesn't have any questions or concerns 

## 2018-10-03 DIAGNOSIS — Z9181 History of falling: Secondary | ICD-10-CM | POA: Diagnosis not present

## 2018-10-03 DIAGNOSIS — M170 Bilateral primary osteoarthritis of knee: Secondary | ICD-10-CM | POA: Diagnosis not present

## 2018-10-03 DIAGNOSIS — R269 Unspecified abnormalities of gait and mobility: Secondary | ICD-10-CM | POA: Diagnosis not present

## 2018-10-03 DIAGNOSIS — R079 Chest pain, unspecified: Secondary | ICD-10-CM | POA: Diagnosis not present

## 2018-10-04 NOTE — Patient Instructions (Signed)
[name]  10/04/2018      Your procedure is scheduled on:  10-11-2018   Report to [facility] Main  Entrance,  Report to admitting at  11:45 AM    Call this number if you have problems the morning of surgery [phone]        Remember: Do not eat food After Midnight.  Clear liquid diet from midnight until 7:30 AM day of surgery.  Nothing by mouth after 7:30 AM including water, candy, gum, mints.   BRUSH YOUR TEETH MORNING OF SURGERY AND RINSE YOUR MOUTH OUT.         Take these medicines the morning of surgery with A SIP OF WATER:  Isosorbide (imdur),  Metoprolol,  Diltiazem (dilacor xr),  Pantoprazole (protonix),  Eye drops as usual,   Albuterol inhaler if needed and bring with you day of surgery.                                   You may not have any metal on your body including hair pins and piercings.              Do not wear jewelry, make-up, lotions, powders or perfumes, deodorant              Do not wear nail polish.  Do not shave  48 hours prior to surgery.                     Do not bring valuables to the hospital. [place].  Contacts, dentures or bridgework may not be worn into surgery.  Leave suitcase in the car. After surgery it may be brought to your room.     Patients discharged the day of surgery will not be allowed to drive home. IF YOU ARE HAVING SURGERY AND GOING HOME THE SAME DAY, YOU MUST HAVE AN ADULT TO DRIVE YOU HOME AND BE WITH YOU FOR 24 HOURS. YOU MAY GO HOME BY TAXI OR UBER OR ORTHERWISE, BUT AN ADULT MUST ACCOMPANY YOU HOME AND STAY WITH YOU FOR 24 HOURS.    Name and phone number of your driver:    _____________________________________________________________________   How to Manage Your Diabetes Before and After Surgery  Why is it important to control my blood sugar before and after surgery? . Improving blood sugar levels before and after  surgery helps healing and can limit problems. . A way of improving blood sugar control is eating a healthy diet by: o  Eating less sugar and carbohydrates o  Increasing activity/exercise o  Talking with your doctor about reaching your blood sugar goals . High blood sugars (greater than 180 mg/dL) can raise your risk of infections and slow your recovery, so you will need to focus on controlling your diabetes during the weeks before surgery. . Make sure that the doctor who takes care of your diabetes knows about your planned surgery including the date and location.  How do I manage my blood sugar before surgery? . Check your blood sugar at least 4 times a day, starting 2 days before surgery, to make sure that the level is not too high or low. o Check your blood sugar the morning of your  surgery when you wake up and every 2 hours until you get to the Short Stay unit. . If your blood sugar is less than 70 mg/dL, you will need to treat for low blood sugar: o Do not take insulin. o Treat a low blood sugar (less than 70 mg/dL) with  cup of clear juice (cranberry or apple), 4 glucose tablets, OR glucose gel. o Recheck blood sugar in 15 minutes after treatment (to make sure it is greater than 70 mg/dL). If your blood sugar is not greater than 70 mg/dL on recheck, call [phone] for further instructions. . Report your blood sugar to the short stay nurse when you get to Short Stay.  . If you are admitted to the hospital after surgery: o Your blood sugar will be checked by the staff and you will probably be given insulin after surgery (instead of oral diabetes medicines) to make sure you have good blood sugar levels. o The goal for blood sugar control after surgery is 80-180 mg/dL.   WHAT DO I DO ABOUT MY DIABETES MEDICATION?  MORNING DAY BEFORE SURGERY,  Do insulin dose as usual  . THE NIGHT/ EVENING  BEFORE SURGERY, take   70%  units of   Humalog 75/25    Insulin @ 35 units      . THE MORNING  OF SURGERY, take   NO    insulin.  . If your CBG is greater than 220 mg/dL, you may take  of your sliding scale  . (correction) dose of insulin  Patient Signature:  Date:   Nurse Signature:  Date:   Reviewed and Endorsed by [facility] Patient Education Committee, August 2015   CLEAR LIQUID DIET   Foods Allowed                                                                     Foods Excluded  Coffee and tea, regular and decaf                             liquids that you cannot  Plain Jell-O in any flavor                                             see through such as: Fruit ices (not with fruit pulp)                                     milk, soups, orange juice  Iced Popsicles                                    All solid food Carbonated beverages, regular and diet                                    Cranberry, grape and apple juices Sports drinks like Gatorade Lightly seasoned clear broth or  consume(fat free) Sugar, honey syrup   _____________________________________________________________________            [facility] - Preparing for Surgery Before surgery, you can play an important role.  Because skin is not sterile, your skin needs to be as free of germs as possible.  You can reduce the number of germs on your skin by washing with CHG (chlorahexidine gluconate) soap before surgery.  CHG is an antiseptic cleaner which kills germs and bonds with the skin to continue killing germs even after washing. Please DO NOT use if you have an allergy to CHG or antibacterial soaps.  If your skin becomes reddened/irritated stop using the CHG and inform your nurse when you arrive at Short Stay. Do not shave (including legs and underarms) for at least 48 hours prior to the first CHG shower.  You may shave your face/neck. Please follow these instructions carefully:  1.  Shower with CHG Soap the night before surgery and the  morning of Surgery.  2.  If you choose to wash your hair, wash your  hair first as usual with your  normal  shampoo.  3.  After you shampoo, rinse your hair and body thoroughly to remove the  shampoo.                            4.  Use CHG as you would any other liquid soap.  You can apply chg directly  to the skin and wash                       Gently with a scrungie or clean washcloth.  5.  Apply the CHG Soap to your body ONLY FROM THE NECK DOWN.   Do not use on face/ open                           Wound or open sores. Avoid contact with eyes, ears mouth and genitals (private parts).                       Wash face,  Genitals (private parts) with your normal soap.             6.  Wash thoroughly, paying special attention to the area where your surgery  will be performed.  7.  Thoroughly rinse your body with warm water from the neck down.  8.  DO NOT shower/wash with your normal soap after using and rinsing off  the CHG Soap.             9.  Pat yourself dry with a clean towel.            10.  Wear clean pajamas.            11.  Place clean sheets on your bed the night of your first shower and do not  sleep with pets. Day of Surgery : Do not apply any lotions/deodorants the morning of surgery.  Please wear clean clothes to the hospital/surgery center.  ________________________________________________________________________

## 2018-10-05 ENCOUNTER — Encounter (HOSPITAL_COMMUNITY)
Admission: RE | Admit: 2018-10-05 | Discharge: 2018-10-05 | Disposition: A | Discharge status: Home / Self Care | Payer: Medicare Other | Source: Ambulatory Visit | Attending: Family Medicine | Admitting: Family Medicine

## 2018-10-05 ENCOUNTER — Other Ambulatory Visit: Payer: Self-pay

## 2018-10-05 ENCOUNTER — Encounter (HOSPITAL_COMMUNITY): Payer: Self-pay

## 2018-10-05 DIAGNOSIS — E785 Hyperlipidemia, unspecified: Secondary | ICD-10-CM | POA: Insufficient documentation

## 2018-10-05 DIAGNOSIS — G4733 Obstructive sleep apnea (adult) (pediatric): Secondary | ICD-10-CM | POA: Diagnosis not present

## 2018-10-05 DIAGNOSIS — E1122 Type 2 diabetes mellitus with diabetic chronic kidney disease: Secondary | ICD-10-CM | POA: Diagnosis not present

## 2018-10-05 DIAGNOSIS — E11319 Type 2 diabetes mellitus with unspecified diabetic retinopathy without macular edema: Secondary | ICD-10-CM | POA: Diagnosis not present

## 2018-10-05 DIAGNOSIS — Z793 Long term (current) use of hormonal contraceptives: Secondary | ICD-10-CM | POA: Insufficient documentation

## 2018-10-05 DIAGNOSIS — Z6841 Body Mass Index (BMI) 40.0 and over, adult: Secondary | ICD-10-CM | POA: Insufficient documentation

## 2018-10-05 DIAGNOSIS — Z01812 Encounter for preprocedural laboratory examination: Secondary | ICD-10-CM | POA: Insufficient documentation

## 2018-10-05 DIAGNOSIS — H409 Unspecified glaucoma: Secondary | ICD-10-CM | POA: Diagnosis not present

## 2018-10-05 DIAGNOSIS — N950 Postmenopausal bleeding: Secondary | ICD-10-CM | POA: Diagnosis not present

## 2018-10-05 DIAGNOSIS — K219 Gastro-esophageal reflux disease without esophagitis: Secondary | ICD-10-CM | POA: Diagnosis not present

## 2018-10-05 DIAGNOSIS — M1710 Unilateral primary osteoarthritis, unspecified knee: Secondary | ICD-10-CM | POA: Diagnosis not present

## 2018-10-05 DIAGNOSIS — I252 Old myocardial infarction: Secondary | ICD-10-CM | POA: Insufficient documentation

## 2018-10-05 DIAGNOSIS — I2510 Atherosclerotic heart disease of native coronary artery without angina pectoris: Secondary | ICD-10-CM | POA: Insufficient documentation

## 2018-10-05 DIAGNOSIS — Z794 Long term (current) use of insulin: Secondary | ICD-10-CM | POA: Diagnosis not present

## 2018-10-05 DIAGNOSIS — D509 Iron deficiency anemia, unspecified: Secondary | ICD-10-CM | POA: Insufficient documentation

## 2018-10-05 DIAGNOSIS — I130 Hypertensive heart and chronic kidney disease with heart failure and stage 1 through stage 4 chronic kidney disease, or unspecified chronic kidney disease: Secondary | ICD-10-CM | POA: Insufficient documentation

## 2018-10-05 DIAGNOSIS — Z7982 Long term (current) use of aspirin: Secondary | ICD-10-CM | POA: Diagnosis not present

## 2018-10-05 DIAGNOSIS — Z79899 Other long term (current) drug therapy: Secondary | ICD-10-CM | POA: Insufficient documentation

## 2018-10-05 DIAGNOSIS — N183 Chronic kidney disease, stage 3 (moderate): Secondary | ICD-10-CM | POA: Insufficient documentation

## 2018-10-05 DIAGNOSIS — H5461 Unqualified visual loss, right eye, normal vision left eye: Secondary | ICD-10-CM | POA: Insufficient documentation

## 2018-10-05 DIAGNOSIS — I471 Supraventricular tachycardia: Secondary | ICD-10-CM | POA: Diagnosis not present

## 2018-10-05 HISTORY — DX: Personal history of other infectious and parasitic diseases: Z86.19

## 2018-10-05 HISTORY — DX: Supraventricular tachycardia, unspecified: I47.10

## 2018-10-05 HISTORY — DX: Type 2 diabetes mellitus with diabetic neuropathy, unspecified: E11.40

## 2018-10-05 HISTORY — DX: Chronic kidney disease, stage 3 (moderate): N18.3

## 2018-10-05 HISTORY — DX: Chest pain, unspecified: R07.9

## 2018-10-05 HISTORY — DX: Weakness: R53.1

## 2018-10-05 HISTORY — DX: Old myocardial infarction: I25.2

## 2018-10-05 HISTORY — DX: Type 2 diabetes mellitus without complications: E11.9

## 2018-10-05 HISTORY — DX: Obstructive sleep apnea (adult) (pediatric): G47.33

## 2018-10-05 HISTORY — DX: Unspecified osteoarthritis, unspecified site: M19.90

## 2018-10-05 HISTORY — DX: Supraventricular tachycardia: I47.1

## 2018-10-05 HISTORY — DX: Long term (current) use of insulin: Z79.4

## 2018-10-05 HISTORY — DX: Iron deficiency anemia secondary to blood loss (chronic): D50.0

## 2018-10-05 HISTORY — DX: Legal blindness, as defined in USA: H54.8

## 2018-10-05 HISTORY — DX: Other chronic pain: G89.29

## 2018-10-05 HISTORY — DX: Localized edema: R60.0

## 2018-10-05 HISTORY — DX: Unspecified glaucoma: H40.9

## 2018-10-05 HISTORY — DX: Chronic diastolic (congestive) heart failure: I50.32

## 2018-10-05 HISTORY — DX: Presence of spectacles and contact lenses: Z97.3

## 2018-10-05 HISTORY — DX: Chronic kidney disease, stage 3 unspecified: N18.30

## 2018-10-05 LAB — GLUCOSE, CAPILLARY: Glucose-Capillary: 232 mg/dL — ABNORMAL HIGH (ref 70–99)

## 2018-10-05 LAB — HEMOGLOBIN A1C
Hgb A1c MFr Bld: 7.5 % — ABNORMAL HIGH (ref 4.8–5.6)
Mean Plasma Glucose: 168.55 mg/dL

## 2018-10-05 NOTE — Progress Notes (Addendum)
EKG dated 09-04-2018 in epic.  ECHO dated 02-10-2016 in epic.  CBC and BMP results dated 09-29-2018 in epic.  Cardiac clearance with office note dated 09-04-2018 in epic, dr croitoru (k. Lawrence NP).  Printed cardiac cath and placed in chart dated 02-27-2009.  Anesthesia , Konrad Felix PA , to assess pt at PAT visit today.  ADDENDUM:  Per pt was told by dr Kennon Rounds to continue ASA did not need to stop.  Also, last taken nitroglycerin for chest pain 3 weeks ago. Denied chest pain today, stated always short of breath, no upper airway infection in past month, edema lower extremities as usual (pt does take two diuretics),  Pt used wheelchair because unable to walk long distance and uses blind walking cane.  Chart given to anesthesia, Konrad Felix PA.

## 2018-10-07 NOTE — H&P (Addendum)
[name] is an 63 y.o. [place] female.   Chief Complaint: Postmenopausal bleeding HPI: PMB after 6 years of menopause with failed office EMB and 2 cm thickness on u/s. Patient with multiple medical problems, as she is not a great surgical candidate and will place IUD in case endometrial cancer is found. Failed office procedure x 2. No tissue obtained.  Past Medical History:  Diagnosis Date  . Blind right eye   . Chronic back pain    "my whole back" (03/18/2015)  . Chronic chest pain    takes isosorbide  . Chronic diastolic heart failure ([place]) 9/13   echo 03/20/15 LV Ef of 60-65%, grade 2DD and PA peak pressure: 36 mm Hg   . CKD (chronic kidney disease), stage III ([place])   . Coronary artery disease cardiologist-- dr croitoru   per cardiac cath 02-07-2009 -- diffuse 3V CAD involving RCA, CFx, and D1,  pt not a candidate for bypass surgery or angioplasty,  medical management  . Diabetic neuropathy ([place])   . Diabetic retinopathy ([place])    bilateral proliferative  . Diastolic CHF, chronic ([place])   . Dyspnea    "always"  . Edema of both lower extremities   . Generalized weakness   . GERD (gastroesophageal reflux disease)   . Glaucoma, both eyes   . History of non-ST elevation myocardial infarction (NSTEMI)    02-25-2009;  05-15-2009;  01-08-2010;  04-04-2012 this MI was post-op surgery for abscess on 04-03-2012  . History of sepsis   . Hyperlipemia   . Hypertension   . Insulin dependent type 2 diabetes mellitus ([place])    followed by pcp  . Iron deficiency anemia due to chronic blood loss    uterine bleeding  . Legally blind in left eye, as defined in [place]    per pt states vision "is foggy"  . Mild obstructive sleep apnea    study in epic 04-19-2012 mild osa,  recommendation's were wt. loss, dental  appliance, surgery, or cpap  . Morbid obesity with BMI of 40.0-44.9, adult ([place])   . OA (osteoarthritis)    knees  . PSVT (paroxysmal supraventricular tachycardia) ([place])   . Renal  insufficiency    pt. denies  . Seasonal allergies   . Wears glasses     Past Surgical History:  Procedure Laterality Date  . BIOPSY  02/13/2018   Procedure: BIOPSY;  Surgeon: Ladene Artist, MD;  [facility];  Service: Endoscopy;;  . BREAST SURGERY Right 2019    breast biopsy,  per pt benign  . CARDIAC CATHETERIZATION  02-27-2009   dr al little   diffuse 3V CAD , involving RCA, CFx, and D1;  inferior wall abnormalities, low normal ef 50%  . CATARACT EXTRACTION Right   . CATARACT EXTRACTION W/ INTRAOCULAR LENS IMPLANT Left 10-11-2007   @[facility]   . COLONOSCOPY WITH PROPOFOL N/A 02/13/2018   Procedure: COLONOSCOPY WITH PROPOFOL;  Surgeon: Ladene Artist, MD;  [facility];  Service: Endoscopy;  Laterality: N/A;  . DILATION AND CURETTAGE OF UTERUS  x2 last one 2000  . EYE SURGERY Bilateral 2009 to 2010   x2  left eye;  x2  right eye   . INCISION AND DRAINAGE PERIRECTAL ABSCESS  04/03/2012  . IR US GUIDE VASC ACCESS LEFT  02/13/2018  . IR VENIPUNCTURE 81YRS/OLDER BY MD  02/13/2018  . POLYPECTOMY  02/13/2018   Procedure: POLYPECTOMY;  Surgeon: Ladene Artist, MD;  Location: [facility];  Service: Endoscopy;;  Family History  Problem Relation Age of Onset  . Diabetes Mother   . Heart disease Mother   . Heart attack Mother   . Hypertension Mother   . Breast cancer Mother   . Colon polyps Father   . Diabetes Father   . Heart disease Father   . Heart attack Father   . Stroke Father   . Hypertension Father   . Diabetes Brother   . Diabetes Sister   . Heart disease Brother   . Heart disease Sister   . Diabetes Maternal Grandmother   . Hypertension Maternal Grandmother   . Hypertension Brother   . Hypertension Sister   . Breast cancer Cousin   . Colon cancer Cousin   . Esophageal cancer Neg Hx   . Rectal cancer Neg Hx   . Stomach cancer Neg Hx    Social History:  reports that she has never smoked. She has never used smokeless tobacco. She reports that she  does not drink alcohol or use drugs.  Allergies:  Allergies  Allergen Reactions  . Ativan [Lorazepam] Anaphylaxis  . Orange Fruit [Citrus] Anaphylaxis and Other (See Comments)    Pt stated throat swelling, itching    No medications prior to admission.    A comprehensive review of systems was negative.  Last menstrual period 04/02/2012. General appearance: alert, cooperative and appears stated age Head: Normocephalic, without obvious abnormality, atraumatic Neck: supple, symmetrical, trachea midline Lungs: normal effort Heart: regular rate and rhythm Abdomen: soft, non-tender; bowel sounds normal; no masses,  no organomegaly Extremities: extremities normal, atraumatic, no cyanosis or edema Skin: Skin color, texture, turgor normal. No rashes or lesions Neurologic: Grossly normal   Lab Results  Component Value Date   WBC 11.4 (H) 09/29/2018   HGB 12.0 09/29/2018   HCT 38.6 09/29/2018   MCV 91 09/29/2018   PLT 307 09/29/2018   Lab Results  Component Value Date   PREGTESTUR NEGATIVE 11/28/2015   PREGSERUM NEGATIVE 02/16/2008     Assessment/Plan Principal Problem:   Post-menopausal bleeding  Per cards clearance I agree she is moderate risk. No intervention that we can implement to reduce that risk , though. Please make sure her beta blockers are not interrupted around the time of surgery. Thanks   For Dilation and Curettage with Hysteroscopy and IUD placement. Risks include but are not limited to bleeding, infection, injury to surrounding structures, including bowel, bladder and ureters, blood clots, and death.  Likelihood of success is high.    [name] 10/07/2018, 2:20 PM

## 2018-10-09 NOTE — Progress Notes (Signed)
Anesthesia Chart Review   Case:  [id number] Date/Time:  10/11/18 1330   Procedure:  DILATATION AND CURETTAGE /HYSTEROSCOPY (N/A )   Anesthesia type:  Choice   Pre-op diagnosis:  PMB   [facility]:  [facility] 05 / [facility]   Surgeon:  [name], MD      DISCUSSION: 63 yo never smoker with h/o HTN, GERD, CHF, PSVT, CKD Stage III, HLD, CAD (NSTEMI 2010 no good PCI or bypass options, 2013 in setting of perirectal abscess/sepsis, nitrates added), DM II, glaucoma (blind right eye), OSA, PMB, anemia scheduled for above procedure 10/11/18 with Dr. Darron Doom.   Pt last seen by PCP 09/29/18. Cozaar increased at this visit due to poorly controlled BP.  BP at PAT visit 10/05/18 212/63.  Importance of BP optimization discussed.  Will evaluate DOS.   Pt last seen by Cardiologist, Dr. Sanda Klein, 09/04/18.  Per his note, "she is moderate risk. No intervention that we can implement to reduce that risk , though.  Please make sure her beta blockers are not interrupted around the time of surgery."  Seen by myself at PAT visit 10/11/18, denies chest pain, SOB, cough, fever, LE edema.  Normal PE.   Pt can proceed with planned procedure barring acute status change.  VS: BP (!) 212/63   Pulse 67   Temp 37.1 C (Oral)   Resp 20   Ht _0  (1.6 m)   Wt 128 kg   LMP 04/02/2012   SpO2 97%   BMI 49.99 kg/m   PROVIDERS: Ladell Pier, MD is PCP   Sanda Klein, MD is Cardiologist   Pearson Grippe, MD is nephrologist, last seen 03/02/18, stable  LABS: Labs reviewed: Acceptable for surgery. (all labs ordered are listed, but only abnormal results are displayed)  Labs Reviewed  GLUCOSE, CAPILLARY - Abnormal; Notable for the following components:      Result Value   Glucose-Capillary 232 (*)    All other components within normal limits  HEMOGLOBIN A1C - Abnormal; Notable for the following components:   Hgb A1c MFr Bld 7.5 (*)    All other components within normal limits      IMAGES:   EKG: 09/04/2018 Rate 72 bpm NSR RBBB  CV: Echo 02/10/16 Study Conclusions  - Left ventricle: The cavity size was normal. Wall thickness was   normal. Systolic function was normal. The estimated ejection   fraction was in the range of 55% to 60%. Wall motion was normal;   there were no regional wall motion abnormalities. - Left atrium: The atrium was mildly dilated.  Cardiac cath 7/29/190: - LAD: The LAD extended down to the apex of the heart and was by far the best of her vessels. There was mid less than 40% irregularities inthe LAD. The first diagonal was small with ostial 70% and the remainderof the diagonal was diffusely diseased. - Circumflex: The circumflex gave rise to what appears to be three OM vessels. The circumflex itself was totally occluded in about itsmidportion. The first OM was very small with a mid area of 80%narrowing. The second and the third OM vessels appear to be occluded from the native circulation. There was late filling of the OM-2 and OM-3 distally. These vessels appeared to be very small. - Right coronary artery: This vessel was diffusely diseased. There is proximal 80% narrowing, mid sequential 80% and 90% narrowing, distal90% narrowing. The PDA was occluded. There are two small posteriorlateral vessels. - Ventriculography: The inferior wall to be  severely hypokinetic to akinetic. The anterior wall and apical segmentsshowed good contractility. The ejection fraction was approximately 50%. The end-diastolic pressure was 24. No mitral regurgitationwas appreciated. CONCLUSION: 1. Inferior wall motion abnormality, but despite this was relatively low normal ejection fraction. 2. Diffuse coronary disease involving the RCA circumflex and the firstdiagonal. 3. No renal artery stenosis. 4. No abdominal aortic aneurysm. DISCUSSION: Clearly CABG would be the best therapy for this lady;however, I am not sure there are distal  targets for bypass. I will askCVTS to render an opinion. Medical therapy is the only option. Thereis nothing in the circumflex that is amenable to intervention. The first diagonal is too small for intervention and the RCA although havingsome areas that could possibly be treated with PCI, supplies are totallyoccluded PDA, which would be a very little value should we try to openthe right without having some runoff in the PDA vessel. (Seen by CT surgeon Gilford Raid, MD: CABG not recommended given small diffusely diseased vessels with high probability for graft failure in an already very high surgical risk patient due to morbid obesity and poorly controlled diabetes.) Past Medical History:  Diagnosis Date  . Blind right eye   . Chronic back pain    "my whole back" (03/18/2015)  . Chronic chest pain    takes isosorbide  . Chronic diastolic heart failure ([place]) 9/13   echo 03/20/15 LV Ef of 60-65%, grade 2DD and PA peak pressure: 36 mm Hg   . CKD (chronic kidney disease), stage III ([place])   . Coronary artery disease cardiologist-- dr croitoru   per cardiac cath 02-07-2009 -- diffuse 3V CAD involving RCA, CFx, and D1,  pt not a candidate for bypass surgery or angioplasty,  medical management  . Diabetic neuropathy ([place])   . Diabetic retinopathy ([place])    bilateral proliferative  . Diastolic CHF, chronic ([place])   . Dyspnea    "always"  . Edema of both lower extremities   . Generalized weakness   . GERD (gastroesophageal reflux disease)   . Glaucoma, both eyes   . History of non-ST elevation myocardial infarction (NSTEMI)    02-25-2009;  05-15-2009;  01-08-2010;  04-04-2012 this MI was post-op surgery for abscess on 04-03-2012  . History of sepsis   . Hyperlipemia   . Hypertension   . Insulin dependent type 2 diabetes mellitus ([place])    followed by pcp  . Iron deficiency anemia due to chronic blood loss    uterine bleeding  . Legally blind in left eye, as defined in [place]    per pt states  vision "is foggy"  . Mild obstructive sleep apnea    study in epic 04-19-2012 mild osa,  recommendation's were wt. loss, dental  appliance, surgery, or cpap  . Morbid obesity with BMI of 40.0-44.9, adult ([place])   . OA (osteoarthritis)    knees  . PSVT (paroxysmal supraventricular tachycardia) ([place])   . Renal insufficiency    pt. denies  . Seasonal allergies   . Wears glasses     Past Surgical History:  Procedure Laterality Date  . BIOPSY  02/13/2018   Procedure: BIOPSY;  Surgeon: Ladene Artist, MD;  [facility];  Service: Endoscopy;;  . BREAST SURGERY Right 2019    breast biopsy,  per pt benign  . CARDIAC CATHETERIZATION  02-27-2009   dr al little   diffuse 3V CAD , involving RCA, CFx, and D1;  inferior wall abnormalities, low normal ef 50%  . CATARACT EXTRACTION Right   .  CATARACT EXTRACTION W/ INTRAOCULAR LENS IMPLANT Left 10-11-2007   _0   . COLONOSCOPY WITH PROPOFOL N/A 02/13/2018   Procedure: COLONOSCOPY WITH PROPOFOL;  Surgeon: Ladene Artist, MD;  [facility];  Service: Endoscopy;  Laterality: N/A;  . DILATION AND CURETTAGE OF UTERUS  x2 last one 2000  . EYE SURGERY Bilateral 2009 to 2010   x2  left eye;  x2  right eye   . INCISION AND DRAINAGE PERIRECTAL ABSCESS  04/03/2012  . IR US GUIDE VASC ACCESS LEFT  02/13/2018  . IR VENIPUNCTURE 77YRS/OLDER BY MD  02/13/2018  . POLYPECTOMY  02/13/2018   Procedure: POLYPECTOMY;  Surgeon: Ladene Artist, MD;  Location: [facility];  Service: Endoscopy;;    MEDICATIONS: . ACCU-CHEK SOFTCLIX LANCETS lancets  . acetaminophen (TYLENOL) 500 MG tablet  . Acetaminophen-Caff-Pyrilamine (MIDOL COMPLETE PO)  . acetaZOLAMIDE (DIAMOX) 500 MG capsule  . aspirin EC 81 MG tablet  . atorvastatin (LIPITOR) 80 MG tablet  . baclofen (LIORESAL) 10 MG tablet  . bismuth subsalicylate (PEPTO BISMOL) 262 MG/15ML suspension  . Blood Glucose Monitoring Suppl (ACCU-CHEK AVIVA PLUS) w/Device KIT  . Blood Pressure Monitoring (BLOOD  PRESSURE MONITOR/ARM) DEVI  . brimonidine-timolol (COMBIGAN) 0.2-0.5 % ophthalmic solution  . calcium carbonate (TUMS - DOSED IN MG ELEMENTAL CALCIUM) 500 MG chewable tablet  . Cyanocobalamin (VITAMIN B-12) 1000 MCG TABS  . diltiazem (DILACOR XR) 240 MG 24 hr capsule  . docusate sodium (COLACE) 100 MG capsule  . ferrous sulfate 325 (65 FE) MG tablet  . furosemide (LASIX) 40 MG tablet  . furosemide (LASIX) 40 MG tablet  . furosemide (LASIX) 80 MG tablet  . glucose blood test strip  . glucose monitoring kit (FREESTYLE) monitoring kit  . insulin lispro protamine-lispro (HUMALOG MIX 75/25) (75-25) 100 UNIT/ML SUSP injection  . isosorbide mononitrate (IMDUR) 120 MG 24 hr tablet  . levonorgestrel (MIRENA) 20 MCG/24HR IUD  . losartan (COZAAR) 50 MG tablet  . LUMIGAN 0.01 % SOLN  . Menthol-Methyl Salicylate (MUSCLE RUB EX)  . metoCLOPramide (REGLAN) 5 MG tablet  . metolazone (ZAROXOLYN) 2.5 MG tablet  . metoprolol (TOPROL-XL) 200 MG 24 hr tablet  . misoprostol (CYTOTEC) 200 MCG tablet  . nitroGLYCERIN (NITROSTAT) 0.4 MG SL tablet  . pantoprazole (PROTONIX) 40 MG tablet  . potassium chloride SA (K-DUR,KLOR-CON) 20 MEQ tablet  . RELION INSULIN SYRINGE 31G X 15/64" 1 ML MISC   No current facility-administered medications for this encounter.     Maia Plan [facility] Pre-Surgical Testing [phone] 10/09/18 10:58 AM

## 2018-10-11 ENCOUNTER — Encounter (HOSPITAL_COMMUNITY): Payer: Self-pay | Admitting: *Deleted

## 2018-10-11 ENCOUNTER — Ambulatory Visit (HOSPITAL_COMMUNITY)
Admission: RE | Admit: 2018-10-11 | Discharge: 2018-10-11 | Disposition: A | Discharge status: Home / Self Care | Payer: Medicare Other | Attending: Family Medicine | Admitting: Family Medicine

## 2018-10-11 ENCOUNTER — Ambulatory Visit (HOSPITAL_COMMUNITY): Payer: Medicare Other | Admitting: Anesthesiology

## 2018-10-11 ENCOUNTER — Ambulatory Visit (HOSPITAL_COMMUNITY): Payer: Medicare Other | Admitting: Vascular Surgery

## 2018-10-11 ENCOUNTER — Encounter (HOSPITAL_COMMUNITY)
Admission: RE | Disposition: A | Discharge status: Home / Self Care | Payer: Self-pay | Source: Home / Self Care | Attending: Family Medicine

## 2018-10-11 DIAGNOSIS — Z794 Long term (current) use of insulin: Secondary | ICD-10-CM | POA: Insufficient documentation

## 2018-10-11 DIAGNOSIS — K219 Gastro-esophageal reflux disease without esophagitis: Secondary | ICD-10-CM | POA: Diagnosis not present

## 2018-10-11 DIAGNOSIS — Z7982 Long term (current) use of aspirin: Secondary | ICD-10-CM | POA: Insufficient documentation

## 2018-10-11 DIAGNOSIS — I130 Hypertensive heart and chronic kidney disease with heart failure and stage 1 through stage 4 chronic kidney disease, or unspecified chronic kidney disease: Secondary | ICD-10-CM | POA: Diagnosis not present

## 2018-10-11 DIAGNOSIS — E1122 Type 2 diabetes mellitus with diabetic chronic kidney disease: Secondary | ICD-10-CM | POA: Diagnosis not present

## 2018-10-11 DIAGNOSIS — Z6841 Body Mass Index (BMI) 40.0 and over, adult: Secondary | ICD-10-CM | POA: Diagnosis not present

## 2018-10-11 DIAGNOSIS — H5461 Unqualified visual loss, right eye, normal vision left eye: Secondary | ICD-10-CM | POA: Insufficient documentation

## 2018-10-11 DIAGNOSIS — E113593 Type 2 diabetes mellitus with proliferative diabetic retinopathy without macular edema, bilateral: Secondary | ICD-10-CM | POA: Diagnosis not present

## 2018-10-11 DIAGNOSIS — N8502 Endometrial intraepithelial neoplasia [EIN]: Secondary | ICD-10-CM | POA: Diagnosis not present

## 2018-10-11 DIAGNOSIS — G4733 Obstructive sleep apnea (adult) (pediatric): Secondary | ICD-10-CM | POA: Diagnosis not present

## 2018-10-11 DIAGNOSIS — N183 Chronic kidney disease, stage 3 (moderate): Secondary | ICD-10-CM | POA: Insufficient documentation

## 2018-10-11 DIAGNOSIS — I252 Old myocardial infarction: Secondary | ICD-10-CM | POA: Insufficient documentation

## 2018-10-11 DIAGNOSIS — Z79899 Other long term (current) drug therapy: Secondary | ICD-10-CM | POA: Diagnosis not present

## 2018-10-11 DIAGNOSIS — E1140 Type 2 diabetes mellitus with diabetic neuropathy, unspecified: Secondary | ICD-10-CM | POA: Diagnosis not present

## 2018-10-11 DIAGNOSIS — I5032 Chronic diastolic (congestive) heart failure: Secondary | ICD-10-CM | POA: Diagnosis not present

## 2018-10-11 DIAGNOSIS — I2510 Atherosclerotic heart disease of native coronary artery without angina pectoris: Secondary | ICD-10-CM | POA: Insufficient documentation

## 2018-10-11 DIAGNOSIS — N950 Postmenopausal bleeding: Secondary | ICD-10-CM

## 2018-10-11 DIAGNOSIS — H409 Unspecified glaucoma: Secondary | ICD-10-CM | POA: Diagnosis not present

## 2018-10-11 DIAGNOSIS — E785 Hyperlipidemia, unspecified: Secondary | ICD-10-CM | POA: Diagnosis not present

## 2018-10-11 DIAGNOSIS — I110 Hypertensive heart disease with heart failure: Secondary | ICD-10-CM | POA: Diagnosis not present

## 2018-10-11 HISTORY — PX: HYSTEROSCOPY W/D&C: SHX1775

## 2018-10-11 HISTORY — PX: INTRAUTERINE DEVICE (IUD) INSERTION: SHX5877

## 2018-10-11 LAB — GLUCOSE, CAPILLARY
Glucose-Capillary: 232 mg/dL — ABNORMAL HIGH (ref 70–99)
Glucose-Capillary: 192 mg/dL — ABNORMAL HIGH (ref 70–99)

## 2018-10-11 SURGERY — DILATATION AND CURETTAGE /HYSTEROSCOPY
Anesthesia: General | Site: Vagina

## 2018-10-11 MED ORDER — DEXAMETHASONE SODIUM PHOSPHATE 10 MG/ML IJ SOLN
INTRAMUSCULAR | Status: AC
  Filled 2018-10-11: qty 1

## 2018-10-11 MED ORDER — LABETALOL HCL 5 MG/ML IV SOLN
INTRAVENOUS | Status: DC | PRN
  Administered 2018-10-11: 2.5 mg via INTRAVENOUS

## 2018-10-11 MED ORDER — PROMETHAZINE HCL 25 MG/ML IJ SOLN: 6.2500 mg | INTRAMUSCULAR | Status: DC | PRN

## 2018-10-11 MED ORDER — METOCLOPRAMIDE HCL 5 MG/ML IJ SOLN
5.0000 mg | Freq: Once | INTRAMUSCULAR | Status: AC
  Administered 2018-10-11: 5 mg via INTRAVENOUS

## 2018-10-11 MED ORDER — ONDANSETRON HCL 4 MG/2ML IJ SOLN
INTRAMUSCULAR | Status: DC | PRN
  Administered 2018-10-11: 4 mg via INTRAVENOUS

## 2018-10-11 MED ORDER — BUPIVACAINE HCL 0.25 % IJ SOLN
INTRAMUSCULAR | Status: DC | PRN
  Administered 2018-10-11: 20 mL

## 2018-10-11 MED ORDER — KETAMINE HCL 10 MG/ML IJ SOLN
INTRAMUSCULAR | Status: DC | PRN
  Administered 2018-10-11: 20 mg via INTRAVENOUS

## 2018-10-11 MED ORDER — SUCCINYLCHOLINE CHLORIDE 200 MG/10ML IV SOSY
PREFILLED_SYRINGE | INTRAVENOUS | Status: AC
  Filled 2018-10-11: qty 10

## 2018-10-11 MED ORDER — DEXAMETHASONE SODIUM PHOSPHATE 10 MG/ML IJ SOLN
INTRAMUSCULAR | Status: DC | PRN
  Administered 2018-10-11: 5 mg via INTRAVENOUS

## 2018-10-11 MED ORDER — FENTANYL CITRATE (PF) 100 MCG/2ML IJ SOLN
INTRAMUSCULAR | Status: AC
  Filled 2018-10-11: qty 4

## 2018-10-11 MED ORDER — SODIUM CHLORIDE 0.9 % IV SOLN
INTRAVENOUS | Status: DC | PRN
  Administered 2018-10-11: 25 ug/min via INTRAVENOUS

## 2018-10-11 MED ORDER — FENTANYL CITRATE (PF) 100 MCG/2ML IJ SOLN
INTRAMUSCULAR | Status: DC | PRN
  Administered 2018-10-11: 100 ug via INTRAVENOUS
  Administered 2018-10-11: 50 ug via INTRAVENOUS

## 2018-10-11 MED ORDER — SODIUM CHLORIDE 0.9 % IR SOLN
Status: DC | PRN
  Administered 2018-10-11: 6000 mL

## 2018-10-11 MED ORDER — SUCCINYLCHOLINE CHLORIDE 20 MG/ML IJ SOLN
INTRAMUSCULAR | Status: DC | PRN
  Administered 2018-10-11: 140 mg via INTRAVENOUS

## 2018-10-11 MED ORDER — FENTANYL CITRATE (PF) 250 MCG/5ML IJ SOLN
INTRAMUSCULAR | Status: AC
  Filled 2018-10-11: qty 5

## 2018-10-11 MED ORDER — BUPIVACAINE HCL (PF) 0.25 % IJ SOLN
INTRAMUSCULAR | Status: AC
  Filled 2018-10-11: qty 30

## 2018-10-11 MED ORDER — LIDOCAINE HCL (CARDIAC) PF 50 MG/5ML IV SOSY
PREFILLED_SYRINGE | INTRAVENOUS | Status: DC | PRN
  Administered 2018-10-11: 75 mg via INTRAVENOUS

## 2018-10-11 MED ORDER — LACTATED RINGERS IV SOLN
INTRAVENOUS | Status: DC
  Administered 2018-10-11 (×2): via INTRAVENOUS

## 2018-10-11 MED ORDER — PHENYLEPHRINE HCL 10 MG/ML IJ SOLN
INTRAMUSCULAR | Status: AC
  Filled 2018-10-11: qty 1

## 2018-10-11 MED ORDER — MIDAZOLAM HCL 2 MG/2ML IJ SOLN
INTRAMUSCULAR | Status: AC
  Filled 2018-10-11: qty 4

## 2018-10-11 MED ORDER — GLYCOPYRROLATE PF 0.2 MG/ML IJ SOSY
PREFILLED_SYRINGE | INTRAMUSCULAR | Status: AC
  Filled 2018-10-11: qty 1

## 2018-10-11 MED ORDER — PROPOFOL 10 MG/ML IV BOLUS
INTRAVENOUS | Status: AC
  Filled 2018-10-11: qty 40

## 2018-10-11 MED ORDER — FENTANYL CITRATE (PF) 100 MCG/2ML IJ SOLN
25.0000 ug | INTRAMUSCULAR | Status: DC | PRN
  Administered 2018-10-11 (×2): 50 ug via INTRAVENOUS

## 2018-10-11 MED ORDER — MIDAZOLAM HCL 5 MG/5ML IJ SOLN
INTRAMUSCULAR | Status: DC | PRN
  Administered 2018-10-11: 1 mg via INTRAVENOUS

## 2018-10-11 MED ORDER — GLYCOPYRROLATE 0.2 MG/ML IJ SOLN
INTRAMUSCULAR | Status: DC | PRN
  Administered 2018-10-11: 0.1 mg via INTRAVENOUS

## 2018-10-11 MED ORDER — LIDOCAINE 2% (20 MG/ML) 5 ML SYRINGE
INTRAMUSCULAR | Status: DC | PRN
  Administered 2018-10-11: 1.5 mg/kg/h via INTRAVENOUS

## 2018-10-11 MED ORDER — METOCLOPRAMIDE HCL 5 MG/ML IJ SOLN
INTRAMUSCULAR | Status: AC
  Filled 2018-10-11: qty 2

## 2018-10-11 MED ORDER — ONDANSETRON HCL 4 MG/2ML IJ SOLN
INTRAMUSCULAR | Status: AC
  Filled 2018-10-11: qty 4

## 2018-10-11 MED ORDER — ROCURONIUM BROMIDE 10 MG/ML (PF) SYRINGE
PREFILLED_SYRINGE | INTRAVENOUS | Status: AC
  Filled 2018-10-11: qty 10

## 2018-10-11 MED ORDER — PROPOFOL 10 MG/ML IV BOLUS
INTRAVENOUS | Status: DC | PRN
  Administered 2018-10-11 (×2): 50 mg via INTRAVENOUS
  Administered 2018-10-11: 150 mg via INTRAVENOUS

## 2018-10-11 SURGICAL SUPPLY — 17 items
BIPOLAR CUTTING LOOP 21FR (ELECTRODE)
CANISTER SUCT 3000ML PPV (MISCELLANEOUS) ×3 IMPLANT
CATH ROBINSON RED A/P 16FR (CATHETERS) ×5 IMPLANT
DEVICE MYOSURE LITE (MISCELLANEOUS) ×2 IMPLANT
DILATOR CANAL MILEX (MISCELLANEOUS) ×2 IMPLANT
ELECT REM PT RETURN 15FT ADLT (MISCELLANEOUS) IMPLANT
GLOVE BIOGEL PI IND STRL 7.0 (GLOVE) ×6 IMPLANT
GLOVE BIOGEL PI INDICATOR 7.0 (GLOVE) ×4
GLOVE ECLIPSE 7.0 STRL STRAW (GLOVE) ×5 IMPLANT
GOWN STRL REUS W/TWL LRG LVL3 (GOWN DISPOSABLE) ×10 IMPLANT
KIT PROCEDURE FLUENT (KITS) ×2 IMPLANT
KIT TURNOVER KIT A (KITS) IMPLANT
LOOP CUTTING BIPOLAR 21FR (ELECTRODE) IMPLANT
PACK VAGINAL MINOR WOMEN LF (CUSTOM PROCEDURE TRAY) ×5 IMPLANT
PAD OB MATERNITY 4.3X12.25 (PERSONAL CARE ITEMS) ×5 IMPLANT
SEAL ROD LENS SCOPE MYOSURE (ABLATOR) ×2 IMPLANT
TOWEL OR 17X26 10 PK STRL BLUE (TOWEL DISPOSABLE) ×8 IMPLANT

## 2018-10-11 NOTE — Addendum Note (Signed)
Addendum  created 10/11/18 1907 by Lissa Morales, CRNA   Intraprocedure Meds edited

## 2018-10-11 NOTE — Transfer of Care (Signed)
Immediate Anesthesia Transfer of Care Note  Patient: [name]  Procedure(s) Performed: DILATATION AND CURETTAGE /HYSTEROSCOPY (N/A Vagina ) INTRAUTERINE DEVICE (IUD) INSERTION (Cervix)  Patient Location: PACU  Anesthesia Type:General  Level of Consciousness: awake, alert , oriented and patient cooperative  Airway & Oxygen Therapy: Patient connected to face mask oxygen  Post-op Assessment: Report given to RN, Post -op Vital signs reviewed and stable and Patient moving all extremities X 4  Post vital signs: stable  Last Vitals:  Vitals Value Taken Time  BP 140/63 10/11/2018  2:45 PM  Temp 36.9 C 10/11/2018  2:40 PM  Pulse 72 10/11/2018  2:47 PM  Resp 16 10/11/2018  2:47 PM  SpO2 100 % 10/11/2018  2:47 PM  Vitals shown include unvalidated device data.  Last Pain:  Vitals:   10/11/18 1440  TempSrc:   PainSc: 0-No pain         Complications: No apparent anesthesia complications

## 2018-10-11 NOTE — Interval H&P Note (Signed)
History and Physical Interval Note:  10/11/2018 1:18 PM  [name]  has presented today for surgery, with the diagnosis of PMB.  The various methods of treatment have been discussed with the patient and family. After consideration of risks, benefits and other options for treatment, the patient has consented to  Procedure(s): DILATATION AND CURETTAGE /HYSTEROSCOPY (N/A) as a surgical intervention.  The patient's history has been reviewed, patient examined, no change in status, stable for surgery.  I have reviewed the patient's chart and labs.  Questions were answered to the patient's satisfaction.     [name]

## 2018-10-11 NOTE — Progress Notes (Signed)
Dr Kalman Shan aware of high BP in pacu. No orders received. States to cont with plan to discharge home.

## 2018-10-11 NOTE — Op Note (Signed)
Preoperative diagnoses:  Postmenopausal bleeding  Postoperative diagnosis: Same  Procedure: D & C, hysteroscopy, Myosure sampling, Liletta IUD placement  Surgeon: Standley Dakins. Kennon Rounds, MD  Anesthesia: Ernest Haber, MD, paracervical block  Findings: fluffy endometrium  Fluid deficit: 350cc  Estimated blood loss: Minimum  Pathology: Endometrial curettings  Indications for procedure: 63 y.o. G0P0000 with history of complicated medical issues who presents with postmenopausal bleeding. Noted to have a thickened endometrium. Failed office sampling.  Procedure: The patient was taken to the operating room where general analgesia was inserted. SCDs were in place. Time out was performed. Patient was placed in dorsolithotomy in [place]. She was prepped and draped in the usual sterile fashion. A Red Rubber catheter was used to drain her bladder. A speculum was placeed in the vagina. The cervix was visualized anteriorly and grasped with a single-tooth tenaculum. Paracervical block was performed with 1% lidocaine plain with 20 cc injected. Sequential dilation was performed with Kennon Rounds dilators, beginning with os finder. Uterus with severe anteversion and difficulty getting to endometrial cavity.  The hysteroscope was inserted and the endometrial cavity and inspected. There were above findings noted. The Myosure used to get sampling of tissue. The hysteroscope was removed. Liletta IUD placed per [employer] instruction. All instruments were removed from the vagina. All instrument, needle and lap counts were correct x2. The patient was awakened and is recovering in stable condition.  [name] MD 10/11/2018 2:44 PM

## 2018-10-11 NOTE — Discharge Instructions (Signed)
Hysteroscopy, Care After  This sheet gives you information about how to care for yourself after your procedure. Your health care provider may also give you more specific instructions. If you have problems or questions, contact your health care provider.  What can I expect after the procedure?  After the procedure, it is common to have:  · Cramping.  · Bleeding. This can vary from light spotting to menstrual-like bleeding.  Follow these instructions at home:  Activity  · Rest for 1-2 days after the procedure.  · Do not douche, use tampons, or have sex for 2 weeks after the procedure, or until your health care provider approves.  · Do not drive for 24 hours after the procedure, or for as long as told by your health care provider.  · Do not drive, use heavy machinery, or drink alcohol while taking prescription pain medicines.  Medicines    · Take over-the-counter and prescription medicines only as told by your health care provider.  · Do not take aspirin during recovery. It can increase the risk of bleeding.  General instructions  · Do not take baths, swim, or use a hot tub until your health care provider approves. Take showers instead of baths for 2 weeks, or for as long as told by your health care provider.  · To prevent or treat constipation while you are taking prescription pain medicine, your health care provider may recommend that you:  ? Drink enough fluid to keep your urine clear or pale yellow.  ? Take over-the-counter or prescription medicines.  ? Eat foods that are high in fiber, such as fresh fruits and vegetables, whole grains, and beans.  ? Limit foods that are high in fat and processed sugars, such as fried and sweet foods.  · Keep all follow-up visits as told by your health care provider. This is important.  Contact a health care provider if:  · You feel dizzy or lightheaded.  · You feel nauseous.  · You have abnormal vaginal discharge.  · You have a rash.  · You have pain that does not get better with  medicine.  · You have chills.  Get help right away if:  · You have bleeding that is heavier than a normal menstrual period.  · You have a fever.  · You have pain or cramps that get worse.  · You develop new abdominal pain.  · You faint.  · You have pain in your shoulders.  · You have shortness of breath.  Summary  · After the procedure, you may have cramping and some vaginal bleeding.  · Do not douche, use tampons, or have sex for 2 weeks after the procedure, or until your health care provider approves.  · Do not take baths, swim, or use a hot tub until your health care provider approves. Take showers instead of baths for 2 weeks, or for as long as told by your health care provider.  · Report any unusual symptoms to your health care provider.  · Keep all follow-up visits as told by your health care provider. This is important.  This information is not intended to replace advice given to you by your health care provider. Make sure you discuss any questions you have with your health care provider.  Document Released: 05/09/2013 Document Revised: 08/17/2016 Document Reviewed: 08/17/2016  Elsevier Interactive Patient Education © 2019 [employer]

## 2018-10-11 NOTE — Anesthesia Postprocedure Evaluation (Signed)
Anesthesia Post Note  Patient: [name]  Procedure(s) Performed: DILATATION AND CURETTAGE /HYSTEROSCOPY (N/A Vagina ) INTRAUTERINE DEVICE (IUD) INSERTION (Cervix)     Patient location during evaluation: PACU Anesthesia Type: General Level of consciousness: awake and alert Pain management: pain level controlled Vital Signs Assessment: post-procedure vital signs reviewed and stable Respiratory status: spontaneous breathing, nonlabored ventilation, respiratory function stable and patient connected to nasal cannula oxygen Cardiovascular status: blood pressure returned to baseline and stable Postop Assessment: no apparent nausea or vomiting Anesthetic complications: no    Last Vitals:  Vitals:   10/11/18 1041 10/11/18 1440  BP: (!) 193/87 137/62  Pulse: 68 73  Resp: 18 16  Temp: 36.8 C 36.9 C  SpO2: 96% 100%    Last Pain:  Vitals:   10/11/18 1440  TempSrc:   PainSc: 0-No pain                 Arren Laminack S

## 2018-10-11 NOTE — Anesthesia Procedure Notes (Signed)
Procedure Name: Intubation Date/Time: 10/11/2018 1:45 PM Performed by: Lissa Morales, CRNA Pre-anesthesia Checklist: Patient identified, Emergency Drugs available, Suction available and Patient being monitored Patient Re-evaluated:Patient Re-evaluated prior to induction Oxygen Delivery Method: Circle system utilized Preoxygenation: Pre-oxygenation with 100% oxygen Induction Type: IV induction Ventilation: Mask ventilation without difficulty Laryngoscope Size: Mac and 4 Grade View: Grade II Tube type: Oral Tube size: 7.5 mm Number of attempts: 1 Airway Equipment and Method: Stylet and Oral airway Placement Confirmation: ETT inserted through vocal cords under direct vision,  positive ETCO2 and breath sounds checked- equal and bilateral Secured at: 22 cm Tube secured with: Tape Dental Injury: Teeth and Oropharynx as per pre-operative assessment

## 2018-10-12 ENCOUNTER — Encounter (HOSPITAL_COMMUNITY): Payer: Self-pay | Admitting: Family Medicine

## 2018-10-19 ENCOUNTER — Other Ambulatory Visit: Payer: Self-pay | Admitting: Emergency Medicine

## 2018-10-19 ENCOUNTER — Telehealth: Payer: Self-pay | Admitting: Emergency Medicine

## 2018-10-19 MED ORDER — MEGESTROL ACETATE 40 MG PO TABS: 40.0000 mg | ORAL_TABLET | Freq: Two times a day (BID) | ORAL | 1 refills | Status: DC

## 2018-10-19 NOTE — Telephone Encounter (Signed)
Pt called the front office and inquired about rescheduling her appointment on 3/18. Pt stated her abdominal cramps were better but the bleeding was the same since her procedure on 3/11. Pt stated "I overall feel better since the procedure and I just don't feel comfortable coming tomorrow".   After discussing pt concerns with Dr Kennon Rounds, it was advised for pt to reschedule her appointment for May. Pt was also ordered a prescription for Megace to assist with the bleeding.   Pt verbalized understanding and had no further questions.

## 2018-10-19 NOTE — Progress Notes (Signed)
Pt sent a prescription for Megace 40 mg twice a day. 60 tablets total with one refill.

## 2018-10-20 ENCOUNTER — Ambulatory Visit: Payer: Medicare Other | Admitting: Family Medicine

## 2018-10-22 ENCOUNTER — Other Ambulatory Visit: Payer: Self-pay | Admitting: Internal Medicine

## 2018-10-23 ENCOUNTER — Other Ambulatory Visit: Payer: Self-pay | Admitting: Internal Medicine

## 2018-10-23 DIAGNOSIS — Z794 Long term (current) use of insulin: Principal | ICD-10-CM

## 2018-10-23 DIAGNOSIS — E118 Type 2 diabetes mellitus with unspecified complications: Secondary | ICD-10-CM

## 2018-10-25 ENCOUNTER — Telehealth: Payer: Self-pay | Admitting: Internal Medicine

## 2018-10-25 NOTE — Telephone Encounter (Signed)
Pt called I stating she got filled her insulin lispro protamine-lispro (HUMALOG MIX 75/25) (75-25) 100 UNIT/ML SUSP injection she states she only got 3 viles she needs to get 4 please states she runs out to soon pharmacy of choice : [facility], [address]  please follow up

## 2018-10-26 NOTE — Telephone Encounter (Signed)
Based on how the insulin was prescribed 3 vials is a one month supply. If she is using more insulin that should be discussed with her doctor.

## 2018-10-26 NOTE — Telephone Encounter (Signed)
Called pt back informed her that she was only prescribed 3 vials 1 month supply she stated that she had spoken to her dr about this situation and her DR was aware that she currently takes 50 units twice day

## 2018-10-27 ENCOUNTER — Encounter: Payer: Self-pay | Admitting: *Deleted

## 2018-10-30 ENCOUNTER — Other Ambulatory Visit: Payer: Self-pay

## 2018-10-30 DIAGNOSIS — E876 Hypokalemia: Secondary | ICD-10-CM

## 2018-10-30 MED ORDER — POTASSIUM CHLORIDE CRYS ER 20 MEQ PO TBCR: EXTENDED_RELEASE_TABLET | ORAL | 3 refills | Status: DC

## 2018-10-31 ENCOUNTER — Other Ambulatory Visit: Payer: Self-pay | Admitting: Pharmacist

## 2018-10-31 DIAGNOSIS — Z794 Long term (current) use of insulin: Principal | ICD-10-CM

## 2018-10-31 DIAGNOSIS — E118 Type 2 diabetes mellitus with unspecified complications: Secondary | ICD-10-CM

## 2018-10-31 MED ORDER — INSULIN LISPRO PROT & LISPRO (75-25 MIX) 100 UNIT/ML ~~LOC~~ SUSP: SUBCUTANEOUS | 0 refills | Status: DC

## 2018-11-03 DIAGNOSIS — M170 Bilateral primary osteoarthritis of knee: Secondary | ICD-10-CM | POA: Diagnosis not present

## 2018-11-03 DIAGNOSIS — Z9181 History of falling: Secondary | ICD-10-CM | POA: Diagnosis not present

## 2018-11-03 DIAGNOSIS — R269 Unspecified abnormalities of gait and mobility: Secondary | ICD-10-CM | POA: Diagnosis not present

## 2018-11-03 DIAGNOSIS — R079 Chest pain, unspecified: Secondary | ICD-10-CM | POA: Diagnosis not present

## 2018-11-13 ENCOUNTER — Other Ambulatory Visit: Payer: Self-pay | Admitting: Cardiovascular Disease

## 2018-11-24 ENCOUNTER — Telehealth: Payer: Self-pay | Admitting: Cardiovascular Disease

## 2018-11-24 NOTE — Telephone Encounter (Signed)
LVM to schedule recall. °

## 2018-11-28 ENCOUNTER — Telehealth: Payer: Self-pay | Admitting: *Deleted

## 2018-11-28 NOTE — Telephone Encounter (Signed)
11/28/18 LMOM @ 0945 am,re: follow appointment.

## 2018-12-03 DIAGNOSIS — R269 Unspecified abnormalities of gait and mobility: Secondary | ICD-10-CM | POA: Diagnosis not present

## 2018-12-03 DIAGNOSIS — R079 Chest pain, unspecified: Secondary | ICD-10-CM | POA: Diagnosis not present

## 2018-12-03 DIAGNOSIS — M170 Bilateral primary osteoarthritis of knee: Secondary | ICD-10-CM | POA: Diagnosis not present

## 2018-12-03 DIAGNOSIS — Z9181 History of falling: Secondary | ICD-10-CM | POA: Diagnosis not present

## 2018-12-06 ENCOUNTER — Telehealth: Payer: Self-pay | Admitting: Cardiovascular Disease

## 2018-12-06 NOTE — Telephone Encounter (Signed)
error 

## 2018-12-18 ENCOUNTER — Other Ambulatory Visit: Payer: Self-pay | Admitting: Internal Medicine

## 2018-12-18 DIAGNOSIS — I2510 Atherosclerotic heart disease of native coronary artery without angina pectoris: Secondary | ICD-10-CM

## 2018-12-19 ENCOUNTER — Other Ambulatory Visit: Payer: Self-pay | Admitting: Internal Medicine

## 2018-12-19 DIAGNOSIS — E1143 Type 2 diabetes mellitus with diabetic autonomic (poly)neuropathy: Secondary | ICD-10-CM

## 2018-12-19 DIAGNOSIS — K3184 Gastroparesis: Secondary | ICD-10-CM

## 2018-12-28 ENCOUNTER — Emergency Department (HOSPITAL_COMMUNITY): Payer: Medicare Other

## 2018-12-28 ENCOUNTER — Other Ambulatory Visit: Payer: Self-pay

## 2018-12-28 ENCOUNTER — Encounter (HOSPITAL_COMMUNITY): Payer: Self-pay | Admitting: Emergency Medicine

## 2018-12-28 ENCOUNTER — Emergency Department (HOSPITAL_COMMUNITY)
Admission: EM | Admit: 2018-12-28 | Discharge: 2018-12-28 | Disposition: A | Discharge status: Home / Self Care | Payer: Medicare Other | Attending: Emergency Medicine | Admitting: Emergency Medicine

## 2018-12-28 DIAGNOSIS — N183 Chronic kidney disease, stage 3 (moderate): Secondary | ICD-10-CM | POA: Insufficient documentation

## 2018-12-28 DIAGNOSIS — I5032 Chronic diastolic (congestive) heart failure: Secondary | ICD-10-CM | POA: Insufficient documentation

## 2018-12-28 DIAGNOSIS — I2510 Atherosclerotic heart disease of native coronary artery without angina pectoris: Secondary | ICD-10-CM | POA: Insufficient documentation

## 2018-12-28 DIAGNOSIS — I252 Old myocardial infarction: Secondary | ICD-10-CM | POA: Insufficient documentation

## 2018-12-28 DIAGNOSIS — I130 Hypertensive heart and chronic kidney disease with heart failure and stage 1 through stage 4 chronic kidney disease, or unspecified chronic kidney disease: Secondary | ICD-10-CM | POA: Insufficient documentation

## 2018-12-28 DIAGNOSIS — R300 Dysuria: Secondary | ICD-10-CM | POA: Insufficient documentation

## 2018-12-28 DIAGNOSIS — Z79899 Other long term (current) drug therapy: Secondary | ICD-10-CM | POA: Insufficient documentation

## 2018-12-28 DIAGNOSIS — Z7982 Long term (current) use of aspirin: Secondary | ICD-10-CM | POA: Diagnosis not present

## 2018-12-28 DIAGNOSIS — R1032 Left lower quadrant pain: Secondary | ICD-10-CM | POA: Diagnosis not present

## 2018-12-28 DIAGNOSIS — R197 Diarrhea, unspecified: Secondary | ICD-10-CM | POA: Diagnosis not present

## 2018-12-28 DIAGNOSIS — Z794 Long term (current) use of insulin: Secondary | ICD-10-CM | POA: Diagnosis not present

## 2018-12-28 DIAGNOSIS — R110 Nausea: Secondary | ICD-10-CM | POA: Diagnosis not present

## 2018-12-28 DIAGNOSIS — E1122 Type 2 diabetes mellitus with diabetic chronic kidney disease: Secondary | ICD-10-CM | POA: Diagnosis not present

## 2018-12-28 LAB — COMPREHENSIVE METABOLIC PANEL
ALT: 14 U/L (ref 0–44)
AST: 14 U/L — ABNORMAL LOW (ref 15–41)
Albumin: 3.5 g/dL (ref 3.5–5.0)
Alkaline Phosphatase: 60 U/L (ref 38–126)
Anion gap: 9 (ref 5–15)
BUN: 15 mg/dL (ref 8–23)
CO2: 20 mmol/L — ABNORMAL LOW (ref 22–32)
Calcium: 8.6 mg/dL — ABNORMAL LOW (ref 8.9–10.3)
Chloride: 112 mmol/L — ABNORMAL HIGH (ref 98–111)
Creatinine, Ser: 1.24 mg/dL — ABNORMAL HIGH (ref 0.44–1.00)
GFR calc Af Amer: 53 mL/min — ABNORMAL LOW (ref >60)
GFR calc non Af Amer: 46 mL/min — ABNORMAL LOW (ref >60)
Glucose, Bld: 187 mg/dL — ABNORMAL HIGH (ref 70–99)
Potassium: 3.4 mmol/L — ABNORMAL LOW (ref 3.5–5.1)
Sodium: 141 mmol/L (ref 135–145)
Total Bilirubin: 0.5 mg/dL (ref 0.3–1.2)
Total Protein: 7.8 g/dL (ref 6.5–8.1)

## 2018-12-28 LAB — URINALYSIS, ROUTINE W REFLEX MICROSCOPIC
Bilirubin Urine: NEGATIVE
Glucose, UA: NEGATIVE mg/dL
Hgb urine dipstick: NEGATIVE
Ketones, ur: NEGATIVE mg/dL
Leukocytes,Ua: NEGATIVE
Nitrite: NEGATIVE
Protein, ur: NEGATIVE mg/dL
Specific Gravity, Urine: 1.014 (ref 1.005–1.030)
pH: 6 (ref 5.0–8.0)

## 2018-12-28 LAB — LIPASE, BLOOD: Lipase: 35 U/L (ref 11–51)

## 2018-12-28 LAB — CBC WITH DIFFERENTIAL/PLATELET
Abs Immature Granulocytes: 0.03 10*3/uL (ref 0.00–0.07)
Basophils Absolute: 0 10*3/uL (ref 0.0–0.1)
Basophils Relative: 0 %
Eosinophils Absolute: 0.1 10*3/uL (ref 0.0–0.5)
Eosinophils Relative: 1 %
HCT: 35.9 % — ABNORMAL LOW (ref 36.0–46.0)
Hemoglobin: 11.4 g/dL — ABNORMAL LOW (ref 12.0–15.0)
Immature Granulocytes: 0 %
Lymphocytes Relative: 19 %
Lymphs Abs: 2.1 10*3/uL (ref 0.7–4.0)
MCH: 29.8 pg (ref 26.0–34.0)
MCHC: 31.8 g/dL (ref 30.0–36.0)
MCV: 94 fL (ref 80.0–100.0)
Monocytes Absolute: 1 10*3/uL (ref 0.1–1.0)
Monocytes Relative: 9 %
Neutro Abs: 7.7 10*3/uL (ref 1.7–7.7)
Neutrophils Relative %: 71 %
Platelets: 205 10*3/uL (ref 150–400)
RBC: 3.82 MIL/uL — ABNORMAL LOW (ref 3.87–5.11)
RDW: 15.4 % (ref 11.5–15.5)
WBC: 10.8 10*3/uL — ABNORMAL HIGH (ref 4.0–10.5)
nRBC: 0 % (ref 0.0–0.2)

## 2018-12-28 MED ORDER — SODIUM CHLORIDE 0.9 % IV BOLUS
500.0000 mL | Freq: Once | INTRAVENOUS | Status: AC
  Administered 2018-12-28: 16:00:00 500 mL via INTRAVENOUS

## 2018-12-28 NOTE — ED Provider Notes (Signed)
[facility] DEPT Provider Note   CSN: [id number] Arrival date & time: 12/28/18  1340    History   Chief Complaint Chief Complaint  Patient presents with  . Diarrhea  . Dysuria    HPI [name] is a 63 y.o. female.     The history is provided by the patient.  Abdominal Pain  Pain location:  LLQ and suprapubic Pain quality: aching, cramping and dull   Pain severity:  Mild Onset quality:  Gradual Timing:  Intermittent Progression:  Waxing and waning Chronicity:  New Context: not eating and not suspicious food intake   Relieved by:  Nothing Worsened by:  Nothing Associated symptoms: diarrhea, dysuria and nausea   Associated symptoms: no chest pain, no chills, no cough, no fever, no hematuria, no shortness of breath, no sore throat, no vaginal discharge and no vomiting     Past Medical History:  Diagnosis Date  . Blind right eye   . Chronic back pain    "my whole back" (03/18/2015)  . Chronic chest pain    takes isosorbide  . Chronic diastolic heart failure ([place]) 9/13   echo 03/20/15 LV Ef of 60-65%, grade 2DD and PA peak pressure: 36 mm Hg   . CKD (chronic kidney disease), stage III ([place])   . Coronary artery disease cardiologist-- dr croitoru   per cardiac cath 02-07-2009 -- diffuse 3V CAD involving RCA, CFx, and D1,  pt not a candidate for bypass surgery or angioplasty,  medical management  . Diabetic neuropathy ([place])   . Diabetic retinopathy ([place])    bilateral proliferative  . Diastolic CHF, chronic ([place])   . Dyspnea    "always"  . Edema of both lower extremities   . Generalized weakness   . GERD (gastroesophageal reflux disease)   . Glaucoma, both eyes   . History of non-ST elevation myocardial infarction (NSTEMI)    02-25-2009;  05-15-2009;  01-08-2010;  04-04-2012 this MI was post-op surgery for abscess on 04-03-2012  . History of sepsis   . Hyperlipemia   . Hypertension   . Insulin dependent type 2 diabetes mellitus  ([place])    followed by pcp  . Iron deficiency anemia due to chronic blood loss    uterine bleeding  . Legally blind in left eye, as defined in [place]    per pt states vision "is foggy"  . Mild obstructive sleep apnea    study in epic 04-19-2012 mild osa,  recommendation's were wt. loss, dental  appliance, surgery, or cpap  . Morbid obesity with BMI of 40.0-44.9, adult ([place])   . OA (osteoarthritis)    knees  . PSVT (paroxysmal supraventricular tachycardia) ([place])   . Renal insufficiency    pt. denies  . Seasonal allergies   . Wears glasses     Patient Active Problem List   Diagnosis Date Noted  . Legally blind in right eye, as defined in [place] 08/11/2018  . Bilateral carpal tunnel syndrome 04/13/2018  . Adenomatous polyp of colon 04/13/2018  . Fibrocystic breast changes, right 12/12/2017  . Drug-induced constipation 09/15/2017  . Primary osteoarthritis of both knees 04/05/2017  . Chronic bilateral low back pain 03/07/2017  . Chronic pain of right knee 03/07/2017  . Vitamin B12 deficiency 12/09/2016  . Incidental lung nodule, > 61m and < 845m02/13/2018  . Vitreous hemorrhage of right eye ([facility]  . Diabetic gastroparesis associated with type 2 diabetes mellitus ([place]02/05/2016  . Controlled type 2 diabetes mellitus  with complication, with long-term current use of insulin ([place]) 09/12/2015  . Chronic renal insufficiency, stage III (moderate) ([place]) 03/19/2015  . Prolonged Q-T interval on ECG 08/10/2013  . Dyslipidemia 04/02/2013  . Chronic diastolic heart failure, NYHA class 3 ([place]) 04/07/2012  . Presumed OSA (obstructive sleep apnea) 04/06/2012  . NSTEMI  post-op 04/04/12-medical Rx 04/04/2012  . Severe obesity (BMI >= 40) ([place]) 01/17/2012  . GERD 08/21/2009  . Post-menopausal bleeding 03/20/2009  . PERIPHERAL EDEMA 03/20/2009  . Coronary atherosclerosis-not a surgical or PCI candidate 2010 02/27/2009  . Proliferative diabetic retinopathy ([place]) 10/07/2007  . DETACHED RETINA, BILATERAL,  HX OF 09/07/2007  . History of chronic Iron defeicency anemia with acute post op anemia, transfused after debridment 04/04/12 04/17/2007  . Essential hypertension 04/17/2007    Past Surgical History:  Procedure Laterality Date  . BIOPSY  02/13/2018   Procedure: BIOPSY;  Surgeon: Ladene Artist, MD;  Location: [facility];  Service: Endoscopy;;  . BREAST SURGERY Right 2019    breast biopsy,  per pt benign  . CARDIAC CATHETERIZATION  02-27-2009   dr al little   diffuse 3V CAD , involving RCA, CFx, and D1;  inferior wall abnormalities, low normal ef 50%  . CATARACT EXTRACTION Right   . CATARACT EXTRACTION W/ INTRAOCULAR LENS IMPLANT Left 10-11-2007   _0   . COLONOSCOPY WITH PROPOFOL N/A 02/13/2018   Procedure: COLONOSCOPY WITH PROPOFOL;  Surgeon: Ladene Artist, MD;  Location: [facility];  Service: Endoscopy;  Laterality: N/A;  . DILATION AND CURETTAGE OF UTERUS  x2 last one 2000  . EYE SURGERY Bilateral 2009 to 2010   x2  left eye;  x2  right eye   . HYSTEROSCOPY W/D&C N/A 10/11/2018   Procedure: DILATATION AND CURETTAGE /HYSTEROSCOPY;  Surgeon: [name], MD;  Location: [facility];  Service: Gynecology;  Laterality: N/A;  . INCISION AND DRAINAGE PERIRECTAL ABSCESS  04/03/2012  . INTRAUTERINE DEVICE (IUD) INSERTION  10/11/2018   Procedure: INTRAUTERINE DEVICE (IUD) INSERTION;  Surgeon: [name], MD;  Location: [facility];  Service: Gynecology;;  . IR US GUIDE [place] LEFT  02/13/2018  . IR VENIPUNCTURE 49YRS/OLDER BY MD  02/13/2018  . POLYPECTOMY  02/13/2018   Procedure: POLYPECTOMY;  Surgeon: Ladene Artist, MD;  Location: [facility];  Service: Endoscopy;;     OB History    Gravida  0   Para  0   Term  0   Preterm  0   AB  0   Living  0     SAB  0   TAB  0   Ectopic  0   Multiple  0   Live Births  0            Home Medications    Prior to Admission medications   Medication Sig Start Date End Date Taking? Authorizing Provider  ACCU-CHEK SOFTCLIX  LANCETS lancets Use as instructed for 3 times daily blood glucose monitoring 09/29/18   Ladell Pier, MD  acetaminophen (TYLENOL) 500 MG tablet Take 1 tablet (500 mg total) by mouth every 6 (six) hours as needed. Patient taking differently: Take 500 mg by mouth every 6 (six) hours as needed (pain.).  09/09/16   Clent Demark, PA-C  Acetaminophen-Caff-Pyrilamine (MIDOL COMPLETE PO) Take 1 tablet by mouth every 6 (six) hours as needed (cramps).    [provider]  acetaZOLAMIDE (DIAMOX) 500 MG capsule Take 500 mg by mouth 2 (two) times daily.    [provider]  aspirin EC 81 MG tablet Take 81 mg by mouth daily.    [provider]  atorvastatin (LIPITOR) 80 MG tablet TAKE 1 TABLET BY MOUTH ONCE DAILY IN THE EVENING AT 6PM 12/19/18   Ladell Pier, MD  baclofen (LIORESAL) 10 MG tablet Take 1 tablet (10 mg total) by mouth at bedtime as needed for muscle spasms. Patient taking differently: Take 10 mg by mouth daily as needed for muscle spasms.  09/28/16   Funches, Adriana Mccallum, MD  bismuth subsalicylate (PEPTO BISMOL) 262 MG/15ML suspension Take 30 mLs by mouth every 6 (six) hours as needed for indigestion.    [provider]  Blood Glucose Monitoring Suppl (ACCU-CHEK AVIVA PLUS) w/Device KIT Used as directed 09/29/18   Ladell Pier, MD  Blood Pressure Monitoring (BLOOD PRESSURE MONITOR/ARM) DEVI 1 each by Does not apply route daily. ICD 10, I10 and I50.32 Patient not taking: Reported on 09/19/2018 03/07/17   Boykin Nearing, MD  brimonidine-timolol (COMBIGAN) 0.2-0.5 % ophthalmic solution Place 1 drop into both eyes 2 (two) times daily.     [provider]  calcium carbonate (TUMS - DOSED IN MG ELEMENTAL CALCIUM) 500 MG chewable tablet Chew 1 tablet by mouth 3 (three) times daily as needed for indigestion.     [provider]  Cyanocobalamin (VITAMIN B-12) 1000 MCG TABS Take 1,000 mcg by mouth daily. 09/15/17   Ladell Pier, MD   diltiazem (DILACOR XR) 240 MG 24 hr capsule Take 1 capsule (240 mg total) by mouth daily. 09/15/17   Ladell Pier, MD  docusate sodium (COLACE) 100 MG capsule Take 1 capsule (100 mg total) by mouth 2 (two) times daily. Patient taking differently: Take 100 mg by mouth 2 (two) times daily as needed (CONSTIPATION).  09/15/17   Ladell Pier, MD  ferrous sulfate 325 (65 FE) MG tablet Take 325 mg by mouth daily with breakfast.    [provider]  furosemide (LASIX) 40 MG tablet Take 1 tablet (40 mg total) by mouth 2 (two) times daily. 06/10/18   Ladell Pier, MD  furosemide (LASIX) 40 MG tablet Take 120 mg by mouth 2 (two) times daily. Take 14m tablet with 888mtablet to equal 12069mID    [provider]  furosemide (LASIX) 80 MG tablet TAKE ONE 80MG TABLET BY  MOUTH TWO TIMES DAILY WITH  FUROSEMIDE 40MG (TOTAL DOSE OF 120MG TWO TIMES DAILY ) 10/23/18   JohLadell PierD  glucose blood test strip Use TID before meals / Dx E11.9 09/29/18   JohLadell PierD  glucose monitoring kit (FREESTYLE) monitoring kit 1 each by Does not apply route 4 (four) times daily - after meals and at bedtime. Patient not taking: Reported on 09/19/2018 09/12/15   FunBoykin NearingD  insulin lispro protamine-lispro (HUMALOG MIX 75/25) (75-25) 100 UNIT/ML SUSP injection INJECT 50 UNITS SUBCUTANEOUSLY TWICE DAILY WITH A MEAL 10/31/18   JohLadell PierD  isosorbide mononitrate (IMDUR) 120 MG 24 hr tablet TAKE 1 TABLET BY MOUTH ONCE DAILY 12/19/18   JohLadell PierD  levonorgestrel (MIRENA) 20 MCG/24HR IUD 1 Intra Uterine Device (1 each total) by Intrauterine route once for 1 dose. 08/08/18 08/08/18  PraDonnamae JudeD  losartan (COZAAR) 50 MG tablet Take 2 tablets (100 mg total) by mouth daily. 09/29/18   JohLadell PierD  LUMIGAN 0.01 % SOLN Place 1 drop into the right eye at bedtime.  03/06/14   [provider]  megestrol (MEGACE) 40 MG tablet Take 1 tablet (40 mg total)  by mouth 2 (two) times daily. 10/19/18   [name], MD  Menthol-Methyl Salicylate (MUSCLE RUB EX) Apply 1 application topically daily as needed (knee pain).     [provider]  metoCLOPramide (REGLAN) 5 MG tablet TAKE 1 TABLET BY MOUTH TWICE DAILY BEFORE  THE  TWO  LARGE  MEALS  OF  THE  DAY 12/19/18   Ladell Pier, MD  metolazone (ZAROXOLYN) 2.5 MG tablet 1 tab once every two weeks PRN swelling lower extremities Patient not taking: Reported on 09/19/2018 09/15/17   Ladell Pier, MD  metoprolol (TOPROL-XL) 200 MG 24 hr tablet Take 1 tablet by mouth once daily 11/13/18   Croitoru, Dani Gobble, MD  nitroGLYCERIN (NITROSTAT) 0.4 MG SL tablet DISSOLVE ONE TABLET UNDER THE TONGUE EVERY 5 MINUTES AS NEEDED FOR CHEST PAIN.  DO NOT EXCEED A TOTAL OF 3 DOSES IN 15 MINUTES NOW Patient taking differently: Place 0.4 mg under the tongue every 5 (five) minutes as needed for chest pain.  10/26/17   Croitoru, Mihai, MD  pantoprazole (PROTONIX) 40 MG tablet TAKE ONE TABLET BY MOUTH ONCE DAILY Patient taking differently: Take 40 mg by mouth daily.  01/13/16   Funches, Adriana Mccallum, MD  potassium chloride SA (K-DUR,KLOR-CON) 20 MEQ tablet 2 tabs PO in a.m and 1 tab PO Q p.m 10/30/18   Croitoru, Mihai, MD  RELION INSULIN SYRINGE 31G X 15/64" 1 ML MISC USE AS DIRECTED TWICE DAILY INSULIN ADMINISTRATION 08/17/18   Ladell Pier, MD    Family History Family History  Problem Relation Age of Onset  . Diabetes Mother   . Heart disease Mother   . Heart attack Mother   . Hypertension Mother   . Breast cancer Mother   . Colon polyps Father   . Diabetes Father   . Heart disease Father   . Heart attack Father   . Stroke Father   . Hypertension Father   . Diabetes Brother   . Diabetes Sister   . Heart disease Brother   . Heart disease Sister   . Diabetes Maternal Grandmother   . Hypertension Maternal Grandmother   . Hypertension Brother   . Hypertension Sister   . Breast cancer Cousin   . Colon  cancer Cousin   . Esophageal cancer Neg Hx   . Rectal cancer Neg Hx   . Stomach cancer Neg Hx     Social History Social History   Tobacco Use  . Smoking status: Never Smoker  . Smokeless tobacco: Never Used  Substance Use Topics  . Alcohol use: No  . Drug use: Never     Allergies   Ativan [lorazepam] and Orange fruit [citrus]   Review of Systems Review of Systems  Constitutional: Negative for chills and fever.  HENT: Negative for ear pain and sore throat.   Eyes: Negative for pain and visual disturbance.  Respiratory: Negative for cough and shortness of breath.   Cardiovascular: Negative for chest pain and palpitations.  Gastrointestinal: Positive for abdominal pain, diarrhea and nausea. Negative for vomiting.  Genitourinary: Positive for dysuria. Negative for hematuria and vaginal discharge.  Musculoskeletal: Negative for arthralgias and back pain.  Skin: Negative for color change and rash.  Neurological: Negative for seizures and syncope.  All other systems reviewed and are negative.    Physical Exam Updated Vital Signs BP (!) 152/79 (BP Location: Left Arm)   Pulse 70  Temp 99 F (37.2 C) (Oral)   Resp 16   Ht _0  (1.6 m)   Wt 128.4 kg   LMP 04/02/2012   SpO2 100%   BMI 50.13 kg/m   Physical Exam Vitals signs and nursing note reviewed.  Constitutional:      General: She is not in acute distress.    Appearance: She is well-developed. She is not ill-appearing.  HENT:     Head: Normocephalic and atraumatic.     Nose: Nose normal.     Mouth/Throat:     Mouth: Mucous membranes are moist.  Eyes:     Extraocular Movements: Extraocular movements intact.     Conjunctiva/sclera: Conjunctivae normal.     Pupils: Pupils are equal, round, and reactive to light.  Neck:     Musculoskeletal: Normal range of motion and neck supple.  Cardiovascular:     Rate and Rhythm: Normal rate and regular rhythm.     Pulses: Normal pulses.     Heart sounds: Normal heart  sounds. No murmur.  Pulmonary:     Effort: Pulmonary effort is normal. No respiratory distress.     Breath sounds: Normal breath sounds.  Abdominal:     General: There is no distension.     Palpations: Abdomen is soft.     Tenderness: There is abdominal tenderness (mild to suprapubic, LLQ).  Skin:    General: Skin is warm and dry.     Capillary Refill: Capillary refill takes less than 2 seconds.  Neurological:     General: No focal deficit present.     Mental Status: She is alert.  Psychiatric:        Mood and Affect: Mood normal.      ED Treatments / Results  Labs (all labs ordered are listed, but only abnormal results are displayed) Labs Reviewed  COMPREHENSIVE METABOLIC PANEL - Abnormal; Notable for the following components:      Result Value   Potassium 3.4 (*)    Chloride 112 (*)    CO2 20 (*)    Glucose, Bld 187 (*)    Creatinine, Ser 1.24 (*)    Calcium 8.6 (*)    AST 14 (*)    GFR calc non Af Amer 46 (*)    GFR calc Af Amer 53 (*)    All other components within normal limits  URINE CULTURE  LIPASE, BLOOD  URINALYSIS, ROUTINE W REFLEX MICROSCOPIC  CBC WITH DIFFERENTIAL/PLATELET  CBC WITH DIFFERENTIAL/PLATELET    EKG None  Radiology Ct Abdomen Pelvis Wo Contrast  Result Date: 12/28/2018 CLINICAL DATA:  Abdominal pain, suspect colitis. Hysteroscopy with D and C 10/11/2018. IUD EXAM: CT ABDOMEN AND PELVIS WITHOUT CONTRAST TECHNIQUE: Multidetector CT imaging of the abdomen and pelvis was performed following the standard protocol without IV contrast. COMPARISON:  Ultrasound pelvis 12/09/2015, CT abdomen pelvis 04/03/2012 FINDINGS: Lower chest: Lung bases clear bilaterally.  Cardiac enlargement. Hepatobiliary: No focal liver abnormality is seen. No gallstones, gallbladder wall thickening, or biliary dilatation. Pancreas: Negative Spleen: Negative Adrenals/Urinary Tract: Adrenal glands are unremarkable. Kidneys are normal, without renal calculi, focal lesion, or  hydronephrosis. Bladder is unremarkable. Stomach/Bowel: Negative for bowel obstruction or ileus. No bowel wall edema or mass. Appendix not visualized. Vascular/Lymphatic: Mild atherosclerotic aorta. Negative for aneurysm. No enlarged lymph nodes. Reproductive: Enlarged uterus. Scattered small calcifications the uterus due to fibroid uterus. Largest fibroid left fundus measures 3.8 x 6.7 cm with mild enlargement since 2013. Right adnexal cyst 30 x 50 mm. IUD in  the anterior fundus to the right of midline. Other: Negative for free fluid. Musculoskeletal: No acute skeletal abnormality. IMPRESSION: No acute abnormality. Negative for colitis. Appendix not visualized Enlarged uterus with fibroid changes. Right adnexal cyst 30 x 50 mm. IUD in the anterior fundus on the right. Electronically Signed   By: Franchot Gallo M.D.   On: 12/28/2018 16:39    Procedures Procedures (including critical care time)  Medications Ordered in ED Medications  sodium chloride 0.9 % bolus 500 mL (500 mLs Intravenous New Bag/Given 12/28/18 1550)     Initial Impression / Assessment and Plan / ED Course  I have reviewed the triage vital signs and the nursing notes.  Pertinent labs & imaging results that were available during my care of the patient were reviewed by me and considered in my medical decision making (see chart for details).     Samiah ADINA PUZZO is a 63 year old female history of hypertension, high cholesterol, morbid obesity, CKD, heart failure who presents the ED with diarrhea, pain with urination.  Patient with unremarkable vitals.  No fever.  Patient with symptoms for the last several days.  No suspicious food intake.  Patient describes mostly watery diarrhea.  Denies any vaginal discharge.  Has had some abdominal cramping.  Overall no specific tenderness on abdominal exam.  She is well-appearing.  She is concerned for dehydration.  No recent antibiotics or travel.  Will get lab work to rule out electrolyte  abnormalities, dehydration.  Will get urinalysis.  Low concern for intra-abdominal process such as appendicitis or other infectious process or surgical process at this time.  However, given symptoms will get CT scan to rule out colitis.  CT scan showed no acute findings.  Patient with fibroid.  No signs urinary tract infection.  No significant electrolyte abnormality, kidney injury.  Lipase normal.  No significant anemia.  Overall patient feels improved.  Given reassurance.  Recommend increase hydration.  Likely viral process.  Discharged from ED in good condition.  This chart was dictated using voice recognition software.  Despite best efforts to proofread,  errors can occur which can change the documentation meaning.    Final Clinical Impressions(s) / ED Diagnoses   Final diagnoses:  Diarrhea, unspecified type    ED Discharge Orders    None       Lennice Sites, DO 12/28/18 1653

## 2018-12-28 NOTE — ED Notes (Signed)
Spoke to patients husband. Patients husband (Ralp) is waiting outside for patient.

## 2018-12-28 NOTE — ED Notes (Signed)
Attempted to call patients husband. No answer. Left voicemail explaining patient is up for discharge.

## 2018-12-28 NOTE — ED Notes (Signed)
Patient transported to CT 

## 2018-12-28 NOTE — ED Triage Notes (Signed)
Patient arrived by self from home. Pt c/o diarrhea and difficulty urinating that started on Sunday. Pt stated she's never had difficulty urinating.   Pt stated she had D&C done at [facility] Pt has had issues w/ bleeding since then.

## 2018-12-29 LAB — URINE CULTURE: Culture: NO GROWTH

## 2019-01-01 ENCOUNTER — Other Ambulatory Visit: Payer: Self-pay | Admitting: Internal Medicine

## 2019-01-01 DIAGNOSIS — E118 Type 2 diabetes mellitus with unspecified complications: Secondary | ICD-10-CM

## 2019-01-01 DIAGNOSIS — Z794 Long term (current) use of insulin: Secondary | ICD-10-CM

## 2019-01-02 ENCOUNTER — Other Ambulatory Visit: Payer: Self-pay

## 2019-01-02 ENCOUNTER — Ambulatory Visit: Payer: Medicare Other | Attending: Internal Medicine | Admitting: Internal Medicine

## 2019-01-02 DIAGNOSIS — I5032 Chronic diastolic (congestive) heart failure: Secondary | ICD-10-CM | POA: Diagnosis not present

## 2019-01-02 DIAGNOSIS — I2510 Atherosclerotic heart disease of native coronary artery without angina pectoris: Secondary | ICD-10-CM | POA: Diagnosis not present

## 2019-01-02 DIAGNOSIS — E118 Type 2 diabetes mellitus with unspecified complications: Secondary | ICD-10-CM | POA: Diagnosis not present

## 2019-01-02 DIAGNOSIS — I10 Essential (primary) hypertension: Secondary | ICD-10-CM | POA: Diagnosis not present

## 2019-01-02 DIAGNOSIS — Z794 Long term (current) use of insulin: Secondary | ICD-10-CM

## 2019-01-02 DIAGNOSIS — E611 Iron deficiency: Secondary | ICD-10-CM

## 2019-01-02 MED ORDER — ISOSORBIDE MONONITRATE ER 120 MG PO TB24: 120.0000 mg | ORAL_TABLET | Freq: Every day | ORAL | 8 refills | Status: DC

## 2019-01-02 MED ORDER — INSULIN LISPRO PROT & LISPRO (75-25 MIX) 100 UNIT/ML ~~LOC~~ SUSP: SUBCUTANEOUS | 11 refills | Status: DC

## 2019-01-02 NOTE — Progress Notes (Signed)
Virtual Visit via Telephone Note Due to current restrictions/limitations of in-office visits due to the COVID-19 pandemic, this scheduled clinical appointment was converted to a telehealth visit  I connected with [name] on 01/02/19 at 2:12 p.m EDT by telephone and verified that I am speaking with the correct person using two identifiers. I am in my office.  The patient is at home.  Only the patient and myself participated in this encounter.  I discussed the limitations, risks, security and privacy concerns of performing an evaluation and management service by telephone and the availability of in person appointments. I also discussed with the patient that there may be a patient responsible charge related to this service. The patient expressed understanding and agreed to proceed.  History of Present Illness: Pt with hx of DM with probable gastroparesis,retinopathy,HTN, HL, CAD, chronic diastolic CHF, obesity,IDA, OA knees, Vit B 12 def. patient last seen by me in 09/2018   Vaginal Bleeding:  Had D&C, hysteroscope and placement of Liletta.  Bleeding has stopped.  Uterine bx neg for cancer but some focal atypia. -still taking iron -wants to know if okay to restart ASA  DM/Obesity:  Last A1C was 7.5 in 10/2018 up from 6.7.  Did get DM supplies but only checking once a wk.  Gives range 96-157 Doing okay with eating habits.  Admits that she still drinks regular Ginger Ale about 3 x a wk Not getting in much activity.  Afraid to do so due to COVID Reports compliance with Humalog 75/25 inj.  She does not recall the dose because her husband administer it to her.  Use to get 4 viles of insulin but now only gets 3 viles.  Three last a little less than 1 mth  HTN/CAD/diastolic CHF:  Reports compliance with meds.  No recent use of SL Nitro.  No device to check BP No CP/SOB.  Endorses a little swelling in ankles.  No PND or orthopnea -does not have a scale at home Has appt with cardiology later  this mth  Seen in ER recently for diarrhea and problems urinating.  Diarrhea has stopped but now feels a little constipated.  UA and UCx neg  Outpatient Encounter Medications as of 01/02/2019  Medication Sig Note  . ACCU-CHEK SOFTCLIX LANCETS lancets Use as instructed for 3 times daily blood glucose monitoring   . acetaminophen (TYLENOL) 500 MG tablet Take 1 tablet (500 mg total) by mouth every 6 (six) hours as needed. (Patient taking differently: Take 500 mg by mouth every 6 (six) hours as needed (pain.). )   . Acetaminophen-Caff-Pyrilamine (MIDOL COMPLETE PO) Take 1 tablet by mouth every 6 (six) hours as needed (cramps).   Marland Kitchen acetaZOLAMIDE (DIAMOX) 500 MG capsule Take 500 mg by mouth 2 (two) times daily.   Marland Kitchen aspirin EC 81 MG tablet Take 81 mg by mouth daily.   Marland Kitchen atorvastatin (LIPITOR) 80 MG tablet TAKE 1 TABLET BY MOUTH ONCE DAILY IN THE EVENING AT 6PM   . baclofen (LIORESAL) 10 MG tablet Take 1 tablet (10 mg total) by mouth at bedtime as needed for muscle spasms. (Patient taking differently: Take 10 mg by mouth daily as needed for muscle spasms. )   . bismuth subsalicylate (PEPTO BISMOL) 262 MG/15ML suspension Take 30 mLs by mouth every 6 (six) hours as needed for indigestion.   . Blood Glucose Monitoring Suppl (ACCU-CHEK AVIVA PLUS) w/Device KIT Used as directed   . Blood Pressure Monitoring (BLOOD PRESSURE MONITOR/ARM) DEVI 1 each by Does not  apply route daily. ICD 10, I10 and I50.32 (Patient not taking: Reported on 09/19/2018)   . brimonidine-timolol (COMBIGAN) 0.2-0.5 % ophthalmic solution Place 1 drop into both eyes 2 (two) times daily.    . calcium carbonate (TUMS - DOSED IN MG ELEMENTAL CALCIUM) 500 MG chewable tablet Chew 1 tablet by mouth 3 (three) times daily as needed for indigestion.    . Cyanocobalamin (VITAMIN B-12) 1000 MCG TABS Take 1,000 mcg by mouth daily.   Marland Kitchen diltiazem (DILACOR XR) 240 MG 24 hr capsule Take 1 capsule (240 mg total) by mouth daily.   Marland Kitchen docusate sodium (COLACE)  100 MG capsule Take 1 capsule (100 mg total) by mouth 2 (two) times daily. (Patient taking differently: Take 100 mg by mouth 2 (two) times daily as needed (CONSTIPATION). )   . ferrous sulfate 325 (65 FE) MG tablet Take 325 mg by mouth daily with breakfast.   . furosemide (LASIX) 40 MG tablet Take 1 tablet (40 mg total) by mouth 2 (two) times daily. 08/08/2018: total dose of 162m twice daily      . furosemide (LASIX) 40 MG tablet Take 120 mg by mouth 2 (two) times daily. Take 435mtablet with 8026mablet to equal 120m58mD   . furosemide (LASIX) 80 MG tablet TAKE ONE 80MG TABLET BY  MOUTH TWO TIMES DAILY WITH  FUROSEMIDE 40MG (TOTAL DOSE OF 120MG TWO TIMES DAILY )   . glucose blood test strip Use TID before meals / Dx E11.9   . glucose monitoring kit (FREESTYLE) monitoring kit 1 each by Does not apply route 4 (four) times daily - after meals and at bedtime. (Patient not taking: Reported on 09/19/2018)   . insulin lispro protamine-lispro (HUMALOG MIX 75/25) (75-25) 100 UNIT/ML SUSP injection INJECT SUBCUTANEOUSLY 50  UNITS TWICE DAILY WITH  MEALS   . isosorbide mononitrate (IMDUR) 120 MG 24 hr tablet TAKE 1 TABLET BY MOUTH ONCE DAILY   . levonorgestrel (MIRENA) 20 MCG/24HR IUD 1 Intra Uterine Device (1 each total) by Intrauterine route once for 1 dose.   . losartan (COZAAR) 50 MG tablet Take 2 tablets (100 mg total) by mouth daily.   . LUMarland KitchenIGAN 0.01 % SOLN Place 1 drop into the right eye at bedtime.  03/18/2015: .  . megestrol (MEGACE) 40 MG tablet Take 1 tablet (40 mg total) by mouth 2 (two) times daily.   . Menthol-Methyl Salicylate (MUSCLE RUB EX) Apply 1 application topically daily as needed (knee pain).    . meMarland KitchenoCLOPramide (REGLAN) 5 MG tablet TAKE 1 TABLET BY MOUTH TWICE DAILY BEFORE  THE  TWO  LARGE  MEALS  OF  THE  DAY   . metolazone (ZAROXOLYN) 2.5 MG tablet 1 tab once every two weeks PRN swelling lower extremities (Patient not taking: Reported on 09/19/2018)   . metoprolol (TOPROL-XL) 200 MG 24  hr tablet Take 1 tablet by mouth once daily   . nitroGLYCERIN (NITROSTAT) 0.4 MG SL tablet DISSOLVE ONE TABLET UNDER THE TONGUE EVERY 5 MINUTES AS NEEDED FOR CHEST PAIN.  DO NOT EXCEED A TOTAL OF 3 DOSES IN 15 MINUTES NOW (Patient taking differently: Place 0.4 mg under the tongue every 5 (five) minutes as needed for chest pain. )   . pantoprazole (PROTONIX) 40 MG tablet TAKE ONE TABLET BY MOUTH ONCE DAILY (Patient taking differently: Take 40 mg by mouth daily. )   . potassium chloride SA (K-DUR,KLOR-CON) 20 MEQ tablet 2 tabs PO in a.m and 1 tab PO Q p.m   .  RELION INSULIN SYRINGE 31G X 15/64" 1 ML MISC USE AS DIRECTED TWICE DAILY INSULIN ADMINISTRATION    No facility-administered encounter medications on file as of 01/02/2019.     Observations/Objective: Lab Results  Component Value Date   WBC 10.8 (H) 12/28/2018   HGB 11.4 (L) 12/28/2018   HCT 35.9 (L) 12/28/2018   MCV 94.0 12/28/2018   PLT 205 12/28/2018     Chemistry      Component Value Date/Time   NA 141 12/28/2018 1533   NA 146 (H) 09/29/2018 1153   K 3.4 (L) 12/28/2018 1533   CL 112 (H) 12/28/2018 1533   CO2 20 (L) 12/28/2018 1533   BUN 15 12/28/2018 1533   BUN 12 09/29/2018 1153   CREATININE 1.24 (H) 12/28/2018 1533   CREATININE 1.30 (H) 09/28/2016 1004      Component Value Date/Time   CALCIUM 8.6 (L) 12/28/2018 1533   ALKPHOS 60 12/28/2018 1533   AST 14 (L) 12/28/2018 1533   ALT 14 12/28/2018 1533   BILITOT 0.5 12/28/2018 1533   BILITOT 0.4 12/20/2017 0949       Assessment and Plan: 1. Controlled type 2 diabetes mellitus with complication, with long-term current use of insulin ([place]) Dietary counseling given.  Encouraged her to give up all sugary drinks and drink more water. Continue current dose of insulin. Encourage her to check blood sugars more often at least once a day to get a better idea of her level of control. We will send prescription to her pharmacy to request for vials of insulin at a time.  2.  Essential hypertension 3. Coronary artery disease involving native coronary artery of native heart without angina pectoris 4. Chronic diastolic congestive heart failure (HCC)  Stable.  Continue metoprolol, isosorbide, lovastatin, diltiazem, furosemide atorvastatin. Encourage low-salt diet. - isosorbide mononitrate (IMDUR) 120 MG 24 hr tablet; Take 1 tablet (120 mg total) by mouth daily.  Dispense: 30 tablet; Refill: 8  5. Iron deficiency Continue iron for now.  Plan to recheck CBC on next visit to see if she will still need iron given that vaginal bleeding has stopped  Follow Up Instructions: F/u in 3 mths   I discussed the assessment and treatment plan with the patient. The patient was provided an opportunity to ask questions and all were answered. The patient agreed with the plan and demonstrated an understanding of the instructions.   The patient was advised to call back or seek an in-person evaluation if the symptoms worsen or if the condition fails to improve as anticipated.  I provided 20 minutes of non-face-to-face time during this encounter.   Karle Plumber, MD

## 2019-01-03 DIAGNOSIS — R079 Chest pain, unspecified: Secondary | ICD-10-CM | POA: Diagnosis not present

## 2019-01-03 DIAGNOSIS — Z9181 History of falling: Secondary | ICD-10-CM | POA: Diagnosis not present

## 2019-01-03 DIAGNOSIS — M170 Bilateral primary osteoarthritis of knee: Secondary | ICD-10-CM | POA: Diagnosis not present

## 2019-01-03 DIAGNOSIS — R269 Unspecified abnormalities of gait and mobility: Secondary | ICD-10-CM | POA: Diagnosis not present

## 2019-01-05 ENCOUNTER — Telehealth: Payer: Self-pay | Admitting: Cardiovascular Disease

## 2019-01-05 NOTE — Telephone Encounter (Signed)
Home phone/ verbal consent/ my chart declined/ pre reg completed

## 2019-01-11 ENCOUNTER — Telehealth: Payer: Self-pay

## 2019-01-11 ENCOUNTER — Telehealth: Payer: Self-pay | Admitting: Cardiovascular Disease

## 2019-01-11 ENCOUNTER — Telehealth (INDEPENDENT_AMBULATORY_CARE_PROVIDER_SITE_OTHER): Payer: Medicare Other | Admitting: Cardiovascular Disease

## 2019-01-11 VITALS — Ht 60.0 in | Wt 283.0 lb

## 2019-01-11 DIAGNOSIS — I5032 Chronic diastolic (congestive) heart failure: Secondary | ICD-10-CM | POA: Diagnosis not present

## 2019-01-11 DIAGNOSIS — E669 Obesity, unspecified: Secondary | ICD-10-CM

## 2019-01-11 DIAGNOSIS — R339 Retention of urine, unspecified: Secondary | ICD-10-CM

## 2019-01-11 DIAGNOSIS — N183 Chronic kidney disease, stage 3 unspecified: Secondary | ICD-10-CM

## 2019-01-11 DIAGNOSIS — Z794 Long term (current) use of insulin: Secondary | ICD-10-CM

## 2019-01-11 DIAGNOSIS — E118 Type 2 diabetes mellitus with unspecified complications: Secondary | ICD-10-CM

## 2019-01-11 DIAGNOSIS — I130 Hypertensive heart and chronic kidney disease with heart failure and stage 1 through stage 4 chronic kidney disease, or unspecified chronic kidney disease: Secondary | ICD-10-CM | POA: Diagnosis not present

## 2019-01-11 DIAGNOSIS — I10 Essential (primary) hypertension: Secondary | ICD-10-CM

## 2019-01-11 DIAGNOSIS — E785 Hyperlipidemia, unspecified: Secondary | ICD-10-CM

## 2019-01-11 DIAGNOSIS — G4733 Obstructive sleep apnea (adult) (pediatric): Secondary | ICD-10-CM

## 2019-01-11 NOTE — Telephone Encounter (Signed)
Patient returning call for pre reg for appointment at 10:30 with Dr.Croitoru.

## 2019-01-11 NOTE — Progress Notes (Signed)
Virtual Visit via Telephone Note   This visit type was conducted due to national recommendations for restrictions regarding the COVID-19 Pandemic (e.g. social distancing) in an effort to limit this patient's exposure and mitigate transmission in our community.  Due to her co-morbid illnesses, this patient is at least at moderate risk for complications without adequate follow up.  This format is felt to be most appropriate for this patient at this time.  The patient did not have access to video technology/had technical difficulties with video requiring transitioning to audio format only (telephone).  All issues noted in this document were discussed and addressed.  No physical exam could be performed with this format.  Please refer to the patient's chart for her  consent to telehealth for [facility].   Date:  01/11/2019   ID:  Elizabeth, Burns [birth year], MRN [id number]  Patient Location: Home Provider Location: Home  PCP:  Ladell Pier, MD  Cardiologist:  Sanda Klein, MD  Electrophysiologist:  None   Evaluation Performed:  Follow-Up Visit  Chief Complaint:  Problems with urination  History of Present Illness:    [name] is a 63 y.o. female with super obesity, long-standing severe diabetes mellitus with multiple microvascular and macrovascular complications. She has extensive multivessel coronary artery disease with diffuse involvement, making her a poor candidate for either surgical or percutaneous revascularization.  She has chronic diastolic heart failure as well as chronic kidney disease.  She has no cardiovascular complaints and does not have any issues either at rest or with activity.  She has not had edema, palpitations, dizziness, syncope, focal neurological complaints and her weight has been stable.  She has not taken nitroglycerin in many weeks.  Her complaints are inability to urinate.  She feels the urge to urinate and goes and sits on the commode and  nothing comes out.  Later she will have involuntary bladder emptying and then started wearing a diaper.  She was seen in the emergency room Dec 28, 2018 with complaints of left lower quadrant pain, nausea, dysuria and diarrhea.  Her urinalysis was bland.  Her WBC count was borderline elevated, which is a chronic abnormality for her.  A CT of the abdomen was performed without any abnormalities identified.  Specifically there was no evidence of hydronephrosis or nephrolithiasis.  She had multiple fibroids and a right adnexal cyst and an intrauterine device was identified ("IUD in the anterior fundus to the right of the midline").  The patient does not have symptoms concerning for COVID-19 infection (fever, chills, cough, or new shortness of breath).    Past Medical History:  Diagnosis Date  . Blind right eye   . Chronic back pain    "my whole back" (03/18/2015)  . Chronic chest pain    takes isosorbide  . Chronic diastolic heart failure ([place]) 9/13   echo 03/20/15 LV Ef of 60-65%, grade 2DD and PA peak pressure: 36 mm Hg   . CKD (chronic kidney disease), stage III ([place])   . Coronary artery disease cardiologist-- dr Adonys Wildes   per cardiac cath 02-07-2009 -- diffuse 3V CAD involving RCA, CFx, and D1,  pt not a candidate for bypass surgery or angioplasty,  medical management  . Diabetic neuropathy ([place])   . Diabetic retinopathy ([place])    bilateral proliferative  . Diastolic CHF, chronic ([place])   . Dyspnea    "always"  . Edema of both lower extremities   . Generalized weakness   . GERD (gastroesophageal  reflux disease)   . Glaucoma, both eyes   . History of non-ST elevation myocardial infarction (NSTEMI)    02-25-2009;  05-15-2009;  01-08-2010;  04-04-2012 this MI was post-op surgery for abscess on 04-03-2012  . History of sepsis   . Hyperlipemia   . Hypertension   . Insulin dependent type 2 diabetes mellitus ([place])    followed by pcp  . Iron deficiency anemia due to chronic blood loss     uterine bleeding  . Legally blind in left eye, as defined in [place]    per pt states vision "is foggy"  . Mild obstructive sleep apnea    study in epic 04-19-2012 mild osa,  recommendation's were wt. loss, dental  appliance, surgery, or cpap  . Morbid obesity with BMI of 40.0-44.9, adult ([place])   . OA (osteoarthritis)    knees  . PSVT (paroxysmal supraventricular tachycardia) ([place])   . Renal insufficiency    pt. denies  . Seasonal allergies   . Wears glasses    Past Surgical History:  Procedure Laterality Date  . BIOPSY  02/13/2018   Procedure: BIOPSY;  Surgeon: Ladene Artist, MD;  Location: [facility];  Service: Endoscopy;;  . BREAST SURGERY Right 2019    breast biopsy,  per pt benign  . CARDIAC CATHETERIZATION  02-27-2009   dr al little   diffuse 3V CAD , involving RCA, CFx, and D1;  inferior wall abnormalities, low normal ef 50%  . CATARACT EXTRACTION Right   . CATARACT EXTRACTION W/ INTRAOCULAR LENS IMPLANT Left 10-11-2007   '@[facility]'   . COLONOSCOPY WITH PROPOFOL N/A 02/13/2018   Procedure: COLONOSCOPY WITH PROPOFOL;  Surgeon: Ladene Artist, MD;  Location: [facility];  Service: Endoscopy;  Laterality: N/A;  . DILATION AND CURETTAGE OF UTERUS  x2 last one 2000  . EYE SURGERY Bilateral 2009 to 2010   x2  left eye;  x2  right eye   . HYSTEROSCOPY W/D&C N/A 10/11/2018   Procedure: DILATATION AND CURETTAGE /HYSTEROSCOPY;  Surgeon: [name], MD;  Location: [facility];  Service: Gynecology;  Laterality: N/A;  . INCISION AND DRAINAGE PERIRECTAL ABSCESS  04/03/2012  . INTRAUTERINE DEVICE (IUD) INSERTION  10/11/2018   Procedure: INTRAUTERINE DEVICE (IUD) INSERTION;  Surgeon: [name], MD;  Location: [facility];  Service: Gynecology;;  . IR US GUIDE [place] LEFT  02/13/2018  . IR VENIPUNCTURE 77YRS/OLDER BY MD  02/13/2018  . POLYPECTOMY  02/13/2018   Procedure: POLYPECTOMY;  Surgeon: Ladene Artist, MD;  Location: [facility];  Service: Endoscopy;;     Current Meds  Medication Sig   . ACCU-CHEK SOFTCLIX LANCETS lancets Use as instructed for 3 times daily blood glucose monitoring  . acetaminophen (TYLENOL) 500 MG tablet Take 1 tablet (500 mg total) by mouth every 6 (six) hours as needed. (Patient taking differently: Take 500 mg by mouth every 6 (six) hours as needed (pain.). )  . Acetaminophen-Caff-Pyrilamine (MIDOL COMPLETE PO) Take 1 tablet by mouth every 6 (six) hours as needed (cramps).  Marland Kitchen acetaZOLAMIDE (DIAMOX) 500 MG capsule Take 500 mg by mouth 2 (two) times daily.  Marland Kitchen aspirin EC 81 MG tablet Take 81 mg by mouth daily.  Marland Kitchen atorvastatin (LIPITOR) 80 MG tablet TAKE 1 TABLET BY MOUTH ONCE DAILY IN THE EVENING AT 6PM  . bismuth subsalicylate (PEPTO BISMOL) 262 MG/15ML suspension Take 30 mLs by mouth every 6 (six) hours as needed for indigestion.  . Blood Glucose Monitoring Suppl (ACCU-CHEK AVIVA PLUS) w/Device KIT Used  as directed  . Blood Pressure Monitoring (BLOOD PRESSURE MONITOR/ARM) DEVI 1 each by Does not apply route daily. ICD 10, I10 and I50.32  . brimonidine-timolol (COMBIGAN) 0.2-0.5 % ophthalmic solution Place 1 drop into both eyes 2 (two) times daily.   . calcium carbonate (TUMS - DOSED IN MG ELEMENTAL CALCIUM) 500 MG chewable tablet Chew 1 tablet by mouth 3 (three) times daily as needed for indigestion.   . Cyanocobalamin (VITAMIN B-12) 1000 MCG TABS Take 1,000 mcg by mouth daily.  Marland Kitchen diltiazem (DILACOR XR) 240 MG 24 hr capsule Take 1 capsule (240 mg total) by mouth daily.  Marland Kitchen docusate sodium (COLACE) 100 MG capsule Take 1 capsule (100 mg total) by mouth 2 (two) times daily. (Patient taking differently: Take 100 mg by mouth 2 (two) times daily as needed (CONSTIPATION). )  . ferrous sulfate 325 (65 FE) MG tablet Take 325 mg by mouth daily with breakfast.  . furosemide (LASIX) 40 MG tablet Take 1 tablet (40 mg total) by mouth 2 (two) times daily.  . furosemide (LASIX) 40 MG tablet Take 120 mg by mouth 2 (two) times daily. Take 47m tablet with 89mtablet to equal  12060mID  . furosemide (LASIX) 80 MG tablet TAKE ONE 80MG TABLET BY  MOUTH TWO TIMES DAILY WITH  FUROSEMIDE 40MG (TOTAL DOSE OF 120MG TWO TIMES DAILY )  . glucose blood test strip Use TID before meals / Dx E11.9  . glucose monitoring kit (FREESTYLE) monitoring kit 1 each by Does not apply route 4 (four) times daily - after meals and at bedtime.  . insulin lispro protamine-lispro (HUMALOG MIX 75/25) (75-25) 100 UNIT/ML SUSP injection INJECT SUBCUTANEOUSLY 50  UNITS TWICE DAILY WITH  MEALS  . isosorbide mononitrate (IMDUR) 120 MG 24 hr tablet Take 1 tablet (120 mg total) by mouth daily.  . lMarland Kitchensartan (COZAAR) 50 MG tablet Take 2 tablets (100 mg total) by mouth daily.  . LMarland KitchenMIGAN 0.01 % SOLN Place 1 drop into the right eye at bedtime.   . megestrol (MEGACE) 40 MG tablet Take 1 tablet (40 mg total) by mouth 2 (two) times daily.  . Menthol-Methyl Salicylate (MUSCLE RUB EX) Apply 1 application topically daily as needed (knee pain).   . mMarland KitchentoCLOPramide (REGLAN) 5 MG tablet TAKE 1 TABLET BY MOUTH TWICE DAILY BEFORE  THE  TWO  LARGE  MEALS  OF  THE  DAY  . metolazone (ZAROXOLYN) 2.5 MG tablet 1 tab once every two weeks PRN swelling lower extremities  . metoprolol (TOPROL-XL) 200 MG 24 hr tablet Take 1 tablet by mouth once daily  . nitroGLYCERIN (NITROSTAT) 0.4 MG SL tablet DISSOLVE ONE TABLET UNDER THE TONGUE EVERY 5 MINUTES AS NEEDED FOR CHEST PAIN.  DO NOT EXCEED A TOTAL OF 3 DOSES IN 15 MINUTES NOW (Patient taking differently: Place 0.4 mg under the tongue every 5 (five) minutes as needed for chest pain. )  . pantoprazole (PROTONIX) 40 MG tablet TAKE ONE TABLET BY MOUTH ONCE DAILY (Patient taking differently: Take 40 mg by mouth daily. )  . potassium chloride SA (K-DUR,KLOR-CON) 20 MEQ tablet 2 tabs PO in a.m and 1 tab PO Q p.m  . RELION INSULIN SYRINGE 31G X 15/64" 1 ML MISC USE AS DIRECTED TWICE DAILY INSULIN ADMINISTRATION     Allergies:   Ativan [lorazepam] and Orange fruit [citrus]   Social History    Tobacco Use  . Smoking status: Never Smoker  . Smokeless tobacco: Never Used  Substance Use Topics  . Alcohol  use: No  . Drug use: Never     Family Hx: The patient's family history includes Breast cancer in her cousin and mother; Colon cancer in her cousin; Colon polyps in her father; Diabetes in her brother, father, maternal grandmother, mother, and sister; Heart attack in her father and mother; Heart disease in her brother, father, mother, and sister; Hypertension in her brother, father, maternal grandmother, mother, and sister; Stroke in her father. There is no history of Esophageal cancer, Rectal cancer, or Stomach cancer.  ROS:   Please see the history of present illness.     All other systems reviewed and are negative.   Prior CV studies:   The following studies were reviewed today:  Echocardiogram 2017 showed normal left ventricular systolic function with EF 55-60%.  Although there was evidence of diastolic dysfunction filling  pressures were normal (E/e'=5).  Labs/Other Tests and Data Reviewed:    EKG:  An ECG dated September 04, 2018 was personally reviewed today and demonstrated:  Sinus rhythm, right bundle branch block with T wave inversion in leads V1-V6  Recent Labs: 12/28/2018: ALT 14; BUN 15; Creatinine, Ser 1.24; Hemoglobin 11.4; Platelets 205; Potassium 3.4; Sodium 141   Recent Lipid Panel Lab Results  Component Value Date/Time   CHOL 107 12/20/2017 09:49 AM   TRIG 86 12/20/2017 09:49 AM   HDL 35 (L) 12/20/2017 09:49 AM   CHOLHDL 3.1 12/20/2017 09:49 AM   CHOLHDL 2.9 09/12/2015 11:47 AM   LDLCALC 55 12/20/2017 09:49 AM    Wt Readings from Last 3 Encounters:  01/11/19 283 lb (128.4 kg)  12/28/18 283 lb (128.4 kg)  10/11/18 282 lb 3 oz (128 kg)     Objective:    Vital Signs:  Ht 5' (1.524 m)   Wt 283 lb (128.4 kg)   LMP 04/02/2012   BMI 55.27 kg/m    VITAL SIGNS:  reviewed Unable to examine  ASSESSMENT & PLAN:    1. CHF: Unable to examine the  patient, but she does not have any clinical complaints to suggest heart failure exacerbation and her weight is close to our previous estimated "dry weight" (280 pounds. 2. CAD: She has extensive coronary disease that was not amenable to either percutaneous or surgical revascularization due to its diffuse nature.  On the current medical regimen she does not have angina pectoris with usual activity. 3. HLP: All lipid parameters appear to be at target.  LDL well under 70. 4. DM: Glycemic control is not quite adequate with a hemoglobin A1c of 7.5%. 5. CKD 3: Stable renal parameters.  Nephrologist is Dr. Pearson Grippe.  Baseline creatinine seems to be 1.2-1.3 and was 1.24 at her recent emergency room visit.  She was mildly hypokalemic. 6. Super obesity: Continued attempts at weight loss were encouraged. 7. Urinary difficulty: Her urinalysis was bland and a CT of the abdomen and pelvis did not identify the problem.  We will forward this note to her gynecologist and I recommended referral to an urologist.   COVID-19 Education: The signs and symptoms of COVID-19 were discussed with the patient and how to seek care for testing (follow up with PCP or arrange E-visit).  The importance of social distancing was discussed today.  Time:   Today, I have spent 22 minutes with the patient with telehealth technology discussing the above problems.     Medication Adjustments/Labs and Tests Ordered: Current medicines are reviewed at length with the patient today.  Concerns regarding medicines are outlined above.  Tests Ordered: No orders of the defined types were placed in this encounter.   Medication Changes: No orders of the defined types were placed in this encounter.  Patient Instructions  Medication Instructions:  Your physician recommends that you continue on your current medications as directed. Please refer to the Current Medication list given to you today.  If you need a refill on your cardiac  medications before your next appointment, please call your pharmacy.   Lab work: Your provider would like for you to return within the month of June to have the following labs drawn: fasting Lipid. You do not need an appointment for the lab. Once in our office lobby there is a podium where you can sign in and ring the doorbell to alert [name] that you are here. The lab is open from 8:00 am to 4:30 pm; closed for lunch from 12:45pm-1:45pm.  If you have labs (blood work) drawn today and your tests are completely normal, you will receive your results only by: [place] (if you have MyChart) OR A paper copy in the mail If you have any lab test that is abnormal or we need to change your treatment, we will call you to review the results.  Testing/Procedures: None ordered  Follow-Up: At [facility], you and your health needs are our priority.  As part of our continuing mission to provide you with exceptional heart care, we have created designated Provider Care Teams.  These Care Teams include your primary Cardiologist (physician) and Advanced Practice Providers (APPs -  Physician Assistants and Nurse Practitioners) who all work together to provide you with the care you need, when you need it. You will need a follow up appointment in 12 months.  Please call our office 2 months in advance to schedule this appointment.  You may see Sanda Klein, MD or one of the following Advanced Practice Providers on your designated Care Team: Almyra Deforest, [place] Fabian Sharp, [place]  Any Other Special Instructions Will Be Listed Below (If Applicable). A referral has been placed to [facility]. They will call you to set up an appointment.        Disposition:  Follow up 12 months  Signed, Sanda Klein, MD  01/11/2019 10:52 AM    [place]

## 2019-01-11 NOTE — Patient Instructions (Signed)
Medication Instructions:  Your physician recommends that you continue on your current medications as directed. Please refer to the Current Medication list given to you today.  If you need a refill on your cardiac medications before your next appointment, please call your pharmacy.   Lab work: Your provider would like for you to return within the month of June to have the following labs drawn: fasting Lipid. You do not need an appointment for the lab. Once in our office lobby there is a podium where you can sign in and ring the doorbell to alert [name] that you are here. The lab is open from 8:00 am to 4:30 pm; closed for lunch from 12:45pm-1:45pm.  If you have labs (blood work) drawn today and your tests are completely normal, you will receive your results only by: [place] (if you have MyChart) OR A paper copy in the mail If you have any lab test that is abnormal or we need to change your treatment, we will call you to review the results.  Testing/Procedures: None ordered  Follow-Up: At [facility], you and your health needs are our priority.  As part of our continuing mission to provide you with exceptional heart care, we have created designated Provider Care Teams.  These Care Teams include your primary Cardiologist (physician) and Advanced Practice Providers (APPs -  Physician Assistants and Nurse Practitioners) who all work together to provide you with the care you need, when you need it. You will need a follow up appointment in 12 months.  Please call our office 2 months in advance to schedule this appointment.  You may see Sanda Klein, MD or one of the following Advanced Practice Providers on your designated Care Team: Almyra Deforest, [place] Fabian Sharp, [place]  Any Other Special Instructions Will Be Listed Below (If Applicable). A referral has been placed to [facility]. They will call you to set up an appointment.

## 2019-01-11 NOTE — Telephone Encounter (Signed)
Left message for patient to call office to prechart before virtual visit with Dr. Sallyanne Kuster at 10:30.

## 2019-01-11 NOTE — Telephone Encounter (Signed)
New Message ° ° ° °Pt is returning call  ° ° ° °Please call back  °

## 2019-01-15 ENCOUNTER — Emergency Department (HOSPITAL_COMMUNITY): Payer: Medicare Other

## 2019-01-15 ENCOUNTER — Emergency Department (HOSPITAL_COMMUNITY)
Admission: EM | Admit: 2019-01-15 | Discharge: 2019-01-16 | Disposition: A | Discharge status: Home / Self Care | Payer: Medicare Other | Attending: Emergency Medicine | Admitting: Emergency Medicine

## 2019-01-15 DIAGNOSIS — I4510 Unspecified right bundle-branch block: Secondary | ICD-10-CM | POA: Diagnosis not present

## 2019-01-15 DIAGNOSIS — N183 Chronic kidney disease, stage 3 (moderate): Secondary | ICD-10-CM | POA: Diagnosis not present

## 2019-01-15 DIAGNOSIS — I5032 Chronic diastolic (congestive) heart failure: Secondary | ICD-10-CM | POA: Insufficient documentation

## 2019-01-15 DIAGNOSIS — R300 Dysuria: Secondary | ICD-10-CM | POA: Diagnosis not present

## 2019-01-15 DIAGNOSIS — Z794 Long term (current) use of insulin: Secondary | ICD-10-CM | POA: Diagnosis not present

## 2019-01-15 DIAGNOSIS — E1122 Type 2 diabetes mellitus with diabetic chronic kidney disease: Secondary | ICD-10-CM | POA: Diagnosis not present

## 2019-01-15 DIAGNOSIS — R1084 Generalized abdominal pain: Secondary | ICD-10-CM

## 2019-01-15 DIAGNOSIS — I130 Hypertensive heart and chronic kidney disease with heart failure and stage 1 through stage 4 chronic kidney disease, or unspecified chronic kidney disease: Secondary | ICD-10-CM | POA: Insufficient documentation

## 2019-01-15 DIAGNOSIS — R3911 Hesitancy of micturition: Secondary | ICD-10-CM | POA: Diagnosis not present

## 2019-01-15 DIAGNOSIS — I2510 Atherosclerotic heart disease of native coronary artery without angina pectoris: Secondary | ICD-10-CM | POA: Diagnosis not present

## 2019-01-15 DIAGNOSIS — N939 Abnormal uterine and vaginal bleeding, unspecified: Secondary | ICD-10-CM | POA: Diagnosis not present

## 2019-01-15 DIAGNOSIS — Z79899 Other long term (current) drug therapy: Secondary | ICD-10-CM | POA: Diagnosis not present

## 2019-01-15 DIAGNOSIS — Z7982 Long term (current) use of aspirin: Secondary | ICD-10-CM | POA: Insufficient documentation

## 2019-01-15 DIAGNOSIS — R3915 Urgency of urination: Secondary | ICD-10-CM | POA: Diagnosis not present

## 2019-01-15 DIAGNOSIS — R109 Unspecified abdominal pain: Secondary | ICD-10-CM | POA: Diagnosis not present

## 2019-01-15 LAB — COMPREHENSIVE METABOLIC PANEL
ALT: 14 U/L (ref 0–44)
AST: 14 U/L — ABNORMAL LOW (ref 15–41)
Albumin: 2.8 g/dL — ABNORMAL LOW (ref 3.5–5.0)
Alkaline Phosphatase: 68 U/L (ref 38–126)
Anion gap: 10 (ref 5–15)
BUN: 12 mg/dL (ref 8–23)
CO2: 19 mmol/L — ABNORMAL LOW (ref 22–32)
Calcium: 8.5 mg/dL — ABNORMAL LOW (ref 8.9–10.3)
Chloride: 108 mmol/L (ref 98–111)
Creatinine, Ser: 1.44 mg/dL — ABNORMAL HIGH (ref 0.44–1.00)
GFR calc Af Amer: 44 mL/min — ABNORMAL LOW (ref >60)
GFR calc non Af Amer: 38 mL/min — ABNORMAL LOW (ref >60)
Glucose, Bld: 278 mg/dL — ABNORMAL HIGH (ref 70–99)
Potassium: 4.1 mmol/L (ref 3.5–5.1)
Sodium: 137 mmol/L (ref 135–145)
Total Bilirubin: 0.6 mg/dL (ref 0.3–1.2)
Total Protein: 7.4 g/dL (ref 6.5–8.1)

## 2019-01-15 LAB — DIFFERENTIAL
Basophils Absolute: 0.1 10*3/uL (ref 0.0–0.1)
Basophils Relative: 0 %
Eosinophils Absolute: 0.1 10*3/uL (ref 0.0–0.5)
Eosinophils Relative: 0 %
Lymphocytes Relative: 12 %
Lymphs Abs: 2.3 10*3/uL (ref 0.7–4.0)
Monocytes Absolute: 1.2 10*3/uL — ABNORMAL HIGH (ref 0.1–1.0)
Monocytes Relative: 6 %
Neutro Abs: 15.8 10*3/uL — ABNORMAL HIGH (ref 1.7–7.7)
Neutrophils Relative %: 81 %

## 2019-01-15 LAB — URINALYSIS, ROUTINE W REFLEX MICROSCOPIC

## 2019-01-15 LAB — LIPASE, BLOOD: Lipase: 23 U/L (ref 11–51)

## 2019-01-15 LAB — URINALYSIS, MICROSCOPIC (REFLEX)
RBC / HPF: >50 RBC/hpf (ref 0–5)
WBC, UA: >50 WBC/hpf (ref 0–5)

## 2019-01-15 LAB — CBC
HCT: 38.5 % (ref 36.0–46.0)
Hemoglobin: 12.3 g/dL (ref 12.0–15.0)
MCH: 29.6 pg (ref 26.0–34.0)
MCHC: 31.9 g/dL (ref 30.0–36.0)
MCV: 92.8 fL (ref 80.0–100.0)
Platelets: 276 10*3/uL (ref 150–400)
RBC: 4.15 MIL/uL (ref 3.87–5.11)
RDW: 14.4 % (ref 11.5–15.5)
WBC: 19.6 10*3/uL — ABNORMAL HIGH (ref 4.0–10.5)
nRBC: 0 % (ref 0.0–0.2)

## 2019-01-15 LAB — WET PREP, GENITAL
Clue Cells Wet Prep HPF POC: NONE SEEN
Sperm: NONE SEEN
Trich, Wet Prep: NONE SEEN
Yeast Wet Prep HPF POC: NONE SEEN

## 2019-01-15 LAB — LACTIC ACID, PLASMA: Lactic Acid, Venous: 1.3 mmol/L (ref 0.5–1.9)

## 2019-01-15 MED ORDER — SODIUM CHLORIDE 0.9 % IV BOLUS
500.0000 mL | Freq: Once | INTRAVENOUS | Status: AC
  Administered 2019-01-15: 500 mL via INTRAVENOUS

## 2019-01-15 MED ORDER — HYDROCODONE-ACETAMINOPHEN 5-325 MG PO TABS: 2.0000 | ORAL_TABLET | ORAL | 0 refills | Status: DC | PRN

## 2019-01-15 MED ORDER — MORPHINE SULFATE (PF) 4 MG/ML IV SOLN
4.0000 mg | Freq: Once | INTRAVENOUS | Status: AC
  Administered 2019-01-15: 4 mg via INTRAVENOUS
  Filled 2019-01-15: qty 1

## 2019-01-15 MED ORDER — CEPHALEXIN 500 MG PO CAPS: 500.0000 mg | ORAL_CAPSULE | Freq: Three times a day (TID) | ORAL | 0 refills | Status: DC

## 2019-01-15 MED ORDER — SODIUM CHLORIDE 0.9% FLUSH: 10.0000 mL | INTRAVENOUS | Status: DC | PRN

## 2019-01-15 MED ORDER — IOHEXOL 300 MG/ML  SOLN
100.0000 mL | Freq: Once | INTRAMUSCULAR | Status: AC | PRN
  Administered 2019-01-15: 100 mL via INTRAVENOUS

## 2019-01-15 NOTE — ED Triage Notes (Signed)
Pt here from home with c/o vaginal bleed times 3 weeks , along with abd pain , has history of same

## 2019-01-15 NOTE — ED Provider Notes (Signed)
[facility] EMERGENCY DEPARTMENT Provider Note   CSN: [id number] Arrival date & time: 01/15/19  1714    History   Chief Complaint Chief Complaint  Patient presents with  . Vaginal Bleeding    HPI [name] is a 63 y.o. female with a past medical history of chronic back pain, chronic chest pain, chronic diastolic heart failure, CKD 3, CAD, DM, hypertension, hyperlipidemia, morbid obesity, postmenopausal bleeding status post IUD insertion who presents today for evaluation of vaginal bleeding.  She reports that she has been having vaginal bleeding and lower abdominal/back pain/cramping for 3 weeks.  It is been gradually worsening.  She has been taking Pamprin without significant relief.  She has not made her OB/GYN, who is previously evaluated her for her vaginal bleeding, aware of this.  She has not reached out to her primary care doctor about this issue.  Chart review shows that she had a D&C with biopsy and IUD placed in the beginning of this year for postmenopausal bleeding.  She reports that she had not had any bleeding until 3 weeks ago when it started.  She denies any fevers, nausea vomiting.  She does report generalized abdominal pain and pelvic pain.  She reports dysuria, increased urgency, difficulty urinating.  She reports that has been worsening over the past three weeks.  She denies any trauma, no recent sexual activity.      HPI  Past Medical History:  Diagnosis Date  . Blind right eye   . Chronic back pain    "my whole back" (03/18/2015)  . Chronic chest pain    takes isosorbide  . Chronic diastolic heart failure ([place]) 9/13   echo 03/20/15 LV Ef of 60-65%, grade 2DD and PA peak pressure: 36 mm Hg   . CKD (chronic kidney disease), stage III ([place])   . Coronary artery disease cardiologist-- dr croitoru   per cardiac cath 02-07-2009 -- diffuse 3V CAD involving RCA, CFx, and D1,  pt not a candidate for bypass surgery or angioplasty,  medical  management  . Diabetic neuropathy ([place])   . Diabetic retinopathy ([place])    bilateral proliferative  . Diastolic CHF, chronic ([place])   . Dyspnea    "always"  . Edema of both lower extremities   . Generalized weakness   . GERD (gastroesophageal reflux disease)   . Glaucoma, both eyes   . History of non-ST elevation myocardial infarction (NSTEMI)    02-25-2009;  05-15-2009;  01-08-2010;  04-04-2012 this MI was post-op surgery for abscess on 04-03-2012  . History of sepsis   . Hyperlipemia   . Hypertension   . Insulin dependent type 2 diabetes mellitus ([place])    followed by pcp  . Iron deficiency anemia due to chronic blood loss    uterine bleeding  . Legally blind in left eye, as defined in [place]    per pt states vision "is foggy"  . Mild obstructive sleep apnea    study in epic 04-19-2012 mild osa,  recommendation's were wt. loss, dental  appliance, surgery, or cpap  . Morbid obesity with BMI of 40.0-44.9, adult ([place])   . OA (osteoarthritis)    knees  . PSVT (paroxysmal supraventricular tachycardia) ([place])   . Renal insufficiency    pt. denies  . Seasonal allergies   . Wears glasses     Patient Active Problem List   Diagnosis Date Noted  . Legally blind in right eye, as defined in [place] 08/11/2018  . Bilateral carpal  tunnel syndrome 04/13/2018  . Adenomatous polyp of colon 04/13/2018  . Fibrocystic breast changes, right 12/12/2017  . Drug-induced constipation 09/15/2017  . Primary osteoarthritis of both knees 04/05/2017  . Chronic bilateral low back pain 03/07/2017  . Chronic pain of right knee 03/07/2017  . Vitamin B12 deficiency 12/09/2016  . Incidental lung nodule, > 69m and < 871m02/13/2018  . Vitreous hemorrhage of right eye ([facility]  . Diabetic gastroparesis associated with type 2 diabetes mellitus ([facility]02/05/2016  . Controlled type 2 diabetes mellitus with complication, with long-term current use of insulin ([facility]02/05/2016  . Chronic renal insufficiency, stage III  (moderate) ([facility]08/17/2016  . Prolonged Q-T interval on ECG 08/10/2013  . Dyslipidemia 04/02/2013  . Chronic diastolic heart failure, NYHA class 3 ([facility]09/01/2012  . Presumed OSA (obstructive sleep apnea) 04/06/2012  . NSTEMI  post-op 04/04/12-medical Rx 04/04/2012  . Severe obesity (BMI >= 40) ([facility]06/17/2013  . GERD 08/21/2009  . Post-menopausal bleeding 03/20/2009  . PERIPHERAL EDEMA 03/20/2009  . Coronary atherosclerosis-not a surgical or PCI candidate 2010 02/27/2009  . Proliferative diabetic retinopathy ([facility]/01/2008  . DETACHED RETINA, BILATERAL, HX OF 09/07/2007  . History of chronic Iron defeicency anemia with acute post op anemia, transfused after debridment 04/04/12 04/17/2007  . Essential hypertension 04/17/2007    Past Surgical History:  Procedure Laterality Date  . BIOPSY  02/13/2018   Procedure: BIOPSY;  Surgeon: StLadene ArtistMD;  Location: [facility];  Service: Endoscopy;;  . BREAST SURGERY Right 2019    breast biopsy,  per pt benign  . CARDIAC CATHETERIZATION  02-27-2009   dr al little   diffuse 3V CAD , involving RCA, CFx, and D1;  inferior wall abnormalities, low normal ef 50%  . CATARACT EXTRACTION Right   . CATARACT EXTRACTION W/ INTRAOCULAR LENS IMPLANT Left 10-11-2007   '@[facility]'   . COLONOSCOPY WITH PROPOFOL N/A 02/13/2018   Procedure: COLONOSCOPY WITH PROPOFOL;  Surgeon: StLadene ArtistMD;  Location: [facility];  Service: Endoscopy;  Laterality: N/A;  . DILATION AND CURETTAGE OF UTERUS  x2 last one 2000  . EYE SURGERY Bilateral 2009 to 2010   x2  left eye;  x2  right eye   . HYSTEROSCOPY W/D&C N/A 10/11/2018   Procedure: DILATATION AND CURETTAGE /HYSTEROSCOPY;  Surgeon: PrDonnamae JudeMD;  Location: [facility];  Service: Gynecology;  Laterality: N/A;  . INCISION AND DRAINAGE PERIRECTAL ABSCESS  04/03/2012  . INTRAUTERINE DEVICE (IUD) INSERTION  10/11/2018   Procedure: INTRAUTERINE DEVICE (IUD) INSERTION;  Surgeon: PrDonnamae JudeMD;  Location: [facility];   Service: Gynecology;;  . [place]  02/13/2018  . IR VENIPUNCTURE 53YRS/OLDER BY MD  02/13/2018  . POLYPECTOMY  02/13/2018   Procedure: POLYPECTOMY;  Surgeon: StLadene ArtistMD;  Location: [facility];  Service: Endoscopy;;     OB History    Gravida  0   Para  0   Term  0   Preterm  0   AB  0   Living  0     SAB  0   TAB  0   Ectopic  0   Multiple  0   Live Births  0            Home Medications    Prior to Admission medications   Medication Sig Start Date End Date Taking? Authorizing Provider  ACCU-CHEK SOFTCLIX LANCETS lancets Use as instructed for 3 times daily blood glucose monitoring 09/29/18  Ladell Pier, MD  acetaminophen (TYLENOL) 500 MG tablet Take 1 tablet (500 mg total) by mouth every 6 (six) hours as needed. Patient taking differently: Take 500 mg by mouth every 6 (six) hours as needed (pain.).  09/09/16   Clent Demark, PA-C  Acetaminophen-Caff-Pyrilamine (MIDOL COMPLETE PO) Take 1 tablet by mouth every 6 (six) hours as needed (cramps).    [provider]  acetaZOLAMIDE (DIAMOX) 500 MG capsule Take 500 mg by mouth 2 (two) times daily.    [provider]  aspirin EC 81 MG tablet Take 81 mg by mouth daily.    [provider]  atorvastatin (LIPITOR) 80 MG tablet TAKE 1 TABLET BY MOUTH ONCE DAILY IN THE EVENING AT 6PM 12/19/18   Ladell Pier, MD  baclofen (LIORESAL) 10 MG tablet Take 1 tablet (10 mg total) by mouth at bedtime as needed for muscle spasms. Patient not taking: Reported on 01/11/2019 09/28/16   Boykin Nearing, MD  bismuth subsalicylate (PEPTO BISMOL) 262 MG/15ML suspension Take 30 mLs by mouth every 6 (six) hours as needed for indigestion.    [provider]  Blood Glucose Monitoring Suppl (ACCU-CHEK AVIVA PLUS) w/Device KIT Used as directed 09/29/18   Ladell Pier, MD  Blood Pressure Monitoring (BLOOD PRESSURE MONITOR/ARM) DEVI 1 each by Does not apply route daily. ICD  10, I10 and I50.32 03/07/17   Funches, Josalyn, MD  brimonidine-timolol (COMBIGAN) 0.2-0.5 % ophthalmic solution Place 1 drop into both eyes 2 (two) times daily.     [provider]  calcium carbonate (TUMS - DOSED IN MG ELEMENTAL CALCIUM) 500 MG chewable tablet Chew 1 tablet by mouth 3 (three) times daily as needed for indigestion.     [provider]  Cyanocobalamin (VITAMIN B-12) 1000 MCG TABS Take 1,000 mcg by mouth daily. 09/15/17   Ladell Pier, MD  diltiazem (DILACOR XR) 240 MG 24 hr capsule Take 1 capsule (240 mg total) by mouth daily. 09/15/17   Ladell Pier, MD  docusate sodium (COLACE) 100 MG capsule Take 1 capsule (100 mg total) by mouth 2 (two) times daily. Patient taking differently: Take 100 mg by mouth 2 (two) times daily as needed (CONSTIPATION).  09/15/17   Ladell Pier, MD  ferrous sulfate 325 (65 FE) MG tablet Take 325 mg by mouth daily with breakfast.    [provider]  furosemide (LASIX) 40 MG tablet Take 1 tablet (40 mg total) by mouth 2 (two) times daily. 06/10/18   Ladell Pier, MD  furosemide (LASIX) 40 MG tablet Take 120 mg by mouth 2 (two) times daily. Take 24m tablet with 870mtablet to equal 12072mID    [provider]  furosemide (LASIX) 80 MG tablet TAKE ONE 80MG TABLET BY  MOUTH TWO TIMES DAILY WITH  FUROSEMIDE 40MG (TOTAL DOSE OF 120MG TWO TIMES DAILY ) 10/23/18   JohLadell PierD  glucose blood test strip Use TID before meals / Dx E11.9 09/29/18   JohLadell PierD  glucose monitoring kit (FREESTYLE) monitoring kit 1 each by Does not apply route 4 (four) times daily - after meals and at bedtime. 09/12/15   Funches, JosAdriana MccallumD  insulin lispro protamine-lispro (HUMALOG MIX 75/25) (75-25) 100 UNIT/ML SUSP injection INJECT SUBCUTANEOUSLY 50  UNITS TWICE DAILY WITH  MEALS 01/02/19   JohLadell PierD  isosorbide mononitrate (IMDUR) 120 MG 24 hr tablet Take 1 tablet (120 mg total) by mouth daily. 01/02/19  Ladell Pier, MD  levonorgestrel (MIRENA) 20 MCG/24HR IUD 1 Intra Uterine Device (1 each total) by Intrauterine route once for 1 dose. 08/08/18 08/08/18  [name], MD  losartan (COZAAR) 50 MG tablet Take 2 tablets (100 mg total) by mouth daily. 09/29/18   Ladell Pier, MD  LUMIGAN 0.01 % SOLN Place 1 drop into the right eye at bedtime.  03/06/14   [provider]  megestrol (MEGACE) 40 MG tablet Take 1 tablet (40 mg total) by mouth 2 (two) times daily. 10/19/18   [name], MD  Menthol-Methyl Salicylate (MUSCLE RUB EX) Apply 1 application topically daily as needed (knee pain).     [provider]  metoCLOPramide (REGLAN) 5 MG tablet TAKE 1 TABLET BY MOUTH TWICE DAILY BEFORE  THE  TWO  LARGE  MEALS  OF  THE  DAY 12/19/18   Ladell Pier, MD  metolazone (ZAROXOLYN) 2.5 MG tablet 1 tab once every two weeks PRN swelling lower extremities 09/15/17   Ladell Pier, MD  metoprolol (TOPROL-XL) 200 MG 24 hr tablet Take 1 tablet by mouth once daily 11/13/18   Croitoru, Dani Gobble, MD  nitroGLYCERIN (NITROSTAT) 0.4 MG SL tablet DISSOLVE ONE TABLET UNDER THE TONGUE EVERY 5 MINUTES AS NEEDED FOR CHEST PAIN.  DO NOT EXCEED A TOTAL OF 3 DOSES IN 15 MINUTES NOW Patient taking differently: Place 0.4 mg under the tongue every 5 (five) minutes as needed for chest pain.  10/26/17   Croitoru, Mihai, MD  pantoprazole (PROTONIX) 40 MG tablet TAKE ONE TABLET BY MOUTH ONCE DAILY Patient taking differently: Take 40 mg by mouth daily.  01/13/16   Funches, Adriana Mccallum, MD  potassium chloride SA (K-DUR,KLOR-CON) 20 MEQ tablet 2 tabs PO in a.m and 1 tab PO Q p.m 10/30/18   Croitoru, Mihai, MD  RELION INSULIN SYRINGE 31G X 15/64" 1 ML MISC USE AS DIRECTED TWICE DAILY INSULIN ADMINISTRATION 08/17/18   Ladell Pier, MD    Family History Family History  Problem Relation Age of Onset  . Diabetes Mother   . Heart disease Mother   . Heart attack Mother   . Hypertension Mother   . Breast cancer  Mother   . Colon polyps Father   . Diabetes Father   . Heart disease Father   . Heart attack Father   . Stroke Father   . Hypertension Father   . Diabetes Brother   . Diabetes Sister   . Heart disease Brother   . Heart disease Sister   . Diabetes Maternal Grandmother   . Hypertension Maternal Grandmother   . Hypertension Brother   . Hypertension Sister   . Breast cancer Cousin   . Colon cancer Cousin   . Esophageal cancer Neg Hx   . Rectal cancer Neg Hx   . Stomach cancer Neg Hx     Social History Social History   Tobacco Use  . Smoking status: Never Smoker  . Smokeless tobacco: Never Used  Substance Use Topics  . Alcohol use: No  . Drug use: Never     Allergies   Ativan [lorazepam] and Orange fruit [citrus]   Review of Systems Review of Systems  Constitutional: Negative for chills and fever.  HENT: Negative for congestion.   Respiratory: Negative for shortness of breath.   Cardiovascular: Negative for chest pain.  Gastrointestinal: Positive for abdominal pain. Negative for diarrhea, nausea and vomiting.  Genitourinary: Positive for difficulty urinating, dysuria, frequency, menstrual problem, pelvic pain, urgency, vaginal  bleeding and vaginal pain.  Musculoskeletal: Positive for back pain.     Physical Exam Updated Vital Signs BP (!) 153/63 (BP Location: Right Arm)   Pulse 73   Temp 98.2 F (36.8 C) (Rectal)   Resp 11   LMP 04/02/2012   SpO2 98%   Physical Exam Vitals signs and nursing note reviewed. Exam conducted with a chaperone present (Patient's primary RN).  Constitutional:      Appearance: She is well-developed. She is obese. She is not diaphoretic.  HENT:     Head: Normocephalic and atraumatic.  Eyes:     General: No scleral icterus.       Right eye: No discharge.        Left eye: No discharge.     Conjunctiva/sclera: Conjunctivae normal.  Neck:     Musculoskeletal: Normal range of motion and neck supple.  Cardiovascular:     Rate and  Rhythm: Normal rate and regular rhythm.     Pulses: Normal pulses.     Heart sounds: Normal heart sounds.  Pulmonary:     Effort: Pulmonary effort is normal. No respiratory distress.     Breath sounds: Normal breath sounds. No stridor.  Abdominal:     General: There is no distension.     Palpations: Abdomen is soft.     Tenderness: There is abdominal tenderness (Diffuse, general) in the suprapubic area. There is no guarding or rebound.     Hernia: No hernia is present.  Genitourinary:    Labia:        Right: No lesion.        Left: No lesion.      Uterus: Enlarged and tender.      Adnexa:        Right: Tenderness present. No mass or fullness.         Left: Tenderness present. No mass or fullness.       Comments: There is a moderate amount of blood in the vaginal canal.  Cervical os is closed.  There is generalized tenderness with midline fullness and worse pain in midline.  No distinct CMT.  Musculoskeletal:        General: No deformity.  Skin:    General: Skin is warm and dry.  Neurological:     General: No focal deficit present.     Mental Status: She is alert and oriented to person, place, and time.     Motor: No abnormal muscle tone.  Psychiatric:        Mood and Affect: Mood normal.        Behavior: Behavior normal.      ED Treatments / Results  Labs (all labs ordered are listed, but only abnormal results are displayed) Labs Reviewed  CBC - Abnormal; Notable for the following components:      Result Value   WBC 19.6 (*)    All other components within normal limits  COMPREHENSIVE METABOLIC PANEL - Abnormal; Notable for the following components:   CO2 19 (*)    Glucose, Bld 278 (*)    Creatinine, Ser 1.44 (*)    Calcium 8.5 (*)    Albumin 2.8 (*)    AST 14 (*)    GFR calc non Af Amer 38 (*)    GFR calc Af Amer 44 (*)    All other components within normal limits  URINALYSIS, ROUTINE W REFLEX MICROSCOPIC - Abnormal; Notable for the following components:   Color,  Urine RED (*)  APPearance TURBID (*)    Glucose, UA   (*)    Value: TEST NOT REPORTED DUE TO COLOR INTERFERENCE OF URINE PIGMENT   Hgb urine dipstick   (*)    Value: TEST NOT REPORTED DUE TO COLOR INTERFERENCE OF URINE PIGMENT   Bilirubin Urine   (*)    Value: TEST NOT REPORTED DUE TO COLOR INTERFERENCE OF URINE PIGMENT   Ketones, ur   (*)    Value: TEST NOT REPORTED DUE TO COLOR INTERFERENCE OF URINE PIGMENT   Protein, ur   (*)    Value: TEST NOT REPORTED DUE TO COLOR INTERFERENCE OF URINE PIGMENT   Nitrite   (*)    Value: TEST NOT REPORTED DUE TO COLOR INTERFERENCE OF URINE PIGMENT   Leukocytes,Ua   (*)    Value: TEST NOT REPORTED DUE TO COLOR INTERFERENCE OF URINE PIGMENT   All other components within normal limits  URINALYSIS, MICROSCOPIC (REFLEX) - Abnormal; Notable for the following components:   Bacteria, UA FEW (*)    All other components within normal limits  URINE CULTURE  WET PREP, GENITAL  LACTIC ACID, PLASMA  LACTIC ACID, PLASMA  DIFFERENTIAL  LIPASE, BLOOD  GC/CHLAMYDIA PROBE AMP ([place]) NOT AT [facility]    EKG EKG Interpretation  Date/Time:  Monday January 15 2019 20:04:49 EDT Ventricular Rate:  76 PR Interval:    QRS Duration: 148 QT Interval:  453 QTC Calculation: 510 R Axis:   22 Text Interpretation:  Sinus rhythm Right bundle branch block Confirmed by Virgel Manifold [phone]) on 01/15/2019 9:25:30 PM   Radiology No results found.  Procedures Procedures (including critical care time)  Medications Ordered in ED Medications  sodium chloride 0.9 % bolus 500 mL (has no administration in time range)  sodium chloride flush (NS) 0.9 % injection 10-40 mL (has no administration in time range)  morphine 4 MG/ML injection 4 mg (has no administration in time range)     Initial Impression / Assessment and Plan / ED Course  I have reviewed the triage vital signs and the nursing notes.  Pertinent labs & imaging results that were available during my care of  the patient were reviewed by me and considered in my medical decision making (see chart for details).       Patient presents today for evaluation of abdominal pain and vaginal bleeding for 3 weeks with dysuria and difficulties urinating.  On exam her abdomen is soft and generally tender.  She appears uncomfortable.  Labs are obtained and reviewed, she does have a leukocytosis with a white count of 19.  She is not significantly anemic.  Pelvic exam shows a moderate amount of blood in the vaginal canal.  UA pending.  Plan to evaluate with CT scan given the generalized nature of her pain, however I suspect that she may also need a ultrasound to further evaluate her vaginal bleeding given the amount of tenderness she has along with her leukocytosis.  He is afebrile, not tachycardic or tachypneic, does not meet Sirs or sepsis criteria.  Patient is a difficult stick which prolonged her stay in the emergency room.  At shift change care was transferred to Dr. Wilson Singer who will follow pending studies, re-evaulate and determine disposition.      Final Clinical Impressions(s) / ED Diagnoses   Final diagnoses:  Vaginal bleeding  Generalized abdominal pain    ED Discharge Orders    None       Lorin Glass, [place] 01/15/19 2138  Virgel Manifold, MD 01/17/19 0930

## 2019-01-15 NOTE — ED Notes (Signed)
Patient transported to CT 

## 2019-01-15 NOTE — ED Notes (Signed)
Pelvic cart set up at bedside  

## 2019-01-16 ENCOUNTER — Other Ambulatory Visit: Payer: Self-pay | Admitting: Internal Medicine

## 2019-01-16 NOTE — ED Provider Notes (Signed)
Patient signed out to me by Dr. Lajuan Lines had to follow-up CT scan.  Patient seen for vaginal bleeding.  She reportedly has a history of uterine fibroids.  She is, however, postmenopausal.  CT scan has been reviewed.  Endometrial findings concerning for endometrial carcinoma.  These findings were shared with the patient.  She sees Dr. Kennon Rounds at OB/GYN.  Will refer back for repeat evaluation for endometrial biopsy.  Will treat empirically for possible urinary tract infection.  Patient provided analgesia.   Orpah Greek, MD 01/16/19 [phone]

## 2019-01-17 ENCOUNTER — Telehealth: Payer: Self-pay | Admitting: Family Medicine

## 2019-01-17 ENCOUNTER — Telehealth: Payer: Self-pay | Admitting: Medical

## 2019-01-17 LAB — GC/CHLAMYDIA PROBE AMP (~~LOC~~) NOT AT ARMC
Chlamydia: NEGATIVE
Neisseria Gonorrhea: NEGATIVE

## 2019-01-17 NOTE — Telephone Encounter (Signed)
Patient stated she does not have access to the Internet to complete a mychart visit. Advised the visit will be a phone call.

## 2019-01-17 NOTE — Telephone Encounter (Signed)
The patient stated she was prescribed medication for pain in the ER yesterday however it's not working. Would like to explore other options. She stated she would like to speak with a nurse.

## 2019-01-17 NOTE — Telephone Encounter (Signed)
The patient called in crying, stated she only wanted to speak with Dr. Kennon Rounds. She also stated she visited the ER yesterday. She stated she was prescribed medication however its not working. She then placed her husband on the phone. He informed me the patient was supposed to have a hysterectomy a few years ago however she didn't desire to go through with it and he feels this is the outcome of the decsion. Informed of the virtual visit and if Dr. Kennon Rounds decides she should have the surgery an appointment will be scheduled.    Mailed the patient an appointment reminder letter.

## 2019-01-18 LAB — URINE CULTURE: Culture: >=100000 — AB

## 2019-01-19 ENCOUNTER — Telehealth: Payer: Self-pay | Admitting: Emergency Medicine

## 2019-01-19 NOTE — Telephone Encounter (Signed)
Post ED Visit - Positive Culture Follow-up  Culture report reviewed by antimicrobial stewardship pharmacist: [place] Team []  Elenor Quinones, Pharm.D. []  Heide Guile, Pharm.D., BCPS AQ-ID []  Parks Neptune, Pharm.D., BCPS []  Alycia Rossetti, Pharm.D., BCPS []  [place], Pharm.D., BCPS, AAHIVP []  Legrand Como, Pharm.D., BCPS, AAHIVP []  Salome Arnt, PharmD, BCPS []  Johnnette Gourd, PharmD, BCPS [x]  Hughes Better, PharmD, BCPS []  Leeroy Cha, PharmD []  Laqueta Linden, PharmD, BCPS []  Albertina Parr, PharmD  [place] Team []  Leodis Sias, PharmD []  Lindell Spar, PharmD []  Royetta Asal, PharmD []  Graylin Shiver, Rph []  Rema Fendt) Glennon Mac, PharmD []  Arlyn Dunning, PharmD []  Netta Cedars, PharmD []  Dia Sitter, PharmD []  Leone Haven, PharmD []  Gretta Arab, PharmD []  Theodis Shove, PharmD []  Peggyann Juba, PharmD []  Reuel Boom, PharmD   Positive urine culture Treated with Cephalexin, organism sensitive to the same and no further patient follow-up is required at this time.  Larene [place] Bernadine Melecio 01/19/2019, 9:06 AM

## 2019-01-23 ENCOUNTER — Telehealth: Payer: Self-pay | Admitting: *Deleted

## 2019-01-23 NOTE — Telephone Encounter (Signed)
I called [name] and we discussed this- please see telephone call dated 01/23/19

## 2019-01-23 NOTE — Telephone Encounter (Signed)
Etherine left a message this am that she is wanting to know if her appt 6/25 can be moved up because she is having a lot of problems- bleeding, cramping, pain. I discussed with registrar and there are no appointments before then.  I called [place] and explained we do not have any appointments before hers on 6/24 at 0915. She states she thought hers was 6/25. I reviewed that hers is 6/24 0915 by telephone because she cannot do virtual because no internet - she verified. We discussed she had been to the ED and was given pain med ( vicodin) which she says do not help. She states she is taking pamprin and it helps a little. We discussed that we usually advise Ibuprofen but she states her pcp told her not to take ibuprofen because of her kidneys. I advised her I was not sure if pamprin was ok for her kidneys and to please check with her PCP. I also informed her Dr. Kennon Rounds will discuss with her what meds she can take and may order tests.  I advised her if her pain was severe or heav bleeding to go to ER ; otherwise keep her appointment for 6/24 as scheduled. She voices understanding.  Linda,RN

## 2019-01-25 ENCOUNTER — Encounter: Payer: Self-pay | Admitting: Family Medicine

## 2019-01-25 ENCOUNTER — Other Ambulatory Visit: Payer: Self-pay

## 2019-01-25 ENCOUNTER — Ambulatory Visit (INDEPENDENT_AMBULATORY_CARE_PROVIDER_SITE_OTHER): Payer: Medicare Other | Admitting: Family Medicine

## 2019-01-25 DIAGNOSIS — N950 Postmenopausal bleeding: Secondary | ICD-10-CM

## 2019-01-25 MED ORDER — MEGESTROL ACETATE 40 MG PO TABS: 40.0000 mg | ORAL_TABLET | Freq: Two times a day (BID) | ORAL | 1 refills | Status: DC

## 2019-01-25 NOTE — Progress Notes (Signed)
TELEHEALTH VIRTUAL GYNECOLOGY VISIT ENCOUNTER NOTE  I connected with [name] on 01/25/19 at  9:15 AM EDT by telephone at home and verified that I am speaking with the correct person using two identifiers.   I discussed the limitations, risks, security and privacy concerns of performing an evaluation and management service by telephone and the availability of in person appointments. I also discussed with the patient that there may be a patient responsible charge related to this service. The patient expressed understanding and agreed to proceed.   History:  [name] is a 63 y.o. G0P0000 female being evaluated today for abnormal bleeding and endometrial hyperplasia with atypia. Has IUD placed in March and needs repeat sampling. Pain and bleeding since June, pain is 10 in low back and base of stomach and constant and soaking 6-7 adult diapers/day. Pain is cramping and bringing her to tears. Pamprin is helping. Has IUD in place. Worse bleeding since then. She does not have any more Megace.     Past Medical History:  Diagnosis Date   Blind right eye    Chronic back pain    "my whole back" (03/18/2015)   Chronic chest pain    takes isosorbide   Chronic diastolic heart failure ([place]) 9/13   echo 03/20/15 LV Ef of 60-65%, grade 2DD and PA peak pressure: 36 mm Hg    CKD (chronic kidney disease), stage III ([place])    Coronary artery disease cardiologist-- dr croitoru   per cardiac cath 02-07-2009 -- diffuse 3V CAD involving RCA, CFx, and D1,  pt not a candidate for bypass surgery or angioplasty,  medical management   Diabetic neuropathy ([place])    Diabetic retinopathy ([place])    bilateral proliferative   Diastolic CHF, chronic ([place])    Dyspnea    "always"   Edema of both lower extremities    Generalized weakness    GERD (gastroesophageal reflux disease)    Glaucoma, both eyes    History of non-ST elevation myocardial infarction (NSTEMI)    02-25-2009;  05-15-2009;   01-08-2010;  04-04-2012 this MI was post-op surgery for abscess on 04-03-2012   History of sepsis    Hyperlipemia    Hypertension    Insulin dependent type 2 diabetes mellitus ([place])    followed by pcp   Iron deficiency anemia due to chronic blood loss    uterine bleeding   Legally blind in left eye, as defined in [place]    per pt states vision "is foggy"   Mild obstructive sleep apnea    study in epic 04-19-2012 mild osa,  recommendation's were wt. loss, dental  appliance, surgery, or cpap   Morbid obesity with BMI of 40.0-44.9, adult ([place])    OA (osteoarthritis)    knees   PSVT (paroxysmal supraventricular tachycardia) ([place])    Renal insufficiency    pt. denies   Seasonal allergies    Wears glasses    Past Surgical History:  Procedure Laterality Date   BIOPSY  02/13/2018   Procedure: BIOPSY;  Surgeon: Ladene Artist, MD;  Location: [facility];  Service: Endoscopy;;   BREAST SURGERY Right 2019    breast biopsy,  per pt benign   CARDIAC CATHETERIZATION  02-27-2009   dr al little   diffuse 3V CAD , involving RCA, CFx, and D1;  inferior wall abnormalities, low normal ef 50%   CATARACT EXTRACTION Right    CATARACT EXTRACTION W/ INTRAOCULAR LENS IMPLANT Left 10-11-2007   @[facility]   COLONOSCOPY WITH PROPOFOL N/A 02/13/2018   Procedure: COLONOSCOPY WITH PROPOFOL;  Surgeon: Ladene Artist, MD;  [facility]: [facility];  Service: Endoscopy;  Laterality: N/A;   DILATION AND CURETTAGE OF UTERUS  x2 last one 2000   EYE SURGERY Bilateral 2009 to 2010   x2  left eye;  x2  right eye    HYSTEROSCOPY W/D&C N/A 10/11/2018   Procedure: DILATATION AND CURETTAGE /HYSTEROSCOPY;  Surgeon: [name], MD;  Location: [facility];  Service: Gynecology;  Laterality: N/A;   INCISION AND DRAINAGE PERIRECTAL ABSCESS  04/03/2012   INTRAUTERINE DEVICE (IUD) INSERTION  10/11/2018   Procedure: INTRAUTERINE DEVICE (IUD) INSERTION;  Surgeon: [name], MD;  Location: [facility];  Service:  Gynecology;;   IR US GUIDE VASC ACCESS LEFT  02/13/2018   IR VENIPUNCTURE 19YRS/OLDER BY MD  02/13/2018   POLYPECTOMY  02/13/2018   Procedure: POLYPECTOMY;  Surgeon: Ladene Artist, MD;  [facility]: [facility];  Service: Endoscopy;;   The following portions of the patient's history were reviewed and updated as appropriate: allergies, current medications, past family history, past medical history, past social history, past surgical history and problem list.   Health Maintenance:  Normal pap and negative HRHPV on 07/10/2018.  Last  mammogram on 12/12/2017.   Review of Systems:  Pertinent items noted in HPI and remainder of comprehensive ROS otherwise negative.  Physical Exam:   General:  Alert, oriented and cooperative.   Mental Status: Normal mood and affect perceived. Normal judgment and thought content.  Physical exam deferred due to nature of the encounter  Labs and Imaging Results for orders placed or performed during the hospital encounter of 01/15/19 (from the past 336 hour(s))  GC/Chlamydia probe amp ([place]) not at [facility]   Collection Time: 01/15/19 12:00 AM  Result Value Ref Range   Chlamydia Negative    Neisseria gonorrhea Negative   CBC   Collection Time: 01/15/19  5:45 PM  Result Value Ref Range   WBC 19.6 (H) 4.0 - 10.5 K/uL   RBC 4.15 3.87 - 5.11 MIL/uL   Hemoglobin 12.3 12.0 - 15.0 g/dL   HCT 38.5 36.0 - 46.0 %   MCV 92.8 80.0 - 100.0 fL   MCH 29.6 26.0 - 34.0 pg   MCHC 31.9 30.0 - 36.0 g/dL   RDW 14.4 11.5 - 15.5 %   Platelets 276 150 - 400 K/uL   nRBC 0.0 0.0 - 0.2 %  Comprehensive metabolic panel   Collection Time: 01/15/19  5:45 PM  Result Value Ref Range   Sodium 137 135 - 145 mmol/L   Potassium 4.1 3.5 - 5.1 mmol/L   Chloride 108 98 - 111 mmol/L   CO2 19 (L) 22 - 32 mmol/L   Glucose, Bld 278 (H) 70 - 99 mg/dL   BUN 12 8 - 23 mg/dL   Creatinine, Ser 1.44 (H) 0.44 - 1.00 mg/dL   Calcium 8.5 (L) 8.9 - 10.3 mg/dL   Total Protein 7.4 6.5 - 8.1 g/dL     Albumin 2.8 (L) 3.5 - 5.0 g/dL   AST 14 (L) 15 - 41 U/L   ALT 14 0 - 44 U/L   Alkaline Phosphatase 68 38 - 126 U/L   Total Bilirubin 0.6 0.3 - 1.2 mg/dL   GFR calc non Af Amer 38 (L) >60 mL/min   GFR calc Af Amer 44 (L) >60 mL/min   Anion gap 10 5 - 15  Differential   Collection Time: 01/15/19  5:45  PM  Result Value Ref Range   Neutrophils Relative % 81 %   Neutro Abs 15.8 (H) 1.7 - 7.7 K/uL   Lymphocytes Relative 12 %   Lymphs Abs 2.3 0.7 - 4.0 K/uL   Monocytes Relative 6 %   Monocytes Absolute 1.2 (H) 0.1 - 1.0 K/uL   Eosinophils Relative 0 %   Eosinophils Absolute 0.1 0.0 - 0.5 K/uL   Basophils Relative 0 %   Basophils Absolute 0.1 0.0 - 0.1 K/uL  Urine culture   Collection Time: 01/15/19  8:10 PM   Specimen: Urine, Clean Catch  Result Value Ref Range   Specimen Description URINE, CLEAN CATCH    Special Requests      NONE Performed at [facility], [place] [address] [place], [place] [address]    Culture >=100,000 COLONIES/mL PROTEUS MIRABILIS (A)    Report Status 01/18/2019 FINAL    Organism ID, Bacteria PROTEUS MIRABILIS (A)       Susceptibility   Proteus mirabilis - MIC*    AMPICILLIN <=2 SENSITIVE Sensitive     CEFAZOLIN <=4 SENSITIVE Sensitive     CEFTRIAXONE <=1 SENSITIVE Sensitive     CIPROFLOXACIN <=0.25 SENSITIVE Sensitive     GENTAMICIN <=1 SENSITIVE Sensitive     IMIPENEM 1 SENSITIVE Sensitive     NITROFURANTOIN 128 RESISTANT Resistant     TRIMETH/SULFA <=20 SENSITIVE Sensitive     AMPICILLIN/SULBACTAM <=2 SENSITIVE Sensitive     PIP/TAZO <=4 SENSITIVE Sensitive     * >=100,000 COLONIES/mL PROTEUS MIRABILIS  Urinalysis, Routine w reflex microscopic   Collection Time: 01/15/19  8:10 PM  Result Value Ref Range   Color, Urine RED (A) YELLOW   APPearance TURBID (A) CLEAR   Specific Gravity, Urine  1.005 - 1.030    TEST NOT REPORTED DUE TO COLOR INTERFERENCE OF URINE PIGMENT   pH  5.0 - 8.0    TEST NOT REPORTED DUE TO COLOR INTERFERENCE OF URINE  PIGMENT   Glucose, UA (A) NEGATIVE mg/dL    TEST NOT REPORTED DUE TO COLOR INTERFERENCE OF URINE PIGMENT   Hgb urine dipstick (A) NEGATIVE    TEST NOT REPORTED DUE TO COLOR INTERFERENCE OF URINE PIGMENT   Bilirubin Urine (A) NEGATIVE    TEST NOT REPORTED DUE TO COLOR INTERFERENCE OF URINE PIGMENT   Ketones, ur (A) NEGATIVE mg/dL    TEST NOT REPORTED DUE TO COLOR INTERFERENCE OF URINE PIGMENT   Protein, ur (A) NEGATIVE mg/dL    TEST NOT REPORTED DUE TO COLOR INTERFERENCE OF URINE PIGMENT   Nitrite (A) NEGATIVE    TEST NOT REPORTED DUE TO COLOR INTERFERENCE OF URINE PIGMENT   Leukocytes,Ua (A) NEGATIVE    TEST NOT REPORTED DUE TO COLOR INTERFERENCE OF URINE PIGMENT  Urinalysis, Microscopic (reflex)   Collection Time: 01/15/19  8:10 PM  Result Value Ref Range   RBC / HPF >50 0 - 5 RBC/hpf   WBC, UA >50 0 - 5 WBC/hpf   Bacteria, UA FEW (A) NONE SEEN   Squamous Epithelial / LPF 0-5 0 - 5  Wet prep, genital   Collection Time: 01/15/19  8:21 PM   Specimen: Vaginal  Result Value Ref Range   Yeast Wet Prep HPF POC NONE SEEN NONE SEEN   Trich, Wet Prep NONE SEEN NONE SEEN   Clue Cells Wet Prep HPF POC NONE SEEN NONE SEEN   WBC, Wet Prep HPF POC MODERATE (A) NONE SEEN   Sperm NONE SEEN   Lipase, blood  Collection Time: 01/15/19  9:38 PM  Result Value Ref Range   Lipase 23 11 - 51 U/L  Lactic acid, plasma   Collection Time: 01/15/19  9:50 PM  Result Value Ref Range   Lactic Acid, Venous 1.3 0.5 - 1.9 mmol/L   Ct Abdomen Pelvis Wo Contrast  Result Date: 12/28/2018 CLINICAL DATA:  Abdominal pain, suspect colitis. Hysteroscopy with D and C 10/11/2018. IUD EXAM: CT ABDOMEN AND PELVIS WITHOUT CONTRAST TECHNIQUE: Multidetector CT imaging of the abdomen and pelvis was performed following the standard protocol without IV contrast. COMPARISON:  Ultrasound pelvis 12/09/2015, CT abdomen pelvis 04/03/2012 FINDINGS: Lower chest: Lung bases clear bilaterally.  Cardiac enlargement. Hepatobiliary: No  focal liver abnormality is seen. No gallstones, gallbladder wall thickening, or biliary dilatation. Pancreas: Negative Spleen: Negative Adrenals/Urinary Tract: Adrenal glands are unremarkable. Kidneys are normal, without renal calculi, focal lesion, or hydronephrosis. Bladder is unremarkable. Stomach/Bowel: Negative for bowel obstruction or ileus. No bowel wall edema or mass. Appendix not visualized. Vascular/Lymphatic: Mild atherosclerotic aorta. Negative for aneurysm. No enlarged lymph nodes. Reproductive: Enlarged uterus. Scattered small calcifications the uterus due to fibroid uterus. Largest fibroid left fundus measures 3.8 x 6.7 cm with mild enlargement since 2013. Right adnexal cyst 30 x 50 mm. IUD in the anterior fundus to the right of midline. Other: Negative for free fluid. Musculoskeletal: No acute skeletal abnormality. IMPRESSION: No acute abnormality. Negative for colitis. Appendix not visualized Enlarged uterus with fibroid changes. Right adnexal cyst 30 x 50 mm. IUD in the anterior fundus on the right. Electronically Signed   By: Franchot Gallo M.D.   On: 12/28/2018 16:39   Ct Abdomen Pelvis W Contrast  Result Date: 01/15/2019 CLINICAL DATA:  Vaginal bleeding, abdominal pain EXAM: CT ABDOMEN AND PELVIS WITH CONTRAST TECHNIQUE: Multidetector CT imaging of the abdomen and pelvis was performed using the standard protocol following bolus administration of intravenous contrast. CONTRAST:  115mL OMNIPAQUE IOHEXOL 300 MG/ML  SOLN COMPARISON:  12/28/2018 FINDINGS: Lower chest: Coronary artery calcifications.  No acute abnormality. Hepatobiliary: No focal hepatic abnormality. Gallbladder unremarkable. Pancreas: No focal abnormality or ductal dilatation. Spleen: No focal abnormality.  Normal size. Adrenals/Urinary Tract: No adrenal abnormality. No focal renal abnormality. No stones or hydronephrosis. Urinary bladder is unremarkable. Stomach/Bowel: Stomach, large and small bowel grossly unremarkable.  Vascular/Lymphatic: Aortic atherosclerosis. No enlarged abdominal or pelvic lymph nodes. Reproductive: IUD noted in place within a markedly thickened endometrium, measuring 4.5 cm in thickness on sagittal image 54. Large left fundal exophytic mass measures approximately 9 cm, likely exophytic fibroid. 4.3 cm cystic area in the right ovary, stable. Other: No free fluid or free air. Musculoskeletal: No acute bony abnormality. IMPRESSION: Enlarged uterus with markedly thickened endometrial stripe measuring up to 4.5 cm. IUD is noted within the endometrium. Appearance is concerning for possible endometrial cancer. Recommend GYN consultation and correlation with prior reported endometrial biopsy. 4.3 cm right ovarian cystic lesion, similar to prior study. Probable exophytic left fundal fibroid. Aortic atherosclerosis. Electronically Signed   By: Rolm Baptise M.D.   On: 01/15/2019 22:29      Assessment and Plan:     1. Post-menopausal bleeding bleedng and to ED x 2. Not anemic. Has IUD. Endometrial stripe is bigger, worrisome for change to cancer. Have added Megace to help with pain and bleeding. Will have pt. Return for EMB with referral to GYN.ONC if cancer is detected. - megestrol (MEGACE) 40 MG tablet; Take 1 tablet (40 mg total) by mouth 2 (two) times daily.  Dispense: 60  tablet; Refill: 1       I discussed the assessment and treatment plan with the patient. The patient was provided an opportunity to ask questions and all were answered. The patient agreed with the plan and demonstrated an understanding of the instructions.   The patient was advised to call back or seek an in-person evaluation/go to the ED if the symptoms worsen or if the condition fails to improve as anticipated.  I provided 12 minutes of non-face-to-face time during this encounter.   [name], MD [facility], [place]

## 2019-01-31 ENCOUNTER — Other Ambulatory Visit: Payer: Self-pay | Admitting: Cardiovascular Disease

## 2019-02-02 DIAGNOSIS — R079 Chest pain, unspecified: Secondary | ICD-10-CM | POA: Diagnosis not present

## 2019-02-02 DIAGNOSIS — R269 Unspecified abnormalities of gait and mobility: Secondary | ICD-10-CM | POA: Diagnosis not present

## 2019-02-02 DIAGNOSIS — M170 Bilateral primary osteoarthritis of knee: Secondary | ICD-10-CM | POA: Diagnosis not present

## 2019-02-02 DIAGNOSIS — Z9181 History of falling: Secondary | ICD-10-CM | POA: Diagnosis not present

## 2019-02-16 IMAGING — CT CT CHEST W/O CM
2 of 4 series · 15 of 36 positions shown, 18 images · non-contrast
Comparison: Chest radiograph September 09, 2016; chest CT [place]

CLINICAL DATA: Cough and shortness of breath. Nodular opacity on
recent chest radiograph

EXAM:
CT CHEST WITHOUT CONTRAST
TECHNIQUE: Multidetector CT imaging of the chest was performed following the
standard protocol without IV contrast.

[Series 2: thorax · axial · 0.73mm/px · z∈[-298,-16]mm · 12 of 165 slices shown, 15 images]
[im 12/165  mediastinal]
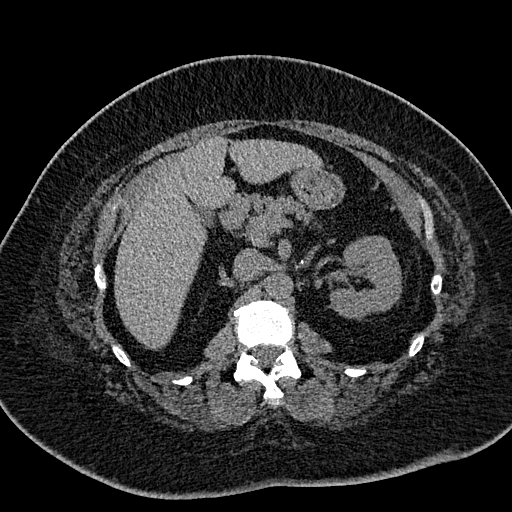
[im 12/165  lung]
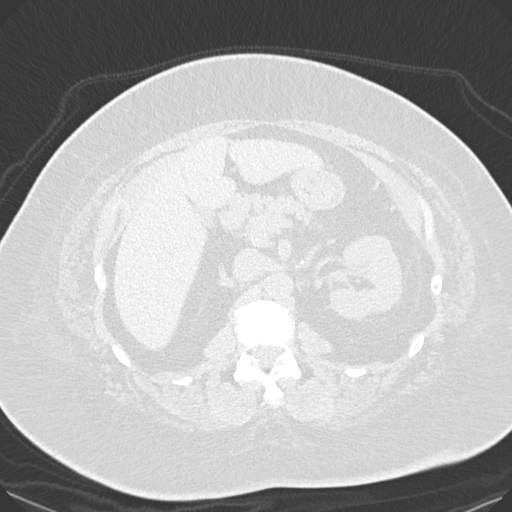
[im 24/165  lung]
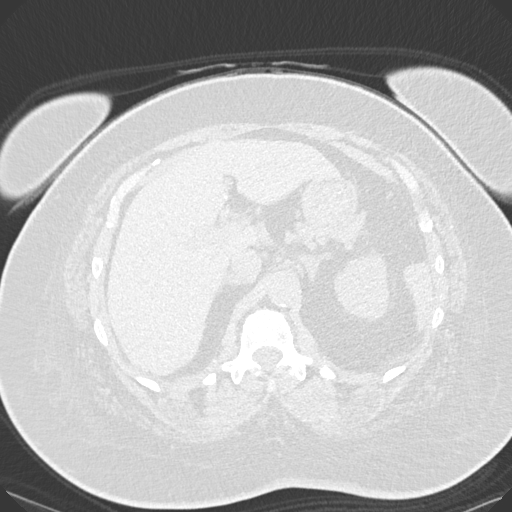
[im 36/165  lung]
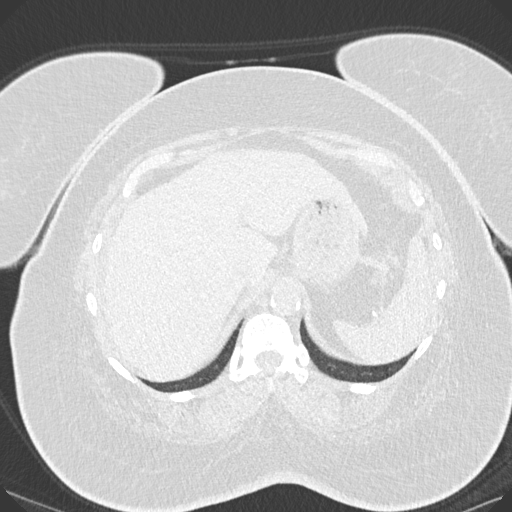
[im 47/165  lung]
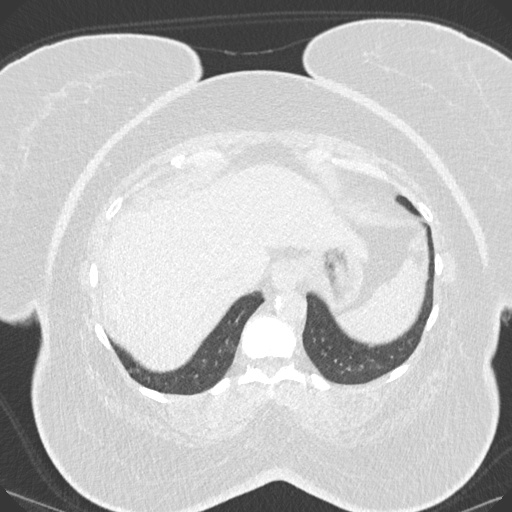
[im 59/165  mediastinal]
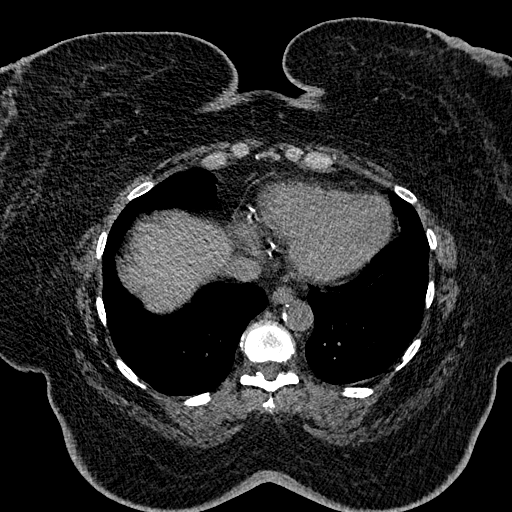
[im 59/165  lung]
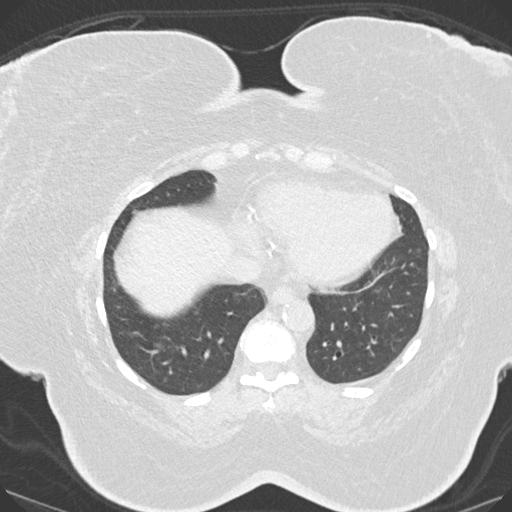
[im 71/165  lung]
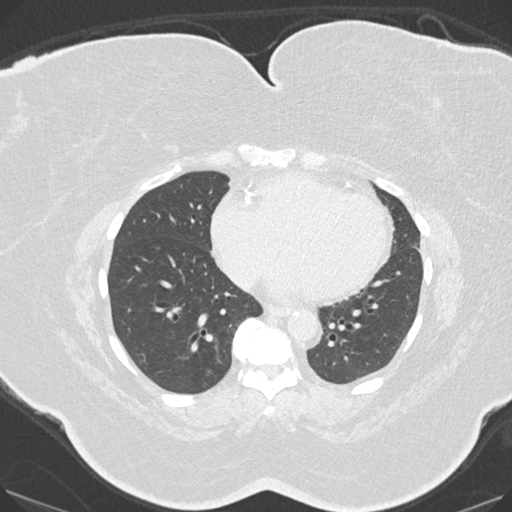
[im 94/165  lung]
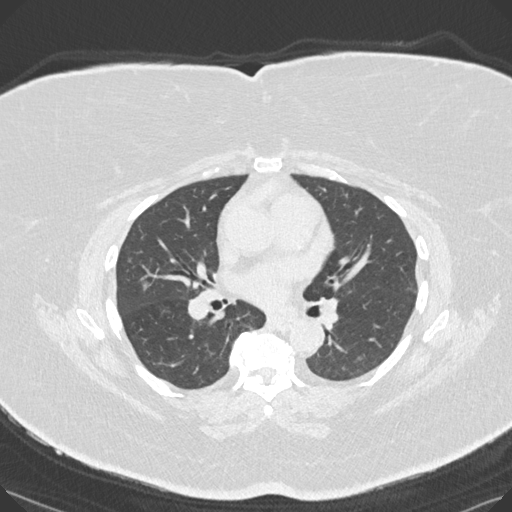
[im 106/165  lung]
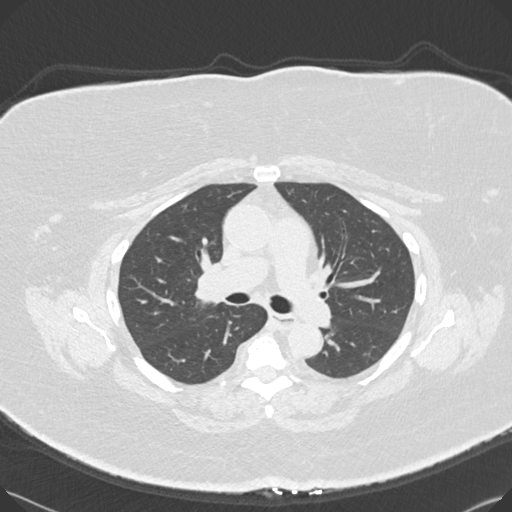
[im 118/165  mediastinal]
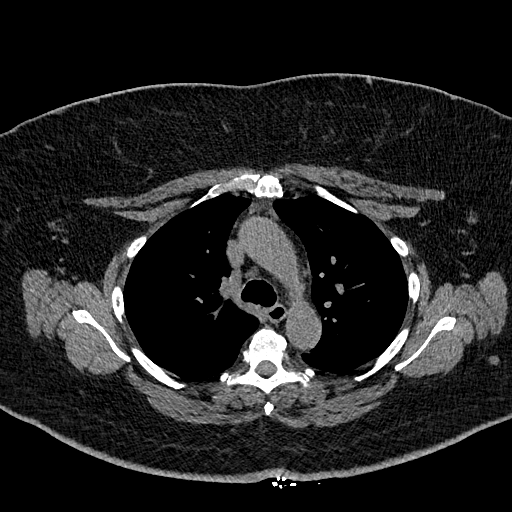
[im 118/165  lung]
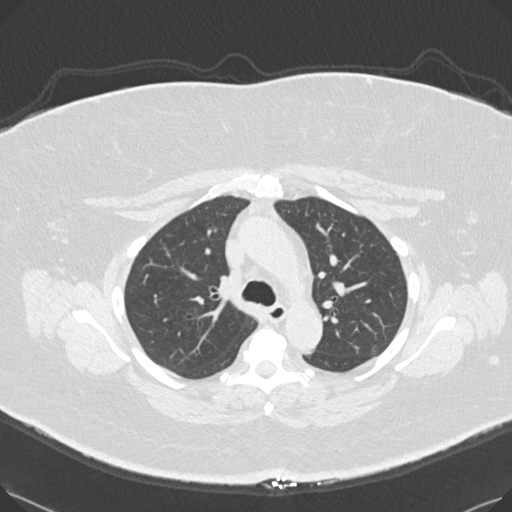
[im 129/165  lung]
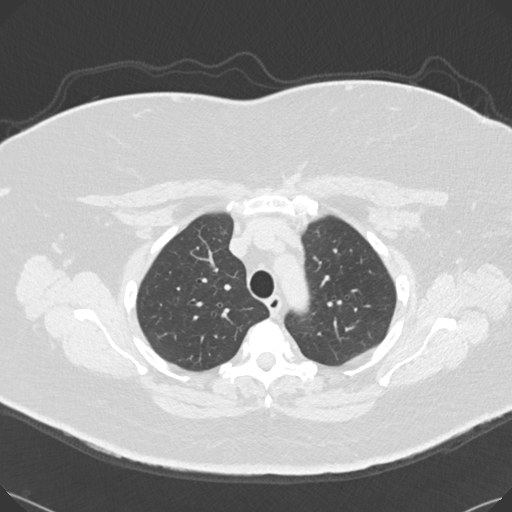
[im 141/165  lung]
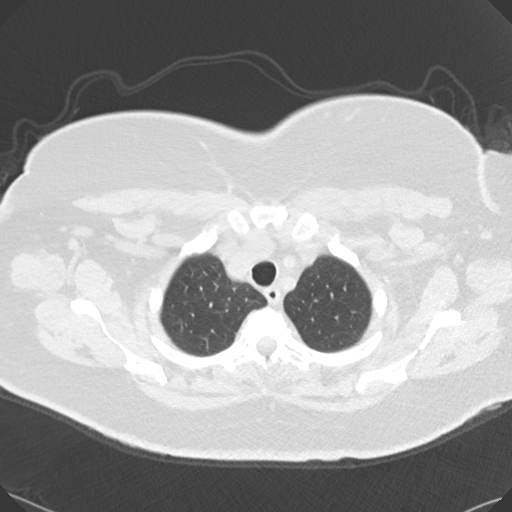
[im 153/165  lung]
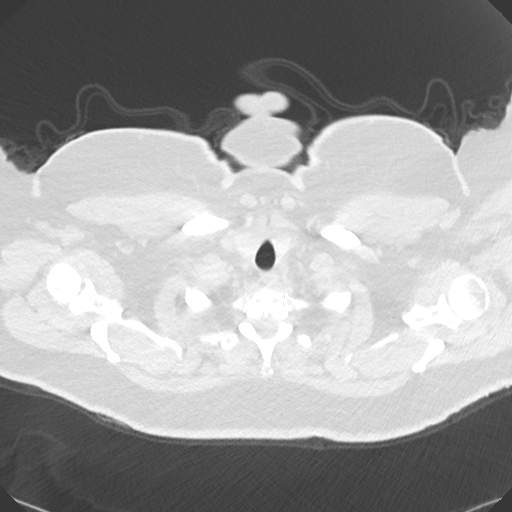

[Series 6: coronal · coronal · 0.66mm/px · 3 of 137 slices shown]
[im 28/137  lung]
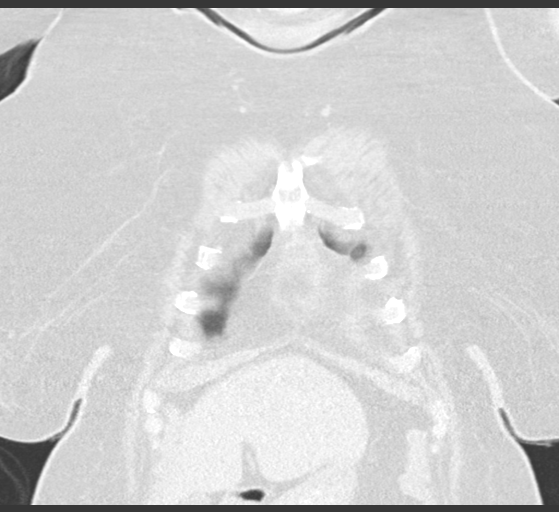
[im 55/137  lung]
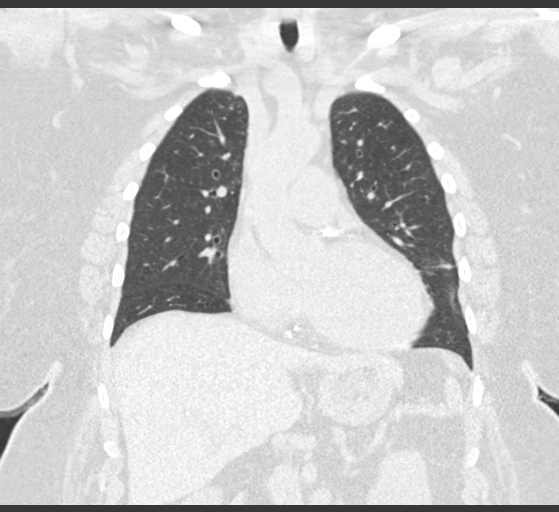
[im 82/137  lung]
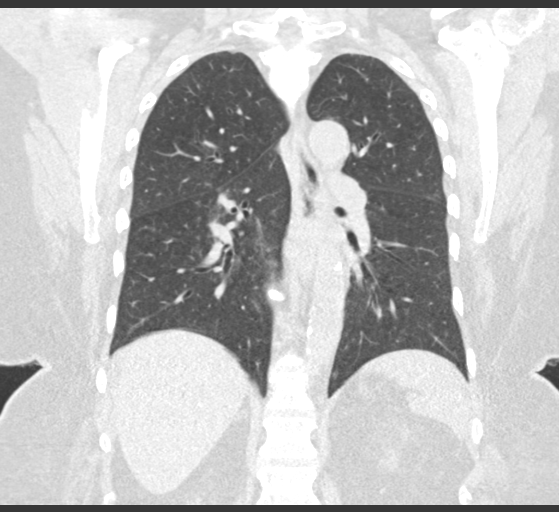

[15 of 36 positions shown; findings below may reference images not displayed]

FINDINGS: Cardiovascular: There is no demonstrable thoracic aortic aneurysm.
Visualized great vessels appear unremarkable on this noncontrast
enhanced study except for slight calcification in the proximal left
subclavian artery. There are scattered foci of calcification in the
aorta. There are multiple foci of coronary artery calcification.
Pericardium is not thickened.

Mediastinum/Nodes: Thyroid appears normal. There are a few
subcentimeter mediastinal lymph nodes. No adenopathy is evident by
size criteria. There is a small hiatal hernia.

Lungs/Pleura: On axial slice 72 series 5, there is a ground-glass
type nodular opacity in the posterior segment of the right upper
lobe measuring 6 x 6 mm. On axial slice 82 series 5, there is a
nodular appearing ground-glass type opacity measuring 4 x 4 mm in
the superior segment of the left lower lobe. There is slight
bibasilar atelectasis. No edema or consolidation. No pleural
effusion or pleural thickening evident.

Upper Abdomen: In the visualized upper abdomen, there is
atherosclerotic calcification aorta. Visualized upper abdominal
structures otherwise appear unremarkable.

Musculoskeletal: There is degenerative change in the thoracic spine.
There are no evident blastic or lytic bone lesions.
IMPRESSION: Ground-glass nodular opacity in the posterior segment of the right
upper lobe measuring 6 x 6 mm, likely corresponding to the nodular
opacity seen on chest radiograph. There is a second ground-glass
appearing nodular opacity in the left lower lobe superior segment
measuring 4 x 4 mm. Initial follow-up with CT at 6-12 months is
recommended to confirm persistence. If persistent, repeat CT is
recommended every 2 years until 5 years of stability has been
established. This recommendation follows the consensus statement:
Guidelines for Management of Incidental Pulmonary Nodules Detected
[DATE].

No edema or consolidation. No adenopathy. Areas of atherosclerotic
calcification. Note that there are multiple foci of coronary artery
calcification.

## 2019-02-21 ENCOUNTER — Other Ambulatory Visit: Payer: Self-pay | Admitting: Internal Medicine

## 2019-02-21 ENCOUNTER — Other Ambulatory Visit: Payer: Self-pay | Admitting: Cardiovascular Disease

## 2019-02-21 DIAGNOSIS — E876 Hypokalemia: Secondary | ICD-10-CM

## 2019-02-21 DIAGNOSIS — I2510 Atherosclerotic heart disease of native coronary artery without angina pectoris: Secondary | ICD-10-CM

## 2019-02-26 ENCOUNTER — Other Ambulatory Visit: Payer: Self-pay | Admitting: Internal Medicine

## 2019-02-26 DIAGNOSIS — E1143 Type 2 diabetes mellitus with diabetic autonomic (poly)neuropathy: Secondary | ICD-10-CM

## 2019-03-05 DIAGNOSIS — R079 Chest pain, unspecified: Secondary | ICD-10-CM | POA: Diagnosis not present

## 2019-03-05 DIAGNOSIS — Z9181 History of falling: Secondary | ICD-10-CM | POA: Diagnosis not present

## 2019-03-05 DIAGNOSIS — M170 Bilateral primary osteoarthritis of knee: Secondary | ICD-10-CM | POA: Diagnosis not present

## 2019-03-05 DIAGNOSIS — R269 Unspecified abnormalities of gait and mobility: Secondary | ICD-10-CM | POA: Diagnosis not present

## 2019-03-07 ENCOUNTER — Other Ambulatory Visit: Payer: Self-pay | Admitting: Internal Medicine

## 2019-03-07 DIAGNOSIS — I2510 Atherosclerotic heart disease of native coronary artery without angina pectoris: Secondary | ICD-10-CM

## 2019-03-14 ENCOUNTER — Other Ambulatory Visit: Payer: Self-pay | Admitting: Internal Medicine

## 2019-03-14 DIAGNOSIS — M62838 Other muscle spasm: Secondary | ICD-10-CM

## 2019-03-14 MED ORDER — BACLOFEN 10 MG PO TABS: 10.0000 mg | ORAL_TABLET | Freq: Every evening | ORAL | 1 refills | Status: DC | PRN

## 2019-03-14 NOTE — Telephone Encounter (Signed)
1) Medication(s) Requested (by name): baclofen (LIORESAL) 10 MG tablet [[id number]  2) Pharmacy of Choice: [facility], [address] 3) Special Requests:   Approved medications will be sent to the pharmacy, we will reach out if there is an issue.  Requests made after 3pm may not be addressed until the following business day!  If a patient is unsure of the name of the medication(s) please note and ask patient to call back when they are able to provide all info, do not send to responsible party until all information is available!

## 2019-03-26 ENCOUNTER — Telehealth: Payer: Self-pay | Admitting: *Deleted

## 2019-03-26 ENCOUNTER — Other Ambulatory Visit: Payer: Self-pay | Admitting: Cardiovascular Disease

## 2019-03-26 ENCOUNTER — Telehealth: Payer: Self-pay | Admitting: Family Medicine

## 2019-03-26 ENCOUNTER — Other Ambulatory Visit: Payer: Self-pay | Admitting: Internal Medicine

## 2019-03-26 DIAGNOSIS — I2510 Atherosclerotic heart disease of native coronary artery without angina pectoris: Secondary | ICD-10-CM

## 2019-03-26 DIAGNOSIS — E876 Hypokalemia: Secondary | ICD-10-CM

## 2019-03-26 DIAGNOSIS — N950 Postmenopausal bleeding: Secondary | ICD-10-CM

## 2019-03-26 MED ORDER — MEGESTROL ACETATE 40 MG PO TABS: 40.0000 mg | ORAL_TABLET | Freq: Two times a day (BID) | ORAL | 1 refills | Status: DC

## 2019-03-26 NOTE — Telephone Encounter (Signed)
Called and spoke to patient she is aware of her upcoming appointment on 03-28-2019.

## 2019-03-26 NOTE — Telephone Encounter (Signed)
Pt left message requesting refill of Megestrol. I returned the call and left a message stating that a refill has been sent to her [place]. Per chart review, pt needs follow up appt in office per Dr. Virginia Crews note on 6/25 and therefore I also stated in my message that someone would be calling her to schedule the appointment. She may call back if she has additional questions.

## 2019-03-27 ENCOUNTER — Telehealth: Payer: Self-pay

## 2019-03-27 NOTE — Telephone Encounter (Signed)
Pt called and stated that she received a call from our office and she has an appt tomorrow.  I explained to the pt that we calling to make sure that she knows about her appt tomorrow.  Pt stated yes she knows about her appt and will be here tomorrow.

## 2019-03-28 ENCOUNTER — Encounter: Payer: Self-pay | Admitting: Family Medicine

## 2019-03-28 ENCOUNTER — Ambulatory Visit (INDEPENDENT_AMBULATORY_CARE_PROVIDER_SITE_OTHER): Payer: Medicare Other | Admitting: Family Medicine

## 2019-03-28 ENCOUNTER — Other Ambulatory Visit: Payer: Self-pay

## 2019-03-28 ENCOUNTER — Other Ambulatory Visit (HOSPITAL_COMMUNITY)
Admission: RE | Admit: 2019-03-28 | Discharge: 2019-03-28 | Disposition: A | Discharge status: Home / Self Care | Payer: Medicare Other | Source: Ambulatory Visit | Attending: Family Medicine | Admitting: Family Medicine

## 2019-03-28 VITALS — BP 148/90 | HR 92 | Wt 272.0 lb

## 2019-03-28 DIAGNOSIS — N8502 Endometrial intraepithelial neoplasia [EIN]: Secondary | ICD-10-CM

## 2019-03-28 NOTE — Patient Instructions (Signed)

## 2019-03-28 NOTE — Progress Notes (Signed)
   Subjective:    Patient ID: [name] is a 63 y.o. female presenting with Follow-up  on 03/28/2019  HPI: S/p D and C hysteroscopy with IUD placement 10/2018. Pathology showed complex hyperplasia with atypica. Reviewed with Dr. Denman George, and for repeat sampling and if normal will just continue IUD. Having bleeding in June and increased size of endometrium at that time. Return now without anymore bleeding.  Review of Systems  Constitutional: Negative for chills and fever.  Respiratory: Negative for shortness of breath.   Cardiovascular: Negative for chest pain.  Gastrointestinal: Negative for abdominal pain, nausea and vomiting.  Genitourinary: Negative for dysuria.  Skin: Negative for rash.      Objective:    BP (!) 148/90   Pulse 92   Wt 272 lb (123.4 kg)   LMP 04/02/2012   BMI 53.12 kg/m  Physical Exam Constitutional:      General: She is not in acute distress.    Appearance: She is well-developed.  HENT:     Head: Normocephalic and atraumatic.  Eyes:     General: No scleral icterus. Neck:     Musculoskeletal: Neck supple.  Cardiovascular:     Rate and Rhythm: Normal rate.  Pulmonary:     Effort: Pulmonary effort is normal.  Abdominal:     Palpations: Abdomen is soft.  Genitourinary:    General: Normal vulva.     Vagina: Normal.     Cervix: Normal.     Comments: IUD strings not seen Skin:    General: Skin is warm and dry.  Neurological:     Mental Status: She is alert and oriented to person, place, and time.   Procedure: Patient given informed consent, signed copy in the chart, time out was performed. Appropriate time out taken. . The patient was placed in the lithotomy position and the cervix brought into view with sterile speculum.  Portio of cervix cleansed x 2 with betadine swabs.  A tenaculum was placed in the anterior lip of the cervix.  The uterus was sounded for depth of 7 cm. A pipelle was introduced to into the uterus, suction created,  and an  endometrial sample was obtained. All equipment was removed and accounted for.  The patient tolerated the procedure well.       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      Relevant Orders   Surgical pathology( [facility])      Return in about 3 months (around 06/28/2019) for a follow-up.  [name] 03/28/2019 3:39 PM

## 2019-03-28 NOTE — Assessment & Plan Note (Signed)
S/p EMB today--await pathology and continue IUD.

## 2019-03-31 NOTE — Addendum Note (Signed)
Addended by: [name] on: 03/31/2019 08:45 AM   Modules accepted: Orders

## 2019-04-02 ENCOUNTER — Telehealth: Payer: Self-pay | Admitting: *Deleted

## 2019-04-02 NOTE — Telephone Encounter (Signed)
I called [name] and informed her results per Dr. Kennon Rounds and that she is to call if she has any bleeding. She denies any bleeding at present and voices understanding. I reviewed her [name] appointment  With her . She voices understanding. She also asked if she is to keep taking the megace . I informed her yes, to keep taking the megace- call us if she is close to running out or if she has any bleeding. She voices understanding.  [place] Semrad,RN

## 2019-04-02 NOTE — Telephone Encounter (Signed)
-----   Message from [name], MD sent at 03/31/2019  8:45 AM EDT ----- Biopsy was negative, though lacked a lot of tissue--call immediately if she has further bleeding. Let's f/u with an ultrasound in 3 months. Order placed

## 2019-04-04 ENCOUNTER — Other Ambulatory Visit: Payer: Self-pay | Admitting: Internal Medicine

## 2019-04-04 DIAGNOSIS — I10 Essential (primary) hypertension: Secondary | ICD-10-CM

## 2019-04-05 DIAGNOSIS — R269 Unspecified abnormalities of gait and mobility: Secondary | ICD-10-CM | POA: Diagnosis not present

## 2019-04-05 DIAGNOSIS — M170 Bilateral primary osteoarthritis of knee: Secondary | ICD-10-CM | POA: Diagnosis not present

## 2019-04-05 DIAGNOSIS — Z9181 History of falling: Secondary | ICD-10-CM | POA: Diagnosis not present

## 2019-04-05 DIAGNOSIS — R079 Chest pain, unspecified: Secondary | ICD-10-CM | POA: Diagnosis not present

## 2019-04-10 ENCOUNTER — Other Ambulatory Visit: Payer: Self-pay | Admitting: Internal Medicine

## 2019-04-10 DIAGNOSIS — I10 Essential (primary) hypertension: Secondary | ICD-10-CM

## 2019-04-11 ENCOUNTER — Other Ambulatory Visit: Payer: Self-pay | Admitting: Internal Medicine

## 2019-04-11 DIAGNOSIS — Z794 Long term (current) use of insulin: Secondary | ICD-10-CM

## 2019-04-11 DIAGNOSIS — E118 Type 2 diabetes mellitus with unspecified complications: Secondary | ICD-10-CM

## 2019-04-12 ENCOUNTER — Ambulatory Visit (HOSPITAL_COMMUNITY): Payer: Medicare Other

## 2019-04-24 ENCOUNTER — Other Ambulatory Visit: Payer: Self-pay | Admitting: Internal Medicine

## 2019-04-24 DIAGNOSIS — M62838 Other muscle spasm: Secondary | ICD-10-CM

## 2019-05-05 DIAGNOSIS — R079 Chest pain, unspecified: Secondary | ICD-10-CM | POA: Diagnosis not present

## 2019-05-05 DIAGNOSIS — M170 Bilateral primary osteoarthritis of knee: Secondary | ICD-10-CM | POA: Diagnosis not present

## 2019-05-05 DIAGNOSIS — R269 Unspecified abnormalities of gait and mobility: Secondary | ICD-10-CM | POA: Diagnosis not present

## 2019-05-05 DIAGNOSIS — Z9181 History of falling: Secondary | ICD-10-CM | POA: Diagnosis not present

## 2019-05-07 DIAGNOSIS — N183 Chronic kidney disease, stage 3 unspecified: Secondary | ICD-10-CM | POA: Diagnosis not present

## 2019-05-16 DIAGNOSIS — Z23 Encounter for immunization: Secondary | ICD-10-CM | POA: Diagnosis not present

## 2019-05-16 DIAGNOSIS — N183 Chronic kidney disease, stage 3 unspecified: Secondary | ICD-10-CM | POA: Diagnosis not present

## 2019-05-16 DIAGNOSIS — I129 Hypertensive chronic kidney disease with stage 1 through stage 4 chronic kidney disease, or unspecified chronic kidney disease: Secondary | ICD-10-CM | POA: Diagnosis not present

## 2019-05-16 DIAGNOSIS — Z1159 Encounter for screening for other viral diseases: Secondary | ICD-10-CM | POA: Diagnosis not present

## 2019-05-16 DIAGNOSIS — E1122 Type 2 diabetes mellitus with diabetic chronic kidney disease: Secondary | ICD-10-CM | POA: Diagnosis not present

## 2019-05-28 ENCOUNTER — Other Ambulatory Visit: Payer: Self-pay | Admitting: Family Medicine

## 2019-05-28 DIAGNOSIS — N950 Postmenopausal bleeding: Secondary | ICD-10-CM

## 2019-06-05 ENCOUNTER — Other Ambulatory Visit: Payer: Self-pay | Admitting: Family Medicine

## 2019-06-05 DIAGNOSIS — R269 Unspecified abnormalities of gait and mobility: Secondary | ICD-10-CM | POA: Diagnosis not present

## 2019-06-05 DIAGNOSIS — M170 Bilateral primary osteoarthritis of knee: Secondary | ICD-10-CM | POA: Diagnosis not present

## 2019-06-05 DIAGNOSIS — N950 Postmenopausal bleeding: Secondary | ICD-10-CM

## 2019-06-05 DIAGNOSIS — Z9181 History of falling: Secondary | ICD-10-CM | POA: Diagnosis not present

## 2019-06-05 DIAGNOSIS — R079 Chest pain, unspecified: Secondary | ICD-10-CM | POA: Diagnosis not present

## 2019-06-06 ENCOUNTER — Telehealth: Payer: Self-pay | Admitting: Emergency Medicine

## 2019-06-06 NOTE — Telephone Encounter (Signed)
Pt called and left a message on the nurse voicemail line stating that she needs her megace prescription filled because she only has one pill left.   Pt call returned and pt was informed that Dr. Kennon Rounds refilled her prescription and it should be at her pharmacy. Pt also reminded of ultrasound appointment scheduled for 11/30 and informed that the front office would be calling her to schedule an appointment after the ultrasound has been completed. Pt verbalized understanding and had no further questions.

## 2019-06-07 ENCOUNTER — Other Ambulatory Visit: Payer: Self-pay | Admitting: Cardiovascular Disease

## 2019-06-09 ENCOUNTER — Other Ambulatory Visit: Payer: Self-pay | Admitting: Internal Medicine

## 2019-06-25 ENCOUNTER — Other Ambulatory Visit: Payer: Self-pay | Admitting: Internal Medicine

## 2019-07-02 ENCOUNTER — Ambulatory Visit (HOSPITAL_COMMUNITY): Admission: RE | Admit: 2019-07-02 | Payer: Medicare Other | Source: Ambulatory Visit

## 2019-07-12 ENCOUNTER — Ambulatory Visit: Payer: Medicare Other | Admitting: Family Medicine

## 2019-07-17 ENCOUNTER — Ambulatory Visit (HOSPITAL_COMMUNITY): Payer: Medicare Other

## 2019-07-25 ENCOUNTER — Ambulatory Visit: Payer: Medicare Other | Admitting: Family Medicine

## 2019-07-30 ENCOUNTER — Encounter (HOSPITAL_COMMUNITY): Payer: Self-pay

## 2019-07-30 ENCOUNTER — Ambulatory Visit (HOSPITAL_COMMUNITY)
Admission: RE | Admit: 2019-07-30 | Discharge: 2019-07-30 | Disposition: A | Discharge status: Home / Self Care | Payer: Medicare Other | Source: Ambulatory Visit | Attending: Family Medicine | Admitting: Family Medicine

## 2019-07-30 ENCOUNTER — Other Ambulatory Visit: Payer: Self-pay

## 2019-07-30 DIAGNOSIS — N8502 Endometrial intraepithelial neoplasia [EIN]: Secondary | ICD-10-CM

## 2019-08-22 ENCOUNTER — Other Ambulatory Visit: Payer: Self-pay | Admitting: Cardiovascular Disease

## 2019-08-23 ENCOUNTER — Ambulatory Visit (INDEPENDENT_AMBULATORY_CARE_PROVIDER_SITE_OTHER): Payer: Medicare Other | Admitting: Family Medicine

## 2019-08-23 ENCOUNTER — Other Ambulatory Visit: Payer: Self-pay

## 2019-08-23 ENCOUNTER — Encounter: Payer: Self-pay | Admitting: Family Medicine

## 2019-08-23 DIAGNOSIS — N8502 Endometrial intraepithelial neoplasia [EIN]: Secondary | ICD-10-CM | POA: Diagnosis not present

## 2019-08-23 DIAGNOSIS — N950 Postmenopausal bleeding: Secondary | ICD-10-CM

## 2019-08-23 DIAGNOSIS — Z1231 Encounter for screening mammogram for malignant neoplasm of breast: Secondary | ICD-10-CM

## 2019-08-23 MED ORDER — MEGESTROL ACETATE 40 MG PO TABS: 40.0000 mg | ORAL_TABLET | Freq: Two times a day (BID) | ORAL | 3 refills | Status: DC

## 2019-08-23 NOTE — Progress Notes (Signed)
I connected with  [name] on 08/23/19 at  8:55 AM EST by telephone and verified that I am speaking with the correct person using two identifiers.   I discussed the limitations, risks, security and privacy concerns of performing an evaluation and management service by telephone and the availability of in person appointments. I also discussed with the patient that there may be a patient responsible charge related to this service. The patient expressed understanding and agreed to proceed.  [place], [place] 08/23/2019  9:07 AM   Really bad cramping Pamprin for the pain states its helping her States she has ran out of the Megace but that helped the cramping and bleeding.

## 2019-08-23 NOTE — Progress Notes (Signed)
Marland Kitchen   TELEHEALTH VIRTUAL GYNECOLOGY VISIT ENCOUNTER NOTE  I connected with [name] on 08/23/19 at  8:55 AM EST by telephone at home and verified that I am speaking with the correct person using two identifiers.   I discussed the limitations, risks, security and privacy concerns of performing an evaluation and management service by telephone and the availability of in person appointments. I also discussed with the patient that there may be a patient responsible charge related to this service. The patient expressed understanding and agreed to proceed.   History:  [name] is a 64 y.o. G0P0000 female being evaluated today for postmenopausal bleeding. Has h/o atypical hyperplasia with atypia with IUD in place. She had it in on CT in June. She is not sure if it is still in. Would like Megace to help with the bleeding.Was having no bleeding and now with clotting and bleeding since 12/11.   Past Medical History:  Diagnosis Date  . Blind right eye   . Chronic back pain    "my whole back" (03/18/2015)  . Chronic chest pain    takes isosorbide  . Chronic diastolic heart failure ([place]) 9/13   echo 03/20/15 LV Ef of 60-65%, grade 2DD and PA peak pressure: 36 mm Hg   . CKD (chronic kidney disease), stage III ([place])   . Coronary artery disease cardiologist-- dr croitoru   per cardiac cath 02-07-2009 -- diffuse 3V CAD involving RCA, CFx, and D1,  pt not a candidate for bypass surgery or angioplasty,  medical management  . Diabetic neuropathy ([place])   . Diabetic retinopathy ([place])    bilateral proliferative  . Diastolic CHF, chronic ([place])   . Dyspnea    "always"  . Edema of both lower extremities   . Generalized weakness   . GERD (gastroesophageal reflux disease)   . Glaucoma, both eyes   . History of non-ST elevation myocardial infarction (NSTEMI)    02-25-2009;  05-15-2009;  01-08-2010;  04-04-2012 this MI was post-op surgery for abscess on 04-03-2012  . History of sepsis   .  Hyperlipemia   . Hypertension   . Insulin dependent type 2 diabetes mellitus ([place])    followed by pcp  . Iron deficiency anemia due to chronic blood loss    uterine bleeding  . Legally blind in left eye, as defined in [place]    per pt states vision "is foggy"  . Mild obstructive sleep apnea    study in epic 04-19-2012 mild osa,  recommendation's were wt. loss, dental  appliance, surgery, or cpap  . Morbid obesity with BMI of 40.0-44.9, adult ([place])   . OA (osteoarthritis)    knees  . PSVT (paroxysmal supraventricular tachycardia) ([place])   . Renal insufficiency    pt. denies  . Seasonal allergies   . Wears glasses    Past Surgical History:  Procedure Laterality Date  . BIOPSY  02/13/2018   Procedure: BIOPSY;  Surgeon: Ladene Artist, MD;  [facility];  Service: Endoscopy;;  . BREAST SURGERY Right 2019    breast biopsy,  per pt benign  . CARDIAC CATHETERIZATION  02-27-2009   dr al little   diffuse 3V CAD , involving RCA, CFx, and D1;  inferior wall abnormalities, low normal ef 50%  . CATARACT EXTRACTION Right   . CATARACT EXTRACTION W/ INTRAOCULAR LENS IMPLANT Left 10-11-2007   @[facility]   . COLONOSCOPY WITH PROPOFOL N/A 02/13/2018   Procedure: COLONOSCOPY WITH PROPOFOL;  Surgeon: Ladene Artist,  MD;  [facility];  Service: Endoscopy;  Laterality: N/A;  . DILATION AND CURETTAGE OF UTERUS  x2 last one 2000  . EYE SURGERY Bilateral 2009 to 2010   x2  left eye;  x2  right eye   . HYSTEROSCOPY WITH D & C N/A 10/11/2018   Procedure: DILATATION AND CURETTAGE /HYSTEROSCOPY;  Surgeon: [name], MD;  [facility];  Service: Gynecology;  Laterality: N/A;  . INCISION AND DRAINAGE PERIRECTAL ABSCESS  04/03/2012  . INTRAUTERINE DEVICE (IUD) INSERTION  10/11/2018   Procedure: INTRAUTERINE DEVICE (IUD) INSERTION;  Surgeon: [name], MD;  [facility];  Service: Gynecology;;  . IR US GUIDE [place] LEFT  02/13/2018  . IR VENIPUNCTURE 32YRS/OLDER BY MD  02/13/2018  .  POLYPECTOMY  02/13/2018   Procedure: POLYPECTOMY;  Surgeon: Ladene Artist, MD;  [facility];  Service: Endoscopy;;   The following portions of the patient's history were reviewed and updated as appropriate: allergies, current medications, past family history, past medical history, past social history, past surgical history and problem list.   Health Maintenance:  Normal pap and negative HRHPV on 07/2018.  Normal mammogram on 12/12/17.   Review of Systems:  Pertinent items noted in HPI and remainder of comprehensive ROS otherwise negative.  Physical Exam:   General:  Alert, oriented and cooperative.   Mental Status: Normal mood and affect perceived. Normal judgment and thought content.  Physical exam deferred due to nature of the encounter    Assessment and Plan:   Problem List Items Addressed This Visit      Unprioritized   Complex endometrial hyperplasia with atypia - Primary    Needs u/s to be scheduled--office to do--she needs Covid testing first--I have given her the number--Resume Megace and if IUD is out, may need re-sampling--difficult to do in office--would need outpt. Hysteroscopy.      Relevant Medications   megestrol (MEGACE) 40 MG tablet    Other Visit Diagnoses    Post-menopausal bleeding       Relevant Medications   megestrol (MEGACE) 40 MG tablet      I discussed the assessment and treatment plan with the patient. The patient was provided an opportunity to ask questions and all were answered. The patient agreed with the plan and demonstrated an understanding of the instructions.   The patient was advised to call back or seek an in-person evaluation/go to the ED if the symptoms worsen or if the condition fails to improve as anticipated.  I provided 12 minutes of non-face-to-face time during this encounter.   [name], MD [facility] [employer], [place]

## 2019-08-23 NOTE — Assessment & Plan Note (Signed)
Needs u/s to be scheduled--office to do--she needs Covid testing first--I have given her the number--Resume Megace and if IUD is out, may need re-sampling--difficult to do in office--would need outpt. Hysteroscopy.

## 2019-08-27 ENCOUNTER — Telehealth: Payer: Self-pay

## 2019-08-27 DIAGNOSIS — N950 Postmenopausal bleeding: Secondary | ICD-10-CM

## 2019-08-27 MED ORDER — MEGESTROL ACETATE 40 MG PO TABS: 40.0000 mg | ORAL_TABLET | Freq: Two times a day (BID) | ORAL | 11 refills | Status: DC

## 2019-08-27 NOTE — Telephone Encounter (Signed)
Call returned to pt and she stated that her insurance will charge $96 for a 3 month supply of Megace as ordered by Dr. Kennon Rounds. If she gets only 1 month supply at a time with refills, her cost is $0. Rx changed and e-prescribed to her pharmacy. Pt voiced understanding and expressed gratitude.

## 2019-08-27 NOTE — Telephone Encounter (Signed)
Pt called and stated that she was prescribed a medication that was going to cost her $96 and she can not afford it can she have something else.

## 2019-08-31 ENCOUNTER — Other Ambulatory Visit: Payer: Self-pay | Admitting: Internal Medicine

## 2019-08-31 DIAGNOSIS — E118 Type 2 diabetes mellitus with unspecified complications: Secondary | ICD-10-CM

## 2019-09-07 ENCOUNTER — Ambulatory Visit: Payer: Medicare Other | Attending: Internal Medicine | Admitting: Internal Medicine

## 2019-09-07 ENCOUNTER — Other Ambulatory Visit: Payer: Self-pay

## 2019-09-07 ENCOUNTER — Telehealth: Payer: Self-pay | Admitting: Internal Medicine

## 2019-09-07 DIAGNOSIS — R3981 Functional urinary incontinence: Secondary | ICD-10-CM

## 2019-09-07 DIAGNOSIS — I5032 Chronic diastolic (congestive) heart failure: Secondary | ICD-10-CM

## 2019-09-07 DIAGNOSIS — E1159 Type 2 diabetes mellitus with other circulatory complications: Secondary | ICD-10-CM

## 2019-09-07 DIAGNOSIS — N950 Postmenopausal bleeding: Secondary | ICD-10-CM

## 2019-09-07 DIAGNOSIS — E113593 Type 2 diabetes mellitus with proliferative diabetic retinopathy without macular edema, bilateral: Secondary | ICD-10-CM | POA: Diagnosis not present

## 2019-09-07 DIAGNOSIS — N3941 Urge incontinence: Secondary | ICD-10-CM

## 2019-09-07 DIAGNOSIS — Z794 Long term (current) use of insulin: Secondary | ICD-10-CM

## 2019-09-07 DIAGNOSIS — I25118 Atherosclerotic heart disease of native coronary artery with other forms of angina pectoris: Secondary | ICD-10-CM

## 2019-09-07 DIAGNOSIS — E538 Deficiency of other specified B group vitamins: Secondary | ICD-10-CM

## 2019-09-07 MED ORDER — ACCU-CHEK SOFTCLIX LANCETS MISC: 5 refills | Status: DC

## 2019-09-07 NOTE — Telephone Encounter (Signed)
Patient was called, no answer. Patient was left with a detailed vm to schedule a 2-4 week fu with pcp for headaches and bp check.

## 2019-09-07 NOTE — Progress Notes (Signed)
Virtual Visit via Telephone Note Due to current restrictions/limitations of in-office visits due to the COVID-19 pandemic, this scheduled clinical appointment was converted to a telehealth visit  I connected with [name] on 09/07/19 at 11:36 a.m by telephone and verified that I am speaking with the correct person using two identifiers. I am in my office.  The patient is at home.  Only the patient and myself participated in this encounter.  I discussed the limitations, risks, security and privacy concerns of performing an evaluation and management service by telephone and the availability of in person appointments. I also discussed with the patient that there may be a patient responsible charge related to this service. The patient expressed understanding and agreed to proceed.  History of Present Illness: Pt with hx of DM with probable gastroparesis,retinopathy,HTN, HL, CAD, chronic diastolic CHF, obesity,IDA, OA knees, Vit B 12 def, CKD 3 (followed by [facility]). Last evaluated 01/2019  Postmenopausal Bleeding:  Started having bleeding again in Nov 2020.  Gets backache, lower abdominal cramps and headaches with the bleeding.  She is followed by gynecologist Dr. Kennon Rounds.  Patient has IUD in place and is also taking Megace. Taking [place] for the cramps Of note had CT abd/pelvis 01/2019 revealed 4.3 cm right ovarian cystic lesion.  Her gynecologist recently ordered pelvic ultrasound but patient has not had that done as yet  CKD 3:  Followed by Dr. Thurmond Butts at [facility].  Last seen in Nov 2020.  States she was told kidney function stable.  He was control about her BP because it was high.  No device to check BP  CAD/CHF/HTN: Currently taking: see medication list including the potassium Med Adherence: _0  Yes    _1  No Medication side effects: _2  Yes    _3  No Adherence with salt restriction: _4  Yes    _5  No Home Monitoring?: _6  Yes    _7  No, no device Monitoring Frequency: _8  Yes     _9  No Home BP results range: _10  Yes    _11  No SOB? _12  Yes    _13  No Chest Pain?: _14  Yes - had CP episode last wk that resolved with 1 SL Nitro.  Prior to last wk, she use SL Nitro around 05/2019.  Leg swelling?: _15  Yes    _16  No Headaches?: _17  Yes almost daily  Dizziness? _18  Yes    _19  No Comments:  No PND or orthopnea  DM:  Out of lancets since end of Dec 2020 so has not been able to check BS.  Needs RF on lancets Reports compliance with Humalog 75/25 50 units BID Feels she does okay with eating habits at this time.  But admits that she over ate during thanksgiving and christmas holidays.  Thinks she gained about 20 lbs -over due for eye exam.  Has cataract and feels vision is getting worse.  Vit B12 def:  Taking OTC but does not recall dose Fell over her foot stool 06/2019.  No injury.  Walks with cane even in house  Has urinary incontinence.  When she gets the urge to go she has to go right away otherwise she has incontinence of urine.  Part of the issue is that she is unable to get to the restroom in time.  She is on fluid pills.  As to where Depends.  Requests prescription for depends    Outpatient Encounter Medications as of 09/07/2019  Medication Sig Note  . aspirin EC 81 MG tablet Take 81 mg by mouth  daily.   . atorvastatin (LIPITOR) 80 MG tablet TAKE 1 TABLET BY MOUTH ONCE DAILY IN THE EVENING AT 6PM   . brimonidine-timolol (COMBIGAN) 0.2-0.5 % ophthalmic solution Place 1 drop into both eyes 2 (two) times daily.    . Cyanocobalamin (VITAMIN B-12) 1000 MCG TABS Take 1,000 mcg by mouth daily.   Marland Kitchen diltiazem (DILACOR XR) 240 MG 24 hr capsule Take 1 capsule (240 mg total) by mouth daily.   . ferrous sulfate 325 (65 FE) MG tablet Take 325 mg by mouth daily with breakfast.   . furosemide (LASIX) 40 MG tablet TAKE 1 TABLET BY MOUTH TWO  TIMES DAILY (TAKE WITH  FUROSEMIDE 80 MG TAB)   . furosemide (LASIX) 80 MG tablet TAKE 1 TABLET BY MOUTH TWO  TIMES DAILY WITH FUROSEMIDE 40MG  (TOTAL  DOSE OF 120MG  TWO TIMES DAILY )   . insulin lispro protamine-lispro (HUMALOG MIX 75/25) (75-25) 100 UNIT/ML SUSP injection INJECT SUBCUTANEOUSLY 50  UNITS TWICE DAILY WITH  MEALS. Please make PCP appointment.   . isosorbide mononitrate (IMDUR) 120 MG 24 hr tablet TAKE 1 TABLET BY MOUTH ONCE DAILY   . losartan (COZAAR) 50 MG tablet Take 2 tablets by mouth once daily   . LUMIGAN 0.01 % SOLN Place 1 drop into the right eye at bedtime.  03/18/2015: .  . megestrol (MEGACE) 40 MG tablet Take 1 tablet (40 mg total) by mouth 2 (two) times daily.   . metoCLOPramide (REGLAN) 5 MG tablet TAKE 1 TABLET BY MOUTH TWICE DAILY BEFORE THE TWO LARGEST MEALS OF THE DAY   . metoprolol (TOPROL-XL) 200 MG 24 hr tablet Take 1 tablet by mouth once daily   . nitroGLYCERIN (NITROSTAT) 0.4 MG SL tablet PLACE 1 TABLET UNDER THE TONGUE EVERY 5 MINUTES  AS NEEDED FOR CHEST PAIN. DO NOT EXCEED A TOTAL OF 3 DOSES IN 15 MINUTES NOW   . pantoprazole (PROTONIX) 40 MG tablet TAKE ONE TABLET BY MOUTH ONCE DAILY (Patient taking differently: Take 40 mg by mouth daily. )   . ACCU-CHEK SOFTCLIX LANCETS lancets Use as instructed for 3 times daily blood glucose monitoring   . acetaminophen (TYLENOL) 500 MG tablet Take 1 tablet (500 mg total) by mouth every 6 (six) hours as needed. (Patient taking differently: Take 500 mg by mouth every 6 (six) hours as needed (pain.). )   . Acetaminophen-Caff-Pyrilamine (MIDOL COMPLETE PO) Take 1 tablet by mouth every 6 (six) hours as needed (cramps).   Marland Kitchen acetaZOLAMIDE (DIAMOX) 500 MG capsule Take 500 mg by mouth 2 (two) times daily.   . baclofen (LIORESAL) 10 MG tablet Take 1 tablet (10 mg total) by mouth at bedtime. (Patient not taking: Reported on 09/07/2019)   . bismuth subsalicylate (PEPTO BISMOL) 262 MG/15ML suspension Take 30 mLs by mouth every 6 (six) hours as needed for indigestion.   . Blood Glucose Monitoring Suppl (ACCU-CHEK AVIVA PLUS) w/Device KIT Used as directed   . Blood Pressure Monitoring  (BLOOD PRESSURE MONITOR/ARM) DEVI 1 each by Does not apply route daily. ICD 10, I10 and I50.32 (Patient not taking: Reported on 08/23/2019)   . calcium carbonate (TUMS - DOSED IN MG ELEMENTAL CALCIUM) 500 MG chewable tablet Chew 1 tablet by mouth 3 (three) times daily as needed for indigestion.    . cephALEXin (KEFLEX) 500 MG capsule Take 1 capsule (500 mg total) by mouth 3 (three) times daily. (Patient not taking: Reported on 09/07/2019)   . glucose blood test strip Use TID before meals /  Dx E11.9   . glucose monitoring kit (FREESTYLE) monitoring kit 1 each by Does not apply route 4 (four) times daily - after meals and at bedtime.   Marland Kitchen HYDROcodone-acetaminophen (NORCO/VICODIN) 5-325 MG tablet Take 2 tablets by mouth every 4 (four) hours as needed. (Patient not taking: Reported on 09/07/2019)   . levonorgestrel (MIRENA) 20 MCG/24HR IUD 1 Intra Uterine Device (1 each total) by Intrauterine route once for 1 dose.   . Menthol-Methyl Salicylate (MUSCLE RUB EX) Apply 1 application topically daily as needed (knee pain).    Marland Kitchen metolazone (ZAROXOLYN) 2.5 MG tablet 1 tab once every two weeks PRN swelling lower extremities (Patient not taking: Reported on 08/23/2019)   . potassium chloride SA (K-DUR) 20 MEQ tablet TAKE 2 TABLETS BY MOUTH IN  THE MORNING AND 1 TABLET IN THE EVENING (Patient not taking: Reported on 08/23/2019)   . RELION INSULIN SYRINGE 31G X 15/64" 1 ML MISC USE AS DIRECTED TWICE DAILY INSULIN ADMINISTRATION    No facility-administered encounter medications on file as of 09/07/2019.    Observations/Objective:   Chemistry      Component Value Date/Time   NA 137 01/15/2019 1745   NA 146 (H) 09/29/2018 1153   K 4.1 01/15/2019 1745   CL 108 01/15/2019 1745   CO2 19 (L) 01/15/2019 1745   BUN 12 01/15/2019 1745   BUN 12 09/29/2018 1153   CREATININE 1.44 (H) 01/15/2019 1745   CREATININE 1.30 (H) 09/28/2016 1004      Component Value Date/Time   CALCIUM 8.5 (L) 01/15/2019 1745   ALKPHOS 68  01/15/2019 1745   AST 14 (L) 01/15/2019 1745   ALT 14 01/15/2019 1745   BILITOT 0.6 01/15/2019 1745   BILITOT 0.4 12/20/2017 0949     Lab Results  Component Value Date   WBC 19.6 (H) 01/15/2019   HGB 12.3 01/15/2019   HCT 38.5 01/15/2019   MCV 92.8 01/15/2019   PLT 276 01/15/2019     Assessment and Plan: 1. Type 2 diabetes mellitus with other circulatory complication, with long-term current use of insulin (HCC) -Refills sent on lancets so that she can check her blood sugars. Dietary counseling given.  She is agreeable to seeing a nutritionist. She will continue current dose of Humalog 75/25 - Hemoglobin A1c; Future - Accu-Chek Softclix Lancets lancets; Use as instructed for 3 times daily blood glucose monitoring  Dispense: 100 each; Refill: 5 - CBC; Future - Comprehensive metabolic panel; Future - Lipid panel; Future  2. Proliferative diabetic retinopathy of both eyes associated with type 2 diabetes mellitus, unspecified proliferative retinopathy type ([place]) Overdue for eye exam.  She is agreeable to referral - Ambulatory referral to Ophthalmology  3. Severe obesity (BMI >= 40) (HCC) See #1 above - Amb ref to Medical Nutrition Therapy-MNT  4. Chronic diastolic congestive heart failure (HCC) Stable.  Continue current medications including furosemide, metoprolol, Cozaar  5. Coronary artery disease of native artery of native heart with stable angina pectoris ([place]) She has not had any increase in anginal episodes. Continue metoprolol, isosorbide, atorvastatin  6. Functional urinary incontinence 7. Urge incontinence of urine We will have my CMA sending an order for Depends for her  8. Postmenopausal bleeding Being followed by gynecology I have sent a message to Dr. Kennon Rounds informing her of the 4.3 cm cystic lesions seen on the RT ovary on CAT scan done last year so this can be evaluated  9. Vitamin B12 deficiency - Vitamin B12; Future   Follow Up Instructions:  2-3 wks  for BP check and HA   I discussed the assessment and treatment plan with the patient. The patient was provided an opportunity to ask questions and all were answered. The patient agreed with the plan and demonstrated an understanding of the instructions.   The patient was advised to call back or seek an in-person evaluation if the symptoms worsen or if the condition fails to improve as anticipated.  I provided 26 minutes of non-face-to-face time during this encounter.   Karle Plumber, MD

## 2019-09-10 ENCOUNTER — Other Ambulatory Visit: Payer: Self-pay

## 2019-09-10 ENCOUNTER — Telehealth (INDEPENDENT_AMBULATORY_CARE_PROVIDER_SITE_OTHER): Payer: Medicare Other | Admitting: Family Medicine

## 2019-09-10 ENCOUNTER — Encounter: Payer: Self-pay | Admitting: Family Medicine

## 2019-09-10 DIAGNOSIS — N8502 Endometrial intraepithelial neoplasia [EIN]: Secondary | ICD-10-CM

## 2019-09-10 NOTE — Progress Notes (Signed)
I connected with  [name] on 09/10/19 at  4:15 PM EST by telephone and verified that I am speaking with the correct person using two identifiers.   I discussed the limitations, risks, security and privacy concerns of performing an evaluation and management service by telephone and the availability of in person appointments. I also discussed with the patient that there may be a patient responsible charge related to this service. The patient expressed understanding and agreed to proceed.  Pt informed about her appt scheduled for COVID screening at [place] on 09/17/19 at 1000.  I also informed pt that her [name] appt has been scheduled for 09/21/19 @ 0900.  Pt given info to both appts and encouraged that if she has any questions to please give the office a call back.  Pt verbalized understanding.   Verdell Carmine, RN 09/10/2019  3:32 PM

## 2019-09-10 NOTE — Progress Notes (Signed)
TELEHEALTH VIRTUAL GYNECOLOGY VISIT ENCOUNTER NOTE  I connected with [name] on 09/10/19 at  4:15 PM EST by telephone at home and verified that I am speaking with the correct person using two identifiers.   I discussed the limitations, risks, security and privacy concerns of performing an evaluation and management service by telephone and the availability of in person appointments. I also discussed with the patient that there may be a patient responsible charge related to this service. The patient expressed understanding and agreed to proceed.   History:  [name] is a 64 y.o. G0P0000 female being evaluated today for postmenopausal bleeding. On megace and no further bleeding.Has ovarian cyst and IUD in place for PMB with complex hyperplasia with atypia and last EMB negative, but minimal tissue-- She denies any abnormal vaginal discharge, bleeding, pelvic pain or other concerns.       Past Medical History:  Diagnosis Date  . Blind right eye   . Chronic back pain    "my whole back" (03/18/2015)  . Chronic chest pain    takes isosorbide  . Chronic diastolic heart failure ([place]) 9/13   echo 03/20/15 LV Ef of 60-65%, grade 2DD and PA peak pressure: 36 mm Hg   . CKD (chronic kidney disease), stage III   . Coronary artery disease cardiologist-- dr croitoru   per cardiac cath 02-07-2009 -- diffuse 3V CAD involving RCA, CFx, and D1,  pt not a candidate for bypass surgery or angioplasty,  medical management  . Diabetic neuropathy ([place])   . Diabetic retinopathy ([place])    bilateral proliferative  . Diastolic CHF, chronic ([place])   . Dyspnea    "always"  . Edema of both lower extremities   . Generalized weakness   . GERD (gastroesophageal reflux disease)   . Glaucoma, both eyes   . History of non-ST elevation myocardial infarction (NSTEMI)    02-25-2009;  05-15-2009;  01-08-2010;  04-04-2012 this MI was post-op surgery for abscess on 04-03-2012  . History of sepsis   .  Hyperlipemia   . Hypertension   . Insulin dependent type 2 diabetes mellitus ([place])    followed by pcp  . Iron deficiency anemia due to chronic blood loss    uterine bleeding  . Legally blind in left eye, as defined in [place]    per pt states vision "is foggy"  . Mild obstructive sleep apnea    study in epic 04-19-2012 mild osa,  recommendation's were wt. loss, dental  appliance, surgery, or cpap  . Morbid obesity with BMI of 40.0-44.9, adult ([place])   . OA (osteoarthritis)    knees  . PSVT (paroxysmal supraventricular tachycardia) ([place])   . Renal insufficiency    pt. denies  . Seasonal allergies   . Wears glasses    Past Surgical History:  Procedure Laterality Date  . BIOPSY  02/13/2018   Procedure: BIOPSY;  Surgeon: Ladene Artist, MD;  [facility];  Service: Endoscopy;;  . BREAST SURGERY Right 2019    breast biopsy,  per pt benign  . CARDIAC CATHETERIZATION  02-27-2009   dr al little   diffuse 3V CAD , involving RCA, CFx, and D1;  inferior wall abnormalities, low normal ef 50%  . CATARACT EXTRACTION Right   . CATARACT EXTRACTION W/ INTRAOCULAR LENS IMPLANT Left 10-11-2007   @[facility]   . COLONOSCOPY WITH PROPOFOL N/A 02/13/2018   Procedure: COLONOSCOPY WITH PROPOFOL;  Surgeon: Ladene Artist, MD;  [facility];  Service:  Endoscopy;  Laterality: N/A;  . DILATION AND CURETTAGE OF UTERUS  x2 last one 2000  . EYE SURGERY Bilateral 2009 to 2010   x2  left eye;  x2  right eye   . HYSTEROSCOPY WITH D & C N/A 10/11/2018   Procedure: DILATATION AND CURETTAGE /HYSTEROSCOPY;  Surgeon: [name], MD;  [facility];  Service: Gynecology;  Laterality: N/A;  . INCISION AND DRAINAGE PERIRECTAL ABSCESS  04/03/2012  . INTRAUTERINE DEVICE (IUD) INSERTION  10/11/2018   Procedure: INTRAUTERINE DEVICE (IUD) INSERTION;  Surgeon: [name], MD;  [facility];  Service: Gynecology;;  . IR US GUIDE [place] LEFT  02/13/2018  . IR VENIPUNCTURE 64YRS/OLDER BY MD  02/13/2018  .  POLYPECTOMY  02/13/2018   Procedure: POLYPECTOMY;  Surgeon: Ladene Artist, MD;  [facility];  Service: Endoscopy;;   The following portions of the patient's history were reviewed and updated as appropriate: allergies, current medications, past family history, past medical history, past social history, past surgical history and problem list.     Review of Systems:  Pertinent items noted in HPI and remainder of comprehensive ROS otherwise negative.  Physical Exam:   General:  Alert, oriented and cooperative.   Mental Status: Normal mood and affect perceived. Normal judgment and thought content.  Physical exam deferred due to nature of the encounter  Labs and Imaging No results found for this or any previous visit (from the past 336 hour(s)). No results found.    Assessment and Plan:      Problem List Items Addressed This Visit      Unprioritized   Complex endometrial hyperplasia with atypia    Continue IUD and Megace--check u/s--wants COVID test prior to u/s--have been trying to schedule for some time--may need repeat OR for endometrial sampling--very difficult in office.          I discussed the assessment and treatment plan with the patient. The patient was provided an opportunity to ask questions and all were answered. The patient agreed with the plan and demonstrated an understanding of the instructions.   The patient was advised to call back or seek an in-person evaluation/go to the ED if the symptoms worsen or if the condition fails to improve as anticipated.  I provided 12 minutes of non-face-to-face time during this encounter.   [name], MD [facility] for [employer], [place]

## 2019-09-11 ENCOUNTER — Encounter: Payer: Self-pay | Admitting: Family Medicine

## 2019-09-11 NOTE — Assessment & Plan Note (Signed)
Continue IUD and Megace--check u/s--wants COVID test prior to u/s--have been trying to schedule for some time--may need repeat OR for endometrial sampling--very difficult in office.

## 2019-09-17 ENCOUNTER — Ambulatory Visit: Payer: Medicare Other | Attending: Internal Medicine

## 2019-09-17 DIAGNOSIS — Z20822 Contact with and (suspected) exposure to covid-19: Secondary | ICD-10-CM

## 2019-09-18 LAB — NOVEL CORONAVIRUS, NAA: SARS-CoV-2, NAA: NOT DETECTED

## 2019-09-21 ENCOUNTER — Ambulatory Visit (HOSPITAL_COMMUNITY): Payer: Medicare Other | Attending: Family Medicine

## 2019-09-26 ENCOUNTER — Other Ambulatory Visit: Payer: Self-pay | Admitting: Internal Medicine

## 2019-09-26 DIAGNOSIS — I2510 Atherosclerotic heart disease of native coronary artery without angina pectoris: Secondary | ICD-10-CM

## 2019-09-30 ENCOUNTER — Other Ambulatory Visit: Payer: Self-pay | Admitting: Internal Medicine

## 2019-10-03 DIAGNOSIS — H54415A Blindness right eye category 5, normal vision left eye: Secondary | ICD-10-CM | POA: Diagnosis not present

## 2019-10-29 ENCOUNTER — Other Ambulatory Visit: Payer: Self-pay | Admitting: Cardiovascular Disease

## 2019-10-29 DIAGNOSIS — E876 Hypokalemia: Secondary | ICD-10-CM

## 2019-11-16 ENCOUNTER — Other Ambulatory Visit: Payer: Self-pay | Admitting: Internal Medicine

## 2019-11-16 DIAGNOSIS — I2510 Atherosclerotic heart disease of native coronary artery without angina pectoris: Secondary | ICD-10-CM

## 2019-11-19 ENCOUNTER — Other Ambulatory Visit: Payer: Self-pay | Admitting: Cardiovascular Disease

## 2019-11-21 ENCOUNTER — Telehealth: Payer: Self-pay | Admitting: Internal Medicine

## 2019-11-21 NOTE — Telephone Encounter (Signed)
-----   Message from [name], MD sent at 09/11/2019  2:23 PM EST ----- Thanks!! I am working to get her an u/s and she will not come until she has a COVID test.  Set all this up for next week, hopefully, we will have more information once all that is done. ----- Message ----- From: Ladell Pier, MD Sent: 09/07/2019   6:13 PM EST To: [name], MD  I have the PCP for this patient.  I had a telemedicine visit with her today.  In looking through her chart I note that she had a CAT scan of the abdomen and pelvis done 01/2019 that revealed a 4.3 cm cystic lesion on the right ovary.  I just wanted to bring this to your attention for further evaluation.

## 2019-11-21 NOTE — Telephone Encounter (Signed)
Contacted pt to go over Dr. Wynetta Emery message was unable to reach pt due to call can't completed at this time will try pt again

## 2019-11-22 ENCOUNTER — Other Ambulatory Visit: Payer: Self-pay | Admitting: Cardiovascular Disease

## 2019-11-23 NOTE — Telephone Encounter (Signed)
Rx(s) sent to pharmacy electronically.  

## 2019-11-23 NOTE — Telephone Encounter (Signed)
Contacted pt to go over Dr. Wynetta Emery message and to schedule an appointment pt didn't answer and was unable to lvm due to vm not being setup

## 2019-12-24 ENCOUNTER — Encounter: Payer: Self-pay | Admitting: Internal Medicine

## 2019-12-24 ENCOUNTER — Other Ambulatory Visit: Payer: Self-pay

## 2019-12-24 ENCOUNTER — Ambulatory Visit (HOSPITAL_BASED_OUTPATIENT_CLINIC_OR_DEPARTMENT_OTHER): Payer: Medicare Other | Admitting: Pharmacist

## 2019-12-24 ENCOUNTER — Ambulatory Visit: Payer: Medicare Other | Attending: Internal Medicine | Admitting: Internal Medicine

## 2019-12-24 VITALS — BP 147/77 | HR 79 | Temp 97.5°F | Resp 16 | Wt 284.4 lb

## 2019-12-24 DIAGNOSIS — G3281 Cerebellar ataxia in diseases classified elsewhere: Secondary | ICD-10-CM | POA: Diagnosis not present

## 2019-12-24 DIAGNOSIS — E611 Iron deficiency: Secondary | ICD-10-CM

## 2019-12-24 DIAGNOSIS — N950 Postmenopausal bleeding: Secondary | ICD-10-CM

## 2019-12-24 DIAGNOSIS — Z23 Encounter for immunization: Secondary | ICD-10-CM

## 2019-12-24 DIAGNOSIS — Z1231 Encounter for screening mammogram for malignant neoplasm of breast: Secondary | ICD-10-CM

## 2019-12-24 DIAGNOSIS — I10 Essential (primary) hypertension: Secondary | ICD-10-CM

## 2019-12-24 DIAGNOSIS — Z794 Long term (current) use of insulin: Secondary | ICD-10-CM | POA: Diagnosis not present

## 2019-12-24 DIAGNOSIS — R42 Dizziness and giddiness: Secondary | ICD-10-CM | POA: Diagnosis not present

## 2019-12-24 DIAGNOSIS — E1159 Type 2 diabetes mellitus with other circulatory complications: Secondary | ICD-10-CM | POA: Diagnosis not present

## 2019-12-24 DIAGNOSIS — Z9181 History of falling: Secondary | ICD-10-CM | POA: Diagnosis not present

## 2019-12-24 DIAGNOSIS — Z1331 Encounter for screening for depression: Secondary | ICD-10-CM

## 2019-12-24 DIAGNOSIS — K219 Gastro-esophageal reflux disease without esophagitis: Secondary | ICD-10-CM

## 2019-12-24 DIAGNOSIS — Z6841 Body Mass Index (BMI) 40.0 and over, adult: Secondary | ICD-10-CM

## 2019-12-24 DIAGNOSIS — E538 Deficiency of other specified B group vitamins: Secondary | ICD-10-CM

## 2019-12-24 DIAGNOSIS — I25118 Atherosclerotic heart disease of native coronary artery with other forms of angina pectoris: Secondary | ICD-10-CM

## 2019-12-24 DIAGNOSIS — G5603 Carpal tunnel syndrome, bilateral upper limbs: Secondary | ICD-10-CM

## 2019-12-24 LAB — GLUCOSE, POCT (MANUAL RESULT ENTRY): POC Glucose: 231 mg/dl — AB (ref 70–99)

## 2019-12-24 LAB — POCT GLYCOSYLATED HEMOGLOBIN (HGB A1C): HbA1c, POC (controlled diabetic range): 10 % — AB (ref 0.0–7.0)

## 2019-12-24 MED ORDER — HUMALOG MIX 75/25 (75-25) 100 UNIT/ML ~~LOC~~ SUSP: SUBCUTANEOUS | 0 refills | Status: DC

## 2019-12-24 MED ORDER — ACCU-CHEK SOFTCLIX LANCETS MISC: 5 refills | Status: AC

## 2019-12-24 NOTE — Progress Notes (Signed)
Patient presents for vaccination against strep pneumo per orders of Dr. Johnson. Consent given. Counseling provided. No contraindications exists. Vaccine administered without incident.   

## 2019-12-24 NOTE — Progress Notes (Signed)
Patient ID: [name], female    DOB: 1955/08/27  MRN: [id number]  CC: Diabetes and Hypertension   Subjective: [name] is a 64 y.o. female who presents for chronic ds management Her concerns today include:  Pt with hx of DM with probable gastroparesis,retinopathy,HTN, HL, CAD, chronic diastolic CHF, obesity,IDA, OA knees, Vit B 12 def, CKD 3 (followed by [facility]).   Postmenopausal bleeding/RT ovarian cystic mass: She last saw Dr. Kennon Rounds in February.  She was left on Megace and has an IUD.  Pelvic ultrasound was ordered.  Patient had told her GYN that she would not get the ultrasound done until she has a Covid test first.  She did get the Covid test which was negative but did not get the pelvic ultrasound that was ordered.  She states that the weather was too bad that day to go out.  Still gets cramps but bleeding has stopped.  IDA: self-stopped iron 3 mths ago because her sister-in-law told her that it can cause constipation.  She reports noticing a little constipation when she took the iron.  GERD: Reports that her stomach always seems to be upset when she eats.  Has to take 2 Tums daily with Protonix to kept indigestion down.  She tries to avoid foods that she feels would cause indigestion.  DIABETES TYPE 2 Last A1C:   Results for orders placed or performed in visit on 12/24/19  POCT glucose (manual entry)  Result Value Ref Range   POC Glucose 231 (A) 70 - 99 mg/dl  POCT glycosylated hemoglobin (Hb A1C)  Result Value Ref Range   Hemoglobin A1C     HbA1c POC (<> result, manual entry)     HbA1c, POC (prediabetic range)     HbA1c, POC (controlled diabetic range) 10.0 (A) 0.0 - 7.0 %    Med Adherence:  _0  Yes on Humalog 75/25 insulin 50 units BID Medication side effects:  _1  Yes    _2  No Home Monitoring?  _3  Yes but has not done so in 1 mth.  Out of lancets.  Requests refill. Home glucose results range: Diet Adherence: _4  Yes    _5  No - had fried chicken  today for BF. Drinks a lot of juice.  According to our scale she gained 12 lbs since August of last year. Exercise: _6  Yes    _7  No - "I lose my balance a lot and dizzy a lot. I have to always keep my cane with me even in the house." Symptoms x  Yr.  Fell in 09/2019 and 11/01/2019.  Has to go slow with position changes like getting up from a chair or bending over. Feels lightheaded when she does these maneuvers.  She drinks adequate fluids during the day but states that her mouth always feels dry.   Hypoglycemic episodes?: _8  Yes    _9  No Numbness of the feet? _10  Yes    _11  No.  Positive numbness in hands x 5 mths.  Wakes up at nights with numbness in the hands Retinopathy hx? _12  Yes    _13  No Last eye exam: Had eye exam by Dr. Venetia Maxon at [facility].  On eye drops for glaucoma BL   HYPERTENSION/CAD/CHF Currently taking: see medication list.  Did not take meds as yet for today Med Adherence: _14  Yes    _15  No Medication side effects: _16  Yes    _17  No Adherence with salt restriction: _18  Yes    _19  No Home Monitoring?: _20   Yes    _0  No Monitoring Frequency: _1  Yes    _2  No Home BP results range: _3  Yes    _4  No SOB? _5  Yes    _6  No Chest Pain?: _7  Yes with exertion sometimes -twice this mth she used SL NL Leg swelling?: _8  Yes in LT leg.  She states that her husband pointed this out to her.  She denies any pain in the legs.  Headaches?: _9  Yes when she gets pain in RT eye.  Legally blind in the right eye Dizziness? _10  Yes    _11  No Comments:  No PND or orthorpea.   C/o cough productive of clear phlegm since 06/2019.  No fever.   HM:  Received Pfizer vaccine series.  Due for Prevnar vaccine.  Patient Active Problem List   Diagnosis Date Noted  . Functional urinary incontinence 09/07/2019  . Urge incontinence of urine 09/07/2019  . Legally blind in right eye, as defined in [place] 08/11/2018  . Bilateral carpal tunnel syndrome 04/13/2018  . Adenomatous polyp of colon 04/13/2018  .  Fibrocystic breast changes, right 12/12/2017  . Drug-induced constipation 09/15/2017  . Primary osteoarthritis of both knees 04/05/2017  . Chronic bilateral low back pain 03/07/2017  . Chronic pain of right knee 03/07/2017  . Vitamin B12 deficiency 12/09/2016  . Incidental lung nodule, > 3m and < 81m02/13/2018  . Vitreous hemorrhage of right eye ([place] [place]  . Diabetic gastroparesis associated with type 2 diabetes mellitus (HC[place]ruce[place]02/05/2016  . Controlled type 2 diabetes mellitus with complication, with long-term current use of insulin (HCOlds02/05/2016  . Chronic renal insufficiency, stage III (moderate) 03/19/2015  . Prolonged Q-T interval on ECG 08/10/2013  . Dyslipidemia 04/02/2013  . Chronic diastolic heart failure, NYHA class 3 (HCAstoria09/01/2012  . Presumed OSA (obstructive sleep apnea) 04/06/2012  . NSTEMI  post-op 04/04/12-medical Rx 04/04/2012  . Severe obesity (BMI >= 40) (HCSuperior06/17/2013  . GERD 08/21/2009  . Complex endometrial hyperplasia with atypia 03/20/2009  . PERIPHERAL EDEMA 03/20/2009  . Coronary atherosclerosis-not a surgical or PCI candidate 2010 02/27/2009  . Proliferative diabetic retinopathy (HCBall Club03/01/2008  . DETACHED RETINA, BILATERAL, HX OF 09/07/2007  . History of chronic Iron defeicency anemia with acute post op anemia, transfused after debridment 04/04/12 04/17/2007  . Essential hypertension 04/17/2007     Current Outpatient Medications on File Prior to Visit  Medication Sig Dispense Refill  . acetaminophen (TYLENOL) 500 MG tablet Take 1 tablet (500 mg total) by mouth every 6 (six) hours as needed. (Patient taking differently: Take 500 mg by mouth every 6 (six) hours as needed (pain.). ) 30 tablet 0  . acetaZOLAMIDE (DIAMOX) 500 MG capsule Take 500 mg by mouth 2 (two) times daily.    . Marland Kitchenspirin EC 81 MG tablet Take 81 mg by mouth daily.    . Marland Kitchentorvastatin (LIPITOR) 80 MG tablet TAKE 1 TABLET BY MOUTH ONCE DAILY IN THE EVENING AT 6PM 30 tablet 11  . bismuth  subsalicylate (PEPTO BISMOL) 262 MG/15ML suspension Take 30 mLs by mouth every 6 (six) hours as needed for indigestion.    . Blood Glucose Monitoring Suppl (ACCU-CHEK AVIVA PLUS) w/Device KIT Used as directed 1 kit 0  . Blood Pressure Monitoring (BLOOD PRESSURE MONITOR/ARM) DEVI 1 each by Does not apply route daily. ICD 10, I10 and I50.32 (Patient not taking: Reported on 08/23/2019) 1 Device 0  . brimonidine-timolol (COMBIGAN) 0.2-0.5 % ophthalmic solution Place 1 drop into both eyes 2 (two) times daily.     .Marland Kitchen  calcium carbonate (TUMS - DOSED IN MG ELEMENTAL CALCIUM) 500 MG chewable tablet Chew 1 tablet by mouth 3 (three) times daily as needed for indigestion.     . Cyanocobalamin (VITAMIN B-12) 1000 MCG TABS Take 1,000 mcg by mouth daily. 90 tablet 1  . diltiazem (DILACOR XR) 240 MG 24 hr capsule Take 1 capsule (240 mg total) by mouth daily. 90 capsule 3  . ferrous sulfate 325 (65 FE) MG tablet Take 325 mg by mouth daily with breakfast.    . furosemide (LASIX) 40 MG tablet TAKE 1 TABLET BY MOUTH TWO  TIMES DAILY (TAKE WITH  FUROSEMIDE 80 MG TAB) 180 tablet 3  . furosemide (LASIX) 80 MG tablet TAKE 1 TABLET BY MOUTH TWO  TIMES DAILY WITH FUROSEMIDE 40MG  (TOTAL DOSE OF 120MG  TWO TIMES DAILY ) 180 tablet 0  . glucose blood test strip Use TID before meals / Dx E11.9 100 each 12  . glucose monitoring kit (FREESTYLE) monitoring kit 1 each by Does not apply route 4 (four) times daily - after meals and at bedtime. 1 each 1  . isosorbide mononitrate (IMDUR) 120 MG 24 hr tablet TAKE 1 TABLET BY MOUTH ONCE DAILY 90 tablet 3  . losartan (COZAAR) 50 MG tablet Take 2 tablets by mouth once daily 180 tablet 0  . LUMIGAN 0.01 % SOLN Place 1 drop into the right eye at bedtime.     . megestrol (MEGACE) 40 MG tablet Take 1 tablet (40 mg total) by mouth 2 (two) times daily. (Patient not taking: Reported on 09/10/2019) 60 tablet 11  . Menthol-Methyl Salicylate (MUSCLE RUB EX) Apply 1 application topically daily as needed  (knee pain).     Marland Kitchen metoCLOPramide (REGLAN) 5 MG tablet TAKE 1 TABLET BY MOUTH TWICE DAILY BEFORE THE TWO LARGEST MEALS OF THE DAY 60 tablet 2  . metoprolol (TOPROL-XL) 200 MG 24 hr tablet Take 1 tablet by mouth once daily 90 tablet 0  . nitroGLYCERIN (NITROSTAT) 0.4 MG SL tablet Place 1 tablet (0.4 mg total) under the tongue every 5 (five) minutes as needed for chest pain. Max 3 doses in 15 minutes. <PLEASE MAKE APPOINTMENT FOR REFILLS> 25 tablet 1  . pantoprazole (PROTONIX) 40 MG tablet TAKE ONE TABLET BY MOUTH ONCE DAILY (Patient taking differently: Take 40 mg by mouth daily. ) 30 tablet 2  . potassium chloride SA (KLOR-CON) 20 MEQ tablet TAKE 2 TABLETS BY MOUTH IN  THE MORNING AND 1 TABLET IN THE EVENING 270 tablet 3  . RELION INSULIN SYRINGE 31G X 15/64" 1 ML MISC USE AS DIRECTED TWICE DAILY INSULIN ADMINISTRATION 100 each 2   No current facility-administered medications on file prior to visit.    Allergies  Allergen Reactions  . Ativan [Lorazepam] Anaphylaxis  . Orange Fruit [Citrus] Anaphylaxis and Other (See Comments)    Pt stated throat swelling, itching    Social History   Socioeconomic History  . Marital status: Married    Spouse name: Not on file  . Number of children: 0  . Years of education: Not on file  . Highest education level: Not on file  Occupational History  . Occupation: disabled  Tobacco Use  . Smoking status: Never Smoker  . Smokeless tobacco: Never Used  Substance and Sexual Activity  . Alcohol use: No  . Drug use: Never  . Sexual activity: Yes  Other Topics Concern  . Not on file  Social History Narrative  . Not on file   Social Determinants  of Health   Financial Resource Strain:   . Difficulty of Paying Living Expenses:   Food Insecurity:   . Worried About Charity fundraiser in the Last Year:   . Arboriculturist in the Last Year:   Transportation Needs:   . Film/video editor (Medical):   Marland Kitchen Lack of Transportation (Non-Medical):     Physical Activity:   . Days of Exercise per Week:   . Minutes of Exercise per Session:   Stress:   . Feeling of Stress :   Social Connections:   . Frequency of Communication with Friends and Family:   . Frequency of Social Gatherings with Friends and Family:   . Attends Religious Services:   . Active Member of Clubs or Organizations:   . Attends Archivist Meetings:   Marland Kitchen Marital Status:   Intimate Partner Violence:   . Fear of Current or Ex-Partner:   . Emotionally Abused:   Marland Kitchen Physically Abused:   . Sexually Abused:     Family History  Problem Relation Age of Onset  . Diabetes Mother   . Heart disease Mother   . Heart attack Mother   . Hypertension Mother   . Breast cancer Mother   . Colon polyps Father   . Diabetes Father   . Heart disease Father   . Heart attack Father   . Stroke Father   . Hypertension Father   . Diabetes Brother   . Diabetes Sister   . Heart disease Brother   . Heart disease Sister   . Diabetes Maternal Grandmother   . Hypertension Maternal Grandmother   . Hypertension Brother   . Hypertension Sister   . Breast cancer Cousin   . Colon cancer Cousin   . Esophageal cancer Neg Hx   . Rectal cancer Neg Hx   . Stomach cancer Neg Hx     Past Surgical History:  Procedure Laterality Date  . BIOPSY  02/13/2018   Procedure: BIOPSY;  Surgeon: Ladene Artist, MD;  Location: [facility];  Service: Endoscopy;;  . BREAST SURGERY Right 2019    breast biopsy,  per pt benign  . CARDIAC CATHETERIZATION  02-27-2009   dr al little   diffuse 3V CAD , involving RCA, CFx, and D1;  inferior wall abnormalities, low normal ef 50%  . CATARACT EXTRACTION Right   . CATARACT EXTRACTION W/ INTRAOCULAR LENS IMPLANT Left 10-11-2007   _0   . COLONOSCOPY WITH PROPOFOL N/A 02/13/2018   Procedure: COLONOSCOPY WITH PROPOFOL;  Surgeon: Ladene Artist, MD;  Location: [facility];  Service: Endoscopy;  Laterality: N/A;  . DILATION AND CURETTAGE OF UTERUS  x2 last  one 2000  . EYE SURGERY Bilateral 2009 to 2010   x2  left eye;  x2  right eye   . HYSTEROSCOPY WITH D & C N/A 10/11/2018   Procedure: DILATATION AND CURETTAGE /HYSTEROSCOPY;  Surgeon: [name], MD;  Location: [facility];  Service: Gynecology;  Laterality: N/A;  . INCISION AND DRAINAGE PERIRECTAL ABSCESS  04/03/2012  . INTRAUTERINE DEVICE (IUD) INSERTION  10/11/2018   Procedure: INTRAUTERINE DEVICE (IUD) INSERTION;  Surgeon: [name], MD;  Location: [facility];  Service: Gynecology;;  . IR US GUIDE [place] LEFT  02/13/2018  . IR VENIPUNCTURE 34YRS/OLDER BY MD  02/13/2018  . POLYPECTOMY  02/13/2018   Procedure: POLYPECTOMY;  Surgeon: Ladene Artist, MD;  Location: [facility];  Service: Endoscopy;;    ROS: Review of Systems Negative  except as stated above  PHYSICAL EXAM: BP (!) 147/77   Pulse 79   Temp (!) 97.5 F (36.4 C)   Resp 16   Wt 284 lb 6.4 oz (129 kg)   LMP 04/02/2012   SpO2 99%   BMI 55.54 kg/m   Wt Readings from Last 3 Encounters:  12/24/19 284 lb 6.4 oz (129 kg)  03/28/19 272 lb (123.4 kg)  01/11/19 283 lb (128.4 kg)  Sitting: BP 156/85, pulse 80 Standing: BP 141/77 pulse 88  Physical Exam  General appearance - alert, well appearing, morbidly obese older African-American female and in no distress Mental status - normal mood, behavior, speech, dress, motor activity, and thought processes Eyes -cornea of the right eye is opaque.  Slightly pale conjunctiva Nose - normal and patent, no erythema, discharge or polyps Mouth -tongue is mildly dry.  She is edentulous.   Neck -no cervical lymphadenopathy.  No thyroid enlargement.   Chest - clear to auscultation, no wheezes, rales or rhonchi, symmetric air entry Heart - normal rate, regular rhythm, normal S1, S2, no murmurs, rubs, clicks or gallops Neurological -except for no vision in the right eye, cranial nerves are grossly intact.  Grip 5/5 bilaterally.  Tinel signs negative.  Power proximally and distally in the  upper extremities 5/5 bilaterally.  Power lower extremities 4+/5 bilaterally proximal and distal.  Gross sensation intact in the upper and lower extremities.  Patient ambulates with a cane.  Gait is wide-based, slow with low foot to floor clearance.  Romberg positive.  She sometimes has to hold onto other objects in the room.  She was unable to get onto the exam table so she was examined in the chair. Extremities -trace edema in both lower legs.  1+ edema in the ankles and feet. Diabetic Foot Exam - Simple   Simple Foot Form Visual Inspection See comments: Yes Sensation Testing See comments: Yes Pulse Check Posterior Tibialis and Dorsalis pulse intact bilaterally: Yes Comments She is slightly flat-footed.  She has decreased sensation on the plantar surface of the feet.  No ulcers or calluses appreciated.     Depression screen [facility] 2/9 12/24/2019 09/29/2018 09/19/2018  Decreased Interest 1 0 0  Down, Depressed, Hopeless 2 0 0  PHQ - 2 Score 3 0 0  Altered sleeping 2 0 0  Tired, decreased energy _0 Change in appetite 3 0 0  Feeling bad or failure about yourself  2 0 0  Trouble concentrating 2 0 0  Moving slowly or fidgety/restless 2 0 0  Suicidal thoughts 0 0 0  PHQ-9 Score _1 Some recent data might be hidden    CMP Latest Ref Rng & Units 01/15/2019 12/28/2018 09/29/2018  Glucose 70 - 99 mg/dL 278(H) 187(H) 67  BUN 8 - 23 mg/dL _2 Creatinine 0.44 - 1.00 mg/dL 1.44(H) 1.24(H) 1.22(H)  Sodium 135 - 145 mmol/L 137 141 146(H)  Potassium 3.5 - 5.1 mmol/L 4.1 3.4(L) 3.9  Chloride 98 - 111 mmol/L 108 112(H) 109(H)  CO2 22 - 32 mmol/L 19(L) 20(L) 22  Calcium 8.9 - 10.3 mg/dL 8.5(L) 8.6(L) 9.1  Total Protein 6.5 - 8.1 g/dL 7.4 7.8 -  Total Bilirubin 0.3 - 1.2 mg/dL 0.6 0.5 -  Alkaline Phos 38 - 126 U/L 68 60 -  AST 15 - 41 U/L 14(L) 14(L) -  ALT 0 - 44 U/L 14 14 -   Lipid Panel     Component Value Date/Time  CHOL 107 12/20/2017 0949   TRIG 86 12/20/2017 0949   HDL 35  (L) 12/20/2017 0949   CHOLHDL 3.1 12/20/2017 0949   CHOLHDL 2.9 09/12/2015 1147   VLDL 12 09/12/2015 1147   LDLCALC 55 12/20/2017 0949    CBC    Component Value Date/Time   WBC 19.6 (H) 01/15/2019 1745   RBC 4.15 01/15/2019 1745   HGB 12.3 01/15/2019 1745   HGB 12.0 09/29/2018 1153   HCT 38.5 01/15/2019 1745   HCT 38.6 09/29/2018 1153   PLT 276 01/15/2019 1745   PLT 307 09/29/2018 1153   MCV 92.8 01/15/2019 1745   MCV 91 09/29/2018 1153   MCH 29.6 01/15/2019 1745   MCHC 31.9 01/15/2019 1745   RDW 14.4 01/15/2019 1745   RDW 12.5 09/29/2018 1153   LYMPHSABS 2.3 01/15/2019 1745   MONOABS 1.2 (H) 01/15/2019 1745   EOSABS 0.1 01/15/2019 1745   BASOSABS 0.1 01/15/2019 1745    ASSESSMENT AND PLAN: 1. Type 2 diabetes mellitus with other circulatory complication, with long-term current use of insulin ([place]) .Dietary counseling given.  Encouraged her to eliminate fried foods from the diet and cut out sugary drinks. Refill given on lancets so that she can continue checking blood sugars.  Increase Humalog 75/25 to 54 units twice a day.  - POCT glucose (manual entry) - POCT glycosylated hemoglobin (Hb A1C) - Accu-Chek Softclix Lancets lancets; Use as instructed for 3 times daily blood glucose monitoring  Dispense: 100 each; Refill: 5 - insulin lispro protamine-lispro (HUMALOG MIX 75/25) (75-25) 100 UNIT/ML SUSP injection; INJECT SUBCUTANEOUSLY 54  UNITS TWICE DAILY WITH  MEALS. Please make PCP appointment.  Dispense: 30 mL; Refill: 0 - Comprehensive metabolic panel - Lipid panel  2. Essential hypertension Not at goal but patient has not taken medicines as yet for today.  She will take them when she returns home.  3. Cerebellar ataxia in diseases classified elsewhere ([place]) 4. History of recent fall 5. Dizziness Advised to go slow with position changes. We will get an MRI of the head to evaluate for possible old CVA. Will refer for physical therapy.  Patient initially reluctant but  I told her that it would be helpful for them to teach her safety techniques to prevent falls and also evaluate whether she needs a different assistive device besides the cane. - Ambulatory referral to Physical Therapy  6. Bilateral carpal tunnel syndrome Diagnosis discussed. Prescription given for pair of cock-up wrist splints.  7. Class 3 severe obesity due to excess calories with serious comorbidity and body mass index (BMI) of 50.0 to 59.9 in adult [facility]) See #1 above  8. Coronary artery disease of native artery of native heart with stable angina pectoris (HCC) Continue beta-blocker, statin and aspirin  9. Iron deficiency Advised patient that we will check CBC today.  If she is still anemic I recommend using a stool softener like MiraLAX with the iron - CBC  10. Vitamin B12 deficiency - Vitamin B12  11. Gastroesophageal reflux disease without esophagitis Having to take Tums in addition to Protonix.  Will refer to GI - Ambulatory referral to Gastroenterology  12. Encounter for screening mammogram for malignant neoplasm of breast - MM Digital Screening; Future  13. Post-menopausal bleeding Advised patient to get the pelvic ultrasound as ordered by the GYN.  Right ovarian mass needs to be evaluated.  I will have my CMA reschedule this ultrasound for her  14. Need for vaccination against Streptococcus pneumoniae using pneumococcal conjugate vaccine 13  Given  15. Positive depression screening Positive today but we did not have time to discuss this further.  I will have the LCSW follow-up with her.     Patient was given the opportunity to ask questions.  Patient verbalized understanding of the plan and was able to repeat key elements of the plan.   Orders Placed This Encounter  Procedures  . MR Brain W Wo Contrast  . MM Digital Screening  . CBC  . Comprehensive metabolic panel  . Lipid panel  . Vitamin B12  . Ambulatory referral to Gastroenterology  . Ambulatory referral  to Physical Therapy  . POCT glucose (manual entry)  . POCT glycosylated hemoglobin (Hb A1C)     Requested Prescriptions   Signed Prescriptions Disp Refills  . Accu-Chek Softclix Lancets lancets 100 each 5    Sig: Use as instructed for 3 times daily blood glucose monitoring  . insulin lispro protamine-lispro (HUMALOG MIX 75/25) (75-25) 100 UNIT/ML SUSP injection 30 mL 0    Sig: INJECT SUBCUTANEOUSLY 54  UNITS TWICE DAILY WITH  MEALS. Please make PCP appointment.    Return in about 3 months (around 03/25/2020).  Karle Plumber, MD, FACP

## 2019-12-24 NOTE — Patient Instructions (Addendum)
Increase your insulin to 54 units twice a day.  Try to monitor your blood sugars daily.  Blood sugars before meals should be between 90-130.  If you check 2 hours after a meal, he should be less than 180.  I have referred you for some physical therapy to help with your balance issues and help prevent falls.  Referral submitted for you to see the gastroenterologist. Referral submitted for your mammogram.  Pneumococcal Conjugate Vaccine (PCV13): What You Need to Know 1. Why get vaccinated? Pneumococcal conjugate vaccine (PCV13) can prevent pneumococcal disease. Pneumococcal disease refers to any illness caused by pneumococcal bacteria. These bacteria can cause many types of illnesses, including pneumonia, which is an infection of the lungs. Pneumococcal bacteria are one of the most common causes of pneumonia. Besides pneumonia, pneumococcal bacteria can also cause:  Ear infections  Sinus infections  Meningitis (infection of the tissue covering the brain and spinal cord)  Bacteremia (bloodstream infection) Anyone can get pneumococcal disease, but children under 50 years of age, people with certain medical conditions, adults 22 years or older, and cigarette smokers are at the highest risk. Most pneumococcal infections are mild. However, some can result in long-term problems, such as brain damage or hearing loss. Meningitis, bacteremia, and pneumonia caused by pneumococcal disease can be fatal. 2. PCV13 PCV13 protects against 13 types of bacteria that cause pneumococcal disease. Infants and young children usually need 4 doses of pneumococcal conjugate vaccine, at 2, 4, 6, and 24-44 months of age. In some cases, a child might need fewer than 4 doses to complete PCV13 vaccination. A dose of PCV23 vaccine is also recommended for anyone 2 years or older with certain medical conditions if they did not already receive PCV13. This vaccine may be given to adults 22 years or older based on discussions  between the patient and health care provider. 3. Talk with your health care provider Tell your vaccine provider if the person getting the vaccine:  Has had an allergic reaction after a previous dose of PCV13, to an earlier pneumococcal conjugate vaccine known as PCV7, or to any vaccine containing diphtheria toxoid (for example, DTaP), or has any severe, life-threatening allergies.  In some cases, your health care provider may decide to postpone PCV13 vaccination to a future visit. People with minor illnesses, such as a cold, may be vaccinated. People who are moderately or severely ill should usually wait until they recover before getting PCV13. Your health care provider can give you more information. 4. Risks of a vaccine reaction  Redness, swelling, pain, or tenderness where the shot is given, and fever, loss of appetite, fussiness (irritability), feeling tired, headache, and chills can happen after PCV13. Young children may be at increased risk for seizures caused by fever after PCV13 if it is administered at the same time as inactivated influenza vaccine. Ask your health care provider for more information. People sometimes faint after medical procedures, including vaccination. Tell your provider if you feel dizzy or have vision changes or ringing in the ears. As with any medicine, there is a very remote chance of a vaccine causing a severe allergic reaction, other serious injury, or death. 5. What if there is a serious problem? An allergic reaction could occur after the vaccinated person leaves the clinic. If you see signs of a severe allergic reaction (hives, swelling of the face and throat, difficulty breathing, a fast heartbeat, dizziness, or weakness), call 9-1-1 and get the person to the nearest hospital. For other signs that concern  you, call your health care provider. Adverse reactions should be reported to the Vaccine Adverse Event Reporting System (VAERS). Your health care provider will  usually file this report, or you can do it yourself. Visit the VAERS website at www.vaers.SamedayNews.es or call 316-163-5843. VAERS is only for reporting reactions, and VAERS staff do not give medical advice. 6. The National Vaccine Injury Compensation Program The [employer] Vaccine Injury Compensation Program (VICP) is a federal program that was created to compensate people who may have been injured by certain vaccines. Visit the VICP website at GoldCloset.com.ee or call 406-007-5472 to learn about the program and about filing a claim. There is a time limit to file a claim for compensation. 7. How can I learn more?  Ask your health care provider.  Call your local or state health department.  Contact the Centers for Disease Control and Prevention (CDC): ? Call 254 244 3564 (1-800-CDC-INFO) or ? Visit CDC's website at http://hunter.com/ Vaccine Information Statement PCV13 Vaccine (05/31/2018) This information is not intended to replace advice given to you by your health care provider. Make sure you discuss any questions you have with your health care provider. Document Revised: 11/07/2018 Document Reviewed: 02/28/2018 Elsevier Patient Education  [place].

## 2019-12-24 NOTE — Progress Notes (Signed)
Pt states her stomach is cramping  Pt states she has a headache and eye ache

## 2019-12-25 ENCOUNTER — Encounter: Payer: Self-pay | Admitting: Gastroenterology

## 2019-12-25 ENCOUNTER — Telehealth: Payer: Self-pay

## 2019-12-25 LAB — COMPREHENSIVE METABOLIC PANEL
ALT: 8 IU/L (ref 0–32)
AST: 10 IU/L (ref 0–40)
Albumin/Globulin Ratio: 1.1 — ABNORMAL LOW (ref 1.2–2.2)
Albumin: 3.8 g/dL (ref 3.8–4.8)
Alkaline Phosphatase: 59 IU/L (ref 48–121)
BUN/Creatinine Ratio: 13 (ref 12–28)
BUN: 17 mg/dL (ref 8–27)
Bilirubin Total: 0.4 mg/dL (ref 0.0–1.2)
CO2: 21 mmol/L (ref 20–29)
Calcium: 9.1 mg/dL (ref 8.7–10.3)
Chloride: 105 mmol/L (ref 96–106)
Creatinine, Ser: 1.33 mg/dL — ABNORMAL HIGH (ref 0.57–1.00)
GFR calc Af Amer: 48 mL/min/{1.73_m2} — ABNORMAL LOW (ref >59)
GFR calc non Af Amer: 42 mL/min/{1.73_m2} — ABNORMAL LOW (ref >59)
Globulin, Total: 3.5 g/dL (ref 1.5–4.5)
Glucose: 250 mg/dL — ABNORMAL HIGH (ref 65–99)
Potassium: 4.3 mmol/L (ref 3.5–5.2)
Sodium: 141 mmol/L (ref 134–144)
Total Protein: 7.3 g/dL (ref 6.0–8.5)

## 2019-12-25 LAB — CBC
Hematocrit: 40.9 % (ref 34.0–46.6)
Hemoglobin: 13.3 g/dL (ref 11.1–15.9)
MCH: 30.7 pg (ref 26.6–33.0)
MCHC: 32.5 g/dL (ref 31.5–35.7)
MCV: 95 fL (ref 79–97)
Platelets: 244 10*3/uL (ref 150–450)
RBC: 4.33 x10E6/uL (ref 3.77–5.28)
RDW: 11.9 % (ref 11.7–15.4)
WBC: 11.8 10*3/uL — ABNORMAL HIGH (ref 3.4–10.8)

## 2019-12-25 LAB — VITAMIN B12: Vitamin B-12: 973 pg/mL (ref 232–1245)

## 2019-12-25 LAB — LIPID PANEL
Chol/HDL Ratio: 4.1 ratio (ref 0.0–4.4)
Cholesterol, Total: 131 mg/dL (ref 100–199)
HDL: 32 mg/dL — ABNORMAL LOW (ref >39)
LDL Chol Calc (NIH): 79 mg/dL (ref 0–99)
Triglycerides: 108 mg/dL (ref 0–149)
VLDL Cholesterol Cal: 20 mg/dL (ref 5–40)

## 2019-12-25 NOTE — Progress Notes (Signed)
Let patient know that she has mild elevation in her white blood cell count but this has improved compared to 1 year ago.  Kidney function is not 100% but stable compared to 1 year ago.  Liver function tests are normal.  LDL cholesterol is 79 with goal being less than 70.  Please confirm that she is taking the atorvastatin 80 mg consistently.

## 2019-12-25 NOTE — Telephone Encounter (Signed)
Contacted pt to go over lab results was unable to reach pt due to call can't be completed at this time. Will try pt again later

## 2020-01-01 DIAGNOSIS — H401133 Primary open-angle glaucoma, bilateral, severe stage: Secondary | ICD-10-CM | POA: Diagnosis not present

## 2020-01-03 ENCOUNTER — Other Ambulatory Visit: Payer: Self-pay

## 2020-01-04 ENCOUNTER — Ambulatory Visit (HOSPITAL_COMMUNITY): Admission: RE | Admit: 2020-01-04 | Payer: Medicare Other | Source: Ambulatory Visit

## 2020-01-04 ENCOUNTER — Ambulatory Visit (HOSPITAL_COMMUNITY)
Admission: RE | Admit: 2020-01-04 | Discharge: 2020-01-04 | Disposition: A | Discharge status: Home / Self Care | Payer: Medicare Other | Source: Ambulatory Visit | Attending: Internal Medicine | Admitting: Internal Medicine

## 2020-01-04 NOTE — Progress Notes (Signed)
Patient was scheduled for MRI Brain wo/w today at [facility]. Patient was a no call no show for her MRi. Please call [phone] to RS patient. Thank you!

## 2020-01-07 ENCOUNTER — Other Ambulatory Visit: Payer: Self-pay

## 2020-01-07 ENCOUNTER — Ambulatory Visit: Payer: Medicare Other | Attending: Internal Medicine | Admitting: Physical Therapy

## 2020-01-07 DIAGNOSIS — R2689 Other abnormalities of gait and mobility: Secondary | ICD-10-CM | POA: Insufficient documentation

## 2020-01-07 DIAGNOSIS — M6281 Muscle weakness (generalized): Secondary | ICD-10-CM | POA: Insufficient documentation

## 2020-01-08 ENCOUNTER — Telehealth: Payer: Self-pay | Admitting: Licensed Clinical Social Worker

## 2020-01-08 ENCOUNTER — Encounter: Payer: Self-pay | Admitting: Physical Therapy

## 2020-01-08 NOTE — Telephone Encounter (Signed)
Call placed to patient regarding [facility] referral. Voicemail has not been established; therefore, LCSW was unable to request a return phone call.

## 2020-01-08 NOTE — Therapy (Signed)
[place], [place], [address] Phone: [phone]   Fax:  [phone]  Physical Therapy Evaluation  Patient Details  Name: JAKEIA CARRERAS MRN: [id number] Date of Birth: 1956/06/18 Referring Provider (PT): Dr Karle Plumber    Encounter Date: 01/07/2020  PT End of Session - 01/08/20 1006    Visit Number  1    Number of Visits  12    Date for PT Re-Evaluation  02/19/20    Authorization Type  Medicare    PT Start Time  1100    PT Stop Time  1144    PT Time Calculation (min)  44 min    Activity Tolerance  Patient limited by fatigue    Behavior During Therapy  [facility] for tasks assessed/performed       Past Medical History:  Diagnosis Date  . Blind right eye   . Chronic back pain    "my whole back" (03/18/2015)  . Chronic chest pain    takes isosorbide  . Chronic diastolic heart failure ([place]) 9/13   echo 03/20/15 LV Ef of 60-65%, grade 2DD and PA peak pressure: 36 mm Hg   . CKD (chronic kidney disease), stage III   . Coronary artery disease cardiologist-- dr croitoru   per cardiac cath 02-07-2009 -- diffuse 3V CAD involving RCA, CFx, and D1,  pt not a candidate for bypass surgery or angioplasty,  medical management  . Diabetic neuropathy ([place])   . Diabetic retinopathy ([place])    bilateral proliferative  . Diastolic CHF, chronic ([place])   . Dyspnea    "always"  . Edema of both lower extremities   . Generalized weakness   . GERD (gastroesophageal reflux disease)   . Glaucoma, both eyes   . History of non-ST elevation myocardial infarction (NSTEMI)    02-25-2009;  05-15-2009;  01-08-2010;  04-04-2012 this MI was post-op surgery for abscess on 04-03-2012  . History of sepsis   . Hyperlipemia   . Hypertension   . Insulin dependent type 2 diabetes mellitus ([place])    followed by pcp  . Iron deficiency anemia due to chronic blood loss    uterine bleeding  . Legally blind in left eye, as defined in [place]     per pt states vision "is foggy"  . Mild obstructive sleep apnea    study in epic 04-19-2012 mild osa,  recommendation's were wt. loss, dental  appliance, surgery, or cpap  . Morbid obesity with BMI of 40.0-44.9, adult ([place])   . OA (osteoarthritis)    knees  . PSVT (paroxysmal supraventricular tachycardia) ([place])   . Renal insufficiency    pt. denies  . Seasonal allergies   . Wears glasses     Past Surgical History:  Procedure Laterality Date  . BIOPSY  02/13/2018   Procedure: BIOPSY;  Surgeon: Ladene Artist, MD;  Location: [facility];  Service: Endoscopy;;  . BREAST SURGERY Right 2019    breast biopsy,  per pt benign  . CARDIAC CATHETERIZATION  02-27-2009   dr al little   diffuse 3V CAD , involving RCA, CFx, and D1;  inferior wall abnormalities, low normal ef 50%  . CATARACT EXTRACTION Right   . CATARACT EXTRACTION W/ INTRAOCULAR LENS IMPLANT Left 10-11-2007   @[facility]   . COLONOSCOPY WITH PROPOFOL N/A 02/13/2018   Procedure: COLONOSCOPY WITH PROPOFOL;  Surgeon: Ladene Artist, MD;  Location: [facility];  Service: Endoscopy;  Laterality: N/A;  . DILATION AND CURETTAGE OF UTERUS  x2 last one 2000  . EYE SURGERY Bilateral 2009 to 2010   x2  left eye;  x2  right eye   . HYSTEROSCOPY WITH D & C N/A 10/11/2018   Procedure: DILATATION AND CURETTAGE /HYSTEROSCOPY;  Surgeon: [name], MD;  Location: [facility];  Service: Gynecology;  Laterality: N/A;  . INCISION AND DRAINAGE PERIRECTAL ABSCESS  04/03/2012  . INTRAUTERINE DEVICE (IUD) INSERTION  10/11/2018   Procedure: INTRAUTERINE DEVICE (IUD) INSERTION;  Surgeon: [name], MD;  Location: [facility];  Service: Gynecology;;  . IR US GUIDE [place] LEFT  02/13/2018  . IR VENIPUNCTURE 65YRS/OLDER BY MD  02/13/2018  . POLYPECTOMY  02/13/2018   Procedure: POLYPECTOMY;  Surgeon: Ladene Artist, MD;  Location: [facility];  Service: Endoscopy;;    There were no vitals filed for this visit.   Subjective Assessment - 01/07/20 1106     Subjective  Patient reports that for about a year and a half she has been having problems with falls. She feles like she is walking at times and her legs just give out. Other times she has tripped over items. She uses a cane for primary mobility. She feels safer with her walker but cant use it in her appartment becuase of small spaces.    Pertinent History  carpel tunnel, Knee OA,    How long can you stand comfortably?  can stand at the counter and do dishes.    How long can you walk comfortably?  can not walk community distances    Diagnostic tests  X:ray L/R: advanced knee degeneration    Patient Stated Goals  to have increased stability/ decrease her risk of falls.    Currently in Pain?  Yes    Pain Score  6     Pain Orientation  Left;Right    Pain Descriptors / Indicators  Aching    Pain Type  Chronic pain    Pain Onset  More than a month ago    Pain Frequency  Constant    Aggravating Factors   standing, walking, weather    Pain Relieving Factors  rest    Effect of Pain on Daily Activities  difficulty perfroming ADL's         [facility] PT Assessment - 01/08/20 0001      Assessment   Medical Diagnosis  Frequent falls     Referring Provider (PT)  Dr Karle Plumber     Onset Date/Surgical Date  --   1.5 years ago    Hand Dominance  Right    Next MD Visit  Nothing scheduled    Prior Therapy  2-3 years ago for her heart       Precautions   Precautions  None      Restrictions   Weight Bearing Restrictions  No      Balance Screen   Has the patient fallen in the past 6 months  Yes    How many times?  2    Has the patient had a decrease in activity level because of a fear of falling?   Yes    Is the patient reluctant to leave their home because of a fear of falling?   Yes      Home Environment   Additional Comments  has steps into her appartment. Does not have a rail. 2 steps into the buidling       Prior Function   Level of Independence  Requires assistive device for  independence  Vocation  Retired    Leisure   nothing       Charity fundraiser Status  Within Functional Limits for tasks assessed    Attention  Focused    Focused Attention  Appears intact    Memory  Appears intact    Awareness  Appears intact    Problem Solving  Appears intact    Executive Function  Reasoning      Observation/Other Assessments   Focus on Therapeutic Outcomes (FOTO)   No FOTO       Sensation   Light Touch  Appears Intact      Coordination   Gross Motor Movements are Fluid and Coordinated  Yes    Fine Motor Movements are Fluid and Coordinated  Yes      AROM   Overall AROM Comments  limited active movement of bilateral hips      PROM   Overall PROM Comments  Not tested. unable to get patient into supine position       Strength   Right Shoulder Flexion  4/5    Right Shoulder Internal Rotation  4+/5    Right Shoulder External Rotation  4/5    Left Shoulder Flexion  4/5    Left Shoulder Internal Rotation  4/5    Left Shoulder External Rotation  4/5    Right Hip Flexion  4/5    Right Hip ABduction  4/5    Right Hip ADduction  4/5    Left Hip Flexion  4/5    Left Hip ABduction  4/5    Left Hip ADduction  4/5    Right Knee Flexion  5/5    Right Knee Extension  5/5    Left Knee Flexion  5/5    Left Knee Extension  5/5      Palpation   Palpation comment  No unexpected tenderness to palpation       Ambulation/Gait   Gait Comments  ambualted 33' with a 2 on the dyspnea scale.       High Level Balance   High Level Balance Comments  narrow base.: Min a tandem: unable to complete.                   Objective measurements completed on examination: See above findings.      [facility] Adult PT Treatment/Exercise - 01/08/20 0001      Transfers   Comments  Slow transfer from sit to stand. Sit to stand with min guard out of a standard chair       Exercises   Exercises  Knee/Hip      Knee/Hip Exercises: Seated   Long Arc Quad  Limitations  x10 90-45     Other Seated Knee/Hip Exercises  seated hip abduction 2x10 yellow     Other Seated Knee/Hip Exercises  standing ball squeeze x10              PT Education - 01/08/20 1005    Education Details  HEP, improtance of starting a general exercise program    Person(s) Educated  Patient    Methods  Demonstration;Explanation;Tactile cues;Verbal cues    Comprehension  Verbalized understanding;Returned demonstration;Verbal cues required;Tactile cues required       PT Short Term Goals - 01/07/20 1541      PT SHORT TERM GOAL #1   Title  Patient will increase gross bilateral LE strength to 4+/5    Time  3    Period  Weeks  Status  New    Target Date  01/28/20      PT SHORT TERM GOAL #2   Title  Patient will transfer sit to stand 3x without increased low back pain.    Time  3    Period  Weeks    Status  New    Target Date  01/28/20      PT SHORT TERM GOAL #3   Title  Patient will ambualte 71' with single point cane without increased low back pain    Time  3    Period  Weeks    Status  New    Target Date  01/28/20        PT Long Term Goals - 01/07/20 1600      PT LONG TERM GOAL #1   Title  Patient will ambualte 200' without increased pain in order to walk in her house    Time  6    Period  Months    Status  New    Target Date  02/18/20      PT LONG TERM GOAL #2   Title  Patient will report no falls for 3 weeks    Time  6    Period  Weeks    Status  New    Target Date  02/18/20      PT LONG TERM GOAL #3   Title  Patient will bend to pick object off the floor without falling    Time  6    Period  Weeks    Status  New    Target Date  02/18/20             Plan - 01/07/20 1345    Clinical Impression Statement  Patient is a 64 year old female who presents with repeated falls, general deconditioning, and decreased functional mobility. She was able to ambualte 71' with her cane before requiring a seated rest break. She has had two  falls recentyl. She tripped one time and another time she leaned forward and lost her balance. She becomes fatigued quickly with standing and walking activity. She would benefit from a general strengthen and exercise program as well as balance training. She was ggiven three exercises today and advised to work on getting into a routine.    Personal Factors and Comorbidities  Comorbidity 1    Comorbidities  bilateral Knee OA, Low back pain, chronic chest pain with activity (uses nitro); CHF, DMII,    Examination-Activity Limitations  Caring for Others;Reach Overhead;Stand;Stairs;Squat;Sit;Lift    Examination-Participation Restrictions  Church;Shop;Meal Prep;Community Activity    Stability/Clinical Decision Making  Unstable/Unpredictable   increasing falls, decreaseing mobility   Clinical Decision Making  High    Rehab Potential  Fair    PT Frequency  2x / week    PT Duration  6 weeks    PT Treatment/Interventions  ADLs/Self Care Home Management;Cryotherapy;Aquatic Therapy;Electrical Stimulation;Iontophoresis 4mg /ml Dexamethasone;Traction;Ultrasound;Gait training;Stair training;Therapeutic activities;Functional mobility training;Therapeutic exercise;Balance training;Neuromuscular re-education;Patient/family education;Manual techniques;Passive range of motion;Taping    PT Next Visit Plan  Do BERG balance ( ran out of time last time); review HEP; consider UE exercises. Progress to standing    PT Home Exercise Plan  laq 0-45; hip abduction yellow; pillow squeeze x10    Consulted and Agree with Plan of Care  Patient       Patient will benefit from skilled therapeutic intervention in order to improve the following deficits and impairments:  Abnormal gait  Visit Diagnosis: Other abnormalities of gait  and mobility  Muscle weakness (generalized)     Problem List Patient Active Problem List   Diagnosis Date Noted  . Functional urinary incontinence 09/07/2019  . Urge incontinence of urine  09/07/2019  . Legally blind in right eye, as defined in [place] 08/11/2018  . Bilateral carpal tunnel syndrome 04/13/2018  . Adenomatous polyp of colon 04/13/2018  . Fibrocystic breast changes, right 12/12/2017  . Drug-induced constipation 09/15/2017  . Primary osteoarthritis of both knees 04/05/2017  . Chronic bilateral low back pain 03/07/2017  . Chronic pain of right knee 03/07/2017  . Vitamin B12 deficiency 12/09/2016  . Incidental lung nodule, > 6mm and < 70mm 09/14/2016  . Vitreous hemorrhage of right eye ([place])   . Diabetic gastroparesis associated with type 2 diabetes mellitus ([place]) 09/12/2015  . Controlled type 2 diabetes mellitus with complication, with long-term current use of insulin ([place]) 09/12/2015  . Chronic renal insufficiency, stage III (moderate) 03/19/2015  . Prolonged Q-T interval on ECG 08/10/2013  . Dyslipidemia 04/02/2013  . Chronic diastolic heart failure, NYHA class 3 ([place]) 04/07/2012  . Presumed OSA (obstructive sleep apnea) 04/06/2012  . NSTEMI  post-op 04/04/12-medical Rx 04/04/2012  . Severe obesity (BMI >= 40) ([place]) 01/17/2012  . GERD 08/21/2009  . Complex endometrial hyperplasia with atypia 03/20/2009  . PERIPHERAL EDEMA 03/20/2009  . Coronary atherosclerosis-not a surgical or PCI candidate 2010 02/27/2009  . Proliferative diabetic retinopathy ([place]) 10/07/2007  . DETACHED RETINA, BILATERAL, HX OF 09/07/2007  . History of chronic Iron defeicency anemia with acute post op anemia, transfused after debridment 04/04/12 04/17/2007  . Essential hypertension 04/17/2007    Carney Living  PT DPT  01/08/2020, 10:15 AM  [facility] [address] [place], [place], [address] Phone: [phone]   Fax:  [phone]  Name: TYIA BINFORD MRN: [id number] Date of Birth: [birth year]

## 2020-01-13 ENCOUNTER — Other Ambulatory Visit: Payer: Self-pay | Admitting: Internal Medicine

## 2020-01-16 ENCOUNTER — Ambulatory Visit: Payer: Medicare Other | Admitting: Gastroenterology

## 2020-01-18 ENCOUNTER — Encounter: Payer: Self-pay | Admitting: Physical Therapy

## 2020-01-18 ENCOUNTER — Other Ambulatory Visit: Payer: Self-pay | Admitting: Internal Medicine

## 2020-01-18 ENCOUNTER — Ambulatory Visit: Payer: Medicare Other | Admitting: Physical Therapy

## 2020-01-18 ENCOUNTER — Other Ambulatory Visit: Payer: Self-pay

## 2020-01-18 ENCOUNTER — Telehealth: Payer: Self-pay | Admitting: *Deleted

## 2020-01-18 ENCOUNTER — Telehealth (INDEPENDENT_AMBULATORY_CARE_PROVIDER_SITE_OTHER): Payer: Medicare Other | Admitting: Cardiovascular Disease

## 2020-01-18 DIAGNOSIS — M6281 Muscle weakness (generalized): Secondary | ICD-10-CM

## 2020-01-18 DIAGNOSIS — I10 Essential (primary) hypertension: Secondary | ICD-10-CM

## 2020-01-18 DIAGNOSIS — E785 Hyperlipidemia, unspecified: Secondary | ICD-10-CM | POA: Diagnosis not present

## 2020-01-18 DIAGNOSIS — I5032 Chronic diastolic (congestive) heart failure: Secondary | ICD-10-CM

## 2020-01-18 DIAGNOSIS — E118 Type 2 diabetes mellitus with unspecified complications: Secondary | ICD-10-CM

## 2020-01-18 DIAGNOSIS — I25118 Atherosclerotic heart disease of native coronary artery with other forms of angina pectoris: Secondary | ICD-10-CM | POA: Diagnosis not present

## 2020-01-18 DIAGNOSIS — Z794 Long term (current) use of insulin: Secondary | ICD-10-CM

## 2020-01-18 DIAGNOSIS — R2689 Other abnormalities of gait and mobility: Secondary | ICD-10-CM

## 2020-01-18 DIAGNOSIS — N1831 Chronic kidney disease, stage 3a: Secondary | ICD-10-CM

## 2020-01-18 DIAGNOSIS — I2510 Atherosclerotic heart disease of native coronary artery without angina pectoris: Secondary | ICD-10-CM

## 2020-01-18 DIAGNOSIS — E669 Obesity, unspecified: Secondary | ICD-10-CM

## 2020-01-18 NOTE — Patient Instructions (Signed)
Medication Instructions:  No changes *If you need a refill on your cardiac medications before your next appointment, please call your pharmacy*   Lab Work: None ordered If you have labs (blood work) drawn today and your tests are completely normal, you will receive your results only by: Marland Kitchen MyChart Message (if you have MyChart) OR . A paper copy in the mail If you have any lab test that is abnormal or we need to change your treatment, we will call you to review the results.   Testing/Procedures: None ordered   Follow-Up: At [facility], you and your health needs are our priority.  As part of our continuing mission to provide you with exceptional heart care, we have created designated Provider Care Teams.  These Care Teams include your primary Cardiologist (physician) and Advanced Practice Providers (APPs -  Physician Assistants and Nurse Practitioners) who all work together to provide you with the care you need, when you need it.  We recommend signing up for the patient portal called "MyChart".  Sign up information is provided on this After Visit Summary.  MyChart is used to connect with patients for Virtual Visits (Telemedicine).  Patients are able to view lab/test results, encounter notes, upcoming appointments, etc.  Non-urgent messages can be sent to your provider as well.   To learn more about what you can do with MyChart, go to [email or web address].    Your next appointment:   12 month(s)  The format for your next appointment:   In Person  Provider:   You may see Sanda Klein, MD or one of the following Advanced Practice Providers on your designated Care Team:    Almyra Deforest, PA-C  Fabian Sharp, [place] or   Roby Lofts, [place]    Other Instructions We will call you when a blood pressure cuff is available for pick up at the office.

## 2020-01-18 NOTE — Progress Notes (Signed)
Virtual Visit via Telephone Note   This visit type was conducted due to national recommendations for restrictions regarding the COVID-19 Pandemic (e.g. social distancing) in an effort to limit this patient's exposure and mitigate transmission in our community.  Due to her co-morbid illnesses, this patient is at least at moderate risk for complications without adequate follow up.  This format is felt to be most appropriate for this patient at this time.  The patient did not have access to video technology/had technical difficulties with video requiring transitioning to audio format only (telephone).  All issues noted in this document were discussed and addressed.  No physical exam could be performed with this format.  Please refer to the patient's chart for her  consent to telehealth for [facility].   Date:  01/18/2020   ID:  Levi, Crass 07-08-1956, MRN [id number]  Patient Location: Home Provider Location: Home  PCP:  Ladell Pier, MD  Cardiologist:  Sanda Klein, MD  Electrophysiologist:  None   Evaluation Performed:  Follow-Up Visit  Chief Complaint:  CAD, CHF  History of Present Illness:    [name] is a 64 y.o. female with super obesity, long-standing severe diabetes mellitus with multiple microvascular and macrovascular complications. She has extensive multivessel coronary artery disease with diffuse involvement, making her a poor candidate for either surgical or percutaneous revascularization.  She has chronic diastolic heart failure as well as chronic kidney disease.  She has had a remarkably uneventful year from a cardiovascular point of view.  She has not required hospitalization or adjustment in her diuretic doses and has not had angina pectoris.  Part of this may be due to the fact that she is unable to exercise as much.  Her pain in her knees and back limits her activity level.  She has not taken any extra nitroglycerin.  She does not have orthopnea  or PND.  She does develop leg edema in the evenings that resolves if she keeps her legs elevated overnight.  She does not have problems with dizziness, lightheadedness or syncope.  Her blood pressure at her last office visit with Dr. Wynetta Emery was mildly elevated, but no changes were made in her medications.  She is unable to check her blood pressure today.  She continues to see Dr. Pearson Grippe for her chronic kidney disease.  Glycemic control is poor with a hemoglobin A1c of 10% last month.  Her insulin dose has been adjusted.  Her LDL cholesterol on her lipid profile is borderline acceptable at 79 and she has a chronically low HDL cholesterol.  She continues to take the maximum dose of atorvastatin and its possible that the increase in LDL is due to the deterioration in diabetes control.  The patient does not have symptoms concerning for COVID-19 infection (fever, chills, cough, or new shortness of breath).    Past Medical History:  Diagnosis Date  . Blind right eye   . Chronic back pain    "my whole back" (03/18/2015)  . Chronic chest pain    takes isosorbide  . Chronic diastolic heart failure ([place]) 9/13   echo 03/20/15 LV Ef of 60-65%, grade 2DD and PA peak pressure: 36 mm Hg   . CKD (chronic kidney disease), stage III   . Coronary artery disease cardiologist-- dr Isabele Lollar   per cardiac cath 02-07-2009 -- diffuse 3V CAD involving RCA, CFx, and D1,  pt not a candidate for bypass surgery or angioplasty,  medical management  . Diabetic  neuropathy ([place])   . Diabetic retinopathy ([place])    bilateral proliferative  . Diastolic CHF, chronic ([place])   . Dyspnea    "always"  . Edema of both lower extremities   . Generalized weakness   . GERD (gastroesophageal reflux disease)   . Glaucoma, both eyes   . History of non-ST elevation myocardial infarction (NSTEMI)    02-25-2009;  05-15-2009;  01-08-2010;  04-04-2012 this MI was post-op surgery for abscess on 04-03-2012  . History of sepsis   .  Hyperlipemia   . Hypertension   . Insulin dependent type 2 diabetes mellitus ([place])    followed by pcp  . Iron deficiency anemia due to chronic blood loss    uterine bleeding  . Legally blind in left eye, as defined in [place]    per pt states vision "is foggy"  . Mild obstructive sleep apnea    study in epic 04-19-2012 mild osa,  recommendation's were wt. loss, dental  appliance, surgery, or cpap  . Morbid obesity with BMI of 40.0-44.9, adult ([place])   . OA (osteoarthritis)    knees  . PSVT (paroxysmal supraventricular tachycardia) ([place])   . Renal insufficiency    pt. denies  . Seasonal allergies   . Wears glasses    Past Surgical History:  Procedure Laterality Date  . BIOPSY  02/13/2018   Procedure: BIOPSY;  Surgeon: Ladene Artist, MD;  Location: [facility];  Service: Endoscopy;;  . BREAST SURGERY Right 2019    breast biopsy,  per pt benign  . CARDIAC CATHETERIZATION  02-27-2009   dr al little   diffuse 3V CAD , involving RCA, CFx, and D1;  inferior wall abnormalities, low normal ef 50%  . CATARACT EXTRACTION Right   . CATARACT EXTRACTION W/ INTRAOCULAR LENS IMPLANT Left 10-11-2007   '@[facility]'   . COLONOSCOPY WITH PROPOFOL N/A 02/13/2018   Procedure: COLONOSCOPY WITH PROPOFOL;  Surgeon: Ladene Artist, MD;  Location: [facility];  Service: Endoscopy;  Laterality: N/A;  . DILATION AND CURETTAGE OF UTERUS  x2 last one 2000  . EYE SURGERY Bilateral 2009 to 2010   x2  left eye;  x2  right eye   . HYSTEROSCOPY WITH D & C N/A 10/11/2018   Procedure: DILATATION AND CURETTAGE /HYSTEROSCOPY;  Surgeon: [name], MD;  Location: [facility];  Service: Gynecology;  Laterality: N/A;  . INCISION AND DRAINAGE PERIRECTAL ABSCESS  04/03/2012  . INTRAUTERINE DEVICE (IUD) INSERTION  10/11/2018   Procedure: INTRAUTERINE DEVICE (IUD) INSERTION;  Surgeon: [name], MD;  Location: [facility];  Service: Gynecology;;  . IR US GUIDE [place] LEFT  02/13/2018  . IR VENIPUNCTURE 18YRS/OLDER BY MD  02/13/2018  .  POLYPECTOMY  02/13/2018   Procedure: POLYPECTOMY;  Surgeon: Ladene Artist, MD;  Location: [facility];  Service: Endoscopy;;     Current Meds  Medication Sig  . Accu-Chek Softclix Lancets lancets Use as instructed for 3 times daily blood glucose monitoring  . acetaminophen (TYLENOL) 500 MG tablet Take 1 tablet (500 mg total) by mouth every 6 (six) hours as needed. (Patient taking differently: Take 500 mg by mouth every 6 (six) hours as needed (pain.). )  . acetaZOLAMIDE (DIAMOX) 500 MG capsule Take 500 mg by mouth 2 (two) times daily.  Marland Kitchen aspirin EC 81 MG tablet Take 81 mg by mouth daily.  Marland Kitchen atorvastatin (LIPITOR) 80 MG tablet TAKE 1 TABLET BY MOUTH ONCE DAILY IN THE EVENING AT 6PM  . bismuth subsalicylate (PEPTO  BISMOL) 262 MG/15ML suspension Take 30 mLs by mouth every 6 (six) hours as needed for indigestion.  . brimonidine-timolol (COMBIGAN) 0.2-0.5 % ophthalmic solution Place 1 drop into both eyes 2 (two) times daily.   . calcium carbonate (TUMS - DOSED IN MG ELEMENTAL CALCIUM) 500 MG chewable tablet Chew 1 tablet by mouth 3 (three) times daily as needed for indigestion.   . Cyanocobalamin (VITAMIN B-12) 1000 MCG TABS Take 1,000 mcg by mouth daily.  Marland Kitchen diltiazem (DILACOR XR) 240 MG 24 hr capsule Take 1 capsule (240 mg total) by mouth daily.  . ferrous sulfate 325 (65 FE) MG tablet Take 325 mg by mouth daily with breakfast.  . furosemide (LASIX) 40 MG tablet TAKE 1 TABLET BY MOUTH  TWICE DAILY (TAKE WITH  FUROSEMIDE 80 MG TAB)  . furosemide (LASIX) 80 MG tablet TAKE 1 TABLET BY MOUTH TWO  TIMES DAILY WITH FUROSEMIDE 40MG  (TOTAL DOSE OF 120MG  TWO TIMES DAILY )  . glucose blood test strip Use TID before meals / Dx E11.9  . glucose monitoring kit (FREESTYLE) monitoring kit 1 each by Does not apply route 4 (four) times daily - after meals and at bedtime.  . insulin lispro protamine-lispro (HUMALOG MIX 75/25) (75-25) 100 UNIT/ML SUSP injection INJECT SUBCUTANEOUSLY 54  UNITS TWICE DAILY WITH   MEALS. Please make PCP appointment.  . isosorbide mononitrate (IMDUR) 120 MG 24 hr tablet TAKE 1 TABLET BY MOUTH ONCE DAILY  . losartan (COZAAR) 50 MG tablet Take 2 tablets by mouth once daily  . LUMIGAN 0.01 % SOLN Place 1 drop into the right eye at bedtime.   . megestrol (MEGACE) 40 MG tablet Take 1 tablet (40 mg total) by mouth 2 (two) times daily.  . Menthol-Methyl Salicylate (MUSCLE RUB EX) Apply 1 application topically daily as needed (knee pain).   Marland Kitchen metoCLOPramide (REGLAN) 5 MG tablet TAKE 1 TABLET BY MOUTH TWICE DAILY BEFORE THE TWO LARGEST MEALS OF THE DAY  . metoprolol (TOPROL-XL) 200 MG 24 hr tablet Take 1 tablet by mouth once daily  . nitroGLYCERIN (NITROSTAT) 0.4 MG SL tablet Place 1 tablet (0.4 mg total) under the tongue every 5 (five) minutes as needed for chest pain. Max 3 doses in 15 minutes. <PLEASE MAKE APPOINTMENT FOR REFILLS>  . pantoprazole (PROTONIX) 40 MG tablet TAKE ONE TABLET BY MOUTH ONCE DAILY (Patient taking differently: Take 40 mg by mouth daily. )  . potassium chloride SA (KLOR-CON) 20 MEQ tablet TAKE 2 TABLETS BY MOUTH IN  THE MORNING AND 1 TABLET IN THE EVENING  . RELION INSULIN SYRINGE 31G X 15/64" 1 ML MISC USE AS DIRECTED TWICE DAILY INSULIN ADMINISTRATION     Allergies:   Ativan [lorazepam] and Orange fruit [citrus]   Social History   Tobacco Use  . Smoking status: Never Smoker  . Smokeless tobacco: Never Used  Vaping Use  . Vaping Use: Never used  Substance Use Topics  . Alcohol use: No  . Drug use: Never     Family Hx: The patient's family history includes Breast cancer in her cousin and mother; Colon cancer in her cousin; Colon polyps in her father; Diabetes in her brother, father, maternal grandmother, mother, and sister; Heart attack in her father and mother; Heart disease in her brother, father, mother, and sister; Hypertension in her brother, father, maternal grandmother, mother, and sister; Stroke in her father. There is no history of  Esophageal cancer, Rectal cancer, or Stomach cancer.  ROS:   Please see the  history of present illness.    All other systems are reviewed and are negative.   Prior CV studies:   The following studies were reviewed today:  Echocardiogram 2017 showed normal left ventricular systolic function with EF 55-60%.  Although there was evidence of diastolic dysfunction filling  pressures were normal (E/e'=5).  Labs/Other Tests and Data Reviewed:    EKG:  An ECG dated 01/15/2019 was personally reviewed today and demonstrated:  Sinus rhythm, right bundle branch block (old), T wave inversion V1 to V4  Recent Labs: 12/24/2019: ALT 8; BUN 17; Creatinine, Ser 1.33; Hemoglobin 13.3; Platelets 244; Potassium 4.3; Sodium 141   Recent Lipid Panel Lab Results  Component Value Date/Time   CHOL 131 12/24/2019 10:49 AM   TRIG 108 12/24/2019 10:49 AM   HDL 32 (L) 12/24/2019 10:49 AM   CHOLHDL 4.1 12/24/2019 10:49 AM   CHOLHDL 2.9 09/12/2015 11:47 AM   LDLCALC 79 12/24/2019 10:49 AM    Wt Readings from Last 3 Encounters:  12/24/19 284 lb 6.4 oz (129 kg)  03/28/19 272 lb (123.4 kg)  01/11/19 283 lb (128.4 kg)     Objective:    Vital Signs:  LMP 04/02/2012    VITAL SIGNS:  reviewed Unable to examine  ASSESSMENT & PLAN:    1. CHF: She reports occasional ankle edema at the end of the day, with no other signs or symptoms of hypervolemia.  Need to balance treatment of heart failure with her renal dysfunction.  No change to medications.  Reminded her about the importance of sodium restriction and daily weight monitoring. 2. CAD: Angina pectoris is well controlled on a combination of beta-blocker and high-dose long-acting nitrates.  She has extensive CAD not amenable to the surgical or percutaneous revascularization. 3. HLP: LDL is slightly worse and is now slightly above the target range of 70.  She is on a maximum dose of atorvastatin.  Deterioration is probably related to worsening glycemic  control. 4. DM: Hemoglobin A1c has worsened from 7.5% a year ago to 10% now.  She is adjusting her medications with Dr. Wynetta Emery.  Lower levels of physical activity due to her back and knee problems may be playing a role in increasing glucose levels. 5. CKD 3: Followed by Dr. Joelyn Oms.  Creatinine is at baseline of 1.2-1.3. 6. Super obesity: Weight has not changed much over the last couple of years. 7. HTN: Unable to adjust her medications since she has not been able to check her blood pressure.  Will work on getting her blood pressure cuff.  Target BP 130/80.    COVID-19 Education: The signs and symptoms of COVID-19 were discussed with the patient and how to seek care for testing (follow up with PCP or arrange E-visit).  The importance of social distancing was discussed today.  Time:   Today, I have spent 21 minutes with the patient with telehealth technology discussing the above problems.     Medication Adjustments/Labs and Tests Ordered: Current medicines are reviewed at length with the patient today.  Concerns regarding medicines are outlined above.   Tests Ordered: No orders of the defined types were placed in this encounter.   Medication Changes: No orders of the defined types were placed in this encounter.  There are no Patient Instructions on file for this visit.  Disposition:  Follow up 12 months  Signed, Sanda Klein, MD  01/18/2020 8:07 AM    [place]

## 2020-01-18 NOTE — Telephone Encounter (Signed)
  Patient Consent for Virtual Visit         [name] has provided verbal consent on 01/18/2020 for a virtual visit (video or telephone).   CONSENT FOR VIRTUAL VISIT FOR:  [name]  By participating in this virtual visit I agree to the following:  I hereby voluntarily request, consent and authorize [facility] and its employed or contracted physicians, physician assistants, nurse practitioners or other licensed health care professionals (the Practitioner), to provide me with telemedicine health care services (the "Services") as deemed necessary by the treating Practitioner. I acknowledge and consent to receive the Services by the Practitioner via telemedicine. I understand that the telemedicine visit will involve communicating with the Practitioner through live audiovisual communication technology and the disclosure of certain medical information by electronic transmission. I acknowledge that I have been given the opportunity to request an in-person assessment or other available alternative prior to the telemedicine visit and am voluntarily participating in the telemedicine visit.  I understand that I have the right to withhold or withdraw my consent to the use of telemedicine in the course of my care at any time, without affecting my right to future care or treatment, and that the Practitioner or I may terminate the telemedicine visit at any time. I understand that I have the right to inspect all information obtained and/or recorded in the course of the telemedicine visit and may receive copies of available information for a reasonable fee.  I understand that some of the potential risks of receiving the Services via telemedicine include:  Marland Kitchen Delay or interruption in medical evaluation due to technological equipment failure or disruption; . Information transmitted may not be sufficient (e.g. poor resolution of images) to allow for appropriate medical decision making by the  Practitioner; and/or  . In rare instances, security protocols could fail, causing a breach of personal health information.  Furthermore, I acknowledge that it is my responsibility to provide information about my medical history, conditions and care that is complete and accurate to the best of my ability. I acknowledge that Practitioner's advice, recommendations, and/or decision may be based on factors not within their control, such as incomplete or inaccurate data provided by me or distortions of diagnostic images or specimens that may result from electronic transmissions. I understand that the practice of medicine is not an exact science and that Practitioner makes no warranties or guarantees regarding treatment outcomes. I acknowledge that a copy of this consent can be made available to me via my patient portal ([facility]), or I can request a printed copy by calling the office of [facility].    I understand that my insurance will be billed for this visit.   I have read or had this consent read to me. . I understand the contents of this consent, which adequately explains the benefits and risks of the Services being provided via telemedicine.  . I have been provided ample opportunity to ask questions regarding this consent and the Services and have had my questions answered to my satisfaction. . I give my informed consent for the services to be provided through the use of telemedicine in my medical care

## 2020-01-18 NOTE — Patient Instructions (Signed)
Access Code: [email or web address] URL: [email or web address] Date: 01/18/2020 Prepared by: Hilda Blades  Exercises Seated Long Arc Quad - 3 x daily - 7 x weekly - 20 reps Seated Hip Abduction - 3 x daily - 7 x weekly - 20 reps Seated Heel Toe Raises - 3 x daily - 7 x weekly - 20 reps Seated Hip Adduction Isometrics with Ball - 3 x daily - 7 x weekly - 20 reps - 2 seconds hold Seated March - 3 x daily - 7 x weekly - 20 reps Sit to Stand with Armchair - 5-6 x daily - 7 x weekly - 5 reps

## 2020-01-18 NOTE — Therapy (Signed)
[place], [place], [address] Phone: [phone]   Fax:  [phone]  Physical Therapy Treatment  Patient Details  Name: [name] MRN: [id number] Date of Birth: 23-Jan-1956 Referring Provider (PT): Dr Karle Plumber    Encounter Date: 01/18/2020   PT End of Session - 01/18/20 1027    Visit Number 2    Number of Visits 12    Date for PT Re-Evaluation 02/19/20    Authorization Type Medicare    PT Start Time 1040    PT Stop Time 1125    PT Time Calculation (min) 45 min    Equipment Utilized During Treatment Gait belt    Activity Tolerance Patient limited by fatigue;Patient tolerated treatment well    Behavior During Therapy [facility] for tasks assessed/performed           Past Medical History:  Diagnosis Date  . Blind right eye   . Chronic back pain    "my whole back" (03/18/2015)  . Chronic chest pain    takes isosorbide  . Chronic diastolic heart failure ([place]) 9/13   echo 03/20/15 LV Ef of 60-65%, grade 2DD and PA peak pressure: 36 mm Hg   . CKD (chronic kidney disease), stage III   . Coronary artery disease cardiologist-- dr croitoru   per cardiac cath 02-07-2009 -- diffuse 3V CAD involving RCA, CFx, and D1,  pt not a candidate for bypass surgery or angioplasty,  medical management  . Diabetic neuropathy ([place])   . Diabetic retinopathy ([place])    bilateral proliferative  . Diastolic CHF, chronic ([place])   . Dyspnea    "always"  . Edema of both lower extremities   . Generalized weakness   . GERD (gastroesophageal reflux disease)   . Glaucoma, both eyes   . History of non-ST elevation myocardial infarction (NSTEMI)    02-25-2009;  05-15-2009;  01-08-2010;  04-04-2012 this MI was post-op surgery for abscess on 04-03-2012  . History of sepsis   . Hyperlipemia   . Hypertension   . Insulin dependent type 2 diabetes mellitus ([place])    followed by pcp  . Iron deficiency anemia due to chronic blood  loss    uterine bleeding  . Legally blind in left eye, as defined in [place]    per pt states vision "is foggy"  . Mild obstructive sleep apnea    study in epic 04-19-2012 mild osa,  recommendation's were wt. loss, dental  appliance, surgery, or cpap  . Morbid obesity with BMI of 40.0-44.9, adult ([place])   . OA (osteoarthritis)    knees  . PSVT (paroxysmal supraventricular tachycardia) ([place])   . Renal insufficiency    pt. denies  . Seasonal allergies   . Wears glasses     Past Surgical History:  Procedure Laterality Date  . BIOPSY  02/13/2018   Procedure: BIOPSY;  Surgeon: Ladene Artist, MD;  Location: [facility];  Service: Endoscopy;;  . BREAST SURGERY Right 2019    breast biopsy,  per pt benign  . CARDIAC CATHETERIZATION  02-27-2009   dr al little   diffuse 3V CAD , involving RCA, CFx, and D1;  inferior wall abnormalities, low normal ef 50%  . CATARACT EXTRACTION Right   . CATARACT EXTRACTION W/ INTRAOCULAR LENS IMPLANT Left 10-11-2007   @[facility]   . COLONOSCOPY WITH PROPOFOL N/A 02/13/2018   Procedure: COLONOSCOPY WITH PROPOFOL;  Surgeon: Ladene Artist, MD;  Location: [facility];  Service: Endoscopy;  Laterality:  N/A;  . DILATION AND CURETTAGE OF UTERUS  x2 last one 2000  . EYE SURGERY Bilateral 2009 to 2010   x2  left eye;  x2  right eye   . HYSTEROSCOPY WITH D & C N/A 10/11/2018   Procedure: DILATATION AND CURETTAGE /HYSTEROSCOPY;  Surgeon: [name], MD;  Location: [facility];  Service: Gynecology;  Laterality: N/A;  . INCISION AND DRAINAGE PERIRECTAL ABSCESS  04/03/2012  . INTRAUTERINE DEVICE (IUD) INSERTION  10/11/2018   Procedure: INTRAUTERINE DEVICE (IUD) INSERTION;  Surgeon: [name], MD;  Location: [facility];  Service: Gynecology;;  . IR US GUIDE [place] LEFT  02/13/2018  . IR VENIPUNCTURE 38YRS/OLDER BY MD  02/13/2018  . POLYPECTOMY  02/13/2018   Procedure: POLYPECTOMY;  Surgeon: Ladene Artist, MD;  Location: [facility];  Service: Endoscopy;;    There were no  vitals filed for this visit.   Subjective Assessment - 01/18/20 1051    Subjective Patient reports she almost fell twice yesterday because she couldn't really see where she was going. She states that she doesn't have any energy.    Patient Stated Goals to have increased stability/ decrease her risk of falls.    Currently in Pain? No/denies              [facility] PT Assessment - 01/18/20 0001      Standardized Balance Assessment   Standardized Balance Assessment Berg Balance Test      Berg Balance Test   Sit to Stand Able to stand  independently using hands   patient used cane in right hand   Standing Unsupported Able to stand 2 minutes with supervision   paient used cane in right hand   Sitting with Back Unsupported but Feet Supported on Floor or Stool Able to sit safely and securely 2 minutes    Stand to Sit Sits independently, has uncontrolled descent    Transfers Able to transfer safely, definite need of hands   patient used cane in right hand   Standing Unsupported with Eyes Closed Needs help to keep from falling    Standing Unsupported with Feet Together Able to place feet together independently but unable to hold for 30 seconds   patient used cane in right hand   From Standing, Reach Forward with Outstretched Arm Loses balance while trying/requires external support    From Standing Position, Pick up Object from Floor Unable to try/needs assist to keep balance    From Standing Position, Turn to Look Behind Over each Shoulder Needs supervision when turning    Turn 360 Degrees Needs close supervision or verbal cueing    Standing Unsupported, Alternately Place Feet on Step/Stool Needs assistance to keep from falling or unable to try    Standing Unsupported, One Foot in Front Loses balance while stepping or standing    Standing on One Leg Unable to try or needs assist to prevent fall    Total Score 18                         [facility] Adult PT Treatment/Exercise - 01/18/20  0001      Exercises   Exercises Knee/Hip      Knee/Hip Exercises: Aerobic   Nustep L4 x 5 min (LE only)      Knee/Hip Exercises: Standing   Gait Training Lowered patient's cane to appropriate height and worked on proper sequence with cane      Knee/Hip Exercises: Seated  Long [employer] 20 reps    Long Arc Quad Limitations full ROM    Heel Slides 20 reps    Heel Slides Limitations heel-toe raises    Ball Squeeze x20 with 2 sec hold    Clamshell with TheraBand Yellow   x20   Marching 20 reps    Sit to [employer] 5 reps;with UE support                  PT Education - 01/18/20 1027    Education Details HEP, balance, cane adjustment    Person(s) Educated Patient    Methods Explanation;Demonstration;Tactile cues;Verbal cues;Handout    Comprehension Verbalized understanding;Returned demonstration;Verbal cues required;Tactile cues required;Need further instruction            PT Short Term Goals - 01/07/20 1541      PT SHORT TERM GOAL #1   Title Patient will increase gross bilateral LE strength to 4+/5    Time 3    Period Weeks    Status New    Target Date 01/28/20      PT SHORT TERM GOAL #2   Title Patient will transfer sit to stand 3x without increased low back pain.    Time 3    Period Weeks    Status New    Target Date 01/28/20      PT SHORT TERM GOAL #3   Title Patient will ambualte 72' with single point cane without increased low back pain    Time 3    Period Weeks    Status New    Target Date 01/28/20             PT Long Term Goals - 01/07/20 1600      PT LONG TERM GOAL #1   Title Patient will ambualte 200' without increased pain in order to walk in her house    Time 6    Period Months    Status New    Target Date 02/18/20      PT LONG TERM GOAL #2   Title Patient will report no falls for 3 weeks    Time 6    Period Weeks    Status New    Target Date 02/18/20      PT LONG TERM GOAL #3   Title Patient will bend to pick object off the floor  without falling    Time 6    Period Weeks    Status New    Target Date 02/18/20                 Plan - 01/18/20 1028    Clinical Impression Statement Patient tolerated therapy well with no adverse effects. Performed a BERG balance assessment this visit and patient demonstrates a signficantly high fall risk. She was encouraged to continue using cane for all mobility and worked on proper technique with cane in a lower position to improve stability and control. Progressed HEP with seated exercises and encouraged patient to perform throughout the day. She would benefit from continued skilled PT to progress her strength and balance to reduce fall risk and improve safety with mobility.    PT Treatment/Interventions ADLs/Self Care Home Management;Cryotherapy;Aquatic Therapy;Electrical Stimulation;Iontophoresis 4mg /ml Dexamethasone;Traction;Ultrasound;Gait training;Stair training;Therapeutic activities;Functional mobility training;Therapeutic exercise;Balance training;Neuromuscular re-education;Patient/family education;Manual techniques;Passive range of motion;Taping    PT Next Visit Plan NuStep, progress seated strengthening and progress to standing as able, sit<>stand, standing balance at counter, incorporate UE exercises    PT Home Exercise Plan [id number]: LAQ,  seated marching, seated clamshell with yellow, seated heel-toe raises, seated pillow squeeze, sit<>stand with UE support    Consulted and Agree with Plan of Care Patient           Patient will benefit from skilled therapeutic intervention in order to improve the following deficits and impairments:  Abnormal gait, Decreased strength, Decreased balance, Decreased activity tolerance, Difficulty walking  Visit Diagnosis: Other abnormalities of gait and mobility  Muscle weakness (generalized)     Problem List Patient Active Problem List   Diagnosis Date Noted  . Functional urinary incontinence 09/07/2019  . Urge incontinence of  urine 09/07/2019  . Legally blind in right eye, as defined in [place] 08/11/2018  . Bilateral carpal tunnel syndrome 04/13/2018  . Adenomatous polyp of colon 04/13/2018  . Fibrocystic breast changes, right 12/12/2017  . Drug-induced constipation 09/15/2017  . Primary osteoarthritis of both knees 04/05/2017  . Chronic bilateral low back pain 03/07/2017  . Chronic pain of right knee 03/07/2017  . Vitamin B12 deficiency 12/09/2016  . Incidental lung nodule, > 67mm and < 41mm 09/14/2016  . Vitreous hemorrhage of right eye ([place])   . Diabetic gastroparesis associated with type 2 diabetes mellitus ([place]) 09/12/2015  . Controlled type 2 diabetes mellitus with complication, with long-term current use of insulin ([place]) 09/12/2015  . Chronic renal insufficiency, stage III (moderate) 03/19/2015  . Prolonged Q-T interval on ECG 08/10/2013  . Dyslipidemia 04/02/2013  . Chronic diastolic heart failure, NYHA class 3 ([place]) 04/07/2012  . Presumed OSA (obstructive sleep apnea) 04/06/2012  . NSTEMI  post-op 04/04/12-medical Rx 04/04/2012  . Severe obesity (BMI >= 40) ([place]) 01/17/2012  . GERD 08/21/2009  . Complex endometrial hyperplasia with atypia 03/20/2009  . PERIPHERAL EDEMA 03/20/2009  . Coronary atherosclerosis-not a surgical or PCI candidate 2010 02/27/2009  . Proliferative diabetic retinopathy ([place]) 10/07/2007  . DETACHED RETINA, BILATERAL, HX OF 09/07/2007  . History of chronic Iron defeicency anemia with acute post op anemia, transfused after debridment 04/04/12 04/17/2007  . Essential hypertension 04/17/2007    Hilda Blades, PT, DPT, LAT, ATC 01/18/20  11:40 AM Phone: [phone] Fax: [facility] [address] [place], [place], [address] Phone: [phone]   Fax:  [phone]  Name: [name] MRN: [id number] Date of Birth: [birth year]

## 2020-01-22 ENCOUNTER — Telehealth: Payer: Self-pay | Admitting: *Deleted

## 2020-01-22 NOTE — Telephone Encounter (Signed)
The patient has been made aware that a blood pressure cuff is available for her to pick up at her convenience.

## 2020-01-23 ENCOUNTER — Emergency Department (HOSPITAL_COMMUNITY): Payer: Medicare Other

## 2020-01-23 ENCOUNTER — Other Ambulatory Visit: Payer: Self-pay

## 2020-01-23 ENCOUNTER — Inpatient Hospital Stay (HOSPITAL_COMMUNITY)
Admission: AD | Admit: 2020-01-23 | Discharge: 2020-01-26 | DRG: 291 | Disposition: A | Discharge status: Home / Self Care | Payer: Medicare Other | Attending: Internal Medicine | Admitting: Internal Medicine

## 2020-01-23 ENCOUNTER — Ambulatory Visit (HOSPITAL_COMMUNITY)
Admission: EM | Admit: 2020-01-23 | Discharge: 2020-01-23 | Disposition: A | Discharge status: Home / Self Care | Payer: Medicare Other | Source: Home / Self Care

## 2020-01-23 ENCOUNTER — Encounter: Payer: Self-pay | Admitting: Physical Therapy

## 2020-01-23 ENCOUNTER — Encounter (HOSPITAL_COMMUNITY): Payer: Self-pay

## 2020-01-23 ENCOUNTER — Ambulatory Visit: Payer: Medicare Other | Admitting: Physical Therapy

## 2020-01-23 ENCOUNTER — Encounter (HOSPITAL_COMMUNITY): Payer: Self-pay | Admitting: Emergency Medicine

## 2020-01-23 DIAGNOSIS — I25119 Atherosclerotic heart disease of native coronary artery with unspecified angina pectoris: Secondary | ICD-10-CM | POA: Diagnosis present

## 2020-01-23 DIAGNOSIS — I25118 Atherosclerotic heart disease of native coronary artery with other forms of angina pectoris: Secondary | ICD-10-CM

## 2020-01-23 DIAGNOSIS — Z7982 Long term (current) use of aspirin: Secondary | ICD-10-CM

## 2020-01-23 DIAGNOSIS — I5033 Acute on chronic diastolic (congestive) heart failure: Secondary | ICD-10-CM | POA: Diagnosis not present

## 2020-01-23 DIAGNOSIS — R911 Solitary pulmonary nodule: Secondary | ICD-10-CM | POA: Diagnosis not present

## 2020-01-23 DIAGNOSIS — I130 Hypertensive heart and chronic kidney disease with heart failure and stage 1 through stage 4 chronic kidney disease, or unspecified chronic kidney disease: Secondary | ICD-10-CM | POA: Diagnosis not present

## 2020-01-23 DIAGNOSIS — E1165 Type 2 diabetes mellitus with hyperglycemia: Secondary | ICD-10-CM | POA: Diagnosis present

## 2020-01-23 DIAGNOSIS — I10 Essential (primary) hypertension: Secondary | ICD-10-CM | POA: Diagnosis present

## 2020-01-23 DIAGNOSIS — I493 Ventricular premature depolarization: Secondary | ICD-10-CM | POA: Diagnosis not present

## 2020-01-23 DIAGNOSIS — I4510 Unspecified right bundle-branch block: Secondary | ICD-10-CM | POA: Diagnosis present

## 2020-01-23 DIAGNOSIS — I5023 Acute on chronic systolic (congestive) heart failure: Secondary | ICD-10-CM | POA: Diagnosis not present

## 2020-01-23 DIAGNOSIS — I252 Old myocardial infarction: Secondary | ICD-10-CM

## 2020-01-23 DIAGNOSIS — N183 Chronic kidney disease, stage 3 unspecified: Secondary | ICD-10-CM | POA: Diagnosis present

## 2020-01-23 DIAGNOSIS — E11319 Type 2 diabetes mellitus with unspecified diabetic retinopathy without macular edema: Secondary | ICD-10-CM | POA: Diagnosis present

## 2020-01-23 DIAGNOSIS — I509 Heart failure, unspecified: Secondary | ICD-10-CM | POA: Insufficient documentation

## 2020-01-23 DIAGNOSIS — K219 Gastro-esophageal reflux disease without esophagitis: Secondary | ICD-10-CM | POA: Diagnosis not present

## 2020-01-23 DIAGNOSIS — E1140 Type 2 diabetes mellitus with diabetic neuropathy, unspecified: Secondary | ICD-10-CM | POA: Diagnosis present

## 2020-01-23 DIAGNOSIS — Z8371 Family history of colonic polyps: Secondary | ICD-10-CM

## 2020-01-23 DIAGNOSIS — Z20822 Contact with and (suspected) exposure to covid-19: Secondary | ICD-10-CM | POA: Diagnosis not present

## 2020-01-23 DIAGNOSIS — M549 Dorsalgia, unspecified: Secondary | ICD-10-CM | POA: Diagnosis not present

## 2020-01-23 DIAGNOSIS — I5032 Chronic diastolic (congestive) heart failure: Secondary | ICD-10-CM | POA: Diagnosis not present

## 2020-01-23 DIAGNOSIS — Z803 Family history of malignant neoplasm of breast: Secondary | ICD-10-CM | POA: Diagnosis not present

## 2020-01-23 DIAGNOSIS — E1122 Type 2 diabetes mellitus with diabetic chronic kidney disease: Secondary | ICD-10-CM | POA: Diagnosis present

## 2020-01-23 DIAGNOSIS — G8929 Other chronic pain: Secondary | ICD-10-CM | POA: Diagnosis not present

## 2020-01-23 DIAGNOSIS — R079 Chest pain, unspecified: Secondary | ICD-10-CM | POA: Diagnosis not present

## 2020-01-23 DIAGNOSIS — Z8249 Family history of ischemic heart disease and other diseases of the circulatory system: Secondary | ICD-10-CM

## 2020-01-23 DIAGNOSIS — Z794 Long term (current) use of insulin: Secondary | ICD-10-CM

## 2020-01-23 DIAGNOSIS — E785 Hyperlipidemia, unspecified: Secondary | ICD-10-CM | POA: Diagnosis present

## 2020-01-23 DIAGNOSIS — R9431 Abnormal electrocardiogram [ECG] [EKG]: Secondary | ICD-10-CM | POA: Diagnosis present

## 2020-01-23 DIAGNOSIS — H409 Unspecified glaucoma: Secondary | ICD-10-CM | POA: Diagnosis present

## 2020-01-23 DIAGNOSIS — R0789 Other chest pain: Secondary | ICD-10-CM | POA: Diagnosis not present

## 2020-01-23 DIAGNOSIS — I208 Other forms of angina pectoris: Secondary | ICD-10-CM | POA: Diagnosis not present

## 2020-01-23 DIAGNOSIS — R0602 Shortness of breath: Secondary | ICD-10-CM | POA: Diagnosis not present

## 2020-01-23 DIAGNOSIS — Z833 Family history of diabetes mellitus: Secondary | ICD-10-CM

## 2020-01-23 DIAGNOSIS — R2689 Other abnormalities of gait and mobility: Secondary | ICD-10-CM

## 2020-01-23 DIAGNOSIS — H548 Legal blindness, as defined in USA: Secondary | ICD-10-CM | POA: Diagnosis not present

## 2020-01-23 DIAGNOSIS — J9601 Acute respiratory failure with hypoxia: Secondary | ICD-10-CM | POA: Diagnosis present

## 2020-01-23 DIAGNOSIS — Z823 Family history of stroke: Secondary | ICD-10-CM

## 2020-01-23 DIAGNOSIS — N1831 Chronic kidney disease, stage 3a: Secondary | ICD-10-CM | POA: Diagnosis not present

## 2020-01-23 DIAGNOSIS — Z6841 Body Mass Index (BMI) 40.0 and over, adult: Secondary | ICD-10-CM | POA: Diagnosis not present

## 2020-01-23 DIAGNOSIS — M6281 Muscle weakness (generalized): Secondary | ICD-10-CM

## 2020-01-23 DIAGNOSIS — G4733 Obstructive sleep apnea (adult) (pediatric): Secondary | ICD-10-CM | POA: Diagnosis present

## 2020-01-23 DIAGNOSIS — Z79899 Other long term (current) drug therapy: Secondary | ICD-10-CM

## 2020-01-23 DIAGNOSIS — I248 Other forms of acute ischemic heart disease: Secondary | ICD-10-CM | POA: Diagnosis not present

## 2020-01-23 LAB — BASIC METABOLIC PANEL
Anion gap: 10 (ref 5–15)
BUN: 10 mg/dL (ref 8–23)
CO2: 21 mmol/L — ABNORMAL LOW (ref 22–32)
Calcium: 9 mg/dL (ref 8.9–10.3)
Chloride: 108 mmol/L (ref 98–111)
Creatinine, Ser: 1.16 mg/dL — ABNORMAL HIGH (ref 0.44–1.00)
GFR calc Af Amer: 57 mL/min — ABNORMAL LOW (ref >60)
GFR calc non Af Amer: 49 mL/min — ABNORMAL LOW (ref >60)
Glucose, Bld: 266 mg/dL — ABNORMAL HIGH (ref 70–99)
Potassium: 4.2 mmol/L (ref 3.5–5.1)
Sodium: 139 mmol/L (ref 135–145)

## 2020-01-23 LAB — TROPONIN I (HIGH SENSITIVITY)
Troponin I (High Sensitivity): 6 ng/L (ref <18)
Troponin I (High Sensitivity): 26 ng/L — ABNORMAL HIGH (ref <18)

## 2020-01-23 LAB — CBC
HCT: 38.8 % (ref 36.0–46.0)
Hemoglobin: 12.5 g/dL (ref 12.0–15.0)
MCH: 31 pg (ref 26.0–34.0)
MCHC: 32.2 g/dL (ref 30.0–36.0)
MCV: 96.3 fL (ref 80.0–100.0)
Platelets: 241 10*3/uL (ref 150–400)
RBC: 4.03 MIL/uL (ref 3.87–5.11)
RDW: 13.8 % (ref 11.5–15.5)
WBC: 11.2 10*3/uL — ABNORMAL HIGH (ref 4.0–10.5)
nRBC: 0 % (ref 0.0–0.2)

## 2020-01-23 LAB — SARS CORONAVIRUS 2 BY RT PCR (HOSPITAL ORDER, PERFORMED IN ~~LOC~~ HOSPITAL LAB): SARS Coronavirus 2: NEGATIVE

## 2020-01-23 MED ORDER — ENOXAPARIN SODIUM 40 MG/0.4ML ~~LOC~~ SOLN
40.0000 mg | SUBCUTANEOUS | Status: DC
  Administered 2020-01-23 – 2020-01-25 (×3): 40 mg via SUBCUTANEOUS
  Filled 2020-01-23 (×3): qty 0.4

## 2020-01-23 MED ORDER — DILTIAZEM HCL ER COATED BEADS 240 MG PO CP24
240.0000 mg | ORAL_CAPSULE | Freq: Every day | ORAL | Status: DC
  Administered 2020-01-23 – 2020-01-26 (×4): 240 mg via ORAL
  Filled 2020-01-23 (×2): qty 2
  Filled 2020-01-23 (×3): qty 1
  Filled 2020-01-23: qty 2
  Filled 2020-01-23: qty 1

## 2020-01-23 MED ORDER — TIMOLOL MALEATE 0.5 % OP SOLN
1.0000 [drp] | Freq: Two times a day (BID) | OPHTHALMIC | Status: DC
  Administered 2020-01-23 – 2020-01-25 (×3): 1 [drp] via OPHTHALMIC
  Filled 2020-01-23: qty 5

## 2020-01-23 MED ORDER — SODIUM CHLORIDE 0.9% FLUSH
3.0000 mL | Freq: Once | INTRAVENOUS | Status: AC
  Administered 2020-01-23: 3 mL via INTRAVENOUS

## 2020-01-23 MED ORDER — ATORVASTATIN CALCIUM 80 MG PO TABS
80.0000 mg | ORAL_TABLET | Freq: Every evening | ORAL | Status: DC
  Administered 2020-01-23 – 2020-01-25 (×3): 80 mg via ORAL
  Filled 2020-01-23 (×3): qty 1

## 2020-01-23 MED ORDER — ACETAMINOPHEN 500 MG PO TABS
1000.0000 mg | ORAL_TABLET | Freq: Once | ORAL | Status: AC
  Administered 2020-01-23: 1000 mg via ORAL
  Filled 2020-01-23: qty 2

## 2020-01-23 MED ORDER — BRIMONIDINE TARTRATE 0.2 % OP SOLN
1.0000 [drp] | Freq: Two times a day (BID) | OPHTHALMIC | Status: DC
  Administered 2020-01-23 – 2020-01-26 (×3): 1 [drp] via OPHTHALMIC
  Filled 2020-01-23: qty 5

## 2020-01-23 MED ORDER — BISMUTH SUBSALICYLATE 262 MG/15ML PO SUSP
30.0000 mL | Freq: Four times a day (QID) | ORAL | Status: DC | PRN
  Administered 2020-01-25: 30 mL via ORAL
  Filled 2020-01-23: qty 236

## 2020-01-23 MED ORDER — FUROSEMIDE 20 MG PO TABS: 40.0000 mg | ORAL_TABLET | Freq: Two times a day (BID) | ORAL | Status: DC

## 2020-01-23 MED ORDER — ACETAMINOPHEN 500 MG PO TABS
500.0000 mg | ORAL_TABLET | Freq: Four times a day (QID) | ORAL | Status: DC | PRN
  Administered 2020-01-25 – 2020-01-26 (×2): 500 mg via ORAL
  Filled 2020-01-23 (×2): qty 1

## 2020-01-23 MED ORDER — INSULIN ASPART 100 UNIT/ML ~~LOC~~ SOLN
0.0000 [IU] | SUBCUTANEOUS | Status: DC
  Administered 2020-01-23: 11 [IU] via SUBCUTANEOUS
  Administered 2020-01-24 (×3): 5 [IU] via SUBCUTANEOUS

## 2020-01-23 MED ORDER — ASPIRIN EC 81 MG PO TBEC
81.0000 mg | DELAYED_RELEASE_TABLET | Freq: Every day | ORAL | Status: DC
  Administered 2020-01-24 – 2020-01-26 (×3): 81 mg via ORAL
  Filled 2020-01-23 (×3): qty 1

## 2020-01-23 MED ORDER — CALCIUM CARBONATE ANTACID 500 MG PO CHEW: 1.0000 | CHEWABLE_TABLET | Freq: Three times a day (TID) | ORAL | Status: DC | PRN

## 2020-01-23 MED ORDER — CYANOCOBALAMIN 500 MCG PO TABS
1000.0000 ug | ORAL_TABLET | Freq: Every day | ORAL | Status: DC
  Administered 2020-01-24 – 2020-01-26 (×3): 1000 ug via ORAL
  Filled 2020-01-23 (×3): qty 2

## 2020-01-23 MED ORDER — BRIMONIDINE TARTRATE-TIMOLOL 0.2-0.5 % OP SOLN: 1.0000 [drp] | Freq: Two times a day (BID) | OPHTHALMIC | Status: DC

## 2020-01-23 MED ORDER — ACETAZOLAMIDE ER 500 MG PO CP12
500.0000 mg | ORAL_CAPSULE | Freq: Two times a day (BID) | ORAL | Status: DC
  Administered 2020-01-23 – 2020-01-26 (×6): 500 mg via ORAL
  Filled 2020-01-23 (×9): qty 1

## 2020-01-23 MED ORDER — ASPIRIN 81 MG PO CHEW
324.0000 mg | CHEWABLE_TABLET | Freq: Once | ORAL | Status: AC
  Administered 2020-01-23: 324 mg via ORAL
  Filled 2020-01-23: qty 4

## 2020-01-23 MED ORDER — FUROSEMIDE 10 MG/ML IJ SOLN
80.0000 mg | Freq: Two times a day (BID) | INTRAMUSCULAR | Status: DC
  Administered 2020-01-24 – 2020-01-26 (×5): 80 mg via INTRAVENOUS
  Filled 2020-01-23 (×5): qty 8

## 2020-01-23 MED ORDER — ISOSORBIDE MONONITRATE ER 60 MG PO TB24
120.0000 mg | ORAL_TABLET | Freq: Every day | ORAL | Status: DC
  Administered 2020-01-24 – 2020-01-26 (×3): 120 mg via ORAL
  Filled 2020-01-23 (×3): qty 2

## 2020-01-23 MED ORDER — INSULIN ASPART PROT & ASPART (70-30 MIX) 100 UNIT/ML ~~LOC~~ SUSP
40.0000 [IU] | Freq: Two times a day (BID) | SUBCUTANEOUS | Status: DC
  Administered 2020-01-24 – 2020-01-26 (×5): 40 [IU] via SUBCUTANEOUS
  Filled 2020-01-23: qty 10

## 2020-01-23 MED ORDER — METOPROLOL SUCCINATE ER 100 MG PO TB24
200.0000 mg | ORAL_TABLET | Freq: Every day | ORAL | Status: DC
  Administered 2020-01-23 – 2020-01-26 (×4): 200 mg via ORAL
  Filled 2020-01-23 (×4): qty 2

## 2020-01-23 MED ORDER — LATANOPROST 0.005 % OP SOLN
1.0000 [drp] | Freq: Every day | OPHTHALMIC | Status: DC
  Administered 2020-01-23 – 2020-01-25 (×3): 1 [drp] via OPHTHALMIC
  Filled 2020-01-23: qty 2.5

## 2020-01-23 MED ORDER — LOSARTAN POTASSIUM 50 MG PO TABS
100.0000 mg | ORAL_TABLET | Freq: Every day | ORAL | Status: DC
  Administered 2020-01-23 – 2020-01-26 (×4): 100 mg via ORAL
  Filled 2020-01-23 (×4): qty 2

## 2020-01-23 NOTE — H&P (Signed)
[facility] History and Physical  CHAMEKA MCMULLEN SWN:[id number] DOB: Apr 15, 1956 DOA: 01/23/2020  Referring EDP: Sedonia Small PCP: Ladell Pier, MD   Chief Complaint: Chest pain  HPI: [name] is a 64 y.o. female with PMH of HTN, CAD, CKD, T2DM, HLD and obesity presents to ED with chest pain and admitted for ACS rule-out.  Patient reports chest pain since Sunday and mostly constant but relieved by rest, worsened by movement and sometimes by eating. Reports SOB this week as well. She is unsure if weight has changed. She has noted some swelling in ankles, L>R. Reports poor PO intake over the last few days due to chest discomfort. Pain is located in center of chest and radiates to back and both shoulders. Reports she has not take medications today but that normally she does. Denies tobacco or alcohol use history. Sometimes she feels pain in her chest and feels like "her head is going to explode" and feels this is related to high blood pressure. Reports some abdominal cramping related to gyn issues; vaginal bleeding has improved. Denies headache, dizziness, fever, chills, cough, nausea, vomiting, diarrhea, constipation, dysuria, hematuria, hematochezia, melena, difficulty moving arms/legs, speech difficulty, trouble eating, confusion or any other complaints.  In the ED: Hypertensive otherwise vitals stable on room air. Labs remarkable for glucose 266, BMP at baseline, CBC with WBC 11.2 otherwise WNL. Trop 6>26. CXR: non-acute  EDP consulted Cardiology who saw patient and would like to observe overnight and will follow. Patient was given ASA 324 mg in ED.   Review of Systems:  All other systems negative unless noted above in HPI.   Past Medical History:  Diagnosis Date  . Blind right eye   . Chronic back pain    "my whole back" (03/18/2015)  . Chronic chest pain    takes isosorbide  . Chronic diastolic heart failure ([place]) 9/13   echo 03/20/15 LV Ef of 60-65%, grade 2DD and PA  peak pressure: 36 mm Hg   . CKD (chronic kidney disease), stage III   . Coronary artery disease cardiologist-- dr croitoru   per cardiac cath 02-07-2009 -- diffuse 3V CAD involving RCA, CFx, and D1,  pt not a candidate for bypass surgery or angioplasty,  medical management  . Diabetic neuropathy ([place])   . Diabetic retinopathy ([place])    bilateral proliferative  . Diastolic CHF, chronic ([place])   . Dyspnea    "always"  . Edema of both lower extremities   . Generalized weakness   . GERD (gastroesophageal reflux disease)   . Glaucoma, both eyes   . History of non-ST elevation myocardial infarction (NSTEMI)    02-25-2009;  05-15-2009;  01-08-2010;  04-04-2012 this MI was post-op surgery for abscess on 04-03-2012  . History of sepsis   . Hyperlipemia   . Hypertension   . Insulin dependent type 2 diabetes mellitus ([place])    followed by pcp  . Iron deficiency anemia due to chronic blood loss    uterine bleeding  . Legally blind in left eye, as defined in [place]    per pt states vision "is foggy"  . Mild obstructive sleep apnea    study in epic 04-19-2012 mild osa,  recommendation's were wt. loss, dental  appliance, surgery, or cpap  . Morbid obesity with BMI of 40.0-44.9, adult ([place])   . OA (osteoarthritis)    knees  . PSVT (paroxysmal supraventricular tachycardia) ([place])   . Renal insufficiency    pt. denies  . Seasonal allergies   .  Wears glasses    Past Surgical History:  Procedure Laterality Date  . BIOPSY  02/13/2018   Procedure: BIOPSY;  Surgeon: Ladene Artist, MD;  Location: [facility];  Service: Endoscopy;;  . BREAST SURGERY Right 2019    breast biopsy,  per pt benign  . CARDIAC CATHETERIZATION  02-27-2009   dr al little   diffuse 3V CAD , involving RCA, CFx, and D1;  inferior wall abnormalities, low normal ef 50%  . CATARACT EXTRACTION Right   . CATARACT EXTRACTION W/ INTRAOCULAR LENS IMPLANT Left 10-11-2007   _0   . COLONOSCOPY WITH PROPOFOL N/A 02/13/2018   Procedure:  COLONOSCOPY WITH PROPOFOL;  Surgeon: Ladene Artist, MD;  Location: [facility];  Service: Endoscopy;  Laterality: N/A;  . DILATION AND CURETTAGE OF UTERUS  x2 last one 2000  . EYE SURGERY Bilateral 2009 to 2010   x2  left eye;  x2  right eye   . HYSTEROSCOPY WITH D & C N/A 10/11/2018   Procedure: DILATATION AND CURETTAGE /HYSTEROSCOPY;  Surgeon: [name], MD;  Location: [facility];  Service: Gynecology;  Laterality: N/A;  . INCISION AND DRAINAGE PERIRECTAL ABSCESS  04/03/2012  . INTRAUTERINE DEVICE (IUD) INSERTION  10/11/2018   Procedure: INTRAUTERINE DEVICE (IUD) INSERTION;  Surgeon: [name], MD;  Location: [facility];  Service: Gynecology;;  . IR US GUIDE [place] LEFT  02/13/2018  . IR VENIPUNCTURE 16YRS/OLDER BY MD  02/13/2018  . POLYPECTOMY  02/13/2018   Procedure: POLYPECTOMY;  Surgeon: Ladene Artist, MD;  Location: [facility];  Service: Endoscopy;;   Social History:  reports that she has never smoked. She has never used smokeless tobacco. She reports that she does not drink alcohol and does not use drugs.  Allergies  Allergen Reactions  . Ativan [Lorazepam] Anaphylaxis  . Orange Fruit [Citrus] Anaphylaxis and Other (See Comments)    Pt stated throat swelling, itching    Family History  Problem Relation Age of Onset  . Diabetes Mother   . Heart disease Mother   . Heart attack Mother   . Hypertension Mother   . Breast cancer Mother   . Colon polyps Father   . Diabetes Father   . Heart disease Father   . Heart attack Father   . Stroke Father   . Hypertension Father   . Diabetes Brother   . Diabetes Sister   . Heart disease Brother   . Heart disease Sister   . Diabetes Maternal Grandmother   . Hypertension Maternal Grandmother   . Hypertension Brother   . Hypertension Sister   . Breast cancer Cousin   . Colon cancer Cousin   . Esophageal cancer Neg Hx   . Rectal cancer Neg Hx   . Stomach cancer Neg Hx     Prior to Admission medications   Medication Sig  Start Date End Date Taking? Authorizing Provider  Accu-Chek Softclix Lancets lancets Use as instructed for 3 times daily blood glucose monitoring 12/24/19   Ladell Pier, MD  acetaminophen (TYLENOL) 500 MG tablet Take 1 tablet (500 mg total) by mouth every 6 (six) hours as needed. Patient taking differently: Take 500 mg by mouth every 6 (six) hours as needed (pain.).  09/09/16   Clent Demark, PA-C  acetaZOLAMIDE (DIAMOX) 500 MG capsule Take 500 mg by mouth 2 (two) times daily.    [provider]  aspirin EC 81 MG tablet Take 81 mg by mouth daily.    [provider]  atorvastatin (LIPITOR) 80 MG tablet TAKE 1 TABLET BY MOUTH ONCE DAILY IN THE EVENING AT 6  PM 01/22/20   Ladell Pier, MD  bismuth subsalicylate (PEPTO BISMOL) 262 MG/15ML suspension Take 30 mLs by mouth every 6 (six) hours as needed for indigestion.    [provider]  Blood Glucose Monitoring Suppl (ACCU-CHEK AVIVA PLUS) w/Device KIT Used as directed 09/29/18   Ladell Pier, MD  Blood Pressure Monitoring (BLOOD PRESSURE MONITOR/ARM) DEVI 1 each by Does not apply route daily. ICD 10, I10 and I50.32 03/07/17   Funches, Josalyn, MD  brimonidine-timolol (COMBIGAN) 0.2-0.5 % ophthalmic solution Place 1 drop into both eyes 2 (two) times daily.     [provider]  calcium carbonate (TUMS - DOSED IN MG ELEMENTAL CALCIUM) 500 MG chewable tablet Chew 1 tablet by mouth 3 (three) times daily as needed for indigestion.     [provider]  Cyanocobalamin (VITAMIN B-12) 1000 MCG TABS Take 1,000 mcg by mouth daily. 09/15/17   Ladell Pier, MD  diltiazem (DILACOR XR) 240 MG 24 hr capsule Take 1 capsule (240 mg total) by mouth daily. 09/15/17   Ladell Pier, MD  ferrous sulfate 325 (65 FE) MG tablet Take 325 mg by mouth daily with breakfast.    [provider]  furosemide (LASIX) 40 MG tablet TAKE 1 TABLET BY MOUTH  TWICE DAILY (TAKE WITH  FUROSEMIDE 80 MG TAB) 01/16/20    Ladell Pier, MD  furosemide (LASIX) 80 MG tablet TAKE 1 TABLET BY MOUTH TWO  TIMES DAILY WITH FUROSEMIDE 40MG  (TOTAL DOSE OF 120MG  TWO TIMES DAILY ) 01/16/20   Ladell Pier, MD  glucose blood test strip Use TID before meals / Dx E11.9 09/29/18   Ladell Pier, MD  glucose monitoring kit (FREESTYLE) monitoring kit 1 each by Does not apply route 4 (four) times daily - after meals and at bedtime. 09/12/15   Funches, Adriana Mccallum, MD  insulin lispro protamine-lispro (HUMALOG MIX 75/25) (75-25) 100 UNIT/ML SUSP injection INJECT SUBCUTANEOUSLY 54  UNITS TWICE DAILY WITH  MEALS. Please make PCP appointment. 12/24/19   Ladell Pier, MD  isosorbide mononitrate (IMDUR) 120 MG 24 hr tablet TAKE 1 TABLET BY MOUTH ONCE DAILY 11/19/19   Ladell Pier, MD  losartan (COZAAR) 50 MG tablet Take 2 tablets by mouth once daily 04/04/19   Ladell Pier, MD  LUMIGAN 0.01 % SOLN Place 1 drop into the right eye at bedtime.  03/06/14   [provider]  megestrol (MEGACE) 40 MG tablet Take 1 tablet (40 mg total) by mouth 2 (two) times daily. 08/27/19   [name], MD  Menthol-Methyl Salicylate (MUSCLE RUB EX) Apply 1 application topically daily as needed (knee pain).     [provider]  metoCLOPramide (REGLAN) 5 MG tablet TAKE 1 TABLET BY MOUTH TWICE DAILY BEFORE THE TWO LARGEST MEALS OF THE DAY 02/26/19   Ladell Pier, MD  metoprolol (TOPROL-XL) 200 MG 24 hr tablet Take 1 tablet by mouth once daily 11/19/19   Croitoru, Mihai, MD  nitroGLYCERIN (NITROSTAT) 0.4 MG SL tablet Place 1 tablet (0.4 mg total) under the tongue every 5 (five) minutes as needed for chest pain. Max 3 doses in 15 minutes. <PLEASE MAKE APPOINTMENT FOR REFILLS> 11/23/19   Croitoru, Mihai, MD  pantoprazole (PROTONIX) 40 MG tablet TAKE ONE TABLET BY MOUTH ONCE DAILY Patient taking differently: Take 40 mg by mouth daily.  01/13/16   Funches,  Josalyn, MD  potassium chloride SA (KLOR-CON) 20 MEQ tablet TAKE 2 TABLETS  BY MOUTH IN  THE MORNING AND 1 TABLET IN THE EVENING 10/30/19   Croitoru, Mihai, MD  RELION INSULIN SYRINGE 31G X 15/64" 1 ML MISC USE AS DIRECTED TWICE DAILY INSULIN ADMINISTRATION 10/02/19   Ladell Pier, MD   Physical Exam: Vitals:   01/23/20 1236 01/23/20 1407 01/23/20 1714 01/23/20 1823  BP: (!) 184/84 137/68 (!) 161/86   Pulse: (!) 106 95 100   Resp: _0 Temp: 98.3 F (36.8 C) 98.5 F (36.9 C) 98.2 F (36.8 C)   TempSrc:  Oral Oral   SpO2: 98% 98% 100%     Wt Readings from Last 3 Encounters:  01/23/20 128.4 kg  12/24/19 129 kg  03/28/19 123.4 kg    . General:  Appears calm and comfortable. AAOx4. Obese.  . Eyes: EOMI, normal lids, irises & conjunctiva . ENT: grossly normal hearing, lips & tongue . Neck: normal ROM, JVD difficult to appreciate due to body habitus  . Cardiovascular: RRR, no m/r/g. Non-pitting edema of LE; L>R. Marland Kitchen Respiratory: CTA bilaterally, no w/r/r. Normal respiratory effort. . Abdomen: soft, ntnd . Skin: no rash or induration seen on limited exam . Musculoskeletal: grossly normal tone BUE/BLE . Psychiatric: grossly normal mood and affect, speech fluent and appropriate . Neurologic: grossly non-focal.          Labs on Admission:  Basic Metabolic Panel: Recent Labs  Lab 01/23/20 1239  NA 139  K 4.2  CL 108  CO2 21*  GLUCOSE 266*  BUN 10  CREATININE 1.16*  CALCIUM 9.0   Liver Function Tests: No results for input(s): AST, ALT, ALKPHOS, BILITOT, PROT, ALBUMIN in the last 168 hours. No results for input(s): LIPASE, AMYLASE in the last 168 hours. No results for input(s): AMMONIA in the last 168 hours. CBC: Recent Labs  Lab 01/23/20 1239  WBC 11.2*  HGB 12.5  HCT 38.8  MCV 96.3  PLT 241   Cardiac Enzymes: No results for input(s): CKTOTAL, CKMB, CKMBINDEX, TROPONINI in the last 168 hours.  BNP (last 3 results) No results for input(s): BNP in the last 8760 hours.  ProBNP (last 3 results) No results for input(s): PROBNP  in the last 8760 hours.  CBG: No results for input(s): GLUCAP in the last 168 hours.  Radiological Exams on Admission: DG Chest 2 View  Result Date: 01/23/2020 CLINICAL DATA:  Chest pressure that began yesterday. Additional history provided: Chest pressure and shortness of breath for 2 days. History of CKD, CAD, CHF, GERD, hypertension. EXAM: CHEST - 2 VIEW COMPARISON:  Prior chest CT 09/20/2016, chest radiograph 09/09/2016 FINDINGS: Heart size within normal limits. No appreciable airspace consolidation within the lungs. No frank pulmonary edema. No evidence of pleural effusion or pneumothorax. No acute bony abnormality identified. IMPRESSION: No evidence of acute cardiopulmonary abnormality. Please refer to prior chest CT 09/20/2016 for a description of previously demonstrated small pulmonary nodules and follow-up recommendations. Electronically Signed   By: Kellie Simmering DO   On: 01/23/2020 13:37    EKG: Independently reviewed. HR 105. Sinus rhythm. QTc 523. No STEMI. RBBB as seen on prior EKG from June 2020.   Assessment/Plan Principal Problem:   Chest pain Active Problems:   Essential hypertension   Severe obesity (BMI >= 40) (HCC)   Chronic diastolic heart failure, NYHA class 3 (HCC)   Dyslipidemia   Prolonged Q-T interval on ECG   Chronic renal insufficiency, stage  III (moderate)   Controlled type 2 diabetes mellitus with complication, with long-term current use of insulin (HCC)   Legally blind in right eye, as defined in [place]   Acute exacerbation of CHF (congestive heart failure) ([place])  64 y.o. female with PMH of HTN, CAD, CKD, T2DM, HLD and obesity presents to ED with chest pain and admitted for ACS rule-out.  Chest Pain CHF Exacerbation CAD - patient with tachycardia, SOB, LE edema and chest pain x 3-4 days - Per Cards: "Angina pectoris was previously well controlled on a combination of beta-blocker and high-dose long-acting nitrates. She has extensive CAD not amenable to the  surgical or percutaneous revascularization.  Doubt true acute coronary atherothrombotic event causing her symptoms.  Suspect that she is ischemia exacerbation uncontrolled hypertension" - Trend Trop - Tele - Check BNP - Daily weights; I's and O's - Lasix 80 mg IV BID for now - Cardiology following - Daily K and Mag - Repeat Echo; last Echo in 2017 - Repeat lipid profile   T2DM - A1c 10.0 in May 2021; previously 7.5 one year prior; questionable non-compliance? - Home regimen: 54 units Humalog BID with meals > will lower to 40 units BID with meals and sliding scale; escalate PRN - Could consider Farxiga per Cards; will monitor sugars inpatient prior to starting   HTN - has not taken any meds today but reports compliance otherwise - restart home meds and monitor BP's - already on max doses of Losartan and Metoprolol - Per Cards, could increase Diltiazem, add Hydralazine or Spironolactone, but will monitor inpatient first on home meds  - Goal: 130/80  CKD - at baseline; Cr 1.16 on admission - balance of heart failure treatment with renal dysfunction; daily BMP  HLD - cont statin; on max dose - repeat lipid panel   Prolonged QTc - monitor electrolytes - limit QTc-prolonging meds   Code Status: Full DVT Prophylaxis: Lovenox Family Communication: None Disposition Plan: Admit to inpatient. Patient is at high risk for further decompensation due to age and co-morbidities. Cardiology consulted; likely requiring 2-3 day inpatient for diuresis.    Time spent: 70 minutes  Chauncey Mann, MD [facility] Pager [phone]

## 2020-01-23 NOTE — ED Notes (Signed)
Dr Sedonia Small notified of trop of 79

## 2020-01-23 NOTE — Consult Note (Signed)
Cardiology Consultation:   Patient ID: EVELEEN MCNEAR MRN: [id number]; DOB: Feb 20, 1956  Admit date: 01/23/2020 Date of Consult: 01/23/2020  Primary Care Provider: Ladell Pier, MD [facility] Cardiologist: Sanda Klein, MD  [facility] Electrophysiologist:  None    Patient Profile:   [name] is a 64 y.o. female with a hx of CAD and HFpEF who is being seen today for the evaluation of accelerating angina at the request of Dr. Sedonia Small.  History of Present Illness:   Ms. Poudrier has had two unexpected episodes of angina at rest in the last 4 days.  Symptoms above 60 minutes.  She obtained relief after lying in bed and taking sublingual nitroglycerin, but it took 30-40 minutes for the chest discomfort.  This is the first time she's taken nitroglycerin in several months. Otherwise she has had a very stable pattern of occasional exertional angina pectoris (CCS class 2) for the last few years. She is currently chest pain-free.  She denies dyspnea, but appears clinically tachypneic lying on the stretcher resting heart rate is 110 bpm.  Her ECG shows NSR, RBBB and chronic anterolateral ST-T changes, similar to tracings from 2020. Her hsTrop is marginally elevated 6 to 26.  Kamelia has super obesity,long-standing severe diabetes mellitus with multiple microvascular and macrovascular complications. She has extensive multivessel coronary artery disease with diffuse involvement, making her a poor candidate for either surgical or percutaneous revascularization.She has chronic diastolic heart failure as well as chronic kidney disease.  Glycemic control is recently poor (A1c 10%). LDL was 79 (max dose atorvastatin, which previously kept LDL in 50s).   Past Medical History:  Diagnosis Date   Blind right eye    Chronic back pain    "my whole back" (03/18/2015)   Chronic chest pain    takes isosorbide   Chronic diastolic heart failure ([place]) 9/13   echo 03/20/15 LV Ef of  60-65%, grade 2DD and PA peak pressure: 36 mm Hg    CKD (chronic kidney disease), stage III    Coronary artery disease cardiologist-- dr Dixie Jafri   per cardiac cath 02-07-2009 -- diffuse 3V CAD involving RCA, CFx, and D1,  pt not a candidate for bypass surgery or angioplasty,  medical management   Diabetic neuropathy ([place])    Diabetic retinopathy ([place])    bilateral proliferative   Diastolic CHF, chronic ([place])    Dyspnea    "always"   Edema of both lower extremities    Generalized weakness    GERD (gastroesophageal reflux disease)    Glaucoma, both eyes    History of non-ST elevation myocardial infarction (NSTEMI)    02-25-2009;  05-15-2009;  01-08-2010;  04-04-2012 this MI was post-op surgery for abscess on 04-03-2012   History of sepsis    Hyperlipemia    Hypertension    Insulin dependent type 2 diabetes mellitus ([place])    followed by pcp   Iron deficiency anemia due to chronic blood loss    uterine bleeding   Legally blind in left eye, as defined in [place]    per pt states vision "is foggy"   Mild obstructive sleep apnea    study in epic 04-19-2012 mild osa,  recommendation's were wt. loss, dental  appliance, surgery, or cpap   Morbid obesity with BMI of 40.0-44.9, adult ([place])    OA (osteoarthritis)    knees   PSVT (paroxysmal supraventricular tachycardia) ([place])    Renal insufficiency    pt. denies   Seasonal allergies  Wears glasses     Past Surgical History:  Procedure Laterality Date   BIOPSY  02/13/2018   Procedure: BIOPSY;  Surgeon: Ladene Artist, MD;  Location: [facility];  Service: Endoscopy;;   BREAST SURGERY Right 2019    breast biopsy,  per pt benign   CARDIAC CATHETERIZATION  02-27-2009   dr al little   diffuse 3V CAD , involving RCA, CFx, and D1;  inferior wall abnormalities, low normal ef 50%   CATARACT EXTRACTION Right    CATARACT EXTRACTION W/ INTRAOCULAR LENS IMPLANT Left 10-11-2007   '@[facility]'    COLONOSCOPY WITH PROPOFOL N/A  02/13/2018   Procedure: COLONOSCOPY WITH PROPOFOL;  Surgeon: Ladene Artist, MD;  Location: [facility];  Service: Endoscopy;  Laterality: N/A;   DILATION AND CURETTAGE OF UTERUS  x2 last one 2000   EYE SURGERY Bilateral 2009 to 2010   x2  left eye;  x2  right eye    HYSTEROSCOPY WITH D & C N/A 10/11/2018   Procedure: DILATATION AND CURETTAGE /HYSTEROSCOPY;  Surgeon: [name], MD;  Location: [facility];  Service: Gynecology;  Laterality: N/A;   INCISION AND DRAINAGE PERIRECTAL ABSCESS  04/03/2012   INTRAUTERINE DEVICE (IUD) INSERTION  10/11/2018   Procedure: INTRAUTERINE DEVICE (IUD) INSERTION;  Surgeon: [name], MD;  Location: [facility];  Service: Gynecology;;   IR US GUIDE VASC ACCESS LEFT  02/13/2018   IR VENIPUNCTURE 27YRS/OLDER BY MD  02/13/2018   POLYPECTOMY  02/13/2018   Procedure: POLYPECTOMY;  Surgeon: Ladene Artist, MD;  [facility]: [facility];  Service: Endoscopy;;     Home Medications:  Prior to Admission medications   Medication Sig Start Date End Date Taking? Authorizing Provider  Accu-Chek Softclix Lancets lancets Use as instructed for 3 times daily blood glucose monitoring 12/24/19   Ladell Pier, MD  acetaminophen (TYLENOL) 500 MG tablet Take 1 tablet (500 mg total) by mouth every 6 (six) hours as needed. Patient taking differently: Take 500 mg by mouth every 6 (six) hours as needed (pain.).  09/09/16   Clent Demark, PA-C  acetaZOLAMIDE (DIAMOX) 500 MG capsule Take 500 mg by mouth 2 (two) times daily.    [provider]  aspirin EC 81 MG tablet Take 81 mg by mouth daily.    [provider]  atorvastatin (LIPITOR) 80 MG tablet TAKE 1 TABLET BY MOUTH ONCE DAILY IN THE EVENING AT 6  PM 01/22/20   Ladell Pier, MD  bismuth subsalicylate (PEPTO BISMOL) 262 MG/15ML suspension Take 30 mLs by mouth every 6 (six) hours as needed for indigestion.    [provider]  Blood Glucose Monitoring Suppl (ACCU-CHEK AVIVA PLUS) w/Device KIT  Used as directed 09/29/18   Ladell Pier, MD  Blood Pressure Monitoring (BLOOD PRESSURE MONITOR/ARM) DEVI 1 each by Does not apply route daily. ICD 10, I10 and I50.32 03/07/17   Funches, Josalyn, MD  brimonidine-timolol (COMBIGAN) 0.2-0.5 % ophthalmic solution Place 1 drop into both eyes 2 (two) times daily.     [provider]  calcium carbonate (TUMS - DOSED IN MG ELEMENTAL CALCIUM) 500 MG chewable tablet Chew 1 tablet by mouth 3 (three) times daily as needed for indigestion.     [provider]  Cyanocobalamin (VITAMIN B-12) 1000 MCG TABS Take 1,000 mcg by mouth daily. 09/15/17   Ladell Pier, MD  diltiazem (DILACOR XR) 240 MG 24 hr capsule Take 1 capsule (240 mg total) by mouth daily. 09/15/17   Karle Plumber  B, MD  ferrous sulfate 325 (65 FE) MG tablet Take 325 mg by mouth daily with breakfast.    [provider]  furosemide (LASIX) 40 MG tablet TAKE 1 TABLET BY MOUTH  TWICE DAILY (TAKE WITH  FUROSEMIDE 80 MG TAB) 01/16/20   Ladell Pier, MD  furosemide (LASIX) 80 MG tablet TAKE 1 TABLET BY MOUTH TWO  TIMES DAILY WITH FUROSEMIDE 40MG  (TOTAL DOSE OF 120MG  TWO TIMES DAILY ) 01/16/20   Ladell Pier, MD  glucose blood test strip Use TID before meals / Dx E11.9 09/29/18   Ladell Pier, MD  glucose monitoring kit (FREESTYLE) monitoring kit 1 each by Does not apply route 4 (four) times daily - after meals and at bedtime. 09/12/15   Funches, Adriana Mccallum, MD  insulin lispro protamine-lispro (HUMALOG MIX 75/25) (75-25) 100 UNIT/ML SUSP injection INJECT SUBCUTANEOUSLY 54  UNITS TWICE DAILY WITH  MEALS. Please make PCP appointment. 12/24/19   Ladell Pier, MD  isosorbide mononitrate (IMDUR) 120 MG 24 hr tablet TAKE 1 TABLET BY MOUTH ONCE DAILY 11/19/19   Ladell Pier, MD  losartan (COZAAR) 50 MG tablet Take 2 tablets by mouth once daily 04/04/19   Ladell Pier, MD  LUMIGAN 0.01 % SOLN Place 1 drop into the right eye at bedtime.  03/06/14   [provider]  megestrol (MEGACE) 40 MG tablet Take 1 tablet (40 mg total) by mouth 2 (two) times daily. 08/27/19   [name], MD  Menthol-Methyl Salicylate (MUSCLE RUB EX) Apply 1 application topically daily as needed (knee pain).     [provider]  metoCLOPramide (REGLAN) 5 MG tablet TAKE 1 TABLET BY MOUTH TWICE DAILY BEFORE THE TWO LARGEST MEALS OF THE DAY 02/26/19   Ladell Pier, MD  metoprolol (TOPROL-XL) 200 MG 24 hr tablet Take 1 tablet by mouth once daily 11/19/19   Jovonda Selner, MD  nitroGLYCERIN (NITROSTAT) 0.4 MG SL tablet Place 1 tablet (0.4 mg total) under the tongue every 5 (five) minutes as needed for chest pain. Max 3 doses in 15 minutes. <PLEASE MAKE APPOINTMENT FOR REFILLS> 11/23/19   Patsy Zaragoza, MD  pantoprazole (PROTONIX) 40 MG tablet TAKE ONE TABLET BY MOUTH ONCE DAILY Patient taking differently: Take 40 mg by mouth daily.  01/13/16   Funches, Adriana Mccallum, MD  potassium chloride SA (KLOR-CON) 20 MEQ tablet TAKE 2 TABLETS BY MOUTH IN  THE MORNING AND 1 TABLET IN THE EVENING 10/30/19   Pranav Lince, MD  RELION INSULIN SYRINGE 31G X 15/64" 1 ML MISC USE AS DIRECTED TWICE DAILY INSULIN ADMINISTRATION 10/02/19   Ladell Pier, MD    Inpatient Medications: Scheduled Meds:  acetaminophen  1,000 mg Oral Once   Continuous Infusions:  PRN Meds:   Allergies:    Allergies  Allergen Reactions   Ativan [Lorazepam] Anaphylaxis   Orange Fruit [Citrus] Anaphylaxis and Other (See Comments)    Pt stated throat swelling, itching    Social History:   Social History   Socioeconomic History   Marital status: Married    Spouse name: Not on file   Number of children: 0   Years of education: Not on file   Highest education level: Not on file  Occupational History   Occupation: disabled  Tobacco Use   Smoking status: Never Smoker   Smokeless tobacco: Never Used  Scientific laboratory technician Use: Never used  Substance and Sexual Activity   Alcohol  use: No  Drug use: Never   Sexual activity: Yes    Birth control/protection: None    Comment: Married  Other Topics Concern   Not on file  Social History Narrative   Not on file   Social Determinants of Health   Financial Resource Strain:    Difficulty of Paying Living Expenses:   Food Insecurity:    Worried About Charity fundraiser in the Last Year:    Arboriculturist in the Last Year:   Transportation Needs:    Film/video editor (Medical):    Lack of Transportation (Non-Medical):   Physical Activity:    Days of Exercise per Week:    Minutes of Exercise per Session:   Stress:    Feeling of Stress :   Social Connections:    Frequency of Communication with Friends and Family:    Frequency of Social Gatherings with Friends and Family:    Attends Religious Services:    Active Member of Clubs or Organizations:    Attends Music therapist:    Marital Status:   Intimate Partner Violence:    Fear of Current or Ex-Partner:    Emotionally Abused:    Physically Abused:    Sexually Abused:     Family History:    Family History  Problem Relation Age of Onset   Diabetes Mother    Heart disease Mother    Heart attack Mother    Hypertension Mother    Breast cancer Mother    Colon polyps Father    Diabetes Father    Heart disease Father    Heart attack Father    Stroke Father    Hypertension Father    Diabetes Brother    Diabetes Sister    Heart disease Brother    Heart disease Sister    Diabetes Maternal Grandmother    Hypertension Maternal Grandmother    Hypertension Brother    Hypertension Sister    Breast cancer Cousin    Colon cancer Cousin    Esophageal cancer Neg Hx    Rectal cancer Neg Hx    Stomach cancer Neg Hx      ROS:  Please see the history of present illness.   All other ROS reviewed and negative.     Physical Exam/Data:   Vitals:   01/23/20 1236 01/23/20 1407 01/23/20 1714  01/23/20 1823  BP: (!) 184/84 137/68 (!) 161/86   Pulse: (!) 106 95 100   Resp: '20 15 16 16  ' Temp: 98.3 F (36.8 C) 98.5 F (36.9 C) 98.2 F (36.8 C)   TempSrc:  Oral Oral   SpO2: 98% 98% 100%    No intake or output data in the 24 hours ending 01/23/20 2001 Last 3 Weights 01/23/2020 12/24/2019 03/28/2019  Weight (lbs) 283 lb 284 lb 6.4 oz 272 lb  Weight (kg) 128.368 kg 129.003 kg 123.378 kg     There is no height or weight on file to calculate BMI.  General:  Morbidly obese, Well nourished, well developed, in no acute distress HEENT: normal Lymph: no adenopathy Neck: Unable to evaluate jugular veins due to obesity Endocrine:  No thryomegaly Vascular: No carotid bruits; FA pulses 2+ bilaterally without bruits  Cardiac:  normal S1, S2; RRR; no murmur,   Lungs:  clear to auscultation bilaterally, no wheezing, rhonchi or rales  Abd: soft, nontender, no hepatomegaly  Ext: Symmetrical non-pitting edema of the ankles Musculoskeletal:  No deformities, BUE and BLE strength normal and equal  Skin: warm and dry  Neuro:  CNs 2-12 intact, no focal abnormalities noted Psych:  Normal affect   EKG:  The EKG was personally reviewed and demonstrates: Mild sinus tachycardia, RBBB (old), chronic anterolateral St-T changes Telemetry:  Telemetry was personally reviewed and demonstrates:  Sinus tachycardia  Relevant CV Studies: - Left ventricle: The cavity size was normal. Wall thickness was  normal. Systolic function was normal. The estimated ejection  fraction was in the range of 55% to 60%. Wall motion was normal;  there were no regional wall motion abnormalities.  - Left atrium: The atrium was mildly dilated.   Laboratory Data:  High Sensitivity Troponin:   Recent Labs  Lab 01/23/20 1239 01/23/20 1523  TROPONINIHS 6 26*     Chemistry Recent Labs  Lab 01/23/20 1239  NA 139  K 4.2  CL 108  CO2 21*  GLUCOSE 266*  BUN 10  CREATININE 1.16*  CALCIUM 9.0  GFRNONAA 49*  GFRAA  57*  ANIONGAP 10    No results for input(s): PROT, ALBUMIN, AST, ALT, ALKPHOS, BILITOT in the last 168 hours. Hematology Recent Labs  Lab 01/23/20 1239  WBC 11.2*  RBC 4.03  HGB 12.5  HCT 38.8  MCV 96.3  MCH 31.0  MCHC 32.2  RDW 13.8  PLT 241   BNPNo results for input(s): BNP, PROBNP in the last 168 hours.  DDimer No results for input(s): DDIMER in the last 168 hours.   Radiology/Studies:  DG Chest 2 View  Result Date: 01/23/2020 CLINICAL DATA:  Chest pressure that began yesterday. Additional history provided: Chest pressure and shortness of breath for 2 days. History of CKD, CAD, CHF, GERD, hypertension. EXAM: CHEST - 2 VIEW COMPARISON:  Prior chest CT 09/20/2016, chest radiograph 09/09/2016 FINDINGS: Heart size within normal limits. No appreciable airspace consolidation within the lungs. No frank pulmonary edema. No evidence of pleural effusion or pneumothorax. No acute bony abnormality identified. IMPRESSION: No evidence of acute cardiopulmonary abnormality. Please refer to prior chest CT 09/20/2016 for a description of previously demonstrated small pulmonary nodules and follow-up recommendations. Electronically Signed   By: Kellie Simmering DO   On: 01/23/2020 13:37    Assessment and Plan:   1. CHF:  Constellation of tachycardia improvement in symptoms only after taking sublingual nitroglycerin suggests possible hyperkalemia/.  Increase diuretics.  Recheck echocardiogram. Despite lack of data yet in HFpEF, I suspect Wilder Glade would be beneficial, rather than higher dose insulin. 2. CAD:  Angina pectoris was previously well controlled on a combination of beta-blocker and high-dose long-acting nitrates.  She has extensive CAD not amenable to the surgical or percutaneous revascularization.  Doubt true acute coronary atherothrombotic event causing her symptoms.  Suspect that she is ischemia exacerbation uncontrolled hypertension 3. HLP: LDL is slightly worse and is now slightly above the  target range of 70.  She is on a maximum dose of atorvastatin.  Deterioration is possibly related to worsening glycemic control. 4. DM: Hemoglobin A1c has worsened from 7.5% a year ago to 10% now.  She is adjusting her medications with Dr. Wynetta Emery.  Lower levels of physical activity due to her back and knee problems may be playing a role in increasing glucose levels.  Since her blood pressure, glycemic control and lipid profile are simultaneously worsening also worry about her ability to be fully compliant with medications. 5. CKD 3: Followed by Dr. Joelyn Oms.  Creatinine is at her baseline of 1.2-1.3 or slightly better. 6. Super obesity: Weight has not changed much over  the last couple of years. 7. HTN: need to improve BP control. She is on max dose beta blocker and losartan. Could increase diltiazem (as long as EF is still OK), add hydralazine or add spironolactone.       For questions or updates, please contact [place] Please consult [email or web address] for contact info under    Signed, Sanda Klein, MD  01/23/2020 8:01 PM

## 2020-01-23 NOTE — ED Provider Notes (Signed)
[facility] Emergency Department Provider Note MRN:  [id number]  Arrival date & time: 01/23/20     Chief Complaint   Chest Pain   History of Present Illness   [name] is a 64 y.o. year-old female with a history of CHF, CAD presenting to the ED with chief complaint of chest pain.  2 episodes of chest pain over the past few days.  First episode was Sunday midday while she was watching TV.  Sudden onset pressure in the center of the chest, nonradiating, mild to moderate in severity.  Took one of her husband's nitro tablets and this helped.  Pain episode again this morning, all similar, resolved after 1 or 2 hours.  Associated with shortness of breath.  Currently without symptoms.  No exacerbating relieving factors.  Review of Systems  A complete 10 system review of systems was obtained and all systems are negative except as noted in the HPI and PMH.   Patient's Health History    Past Medical History:  Diagnosis Date  . Blind right eye   . Chronic back pain    "my whole back" (03/18/2015)  . Chronic chest pain    takes isosorbide  . Chronic diastolic heart failure ([place]) 9/13   echo 03/20/15 LV Ef of 60-65%, grade 2DD and PA peak pressure: 36 mm Hg   . CKD (chronic kidney disease), stage III   . Coronary artery disease cardiologist-- dr croitoru   per cardiac cath 02-07-2009 -- diffuse 3V CAD involving RCA, CFx, and D1,  pt not a candidate for bypass surgery or angioplasty,  medical management  . Diabetic neuropathy ([place])   . Diabetic retinopathy ([place])    bilateral proliferative  . Diastolic CHF, chronic ([place])   . Dyspnea    "always"  . Edema of both lower extremities   . Generalized weakness   . GERD (gastroesophageal reflux disease)   . Glaucoma, both eyes   . History of non-ST elevation myocardial infarction (NSTEMI)    02-25-2009;  05-15-2009;  01-08-2010;  04-04-2012 this MI was post-op surgery for abscess on 04-03-2012  . History of sepsis    . Hyperlipemia   . Hypertension   . Insulin dependent type 2 diabetes mellitus ([place])    followed by pcp  . Iron deficiency anemia due to chronic blood loss    uterine bleeding  . Legally blind in left eye, as defined in [place]    per pt states vision "is foggy"  . Mild obstructive sleep apnea    study in epic 04-19-2012 mild osa,  recommendation's were wt. loss, dental  appliance, surgery, or cpap  . Morbid obesity with BMI of 40.0-44.9, adult ([place])   . OA (osteoarthritis)    knees  . PSVT (paroxysmal supraventricular tachycardia) ([place])   . Renal insufficiency    pt. denies  . Seasonal allergies   . Wears glasses     Past Surgical History:  Procedure Laterality Date  . BIOPSY  02/13/2018   Procedure: BIOPSY;  Surgeon: Ladene Artist, MD;  Location: [facility];  Service: Endoscopy;;  . BREAST SURGERY Right 2019    breast biopsy,  per pt benign  . CARDIAC CATHETERIZATION  02-27-2009   dr al little   diffuse 3V CAD , involving RCA, CFx, and D1;  inferior wall abnormalities, low normal ef 50%  . CATARACT EXTRACTION Right   . CATARACT EXTRACTION W/ INTRAOCULAR LENS IMPLANT Left 10-11-2007   @[facility]   . COLONOSCOPY WITH PROPOFOL  N/A 02/13/2018   Procedure: COLONOSCOPY WITH PROPOFOL;  Surgeon: Ladene Artist, MD;  [facility];  Service: Endoscopy;  Laterality: N/A;  . DILATION AND CURETTAGE OF UTERUS  x2 last one 2000  . EYE SURGERY Bilateral 2009 to 2010   x2  left eye;  x2  right eye   . HYSTEROSCOPY WITH D & C N/A 10/11/2018   Procedure: DILATATION AND CURETTAGE /HYSTEROSCOPY;  Surgeon: [name], MD;  [facility];  Service: Gynecology;  Laterality: N/A;  . INCISION AND DRAINAGE PERIRECTAL ABSCESS  04/03/2012  . INTRAUTERINE DEVICE (IUD) INSERTION  10/11/2018   Procedure: INTRAUTERINE DEVICE (IUD) INSERTION;  Surgeon: [name], MD;  [facility];  Service: Gynecology;;  . IR US GUIDE [place] LEFT  02/13/2018  . IR VENIPUNCTURE 43YRS/OLDER BY MD   02/13/2018  . POLYPECTOMY  02/13/2018   Procedure: POLYPECTOMY;  Surgeon: Ladene Artist, MD;  Location: [facility];  Service: Endoscopy;;    Family History  Problem Relation Age of Onset  . Diabetes Mother   . Heart disease Mother   . Heart attack Mother   . Hypertension Mother   . Breast cancer Mother   . Colon polyps Father   . Diabetes Father   . Heart disease Father   . Heart attack Father   . Stroke Father   . Hypertension Father   . Diabetes Brother   . Diabetes Sister   . Heart disease Brother   . Heart disease Sister   . Diabetes Maternal Grandmother   . Hypertension Maternal Grandmother   . Hypertension Brother   . Hypertension Sister   . Breast cancer Cousin   . Colon cancer Cousin   . Esophageal cancer Neg Hx   . Rectal cancer Neg Hx   . Stomach cancer Neg Hx     Social History   Socioeconomic History  . Marital status: Married    Spouse name: Not on file  . Number of children: 0  . Years of education: Not on file  . Highest education level: Not on file  Occupational History  . Occupation: disabled  Tobacco Use  . Smoking status: Never Smoker  . Smokeless tobacco: Never Used  Vaping Use  . Vaping Use: Never used  Substance and Sexual Activity  . Alcohol use: No  . Drug use: Never  . Sexual activity: Yes    Birth control/protection: None    Comment: Married  Other Topics Concern  . Not on file  Social History Narrative  . Not on file   Social Determinants of Health   Financial Resource Strain:   . Difficulty of Paying Living Expenses:   Food Insecurity:   . Worried About Charity fundraiser in the Last Year:   . Arboriculturist in the Last Year:   Transportation Needs:   . Film/video editor (Medical):   Marland Kitchen Lack of Transportation (Non-Medical):   Physical Activity:   . Days of Exercise per Week:   . Minutes of Exercise per Session:   Stress:   . Feeling of Stress :   Social Connections:   . Frequency of Communication with  Friends and Family:   . Frequency of Social Gatherings with Friends and Family:   . Attends Religious Services:   . Active Member of Clubs or Organizations:   . Attends Archivist Meetings:   Marland Kitchen Marital Status:   Intimate Partner Violence:   . Fear of Current  or Ex-Partner:   . Emotionally Abused:   Marland Kitchen Physically Abused:   . Sexually Abused:      Physical Exam   Vitals:   01/23/20 1714 01/23/20 1823  BP: (!) 161/86   Pulse: 100   Resp: 16 16  Temp: 98.2 F (36.8 C)   SpO2: 100%     CONSTITUTIONAL: Well-appearing, NAD NEURO:  Alert and oriented x 3, no focal deficits EYES:  eyes equal and reactive ENT/NECK:  no LAD, no JVD CARDIO: Regular rate, well-perfused, normal S1 and S2 PULM:  CTAB no wheezing or rhonchi GI/GU:  normal bowel sounds, non-distended, non-tender MSK/SPINE:  No gross deformities, no edema SKIN:  no rash, atraumatic PSYCH:  Appropriate speech and behavior  *Additional and/or pertinent findings included in MDM below  Diagnostic and Interventional Summary    EKG Interpretation  Date/Time:  Wednesday January 23 2020 12:26:52 EDT Ventricular Rate:  103 PR Interval:  130 QRS Duration: 128 QT Interval:  390 QTC Calculation: 510 R Axis:   -13 Text Interpretation: Sinus tachycardia Right bundle branch block T wave abnormality, consider inferior ischemia Abnormal ECG Confirmed by Gerlene Fee [phone]) on 01/23/2020 6:07:42 PM      Labs Reviewed  BASIC METABOLIC PANEL - Abnormal; Notable for the following components:      Result Value   CO2 21 (*)    Glucose, Bld 266 (*)    Creatinine, Ser 1.16 (*)    GFR calc non Af Amer 49 (*)    GFR calc Af Amer 57 (*)    All other components within normal limits  CBC - Abnormal; Notable for the following components:   WBC 11.2 (*)    All other components within normal limits  TROPONIN I (HIGH SENSITIVITY) - Abnormal; Notable for the following components:   Troponin I (High Sensitivity) 26 (*)    All  other components within normal limits  SARS CORONAVIRUS 2 BY RT PCR (HOSPITAL ORDER, [facility])  TROPONIN I (HIGH SENSITIVITY)    DG Chest 2 View  Final Result      Medications  sodium chloride flush (NS) 0.9 % injection 3 mL (3 mLs Intravenous Given 01/23/20 1905)  aspirin chewable tablet 324 mg (324 mg Oral Given 01/23/20 1835)     Procedures  /  Critical Care Procedures  ED Course and Medical Decision Making  I have reviewed the triage vital signs, the nursing notes, and pertinent available records from the EMR.  Listed above are laboratory and imaging tests that I personally ordered, reviewed, and interpreted and then considered in my medical decision making (see below for details).      History of CAD here with chest pain, EKG with possibly subtle changes inferiorly but could be nonspecific.  Troponin rising mildly from 6-26.  Discussed with Dr. Sallyanne Kuster, who agrees with hospitalist admission to maximize medical therapy.    Barth Kirks. [place]onia Small, [place] [email or web address]  Final Clinical Impressions(s) / ED Diagnoses     ICD-10-CM   1. Chest pain, unspecified type  R07.9     ED Discharge Orders    None       Discharge Instructions Discussed with and Provided to Patient:   Discharge Instructions   None       Maudie Flakes, MD 01/23/20 [phone]

## 2020-01-23 NOTE — ED Triage Notes (Addendum)
Pt is here with on & off chest pressure that started yesterday afternoon, pt was at her rehab appt & staff took her BP before & it was BP- 218/111 Pulse- 112 this morning. Pt is denying active chest pain/pressure currently.

## 2020-01-23 NOTE — ED Notes (Signed)
Patient is being discharged from the Urgent Care and sent to the Emergency Department via [place]. Per DR. Lamptey patient is in need of higher level of care due to active CP, EKG changes. Patient is aware and verbalizes understanding of plan of care.  Vitals:   01/23/20 1201  BP: (!) 159/74  Pulse: (!) 103  Resp: (!) 21  Temp: 99.1 F (37.3 C)  SpO2: 99%

## 2020-01-23 NOTE — ED Provider Notes (Addendum)
[facility]    CSN: [id number] Arrival date & time: 01/23/20  1149      History   Chief Complaint Chief Complaint  Patient presents with   Chest Pain    HPI [name] is a 64 y.o. female with a history of hypertension, coronary artery disease, diabetes mellitus type 2, and obesity comes to the urgent care with recurrent precordial chest pain which has worsened over the past couple of days.  Patient has had intermittent precordial chest pressure.  Pain is worse with activity and is relieved with sublingual nitro.  Pain radiates to the shoulder.  She admits having some dizziness and shortness of breath associated with the pain.  No diaphoresis.  No palpitations.  Patient has not had a cardiac cath in the past 5 years.  No record of stress test in the system.  HPI  Past Medical History:  Diagnosis Date   Blind right eye    Chronic back pain    "my whole back" (03/18/2015)   Chronic chest pain    takes isosorbide   Chronic diastolic heart failure ([place]) 9/13   echo 03/20/15 LV Ef of 60-65%, grade 2DD and PA peak pressure: 36 mm Hg    CKD (chronic kidney disease), stage III    Coronary artery disease cardiologist-- dr croitoru   per cardiac cath 02-07-2009 -- diffuse 3V CAD involving RCA, CFx, and D1,  pt not a candidate for bypass surgery or angioplasty,  medical management   Diabetic neuropathy ([place])    Diabetic retinopathy ([place])    bilateral proliferative   Diastolic CHF, chronic ([place])    Dyspnea    "always"   Edema of both lower extremities    Generalized weakness    GERD (gastroesophageal reflux disease)    Glaucoma, both eyes    History of non-ST elevation myocardial infarction (NSTEMI)    02-25-2009;  05-15-2009;  01-08-2010;  04-04-2012 this MI was post-op surgery for abscess on 04-03-2012   History of sepsis    Hyperlipemia    Hypertension    Insulin dependent type 2 diabetes mellitus ([place])    followed by pcp   Iron deficiency  anemia due to chronic blood loss    uterine bleeding   Legally blind in left eye, as defined in [place]    per pt states vision "is foggy"   Mild obstructive sleep apnea    study in epic 04-19-2012 mild osa,  recommendation's were wt. loss, dental  appliance, surgery, or cpap   Morbid obesity with BMI of 40.0-44.9, adult ([place])    OA (osteoarthritis)    knees   PSVT (paroxysmal supraventricular tachycardia) ([place])    Renal insufficiency    pt. denies   Seasonal allergies    Wears glasses     Patient Active Problem List   Diagnosis Date Noted   Functional urinary incontinence 09/07/2019   Urge incontinence of urine 09/07/2019   Legally blind in right eye, as defined in [place] 08/11/2018   Bilateral carpal tunnel syndrome 04/13/2018   Adenomatous polyp of colon 04/13/2018   Fibrocystic breast changes, right 12/12/2017   Drug-induced constipation 09/15/2017   Primary osteoarthritis of both knees 04/05/2017   Chronic bilateral low back pain 03/07/2017   Chronic pain of right knee 03/07/2017   Vitamin B12 deficiency 12/09/2016   Incidental lung nodule, > 70m and < 871m02/13/2018   Vitreous hemorrhage of right eye ([place]   Diabetic gastroparesis associated with type 2 diabetes mellitus (  [place]) 09/12/2015   Controlled type 2 diabetes mellitus with complication, with long-term current use of insulin ([place]) 09/12/2015   Chronic renal insufficiency, stage III (moderate) 03/19/2015   Prolonged Q-T interval on ECG 08/10/2013   Dyslipidemia 04/02/2013   Chronic diastolic heart failure, NYHA class 3 ([place]) 04/07/2012   Presumed OSA (obstructive sleep apnea) 04/06/2012   NSTEMI  post-op 04/04/12-medical Rx 04/04/2012   Severe obesity (BMI >= 40) ([place]) 01/17/2012   GERD 08/21/2009   Complex endometrial hyperplasia with atypia 03/20/2009   PERIPHERAL EDEMA 03/20/2009   Coronary atherosclerosis-not a surgical or PCI candidate 2010 02/27/2009   Proliferative diabetic  retinopathy ([place]) 10/07/2007   DETACHED RETINA, BILATERAL, HX OF 09/07/2007   History of chronic Iron defeicency anemia with acute post op anemia, transfused after debridment 04/04/12 04/17/2007   Essential hypertension 04/17/2007    Past Surgical History:  Procedure Laterality Date   BIOPSY  02/13/2018   Procedure: BIOPSY;  Surgeon: Ladene Artist, MD;  [facility];  Service: Endoscopy;;   BREAST SURGERY Right 2019    breast biopsy,  per pt benign   CARDIAC CATHETERIZATION  02-27-2009   dr al little   diffuse 3V CAD , involving RCA, CFx, and D1;  inferior wall abnormalities, low normal ef 50%   CATARACT EXTRACTION Right    CATARACT EXTRACTION W/ INTRAOCULAR LENS IMPLANT Left 10-11-2007   _0    COLONOSCOPY WITH PROPOFOL N/A 02/13/2018   Procedure: COLONOSCOPY WITH PROPOFOL;  Surgeon: Ladene Artist, MD;  [facility];  Service: Endoscopy;  Laterality: N/A;   DILATION AND CURETTAGE OF UTERUS  x2 last one 2000   EYE SURGERY Bilateral 2009 to 2010   x2  left eye;  x2  right eye    HYSTEROSCOPY WITH D & C N/A 10/11/2018   Procedure: DILATATION AND CURETTAGE /HYSTEROSCOPY;  Surgeon: [name], MD;  [facility];  Service: Gynecology;  Laterality: N/A;   INCISION AND DRAINAGE PERIRECTAL ABSCESS  04/03/2012   INTRAUTERINE DEVICE (IUD) INSERTION  10/11/2018   Procedure: INTRAUTERINE DEVICE (IUD) INSERTION;  Surgeon: [name], MD;  [facility];  Service: Gynecology;;   IR US GUIDE VASC ACCESS LEFT  02/13/2018   IR VENIPUNCTURE 42YRS/OLDER BY MD  02/13/2018   POLYPECTOMY  02/13/2018   Procedure: POLYPECTOMY;  Surgeon: Ladene Artist, MD;  [facility];  Service: Endoscopy;;    OB History    Gravida  0   Para  0   Term  0   Preterm  0   AB  0   Living  0     SAB  0   TAB  0   Ectopic  0   Multiple  0   Live Births  0            Home Medications    Prior to Admission medications   Medication Sig Start  Date End Date Taking? Authorizing Provider  Accu-Chek Softclix Lancets lancets Use as instructed for 3 times daily blood glucose monitoring 12/24/19   Ladell Pier, MD  acetaminophen (TYLENOL) 500 MG tablet Take 1 tablet (500 mg total) by mouth every 6 (six) hours as needed. Patient taking differently: Take 500 mg by mouth every 6 (six) hours as needed (pain.).  09/09/16   Clent Demark, PA-C  acetaZOLAMIDE (DIAMOX) 500 MG capsule Take 500 mg by mouth 2 (two) times daily.    [provider]  aspirin EC 81 MG tablet Take 81  mg by mouth daily.    [provider]  atorvastatin (LIPITOR) 80 MG tablet TAKE 1 TABLET BY MOUTH ONCE DAILY IN THE EVENING AT 6  PM 01/22/20   Ladell Pier, MD  bismuth subsalicylate (PEPTO BISMOL) 262 MG/15ML suspension Take 30 mLs by mouth every 6 (six) hours as needed for indigestion.    [provider]  Blood Glucose Monitoring Suppl (ACCU-CHEK AVIVA PLUS) w/Device KIT Used as directed 09/29/18   Ladell Pier, MD  Blood Pressure Monitoring (BLOOD PRESSURE MONITOR/ARM) DEVI 1 each by Does not apply route daily. ICD 10, I10 and I50.32 03/07/17   Funches, Josalyn, MD  brimonidine-timolol (COMBIGAN) 0.2-0.5 % ophthalmic solution Place 1 drop into both eyes 2 (two) times daily.     [provider]  calcium carbonate (TUMS - DOSED IN MG ELEMENTAL CALCIUM) 500 MG chewable tablet Chew 1 tablet by mouth 3 (three) times daily as needed for indigestion.     [provider]  Cyanocobalamin (VITAMIN B-12) 1000 MCG TABS Take 1,000 mcg by mouth daily. 09/15/17   Ladell Pier, MD  diltiazem (DILACOR XR) 240 MG 24 hr capsule Take 1 capsule (240 mg total) by mouth daily. 09/15/17   Ladell Pier, MD  ferrous sulfate 325 (65 FE) MG tablet Take 325 mg by mouth daily with breakfast.    [provider]  furosemide (LASIX) 40 MG tablet TAKE 1 TABLET BY MOUTH  TWICE DAILY (TAKE WITH  FUROSEMIDE 80 MG TAB) 01/16/20   Ladell Pier, MD  furosemide (LASIX) 80 MG tablet TAKE 1 TABLET BY MOUTH TWO  TIMES DAILY WITH FUROSEMIDE '40MG'$   (TOTAL DOSE OF '120MG'$   TWO TIMES DAILY ) 01/16/20   Ladell Pier, MD  glucose blood test strip Use TID before meals / Dx E11.9 09/29/18   Ladell Pier, MD  glucose monitoring kit (FREESTYLE) monitoring kit 1 each by Does not apply route 4 (four) times daily - after meals and at bedtime. 09/12/15   Funches, Adriana Mccallum, MD  insulin lispro protamine-lispro (HUMALOG MIX 75/25) (75-25) 100 UNIT/ML SUSP injection INJECT SUBCUTANEOUSLY 54  UNITS TWICE DAILY WITH  MEALS. Please make PCP appointment. 12/24/19   Ladell Pier, MD  isosorbide mononitrate (IMDUR) 120 MG 24 hr tablet TAKE 1 TABLET BY MOUTH ONCE DAILY 11/19/19   Ladell Pier, MD  losartan (COZAAR) 50 MG tablet Take 2 tablets by mouth once daily 04/04/19   Ladell Pier, MD  LUMIGAN 0.01 % SOLN Place 1 drop into the right eye at bedtime.  03/06/14   [provider]  megestrol (MEGACE) 40 MG tablet Take 1 tablet (40 mg total) by mouth 2 (two) times daily. 08/27/19   [name], MD  Menthol-Methyl Salicylate (MUSCLE RUB EX) Apply 1 application topically daily as needed (knee pain).     [provider]  metoCLOPramide (REGLAN) 5 MG tablet TAKE 1 TABLET BY MOUTH TWICE DAILY BEFORE THE TWO LARGEST MEALS OF THE DAY 02/26/19   Ladell Pier, MD  metoprolol (TOPROL-XL) 200 MG 24 hr tablet Take 1 tablet by mouth once daily 11/19/19   Croitoru, Mihai, MD  nitroGLYCERIN (NITROSTAT) 0.4 MG SL tablet Place 1 tablet (0.4 mg total) under the tongue every 5 (five) minutes as needed for chest pain. Max 3 doses in 15 minutes. <PLEASE MAKE APPOINTMENT FOR REFILLS> 11/23/19   Croitoru, Mihai, MD  pantoprazole (PROTONIX) 40 MG tablet TAKE ONE TABLET BY MOUTH ONCE DAILY Patient taking differently:  Take 40 mg by mouth daily.  01/13/16   Funches, Adriana Mccallum, MD  potassium chloride SA (KLOR-CON) 20 MEQ tablet TAKE 2 TABLETS BY MOUTH  IN  THE MORNING AND 1 TABLET IN THE EVENING 10/30/19   Croitoru, Mihai, MD  RELION INSULIN SYRINGE 31G X 15/64" 1 ML MISC USE AS DIRECTED TWICE DAILY INSULIN ADMINISTRATION 10/02/19   Ladell Pier, MD    Family History Family History  Problem Relation Age of Onset   Diabetes Mother    Heart disease Mother    Heart attack Mother    Hypertension Mother    Breast cancer Mother    Colon polyps Father    Diabetes Father    Heart disease Father    Heart attack Father    Stroke Father    Hypertension Father    Diabetes Brother    Diabetes Sister    Heart disease Brother    Heart disease Sister    Diabetes Maternal Grandmother    Hypertension Maternal Grandmother    Hypertension Brother    Hypertension Sister    Breast cancer Cousin    Colon cancer Cousin    Esophageal cancer Neg Hx    Rectal cancer Neg Hx    Stomach cancer Neg Hx     Social History Social History   Tobacco Use   Smoking status: Never Smoker   Smokeless tobacco: Never Used  Scientific laboratory technician Use: Never used  Substance Use Topics   Alcohol use: No   Drug use: Never     Allergies   Ativan [lorazepam] and Orange fruit [citrus]   Review of Systems Review of Systems  HENT: Negative.   Respiratory: Positive for chest tightness and shortness of breath. Negative for wheezing.   Cardiovascular: Positive for chest pain. Negative for palpitations and leg swelling.  Gastrointestinal: Positive for nausea. Negative for abdominal pain and vomiting.  Genitourinary: Negative.   Neurological: Negative for dizziness, light-headedness and headaches.     Physical Exam Triage Vital Signs ED Triage Vitals  Enc Vitals Group     BP 01/23/20 1201 (!) 159/74     Pulse Rate 01/23/20 1201 (!) 103     Resp 01/23/20 1201 (!) 21     Temp 01/23/20 1201 99.1 F (37.3 C)     Temp Source 01/23/20 1201 Oral     SpO2 01/23/20 1201 99 %     Weight 01/23/20 1158 283 lb (128.4 kg)     Height  --      Head Circumference --      Peak Flow --      Pain Score 01/23/20 1157 0     Pain Loc --      Pain Edu? --      Excl. in [place]? --    No data found.  Updated Vital Signs BP (!) 159/74 (BP Location: Right Arm)    Pulse (!) 103    Temp 99.1 F (37.3 C) (Oral)    Resp (!) 21    Wt 128.4 kg    LMP 04/02/2012    SpO2 99%    BMI 55.27 kg/m   Visual Acuity Right Eye Distance:   Left Eye Distance:   Bilateral Distance:    Right Eye Near:   Left Eye Near:    Bilateral Near:     Physical Exam Vitals and nursing note reviewed.  Constitutional:      General: She is not in acute distress.    Appearance:  She is well-developed. She is not ill-appearing.  Cardiovascular:     Rate and Rhythm: Normal rate and regular rhythm.     Heart sounds: Normal heart sounds.  Pulmonary:     Effort: Pulmonary effort is normal. No tachypnea or accessory muscle usage.     Breath sounds: Normal breath sounds. No stridor. No decreased breath sounds, wheezing, rhonchi or rales.  Neurological:     Mental Status: She is alert.      UC Treatments / Results  Labs (all labs ordered are listed, but only abnormal results are displayed) Labs Reviewed - No data to display  EKG   Radiology No results found.  Procedures Procedures (including critical care time)  Medications Ordered in UC Medications - No data to display  Initial Impression / Assessment and Plan / UC Course  I have reviewed the triage vital signs and the nursing notes.  Pertinent labs & imaging results that were available during my care of the patient were reviewed by me and considered in my medical decision making (see chart for details).     1.  Chest pain the patient with multiple risk factors: EKG shows T wave inversions in leads V1, V2.  This is unchanged from the previous EKG.  Patient is advised to go to the emergency department for cardiac work-up.  Patient is agreeable.  Patient is going to be wheeled down to the ED to  be evaluated further. Final Clinical Impressions(s) / UC Diagnoses   Final diagnoses:  Other forms of angina pectoris [facility])   Discharge Instructions   None    ED Prescriptions    None     PDMP not reviewed this encounter.   Chase Picket, MD 01/23/20 1213    Chase Picket, MD 01/23/20 [phone]

## 2020-01-23 NOTE — Therapy (Signed)
[place], [place], [address] Phone: [phone]   Fax:  [phone]  Physical Therapy Treatment  Patient Details  Name: [name] MRN: [id number] Date of Birth: [birth year] Referring Provider (PT): Dr Karle Plumber    Encounter Date: 01/23/2020   PT End of Session - 01/23/20 1647    Visit Number 3    Number of Visits 12    Date for PT Re-Evaluation 02/19/20    Authorization Type Medicare    PT Start Time 1100    PT Stop Time 1124    PT Time Calculation (min) 24 min    Activity Tolerance Patient limited by fatigue;Patient tolerated treatment well    Behavior During Therapy [facility] for tasks assessed/performed           Past Medical History:  Diagnosis Date  . Blind right eye   . Chronic back pain    "my whole back" (03/18/2015)  . Chronic chest pain    takes isosorbide  . Chronic diastolic heart failure ([place]) 9/13   echo 03/20/15 LV Ef of 60-65%, grade 2DD and PA peak pressure: 36 mm Hg   . CKD (chronic kidney disease), stage III   . Coronary artery disease cardiologist-- dr croitoru   per cardiac cath 02-07-2009 -- diffuse 3V CAD involving RCA, CFx, and D1,  pt not a candidate for bypass surgery or angioplasty,  medical management  . Diabetic neuropathy ([place])   . Diabetic retinopathy ([place])    bilateral proliferative  . Diastolic CHF, chronic ([place])   . Dyspnea    "always"  . Edema of both lower extremities   . Generalized weakness   . GERD (gastroesophageal reflux disease)   . Glaucoma, both eyes   . History of non-ST elevation myocardial infarction (NSTEMI)    02-25-2009;  05-15-2009;  01-08-2010;  04-04-2012 this MI was post-op surgery for abscess on 04-03-2012  . History of sepsis   . Hyperlipemia   . Hypertension   . Insulin dependent type 2 diabetes mellitus ([place])    followed by pcp  . Iron deficiency anemia due to chronic blood loss    uterine bleeding  . Legally blind in  left eye, as defined in [place]    per pt states vision "is foggy"  . Mild obstructive sleep apnea    study in epic 04-19-2012 mild osa,  recommendation's were wt. loss, dental  appliance, surgery, or cpap  . Morbid obesity with BMI of 40.0-44.9, adult ([place])   . OA (osteoarthritis)    knees  . PSVT (paroxysmal supraventricular tachycardia) ([place])   . Renal insufficiency    pt. denies  . Seasonal allergies   . Wears glasses     Past Surgical History:  Procedure Laterality Date  . BIOPSY  02/13/2018   Procedure: BIOPSY;  Surgeon: Ladene Artist, MD;  Location: [facility];  Service: Endoscopy;;  . BREAST SURGERY Right 2019    breast biopsy,  per pt benign  . CARDIAC CATHETERIZATION  02-27-2009   dr al little   diffuse 3V CAD , involving RCA, CFx, and D1;  inferior wall abnormalities, low normal ef 50%  . CATARACT EXTRACTION Right   . CATARACT EXTRACTION W/ INTRAOCULAR LENS IMPLANT Left 10-11-2007   @[facility]   . COLONOSCOPY WITH PROPOFOL N/A 02/13/2018   Procedure: COLONOSCOPY WITH PROPOFOL;  Surgeon: Ladene Artist, MD;  Location: [facility];  Service: Endoscopy;  Laterality: N/A;  . DILATION AND CURETTAGE OF UTERUS  x2 last one 2000  . EYE SURGERY Bilateral 2009 to 2010   x2  left eye;  x2  right eye   . HYSTEROSCOPY WITH D & C N/A 10/11/2018   Procedure: DILATATION AND CURETTAGE /HYSTEROSCOPY;  Surgeon: [name], MD;  Location: [facility];  Service: Gynecology;  Laterality: N/A;  . INCISION AND DRAINAGE PERIRECTAL ABSCESS  04/03/2012  . INTRAUTERINE DEVICE (IUD) INSERTION  10/11/2018   Procedure: INTRAUTERINE DEVICE (IUD) INSERTION;  Surgeon: [name], MD;  Location: [facility];  Service: Gynecology;;  . IR US GUIDE [place] LEFT  02/13/2018  . IR VENIPUNCTURE 85YRS/OLDER BY MD  02/13/2018  . POLYPECTOMY  02/13/2018   Procedure: POLYPECTOMY;  Surgeon: Ladene Artist, MD;  [facility]: [facility];  Service: Endoscopy;;    There were no vitals filed for this visit.   Subjective  Assessment - 01/23/20 1110    Subjective Patient had another fall on Sunday. She hit her wrist and it is now swollen and sore. She reported no other difficulty until she got on the nu-step. At that time she reported she has been having intermittent chest pain and tightness over the past two days. Her nitro has helped. She does not have her nitro with her today. See assessment below.    Pertinent History carpel tunnel, Knee OA,    How long can you stand comfortably? can stand at the counter and do dishes.                             [name] Adult PT Treatment/Exercise - 01/23/20 0001      Ambulation/Gait   Gait Comments amabulted 25'x3 with cane. Cane adjusted each time until she reported feeling comfortable with the heaight. She was then tested with her wrist wrap.       Knee/Hip Exercises: Aerobic   Nustep L4 x 5 min (LE only)      Manual Therapy   Manual Therapy Taping    Manual therapy comments did a loose wrist taping to help stabilizae the wrist and decrease pain. Patient reported improved pain with taping. She was able to use her cane                   PT Education - 01/23/20 1647    Education Details reviewed vitals    Person(s) Educated Patient    Methods Explanation;Tactile cues;Verbal cues;Demonstration    Comprehension Verbalized understanding;Returned demonstration;Verbal cues required;Tactile cues required            PT Short Term Goals - 01/23/20 1703      PT SHORT TERM GOAL #1   Title Patient will increase gross bilateral LE strength to 4+/5    Time 3    Period Weeks    Status On-going    Target Date 01/28/20      PT SHORT TERM GOAL #2   Title Patient will transfer sit to stand 3x without increased low back pain.    Time 3    Period Weeks    Status On-going    Target Date 01/28/20      PT SHORT TERM GOAL #3   Title Patient will ambualte 37' with single point cane without increased low back pain    Time 3    Period Weeks     Status On-going    Target Date 01/28/20             PT Long  Term Goals - 01/07/20 1600      PT LONG TERM GOAL #1   Title Patient will ambualte 200' without increased pain in order to walk in her house    Time 6    Period Months    Status New    Target Date 02/18/20      PT LONG TERM GOAL #2   Title Patient will report no falls for 3 weeks    Time 6    Period Weeks    Status New    Target Date 02/18/20      PT LONG TERM GOAL #3   Title Patient will bend to pick object off the floor without falling    Time 6    Period Weeks    Status New    Target Date 02/18/20                 Plan - 01/23/20 1648    Clinical Impression Statement Patient arrived reporting wrist pain following a fall on Sunday.  Her wrist was swwollen. Patient reported feeling like she needed an acewrap. A light wrsp was perfromed using pre-wrap and medical tape. she reported improved pain. She alos reported feeling like her cane was too low. therapy adjusted her cane and had her do some walking in the clinic. Patient was then put on the nu-step. Therapy asked her if she was having any other pain. She reported she had been having chest pain and tightness  The nu-step was halted. Her B/P was measured at 218/111 and her resting HR was 112. She was advised to go to the ED right away. Therapy spoke to Husband who agreed to bring her to the ED.    Personal Factors and Comorbidities Comorbidity 1;Comorbidity 2;Comorbidity 3+    Comorbidities bilateral Knee OA, Low back pain, chronic chest pain with activity (uses nitro); CHF, DMII,    Examination-Activity Limitations Caring for Others;Reach Overhead;Stand;Stairs;Squat;Sit;Lift    Examination-Participation Restrictions Church;Shop;Meal Prep;Community Activity    Stability/Clinical Decision Making Unstable/Unpredictable    Clinical Decision Making High    Rehab Potential Fair    PT Frequency 2x / week    PT Duration 6 weeks    PT Treatment/Interventions  ADLs/Self Care Home Management;Cryotherapy;Aquatic Therapy;Electrical Stimulation;Iontophoresis 4mg /ml Dexamethasone;Traction;Ultrasound;Gait training;Stair training;Therapeutic activities;Functional mobility training;Therapeutic exercise;Balance training;Neuromuscular re-education;Patient/family education;Manual techniques;Passive range of motion;Taping    PT Next Visit Plan NuStep, progress seated strengthening and progress to standing as able, sit<>stand, standing balance at counter, incorporate UE exercises    PT Home Exercise Plan [id number]: LAQ, seated marching, seated clamshell with yellow, seated heel-toe raises, seated pillow squeeze, sit<>stand with UE support    Consulted and Agree with Plan of Care Patient           Patient will benefit from skilled therapeutic intervention in order to improve the following deficits and impairments:  Abnormal gait, Decreased strength, Decreased balance, Decreased activity tolerance, Difficulty walking  Visit Diagnosis: Other abnormalities of gait and mobility  Muscle weakness (generalized)     Problem List Patient Active Problem List   Diagnosis Date Noted  . Functional urinary incontinence 09/07/2019  . Urge incontinence of urine 09/07/2019  . Legally blind in right eye, as defined in [place] 08/11/2018  . Bilateral carpal tunnel syndrome 04/13/2018  . Adenomatous polyp of colon 04/13/2018  . Fibrocystic breast changes, right 12/12/2017  . Drug-induced constipation 09/15/2017  . Primary osteoarthritis of both knees 04/05/2017  . Chronic bilateral low back pain 03/07/2017  . Chronic pain of  right knee 03/07/2017  . Vitamin B12 deficiency 12/09/2016  . Incidental lung nodule, > 85mm and < 71mm 09/14/2016  . Vitreous hemorrhage of right eye ([place])   . Diabetic gastroparesis associated with type 2 diabetes mellitus ([place]) 09/12/2015  . Controlled type 2 diabetes mellitus with complication, with long-term current use of insulin ([place]) 09/12/2015  .  Chronic renal insufficiency, stage III (moderate) 03/19/2015  . Prolonged Q-T interval on ECG 08/10/2013  . Dyslipidemia 04/02/2013  . Chronic diastolic heart failure, NYHA class 3 ([place]) 04/07/2012  . Presumed OSA (obstructive sleep apnea) 04/06/2012  . NSTEMI  post-op 04/04/12-medical Rx 04/04/2012  . Severe obesity (BMI >= 40) ([place]) 01/17/2012  . GERD 08/21/2009  . Complex endometrial hyperplasia with atypia 03/20/2009  . PERIPHERAL EDEMA 03/20/2009  . Coronary atherosclerosis-not a surgical or PCI candidate 2010 02/27/2009  . Proliferative diabetic retinopathy ([place]) 10/07/2007  . DETACHED RETINA, BILATERAL, HX OF 09/07/2007  . History of chronic Iron defeicency anemia with acute post op anemia, transfused after debridment 04/04/12 04/17/2007  . Essential hypertension 04/17/2007    Carney Living PT DPT  01/23/2020, 5:08 PM  [facility] [address] [place], [place], [address] Phone: [phone]   Fax:  [phone]  Name: NOZOMI METTLER MRN: [id number] Date of Birth: Aug 15, 1955

## 2020-01-23 NOTE — ED Triage Notes (Signed)
Pt reports chest pressure that began yesterday while at rehab, reports high blood pressure (218/111) when staff checked her BP. Denies any sweating, nausea, sob or pain radiation. States she took 1 sl nitro pta with some relief. Pt seen at urgent care initially and sent here for further eval.

## 2020-01-24 ENCOUNTER — Encounter (HOSPITAL_COMMUNITY): Payer: Self-pay | Admitting: Family Medicine

## 2020-01-24 ENCOUNTER — Inpatient Hospital Stay (HOSPITAL_COMMUNITY): Payer: Medicare Other

## 2020-01-24 DIAGNOSIS — I5032 Chronic diastolic (congestive) heart failure: Secondary | ICD-10-CM

## 2020-01-24 DIAGNOSIS — J9601 Acute respiratory failure with hypoxia: Secondary | ICD-10-CM

## 2020-01-24 LAB — GLUCOSE, CAPILLARY
Glucose-Capillary: 329 mg/dL — ABNORMAL HIGH (ref 70–99)
Glucose-Capillary: 244 mg/dL — ABNORMAL HIGH (ref 70–99)
Glucose-Capillary: 220 mg/dL — ABNORMAL HIGH (ref 70–99)
Glucose-Capillary: 253 mg/dL — ABNORMAL HIGH (ref 70–99)
Glucose-Capillary: 226 mg/dL — ABNORMAL HIGH (ref 70–99)
Glucose-Capillary: 228 mg/dL — ABNORMAL HIGH (ref 70–99)
Glucose-Capillary: 169 mg/dL — ABNORMAL HIGH (ref 70–99)

## 2020-01-24 LAB — ECHOCARDIOGRAM COMPLETE
Height: 63 in
Weight: 4472 oz

## 2020-01-24 LAB — CBC
HCT: 35.8 % — ABNORMAL LOW (ref 36.0–46.0)
Hemoglobin: 11.6 g/dL — ABNORMAL LOW (ref 12.0–15.0)
MCH: 31 pg (ref 26.0–34.0)
MCHC: 32.4 g/dL (ref 30.0–36.0)
MCV: 95.7 fL (ref 80.0–100.0)
Platelets: 226 10*3/uL (ref 150–400)
RBC: 3.74 MIL/uL — ABNORMAL LOW (ref 3.87–5.11)
RDW: 13.7 % (ref 11.5–15.5)
WBC: 10.5 10*3/uL (ref 4.0–10.5)
nRBC: 0 % (ref 0.0–0.2)

## 2020-01-24 LAB — BASIC METABOLIC PANEL
Anion gap: 8 (ref 5–15)
BUN: 12 mg/dL (ref 8–23)
CO2: 22 mmol/L (ref 22–32)
Calcium: 8.8 mg/dL — ABNORMAL LOW (ref 8.9–10.3)
Chloride: 108 mmol/L (ref 98–111)
Creatinine, Ser: 1.32 mg/dL — ABNORMAL HIGH (ref 0.44–1.00)
GFR calc Af Amer: 49 mL/min — ABNORMAL LOW (ref >60)
GFR calc non Af Amer: 42 mL/min — ABNORMAL LOW (ref >60)
Glucose, Bld: 267 mg/dL — ABNORMAL HIGH (ref 70–99)
Potassium: 4.7 mmol/L (ref 3.5–5.1)
Sodium: 138 mmol/L (ref 135–145)

## 2020-01-24 LAB — HIV ANTIBODY (ROUTINE TESTING W REFLEX): HIV Screen 4th Generation wRfx: NONREACTIVE

## 2020-01-24 LAB — MAGNESIUM
Magnesium: 2 mg/dL (ref 1.7–2.4)
Magnesium: 2.2 mg/dL (ref 1.7–2.4)

## 2020-01-24 LAB — LIPID PANEL
Cholesterol: 126 mg/dL (ref 0–200)
HDL: 31 mg/dL — ABNORMAL LOW (ref >40)
LDL Cholesterol: 81 mg/dL (ref 0–99)
Total CHOL/HDL Ratio: 4.1 RATIO
Triglycerides: 69 mg/dL (ref <150)
VLDL: 14 mg/dL (ref 0–40)

## 2020-01-24 LAB — BRAIN NATRIURETIC PEPTIDE: B Natriuretic Peptide: 27.2 pg/mL (ref 0.0–100.0)

## 2020-01-24 LAB — TROPONIN I (HIGH SENSITIVITY)
Troponin I (High Sensitivity): 13 ng/L (ref <18)
Troponin I (High Sensitivity): 17 ng/L (ref <18)

## 2020-01-24 MED ORDER — INSULIN ASPART 100 UNIT/ML ~~LOC~~ SOLN: 0.0000 [IU] | Freq: Every day | SUBCUTANEOUS | Status: DC

## 2020-01-24 MED ORDER — POTASSIUM CHLORIDE CRYS ER 20 MEQ PO TBCR
40.0000 meq | EXTENDED_RELEASE_TABLET | Freq: Every day | ORAL | Status: DC
  Administered 2020-01-24 – 2020-01-26 (×3): 40 meq via ORAL
  Filled 2020-01-24 (×3): qty 2

## 2020-01-24 MED ORDER — INSULIN ASPART 100 UNIT/ML ~~LOC~~ SOLN
0.0000 [IU] | Freq: Three times a day (TID) | SUBCUTANEOUS | Status: DC
  Administered 2020-01-24: 7 [IU] via SUBCUTANEOUS
  Administered 2020-01-25 (×2): 4 [IU] via SUBCUTANEOUS
  Administered 2020-01-25: 7 [IU] via SUBCUTANEOUS
  Administered 2020-01-26 (×2): 4 [IU] via SUBCUTANEOUS

## 2020-01-24 NOTE — Progress Notes (Signed)
Inpatient Diabetes Program Recommendations  AACE/ADA: New Consensus Statement on Inpatient Glycemic Control (2015)  Target Ranges:  Prepandial:   less than 140 mg/dL      Peak postprandial:   less than 180 mg/dL (1-2 hours)      Critically ill patients:  140 - 180 mg/dL   Lab Results  Component Value Date   GLUCAP 253 (H) 01/24/2020   HGBA1C 10.0 (A) 12/24/2019    Review of Glycemic Control  Diabetes history: DM2 Outpatient Diabetes medications: 75/25 54 units bid, Novolog 0-15 units Q4H Current orders for Inpatient glycemic control: 70/30 40 units bid, Novolog 0-15 units Q4H.   HgbA1C - 10%  Inpatient Diabetes Program Recommendations:     Spoke with pt about her diabetes control and HgbA1C of 10%. Pt states she lost her meter and will need another one. States she tries to eat healthy and leave off sweets. Has not been to PCP recently regarding her diabetes. Discussed importance of getting HgbA1C down to 7% to reduce risk of long-term complications. Pt willing to call PCP for appt. Instructed to check blood sugars 3x/day and take logbook to MD office for review. States she rarely has hypos. Discussed hypoglycemia s/s and treatment. Answered questions.  Needs prescription for Blood glucose meter at discharge. (#[id number])  Thank you. Lorenda Peck, RD, LDN, CDE Inpatient Diabetes Coordinator [phone]

## 2020-01-24 NOTE — Progress Notes (Addendum)
Progress Note    [name]  YNW:[id number] DOB: [birth year]  DOA: 01/23/2020 PCP: Ladell Pier, MD    Brief Narrative:    Medical records reviewed and are as summarized below:  [name] is a very pleasant 64 y.o. female with a past medical history that includes CAD, diastolic heart failure, exertional angina pectoris, diabetes, chronic kidney disease, morbid obesity admitted June 23 with acute respiratory failure with hypoxia secondary to acute on chronic diastolic heart failure and chest pain and hypertension.  Was provided with 40 mg of Lasix IV in the emergency department.  She was also evaluated by cardiology who recommend increasing IV Lasix and further work-up  Assessment/Plan:   Principal Problem:   Acute respiratory failure with hypoxia ([place]) Active Problems:   Essential hypertension   Acute on chronic diastolic heart failure (HCC)   Prolonged Q-T interval on ECG   Chest pain   Severe obesity (BMI >= 40) (HCC)   Chronic renal insufficiency, stage III (moderate)   Controlled type 2 diabetes mellitus with complication, with long-term current use of insulin ([place])   Dyslipidemia   Legally blind in right eye, as defined in [place]   #1.  Acute respiratory failure with hypoxia secondary to acute on chronic diastolic heart failure in the setting of uncontrolled blood pressure.  Oxygen saturation level noted to be 88% on room air at presentation.  Chest x-ray no evidence of acute cardiopulmonary abnormality, BNP 27, she is tachycardic and hypertensive on presentation. she was provided with supplemental oxygen and her oxygen saturation level became greater than 90%. Also provided with 40mg  IV lasix and SL nitro. Evaluated by cardiology who recommended increasing diuretics and obtaining an echocardiogram.  Of note home Lasix dose is 120 mg twice daily. This morning oxygen saturation level greater than 90% on room air.  She still has some increased work of breathing with  conversation -Continue oxygen supplementation as indicated -IV Lasix as noted - improved blood pressure control -monitor  #2.  Acute on chronic diastolic heart failure.  Patient provided with 40 mg of Lasix last night in the emergency department.  Cardiology has increased that dose to 80 mg IV twice daily, first dose due this morning.  Symptoms include Diamox, losartan,imdur, metoprolol, no accurate intake and output. Awaiting todays date. -iv lasix as noted above -follow echo -intake and output -daily weight -appreciate cards assistance  #3.  Chest pain.  Solved this morning.  Patient with a history of angina pectoris, CAD.  EKG with sinus tachycardia and right bundle branch block, hs trop 6>>26>13>17. Evaluated by cards who opine likely ischemia exacerbation related to uncontrolled blood pressure.  Of note cards indicates angina pectoris well controlled in the past and she has extensive CAD not amenable to surgical or percutaneous revascularization. -Medications as noted above -Improved blood pressure control -Follow echo -Telemetry  #4.  Hypertension.  Pressure elevated on admission.  Home medications as noted above.  Evaluated by cardiology who opined she is on max dose beta-blocker and losartan and will consider increasing diltiazem once echo results back. -Follow echo -monitor closely  5.  Diabetes type II.  Uncontrolled.  Hemoglobin A1c 10 last month. Home meds include relion. CBGs running high.  -continue 70/30 -change SSI to obese/resistant -add SS hs coveragel -diabetes coordinator  #6.CKD 3. Chart review indicates baseline creatinine 1.2-1.3.  Creatinine at baseline on admission.  Creatinine trending up slightly this morning.  Urine output good. -Monitor  #7. Obesity. BMI 50 -  nutritional consult   Family Communication/Anticipated D/C date and plan/Code Status   DVT prophylaxis: Lovenox ordered. Code Status: Full Code.  Family Communication: patient Disposition  Plan: Status is: Inpatient  Remains inpatient appropriate because:IV treatments appropriate due to intensity of illness or inability to take PO   Dispo: The patient is from: Home              Anticipated d/c is to: Home              Anticipated d/c date is: 1 day              Patient currently is not medically stable to d/c.         Medical Consultants:    None.   Anti-Infectives:    None  Subjective:    Sitting on side of bed eating breakfast.  Denies pain or discomfort.  Reports breathing is "much better".  Objective:    Vitals:   01/23/20 2206 01/23/20 2240 01/24/20 0516 01/24/20 0839  BP:  129/86 (!) 144/74 (!) 172/88  Pulse:  95 74 70  Resp:  20  18  Temp: 98 F (36.7 C) 99.3 F (37.4 C) 98.3 F (36.8 C) 98.7 F (37.1 C)  TempSrc: Oral Oral Oral Oral  SpO2:  100% 97% 98%  Weight:  129.7 kg    Height:  5\' 3"  (1.6 m)      Intake/Output Summary (Last 24 hours) at 01/24/2020 0849 Last data filed at 01/24/2020 0500 Gross per 24 hour  Intake 240 ml  Output --  Net 240 ml   Filed Weights   01/23/20 2240  Weight: 129.7 kg    Exam: General: Obese no acute distress CV: Tachycardic but regular I hear no murmur gallop or rub trace bilateral lower extremity edema Respiratory: mild increased work of breathing with number station.  Good airflow.  I hear no wheeze no crackles Abdomen: Obese soft positive bowel sounds throughout nontender to palpation no guarding or rebounding Musculoskeletal: Joints without swelling/erythema full range of motion ambulating in room with steady gait Neuro: Alert and oriented x3 speech clear facial symmetry Psych: Calm cooperative  Data Reviewed:   I have personally reviewed following labs and imaging studies:  Labs: Labs show the following:   Basic Metabolic Panel: Recent Labs  Lab 01/23/20 1239 01/23/20 2329 01/24/20 0448  NA 139  --  138  K 4.2  --  4.7  CL 108  --  108  CO2 21*  --  22  GLUCOSE 266*  --   267*  BUN 10  --  12  CREATININE 1.16*  --  1.32*  CALCIUM 9.0  --  8.8*  MG  --  2.0 2.2   GFR Estimated Creatinine Clearance: 55.9 mL/min (A) (by C-G formula based on SCr of 1.32 mg/dL (H)). Liver Function Tests: No results for input(s): AST, ALT, ALKPHOS, BILITOT, PROT, ALBUMIN in the last 168 hours. No results for input(s): LIPASE, AMYLASE in the last 168 hours. No results for input(s): AMMONIA in the last 168 hours. Coagulation profile No results for input(s): INR, PROTIME in the last 168 hours.  CBC: Recent Labs  Lab 01/23/20 1239 01/24/20 0448  WBC 11.2* 10.5  HGB 12.5 11.6*  HCT 38.8 35.8*  MCV 96.3 95.7  PLT 241 226   Cardiac Enzymes: No results for input(s): CKTOTAL, CKMB, CKMBINDEX, TROPONINI in the last 168 hours. BNP (last 3 results) No results for input(s): PROBNP in the last 8760 hours. CBG:  Recent Labs  Lab 01/23/20 2328 01/24/20 0421 01/24/20 0616 01/24/20 0839  GLUCAP 329* 244* 220* 253*   D-Dimer: No results for input(s): DDIMER in the last 72 hours. Hgb A1c: No results for input(s): HGBA1C in the last 72 hours. Lipid Profile: Recent Labs    01/23/20 2329  CHOL 126  HDL 31*  LDLCALC 81  TRIG 69  CHOLHDL 4.1   Thyroid function studies: No results for input(s): TSH, T4TOTAL, T3FREE, THYROIDAB in the last 72 hours.  Invalid input(s): FREET3 Anemia work up: No results for input(s): VITAMINB12, FOLATE, FERRITIN, TIBC, IRON, RETICCTPCT in the last 72 hours. Sepsis Labs: Recent Labs  Lab 01/23/20 1239 01/24/20 0448  WBC 11.2* 10.5    Microbiology Recent Results (from the past 240 hour(s))  SARS Coronavirus 2 by RT PCR (hospital order, performed in [facility] hospital lab) Nasopharyngeal Nasopharyngeal Swab     Status: None   Collection Time: 01/23/20 10:00 PM   Specimen: Nasopharyngeal Swab  Result Value Ref Range Status   SARS Coronavirus 2 NEGATIVE NEGATIVE Final    Comment: (NOTE) SARS-CoV-2 target nucleic acids are NOT  DETECTED.  The SARS-CoV-2 RNA is generally detectable in upper and lower respiratory specimens during the acute phase of infection. The lowest concentration of SARS-CoV-2 viral copies this assay can detect is 250 copies / mL. A negative result does not preclude SARS-CoV-2 infection and should not be used as the sole basis for treatment or other patient management decisions.  A negative result may occur with improper specimen collection / handling, submission of specimen other than nasopharyngeal swab, presence of viral mutation(s) within the areas targeted by this assay, and inadequate number of viral copies (<250 copies / mL). A negative result must be combined with clinical observations, patient history, and epidemiological information.  Fact Sheet for Patients:   StrictlyIdeas.no  Fact Sheet for Healthcare Providers: BankingDealers.co.za  This test is not yet approved or  cleared by the [place] FDA and has been authorized for detection and/or diagnosis of SARS-CoV-2 by FDA under an Emergency Use Authorization (EUA).  This EUA will remain in effect (meaning this test can be used) for the duration of the COVID-19 declaration under Section 564(b)(1) of the Act, 21 U.S.C. section 360bbb-3(b)(1), unless the authorization is terminated or revoked sooner.  Performed at [facility], [place] [address], [place], [place] [address]     Procedures and diagnostic studies:  DG Chest 2 View  Result Date: 01/23/2020 CLINICAL DATA:  Chest pressure that began yesterday. Additional history provided: Chest pressure and shortness of breath for 2 days. History of CKD, CAD, CHF, GERD, hypertension. EXAM: CHEST - 2 VIEW COMPARISON:  Prior chest CT 09/20/2016, chest radiograph 09/09/2016 FINDINGS: Heart size within normal limits. No appreciable airspace consolidation within the lungs. No frank pulmonary edema. No evidence of pleural effusion or  pneumothorax. No acute bony abnormality identified. IMPRESSION: No evidence of acute cardiopulmonary abnormality. Please refer to prior chest CT 09/20/2016 for a description of previously demonstrated small pulmonary nodules and follow-up recommendations. Electronically Signed   By: Kellie Simmering DO   On: 01/23/2020 13:37    Medications:   . acetaZOLAMIDE  500 mg Oral BID  . aspirin EC  81 mg Oral Daily  . atorvastatin  80 mg Oral QPM  . brimonidine  1 drop Both Eyes BID   Or  . timolol  1 drop Both Eyes BID  . diltiazem  240 mg Oral Daily  . enoxaparin (LOVENOX) injection  40 mg Subcutaneous Q24H  . furosemide  80 mg Intravenous BID  . insulin aspart  0-15 Units Subcutaneous Q4H  . insulin aspart protamine- aspart  40 Units Subcutaneous BID WC  . isosorbide mononitrate  120 mg Oral Daily  . latanoprost  1 drop Right Eye QHS  . losartan  100 mg Oral Daily  . metoprolol  200 mg Oral Daily  . potassium chloride  40 mEq Oral Daily  . vitamin B-12  1,000 mcg Oral Daily   Continuous Infusions:   LOS: 1 day   Radene Gunning NP [facility]   How to contact the [facility] Attending or Consulting provider [place] or covering provider during after hours [place], for this patient?  1. Check the care team in [facility] and look for a) attending/consulting [facility] provider listed and b) the [facility] team listed 2. Log into [email or web address] and use [place]'s universal password to access. If you do not have the password, please contact the hospital operator. 3. Locate the [facility] provider you are looking for under [facility] and page to a number that you can be directly reached. 4. If you still have difficulty reaching the provider, please page the [facility] (Director on Call) for the Hospitalists listed on amion for assistance.  01/24/2020, 8:49 AM

## 2020-01-24 NOTE — Progress Notes (Addendum)
Progress Note  Patient Name: [name] Date of Encounter: 01/24/2020  Primary Cardiologist:  Sanda Klein, MD  Subjective   Breathing better, has gained wt, but does not weigh at home so not sure how much.   Inpatient Medications    Scheduled Meds: . acetaZOLAMIDE  500 mg Oral BID  . aspirin EC  81 mg Oral Daily  . atorvastatin  80 mg Oral QPM  . brimonidine  1 drop Both Eyes BID   Or  . timolol  1 drop Both Eyes BID  . diltiazem  240 mg Oral Daily  . enoxaparin (LOVENOX) injection  40 mg Subcutaneous Q24H  . furosemide  80 mg Intravenous BID  . insulin aspart  0-15 Units Subcutaneous Q4H  . insulin aspart protamine- aspart  40 Units Subcutaneous BID WC  . isosorbide mononitrate  120 mg Oral Daily  . latanoprost  1 drop Right Eye QHS  . losartan  100 mg Oral Daily  . metoprolol  200 mg Oral Daily  . potassium chloride  40 mEq Oral Daily  . vitamin B-12  1,000 mcg Oral Daily   Continuous Infusions:  PRN Meds: acetaminophen, bismuth subsalicylate, calcium carbonate   Vital Signs    Vitals:   01/23/20 2206 01/23/20 2240 01/24/20 0516 01/24/20 0839  BP:  129/86 (!) 144/74 (!) 172/88  Pulse:  95 74 70  Resp:  20  18  Temp: 98 F (36.7 C) 99.3 F (37.4 C) 98.3 F (36.8 C) 98.7 F (37.1 C)  TempSrc: Oral Oral Oral Oral  SpO2:  100% 97% 98%  Weight:  129.7 kg    Height:  5\' 3"  (1.6 m)      Intake/Output Summary (Last 24 hours) at 01/24/2020 1052 Last data filed at 01/24/2020 0500 Gross per 24 hour  Intake 240 ml  Output --  Net 240 ml   Filed Weights   01/23/20 2240  Weight: 129.7 kg   Last Weight  Most recent update: 01/23/2020 10:41 PM   Weight  129.7 kg (285 lb 15 oz)            Telemetry    SR - Personally Reviewed  ECG    None today - Personally Reviewed  Physical Exam   General: Well developed, obese, female appearing in no acute distress. Head: Normocephalic, atraumatic.  Neck: Supple without bruits, JVD approx 9 cm. Lungs:   Resp regular and unlabored, decreased BS bases w/ few rales Heart: RRR, S1, S2, no S3, S4, or murmur; no rub. Abdomen: Soft, non-tender, non-distended with normoactive bowel sounds. No hepatomegaly. No rebound/guarding. No obvious abdominal masses. Extremities: No clubbing, cyanosis, trace-1+ LE edema. Distal pedal pulses are 2+ bilaterally. Neuro: Alert and oriented X 3. Moves all extremities spontaneously. Psych: Normal affect.  Labs    Hematology Recent Labs  Lab 01/23/20 1239 01/24/20 0448  WBC 11.2* 10.5  RBC 4.03 3.74*  HGB 12.5 11.6*  HCT 38.8 35.8*  MCV 96.3 95.7  MCH 31.0 31.0  MCHC 32.2 32.4  RDW 13.8 13.7  PLT 241 226    Chemistry Recent Labs  Lab 01/23/20 1239 01/24/20 0448  NA 139 138  K 4.2 4.7  CL 108 108  CO2 21* 22  GLUCOSE 266* 267*  BUN 10 12  CREATININE 1.16* 1.32*  CALCIUM 9.0 8.8*  GFRNONAA 49* 42*  GFRAA 57* 49*  ANIONGAP 10 8     High Sensitivity Troponin:   Recent Labs  Lab 01/23/20 1239 01/23/20 1523 01/23/20  2329 01/24/20 0448  TROPONINIHS 6 26* 13 17      BNP Recent Labs  Lab 01/23/20 2329  BNP 27.2    Lab Results  Component Value Date   HGBA1C 10.0 (A) 12/24/2019   Lab Results  Component Value Date   CHOL 126 01/23/2020   HDL 31 (L) 01/23/2020   LDLCALC 81 01/23/2020   TRIG 69 01/23/2020   CHOLHDL 4.1 01/23/2020   Lab Results  Component Value Date   ALT 8 12/24/2019   AST 10 12/24/2019   ALKPHOS 59 12/24/2019   BILITOT 0.4 12/24/2019   Lab Results  Component Value Date   TSH 0.575 03/14/2013    Radiology    DG Chest 2 View  Result Date: 01/23/2020 CLINICAL DATA:  Chest pressure that began yesterday. Additional history provided: Chest pressure and shortness of breath for 2 days. History of CKD, CAD, CHF, GERD, hypertension. EXAM: CHEST - 2 VIEW COMPARISON:  Prior chest CT 09/20/2016, chest radiograph 09/09/2016 FINDINGS: Heart size within normal limits. No appreciable airspace consolidation within the  lungs. No frank pulmonary edema. No evidence of pleural effusion or pneumothorax. No acute bony abnormality identified. IMPRESSION: No evidence of acute cardiopulmonary abnormality. Please refer to prior chest CT 09/20/2016 for a description of previously demonstrated small pulmonary nodules and follow-up recommendations. Electronically Signed   By: Kellie Simmering DO   On: 01/23/2020 13:37   Cardiac Studies   ECHO:  ordered  Patient Profile     64 y.o. female w/ hx  CAD and HFpEF who was admitted 06/23 for accelerating angina and CHF, cards asked to see.  Assessment & Plan    1. Acute on chronic diastolic CHF - I/O not recorded - wt not checked, will contact the nurse about this - continue IV Lasix for now - ambulate tomorrow and ck sats w/ ambulation, may qualify for home O2  2. CAD - minimal elevation of troponin more c/w demand ischemia from CHF, HTN - known CAD not amenable to surgery/PCI - continue current therapy  3. CKD III - follow renal function w/ diuresis - slight Cr increase overnight, K+ ok  4. OSA - s/p remote eval and was told it was mild, not on CPAP - consider re-eval for OSA as outpt  Otherwise, per IM Principal Problem:   Acute respiratory failure with hypoxia ([place]) Active Problems:   Essential hypertension   Severe obesity (BMI >= 40) (HCC)   Acute on chronic diastolic heart failure (HCC)   Dyslipidemia   Prolonged Q-T interval on ECG   Chronic renal insufficiency, stage III (moderate)   Controlled type 2 diabetes mellitus with complication, with long-term current use of insulin ([place])   Legally blind in right eye, as defined in [place]   Chest pain    Signed, Rosaria Ferries , PA-C 10:52 AM 01/24/2020 Pager: [phone]  I have seen and examined the patient along with Rosaria Ferries , PA-C.  I have reviewed the chart, notes and new data.  I agree with PA/NP's note.  Key new complaints: feels better. No angina or dyspnea (has not really walked yet,  but up in chair) Key examination changes: weight down 6 lb. Volume status assessment is hard due to obesity. She is no longer tachypneic. Clear lungs. Mild nonpitting ankle edema. BP now very well controlled. Key new findings / data: slight increase in creatinine, but her baseline is 1.2-1.3.  BNP is high, but higher than before and difficult to interpret w super-obesity.  PLAN:  Continue diuretics today.  Sanda Klein, MD, [place] [phone] 01/24/2020, 11:57 AM

## 2020-01-24 NOTE — Progress Notes (Signed)
Echocardiogram 2D Echocardiogram has been performed.  Oneal Deputy Elton Catalano 01/24/2020, 4:22 PM

## 2020-01-25 ENCOUNTER — Telehealth (HOSPITAL_COMMUNITY): Payer: Self-pay | Admitting: Licensed Clinical Social Worker

## 2020-01-25 LAB — HEMOGLOBIN A1C
Hgb A1c MFr Bld: 10.1 % — ABNORMAL HIGH (ref 4.8–5.6)
Mean Plasma Glucose: 243.17 mg/dL

## 2020-01-25 LAB — BASIC METABOLIC PANEL
Anion gap: 10 (ref 5–15)
BUN: 14 mg/dL (ref 8–23)
CO2: 19 mmol/L — ABNORMAL LOW (ref 22–32)
Calcium: 8.7 mg/dL — ABNORMAL LOW (ref 8.9–10.3)
Chloride: 110 mmol/L (ref 98–111)
Creatinine, Ser: 1.25 mg/dL — ABNORMAL HIGH (ref 0.44–1.00)
GFR calc Af Amer: 52 mL/min — ABNORMAL LOW (ref >60)
GFR calc non Af Amer: 45 mL/min — ABNORMAL LOW (ref >60)
Glucose, Bld: 173 mg/dL — ABNORMAL HIGH (ref 70–99)
Potassium: 3.5 mmol/L (ref 3.5–5.1)
Sodium: 139 mmol/L (ref 135–145)

## 2020-01-25 LAB — GLUCOSE, CAPILLARY
Glucose-Capillary: 169 mg/dL — ABNORMAL HIGH (ref 70–99)
Glucose-Capillary: 184 mg/dL — ABNORMAL HIGH (ref 70–99)
Glucose-Capillary: 247 mg/dL — ABNORMAL HIGH (ref 70–99)
Glucose-Capillary: 170 mg/dL — ABNORMAL HIGH (ref 70–99)

## 2020-01-25 LAB — MAGNESIUM: Magnesium: 2.1 mg/dL (ref 1.7–2.4)

## 2020-01-25 MED ORDER — POTASSIUM CHLORIDE CRYS ER 20 MEQ PO TBCR
40.0000 meq | EXTENDED_RELEASE_TABLET | Freq: Once | ORAL | Status: AC
  Administered 2020-01-25: 40 meq via ORAL
  Filled 2020-01-25: qty 2

## 2020-01-25 NOTE — Progress Notes (Signed)
PROGRESS NOTE  [name] CNO:[id number] DOB: April 01, 1956 DOA: 01/23/2020 PCP: Ladell Pier, MD  HPI/Recap of past 24 hours: [name] is a very pleasant 64 y.o. female with a past medical history that includes CAD, diastolic heart failure, exertional angina pectoris, diabetes, chronic kidney disease, morbid obesity admitted June 23 with acute respiratory failure with hypoxia secondary to acute on chronic diastolic heart failure and chest pain and hypertension.  Was provided with 40 mg of Lasix IV in the emergency department.  She was also evaluated by cardiology who recommend increasing IV Lasix and further work-up.  01/25/20: Seen and examined at her bedside this morning.  No acute events overnight.  She reports her breathing is improved.  Ongoing diuresing.  Net I&O -210 cc.  Assessment/Plan: Principal Problem:   Acute respiratory failure with hypoxia (HCC) Active Problems:   Essential hypertension   Severe obesity (BMI >= 40) (HCC)   Acute on chronic diastolic heart failure (HCC)   Dyslipidemia   Prolonged Q-T interval on ECG   Chronic renal insufficiency, stage III (moderate)   Controlled type 2 diabetes mellitus with complication, with long-term current use of insulin ([place])   Legally blind in right eye, as defined in [place]   Chest pain   Acute hypoxic respiratory failure with hypoxia secondary to acute on chronic diastolic heart failure in the setting of uncontrolled blood pressure.   Presented with O2 saturation of 88% on room air.   Chest x-ray no evidence of acute cardiopulmonary abnormality, BNP 27 Tachycardic with uncontrolled hypertension on presentation which may have contributed to her acute hypoxia O2 saturation improved with diuresis IV Lasix 80 mg twice daily.   Currently with O2 saturation 100% on room air. Of note home Lasix dose is 120 mg twice daily.  Ongoing diuresing  Acute on chronic diastolic congestive heart failure.   Ongoing diuresing as  stated above 2D echo done on 01/24/2020 showed preserved LVEF 65 to 70% with grade 2 diastolic dysfunction Continue strict I's and O's and daily weight Cardiology following, appreciate recommendations.  Resolved chest pain.   Denies any anginal symptoms at the time of this visit Patient with a history of angina pectoris, CAD.   EKG with sinus tachycardia and right bundle branch block, hs trop 6>>26>13>17. Evaluated by cards who opine likely ischemia exacerbation related to uncontrolled blood pressure.  Of note cards indicates angina pectoris well controlled in the past and she has extensive CAD not amenable to surgical or percutaneous revascularization. Continue home cardiac medications.  Essential hypertension.   Blood pressure is at goal  Continue home medications  Continue to monitor vital signs   Diabetes type II uncontrolled with hyperglycemia. Hemoglobin A1c 10.1 on 01/25/2020 Continue subcu insulin coverage   CKD 3A.  She appears to be at her baseline creatinine 1.2 with GFR 52  Continue to avoid nephrotoxins and hypotension Continue to monitor urine output  Severe obesity.  BMI 49 Will need weight loss outpatient with regular physical activity and healthy dieting.   Family Communication/Anticipated D/C date and plan/Code Status   DVT prophylaxis: Lovenox subcu daily Code Status: Full Code.  Family Communication:  None at bedside. Disposition Plan:   Status is: Inpatient  Remains inpatient appropriate because:IV treatments appropriate due to intensity of illness, ongoing diuresing with IV Lasix.   Dispo: The patient is from: Home  Anticipated d/c is to: Home  Anticipated d/c date is: 01/26/2020.  Patient currently is not medically stable to d/c due to  ongoing IV diuresis..      Objective: Vitals:   01/25/20 0041 01/25/20 0627 01/25/20 0946 01/25/20 1246  BP: 137/72 (!) 150/66 135/62 (!) 102/57  Pulse: 76 63 72  67  Resp: 17 16  16   Temp: 98 F (36.7 C) 98.1 F (36.7 C)  98.2 F (36.8 C)  TempSrc: Oral Oral  Oral  SpO2: 98% 98%  100%  Weight:  125.9 kg    Height:        Intake/Output Summary (Last 24 hours) at 01/25/2020 1417 Last data filed at 01/25/2020 4132 Gross per 24 hour  Intake 360 ml  Output 750 ml  Net -390 ml   Filed Weights   01/23/20 2240 01/24/20 1144 01/25/20 0627  Weight: 129.7 kg 126.8 kg 125.9 kg    Exam:  . General: 64 y.o. year-old female well developed well nourished in no acute distress.  Alert and oriented x3. . Cardiovascular: Regular rate and rhythm with no rubs or gallops.  No thyromegaly or JVD noted.   Marland Kitchen Respiratory: Mild rales at bases no wheezing noted.  Good inspiratory effort. . Abdomen: Obese nontender nondistended with normal bowel sounds x4 quadrants. . Musculoskeletal: Trace lower extremity edema bilaterally. Marland Kitchen Psychiatry: Mood is appropriate for condition and setting   Data Reviewed: CBC: Recent Labs  Lab 01/23/20 1239 01/24/20 0448  WBC 11.2* 10.5  HGB 12.5 11.6*  HCT 38.8 35.8*  MCV 96.3 95.7  PLT 241 440   Basic Metabolic Panel: Recent Labs  Lab 01/23/20 1239 01/23/20 2329 01/24/20 0448 01/25/20 0542  NA 139  --  138 139  K 4.2  --  4.7 3.5  CL 108  --  108 110  CO2 21*  --  22 19*  GLUCOSE 266*  --  267* 173*  BUN 10  --  12 14  CREATININE 1.16*  --  1.32* 1.25*  CALCIUM 9.0  --  8.8* 8.7*  MG  --  2.0 2.2 2.1   GFR: Estimated Creatinine Clearance: 57.9 mL/min (A) (by C-G formula based on SCr of 1.25 mg/dL (H)). Liver Function Tests: No results for input(s): AST, ALT, ALKPHOS, BILITOT, PROT, ALBUMIN in the last 168 hours. No results for input(s): LIPASE, AMYLASE in the last 168 hours. No results for input(s): AMMONIA in the last 168 hours. Coagulation Profile: No results for input(s): INR, PROTIME in the last 168 hours. Cardiac Enzymes: No results for input(s): CKTOTAL, CKMB, CKMBINDEX, TROPONINI in the last 168  hours. BNP (last 3 results) No results for input(s): PROBNP in the last 8760 hours. HbA1C: Recent Labs    01/25/20 0542  HGBA1C 10.1*   CBG: Recent Labs  Lab 01/24/20 1117 01/24/20 1506 01/24/20 2154 01/25/20 0630 01/25/20 1106  GLUCAP 226* 228* 169* 169* 184*   Lipid Profile: Recent Labs    01/23/20 2329  CHOL 126  HDL 31*  LDLCALC 81  TRIG 69  CHOLHDL 4.1   Thyroid Function Tests: No results for input(s): TSH, T4TOTAL, FREET4, T3FREE, THYROIDAB in the last 72 hours. Anemia Panel: No results for input(s): VITAMINB12, FOLATE, FERRITIN, TIBC, IRON, RETICCTPCT in the last 72 hours. Urine analysis:    Component Value Date/Time   COLORURINE RED (A) 01/15/2019 2010   APPEARANCEUR TURBID (A) 01/15/2019 2010   LABSPEC  01/15/2019 2010    TEST NOT REPORTED DUE TO COLOR INTERFERENCE OF URINE PIGMENT   PHURINE  01/15/2019 2010    TEST NOT REPORTED DUE TO COLOR INTERFERENCE OF URINE PIGMENT   GLUCOSEU (  A) 01/15/2019 2010    TEST NOT REPORTED DUE TO COLOR INTERFERENCE OF URINE PIGMENT   HGBUR (A) 01/15/2019 2010    TEST NOT REPORTED DUE TO COLOR INTERFERENCE OF URINE PIGMENT   HGBUR large 04/03/2010 0931   BILIRUBINUR (A) 01/15/2019 2010    TEST NOT REPORTED DUE TO COLOR INTERFERENCE OF URINE PIGMENT   KETONESUR (A) 01/15/2019 2010    TEST NOT REPORTED DUE TO COLOR INTERFERENCE OF URINE PIGMENT   PROTEINUR (A) 01/15/2019 2010    TEST NOT REPORTED DUE TO COLOR INTERFERENCE OF URINE PIGMENT   UROBILINOGEN 0.2 03/22/2013 1159   NITRITE (A) 01/15/2019 2010    TEST NOT REPORTED DUE TO COLOR INTERFERENCE OF URINE PIGMENT   LEUKOCYTESUR (A) 01/15/2019 2010    TEST NOT REPORTED DUE TO COLOR INTERFERENCE OF URINE PIGMENT   Sepsis Labs: @LABRCNTIP (procalcitonin:4,lacticidven:4)  ) Recent Results (from the past 240 hour(s))  SARS Coronavirus 2 by RT PCR (hospital order, performed in [place] hospital lab) Nasopharyngeal Nasopharyngeal Swab     Status: None   Collection  Time: 01/23/20 10:00 PM   Specimen: Nasopharyngeal Swab  Result Value Ref Range Status   SARS Coronavirus 2 NEGATIVE NEGATIVE Final    Comment: (NOTE) SARS-CoV-2 target nucleic acids are NOT DETECTED.  The SARS-CoV-2 RNA is generally detectable in upper and lower respiratory specimens during the acute phase of infection. The lowest concentration of SARS-CoV-2 viral copies this assay can detect is 250 copies / mL. A negative result does not preclude SARS-CoV-2 infection and should not be used as the sole basis for treatment or other patient management decisions.  A negative result may occur with improper specimen collection / handling, submission of specimen other than nasopharyngeal swab, presence of viral mutation(s) within the areas targeted by this assay, and inadequate number of viral copies (<250 copies / mL). A negative result must be combined with clinical observations, patient history, and epidemiological information.  Fact Sheet for Patients:   StrictlyIdeas.no  Fact Sheet for Healthcare Providers: BankingDealers.co.za  This test is not yet approved or  cleared by the [place] FDA and has been authorized for detection and/or diagnosis of SARS-CoV-2 by FDA under an Emergency Use Authorization (EUA).  This EUA will remain in effect (meaning this test can be used) for the duration of the COVID-19 declaration under Section 564(b)(1) of the Act, 21 U.S.C. section 360bbb-3(b)(1), unless the authorization is terminated or revoked sooner.  Performed at [facility], [place] [address], [place], [place] [address]       Studies: ECHOCARDIOGRAM COMPLETE  Result Date: 01/24/2020    ECHOCARDIOGRAM REPORT   Patient Name:   AHNNA DUNGAN Date of Exam: 01/24/2020 Medical Rec #:  [id number]        Height:       63.0 in Accession #:    [id number]       Weight:       279.5 lb Date of Birth:  June 09, 1956        BSA:          2.229 m  Patient Age:    70 years         BP:           127/42 mmHg Patient Gender: F                HR:           62 bpm. Exam Location:  Inpatient Procedure: 2D Echo, Color Doppler and Cardiac  Doppler Indications:    I50.9* Heart failure (unspecified)  History:        Patient has prior history of Echocardiogram examinations, most                 recent 02/10/2016. CHF, NSTEMI; Risk Factors:Hypertension,                 Diabetes, Dyslipidemia and Sleep Apnea.  Sonographer:    Raquel Sarna Senior RDCS Referring Phys: [id number] [place] N FAIR  Sonographer Comments: Technically difficult study due to patient body habitus. IMPRESSIONS  1. Left ventricular ejection fraction, by estimation, is 65 to 70%. The left ventricle has normal function. The left ventricle has no regional wall motion abnormalities. There is mild left ventricular hypertrophy. Left ventricular diastolic parameters are consistent with Grade II diastolic dysfunction (pseudonormalization). Elevated left atrial pressure.  2. Right ventricular systolic function is normal. The right ventricular size is normal. There is normal pulmonary artery systolic pressure. The estimated right ventricular systolic pressure is 98.1 mmHg.  3. The mitral valve is normal in structure. No evidence of mitral valve regurgitation.  4. The aortic valve was not well visualized. Aortic valve regurgitation is not visualized. No aortic stenosis is present.  5. The inferior vena cava is normal in size with <50% respiratory variability, suggesting right atrial pressure of 8 mmHg. FINDINGS  Left Ventricle: Left ventricular ejection fraction, by estimation, is 65 to 70%. The left ventricle has normal function. The left ventricle has no regional wall motion abnormalities. The left ventricular internal cavity size was normal in size. There is  mild left ventricular hypertrophy. Left ventricular diastolic parameters are consistent with Grade II diastolic dysfunction (pseudonormalization). Elevated left  atrial pressure. Right Ventricle: The right ventricular size is normal. Right vetricular wall thickness was not assessed. Right ventricular systolic function is normal. There is normal pulmonary artery systolic pressure. The tricuspid regurgitant velocity is 2.61 m/s, and with an assumed right atrial pressure of 8 mmHg, the estimated right ventricular systolic pressure is 19.1 mmHg. Left Atrium: Left atrial size was normal in size. Right Atrium: Right atrial size was not well visualized. Pericardium: There is no evidence of pericardial effusion. Mitral Valve: The mitral valve is normal in structure. No evidence of mitral valve regurgitation. Tricuspid Valve: The tricuspid valve is normal in structure. Tricuspid valve regurgitation is trivial. Aortic Valve: The aortic valve was not well visualized. Aortic valve regurgitation is not visualized. No aortic stenosis is present. Pulmonic Valve: The pulmonic valve was not well visualized. Pulmonic valve regurgitation is not visualized. Aorta: The aortic root is normal in size and structure. Venous: The inferior vena cava is normal in size with less than 50% respiratory variability, suggesting right atrial pressure of 8 mmHg. IAS/Shunts: The interatrial septum was not well visualized.  LEFT VENTRICLE PLAX 2D LVIDd:         3.80 cm  Diastology LVIDs:         2.00 cm  LV e' lateral:   5.55 cm/s LV PW:         1.20 cm  LV E/e' lateral: 18.7 LV IVS:        1.10 cm  LV e' medial:    5.44 cm/s LVOT diam:     2.10 cm  LV E/e' medial:  19.1 LV SV:         77 LV SV Index:   34 LVOT Area:     3.46 cm  RIGHT VENTRICLE RV S prime:  9.36 cm/s TAPSE (M-mode): 2.0 cm LEFT ATRIUM             Index LA diam:        3.20 cm 1.44 cm/m LA Vol (A2C):   49.1 ml 22.03 ml/m LA Vol (A4C):   69.2 ml 31.04 ml/m LA Biplane Vol: 59.6 ml 26.74 ml/m  AORTIC VALVE LVOT Vmax:   88.10 cm/s LVOT Vmean:  59.200 cm/s LVOT VTI:    0.222 m  AORTA Ao Root diam: 3.00 cm Ao Asc diam:  3.50 cm MITRAL VALVE                 TRICUSPID VALVE MV Area (PHT): 3.31 cm     TR Peak grad:   27.2 mmHg MV Decel Time: 229 msec     TR Vmax:        261.00 cm/s MV E velocity: 104.00 cm/s MV A velocity: 102.00 cm/s  SHUNTS MV E/A ratio:  1.02         Systemic VTI:  0.22 m                             Systemic Diam: 2.10 cm Oswaldo Milian MD Electronically signed by Oswaldo Milian MD Signature Date/Time: 01/24/2020/8:53:23 PM    Final     Scheduled Meds: . acetaZOLAMIDE  500 mg Oral BID  . aspirin EC  81 mg Oral Daily  . atorvastatin  80 mg Oral QPM  . brimonidine  1 drop Both Eyes BID   Or  . timolol  1 drop Both Eyes BID  . diltiazem  240 mg Oral Daily  . enoxaparin (LOVENOX) injection  40 mg Subcutaneous Q24H  . furosemide  80 mg Intravenous BID  . insulin aspart  0-20 Units Subcutaneous TID WC  . insulin aspart  0-5 Units Subcutaneous QHS  . insulin aspart protamine- aspart  40 Units Subcutaneous BID WC  . isosorbide mononitrate  120 mg Oral Daily  . latanoprost  1 drop Right Eye QHS  . losartan  100 mg Oral Daily  . metoprolol  200 mg Oral Daily  . potassium chloride  40 mEq Oral Daily  . vitamin B-12  1,000 mcg Oral Daily    Continuous Infusions:   LOS: 2 days     Kayleen Memos, MD [facility] Pager [phone]  If 7PM-7AM, please contact night-coverage [email or web address] Password [facility] 01/25/2020, 2:17 PM

## 2020-01-25 NOTE — Progress Notes (Addendum)
Progress Note  Patient Name: [name] Date of Encounter: 01/25/2020  Primary Cardiologist:  Sanda Klein, MD  Subjective   Feels like she is breathing fine, wonders when she can go home.  Inpatient Medications    Scheduled Meds:  acetaZOLAMIDE  500 mg Oral BID   aspirin EC  81 mg Oral Daily   atorvastatin  80 mg Oral QPM   brimonidine  1 drop Both Eyes BID   Or   timolol  1 drop Both Eyes BID   diltiazem  240 mg Oral Daily   enoxaparin (LOVENOX) injection  40 mg Subcutaneous Q24H   furosemide  80 mg Intravenous BID   insulin aspart  0-20 Units Subcutaneous TID WC   insulin aspart  0-5 Units Subcutaneous QHS   insulin aspart protamine- aspart  40 Units Subcutaneous BID WC   isosorbide mononitrate  120 mg Oral Daily   latanoprost  1 drop Right Eye QHS   losartan  100 mg Oral Daily   metoprolol  200 mg Oral Daily   potassium chloride  40 mEq Oral Daily   vitamin B-12  1,000 mcg Oral Daily   Continuous Infusions:   PRN Meds: acetaminophen, bismuth subsalicylate, calcium carbonate   Vital Signs    Vitals:   01/24/20 1144 01/24/20 1516 01/25/20 0041 01/25/20 0627  BP: (!) 127/42 (!) 122/50 137/72 (!) 150/66  Pulse: 71 64 76 63  Resp: 18 18 17 16   Temp: 98.3 F (36.8 C) 98.3 F (36.8 C) 98 F (36.7 C) 98.1 F (36.7 C)  TempSrc: Oral Oral Oral Oral  SpO2: 98% 100% 98% 98%  Weight: 126.8 kg   125.9 kg  Height:        Intake/Output Summary (Last 24 hours) at 01/25/2020 0823 Last data filed at 01/25/2020 2505 Gross per 24 hour  Intake 600 ml  Output 1050 ml  Net -450 ml   Filed Weights   01/23/20 2240 01/24/20 1144 01/25/20 0627  Weight: 129.7 kg 126.8 kg 125.9 kg   Last Weight  Most recent update: 01/25/2020  6:57 AM    Weight  125.9 kg (277 lb 9 oz)             Telemetry    Sinus rhythm, occasional PVCs- Personally Reviewed  ECG    None today - Personally Reviewed  Physical Exam   GEN: No acute distress.   Neck:  Mild JVD seen,  difficult to assess secondary to body habitus Cardiac: RRR, no murmur, no rubs, or gallops.  Respiratory: diminished to auscultation bilaterally. GI: Soft, nontender, non-distended  MS: No edema; No deformity. Neuro:  Nonfocal  Psych: Normal affect   Labs    Hematology Recent Labs  Lab 01/23/20 1239 01/24/20 0448  WBC 11.2* 10.5  RBC 4.03 3.74*  HGB 12.5 11.6*  HCT 38.8 35.8*  MCV 96.3 95.7  MCH 31.0 31.0  MCHC 32.2 32.4  RDW 13.8 13.7  PLT 241 226    Chemistry Recent Labs  Lab 01/23/20 1239 01/24/20 0448 01/25/20 0542  NA 139 138 139  K 4.2 4.7 3.5  CL 108 108 110  CO2 21* 22 19*  GLUCOSE 266* 267* 173*  BUN 10 12 14   CREATININE 1.16* 1.32* 1.25*  CALCIUM 9.0 8.8* 8.7*  GFRNONAA 49* 42* 45*  GFRAA 57* 49* 52*  ANIONGAP 10 8 10      High Sensitivity Troponin:   Recent Labs  Lab 01/23/20 1239 01/23/20 1523 01/23/20 2329 01/24/20 0448  TROPONINIHS  6 26* 13 17      BNP Recent Labs  Lab 01/23/20 2329  BNP 27.2    Lab Results  Component Value Date   HGBA1C 10.1 (H) 01/25/2020   Lab Results  Component Value Date   CHOL 126 01/23/2020   HDL 31 (L) 01/23/2020   LDLCALC 81 01/23/2020   TRIG 69 01/23/2020   CHOLHDL 4.1 01/23/2020   Lab Results  Component Value Date   ALT 8 12/24/2019   AST 10 12/24/2019   ALKPHOS 59 12/24/2019   BILITOT 0.4 12/24/2019   Lab Results  Component Value Date   TSH 0.575 03/14/2013    Radiology    DG Chest 2 View  Result Date: 01/23/2020 CLINICAL DATA:  Chest pressure that began yesterday. Additional history provided: Chest pressure and shortness of breath for 2 days. History of CKD, CAD, CHF, GERD, hypertension. EXAM: CHEST - 2 VIEW COMPARISON:  Prior chest CT 09/20/2016, chest radiograph 09/09/2016 FINDINGS: Heart size within normal limits. No appreciable airspace consolidation within the lungs. No frank pulmonary edema. No evidence of pleural effusion or pneumothorax. No acute bony abnormality identified.  IMPRESSION: No evidence of acute cardiopulmonary abnormality. Please refer to prior chest CT 09/20/2016 for a description of previously demonstrated small pulmonary nodules and follow-up recommendations. Electronically Signed   By: Kellie Simmering DO   On: 01/23/2020 13:37   ECHOCARDIOGRAM COMPLETE  Result Date: 01/24/2020    ECHOCARDIOGRAM REPORT   Patient Name:   [name] Date of Exam: 01/24/2020 Medical Rec #:  [id number]        Height:       63.0 in Accession #:    [id number]       Weight:       279.5 lb Date of Birth:  Jan 12, 1956        BSA:          2.229 m Patient Age:    64 years         BP:           127/42 mmHg Patient Gender: F                HR:           62 bpm. Exam Location:  Inpatient Procedure: 2D Echo, Color Doppler and Cardiac Doppler Indications:    I50.9* Heart failure (unspecified)  History:        Patient has prior history of Echocardiogram examinations, most                 recent 02/10/2016. CHF, NSTEMI; Risk Factors:Hypertension,                 Diabetes, Dyslipidemia and Sleep Apnea.  Sonographer:    Raquel Sarna Senior RDCS Referring Phys: [id number] [place] [facility]IR  Sonographer Comments: Technically difficult study due to patient body habitus. IMPRESSIONS  1. Left ventricular ejection fraction, by estimation, is 65 to 70%. The left ventricle has normal function. The left ventricle has no regional wall motion abnormalities. There is mild left ventricular hypertrophy. Left ventricular diastolic parameters are consistent with Grade II diastolic dysfunction (pseudonormalization). Elevated left atrial pressure.  2. Right ventricular systolic function is normal. The right ventricular size is normal. There is normal pulmonary artery systolic pressure. The estimated right ventricular systolic pressure is 96.2 mmHg.  3. The mitral valve is normal in structure. No evidence of mitral valve regurgitation.  4. The aortic valve was not well visualized. Aortic valve regurgitation is not  visualized. No  aortic stenosis is present.  5. The inferior vena cava is normal in size with <50% respiratory variability, suggesting right atrial pressure of 8 mmHg. FINDINGS  Left Ventricle: Left ventricular ejection fraction, by estimation, is 65 to 70%. The left ventricle has normal function. The left ventricle has no regional wall motion abnormalities. The left ventricular internal cavity size was normal in size. There is  mild left ventricular hypertrophy. Left ventricular diastolic parameters are consistent with Grade II diastolic dysfunction (pseudonormalization). Elevated left atrial pressure. Right Ventricle: The right ventricular size is normal. Right vetricular wall thickness was not assessed. Right ventricular systolic function is normal. There is normal pulmonary artery systolic pressure. The tricuspid regurgitant velocity is 2.61 m/s, and with an assumed right atrial pressure of 8 mmHg, the estimated right ventricular systolic pressure is 63.1 mmHg. Left Atrium: Left atrial size was normal in size. Right Atrium: Right atrial size was not well visualized. Pericardium: There is no evidence of pericardial effusion. Mitral Valve: The mitral valve is normal in structure. No evidence of mitral valve regurgitation. Tricuspid Valve: The tricuspid valve is normal in structure. Tricuspid valve regurgitation is trivial. Aortic Valve: The aortic valve was not well visualized. Aortic valve regurgitation is not visualized. No aortic stenosis is present. Pulmonic Valve: The pulmonic valve was not well visualized. Pulmonic valve regurgitation is not visualized. Aorta: The aortic root is normal in size and structure. Venous: The inferior vena cava is normal in size with less than 50% respiratory variability, suggesting right atrial pressure of 8 mmHg. IAS/Shunts: The interatrial septum was not well visualized.  LEFT VENTRICLE PLAX 2D LVIDd:         3.80 cm  Diastology LVIDs:         2.00 cm  LV e' lateral:   5.55 cm/s LV PW:          1.20 cm  LV E/e' lateral: 18.7 LV IVS:        1.10 cm  LV e' medial:    5.44 cm/s LVOT diam:     2.10 cm  LV E/e' medial:  19.1 LV SV:         77 LV SV Index:   34 LVOT Area:     3.46 cm  RIGHT VENTRICLE RV S prime:     9.36 cm/s TAPSE (M-mode): 2.0 cm LEFT ATRIUM             Index LA diam:        3.20 cm 1.44 cm/m LA Vol (A2C):   49.1 ml 22.03 ml/m LA Vol (A4C):   69.2 ml 31.04 ml/m LA Biplane Vol: 59.6 ml 26.74 ml/m  AORTIC VALVE LVOT Vmax:   88.10 cm/s LVOT Vmean:  59.200 cm/s LVOT VTI:    0.222 m  AORTA Ao Root diam: 3.00 cm Ao Asc diam:  3.50 cm MITRAL VALVE                TRICUSPID VALVE MV Area (PHT): 3.31 cm     TR Peak grad:   27.2 mmHg MV Decel Time: 229 msec     TR Vmax:        261.00 cm/s MV E velocity: 104.00 cm/s MV A velocity: 102.00 cm/s  SHUNTS MV E/A ratio:  1.02         Systemic VTI:  0.22 m  Systemic Diam: 2.10 cm Oswaldo Milian MD Electronically signed by Oswaldo Milian MD Signature Date/Time: 01/24/2020/8:53:23 PM    Final    Cardiac Studies   ECHO:  01/24/2020  1. Left ventricular ejection fraction, by estimation, is 65 to 70%. The  left ventricle has normal function. The left ventricle has no regional  wall motion abnormalities. There is mild left ventricular hypertrophy.  Left ventricular diastolic parameters  are consistent with Grade II diastolic dysfunction (pseudonormalization).  Elevated left atrial pressure.   2. Right ventricular systolic function is normal. The right ventricular  size is normal. There is normal pulmonary artery systolic pressure. The  estimated right ventricular systolic pressure is 75.9 mmHg.   3. The mitral valve is normal in structure. No evidence of mitral valve  regurgitation.   4. The aortic valve was not well visualized. Aortic valve regurgitation  is not visualized. No aortic stenosis is present.   5. The inferior vena cava is normal in size with <50% respiratory  variability, suggesting right  atrial pressure of 8 mmHg.   Patient Profile     64 y.o. female w/ hx  CAD and HFpEF who was admitted 06/23 for accelerating angina and CHF, cards asked to see.  Assessment & Plan    1. Acute on chronic diastolic CHF - I/O incomplete  - wt down 8 lbs - RA pressure 8 mmHg on echo, continue diuresis - needs to ambulate  2. CAD - mild trop elevation w/ known CAD (no PCI/CABG possible) - no ongoing ischemic sx - continue current therapy  3. CKD III - BUN up slightly, Cr stable, slightly lower than yesterday - K+ 4.7>>3.5, despite getting 40 mEq.  Will give another 40 mEq  4. OSA - she should be evaluated for this as outpt  Otherwise, per IM Principal Problem:   Acute respiratory failure with hypoxia ([place]) Active Problems:   Essential hypertension   Severe obesity (BMI >= 40) (HCC)   Acute on chronic diastolic heart failure (HCC)   Dyslipidemia   Prolonged Q-T interval on ECG   Chronic renal insufficiency, stage III (moderate)   Controlled type 2 diabetes mellitus with complication, with long-term current use of insulin ([place])   Legally blind in right eye, as defined in [place]   Chest pain    Signed, Rosaria Ferries , PA-C 8:23 AM 01/25/2020 Pager: [phone]  I have seen and examined the patient along with Rosaria Ferries , PA-C, PA NP.  I have reviewed the chart, notes and new data.  I agree with PA/NP's note.  Key new complaints: feels back to normal Key examination changes: clear lungs, weight down total 8 lb Key new findings / data: creat essentially unchanged  PLAN: DC home with previous meds (furosemide 120mg  twice daily). Reinforced sodium restriction. Daily weight monitoring will be very important to avoid recurrent symptoms and reshospitalization. We got her a scale from the HF clinic and she will be mailed a BP cuff. Call office if weight increases by 3 lb in 24h or 5 lb in a week.  Sanda Klein, MD, [place] [phone] 01/25/2020, 12:28  PM

## 2020-01-25 NOTE — Evaluation (Signed)
Physical Therapy Evaluation Patient Details Name: [name] MRN: [id number] DOB: 1956/03/18 Today's Date: 01/25/2020   History of Present Illness  Pt is a 64 yo female presenting with recurrent chest pain and pressure that increases with activity and is relieved with NG. Upon admission pt found to have acute respiratory failure due to acute on chronic HF. PMH includes: CAD, CHF, DM II, HTN, CKD, HLD, and chronic back pain.  Clinical Impression  Pt in bed upon arrival of PT, agreeable to evaluation at this time. Prior to admission the pt was independent with use of cane or RW at home (uses cane more often, prefers RW but it does not fit in all doorways) and some assist from husband for ADLs such as dressing and home care. The pt now presents with limitations in functional mobility, stability, and endurance due to above dx, and will continue to benefit from skilled PT to address these deficits. The pt was able to demo multiple transfers within the room from various surfaces with good use of RW and safety awareness. However, the pt became significantly fatigued following 4ft hallway ambulation and required standing rest break every 15-20 ft. Vital signs remained stable and the pt reported no onset of pain or discomfort. The pt will continue to benefit from skilled PT to further progress functional endurance and stability prior to d/c in order to facilitate return to independence with mobility at home.      Follow Up Recommendations Outpatient PT;Supervision for mobility/OOB (continue OPPT)    Equipment Recommendations   (tub bench)    Recommendations for Other Services       Precautions / Restrictions Precautions Precautions: Fall Restrictions Weight Bearing Restrictions: No      Mobility  Bed Mobility Overal bed mobility: Independent                Transfers Overall transfer level: Needs assistance Equipment used: Rolling walker (2 wheeled) Transfers: Sit to/from  Stand Sit to Stand: Supervision         General transfer comment: supervision for safey from a variety of surfaces in her room. able to stand without use of RW but improved stability with AD  Ambulation/Gait Ambulation/Gait assistance: Supervision Gait Distance (Feet): 100 Feet Assistive device: Rolling walker (2 wheeled) Gait Pattern/deviations: Step-through pattern;Decreased stride length;Wide base of support;Trunk flexed Gait velocity: 0.3 m/s Gait velocity interpretation: <1.31 ft/sec, indicative of household ambulator General Gait Details: pt with slow, small steps with minimal clearance. fatigues quickly, RW for stability but minimal reliance on RW.        Balance Overall balance assessment: Needs assistance Sitting-balance support: No upper extremity supported;Feet supported Sitting balance-Leahy Scale: Good Sitting balance - Comments: supervision   Standing balance support: Bilateral upper extremity supported;During functional activity Standing balance-Leahy Scale: Fair Standing balance comment: able to static stand without RW, but prefers RW for ambulation                             Pertinent Vitals/Pain Pain Assessment: No/denies pain    Home Living Family/patient expects to be discharged to:: Private residence Living Arrangements: Spouse/significant other   Type of Home: Apartment Home Access: Stairs to enter Entrance Stairs-Rails: None Entrance Stairs-Number of Steps: 2 from parking area to apt door. pt recieves assist from husband. Home Layout: One level Home Equipment: Cane - single point;Walker - 2 wheels;Shower seat      Prior Function Level of Independence: Needs assistance  Gait / Transfers Assistance Needed: uses SPC most often, reports she does use walker around house every once in a while, but doorways are too small for RW. pt reports prefers RW.  ADL's / Homemaking Assistance Needed: spouse assists with socks, shoes. Pt reports  fall in shower ~3 years ago,scared of shower since then and uses bird bath.  Comments: husband does the driving, pt reports wearing depends at home     Hand Dominance   Dominant Hand: Right    Extremity/Trunk Assessment   Upper Extremity Assessment Upper Extremity Assessment: Overall WFL for tasks assessed    Lower Extremity Assessment Lower Extremity Assessment: Overall WFL for tasks assessed [facility] for transfers, but limited endurance)    Cervical / Trunk Assessment Cervical / Trunk Assessment: Kyphotic  Communication   Communication: No difficulties  Cognition Arousal/Alertness: Awake/alert Behavior During Therapy: WFL for tasks assessed/performed Overall Cognitive Status: Within Functional Limits for tasks assessed                                        General Comments General comments (skin integrity, edema, etc.): VSS, pt reports sig fatigue following ~50 ft ambulation, then requiring standing rest break every 20 ft.        Assessment/Plan    PT Assessment Patient needs continued PT services  PT Problem List Decreased strength;Decreased mobility;Decreased activity tolerance;Decreased balance;Obesity;Cardiopulmonary status limiting activity       PT Treatment Interventions DME instruction;Gait training;Stair training;Therapeutic activities;Functional mobility training;Therapeutic exercise;Balance training;Patient/family education    PT Goals (Current goals can be found in the Care Plan section)  Acute Rehab PT Goals Patient Stated Goal: return to PT PT Goal Formulation: With patient Time For Goal Achievement: 02/08/20 Potential to Achieve Goals: Good    Frequency Min 3X/week    AM-PAC PT "6 Clicks" Mobility  Outcome Measure Help needed turning from your back to your side while in a flat bed without using bedrails?: A Little Help needed moving from lying on your back to sitting on the side of a flat bed without using bedrails?: A  Little Help needed moving to and from a bed to a chair (including a wheelchair)?: A Little Help needed standing up from a chair using your arms (e.g., wheelchair or bedside chair)?: A Little Help needed to walk in hospital room?: A Little Help needed climbing 3-5 steps with a railing? : A Lot 6 Click Score: 17    End of Session Equipment Utilized During Treatment: Gait belt Activity Tolerance: Patient limited by fatigue;Patient tolerated treatment well Patient left: in chair;with call bell/phone within reach Nurse Communication: Mobility status PT Visit Diagnosis: Muscle weakness (generalized) (M62.81);Difficulty in walking, not elsewhere classified (R26.2)    Time: 3291-9166 PT Time Calculation (min) (ACUTE ONLY): 46 min   Charges:   PT Evaluation $PT Eval Moderate Complexity: 1 Mod PT Treatments $Gait Training: 8-22 mins $Therapeutic Activity: 8-22 mins        Karma Ganja, PT, DPT   Acute Rehabilitation Department Pager #: [phone]  Otho Bellows 01/25/2020, 12:47 PM

## 2020-01-25 NOTE — Telephone Encounter (Signed)
CSW consulted by inpatient [name] team to help obtain BP cuff for pt to monitor BP at home.  Pt unable to afford cuff herself.  CSW able to order cuff to be delivered to pt home- anticipated delivery 6/26  No further needs at this time  Jorge Ny, [facility] Desk#: [phone] Cell#: [phone]

## 2020-01-25 NOTE — Care Management (Signed)
1207 01-25-20 Case Manager provided patient with a scale for home from the heart failure clinic. The social worker with the heart failure clinic will assist the patient with a blood pressure cuff. The cuff will have to mailed to the patient's address. No further needs from Case Manager at this time. Bethena Roys, RN,BSN Case Manager

## 2020-01-26 LAB — GLUCOSE, CAPILLARY
Glucose-Capillary: 171 mg/dL — ABNORMAL HIGH (ref 70–99)
Glucose-Capillary: 186 mg/dL — ABNORMAL HIGH (ref 70–99)

## 2020-01-26 MED ORDER — LOPERAMIDE HCL 2 MG PO CAPS
2.0000 mg | ORAL_CAPSULE | ORAL | Status: DC | PRN
  Administered 2020-01-26: 2 mg via ORAL
  Filled 2020-01-26: qty 1

## 2020-01-26 MED ORDER — LOPERAMIDE HCL 2 MG PO CAPS: 2.0000 mg | ORAL_CAPSULE | Freq: Two times a day (BID) | ORAL | 0 refills | Status: AC | PRN

## 2020-01-26 NOTE — Progress Notes (Signed)
Pt IV removed by NT, catheter intact and telemetry removed, CCMD aware Pt discharge education provided at bedside  Pt has all belongings including printed prescription and scale Pt discharged via wheelchair with NT

## 2020-01-26 NOTE — TOC Transition Note (Signed)
Transition of Care [facility]) - CM/SW Discharge Note   Patient Details  Name: [name] MRN: [id number] Date of Birth: 1955/12/30  [facility] CM/SW Contact:  Claudie Leach, RN 01/26/2020, 10:43 AM   Clinical Narrative:    Patient d/c to resume OP PT where she was going PTA.  Patient states she does not know what office or where and her family takes her.  She states she likes going there and will continue.    Insurance does not cover a tub bench and patient would be charged $60 to get one today.  She does not have means to pay for this and states she cannot afford it.  The patient is advised she may find a less expensive one at another store- she states she will decide and look into it on her own.     Final next level of care: OP Rehab Barriers to Discharge: No Barriers Identified

## 2020-01-26 NOTE — Discharge Summary (Signed)
Discharge Summary  [name]:[id number] DOB: 12-Jun-1956  PCP: Ladell Pier, MD  Admit date: 01/23/2020 Discharge date: 01/26/2020  Time spent: 35 minutes  Recommendations for Outpatient Follow-up:  1. Follow-up with cardiology 2. Follow-up with your PCP 3. Take your medications as prescribed 4. Continue outpatient PT with assistance and fall precaution  Discharge Diagnoses:  Active Hospital Problems   Diagnosis Date Noted  . Acute respiratory failure with hypoxia ([place]) 01/24/2020  . Chest pain 01/23/2020  . Legally blind in right eye, as defined in [place] 08/11/2018  . Controlled type 2 diabetes mellitus with complication, with long-term current use of insulin ([place]) 09/12/2015  . Chronic renal insufficiency, stage III (moderate) 03/19/2015  . Prolonged Q-T interval on ECG 08/10/2013  . Dyslipidemia 04/02/2013  . Acute on chronic diastolic heart failure ([place]) 04/07/2012  . Severe obesity (BMI >= 40) ([place]) 01/17/2012  . Essential hypertension 04/17/2007    Resolved Hospital Problems  No resolved problems to display.    Discharge Condition: Stable  Diet recommendation: Heart healthy, low-salt, carb modified diet.  Vitals:   01/26/20 0635 01/26/20 0917  BP: (!) 151/69 (!) 141/60  Pulse: 75 68  Resp: 16   Temp: 98.6 F (37 C)   SpO2: 100%     History of present illness:  Elizabeth M Gatlingis a very pleasant64 y.o.femalewith a past medical history that includes CAD, diastolic heart failure, exertional angina pectoris, diabetes, chronic kidney disease, morbid obesity admitted June 23 with acute respiratory failure with hypoxia secondary to acute on chronic diastolic heart failure and chest pain and hypertension. Was diuresed and also evaluated by cardiology.  Will resume her home cardiac medications at discharge and closely follow-up with her cardiologist outpatient.  01/26/20: Seen and examined at her bedside this morning.  No chest pain or dyspnea.  Eager  to go home.   Hospital Course:  Principal Problem:   Acute respiratory failure with hypoxia (HCC) Active Problems:   Essential hypertension   Severe obesity (BMI >= 40) (HCC)   Acute on chronic diastolic heart failure (H[facility])   Dyslipidemia   Prolonged Q-T interval on ECG   Chronic renal insufficiency, stage III (moderate)   Controlled type 2 diabetes mellitus with complication, with long-term current use of insulin ([place])   Legally blind in right eye, as defined in [place]   Chest pain  Resolved acute hypoxic respiratory failure with hypoxia secondary to acute on chronic diastolic heart failure in the setting of uncontrolled blood pressure. Presented with O2 saturation of 88% on room air.   Chest x-ray no evidence of acute cardiopulmonary abnormality, BNP 27 Tachycardic with uncontrolled hypertension on presentation which may have contributed to her acute hypoxia O2 saturation improved with diuresis IV Lasix 80 mg twice daily.   Currently with O2 saturation 100% on room air. Dyspnea and chest pain have resolved Resume home Lasix dose 120 mg twice daily  Improving acute on chronic diastolic congestive heart failure.  2D echo done on 01/24/2020 showed preserved LVEF 65 to 70% with grade 2 diastolic dysfunction Seen by cardiology. Continue salt restriction, home Lasix dose, daily weight. Follow-up with your cardiologist outpatient  Resolved chest pain. Denies any anginal symptoms at the time of this visit Patient with a history of angina pectoris, CAD.  Continue Imdur EKG with sinus tachycardia and right bundle branch block, hs trop 6>>26>13>17. Evaluated by cards who opinelikely ischemia exacerbation related to uncontrolled blood pressure. Of note cards indicates angina pectoris well controlled in the past  and she has extensive CAD not amenable to surgical or percutaneous revascularization. Continue home cardiac medications.  Essential hypertension.  Blood pressure is at  goal  Continue home medications  Follow-up with your PCP  Diabetes type II uncontrolled with hyperglycemia. Hemoglobin A1c 10.1 on 01/25/2020 Resume home regimen Follow-up with your PCP  CKD 3A.  She appears to be at her baseline creatinine 1.2 with GFR 52  Continue to avoid nephrotoxins  Follow-up with your PCP  Severe obesity.  BMI 49 Will need weight loss outpatient with regular physical activity and healthy dieting. Follow-up with your PCP  Chronic intermittent loose stools Continue home regimen Added Imodium as needed Follow-up with your PCP   Family Communication/Anticipated D/C date and plan/Code Status    Code Status:Full Code.     Consultations:  Cardiology  Discharge Exam: BP (!) 141/60 (BP Location: Left Arm)   Pulse 68   Temp 98.6 F (37 C) (Oral)   Resp 16   Ht _0  (1.6 m)   Wt 127 kg   LMP 04/02/2012   SpO2 100%   BMI 49.60 kg/m  . General: 64 y.o. year-old female well developed well nourished in no acute distress.  Alert and oriented x3. . Cardiovascular: Regular rate and rhythm with no rubs or gallops.  No thyromegaly or JVD noted.   Marland Kitchen Respiratory: Clear to auscultation with no wheezes or rales. Good inspiratory effort. . Abdomen: Soft nontender nondistended with normal bowel sounds x4 quadrants. . Musculoskeletal: No lower extremity edema. 2/4 pulses in all 4 extremities. Marland Kitchen Psychiatry: Mood is appropriate for condition and setting  Discharge Instructions You were cared for by a hospitalist during your hospital stay. If you have any questions about your discharge medications or the care you received while you were in the hospital after you are discharged, you can call the unit and asked to speak with the hospitalist on call if the hospitalist that took care of you is not available. Once you are discharged, your primary care physician will handle any further medical issues. Please note that NO REFILLS for any discharge medications will be  authorized once you are discharged, as it is imperative that you return to your primary care physician (or establish a relationship with a primary care physician if you do not have one) for your aftercare needs so that they can reassess your need for medications and monitor your lab values.   Allergies as of 01/26/2020      Reactions   Ativan [lorazepam] Anaphylaxis   Orange Fruit [citrus] Anaphylaxis, Other (See Comments)   Pt stated throat swelling, itching      Medication List    STOP taking these medications   metoCLOPramide 5 MG tablet Commonly known as: REGLAN   ReliOn Insulin Syringe 31G X 15/64" 1 ML Misc Generic drug: Insulin Syringe-Needle U-100     TAKE these medications   Accu-Chek Softclix Lancets lancets Use as instructed for 3 times daily blood glucose monitoring   acetaminophen 500 MG tablet Commonly known as: TYLENOL Take 1 tablet (500 mg total) by mouth every 6 (six) hours as needed. What changed: reasons to take this   acetaZOLAMIDE 500 MG capsule Commonly known as: DIAMOX Take 500 mg by mouth 2 (two) times daily.   aspirin EC 81 MG tablet Take 81 mg by mouth daily.   atorvastatin 80 MG tablet Commonly known as: LIPITOR TAKE 1 TABLET BY MOUTH ONCE DAILY IN THE EVENING AT 6  PM What changed:  how much to take  how to take this  when to take this  additional instructions   B-12 1000 MCG Tabs Take 1,000 mcg by mouth daily.   bismuth subsalicylate 195 KD/32IZ suspension Commonly known as: PEPTO BISMOL Take 30 mLs by mouth every 6 (six) hours as needed for indigestion.   Blood Pressure Monitor/Arm Devi 1 each by Does not apply route daily. ICD 10, I10 and I50.32   calcium carbonate 500 MG chewable tablet Commonly known as: TUMS - dosed in mg elemental calcium Chew 1 tablet by mouth 3 (three) times daily as needed for indigestion.   Combigan 0.2-0.5 % ophthalmic solution Generic drug: brimonidine-timolol Place 1 drop into both eyes 2 (two)  times daily.   diltiazem 240 MG 24 hr capsule Commonly known as: DILACOR XR Take 1 capsule (240 mg total) by mouth daily.   ferrous sulfate 325 (65 FE) MG tablet Take 325 mg by mouth daily with breakfast.   furosemide 40 MG tablet Commonly known as: LASIX TAKE 1 TABLET BY MOUTH  TWICE DAILY (TAKE WITH  FUROSEMIDE 80 MG TAB) What changed: See the new instructions.   furosemide 80 MG tablet Commonly known as: LASIX TAKE 1 TABLET BY MOUTH TWO  TIMES DAILY WITH FUROSEMIDE 40MG  (TOTAL DOSE OF 120MG  TWO TIMES DAILY ) What changed: See the new instructions.   glucose blood test strip Use TID before meals / Dx E11.9   glucose monitoring kit monitoring kit 1 each by Does not apply route 4 (four) times daily - after meals and at bedtime.   Accu-Chek Aviva Plus w/Device Kit Used as directed   HumaLOG Mix 75/25 (75-25) 100 UNIT/ML Susp injection Generic drug: insulin lispro protamine-lispro INJECT SUBCUTANEOUSLY 54  UNITS TWICE DAILY WITH  MEALS. Please make PCP appointment. What changed:   how much to take  how to take this  when to take this  additional instructions   isosorbide mononitrate 120 MG 24 hr tablet Commonly known as: IMDUR TAKE 1 TABLET BY MOUTH ONCE DAILY   loperamide 2 MG capsule Commonly known as: IMODIUM Take 1 capsule (2 mg total) by mouth 2 (two) times daily as needed for up to 5 days for diarrhea or loose stools.   losartan 50 MG tablet Commonly known as: COZAAR Take 2 tablets by mouth once daily   Lumigan 0.01 % Soln Generic drug: bimatoprost Place 1 drop into the right eye at bedtime.   megestrol 40 MG tablet Commonly known as: MEGACE Take 1 tablet (40 mg total) by mouth 2 (two) times daily.   metoprolol 200 MG 24 hr tablet Commonly known as: TOPROL-XL Take 1 tablet by mouth once daily   MUSCLE RUB EX Apply 1 application topically daily as needed (knee pain).   nitroGLYCERIN 0.4 MG SL tablet Commonly known as: NITROSTAT Place 1 tablet  (0.4 mg total) under the tongue every 5 (five) minutes as needed for chest pain. Max 3 doses in 15 minutes. <PLEASE MAKE APPOINTMENT FOR REFILLS>   pantoprazole 40 MG tablet Commonly known as: PROTONIX TAKE ONE TABLET BY MOUTH ONCE DAILY   potassium chloride SA 20 MEQ tablet Commonly known as: KLOR-CON TAKE 2 TABLETS BY MOUTH IN  THE MORNING AND 1 TABLET IN THE EVENING What changed:   how much to take  when to take this  additional instructions            Durable Medical Equipment  (From admission, onward)  Start     Ordered   01/25/20 1419  For home use only DME Tub bench  Once        01/25/20 1418         Allergies  Allergen Reactions  . Ativan [Lorazepam] Anaphylaxis  . Orange Fruit [Citrus] Anaphylaxis and Other (See Comments)    Pt stated throat swelling, itching    Follow-up Information    Ladell Pier, MD. Call in 1 day(s).   Specialty: Internal Medicine Why: Please call for a post hospital follow-up appointment. Contact information: [place] [address] [phone]        Sanda Klein, MD .   Specialty: Cardiology Contact information: [address] [place] [address] [phone]                The results of significant diagnostics from this hospitalization (including imaging, microbiology, ancillary and laboratory) are listed below for reference.    Significant Diagnostic Studies: DG Chest 2 View  Result Date: 01/23/2020 CLINICAL DATA:  Chest pressure that began yesterday. Additional history provided: Chest pressure and shortness of breath for 2 days. History of CKD, CAD, CHF, GERD, hypertension. EXAM: CHEST - 2 VIEW COMPARISON:  Prior chest CT 09/20/2016, chest radiograph 09/09/2016 FINDINGS: Heart size within normal limits. No appreciable airspace consolidation within the lungs. No frank pulmonary edema. No evidence of pleural effusion or pneumothorax. No acute bony abnormality  identified. IMPRESSION: No evidence of acute cardiopulmonary abnormality. Please refer to prior chest CT 09/20/2016 for a description of previously demonstrated small pulmonary nodules and follow-up recommendations. Electronically Signed   By: Kellie Simmering DO   On: 01/23/2020 13:37   ECHOCARDIOGRAM COMPLETE  Result Date: 01/24/2020    ECHOCARDIOGRAM REPORT   Patient Name:   [name] Date of Exam: 01/24/2020 Medical Rec #:  [id number]        Height:       63.0 in Accession #:    [id number]       Weight:       279.5 lb Date of Birth:  November 20, 1955        BSA:          2.229 m Patient Age:    52 years         BP:           127/42 mmHg Patient Gender: F                HR:           62 bpm. Exam Location:  Inpatient Procedure: 2D Echo, Color Doppler and Cardiac Doppler Indications:    I50.9* Heart failure (unspecified)  History:        Patient has prior history of Echocardiogram examinations, most                 recent 02/10/2016. CHF, NSTEMI; Risk Factors:Hypertension,                 Diabetes, Dyslipidemia and Sleep Apnea.  Sonographer:    Raquel Sarna Senior RDCS Referring Phys: [id number] [place] FAIR  Sonographer Comments: Technically difficult study due to patient body habitus. IMPRESSIONS  1. Left ventricular ejection fraction, by estimation, is 65 to 70%. The left ventricle has normal function. The left ventricle has no regional wall motion abnormalities. There is mild left ventricular hypertrophy. Left ventricular diastolic parameters are consistent with Grade II diastolic dysfunction (pseudonormalization). Elevated left atrial pressure.  2. Right ventricular systolic  function is normal. The right ventricular size is normal. There is normal pulmonary artery systolic pressure. The estimated right ventricular systolic pressure is 56.8 mmHg.  3. The mitral valve is normal in structure. No evidence of mitral valve regurgitation.  4. The aortic valve was not well visualized. Aortic valve regurgitation is not  visualized. No aortic stenosis is present.  5. The inferior vena cava is normal in size with <50% respiratory variability, suggesting right atrial pressure of 8 mmHg. FINDINGS  Left Ventricle: Left ventricular ejection fraction, by estimation, is 65 to 70%. The left ventricle has normal function. The left ventricle has no regional wall motion abnormalities. The left ventricular internal cavity size was normal in size. There is  mild left ventricular hypertrophy. Left ventricular diastolic parameters are consistent with Grade II diastolic dysfunction (pseudonormalization). Elevated left atrial pressure. Right Ventricle: The right ventricular size is normal. Right vetricular wall thickness was not assessed. Right ventricular systolic function is normal. There is normal pulmonary artery systolic pressure. The tricuspid regurgitant velocity is 2.61 m/s, and with an assumed right atrial pressure of 8 mmHg, the estimated right ventricular systolic pressure is 12.7 mmHg. Left Atrium: Left atrial size was normal in size. Right Atrium: Right atrial size was not well visualized. Pericardium: There is no evidence of pericardial effusion. Mitral Valve: The mitral valve is normal in structure. No evidence of mitral valve regurgitation. Tricuspid Valve: The tricuspid valve is normal in structure. Tricuspid valve regurgitation is trivial. Aortic Valve: The aortic valve was not well visualized. Aortic valve regurgitation is not visualized. No aortic stenosis is present. Pulmonic Valve: The pulmonic valve was not well visualized. Pulmonic valve regurgitation is not visualized. Aorta: The aortic root is normal in size and structure. Venous: The inferior vena cava is normal in size with less than 50% respiratory variability, suggesting right atrial pressure of 8 mmHg. IAS/Shunts: The interatrial septum was not well visualized.  LEFT VENTRICLE PLAX 2D LVIDd:         3.80 cm  Diastology LVIDs:         2.00 cm  LV e' lateral:   5.55 cm/s  LV PW:         1.20 cm  LV E/e' lateral: 18.7 LV IVS:        1.10 cm  LV e' medial:    5.44 cm/s LVOT diam:     2.10 cm  LV E/e' medial:  19.1 LV SV:         77 LV SV Index:   34 LVOT Area:     3.46 cm  RIGHT VENTRICLE RV S prime:     9.36 cm/s TAPSE (M-mode): 2.0 cm LEFT ATRIUM             Index LA diam:        3.20 cm 1.44 cm/m LA Vol (A2C):   49.1 ml 22.03 ml/m LA Vol (A4C):   69.2 ml 31.04 ml/m LA Biplane Vol: 59.6 ml 26.74 ml/m  AORTIC VALVE LVOT Vmax:   88.10 cm/s LVOT Vmean:  59.200 cm/s LVOT VTI:    0.222 m  AORTA Ao Root diam: 3.00 cm Ao Asc diam:  3.50 cm MITRAL VALVE                TRICUSPID VALVE MV Area (PHT): 3.31 cm     TR Peak grad:   27.2 mmHg MV Decel Time: 229 msec     TR Vmax:        261.00 cm/s  MV E velocity: 104.00 cm/s MV A velocity: 102.00 cm/s  SHUNTS MV E/A ratio:  1.02         Systemic VTI:  0.22 m                             Systemic Diam: 2.10 cm Oswaldo Milian MD Electronically signed by Oswaldo Milian MD Signature Date/Time: 01/24/2020/8:53:23 PM    Final     Microbiology: Recent Results (from the past 240 hour(s))  SARS Coronavirus 2 by RT PCR (hospital order, performed in [facility] hospital lab) Nasopharyngeal Nasopharyngeal Swab     Status: None   Collection Time: 01/23/20 10:00 PM   Specimen: Nasopharyngeal Swab  Result Value Ref Range Status   SARS Coronavirus 2 NEGATIVE NEGATIVE Final    Comment: (NOTE) SARS-CoV-2 target nucleic acids are NOT DETECTED.  The SARS-CoV-2 RNA is generally detectable in upper and lower respiratory specimens during the acute phase of infection. The lowest concentration of SARS-CoV-2 viral copies this assay can detect is 250 copies / mL. A negative result does not preclude SARS-CoV-2 infection and should not be used as the sole basis for treatment or other patient management decisions.  A negative result may occur with improper specimen collection / handling, submission of specimen other than nasopharyngeal swab,  presence of viral mutation(s) within the areas targeted by this assay, and inadequate number of viral copies (<250 copies / mL). A negative result must be combined with clinical observations, patient history, and epidemiological information.  Fact Sheet for Patients:   StrictlyIdeas.no  Fact Sheet for Healthcare Providers: BankingDealers.co.za  This test is not yet approved or  cleared by the [place] FDA and has been authorized for detection and/or diagnosis of SARS-CoV-2 by FDA under an Emergency Use Authorization (EUA).  This EUA will remain in effect (meaning this test can be used) for the duration of the COVID-19 declaration under Section 564(b)(1) of the Act, 21 U.S.C. section 360bbb-3(b)(1), unless the authorization is terminated or revoked sooner.  Performed at [facility], [place] [address] [place], [place] [address]      Labs: Basic Metabolic Panel: Recent Labs  Lab 01/23/20 1239 01/23/20 2329 01/24/20 0448 01/25/20 0542  NA 139  --  138 139  K 4.2  --  4.7 3.5  CL 108  --  108 110  CO2 21*  --  22 19*  GLUCOSE 266*  --  267* 173*  BUN 10  --  12 14  CREATININE 1.16*  --  1.32* 1.25*  CALCIUM 9.0  --  8.8* 8.7*  MG  --  2.0 2.2 2.1   Liver Function Tests: No results for input(s): AST, ALT, ALKPHOS, BILITOT, PROT, ALBUMIN in the last 168 hours. No results for input(s): LIPASE, AMYLASE in the last 168 hours. No results for input(s): AMMONIA in the last 168 hours. CBC: Recent Labs  Lab 01/23/20 1239 01/24/20 0448  WBC 11.2* 10.5  HGB 12.5 11.6*  HCT 38.8 35.8*  MCV 96.3 95.7  PLT 241 226   Cardiac Enzymes: No results for input(s): CKTOTAL, CKMB, CKMBINDEX, TROPONINI in the last 168 hours. BNP: BNP (last 3 results) Recent Labs    01/23/20 2329  BNP 27.2    ProBNP (last 3 results) No results for input(s): PROBNP in the last 8760 hours.  CBG: Recent Labs  Lab 01/25/20 0630  01/25/20 1106 01/25/20 1607 01/25/20 2102 01/26/20 0634  GLUCAP 169* 184*  247* 170* 171*       Signed:  Kayleen Memos, MD [facility] 01/26/2020, 10:12 AM

## 2020-01-26 NOTE — Discharge Instructions (Signed)
Heart Failure, Self Care Heart failure is a serious condition. This sheet explains things you need to do to take care of yourself at home. To help you stay as healthy as possible, you may be asked to change your diet, take certain medicines, and make other changes in your life. Your doctor may also give you more specific instructions. If you have problems or questions, call your doctor. What are the risks? Having heart failure makes it more likely for you to have some problems. These problems can get worse if you do not take good care of yourself. Problems may include:  Blood clotting problems. This may cause a stroke.  Damage to the kidneys, liver, or lungs.  Abnormal heart rhythms. Supplies needed:  Scale for weighing yourself.  Blood pressure monitor.  Notebook.  Medicines. How to care for yourself when you have heart failure Medicines Take over-the-counter and prescription medicines only as told by your doctor. Take your medicines every day.  Do not stop taking your medicine unless your doctor tells you to do so.  Do not skip any medicines.  Get your prescriptions refilled before you run out of medicine. This is important. Eating and drinking   Eat heart-healthy foods. Talk with a diet specialist (dietitian) to create an eating plan.  Choose foods that: ? Have no trans fat. ? Are low in saturated fat and cholesterol.  Choose healthy foods, such as: ? Fresh or frozen fruits and vegetables. ? Fish. ? Low-fat (lean) meats. ? Legumes, such as beans, peas, and lentils. ? Fat-free or low-fat dairy products. ? Whole-grain foods. ? High-fiber foods.  Limit salt (sodium) if told by your doctor. Ask your diet specialist to tell you which seasonings are healthy for your heart.  Cook in healthy ways instead of frying. Healthy ways of cooking include roasting, grilling, broiling, baking, poaching, steaming, and stir-frying.  Limit how much fluid you drink, if told by your  doctor. Alcohol use  Do not drink alcohol if: ? Your doctor tells you not to drink. ? Your heart was damaged by alcohol, or you have very bad heart failure. ? You are pregnant, may be pregnant, or are planning to become pregnant.  If you drink alcohol: ? Limit how much you use to:  0-1 drink a day for women.  0-2 drinks a day for men. ? Be aware of how much alcohol is in your drink. In the [place] one drink equals one 12 oz bottle of beer (355 mL), one 5 oz glass of wine (148 mL), or one 1 oz glass of hard liquor (44 mL). Lifestyle   Do not use any products that contain nicotine or tobacco, such as cigarettes, e-cigarettes, and chewing tobacco. If you need help quitting, ask your doctor. ? Do not use nicotine gum or patches before talking to your doctor.  Do not use illegal drugs.  Lose weight if told by your doctor.  Do physical activity if told by your doctor. Talk to your doctor before you begin an exercise if: ? You are an older adult. ? You have very bad heart failure.  Learn to manage stress. If you need help, ask your doctor.  Get rehab (rehabilitation) to help you stay independent and to help with your quality of life.  Plan time to rest when you get tired. Check weight and blood pressure   Weigh yourself every day. This will help you to know if fluid is building up in your body. ? Weigh yourself every morning   after you pee (urinate) and before you eat breakfast. ? Wear the same amount of clothing each time. ? Write down your daily weight. Give your record to your doctor.  Check and write down your blood pressure as told by your doctor.  Check your pulse as told by your doctor. Dealing with very hot and very cold weather  If it is very hot: ? Avoid activities that take a lot of energy. ? Use air conditioning or fans, or find a cooler place. ? Avoid caffeine and alcohol. ? Wear clothing that is loose-fitting, lightweight, and light-colored.  If it is very  cold: ? Avoid activities that take a lot of energy. ? Layer your clothes. ? Wear mittens or gloves, a hat, and a scarf when you go outside. ? Avoid alcohol. Follow these instructions at home:  Stay up to date with shots (vaccines). Get pneumococcal and flu (influenza) shots.  Keep all follow-up visits as told by your doctor. This is important. Contact a doctor if:  You gain weight quickly.  You have increasing shortness of breath.  You cannot do your normal activities.  You get tired easily.  You cough a lot.  You don't feel like eating or feel like you may vomit (nauseous).  You become puffy (swell) in your hands, feet, ankles, or belly (abdomen).  You cannot sleep well because it is hard to breathe.  You feel like your heart is beating fast (palpitations).  You get dizzy when you stand up. Get help right away if:  You have trouble breathing.  You or someone else notices a change in your behavior, such as having trouble staying awake.  You have chest pain or discomfort.  You pass out (faint). These symptoms may be an emergency. Do not wait to see if the symptoms will go away. Get medical help right away. Call your local emergency services (911 in the [place] Do not drive yourself to the hospital. Summary  Heart failure is a serious condition. To care for yourself, you may have to change your diet, take medicines, and make other lifestyle changes.  Take your medicines every day. Do not stop taking them unless your doctor tells you to do so.  Eat heart-healthy foods, such as fresh or frozen fruits and vegetables, fish, lean meats, legumes, fat-free or low-fat dairy products, and whole-grain or high-fiber foods.  Ask your doctor if you can drink alcohol. You may have to stop alcohol use if you have very bad heart failure.  Contact your doctor if you gain weight quickly or feel that your heart is beating too fast. Get help right away if you pass out, or have chest pain  or trouble breathing. This information is not intended to replace advice given to you by your health care provider. Make sure you discuss any questions you have with your health care provider. Document Revised: 10/31/2018 Document Reviewed: 11/01/2018 Elsevier Patient Education  2020 [employer]  

## 2020-01-28 ENCOUNTER — Telehealth: Payer: Self-pay

## 2020-01-28 NOTE — Telephone Encounter (Signed)
Transition Care Management Follow-up Telephone Call  Date of discharge and from where: 01/26/2020, [facility]   How have you been since you were released from the hospital? She said that she is okay, just tired,   Any questions or concerns?  none at this time.   Items Reviewed:  Did the pt receive and understand the discharge instructions provided?  she said she is not sure where she put them   Medications obtained and verified?  she said that she has all of her medications and did not have any questions.  She is aware of the new medication and is also aware to stop the metoclopramide  Any new allergies since your discharge?  none reported   Do you have support at home? yes, her husband  Other (ie: DME, [place], etc) no home health ordered. She plans to attend outpatient PT.  Has scale and said that she has been weighing herself at the same time each day and keeping a log of the results. Reviewed with her when to call MD with weight gain   No BP monitor  Needs a glucometer.  She said she that hers is lost  Has walker and cane.  Functional Questionnaire: (I = Independent and D = Dependent) ADL's: husband assists as needed   Follow up appointments reviewed:    PCP Hospital f/u appt confirmed?Dr Wynetta Emery  -02/08/2020 @ [facility] f/u appt confirmed?cardiology - 02/06/2020  Are transportation arrangements needed? no, her husband drives  If their condition worsens, is the pt aware to call  their PCP or go to the ED?  yes  Was the patient provided with contact information for the PCP's office or ED?she has the phone number for  the clinic  Was the pt encouraged to call back with questions or concerns?  yes

## 2020-01-29 ENCOUNTER — Ambulatory Visit: Payer: Medicare Other | Admitting: Physical Therapy

## 2020-01-29 ENCOUNTER — Other Ambulatory Visit: Payer: Self-pay

## 2020-01-29 ENCOUNTER — Encounter: Payer: Self-pay | Admitting: Physical Therapy

## 2020-01-29 DIAGNOSIS — M6281 Muscle weakness (generalized): Secondary | ICD-10-CM

## 2020-01-29 DIAGNOSIS — R2689 Other abnormalities of gait and mobility: Secondary | ICD-10-CM

## 2020-01-29 NOTE — Therapy (Signed)
[place], [place], [address] Phone: [phone]   Fax:  [phone]  Physical Therapy Treatment/No visit   Patient Details  Name: [name] MRN: [id number] Date of Birth: 1955/09/06 Referring Provider (PT): Dr Karle Plumber    Encounter Date: 01/29/2020   PT End of Session - 01/29/20 0835    Visit Number 3   no visit   Number of Visits 12    Date for PT Re-Evaluation 02/19/20    PT Start Time 0808   Patient 8 minutes late   PT Stop Time 0830   Patient sent to the ED   PT Time Calculation (min) 22 min    Activity Tolerance Patient limited by fatigue    Behavior During Therapy [facility] for tasks assessed/performed           Past Medical History:  Diagnosis Date   Blind right eye    Chronic back pain    "my whole back" (03/18/2015)   Chronic chest pain    takes isosorbide   Chronic diastolic heart failure ([place]) 9/13   echo 03/20/15 LV Ef of 60-65%, grade 2DD and PA peak pressure: 36 mm Hg    CKD (chronic kidney disease), stage III    Coronary artery disease    Dr Sallyanne Kuster, cardiac cath 02-07-2009 -- diffuse 3V CAD involving RCA, CFx, and D1,  pt not a candidate for bypass surgery or angioplasty,  medical management   Diabetic neuropathy ([place])    Diabetic retinopathy ([place])    bilateral proliferative   Diastolic CHF, chronic ([place])    Dyspnea    "always"   Edema of both lower extremities    Generalized weakness    GERD (gastroesophageal reflux disease)    Glaucoma, both eyes    History of non-ST elevation myocardial infarction (NSTEMI)    02-25-2009;  05-15-2009;  01-08-2010;  04-04-2012 this MI was post-op surgery for abscess on 04-03-2012   History of sepsis    Hyperlipemia    Hypertension    Insulin dependent type 2 diabetes mellitus ([place])    followed by pcp   Iron deficiency anemia due to chronic blood loss    uterine bleeding   Legally blind in left eye, as  defined in [place]    per pt states vision "is foggy"   Mild obstructive sleep apnea    study in epic 04-19-2012 mild osa,  recommendation's were wt. loss, dental  appliance, surgery, or cpap   Morbid obesity with BMI of 40.0-44.9, adult ([place])    OA (osteoarthritis)    knees   PSVT (paroxysmal supraventricular tachycardia) ([place])    Renal insufficiency    pt. denies   Seasonal allergies    Wears glasses     Past Surgical History:  Procedure Laterality Date   BIOPSY  02/13/2018   Procedure: BIOPSY;  Surgeon: Ladene Artist, MD;  Location: [facility];  Service: Endoscopy;;   BREAST SURGERY Right 2019    breast biopsy,  per pt benign   CARDIAC CATHETERIZATION  02-27-2009   dr al little   diffuse 3V CAD , involving RCA, CFx, and D1;  inferior wall abnormalities, low normal ef 50%   CATARACT EXTRACTION Right    CATARACT EXTRACTION W/ INTRAOCULAR LENS IMPLANT Left 10-11-2007   @[facility]    COLONOSCOPY WITH PROPOFOL N/A 02/13/2018   Procedure: COLONOSCOPY WITH PROPOFOL;  Surgeon: Ladene Artist, MD;  Location: [facility];  Service: Endoscopy;  Laterality: N/A;  DILATION AND CURETTAGE OF UTERUS  x2 last one 2000   EYE SURGERY Bilateral 2009 to 2010   x2  left eye;  x2  right eye    HYSTEROSCOPY WITH D & C N/A 10/11/2018   Procedure: DILATATION AND CURETTAGE /HYSTEROSCOPY;  Surgeon: [name], MD;  Location: [facility];  Service: Gynecology;  Laterality: N/A;   INCISION AND DRAINAGE PERIRECTAL ABSCESS  04/03/2012   INTRAUTERINE DEVICE (IUD) INSERTION  10/11/2018   Procedure: INTRAUTERINE DEVICE (IUD) INSERTION;  Surgeon: [name], MD;  Location: [facility];  Service: Gynecology;;   IR US GUIDE VASC ACCESS LEFT  02/13/2018   IR VENIPUNCTURE 46YRS/OLDER BY MD  02/13/2018   POLYPECTOMY  02/13/2018   Procedure: POLYPECTOMY;  Surgeon: Ladene Artist, MD;  Location: [facility];  Service: Endoscopy;;    There were no vitals filed for this visit.   Subjective Assessment -  01/29/20 0829    Subjective Patient reports she has been very tired over the past few days. She was discharged on 01/26/2020 from the hospital. She reports she has not taken her B/P medication yet because it makes her go to the bathroom. See clinical impression statement below.    Pertinent History carpel tunnel, Knee OA,    Currently in Pain? No/denies                                       PT Short Term Goals - 01/23/20 1703      PT SHORT TERM GOAL #1   Title Patient will increase gross bilateral LE strength to 4+/5    Time 3    Period Weeks    Status On-going    Target Date 01/28/20      PT SHORT TERM GOAL #2   Title Patient will transfer sit to stand 3x without increased low back pain.    Time 3    Period Weeks    Status On-going    Target Date 01/28/20      PT SHORT TERM GOAL #3   Title Patient will ambualte 85' with single point cane without increased low back pain    Time 3    Period Weeks    Status On-going    Target Date 01/28/20             PT Long Term Goals - 01/07/20 1600      PT LONG TERM GOAL #1   Title Patient will ambualte 200' without increased pain in order to walk in her house    Time 6    Period Months    Status New    Target Date 02/18/20      PT LONG TERM GOAL #2   Title Patient will report no falls for 3 weeks    Time 6    Period Weeks    Status New    Target Date 02/18/20      PT LONG TERM GOAL #3   Title Patient will bend to pick object off the floor without falling    Time 6    Period Weeks    Status New    Target Date 02/18/20                 Plan - 01/29/20 0831    Clinical Impression Statement Patient's B/P was measured at 223/105 on the manual cuff today. Her B/P was re-taken on the  manual cuff and found to be 190/110. She was strongly advised to go back to the ED. She was advised she can call her MD first but they will likely advise her to go to the ED as well. She was educated that her B/P  is at a dangerous level at this time. She was advised if she is having a hard time leaving the house because of B/P isssue then maybe home health would be her best option, that way she can take her medication without worry. Thrayp will follow once she has been evalauted at the ED.    Personal Factors and Comorbidities Comorbidity 1;Comorbidity 2;Comorbidity 3+    Comorbidities bilateral Knee OA, Low back pain, chronic chest pain with activity (uses nitro); CHF, DMII,    Examination-Activity Limitations Caring for Others;Reach Overhead;Stand;Stairs;Squat;Sit;Lift    Examination-Participation Restrictions Church;Shop;Meal Prep;Community Activity    Stability/Clinical Decision Making Unstable/Unpredictable    Clinical Decision Making High    Rehab Potential Fair    PT Frequency 2x / week    PT Duration 6 weeks    PT Treatment/Interventions ADLs/Self Care Home Management;Cryotherapy;Aquatic Therapy;Electrical Stimulation;Iontophoresis 4mg /ml Dexamethasone;Traction;Ultrasound;Gait training;Stair training;Therapeutic activities;Functional mobility training;Therapeutic exercise;Balance training;Neuromuscular re-education;Patient/family education;Manual techniques;Passive range of motion;Taping    PT Next Visit Plan NuStep, progress seated strengthening and progress to standing as able, sit<>stand, standing balance at counter, incorporate UE exercises    PT Home Exercise Plan [id number]: LAQ, seated marching, seated clamshell with yellow, seated heel-toe raises, seated pillow squeeze, sit<>stand with UE support    Consulted and Agree with Plan of Care Patient           Patient will benefit from skilled therapeutic intervention in order to improve the following deficits and impairments:  Abnormal gait, Decreased strength, Decreased balance, Decreased activity tolerance, Difficulty walking  Visit Diagnosis: Other abnormalities of gait and mobility  Muscle weakness (generalized)     Problem  List Patient Active Problem List   Diagnosis Date Noted   Acute respiratory failure with hypoxia ([place]) 01/24/2020   Chest pain 01/23/2020   Acute exacerbation of CHF (congestive heart failure) ([place]) 01/23/2020   Functional urinary incontinence 09/07/2019   Urge incontinence of urine 09/07/2019   Legally blind in right eye, as defined in [place] 08/11/2018   Bilateral carpal tunnel syndrome 04/13/2018   Adenomatous polyp of colon 04/13/2018   Fibrocystic breast changes, right 12/12/2017   Drug-induced constipation 09/15/2017   Primary osteoarthritis of both knees 04/05/2017   Chronic bilateral low back pain 03/07/2017   Chronic pain of right knee 03/07/2017   Vitamin B12 deficiency 12/09/2016   Incidental lung nodule, > 10mm and < 30mm 09/14/2016   Vitreous hemorrhage of right eye ([place])    Diabetic gastroparesis associated with type 2 diabetes mellitus ([place]) 09/12/2015   Controlled type 2 diabetes mellitus with complication, with long-term current use of insulin ([place]) 09/12/2015   Chronic renal insufficiency, stage III (moderate) 03/19/2015   Prolonged Q-T interval on ECG 08/10/2013   Dyslipidemia 04/02/2013   Acute on chronic diastolic heart failure ([place]) 04/07/2012   Presumed OSA (obstructive sleep apnea) 04/06/2012   NSTEMI  post-op 04/04/12-medical Rx 04/04/2012   Severe obesity (BMI >= 40) ([place]) 01/17/2012   GERD 08/21/2009   Complex endometrial hyperplasia with atypia 03/20/2009   PERIPHERAL EDEMA 03/20/2009   Coronary atherosclerosis-not a surgical or PCI candidate 2010 02/27/2009   Proliferative diabetic retinopathy ([place]) 10/07/2007   DETACHED RETINA, BILATERAL, HX OF 09/07/2007   History of chronic Iron defeicency anemia with acute  post op anemia, transfused after debridment 04/04/12 04/17/2007   Essential hypertension 04/17/2007    Carney Living  PT DPT  01/29/2020, 8:37 AM  [facility] [address] [place], [place], [address] Phone: [phone]   Fax:  [phone]  Name: [name] MRN: [id number] Date of Birth: Oct 07, 1955

## 2020-02-01 ENCOUNTER — Telehealth: Payer: Self-pay | Admitting: Licensed Clinical Social Worker

## 2020-02-01 NOTE — Telephone Encounter (Signed)
Call placed to patient regarding [facility] referral. There was no answer and pt's voicemail is not set-up; therefore, LCSW was unable to leave a message.

## 2020-02-05 ENCOUNTER — Ambulatory Visit: Payer: Medicare Other | Attending: Internal Medicine | Admitting: Physical Therapy

## 2020-02-05 NOTE — Progress Notes (Deleted)
Cardiology Clinic Note   Patient Name: [name] Date of Encounter: 02/05/2020  Primary Care Provider:  Ladell Pier, MD Primary Cardiologist:  Sanda Klein, MD  Patient Profile    [name] 64 year old female presents to the clinic today for follow-up evaluation of her essential hypertension, coronary atherosclerosis (not a surgical candidate for PCI candidate 2010), NSTEMI postop 04/04/2012 (medical Rx), CHF, GERD, type 2 diabetes, chronic renal insufficiency stage III, chronic lower back pain, and prolonged QT.  She was admitted to the hospital on June 23 with acute respiratory failure with hypoxia secondary to acute on chronic diastolic heart failure.  She was also noted to have chest pain and hypertension.  She was diuresed and underwent evaluation by cardiology service.  Her chest x-ray showed no evidence of acute cardiopulmonary abnormalities, BNP 27.  She was noted to be tachycardic with uncontrolled hypertension which was felt to have contributed to her acute hypoxia.  She was diuresed with IV diuresis and her oxygen saturation improved.  Echocardiogram 01/24/2020 showed LVEF 65-70% with G2 DD.  She was instructed to follow a low-sodium diet, use furosemide, weigh daily.  Her chest pain resolved and her Imdur was continued EKG showed sinus tachycardia with right bundle branch block her high-sensitivity troponins were 01-26-12-17.  It was felt that she had ischemia exacerbation related to uncontrolled blood pressure.  Previously noted extensive CAD not amenable to surgery or PCI.  She presents to the clinic today for follow-up evaluation and states***  *** denies chest pain, shortness of breath, lower extremity edema, fatigue, palpitations, melena, hematuria, hemoptysis, diaphoresis, weakness, presyncope, syncope, orthopnea, and PND.   Past Medical History    Past Medical History:  Diagnosis Date  . Blind right eye   . Chronic back pain    "my whole back"  (03/18/2015)  . Chronic chest pain    takes isosorbide  . Chronic diastolic heart failure ([place]) 9/13   echo 03/20/15 LV Ef of 60-65%, grade 2DD and PA peak pressure: 36 mm Hg   . CKD (chronic kidney disease), stage III   . Coronary artery disease    Dr Sallyanne Kuster, cardiac cath 02-07-2009 -- diffuse 3V CAD involving RCA, CFx, and D1,  pt not a candidate for bypass surgery or angioplasty,  medical management  . Diabetic neuropathy ([place])   . Diabetic retinopathy ([place])    bilateral proliferative  . Diastolic CHF, chronic ([place])   . Dyspnea    "always"  . Edema of both lower extremities   . Generalized weakness   . GERD (gastroesophageal reflux disease)   . Glaucoma, both eyes   . History of non-ST elevation myocardial infarction (NSTEMI)    02-25-2009;  05-15-2009;  01-08-2010;  04-04-2012 this MI was post-op surgery for abscess on 04-03-2012  . History of sepsis   . Hyperlipemia   . Hypertension   . Insulin dependent type 2 diabetes mellitus ([place])    followed by pcp  . Iron deficiency anemia due to chronic blood loss    uterine bleeding  . Legally blind in left eye, as defined in [place]    per pt states vision "is foggy"  . Mild obstructive sleep apnea    study in epic 04-19-2012 mild osa,  recommendation's were wt. loss, dental  appliance, surgery, or cpap  . Morbid obesity with BMI of 40.0-44.9, adult ([place])   . OA (osteoarthritis)    knees  . PSVT (paroxysmal supraventricular tachycardia) ([place])   . Renal insufficiency  pt. denies  . Seasonal allergies   . Wears glasses    Past Surgical History:  Procedure Laterality Date  . BIOPSY  02/13/2018   Procedure: BIOPSY;  Surgeon: Ladene Artist, MD;  Location: [facility];  Service: Endoscopy;;  . BREAST SURGERY Right 2019    breast biopsy,  per pt benign  . CARDIAC CATHETERIZATION  02-27-2009   dr al little   diffuse 3V CAD , involving RCA, CFx, and D1;  inferior wall abnormalities, low normal ef 50%  . CATARACT EXTRACTION Right    . CATARACT EXTRACTION W/ INTRAOCULAR LENS IMPLANT Left 10-11-2007   _0   . COLONOSCOPY WITH PROPOFOL N/A 02/13/2018   Procedure: COLONOSCOPY WITH PROPOFOL;  Surgeon: Ladene Artist, MD;  Location: [facility];  Service: Endoscopy;  Laterality: N/A;  . DILATION AND CURETTAGE OF UTERUS  x2 last one 2000  . EYE SURGERY Bilateral 2009 to 2010   x2  left eye;  x2  right eye   . HYSTEROSCOPY WITH D & C N/A 10/11/2018   Procedure: DILATATION AND CURETTAGE /HYSTEROSCOPY;  Surgeon: [name], MD;  Location: [facility];  Service: Gynecology;  Laterality: N/A;  . INCISION AND DRAINAGE PERIRECTAL ABSCESS  04/03/2012  . INTRAUTERINE DEVICE (IUD) INSERTION  10/11/2018   Procedure: INTRAUTERINE DEVICE (IUD) INSERTION;  Surgeon: [name], MD;  Location: [facility];  Service: Gynecology;;  . IR US GUIDE [place] LEFT  02/13/2018  . IR VENIPUNCTURE 37YRS/OLDER BY MD  02/13/2018  . POLYPECTOMY  02/13/2018   Procedure: POLYPECTOMY;  Surgeon: Ladene Artist, MD;  Location: [facility];  Service: Endoscopy;;    Allergies  Allergies  Allergen Reactions  . Ativan [Lorazepam] Anaphylaxis  . Orange Fruit [Citrus] Anaphylaxis and Other (See Comments)    Pt stated throat swelling, itching    History of Present Illness    ***  Home Medications    Prior to Admission medications   Medication Sig Start Date End Date Taking? Authorizing Provider  Accu-Chek Softclix Lancets lancets Use as instructed for 3 times daily blood glucose monitoring 12/24/19   Ladell Pier, MD  acetaminophen (TYLENOL) 500 MG tablet Take 1 tablet (500 mg total) by mouth every 6 (six) hours as needed. Patient taking differently: Take 500 mg by mouth every 6 (six) hours as needed (pain.).  09/09/16   Clent Demark, PA-C  acetaZOLAMIDE (DIAMOX) 500 MG capsule Take 500 mg by mouth 2 (two) times daily.    [provider]  aspirin EC 81 MG tablet Take 81 mg by mouth daily.    [provider]  atorvastatin  (LIPITOR) 80 MG tablet TAKE 1 TABLET BY MOUTH ONCE DAILY IN THE EVENING AT 6  PM Patient taking differently: Take 80 mg by mouth every evening.  01/22/20   Ladell Pier, MD  bismuth subsalicylate (PEPTO BISMOL) 262 MG/15ML suspension Take 30 mLs by mouth every 6 (six) hours as needed for indigestion.    [provider]  Blood Glucose Monitoring Suppl (ACCU-CHEK AVIVA PLUS) w/Device KIT Used as directed 09/29/18   Ladell Pier, MD  Blood Pressure Monitoring (BLOOD PRESSURE MONITOR/ARM) DEVI 1 each by Does not apply route daily. ICD 10, I10 and I50.32 03/07/17   Funches, Josalyn, MD  brimonidine-timolol (COMBIGAN) 0.2-0.5 % ophthalmic solution Place 1 drop into both eyes 2 (two) times daily.     [provider]  calcium carbonate (TUMS - DOSED IN MG ELEMENTAL CALCIUM) 500 MG chewable tablet Chew 1  tablet by mouth 3 (three) times daily as needed for indigestion.     [provider]  Cyanocobalamin (VITAMIN B-12) 1000 MCG TABS Take 1,000 mcg by mouth daily. 09/15/17   Ladell Pier, MD  diltiazem (DILACOR XR) 240 MG 24 hr capsule Take 1 capsule (240 mg total) by mouth daily. 09/15/17   Ladell Pier, MD  ferrous sulfate 325 (65 FE) MG tablet Take 325 mg by mouth daily with breakfast.    [provider]  furosemide (LASIX) 40 MG tablet TAKE 1 TABLET BY MOUTH  TWICE DAILY (TAKE WITH  FUROSEMIDE 80 MG TAB) Patient taking differently: Take 40 mg by mouth 2 (two) times daily. Take along with the 80 mg tablet for total dose of 11m twice daily per patient with 01/16/20   JLadell Pier MD  furosemide (LASIX) 80 MG tablet TAKE 1 TABLET BY MOUTH TWO  TIMES DAILY WITH FUROSEMIDE 40MG  (TOTAL DOSE OF 120MG  TWO TIMES DAILY ) Patient taking differently: Take 80 mg by mouth 2 (two) times daily. Take along with the 40 mg tablet for total dose of 120 mg twice daily per patient. 01/16/20   JLadell Pier MD  glucose blood test strip Use TID before meals / Dx  E11.9 09/29/18   JLadell Pier MD  glucose monitoring kit (FREESTYLE) monitoring kit 1 each by Does not apply route 4 (four) times daily - after meals and at bedtime. 09/12/15   Funches, JAdriana Mccallum MD  insulin lispro protamine-lispro (HUMALOG MIX 75/25) (75-25) 100 UNIT/ML SUSP injection INJECT SUBCUTANEOUSLY 54  UNITS TWICE DAILY WITH  MEALS. Please make PCP appointment. Patient taking differently: Inject 54 Units into the skin 2 (two) times daily with a meal.  12/24/19   JLadell Pier MD  isosorbide mononitrate (IMDUR) 120 MG 24 hr tablet TAKE 1 TABLET BY MOUTH ONCE DAILY 11/19/19   JLadell Pier MD  losartan (COZAAR) 50 MG tablet Take 2 tablets by mouth once daily 04/04/19   JLadell Pier MD  LUMIGAN 0.01 % SOLN Place 1 drop into the right eye at bedtime.  03/06/14   [provider]  megestrol (MEGACE) 40 MG tablet Take 1 tablet (40 mg total) by mouth 2 (two) times daily. 08/27/19   PDonnamae Jude MD  Menthol-Methyl Salicylate (MUSCLE RUB EX) Apply 1 application topically daily as needed (knee pain).     [provider]  metoprolol (TOPROL-XL) 200 MG 24 hr tablet Take 1 tablet by mouth once daily 11/19/19   Croitoru, Mihai, MD  nitroGLYCERIN (NITROSTAT) 0.4 MG SL tablet Place 1 tablet (0.4 mg total) under the tongue every 5 (five) minutes as needed for chest pain. Max 3 doses in 15 minutes. <PLEASE MAKE APPOINTMENT FOR REFILLS> 11/23/19   Croitoru, Mihai, MD  pantoprazole (PROTONIX) 40 MG tablet TAKE ONE TABLET BY MOUTH ONCE DAILY Patient taking differently: Take 40 mg by mouth daily.  01/13/16   Funches, JAdriana Mccallum MD  potassium chloride SA (KLOR-CON) 20 MEQ tablet TAKE 2 TABLETS BY MOUTH IN  THE MORNING AND 1 TABLET IN THE EVENING Patient taking differently: Take 20-40 mEq by mouth See admin instructions. TAKE 2 TABLETS BY MOUTH IN  THE MORNING AND 1 TABLET IN THE EVENING 10/30/19   Croitoru, Mihai, MD    Family History    Family History  Problem Relation Age of  Onset  . Diabetes Mother   . Heart disease Mother   . Heart attack Mother   .  Hypertension Mother   . Breast cancer Mother   . Colon polyps Father   . Diabetes Father   . Heart disease Father   . Heart attack Father   . Stroke Father   . Hypertension Father   . Diabetes Brother   . Diabetes Sister   . Heart disease Brother   . Heart disease Sister   . Diabetes Maternal Grandmother   . Hypertension Maternal Grandmother   . Hypertension Brother   . Hypertension Sister   . Breast cancer Cousin   . Colon cancer Cousin   . Esophageal cancer Neg Hx   . Rectal cancer Neg Hx   . Stomach cancer Neg Hx    She indicated that her mother is deceased. She indicated that her father is deceased. She indicated that the status of her maternal grandmother is unknown. She indicated that one of her two cousins is deceased. She indicated that the status of her neg hx is unknown.  Social History    Social History   Socioeconomic History  . Marital status: Married    Spouse name: Not on file  . Number of children: 0  . Years of education: Not on file  . Highest education level: Not on file  Occupational History  . Occupation: disabled  Tobacco Use  . Smoking status: Never Smoker  . Smokeless tobacco: Never Used  Vaping Use  . Vaping Use: Never used  Substance and Sexual Activity  . Alcohol use: No  . Drug use: Never  . Sexual activity: Yes    Birth control/protection: None    Comment: Married  Other Topics Concern  . Not on file  Social History Narrative  . Not on file   Social Determinants of Health   Financial Resource Strain:   . Difficulty of Paying Living Expenses:   Food Insecurity:   . Worried About Charity fundraiser in the Last Year:   . Arboriculturist in the Last Year:   Transportation Needs:   . Film/video editor (Medical):   Marland Kitchen Lack of Transportation (Non-Medical):   Physical Activity:   . Days of Exercise per Week:   . Minutes of Exercise per Session:     Stress:   . Feeling of Stress :   Social Connections:   . Frequency of Communication with Friends and Family:   . Frequency of Social Gatherings with Friends and Family:   . Attends Religious Services:   . Active Member of Clubs or Organizations:   . Attends Archivist Meetings:   Marland Kitchen Marital Status:   Intimate Partner Violence:   . Fear of Current or Ex-Partner:   . Emotionally Abused:   Marland Kitchen Physically Abused:   . Sexually Abused:      Review of Systems    General:  No chills, fever, night sweats or weight changes.  Cardiovascular:  No chest pain, dyspnea on exertion, edema, orthopnea, palpitations, paroxysmal nocturnal dyspnea. Dermatological: No rash, lesions/masses Respiratory: No cough, dyspnea Urologic: No hematuria, dysuria Abdominal:   No nausea, vomiting, diarrhea, bright red blood per rectum, melena, or hematemesis Neurologic:  No visual changes, wkns, changes in mental status. All other systems reviewed and are otherwise negative except as noted above.  Physical Exam    VS:  LMP 04/02/2012  , BMI There is no height or weight on file to calculate BMI. GEN: Well nourished, well developed, in no acute distress. HEENT: normal. Neck: Supple, no JVD, carotid bruits, or masses.  Cardiac: RRR, no murmurs, rubs, or gallops. No clubbing, cyanosis, edema.  Radials/DP/PT 2+ and equal bilaterally.  Respiratory:  Respirations regular and unlabored, clear to auscultation bilaterally. GI: Soft, nontender, nondistended, BS + x 4. MS: no deformity or atrophy. Skin: warm and dry, no rash. Neuro:  Strength and sensation are intact. Psych: Normal affect.  Accessory Clinical Findings    ECG personally reviewed by me today- *** - No acute changes  EKG 01/24/2020 Sinus tachycardia right from branch block 103 bpm  Echocardiogram 01/24/2020 IMPRESSIONS    1. Left ventricular ejection fraction, by estimation, is 65 to 70%. The  left ventricle has normal function. The left  ventricle has no regional  wall motion abnormalities. There is mild left ventricular hypertrophy.  Left ventricular diastolic parameters  are consistent with Grade II diastolic dysfunction (pseudonormalization).  Elevated left atrial pressure.  2. Right ventricular systolic function is normal. The right ventricular  size is normal. There is normal pulmonary artery systolic pressure. The  estimated right ventricular systolic pressure is 67.5 mmHg.  3. The mitral valve is normal in structure. No evidence of mitral valve  regurgitation.  4. The aortic valve was not well visualized. Aortic valve regurgitation  is not visualized. No aortic stenosis is present.  5. The inferior vena cava is normal in size with <50% respiratory  variability, suggesting right atrial pressure of 8 mmHg.  Assessment & Plan   1.  Acute on chronic diastolic CHF-euvolemic today.  Weight***.  No increased DOE or activity intolerance.  Feeling much better since being discharged from the hospital. Continue furosemide, diltiazem, Imdur, losartan, metoprolol, potassium Heart healthy low-sodium diet-salty 6 given Increase physical activity as tolerated Daily weights-contact office with weight gain of 3 pounds overnight or 5 pounds in 1 week.  (Patient given a scale from heart failure clinic and was mailed a BP cuff) Lower extremity support stockings Elevate extremities when not active  Coronary artery disease-no chest pain today.  Had mildly elevated troponins on prior admission.  Has known CAD (not a candidate for PCI/CABG) Continue furosemide, Imdur, losartan, metoprolol, diltiazem, potassium, atorvastatin, aspirin Heart healthy low-sodium diet-salty 6 given Increase physical activity as tolerated  Chronic kidney disease stage III-creatinine 1.25, BUN 14 on 01/25/2020 Followed by nephrology  Disposition: Follow-up with Dr. Sallyanne Kuster in 3 months.   Jossie Ng. Malaina Mortellaro NP-C    02/05/2020, 1:25 PM [facility] [place] [address] Office [phone] Fax [phone]

## 2020-02-06 ENCOUNTER — Ambulatory Visit: Payer: Medicare Other | Admitting: General Practice

## 2020-02-08 ENCOUNTER — Other Ambulatory Visit: Payer: Self-pay

## 2020-02-08 ENCOUNTER — Ambulatory Visit: Payer: Medicare Other | Attending: Internal Medicine | Admitting: Internal Medicine

## 2020-02-08 ENCOUNTER — Encounter: Payer: Self-pay | Admitting: Internal Medicine

## 2020-02-08 ENCOUNTER — Ambulatory Visit (HOSPITAL_BASED_OUTPATIENT_CLINIC_OR_DEPARTMENT_OTHER): Payer: Medicare Other | Admitting: Licensed Clinical Social Worker

## 2020-02-08 VITALS — BP 147/84 | HR 80 | Temp 98.1°F | Resp 16 | Wt 284.0 lb

## 2020-02-08 DIAGNOSIS — R42 Dizziness and giddiness: Secondary | ICD-10-CM

## 2020-02-08 DIAGNOSIS — R269 Unspecified abnormalities of gait and mobility: Secondary | ICD-10-CM

## 2020-02-08 DIAGNOSIS — I5032 Chronic diastolic (congestive) heart failure: Secondary | ICD-10-CM

## 2020-02-08 DIAGNOSIS — Z09 Encounter for follow-up examination after completed treatment for conditions other than malignant neoplasm: Secondary | ICD-10-CM | POA: Diagnosis not present

## 2020-02-08 DIAGNOSIS — Z794 Long term (current) use of insulin: Secondary | ICD-10-CM

## 2020-02-08 DIAGNOSIS — I10 Essential (primary) hypertension: Secondary | ICD-10-CM

## 2020-02-08 DIAGNOSIS — E1159 Type 2 diabetes mellitus with other circulatory complications: Secondary | ICD-10-CM

## 2020-02-08 LAB — GLUCOSE, POCT (MANUAL RESULT ENTRY): POC Glucose: 164 mg/dl — AB (ref 70–99)

## 2020-02-08 MED ORDER — DAPAGLIFLOZIN PROPANEDIOL 5 MG PO TABS: 5.0000 mg | ORAL_TABLET | Freq: Every day | ORAL | 4 refills | Status: DC

## 2020-02-08 NOTE — Progress Notes (Signed)
Patient ID: [name], female    DOB: Dec 21, 1955  MRN: [id number]  CC: Transition of care Date of hospitalization 6/23-20 01/2020 Date of call with case worker 01/28/2020  Subjective: [name] is a 64 y.o. female who presents for hosp f/u Her concerns today include:  Pt with hx of DM with probable gastroparesis,retinopathy,HTN, HL, CAD, chronic diastolic CHF, obesity,IDA, OA knees, Vit B 12 def, CKD 3(followed by [facility]), legally blind RT eye, CTS BL.  Patient was hospitalized with acute respiratory failure with hypoxia secondary to decompensated heart failure in the presence of hypertension.  She also complained of chest pain.  She presented with O2 sat of 88% and complaining of shortness of breath and swelling in the legs.  Chest x-ray revealed no evidence of acute cardiopulmonary abnormality.  BNP was 27.  EKG revealed sinus tachycardia with right bundle branch block.  Cardiology felt that she likely had ischemia exacerbated by uncontrolled blood pressure.  She reportedly has extensive CAD not amenable to surgical or percutaneous revascularization.  Blood pressure was elevated.  Patient was diuresed and was eventually able to be taken off O2 with saturation of 100% on room air.  She was resumed on her home dose of Lasix of 120 mg twice a day.  Echo revealed left ventricular EF of 65-70% with grade 2 diastolic dysfunction.  She was seen by cardiology.  She was continued on salt restriction, home diuretic dose and given a scale to weigh herself daily.  Today: Weight is up 8 pounds since hospitalization.  Reports her breathing is better and swelling has decreased in the legs.  She limits salt in the foods.  Compliant with medications including her diuretics.  She has a new blood pressure monitoring device but not sure how to use it.  She has not taken all of her blood pressure medicines as yet for today. -Has follow-up visit with cardiologist Dr. Sallyanne Kuster on  03/13/2020.  DM: She reports compliance with taking insulin 54 units twice a day.  She checks her blood sugars in the mornings.  She tells me her range has been around 190.  She does not have a log with her.  Gait disturbance/dizziness: Referred for physical therapy on last visit with me.  She has been going to physical therapy and has found it useful.  However she was told to be seen in the ER on her last visit with physical therapy because blood pressure was extremely elevated.  She admits that she did not take her medicines before going to physical therapy.  Her next appointment is next week.  I had scheduled an MRI of the head but patient apparently canceled the appointment for the MRI of the head and for the pelvic ultrasound.  She was called for appointment for mammogram but she has not called them back.  States she will do so when she is made up her mind to have it done.  She screened positive for depression on her last visit with [name].  However patient states that she is not depressed.  She thinks she filled out the screening wrong because she has difficulty with her eyesight.  Patient Active Problem List   Diagnosis Date Noted  . Acute respiratory failure with hypoxia ([place]) 01/24/2020  . Chest pain 01/23/2020  . Acute exacerbation of CHF (congestive heart failure) ([place]) 01/23/2020  . Functional urinary incontinence 09/07/2019  . Urge incontinence of urine 09/07/2019  . Legally blind in right eye, as defined in [place]  08/11/2018  . Bilateral carpal tunnel syndrome 04/13/2018  . Adenomatous polyp of colon 04/13/2018  . Fibrocystic breast changes, right 12/12/2017  . Drug-induced constipation 09/15/2017  . Primary osteoarthritis of both knees 04/05/2017  . Chronic bilateral low back pain 03/07/2017  . Chronic pain of right knee 03/07/2017  . Vitamin B12 deficiency 12/09/2016  . Incidental lung nodule, > 80m and < 817m02/13/2018  . Vitreous hemorrhage of right eye ([facility]  . Diabetic  gastroparesis associated with type 2 diabetes mellitus ([facility]02/05/2016  . Controlled type 2 diabetes mellitus with complication, with long-term current use of insulin ([facility] AF[facility]02/05/2016  . Chronic renal insufficiency, stage III (moderate) 03/19/2015  . Prolonged Q-T interval on ECG 08/10/2013  . Dyslipidemia 04/02/2013  . Acute on chronic diastolic heart failure (HCT[place]09/01/2012  . Presumed OSA (obstructive sleep apnea) 04/06/2012  . NSTEMI  post-op 04/04/12-medical Rx 04/04/2012  . Severe obesity (BMI >= 40) (HCHask[place]06/17/2013  . GERD 08/21/2009  . Complex endometrial hyperplasia with atypia 03/20/2009  . PERIPHERAL EDEMA 03/20/2009  . Coronary atherosclerosis-not a surgical or PCI candidate 2010 02/27/2009  . Proliferative diabetic retinopathy (HCKenn[place]03/01/2008  . DETACHED RETINA, BILATERAL, HX OF 09/07/2007  . History of chronic Iron defeicency anemia with acute post op anemia, transfused after debridment 04/04/12 04/17/2007  . Essential hypertension 04/17/2007     Current Outpatient Medications on File Prior to Visit  Medication Sig Dispense Refill  . Accu-Chek Softclix Lancets lancets Use as instructed for 3 times daily blood glucose monitoring 100 each 5  . acetaminophen (TYLENOL) 500 MG tablet Take 1 tablet (500 mg total) by mouth every 6 (six) hours as needed. (Patient taking differently: Take 500 mg by mouth every 6 (six) hours as needed (pain.). ) 30 tablet 0  . acetaZOLAMIDE (DIAMOX) 500 MG capsule Take 500 mg by mouth 2 (two) times daily.    . Marland Kitchenspirin EC 81 MG tablet Take 81 mg by mouth daily.    . Marland Kitchentorvastatin (LIPITOR) 80 MG tablet TAKE 1 TABLET BY MOUTH ONCE DAILY IN THE EVENING AT 6  PM (Patient taking differently: Take 80 mg by mouth every evening. ) 90 tablet 3  . bismuth subsalicylate (PEPTO BISMOL) 262 MG/15ML suspension Take 30 mLs by mouth every 6 (six) hours as needed for indigestion.    . Blood Glucose Monitoring Suppl (ACCU-CHEK AVIVA PLUS) w/Device KIT Used as directed 1  kit 0  . Blood Pressure Monitoring (BLOOD PRESSURE MONITOR/ARM) DEVI 1 each by Does not apply route daily. ICD 10, I10 and I50.32 1 Device 0  . brimonidine-timolol (COMBIGAN) 0.2-0.5 % ophthalmic solution Place 1 drop into both eyes 2 (two) times daily.     . calcium carbonate (TUMS - DOSED IN MG ELEMENTAL CALCIUM) 500 MG chewable tablet Chew 1 tablet by mouth 3 (three) times daily as needed for indigestion.     . Cyanocobalamin (VITAMIN B-12) 1000 MCG TABS Take 1,000 mcg by mouth daily. 90 tablet 1  . diltiazem (DILACOR XR) 240 MG 24 hr capsule Take 1 capsule (240 mg total) by mouth daily. 90 capsule 3  . ferrous sulfate 325 (65 FE) MG tablet Take 325 mg by mouth daily with breakfast.    . furosemide (LASIX) 40 MG tablet TAKE 1 TABLET BY MOUTH  TWICE DAILY (TAKE WITH  FUROSEMIDE 80 MG TAB) (Patient taking differently: Take 40 mg by mouth 2 (two) times daily. Take along with the 80 mg tablet for total dose of 12062mwice daily per patient  with) 180 tablet 3  . furosemide (LASIX) 80 MG tablet TAKE 1 TABLET BY MOUTH TWO  TIMES DAILY WITH FUROSEMIDE 40MG  (TOTAL DOSE OF 120MG  TWO TIMES DAILY ) (Patient taking differently: Take 80 mg by mouth 2 (two) times daily. Take along with the 40 mg tablet for total dose of 120 mg twice daily per patient.) 180 tablet 3  . glucose blood test strip Use TID before meals / Dx E11.9 100 each 12  . glucose monitoring kit (FREESTYLE) monitoring kit 1 each by Does not apply route 4 (four) times daily - after meals and at bedtime. 1 each 1  . insulin lispro protamine-lispro (HUMALOG MIX 75/25) (75-25) 100 UNIT/ML SUSP injection INJECT SUBCUTANEOUSLY 54  UNITS TWICE DAILY WITH  MEALS. Please make PCP appointment. (Patient taking differently: Inject 54 Units into the skin 2 (two) times daily with a meal. ) 30 mL 0  . isosorbide mononitrate (IMDUR) 120 MG 24 hr tablet TAKE 1 TABLET BY MOUTH ONCE DAILY 90 tablet 3  . losartan (COZAAR) 50 MG tablet Take 2 tablets by mouth once  daily 180 tablet 0  . LUMIGAN 0.01 % SOLN Place 1 drop into the right eye at bedtime.     . megestrol (MEGACE) 40 MG tablet Take 1 tablet (40 mg total) by mouth 2 (two) times daily. 60 tablet 11  . Menthol-Methyl Salicylate (MUSCLE RUB EX) Apply 1 application topically daily as needed (knee pain).     . metoprolol (TOPROL-XL) 200 MG 24 hr tablet Take 1 tablet by mouth once daily 90 tablet 0  . nitroGLYCERIN (NITROSTAT) 0.4 MG SL tablet Place 1 tablet (0.4 mg total) under the tongue every 5 (five) minutes as needed for chest pain. Max 3 doses in 15 minutes. <PLEASE MAKE APPOINTMENT FOR REFILLS> 25 tablet 1  . pantoprazole (PROTONIX) 40 MG tablet TAKE ONE TABLET BY MOUTH ONCE DAILY (Patient taking differently: Take 40 mg by mouth daily. ) 30 tablet 2  . potassium chloride SA (KLOR-CON) 20 MEQ tablet TAKE 2 TABLETS BY MOUTH IN  THE MORNING AND 1 TABLET IN THE EVENING (Patient taking differently: Take 20-40 mEq by mouth See admin instructions. TAKE 2 TABLETS BY MOUTH IN  THE MORNING AND 1 TABLET IN THE EVENING) 270 tablet 3   No current facility-administered medications on file prior to visit.    Allergies  Allergen Reactions  . Ativan [Lorazepam] Anaphylaxis  . Orange Fruit [Citrus] Anaphylaxis and Other (See Comments)    Pt stated throat swelling, itching    Social History   Socioeconomic History  . Marital status: Married    Spouse name: Not on file  . Number of children: 0  . Years of education: Not on file  . Highest education level: Not on file  Occupational History  . Occupation: disabled  Tobacco Use  . Smoking status: Never Smoker  . Smokeless tobacco: Never Used  Vaping Use  . Vaping Use: Never used  Substance and Sexual Activity  . Alcohol use: No  . Drug use: Never  . Sexual activity: Yes    Birth control/protection: None    Comment: Married  Other Topics Concern  . Not on file  Social History Narrative  . Not on file   Social Determinants of Health   Financial  Resource Strain:   . Difficulty of Paying Living Expenses:   Food Insecurity:   . Worried About Charity fundraiser in the Last Year:   . [employer] of  Food in the Last Year:   Transportation Needs:   . Film/video editor (Medical):   Marland Kitchen Lack of Transportation (Non-Medical):   Physical Activity:   . Days of Exercise per Week:   . Minutes of Exercise per Session:   Stress:   . Feeling of Stress :   Social Connections:   . Frequency of Communication with Friends and Family:   . Frequency of Social Gatherings with Friends and Family:   . Attends Religious Services:   . Active Member of Clubs or Organizations:   . Attends Archivist Meetings:   Marland Kitchen Marital Status:   Intimate Partner Violence:   . Fear of Current or Ex-Partner:   . Emotionally Abused:   Marland Kitchen Physically Abused:   . Sexually Abused:     Family History  Problem Relation Age of Onset  . Diabetes Mother   . Heart disease Mother   . Heart attack Mother   . Hypertension Mother   . Breast cancer Mother   . Colon polyps Father   . Diabetes Father   . Heart disease Father   . Heart attack Father   . Stroke Father   . Hypertension Father   . Diabetes Brother   . Diabetes Sister   . Heart disease Brother   . Heart disease Sister   . Diabetes Maternal Grandmother   . Hypertension Maternal Grandmother   . Hypertension Brother   . Hypertension Sister   . Breast cancer Cousin   . Colon cancer Cousin   . Esophageal cancer Neg Hx   . Rectal cancer Neg Hx   . Stomach cancer Neg Hx     Past Surgical History:  Procedure Laterality Date  . BIOPSY  02/13/2018   Procedure: BIOPSY;  Surgeon: Ladene Artist, MD;  Location: [facility];  Service: Endoscopy;;  . BREAST SURGERY Right 2019    breast biopsy,  per pt benign  . CARDIAC CATHETERIZATION  02-27-2009   dr al little   diffuse 3V CAD , involving RCA, CFx, and D1;  inferior wall abnormalities, low normal ef 50%  . CATARACT EXTRACTION Right   . CATARACT  EXTRACTION W/ INTRAOCULAR LENS IMPLANT Left 10-11-2007   '@[facility]'   . COLONOSCOPY WITH PROPOFOL N/A 02/13/2018   Procedure: COLONOSCOPY WITH PROPOFOL;  Surgeon: Ladene Artist, MD;  Location: [facility];  Service: Endoscopy;  Laterality: N/A;  . DILATION AND CURETTAGE OF UTERUS  x2 last one 2000  . EYE SURGERY Bilateral 2009 to 2010   x2  left eye;  x2  right eye   . HYSTEROSCOPY WITH D & C N/A 10/11/2018   Procedure: DILATATION AND CURETTAGE /HYSTEROSCOPY;  Surgeon: [name], MD;  Location: [facility];  Service: Gynecology;  Laterality: N/A;  . INCISION AND DRAINAGE PERIRECTAL ABSCESS  04/03/2012  . INTRAUTERINE DEVICE (IUD) INSERTION  10/11/2018   Procedure: INTRAUTERINE DEVICE (IUD) INSERTION;  Surgeon: [name], MD;  Location: [facility];  Service: Gynecology;;  . IR US GUIDE [place] LEFT  02/13/2018  . IR VENIPUNCTURE 88YRS/OLDER BY MD  02/13/2018  . POLYPECTOMY  02/13/2018   Procedure: POLYPECTOMY;  Surgeon: Ladene Artist, MD;  [facility]: [facility];  Service: Endoscopy;;    ROS: Review of Systems Negative except as stated above  PHYSICAL EXAM: BP (!) 147/84   Pulse 80   Temp 98.1 F (36.7 C)   Resp 16   Wt 284 lb (128.8 kg)   LMP 04/02/2012   SpO2 100%  BMI 50.31 kg/m   Wt Readings from Last 3 Encounters:  02/08/20 284 lb (128.8 kg)  01/26/20 279 lb 15.8 oz (127 kg)  01/23/20 283 lb (128.4 kg)    Physical Exam  General appearance - alert, well appearing, obese older African-American female and in no distress Mental status - normal mood, behavior, speech, dress, motor activity, and thought processes Neck - supple, no significant adenopathy Chest - clear to auscultation, no wheezes, rales or rhonchi, symmetric air entry Heart - normal rate, regular rhythm, normal S1, S2, no murmurs, rubs, clicks or gallops Extremities -trace bilateral lower extremity edema.  1+ edema in the right ankle  Lab Results  Component Value Date   HGBA1C 10.1 (H) 01/25/2020    CMP  Latest Ref Rng & Units 01/25/2020 01/24/2020 01/23/2020  Glucose 70 - 99 mg/dL 173(H) 267(H) 266(H)  BUN 8 - 23 mg/dL '14 12 10  ' Creatinine 0.44 - 1.00 mg/dL 1.25(H) 1.32(H) 1.16(H)  Sodium 135 - 145 mmol/L 139 138 139  Potassium 3.5 - 5.1 mmol/L 3.5 4.7 4.2  Chloride 98 - 111 mmol/L 110 108 108  CO2 22 - 32 mmol/L 19(L) 22 21(L)  Calcium 8.9 - 10.3 mg/dL 8.7(L) 8.8(L) 9.0  Total Protein 6.0 - 8.5 g/dL - - -  Total Bilirubin 0.0 - 1.2 mg/dL - - -  Alkaline Phos 48 - 121 IU/L - - -  AST 0 - 40 IU/L - - -  ALT 0 - 32 IU/L - - -   Lipid Panel     Component Value Date/Time   CHOL 126 01/23/2020 2329   CHOL 131 12/24/2019 1049   TRIG 69 01/23/2020 2329   HDL 31 (L) 01/23/2020 2329   HDL 32 (L) 12/24/2019 1049   CHOLHDL 4.1 01/23/2020 2329   VLDL 14 01/23/2020 2329   LDLCALC 81 01/23/2020 2329   LDLCALC 79 12/24/2019 1049    CBC    Component Value Date/Time   WBC 10.5 01/24/2020 0448   RBC 3.74 (L) 01/24/2020 0448   HGB 11.6 (L) 01/24/2020 0448   HGB 13.3 12/24/2019 1049   HCT 35.8 (L) 01/24/2020 0448   HCT 40.9 12/24/2019 1049   PLT 226 01/24/2020 0448   PLT 244 12/24/2019 1049   MCV 95.7 01/24/2020 0448   MCV 95 12/24/2019 1049   MCH 31.0 01/24/2020 0448   MCHC 32.4 01/24/2020 0448   RDW 13.7 01/24/2020 0448   RDW 11.9 12/24/2019 1049   LYMPHSABS 2.3 01/15/2019 1745   MONOABS 1.2 (H) 01/15/2019 1745   EOSABS 0.1 01/15/2019 1745   BASOSABS 0.1 01/15/2019 1745    ASSESSMENT AND PLAN: 1. Hospital discharge follow-up 2. Chronic diastolic congestive heart failure ([place]) Her weight is up but on exam she does not examine as being decompensated.  Encouraged her to continue current medications including her furosemide.  3. Essential hypertension Blood pressure not at goal but she has not taken all of her medicines as yet for the morning.  Advised her to take her blood pressure medicines and eat breakfast before going to her  physical therapy appointments  4. Type 2 diabetes  mellitus with other circulatory complication, with long-term current use of insulin ([facility]) Not at goal.  She will continue Humalog 75/25 54 units twice a day.  Add [place].  Refer to endocrinology to help with management - POCT glucose (manual entry) - dapagliflozin propanediol (FARXIGA) 5 MG TABS tablet; Take 1 tablet (5 mg total) by mouth daily.  Dispense: 30 tablet; Refill:  4 - Ambulatory referral to Endocrinology  5. Gait disturbance 6. Dizziness Patient to continue with physical therapy.  We will have my CMA try to reschedule the MRI of the head.    Patient was given the opportunity to ask questions.  Patient verbalized understanding of the plan and was able to repeat key elements of the plan.   Orders Placed This Encounter  Procedures  . POCT glucose (manual entry)     Requested Prescriptions    No prescriptions requested or ordered in this encounter    No follow-ups on file.  Karle Plumber, MD, FACP

## 2020-02-08 NOTE — Patient Instructions (Signed)
We have started a new diabetic medication called [place].  I have sent that prescription to your [place].  We have referred you to the endocrinologist to help with management of your diabetes.  Please keep a log of your blood sugar readings and take them with you when you see the specialist.  Please call mammography back to schedule your mammogram.  When you go to physical therapy be sure to take your blood pressure medications before going.

## 2020-02-08 NOTE — Progress Notes (Signed)
Pt states she is

## 2020-02-10 ENCOUNTER — Other Ambulatory Visit: Payer: Self-pay | Admitting: Internal Medicine

## 2020-02-10 NOTE — Telephone Encounter (Signed)
Syringes not on active med list- routing back to office for review.

## 2020-02-12 ENCOUNTER — Telehealth: Payer: Self-pay | Admitting: Internal Medicine

## 2020-02-12 DIAGNOSIS — Z794 Long term (current) use of insulin: Secondary | ICD-10-CM

## 2020-02-12 MED ORDER — HUMALOG MIX 75/25 (75-25) 100 UNIT/ML ~~LOC~~ SUSP: SUBCUTANEOUS | 2 refills | Status: DC

## 2020-02-12 NOTE — Telephone Encounter (Signed)
Invokana is not covered. We ran a test claim for Jardiance and this goes through for 146.00 for a 30-day which is a little cheaper than the [place].

## 2020-02-12 NOTE — Telephone Encounter (Signed)
Pt is calling and she can not afford farxiga 30 day supply cost is 187.00. Pt would like to go back on her insulin. Pt seen dr Wynetta Emery on 02-08-2020. [facility] [address]

## 2020-02-12 NOTE — Telephone Encounter (Signed)
Dr. Wynetta Emery -   Pt cannot afford Farxiga. I sent refills to her [facility] for her insulin per pt request. Wanted to make you aware that she is not starting the [place].

## 2020-02-14 NOTE — BH Specialist Note (Signed)
Integrated Behavioral Health Initial Visit  MRN: [id number] Name: DEMI TRIEU  Number of [place] Clinician visits:: 1/6 Session Start time: 12:05 PM  Session End time: 12:15 PM Total time: 10  Type of Service: [name] Interpretor:No. Interpretor Name and Language: NA   Warm Hand Off Completed.       SUBJECTIVE: Cherise ARLETH MCCULLAR is a 64 y.o. female accompanied by self Patient was referred by Dr. Wynetta Emery for positive phq9 screen. Patient reports the following symptoms/concerns: Pt denies experiencing symptoms of depression or anxiety. Pt shared that she is in need of walker to decrease risk of falling Duration of problem: NA; Severity of problem: NA  OBJECTIVE: Mood: Pleasant and Affect: Appropriate Risk of harm to self or others: No plan to harm self or others  LIFE CONTEXT: Family and Social: Pt receives strong support from family and friends School/Work: Pt is insured Self-Care: Pt identified healthy coping skills Life Changes: Pt is in need of walker  GOALS ADDRESSED: Patient will: 1. Reduce symptoms of: stress 2. Increase knowledge and/or ability of: healthy habits  3. Demonstrate ability to: Increase healthy adjustment to current life circumstances  INTERVENTIONS: Interventions utilized: Solution-Focused Strategies  Standardized Assessments completed: GAD-7 and PHQ 2&9  ASSESSMENT: Patient denies symptoms of anxiety or depression. Shared that she has a strong support system and successfully identified healthy coping skills.   Patient may benefit from linkage to community resources. Pt requests walker to decrease fall risk. LCSW informed patient of [facility] [place]. Pt prefers for LCSW to contact agency about an available walker. LCSW will update patient of resources.   PLAN: 1. Follow up with behavioral health clinician on :LCSW will follow up with patient regarding walker 2. Behavioral  recommendations: Continue to utilize healthy coping skills 3. Referral(s): [place] (In Clinic) and Community Resources:  DME 4. "From scale of 1-10, how likely are you to follow plan?":   Rebekah [place], LCSW 02/14/2020 12:23 AM

## 2020-02-20 ENCOUNTER — Ambulatory Visit: Payer: Medicare Other | Admitting: Physical Therapy

## 2020-02-28 ENCOUNTER — Other Ambulatory Visit: Payer: Self-pay

## 2020-02-28 ENCOUNTER — Encounter: Payer: Self-pay | Admitting: Family

## 2020-02-28 ENCOUNTER — Ambulatory Visit: Payer: Medicare Other | Attending: Family | Admitting: Family

## 2020-02-28 VITALS — BP 152/80 | HR 87 | Resp 16 | Ht 63.0 in | Wt 280.4 lb

## 2020-02-28 DIAGNOSIS — Z13820 Encounter for screening for osteoporosis: Secondary | ICD-10-CM

## 2020-02-28 DIAGNOSIS — Z1211 Encounter for screening for malignant neoplasm of colon: Secondary | ICD-10-CM

## 2020-02-28 DIAGNOSIS — Z0000 Encounter for general adult medical examination without abnormal findings: Secondary | ICD-10-CM

## 2020-02-28 NOTE — Patient Instructions (Signed)
°  [name] , Thank you for taking time to come for your Medicare Wellness Visit. I appreciate your ongoing commitment to your health goals. Please review the following plan we discussed and let me know if I can assist you in the future.   These are the goals we discussed: Goals     Blood Pressure < 140/90     Exercise 3x per week (30 min per time)     Inquire about silver sneakers at your local [employer]      HEMOGLOBIN A1C < 7.0       This is a list of the screening recommended for you and due dates:  Health Maintenance  Topic Date Due   Complete foot exam   11/26/2017   Mammogram  11/26/2019   DEXA scan (bone density measurement)  Never done   Cologuard (Stool DNA test)  02/22/2020   Flu Shot  03/02/2020   Hemoglobin A1C  07/26/2020   Eye exam for diabetics  11/05/2020   Pap Smear  07/10/2021   Pneumonia vaccines (2 of 2 - PPSV23) 11/26/2021   Tetanus Vaccine  09/16/2027   COVID-19 Vaccine  Completed    Hepatitis C: One time screening is recommended by Center for Disease Control  (CDC) for  adults born from [birth year] through 1966.   Completed   HIV Screening  Completed

## 2020-02-28 NOTE — Progress Notes (Signed)
Subjective:    [name] is a 64 y.o. female who presents for a Welcome to Medicare exam.   Review of Systems     Objective:    Today's Vitals   02/28/20 1109  BP: (!) 152/80  Pulse: 87  Resp: 16  SpO2: 99%  Weight: (!) 280 lb 6.4 oz (127.2 kg)  Height: _0  (1.6 m)  Body mass index is 49.67 kg/m.   Blood pressure elevated today. Reports she does not take blood pressure medication when she has to be out because she does not want any side effects such as dizziness. Reports she will medications once she returns home. Patient asymptomatic without chest pressure, chest pain, palpitations, and shortness of breath.  Medications Outpatient Encounter Medications as of 02/28/2020  Medication Sig  . Accu-Chek Softclix Lancets lancets Use as instructed for 3 times daily blood glucose monitoring  . acetaminophen (TYLENOL) 500 MG tablet Take 1 tablet (500 mg total) by mouth every 6 (six) hours as needed. (Patient taking differently: Take 500 mg by mouth every 6 (six) hours as needed (pain.). )  . acetaZOLAMIDE (DIAMOX) 500 MG capsule Take 500 mg by mouth 2 (two) times daily.  Marland Kitchen aspirin EC 81 MG tablet Take 81 mg by mouth daily.  Marland Kitchen atorvastatin (LIPITOR) 80 MG tablet TAKE 1 TABLET BY MOUTH ONCE DAILY IN THE EVENING AT 6  PM (Patient taking differently: Take 80 mg by mouth every evening. )  . bismuth subsalicylate (PEPTO BISMOL) 262 MG/15ML suspension Take 30 mLs by mouth every 6 (six) hours as needed for indigestion.  . Blood Glucose Monitoring Suppl (ACCU-CHEK AVIVA PLUS) w/Device KIT Used as directed  . Blood Pressure Monitoring (BLOOD PRESSURE MONITOR/ARM) DEVI 1 each by Does not apply route daily. ICD 10, I10 and I50.32  . brimonidine-timolol (COMBIGAN) 0.2-0.5 % ophthalmic solution Place 1 drop into both eyes 2 (two) times daily.   . calcium carbonate (TUMS - DOSED IN MG ELEMENTAL CALCIUM) 500 MG chewable tablet Chew 1 tablet by mouth 3 (three) times daily as needed for  indigestion.   . Cyanocobalamin (VITAMIN B-12) 1000 MCG TABS Take 1,000 mcg by mouth daily.  . dapagliflozin propanediol (FARXIGA) 5 MG TABS tablet Take 1 tablet (5 mg total) by mouth daily. (Patient not taking: Reported on 02/28/2020)  . diltiazem (DILACOR XR) 240 MG 24 hr capsule Take 1 capsule (240 mg total) by mouth daily.  . ferrous sulfate 325 (65 FE) MG tablet Take 325 mg by mouth daily with breakfast.  . furosemide (LASIX) 40 MG tablet TAKE 1 TABLET BY MOUTH  TWICE DAILY (TAKE WITH  FUROSEMIDE 80 MG TAB) (Patient taking differently: Take 40 mg by mouth 2 (two) times daily. Take along with the 80 mg tablet for total dose of 118m twice daily per patient with)  . furosemide (LASIX) 80 MG tablet TAKE 1 TABLET BY MOUTH TWO  TIMES DAILY WITH FUROSEMIDE 40MG  (TOTAL DOSE OF 120MG  TWO TIMES DAILY ) (Patient taking differently: Take 80 mg by mouth 2 (two) times daily. Take along with the 40 mg tablet for total dose of 120 mg twice daily per patient.)  . glucose blood test strip Use TID before meals / Dx E11.9  . glucose monitoring kit (FREESTYLE) monitoring kit 1 each by Does not apply route 4 (four) times daily - after meals and at bedtime.  . insulin lispro protamine-lispro (HUMALOG MIX 75/25) (75-25) 100 UNIT/ML SUSP injection INJECT SUBCUTANEOUSLY 54  UNITS TWICE DAILY  WITH  MEALS.  Marland Kitchen isosorbide mononitrate (IMDUR) 120 MG 24 hr tablet TAKE 1 TABLET BY MOUTH ONCE DAILY  . losartan (COZAAR) 50 MG tablet Take 2 tablets by mouth once daily  . LUMIGAN 0.01 % SOLN Place 1 drop into the right eye at bedtime.   . megestrol (MEGACE) 40 MG tablet Take 1 tablet (40 mg total) by mouth 2 (two) times daily.  . Menthol-Methyl Salicylate (MUSCLE RUB EX) Apply 1 application topically daily as needed (knee pain).   . metoprolol (TOPROL-XL) 200 MG 24 hr tablet Take 1 tablet by mouth once daily  . nitroGLYCERIN (NITROSTAT) 0.4 MG SL tablet Place 1 tablet (0.4 mg total) under the tongue every 5 (five) minutes as  needed for chest pain. Max 3 doses in 15 minutes. <PLEASE MAKE APPOINTMENT FOR REFILLS>  . pantoprazole (PROTONIX) 40 MG tablet TAKE ONE TABLET BY MOUTH ONCE DAILY (Patient taking differently: Take 40 mg by mouth daily. )  . potassium chloride SA (KLOR-CON) 20 MEQ tablet TAKE 2 TABLETS BY MOUTH IN  THE MORNING AND 1 TABLET IN THE EVENING (Patient taking differently: Take 20-40 mEq by mouth See admin instructions. TAKE 2 TABLETS BY MOUTH IN  THE MORNING AND 1 TABLET IN THE EVENING)  . RELION INSULIN SYRINGE 31G X 15/64" 1 ML MISC USE AS DIRECTED TWICE DAILY FOR  INSULIN   No facility-administered encounter medications on file as of 02/28/2020.     History: Past Medical History:  Diagnosis Date  . Blind right eye   . Chronic back pain    "my whole back" (03/18/2015)  . Chronic chest pain    takes isosorbide  . Chronic diastolic heart failure ([place]) 9/13   echo 03/20/15 LV Ef of 60-65%, grade 2DD and PA peak pressure: 36 mm Hg   . CKD (chronic kidney disease), stage III   . Coronary artery disease    Dr Sallyanne Kuster, cardiac cath 02-07-2009 -- diffuse 3V CAD involving RCA, CFx, and D1,  pt not a candidate for bypass surgery or angioplasty,  medical management  . Diabetic neuropathy ([place])   . Diabetic retinopathy ([place])    bilateral proliferative  . Diastolic CHF, chronic ([place])   . Dyspnea    "always"  . Edema of both lower extremities   . Generalized weakness   . GERD (gastroesophageal reflux disease)   . Glaucoma, both eyes   . History of non-ST elevation myocardial infarction (NSTEMI)    02-25-2009;  05-15-2009;  01-08-2010;  04-04-2012 this MI was post-op surgery for abscess on 04-03-2012  . History of sepsis   . Hyperlipemia   . Hypertension   . Insulin dependent type 2 diabetes mellitus ([place])    followed by pcp  . Iron deficiency anemia due to chronic blood loss    uterine bleeding  . Legally blind in left eye, as defined in [place]    per pt states vision "is foggy"  . Mild  obstructive sleep apnea    study in epic 04-19-2012 mild osa,  recommendation's were wt. loss, dental  appliance, surgery, or cpap  . Morbid obesity with BMI of 40.0-44.9, adult ([place])   . OA (osteoarthritis)    knees  . PSVT (paroxysmal supraventricular tachycardia) ([place])   . Renal insufficiency    pt. denies  . Seasonal allergies   . Wears glasses    Past Surgical History:  Procedure Laterality Date  . BIOPSY  02/13/2018   Procedure: BIOPSY;  Surgeon: Ladene Artist, MD;  Location:  [facility];  Service: Endoscopy;;  . BREAST SURGERY Right 2019    breast biopsy,  per pt benign  . CARDIAC CATHETERIZATION  02-27-2009   dr al little   diffuse 3V CAD , involving RCA, CFx, and D1;  inferior wall abnormalities, low normal ef 50%  . CATARACT EXTRACTION Right   . CATARACT EXTRACTION W/ INTRAOCULAR LENS IMPLANT Left 10-11-2007   _0   . COLONOSCOPY WITH PROPOFOL N/A 02/13/2018   Procedure: COLONOSCOPY WITH PROPOFOL;  Surgeon: Ladene Artist, MD;  Location: [facility];  Service: Endoscopy;  Laterality: N/A;  . DILATION AND CURETTAGE OF UTERUS  x2 last one 2000  . EYE SURGERY Bilateral 2009 to 2010   x2  left eye;  x2  right eye   . HYSTEROSCOPY WITH D & C N/A 10/11/2018   Procedure: DILATATION AND CURETTAGE /HYSTEROSCOPY;  Surgeon: [name], MD;  Location: [facility];  Service: Gynecology;  Laterality: N/A;  . INCISION AND DRAINAGE PERIRECTAL ABSCESS  04/03/2012  . INTRAUTERINE DEVICE (IUD) INSERTION  10/11/2018   Procedure: INTRAUTERINE DEVICE (IUD) INSERTION;  Surgeon: [name], MD;  Location: [facility];  Service: Gynecology;;  . IR US GUIDE Eclectic LEFT  02/13/2018  . IR VENIPUNCTURE 67YRS/OLDER BY MD  02/13/2018  . POLYPECTOMY  02/13/2018   Procedure: POLYPECTOMY;  Surgeon: Ladene Artist, MD;  Location: [facility];  Service: Endoscopy;;    Family History  Problem Relation Age of Onset  . Diabetes Mother   . Heart disease Mother   . Heart attack Mother   . Hypertension  Mother   . Breast cancer Mother   . Colon polyps Father   . Diabetes Father   . Heart disease Father   . Heart attack Father   . Stroke Father   . Hypertension Father   . Diabetes Brother   . Diabetes Sister   . Heart disease Brother   . Heart disease Sister   . Diabetes Maternal Grandmother   . Hypertension Maternal Grandmother   . Hypertension Brother   . Hypertension Sister   . Breast cancer Cousin   . Colon cancer Cousin   . Esophageal cancer Neg Hx   . Rectal cancer Neg Hx   . Stomach cancer Neg Hx    Social History   Occupational History  . Occupation: disabled  Tobacco Use  . Smoking status: Never Smoker  . Smokeless tobacco: Never Used  Vaping Use  . Vaping Use: Never used  Substance and Sexual Activity  . Alcohol use: No  . Drug use: Never  . Sexual activity: Yes    Birth control/protection: None    Comment: Married   Tobacco Counseling  Denies history of smoking.   Immunizations and Health Maintenance Immunization History  Administered Date(s) Administered  . Influenza Whole 05/22/2009  . Influenza,inj,Quad PF,6+ Mos 07/19/2014, 09/12/2015, 04/09/2016, 09/15/2017, 08/11/2018  . PFIZER SARS-COV-2 Vaccination 10/22/2019, 11/12/2019  . Pneumococcal Conjugate-13 12/24/2019  . Pneumococcal Polysaccharide-23 05/22/2009, 11/26/2016  . Td 08/02/2002  . Tdap 09/15/2017    Reports she has received both doses of Pfizer vaccine on 10/22/2019 and 11/12/2019.  Health Maintenance Due  Topic Date Due  . FOOT EXAM  11/26/2017  . MAMMOGRAM  11/26/2019  . DEXA SCAN  Never done  . Fecal DNA (Cologuard)  02/22/2020    Foot exam last completed on 12/24/2019 with PCP Dr. Wynetta Emery.  Reports she has not had mammogram since 2019. Reports she was supposed to have one this year but  she talked herself out of it because its too painful. Reports she may get it soon.   Reports she is interested in the DEXA scan.   Reports previously had Cologuard which resulted positive in  2018.    Activities of Daily Living In your present state of health, do you have any difficulty performing the following activities: 02/28/2020 01/24/2020  Hearing? N N  Vision? Y N  Difficulty concentrating or making decisions? N N  Walking or climbing stairs? Y Y  Dressing or bathing? Y N  Doing errands, shopping? Y N  Preparing Food and eating ? N -  Using the Toilet? N -  In the past six months, have you accidently leaked urine? N -  Do you have problems with loss of bowel control? N -  Managing your Medications? Y -  Managing your Finances? N -  Housekeeping or managing your Housekeeping? N -  Some recent data might be hidden    Walking/climbing difficulty- reports there are no rails on the 4 steps leading inside her home. Reports her balance is balance off. Reports using cane on the right side and always uses her husband on the left side. Denies recent falls but says she has had some in the past.  Vision- reports she has cloudy/foggy vision in bilateral eyes. Reports this is chronic and that she uses prescribed eye drops to help with this. Reports using the back of hands or her cane as a second set of eyes when she cannot see well. Reports last eye exam was February 2021. Reports having a history of bilateral cataracts and glaucoma.   Dressing/bathing- reports she does not get in the tub for bathing because in the past she fell in the tub. Reports she does have a shower chair but that she does not use it because it has a weight limit of 250 pounds and she exceeds the limit. Reports she bathes using the sink.   Doing errands and shopping- reports her husband runs all of the errands and does all of the shopping. Reports she does have a manual wheelchair and that her husband uses it to push her around sometimes when doing errands or shopping. Reports that is a rare occurrence as he is unable to push her weight for extended periods of time. Reports she enjoys being a homebody.  Managing  medications- reports husband manages medications for her. Reports he sets medications out for 2 days at a time and draws the insulin twice daily.    Physical Exam  General appearance - alert, well appearing, and in no distress, oriented to person, place, and time and overweight Mental status - alert, oriented to person, place, and time, normal mood, behavior, speech, dress, motor activity, and thought processes Eyes - cornea of the right eye is opaque, slightly pale conjunctiva Ears - bilateral TM's and external ear canals normal Nose - normal and patent, no erythema, discharge or polyps Mouth - edentulous and tongue mildly dry  Neck - supple, no significant adenopathy Lymphatics - no palpable lymphadenopathy, no hepatosplenomegaly Chest - clear to auscultation, no wheezes, rales or rhonchi, symmetric air entry, no tachypnea, retractions or cyanosis Heart - normal rate, regular rhythm, normal S1, S2, no murmurs, rubs, clicks or gallops Neurological - except for no vision in the right eye, cranial nerves are grossly intact, grip 5/5 bilaterally, Tinel's sign negative, power proximally and distally in the upper extremities 5/5 bilaterally, power lower extremities 4/5 bilaterally proximal and distal, gross sensation intact in the  upper and lower extremities, patient ambulates with a cane, gait is wide-based and slow with low foot to floor clearance, Romberg positive, she sometimes has to hold onto objects in the room, she is unable to get on the exam table so she was examined in the chair Extremities - trace edema in both lower legs, 1+ edema in the ankles and feet   Advanced Directives: Does Patient Have a Medical Advance Directive?: No Would patient like information on creating a medical advance directive?: No - Patient declined   Discussed with patient what is an advanced directive.  I went over with her the living will and healthcare power of attorney. Patient given written packet also.  She  will look over it and consider executing a living will. If she does, I requested that she brings a copy of it for our records. Patient verbalized understanding.    Assessment:    This is a routine wellness examination for this patient .   Vision/Hearing screen No exam data present Last eye exam was February 2021. Whisper test is normal bilateral.   Dietary issues and exercise activities discussed:   Reports she does not eat sweets often.  Reports she enjoys hot sauce, chicken wings, and vegetables  Reporting consuming Ginger Ale very often. Reports she has a large pitcher of tea with honey or a cough drop daily. Reports she drinks plenty of water.  Reports she attends an exercise class once weekly.   Goals    . Blood Pressure < 140/90    . Exercise 3x per week (30 min per time)     Inquire about silver sneakers at your local [employer]     . HEMOGLOBIN A1C < 7.0       Reports her goal for the next year being better management of diabetes and hemoglobin A1C.    Depression Screen PHQ 2/9 Scores 02/28/2020 02/08/2020 12/24/2019 09/29/2018  PHQ - 2 Score 0 0 3 0  PHQ- 9 Score 0 _0 Fall Risk Fall Risk  02/28/2020  Falls in the past year? 1  Comment -  Number falls in past yr: 1  Injury with Fall? 0  Risk Factor Category  -  Risk for fall due to : -   Timed Get Up and Go Test was 42 seconds. Patient ambulates with a cane and gait is wide-based. Patient at increased risk of falls. Provided with rolling walker to assist with ambulation during today's visit.   Cognitive Function: MMSE - Mini Mental State Exam 02/28/2020  Orientation to time 4  Orientation to Place 5  Registration 3  Attention/ Calculation 0  Recall 3  Language- name 2 objects 1  Language- repeat 1  Language- follow 3 step command 3  Language- read & follow direction 1  Write a sentence 0  Copy design 0  Total score 21    Mini Mental State Exam score is 21 which is abnormal and indicative of cognitive  impairment or early dementia. Will recheck in about 6 months at which time if score is still abnormal may refer for neuropsychiatric evaluation.  Patient reports she tends to forget common everyday things and needs reminders sometimes remembering.   Patient Care Team: Ladell Pier, MD as PCP - General (Internal Medicine) Sanda Klein, MD as PCP - Cardiology (Cardiology) Rexene Agent, MD as Consulting Physician (Nephrology) Croitor[place], Dani Gobble, MD as Consulting Physician (Cardiology) Marylynn Pearson, MD as Ophthalmologist  Darron Doom, MD as Gynecologist  Plan:  1. Encounter for Medicare annual wellness exam: -Patient due for mammogram. Reports she may schedule soon but not very interested at the moment because of the pain and discomfort associated with this procedure. -All vaccinations are up-to-date. She has also received 2 doses of the Pfizer vaccine. -Hepatitis C screening complete.  -Mini Mental State Exam score is 21/30 which is abnormal and indicative of cognitive impairment or early dementia. Will recheck in about 6 months at which time if score is still abnormal may refer for neuropsychiatric evaluation. Reports she tends to forget common everyday things and sometimes needs reminders. -Discussed with patient what is a medical advanced directive. I went over with her living will and healthcare power of attorney.  Patient given written packet also. She will look over it and consider executing a living will.  If she does, I requested that she brings a copy of it for our records.  2. Osteoporosis screening: -DEXA scan ordered for osteoporosis screening.  - DG DXA FRACTURE ASSESSMENT; Future  3. Colon cancer screening: -Referral to Gastroenterology for colon cancer screening. Patient had positive Cologuard in 2018. - Ambulatory referral to Gastroenterology  I have personally reviewed and noted the following in the patient's chart:   . Medical and social history . Use  of alcohol, tobacco or illicit drugs  . Current medications and supplements . Functional ability and status . Nutritional status . Physical activity . Advanced directives . List of other physicians . Hospitalizations, surgeries, and ER visits in previous 12 months . Vitals . Screenings to include cognitive, depression, and falls . Referrals and appointments  In addition, I have reviewed and discussed with patient certain preventive protocols, quality metrics, and best practice recommendations. A written personalized care plan for preventive services as well as general preventive health recommendations were provided to patient.     Camillia Herter, NP 02/28/2020

## 2020-03-03 ENCOUNTER — Other Ambulatory Visit: Payer: Self-pay

## 2020-03-03 ENCOUNTER — Ambulatory Visit: Payer: Medicare Other | Attending: Internal Medicine

## 2020-03-03 VITALS — BP 164/80

## 2020-03-03 DIAGNOSIS — R2689 Other abnormalities of gait and mobility: Secondary | ICD-10-CM | POA: Insufficient documentation

## 2020-03-03 DIAGNOSIS — M6281 Muscle weakness (generalized): Secondary | ICD-10-CM | POA: Diagnosis not present

## 2020-03-03 NOTE — Therapy (Signed)
[place], [place], [address] Phone: [phone]   Fax:  [phone]  Physical Therapy Treatment/Re-Cert  Patient Details  Name: [name] MRN: [id number] Date of Birth: Aug 13, 1955 Referring Provider (PT): Dr Karle Plumber    Encounter Date: 03/03/2020   PT End of Session - 03/03/20 1143    Visit Number 4    Number of Visits 10    Date for PT Re-Evaluation 04/21/20    Authorization Type Medicare    PT Start Time 1100    PT Stop Time 1145    PT Time Calculation (min) 45 min    Equipment Utilized During Treatment --   SBA for safety   Activity Tolerance Patient limited by fatigue    Behavior During Therapy [facility] for tasks assessed/performed           Past Medical History:  Diagnosis Date  . Blind right eye   . Chronic back pain    "my whole back" (03/18/2015)  . Chronic chest pain    takes isosorbide  . Chronic diastolic heart failure ([place]) 9/13   echo 03/20/15 LV Ef of 60-65%, grade 2DD and PA peak pressure: 36 mm Hg   . CKD (chronic kidney disease), stage III   . Coronary artery disease    Dr Sallyanne Kuster, cardiac cath 02-07-2009 -- diffuse 3V CAD involving RCA, CFx, and D1,  pt not a candidate for bypass surgery or angioplasty,  medical management  . Diabetic neuropathy ([place])   . Diabetic retinopathy ([place])    bilateral proliferative  . Diastolic CHF, chronic ([place])   . Dyspnea    "always"  . Edema of both lower extremities   . Generalized weakness   . GERD (gastroesophageal reflux disease)   . Glaucoma, both eyes   . History of non-ST elevation myocardial infarction (NSTEMI)    02-25-2009;  05-15-2009;  01-08-2010;  04-04-2012 this MI was post-op surgery for abscess on 04-03-2012  . History of sepsis   . Hyperlipemia   . Hypertension   . Insulin dependent type 2 diabetes mellitus ([place])    followed by pcp  . Iron deficiency anemia due to chronic blood loss    uterine bleeding  .  Legally blind in left eye, as defined in [place]    per pt states vision "is foggy"  . Mild obstructive sleep apnea    study in epic 04-19-2012 mild osa,  recommendation's were wt. loss, dental  appliance, surgery, or cpap  . Morbid obesity with BMI of 40.0-44.9, adult ([place])   . OA (osteoarthritis)    knees  . PSVT (paroxysmal supraventricular tachycardia) ([place])   . Renal insufficiency    pt. denies  . Seasonal allergies   . Wears glasses     Past Surgical History:  Procedure Laterality Date  . BIOPSY  02/13/2018   Procedure: BIOPSY;  Surgeon: Ladene Artist, MD;  [facility];  Service: Endoscopy;;  . BREAST SURGERY Right 2019    breast biopsy,  per pt benign  . CARDIAC CATHETERIZATION  02-27-2009   dr al little   diffuse 3V CAD , involving RCA, CFx, and D1;  inferior wall abnormalities, low normal ef 50%  . CATARACT EXTRACTION Right   . CATARACT EXTRACTION W/ INTRAOCULAR LENS IMPLANT Left 10-11-2007   @[facility]   . COLONOSCOPY WITH PROPOFOL N/A 02/13/2018   Procedure: COLONOSCOPY WITH PROPOFOL;  Surgeon: Ladene Artist, MD;  [facility];  Service: Endoscopy;  Laterality: N/A;  .  DILATION AND CURETTAGE OF UTERUS  x2 last one 2000  . EYE SURGERY Bilateral 2009 to 2010   x2  left eye;  x2  right eye   . HYSTEROSCOPY WITH D & C N/A 10/11/2018   Procedure: DILATATION AND CURETTAGE /HYSTEROSCOPY;  Surgeon: [name], MD;  [facility];  Service: Gynecology;  Laterality: N/A;  . INCISION AND DRAINAGE PERIRECTAL ABSCESS  04/03/2012  . INTRAUTERINE DEVICE (IUD) INSERTION  10/11/2018   Procedure: INTRAUTERINE DEVICE (IUD) INSERTION;  Surgeon: [name], MD;  [facility];  Service: Gynecology;;  . IR US GUIDE [place] LEFT  02/13/2018  . IR VENIPUNCTURE 56YRS/OLDER BY MD  02/13/2018  . POLYPECTOMY  02/13/2018   Procedure: POLYPECTOMY;  Surgeon: Ladene Artist, MD;  Location: [facility];  Service: Endoscopy;;    Vitals:   03/03/20 1346  BP: (!) 164/80      Subjective Assessment - 03/03/20 1107    Subjective Pt reports she is feeling well today. Pt states her mobility is about the same and she is concerned about how she gets around and about falling. Pt denies recent falls. Pt staes she has not taken her BP medication today.    Pertinent History carpel tunnel, Knee OA,    How long can you stand comfortably? can stand at the counter and do dishes.    How long can you walk comfortably? can not walk community distances    Diagnostic tests X:ray L/R: advanced knee degeneration    Patient Stated Goals to have increased stability/ decrease her risk of falls.    Currently in Pain? No/denies              [facility] PT Assessment - 03/03/20 0001      Transfers   Transfers Sit to Stand;Stand to Sit    Sit to Stand 6: Modified independent (Device/Increase time)      Ambulation/Gait   Gait velocity TUG c SPC= 39.5 sec    Gait Comments Ambulation c SPC R 80 ft x 2; Advised pt to scan R visual  field for safety and to use a rollator for ambulation outside the home.      Berg Balance Test   Sit to Stand Able to stand  independently using hands    Standing Unsupported Able to stand 2 minutes with supervision    Sitting with Back Unsupported but Feet Supported on Floor or Stool Able to sit safely and securely 2 minutes    Stand to Sit Uses backs of legs against chair to control descent    Transfers Able to transfer safely, definite need of hands    Standing Unsupported with Eyes Closed Needs help to keep from falling    Standing Unsupported with Feet Together Needs help to attain position and unable to hold for 15 seconds    From Standing, Reach Forward with Outstretched Arm Loses balance while trying/requires external support    From Standing Position, Pick up Object from Floor Unable to try/needs assist to keep balance    From Standing Position, Turn to Look Behind Over each Shoulder Needs supervision when turning    Turn 360 Degrees Needs close  supervision or verbal cueing    Standing Unsupported, Alternately Place Feet on Step/Stool Needs assistance to keep from falling or unable to try    Standing Unsupported, One Foot in Front Loses balance while stepping or standing    Standing on One Leg Unable to try or needs assist to prevent  fall    Total Score 17      Functional Gait  Assessment   Gait assessed  Yes                         [facility] Adult PT Treatment/Exercise - 03/03/20 0001      Exercises   Exercises Knee/Hip      Knee/Hip Exercises: Seated   Long Arc Quad Strengthening;Right;Left;10 reps    Heel Slides Strengthening;Right;Left;10 reps    Heel Slides Limitations heel-toe raises    Ball Squeeze x20 with 2 sec hold    Clamshell with TheraBand Yellow   x20   Marching Strengthening;Right;Left;10 reps    Sit to Sand 10 reps;with UE support                  PT Education - 03/03/20 1142    Education Details HEP: For LE strengthneing. Pt was encouraged to complete her HEP 2x daily to affect a change in her functional mobility and safety.Written HEP provided to pt. Recommended pt taking her BP medication as prescribed.    Person(s) Educated Patient    Methods Explanation;Demonstration;Tactile cues;Verbal cues;Handout    Comprehension Verbalized understanding;Returned demonstration;Verbal cues required;Tactile cues required;Need further instruction            PT Short Term Goals - 03/03/20 1355      PT SHORT TERM GOAL #1   Status Revised    Target Date 03/24/20      PT SHORT TERM GOAL #2   Status Revised    Target Date 03/24/20      PT SHORT TERM GOAL #3   Title Patient will ambualte 23' with single point cane without increased low back pain. Achieved    Status Achieved             PT Long Term Goals - 03/03/20 1357      PT LONG TERM GOAL #1   Time 6    Period Weeks    Status Revised    Target Date 03/24/20      PT LONG TERM GOAL #2   Title Patient will report no falls for  3 weeks    Time 6    Period Weeks    Status Revised    Target Date 04/14/20      PT LONG TERM GOAL #3   Title Patient will bend to pick object off the floor without falling    Time 6    Period Weeks    Status Revised    Target Date 04/14/20      PT LONG TERM GOAL #4   Title Improved Berg to 26/56 for improved balance and decreased fall risk    Baseline 17/56    Time 6    Period Weeks    Status New    Target Date 04/14/20      PT LONG TERM GOAL #5   Title Improved TUG to 33 sec or less c a SPC for improved quality of gait and balance    Baseline 39.5 sec c SPC    Time 6    Period Weeks    Status New    Target Date 04/14/20                 Plan - 03/03/20 1348    Clinical Impression Statement Pt returns to PT after over 1 month. Berg balance assess. revealed significant balance issues have not improved with pt scoring 17/56.  Pt's TUG time was slow at 39.5 sec c a SPC. Pt reports she has not been completing her HEP. Pt completed ther e with review of HEP. Pt was enciuraged to complete 2x daily. pt was encouraged to sue a rollator for balance assist c ambualtion outside the home. Pt voiced understanding of these instructions. With limited completion of PT to date, recommend continuation of PT 1w6 per pt's availability for LE strengthening and balance activities to improve her functional mobility and safety.    Personal Factors and Comorbidities Comorbidity 1;Comorbidity 2;Comorbidity 3+    Comorbidities bilateral Knee OA, Low back pain, chronic chest pain with activity (uses nitro); CHF, DMII,    Examination-Activity Limitations Caring for Others;Reach Overhead;Stand;Stairs;Squat;Sit;Lift    Examination-Participation Restrictions Church;Shop;Meal Prep;Community Activity    Stability/Clinical Decision Making Unstable/Unpredictable    Clinical Decision Making High    Rehab Potential Fair    PT Frequency 1x / week    PT Duration 6 weeks    PT Treatment/Interventions  ADLs/Self Care Home Management;Cryotherapy;Aquatic Therapy;Electrical Stimulation;Iontophoresis 4mg /ml Dexamethasone;Traction;Ultrasound;Gait training;Stair training;Therapeutic activities;Functional mobility training;Therapeutic exercise;Balance training;Neuromuscular re-education;Patient/family education;Manual techniques;Passive range of motion;Taping    PT Next Visit Plan NuStep, progress seated strengthening and progress to standing as able, sit<>stand, standing balance at counter, incorporate UE exercises    PT Home Exercise Plan [id number]: LAQ, seated marching, seated clamshell with yellow, seated heel-toe raises, seated pillow squeeze, sit<>stand with UE support    Consulted and Agree with Plan of Care Patient           Patient will benefit from skilled therapeutic intervention in order to improve the following deficits and impairments:  Abnormal gait, Decreased strength, Decreased balance, Decreased activity tolerance, Difficulty walking  Visit Diagnosis: Other abnormalities of gait and mobility - Plan: PT plan of care cert/re-cert  Muscle weakness (generalized) - Plan: PT plan of care cert/re-cert     Problem List Patient Active Problem List   Diagnosis Date Noted  . Acute respiratory failure with hypoxia ([place]) 01/24/2020  . Chest pain 01/23/2020  . Acute exacerbation of CHF (congestive heart failure) ([place]) 01/23/2020  . Functional urinary incontinence 09/07/2019  . Urge incontinence of urine 09/07/2019  . Legally blind in right eye, as defined in [place] 08/11/2018  . Bilateral carpal tunnel syndrome 04/13/2018  . Adenomatous polyp of colon 04/13/2018  . Fibrocystic breast changes, right 12/12/2017  . Drug-induced constipation 09/15/2017  . Primary osteoarthritis of both knees 04/05/2017  . Chronic bilateral low back pain 03/07/2017  . Chronic pain of right knee 03/07/2017  . Vitamin B12 deficiency 12/09/2016  . Incidental lung nodule, > 56mm and < 34mm 09/14/2016  . Vitreous  hemorrhage of right eye ([place])   . Diabetic gastroparesis associated with type 2 diabetes mellitus ([place]) 09/12/2015  . Controlled type 2 diabetes mellitus with complication, with long-term current use of insulin ([place]) 09/12/2015  . Chronic renal insufficiency, stage III (moderate) 03/19/2015  . Prolonged Q-T interval on ECG 08/10/2013  . Dyslipidemia 04/02/2013  . Acute on chronic diastolic heart failure ([place]) 04/07/2012  . Presumed OSA (obstructive sleep apnea) 04/06/2012  . NSTEMI  post-op 04/04/12-medical Rx 04/04/2012  . Severe obesity (BMI >= 40) ([place]) 01/17/2012  . GERD 08/21/2009  . Complex endometrial hyperplasia with atypia 03/20/2009  . PERIPHERAL EDEMA 03/20/2009  . Coronary atherosclerosis-not a surgical or PCI candidate 2010 02/27/2009  . Proliferative diabetic retinopathy ([place]) 10/07/2007  . DETACHED RETINA, BILATERAL, HX OF 09/07/2007  . History of chronic Iron defeicency anemia with acute post  op anemia, transfused after debridment 04/04/12 04/17/2007  . Essential hypertension 04/17/2007    Gar Ponto MS, PT 03/03/20 2:05 PM  [facility] [address] [place], [place], [address] Phone: [phone]   Fax:  [phone]  Name: [name] MRN: [id number] Date of Birth: [birth year]

## 2020-03-04 DIAGNOSIS — H401133 Primary open-angle glaucoma, bilateral, severe stage: Secondary | ICD-10-CM | POA: Diagnosis not present

## 2020-03-08 ENCOUNTER — Other Ambulatory Visit: Payer: Self-pay | Admitting: Cardiovascular Disease

## 2020-03-11 ENCOUNTER — Other Ambulatory Visit: Payer: Self-pay

## 2020-03-11 ENCOUNTER — Ambulatory Visit: Payer: Medicare Other

## 2020-03-11 DIAGNOSIS — R2689 Other abnormalities of gait and mobility: Secondary | ICD-10-CM

## 2020-03-11 DIAGNOSIS — M6281 Muscle weakness (generalized): Secondary | ICD-10-CM

## 2020-03-11 NOTE — Therapy (Signed)
[place], [place], [address] Phone: [phone]   Fax:  [phone]  Physical Therapy Treatment  Patient Details  Name: [name] MRN: [id number] Date of Birth: 1955/11/17 Referring Provider (PT): Dr Karle Plumber    Encounter Date: 03/11/2020   PT End of Session - 03/11/20 1340    Visit Number 5    Number of Visits 10    Date for PT Re-Evaluation 04/21/20    Authorization Type Medicare    PT Start Time 0932   Pt was 17 mins late for appt   PT Stop Time 1004    PT Time Calculation (min) 32 min    Activity Tolerance Patient limited by fatigue    Behavior During Therapy [facility] for tasks assessed/performed           Past Medical History:  Diagnosis Date  . Blind right eye   . Chronic back pain    "my whole back" (03/18/2015)  . Chronic chest pain    takes isosorbide  . Chronic diastolic heart failure ([place]) 9/13   echo 03/20/15 LV Ef of 60-65%, grade 2DD and PA peak pressure: 36 mm Hg   . CKD (chronic kidney disease), stage III   . Coronary artery disease    Dr Sallyanne Kuster, cardiac cath 02-07-2009 -- diffuse 3V CAD involving RCA, CFx, and D1,  pt not a candidate for bypass surgery or angioplasty,  medical management  . Diabetic neuropathy ([place])   . Diabetic retinopathy ([place])    bilateral proliferative  . Diastolic CHF, chronic ([place])   . Dyspnea    "always"  . Edema of both lower extremities   . Generalized weakness   . GERD (gastroesophageal reflux disease)   . Glaucoma, both eyes   . History of non-ST elevation myocardial infarction (NSTEMI)    02-25-2009;  05-15-2009;  01-08-2010;  04-04-2012 this MI was post-op surgery for abscess on 04-03-2012  . History of sepsis   . Hyperlipemia   . Hypertension   . Insulin dependent type 2 diabetes mellitus ([place])    followed by pcp  . Iron deficiency anemia due to chronic blood loss    uterine bleeding  . Legally blind in left eye, as defined in  [place]    per pt states vision "is foggy"  . Mild obstructive sleep apnea    study in epic 04-19-2012 mild osa,  recommendation's were wt. loss, dental  appliance, surgery, or cpap  . Morbid obesity with BMI of 40.0-44.9, adult ([place])   . OA (osteoarthritis)    knees  . PSVT (paroxysmal supraventricular tachycardia) ([place])   . Renal insufficiency    pt. denies  . Seasonal allergies   . Wears glasses     Past Surgical History:  Procedure Laterality Date  . BIOPSY  02/13/2018   Procedure: BIOPSY;  Surgeon: Ladene Artist, MD;  [facility];  Service: Endoscopy;;  . BREAST SURGERY Right 2019    breast biopsy,  per pt benign  . CARDIAC CATHETERIZATION  02-27-2009   dr al little   diffuse 3V CAD , involving RCA, CFx, and D1;  inferior wall abnormalities, low normal ef 50%  . CATARACT EXTRACTION Right   . CATARACT EXTRACTION W/ INTRAOCULAR LENS IMPLANT Left 10-11-2007   @[facility]   . COLONOSCOPY WITH PROPOFOL N/A 02/13/2018   Procedure: COLONOSCOPY WITH PROPOFOL;  Surgeon: Ladene Artist, MD;  [facility];  Service: Endoscopy;  Laterality: N/A;  . DILATION AND  CURETTAGE OF UTERUS  x2 last one 2000  . EYE SURGERY Bilateral 2009 to 2010   x2  left eye;  x2  right eye   . HYSTEROSCOPY WITH D & C N/A 10/11/2018   Procedure: DILATATION AND CURETTAGE /HYSTEROSCOPY;  Surgeon: [name], MD;  [facility];  Service: Gynecology;  Laterality: N/A;  . INCISION AND DRAINAGE PERIRECTAL ABSCESS  04/03/2012  . INTRAUTERINE DEVICE (IUD) INSERTION  10/11/2018   Procedure: INTRAUTERINE DEVICE (IUD) INSERTION;  Surgeon: [name], MD;  [facility];  Service: Gynecology;;  . IR US GUIDE [place] LEFT  02/13/2018  . IR VENIPUNCTURE 70YRS/OLDER BY MD  02/13/2018  . POLYPECTOMY  02/13/2018   Procedure: POLYPECTOMY;  Surgeon: Ladene Artist, MD;  [facility];  Service: Endoscopy;;    There were no vitals filed for this visit.   Subjective Assessment - 03/11/20 0957      Subjective pt reports she has done her exs 2x in the past 8 days. Pt denies pain.    Currently in Pain? No/denies                             [facility] Adult PT Treatment/Exercise - 03/11/20 0001      Ambulation/Gait   Assistive device Straight cane    Gait Pattern Step-through pattern    Ambulation Surface Level      Self-Care   Self-Care Other Self-Care Comments    Other Self-Care Comments  See education      Therapeutic Activites    Therapeutic Activities Other Therapeutic Activities    Other Therapeutic Activities Balance activities      Exercises   Exercises Knee/Hip      Knee/Hip Exercises: Seated   Long Arc Quad Strengthening;Right;Left;10 reps    Heel Slides Strengthening;Right;Left;10 reps    Heel Slides Limitations heel-toe raises    Ball Squeeze x20 with 2 sec hold    Clamshell with TheraBand Yellow   x20   Marching Strengthening;Right;Left;10 reps    Sit to Sand 10 reps;with UE support               Balance Exercises - 03/11/20 0001      Balance Exercises: Standing   Standing Eyes Closed 3 reps;10 secs   assist as needed   Tandem Stance 2 reps;Eyes open;Upper extremity support 2   Partial tandem   SLS Eyes open;2 reps;Upper extremity support 2             PT Education - 03/11/20 1253    Education Details PT was encouraged to complete her HEP on a daily basis and this frquency was needed if she wanted to get stronger, improve her balance, and get around with greater ease    Person(s) Educated Patient    Methods Explanation    Comprehension Verbalized understanding            PT Short Term Goals - 03/03/20 1355      PT SHORT TERM GOAL #1   Status Revised    Target Date 03/24/20      PT SHORT TERM GOAL #2   Status Revised    Target Date 03/24/20      PT SHORT TERM GOAL #3   Title Patient will ambualte 57' with single point cane without increased low back pain. Achieved    Status Achieved             PT  Long Term  Goals - 03/03/20 1357      PT LONG TERM GOAL #1   Time 6    Period Weeks    Status Revised    Target Date 03/24/20      PT LONG TERM GOAL #2   Title Patient will report no falls for 3 weeks    Time 6    Period Weeks    Status Revised    Target Date 04/14/20      PT LONG TERM GOAL #3   Title Patient will bend to pick object off the floor without falling    Time 6    Period Weeks    Status Revised    Target Date 04/14/20      PT LONG TERM GOAL #4   Title Improved Berg to 26/56 for improved balance and decreased fall risk    Baseline 17/56    Time 6    Period Weeks    Status New    Target Date 04/14/20      PT LONG TERM GOAL #5   Title Improved TUG to 33 sec or less c a SPC for improved quality of gait and balance    Baseline 39.5 sec c SPC    Time 6    Period Weeks    Status New    Target Date 04/14/20                 Plan - 03/11/20 1255    Clinical Impression Statement Pt arrived 17 mins late for her appt. PT focused on LE strengthening and balance activities. Since the last PT session, the pt has completed her HEP only 2x over the last 8 days. pt was advised of the need to complete her exs daily. Pt will benefit from PT to increase strength and balance for improved quality and safety c functional mobility.    Personal Factors and Comorbidities Comorbidity 1;Comorbidity 2;Comorbidity 3+    Comorbidities bilateral Knee OA, Low back pain, chronic chest pain with activity (uses nitro); CHF, DMII,    Examination-Activity Limitations Caring for Others;Reach Overhead;Stand;Stairs;Squat;Sit;Lift    Examination-Participation Restrictions Church;Shop;Meal Prep;Community Activity    Stability/Clinical Decision Making Unstable/Unpredictable    Clinical Decision Making High    Rehab Potential Fair    PT Frequency 1x / week    PT Duration 6 weeks    PT Treatment/Interventions ADLs/Self Care Home Management;Cryotherapy;Aquatic Therapy;Electrical Stimulation;Iontophoresis  4mg /ml Dexamethasone;Traction;Ultrasound;Gait training;Stair training;Therapeutic activities;Functional mobility training;Therapeutic exercise;Balance training;Neuromuscular re-education;Patient/family education;Manual techniques;Passive range of motion;Taping    PT Next Visit Plan NuStep, progress seated strengthening and progress to standing as able, sit<>stand, standing balance at counter, incorporate UE exercises    PT Home Exercise Plan [id number]: LAQ, seated marching, seated clamshell with yellow, seated heel-toe raises, seated pillow squeeze, sit<>stand with UE support    Consulted and Agree with Plan of Care Patient           Patient will benefit from skilled therapeutic intervention in order to improve the following deficits and impairments:  Abnormal gait, Decreased strength, Decreased balance, Decreased activity tolerance, Difficulty walking  Visit Diagnosis: Other abnormalities of gait and mobility  Muscle weakness (generalized)     Problem List Patient Active Problem List   Diagnosis Date Noted  . Acute respiratory failure with hypoxia ([place]) 01/24/2020  . Chest pain 01/23/2020  . Acute exacerbation of CHF (congestive heart failure) ([place]) 01/23/2020  . Functional urinary incontinence 09/07/2019  . Urge incontinence of urine 09/07/2019  . Legally blind  in right eye, as defined in [place] 08/11/2018  . Bilateral carpal tunnel syndrome 04/13/2018  . Adenomatous polyp of colon 04/13/2018  . Fibrocystic breast changes, right 12/12/2017  . Drug-induced constipation 09/15/2017  . Primary osteoarthritis of both knees 04/05/2017  . Chronic bilateral low back pain 03/07/2017  . Chronic pain of right knee 03/07/2017  . Vitamin B12 deficiency 12/09/2016  . Incidental lung nodule, > 39mm and < 71mm 09/14/2016  . Vitreous hemorrhage of right eye ([place])   . Diabetic gastroparesis associated with type 2 diabetes mellitus ([place]) 09/12/2015  . Controlled type 2 diabetes mellitus with  complication, with long-term current use of insulin ([place]) 09/12/2015  . Chronic renal insufficiency, stage III (moderate) 03/19/2015  . Prolonged Q-T interval on ECG 08/10/2013  . Dyslipidemia 04/02/2013  . Acute on chronic diastolic heart failure ([place]) 04/07/2012  . Presumed OSA (obstructive sleep apnea) 04/06/2012  . NSTEMI  post-op 04/04/12-medical Rx 04/04/2012  . Severe obesity (BMI >= 40) ([place]) 01/17/2012  . GERD 08/21/2009  . Complex endometrial hyperplasia with atypia 03/20/2009  . PERIPHERAL EDEMA 03/20/2009  . Coronary atherosclerosis-not a surgical or PCI candidate 2010 02/27/2009  . Proliferative diabetic retinopathy ([place]) 10/07/2007  . DETACHED RETINA, BILATERAL, HX OF 09/07/2007  . History of chronic Iron defeicency anemia with acute post op anemia, transfused after debridment 04/04/12 04/17/2007  . Essential hypertension 04/17/2007    Gar Ponto MS, PT 03/11/20 1:45 PM  [facility] [address] [place], [place], [address] Phone: [phone]   Fax:  [phone]  Name: [name] MRN: [id number] Date of Birth: 05-18-1956

## 2020-03-13 ENCOUNTER — Ambulatory Visit: Payer: Medicare Other | Admitting: Cardiovascular Disease

## 2020-03-20 ENCOUNTER — Encounter: Payer: Self-pay | Admitting: Physical Therapy

## 2020-03-20 ENCOUNTER — Other Ambulatory Visit: Payer: Self-pay

## 2020-03-20 ENCOUNTER — Ambulatory Visit: Payer: Medicare Other | Admitting: Physical Therapy

## 2020-03-20 DIAGNOSIS — M6281 Muscle weakness (generalized): Secondary | ICD-10-CM

## 2020-03-20 DIAGNOSIS — R2689 Other abnormalities of gait and mobility: Secondary | ICD-10-CM

## 2020-03-20 NOTE — Patient Instructions (Signed)
Access Code: [email or web address] URL: [email or web address] Date: 03/20/2020 Prepared by: Hilda Blades  Exercises Seated Long Arc Quad - 3 x daily - 7 x weekly - 20 reps Seated Hip Abduction with Resistance - 3 x daily - 7 x weekly - 20 reps Seated Heel Toe Raises - 3 x daily - 7 x weekly - 20 reps Seated Hip Adduction Isometrics with Ball - 3 x daily - 7 x weekly - 20 reps - 2 seconds hold Seated March - 3 x daily - 7 x weekly - 20 reps Sit to Stand with Armchair - 5-6 x daily - 7 x weekly - 5 reps

## 2020-03-20 NOTE — Therapy (Signed)
[place], [place], [address] Phone: [phone]   Fax:  [phone]  Physical Therapy Treatment  Patient Details  Name: [name] MRN: [id number] Date of Birth: May 18, 1956 Referring Provider (PT): Dr. Karle Plumber    Encounter Date: 03/20/2020   PT End of Session - 03/20/20 1128    Visit Number 6    Number of Visits 10    Date for PT Re-Evaluation 04/21/20    Authorization Type Medicare    PT Start Time 1130    PT Stop Time 1210    PT Time Calculation (min) 40 min    Activity Tolerance Patient limited by fatigue    Behavior During Therapy [facility] for tasks assessed/performed           Past Medical History:  Diagnosis Date  . Blind right eye   . Chronic back pain    "my whole back" (03/18/2015)  . Chronic chest pain    takes isosorbide  . Chronic diastolic heart failure ([place]) 9/13   echo 03/20/15 LV Ef of 60-65%, grade 2DD and PA peak pressure: 36 mm Hg   . CKD (chronic kidney disease), stage III   . Coronary artery disease    Dr Sallyanne Kuster, cardiac cath 02-07-2009 -- diffuse 3V CAD involving RCA, CFx, and D1,  pt not a candidate for bypass surgery or angioplasty,  medical management  . Diabetic neuropathy ([place])   . Diabetic retinopathy ([place])    bilateral proliferative  . Diastolic CHF, chronic ([place])   . Dyspnea    "always"  . Edema of both lower extremities   . Generalized weakness   . GERD (gastroesophageal reflux disease)   . Glaucoma, both eyes   . History of non-ST elevation myocardial infarction (NSTEMI)    02-25-2009;  05-15-2009;  01-08-2010;  04-04-2012 this MI was post-op surgery for abscess on 04-03-2012  . History of sepsis   . Hyperlipemia   . Hypertension   . Insulin dependent type 2 diabetes mellitus ([place])    followed by pcp  . Iron deficiency anemia due to chronic blood loss    uterine bleeding  . Legally blind in left eye, as defined in [place]    per pt states vision  "is foggy"  . Mild obstructive sleep apnea    study in epic 04-19-2012 mild osa,  recommendation's were wt. loss, dental  appliance, surgery, or cpap  . Morbid obesity with BMI of 40.0-44.9, adult ([place])   . OA (osteoarthritis)    knees  . PSVT (paroxysmal supraventricular tachycardia) ([place])   . Renal insufficiency    pt. denies  . Seasonal allergies   . Wears glasses     Past Surgical History:  Procedure Laterality Date  . BIOPSY  02/13/2018   Procedure: BIOPSY;  Surgeon: Ladene Artist, MD;  [facility];  Service: Endoscopy;;  . BREAST SURGERY Right 2019    breast biopsy,  per pt benign  . CARDIAC CATHETERIZATION  02-27-2009   dr al little   diffuse 3V CAD , involving RCA, CFx, and D1;  inferior wall abnormalities, low normal ef 50%  . CATARACT EXTRACTION Right   . CATARACT EXTRACTION W/ INTRAOCULAR LENS IMPLANT Left 10-11-2007   @[facility]   . COLONOSCOPY WITH PROPOFOL N/A 02/13/2018   Procedure: COLONOSCOPY WITH PROPOFOL;  Surgeon: Ladene Artist, MD;  [facility];  Service: Endoscopy;  Laterality: N/A;  . DILATION AND CURETTAGE OF UTERUS  x2 last one 2000  .  EYE SURGERY Bilateral 2009 to 2010   x2  left eye;  x2  right eye   . HYSTEROSCOPY WITH D & C N/A 10/11/2018   Procedure: DILATATION AND CURETTAGE /HYSTEROSCOPY;  Surgeon: [name], MD;  [facility]: [facility];  Service: Gynecology;  Laterality: N/A;  . INCISION AND DRAINAGE PERIRECTAL ABSCESS  04/03/2012  . INTRAUTERINE DEVICE (IUD) INSERTION  10/11/2018   Procedure: INTRAUTERINE DEVICE (IUD) INSERTION;  Surgeon: [name], MD;  [facility]: [facility];  Service: Gynecology;;  . IR US GUIDE [place] LEFT  02/13/2018  . IR VENIPUNCTURE 84YRS/OLDER BY MD  02/13/2018  . POLYPECTOMY  02/13/2018   Procedure: POLYPECTOMY;  Surgeon: Ladene Artist, MD;  [facility];  Service: Endoscopy;;    There were no vitals filed for this visit.   Subjective Assessment - 03/20/20 1128    Patient Stated Goals to  have increased stability/ decrease her risk of falls.    Currently in Pain? No/denies              [facility] PT Assessment - 03/20/20 0001      Assessment   Medical Diagnosis Frequent falls     Referring Provider (PT) Dr. Karle Plumber       Ambulation/Gait   Ambulation/Gait Yes    Assistive device Straight cane    Gait Pattern Step-through pattern    Gait Comments SPC on right which is too high but patient resistent to lowering, wide BOS, mildly unsteady gait, decreased gait speed, decerased trunk rotation and arm swing                         [facility] Adult PT Treatment/Exercise - 03/20/20 0001      Neuro Re-ed    Neuro Re-ed Details  Romberg stance with EC 3 x 20 sec, Modified 1/2 tandem with EO 3 x 20 sec each      Exercises   Exercises Knee/Hip      Knee/Hip Exercises: Aerobic   Nustep L4 x 6 min (UE and LE)      Knee/Hip Exercises: Seated   Long Arc Quad 2 sets;20 reps    Long Arc Quad Weight 1 lbs.    Ball Squeeze 2x20 with 2 sec hold    Clamshell with TheraBand Yellow   2x20   Marching 2 sets;10 reps    Marching Weights 1 lbs.    Hamstring Curl 2 sets;20 reps    Hamstring Limitations yellow    Sit to Sand 3 sets;5 reps                  PT Education - 03/20/20 1128    Education Details HEP consistency    Person(s) Educated Patient    Methods Explanation;Demonstration;Verbal cues;Handout    Comprehension Verbalized understanding;Returned demonstration;Verbal cues required;Need further instruction            PT Short Term Goals - 03/03/20 1355      PT SHORT TERM GOAL #1   Status Revised    Target Date 03/24/20      PT SHORT TERM GOAL #2   Status Revised    Target Date 03/24/20      PT SHORT TERM GOAL #3   Title Patient will ambualte 105' with single point cane without increased low back pain. Achieved    Status Achieved             PT Long Term Goals - 03/03/20 1357  PT LONG TERM GOAL #1   Time 6    Period Weeks      Status Revised    Target Date 03/24/20      PT LONG TERM GOAL #2   Title Patient will report no falls for 3 weeks    Time 6    Period Weeks    Status Revised    Target Date 04/14/20      PT LONG TERM GOAL #3   Title Patient will bend to pick object off the floor without falling    Time 6    Period Weeks    Status Revised    Target Date 04/14/20      PT LONG TERM GOAL #4   Title Improved Berg to 26/56 for improved balance and decreased fall risk    Baseline 17/56    Time 6    Period Weeks    Status New    Target Date 04/14/20      PT LONG TERM GOAL #5   Title Improved TUG to 33 sec or less c a SPC for improved quality of gait and balance    Baseline 39.5 sec c SPC    Time 6    Period Weeks    Status New    Target Date 04/14/20                 Plan - 03/20/20 1129    Clinical Impression Statement Patient tolerated therapy well with no adverse effects. Continued working on strengthening and balance this visit with good tolerance. She is progressing well with her strengthening exercises and seems to be improving with her balance. She reported she lost her sheets of exercises at home so these were re-printed out for her and reviewed, and she was encouraged to be more consistent with those at home. Patient will benefit from continued skilled PT to increase strength and balance for improved quality and safety with functional mobility.    PT Treatment/Interventions ADLs/Self Care Home Management;Cryotherapy;Aquatic Therapy;Electrical Stimulation;Iontophoresis 4mg /ml Dexamethasone;Traction;Ultrasound;Gait training;Stair training;Therapeutic activities;Functional mobility training;Therapeutic exercise;Balance training;Neuromuscular re-education;Patient/family education;Manual techniques;Passive range of motion;Taping    PT Next Visit Plan NuStep, progress seated strengthening and progress to standing as able, sit<>stand, standing balance at counter, incorporate UE exercises     PT Home Exercise Plan [id number]: LAQ, seated marching, seated clamshell with yellow, seated heel-toe raises, seated pillow squeeze, sit<>stand with UE support    Consulted and Agree with Plan of Care Patient           Patient will benefit from skilled therapeutic intervention in order to improve the following deficits and impairments:  Abnormal gait, Decreased strength, Decreased balance, Decreased activity tolerance, Difficulty walking  Visit Diagnosis: Other abnormalities of gait and mobility  Muscle weakness (generalized)     Problem List Patient Active Problem List   Diagnosis Date Noted  . Acute respiratory failure with hypoxia ([place]) 01/24/2020  . Chest pain 01/23/2020  . Acute exacerbation of CHF (congestive heart failure) ([place]) 01/23/2020  . Functional urinary incontinence 09/07/2019  . Urge incontinence of urine 09/07/2019  . Legally blind in right eye, as defined in [place] 08/11/2018  . Bilateral carpal tunnel syndrome 04/13/2018  . Adenomatous polyp of colon 04/13/2018  . Fibrocystic breast changes, right 12/12/2017  . Drug-induced constipation 09/15/2017  . Primary osteoarthritis of both knees 04/05/2017  . Chronic bilateral low back pain 03/07/2017  . Chronic pain of right knee 03/07/2017  . Vitamin B12 deficiency 12/09/2016  . Incidental lung nodule, >  5mm and < 46mm 09/14/2016  . Vitreous hemorrhage of right eye ([place])   . Diabetic gastroparesis associated with type 2 diabetes mellitus ([place]) 09/12/2015  . Controlled type 2 diabetes mellitus with complication, with long-term current use of insulin ([place]) 09/12/2015  . Chronic renal insufficiency, stage III (moderate) 03/19/2015  . Prolonged Q-T interval on ECG 08/10/2013  . Dyslipidemia 04/02/2013  . Acute on chronic diastolic heart failure ([place]) 04/07/2012  . Presumed OSA (obstructive sleep apnea) 04/06/2012  . NSTEMI  post-op 04/04/12-medical Rx 04/04/2012  . Severe obesity (BMI >= 40) ([place]) 01/17/2012  . GERD  08/21/2009  . Complex endometrial hyperplasia with atypia 03/20/2009  . PERIPHERAL EDEMA 03/20/2009  . Coronary atherosclerosis-not a surgical or PCI candidate 2010 02/27/2009  . Proliferative diabetic retinopathy ([place]) 10/07/2007  . DETACHED RETINA, BILATERAL, HX OF 09/07/2007  . History of chronic Iron defeicency anemia with acute post op anemia, transfused after debridment 04/04/12 04/17/2007  . Essential hypertension 04/17/2007    Hilda Blades, PT, DPT, LAT, ATC 03/20/20  12:12 PM Phone: [phone] Fax: [facility] [address] [place], [place], [address] Phone: [phone]   Fax:  [phone]  Name: [name] MRN: [id number] Date of Birth: 28-Oct-1955

## 2020-03-27 ENCOUNTER — Other Ambulatory Visit: Payer: Self-pay

## 2020-03-27 ENCOUNTER — Ambulatory Visit: Payer: Medicare Other | Admitting: Physical Therapy

## 2020-03-27 ENCOUNTER — Encounter: Payer: Self-pay | Admitting: Physical Therapy

## 2020-03-27 ENCOUNTER — Ambulatory Visit: Payer: Medicare Other | Admitting: Internal Medicine

## 2020-03-27 DIAGNOSIS — M6281 Muscle weakness (generalized): Secondary | ICD-10-CM | POA: Diagnosis not present

## 2020-03-27 DIAGNOSIS — R2689 Other abnormalities of gait and mobility: Secondary | ICD-10-CM

## 2020-03-27 NOTE — Therapy (Signed)
[place], [place], [address] Phone: [phone]   Fax:  [phone]  Physical Therapy Treatment  Patient Details  Name: [name] MRN: [id number] Date of Birth: March 04, 1956 Referring Provider (PT): Dr. Karle Plumber    Encounter Date: 03/27/2020   PT End of Session - 03/27/20 1004    Visit Number 7    Number of Visits 10    Date for PT Re-Evaluation 04/14/20    Authorization Type Medicare    Progress Note Due on Visit 10    PT Start Time 1000    PT Stop Time 1040    PT Time Calculation (min) 40 min    Activity Tolerance Patient limited by fatigue    Behavior During Therapy [facility] for tasks assessed/performed           Past Medical History:  Diagnosis Date  . Blind right eye   . Chronic back pain    "my whole back" (03/18/2015)  . Chronic chest pain    takes isosorbide  . Chronic diastolic heart failure ([place]) 9/13   echo 03/20/15 LV Ef of 60-65%, grade 2DD and PA peak pressure: 36 mm Hg   . CKD (chronic kidney disease), stage III   . Coronary artery disease    Dr Sallyanne Kuster, cardiac cath 02-07-2009 -- diffuse 3V CAD involving RCA, CFx, and D1,  pt not a candidate for bypass surgery or angioplasty,  medical management  . Diabetic neuropathy ([place])   . Diabetic retinopathy ([place])    bilateral proliferative  . Diastolic CHF, chronic ([place])   . Dyspnea    "always"  . Edema of both lower extremities   . Generalized weakness   . GERD (gastroesophageal reflux disease)   . Glaucoma, both eyes   . History of non-ST elevation myocardial infarction (NSTEMI)    02-25-2009;  05-15-2009;  01-08-2010;  04-04-2012 this MI was post-op surgery for abscess on 04-03-2012  . History of sepsis   . Hyperlipemia   . Hypertension   . Insulin dependent type 2 diabetes mellitus ([place])    followed by pcp  . Iron deficiency anemia due to chronic blood loss    uterine bleeding  . Legally blind in left eye, as  defined in [place]    per pt states vision "is foggy"  . Mild obstructive sleep apnea    study in epic 04-19-2012 mild osa,  recommendation's were wt. loss, dental  appliance, surgery, or cpap  . Morbid obesity with BMI of 40.0-44.9, adult ([place])   . OA (osteoarthritis)    knees  . PSVT (paroxysmal supraventricular tachycardia) ([place])   . Renal insufficiency    pt. denies  . Seasonal allergies   . Wears glasses     Past Surgical History:  Procedure Laterality Date  . BIOPSY  02/13/2018   Procedure: BIOPSY;  Surgeon: Ladene Artist, MD;  [facility];  Service: Endoscopy;;  . BREAST SURGERY Right 2019    breast biopsy,  per pt benign  . CARDIAC CATHETERIZATION  02-27-2009   dr al little   diffuse 3V CAD , involving RCA, CFx, and D1;  inferior wall abnormalities, low normal ef 50%  . CATARACT EXTRACTION Right   . CATARACT EXTRACTION W/ INTRAOCULAR LENS IMPLANT Left 10-11-2007   @[facility]   . COLONOSCOPY WITH PROPOFOL N/A 02/13/2018   Procedure: COLONOSCOPY WITH PROPOFOL;  Surgeon: Ladene Artist, MD;  [facility];  Service: Endoscopy;  Laterality: N/A;  . DILATION  AND CURETTAGE OF UTERUS  x2 last one 2000  . EYE SURGERY Bilateral 2009 to 2010   x2  left eye;  x2  right eye   . HYSTEROSCOPY WITH D & C N/A 10/11/2018   Procedure: DILATATION AND CURETTAGE /HYSTEROSCOPY;  Surgeon: [name], MD;  [facility];  Service: Gynecology;  Laterality: N/A;  . INCISION AND DRAINAGE PERIRECTAL ABSCESS  04/03/2012  . INTRAUTERINE DEVICE (IUD) INSERTION  10/11/2018   Procedure: INTRAUTERINE DEVICE (IUD) INSERTION;  Surgeon: [name], MD;  [facility];  Service: Gynecology;;  . IR US GUIDE [place] LEFT  02/13/2018  . IR VENIPUNCTURE 58YRS/OLDER BY MD  02/13/2018  . POLYPECTOMY  02/13/2018   Procedure: POLYPECTOMY;  Surgeon: Ladene Artist, MD;  [facility];  Service: Endoscopy;;    There were no vitals filed for this visit.   Subjective Assessment -  03/27/20 0959    Subjective Patien reports she is doing well with no new issues. She does her exercises as much as she can.    Patient Stated Goals to have increased stability/ decrease her risk of falls.    Currently in Pain? No/denies              [facility] PT Assessment - 03/27/20 0001      Strength   Right Hip Flexion 4/5    Right Hip ABduction 3+/5    Left Hip Flexion 4/5    Left Hip ABduction 3+/5                         [facility] Adult PT Treatment/Exercise - 03/27/20 0001      Neuro Re-ed    Neuro Re-ed Details  Romberg with head turns 3 x 20 sec, Modified 3/4 tandem 3 x 20 sec      Exercises   Exercises Knee/Hip      Knee/Hip Exercises: Aerobic   Nustep L5 x 8 min (UE and LE)      Knee/Hip Exercises: Standing   Heel Raises 20 reps    Hip Flexion 20 reps;Knee bent    Hip Flexion Limitations marching    Hip Abduction 20 reps    Other Standing Knee Exercises Stride weight shifts fwd/bwd x10 each      Knee/Hip Exercises: Seated   Long Arc Quad 2 sets;20 reps    Long Arc Quad Weight 2 lbs.    Clamshell with TheraBand Red   2x20   Sit to Sand 2 sets;5 reps   use of BUE on armrest to rise                 PT Education - 03/27/20 1000    Education Details HEP update and consisteny    Person(s) Educated Patient    Methods Explanation;Handout;Demonstration;Verbal cues    Comprehension Verbalized understanding;Need further instruction;Returned demonstration;Verbal cues required            PT Short Term Goals - 03/03/20 1355      PT SHORT TERM GOAL #1   Status Revised    Target Date 03/24/20      PT SHORT TERM GOAL #2   Status Revised    Target Date 03/24/20      PT SHORT TERM GOAL #3   Title Patient will ambualte 66' with single point cane without increased low back pain. Achieved    Status Achieved             PT  Long Term Goals - 03/03/20 1357      PT LONG TERM GOAL #1   Time 6    Period Weeks    Status Revised    Target  Date 03/24/20      PT LONG TERM GOAL #2   Title Patient will report no falls for 3 weeks    Time 6    Period Weeks    Status Revised    Target Date 04/14/20      PT LONG TERM GOAL #3   Title Patient will bend to pick object off the floor without falling    Time 6    Period Weeks    Status Revised    Target Date 04/14/20      PT LONG TERM GOAL #4   Title Improved Berg to 26/56 for improved balance and decreased fall risk    Baseline 17/56    Time 6    Period Weeks    Status New    Target Date 04/14/20      PT LONG TERM GOAL #5   Title Improved TUG to 33 sec or less c a SPC for improved quality of gait and balance    Baseline 39.5 sec c SPC    Time 6    Period Weeks    Status New    Target Date 04/14/20                 Plan - 03/27/20 1004    Clinical Impression Statement Patient tolerated therapy well with no adverse effects. She is progressing well with her exercises into standing position and tolerating greater challenges with her balance. She continues to exhibit unsteadiness and decreased speed with gait, and her cane is too high for her but she is resistant to lowering it. Worked on proper heel-toe weight shift and sequency with cane to improve gait and she was encouraged to improve consistency with exercises and walking progression at home. She did require frequent rest breaks especially with standing exercises. Patient will benefit from continued skilled PT to increase strength and balance for improved quality and safety with functional mobility.    PT Treatment/Interventions ADLs/Self Care Home Management;Cryotherapy;Aquatic Therapy;Electrical Stimulation;Iontophoresis 4mg /ml Dexamethasone;Traction;Ultrasound;Gait training;Stair training;Therapeutic activities;Functional mobility training;Therapeutic exercise;Balance training;Neuromuscular re-education;Patient/family education;Manual techniques;Passive range of motion;Taping    PT Next Visit Plan NuStep, progress  seated strengthening and progress to standing as able, sit<>stand, standing balance at counter, incorporate UE exercises    PT Home Exercise Plan [email or web address]: LAQ, seated clamshell with red, seated heel-toe raises, seated pillow squeeze, sit<>stand with UE support, modified tandem balance, romberg with head turns, stride weight shifts, standing hip abduction, standing alternating marching    Consulted and Agree with Plan of Care Patient           Patient will benefit from skilled therapeutic intervention in order to improve the following deficits and impairments:  Abnormal gait, Decreased strength, Decreased balance, Decreased activity tolerance, Difficulty walking  Visit Diagnosis: Other abnormalities of gait and mobility  Muscle weakness (generalized)     Problem List Patient Active Problem List   Diagnosis Date Noted  . Acute respiratory failure with hypoxia ([place]) 01/24/2020  . Chest pain 01/23/2020  . Acute exacerbation of CHF (congestive heart failure) ([place]) 01/23/2020  . Functional urinary incontinence 09/07/2019  . Urge incontinence of urine 09/07/2019  . Legally blind in right eye, as defined in [place] 08/11/2018  . Bilateral carpal tunnel syndrome 04/13/2018  . Adenomatous polyp of colon 04/13/2018  .  Fibrocystic breast changes, right 12/12/2017  . Drug-induced constipation 09/15/2017  . Primary osteoarthritis of both knees 04/05/2017  . Chronic bilateral low back pain 03/07/2017  . Chronic pain of right knee 03/07/2017  . Vitamin B12 deficiency 12/09/2016  . Incidental lung nodule, > 54mm and < 60mm 09/14/2016  . Vitreous hemorrhage of right eye ([place])   . Diabetic gastroparesis associated with type 2 diabetes mellitus ([place]) 09/12/2015  . Controlled type 2 diabetes mellitus with complication, with long-term current use of insulin ([place]) 09/12/2015  . Chronic renal insufficiency, stage III (moderate) 03/19/2015  . Prolonged Q-T interval on ECG 08/10/2013  . Dyslipidemia  04/02/2013  . Acute on chronic diastolic heart failure ([place]) 04/07/2012  . Presumed OSA (obstructive sleep apnea) 04/06/2012  . NSTEMI  post-op 04/04/12-medical Rx 04/04/2012  . Severe obesity (BMI >= 40) ([place]) 01/17/2012  . GERD 08/21/2009  . Complex endometrial hyperplasia with atypia 03/20/2009  . PERIPHERAL EDEMA 03/20/2009  . Coronary atherosclerosis-not a surgical or PCI candidate 2010 02/27/2009  . Proliferative diabetic retinopathy ([place]) 10/07/2007  . DETACHED RETINA, BILATERAL, HX OF 09/07/2007  . History of chronic Iron defeicency anemia with acute post op anemia, transfused after debridment 04/04/12 04/17/2007  . Essential hypertension 04/17/2007    Hilda Blades, PT, DPT, LAT, ATC 03/27/20  10:48 AM Phone: [phone] Fax: [facility] [address] [place], [place], [address] Phone: [phone]   Fax:  [phone]  Name: [name] MRN: [id number] Date of Birth: April 22, 1956

## 2020-03-27 NOTE — Patient Instructions (Signed)
Access Code: [email or web address] URL: [email or web address] Date: 03/27/2020 Prepared by: Hilda Blades  Exercises Seated Long Arc Quad - 2 x daily - 7 x weekly - 20 reps Seated Hip Abduction with Resistance - 2 x daily - 7 x weekly - 20 reps Seated Hip Adduction Isometrics with Ball - 2 x daily - 7 x weekly - 20 reps - 2 seconds hold Sit to Stand with Armchair - 5-6 x daily - 7 x weekly - 5 reps Standing Hip Abduction with Counter Support - 2 x daily - 7 x weekly - 20 reps Standing March with Counter Support - 2 x daily - 7 x weekly - 20 reps Heel rises with counter support - 2 x daily - 7 x weekly - 20 reps Standing Romberg to 3/4 Tandem Stance - 2 x daily - 7 x weekly - 2 sets - 20 seconds hold Romberg Stance with Head Rotation - 2 x daily - 7 x weekly - 2 sets - 10 reps Stride Stance Weight Shift - 2 x daily - 7 x weekly - 2 sets - 10 reps

## 2020-04-03 ENCOUNTER — Ambulatory Visit: Payer: Medicare Other | Attending: Internal Medicine

## 2020-04-03 ENCOUNTER — Other Ambulatory Visit: Payer: Self-pay

## 2020-04-03 VITALS — BP 156/82

## 2020-04-03 DIAGNOSIS — R262 Difficulty in walking, not elsewhere classified: Secondary | ICD-10-CM | POA: Diagnosis not present

## 2020-04-03 DIAGNOSIS — M6281 Muscle weakness (generalized): Secondary | ICD-10-CM | POA: Diagnosis not present

## 2020-04-03 DIAGNOSIS — R2689 Other abnormalities of gait and mobility: Secondary | ICD-10-CM

## 2020-04-04 NOTE — Therapy (Signed)
[place], [place], [address] Phone: [phone]   Fax:  [phone]  Physical Therapy Treatment  Patient Details  Name: [name] MRN: [id number] Date of Birth: [birth year] Referring Provider (PT): Dr. Karle Plumber    Encounter Date: 04/03/2020   PT End of Session - 04/04/20 0759    Visit Number 8    Number of Visits 10    Date for PT Re-Evaluation 04/14/20    Authorization Type Medicare    Progress Note Due on Visit 10    PT Start Time 3810    PT Stop Time 1134    PT Time Calculation (min) 49 min    Activity Tolerance Patient limited by fatigue    Behavior During Therapy [facility] for tasks assessed/performed           Past Medical History:  Diagnosis Date  . Blind right eye   . Chronic back pain    "my whole back" (03/18/2015)  . Chronic chest pain    takes isosorbide  . Chronic diastolic heart failure ([place]) 9/13   echo 03/20/15 LV Ef of 60-65%, grade 2DD and PA peak pressure: 36 mm Hg   . CKD (chronic kidney disease), stage III   . Coronary artery disease    Dr Sallyanne Kuster, cardiac cath 02-07-2009 -- diffuse 3V CAD involving RCA, CFx, and D1,  pt not a candidate for bypass surgery or angioplasty,  medical management  . Diabetic neuropathy ([place])   . Diabetic retinopathy ([place])    bilateral proliferative  . Diastolic CHF, chronic ([place])   . Dyspnea    "always"  . Edema of both lower extremities   . Generalized weakness   . GERD (gastroesophageal reflux disease)   . Glaucoma, both eyes   . History of non-ST elevation myocardial infarction (NSTEMI)    02-25-2009;  05-15-2009;  01-08-2010;  04-04-2012 this MI was post-op surgery for abscess on 04-03-2012  . History of sepsis   . Hyperlipemia   . Hypertension   . Insulin dependent type 2 diabetes mellitus ([place])    followed by pcp  . Iron deficiency anemia due to chronic blood loss    uterine bleeding  . Legally blind in left eye, as  defined in [place]    per pt states vision "is foggy"  . Mild obstructive sleep apnea    study in epic 04-19-2012 mild osa,  recommendation's were wt. loss, dental  appliance, surgery, or cpap  . Morbid obesity with BMI of 40.0-44.9, adult ([place])   . OA (osteoarthritis)    knees  . PSVT (paroxysmal supraventricular tachycardia) ([place])   . Renal insufficiency    pt. denies  . Seasonal allergies   . Wears glasses     Past Surgical History:  Procedure Laterality Date  . BIOPSY  02/13/2018   Procedure: BIOPSY;  Surgeon: Ladene Artist, MD;  [facility];  Service: Endoscopy;;  . BREAST SURGERY Right 2019    breast biopsy,  per pt benign  . CARDIAC CATHETERIZATION  02-27-2009   dr al little   diffuse 3V CAD , involving RCA, CFx, and D1;  inferior wall abnormalities, low normal ef 50%  . CATARACT EXTRACTION Right   . CATARACT EXTRACTION W/ INTRAOCULAR LENS IMPLANT Left 10-11-2007   @[facility]   . COLONOSCOPY WITH PROPOFOL N/A 02/13/2018   Procedure: COLONOSCOPY WITH PROPOFOL;  Surgeon: Ladene Artist, MD;  [facility];  Service: Endoscopy;  Laterality: N/A;  . DILATION  AND CURETTAGE OF UTERUS  x2 last one 2000  . EYE SURGERY Bilateral 2009 to 2010   x2  left eye;  x2  right eye   . HYSTEROSCOPY WITH D & C N/A 10/11/2018   Procedure: DILATATION AND CURETTAGE /HYSTEROSCOPY;  Surgeon: [name], MD;  [facility];  Service: Gynecology;  Laterality: N/A;  . INCISION AND DRAINAGE PERIRECTAL ABSCESS  04/03/2012  . INTRAUTERINE DEVICE (IUD) INSERTION  10/11/2018   Procedure: INTRAUTERINE DEVICE (IUD) INSERTION;  Surgeon: [name], MD;  [facility];  Service: Gynecology;;  . IR US GUIDE [place] LEFT  02/13/2018  . IR VENIPUNCTURE 2YRS/OLDER BY MD  02/13/2018  . POLYPECTOMY  02/13/2018   Procedure: POLYPECTOMY;  Surgeon: Ladene Artist, MD;  [facility];  Service: Endoscopy;;    Vitals:   04/03/20 1103  BP: (!) 156/82     Subjective Assessment -  04/03/20 1103    Subjective Pt reports she feels like she is getting around better. Pt reports she is completing her exs 2 to 3x a week. Pt denies no falls.    Pertinent History carpel tunnel, Knee OA,    Patient Stated Goals to have increased stability/ decrease her risk of falls.    Pain Score 0-No pain   knee   Pain Orientation Right    Pain Descriptors / Indicators Aching    Pain Type Chronic pain    Pain Onset More than a month ago    Pain Frequency Constant    Aggravating Factors  standing, walking, weather    Pain Relieving Factors Rest    Effect of Pain on Daily Activities difficulty perfroming ADL's                             [facility] Adult PT Treatment/Exercise - 04/04/20 0001      Ambulation/Gait   Ambulation/Gait Yes    Assistive device Straight cane    Gait Pattern Step-through pattern   incomplete step through     Exercises   Exercises Knee/Hip      Knee/Hip Exercises: Aerobic   Nustep L5 x 10 min (UE and LE)      Knee/Hip Exercises: Standing   Other Standing Knee Exercises Side stepping 23ft x 4 at counter      Knee/Hip Exercises: Seated   Long Arc Quad Strengthening;Right;Left;3 sets;10 reps    Long Arc Quad Weight 2 lbs.    Marching 2 sets;10 reps    Marching Limitations RTB    Abduction/Adduction  Strengthening;Right;Left;3 sets;10 reps    Abd/Adduction Limitations RTB    Sit to Sand 2 sets;5 reps   use of BUE on armrest to rise              Balance Exercises - 04/04/20 0001      Balance Exercises: Standing   Tandem Stance Eyes open;Upper extremity support 2;3 reps   Partial tandem   SLS Eyes open;Upper extremity support 2;3 reps             PT Education - 04/04/20 0759    Education Details Pt was emcouraged to complete her HEP daily to improve strength and function.    Person(s) Educated Patient    Methods Explanation    Comprehension Verbalized understanding            PT Short Term Goals - 03/03/20 1355       PT SHORT TERM GOAL #  1   Status Revised    Target Date 03/24/20      PT SHORT TERM GOAL #2   Status Revised    Target Date 03/24/20      PT SHORT TERM GOAL #3   Title Patient will ambualte 72' with single point cane without increased low back pain. Achieved    Status Achieved             PT Long Term Goals - 03/03/20 1357      PT LONG TERM GOAL #1   Time 6    Period Weeks    Status Revised    Target Date 03/24/20      PT LONG TERM GOAL #2   Title Patient will report no falls for 3 weeks    Time 6    Period Weeks    Status Revised    Target Date 04/14/20      PT LONG TERM GOAL #3   Title Patient will bend to pick object off the floor without falling    Time 6    Period Weeks    Status Revised    Target Date 04/14/20      PT LONG TERM GOAL #4   Title Improved Berg to 26/56 for improved balance and decreased fall risk    Baseline 17/56    Time 6    Period Weeks    Status New    Target Date 04/14/20      PT LONG TERM GOAL #5   Title Improved TUG to 33 sec or less c a SPC for improved quality of gait and balance    Baseline 39.5 sec c SPC    Time 6    Period Weeks    Status New    Target Date 04/14/20                 Plan - 04/04/20 1216    Clinical Impression Statement PT focused on strengthening exs for the LEs and trunk and balance activities. Pt demonstrates decreased balance and is using a SPC for assist. Pt denies any new falls. PT continues to encourage more consistent completion of exs at home for a better functional mobility.    Personal Factors and Comorbidities Comorbidity 1;Comorbidity 2;Comorbidity 3+    Comorbidities bilateral Knee OA, Low back pain, chronic chest pain with activity (uses nitro); CHF, DMII,    Examination-Activity Limitations Caring for Others;Reach Overhead;Stand;Stairs;Squat;Sit;Lift    Examination-Participation Restrictions Church;Shop;Meal Prep;Community Activity    Stability/Clinical Decision Making  Unstable/Unpredictable    Rehab Potential Fair    PT Frequency 1x / week    PT Duration 6 weeks    PT Treatment/Interventions ADLs/Self Care Home Management;Cryotherapy;Aquatic Therapy;Electrical Stimulation;Iontophoresis 4mg /ml Dexamethasone;Traction;Ultrasound;Gait training;Stair training;Therapeutic activities;Functional mobility training;Therapeutic exercise;Balance training;Neuromuscular re-education;Patient/family education;Manual techniques;Passive range of motion;Taping    PT Next Visit Plan NuStep, progress seated strengthening and progress to standing as able, sit<>stand, standing balance at counter, incorporate UE exercises    Consulted and Agree with Plan of Care Patient           Patient will benefit from skilled therapeutic intervention in order to improve the following deficits and impairments:  Abnormal gait, Decreased strength, Decreased balance, Decreased activity tolerance, Difficulty walking  Visit Diagnosis: Other abnormalities of gait and mobility  Muscle weakness (generalized)  Difficulty in walking, not elsewhere classified     Problem List Patient Active Problem List   Diagnosis Date Noted  . Acute respiratory failure with hypoxia ([place]) 01/24/2020  .  Chest pain 01/23/2020  . Acute exacerbation of CHF (congestive heart failure) ([employer]) 01/23/2020  . Functional urinary incontinence 09/07/2019  . Urge incontinence of urine 09/07/2019  . Legally blind in right eye, as defined in [place] 08/11/2018  . Bilateral carpal tunnel syndrome 04/13/2018  . Adenomatous polyp of colon 04/13/2018  . Fibrocystic breast changes, right 12/12/2017  . Drug-induced constipation 09/15/2017  . Primary osteoarthritis of both knees 04/05/2017  . Chronic bilateral low back pain 03/07/2017  . Chronic pain of right knee 03/07/2017  . Vitamin B12 deficiency 12/09/2016  . Incidental lung nodule, > 76mm and < 6mm 09/14/2016  . Vitreous hemorrhage of right eye ([place])   . Diabetic  gastroparesis associated with type 2 diabetes mellitus ([place]) 09/12/2015  . Controlled type 2 diabetes mellitus with complication, with long-term current use of insulin ([place]) 09/12/2015  . Chronic renal insufficiency, stage III (moderate) 03/19/2015  . Prolonged Q-T interval on ECG 08/10/2013  . Dyslipidemia 04/02/2013  . Acute on chronic diastolic heart failure ([place]) 04/07/2012  . Presumed OSA (obstructive sleep apnea) 04/06/2012  . NSTEMI  post-op 04/04/12-medical Rx 04/04/2012  . Severe obesity (BMI >= 40) ([place]) 01/17/2012  . GERD 08/21/2009  . Complex endometrial hyperplasia with atypia 03/20/2009  . PERIPHERAL EDEMA 03/20/2009  . Coronary atherosclerosis-not a surgical or PCI candidate 2010 02/27/2009  . Proliferative diabetic retinopathy ([place]) 10/07/2007  . DETACHED RETINA, BILATERAL, HX OF 09/07/2007  . History of chronic Iron defeicency anemia with acute post op anemia, transfused after debridment 04/04/12 04/17/2007  . Essential hypertension 04/17/2007   Gar Ponto MS, PT 04/04/20 12:23 PM  [facility] [address] [place], [place], [address] Phone: [phone]   Fax:  [phone]  Name: [name] MRN: [id number] Date of Birth: 09/09/1955

## 2020-04-10 ENCOUNTER — Other Ambulatory Visit: Payer: Self-pay

## 2020-04-10 ENCOUNTER — Ambulatory Visit: Payer: Medicare Other

## 2020-04-10 DIAGNOSIS — R2689 Other abnormalities of gait and mobility: Secondary | ICD-10-CM | POA: Diagnosis not present

## 2020-04-10 DIAGNOSIS — R262 Difficulty in walking, not elsewhere classified: Secondary | ICD-10-CM

## 2020-04-10 DIAGNOSIS — M6281 Muscle weakness (generalized): Secondary | ICD-10-CM

## 2020-04-10 NOTE — Therapy (Signed)
[place], [place], [address] Phone: [phone]   Fax:  [phone]  Physical Therapy Treatment/Re-Cert/Progress note   Patient Details  Name: [name] MRN: [id number] Date of Birth: Aug 21, 1955 Referring Provider (PT): Dr. Karle Plumber  Progress Note Reporting Period 01/07/20 to 04/10/20  See note below for Objective Data and Assessment of Progress/Goals.   Encounter Date: 04/10/2020   PT End of Session - 04/10/20 1210    Visit Number 9    Number of Visits 10    Date for PT Re-Evaluation 04/14/20    Authorization Type Medicare    Progress Note Due on Visit 9    PT Start Time 1050    PT Stop Time 1140    PT Time Calculation (min) 50 min    Activity Tolerance Patient limited by fatigue    Behavior During Therapy [facility] for tasks assessed/performed           Past Medical History:  Diagnosis Date  . Blind right eye   . Chronic back pain    "my whole back" (03/18/2015)  . Chronic chest pain    takes isosorbide  . Chronic diastolic heart failure ([place]) 9/13   echo 03/20/15 LV Ef of 60-65%, grade 2DD and PA peak pressure: 36 mm Hg   . CKD (chronic kidney disease), stage III   . Coronary artery disease    Dr Sallyanne Kuster, cardiac cath 02-07-2009 -- diffuse 3V CAD involving RCA, CFx, and D1,  pt not a candidate for bypass surgery or angioplasty,  medical management  . Diabetic neuropathy ([place])   . Diabetic retinopathy ([place])    bilateral proliferative  . Diastolic CHF, chronic ([place])   . Dyspnea    "always"  . Edema of both lower extremities   . Generalized weakness   . GERD (gastroesophageal reflux disease)   . Glaucoma, both eyes   . History of non-ST elevation myocardial infarction (NSTEMI)    02-25-2009;  05-15-2009;  01-08-2010;  04-04-2012 this MI was post-op surgery for abscess on 04-03-2012  . History of sepsis   . Hyperlipemia   . Hypertension   . Insulin dependent type 2 diabetes  mellitus ([place])    followed by pcp  . Iron deficiency anemia due to chronic blood loss    uterine bleeding  . Legally blind in left eye, as defined in [place]    per pt states vision "is foggy"  . Mild obstructive sleep apnea    study in epic 04-19-2012 mild osa,  recommendation's were wt. loss, dental  appliance, surgery, or cpap  . Morbid obesity with BMI of 40.0-44.9, adult ([place])   . OA (osteoarthritis)    knees  . PSVT (paroxysmal supraventricular tachycardia) ([place])   . Renal insufficiency    pt. denies  . Seasonal allergies   . Wears glasses     Past Surgical History:  Procedure Laterality Date  . BIOPSY  02/13/2018   Procedure: BIOPSY;  Surgeon: Ladene Artist, MD;  [facility];  Service: Endoscopy;;  . BREAST SURGERY Right 2019    breast biopsy,  per pt benign  . CARDIAC CATHETERIZATION  02-27-2009   dr al little   diffuse 3V CAD , involving RCA, CFx, and D1;  inferior wall abnormalities, low normal ef 50%  . CATARACT EXTRACTION Right   . CATARACT EXTRACTION W/ INTRAOCULAR LENS IMPLANT Left 10-11-2007   '@[facility]'   . COLONOSCOPY WITH PROPOFOL N/A 02/13/2018   Procedure: COLONOSCOPY WITH  PROPOFOL;  Surgeon: Ladene Artist, MD;  Location[facility];  Service: Endoscopy;  Laterality: N/A;  . DILATION AND CURETTAGE OF UTERUS  x2 last one 2000  . EYE SURGERY Bilateral 2009 to 2010   x2  left eye;  x2  right eye   . HYSTEROSCOPY WITH D & C N/A 10/11/2018   Procedure: DILATATION AND CURETTAGE /HYSTEROSCOPY;  Surgeon: [name], MD;  [facility];  Service: Gynecology;  Laterality: N/A;  . INCISION AND DRAINAGE PERIRECTAL ABSCESS  04/03/2012  . INTRAUTERINE DEVICE (IUD) INSERTION  10/11/2018   Procedure: INTRAUTERINE DEVICE (IUD) INSERTION;  Surgeon: [name], MD;  [facility];  Service: Gynecology;;  . IR US GUIDE [place] LEFT  02/13/2018  . IR VENIPUNCTURE 49YRS/OLDER BY MD  02/13/2018  . POLYPECTOMY  02/13/2018   Procedure: POLYPECTOMY;  Surgeon: Ladene Artist, MD;  [facility];  Service: Endoscopy;;    There were no vitals filed for this visit.   Subjective Assessment - 04/10/20 1101    Subjective Pt reports she is doing well today. She states she is having her usual amount of low back pain. Pt states her balance is a little better.    Pertinent History carpel tunnel, Knee OA,    Diagnostic tests X:ray L/R: advanced knee degeneration    Patient Stated Goals to have increased stability/ decrease her risk of falls.    Currently in Pain? Yes    Pain Score 5     Pain Location Back    Pain Orientation Lower    Pain Descriptors / Indicators Aching    Pain Type Chronic pain    Pain Onset More than a month ago    Pain Frequency Constant    Aggravating Factors  standing, walking, weather    Pain Relieving Factors Rest    Effect of Pain on Daily Activities difficulty perfroming ADL's              [facility] PT Assessment - 04/10/20 0001      Ambulation/Gait   Gait Comments 33.9 sec c SPC      Berg Balance Test   Sit to Stand Able to stand  independently using hands    Standing Unsupported Able to stand 2 minutes with supervision    Sitting with Back Unsupported but Feet Supported on Floor or Stool Able to sit safely and securely 2 minutes    Stand to Sit Controls descent by using hands    Transfers Able to transfer safely, definite need of hands    Standing Unsupported with Eyes Closed Able to stand 10 seconds with supervision    Standing Unsupported with Feet Together Able to place feet together independently but unable to hold for 30 seconds    From Standing, Reach Forward with Outstretched Arm Can reach forward >5 cm safely (2")    From Standing Position, Pick up Object from Floor Unable to try/needs assist to keep balance    From Standing Position, Turn to Look Behind Over each Shoulder Turn sideways only but maintains balance    Turn 360 Degrees Needs close supervision or verbal cueing    Standing Unsupported,  Alternately Place Feet on Step/Stool Needs assistance to keep from falling or unable to try    Standing Unsupported, One Foot in Front Needs help to step but can hold 15 seconds    Standing on One Leg Tries to lift leg/unable to hold 3 seconds but remains standing independently  Total Score 28      Functional Gait  Assessment   Gait assessed  --                         [facility] Adult PT Treatment/Exercise - 04/10/20 0001      Ambulation/Gait   Ambulation/Gait Yes    Assistive device Straight cane    Gait Pattern Step-through pattern   incomplete step through; slow pace   Ambulation Surface Level      Exercises   Exercises Knee/Hip      Knee/Hip Exercises: Aerobic   Nustep L5 x 10 min (UE and LE)      Knee/Hip Exercises: Standing   Heel Raises 20 reps    Hip Flexion 20 reps;Knee bent    Hip Flexion Limitations marching    Hip Abduction 20 reps    Other Standing Knee Exercises Side stepping 61f x 4 at counter      Knee/Hip Exercises: Seated   Long Arc Quad Strengthening;Right;Left;3 sets;10 reps    Long Arc Quad Weight 2 lbs.                    PT Short Term Goals - 04/10/20 1236      PT SHORT TERM GOAL #1   Title Patient will increase gross bilateral LE strength to 4+/5. On-going-4/5    Status Not Met    Target Date 05/29/20      PT SHORT TERM GOAL #2   Title Patient will transfer sit to stand 3x without increased low back pain. Achieved without increase in baseline low back pain.    Status Achieved    Target Date 04/10/20      PT SHORT TERM GOAL #3   Title Patient will ambualte 765 with single point cane without increased low back pain. Achieved- Without increase in baseline low back pain    Status Achieved    Target Date 04/10/20             PT Long Term Goals - 04/10/20 1248      PT LONG TERM GOAL #1   Title Patient will ambualte 200' without increased pain in order to walk in her house. On-going-Walking 200" but low back pain  increased    Status On-going    Target Date 05/29/20      PT LONG TERM GOAL #2   Title Patient will report no falls for 3 weeks. Achieved    Target Date 04/10/20      PT LONG TERM GOAL #3   Status Achieved    Target Date 04/10/20      PT LONG TERM GOAL #4   Title Improved Berg to 26/56 for improved balance and decreased fall risk Revised-28/56. New  goal: 32/56    Baseline 17/56    Status Revised    Target Date 05/29/20      PT LONG TERM GOAL #5   Title Improved TUG to 33 sec or less c a SPC for improved quality of gait and balance. Ongoing-33.9 c SPC    Baseline 39.5 sec c SPC    Status On-going                 Plan - 04/10/20 1307    Clinical Impression Statement Pt is improving with LE strength, balance and functional mobility with pt progressing toward or meeting set goals as stated below. Pt will benefit from PT 1w6 to address these areas to  further improve pt's tolerance to activity, balance, and safety with functional mobility.    Personal Factors and Comorbidities Comorbidity 1;Comorbidity 2;Comorbidity 3+    Comorbidities bilateral Knee OA, Low back pain, chronic chest pain with activity (uses nitro); CHF, DMII,    Examination-Activity Limitations Caring for Others;Reach Overhead;Stand;Stairs;Squat;Sit;Lift    Examination-Participation Restrictions Church;Shop;Meal Prep;Community Activity    Stability/Clinical Decision Making Unstable/Unpredictable    Clinical Decision Making High    Rehab Potential Fair    PT Frequency 1x / week    PT Duration 6 weeks    PT Treatment/Interventions ADLs/Self Care Home Management;Cryotherapy;Aquatic Therapy;Electrical Stimulation;Iontophoresis 59m/ml Dexamethasone;Traction;Ultrasound;Gait training;Stair training;Therapeutic activities;Functional mobility training;Therapeutic exercise;Balance training;Neuromuscular re-education;Patient/family education;Manual techniques;Passive range of motion;Taping    PT Next Visit Plan NuStep,  progress seated strengthening and progress to standing as able, sit<>stand, standing balance at counter, incorporate UE exercises    PT Home Exercise Plan [id number]: LAQ, seated clamshell with red, seated heel-toe raises, seated pillow squeeze, sit<>stand with UE support, modified tandem balance, romberg with head turns, stride weight shifts, standing hip abduction, standing alternating marching    Consulted and Agree with Plan of Care Patient           Patient will benefit from skilled therapeutic intervention in order to improve the following deficits and impairments:  Abnormal gait, Decreased strength, Decreased balance, Decreased activity tolerance, Difficulty walking  Visit Diagnosis: Other abnormalities of gait and mobility  Muscle weakness (generalized)  Difficulty in walking, not elsewhere classified     Problem List Patient Active Problem List   Diagnosis Date Noted  . Acute respiratory failure with hypoxia ([place] 01/24/2020  . Chest pain 01/23/2020  . Acute exacerbation of CHF (congestive heart failure) ([place] 01/23/2020  . Functional urinary incontinence 09/07/2019  . Urge incontinence of urine 09/07/2019  . Legally blind in right [place], [place]  . Bilateral carpal tunnel syndrome 04/13/2018  . Adenomatous polyp of colon 04/13/2018  . Fibrocystic breast changes, right 12/12/2017  . Drug-induced constipation 09/15/2017  . Primary osteoarthritis of both knees 04/05/2017  . Chronic bilateral low back pain 03/07/2017  . Chronic pain of right knee 03/07/2017  . Vitamin B12 deficiency 12/09/2016  . Incidental lung nodule, > 364mand < 23m76m2/13/2018  . Vitreous hemorrhage of right eye ([place] . Diabetic gastroparesis associated with type 2 diabetes mellitus (HCCWest Easton2/05/2016  . Controlled type 2 diabetes mellitus with complication, with long-term current use of insulin ([place]CCSanta [place]2/05/2016  . Chronic renal insufficiency, stage III (moderate) 03/19/2015  .  Prolonged Q-T interval on ECG 08/10/2013  . Dyslipidemia 04/02/2013  . Acute on chronic diastolic heart failure (HCCNew Whiteland9/01/2012  . Presumed OSA (obstructive sleep apnea) 04/06/2012  . NSTEMI  post-op 04/04/12-medical Rx 04/04/2012  . Severe obesity (BMI >= 40) (HCCSallisaw6/17/2013  . GERD 08/21/2009  . Complex endometrial hyperplasia with atypia 03/20/2009  . PERIPHERAL EDEMA 03/20/2009  . Coronary atherosclerosis-not a surgical or PCI candidate 2010 02/27/2009  . Proliferative diabetic retinopathy (HCCIndian Mountain Lake3/01/2008  . DETACHED RETINA, BILATERAL, HX OF 09/07/2007  . History of chronic Iron defeicency anemia with acute post op anemia, transfused after debridment 04/04/12 04/17/2007  . Essential hypertension 04/17/2007    AllGar Ponto, PT 04/10/20 1:55 PM   [facility]eBath[place]one: [phone]:  [phone]: [name]: [phone] of Birth: 4/2April 12, 1957

## 2020-04-15 ENCOUNTER — Telehealth: Payer: Self-pay | Admitting: Physical Therapy

## 2020-04-15 ENCOUNTER — Ambulatory Visit: Payer: Medicare Other | Admitting: Physical Therapy

## 2020-04-15 NOTE — Telephone Encounter (Signed)
Left message regarding no show to appointment today. Left next appointment date and time. Asked that she call the clinic if she would like to reschedule today's appointment.

## 2020-04-22 ENCOUNTER — Ambulatory Visit: Payer: Medicare Other | Attending: Internal Medicine | Admitting: Internal Medicine

## 2020-04-22 ENCOUNTER — Other Ambulatory Visit: Payer: Self-pay | Admitting: Internal Medicine

## 2020-04-22 DIAGNOSIS — Z794 Long term (current) use of insulin: Secondary | ICD-10-CM

## 2020-04-25 ENCOUNTER — Other Ambulatory Visit: Payer: Self-pay

## 2020-04-25 ENCOUNTER — Ambulatory Visit: Payer: Medicare Other

## 2020-04-25 DIAGNOSIS — M6281 Muscle weakness (generalized): Secondary | ICD-10-CM | POA: Diagnosis not present

## 2020-04-25 DIAGNOSIS — R262 Difficulty in walking, not elsewhere classified: Secondary | ICD-10-CM

## 2020-04-25 DIAGNOSIS — R2689 Other abnormalities of gait and mobility: Secondary | ICD-10-CM | POA: Diagnosis not present

## 2020-04-25 NOTE — Patient Instructions (Signed)
Upper back rotation stretch

## 2020-04-27 NOTE — Therapy (Addendum)
[place], [place], [address] Phone: [phone]   Fax:  [phone]  Physical Therapy Treatment/Progress Note  Patient Details  Name: [name] MRN: [id number] Date of Birth: [birth year] Referring Provider (PT): Dr. Karle Plumber   Progress Note Reporting Period  to 01/29/20-04/27/20  See note below for Objective Data and Assessment of Progress/Goals.       Encounter Date: 04/25/2020   PT End of Session - 04/27/20 2038    Visit Number 10    Number of Visits 15    Date for PT Re-Evaluation 05/31/20    Authorization Type Medicare    Progress Note Due on Visit 10    PT Start Time 0921    PT Stop Time 1000    PT Time Calculation (min) 39 min    Activity Tolerance Patient tolerated treatment well    Behavior During Therapy [facility] for tasks assessed/performed           Past Medical History:  Diagnosis Date  . Blind right eye   . Chronic back pain    "my whole back" (03/18/2015)  . Chronic chest pain    takes isosorbide  . Chronic diastolic heart failure ([place]) 9/13   echo 03/20/15 LV Ef of 60-65%, grade 2DD and PA peak pressure: 36 mm Hg   . CKD (chronic kidney disease), stage III   . Coronary artery disease    Dr Sallyanne Kuster, cardiac cath 02-07-2009 -- diffuse 3V CAD involving RCA, CFx, and D1,  pt not a candidate for bypass surgery or angioplasty,  medical management  . Diabetic neuropathy ([place])   . Diabetic retinopathy ([place])    bilateral proliferative  . Diastolic CHF, chronic ([place])   . Dyspnea    "always"  . Edema of both lower extremities   . Generalized weakness   . GERD (gastroesophageal reflux disease)   . Glaucoma, both eyes   . History of non-ST elevation myocardial infarction (NSTEMI)    02-25-2009;  05-15-2009;  01-08-2010;  04-04-2012 this MI was post-op surgery for abscess on 04-03-2012  . History of sepsis   . Hyperlipemia   . Hypertension   . Insulin dependent type 2  diabetes mellitus ([place])    followed by pcp  . Iron deficiency anemia due to chronic blood loss    uterine bleeding  . Legally blind in left eye, as defined in [place]    per pt states vision "is foggy"  . Mild obstructive sleep apnea    study in epic 04-19-2012 mild osa,  recommendation's were wt. loss, dental  appliance, surgery, or cpap  . Morbid obesity with BMI of 40.0-44.9, adult ([place])   . OA (osteoarthritis)    knees  . PSVT (paroxysmal supraventricular tachycardia) ([place])   . Renal insufficiency    pt. denies  . Seasonal allergies   . Wears glasses     Past Surgical History:  Procedure Laterality Date  . BIOPSY  02/13/2018   Procedure: BIOPSY;  Surgeon: Ladene Artist, MD;  [facility];  Service: Endoscopy;;  . BREAST SURGERY Right 2019    breast biopsy,  per pt benign  . CARDIAC CATHETERIZATION  02-27-2009   dr al little   diffuse 3V CAD , involving RCA, CFx, and D1;  inferior wall abnormalities, low normal ef 50%  . CATARACT EXTRACTION Right   . CATARACT EXTRACTION W/ INTRAOCULAR LENS IMPLANT Left 10-11-2007   _0   . COLONOSCOPY WITH PROPOFOL N/A 02/13/2018  Procedure: COLONOSCOPY WITH PROPOFOL;  Surgeon: Ladene Artist, MD;  [facility];  Service: Endoscopy;  Laterality: N/A;  . DILATION AND CURETTAGE OF UTERUS  x2 last one 2000  . EYE SURGERY Bilateral 2009 to 2010   x2  left eye;  x2  right eye   . HYSTEROSCOPY WITH D & C N/A 10/11/2018   Procedure: DILATATION AND CURETTAGE /HYSTEROSCOPY;  Surgeon: [name], MD;  [facility];  Service: Gynecology;  Laterality: N/A;  . INCISION AND DRAINAGE PERIRECTAL ABSCESS  04/03/2012  . INTRAUTERINE DEVICE (IUD) INSERTION  10/11/2018   Procedure: INTRAUTERINE DEVICE (IUD) INSERTION;  Surgeon: [name], MD;  [facility];  Service: Gynecology;;  . IR US GUIDE [place] LEFT  02/13/2018  . IR VENIPUNCTURE 61YRS/OLDER BY MD  02/13/2018  . POLYPECTOMY  02/13/2018   Procedure: POLYPECTOMY;   Surgeon: Ladene Artist, MD;  [facility];  Service: Endoscopy;;    There were no vitals filed for this visit.   Subjective Assessment - 04/27/20 2059    Subjective Pt reports her R knee has been bothering her more the past couple of days. Rates a 3/10.    Patient Stated Goals to have increased stability/ decrease her risk of falls.    Currently in Pain? Yes    Pain Score 5     Pain Location Back    Pain Orientation Lower    Pain Descriptors / Indicators Aching    Pain Type Chronic pain    Pain Onset More than a month ago    Pain Frequency Constant    Aggravating Factors  standing, walking, weather    Pain Relieving Factors Rest    Effect of Pain on Daily Activities difficulty perfroming ADL's                             [facility] Adult PT Treatment/Exercise - 04/27/20 0001      Ambulation/Gait   Ambulation/Gait Yes    Assistive device Rolling walker    Gait Pattern Step-through pattern   incomplete step through; slow pace   Ambulation Surface Level      Exercises   Exercises Knee/Hip;Other Exercises   thoracic     Knee/Hip Exercises: Stretches   Other Knee/Hip Stretches R upper back stretch c R arm pull across and L rotation      Knee/Hip Exercises: Aerobic   Nustep L5; 7 mins; arms and legs      Knee/Hip Exercises: Seated   Long Arc Quad Strengthening;Right;Left;2 sets;15 reps    Long Arc Quad Weight 2 lbs.    Marching 2 sets;15 reps    Marching Limitations RTB    Abduction/Adduction  Strengthening;Right;Left;2 sets;15 reps    Sit to [employer] 2 sets;5 reps   use of BUE on armrest to rise     Manual Therapy   Manual Therapy Soft tissue mobilization    Soft tissue mobilization IASTM c tennis ball against wall                    PT Short Term Goals - 04/10/20 1236      PT SHORT TERM GOAL #1   Title Patient will increase gross bilateral LE strength to 4+/5. On-going-4/5    Status Not Met    Target Date 05/29/20      PT SHORT  TERM GOAL #2   Title Patient will transfer sit to stand 3x  without increased low back pain. Achieved without increase in baseline low back pain.    Status Achieved    Target Date 04/10/20      PT SHORT TERM GOAL #3   Title Patient will ambualte 32' with single point cane without increased low back pain. Achieved- Without increase in baseline low back pain    Status Achieved    Target Date 04/10/20             PT Long Term Goals - 04/10/20 1248      PT LONG TERM GOAL #1   Title Patient will ambualte 200' without increased pain in order to walk in her house. On-going-Walking 200" but low back pain increased    Status On-going    Target Date 05/29/20      PT LONG TERM GOAL #2   Title Patient will report no falls for 3 weeks. Achieved    Target Date 04/10/20      PT LONG TERM GOAL #3   Status Achieved    Target Date 04/10/20      PT LONG TERM GOAL #4   Title Improved Berg to 26/56 for improved balance and decreased fall risk Revised-28/56. New  goal: 32/56    Baseline 17/56    Status Revised    Target Date 05/29/20      PT LONG TERM GOAL #5   Title Improved TUG to 33 sec or less c a SPC for improved quality of gait and balance. Ongoing-33.9 c SPC    Baseline 39.5 sec c SPC    Status On-going                 Plan - 04/27/20 2045    Clinical Impression Statement With recent increase in r knee pain, pt is walking c a RW. PT was provided for LE strengthening and to address her R upper back/scapular pain. Pt was instructed in mid back stretch and use of a tennis ball for self masssage which the pt completed and returned demonsration.    Personal Factors and Comorbidities Comorbidity 1;Comorbidity 2;Comorbidity 3+    Comorbidities bilateral Knee OA, Low back pain, chronic chest pain with activity (uses nitro); CHF, DMII,    Examination-Activity Limitations Caring for Others;Reach Overhead;Stand;Stairs;Squat;Sit;Lift    Examination-Participation Restrictions  Church;Shop;Meal Prep;Community Activity    Clinical Decision Making High    Rehab Potential Fair    PT Frequency 1x / week    PT Duration 6 weeks    PT Treatment/Interventions ADLs/Self Care Home Management;Cryotherapy;Aquatic Therapy;Electrical Stimulation;Iontophoresis 20m/ml Dexamethasone;Traction;Ultrasound;Gait training;Stair training;Therapeutic activities;Functional mobility training;Therapeutic exercise;Balance training;Neuromuscular re-education;Patient/family education;Manual techniques;Passive range of motion;Taping    PT Next Visit Plan Continue strengthening and balance activities. Assess  pt's response to measures to reduce her R mid back/scapular pain.    PT Home Exercise Plan [id number]: LAQ, seated clamshell with red, seated heel-toe raises, seated pillow squeeze, sit<>stand with UE support, modified tandem balance, romberg with head turns, stride weight shifts, standing hip abduction, standing alternating marching    Consulted and Agree with Plan of Care Patient           Patient will benefit from skilled therapeutic intervention in order to improve the following deficits and impairments:  Abnormal gait, Decreased strength, Decreased balance, Decreased activity tolerance, Difficulty walking  Visit Diagnosis: Other abnormalities of gait and mobility  Muscle weakness (generalized)  Difficulty in walking, not elsewhere classified     Problem List Patient Active Problem List   Diagnosis Date Noted  .  Acute respiratory failure with hypoxia ([place]) 01/24/2020  . Chest pain 01/23/2020  . Acute exacerbation of CHF (congestive heart failure) ([place]) 01/23/2020  . Functional urinary incontinence 09/07/2019  . Urge incontinence of urine 09/07/2019  . Legally blind in right eye, as defined in [place] 08/11/2018  . Bilateral carpal tunnel syndrome 04/13/2018  . Adenomatous polyp of colon 04/13/2018  . Fibrocystic breast changes, right 12/12/2017  . Drug-induced constipation  09/15/2017  . Primary osteoarthritis of both knees 04/05/2017  . Chronic bilateral low back pain 03/07/2017  . Chronic pain of right knee 03/07/2017  . Vitamin B12 deficiency 12/09/2016  . Incidental lung nodule, > 43m and < 832m02/13/2018  . Vitreous hemorrhage of right eye ([place]  . Diabetic gastroparesis associated with type 2 diabetes mellitus (HC[place]02/05/2016  . Controlled type 2 diabetes mellitus with complication, with long-term current use of insulin (HCOakdale02/05/2016  . Chronic renal insufficiency, stage III (moderate) 03/19/2015  . Prolonged Q-T interval on ECG 08/10/2013  . Dyslipidemia 04/02/2013  . Acute on chronic diastolic heart failure (HCStrat[place]09/01/2012  . Presumed OSA (obstructive sleep apnea) 04/06/2012  . NSTEMI  post-op 04/04/12-medical Rx 04/04/2012  . Severe obesity (BMI >= 40) (HCLake Santeetlah06/17/2013  . GERD 08/21/2009  . Complex endometrial hyperplasia with atypia 03/20/2009  . PERIPHERAL EDEMA 03/20/2009  . Coronary atherosclerosis-not a surgical or PCI candidate 2010 02/27/2009  . Proliferative diabetic retinopathy (HCNew Lenox03/01/2008  . DETACHED RETINA, BILATERAL, HX OF 09/07/2007  . History of chronic Iron defeicency anemia with acute post op anemia, transfused after debridment 04/04/12 04/17/2007  . Essential hypertension 04/17/2007    AlGar PontoS, PT 04/27/20 9:09 PM  [facility] LanerBennett SpringsNCAlaska[address]hone: [phone] Fax:  [phone]: Elizabeth FINDERRN: [id number] of Birth: 4/Mar 01, 1956

## 2020-04-30 ENCOUNTER — Ambulatory Visit: Payer: Medicare Other

## 2020-05-05 ENCOUNTER — Ambulatory Visit: Payer: Medicare Other | Admitting: Endocrinology

## 2020-05-09 ENCOUNTER — Other Ambulatory Visit: Payer: Self-pay

## 2020-05-09 ENCOUNTER — Ambulatory Visit: Payer: Medicare Other | Attending: Internal Medicine

## 2020-05-09 DIAGNOSIS — R262 Difficulty in walking, not elsewhere classified: Secondary | ICD-10-CM

## 2020-05-09 DIAGNOSIS — M6281 Muscle weakness (generalized): Secondary | ICD-10-CM | POA: Diagnosis not present

## 2020-05-09 DIAGNOSIS — R2689 Other abnormalities of gait and mobility: Secondary | ICD-10-CM | POA: Diagnosis not present

## 2020-05-09 NOTE — Therapy (Signed)
[place], [place], [address] Phone: [phone]   Fax:  [phone]  Physical Therapy Treatment  Patient Details  Name: [name] MRN: [id number] Date of Birth: 1956-04-21 Referring Provider (PT): Dr. Karle Plumber    Encounter Date: 05/09/2020   PT End of Session - 05/09/20 1056    Visit Number 11    Number of Visits 15    Date for PT Re-Evaluation 05/31/20    Authorization Type Medicare    Progress Note Due on Visit 10    PT Start Time 1050    PT Stop Time 1130    PT Time Calculation (min) 40 min    Activity Tolerance Patient tolerated treatment well    Behavior During Therapy [name] for tasks assessed/performed           Past Medical History:  Diagnosis Date  . Blind right eye   . Chronic back pain    "my whole back" (03/18/2015)  . Chronic chest pain    takes isosorbide  . Chronic diastolic heart failure ([place]) 9/13   echo 03/20/15 LV Ef of 60-65%, grade 2DD and PA peak pressure: 36 mm Hg   . CKD (chronic kidney disease), stage III ([place])   . Coronary artery disease    Dr Sallyanne Kuster, cardiac cath 02-07-2009 -- diffuse 3V CAD involving RCA, CFx, and D1,  pt not a candidate for bypass surgery or angioplasty,  medical management  . Diabetic neuropathy ([place])   . Diabetic retinopathy ([place])    bilateral proliferative  . Diastolic CHF, chronic ([place])   . Dyspnea    "always"  . Edema of both lower extremities   . Generalized weakness   . GERD (gastroesophageal reflux disease)   . Glaucoma, both eyes   . History of non-ST elevation myocardial infarction (NSTEMI)    02-25-2009;  05-15-2009;  01-08-2010;  04-04-2012 this MI was post-op surgery for abscess on 04-03-2012  . History of sepsis   . Hyperlipemia   . Hypertension   . Insulin dependent type 2 diabetes mellitus ([place])    followed by pcp  . Iron deficiency anemia due to chronic blood loss    uterine bleeding  . Legally blind in left  eye, as defined in [place]    per pt states vision "is foggy"  . Mild obstructive sleep apnea    study in epic 04-19-2012 mild osa,  recommendation's were wt. loss, dental  appliance, surgery, or cpap  . Morbid obesity with BMI of 40.0-44.9, adult ([place])   . OA (osteoarthritis)    knees  . PSVT (paroxysmal supraventricular tachycardia) ([place])   . Renal insufficiency    pt. denies  . Seasonal allergies   . Wears glasses     Past Surgical History:  Procedure Laterality Date  . BIOPSY  02/13/2018   Procedure: BIOPSY;  Surgeon: Ladene Artist, MD;  [facility];  Service: Endoscopy;;  . BREAST SURGERY Right 2019    breast biopsy,  per pt benign  . CARDIAC CATHETERIZATION  02-27-2009   dr al little   diffuse 3V CAD , involving RCA, CFx, and D1;  inferior wall abnormalities, low normal ef 50%  . CATARACT EXTRACTION Right   . CATARACT EXTRACTION W/ INTRAOCULAR LENS IMPLANT Left 10-11-2007   '@[facility]'   . COLONOSCOPY WITH PROPOFOL N/A 02/13/2018   Procedure: COLONOSCOPY WITH PROPOFOL;  Surgeon: Ladene Artist, MD;  [facility];  Service: Endoscopy;  Laterality: N/A;  .  DILATION AND CURETTAGE OF UTERUS  x2 last one 2000  . EYE SURGERY Bilateral 2009 to 2010   x2  left eye;  x2  right eye   . HYSTEROSCOPY WITH D & C N/A 10/11/2018   Procedure: DILATATION AND CURETTAGE /HYSTEROSCOPY;  Surgeon: [name], MD;  [facility];  Service: Gynecology;  Laterality: N/A;  . INCISION AND DRAINAGE PERIRECTAL ABSCESS  04/03/2012  . INTRAUTERINE DEVICE (IUD) INSERTION  10/11/2018   Procedure: INTRAUTERINE DEVICE (IUD) INSERTION;  Surgeon: [name], MD;  [facility];  Service: Gynecology;;  . IR US GUIDE [place] LEFT  02/13/2018  . IR VENIPUNCTURE 23YRS/OLDER BY MD  02/13/2018  . POLYPECTOMY  02/13/2018   Procedure: POLYPECTOMY;  Surgeon: Ladene Artist, MD;  [facility];  Service: Endoscopy;;    There were no vitals filed for this visit.   Subjective  Assessment - 05/09/20 2204    Subjective Pt reports he low back is bothering her today.    Currently in Pain? Yes    Pain Score 7     Pain Location Back    Pain Orientation Lower    Pain Descriptors / Indicators Aching;Tightness    Pain Type Chronic pain    Pain Onset More than a month ago    Pain Frequency Constant    Aggravating Factors  standing, walking, weather    Pain Relieving Factors Rest    Effect of Pain on Daily Activities difficulty perfroming ADL's                             [facility] Adult PT Treatment/Exercise - 05/09/20 0001      Ambulation/Gait   Ambulation/Gait Yes    Assistive device Straight cane    Gait Pattern Step-through pattern   incomplete step through; slow pace   Ambulation Surface Level      Knee/Hip Exercises: Stretches   Active Hamstring Stretch Right;Left;2 reps;20 seconds    Other Knee/Hip Stretches Seated low back forward flexion stretch 2x, 15 sec      Knee/Hip Exercises: Standing   Hip Abduction Stengthening;Right;Left;2 sets;15 reps      Knee/Hip Exercises: Seated   Long Arc Quad Strengthening;Right;Left;2 sets;15 reps    Long Arc Quad Weight 2 lbs.    Marching 2 sets;15 reps    Marching Limitations RTB    Abduction/Adduction  Strengthening;Right;Left;2 sets;15 reps    Sit to [place] 2 sets;5 reps   use of BUE on armrest to rise              Balance Exercises - 05/09/20 0001      Balance Exercises: Standing   Standing Eyes Closed 3 reps;10 secs   assist as needed   Tandem Stance Eyes open;Upper extremity support 2;3 reps   Partial tandem            PT Education - 05/09/20 2210    Education Details Low back and LE flexibility exs for HEP.    Person(s) Educated Patient    Methods Explanation;Demonstration;Tactile cues;Verbal cues;Handout;Other (comment)    Comprehension Verbalized understanding;Returned demonstration;Verbal cues required;Tactile cues required;Need further instruction            PT Short  Term Goals - 04/10/20 1236      PT SHORT TERM GOAL #1   Title Patient will increase gross bilateral LE strength to 4+/5. On-going-4/5    Status Not Met    Target Date  05/29/20      PT SHORT TERM GOAL #2   Title Patient will transfer sit to stand 3x without increased low back pain. Achieved without increase in baseline low back pain.    Status Achieved    Target Date 04/10/20      PT SHORT TERM GOAL #3   Title Patient will ambualte 45' with single point cane without increased low back pain. Achieved- Without increase in baseline low back pain    Status Achieved    Target Date 04/10/20             PT Long Term Goals - 04/10/20 1248      PT LONG TERM GOAL #1   Title Patient will ambualte 200' without increased pain in order to walk in her house. On-going-Walking 200" but low back pain increased    Status On-going    Target Date 05/29/20      PT LONG TERM GOAL #2   Title Patient will report no falls for 3 weeks. Achieved    Target Date 04/10/20      PT LONG TERM GOAL #3   Status Achieved    Target Date 04/10/20      PT LONG TERM GOAL #4   Title Improved Berg to 26/56 for improved balance and decreased fall risk Revised-28/56. New  goal: 32/56    Baseline 17/56    Status Revised    Target Date 05/29/20      PT LONG TERM GOAL #5   Title Improved TUG to 33 sec or less c a SPC for improved quality of gait and balance. Ongoing-33.9 c SPC    Baseline 39.5 sec c SPC    Status On-going                 Plan - 05/09/20 2214    Clinical Impression Statement With pt having issues with low back pain today, flexibility exs were completed for the low back and hamstrings. Additionally, PT was completed for LE strengthening and balance. Pt continues to walk at a decreased pace and step length and appropriately uses a SPC for balance. Pt reports limited completion of HEP.    Personal Factors and Comorbidities Comorbidity 1;Comorbidity 2;Comorbidity 3+    Comorbidities bilateral  Knee OA, Low back pain, chronic chest pain with activity (uses nitro); CHF, DMII,    Examination-Activity Limitations Caring for Others;Reach Overhead;Stand;Stairs;Squat;Sit;Lift    Examination-Participation Restrictions Church;Shop;Meal Prep;Community Activity    Stability/Clinical Decision Making Unstable/Unpredictable    Clinical Decision Making High    Rehab Potential Fair    PT Frequency 1x / week    PT Duration 2 weeks    PT Treatment/Interventions ADLs/Self Care Home Management;Cryotherapy;Aquatic Therapy;Electrical Stimulation;Iontophoresis 35m/ml Dexamethasone;Traction;Ultrasound;Gait training;Stair training;Therapeutic activities;Functional mobility training;Therapeutic exercise;Balance training;Neuromuscular re-education;Patient/family education;Manual techniques;Passive range of motion;Taping    PT Next Visit Plan Continue strengthening and balance activities. Assess  pt's response to measures to reduce her low back pain. Re-assess TUG and Berg    PT Home Exercise Plan [id number]: LAQ, seated clamshell with red, seated heel-toe raises, seated pillow squeeze, sit<>stand with UE support, modified tandem balance, romberg with head turns, stride weight shifts, standing hip abduction, standing alternating marching    Consulted and Agree with Plan of Care Patient           Patient will benefit from skilled therapeutic intervention in order to improve the following deficits and impairments:  Abnormal gait, Decreased strength, Decreased balance, Decreased activity tolerance, Difficulty walking  Visit Diagnosis: Muscle  weakness (generalized)  Other abnormalities of gait and mobility  Difficulty in walking, not elsewhere classified     Problem List Patient Active Problem List   Diagnosis Date Noted  . Acute respiratory failure with hypoxia ([place]) 01/24/2020  . Chest pain 01/23/2020  . Acute exacerbation of CHF (congestive heart failure) ([place]) 01/23/2020  . Functional urinary  incontinence 09/07/2019  . Urge incontinence of urine 09/07/2019  . Legally blind in right eye, as defined in [place] 08/11/2018  . Bilateral carpal tunnel syndrome 04/13/2018  . Adenomatous polyp of colon 04/13/2018  . Fibrocystic breast changes, right 12/12/2017  . Drug-induced constipation 09/15/2017  . Primary osteoarthritis of both knees 04/05/2017  . Chronic bilateral low back pain 03/07/2017  . Chronic pain of right knee 03/07/2017  . Vitamin B12 deficiency 12/09/2016  . Incidental lung nodule, > 56m and < 831m02/13/2018  . Vitreous hemorrhage of right eye (HCDavis  . Diabetic gastroparesis associated with type 2 diabetes mellitus (HCKahoka02/05/2016  . Controlled type 2 diabetes mellitus with complication, with long-term current use of insulin (HCBloomingdale02/05/2016  . Chronic renal insufficiency, stage III (moderate) (HCEureka08/17/2016  . Prolonged Q-T interval on ECG 08/10/2013  . Dyslipidemia 04/02/2013  . Acute on chronic diastolic heart failure (HCKel[place]09/01/2012  . Presumed OSA (obstructive sleep apnea) 04/06/2012  . NSTEMI  post-op 04/04/12-medical Rx 04/04/2012  . Severe obesity (BMI >= 40) (HCCreswell06/17/2013  . GERD 08/21/2009  . Complex endometrial hyperplasia with atypia 03/20/2009  . PERIPHERAL EDEMA 03/20/2009  . Coronary atherosclerosis-not a surgical or PCI candidate 2010 02/27/2009  . Proliferative diabetic retinopathy (HCRathdrum03/01/2008  . DETACHED RETINA, BILATERAL, HX OF 09/07/2007  . History of chronic Iron defeicency anemia with acute post op anemia, transfused after debridment 04/04/12 04/17/2007  . Essential hypertension 04/17/2007    AlGar PontoS, PT 05/09/20 10:21 PM  [facility]DavisNC[place]hone: [phone] Fax:  [phone]: Elizabeth ENDRESRN: [id number] of Birth: 4/20-Feb-1956

## 2020-05-12 ENCOUNTER — Other Ambulatory Visit: Payer: Self-pay | Admitting: Internal Medicine

## 2020-05-12 DIAGNOSIS — Z794 Long term (current) use of insulin: Secondary | ICD-10-CM

## 2020-05-13 ENCOUNTER — Other Ambulatory Visit: Payer: Self-pay

## 2020-05-13 ENCOUNTER — Ambulatory Visit: Payer: Medicare Other

## 2020-05-13 DIAGNOSIS — R2689 Other abnormalities of gait and mobility: Secondary | ICD-10-CM | POA: Diagnosis not present

## 2020-05-13 DIAGNOSIS — R262 Difficulty in walking, not elsewhere classified: Secondary | ICD-10-CM

## 2020-05-13 DIAGNOSIS — M6281 Muscle weakness (generalized): Secondary | ICD-10-CM

## 2020-05-13 NOTE — Therapy (Signed)
[place], [place], [address] Phone: [phone]   Fax:  [phone]  Physical Therapy Treatment  Patient Details  Name: [name] MRN: [id number] Date of Birth: 03-04-1956 Referring Provider (PT): Dr. Karle Plumber    Encounter Date: 05/13/2020   PT End of Session - 05/13/20 1049    Visit Number 12    Number of Visits 15    Date for PT Re-Evaluation 05/31/20    Authorization Type Medicare    PT Start Time 1046    PT Stop Time 1128    PT Time Calculation (min) 42 min    Activity Tolerance Patient tolerated treatment well    Behavior During Therapy [facility] for tasks assessed/performed           Past Medical History:  Diagnosis Date  . Blind right eye   . Chronic back pain    "my whole back" (03/18/2015)  . Chronic chest pain    takes isosorbide  . Chronic diastolic heart failure ([place]) 9/13   echo 03/20/15 LV Ef of 60-65%, grade 2DD and PA peak pressure: 36 mm Hg   . CKD (chronic kidney disease), stage III ([place])   . Coronary artery disease    Dr Sallyanne Kuster, cardiac cath 02-07-2009 -- diffuse 3V CAD involving RCA, CFx, and D1,  pt not a candidate for bypass surgery or angioplasty,  medical management  . Diabetic neuropathy ([place])   . Diabetic retinopathy ([place])    bilateral proliferative  . Diastolic CHF, chronic ([place])   . Dyspnea    "always"  . Edema of both lower extremities   . Generalized weakness   . GERD (gastroesophageal reflux disease)   . Glaucoma, both eyes   . History of non-ST elevation myocardial infarction (NSTEMI)    02-25-2009;  05-15-2009;  01-08-2010;  04-04-2012 this MI was post-op surgery for abscess on 04-03-2012  . History of sepsis   . Hyperlipemia   . Hypertension   . Insulin dependent type 2 diabetes mellitus ([place])    followed by pcp  . Iron deficiency anemia due to chronic blood loss    uterine bleeding  . Legally blind in left eye, as defined in [place]    per pt  states vision "is foggy"  . Mild obstructive sleep apnea    study in epic 04-19-2012 mild osa,  recommendation's were wt. loss, dental  appliance, surgery, or cpap  . Morbid obesity with BMI of 40.0-44.9, adult ([place])   . OA (osteoarthritis)    knees  . PSVT (paroxysmal supraventricular tachycardia) ([place])   . Renal insufficiency    pt. denies  . Seasonal allergies   . Wears glasses     Past Surgical History:  Procedure Laterality Date  . BIOPSY  02/13/2018   Procedure: BIOPSY;  Surgeon: Ladene Artist, MD;  [facility];  Service: Endoscopy;;  . BREAST SURGERY Right 2019    breast biopsy,  per pt benign  . CARDIAC CATHETERIZATION  02-27-2009   dr al little   diffuse 3V CAD , involving RCA, CFx, and D1;  inferior wall abnormalities, low normal ef 50%  . CATARACT EXTRACTION Right   . CATARACT EXTRACTION W/ INTRAOCULAR LENS IMPLANT Left 10-11-2007   _0   . COLONOSCOPY WITH PROPOFOL N/A 02/13/2018   Procedure: COLONOSCOPY WITH PROPOFOL;  Surgeon: Ladene Artist, MD;  [facility];  Service: Endoscopy;  Laterality: N/A;  . DILATION AND CURETTAGE OF UTERUS  x2 last one  2000  . EYE SURGERY Bilateral 2009 to 2010   x2  left eye;  x2  right eye   . HYSTEROSCOPY WITH D & C N/A 10/11/2018   Procedure: DILATATION AND CURETTAGE /HYSTEROSCOPY;  Surgeon: [name], MD;  [facility];  Service: Gynecology;  Laterality: N/A;  . INCISION AND DRAINAGE PERIRECTAL ABSCESS  04/03/2012  . INTRAUTERINE DEVICE (IUD) INSERTION  10/11/2018   Procedure: INTRAUTERINE DEVICE (IUD) INSERTION;  Surgeon: [name], MD;  [facility];  Service: Gynecology;;  . IR US GUIDE [place] LEFT  02/13/2018  . IR VENIPUNCTURE 24YRS/OLDER BY MD  02/13/2018  . POLYPECTOMY  02/13/2018   Procedure: POLYPECTOMY;  Surgeon: Ladene Artist, MD;  [facility];  Service: Endoscopy;;    There were no vitals filed for this visit.   Subjective Assessment - 05/13/20 1332    Subjective  Pt reports she is feeling better today, That her back is not bothering her. Pt reports using the tennis ball for massage. She states she has been a little more consistent in completing her strengthening exs in sitting    Currently in Pain? No/denies              [facility] PT Assessment - 05/13/20 0001      Balance   Balance Assessed Yes      Standardized Balance Assessment   Standardized Balance Assessment Timed Up and Go Test      Berg Balance Test   Sit to Stand Able to stand without using hands and stabilize independently    Standing Unsupported Able to stand 2 minutes with supervision    Sitting with Back Unsupported but Feet Supported on Floor or Stool Able to sit safely and securely 2 minutes    Stand to Sit Controls descent by using hands    Transfers Able to transfer safely, definite need of hands    Standing Unsupported with Eyes Closed Able to stand 3 seconds    Standing Unsupported with Feet Together Able to place feet together independently and stand for 1 minute with supervision    From Standing, Reach Forward with Outstretched Arm Can reach forward >12 cm safely (5")    From Standing Position, Pick up Object from Floor Unable to pick up and needs supervision    From Standing Position, Turn to Look Behind Over each Shoulder Needs supervision when turning    Turn 360 Degrees Needs close supervision or verbal cueing    Standing Unsupported, Alternately Place Feet on Step/Stool Needs assistance to keep from falling or unable to try    Standing Unsupported, One Foot in Front Able to take small step independently and hold 30 seconds    Standing on One Leg Tries to lift leg/unable to hold 3 seconds but remains standing independently    Total Score 31      Timed Up and Go Test   Normal TUG (seconds) 28.6   28.7, 28.5                        [facility] Adult PT Treatment/Exercise - 05/13/20 0001      Ambulation/Gait   Ambulation/Gait Yes    Assistive device Rolling  walker    Gait Pattern Step-through pattern    Ambulation Surface Level    Gait Comments TUG 28.6 c SPC      Exercises   Exercises Knee/Hip;Other Exercises;Ankle   thoracic     Knee/Hip Exercises: Seated  Long Arc [employer] Strengthening;Right;Left;2 sets;15 reps    Long Arc [employer] 2 lbs.    Marching 2 sets;15 reps    Marching Limitations RTB    Abduction/Adduction  Strengthening;Right;Left;2 sets;15 reps    Sit to Sand 2 sets;10 reps      Ankle Exercises: Seated   Other Seated Ankle Exercises Ankle rocks 10x 2                    PT Short Term Goals - 04/10/20 1236      PT SHORT TERM GOAL #1   Title Patient will increase gross bilateral LE strength to 4+/5. On-going-4/5    Status Not Met    Target Date 05/29/20      PT SHORT TERM GOAL #2   Title Patient will transfer sit to stand 3x without increased low back pain. Achieved without increase in baseline low back pain.    Status Achieved    Target Date 04/10/20      PT SHORT TERM GOAL #3   Title Patient will ambualte 19' with single point cane without increased low back pain. Achieved- Without increase in baseline low back pain    Status Achieved    Target Date 04/10/20             PT Long Term Goals - 05/13/20 1344      PT LONG TERM GOAL #4   Title Improved Berg to 26/56 for improved balance and decreased fall risk Revised-28/56. New  goal: 32/56 On-going - 31/56    Baseline 17/56    Status On-going    Target Date 05/29/20      PT LONG TERM GOAL #5   Title Improved TUG to 33 sec or less c a SPC for improved quality of gait and balance. Achieved - 28.6 c SPC    Baseline 39.5 sec c [facility]    Status Achieved    Target Date 05/13/20                 Plan - 05/13/20 1339    Clinical Impression Statement Re-assessed pt's TUG and Berg today and both were found to be moderately improved vs. eval. The PT session continued to work on LE strengthening nad balance. Pt will benefit from further PT to  address these issues to maximize function and safety    Personal Factors and Comorbidities Comorbidity 1;Comorbidity 2;Comorbidity 3+    Comorbidities bilateral Knee OA, Low back pain, chronic chest pain with activity (uses nitro); CHF, DMII,    Examination-Activity Limitations Caring for Others;Reach Overhead;Stand;Stairs;Squat;Sit;Lift    Examination-Participation Restrictions Church;Shop;Meal Prep;Community Activity    Stability/Clinical Decision Making Unstable/Unpredictable    Clinical Decision Making High    Rehab Potential Fair    PT Frequency 1x / week    PT Duration 2 weeks    PT Treatment/Interventions ADLs/Self Care Home Management;Cryotherapy;Aquatic Therapy;Electrical Stimulation;Iontophoresis 4m/ml Dexamethasone;Traction;Ultrasound;Gait training;Stair training;Therapeutic activities;Functional mobility training;Therapeutic exercise;Balance training;Neuromuscular re-education;Patient/family education;Manual techniques;Passive range of motion;Taping    PT Next Visit Plan Continue strengthening and balance activities.    PT Home Exercise Plan [id number]: LAQ, seated clamshell with red, seated heel-toe raises, seated pillow squeeze, sit<>stand with UE support, modified tandem balance, romberg with head turns, stride weight shifts, standing hip abduction, standing alternating marching    Consulted and Agree with Plan of Care Patient           Patient will benefit from skilled therapeutic intervention in order to improve the following deficits and impairments:  Abnormal gait, Decreased strength, Decreased balance, Decreased activity tolerance, Difficulty walking  Visit Diagnosis: Muscle weakness (generalized)  Other abnormalities of gait and mobility  Difficulty in walking, not elsewhere classified     Problem List Patient Active Problem List   Diagnosis Date Noted  . Acute respiratory failure with hypoxia ([place]) 01/24/2020  . Chest pain 01/23/2020  . Acute exacerbation of  CHF (congestive heart failure) ([place]) 01/23/2020  . Functional urinary incontinence 09/07/2019  . Urge incontinence of urine 09/07/2019  . Legally blind in right eye, as defined in [place] 08/11/2018  . Bilateral carpal tunnel syndrome 04/13/2018  . Adenomatous polyp of colon 04/13/2018  . Fibrocystic breast changes, right 12/12/2017  . Drug-induced constipation 09/15/2017  . Primary osteoarthritis of both knees 04/05/2017  . Chronic bilateral low back pain 03/07/2017  . Chronic pain of right knee 03/07/2017  . Vitamin B12 deficiency 12/09/2016  . Incidental lung nodule, > 44m and < 850m02/13/2018  . Vitreous hemorrhage of right eye (HCRinard  . Diabetic gastroparesis associated with type 2 diabetes mellitus (HCPenitas02/05/2016  . Controlled type 2 diabetes mellitus with complication, with long-term current use of insulin (HCUrich02/05/2016  . Chronic renal insufficiency, stage III (moderate) (HCKoochiching08/17/2016  . Prolonged Q-T interval on ECG 08/10/2013  . Dyslipidemia 04/02/2013  . Acute on chronic diastolic heart failure (HCRoss09/01/2012  . Presumed OSA (obstructive sleep apnea) 04/06/2012  . NSTEMI  post-op 04/04/12-medical Rx 04/04/2012  . Severe obesity (BMI >= 40) (HCHastings06/17/2013  . GERD 08/21/2009  . Complex endometrial hyperplasia with atypia 03/20/2009  . PERIPHERAL EDEMA 03/20/2009  . Coronary atherosclerosis-not a surgical or PCI candidate 2010 02/27/2009  . Proliferative diabetic retinopathy (HCCentre Hall03/01/2008  . DETACHED RETINA, BILATERAL, HX OF 09/07/2007  . History of chronic Iron defeicency anemia with acute post op anemia, transfused after debridment 04/04/12 04/17/2007  . Essential hypertension 04/17/2007    AlGar PontoS, PT 05/13/20 1:49 PM  [facility] [place]hone: [phone] Fax:  [phone]: Elizabeth DEHAANRN: [id number] of Birth: 4/Dec 30, 1955

## 2020-05-20 ENCOUNTER — Ambulatory Visit: Payer: Medicare Other

## 2020-05-27 ENCOUNTER — Encounter: Payer: Self-pay | Admitting: Internal Medicine

## 2020-05-27 ENCOUNTER — Ambulatory Visit: Payer: Medicare Other

## 2020-05-27 ENCOUNTER — Other Ambulatory Visit: Payer: Self-pay

## 2020-05-27 DIAGNOSIS — R2689 Other abnormalities of gait and mobility: Secondary | ICD-10-CM

## 2020-05-27 DIAGNOSIS — M6281 Muscle weakness (generalized): Secondary | ICD-10-CM

## 2020-05-27 DIAGNOSIS — R262 Difficulty in walking, not elsewhere classified: Secondary | ICD-10-CM

## 2020-05-27 NOTE — Therapy (Signed)
[place], [place], [address] Phone: [phone]   Fax:  [phone]  Physical Therapy Treatment/Discharge Summary  Patient Details  Name: [name] MRN: [id number] Date of Birth: May 12, 1956 Referring Provider (PT): Dr. Karle Plumber    Encounter Date: 05/27/2020   PT End of Session - 05/27/20 1057    Visit Number 13    Date for PT Re-Evaluation 05/31/20    Authorization Type Medicare    PT Start Time 1049    PT Stop Time 1130    PT Time Calculation (min) 41 min    Activity Tolerance Patient tolerated treatment well           Past Medical History:  Diagnosis Date  . Blind right eye   . Chronic back pain    "my whole back" (03/18/2015)  . Chronic chest pain    takes isosorbide  . Chronic diastolic heart failure ([place]) 9/13   echo 03/20/15 LV Ef of 60-65%, grade 2DD and PA peak pressure: 36 mm Hg   . CKD (chronic kidney disease), stage III ([place])   . Coronary artery disease    Dr Sallyanne Kuster, cardiac cath 02-07-2009 -- diffuse 3V CAD involving RCA, CFx, and D1,  pt not a candidate for bypass surgery or angioplasty,  medical management  . Diabetic neuropathy ([place])   . Diabetic retinopathy ([place])    bilateral proliferative  . Diastolic CHF, chronic ([place])   . Dyspnea    "always"  . Edema of both lower extremities   . Generalized weakness   . GERD (gastroesophageal reflux disease)   . Glaucoma, both eyes   . History of non-ST elevation myocardial infarction (NSTEMI)    02-25-2009;  05-15-2009;  01-08-2010;  04-04-2012 this MI was post-op surgery for abscess on 04-03-2012  . History of sepsis   . Hyperlipemia   . Hypertension   . Insulin dependent type 2 diabetes mellitus ([place])    followed by pcp  . Iron deficiency anemia due to chronic blood loss    uterine bleeding  . Legally blind in left eye, as defined in [place]    per pt states vision "is foggy"  . Mild obstructive sleep apnea     study in epic 04-19-2012 mild osa,  recommendation's were wt. loss, dental  appliance, surgery, or cpap  . Morbid obesity with BMI of 40.0-44.9, adult ([place])   . OA (osteoarthritis)    knees  . PSVT (paroxysmal supraventricular tachycardia) ([place])   . Renal insufficiency    pt. denies  . Seasonal allergies   . Wears glasses     Past Surgical History:  Procedure Laterality Date  . BIOPSY  02/13/2018   Procedure: BIOPSY;  Surgeon: Ladene Artist, MD;  [facility];  Service: Endoscopy;;  . BREAST SURGERY Right 2019    breast biopsy,  per pt benign  . CARDIAC CATHETERIZATION  02-27-2009   dr al little   diffuse 3V CAD , involving RCA, CFx, and D1;  inferior wall abnormalities, low normal ef 50%  . CATARACT EXTRACTION Right   . CATARACT EXTRACTION W/ INTRAOCULAR LENS IMPLANT Left 10-11-2007   '@[facility]'   . COLONOSCOPY WITH PROPOFOL N/A 02/13/2018   Procedure: COLONOSCOPY WITH PROPOFOL;  Surgeon: Ladene Artist, MD;  [facility];  Service: Endoscopy;  Laterality: N/A;  . DILATION AND CURETTAGE OF UTERUS  x2 last one 2000  . EYE SURGERY Bilateral 2009 to 2010   x2  left eye;  x2  right eye   . HYSTEROSCOPY WITH D & C N/A 10/11/2018   Procedure: DILATATION AND CURETTAGE /HYSTEROSCOPY;  Surgeon: [name], MD;  [facility];  Service: Gynecology;  Laterality: N/A;  . INCISION AND DRAINAGE PERIRECTAL ABSCESS  04/03/2012  . INTRAUTERINE DEVICE (IUD) INSERTION  10/11/2018   Procedure: INTRAUTERINE DEVICE (IUD) INSERTION;  Surgeon: [name], MD;  [facility];  Service: Gynecology;;  . IR US GUIDE [place] LEFT  02/13/2018  . IR VENIPUNCTURE 80YRS/OLDER BY MD  02/13/2018  . POLYPECTOMY  02/13/2018   Procedure: POLYPECTOMY;  Surgeon: Ladene Artist, MD;  [facility];  Service: Endoscopy;;    There were no vitals filed for this visit.   Subjective Assessment - 05/27/20 1423    Subjective Pt reports her low back has continued to fell much better and  has not had nay back pain for several days    Diagnostic tests X:ray L/R: advanced knee degeneration    Patient Stated Goals to have increased stability/ decrease her risk of falls.    Currently in Pain? No/denies    Pain Score 3     Pain Orientation Lower    Pain Descriptors / Indicators Aching;Tightness    Pain Type Chronic pain    Pain Onset More than a month ago    Pain Frequency Intermittent              [facility] PT Assessment - 05/27/20 0001      Strength   Right Hip Flexion 4+/5    Right Hip Extension 4+/5    Right Hip External Rotation  4+/5    Right Hip Internal Rotation 4+/5    Right Hip ABduction 4+/5    Right Hip ADduction 4+/5    Left Hip Flexion 4+/5    Left Hip Extension 4+/5    Left Hip External Rotation 4+/5    Left Hip Internal Rotation 4+/5    Left Hip ABduction 4+/5    Left Hip ADduction 4+/5    Right Knee Flexion 5/5    Right Knee Extension 5/5    Left Knee Flexion 5/5    Left Knee Extension 5/5      Berg Balance Test   Sit to Stand Able to stand without using hands and stabilize independently    Standing Unsupported Able to stand 2 minutes with supervision    Sitting with Back Unsupported but Feet Supported on Floor or Stool Able to sit safely and securely 2 minutes    Stand to Sit Sits safely with minimal use of hands    Transfers Able to transfer safely, minor use of hands    Standing Unsupported with Eyes Closed Able to stand 3 seconds    Standing Unsupported with Feet Together Able to place feet together independently and stand for 1 minute with supervision    From Standing, Reach Forward with Outstretched Arm Can reach forward >12 cm safely (5")    From Standing Position, Pick up Object from Floor Unable to pick up and needs supervision    From Standing Position, Turn to Look Behind Over each Shoulder Needs supervision when turning    Turn 360 Degrees Needs close supervision or verbal cueing    Standing Unsupported, Alternately Place Feet on  Step/Stool Needs assistance to keep from falling or unable to try    Standing Unsupported, One Foot in Front Able to take small step independently and hold 30 seconds    Standing on  One Leg Tries to lift leg/unable to hold 3 seconds but remains standing independently    Total Score 33                         [facility] Adult PT Treatment/Exercise - 05/27/20 0001      Exercises   Exercises Knee/Hip;Other Exercises;Ankle   thoracic     Knee/Hip Exercises: Stretches   Active Hamstring Stretch Right;Left;2 reps;20 seconds    Other Knee/Hip Stretches Seated low back forward flexion stretch 2x, 15 sec      Knee/Hip Exercises: Standing   Heel Raises 20 reps    Heel Raises Limitations toe lifts    Hip Flexion Right;Left;10 reps    Hip Flexion Limitations counter assist    Hip Abduction Right;Left;10 reps    Abduction Limitations counter assist      Knee/Hip Exercises: Seated   Long Arc Quad Right;Left;2 sets;10 reps    Long Arc Quad Weight 2 lbs.    Ball Squeeze 10x, 5 sec    Abduction/Adduction  Right;Left;2 sets;10 reps    Sit to [facility] reps                  PT Education - 05/27/20 1110    Education Details Final HEP    Person(s) Educated Patient    Methods Explanation;Demonstration;Tactile cues;Verbal cues;Handout    Comprehension Returned demonstration;Verbalized understanding            PT Short Term Goals - 05/27/20 1119      PT SHORT TERM GOAL #1   Title Patient will increase gross bilateral LE strength to 4+/5. Achieved    Status Achieved    Target Date 05/27/20             PT Long Term Goals - 05/27/20 1425      PT LONG TERM GOAL #1   Title Patient will ambualte 200' without increased pain in order to walk in her house.    Status Achieved    Target Date 05/27/20      PT LONG TERM GOAL #3   Title Patient will bend to pick object off the floor without falling. Achieved: pt is able to complete c stabilization with 1 hand    Status  Achieved    Target Date 05/27/20      PT LONG TERM GOAL #4   Title Improved Berg to 26/56 for improved balance and decreased fall risk Revised-28/56. New  goal: 32/56 On-going - 31/56. Achieved 33/56    Baseline 17/56    Status Achieved    Target Date 05/27/20                 Plan - 05/27/20 1111    Clinical Impression Statement Pt is Dced from PT with improved low back pain, LE strength, and balance. All set STGs and LTGs were met. Pt has a written HEP to continue her rehab at home    Personal Factors and Comorbidities Comorbidity 1;Comorbidity 2;Comorbidity 3+    Comorbidities bilateral Knee OA, Low back pain, chronic chest pain with activity (uses nitro); CHF, DMII,    Examination-Activity Limitations Caring for Others;Reach Overhead;Stand;Stairs;Squat;Sit;Lift    Examination-Participation Restrictions Church;Shop;Meal Prep;Community Activity    Stability/Clinical Decision Making Unstable/Unpredictable    Clinical Decision Making High    Rehab Potential Fair    PT Frequency 1x / week    PT Duration 2 weeks    PT Treatment/Interventions ADLs/Self Care Home Management;Cryotherapy;Aquatic Therapy;Dealer  Stimulation;Iontophoresis 240m/ml Dexamethasone;Traction;Ultrasound;Gait training;Stair training;Therapeutic activities;Functional mobility training;Therapeutic exercise;Balance training;Neuromuscular re-education;Patient/family education;Manual techniques;Passive range of motion;Taping    PT Home Exercise Plan [id number]: LAQ, seated clamshell with red, seated heel-toe raises, seated pillow squeeze, sit<>stand with UE support, modified tandem balance, romberg with head turns, stride weight shifts, standing hip abduction, standing alternating marching    Consulted and Agree with Plan of Care Patient           Patient will benefit from skilled therapeutic intervention in order to improve the following deficits and impairments:  Abnormal gait, Decreased strength, Decreased balance,  Decreased activity tolerance, Difficulty walking  Visit Diagnosis: Muscle weakness (generalized)  Other abnormalities of gait and mobility  Difficulty in walking, not elsewhere classified     Problem List Patient Active Problem List   Diagnosis Date Noted  . Acute respiratory failure with hypoxia (HCalvert 01/24/2020  . Chest pain 01/23/2020  . Acute exacerbation of CHF (congestive heart failure) (HCastle Shannon 01/23/2020  . Functional urinary incontinence 09/07/2019  . Urge incontinence of urine 09/07/2019  . Legally blind in right eye, as defined in [place]/05/2019  . Bilateral carpal tunnel syndrome 04/13/2018  . Adenomatous polyp of colon 04/13/2018  . Fibrocystic breast changes, right 12/12/2017  . Drug-induced constipation 09/15/2017  . Primary osteoarthritis of both knees 04/05/2017  . Chronic bilateral low back pain 03/07/2017  . Chronic pain of right knee 03/07/2017  . Vitamin B12 deficiency 12/09/2016  . Incidental lung nodule, > 35mand < 40m23m2/13/2018  . Vitreous hemorrhage of right eye (HCCHolley . Diabetic gastroparesis associated with type 2 diabetes mellitus (HCCSomerdale2/05/2016  . Controlled type 2 diabetes mellitus with complication, with long-term current use of insulin (HCCFort Collins2/05/2016  . Chronic renal insufficiency, stage III (moderate) (HCCCohoes8/17/2016  . Prolonged Q-T interval on ECG 08/10/2013  . Dyslipidemia 04/02/2013  . Acute on chronic diastolic heart failure (HCCTibes9/01/2012  . Presumed OSA (obstructive sleep apnea) 04/06/2012  . NSTEMI  post-op 04/04/12-medical Rx 04/04/2012  . Severe obesity (BMI >= 40) (HCCOak Brook6/17/2013  . GERD 08/21/2009  . Complex endometrial hyperplasia with atypia 03/20/2009  . PERIPHERAL EDEMA 03/20/2009  . Coronary atherosclerosis-not a surgical or PCI candidate 2010 02/27/2009  . Proliferative diabetic retinopathy (HCCEmpire3/01/2008  . DETACHED RETINA, BILATERAL, HX OF 09/07/2007  . History of chronic Iron defeicency anemia with acute post  op anemia, transfused after debridment 04/04/12 04/17/2007  . Essential hypertension 04/17/2007    AllGar Ponto, PT 05/27/20 2:46 PM  PHYSICAL THERAPY DISCHARGE SUMMARY  Visits from Start of Care: 13  Current functional level related to goals / functional outcomes: See above   Remaining deficits: See above   Education / Equipment: HEP  Plan: Patient agrees to discharge.  Patient goals were met. Patient is being discharged due to meeting the stated rehab goals.  ?????  And pt being pleased with her progress.       ConNedroweBirminghamC,[place]one: [phone]:  [phone]: AzaHARLIE BUENINGN: [id number] of Birth: [birth year]

## 2020-07-16 DIAGNOSIS — H401133 Primary open-angle glaucoma, bilateral, severe stage: Secondary | ICD-10-CM | POA: Diagnosis not present

## 2020-07-16 DIAGNOSIS — H35372 Puckering of macula, left eye: Secondary | ICD-10-CM | POA: Diagnosis not present

## 2020-07-22 ENCOUNTER — Ambulatory Visit
Admission: RE | Admit: 2020-07-22 | Discharge: 2020-07-22 | Disposition: A | Discharge status: Home / Self Care | Payer: Medicare Other | Source: Ambulatory Visit | Attending: Internal Medicine | Admitting: Internal Medicine

## 2020-07-22 ENCOUNTER — Other Ambulatory Visit: Payer: Self-pay

## 2020-07-22 DIAGNOSIS — Z1231 Encounter for screening mammogram for malignant neoplasm of breast: Secondary | ICD-10-CM | POA: Diagnosis not present

## 2020-08-26 ENCOUNTER — Other Ambulatory Visit: Payer: Self-pay

## 2020-08-26 ENCOUNTER — Ambulatory Visit: Payer: Medicare Other | Attending: Internal Medicine | Admitting: Internal Medicine

## 2020-09-08 ENCOUNTER — Other Ambulatory Visit: Payer: Self-pay | Admitting: Cardiovascular Disease

## 2020-09-08 ENCOUNTER — Other Ambulatory Visit: Payer: Self-pay | Admitting: Internal Medicine

## 2020-09-08 DIAGNOSIS — E876 Hypokalemia: Secondary | ICD-10-CM

## 2020-09-08 DIAGNOSIS — Z794 Long term (current) use of insulin: Secondary | ICD-10-CM

## 2020-09-08 DIAGNOSIS — E1159 Type 2 diabetes mellitus with other circulatory complications: Secondary | ICD-10-CM

## 2020-09-09 NOTE — Telephone Encounter (Signed)
Requested Prescriptions  Pending Prescriptions Disp Refills  . HUMALOG MIX 75/25 (75-25) 100 UNIT/ML SUSP injection [Pharmacy Med Name: HUMALOG 100 U/ml SUSPENSION  VIAL MIX 75 25] 90 mL 3    Sig: INJECT SUBCUTANEOUSLY [place]     Endocrinology:  Diabetes - Insulins Failed - 09/08/2020 10:04 PM      Failed - HBA1C is between 0 and 7.9 and within 180 days    HbA1c, POC (controlled diabetic range)  Date Value Ref Range Status  12/24/2019 10.0 (A) 0.0 - 7.0 % Final   Hgb A1c MFr Bld  Date Value Ref Range Status  01/25/2020 10.1 (H) 4.8 - 5.6 % Final    Comment:    (NOTE) Pre diabetes:          5.7%-6.4%  Diabetes:              >6.4%  Glycemic control for   <7.0% adults with diabetes          Passed - Valid encounter within last 6 months    Recent Outpatient Visits          6 months ago Encounter for [employer] annual wellness exam   [place], [place], NP   7 months ago Hospital discharge follow-up   [place], MD   8 months ago Need for vaccination for Strep pneumoniae   [place], Annie Main L, RPH-CPP   8 months ago Type 2 diabetes mellitus with other circulatory complication, with long-term current use of insulin [facility])   [place] Karle Plumber B, MD   1 year ago Type 2 diabetes mellitus with other circulatory complication, with long-term current use of insulin [facility])   [place] [facility] Ladell Pier, MD

## 2020-09-09 NOTE — Telephone Encounter (Signed)
Requested Prescriptions  Pending Prescriptions Disp Refills  . HUMALOG MIX 75/25 (75-25) 100 UNIT/ML SUSP injection [Pharmacy Med Name: HUMALOG 100 U/ml SUSPENSION  VIAL MIX 75 25] 90 mL 3    Sig: INJECT SUBCUTANEOUSLY [name]     Endocrinology:  Diabetes - Insulins Failed - 09/08/2020 10:04 PM      Failed - HBA1C is between 0 and 7.9 and within 180 days    HbA1c, POC (controlled diabetic range)  Date Value Ref Range Status  12/24/2019 10.0 (A) 0.0 - 7.0 % Final   Hgb A1c MFr Bld  Date Value Ref Range Status  01/25/2020 10.1 (H) 4.8 - 5.6 % Final    Comment:    (NOTE) Pre diabetes:          5.7%-6.4%  Diabetes:              >6.4%  Glycemic control for   <7.0% adults with diabetes          Passed - Valid encounter within last 6 months    Recent Outpatient Visits          6 months ago Encounter for [employer] annual wellness exam   [place], [place], NP   7 months ago Hospital discharge follow-up   [place], MD   8 months ago Need for vaccination for Strep pneumoniae   [place], Annie Main L, RPH-CPP   8 months ago Type 2 diabetes mellitus with other circulatory complication, with long-term current use of insulin [facility])   [place] Karle Plumber B, MD   1 year ago Type 2 diabetes mellitus with other circulatory complication, with long-term current use of insulin [facility])   [place] [facility] Ladell Pier, MD

## 2020-09-10 ENCOUNTER — Other Ambulatory Visit: Payer: Self-pay | Admitting: Family Medicine

## 2020-09-10 DIAGNOSIS — N950 Postmenopausal bleeding: Secondary | ICD-10-CM

## 2020-10-06 ENCOUNTER — Telehealth: Payer: Self-pay | Admitting: Pharmacist

## 2020-10-06 NOTE — Telephone Encounter (Signed)
Prescription refill request received for megestrol. Patient has not been seen since 09/2019. Will need appointment prior to medication refill.

## 2020-10-29 ENCOUNTER — Other Ambulatory Visit: Payer: Self-pay | Admitting: Internal Medicine

## 2020-10-29 DIAGNOSIS — I2510 Atherosclerotic heart disease of native coronary artery without angina pectoris: Secondary | ICD-10-CM

## 2020-10-29 NOTE — Telephone Encounter (Signed)
Requested Prescriptions  Pending Prescriptions Disp Refills  . isosorbide mononitrate (IMDUR) 120 MG 24 hr tablet [Pharmacy Med Name: ISOSORBIDE MONONITRATE  120MG   TAB  EXTENDED RELEASE] 90 tablet 2    Sig: TAKE 1 TABLET BY MOUTH ONCE DAILY     Cardiovascular:  Nitrates Failed - 10/29/2020 11:14 PM      Failed - Last BP in normal range    BP Readings from Last 1 Encounters:  04/03/20 (!) 156/82         Passed - Last Heart Rate in normal range    Pulse Readings from Last 1 Encounters:  02/28/20 87         Passed - Valid encounter within last 12 months    Recent Outpatient Visits          8 months ago Encounter for [employer] annual wellness exam   [place], [place], NP   8 months ago Hospital discharge follow-up   [place], MD   10 months ago Need for vaccination for Strep pneumoniae   [place], Annie Main L, RPH-CPP   10 months ago Type 2 diabetes mellitus with other circulatory complication, with long-term current use of insulin [facility])   [place] Karle Plumber B, MD   1 year ago Type 2 diabetes mellitus with other circulatory complication, with long-term current use of insulin [facility])   [place] [facility] Ladell Pier, MD

## 2020-11-18 ENCOUNTER — Telehealth: Payer: Self-pay | Admitting: Pharmacist

## 2020-11-18 DIAGNOSIS — Z9229 Personal history of other drug therapy: Secondary | ICD-10-CM

## 2020-11-18 NOTE — Progress Notes (Signed)
[facility])                                            [place] Team                                        Statin Quality Measure Assessment    11/18/2020  [name] 09/15/1955 [id number]   Per review of chart and payor information, patient has a diagnosis of clinical ASCVD but is not currently filling a statin.  Atorvastatin 80 mg was last filled 07/15/20.  Plan: Route note to provider Follow up with patient to see if prescription delivered.  Elayne Guerin, PharmD, [place] Clinical Pharmacist [phone]

## 2020-11-30 ENCOUNTER — Emergency Department (HOSPITAL_COMMUNITY): Payer: Medicare Other

## 2020-11-30 ENCOUNTER — Encounter (HOSPITAL_COMMUNITY): Payer: Self-pay | Admitting: Emergency Medicine

## 2020-11-30 ENCOUNTER — Other Ambulatory Visit: Payer: Self-pay

## 2020-11-30 ENCOUNTER — Inpatient Hospital Stay (HOSPITAL_COMMUNITY)
Admission: EM | Admit: 2020-11-30 | Discharge: 2020-12-03 | DRG: 988 | Disposition: A | Discharge status: Home Health | Payer: Medicare Other | Attending: Family Medicine | Admitting: Family Medicine

## 2020-11-30 DIAGNOSIS — R627 Adult failure to thrive: Principal | ICD-10-CM | POA: Diagnosis present

## 2020-11-30 DIAGNOSIS — D500 Iron deficiency anemia secondary to blood loss (chronic): Secondary | ICD-10-CM | POA: Diagnosis present

## 2020-11-30 DIAGNOSIS — N1832 Chronic kidney disease, stage 3b: Secondary | ICD-10-CM | POA: Diagnosis not present

## 2020-11-30 DIAGNOSIS — N1330 Unspecified hydronephrosis: Secondary | ICD-10-CM | POA: Diagnosis not present

## 2020-11-30 DIAGNOSIS — E785 Hyperlipidemia, unspecified: Secondary | ICD-10-CM | POA: Diagnosis present

## 2020-11-30 DIAGNOSIS — D72829 Elevated white blood cell count, unspecified: Secondary | ICD-10-CM | POA: Diagnosis present

## 2020-11-30 DIAGNOSIS — N1339 Other hydronephrosis: Secondary | ICD-10-CM | POA: Diagnosis not present

## 2020-11-30 DIAGNOSIS — R0600 Dyspnea, unspecified: Secondary | ICD-10-CM

## 2020-11-30 DIAGNOSIS — I130 Hypertensive heart and chronic kidney disease with heart failure and stage 1 through stage 4 chronic kidney disease, or unspecified chronic kidney disease: Secondary | ICD-10-CM | POA: Diagnosis not present

## 2020-11-30 DIAGNOSIS — R600 Localized edema: Secondary | ICD-10-CM | POA: Diagnosis present

## 2020-11-30 DIAGNOSIS — N189 Chronic kidney disease, unspecified: Secondary | ICD-10-CM | POA: Diagnosis not present

## 2020-11-30 DIAGNOSIS — N950 Postmenopausal bleeding: Secondary | ICD-10-CM | POA: Diagnosis present

## 2020-11-30 DIAGNOSIS — N179 Acute kidney failure, unspecified: Secondary | ICD-10-CM | POA: Diagnosis not present

## 2020-11-30 DIAGNOSIS — R739 Hyperglycemia, unspecified: Secondary | ICD-10-CM

## 2020-11-30 DIAGNOSIS — R079 Chest pain, unspecified: Secondary | ICD-10-CM | POA: Diagnosis present

## 2020-11-30 DIAGNOSIS — M549 Dorsalgia, unspecified: Secondary | ICD-10-CM | POA: Diagnosis not present

## 2020-11-30 DIAGNOSIS — I252 Old myocardial infarction: Secondary | ICD-10-CM

## 2020-11-30 DIAGNOSIS — G8929 Other chronic pain: Secondary | ICD-10-CM | POA: Diagnosis not present

## 2020-11-30 DIAGNOSIS — Z6841 Body Mass Index (BMI) 40.0 and over, adult: Secondary | ICD-10-CM | POA: Diagnosis not present

## 2020-11-30 DIAGNOSIS — N8500 Endometrial hyperplasia, unspecified: Secondary | ICD-10-CM | POA: Diagnosis present

## 2020-11-30 DIAGNOSIS — I5032 Chronic diastolic (congestive) heart failure: Secondary | ICD-10-CM | POA: Diagnosis present

## 2020-11-30 DIAGNOSIS — I471 Supraventricular tachycardia: Secondary | ICD-10-CM | POA: Diagnosis present

## 2020-11-30 DIAGNOSIS — Z743 Need for continuous supervision: Secondary | ICD-10-CM | POA: Diagnosis not present

## 2020-11-30 DIAGNOSIS — R1030 Lower abdominal pain, unspecified: Secondary | ICD-10-CM | POA: Diagnosis not present

## 2020-11-30 DIAGNOSIS — R531 Weakness: Secondary | ICD-10-CM | POA: Diagnosis not present

## 2020-11-30 DIAGNOSIS — Z20822 Contact with and (suspected) exposure to covid-19: Secondary | ICD-10-CM | POA: Diagnosis present

## 2020-11-30 DIAGNOSIS — E118 Type 2 diabetes mellitus with unspecified complications: Secondary | ICD-10-CM | POA: Diagnosis not present

## 2020-11-30 DIAGNOSIS — E11319 Type 2 diabetes mellitus with unspecified diabetic retinopathy without macular edema: Secondary | ICD-10-CM | POA: Diagnosis present

## 2020-11-30 DIAGNOSIS — R110 Nausea: Secondary | ICD-10-CM | POA: Diagnosis not present

## 2020-11-30 DIAGNOSIS — R112 Nausea with vomiting, unspecified: Secondary | ICD-10-CM | POA: Diagnosis present

## 2020-11-30 DIAGNOSIS — E1165 Type 2 diabetes mellitus with hyperglycemia: Secondary | ICD-10-CM | POA: Diagnosis not present

## 2020-11-30 DIAGNOSIS — E1140 Type 2 diabetes mellitus with diabetic neuropathy, unspecified: Secondary | ICD-10-CM | POA: Diagnosis not present

## 2020-11-30 DIAGNOSIS — Z833 Family history of diabetes mellitus: Secondary | ICD-10-CM

## 2020-11-30 DIAGNOSIS — Z9109 Other allergy status, other than to drugs and biological substances: Secondary | ICD-10-CM

## 2020-11-30 DIAGNOSIS — R059 Cough, unspecified: Secondary | ICD-10-CM

## 2020-11-30 DIAGNOSIS — E860 Dehydration: Secondary | ICD-10-CM | POA: Diagnosis present

## 2020-11-30 DIAGNOSIS — N838 Other noninflammatory disorders of ovary, fallopian tube and broad ligament: Secondary | ICD-10-CM | POA: Diagnosis not present

## 2020-11-30 DIAGNOSIS — R109 Unspecified abdominal pain: Secondary | ICD-10-CM | POA: Diagnosis not present

## 2020-11-30 DIAGNOSIS — H409 Unspecified glaucoma: Secondary | ICD-10-CM | POA: Diagnosis present

## 2020-11-30 DIAGNOSIS — Z803 Family history of malignant neoplasm of breast: Secondary | ICD-10-CM

## 2020-11-30 DIAGNOSIS — E876 Hypokalemia: Secondary | ICD-10-CM | POA: Diagnosis present

## 2020-11-30 DIAGNOSIS — M170 Bilateral primary osteoarthritis of knee: Secondary | ICD-10-CM | POA: Diagnosis present

## 2020-11-30 DIAGNOSIS — Z7982 Long term (current) use of aspirin: Secondary | ICD-10-CM

## 2020-11-30 DIAGNOSIS — Z823 Family history of stroke: Secondary | ICD-10-CM

## 2020-11-30 DIAGNOSIS — Z8371 Family history of colonic polyps: Secondary | ICD-10-CM

## 2020-11-30 DIAGNOSIS — I2510 Atherosclerotic heart disease of native coronary artery without angina pectoris: Secondary | ICD-10-CM | POA: Diagnosis present

## 2020-11-30 DIAGNOSIS — R9389 Abnormal findings on diagnostic imaging of other specified body structures: Secondary | ICD-10-CM | POA: Diagnosis present

## 2020-11-30 DIAGNOSIS — N83202 Unspecified ovarian cyst, left side: Secondary | ICD-10-CM | POA: Diagnosis present

## 2020-11-30 DIAGNOSIS — M545 Low back pain, unspecified: Secondary | ICD-10-CM | POA: Diagnosis not present

## 2020-11-30 DIAGNOSIS — Z79899 Other long term (current) drug therapy: Secondary | ICD-10-CM

## 2020-11-30 DIAGNOSIS — R9431 Abnormal electrocardiogram [ECG] [EKG]: Secondary | ICD-10-CM | POA: Diagnosis present

## 2020-11-30 DIAGNOSIS — Z794 Long term (current) use of insulin: Secondary | ICD-10-CM | POA: Diagnosis not present

## 2020-11-30 DIAGNOSIS — I10 Essential (primary) hypertension: Secondary | ICD-10-CM

## 2020-11-30 DIAGNOSIS — R1111 Vomiting without nausea: Secondary | ICD-10-CM | POA: Diagnosis not present

## 2020-11-30 DIAGNOSIS — Z888 Allergy status to other drugs, medicaments and biological substances status: Secondary | ICD-10-CM

## 2020-11-30 DIAGNOSIS — I4510 Unspecified right bundle-branch block: Secondary | ICD-10-CM | POA: Diagnosis present

## 2020-11-30 DIAGNOSIS — H548 Legal blindness, as defined in USA: Secondary | ICD-10-CM | POA: Diagnosis present

## 2020-11-30 DIAGNOSIS — K219 Gastro-esophageal reflux disease without esophagitis: Secondary | ICD-10-CM | POA: Diagnosis present

## 2020-11-30 DIAGNOSIS — Z800 Family history of malignant neoplasm of digestive organs: Secondary | ICD-10-CM

## 2020-11-30 DIAGNOSIS — E119 Type 2 diabetes mellitus without complications: Secondary | ICD-10-CM | POA: Diagnosis not present

## 2020-11-30 DIAGNOSIS — N8502 Endometrial intraepithelial neoplasia [EIN]: Secondary | ICD-10-CM | POA: Diagnosis not present

## 2020-11-30 DIAGNOSIS — E872 Acidosis: Secondary | ICD-10-CM | POA: Diagnosis present

## 2020-11-30 DIAGNOSIS — K5900 Constipation, unspecified: Secondary | ICD-10-CM | POA: Diagnosis present

## 2020-11-30 DIAGNOSIS — E1122 Type 2 diabetes mellitus with diabetic chronic kidney disease: Secondary | ICD-10-CM | POA: Diagnosis present

## 2020-11-30 DIAGNOSIS — Z8249 Family history of ischemic heart disease and other diseases of the circulatory system: Secondary | ICD-10-CM

## 2020-11-30 DIAGNOSIS — D398 Neoplasm of uncertain behavior of other specified female genital organs: Secondary | ICD-10-CM | POA: Diagnosis not present

## 2020-11-30 LAB — URINALYSIS, ROUTINE W REFLEX MICROSCOPIC
Bacteria, UA: NONE SEEN
Bilirubin Urine: NEGATIVE
Glucose, UA: >=500 mg/dL — AB
Ketones, ur: 5 mg/dL — AB
Leukocytes,Ua: NEGATIVE
Nitrite: NEGATIVE
Protein, ur: 100 mg/dL — AB
RBC / HPF: >50 RBC/hpf — ABNORMAL HIGH (ref 0–5)
Specific Gravity, Urine: 1.006 (ref 1.005–1.030)
pH: 9 — ABNORMAL HIGH (ref 5.0–8.0)

## 2020-11-30 LAB — COMPREHENSIVE METABOLIC PANEL
ALT: 12 U/L (ref 0–44)
AST: 18 U/L (ref 15–41)
Albumin: 3.5 g/dL (ref 3.5–5.0)
Alkaline Phosphatase: 56 U/L (ref 38–126)
Anion gap: 13 (ref 5–15)
BUN: 16 mg/dL (ref 8–23)
CO2: 22 mmol/L (ref 22–32)
Calcium: 8.9 mg/dL (ref 8.9–10.3)
Chloride: 101 mmol/L (ref 98–111)
Creatinine, Ser: 1.14 mg/dL — ABNORMAL HIGH (ref 0.44–1.00)
GFR, Estimated: 53 mL/min — ABNORMAL LOW (ref >60)
Glucose, Bld: 308 mg/dL — ABNORMAL HIGH (ref 70–99)
Potassium: 3.5 mmol/L (ref 3.5–5.1)
Sodium: 136 mmol/L (ref 135–145)
Total Bilirubin: 0.9 mg/dL (ref 0.3–1.2)
Total Protein: 8 g/dL (ref 6.5–8.1)

## 2020-11-30 LAB — CBC WITH DIFFERENTIAL/PLATELET
Abs Immature Granulocytes: 0.06 10*3/uL (ref 0.00–0.07)
Basophils Absolute: 0 10*3/uL (ref 0.0–0.1)
Basophils Relative: 0 %
Eosinophils Absolute: 0 10*3/uL (ref 0.0–0.5)
Eosinophils Relative: 0 %
HCT: 40 % (ref 36.0–46.0)
Hemoglobin: 13.3 g/dL (ref 12.0–15.0)
Immature Granulocytes: 0 %
Lymphocytes Relative: 11 %
Lymphs Abs: 1.4 10*3/uL (ref 0.7–4.0)
MCH: 31 pg (ref 26.0–34.0)
MCHC: 33.3 g/dL (ref 30.0–36.0)
MCV: 93.2 fL (ref 80.0–100.0)
Monocytes Absolute: 0.5 10*3/uL (ref 0.1–1.0)
Monocytes Relative: 4 %
Neutro Abs: 11.4 10*3/uL — ABNORMAL HIGH (ref 1.7–7.7)
Neutrophils Relative %: 85 %
Platelets: 245 10*3/uL (ref 150–400)
RBC: 4.29 MIL/uL (ref 3.87–5.11)
RDW: 12.2 % (ref 11.5–15.5)
WBC: 13.5 10*3/uL — ABNORMAL HIGH (ref 4.0–10.5)
nRBC: 0 % (ref 0.0–0.2)

## 2020-11-30 LAB — I-STAT CHEM 8, ED
BUN: 15 mg/dL (ref 8–23)
Calcium, Ion: 1.06 mmol/L — ABNORMAL LOW (ref 1.15–1.40)
Chloride: 100 mmol/L (ref 98–111)
Creatinine, Ser: 1 mg/dL (ref 0.44–1.00)
Glucose, Bld: 320 mg/dL — ABNORMAL HIGH (ref 70–99)
HCT: 42 % (ref 36.0–46.0)
Hemoglobin: 14.3 g/dL (ref 12.0–15.0)
Potassium: 3.4 mmol/L — ABNORMAL LOW (ref 3.5–5.1)
Sodium: 137 mmol/L (ref 135–145)
TCO2: 24 mmol/L (ref 22–32)

## 2020-11-30 LAB — MAGNESIUM: Magnesium: 2 mg/dL (ref 1.7–2.4)

## 2020-11-30 LAB — CBG MONITORING, ED
Glucose-Capillary: 235 mg/dL — ABNORMAL HIGH (ref 70–99)
Glucose-Capillary: 342 mg/dL — ABNORMAL HIGH (ref 70–99)
Glucose-Capillary: 375 mg/dL — ABNORMAL HIGH (ref 70–99)
Glucose-Capillary: 408 mg/dL — ABNORMAL HIGH (ref 70–99)

## 2020-11-30 MED ORDER — ATORVASTATIN CALCIUM 40 MG PO TABS
80.0000 mg | ORAL_TABLET | Freq: Every evening | ORAL | Status: DC
  Administered 2020-12-01 – 2020-12-09 (×9): 80 mg via ORAL
  Filled 2020-11-30 (×9): qty 2

## 2020-11-30 MED ORDER — INSULIN ASPART 100 UNIT/ML IJ SOLN
0.0000 [IU] | Freq: Every day | INTRAMUSCULAR | Status: DC
  Administered 2020-11-30: 4 [IU] via SUBCUTANEOUS
  Administered 2020-12-02: 2 [IU] via SUBCUTANEOUS
  Administered 2020-12-04 – 2020-12-07 (×2): 3 [IU] via SUBCUTANEOUS
  Filled 2020-11-30: qty 0.05

## 2020-11-30 MED ORDER — IOHEXOL 300 MG/ML  SOLN
100.0000 mL | Freq: Once | INTRAMUSCULAR | Status: AC | PRN
  Administered 2020-11-30: 100 mL via INTRAVENOUS

## 2020-11-30 MED ORDER — LOSARTAN POTASSIUM 50 MG PO TABS
100.0000 mg | ORAL_TABLET | Freq: Every day | ORAL | Status: DC
  Administered 2020-12-01 – 2020-12-10 (×10): 100 mg via ORAL
  Filled 2020-11-30 (×4): qty 2
  Filled 2020-11-30: qty 4
  Filled 2020-11-30 (×6): qty 2

## 2020-11-30 MED ORDER — ONDANSETRON HCL 4 MG/2ML IJ SOLN
4.0000 mg | Freq: Four times a day (QID) | INTRAMUSCULAR | Status: DC | PRN
  Administered 2020-12-02 – 2020-12-06 (×6): 4 mg via INTRAVENOUS
  Filled 2020-11-30 (×6): qty 2

## 2020-11-30 MED ORDER — ISOSORBIDE MONONITRATE ER 60 MG PO TB24
120.0000 mg | ORAL_TABLET | Freq: Every day | ORAL | Status: DC
  Administered 2020-12-01 – 2020-12-10 (×10): 120 mg via ORAL
  Filled 2020-11-30 (×11): qty 2

## 2020-11-30 MED ORDER — HYDROMORPHONE HCL 1 MG/ML IJ SOLN
1.0000 mg | Freq: Once | INTRAMUSCULAR | Status: AC
  Administered 2020-11-30: 1 mg via INTRAVENOUS
  Filled 2020-11-30: qty 1

## 2020-11-30 MED ORDER — DILTIAZEM HCL ER COATED BEADS 240 MG PO CP24
240.0000 mg | ORAL_CAPSULE | Freq: Every day | ORAL | Status: DC
  Administered 2020-12-01 – 2020-12-10 (×10): 240 mg via ORAL
  Filled 2020-11-30 (×11): qty 1

## 2020-11-30 MED ORDER — INSULIN ASPART 100 UNIT/ML IJ SOLN
5.0000 [IU] | Freq: Once | INTRAMUSCULAR | Status: AC
  Administered 2020-11-30: 5 [IU] via SUBCUTANEOUS
  Filled 2020-11-30: qty 0.05

## 2020-11-30 MED ORDER — POTASSIUM CHLORIDE 10 MEQ/100ML IV SOLN
10.0000 meq | INTRAVENOUS | Status: AC
  Administered 2020-12-01 (×4): 10 meq via INTRAVENOUS
  Filled 2020-11-30 (×4): qty 100

## 2020-11-30 MED ORDER — ACETAZOLAMIDE ER 500 MG PO CP12
500.0000 mg | ORAL_CAPSULE | Freq: Two times a day (BID) | ORAL | Status: DC
  Administered 2020-12-01 – 2020-12-08 (×14): 500 mg via ORAL
  Filled 2020-11-30 (×15): qty 1

## 2020-11-30 MED ORDER — ACETAMINOPHEN 325 MG PO TABS
650.0000 mg | ORAL_TABLET | Freq: Four times a day (QID) | ORAL | Status: DC | PRN
  Administered 2020-12-01 – 2020-12-10 (×17): 650 mg via ORAL
  Filled 2020-11-30 (×17): qty 2

## 2020-11-30 MED ORDER — BRIMONIDINE TARTRATE 0.2 % OP SOLN
1.0000 [drp] | Freq: Two times a day (BID) | OPHTHALMIC | Status: DC
  Administered 2020-12-01 – 2020-12-10 (×19): 1 [drp] via OPHTHALMIC
  Filled 2020-11-30: qty 5

## 2020-11-30 MED ORDER — MORPHINE SULFATE (PF) 4 MG/ML IV SOLN
4.0000 mg | Freq: Once | INTRAVENOUS | Status: AC
  Administered 2020-11-30: 4 mg via INTRAVENOUS
  Filled 2020-11-30: qty 1

## 2020-11-30 MED ORDER — BRIMONIDINE TARTRATE-TIMOLOL 0.2-0.5 % OP SOLN: 1.0000 [drp] | Freq: Two times a day (BID) | OPHTHALMIC | Status: DC

## 2020-11-30 MED ORDER — LOSARTAN POTASSIUM 25 MG PO TABS
50.0000 mg | ORAL_TABLET | Freq: Once | ORAL | Status: AC
  Administered 2020-11-30: 50 mg via ORAL
  Filled 2020-11-30: qty 2

## 2020-11-30 MED ORDER — LATANOPROST 0.005 % OP SOLN
1.0000 [drp] | Freq: Every day | OPHTHALMIC | Status: DC
  Administered 2020-12-01 – 2020-12-09 (×9): 1 [drp] via OPHTHALMIC
  Filled 2020-11-30: qty 2.5

## 2020-11-30 MED ORDER — TIMOLOL MALEATE 0.5 % OP SOLN
1.0000 [drp] | Freq: Two times a day (BID) | OPHTHALMIC | Status: DC
  Administered 2020-12-01 – 2020-12-10 (×19): 1 [drp] via OPHTHALMIC
  Filled 2020-11-30: qty 5

## 2020-11-30 MED ORDER — INSULIN DETEMIR 100 UNIT/ML ~~LOC~~ SOLN
7.0000 [IU] | Freq: Two times a day (BID) | SUBCUTANEOUS | Status: DC
  Administered 2020-12-01 – 2020-12-02 (×4): 7 [IU] via SUBCUTANEOUS
  Filled 2020-11-30 (×4): qty 0.07

## 2020-11-30 MED ORDER — INSULIN ASPART 100 UNIT/ML IJ SOLN
0.0000 [IU] | Freq: Three times a day (TID) | INTRAMUSCULAR | Status: DC
  Administered 2020-12-01: 11 [IU] via SUBCUTANEOUS
  Administered 2020-12-01: 2 [IU] via SUBCUTANEOUS
  Administered 2020-12-01: 11 [IU] via SUBCUTANEOUS
  Administered 2020-12-02: 3 [IU] via SUBCUTANEOUS
  Administered 2020-12-02: 15 [IU] via SUBCUTANEOUS
  Administered 2020-12-02: 11 [IU] via SUBCUTANEOUS
  Administered 2020-12-03: 5 [IU] via SUBCUTANEOUS
  Administered 2020-12-03: 3 [IU] via SUBCUTANEOUS
  Administered 2020-12-04: 5 [IU] via SUBCUTANEOUS
  Administered 2020-12-04: 8 [IU] via SUBCUTANEOUS
  Administered 2020-12-05: 3 [IU] via SUBCUTANEOUS
  Administered 2020-12-05 (×2): 5 [IU] via SUBCUTANEOUS
  Administered 2020-12-06 (×2): 3 [IU] via SUBCUTANEOUS
  Administered 2020-12-06: 2 [IU] via SUBCUTANEOUS
  Administered 2020-12-07: 3 [IU] via SUBCUTANEOUS
  Administered 2020-12-08: 8 [IU] via SUBCUTANEOUS
  Administered 2020-12-08: 5 [IU] via SUBCUTANEOUS
  Administered 2020-12-08: 8 [IU] via SUBCUTANEOUS
  Administered 2020-12-09 – 2020-12-10 (×4): 3 [IU] via SUBCUTANEOUS
  Filled 2020-11-30: qty 0.15

## 2020-11-30 MED ORDER — DILTIAZEM HCL ER COATED BEADS 120 MG PO CP24
240.0000 mg | ORAL_CAPSULE | Freq: Once | ORAL | Status: AC
  Administered 2020-11-30: 240 mg via ORAL
  Filled 2020-11-30: qty 2

## 2020-11-30 MED ORDER — FENTANYL CITRATE (PF) 100 MCG/2ML IJ SOLN
25.0000 ug | INTRAMUSCULAR | Status: DC | PRN
  Administered 2020-12-03 – 2020-12-05 (×3): 25 ug via INTRAVENOUS
  Filled 2020-11-30 (×3): qty 2

## 2020-11-30 MED ORDER — METOPROLOL SUCCINATE ER 50 MG PO TB24
200.0000 mg | ORAL_TABLET | Freq: Every day | ORAL | Status: DC
  Administered 2020-12-01 – 2020-12-10 (×10): 200 mg via ORAL
  Filled 2020-11-30 (×10): qty 4

## 2020-11-30 MED ORDER — ACETAMINOPHEN 650 MG RE SUPP: 650.0000 mg | Freq: Four times a day (QID) | RECTAL | Status: DC | PRN

## 2020-11-30 MED ORDER — ONDANSETRON 4 MG PO TBDP
4.0000 mg | ORAL_TABLET | Freq: Once | ORAL | Status: AC
  Administered 2020-11-30: 4 mg via ORAL
  Filled 2020-11-30: qty 1

## 2020-11-30 MED ORDER — FENTANYL CITRATE (PF) 100 MCG/2ML IJ SOLN
50.0000 ug | Freq: Once | INTRAMUSCULAR | Status: AC
  Administered 2020-11-30: 50 ug via INTRAVENOUS
  Filled 2020-11-30: qty 2

## 2020-11-30 MED ORDER — PANTOPRAZOLE SODIUM 40 MG PO TBEC
40.0000 mg | DELAYED_RELEASE_TABLET | Freq: Every day | ORAL | Status: DC
  Administered 2020-12-01 – 2020-12-10 (×10): 40 mg via ORAL
  Filled 2020-11-30 (×10): qty 1

## 2020-11-30 NOTE — Discharge Instructions (Addendum)
You have been evaluated for your postmenopausal vaginal bleeding. There is an ovarian mass on the left side that is likely contribute to your symptoms.  The gynecologic-oncologic office will contact you in the next several days for close outpatient follow up.  If you do not hear from them, please call the office.  Return to the ER if you have any concerns. Your blood sugar is high today, please check it closely.   Advised to follow-up with primary care physician in 1 week. Advised to follow-up with urologis in 1 week. Advised to follow-up with OB/GYN oncologist Dr. Harrington Challenger in 1 week.

## 2020-11-30 NOTE — H&P (Signed)
History and Physical    PLEASE NOTE THAT DRAGON DICTATION SOFTWARE WAS USED IN THE CONSTRUCTION OF THIS NOTE.   [name] SWN:[id number] DOB: January 01, 1956 DOA: 11/30/2020  PCP: Ladell Pier, MD Patient coming from: home   I have personally briefly reviewed patient's old medical records in [place]  Chief Complaint: Nausea vomiting  HPI: [name] is a 65 y.o. female with medical history significant for chronic iron deficiency anemia, type 2 diabetes mellitus, essential hypertension, chronic diastolic heart failure, stage IIIa chronic kidney disease with a baseline creatinine 1.2-1.3, chronic low back pain, who is admitted to [facility] on 11/30/2020 for further evaluation management of generalized weakness after presenting from home to [facility] ED complaining of nausea/vomiting.   Patient reports 1 to 2 days of nausea resulting in at least 6-7 episodes of nonbloody, nonbilious emesis over that time.  She reports very little oral intake over that timeframe.  Most recent episode of vomiting is occurred in the ED today.  Not associate with any coffee-ground appearance or overt hematemesis.  She reports associated lower abdominal discomfort, which she describes as intermittent, nonradiating, and sharp in nature.  Worsening with palpation over the abdomen.  Denies any associated diarrhea or rash.  No recent traveling.  Denies any associated subjective fever, chills, rigors, or generalized myalgias  Not associate with any dysuria, or change in urinary urgency/frequency.  No recent trauma.  She confirms chronic midline low back pain for over a year, and notes continuation of this discomfort in unchanged distribution over the last few days.  She also notes no significant increase in the intensity of her presenting low back pain relative to that which she has experienced for over a year now.  Denies any flank discomfort.   She also notes intermittent vaginal bleeding over the last  1 week, without any increase in frequency over the last few days.  She confirms that she is on a daily baby aspirin as result outpatient blood thinning agent.  Denies any associated chest pain, shortness of breath, palpitations, diaphoresis, dizziness, presyncope, or syncope.  She reports 1 to 2 days of generalized weakness, in the absence of any acute focal weakness.  She also denies any associated acute focal numbness, paresthesias, dysarthria, facial droop, acute change in vision, vertigo, or dysphagia.  No recent known COVID-19 exposures.     ED Course:  Vital signs in the ED were notable for the following: Tetramex 98.7, heart rate 70-89; initial blood pressure 194, over 82, which decreased to 158/66 following multiple IV pain medication doses, as further qualified below, respiratory rate 18-20, oxygen saturation 9700% on room air.  Labs were notable for the following: Point-of-care glucose noted to be 375.  CMP was notable for the following: Potassium 3.4, bicarbonate 22, anion gap 13, BUN 16, creatinine 1.14 relative to baseline range of 1.2-1.3, glucose 08/04/2006, liver enzymes were found to be within normal limits.  CBC notable for white blood cell count of 13,500, hemoglobin 13.3.  Nasopharyngeal COVID-19 PCR was performed in the ED today, with result currently pending.  Urinalysis shows no white blood cells, no bacteria, nitrate negative, leukocyte Estrace negative.  EKG showed sinus rhythm with 1 PVC, heart rate 81, right bundle branch block with QTC 508, nonspecific T wave inversion in leads III, V1 through V4, which appear unchanged relative to most recent EKG from June 2021, no evidence of ST changes, including no evidence of ST elevation.  CT abdomen/pelvis showed moderate left hydroureteronephrosis  to the level of a left adnexal mass without evidence of ureterolithiasis, nephrolithiasis, or obstructing lesion.  CT abdomen/pelvis also showed complex cystic left ovarian mass measuring 5 x 3 x  2 0.6 x 6.1 cm, and otherwise showed no evidence of acute intra-abdominal process.  In light of these CT findings, the patient underwent transabdominal and transvaginal ultrasound of the pelvis, which showed complex cystic mass involving the left adnexa, as well as evidence of thickened endometrium.   The EDP discussed the patient's case, imaging, and history of vaginal bleeding with the on-call OB/GYN, Dr. Ilda Basset, who recommended outpatient follow-up in OB/GYN clinic for further evaluation of the vaginal bleeding aspect, while also recommending checking a CA125 level and contacting oncology for further input regarding left adnexal mass.  Consequently, ADP discussed the patient's case with the on-call oncologist, Dr. Delsa Sale, who recommended outpatient follow-up in her office for further evaluation of left adnexal mass, and did not convey need for elevation for expedited evaluation of this.  While in the ED, the following were administered: In the setting of refractory nausea/vomiting, inability to tolerate p.o., and admitted antiemetic options in the setting of prolonged QTC, the patient was admitted to the med telemetry floor for overnight observation for further evaluation and management.      Review of Systems: As per HPI otherwise 10 point review of systems negative.   Past Medical History:  Diagnosis Date  . Blind right eye   . Chronic back pain    "my whole back" (03/18/2015)  . Chronic chest pain    takes isosorbide  . Chronic diastolic heart failure ([place]) 9/13   echo 03/20/15 LV Ef of 60-65%, grade 2DD and PA peak pressure: 36 mm Hg   . CKD (chronic kidney disease), stage III ([place])   . Coronary artery disease    Dr Sallyanne Kuster, cardiac cath 02-07-2009 -- diffuse 3V CAD involving RCA, CFx, and D1,  pt not a candidate for bypass surgery or angioplasty,  medical management  . Diabetic neuropathy ([place])   . Diabetic retinopathy ([place])    bilateral proliferative  . Diastolic CHF,  chronic ([place])   . Dyspnea    "always"  . Edema of both lower extremities   . Generalized weakness   . GERD (gastroesophageal reflux disease)   . Glaucoma, both eyes   . History of non-ST elevation myocardial infarction (NSTEMI)    02-25-2009;  05-15-2009;  01-08-2010;  04-04-2012 this MI was post-op surgery for abscess on 04-03-2012  . History of sepsis   . Hyperlipemia   . Hypertension   . Insulin dependent type 2 diabetes mellitus ([place])    followed by pcp  . Iron deficiency anemia due to chronic blood loss    uterine bleeding  . Legally blind in left eye, as defined in [place]    per pt states vision "is foggy"  . Mild obstructive sleep apnea    study in epic 04-19-2012 mild osa,  recommendation's were wt. loss, dental  appliance, surgery, or cpap  . Morbid obesity with BMI of 40.0-44.9, adult ([place])   . OA (osteoarthritis)    knees  . PSVT (paroxysmal supraventricular tachycardia) ([place])   . Renal insufficiency    pt. denies  . Seasonal allergies   . Wears glasses     Past Surgical History:  Procedure Laterality Date  . BIOPSY  02/13/2018   Procedure: BIOPSY;  Surgeon: Ladene Artist, MD;  Location: [facility];  Service: Endoscopy;;  . BREAST SURGERY Right 2019  breast biopsy,  per pt benign  . CARDIAC CATHETERIZATION  02-27-2009   dr al little   diffuse 3V CAD , involving RCA, CFx, and D1;  inferior wall abnormalities, low normal ef 50%  . CATARACT EXTRACTION Right   . CATARACT EXTRACTION W/ INTRAOCULAR LENS IMPLANT Left 10-11-2007   _0   . COLONOSCOPY WITH PROPOFOL N/A 02/13/2018   Procedure: COLONOSCOPY WITH PROPOFOL;  Surgeon: Ladene Artist, MD;  Location: [facility];  Service: Endoscopy;  Laterality: N/A;  . DILATION AND CURETTAGE OF UTERUS  x2 last one 2000  . EYE SURGERY Bilateral 2009 to 2010   x2  left eye;  x2  right eye   . HYSTEROSCOPY WITH D & C N/A 10/11/2018   Procedure: DILATATION AND CURETTAGE /HYSTEROSCOPY;  Surgeon: [name], MD;  Location:  [facility] ORS;  Service: Gynecology;  Laterality: N/A;  . INCISION AND DRAINAGE PERIRECTAL ABSCESS  04/03/2012  . INTRAUTERINE DEVICE (IUD) INSERTION  10/11/2018   Procedure: INTRAUTERINE DEVICE (IUD) INSERTION;  Surgeon: [name], MD;  Location: [facility] ORS;  Service: Gynecology;;  . IR US GUIDE [place] LEFT  02/13/2018  . IR VENIPUNCTURE 49YRS/OLDER BY MD  02/13/2018  . POLYPECTOMY  02/13/2018   Procedure: POLYPECTOMY;  Surgeon: Ladene Artist, MD;  Location: [facility];  Service: Endoscopy;;    Social History:  reports that she has never smoked. She has never used smokeless tobacco. She reports that she does not drink alcohol and does not use drugs.   Allergies  Allergen Reactions  . Ativan [Lorazepam] Anaphylaxis  . Orange Fruit [Citrus] Anaphylaxis and Other (See Comments)    Pt stated throat swelling, itching    Family History  Problem Relation Age of Onset  . Diabetes Mother   . Heart disease Mother   . Heart attack Mother   . Hypertension Mother   . Breast cancer Mother   . Colon polyps Father   . Diabetes Father   . Heart disease Father   . Heart attack Father   . Stroke Father   . Hypertension Father   . Diabetes Brother   . Diabetes Sister   . Heart disease Brother   . Heart disease Sister   . Diabetes Maternal Grandmother   . Hypertension Maternal Grandmother   . Hypertension Brother   . Hypertension Sister   . Breast cancer Cousin   . Colon cancer Cousin   . Esophageal cancer Neg Hx   . Rectal cancer Neg Hx   . Stomach cancer Neg Hx      Prior to Admission medications   Medication Sig Start Date End Date Taking? Authorizing Provider  Accu-Chek Softclix Lancets lancets Use as instructed for 3 times daily blood glucose monitoring 12/24/19   Ladell Pier, MD  acetaminophen (TYLENOL) 500 MG tablet Take 1 tablet (500 mg total) by mouth every 6 (six) hours as needed. Patient taking differently: Take 500 mg by mouth every 6 (six) hours as needed (pain.).   09/09/16   Clent Demark, PA-C  acetaZOLAMIDE (DIAMOX) 500 MG capsule Take 500 mg by mouth 2 (two) times daily.    [provider]  aspirin EC 81 MG tablet Take 81 mg by mouth daily.    [provider]  atorvastatin (LIPITOR) 80 MG tablet TAKE 1 TABLET BY MOUTH ONCE DAILY IN THE EVENING AT 6  PM Patient taking differently: Take 80 mg by mouth every evening.  01/22/20   Ladell Pier, MD  bismuth  subsalicylate (PEPTO BISMOL) 262 MG/15ML suspension Take 30 mLs by mouth every 6 (six) hours as needed for indigestion.    [provider]  Blood Glucose Monitoring Suppl (ACCU-CHEK AVIVA PLUS) w/Device KIT Used as directed 09/29/18   Ladell Pier, MD  Blood Pressure Monitoring (BLOOD PRESSURE MONITOR/ARM) DEVI 1 each by Does not apply route daily. ICD 10, I10 and I50.32 03/07/17   Funches, Josalyn, MD  brimonidine-timolol (COMBIGAN) 0.2-0.5 % ophthalmic solution Place 1 drop into both eyes 2 (two) times daily.     [provider]  calcium carbonate (TUMS - DOSED IN MG ELEMENTAL CALCIUM) 500 MG chewable tablet Chew 1 tablet by mouth 3 (three) times daily as needed for indigestion.     [provider]  Cyanocobalamin (VITAMIN B-12) 1000 MCG TABS Take 1,000 mcg by mouth daily. 09/15/17   Ladell Pier, MD  dapagliflozin propanediol (FARXIGA) 5 MG TABS tablet Take 1 tablet (5 mg total) by mouth daily. Patient not taking: Reported on 02/28/2020 02/08/20   Ladell Pier, MD  diltiazem (DILACOR XR) 240 MG 24 hr capsule Take 1 capsule (240 mg total) by mouth daily. 09/15/17   Ladell Pier, MD  ferrous sulfate 325 (65 FE) MG tablet Take 325 mg by mouth daily with breakfast.    [provider]  furosemide (LASIX) 40 MG tablet TAKE 1 TABLET BY MOUTH  TWICE DAILY (TAKE WITH  FUROSEMIDE 80 MG TAB) Patient taking differently: Take 40 mg by mouth 2 (two) times daily. Take along with the 80 mg tablet for total dose of 176m twice daily per patient  with 01/16/20   JLadell Pier MD  furosemide (LASIX) 80 MG tablet TAKE 1 TABLET BY MOUTH TWO  TIMES DAILY WITH FUROSEMIDE 40MG  (TOTAL DOSE OF 120MG  TWO TIMES DAILY ) Patient taking differently: Take 80 mg by mouth 2 (two) times daily. Take along with the 40 mg tablet for total dose of 120 mg twice daily per patient. 01/16/20   JLadell Pier MD  glucose blood test strip Use TID before meals / Dx E11.9 09/29/18   JLadell Pier MD  glucose monitoring kit (FREESTYLE) monitoring kit 1 each by Does not apply route 4 (four) times daily - after meals and at bedtime. 09/12/15   Funches, JAdriana Mccallum MD  HUMALOG MIX 75/25 (75-25) 100 UNIT/ML SUSP injection INJECT SUBCUTANEOUSLY 50  UNITS TWICE DAILY WITH  MEALS 09/09/20   JLadell Pier MD  isosorbide mononitrate (IMDUR) 120 MG 24 hr tablet TAKE 1 TABLET BY MOUTH ONCE DAILY 10/29/20   JLadell Pier MD  losartan (COZAAR) 50 MG tablet Take 2 tablets by mouth once daily 04/04/19   JLadell Pier MD  LUMIGAN 0.01 % SOLN Place 1 drop into the right eye at bedtime.  03/06/14   [provider]  megestrol (MEGACE) 40 MG tablet Take 1 tablet by mouth twice daily 10/24/20   PDonnamae Jude MD  Menthol-Methyl Salicylate (MUSCLE RUB EX) Apply 1 application topically daily as needed (knee pain).     [provider]  metoprolol (TOPROL-XL) 200 MG 24 hr tablet Take 1 tablet by mouth once daily 03/10/20   Croitoru, Mihai, MD  nitroGLYCERIN (NITROSTAT) 0.4 MG SL tablet Place 1 tablet (0.4 mg total) under the tongue every 5 (five) minutes as needed for chest pain. Max 3 doses in 15 minutes. <PLEASE MAKE APPOINTMENT FOR REFILLS> 11/23/19   Croitoru, Mihai, MD  pantoprazole (PROTONIX) 40 MG tablet TAKE  ONE TABLET BY MOUTH ONCE DAILY Patient taking differently: Take 40 mg by mouth daily.  01/13/16   Funches, Adriana Mccallum, MD  potassium chloride SA (KLOR-CON) 20 MEQ tablet TAKE 2 TABLETS BY MOUTH IN  THE MORNING AND 1 TABLET IN THE EVENING 09/09/20    Croitoru, Dani Gobble, MD  RELION INSULIN SYRINGE 31G X 15/64" 1 ML MISC USE AS DIRECTED TWICE DAILY FOR  INSULIN 02/11/20   Ladell Pier, MD     Objective    Physical Exam: Vitals:   11/30/20 2030 11/30/20 2045 11/30/20 2047 11/30/20 2100  BP: (!) 149/68   (!) 158/66  Pulse: 89 88  89  Resp: 18   20  Temp:   97.8 F (36.6 C)   TempSrc:   Oral   SpO2: 100% 99%  97%  Weight:      Height:        General: appears to be stated age; alert, oriented Skin: warm, dry, no rash Head:  AT/[place] Mouth:  Oral mucosa membranes appear dry, normal dentition Neck: supple; trachea midline Heart:  RRR; did not appreciate any M/R/G Lungs: CTAB, did not appreciate any wheezes, rales, or rhonchi Abdomen: + BS; soft, ND; mild tenderness to palpation over the bilateral lower abdominal quadrants in the absence of any associated guarding, rigidity, or rebound tenderness Vascular: 2+ pedal pulses b/l; 2+ radial pulses b/l Extremities: Trace edema in the bilateral lower extremities, no muscle wasting Neuro: strength and sensation intact in upper and lower extremities b/l    Labs on Admission: I have personally reviewed following labs and imaging studies  CBC: Recent Labs  Lab 11/30/20 1420 11/30/20 1438  WBC 13.5*  --   NEUTROABS 11.4*  --   HGB 13.3 14.3  HCT 40.0 42.0  MCV 93.2  --   PLT 245  --    Basic Metabolic Panel: Recent Labs  Lab 11/30/20 1420 11/30/20 1438  NA 136 137  K 3.5 3.4*  CL 101 100  CO2 22  --   GLUCOSE 308* 320*  BUN 16 15  CREATININE 1.14* 1.00  CALCIUM 8.9  --    GFR: Estimated Creatinine Clearance: 71.8 mL/min (by C-G formula based on SCr of 1 mg/dL). Liver Function Tests: Recent Labs  Lab 11/30/20 1420  AST 18  ALT 12  ALKPHOS 56  BILITOT 0.9  PROT 8.0  ALBUMIN 3.5   No results for input(s): LIPASE, AMYLASE in the last 168 hours. No results for input(s): AMMONIA in the last 168 hours. Coagulation Profile: No results for input(s): INR, PROTIME  in the last 168 hours. Cardiac Enzymes: No results for input(s): CKTOTAL, CKMB, CKMBINDEX, TROPONINI in the last 168 hours. BNP (last 3 results) No results for input(s): PROBNP in the last 8760 hours. HbA1C: No results for input(s): HGBA1C in the last 72 hours. CBG: Recent Labs  Lab 11/30/20 1149 11/30/20 1837 11/30/20 1915  GLUCAP 235* 408* 375*   Lipid Profile: No results for input(s): CHOL, HDL, LDLCALC, TRIG, CHOLHDL, LDLDIRECT in the last 72 hours. Thyroid Function Tests: No results for input(s): TSH, T4TOTAL, FREET4, T3FREE, THYROIDAB in the last 72 hours. Anemia Panel: No results for input(s): VITAMINB12, FOLATE, FERRITIN, TIBC, IRON, RETICCTPCT in the last 72 hours. Urine analysis:    Component Value Date/Time   COLORURINE RED (A) 11/30/2020 1509   APPEARANCEUR HAZY (A) 11/30/2020 1509   LABSPEC 1.006 11/30/2020 1509   PHURINE 9.0 (H) 11/30/2020 1509   GLUCOSEU >=500 (A) 11/30/2020 1509   HGBUR LARGE (  A) 11/30/2020 1509   HGBUR large 04/03/2010 0931   BILIRUBINUR NEGATIVE 11/30/2020 1509   KETONESUR 5 (A) 11/30/2020 1509   PROTEINUR 100 (A) 11/30/2020 1509   UROBILINOGEN 0.2 03/22/2013 1159   NITRITE NEGATIVE 11/30/2020 1509   LEUKOCYTESUR NEGATIVE 11/30/2020 1509    Radiological Exams on Admission: CT ABDOMEN PELVIS W CONTRAST  Result Date: 11/30/2020 CLINICAL DATA:  Abdominal pain.  Vaginal bleeding. EXAM: CT ABDOMEN AND PELVIS WITH CONTRAST TECHNIQUE: Multidetector CT imaging of the abdomen and pelvis was performed using the standard protocol following bolus administration of intravenous contrast. CONTRAST:  132m OMNIPAQUE IOHEXOL 300 MG/ML  SOLN COMPARISON:  01/15/2019 FINDINGS: Lower chest: No acute abnormality. Hepatobiliary: No focal liver abnormality is seen. No gallstones, gallbladder wall thickening, or biliary dilatation. Pancreas: Unremarkable. No pancreatic ductal dilatation or surrounding inflammatory changes. Spleen: Normal in size without focal  abnormality. Adrenals/Urinary Tract: Normal adrenal glands. Normal right kidney. No renal mass. Moderate left hydroureteronephrosis to the level of the left adnexa. Normal bladder. Stomach/Bowel: Stomach is within normal limits. No evidence of bowel wall thickening, distention, or inflammatory changes. Vascular/Lymphatic: Normal caliber abdominal aorta with mild atherosclerosis. No lymphadenopathy. Reproductive: Small dystrophic calcifications within the uterus likely reflecting small fibroids. Enlarged left ovary with a complex cystic left ovarian mass measuring 5.3 x 2.6 x 6.1 cm with a few internal septations incompletely characterized on this exam. Other: No ascites.  Small fat containing umbilical hernia. Musculoskeletal: No acute osseous abnormality. No aggressive osseous lesion. IMPRESSION: 1. No acute abdominal or pelvic pathology. 2. Moderate left hydroureteronephrosis to the level of the left adnexal mass without an obstructing lesion. 3. Complex cystic left ovarian mass measuring 5.3 x 2.6 x 6.1 cm. Recommend further characterization with a pelvic ultrasound. Electronically Signed   By: HKathreen Devoid  On: 11/30/2020 15:51   [place]PELVIC COMPLETE W TRANSVAGINAL AND TORSION R/O  Result Date: 11/30/2020 CLINICAL DATA:  Cystic mass in the left ovary. EXAM: TRANSABDOMINAL AND TRANSVAGINAL ULTRASOUND OF PELVIS DOPPLER ULTRASOUND OF OVARIES TECHNIQUE: Both transabdominal and transvaginal ultrasound examinations of the pelvis were performed. Transabdominal technique was performed for global imaging of the pelvis including uterus, ovaries, adnexal regions, and pelvic cul-de-sac. It was necessary to proceed with endovaginal exam following the transabdominal exam to visualize the ovaries. Color and duplex Doppler ultrasound was utilized to evaluate blood flow to the ovaries. COMPARISON:  CT earlier in the same day FINDINGS: Uterus Measurements: 9.9 x 5.4 x 7 cm = volume: 196 mL. There is a 3 cm fibroid Endometrium  Thickness: 9 mm.  No focal abnormality visualized. Right ovary Not visualized Left ovary Measurements: 7.9 x 5.6 x 6.2 cm = volume: 143 mL. There is a complex cystic mass measuring 5.6 x 5.5 x 5.8 cm. This mass demonstrates thick internal septations with possible areas of color Doppler flow involving the septations. Pulsed Doppler evaluation of the left ovary demonstrates normal arterial and venous waveforms. The right ovary was not adequately visualized. Other findings No abnormal free fluid. IMPRESSION: 1. Again noted is a complex cystic mass involving the left adnexa. Ovarian malignancy is not excluded. Gynecologic follow-up is recommended. A nonemergent contrast enhanced MRI of the pelvis may be useful for further characterization. 2. Nonvisualization of the right ovary. 3. Thickened endometrium measuring 9 mm. Endometrial thickness is considered abnormal for an asymptomatic post-menopausal female. Endometrial sampling should be considered to exclude carcinoma. Electronically Signed   By: CConstance HolsterM.D.   On: 11/30/2020 17:56     EKG:  Independently reviewed, with result as described above.    Assessment/Plan   [name] is a 65 y.o. female with medical history significant for chronic iron deficiency anemia, type 2 diabetes mellitus, essential hypertension, chronic diastolic heart failure, stage IIIa chronic kidney disease with a baseline creatinine 1.2-1.3, chronic low back pain, who is admitted to [facility] on 11/30/2020 for further evaluation management of generalized weakness after presenting from home to [facility] ED complaining of nausea/vomiting.    Principal Problem:   Generalized weakness Active Problems:   Essential hypertension   Prolonged Q-T interval on ECG   Controlled type 2 diabetes mellitus with complication, with long-term current use of insulin (HCC)   Nausea & vomiting   Ovarian mass, left   Hypokalemia   Leukocytosis   Chronic diastolic heart failure  (HCC)     #) Generalized weakness: the patient reports 1-2 day duration of generalized weakness, in the absence of any evidence of acute focal neurologic deficits, including no evidence of acute focal weakness. Consequently, acute ischemic CVA is felt to be less likely at this time.  Suspect contribution from dehydration in context of recent nausea/vomiting and inability to tolerate p.o. intake.  No evidence of overt underlying infectious process at this time, including no evidence of urinary tract infection.  COVID-19 PCR has been obtained in the ED today, with result currently pending.  Of note, presenting dehydration is complicated by patient's history of chronic diastolic heart failure, for which she is on Lasix 120 mg p.o. twice daily.  Consequently, we will proceed conservatively with IVF resuscitation, as further described low.  Plan: In the setting of concomitant presenting hypokalemia, will provide IV fluids via IV potassium chloride supplementation, as further noted below.  Check TSH, MMA level.  Also check ionized calcium level.  Physical therapy consult placed for the morning.  Follow for result of screening nasopharyngeal COVID-19 PCR performed in the ED tonight.  Repeat CMP and CBC in the morning.       #) Nausea/vomiting: 6-7 episodes of nonbloody, nonbilious emesis over the last 2 days, affecting the patient any p.o. over that time, including inability to take her home oral medications.  Has been refractory to antiemetic medication administered in the ED today.  Management of her nausea was complicated by prolonged QTC found on presenting EKG, providing limitations from the standpoint of antiemetic choice and aggressiveness.  Furthermore, the possibility of Ativan as an antiemetic in the setting of prolonged QTC is not a viable option for her at this patient, as she reports an anaphylactic response to prior Ativan administration.  We will closely monitor patient on telemetry overnight,  with repeat EKG in the morning.    Plan: As needed low-dose Zofran.  Monitor on telemetry.  Repeat EKG has been ordered for the morning for the rest of trending and QTC, as further detailed below.  Add on serum magnesium level.  Repeat CMP and CBC in the morning.  IV fluids, as noted below.      #) Prolonged QTc: Presenting EKG reflects QTC of 508.   Plan: Add on serum magnesium level 5 as needed supplementation order to maintain this level greater than or equal to 2.0.  Monitor on telemetry.  Repeat EKG has been ordered for the morning.      #) Left ovarian mass: Appears to represent a new finding on imaging performed this evening, including CT abdomen/pelvis as well as transvaginal/trans abdominal ultrasound, with further details as imaging studies conveyed above,  with intermittent vaginal bleeding.  The patient's case and imaging were discussed with the on-call OB/GYN as well as the on-call oncologist, with ensuing recommendations for following up in outpatient oncology clinic for further evaluation of left ovarian mass, without need inpatient evaluation to expedite this work-up, per discussions with on-call oncology, as further detailed above.  Presenting hemoglobin stable, and she appears hemodynamically stable, without evidence of tachycardia or hypotension.  Per discussion with on-call OB/GYN, patient recommended to follow-up in OB/GYN clinic for further evaluation of intermittent vaginal bleeding, as further detailed above.  Plan: Discussions with on-call oncology, patient to be referred to oncology clinic for further evaluation of new finding of left ovarian mass.  Check CA125.  Check INR.  Repeat CBC in the morning.  Referral to OB/GYN clinic upon discharge per recommendations of on-call OB/GYN, as above.       #) Hypokalemia: Presenting labs reflect mildly depressed serum potassium level in the setting of 1 to 2 days of nausea/vomiting and inability to tolerate p.o.   Plan:  Potassium chloride 40 mEq IV over 4 hours x 1 now.  Add on serum magnesium level.  Repeat BMP in the morning.      #) Leukocytosis: Mildly elevated white blood cell count for presenting labs.  No evidence of underlying infectious process at this time, including no evidence of urinary tract infection.  No respiratory symptoms to suggest underlying pneumonia.  Furthermore, COVID-19 PCR was checked in the ED today, with result currently pending.  She is not septic or hypotensive.  Suspect potential multifactorial noninfectious contributions, including relative dehydration given very little oral intake over the last 1 to 2 days as well as increased GI losses in the form of nausea/vomiting over that time.  Additionally, there may be some inflammatory contribution in the context of new finding of left adnexal mass on this evening's imaging.  Plan: Gentle IV fluids via IV potassium supplementation, as above.  Evaluation management of presenting nausea/vomiting, as above.  Repeat CBC in the morning.      #) Type 2 diabetes mellitus: On Humalog 75/25 insulin at a dose of 50 units twice daily along with dapagliflozin as an outpatient.  Patient has been unable to tolerate her oral hypoglycemic over the last few days in the setting of nausea and vomiting over that time.  In this context, presenting glucose noted to be in the 300s, without evidence of anion gap metabolic acidosis.  Suspect some contribution from dehydration, as further noted above she received 5 units of subcutaneous NovoLog in the ED.  Will initiate conservative dosing of basal insulin as well as sliding scale insulin, as further detailed below.  Plan: Start basal insulin in the form of Levemir 7 units 3 times daily, with first dose now.  Accu-Cheks before every meal and at bedtime with moderate dose sliding scale insulin.  Hold home oral hypoglycemic agent during this hospitalization.  Hold home 75/25 Humalog during this  hospitalization.      #) Essential hypertension: Outpatient hypertensive regimen includes Toprol-XL, Imdur, and losartan.  Initial blood pressure noted to be 194/82, which appears to have been associated with elevation from pain, as blood pressure is improved to 158/66 following interval administration of IV pain medication, as above.  Plan: I have ordered resumption of the patient's home and hypertensive regimen to start tomorrow morning, with evaluation management presenting nausea/vomiting.  Close monitoring of ensuing blood pressure via routine vital signs.     #) Chronic diastolic heart failure: Most  recent echocardiogram was performed in June 2021, it was notable for LVEF 65 to 70% with no evidence of focal wall motion abnormalities, left ventricle found to be normal cavity size, mild LVH, and grade 2 diastolic dysfunction without evidence of significant valvular pathology.  Outpatient diuretic regimen consists of Lasix 120 mg p.o. twice daily.  No clinical evidence to suggest acutely decompensated heart failure at this time  Plan: Close monitoring of ensuing volume status, including monitoring of strict I's and O's and daily weights.  Repeat BMP in the morning.  In the setting of refractory nausea/vomiting, very limited oral intake, I have refrained from resuming home Lasix regimen at this time.  Rather, anticipate reevaluation of volume status in the morning as well symptomatology as relates to nausea/vomiting and ability to tolerate p.o. at that time in order to guide timing of resumption of home Lasix regimen.        #) Stage IIIa chronic kidney disease: Associated baseline creatinine 1.2-1.3, with presenting labs reflecting creatinine to be within this baseline range.   Plan: Monitor strict I's and O's and daily weights.  Tempt avoid nephrotoxic agents.  Repeat BMP in the morning.     DVT prophylaxis: SCDs Code Status: Full code Family Communication: none Disposition Plan:  Per Rounding Team Consults called: Case was discussed with the on-call OB/GYN as well as the on-call oncologist, as further detailed above Admission status: Observation; med telemetry.     Of note, this patient was added by me to the following Admit List/Treatment Team: [email or web address].      PLEASE NOTE THAT DRAGON DICTATION SOFTWARE WAS USED IN THE CONSTRUCTION OF THIS NOTE.   [facility] Pager [phone] From [place]  Otherwise, please contact night-coverage  [email or web address] Password [facility]   11/30/2020, 10:15 PM

## 2020-11-30 NOTE — ED Provider Notes (Signed)
[facility] DEPT Provider Note   CSN: [id number] Arrival date & time: 11/30/20  1132     History Chief Complaint  Patient presents with  . Back Pain    [name] is a 65 y.o. female.  HPI      [name] is a 65 y.o. female, with a history of CHF, CKD stage III, DM type II, hyperlipidemia, HTN, PSVT, presenting to the ED with abdominal pain for the past week.   States her abdominal pain is suprapubic, cramping, constant, 10/10.  Separate lower back pain, cramping, constant, 10/10, bilateral.  Accompanied by nausea. She has had abdominal and back pain "every day for the last year," her current pain is worse. Also endorses vaginal bleeding she describes as spotting beginning one week ago.   Last bowel movement was last night.  Denies fever/chills, chest pain, shortness of breath, dizziness, syncope, numbness, weakness, urinary symptoms, vomiting, diarrhea, hematochezia/melena, or any other complaints.   Past Medical History:  Diagnosis Date  . Blind right eye   . Chronic back pain    "my whole back" (03/18/2015)  . Chronic chest pain    takes isosorbide  . Chronic diastolic heart failure ([place]) 9/13   echo 03/20/15 LV Ef of 60-65%, grade 2DD and PA peak pressure: 36 mm Hg   . CKD (chronic kidney disease), stage III ([place])   . Coronary artery disease    Dr Sallyanne Kuster, cardiac cath 02-07-2009 -- diffuse 3V CAD involving RCA, CFx, and D1,  pt not a candidate for bypass surgery or angioplasty,  medical management  . Diabetic neuropathy ([place])   . Diabetic retinopathy ([place])    bilateral proliferative  . Diastolic CHF, chronic ([place])   . Dyspnea    "always"  . Edema of both lower extremities   . Generalized weakness   . GERD (gastroesophageal reflux disease)   . Glaucoma, both eyes   . History of non-ST elevation myocardial infarction (NSTEMI)    02-25-2009;  05-15-2009;  01-08-2010;  04-04-2012 this MI was post-op surgery for abscess on  04-03-2012  . History of sepsis   . Hyperlipemia   . Hypertension   . Insulin dependent type 2 diabetes mellitus ([place])    followed by pcp  . Iron deficiency anemia due to chronic blood loss    uterine bleeding  . Legally blind in left eye, as defined in [place]    per pt states vision "is foggy"  . Mild obstructive sleep apnea    study in epic 04-19-2012 mild osa,  recommendation's were wt. loss, dental  appliance, surgery, or cpap  . Morbid obesity with BMI of 40.0-44.9, adult ([place])   . OA (osteoarthritis)    knees  . PSVT (paroxysmal supraventricular tachycardia) ([place])   . Renal insufficiency    pt. denies  . Seasonal allergies   . Wears glasses     Patient Active Problem List   Diagnosis Date Noted  . Acute respiratory failure with hypoxia ([place]) 01/24/2020  . Chest pain 01/23/2020  . Acute exacerbation of CHF (congestive heart failure) ([place]) 01/23/2020  . Functional urinary incontinence 09/07/2019  . Urge incontinence of urine 09/07/2019  . Legally blind in right eye, as defined in [place] 08/11/2018  . Bilateral carpal tunnel syndrome 04/13/2018  . Adenomatous polyp of colon 04/13/2018  . Fibrocystic breast changes, right 12/12/2017  . Drug-induced constipation 09/15/2017  . Primary osteoarthritis of both knees 04/05/2017  . Chronic bilateral low back pain 03/07/2017  .  Chronic pain of right knee 03/07/2017  . Vitamin B12 deficiency 12/09/2016  . Incidental lung nodule, > 73m and < 878m02/13/2018  . Vitreous hemorrhage of right eye ([facility]  . Diabetic gastroparesis associated with type 2 diabetes mellitus (HCE[place]02/05/2016  . Controlled type 2 diabetes mellitus with complication, with long-term current use of insulin ([facility]. Matthews02/05/2016  . Chronic renal insufficiency, stage III (moderate) ([facility]08/17/2016  . Prolonged Q-T interval on ECG 08/10/2013  . Dyslipidemia 04/02/2013  . Acute on chronic diastolic heart failure ([facility]09/01/2012  . Presumed OSA (obstructive sleep apnea)  04/06/2012  . NSTEMI  post-op 04/04/12-medical Rx 04/04/2012  . Severe obesity (BMI >= 40) (HCFitchburg06/17/2013  . GERD 08/21/2009  . Complex endometrial hyperplasia with atypia 03/20/2009  . PERIPHERAL EDEMA 03/20/2009  . Coronary atherosclerosis-not a surgical or PCI candidate 2010 02/27/2009  . Proliferative diabetic retinopathy ([facility]03/01/2008  . DETACHED RETINA, BILATERAL, HX OF 09/07/2007  . History of chronic Iron defeicency anemia with acute post op anemia, transfused after debridment 04/04/12 04/17/2007  . Essential hypertension 04/17/2007    Past Surgical History:  Procedure Laterality Date  . BIOPSY  02/13/2018   Procedure: BIOPSY;  Surgeon: StLadene ArtistMD;  Location: [facility];  Service: Endoscopy;;  . BREAST SURGERY Right 2019    breast biopsy,  per pt benign  . CARDIAC CATHETERIZATION  02-27-2009   dr al little   diffuse 3V CAD , involving RCA, CFx, and D1;  inferior wall abnormalities, low normal ef 50%  . CATARACT EXTRACTION Right   . CATARACT EXTRACTION W/ INTRAOCULAR LENS IMPLANT Left 10-11-2007   _0   . COLONOSCOPY WITH PROPOFOL N/A 02/13/2018   Procedure: COLONOSCOPY WITH PROPOFOL;  Surgeon: StLadene ArtistMD;  [facility]: [facility];  Service: Endoscopy;  Laterality: N/A;  . DILATION AND CURETTAGE OF UTERUS  x2 last one 2000  . EYE SURGERY Bilateral 2009 to 2010   x2  left eye;  x2  right eye   . HYSTEROSCOPY WITH D & C N/A 10/11/2018   Procedure: DILATATION AND CURETTAGE /HYSTEROSCOPY;  Surgeon: PrDonnamae JudeMD;  Location: [facility];  Service: Gynecology;  Laterality: N/A;  . INCISION AND DRAINAGE PERIRECTAL ABSCESS  04/03/2012  . INTRAUTERINE DEVICE (IUD) INSERTION  10/11/2018   Procedure: INTRAUTERINE DEVICE (IUD) INSERTION;  Surgeon: PrDonnamae JudeMD;  Location: [facility];  Service: Gynecology;;  . IR [place]FT  02/13/2018  . IR VENIPUNCTURE 89YRS/OLDER BY MD  02/13/2018  . POLYPECTOMY  02/13/2018   Procedure: POLYPECTOMY;  Surgeon: StLadene ArtistMD;  Location: [facility];  Service: Endoscopy;;     OB History    Gravida  0   Para  0   Term  0   Preterm  0   AB  0   Living  0     SAB  0   IAB  0   Ectopic  0   Multiple  0   Live Births  0           Family History  Problem Relation Age of Onset  . Diabetes Mother   . Heart disease Mother   . Heart attack Mother   . Hypertension Mother   . Breast cancer Mother   . Colon polyps Father   . Diabetes Father   . Heart disease Father   . Heart attack Father   . Stroke Father   . Hypertension Father   . Diabetes  Brother   . Diabetes Sister   . Heart disease Brother   . Heart disease Sister   . Diabetes Maternal Grandmother   . Hypertension Maternal Grandmother   . Hypertension Brother   . Hypertension Sister   . Breast cancer Cousin   . Colon cancer Cousin   . Esophageal cancer Neg Hx   . Rectal cancer Neg Hx   . Stomach cancer Neg Hx     Social History   Tobacco Use  . Smoking status: Never Smoker  . Smokeless tobacco: Never Used  Vaping Use  . Vaping Use: Never used  Substance Use Topics  . Alcohol use: No  . Drug use: Never    Home Medications Prior to Admission medications   Medication Sig Start Date End Date Taking? Authorizing Provider  Accu-Chek Softclix Lancets lancets Use as instructed for 3 times daily blood glucose monitoring 12/24/19   Ladell Pier, MD  acetaminophen (TYLENOL) 500 MG tablet Take 1 tablet (500 mg total) by mouth every 6 (six) hours as needed. Patient taking differently: Take 500 mg by mouth every 6 (six) hours as needed (pain.).  09/09/16   Clent Demark, PA-C  acetaZOLAMIDE (DIAMOX) 500 MG capsule Take 500 mg by mouth 2 (two) times daily.    [provider]  aspirin EC 81 MG tablet Take 81 mg by mouth daily.    [provider]  atorvastatin (LIPITOR) 80 MG tablet TAKE 1 TABLET BY MOUTH ONCE DAILY IN THE EVENING AT 6  PM Patient taking differently: Take 80 mg by mouth  every evening.  01/22/20   Ladell Pier, MD  bismuth subsalicylate (PEPTO BISMOL) 262 MG/15ML suspension Take 30 mLs by mouth every 6 (six) hours as needed for indigestion.    [provider]  Blood Glucose Monitoring Suppl (ACCU-CHEK AVIVA PLUS) w/Device KIT Used as directed 09/29/18   Ladell Pier, MD  Blood Pressure Monitoring (BLOOD PRESSURE MONITOR/ARM) DEVI 1 each by Does not apply route daily. ICD 10, I10 and I50.32 03/07/17   Funches, Josalyn, MD  brimonidine-timolol (COMBIGAN) 0.2-0.5 % ophthalmic solution Place 1 drop into both eyes 2 (two) times daily.     [provider]  calcium carbonate (TUMS - DOSED IN MG ELEMENTAL CALCIUM) 500 MG chewable tablet Chew 1 tablet by mouth 3 (three) times daily as needed for indigestion.     [provider]  Cyanocobalamin (VITAMIN B-12) 1000 MCG TABS Take 1,000 mcg by mouth daily. 09/15/17   Ladell Pier, MD  dapagliflozin propanediol (FARXIGA) 5 MG TABS tablet Take 1 tablet (5 mg total) by mouth daily. Patient not taking: Reported on 02/28/2020 02/08/20   Ladell Pier, MD  diltiazem (DILACOR XR) 240 MG 24 hr capsule Take 1 capsule (240 mg total) by mouth daily. 09/15/17   Ladell Pier, MD  ferrous sulfate 325 (65 FE) MG tablet Take 325 mg by mouth daily with breakfast.    [provider]  furosemide (LASIX) 40 MG tablet TAKE 1 TABLET BY MOUTH  TWICE DAILY (TAKE WITH  FUROSEMIDE 80 MG TAB) Patient taking differently: Take 40 mg by mouth 2 (two) times daily. Take along with the 80 mg tablet for total dose of $Remov'120mg'TFFpSo$  twice daily per patient with 01/16/20   Ladell Pier, MD  furosemide (LASIX) 80 MG tablet TAKE 1 TABLET BY MOUTH TWO  TIMES DAILY WITH FUROSEMIDE $RemoveBeforeD'40MG'axixOxmDzwYHFd$   (TOTAL DOSE OF $Remov'120MG'orLaND$   TWO TIMES DAILY ) Patient taking differently: Take 80  mg by mouth 2 (two) times daily. Take along with the 40 mg tablet for total dose of 120 mg twice daily per patient. 01/16/20   Ladell Pier, MD  glucose  blood test strip Use TID before meals / Dx E11.9 09/29/18   Ladell Pier, MD  glucose monitoring kit (FREESTYLE) monitoring kit 1 each by Does not apply route 4 (four) times daily - after meals and at bedtime. 09/12/15   Funches, Adriana Mccallum, MD  HUMALOG MIX 75/25 (75-25) 100 UNIT/ML SUSP injection INJECT SUBCUTANEOUSLY 50  UNITS TWICE DAILY WITH  MEALS 09/09/20   Ladell Pier, MD  isosorbide mononitrate (IMDUR) 120 MG 24 hr tablet TAKE 1 TABLET BY MOUTH ONCE DAILY 10/29/20   Ladell Pier, MD  losartan (COZAAR) 50 MG tablet Take 2 tablets by mouth once daily 04/04/19   Ladell Pier, MD  LUMIGAN 0.01 % SOLN Place 1 drop into the right eye at bedtime.  03/06/14   [provider]  megestrol (MEGACE) 40 MG tablet Take 1 tablet by mouth twice daily 10/24/20   [name], MD  Menthol-Methyl Salicylate (MUSCLE RUB EX) Apply 1 application topically daily as needed (knee pain).     [provider]  metoprolol (TOPROL-XL) 200 MG 24 hr tablet Take 1 tablet by mouth once daily 03/10/20   Croitoru, Mihai, MD  nitroGLYCERIN (NITROSTAT) 0.4 MG SL tablet Place 1 tablet (0.4 mg total) under the tongue every 5 (five) minutes as needed for chest pain. Max 3 doses in 15 minutes. <PLEASE MAKE APPOINTMENT FOR REFILLS> 11/23/19   Croitoru, Mihai, MD  pantoprazole (PROTONIX) 40 MG tablet TAKE ONE TABLET BY MOUTH ONCE DAILY Patient taking differently: Take 40 mg by mouth daily.  01/13/16   Funches, Adriana Mccallum, MD  potassium chloride SA (KLOR-CON) 20 MEQ tablet TAKE 2 TABLETS BY MOUTH IN  THE MORNING AND 1 TABLET IN THE EVENING 09/09/20   Croitoru, Mihai, MD  RELION INSULIN SYRINGE 31G X 15/64" 1 ML MISC USE AS DIRECTED TWICE DAILY FOR  INSULIN 02/11/20   Ladell Pier, MD    Allergies    Ativan [lorazepam] and Orange fruit [citrus]  Review of Systems   Review of Systems  Constitutional: Negative for fever.  Respiratory: Negative for shortness of breath.   Cardiovascular: Negative for chest  pain.  Gastrointestinal: Positive for nausea. Negative for diarrhea and vomiting.  Genitourinary: Positive for vaginal bleeding. Negative for difficulty urinating, dysuria and flank pain.  Musculoskeletal: Positive for back pain.  Neurological: Negative for dizziness, syncope, weakness and numbness.  All other systems reviewed and are negative.   Physical Exam Updated Vital Signs BP (!) 205/83 (BP Location: Left Arm)   Pulse 71   Temp 98.7 F (37.1 C) (Oral)   Resp 20   Ht _0  (1.6 m)   Wt 127 kg   LMP 04/02/2012   SpO2 99%   BMI 49.60 kg/m   Physical Exam Vitals and nursing note reviewed.  Constitutional:      General: She is in acute distress (pain).     Appearance: She is well-developed. She is obese. She is not diaphoretic.  HENT:     Head: Normocephalic and atraumatic.     Mouth/Throat:     Mouth: Mucous membranes are moist.     Pharynx: Oropharynx is clear.  Eyes:     Conjunctiva/sclera: Conjunctivae normal.  Cardiovascular:     Rate and Rhythm: Normal rate and regular rhythm.  Pulses: Normal pulses.          Radial pulses are 2+ on the right side and 2+ on the left side.       Posterior tibial pulses are 2+ on the right side and 2+ on the left side.     Heart sounds: Normal heart sounds.     Comments: Tactile temperature in the extremities appropriate and equal bilaterally. Pulmonary:     Effort: Pulmonary effort is normal. No respiratory distress.     Breath sounds: Normal breath sounds.  Abdominal:     Palpations: Abdomen is soft.     Tenderness: There is abdominal tenderness in the suprapubic area. There is no right CVA tenderness, left CVA tenderness or guarding.  Musculoskeletal:     Cervical back: Neck supple.       Back:     Right lower leg: No edema.     Left lower leg: No edema.  Lymphadenopathy:     Cervical: No cervical adenopathy.  Skin:    General: Skin is warm and dry.  Neurological:     Mental Status: She is alert.     Comments: No  noted acute cognitive deficit. Sensation grossly intact to light touch in the extremities.   Grip strengths equal bilaterally.   Strength 4/5 in the extremities, equal bilaterally. Coordination intact.  Cranial nerves III-XII grossly intact.  Handles oral secretions without noted difficulty.  No noted phonation or speech deficit. No facial droop.   Psychiatric:        Mood and Affect: Mood and affect normal.        Speech: Speech normal.        Behavior: Behavior normal.     ED Results / Procedures / Treatments   Labs (all labs ordered are listed, but only abnormal results are displayed) Labs Reviewed  CBC WITH DIFFERENTIAL/PLATELET - Abnormal; Notable for the following components:      Result Value   WBC 13.5 (*)    Neutro Abs 11.4 (*)    All other components within normal limits  CBG MONITORING, ED - Abnormal; Notable for the following components:   Glucose-Capillary 235 (*)    All other components within normal limits  I-STAT CHEM 8, ED - Abnormal; Notable for the following components:   Potassium 3.4 (*)    Glucose, Bld 320 (*)    Calcium, Ion 1.06 (*)    All other components within normal limits  COMPREHENSIVE METABOLIC PANEL  URINALYSIS, ROUTINE W REFLEX MICROSCOPIC    EKG None  Radiology No results found.  Procedures Procedures   Medications Ordered in ED Medications  diltiazem (CARDIZEM CD) 24 hr capsule 240 mg (240 mg Oral Given 11/30/20 1413)  losartan (COZAAR) tablet 50 mg (50 mg Oral Given 11/30/20 1413)  fentaNYL (SUBLIMAZE) injection 50 mcg (50 mcg Intravenous Given 11/30/20 1428)  morphine 4 MG/ML injection 4 mg (4 mg Intravenous Given 11/30/20 1503)  iohexol (OMNIPAQUE) 300 MG/ML solution 100 mL (100 mLs Intravenous Contrast Given 11/30/20 1519)    ED Course  I have reviewed the triage vital signs and the nursing notes.  Pertinent labs & imaging results that were available during my care of the patient were reviewed by me and considered in my medical  decision making (see chart for details).  Clinical Course as of 11/30/20 1529  Sun Nov 30, 2020  1354 BP(!): 215/83 Patient states she has not had her blood pressure medications today. [SJ]    Clinical Course User Index [  SJ] Jennice Renegar, Helane Gunther, PA-C   MDM Rules/Calculators/A&P                          Patient presents with abdominal pain for the last week.  Accompanied by back pain, nausea, vaginal bleeding. Patient is nontoxic appearing, afebrile, not tachycardic, not tachypneic, not hypotensive, maintains excellent SPO2 on room air.   I have reviewed the patient's chart to obtain more information.   Previous abdominal CTs were reviewed.  No aneurysms or vascular abnormalities were noted other than aortic atherosclerosis. My suspicion for dissection is low as the patient has had consistent pain for at least a week, she has no pulse deficits, no neurologic deficits.  Findings and plan of care discussed with attending physician, Lacretia Leigh, MD. Dr. Zenia Resides personally evaluated and examined this patient.  End of shift patient care handoff report given to Domenic Moras, PA-C. Plan: Further lab work and CT abdomen/pelvis pending.  Final Clinical Impression(s) / ED Diagnoses Final diagnoses:  None    Rx / DC Orders ED Discharge Orders    None       Layla Maw 11/30/20 1533    Lacretia Leigh, MD 12/02/20 [phone]

## 2020-11-30 NOTE — ED Triage Notes (Signed)
65 yo female BIBA from home c/o back pain. Pt states this has been a chronic issue since January of 2021. Pt states per EMS that the pain is typically controlled by tylenol but today nothing has been helping to control the pain. Pt states pain is located in her lower back and is causing her to feel weak and nauseous. Pt has a hx of HTN and DM that is controlled with medication that she did not take yet today. Pt denies any urinary complaints at this time.    Vitals per EMS: bp 156/90 manual Hr 60 rr 18 Sat 97% RA cbg 217

## 2020-11-30 NOTE — ED Provider Notes (Addendum)
Received signout from previous providers, please see his note for complete H&P.  In short this is a 66 year old female who has been having low back pain for more than a year but developing vaginal bleeding for approximately 1 week.  She also endorsed some night sweats for the past month.  Plan to obtain abdominal pelvic CT scan for further assessment.  4:06 PM Abdominal pelvis CT scan obtained showing a moderate left hydroureteronephrosis to the level of left adnexal mass without obstructive lesion.  Complex cystic left ovarian mass measuring 5.3 x 2.6 x 6.1 cm we will presents.  Radiologist recommend pelvic ultrasound for further characterization.  Will order pelvic ultrasound for further assessment.  Patient made aware of plan.  7:37 PM Patient still able to make urine.  She does have large hemoglobin and urine dipsticks as well as 100 protein 5 ketones.  No signs of UTI.  Pelvic ultrasound demonstrate a complex cystic mass involving the left adnexa.  Ovarian malignancy is not excluded.  I did reach out to on-call OB/GYN and spoke with Dr. Ilda Basset who recommend obtaining CA125 and also recommend contacting gynecology oncology for close outpatient follow-up.  I have messaged on-call oncologist Dr. Delsa Sale who will have her office set patient up for outpatient follow-up for further work-up.    9:53 PM Pt endorse nausea, due to prolonged QT of 503 we have tried to avoid medication that can prolonged her QT.  She did received 4mg  of zofran ODT.  However, she is unable to tolerates PO and have vomited.  She is weak, unable to support herself upon standing.  Given her decondition state, persistent nausea/vomiting, inability to ambulate and elevated CBG, will consult for admission.    10:08 PM Appreciate consultation from [facility] Dr. Velia Meyer who agrees to see and will admit pt for further care.   BP (!) 158/70   Pulse 85   Temp 98.7 F (37.1 C) (Oral)   Resp 18   Ht 5\' 3"  (1.6  m)   Wt 127 kg   LMP 04/02/2012   SpO2 99%   BMI 49.60 kg/m   Results for orders placed or performed during the hospital encounter of 11/30/20  Comprehensive metabolic panel  Result Value Ref Range   Sodium 136 135 - 145 mmol/L   Potassium 3.5 3.5 - 5.1 mmol/L   Chloride 101 98 - 111 mmol/L   CO2 22 22 - 32 mmol/L   Glucose, Bld 308 (H) 70 - 99 mg/dL   BUN 16 8 - 23 mg/dL   Creatinine, Ser 1.14 (H) 0.44 - 1.00 mg/dL   Calcium 8.9 8.9 - 10.3 mg/dL   Total Protein 8.0 6.5 - 8.1 g/dL   Albumin 3.5 3.5 - 5.0 g/dL   AST 18 15 - 41 U/L   ALT 12 0 - 44 U/L   Alkaline Phosphatase 56 38 - 126 U/L   Total Bilirubin 0.9 0.3 - 1.2 mg/dL   GFR, Estimated 53 (L) >60 mL/min   Anion gap 13 5 - 15  CBC with Differential  Result Value Ref Range   WBC 13.5 (H) 4.0 - 10.5 K/uL   RBC 4.29 3.87 - 5.11 MIL/uL   Hemoglobin 13.3 12.0 - 15.0 g/dL   HCT 40.0 36.0 - 46.0 %   MCV 93.2 80.0 - 100.0 fL   MCH 31.0 26.0 - 34.0 pg   MCHC 33.3 30.0 - 36.0 g/dL   RDW 12.2 11.5 - 15.5 %   Platelets 245 150 - 400  K/uL   nRBC 0.0 0.0 - 0.2 %   Neutrophils Relative % 85 %   Neutro Abs 11.4 (H) 1.7 - 7.7 K/uL   Lymphocytes Relative 11 %   Lymphs Abs 1.4 0.7 - 4.0 K/uL   Monocytes Relative 4 %   Monocytes Absolute 0.5 0.1 - 1.0 K/uL   Eosinophils Relative 0 %   Eosinophils Absolute 0.0 0.0 - 0.5 K/uL   Basophils Relative 0 %   Basophils Absolute 0.0 0.0 - 0.1 K/uL   Immature Granulocytes 0 %   Abs Immature Granulocytes 0.06 0.00 - 0.07 K/uL  Urinalysis, Routine w reflex microscopic  Result Value Ref Range   Color, Urine RED (A) YELLOW   APPearance HAZY (A) CLEAR   Specific Gravity, Urine 1.006 1.005 - 1.030   pH 9.0 (H) 5.0 - 8.0   Glucose, UA >=500 (A) NEGATIVE mg/dL   Hgb urine dipstick LARGE (A) NEGATIVE   Bilirubin Urine NEGATIVE NEGATIVE   Ketones, ur 5 (A) NEGATIVE mg/dL   Protein, ur 100 (A) NEGATIVE mg/dL   Nitrite NEGATIVE NEGATIVE   Leukocytes,Ua NEGATIVE NEGATIVE   RBC / HPF >50 (H) 0  - 5 RBC/hpf   Bacteria, UA NONE SEEN NONE SEEN  CBG monitoring, ED  Result Value Ref Range   Glucose-Capillary 235 (H) 70 - 99 mg/dL  I-stat chem 8, ED (not at [facility] or [facility])  Result Value Ref Range   Sodium 137 135 - 145 mmol/L   Potassium 3.4 (L) 3.5 - 5.1 mmol/L   Chloride 100 98 - 111 mmol/L   BUN 15 8 - 23 mg/dL   Creatinine, Ser 1.00 0.44 - 1.00 mg/dL   Glucose, Bld 320 (H) 70 - 99 mg/dL   Calcium, Ion 1.06 (L) 1.15 - 1.40 mmol/L   TCO2 24 22 - 32 mmol/L   Hemoglobin 14.3 12.0 - 15.0 g/dL   HCT 42.0 36.0 - 46.0 %  CBG monitoring, ED  Result Value Ref Range   Glucose-Capillary 408 (H) 70 - 99 mg/dL  CBG monitoring, ED  Result Value Ref Range   Glucose-Capillary 375 (H) 70 - 99 mg/dL   CT ABDOMEN PELVIS W CONTRAST  Result Date: 11/30/2020 CLINICAL DATA:  Abdominal pain.  Vaginal bleeding. EXAM: CT ABDOMEN AND PELVIS WITH CONTRAST TECHNIQUE: Multidetector CT imaging of the abdomen and pelvis was performed using the standard protocol following bolus administration of intravenous contrast. CONTRAST:  124mL OMNIPAQUE IOHEXOL 300 MG/ML  SOLN COMPARISON:  01/15/2019 FINDINGS: Lower chest: No acute abnormality. Hepatobiliary: No focal liver abnormality is seen. No gallstones, gallbladder wall thickening, or biliary dilatation. Pancreas: Unremarkable. No pancreatic ductal dilatation or surrounding inflammatory changes. Spleen: Normal in size without focal abnormality. Adrenals/Urinary Tract: Normal adrenal glands. Normal right kidney. No renal mass. Moderate left hydroureteronephrosis to the level of the left adnexa. Normal bladder. Stomach/Bowel: Stomach is within normal limits. No evidence of bowel wall thickening, distention, or inflammatory changes. Vascular/Lymphatic: Normal caliber abdominal aorta with mild atherosclerosis. No lymphadenopathy. Reproductive: Small dystrophic calcifications within the uterus likely reflecting small fibroids. Enlarged left ovary with a complex cystic left  ovarian mass measuring 5.3 x 2.6 x 6.1 cm with a few internal septations incompletely characterized on this exam. Other: No ascites.  Small fat containing umbilical hernia. Musculoskeletal: No acute osseous abnormality. No aggressive osseous lesion. IMPRESSION: 1. No acute abdominal or pelvic pathology. 2. Moderate left hydroureteronephrosis to the level of the left adnexal mass without an obstructing lesion. 3. Complex cystic left ovarian mass measuring  5.3 x 2.6 x 6.1 cm. Recommend further characterization with a pelvic ultrasound. Electronically Signed   By: Kathreen Devoid   On: 11/30/2020 15:51   US PELVIC COMPLETE W TRANSVAGINAL AND TORSION R/O  Result Date: 11/30/2020 CLINICAL DATA:  Cystic mass in the left ovary. EXAM: TRANSABDOMINAL AND TRANSVAGINAL ULTRASOUND OF PELVIS DOPPLER ULTRASOUND OF OVARIES TECHNIQUE: Both transabdominal and transvaginal ultrasound examinations of the pelvis were performed. Transabdominal technique was performed for global imaging of the pelvis including uterus, ovaries, adnexal regions, and pelvic cul-de-sac. It was necessary to proceed with endovaginal exam following the transabdominal exam to visualize the ovaries. Color and duplex Doppler ultrasound was utilized to evaluate blood flow to the ovaries. COMPARISON:  CT earlier in the same day FINDINGS: Uterus Measurements: 9.9 x 5.4 x 7 cm = volume: 196 mL. There is a 3 cm fibroid Endometrium Thickness: 9 mm.  No focal abnormality visualized. Right ovary Not visualized Left ovary Measurements: 7.9 x 5.6 x 6.2 cm = volume: 143 mL. There is a complex cystic mass measuring 5.6 x 5.5 x 5.8 cm. This mass demonstrates thick internal septations with possible areas of color Doppler flow involving the septations. Pulsed Doppler evaluation of the left ovary demonstrates normal arterial and venous waveforms. The right ovary was not adequately visualized. Other findings No abnormal free fluid. IMPRESSION: 1. Again noted is a complex cystic  mass involving the left adnexa. Ovarian malignancy is not excluded. Gynecologic follow-up is recommended. A nonemergent contrast enhanced MRI of the pelvis may be useful for further characterization. 2. Nonvisualization of the right ovary. 3. Thickened endometrium measuring 9 mm. Endometrial thickness is considered abnormal for an asymptomatic post-menopausal female. Endometrial sampling should be considered to exclude carcinoma. Electronically Signed   By: Constance Holster M.D.   On: 11/30/2020 17:56        Domenic Moras, PA-C 11/30/20 2033    Domenic Moras, PA-C 11/30/20 2209    Wyvonnia Dusky, MD 12/01/20 [phone]

## 2020-12-01 DIAGNOSIS — N8502 Endometrial intraepithelial neoplasia [EIN]: Secondary | ICD-10-CM | POA: Diagnosis not present

## 2020-12-01 DIAGNOSIS — E876 Hypokalemia: Secondary | ICD-10-CM | POA: Diagnosis present

## 2020-12-01 DIAGNOSIS — I2510 Atherosclerotic heart disease of native coronary artery without angina pectoris: Secondary | ICD-10-CM

## 2020-12-01 DIAGNOSIS — E119 Type 2 diabetes mellitus without complications: Secondary | ICD-10-CM | POA: Diagnosis not present

## 2020-12-01 DIAGNOSIS — N838 Other noninflammatory disorders of ovary, fallopian tube and broad ligament: Secondary | ICD-10-CM | POA: Diagnosis present

## 2020-12-01 DIAGNOSIS — R112 Nausea with vomiting, unspecified: Secondary | ICD-10-CM | POA: Diagnosis present

## 2020-12-01 DIAGNOSIS — D398 Neoplasm of uncertain behavior of other specified female genital organs: Secondary | ICD-10-CM | POA: Diagnosis not present

## 2020-12-01 DIAGNOSIS — D72829 Elevated white blood cell count, unspecified: Secondary | ICD-10-CM | POA: Diagnosis present

## 2020-12-01 DIAGNOSIS — N950 Postmenopausal bleeding: Secondary | ICD-10-CM | POA: Diagnosis not present

## 2020-12-01 DIAGNOSIS — N189 Chronic kidney disease, unspecified: Secondary | ICD-10-CM

## 2020-12-01 DIAGNOSIS — R531 Weakness: Secondary | ICD-10-CM | POA: Diagnosis not present

## 2020-12-01 LAB — COMPREHENSIVE METABOLIC PANEL
ALT: 14 U/L (ref 0–44)
AST: 16 U/L (ref 15–41)
Albumin: 3.4 g/dL — ABNORMAL LOW (ref 3.5–5.0)
Alkaline Phosphatase: 54 U/L (ref 38–126)
Anion gap: 11 (ref 5–15)
BUN: 18 mg/dL (ref 8–23)
CO2: 26 mmol/L (ref 22–32)
Calcium: 9.1 mg/dL (ref 8.9–10.3)
Chloride: 104 mmol/L (ref 98–111)
Creatinine, Ser: 0.87 mg/dL (ref 0.44–1.00)
GFR, Estimated: >60 mL/min (ref >60)
Glucose, Bld: 344 mg/dL — ABNORMAL HIGH (ref 70–99)
Potassium: 4 mmol/L (ref 3.5–5.1)
Sodium: 141 mmol/L (ref 135–145)
Total Bilirubin: 0.4 mg/dL (ref 0.3–1.2)
Total Protein: 7.4 g/dL (ref 6.5–8.1)

## 2020-12-01 LAB — GLUCOSE, CAPILLARY
Glucose-Capillary: 314 mg/dL — ABNORMAL HIGH (ref 70–99)
Glucose-Capillary: 309 mg/dL — ABNORMAL HIGH (ref 70–99)
Glucose-Capillary: 126 mg/dL — ABNORMAL HIGH (ref 70–99)
Glucose-Capillary: 187 mg/dL — ABNORMAL HIGH (ref 70–99)

## 2020-12-01 LAB — CBC WITH DIFFERENTIAL/PLATELET
Abs Immature Granulocytes: 0.04 10*3/uL (ref 0.00–0.07)
Basophils Absolute: 0 10*3/uL (ref 0.0–0.1)
Basophils Relative: 0 %
Eosinophils Absolute: 0 10*3/uL (ref 0.0–0.5)
Eosinophils Relative: 0 %
HCT: 39.1 % (ref 36.0–46.0)
Hemoglobin: 12.7 g/dL (ref 12.0–15.0)
Immature Granulocytes: 0 %
Lymphocytes Relative: 15 %
Lymphs Abs: 2.1 10*3/uL (ref 0.7–4.0)
MCH: 30.8 pg (ref 26.0–34.0)
MCHC: 32.5 g/dL (ref 30.0–36.0)
MCV: 94.7 fL (ref 80.0–100.0)
Monocytes Absolute: 1 10*3/uL (ref 0.1–1.0)
Monocytes Relative: 7 %
Neutro Abs: 10.4 10*3/uL — ABNORMAL HIGH (ref 1.7–7.7)
Neutrophils Relative %: 78 %
Platelets: 252 10*3/uL (ref 150–400)
RBC: 4.13 MIL/uL (ref 3.87–5.11)
RDW: 12.3 % (ref 11.5–15.5)
WBC: 13.6 10*3/uL — ABNORMAL HIGH (ref 4.0–10.5)
nRBC: 0 % (ref 0.0–0.2)

## 2020-12-01 LAB — SARS CORONAVIRUS 2 (TAT 6-24 HRS): SARS Coronavirus 2: NEGATIVE

## 2020-12-01 LAB — MAGNESIUM: Magnesium: 2.1 mg/dL (ref 1.7–2.4)

## 2020-12-01 LAB — PROTIME-INR
INR: 1.2 (ref 0.8–1.2)
Prothrombin Time: 14.8 seconds (ref 11.4–15.2)

## 2020-12-01 LAB — TSH: TSH: 0.243 u[IU]/mL — ABNORMAL LOW (ref 0.350–4.500)

## 2020-12-01 MED ORDER — FUROSEMIDE 40 MG PO TABS: 80.0000 mg | ORAL_TABLET | Freq: Two times a day (BID) | ORAL | Status: DC

## 2020-12-01 MED ORDER — FUROSEMIDE 40 MG PO TABS
120.0000 mg | ORAL_TABLET | Freq: Two times a day (BID) | ORAL | Status: DC
  Administered 2020-12-01 – 2020-12-10 (×18): 120 mg via ORAL
  Filled 2020-12-01 (×18): qty 3

## 2020-12-01 MED ORDER — ALUM & MAG HYDROXIDE-SIMETH 200-200-20 MG/5ML PO SUSP
30.0000 mL | ORAL | Status: DC | PRN
  Administered 2020-12-01 – 2020-12-09 (×4): 30 mL via ORAL
  Filled 2020-12-01 (×4): qty 30

## 2020-12-01 NOTE — Evaluation (Signed)
Physical Therapy Evaluation Patient Details Name: [name] MRN: [id number] DOB: 07/12/1956 Today's Date: 12/01/2020   History of Present Illness  Pt is 65 yo female who presented on 11/30/20 with generalized weakness and c/o N/V.  Per MD weakness likely related to N/V and unable to tolerate P.O. intake and hypokalemia.  Of note, pt also with new L overian mass - to follow up with OB/GYN outpt. Medical hx signficant for chronic iron deficiency anemia, type 2 diabetes mellitus, essential hypertension, chronic diastolic heart failure, stage IIIa chronic kidney disease with a baseline creatinine 1.2-1.3, chronic low back pain, blind R eye, low vision L eye.    Clinical Impression  Pt admitted with above diagnosis. Pt presenting with decreased mobility, strength, endurance, balance, and safety.  Additionally, pt with chronic c/o dizziness for a few months.  Pt was able to transfer to chair with min A for bed mobility and min guard for OOB.  Unable to ambulate more than 3' due to dizziness and nausea.  Blood pressure was stable with changes in position.  Pt unable to tolerate further testing.  Pt reports her dizziness is no worse than baseline of 2-3 months - could benefit from further vestibular testing/screening but unable to tolerate today.  Pt with fair rehab potential and will likely progress to near baseline to be able to return home with family and HHPT. Pt currently with functional limitations due to the deficits listed below (see PT Problem List). Pt will benefit from skilled PT to increase their independence and safety with mobility to allow discharge to the venue listed below.       Follow Up Recommendations Home health PT;Supervision/Assistance - 24 hour    Equipment Recommendations  None recommended by PT (has dme)    Recommendations for Other Services       Precautions / Restrictions Precautions Precautions: Fall      Mobility  Bed Mobility Overal bed mobility: Needs  Assistance Bed Mobility: Supine to Sit     Supine to sit: Min assist     General bed mobility comments: use of rails and increased time    Transfers Overall transfer level: Needs assistance Equipment used: Rolling walker (2 wheeled) Transfers: Sit to/from Stand Sit to Stand: Min guard         General transfer comment: Min guard to rise for safety  Ambulation/Gait Ambulation/Gait assistance: Min guard Gait Distance (Feet): 3 Feet Assistive device: Rolling walker (2 wheeled) Gait Pattern/deviations: Step-to pattern;Decreased stride length;Shuffle Gait velocity: decreased   General Gait Details: small steps to chair with RW, limited by c/o dizziness (see general comments)  Stairs            Wheelchair Mobility    Modified Rankin (Stroke Patients Only)       Balance Overall balance assessment: Needs assistance Sitting-balance support: No upper extremity supported Sitting balance-Leahy Scale: Good     Standing balance support: Bilateral upper extremity supported Standing balance-Leahy Scale: Poor Standing balance comment: requiring RW                             Pertinent Vitals/Pain Pain Assessment: No/denies pain    Home Living Family/patient expects to be discharged to:: Private residence Living Arrangements: Spouse/significant other Available Help at Discharge: Family;Available 24 hours/day Type of Home: Apartment Home Access: Stairs to enter Entrance Stairs-Rails: None Entrance Stairs-Number of Steps: 2 from parking area to apt door. pt receives assist from husband. Home  Layout: One level Home Equipment: Cane - single point;Walker - 2 wheels;Shower seat;Wheelchair - manual Additional Comments: doors in house too narrow for w/c    Prior Function Level of Independence: Needs assistance   Gait / Transfers Assistance Needed: uses SPC most often, reports she does use walker around house every once in a while, but doorways are too small  for RW. pt reports prefers RW; pt could ambulate in house using cane and furniture; had difficulty with steps  ADL's / Homemaking Assistance Needed: spouse assists with socks, shoes. Pt reports fall in shower ~3 years ago,scared of shower since then and uses bird bath.  Comments: husband does the driving, pt reports wearing depends at home; Reports 3 falls in 2 months     Hand Dominance   Dominant Hand: Right    Extremity/Trunk Assessment   Upper Extremity Assessment Upper Extremity Assessment: Generalized weakness    Lower Extremity Assessment Lower Extremity Assessment: LLE deficits/detail;RLE deficits/detail RLE Deficits / Details: ROM WFL; MMT: 4/5 knee limited by pain, 5/5 ankle, 4/5 hip LLE Deficits / Details: ROM WFL: 5/5 ankle, 5/5 knee; 4/5 hip    Cervical / Trunk Assessment Cervical / Trunk Assessment: Normal  Communication   Communication: No difficulties  Cognition Arousal/Alertness: Awake/alert Behavior During Therapy: WFL for tasks assessed/performed Overall Cognitive Status: Within Functional Limits for tasks assessed                                        General Comments  Dizziness Assessment:    Pt with history of chronic dizziness.  Reports that occurs when she is sitting and bends over to feet, up and walking, standing, and not sure about when she is in bed.  States "it is worse when I don't take my Metoprolol."  Describes as feels like she is moving when she is not, and reports pressure on her eyes.  Pt with low vision (blind R eye, legally blind L eye).  States symptoms usually last few seconds to minutes.   During PT , pt felt dizzy upon sitting, with standing, and resolved once reclined.  BP's were elevated (155/79 sitting, 130/78 standing).  Did not get dizzy with head turns.  Did have dizziness with EOEM but no nystagmus.  Pt reports feeling nauseated and not able to tolerate further tx.     Exercises     Assessment/Plan    PT  Assessment Patient needs continued PT services  PT Problem List Decreased strength;Decreased mobility;Decreased safety awareness;Decreased range of motion;Decreased activity tolerance;Cardiopulmonary status limiting activity;Decreased balance;Decreased knowledge of use of DME;Pain       PT Treatment Interventions DME instruction;Therapeutic activities;Gait training;Therapeutic exercise;Patient/family education;Balance training;Functional mobility training    PT Goals (Current goals can be found in the Care Plan section)  Acute Rehab PT Goals Patient Stated Goal: return home PT Goal Formulation: With patient Time For Goal Achievement: 12/15/20 Potential to Achieve Goals: Good    Frequency Min 3X/week   Barriers to discharge        Co-evaluation               AM-PAC PT "6 Clicks" Mobility  Outcome Measure Help needed turning from your back to your side while in a flat bed without using bedrails?: A Little Help needed moving from lying on your back to sitting on the side of a flat bed without using bedrails?: A Little Help needed  moving to and from a bed to a chair (including a wheelchair)?: A Little Help needed standing up from a chair using your arms (e.g., wheelchair or bedside chair)?: A Little Help needed to walk in hospital room?: A Little Help needed climbing 3-5 steps with a railing? : A Lot 6 Click Score: 17    End of Session Equipment Utilized During Treatment: Gait belt Activity Tolerance: Patient tolerated treatment well Patient left: with chair alarm set;in chair;with call bell/phone within reach Nurse Communication: Mobility status PT Visit Diagnosis: Other abnormalities of gait and mobility (R26.89);Muscle weakness (generalized) (M62.81);Dizziness and giddiness (R42)    Time: 7948-0165 PT Time Calculation (min) (ACUTE ONLY): 35 min   Charges:   PT Evaluation $PT Eval Moderate Complexity: 1 Mod PT Treatments $Therapeutic Activity: 8-22 mins         Abran Richard, PT Acute Rehab Services Pager [phone] [facility] [phone]    Karlton Lemon 12/01/2020, 11:53 AM

## 2020-12-01 NOTE — Consult Note (Addendum)
GYN Oncology Consultation  [name] 65 y.o. female  CC:  Chief Complaint  Patient presents with  . Back Pain    HPI: [name] is a 65 year old female who presented to the ER on 11/30/2020 for lower back/abdominal pain, weakness, and nausea. Further evaluation in the ER included lab work resulting WBC 13.5 ([place] 11.4), creatinine of 1.14, glucose of 308, and UA with >=500 mg/dL of glucose and large amount of hemoglobin. A CT scan of the abdomen and pelvis was performed revealing a complex cystic left ovarian mass measuring 5.3 x 2.6 x 6.1 cm and moderate left hydroureteronephrosis. An ultrasound was recommended and performed resulting a thickened endometrium of 9 mm and the complex cystic left ovarian mass.   The patient has been under the care of Dr. Darron Doom, gynecologist, since 2019. She has ultrasounds in Epic dating back to 2010 for evaluation of abnormal uterine bleeding. Her last procedure for PMB was on 10/11/2018, where she underwent a dilation and curettage of the uterus with hysteroscopy and Myosure sampling along with Liletta IUD placement. Pathology at this time revealed simple and complex hyperplasia with focal atypia. Recommendation was for resampling in 3 months. An endometrial biopsy taken in the office on 03/28/2019 was negative without abundant tissue in the sample. The plan after this was for repeat US in 3 months. A telehealth visit was performed on 08/23/2019 and 09/10/2019. Her medical history includes CHF, CKD Stage III, Type 2 Diabetes, HTN, hyperlipidemia, PSVT.    Interval History: She states she is doing well today. Reports mild lower abdominal cramping and states her back pain is under control at this time. Reports tolerating diet. No nausea reported at this time. She states she has had nausea for the past year on a daily basis. Since January 2022, she reports early satiety and reflux symptoms. She reports constipation prior to admission and states she currently has  not had a BM in 3 days. No flatus reported. At home, she was using prunes to assist with constipation. She has felt that her abdomen has been more swollen "on the sides" for the past 6 months and denies unintentional weight loss or gain. She reports having an episode of vaginal spotting when she came to the ED but denies having spotting before that time. For the past 6 months, she states she has not been able to urinate while sitting on the toilet but she can get urine out if she stands up or lays back. If she is able to get some urine to come out when sitting, it "stings a little."  She lives at home with her husband and has no children. She is sedentary during the day, stating she is unable to wash dishes/do chores because she cannot stand for long periods of time. Also states she does not go outside because her stairs are concrete and she does not have the stamina. She walks with a cane and reports generalized weakness for the past 6 months. No new lower extremity edema and reports left ankle swelling "for as long as she can remember." Denies neuropathy symptoms. She states she does not check her blood sugar at home and she knows she "needs to do better." She states when she has chest pain, she takes a nitroglycerin tablet and it makes her go to sleep but the pain is gone. When asking about this further, she states she doesn't have chest pain prior to admission.  Review of Systems Constitutional: Feels well today. No  fever, chills. +for early satiety. No change in appetite.  Cardiovascular: No chest pain, shortness of breath, or new edema.  Pulmonary: No cough or wheeze.  Gastrointestinal: + for nausea for the past year. +vomiting upon admission, or diarrhea. No bright red blood per rectum. +for constipation. Genitourinary: No frequency, urgency. + dysuria when urinating in the sitting position. +Vaginal spotting upon admission.  Musculoskeletal: No new myalgia or joint pain. Neurologic: +weakness. No  numbness, or change in gait.  Psychology: No depression, anxiety, or insomnia  Health Maintenance: Mammogram: Normal on 07/24/2020 Pap Smear: Negative with HPV high risk not detected 07/10/2018 Colonoscopy: With Dr. Fuller Plan 02/13/2018-one 5 mm polyp in the cecum and three 7 to 78mm polyps in the descending and ascending colon removed. Path showing tubular adenomas. Echocardiogram in 02/16/2016- EF 55-60% Last Hgb A1C on 01/25/2020 at 10.1   Current Meds: Current Inpt meds reviewed  Allergy:  Allergies  Allergen Reactions  . Ativan [Lorazepam] Anaphylaxis  . Orange Fruit [Citrus] Anaphylaxis and Other (See Comments)    Pt stated throat swelling, itching    Social Hx:   Social History   Socioeconomic History  . Marital status: Married    Spouse name: Not on file  . Number of children: 0  . Years of education: Not on file  . Highest education level: Not on file  Occupational History  . Occupation: disabled  Tobacco Use  . Smoking status: Never Smoker  . Smokeless tobacco: Never Used  Vaping Use  . Vaping Use: Never used  Substance and Sexual Activity  . Alcohol use: No  . Drug use: Never  . Sexual activity: Yes    Birth control/protection: None    Comment: Married  Other Topics Concern  . Not on file  Social History Narrative  . Not on file   Social Determinants of Health   Financial Resource Strain: Not on file  Food Insecurity: Not on file  Transportation Needs: Not on file  Physical Activity: Not on file  Stress: Not on file  Social Connections: Not on file  Intimate Partner Violence: Not on file    Past Surgical Hx:  Past Surgical History:  Procedure Laterality Date  . BIOPSY  02/13/2018   Procedure: BIOPSY;  Surgeon: Ladene Artist, MD;  Location: [facility];  Service: Endoscopy;;  . BREAST SURGERY Right 2019    breast biopsy,  per pt benign  . CARDIAC CATHETERIZATION  02-27-2009   dr al little   diffuse 3V CAD , involving RCA, CFx, and D1;  inferior  wall abnormalities, low normal ef 50%  . CATARACT EXTRACTION Right   . CATARACT EXTRACTION W/ INTRAOCULAR LENS IMPLANT Left 10-11-2007   @[facility]   . COLONOSCOPY WITH PROPOFOL N/A 02/13/2018   Procedure: COLONOSCOPY WITH PROPOFOL;  Surgeon: Ladene Artist, MD;  Location: [facility];  Service: Endoscopy;  Laterality: N/A;  . DILATION AND CURETTAGE OF UTERUS  x2 last one 2000  . EYE SURGERY Bilateral 2009 to 2010   x2  left eye;  x2  right eye   . HYSTEROSCOPY WITH D & C N/A 10/11/2018   Procedure: DILATATION AND CURETTAGE /HYSTEROSCOPY;  Surgeon: [name], MD;  Location: [facility];  Service: Gynecology;  Laterality: N/A;  . INCISION AND DRAINAGE PERIRECTAL ABSCESS  04/03/2012  . INTRAUTERINE DEVICE (IUD) INSERTION  10/11/2018   Procedure: INTRAUTERINE DEVICE (IUD) INSERTION;  Surgeon: [name], MD;  Location: [facility];  Service: Gynecology;;  . IR US GUIDE VASC ACCESS LEFT  02/13/2018  . IR VENIPUNCTURE 44YRS/OLDER BY MD  02/13/2018  . POLYPECTOMY  02/13/2018   Procedure: POLYPECTOMY;  Surgeon: Ladene Artist, MD;  Location: [facility];  Service: Endoscopy;;    Past Medical Hx:  Past Medical History:  Diagnosis Date  . Blind right eye   . Chronic back pain    "my whole back" (03/18/2015)  . Chronic chest pain    takes isosorbide  . Chronic diastolic heart failure ([place]) 9/13   echo 03/20/15 LV Ef of 60-65%, grade 2DD and PA peak pressure: 36 mm Hg   . CKD (chronic kidney disease), stage III ([place])   . Coronary artery disease    Dr Sallyanne Kuster, cardiac cath 02-07-2009 -- diffuse 3V CAD involving RCA, CFx, and D1,  pt not a candidate for bypass surgery or angioplasty,  medical management  . Diabetic neuropathy ([place])   . Diabetic retinopathy ([place])    bilateral proliferative  . Diastolic CHF, chronic ([place])   . Dyspnea    "always"  . Edema of both lower extremities   . Generalized weakness   . GERD (gastroesophageal reflux disease)   . Glaucoma, both eyes   . History of non-ST elevation  myocardial infarction (NSTEMI)    02-25-2009;  05-15-2009;  01-08-2010;  04-04-2012 this MI was post-op surgery for abscess on 04-03-2012  . History of sepsis   . Hyperlipemia   . Hypertension   . Insulin dependent type 2 diabetes mellitus ([place])    followed by pcp  . Iron deficiency anemia due to chronic blood loss    uterine bleeding  . Legally blind in left eye, as defined in [place]    per pt states vision "is foggy"  . Mild obstructive sleep apnea    study in epic 04-19-2012 mild osa,  recommendation's were wt. loss, dental  appliance, surgery, or cpap  . Morbid obesity with BMI of 40.0-44.9, adult ([place])   . OA (osteoarthritis)    knees  . PSVT (paroxysmal supraventricular tachycardia) ([place])   . Renal insufficiency    pt. denies  . Seasonal allergies   . Wears glasses     Family Hx:  Family History  Problem Relation Age of Onset  . Diabetes Mother   . Heart disease Mother   . Heart attack Mother   . Hypertension Mother   . Breast cancer Mother   . Colon polyps Father   . Diabetes Father   . Heart disease Father   . Heart attack Father   . Stroke Father   . Hypertension Father   . Diabetes Brother   . Diabetes Sister   . Heart disease Brother   . Heart disease Sister   . Diabetes Maternal Grandmother   . Hypertension Maternal Grandmother   . Hypertension Brother   . Hypertension Sister   . Breast cancer Cousin   . Colon cancer Cousin   . Esophageal cancer Neg Hx   . Rectal cancer Neg Hx   . Stomach cancer Neg Hx     Vitals:  Blood pressure (!) 166/64, pulse 87, temperature 98.4 F (36.9 C), temperature source Oral, resp. rate 18, height 5\' 3"  (1.6 m), weight 280 lb (127 kg), last menstrual period 04/02/2012, SpO2 97 %.  Recent labs reviewed  Physical Exam:  Alert, oriented, in no acute distress, sitting in the chair Lungs clear without wheezing or rales. Heart rate regular in rate and rhythm. Abdomen morbidly obese. Soft with active bowel sounds.  Pelvic  examination to  be performed when patient returns to the office for evaluation outpatient Left lower extrem ankle edema noted, non pitting. Pure wick in place with concentrated yellow urine in the canister. SCDs on.  Assessment/Plan: 65 year old female currently admitted with complex cystic left ovarian mass and moderate hydroureteronephrosis on the left with history of complex atypical endometrial hyperplasia diagnosed in 10/2018. CA 125 is pending.   Dr. Denman George to see patient later today to discuss recommendations moving forward. GYN ONC will continue to follow and will arrange for an appointment in the office once discharged for pelvic examination/further evaluation.    Dorothyann Gibbs, NP 12/01/2020, 1:13 PM

## 2020-12-01 NOTE — Progress Notes (Signed)
PROGRESS NOTE    [name]  HBZ:[id number] DOB: [birth year] DOA: 11/30/2020 PCP: Ladell Pier, MD    Brief Narrative: This 65 years old female with PMH significant for chronic iron deficiency anemia, type 2 diabetes, essential hypertension, chronic diastolic CHF,  CKD stage IIIa with a baseline creatinine of 1.2-1.3, chronic back pain presenting the ED with generalized weakness, lower abdominal pain associated with nausea and vomiting.  Patient also reported intermittent vaginal bleeding for last 1 week. CT abdomen and pelvis showed no acute abdominal or pelvic pathology but it showed complex cystic left ovarian mass. Pelvic ultrasound confirms the same.  ED physician has spoken with OB/GYN who recommended checking CA125 level,  outpatient oncology and OB/GYN follow-up.  Assessment & Plan:   Principal Problem:   Generalized weakness Active Problems:   Essential hypertension   Prolonged Q-T interval on ECG   Controlled type 2 diabetes mellitus with complication, with long-term current use of insulin (HCC)   Nausea & vomiting   Ovarian mass, left   Hypokalemia   Leukocytosis   Chronic diastolic heart failure (HCC)   Generalized weakness:  This could be due to dehydration in the context of recent nausea and vomiting and inability to take p.o..   Denies any focal neurological deficits. No evidence of any infectious process. UA : No infection, CXR : No acute abnormality  Continue IV hydration, PT and OT evaluation  Nausea and vomiting: Continue Zofran as needed for nausea and vomiting. CT abdomen and pelvis did not show any acute abdominal/ pelvic pathology.  Prolonged QTc interval: Repeat EKG shows normal QTC,   magnesium and other electrolytes normal.  Left ovarian mass: She is found to have left ovarian mass on CT abdomen and pelvis as well as pelvic ultrasound. Case was discussed with on call OB/GYN on-call and oncologist recommended outpatient oncology clinic and  GYN follow-up.  Type 2 diabetes: Moderate dose sliding scale,  hold oral hypoglycemic agents.  Essential hypertension: Continue Toprol, Imdur and losartan.  Chronic diastolic CHF: Recent echocardiogram in June 21,  LVEF 65 to 70% with no evidence of focal wall motion abnormalities.   Continue Lasix 120 mg p.o. twice daily.  Patient does not seem to have exacerbation.  CKD stage IIIb :  Avoid nephrotoxic medications,  serum creatinine at baseline.   DVT prophylaxis:  Heparin Code Status: Full code. Family Communication: No family at bed side. Disposition Plan:  Status is: Observation  The patient remains OBS appropriate and will d/c before 2 midnights.  Dispo: The patient is from: Home              Anticipated d/c is to: Home              Patient currently is not medically stable to d/c.   Difficult to place patient No   Consultants:    None  Procedures:  CT A/P, pelvic ultrasound. Antimicrobials:   Anti-infectives (From admission, onward)   None      Subjective: Patient was seen and examined at bedside.  Overnight events noted.   Patient reports feeling better but still feels very weak and tired.   She denies any further vaginal bleeding.  Objective: Vitals:   12/01/20 0030 12/01/20 0042 12/01/20 0143 12/01/20 0613  BP: (!) 154/70 (!) 152/83 (!) 171/80 (!) 166/64  Pulse: 81 84 78 87  Resp: 16 14 20 18   Temp:  (!) 97.5 F (36.4 C) 97.8 F (36.6 C) 98.4 F (36.9 C)  TempSrc:  Oral Oral Oral  SpO2: 100% 100% 100% 97%  Weight:      Height:        Intake/Output Summary (Last 24 hours) at 12/01/2020 1545 Last data filed at 12/01/2020 0933 Gross per 24 hour  Intake 221.1 ml  Output 1500 ml  Net -1278.9 ml   Filed Weights   11/30/20 1143  Weight: 127 kg    Examination:  General exam: Appears calm and comfortable, not in any acute distress. Respiratory system: Clear to auscultation. Respiratory effort normal. Cardiovascular system: S1 & S2 heard, RRR.  No JVD, murmurs, rubs, gallops or clicks. No pedal edema. Gastrointestinal system: Abdomen is nondistended, soft and nontender. No organomegaly or masses felt. Normal bowel sounds heard. Central nervous system: Alert and oriented. No focal neurological deficits. Extremities: Symmetric 5 x 5 power.  No edema, no cyanosis, no clubbing. Skin: No rashes, lesions or ulcers Psychiatry: Judgement and insight appear normal. Mood & affect appropriate.     Data Reviewed: I have personally reviewed following labs and imaging studies  CBC: Recent Labs  Lab 11/30/20 1420 11/30/20 1438 12/01/20 0510  WBC 13.5*  --  13.6*  NEUTROABS 11.4*  --  10.4*  HGB 13.3 14.3 12.7  HCT 40.0 42.0 39.1  MCV 93.2  --  94.7  PLT 245  --  099   Basic Metabolic Panel: Recent Labs  Lab 11/30/20 1420 11/30/20 1438 12/01/20 0510  NA 136 137 141  K 3.5 3.4* 4.0  CL 101 100 104  CO2 22  --  26  GLUCOSE 308* 320* 344*  BUN 16 15 18   CREATININE 1.14* 1.00 0.87  CALCIUM 8.9  --  9.1  MG 2.0  --  2.1   GFR: Estimated Creatinine Clearance: 82.5 mL/min (by C-G formula based on SCr of 0.87 mg/dL). Liver Function Tests: Recent Labs  Lab 11/30/20 1420 12/01/20 0510  AST 18 16  ALT 12 14  ALKPHOS 56 54  BILITOT 0.9 0.4  PROT 8.0 7.4  ALBUMIN 3.5 3.4*   No results for input(s): LIPASE, AMYLASE in the last 168 hours. No results for input(s): AMMONIA in the last 168 hours. Coagulation Profile: Recent Labs  Lab 12/01/20 0510  INR 1.2   Cardiac Enzymes: No results for input(s): CKTOTAL, CKMB, CKMBINDEX, TROPONINI in the last 168 hours. BNP (last 3 results) No results for input(s): PROBNP in the last 8760 hours. HbA1C: No results for input(s): HGBA1C in the last 72 hours. CBG: Recent Labs  Lab 11/30/20 1837 11/30/20 1915 11/30/20 2344 12/01/20 0823 12/01/20 1135  GLUCAP 408* 375* 342* 314* 309*   Lipid Profile: No results for input(s): CHOL, HDL, LDLCALC, TRIG, CHOLHDL, LDLDIRECT in the last  72 hours. Thyroid Function Tests: Recent Labs    12/01/20 0510  TSH 0.243*   Anemia Panel: No results for input(s): VITAMINB12, FOLATE, FERRITIN, TIBC, IRON, RETICCTPCT in the last 72 hours. Sepsis Labs: No results for input(s): PROCALCITON, LATICACIDVEN in the last 168 hours.  Recent Results (from the past 240 hour(s))  SARS CORONAVIRUS 2 (TAT 6-24 HRS) Nasopharyngeal Nasopharyngeal Swab     Status: None   Collection Time: 11/30/20 10:11 PM   Specimen: Nasopharyngeal Swab  Result Value Ref Range Status   SARS Coronavirus 2 NEGATIVE NEGATIVE Final    Comment: (NOTE) SARS-CoV-2 target nucleic acids are NOT DETECTED.  The SARS-CoV-2 RNA is generally detectable in upper and lower respiratory specimens during the acute phase of infection. Negative results do not preclude SARS-CoV-2 infection, do not  rule out co-infections with other pathogens, and should not be used as the sole basis for treatment or other patient management decisions. Negative results must be combined with clinical observations, patient history, and epidemiological information. The expected result is Negative.  Fact Sheet for Patients: SugarRoll.be  Fact Sheet for Healthcare Providers: https://www.woods-mathews.com/  This test is not yet approved or cleared by the [place] FDA and  has been authorized for detection and/or diagnosis of SARS-CoV-2 by FDA under an Emergency Use Authorization (EUA). This EUA will remain  in effect (meaning this test can be used) for the duration of the COVID-19 declaration under Se ction 564(b)(1) of the Act, 21 U.S.C. section 360bbb-3(b)(1), unless the authorization is terminated or revoked sooner.  Performed at [facility], [place] [address], [place], [place] [address]    Radiology Studies: CT ABDOMEN PELVIS W CONTRAST  Result Date: 11/30/2020 CLINICAL DATA:  Abdominal pain.  Vaginal bleeding. EXAM: CT ABDOMEN AND PELVIS  WITH CONTRAST TECHNIQUE: Multidetector CT imaging of the abdomen and pelvis was performed using the standard protocol following bolus administration of intravenous contrast. CONTRAST:  144mL OMNIPAQUE IOHEXOL 300 MG/ML  SOLN COMPARISON:  01/15/2019 FINDINGS: Lower chest: No acute abnormality. Hepatobiliary: No focal liver abnormality is seen. No gallstones, gallbladder wall thickening, or biliary dilatation. Pancreas: Unremarkable. No pancreatic ductal dilatation or surrounding inflammatory changes. Spleen: Normal in size without focal abnormality. Adrenals/Urinary Tract: Normal adrenal glands. Normal right kidney. No renal mass. Moderate left hydroureteronephrosis to the level of the left adnexa. Normal bladder. Stomach/Bowel: Stomach is within normal limits. No evidence of bowel wall thickening, distention, or inflammatory changes. Vascular/Lymphatic: Normal caliber abdominal aorta with mild atherosclerosis. No lymphadenopathy. Reproductive: Small dystrophic calcifications within the uterus likely reflecting small fibroids. Enlarged left ovary with a complex cystic left ovarian mass measuring 5.3 x 2.6 x 6.1 cm with a few internal septations incompletely characterized on this exam. Other: No ascites.  Small fat containing umbilical hernia. Musculoskeletal: No acute osseous abnormality. No aggressive osseous lesion. IMPRESSION: 1. No acute abdominal or pelvic pathology. 2. Moderate left hydroureteronephrosis to the level of the left adnexal mass without an obstructing lesion. 3. Complex cystic left ovarian mass measuring 5.3 x 2.6 x 6.1 cm. Recommend further characterization with a pelvic ultrasound. Electronically Signed   By: Kathreen Devoid   On: 11/30/2020 15:51   US PELVIC COMPLETE W TRANSVAGINAL AND TORSION R/O  Result Date: 11/30/2020 CLINICAL DATA:  Cystic mass in the left ovary. EXAM: TRANSABDOMINAL AND TRANSVAGINAL ULTRASOUND OF PELVIS DOPPLER ULTRASOUND OF OVARIES TECHNIQUE: Both transabdominal and  transvaginal ultrasound examinations of the pelvis were performed. Transabdominal technique was performed for global imaging of the pelvis including uterus, ovaries, adnexal regions, and pelvic cul-de-sac. It was necessary to proceed with endovaginal exam following the transabdominal exam to visualize the ovaries. Color and duplex Doppler ultrasound was utilized to evaluate blood flow to the ovaries. COMPARISON:  CT earlier in the same day FINDINGS: Uterus Measurements: 9.9 x 5.4 x 7 cm = volume: 196 mL. There is a 3 cm fibroid Endometrium Thickness: 9 mm.  No focal abnormality visualized. Right ovary Not visualized Left ovary Measurements: 7.9 x 5.6 x 6.2 cm = volume: 143 mL. There is a complex cystic mass measuring 5.6 x 5.5 x 5.8 cm. This mass demonstrates thick internal septations with possible areas of color Doppler flow involving the septations. Pulsed Doppler evaluation of the left ovary demonstrates normal arterial and venous waveforms. The right ovary was not adequately visualized. Other  findings No abnormal free fluid. IMPRESSION: 1. Again noted is a complex cystic mass involving the left adnexa. Ovarian malignancy is not excluded. Gynecologic follow-up is recommended. A nonemergent contrast enhanced MRI of the pelvis may be useful for further characterization. 2. Nonvisualization of the right ovary. 3. Thickened endometrium measuring 9 mm. Endometrial thickness is considered abnormal for an asymptomatic post-menopausal female. Endometrial sampling should be considered to exclude carcinoma. Electronically Signed   By: Constance Holster M.D.   On: 11/30/2020 17:56   Scheduled Meds: . acetaZOLAMIDE  500 mg Oral BID  . atorvastatin  80 mg Oral QPM  . brimonidine  1 drop Both Eyes BID   And  . timolol  1 drop Both Eyes BID  . diltiazem  240 mg Oral Daily  . insulin aspart  0-15 Units Subcutaneous TID WC  . insulin aspart  0-5 Units Subcutaneous QHS  . insulin detemir  7 Units Subcutaneous BID  .  isosorbide mononitrate  120 mg Oral Daily  . latanoprost  1 drop Right Eye QHS  . losartan  100 mg Oral Daily  . metoprolol  200 mg Oral Daily  . pantoprazole  40 mg Oral Daily   Continuous Infusions:   LOS: 0 days    Time spent: 35 mins    Francies Inch, MD [facility]   If 7PM-7AM, please contact night-coverage

## 2020-12-02 DIAGNOSIS — I5032 Chronic diastolic (congestive) heart failure: Secondary | ICD-10-CM | POA: Diagnosis present

## 2020-12-02 DIAGNOSIS — Z6841 Body Mass Index (BMI) 40.0 and over, adult: Secondary | ICD-10-CM | POA: Diagnosis not present

## 2020-12-02 DIAGNOSIS — N8502 Endometrial intraepithelial neoplasia [EIN]: Secondary | ICD-10-CM | POA: Diagnosis not present

## 2020-12-02 DIAGNOSIS — Z794 Long term (current) use of insulin: Secondary | ICD-10-CM | POA: Diagnosis not present

## 2020-12-02 DIAGNOSIS — D398 Neoplasm of uncertain behavior of other specified female genital organs: Secondary | ICD-10-CM

## 2020-12-02 DIAGNOSIS — D500 Iron deficiency anemia secondary to blood loss (chronic): Secondary | ICD-10-CM | POA: Diagnosis present

## 2020-12-02 DIAGNOSIS — E860 Dehydration: Secondary | ICD-10-CM | POA: Diagnosis present

## 2020-12-02 DIAGNOSIS — I130 Hypertensive heart and chronic kidney disease with heart failure and stage 1 through stage 4 chronic kidney disease, or unspecified chronic kidney disease: Secondary | ICD-10-CM | POA: Diagnosis present

## 2020-12-02 DIAGNOSIS — G8929 Other chronic pain: Secondary | ICD-10-CM | POA: Diagnosis present

## 2020-12-02 DIAGNOSIS — E876 Hypokalemia: Secondary | ICD-10-CM | POA: Diagnosis present

## 2020-12-02 DIAGNOSIS — N1330 Unspecified hydronephrosis: Secondary | ICD-10-CM | POA: Diagnosis present

## 2020-12-02 DIAGNOSIS — E872 Acidosis: Secondary | ICD-10-CM | POA: Diagnosis present

## 2020-12-02 DIAGNOSIS — Z20822 Contact with and (suspected) exposure to covid-19: Secondary | ICD-10-CM | POA: Diagnosis present

## 2020-12-02 DIAGNOSIS — E1165 Type 2 diabetes mellitus with hyperglycemia: Secondary | ICD-10-CM | POA: Diagnosis present

## 2020-12-02 DIAGNOSIS — N83202 Unspecified ovarian cyst, left side: Secondary | ICD-10-CM | POA: Diagnosis present

## 2020-12-02 DIAGNOSIS — N950 Postmenopausal bleeding: Secondary | ICD-10-CM | POA: Diagnosis not present

## 2020-12-02 DIAGNOSIS — R1030 Lower abdominal pain, unspecified: Secondary | ICD-10-CM | POA: Diagnosis present

## 2020-12-02 DIAGNOSIS — R627 Adult failure to thrive: Secondary | ICD-10-CM | POA: Diagnosis present

## 2020-12-02 DIAGNOSIS — M545 Low back pain, unspecified: Secondary | ICD-10-CM | POA: Diagnosis present

## 2020-12-02 DIAGNOSIS — E1140 Type 2 diabetes mellitus with diabetic neuropathy, unspecified: Secondary | ICD-10-CM | POA: Diagnosis present

## 2020-12-02 DIAGNOSIS — R531 Weakness: Secondary | ICD-10-CM | POA: Diagnosis not present

## 2020-12-02 DIAGNOSIS — D72829 Elevated white blood cell count, unspecified: Secondary | ICD-10-CM | POA: Diagnosis present

## 2020-12-02 DIAGNOSIS — N1832 Chronic kidney disease, stage 3b: Secondary | ICD-10-CM | POA: Diagnosis present

## 2020-12-02 DIAGNOSIS — N179 Acute kidney failure, unspecified: Secondary | ICD-10-CM | POA: Diagnosis present

## 2020-12-02 DIAGNOSIS — I471 Supraventricular tachycardia: Secondary | ICD-10-CM | POA: Diagnosis present

## 2020-12-02 DIAGNOSIS — R112 Nausea with vomiting, unspecified: Secondary | ICD-10-CM | POA: Diagnosis present

## 2020-12-02 LAB — BASIC METABOLIC PANEL
Anion gap: 8 (ref 5–15)
BUN: 17 mg/dL (ref 8–23)
CO2: 23 mmol/L (ref 22–32)
Calcium: 8.6 mg/dL — ABNORMAL LOW (ref 8.9–10.3)
Chloride: 107 mmol/L (ref 98–111)
Creatinine, Ser: 1.22 mg/dL — ABNORMAL HIGH (ref 0.44–1.00)
GFR, Estimated: 49 mL/min — ABNORMAL LOW (ref >60)
Glucose, Bld: 156 mg/dL — ABNORMAL HIGH (ref 70–99)
Potassium: 3.5 mmol/L (ref 3.5–5.1)
Sodium: 138 mmol/L (ref 135–145)

## 2020-12-02 LAB — MAGNESIUM: Magnesium: 2.2 mg/dL (ref 1.7–2.4)

## 2020-12-02 LAB — CBC
HCT: 36.5 % (ref 36.0–46.0)
Hemoglobin: 11.6 g/dL — ABNORMAL LOW (ref 12.0–15.0)
MCH: 31.1 pg (ref 26.0–34.0)
MCHC: 31.8 g/dL (ref 30.0–36.0)
MCV: 97.9 fL (ref 80.0–100.0)
Platelets: 203 10*3/uL (ref 150–400)
RBC: 3.73 MIL/uL — ABNORMAL LOW (ref 3.87–5.11)
RDW: 12.5 % (ref 11.5–15.5)
WBC: 9.7 10*3/uL (ref 4.0–10.5)
nRBC: 0 % (ref 0.0–0.2)

## 2020-12-02 LAB — HEMOGLOBIN A1C
Hgb A1c MFr Bld: 8.3 % — ABNORMAL HIGH (ref 4.8–5.6)
Mean Plasma Glucose: 191.51 mg/dL

## 2020-12-02 LAB — GLUCOSE, CAPILLARY
Glucose-Capillary: 187 mg/dL — ABNORMAL HIGH (ref 70–99)
Glucose-Capillary: 405 mg/dL — ABNORMAL HIGH (ref 70–99)
Glucose-Capillary: 305 mg/dL — ABNORMAL HIGH (ref 70–99)
Glucose-Capillary: 205 mg/dL — ABNORMAL HIGH (ref 70–99)

## 2020-12-02 LAB — PHOSPHORUS: Phosphorus: 3.1 mg/dL (ref 2.5–4.6)

## 2020-12-02 LAB — CA 125
Cancer Antigen (CA) 125: 236 U/mL — ABNORMAL HIGH (ref 0.0–38.1)
Cancer Antigen (CA) 125: 234 U/mL — ABNORMAL HIGH (ref 0.0–38.1)

## 2020-12-02 LAB — CALCIUM, IONIZED: Calcium, Ionized, Serum: 4.7 mg/dL (ref 4.5–5.6)

## 2020-12-02 LAB — GLUCOSE, RANDOM: Glucose, Bld: 462 mg/dL — ABNORMAL HIGH (ref 70–99)

## 2020-12-02 MED ORDER — INSULIN ASPART PROT & ASPART (70-30 MIX) 100 UNIT/ML ~~LOC~~ SUSP
28.0000 [IU] | Freq: Two times a day (BID) | SUBCUTANEOUS | Status: DC
  Administered 2020-12-02 – 2020-12-10 (×14): 28 [IU] via SUBCUTANEOUS
  Filled 2020-12-02: qty 10

## 2020-12-02 MED ORDER — SODIUM CHLORIDE 0.9 % IV SOLN: INTRAVENOUS | Status: DC

## 2020-12-02 MED ORDER — INSULIN ASPART 100 UNIT/ML IJ SOLN
5.0000 [IU] | Freq: Once | INTRAMUSCULAR | Status: AC
  Administered 2020-12-02: 5 [IU] via SUBCUTANEOUS

## 2020-12-02 MED ORDER — INSULIN DETEMIR 100 UNIT/ML ~~LOC~~ SOLN: 10.0000 [IU] | Freq: Two times a day (BID) | SUBCUTANEOUS | Status: DC

## 2020-12-02 NOTE — Progress Notes (Signed)
PROGRESS NOTE    [name]  HYI:[id number] DOB: 1956/03/07 DOA: 11/30/2020 PCP: Ladell Pier, MD    Brief Narrative: This 65 years old female with PMH significant for chronic iron deficiency anemia, type 2 diabetes, essential hypertension, chronic diastolic CHF,  CKD stage IIIa with a baseline creatinine of 1.2-1.3, chronic back pain presenting the ED with generalized weakness, lower abdominal pain associated with nausea and vomiting.  Patient also reported intermittent vaginal bleeding for last 1 week. CT abdomen and pelvis showed no acute abdominal or pelvic pathology but it showed complex cystic left ovarian mass. Pelvic ultrasound confirms the same.  ED physician has spoken with OB/GYN who recommended checking CA125 level,  outpatient oncology and OB/GYN follow-up.  Assessment & Plan:   Principal Problem:   Generalized weakness Active Problems:   Essential hypertension   Prolonged Q-T interval on ECG   Controlled type 2 diabetes mellitus with complication, with long-term current use of insulin (HCC)   Nausea & vomiting   Ovarian mass, left   Hypokalemia   Leukocytosis   Chronic diastolic heart failure (HCC)   Generalized weakness:  This could be due to dehydration in the context of recent nausea and vomiting and inability to take p.o..   Denies any focal neurological deficits. No evidence of any infectious process. UA : No infection, CXR : No acute abnormality  Continue IV hydration, PT and OT evaluation Patient has very low p.o. intake,  we will continue IV hydration.  Nausea and vomiting: Continue Zofran as needed for nausea and vomiting. CT abdomen and pelvis did not show any acute abdominal/ pelvic pathology.  Moderate left hydroureteronephrosis to the level of the left adnexal mass without an obstructing lesion.  Prolonged QTc interval: Repeat EKG shows normal QTC,   magnesium and other electrolytes normal.  Left ovarian mass: She is found to have left  ovarian mass on CT abdomen and pelvis as well as pelvic ultrasound. Case was discussed with on call OB/GYN on-call and oncologist recommended outpatient oncology clinic and GYN follow-up.   Type 2 diabetes:  Moderate dose sliding scale,  hold oral hypoglycemic agents.   Start NovoLog 70/30 mix  28 units twice daily.  Essential hypertension: Continue Toprol, Imdur and losartan.  Chronic diastolic CHF: Recent echocardiogram in June 21,  LVEF 65 to 70% with no evidence of focal wall motion abnormalities.   Continue Lasix 120 mg p.o. twice daily.  Patient does not seem to have exacerbation.  CKD stage IIIb :   Serum creatinine slightly up today to 1.22. Avoid nephrotoxic medications,  serum creatinine at baseline. Moderate left hydroureteronephrosis to the level of the left adnexal mass without an obstructing lesion. Discussed with urologist recommended outpatient follow-up.   DVT prophylaxis:  Heparin Code Status: Full code. Family Communication: No family at bed side. Disposition Plan:  Status is: Inpatient  Remains inpatient appropriate because:Inpatient level of care appropriate due to severity of illness   Dispo: The patient is from: Home              Anticipated d/c is to: Home on 5/4              Patient currently is not medically stable to d/c.   Difficult to place patient No   Consultants:    None  Procedures:  CT A/P, pelvic ultrasound. Antimicrobials:   Anti-infectives (From admission, onward)   None      Subjective: Patient was seen and examined at bedside.  Overnight events  noted.   Patient reports her blood sugar has been very high, at home she takes NovoLog 75/25  54 units twice daily. She also reports feeling weak and tired.  Objective: Vitals:   12/01/20 2157 12/02/20 0500 12/02/20 0522 12/02/20 1430  BP: 138/61  (!) 143/64 (!) 155/81  Pulse: 64  (!) 59 (!) 58  Resp: 17  16 20   Temp: 98.5 F (36.9 C)  98 F (36.7 C) 98.2 F (36.8 C)   TempSrc: Oral  Oral   SpO2: 97%  97% 100%  Weight:  129.7 kg    Height:        Intake/Output Summary (Last 24 hours) at 12/02/2020 1603 Last data filed at 12/02/2020 1207 Gross per 24 hour  Intake 881 ml  Output 1700 ml  Net -819 ml   Filed Weights   11/30/20 1143 12/02/20 0500  Weight: 127 kg 129.7 kg    Examination:  General exam: Appears calm and comfortable, not in any acute distress. Respiratory system: Clear to auscultation. Respiratory effort normal. Cardiovascular system: S1 & S2 heard, RRR. No JVD, murmurs, rubs, gallops or clicks. No pedal edema. Gastrointestinal system: Abdomen is nondistended, soft and nontender. No organomegaly or masses felt.  Normal bowel sounds heard. Central nervous system: Alert and oriented. No focal neurological deficits. Extremities: No edema, no cyanosis, no clubbing. Skin: No rashes, lesions or ulcers Psychiatry: Judgement and insight appear normal. Mood & affect appropriate.     Data Reviewed: I have personally reviewed following labs and imaging studies  CBC: Recent Labs  Lab 11/30/20 1420 11/30/20 1438 12/01/20 0510 12/02/20 0510  WBC 13.5*  --  13.6* 9.7  NEUTROABS 11.4*  --  10.4*  --   HGB 13.3 14.3 12.7 11.6*  HCT 40.0 42.0 39.1 36.5  MCV 93.2  --  94.7 97.9  PLT 245  --  252 161   Basic Metabolic Panel: Recent Labs  Lab 11/30/20 1420 11/30/20 1438 12/01/20 0510 12/02/20 0510 12/02/20 1228  NA 136 137 141 138  --   K 3.5 3.4* 4.0 3.5  --   CL 101 100 104 107  --   CO2 22  --  26 23  --   GLUCOSE 308* 320* 344* 156* 462*  BUN 16 15 18 17   --   CREATININE 1.14* 1.00 0.87 1.22*  --   CALCIUM 8.9  --  9.1 8.6*  --   MG 2.0  --  2.1 2.2  --   PHOS  --   --   --  3.1  --    GFR: Estimated Creatinine Clearance: 59.6 mL/min (A) (by C-G formula based on SCr of 1.22 mg/dL (H)). Liver Function Tests: Recent Labs  Lab 11/30/20 1420 12/01/20 0510  AST 18 16  ALT 12 14  ALKPHOS 56 54  BILITOT 0.9 0.4  PROT  8.0 7.4  ALBUMIN 3.5 3.4*   No results for input(s): LIPASE, AMYLASE in the last 168 hours. No results for input(s): AMMONIA in the last 168 hours. Coagulation Profile: Recent Labs  Lab 12/01/20 0510  INR 1.2   Cardiac Enzymes: No results for input(s): CKTOTAL, CKMB, CKMBINDEX, TROPONINI in the last 168 hours. BNP (last 3 results) No results for input(s): PROBNP in the last 8760 hours. HbA1C: Recent Labs    12/02/20 0510  HGBA1C 8.3*   CBG: Recent Labs  Lab 12/01/20 1135 12/01/20 1805 12/01/20 2154 12/02/20 0754 12/02/20 1152  GLUCAP 309* 126* 187* 187* 405*   Lipid Profile:  No results for input(s): CHOL, HDL, LDLCALC, TRIG, CHOLHDL, LDLDIRECT in the last 72 hours. Thyroid Function Tests: Recent Labs    12/01/20 0510  TSH 0.243*   Anemia Panel: No results for input(s): VITAMINB12, FOLATE, FERRITIN, TIBC, IRON, RETICCTPCT in the last 72 hours. Sepsis Labs: No results for input(s): PROCALCITON, LATICACIDVEN in the last 168 hours.  Recent Results (from the past 240 hour(s))  SARS CORONAVIRUS 2 (TAT 6-24 HRS) Nasopharyngeal Nasopharyngeal Swab     Status: None   Collection Time: 11/30/20 10:11 PM   Specimen: Nasopharyngeal Swab  Result Value Ref Range Status   SARS Coronavirus 2 NEGATIVE NEGATIVE Final    Comment: (NOTE) SARS-CoV-2 target nucleic acids are NOT DETECTED.  The SARS-CoV-2 RNA is generally detectable in upper and lower respiratory specimens during the acute phase of infection. Negative results do not preclude SARS-CoV-2 infection, do not rule out co-infections with other pathogens, and should not be used as the sole basis for treatment or other patient management decisions. Negative results must be combined with clinical observations, patient history, and epidemiological information. The expected result is Negative.  Fact Sheet for Patients: SugarRoll.be  Fact Sheet for Healthcare  Providers: https://www.woods-mathews.com/  This test is not yet approved or cleared by the [place] FDA and  has been authorized for detection and/or diagnosis of SARS-CoV-2 by FDA under an Emergency Use Authorization (EUA). This EUA will remain  in effect (meaning this test can be used) for the duration of the COVID-19 declaration under Se ction 564(b)(1) of the Act, 21 U.S.C. section 360bbb-3(b)(1), unless the authorization is terminated or revoked sooner.  Performed at [facility], [place] [address], [place], [place] [address]    Radiology Studies: US PELVIC COMPLETE W TRANSVAGINAL AND TORSION R/O  Result Date: 11/30/2020 CLINICAL DATA:  Cystic mass in the left ovary. EXAM: TRANSABDOMINAL AND TRANSVAGINAL ULTRASOUND OF PELVIS DOPPLER ULTRASOUND OF OVARIES TECHNIQUE: Both transabdominal and transvaginal ultrasound examinations of the pelvis were performed. Transabdominal technique was performed for global imaging of the pelvis including uterus, ovaries, adnexal regions, and pelvic cul-de-sac. It was necessary to proceed with endovaginal exam following the transabdominal exam to visualize the ovaries. Color and duplex Doppler ultrasound was utilized to evaluate blood flow to the ovaries. COMPARISON:  CT earlier in the same day FINDINGS: Uterus Measurements: 9.9 x 5.4 x 7 cm = volume: 196 mL. There is a 3 cm fibroid Endometrium Thickness: 9 mm.  No focal abnormality visualized. Right ovary Not visualized Left ovary Measurements: 7.9 x 5.6 x 6.2 cm = volume: 143 mL. There is a complex cystic mass measuring 5.6 x 5.5 x 5.8 cm. This mass demonstrates thick internal septations with possible areas of color Doppler flow involving the septations. Pulsed Doppler evaluation of the left ovary demonstrates normal arterial and venous waveforms. The right ovary was not adequately visualized. Other findings No abnormal free fluid. IMPRESSION: 1. Again noted is a complex cystic mass involving  the left adnexa. Ovarian malignancy is not excluded. Gynecologic follow-up is recommended. A nonemergent contrast enhanced MRI of the pelvis may be useful for further characterization. 2. Nonvisualization of the right ovary. 3. Thickened endometrium measuring 9 mm. Endometrial thickness is considered abnormal for an asymptomatic post-menopausal female. Endometrial sampling should be considered to exclude carcinoma. Electronically Signed   By: Constance Holster M.D.   On: 11/30/2020 17:56   Scheduled Meds: . acetaZOLAMIDE  500 mg Oral BID  . atorvastatin  80 mg Oral QPM  . brimonidine  1  drop Both Eyes BID   And  . timolol  1 drop Both Eyes BID  . diltiazem  240 mg Oral Daily  . furosemide  120 mg Oral BID  . insulin aspart  0-15 Units Subcutaneous TID WC  . insulin aspart  0-5 Units Subcutaneous QHS  . insulin aspart protamine- aspart  28 Units Subcutaneous BID WC  . isosorbide mononitrate  120 mg Oral Daily  . latanoprost  1 drop Right Eye QHS  . losartan  100 mg Oral Daily  . metoprolol  200 mg Oral Daily  . pantoprazole  40 mg Oral Daily   Continuous Infusions: . sodium chloride 75 mL/hr at 12/02/20 1405     LOS: 0 days    Time spent: 25 mins    Naiah Donahoe, MD [facility]   If 7PM-7AM, please contact night-coverage

## 2020-12-02 NOTE — Progress Notes (Addendum)
Physical Therapy Treatment Patient Details Name: [name] MRN: [id number] DOB: 04-Sep-1955 Today's Date: 12/02/2020    History of Present Illness Pt is 65 yo female who presented on 11/30/20 with generalized weakness and c/o N/V.  Per MD weakness likely related to N/V and unable to tolerate P.O. intake and hypokalemia.  Of note, pt also with new L overian mass - to follow up with OB/GYN outpt. Medical hx signficant for chronic iron deficiency anemia, type 2 diabetes mellitus, essential hypertension, chronic diastolic heart failure, stage IIIa chronic kidney disease with a baseline creatinine 1.2-1.3, chronic low back pain, blind R eye, low vision L eye.    PT Comments    Patient progressing mobility slowly. Pt limited today by back pain>than dizziness. She reports dizziness remains constant. Min guard for safety with ambulation using RW and pt easily fatigued with short gait in hallway 2/2 back pain. HR stable in 70-80's throughout. Recommend pt use RW for safety with gait when she returns home and recommend HHPT follow up to progress mobility and independence. Pt reports she has been having great difficulty bathing due to the lack of a safe/appropriate shower seat. She would benefit from a tub bench to allow safe method to transfer in/out of tub to complete person care independently. Acute PT will follow up and progress mobility as able.     Follow Up Recommendations  Home health PT;Supervision/Assistance - 24 hour     Equipment Recommendations  Other (comment) (tub bench)    Recommendations for Other Services       Precautions / Restrictions Precautions Precautions: Fall Restrictions Weight Bearing Restrictions: No    Mobility  Bed Mobility               General bed mobility comments: pt sitting at EOB finishing breakfast.    Transfers Overall transfer level: Needs assistance Equipment used: Rolling walker (2 wheeled) Transfers: Sit to/from Stand Sit to Stand: Min  guard         General transfer comment: Min guard to rise for safety  Ambulation/Gait Ambulation/Gait assistance: Min guard Gait Distance (Feet): 50 Feet Assistive device: Rolling walker (2 wheeled) Gait Pattern/deviations: Step-to pattern;Decreased stride length;Shuffle;Trunk flexed Gait velocity: decr   General Gait Details: attempted small steps wtih SPC and pt very unsteady with wide BOS and requiring assist to prevent LOB. Balance improved with use of RW and pt required min guard with occasional cues for safety with walker position. Pt ambulated in hall short distance and back into room/bathroom to attemp voiding bladder.   Stairs             Wheelchair Mobility    Modified Rankin (Stroke Patients Only)       Balance Overall balance assessment: Needs assistance Sitting-balance support: No upper extremity supported Sitting balance-Leahy Scale: Good Sitting balance - Comments: pt able to sit on toilet and perform pericare and hygiene with wash rags and cleanser.   Standing balance support: Bilateral upper extremity supported Standing balance-Leahy Scale: Poor Standing balance comment: requiring RW                            Cognition Arousal/Alertness: Awake/alert Behavior During Therapy: WFL for tasks assessed/performed Overall Cognitive Status: Within Functional Limits for tasks assessed  Exercises      General Comments        Pertinent Vitals/Pain Pain Assessment: Faces Faces Pain Scale: Hurts little more Pain Location: low back pain Pain Descriptors / Indicators: Aching;Discomfort Pain Intervention(s): Limited activity within patient's tolerance;Monitored during session;Repositioned    Home Living                      Prior Function            PT Goals (current goals can now be found in the care plan section) Acute Rehab PT Goals Patient Stated Goal: return home PT  Goal Formulation: With patient Time For Goal Achievement: 12/15/20 Potential to Achieve Goals: Good Progress towards PT goals: Progressing toward goals    Frequency    Min 3X/week      PT Plan Current plan remains appropriate    Co-evaluation              AM-PAC PT "6 Clicks" Mobility   Outcome Measure  Help needed turning from your back to your side while in a flat bed without using bedrails?: A Little Help needed moving from lying on your back to sitting on the side of a flat bed without using bedrails?: A Little Help needed moving to and from a bed to a chair (including a wheelchair)?: A Little Help needed standing up from a chair using your arms (e.g., wheelchair or bedside chair)?: A Little Help needed to walk in hospital room?: A Little Help needed climbing 3-5 steps with a railing? : A Lot 6 Click Score: 17    End of Session Equipment Utilized During Treatment: Gait belt Activity Tolerance: Patient tolerated treatment well Patient left: with chair alarm set;in chair;with call bell/phone within reach Nurse Communication: Mobility status PT Visit Diagnosis: Other abnormalities of gait and mobility (R26.89);Muscle weakness (generalized) (M62.81);Dizziness and giddiness (R42)     Time: 7121-9758 PT Time Calculation (min) (ACUTE ONLY): 31 min  Charges:  $Gait Training: 8-22 mins $Therapeutic Activity: 8-22 mins                     [name], DPT Acute Rehabilitation Services Office [phone] Pager [phone]     [name] 12/02/2020, 2:32 PM

## 2020-12-02 NOTE — Progress Notes (Signed)
Inpatient Diabetes Program Recommendations  AACE/ADA: New Consensus Statement on Inpatient Glycemic Control (2015)  Target Ranges:  Prepandial:   less than 140 mg/dL      Peak postprandial:   less than 180 mg/dL (1-2 hours)      Critically ill patients:  140 - 180 mg/dL   Lab Results  Component Value Date   GLUCAP 405 (H) 12/02/2020   HGBA1C 8.3 (H) 12/02/2020    Review of Glycemic Control  Diabetes history: DM2 Outpatient Diabetes medications: 75/25 54 units BID Current orders for Inpatient glycemic control: Levemir 10 units BID, Novolog 0-15 units TID with meals and 0-5 HS  HgbA1C - 8.3% CBGs today: 187, 405 mg/dL  Inpatient Diabetes Program Recommendations:     70/30 28 units BID, starting 5/3 at 1700 D/C Levemir  Pt states her blood sugars have been well-controlled at home on the 75/25 54 units BID. Rarely has any lows.   Secure text to MD.  Will follow.  Thank you. Lorenda Peck, RD, LDN, CDE Inpatient Diabetes Coordinator [phone]

## 2020-12-02 NOTE — Progress Notes (Signed)
Patient with CBG of 405.  MD aware.  STAT lab draw ordered.

## 2020-12-02 NOTE — Procedures (Signed)
Procedure Note:  Preop Dx: postmenopausal bleeding  Postop Dx: same Procedure: endometrial biopsy Surgeon: Dorann Ou, MD EBL: scant Specimens: endometrium for surgical pathology Complications: none Procedure Details: the patient provided verbal informed consent and verbal time out was performed. The speculum was inserted into the vagina and the cervix was visualized. It was grasped with a tenaculum. The os finder was used to dilate the os. The endometrial pipelle was inserted to 6.5cm until resistance at the fundus was met. It was aspirated for moderate tissue. A second pass took place with aspiration. The tenaculum was removed.  Bimanual exam was performed and the cervix was palpably normal. The uterus was mobile and mildly enlarged. There wasn't a discrete pelvic mass appreciated, though body habitus limited the exam findings.  Rectovaginal exam showed no evidence of mucosal defects and no posterior cul de sac nodularity or parametrial thickening.  The patient tolerate the procedure well and was returned to her hospital bed in a stable condition.  Thereasa Solo, MD

## 2020-12-03 LAB — CBC
HCT: 42 % (ref 36.0–46.0)
Hemoglobin: 13.4 g/dL (ref 12.0–15.0)
MCH: 30.7 pg (ref 26.0–34.0)
MCHC: 31.9 g/dL (ref 30.0–36.0)
MCV: 96.1 fL (ref 80.0–100.0)
Platelets: 240 10*3/uL (ref 150–400)
RBC: 4.37 MIL/uL (ref 3.87–5.11)
RDW: 12.1 % (ref 11.5–15.5)
WBC: 10.7 10*3/uL — ABNORMAL HIGH (ref 4.0–10.5)
nRBC: 0 % (ref 0.0–0.2)

## 2020-12-03 LAB — GLUCOSE, CAPILLARY
Glucose-Capillary: 123 mg/dL — ABNORMAL HIGH (ref 70–99)
Glucose-Capillary: 216 mg/dL — ABNORMAL HIGH (ref 70–99)
Glucose-Capillary: 155 mg/dL — ABNORMAL HIGH (ref 70–99)
Glucose-Capillary: 192 mg/dL — ABNORMAL HIGH (ref 70–99)

## 2020-12-03 LAB — BASIC METABOLIC PANEL
Anion gap: 8 (ref 5–15)
BUN: 17 mg/dL (ref 8–23)
CO2: 20 mmol/L — ABNORMAL LOW (ref 22–32)
Calcium: 9 mg/dL (ref 8.9–10.3)
Chloride: 108 mmol/L (ref 98–111)
Creatinine, Ser: 1.12 mg/dL — ABNORMAL HIGH (ref 0.44–1.00)
GFR, Estimated: 54 mL/min — ABNORMAL LOW (ref >60)
Glucose, Bld: 127 mg/dL — ABNORMAL HIGH (ref 70–99)
Potassium: 3.3 mmol/L — ABNORMAL LOW (ref 3.5–5.1)
Sodium: 136 mmol/L (ref 135–145)

## 2020-12-03 LAB — MAGNESIUM: Magnesium: 2.3 mg/dL (ref 1.7–2.4)

## 2020-12-03 LAB — PHOSPHORUS: Phosphorus: 3 mg/dL (ref 2.5–4.6)

## 2020-12-03 MED ORDER — POTASSIUM CHLORIDE 20 MEQ PO PACK
40.0000 meq | PACK | Freq: Once | ORAL | Status: AC
  Administered 2020-12-03: 40 meq via ORAL
  Filled 2020-12-03: qty 2

## 2020-12-03 MED ORDER — FERROUS SULFATE 325 (65 FE) MG PO TABS: 325.0000 mg | ORAL_TABLET | Freq: Every day | ORAL | 0 refills | Status: DC

## 2020-12-03 MED ORDER — SENNOSIDES-DOCUSATE SODIUM 8.6-50 MG PO TABS
2.0000 | ORAL_TABLET | Freq: Two times a day (BID) | ORAL | Status: DC
  Administered 2020-12-03 – 2020-12-09 (×8): 2 via ORAL
  Filled 2020-12-03 (×10): qty 2

## 2020-12-03 MED ORDER — DILTIAZEM HCL ER 240 MG PO CP24: 240.0000 mg | ORAL_CAPSULE | Freq: Every day | ORAL | 0 refills | Status: DC

## 2020-12-03 MED ORDER — DICYCLOMINE HCL 10 MG PO CAPS
10.0000 mg | ORAL_CAPSULE | Freq: Once | ORAL | Status: AC
  Administered 2020-12-03: 10 mg via ORAL
  Filled 2020-12-03: qty 1

## 2020-12-03 NOTE — Progress Notes (Signed)
Received call from pt husband upset that wife called him stating in extreme pain and had not ate for today.  Obtained husband number and called him when arrived to pt room.  Pt moaning in pain.  Reviewed meds with husband and provided prn pain medication to patient and will follow up with MD regarding no BM since Sunday 11/30/2020. Husband appreciative of assistance and no further issues at this time.

## 2020-12-03 NOTE — TOC Initial Note (Signed)
Transition of Care [facility]) - Initial/Assessment Note    Patient Details  Name: [name] MRN: [id number] Date of Birth: Oct 04, 1955  Transition of Care [facility]) CM/SW Contact:    Trish Mage, LCSW Phone Number: 12/03/2020, 10:15 AM  Clinical Narrative:    Patient seen in follow up to PT recommendation of [facility].    Ms Delval lives here in [place] in with her husband; has no children nor family locally, but he has lots of family here. Husband is retired Nature conservation officer so is generally at home with her for supervision purposes.  She has a RW at home, confirms that she is need of a tub bench as the last time she took a bath she fell and hit her head getting out of the tub. Ms Kemler is also open to [facility] referral for PT, no preference of agencies expressed.  Cindie with [facility] confirmed ability to provide this service.  Contacted [place] with [facility] for delivery of tub bench. Husband will transport home.  No further needs identified. TOC will continue to follow during the course of hospitalization.            Expected Discharge Plan: [place] Barriers to Discharge: No Barriers Identified   Patient Goals and CMS Choice     Choice offered to / list presented to : Patient  Expected Discharge Plan and Services Expected Discharge Plan: [place]   Discharge Planning Services: CM Consult Post Acute Care Choice: Home Health,Durable Medical Equipment Living arrangements for the past 2 months: Apartment                                      Prior Living Arrangements/Services Living arrangements for the past 2 months: Apartment Lives with:: Spouse Patient language and need for interpreter reviewed:: Yes        Need for Family Participation in Patient Care: Yes (Comment) Care giver support system in place?: Yes (comment) Current home services: DME Criminal Activity/Legal Involvement Pertinent to Current Situation/Hospitalization: No - Comment as  needed  Activities of Daily Living Home Assistive Devices/Equipment: Cane (specify quad or straight) ADL Screening (condition at time of admission) Patient's cognitive ability adequate to safely complete daily activities?: Yes Is the patient deaf or have difficulty hearing?: No Does the patient have difficulty seeing, even when wearing glasses/contacts?: Yes Does the patient have difficulty concentrating, remembering, or making decisions?: Yes Patient able to express need for assistance with ADLs?: Yes Does the patient have difficulty dressing or bathing?: Yes Independently performs ADLs?: No Communication: Independent Dressing (OT): Needs assistance Is this a change from baseline?: Pre-admission baseline Grooming: Independent Feeding: Independent Bathing: Needs assistance Is this a change from baseline?: Pre-admission baseline Toileting: Needs assistance Is this a change from baseline?: Pre-admission baseline In/Out Bed: Independent with device (comment) Walks in Home: Independent with device (comment) Does the patient have difficulty walking or climbing stairs?: Yes Weakness of Legs: Both Weakness of Arms/Hands: Both  Permission Sought/Granted                  Emotional Assessment Appearance:: Appears stated age Attitude/Demeanor/Rapport: Engaged Affect (typically observed): Appropriate Orientation: : Oriented to Self,Oriented to Place,Oriented to  Time,Oriented to Situation Alcohol / Substance Use: Not Applicable Psych Involvement: No (comment)  Admission diagnosis:  PMB (postmenopausal bleeding) [N95.0] Hyperglycemia [R73.9] Post-menopause bleeding [N95.0] Generalized weakness [R53.1] Ovarian mass, left [N83.8]  Chronic left-sided low back pain without sciatica [M54.50, G89.29] Patient Active Problem List   Diagnosis Date Noted  . Nausea & vomiting 12/01/2020  . Ovarian mass, left 12/01/2020  . Hypokalemia 12/01/2020  . Leukocytosis 12/01/2020  . Generalized  weakness 11/30/2020  . Acute respiratory failure with hypoxia ([place]) 01/24/2020  . Chest pain 01/23/2020  . Acute exacerbation of CHF (congestive heart failure) ([place]) 01/23/2020  . Functional urinary incontinence 09/07/2019  . Urge incontinence of urine 09/07/2019  . Legally blind in right eye, as defined in [place] 08/11/2018  . Bilateral carpal tunnel syndrome 04/13/2018  . Adenomatous polyp of colon 04/13/2018  . Fibrocystic breast changes, right 12/12/2017  . Drug-induced constipation 09/15/2017  . Primary osteoarthritis of both knees 04/05/2017  . Chronic bilateral low back pain 03/07/2017  . Chronic pain of right knee 03/07/2017  . Vitamin B12 deficiency 12/09/2016  . Incidental lung nodule, > 55mm and < 39mm 09/14/2016  . Vitreous hemorrhage of right eye ([place])   . Diabetic gastroparesis associated with type 2 diabetes mellitus ([place]) 09/12/2015  . Controlled type 2 diabetes mellitus with complication, with long-term current use of insulin ([place]) 09/12/2015  . Chronic renal insufficiency, stage III (moderate) ([place]) 03/19/2015  . Prolonged Q-T interval on ECG 08/10/2013  . Dyslipidemia 04/02/2013  . Acute on chronic diastolic heart failure ([place]) 04/07/2012  . Presumed OSA (obstructive sleep apnea) 04/06/2012  . NSTEMI  post-op 04/04/12-medical Rx 04/04/2012  . Chronic diastolic heart failure ([place]) 04/2012  . Severe obesity (BMI >= 40) ([place]) 01/17/2012  . GERD 08/21/2009  . Complex endometrial hyperplasia with atypia 03/20/2009  . PERIPHERAL EDEMA 03/20/2009  . Coronary atherosclerosis-not a surgical or PCI candidate 2010 02/27/2009  . Proliferative diabetic retinopathy ([place]) 10/07/2007  . DETACHED RETINA, BILATERAL, HX OF 09/07/2007  . History of chronic Iron defeicency anemia with acute post op anemia, transfused after debridment 04/04/12 04/17/2007  . Essential hypertension 04/17/2007   PCP:  Ladell Pier, MD Pharmacy:   [facility] [address] [place] [address] Phone: [phone] Fax: [phone]  [place] [address] Phone: [phone] Fax: [phone]  [place], [address], [address] [place], [place] [address] Phone: [phone] Fax: [phone]  [facility] [address] [address] Phone: [phone] Fax: [phone]     Social Determinants of Health (SDOH) Interventions    Readmission Risk Interventions No flowsheet data found.

## 2020-12-03 NOTE — Progress Notes (Signed)
1310- Patient c/o of cramping unable to have BM in several days. Attempted to have BM on BSC but unsuccessful. MD was notified. Tap water enema was ordered.  39- Husband was called and updated.  Tap water enema was given patient. tolerated fine. Small BM this evening. Patient still feeling mildly cramping in abdomen. Dr Dwyane Dee said to have patient stay overnight to observe and added senokot.  Patient Husband [name] was updated this evening and asked for early morning discharge if possible.

## 2020-12-03 NOTE — Discharge Summary (Signed)
Physician Discharge Summary  [name] PRF:[id number] DOB: [birth year] DOA: 11/30/2020  PCP: Ladell Pier, MD  Admit date: 11/30/2020   Discharge date: 12/03/2020  Admitted From:  Home.  Disposition:  Home Health services  Recommendations for Outpatient Follow-up:  1. Follow up with PCP in 1-2 weeks. 2. Please obtain BMP/CBC in one week. 3. Advised to follow-up with urologist in 1 week. 4. Advised to follow-up with OB/GYN oncologist Dr. Harrington Challenger in 1 week.   Home Health: Yes Home PT Equipment/Devices: DME tub bench.  Discharge Condition: Stable CODE STATUS:Full code Diet recommendation: Heart Healthy   Brief Summary / Hospital Course: This 65 years old female with PMH significant for chronic iron deficiency anemia, type 2 diabetes, essential hypertension, chronic diastolic CHF,  CKD stage IIIa with a baseline creatinine of 1.2-1.3, chronic back pain presented the ED with generalized weakness, lower abdominal pain associated with nausea and vomiting. Patient also reported intermittent vaginal bleeding for last 1 week. CT abdomen and pelvis showed no acute abdominal or pelvic pathology but it showed complex cystic left ovarian mass. Pelvic ultrasound confirms the same.ED physician has spoken with OB/GYN who recommended checking CA125 level,  outpatient oncology and OB/GYN follow-up. Patient was admitted for AKI on CKD, uncontrolled diabetes and nausea and vomiting.  UA was unremarkable, chest x-ray no acute renal abnormality.  Patient was continued on Zofran.  CT abdomen shows moderate left hydroureteronephrosis to the level of left adnexal mass without any obstructing lesion.  Urology was consulted,  recommended there is no need for any acute intervention at this time.  Patient will follow up outpatient.  OB/GYN oncologist was consulted,  patient underwent endometrial biopsy,  pathology report is pending.  Patient is cleared to be discharged from urology and gynecologist.  Patient felt  better and wants to be discharged.  Home health services been arranged.  She was managed for below problems  Discharge Diagnoses:  Principal Problem:   Generalized weakness Active Problems:   Essential hypertension   Prolonged Q-T interval on ECG   Controlled type 2 diabetes mellitus with complication, with long-term current use of insulin (HCC)   Nausea & vomiting   Ovarian mass, left   Hypokalemia   Leukocytosis   Chronic diastolic heart failure (HCC)  Generalized weakness:  This could be due to dehydration in the context of recent nausea and vomiting and inability to take p.o..   Denies any focal neurological deficits. No evidence of any infectious process. UA : No infection, CXR : No acute abnormality. Continue IV hydration, PT and OT evaluation Patient has very low p.o. intake,  we will continue IV hydration.  Nausea and vomiting: Continue Zofran as needed for nausea and vomiting. CT abdomen and pelvis did not show any acute abdominal/ pelvic pathology.  Moderate left hydroureteronephrosis to the level of the left adnexal mass without an obstructing lesion. Urology has recommended no acute intervention needed at this point.  Prolonged QTc interval: Repeat EKG shows normal QTC. magnesium and other electrolytes normal.  Left ovarian mass: She is found to have left ovarian mass on CT abdomen and pelvis as well as pelvic ultrasound. Case was discussed with on call OB/GYN on-call and oncologist recommended outpatient oncology clinic and GYN follow-up.   CA125 > 234-235.  Patient underwent biopsy by Gyn oncologist , report is pending   Type 2 diabetes:  Moderate dose sliding scale,  hold oral hypoglycemic agents.   Continue  NovoLog 70/30 mix  28 units twice daily. Blood sugars  improving.  Are better controlled  Essential hypertension: Continue Toprol, Imdur and losartan.  Chronic diastolic CHF: Recent echocardiogram in June 21,  LVEF 65 to 70% with no evidence  of focal wall motion abnormalities.   Continue Lasix 120 mg p.o. twice daily.  Patient does not seem to have exacerbation.  CKD stage IIIb :   Serum creatinine slightly up today to 1.22. Avoid nephrotoxic medications,  serum creatinine at baseline. Moderate left hydroureteronephrosis to the level of the left adnexal mass without an obstructing lesion. Discussed with urologist recommended outpatient follow-up.  Discharge Instructions  Discharge Instructions    Call MD for:  difficulty breathing, headache or visual disturbances   Complete by: As directed    Call MD for:  persistant dizziness or light-headedness   Complete by: As directed    Call MD for:  persistant nausea and vomiting   Complete by: As directed    Diet - low sodium heart healthy   Complete by: As directed    Diet Carb Modified   Complete by: As directed    Discharge instructions   Complete by: As directed    Advised to follow-up with primary care physician in 1 week. Advised to follow-up with urologis in 1 week. Advised to follow-up with OB/GYN oncologist Dr. Harrington Challenger in 1 week.   Increase activity slowly   Complete by: As directed      Allergies as of 12/03/2020      Reactions   Ativan [lorazepam] Anaphylaxis   Orange Fruit [citrus] Anaphylaxis, Other (See Comments)   Pt stated throat swelling, itching      Medication List    STOP taking these medications   dapagliflozin propanediol 5 MG Tabs tablet Commonly known as: Farxiga   Lumigan 0.01 % Soln Generic drug: bimatoprost     TAKE these medications   Accu-Chek Softclix Lancets lancets Use as instructed for 3 times daily blood glucose monitoring   acetaminophen 500 MG tablet Commonly known as: TYLENOL Take 1 tablet (500 mg total) by mouth every 6 (six) hours as needed. What changed: reasons to take this   acetaZOLAMIDE 500 MG capsule Commonly known as: DIAMOX Take 500 mg by mouth 2 (two) times daily.   aspirin EC 81 MG tablet Take 81 mg by  mouth daily.   atorvastatin 80 MG tablet Commonly known as: LIPITOR TAKE 1 TABLET BY MOUTH ONCE DAILY IN THE EVENING AT 6  PM What changed:   how much to take  how to take this  when to take this  additional instructions   B-12 1000 MCG Tabs Take 1,000 mcg by mouth daily.   bismuth subsalicylate 102 HE/52DP suspension Commonly known as: PEPTO BISMOL Take 30 mLs by mouth every 6 (six) hours as needed for indigestion.   Blood Pressure Monitor/Arm Devi 1 each by Does not apply route daily. ICD 10, I10 and I50.32   brimonidine-timolol 0.2-0.5 % ophthalmic solution Commonly known as: COMBIGAN Place 1 drop into both eyes 2 (two) times daily.   calcium carbonate 500 MG chewable tablet Commonly known as: TUMS - dosed in mg elemental calcium Chew 1 tablet by mouth 3 (three) times daily as needed for indigestion.   diltiazem 240 MG 24 hr capsule Commonly known as: DILACOR XR Take 1 capsule (240 mg total) by mouth daily.   ferrous sulfate 325 (65 FE) MG tablet Take 1 tablet (325 mg total) by mouth daily with breakfast.   furosemide 40 MG tablet Commonly known as: LASIX TAKE 1  TABLET BY MOUTH  TWICE DAILY (TAKE WITH  FUROSEMIDE 80 MG TAB) What changed: See the new instructions.   furosemide 80 MG tablet Commonly known as: LASIX TAKE 1 TABLET BY MOUTH TWO  TIMES DAILY WITH FUROSEMIDE 40MG  (TOTAL DOSE OF 120MG  TWO TIMES DAILY ) What changed: See the new instructions.   glucose blood test strip Use TID before meals / Dx E11.9   glucose monitoring kit monitoring kit 1 each by Does not apply route 4 (four) times daily - after meals and at bedtime.   Accu-Chek Aviva Plus w/Device Kit Used as directed   HumaLOG Mix 75/25 (75-25) 100 UNIT/ML Susp injection Generic drug: insulin lispro protamine-lispro INJECT SUBCUTANEOUSLY 50  UNITS TWICE DAILY WITH  MEALS What changed: See the new instructions.   isosorbide mononitrate 120 MG 24 hr tablet Commonly known as: IMDUR TAKE  1 TABLET BY MOUTH ONCE DAILY   latanoprost 0.005 % ophthalmic solution Commonly known as: XALATAN Place 1 drop into the right eye at bedtime.   losartan 100 MG tablet Commonly known as: COZAAR Take 100 mg by mouth daily.   megestrol 40 MG tablet Commonly known as: MEGACE Take 1 tablet by mouth twice daily   metoprolol 200 MG 24 hr tablet Commonly known as: TOPROL-XL Take 1 tablet by mouth once daily   MUSCLE RUB EX Apply 1 application topically daily as needed (knee pain).   nitroGLYCERIN 0.4 MG SL tablet Commonly known as: NITROSTAT Place 1 tablet (0.4 mg total) under the tongue every 5 (five) minutes as needed for chest pain. Max 3 doses in 15 minutes. <PLEASE MAKE APPOINTMENT FOR REFILLS> What changed: additional instructions   pantoprazole 40 MG tablet Commonly known as: PROTONIX TAKE ONE TABLET BY MOUTH ONCE DAILY   potassium chloride SA 20 MEQ tablet Commonly known as: KLOR-CON TAKE 2 TABLETS BY MOUTH IN  THE MORNING AND 1 TABLET IN THE EVENING What changed:   how much to take  when to take this  additional instructions   ReliOn Insulin Syringe 31G X 15/64" 1 ML Misc Generic drug: Insulin Syringe-Needle U-100 USE AS DIRECTED TWICE DAILY FOR  INSULIN            Durable Medical Equipment  (From admission, onward)         Start     Ordered   12/03/20 1013  For home use only DME Tub bench  Once        12/03/20 1012          Follow-up Information    Schedule an appointment as soon as possible for a visit  with Dorothyann Gibbs, NP.   Specialty: Gynecologic Oncology Contact information: [place] [address] [phone]        Ceasar Mons, MD Follow up.   Specialty: Urology Why: call office for appt in 1 week Contact information: [address] [place] [address] [place], [facility] Follow up.   Specialty: [place] Why: This is the agency that will be  providing physical therapy in your home Contact information: [address] [place] [address] [phone]        Ladell Pier, MD Follow up in 1 week(s).   Specialty: Internal Medicine Contact information: [place] [address] [phone]        Sanda Klein, MD .   Specialty: Cardiology Contact information:  [address] [place]  [address] [phone]              Allergies  Allergen Reactions  . Ativan [Lorazepam] Anaphylaxis  . Orange Fruit [Citrus] Anaphylaxis and Other (See Comments)    Pt stated throat swelling, itching    Consultations:  Urology  Gyn oncology   Procedures/Studies: CT ABDOMEN PELVIS W CONTRAST  Result Date: 11/30/2020 CLINICAL DATA:  Abdominal pain.  Vaginal bleeding. EXAM: CT ABDOMEN AND PELVIS WITH CONTRAST TECHNIQUE: Multidetector CT imaging of the abdomen and pelvis was performed using the standard protocol following bolus administration of intravenous contrast. CONTRAST:  110m OMNIPAQUE IOHEXOL 300 MG/ML  SOLN COMPARISON:  01/15/2019 FINDINGS: Lower chest: No acute abnormality. Hepatobiliary: No focal liver abnormality is seen. No gallstones, gallbladder wall thickening, or biliary dilatation. Pancreas: Unremarkable. No pancreatic ductal dilatation or surrounding inflammatory changes. Spleen: Normal in size without focal abnormality. Adrenals/Urinary Tract: Normal adrenal glands. Normal right kidney. No renal mass. Moderate left hydroureteronephrosis to the level of the left adnexa. Normal bladder. Stomach/Bowel: Stomach is within normal limits. No evidence of bowel wall thickening, distention, or inflammatory changes. Vascular/Lymphatic: Normal caliber abdominal aorta with mild atherosclerosis. No lymphadenopathy. Reproductive: Small dystrophic calcifications within the uterus likely reflecting small fibroids. Enlarged left ovary with a complex cystic left ovarian mass measuring  5.3 x 2.6 x 6.1 cm with a few internal septations incompletely characterized on this exam. Other: No ascites.  Small fat containing umbilical hernia. Musculoskeletal: No acute osseous abnormality. No aggressive osseous lesion. IMPRESSION: 1. No acute abdominal or pelvic pathology. 2. Moderate left hydroureteronephrosis to the level of the left adnexal mass without an obstructing lesion. 3. Complex cystic left ovarian mass measuring 5.3 x 2.6 x 6.1 cm. Recommend further characterization with a pelvic ultrasound. Electronically Signed   By: HKathreen Devoid  On: 11/30/2020 15:51   [place]PELVIC COMPLETE W TRANSVAGINAL AND TORSION R/O  Result Date: 11/30/2020 CLINICAL DATA:  Cystic mass in the left ovary. EXAM: TRANSABDOMINAL AND TRANSVAGINAL ULTRASOUND OF PELVIS DOPPLER ULTRASOUND OF OVARIES TECHNIQUE: Both transabdominal and transvaginal ultrasound examinations of the pelvis were performed. Transabdominal technique was performed for global imaging of the pelvis including uterus, ovaries, adnexal regions, and pelvic cul-de-sac. It was necessary to proceed with endovaginal exam following the transabdominal exam to visualize the ovaries. Color and duplex Doppler ultrasound was utilized to evaluate blood flow to the ovaries. COMPARISON:  CT earlier in the same day FINDINGS: Uterus Measurements: 9.9 x 5.4 x 7 cm = volume: 196 mL. There is a 3 cm fibroid Endometrium Thickness: 9 mm.  No focal abnormality visualized. Right ovary Not visualized Left ovary Measurements: 7.9 x 5.6 x 6.2 cm = volume: 143 mL. There is a complex cystic mass measuring 5.6 x 5.5 x 5.8 cm. This mass demonstrates thick internal septations with possible areas of color Doppler flow involving the septations. Pulsed Doppler evaluation of the left ovary demonstrates normal arterial and venous waveforms. The right ovary was not adequately visualized. Other findings No abnormal free fluid. IMPRESSION: 1. Again noted is a complex cystic mass involving the left  adnexa. Ovarian malignancy is not excluded. Gynecologic follow-up is recommended. A nonemergent contrast enhanced MRI of the pelvis may be useful for further characterization. 2. Nonvisualization of the right ovary. 3. Thickened endometrium measuring 9 mm. Endometrial thickness is considered abnormal for an asymptomatic post-menopausal female. Endometrial sampling should be considered to exclude carcinoma. Electronically Signed   By: CConstance HolsterM.D.   On:  11/30/2020 17:56        Subjective: Patient was seen and examined at bedside.  Overnight events noted.  Patient reports feeling better.   Patient wants to be discharged home,  home health services been arranged.  Discharge Exam: Vitals:   12/03/20 0435 12/03/20 1033  BP: (!) 146/77 (!) 156/86  Pulse: 60 65  Resp: 16   Temp: 98.5 F (36.9 C)   SpO2: 100%    Vitals:   12/02/20 2136 12/03/20 0435 12/03/20 0449 12/03/20 1033  BP: 132/69 (!) 146/77  (!) 156/86  Pulse: (!) 57 60  65  Resp: 17 16    Temp: 98.4 F (36.9 C) 98.5 F (36.9 C)    TempSrc: Oral Oral    SpO2: 100% 100%    Weight:   130.7 kg   Height:        General: Pt is alert, awake, not in acute distress Cardiovascular: RRR, S1/S2 +, no rubs, no gallops Respiratory: CTA bilaterally, no wheezing, no rhonchi Abdominal: Soft, NT, ND, bowel sounds + Extremities: no edema, no cyanosis    The results of significant diagnostics from this hospitalization (including imaging, microbiology, ancillary and laboratory) are listed below for reference.     Microbiology: Recent Results (from the past 240 hour(s))  SARS CORONAVIRUS 2 (TAT 6-24 HRS) Nasopharyngeal Nasopharyngeal Swab     Status: None   Collection Time: 11/30/20 10:11 PM   Specimen: Nasopharyngeal Swab  Result Value Ref Range Status   SARS Coronavirus 2 NEGATIVE NEGATIVE Final    Comment: (NOTE) SARS-CoV-2 target nucleic acids are NOT DETECTED.  The SARS-CoV-2 RNA is generally detectable in upper and  lower respiratory specimens during the acute phase of infection. Negative results do not preclude SARS-CoV-2 infection, do not rule out co-infections with other pathogens, and should not be used as the sole basis for treatment or other patient management decisions. Negative results must be combined with clinical observations, patient history, and epidemiological information. The expected result is Negative.  Fact Sheet for Patients: SugarRoll.be  Fact Sheet for Healthcare Providers: https://www.woods-mathews.com/  This test is not yet approved or cleared by the [place] FDA and  has been authorized for detection and/or diagnosis of SARS-CoV-2 by FDA under an Emergency Use Authorization (EUA). This EUA will remain  in effect (meaning this test can be used) for the duration of the COVID-19 declaration under Se ction 564(b)(1) of the Act, 21 U.S.C. section 360bbb-3(b)(1), unless the authorization is terminated or revoked sooner.  Performed at [facility], [place] [address], [place], [place] [address]      Labs: BNP (last 3 results) Recent Labs    01/23/20 2329  BNP 84.6   Basic Metabolic Panel: Recent Labs  Lab 11/30/20 1420 11/30/20 1438 12/01/20 0510 12/02/20 0510 12/02/20 1228 12/03/20 0458  NA 136 137 141 138  --  136  K 3.5 3.4* 4.0 3.5  --  3.3*  CL 101 100 104 107  --  108  CO2 22  --  26 23  --  20*  GLUCOSE 308* 320* 344* 156* 462* 127*  BUN _0 --  17  CREATININE 1.14* 1.00 0.87 1.22*  --  1.12*  CALCIUM 8.9  --  9.1 8.6*  --  9.0  MG 2.0  --  2.1 2.2  --  2.3  PHOS  --   --   --  3.1  --  3.0   Liver Function Tests: Recent Labs  Lab 11/30/20 1420 12/01/20 0510  AST 18 16  ALT 12 14  ALKPHOS 56 54  BILITOT 0.9 0.4  PROT 8.0 7.4  ALBUMIN 3.5 3.4*   No results for input(s): LIPASE, AMYLASE in the last 168 hours. No results for input(s): AMMONIA in the last 168 hours. CBC: Recent Labs   Lab 11/30/20 1420 11/30/20 1438 12/01/20 0510 12/02/20 0510 12/03/20 0458  WBC 13.5*  --  13.6* 9.7 10.7*  NEUTROABS 11.4*  --  10.4*  --   --   HGB 13.3 14.3 12.7 11.6* 13.4  HCT 40.0 42.0 39.1 36.5 42.0  MCV 93.2  --  94.7 97.9 96.1  PLT 245  --  252 203 240   Cardiac Enzymes: No results for input(s): CKTOTAL, CKMB, CKMBINDEX, TROPONINI in the last 168 hours. BNP: Invalid input(s): POCBNP CBG: Recent Labs  Lab 12/02/20 0754 12/02/20 1152 12/02/20 1633 12/02/20 2137 12/03/20 0807  GLUCAP 187* 405* 305* 205* 123*   D-Dimer No results for input(s): DDIMER in the last 72 hours. Hgb A1c Recent Labs    12/02/20 0510  HGBA1C 8.3*   Lipid Profile No results for input(s): CHOL, HDL, LDLCALC, TRIG, CHOLHDL, LDLDIRECT in the last 72 hours. Thyroid function studies Recent Labs    12/01/20 0510  TSH 0.243*   Anemia work up No results for input(s): VITAMINB12, FOLATE, FERRITIN, TIBC, IRON, RETICCTPCT in the last 72 hours. Urinalysis    Component Value Date/Time   COLORURINE RED (A) 11/30/2020 1509   APPEARANCEUR HAZY (A) 11/30/2020 1509   LABSPEC 1.006 11/30/2020 1509   PHURINE 9.0 (H) 11/30/2020 1509   GLUCOSEU >=500 (A) 11/30/2020 1509   HGBUR LARGE (A) 11/30/2020 1509   HGBUR large 04/03/2010 0931   BILIRUBINUR NEGATIVE 11/30/2020 1509   KETONESUR 5 (A) 11/30/2020 1509   PROTEINUR 100 (A) 11/30/2020 1509   UROBILINOGEN 0.2 03/22/2013 1159   NITRITE NEGATIVE 11/30/2020 1509   LEUKOCYTESUR NEGATIVE 11/30/2020 1509   Sepsis Labs Invalid input(s): PROCALCITONIN,  WBC,  LACTICIDVEN Microbiology Recent Results (from the past 240 hour(s))  SARS CORONAVIRUS 2 (TAT 6-24 HRS) Nasopharyngeal Nasopharyngeal Swab     Status: None   Collection Time: 11/30/20 10:11 PM   Specimen: Nasopharyngeal Swab  Result Value Ref Range Status   SARS Coronavirus 2 NEGATIVE NEGATIVE Final    Comment: (NOTE) SARS-CoV-2 target nucleic acids are NOT DETECTED.  The SARS-CoV-2 RNA is  generally detectable in upper and lower respiratory specimens during the acute phase of infection. Negative results do not preclude SARS-CoV-2 infection, do not rule out co-infections with other pathogens, and should not be used as the sole basis for treatment or other patient management decisions. Negative results must be combined with clinical observations, patient history, and epidemiological information. The expected result is Negative.  Fact Sheet for Patients: SugarRoll.be  Fact Sheet for Healthcare Providers: https://www.woods-mathews.com/  This test is not yet approved or cleared by the [place] FDA and  has been authorized for detection and/or diagnosis of SARS-CoV-2 by FDA under an Emergency Use Authorization (EUA). This EUA will remain  in effect (meaning this test can be used) for the duration of the COVID-19 declaration under Se ction 564(b)(1) of the Act, 21 U.S.C. section 360bbb-3(b)(1), unless the authorization is terminated or revoked sooner.  Performed at [facility], [place] [address] [place], [place] [address]      Time coordinating discharge: Over 30 minutes  SIGNED:   Shawna Clamp, MD  [facility] 12/03/2020, 11:34 AM Pager  If 7PM-7AM, please contact night-coverage [email or web address]

## 2020-12-03 NOTE — Consult Note (Signed)
Urology Consult   Physician requesting consult: Shawna Clamp, MD  Reason for consult: Left hydronephrosis  History of Present Illness: [name] is a 65 y.o. 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  She was initially admitted on 11/30/2020 due to generalized abdominal pain, weakness and nausea.  She had a CT scan on 11/30/2020 that demonstrated a complex left ovarian mass measuring 6.1 cm in greatest dimension that is likely causing extrinsic compression of the distal aspects of the left ureter, resulting in left-sided hydronephrosis.  Her serum creatinine on 11/30/2020 was found to be 1.14, which is baseline for her over the past several years, and has remained stable throughout her admission.  Currently, the patient is resting comfortably in bed and has no specific complaints of flank pain or nausea/vomiting.  The patient denies a history of voiding or storage urinary symptoms, hematuria, UTIs, STDs, urolithiasis, GU malignancy/trauma/surgery.  Past Medical History:  Diagnosis Date  . Blind right eye   . Chronic back pain    "my whole back" (03/18/2015)  . Chronic chest pain    takes isosorbide  . Chronic diastolic heart failure ([place]) 9/13   echo 03/20/15 LV Ef of 60-65%, grade 2DD and PA peak pressure: 36 mm Hg   . CKD (chronic kidney disease), stage III ([place])   . Coronary artery disease    Dr Sallyanne Kuster, cardiac cath 02-07-2009 -- diffuse 3V CAD involving RCA, CFx, and D1,  pt not a candidate for bypass surgery or angioplasty,  medical management  . Diabetic neuropathy ([place])   . Diabetic retinopathy ([place])    bilateral proliferative  . Diastolic CHF, chronic ([place])   . Dyspnea    "always"  . Edema of both lower extremities   . Generalized weakness   . GERD (gastroesophageal reflux disease)   . Glaucoma, both eyes   . History of non-ST elevation myocardial infarction (NSTEMI)    02-25-2009;  05-15-2009;  01-08-2010;  04-04-2012 this MI was post-op  surgery for abscess on 04-03-2012  . History of sepsis   . Hyperlipemia   . Hypertension   . Insulin dependent type 2 diabetes mellitus ([place])    followed by pcp  . Iron deficiency anemia due to chronic blood loss    uterine bleeding  . Legally blind in left eye, as defined in [place]    per pt states vision "is foggy"  . Mild obstructive sleep apnea    study in epic 04-19-2012 mild osa,  recommendation's were wt. loss, dental  appliance, surgery, or cpap  . Morbid obesity with BMI of 40.0-44.9, adult ([place])   . OA (osteoarthritis)    knees  . PSVT (paroxysmal supraventricular tachycardia) ([place])   . Renal insufficiency    pt. denies  . Seasonal allergies   . Wears glasses     Past Surgical History:  Procedure Laterality Date  . BIOPSY  02/13/2018   Procedure: BIOPSY;  Surgeon: Ladene Artist, MD;  Location: [facility];  Service: Endoscopy;;  . BREAST SURGERY Right 2019    breast biopsy,  per pt benign  . CARDIAC CATHETERIZATION  02-27-2009   dr al little   diffuse 3V CAD , involving RCA, CFx, and D1;  inferior wall abnormalities, low normal ef 50%  . CATARACT EXTRACTION Right   . CATARACT EXTRACTION W/ INTRAOCULAR LENS IMPLANT Left 10-11-2007   @[facility]   . COLONOSCOPY WITH PROPOFOL N/A 02/13/2018   Procedure: COLONOSCOPY WITH PROPOFOL;  Surgeon: Ladene Artist, MD;  Location: [facility];  Service: Endoscopy;  Laterality: N/A;  . DILATION AND CURETTAGE OF UTERUS  x2 last one 2000  . EYE SURGERY Bilateral 2009 to 2010   x2  left eye;  x2  right eye   . HYSTEROSCOPY WITH D & C N/A 10/11/2018   Procedure: DILATATION AND CURETTAGE /HYSTEROSCOPY;  Surgeon: [name], MD;  Location: [facility];  Service: Gynecology;  Laterality: N/A;  . INCISION AND DRAINAGE PERIRECTAL ABSCESS  04/03/2012  . INTRAUTERINE DEVICE (IUD) INSERTION  10/11/2018   Procedure: INTRAUTERINE DEVICE (IUD) INSERTION;  Surgeon: [name], MD;  Location: [facility];  Service: Gynecology;;  . IR US GUIDE [place] LEFT   02/13/2018  . IR VENIPUNCTURE 85YRS/OLDER BY MD  02/13/2018  . POLYPECTOMY  02/13/2018   Procedure: POLYPECTOMY;  Surgeon: Ladene Artist, MD;  Location: [facility];  Service: Endoscopy;;    Current Hospital Medications:  Home Meds:  Current Meds  Medication Sig  . acetaminophen (TYLENOL) 500 MG tablet Take 1 tablet (500 mg total) by mouth every 6 (six) hours as needed. (Patient taking differently: Take 500 mg by mouth every 6 (six) hours as needed (pain.).)  . aspirin EC 81 MG tablet Take 81 mg by mouth daily.  Marland Kitchen atorvastatin (LIPITOR) 80 MG tablet TAKE 1 TABLET BY MOUTH ONCE DAILY IN THE EVENING AT 6  PM (Patient taking differently: Take 80 mg by mouth every evening.)  . bismuth subsalicylate (PEPTO BISMOL) 262 MG/15ML suspension Take 30 mLs by mouth every 6 (six) hours as needed for indigestion.  . brimonidine-timolol (COMBIGAN) 0.2-0.5 % ophthalmic solution Place 1 drop into both eyes 2 (two) times daily.   . calcium carbonate (TUMS - DOSED IN MG ELEMENTAL CALCIUM) 500 MG chewable tablet Chew 1 tablet by mouth 3 (three) times daily as needed for indigestion.   . Cyanocobalamin (VITAMIN B-12) 1000 MCG TABS Take 1,000 mcg by mouth daily.  . furosemide (LASIX) 40 MG tablet TAKE 1 TABLET BY MOUTH  TWICE DAILY (TAKE WITH  FUROSEMIDE 80 MG TAB) (Patient taking differently: Take 40 mg by mouth 2 (two) times daily. Take along with the 80 mg tablet for total dose of 120mg  twice daily per patient with)  . furosemide (LASIX) 80 MG tablet TAKE 1 TABLET BY MOUTH TWO  TIMES DAILY WITH FUROSEMIDE 40MG   (TOTAL DOSE OF 120MG   TWO TIMES DAILY ) (Patient taking differently: Take 80 mg by mouth 2 (two) times daily. Take along with the 40 mg tablet for total dose of 120 mg twice daily per patient.)  . HUMALOG MIX 75/25 (75-25) 100 UNIT/ML SUSP injection INJECT SUBCUTANEOUSLY 50  UNITS TWICE DAILY WITH  MEALS (Patient taking differently: Inject 54 Units into the skin 2 (two) times daily with a meal.)  .  isosorbide mononitrate (IMDUR) 120 MG 24 hr tablet TAKE 1 TABLET BY MOUTH ONCE DAILY (Patient taking differently: Take 120 mg by mouth daily.)  . latanoprost (XALATAN) 0.005 % ophthalmic solution Place 1 drop into the right eye at bedtime.  Marland Kitchen losartan (COZAAR) 100 MG tablet Take 100 mg by mouth daily.  . megestrol (MEGACE) 40 MG tablet Take 1 tablet by mouth twice daily  . Menthol-Methyl Salicylate (MUSCLE RUB EX) Apply 1 application topically daily as needed (knee pain).   . metoprolol (TOPROL-XL) 200 MG 24 hr tablet Take 1 tablet by mouth once daily  . nitroGLYCERIN (NITROSTAT) 0.4 MG SL tablet Place 1 tablet (0.4 mg total) under the tongue every 5 (five)  minutes as needed for chest pain. Max 3 doses in 15 minutes. <PLEASE MAKE APPOINTMENT FOR REFILLS> (Patient taking differently: Place 0.4 mg under the tongue every 5 (five) minutes as needed for chest pain.)  . potassium chloride SA (KLOR-CON) 20 MEQ tablet TAKE 2 TABLETS BY MOUTH IN  THE MORNING AND 1 TABLET IN THE EVENING (Patient taking differently: Take 20-40 mEq by mouth See admin instructions. Taking 2 tabs (40 MEQ) in the morning and  1 tab (20 MEQ) in the evening.)    Scheduled Meds: . acetaZOLAMIDE  500 mg Oral BID  . atorvastatin  80 mg Oral QPM  . brimonidine  1 drop Both Eyes BID   And  . timolol  1 drop Both Eyes BID  . dicyclomine  10 mg Oral Once  . diltiazem  240 mg Oral Daily  . furosemide  120 mg Oral BID  . insulin aspart  0-15 Units Subcutaneous TID WC  . insulin aspart  0-5 Units Subcutaneous QHS  . insulin aspart protamine- aspart  28 Units Subcutaneous BID WC  . isosorbide mononitrate  120 mg Oral Daily  . latanoprost  1 drop Right Eye QHS  . losartan  100 mg Oral Daily  . metoprolol  200 mg Oral Daily  . pantoprazole  40 mg Oral Daily   Continuous Infusions: . sodium chloride 75 mL/hr at 12/02/20 1405   PRN Meds:.acetaminophen **OR** acetaminophen, alum & mag hydroxide-simeth, fentaNYL (SUBLIMAZE) injection,  ondansetron (ZOFRAN) IV  Allergies:  Allergies  Allergen Reactions  . Ativan [Lorazepam] Anaphylaxis  . Orange Fruit [Citrus] Anaphylaxis and Other (See Comments)    Pt stated throat swelling, itching    Family History  Problem Relation Age of Onset  . Diabetes Mother   . Heart disease Mother   . Heart attack Mother   . Hypertension Mother   . Breast cancer Mother   . Colon polyps Father   . Diabetes Father   . Heart disease Father   . Heart attack Father   . Stroke Father   . Hypertension Father   . Diabetes Brother   . Diabetes Sister   . Heart disease Brother   . Heart disease Sister   . Diabetes Maternal Grandmother   . Hypertension Maternal Grandmother   . Hypertension Brother   . Hypertension Sister   . Breast cancer Cousin   . Colon cancer Cousin   . Esophageal cancer Neg Hx   . Rectal cancer Neg Hx   . Stomach cancer Neg Hx     Social History:  reports that she has never smoked. She has never used smokeless tobacco. She reports that she does not drink alcohol and does not use drugs.  ROS: A complete review of systems was performed.  All systems are negative except for pertinent findings as noted.  Physical Exam:  Vital signs in last 24 hours: Temp:  [98 F (36.7 C)-98.5 F (36.9 C)] 98 F (36.7 C) (05/04 1331) Pulse Rate:  [57-65] 58 (05/04 1331) Resp:  [16-20] 20 (05/04 1331) BP: (132-156)/(59-86) 150/59 (05/04 1331) SpO2:  [100 %] 100 % (05/04 1331) Weight:  [130.7 kg] 130.7 kg (05/04 0449) Constitutional:  Alert and oriented, No acute distress Cardiovascular: Regular rate and rhythm, No JVD Respiratory: Normal respiratory effort, Lungs clear bilaterally GI: Abdomen is soft, nontender, nondistended, no abdominal masses GU: No CVA tenderness Lymphatic: No lymphadenopathy Neurologic: Grossly intact, no focal deficits Psychiatric: Normal mood and affect  Laboratory Data:  Recent Labs  11/30/20 1420 11/30/20 1438 12/01/20 0510 12/02/20 0510  12/03/20 0458  WBC 13.5*  --  13.6* 9.7 10.7*  HGB 13.3 14.3 12.7 11.6* 13.4  HCT 40.0 42.0 39.1 36.5 42.0  PLT 245  --  252 203 240    Recent Labs    11/30/20 1420 11/30/20 1438 12/01/20 0510 12/02/20 0510 12/02/20 1228 12/03/20 0458  NA 136 137 141 138  --  136  K 3.5 3.4* 4.0 3.5  --  3.3*  CL 101 100 104 107  --  108  GLUCOSE 308* 320* 344* 156* 462* 127*  BUN 16 15 18 17   --  17  CALCIUM 8.9  --  9.1 8.6*  --  9.0  CREATININE 1.14* 1.00 0.87 1.22*  --  1.12*     Results for orders placed or performed during the hospital encounter of 11/30/20 (from the past 24 hour(s))  Glucose, capillary     Status: Abnormal   Collection Time: 12/02/20  4:33 PM  Result Value Ref Range   Glucose-Capillary 305 (H) 70 - 99 mg/dL  Glucose, capillary     Status: Abnormal   Collection Time: 12/02/20  9:37 PM  Result Value Ref Range   Glucose-Capillary 205 (H) 70 - 99 mg/dL  CBC     Status: Abnormal   Collection Time: 12/03/20  4:58 AM  Result Value Ref Range   WBC 10.7 (H) 4.0 - 10.5 K/uL   RBC 4.37 3.87 - 5.11 MIL/uL   Hemoglobin 13.4 12.0 - 15.0 g/dL   HCT 42.0 36.0 - 46.0 %   MCV 96.1 80.0 - 100.0 fL   MCH 30.7 26.0 - 34.0 pg   MCHC 31.9 30.0 - 36.0 g/dL   RDW 12.1 11.5 - 15.5 %   Platelets 240 150 - 400 K/uL   nRBC 0.0 0.0 - 0.2 %  Magnesium     Status: None   Collection Time: 12/03/20  4:58 AM  Result Value Ref Range   Magnesium 2.3 1.7 - 2.4 mg/dL  Phosphorus     Status: None   Collection Time: 12/03/20  4:58 AM  Result Value Ref Range   Phosphorus 3.0 2.5 - 4.6 mg/dL  Basic metabolic panel     Status: Abnormal   Collection Time: 12/03/20  4:58 AM  Result Value Ref Range   Sodium 136 135 - 145 mmol/L   Potassium 3.3 (L) 3.5 - 5.1 mmol/L   Chloride 108 98 - 111 mmol/L   CO2 20 (L) 22 - 32 mmol/L   Glucose, Bld 127 (H) 70 - 99 mg/dL   BUN 17 8 - 23 mg/dL   Creatinine, Ser 1.12 (H) 0.44 - 1.00 mg/dL   Calcium 9.0 8.9 - 10.3 mg/dL   GFR, Estimated 54 (L) >60 mL/min    Anion gap 8 5 - 15  Glucose, capillary     Status: Abnormal   Collection Time: 12/03/20  8:07 AM  Result Value Ref Range   Glucose-Capillary 123 (H) 70 - 99 mg/dL  Glucose, capillary     Status: Abnormal   Collection Time: 12/03/20 11:53 AM  Result Value Ref Range   Glucose-Capillary 216 (H) 70 - 99 mg/dL   Recent Results (from the past 240 hour(s))  SARS CORONAVIRUS 2 (TAT 6-24 HRS) Nasopharyngeal Nasopharyngeal Swab     Status: None   Collection Time: 11/30/20 10:11 PM   Specimen: Nasopharyngeal Swab  Result Value Ref Range Status   SARS Coronavirus 2 NEGATIVE NEGATIVE Final  Comment: (NOTE) SARS-CoV-2 target nucleic acids are NOT DETECTED.  The SARS-CoV-2 RNA is generally detectable in upper and lower respiratory specimens during the acute phase of infection. Negative results do not preclude SARS-CoV-2 infection, do not rule out co-infections with other pathogens, and should not be used as the sole basis for treatment or other patient management decisions. Negative results must be combined with clinical observations, patient history, and epidemiological information. The expected result is Negative.  Fact Sheet for Patients: SugarRoll.be  Fact Sheet for Healthcare Providers: https://www.woods-mathews.com/  This test is not yet approved or cleared by the [place] FDA and  has been authorized for detection and/or diagnosis of SARS-CoV-2 by FDA under an Emergency Use Authorization (EUA). This EUA will remain  in effect (meaning this test can be used) for the duration of the COVID-19 declaration under Se ction 564(b)(1) of the Act, 21 U.S.C. section 360bbb-3(b)(1), unless the authorization is terminated or revoked sooner.  Performed at [facility], [place] [address] [place], [place] [address]     Renal Function: Recent Labs    11/30/20 1420 11/30/20 1438 12/01/20 0510 12/02/20 0510 12/03/20 0458  CREATININE  1.14* 1.00 0.87 1.22* 1.12*   Estimated Creatinine Clearance: 65.3 mL/min (A) (by C-G formula based on SCr of 1.12 mg/dL (H)).  Radiologic Imaging: No results found.  I independently reviewed the above imaging studies.  Impression/Recommendation 65 year old female with left-sided hydronephrosis secondary to a 6.1 cm left ovarian mass of unknown etiology  -Awaiting final pathology from endometrial biopsy -Given the stability of her renal function and lack of specific left renal colic, I will defer left ureteral stent placement at this time.  I will plan for close follow-up in my office in the next 2 to 3 weeks to recheck her renal function and symptoms.  Ellison Hughs, MD [facility] 12/03/2020, 2:02 PM

## 2020-12-04 DIAGNOSIS — R531 Weakness: Secondary | ICD-10-CM | POA: Diagnosis not present

## 2020-12-04 LAB — GLUCOSE, CAPILLARY
Glucose-Capillary: 90 mg/dL (ref 70–99)
Glucose-Capillary: 245 mg/dL — ABNORMAL HIGH (ref 70–99)
Glucose-Capillary: 271 mg/dL — ABNORMAL HIGH (ref 70–99)
Glucose-Capillary: 256 mg/dL — ABNORMAL HIGH (ref 70–99)

## 2020-12-04 LAB — SURGICAL PATHOLOGY

## 2020-12-04 MED ORDER — BISACODYL 5 MG PO TBEC
5.0000 mg | DELAYED_RELEASE_TABLET | Freq: Every day | ORAL | Status: DC | PRN
  Administered 2020-12-04: 5 mg via ORAL
  Filled 2020-12-04: qty 1

## 2020-12-04 MED ORDER — DICYCLOMINE HCL 10 MG PO CAPS
10.0000 mg | ORAL_CAPSULE | Freq: Three times a day (TID) | ORAL | Status: DC
  Administered 2020-12-04 – 2020-12-06 (×10): 10 mg via ORAL
  Filled 2020-12-04 (×24): qty 1

## 2020-12-04 NOTE — Progress Notes (Addendum)
PROGRESS NOTE    LORISSA KISHBAUGH  MWN:[id number] DOB: 13-Jan-1956 DOA: 11/30/2020 PCP: Ladell Pier, MD    Brief Narrative: This 65 years old female with PMH significant for chronic iron deficiency anemia, type 2 diabetes, essential hypertension, chronic diastolic CHF,  CKD stage IIIa with a baseline creatinine of 1.2-1.3, chronic back pain presenting the ED with generalized weakness, lower abdominal pain associated with nausea and vomiting.  Patient also reported intermittent vaginal bleeding for last 1 week. CT abdomen and pelvis showed no acute abdominal or pelvic pathology but it showed complex cystic left ovarian mass. Pelvic ultrasound confirms the same.  ED physician has spoken with OB/GYN who recommended checking CA125 level,  outpatient oncology and OB/GYN follow-up.  Patient underwent biopsy report is pending.  Urology recommended no acute intervention needed.  Patient renal functions are improving.  Patient was supposed to be discharged yesterday but then has developed abdominal pain with severe constipation.  Assessment & Plan:   Principal Problem:   Generalized weakness Active Problems:   Essential hypertension   Prolonged Q-T interval on ECG   Controlled type 2 diabetes mellitus with complication, with long-term current use of insulin (HCC)   Nausea & vomiting   Ovarian mass, left   Hypokalemia   Leukocytosis   Chronic diastolic heart failure (HCC)   Generalized weakness:  This could be due to dehydration in the context of recent nausea and vomiting and inability to take p.o..   Denies any focal neurological deficits. No evidence of any infectious process. UA : No infection, CXR : No acute abnormality  Continue IV hydration, PT and OT evaluation Patient has very low p.o. intake,  we will continue IV hydration. PT reassessment recommended SNF.  Nausea and vomiting: Continue Zofran as needed for nausea and vomiting. CT abdomen and pelvis did not show any acute  abdominal/ pelvic pathology.  Moderate left hydroureteronephrosis to the level of the left adnexal mass without an obstructing lesion. Urology has recommended no acute intervention needed at this point.  Prolonged QTc interval: Repeat EKG shows normal QTC,   magnesium and other electrolytes normal.  Left ovarian mass: She is found to have left ovarian mass on CT abdomen and pelvis as well as pelvic ultrasound. Case was discussed with on call OB/GYN on-call and oncologist recommended outpatient oncology clinic and GYN follow-up. CA125 > 234-235.  Patient underwent biopsy by Gyn oncologist , report is pending  Type 2 diabetes:  Moderate dose sliding scale,  hold oral hypoglycemic agents.   Continue NovoLog 70/30 mix  28 units twice daily. Blood sugars improving and better controlled.  Essential hypertension: Continue Toprol, Imdur and losartan.  Chronic diastolic CHF: Recent echocardiogram in June 21,  LVEF 65 to 70% with no evidence of focal wall motion abnormalities.   Continue Lasix 120 mg p.o. twice daily.  Patient does not seem to have exacerbation.  CKD stage IIIb :   Serum creatinine slightly up today to 1.22. Avoid nephrotoxic medications,  serum creatinine at baseline. Moderate left hydroureteronephrosis to the level of the left adnexal mass without an obstructing lesion. Discussed with urologist recommended outpatient follow-up.  Constipation: Patient had worsening constipation.  She was given Senokot, tapwater enema twice had a little bowel movement and still feels bloated. Start Dulcolax daily until she has a bowel movement.    DVT prophylaxis:  Heparin Code Status: Full code. Family Communication: No family at bed side. Disposition Plan:  Status is: Inpatient  Remains inpatient appropriate because:Inpatient level of  care appropriate due to severity of illness   Dispo: The patient is from: Home              Anticipated d/c is to: Home on 5/6               Patient currently is not medically stable to d/c.   Difficult to place patient No   Consultants:    None  Procedures:  CT A/P, pelvic ultrasound. Antimicrobials:   Anti-infectives (From admission, onward)   None      Subjective: Patient was seen and examined at bedside.  Overnight events noted.   Patient was supposed to be discharged yesterday then she has developed abdominal pain associated with worsening constipation.  Patient was given to tap water enema and had a small bowel movement.   She felt dizzy and weak not strong enough to be discharged.  Objective: Vitals:   12/03/20 2034 12/04/20 0331 12/04/20 0500 12/04/20 1443  BP: (!) 156/83 (!) 159/70  (!) 152/89  Pulse: (!) 57 61  62  Resp: (!) 21 18  20   Temp: 98.4 F (36.9 C) 99 F (37.2 C)  (!) 97.4 F (36.3 C)  TempSrc: Oral Oral  Oral  SpO2: 100% 100%  100%  Weight:   131.7 kg   Height:        Intake/Output Summary (Last 24 hours) at 12/04/2020 1445 Last data filed at 12/04/2020 0612 Gross per 24 hour  Intake --  Output 550 ml  Net -550 ml   Filed Weights   12/02/20 0500 12/03/20 0449 12/04/20 0500  Weight: 129.7 kg 130.7 kg 131.7 kg    Examination:  General exam: Appears calm and comfortable, not in any acute distress. Respiratory system: Clear to auscultation. Respiratory effort normal. Cardiovascular system: S1 & S2 heard, RRR. No JVD, murmurs, rubs, gallops or clicks. No pedal edema. Gastrointestinal system: Abdomen is nondistended, soft and  Mildly tender. No organomegaly or masses felt.  Normal bowel sounds heard. Central nervous system: Alert and oriented. No focal neurological deficits. Extremities: No edema, no cyanosis, no clubbing. Skin: No rashes, lesions or ulcers Psychiatry: Judgement and insight appear normal. Mood & affect appropriate.     Data Reviewed: I have personally reviewed following labs and imaging studies  CBC: Recent Labs  Lab 11/30/20 1420 11/30/20 1438 12/01/20 0510  12/02/20 0510 12/03/20 0458  WBC 13.5*  --  13.6* 9.7 10.7*  NEUTROABS 11.4*  --  10.4*  --   --   HGB 13.3 14.3 12.7 11.6* 13.4  HCT 40.0 42.0 39.1 36.5 42.0  MCV 93.2  --  94.7 97.9 96.1  PLT 245  --  252 203 268   Basic Metabolic Panel: Recent Labs  Lab 11/30/20 1420 11/30/20 1438 12/01/20 0510 12/02/20 0510 12/02/20 1228 12/03/20 0458  NA 136 137 141 138  --  136  K 3.5 3.4* 4.0 3.5  --  3.3*  CL 101 100 104 107  --  108  CO2 22  --  26 23  --  20*  GLUCOSE 308* 320* 344* 156* 462* 127*  BUN 16 15 18 17   --  17  CREATININE 1.14* 1.00 0.87 1.22*  --  1.12*  CALCIUM 8.9  --  9.1 8.6*  --  9.0  MG 2.0  --  2.1 2.2  --  2.3  PHOS  --   --   --  3.1  --  3.0   GFR: Estimated Creatinine Clearance: 65.6 mL/min (A) (  by C-G formula based on SCr of 1.12 mg/dL (H)). Liver Function Tests: Recent Labs  Lab 11/30/20 1420 12/01/20 0510  AST 18 16  ALT 12 14  ALKPHOS 56 54  BILITOT 0.9 0.4  PROT 8.0 7.4  ALBUMIN 3.5 3.4*   No results for input(s): LIPASE, AMYLASE in the last 168 hours. No results for input(s): AMMONIA in the last 168 hours. Coagulation Profile: Recent Labs  Lab 12/01/20 0510  INR 1.2   Cardiac Enzymes: No results for input(s): CKTOTAL, CKMB, CKMBINDEX, TROPONINI in the last 168 hours. BNP (last 3 results) No results for input(s): PROBNP in the last 8760 hours. HbA1C: Recent Labs    12/02/20 0510  HGBA1C 8.3*   CBG: Recent Labs  Lab 12/03/20 1153 12/03/20 1643 12/03/20 2119 12/04/20 0735 12/04/20 1157  GLUCAP 216* 155* 192* 90 245*   Lipid Profile: No results for input(s): CHOL, HDL, LDLCALC, TRIG, CHOLHDL, LDLDIRECT in the last 72 hours. Thyroid Function Tests: No results for input(s): TSH, T4TOTAL, FREET4, T3FREE, THYROIDAB in the last 72 hours. Anemia Panel: No results for input(s): VITAMINB12, FOLATE, FERRITIN, TIBC, IRON, RETICCTPCT in the last 72 hours. Sepsis Labs: No results for input(s): PROCALCITON, LATICACIDVEN in the last  168 hours.  Recent Results (from the past 240 hour(s))  SARS CORONAVIRUS 2 (TAT 6-24 HRS) Nasopharyngeal Nasopharyngeal Swab     Status: None   Collection Time: 11/30/20 10:11 PM   Specimen: Nasopharyngeal Swab  Result Value Ref Range Status   SARS Coronavirus 2 NEGATIVE NEGATIVE Final    Comment: (NOTE) SARS-CoV-2 target nucleic acids are NOT DETECTED.  The SARS-CoV-2 RNA is generally detectable in upper and lower respiratory specimens during the acute phase of infection. Negative results do not preclude SARS-CoV-2 infection, do not rule out co-infections with other pathogens, and should not be used as the sole basis for treatment or other patient management decisions. Negative results must be combined with clinical observations, patient history, and epidemiological information. The expected result is Negative.  Fact Sheet for Patients: SugarRoll.be  Fact Sheet for Healthcare Providers: https://www.woods-mathews.com/  This test is not yet approved or cleared by the [place] FDA and  has been authorized for detection and/or diagnosis of SARS-CoV-2 by FDA under an Emergency Use Authorization (EUA). This EUA will remain  in effect (meaning this test can be used) for the duration of the COVID-19 declaration under Se ction 564(b)(1) of the Act, 21 U.S.C. section 360bbb-3(b)(1), unless the authorization is terminated or revoked sooner.  Performed at [facility], [place] [address], [place], [place] [address]    Radiology Studies: No results found. Scheduled Meds: . acetaZOLAMIDE  500 mg Oral BID  . atorvastatin  80 mg Oral QPM  . brimonidine  1 drop Both Eyes BID   And  . timolol  1 drop Both Eyes BID  . dicyclomine  10 mg Oral TID AC & HS  . diltiazem  240 mg Oral Daily  . furosemide  120 mg Oral BID  . insulin aspart  0-15 Units Subcutaneous TID WC  . insulin aspart  0-5 Units Subcutaneous QHS  . insulin aspart protamine-  aspart  28 Units Subcutaneous BID WC  . isosorbide mononitrate  120 mg Oral Daily  . latanoprost  1 drop Right Eye QHS  . losartan  100 mg Oral Daily  . metoprolol  200 mg Oral Daily  . pantoprazole  40 mg Oral Daily  . senna-docusate  2 tablet Oral BID   Continuous Infusions: .  sodium chloride 75 mL/hr at 12/04/20 0550     LOS: 2 days    Time spent: 25 mins    Avynn Klassen, MD [facility]   If 7PM-7AM, please contact night-coverage

## 2020-12-04 NOTE — Progress Notes (Signed)
Physical Therapy Treatment Patient Details Name: [name] MRN: [id number] DOB: [birth year] Today's Date: 12/04/2020    History of Present Illness Pt is 65 yo female who presented on 11/30/20 with generalized weakness and c/o N/V.  Per MD weakness likely related to N/V and unable to tolerate P.O. intake and hypokalemia.  Of note, pt also with new L overian mass - to follow up with OB/GYN outpt. Medical hx signficant for chronic iron deficiency anemia, type 2 diabetes mellitus, essential hypertension, chronic diastolic heart failure, stage IIIa chronic kidney disease with a baseline creatinine 1.2-1.3, chronic low back pain, blind R eye, low vision L eye.    PT Comments    Pt with decline in mobility since last treatment.  She was limited due to abdominal pain from constipation (has received meds and enema).  She was only able to ambulate 8' with RW and min A very slowly.  Pt moaning in pain throughout session.  She also had c/o constant dizziness but unable to further assess due to pt cannot tolerate due to pain.  Pt with less progress than expected - updated recommendation to SNF.  Unsure if pt will agree with SNF placement.     Follow Up Recommendations  SNF     Equipment Recommendations  Other (comment) (tub bench)    Recommendations for Other Services       Precautions / Restrictions Precautions Precautions: Fall    Mobility  Bed Mobility Overal bed mobility: Needs Assistance Bed Mobility: Supine to Sit     Supine to sit: Mod assist     General bed mobility comments: Mod A to lift trunk    Transfers Overall transfer level: Needs assistance Equipment used: Rolling walker (2 wheeled) Transfers: Sit to/from Stand Sit to Stand: Min assist         General transfer comment: Performed x 2; cues for safe hand placement  Ambulation/Gait Ambulation/Gait assistance: Min assist Gait Distance (Feet): 8 Feet Assistive device: Rolling walker (2 wheeled) Gait  Pattern/deviations: Step-to pattern;Decreased stride length;Shuffle Gait velocity: decreased Gait velocity interpretation: <1.8 ft/sec, indicate of risk for recurrent falls General Gait Details: Chair follow with very slow gait.  Cues for RW proximity, unsteady , fatigued easily, limited by pain and dizziness   Stairs             Wheelchair Mobility    Modified Rankin (Stroke Patients Only)       Balance Overall balance assessment: Needs assistance Sitting-balance support: Bilateral upper extremity supported Sitting balance-Leahy Scale: Poor Sitting balance - Comments: requiring UE support     Standing balance-Leahy Scale: Poor Standing balance comment: requiring RW                            Cognition Arousal/Alertness: Lethargic Behavior During Therapy: Flat affect Overall Cognitive Status: Impaired/Different from baseline                                 General Comments: Pt internally distracted by pain requiring repetition, significant increase in time to respond, and cues for safety      Exercises      General Comments General comments (skin integrity, edema, etc.): Pt with constipation - has been given medication and enema but still no BM.  Encouraged movement/ambulation to assist with bowel function.  Pt with moaning and grimacing throughout session due to abd pain.  She  also had c/o dizziness that was constant.  However, again not able to test dizziness due to pt unable to tolerate and internally distracted by pain/not following commands consistently.      Pertinent Vitals/Pain Pain Assessment: Faces Faces Pain Scale: Hurts whole lot Pain Location: stomach - constipated Pain Descriptors / Indicators: Moaning;Grimacing;Discomfort Pain Intervention(s): Limited activity within patient's tolerance;Monitored during session;Repositioned (encouraged mobility to assist with bowel function)    Home Living                       Prior Function            PT Goals (current goals can now be found in the care plan section) Acute Rehab PT Goals Patient Stated Goal: return home PT Goal Formulation: With patient Time For Goal Achievement: 12/15/20 Potential to Achieve Goals: Good Progress towards PT goals: Not progressing toward goals - comment (limited progress due to pain)    Frequency    Min 3X/week      PT Plan Discharge plan needs to be updated    Co-evaluation              AM-PAC PT "6 Clicks" Mobility   Outcome Measure  Help needed turning from your back to your side while in a flat bed without using bedrails?: A Little Help needed moving from lying on your back to sitting on the side of a flat bed without using bedrails?: A Lot Help needed moving to and from a bed to a chair (including a wheelchair)?: A Lot Help needed standing up from a chair using your arms (e.g., wheelchair or bedside chair)?: A Little Help needed to walk in hospital room?: A Lot Help needed climbing 3-5 steps with a railing? : Total 6 Click Score: 13    End of Session Equipment Utilized During Treatment: Gait belt Activity Tolerance: Patient limited by pain Patient left: with chair alarm set;in chair;with call bell/phone within reach Nurse Communication: Mobility status PT Visit Diagnosis: Other abnormalities of gait and mobility (R26.89);Muscle weakness (generalized) (M62.81);Dizziness and giddiness (R42)     Time: 1310-1346 PT Time Calculation (min) (ACUTE ONLY): 36 min  Charges:  $Gait Training: 8-22 mins $Therapeutic Activity: 8-22 mins                     Abran Richard, PT Acute Rehab Services Pager [phone] [facility] [place] 12/04/2020, 2:21 PM

## 2020-12-05 ENCOUNTER — Inpatient Hospital Stay (HOSPITAL_COMMUNITY): Payer: Medicare Other

## 2020-12-05 ENCOUNTER — Encounter (HOSPITAL_COMMUNITY): Payer: Self-pay | Admitting: Internal Medicine

## 2020-12-05 DIAGNOSIS — R531 Weakness: Secondary | ICD-10-CM | POA: Diagnosis not present

## 2020-12-05 DIAGNOSIS — R109 Unspecified abdominal pain: Secondary | ICD-10-CM | POA: Diagnosis not present

## 2020-12-05 LAB — GLUCOSE, CAPILLARY
Glucose-Capillary: 227 mg/dL — ABNORMAL HIGH (ref 70–99)
Glucose-Capillary: 230 mg/dL — ABNORMAL HIGH (ref 70–99)
Glucose-Capillary: 187 mg/dL — ABNORMAL HIGH (ref 70–99)
Glucose-Capillary: 176 mg/dL — ABNORMAL HIGH (ref 70–99)

## 2020-12-05 LAB — COMPREHENSIVE METABOLIC PANEL
ALT: 14 U/L (ref 0–44)
AST: 13 U/L — ABNORMAL LOW (ref 15–41)
Albumin: 2.9 g/dL — ABNORMAL LOW (ref 3.5–5.0)
Alkaline Phosphatase: 54 U/L (ref 38–126)
Anion gap: 13 (ref 5–15)
BUN: 17 mg/dL (ref 8–23)
CO2: 15 mmol/L — ABNORMAL LOW (ref 22–32)
Calcium: 8.9 mg/dL (ref 8.9–10.3)
Chloride: 109 mmol/L (ref 98–111)
Creatinine, Ser: 1.22 mg/dL — ABNORMAL HIGH (ref 0.44–1.00)
GFR, Estimated: 49 mL/min — ABNORMAL LOW (ref >60)
Glucose, Bld: 225 mg/dL — ABNORMAL HIGH (ref 70–99)
Potassium: 3.3 mmol/L — ABNORMAL LOW (ref 3.5–5.1)
Sodium: 137 mmol/L (ref 135–145)
Total Bilirubin: 0.6 mg/dL (ref 0.3–1.2)
Total Protein: 7.1 g/dL (ref 6.5–8.1)

## 2020-12-05 LAB — CBC
HCT: 39.8 % (ref 36.0–46.0)
Hemoglobin: 13.3 g/dL (ref 12.0–15.0)
MCH: 31 pg (ref 26.0–34.0)
MCHC: 33.4 g/dL (ref 30.0–36.0)
MCV: 92.8 fL (ref 80.0–100.0)
Platelets: 238 10*3/uL (ref 150–400)
RBC: 4.29 MIL/uL (ref 3.87–5.11)
RDW: 12.5 % (ref 11.5–15.5)
WBC: 13.4 10*3/uL — ABNORMAL HIGH (ref 4.0–10.5)
nRBC: 0 % (ref 0.0–0.2)

## 2020-12-05 LAB — METHYLMALONIC ACID, SERUM: Methylmalonic Acid, Quantitative: 203 nmol/L (ref 0–378)

## 2020-12-05 LAB — MAGNESIUM: Magnesium: 2.1 mg/dL (ref 1.7–2.4)

## 2020-12-05 LAB — PHOSPHORUS: Phosphorus: 3.7 mg/dL (ref 2.5–4.6)

## 2020-12-05 MED ORDER — POTASSIUM CHLORIDE 20 MEQ PO PACK
40.0000 meq | PACK | Freq: Once | ORAL | Status: AC
  Administered 2020-12-05: 40 meq via ORAL
  Filled 2020-12-05: qty 2

## 2020-12-05 MED ORDER — ENOXAPARIN SODIUM 60 MG/0.6ML IJ SOSY
60.0000 mg | PREFILLED_SYRINGE | INTRAMUSCULAR | Status: DC
  Administered 2020-12-05 – 2020-12-10 (×6): 60 mg via SUBCUTANEOUS
  Filled 2020-12-05 (×6): qty 0.6

## 2020-12-05 MED ORDER — MEGESTROL ACETATE 40 MG PO TABS
40.0000 mg | ORAL_TABLET | Freq: Two times a day (BID) | ORAL | Status: DC
  Administered 2020-12-05 – 2020-12-10 (×11): 40 mg via ORAL
  Filled 2020-12-05 (×12): qty 1

## 2020-12-05 NOTE — Care Management Important Message (Signed)
Important Message  Patient Details IM Letter given to the Patient. Name: [name] MRN: [id number] Date of Birth: Jul 26, 1956   Medicare Important Message Given:  Yes     Kerin Salen 12/05/2020, 11:32 AM

## 2020-12-05 NOTE — Progress Notes (Signed)
Pt in chair.  RN will put in new consult when pt is in the bed.

## 2020-12-05 NOTE — Progress Notes (Addendum)
I attempted to get patient from [facility] to chair using the front wheel walker. Patient wasn't able to complete task w/ 1 assist. I asked another nurse Lovie Macadamia, RN) to come help. Apolonio Schneiders and I explained to patient the task and that we were going to stand up and pivot to the chair. patient verbalized understanding of task. When we assisted patient to a standing position she was able to take 1-2 steps toward the [facility]. Appeared steady enough to complete the transfer with 2 assist at this time. Patient took half a step towards the chair, after that she wasn't able to pick up her right foot. Almost immediately, patient's left leg began to give out. At this point, we assisted patient to the floor. Her buttocks came into contact with the floor as we sat her down. No injuries appreciated. No change in mental status. Vital signs all stable-see flowsheet. We were able to get patient in the chair at this time. Dr. Dwyane Dee notified. Patient's spouse called. post fall flowsheet filled out. Will continue to monitor patient. High fall risk protocol in place. Post fall huddle completed w/ Keenan Bachelor, RN and Lovie Macadamia, RN.

## 2020-12-05 NOTE — Progress Notes (Signed)
PROGRESS NOTE    [name]  ZOX:[id number] DOB: 1956/02/24 DOA: 11/30/2020 PCP: Ladell Pier, MD    Brief Narrative: This 65 years old female with PMH significant for chronic iron deficiency anemia, type 2 diabetes, essential hypertension, chronic diastolic CHF,  CKD stage IIIa with a baseline creatinine of 1.2-1.3, chronic back pain presenting the ED with generalized weakness, lower abdominal pain associated with nausea and vomiting. Patient also reported intermittent vaginal bleeding for last 1 week. CT abdomen and pelvis showed no acute abdominal or pelvic pathology but it showed complex cystic left ovarian mass. Pelvic ultrasound confirms the same.  ED physician has spoken with OB/GYN who recommended checking CA125 level,  outpatient oncology and OB/GYN follow-up.  Patient underwent biopsy report is pending.  Urology recommended no acute intervention needed.  Patient renal functions are improving.  Patient was supposed to be discharged 5/4 but then has developed abdominal pain with severe constipation.  Assessment & Plan:   Principal Problem:   Generalized weakness Active Problems:   Essential hypertension   Prolonged Q-T interval on ECG   Controlled type 2 diabetes mellitus with complication, with long-term current use of insulin (HCC)   Nausea & vomiting   Ovarian mass, left   Hypokalemia   Leukocytosis   Chronic diastolic heart failure (HCC)   Generalized weakness:  This could be due to dehydration in the context of recent nausea and vomiting and inability to take p.o..   Denies any focal neurological deficits. No evidence of any infectious process. UA : No infection, CXR : No acute abnormality  Continue IV hydration, PT and OT evaluation Patient has very low p.o. intake,  we will continue IV hydration. PT reassessment recommended SNF.  Nausea and vomiting: Continue Zofran as needed for nausea and vomiting. CT abdomen and pelvis did not show any acute abdominal/  pelvic pathology.  Moderate left hydroureteronephrosis to the level of the left adnexal mass without an obstructing lesion. Urology has recommended no acute intervention needed at this point.  Prolonged QTc interval: Repeat EKG shows normal QTC,   magnesium and other electrolytes normal.  Left ovarian mass: She is found to have left ovarian mass on CT abdomen and pelvis as well as pelvic ultrasound. Case was discussed with on call OB/GYN on-call and oncologist recommended outpatient oncology clinic and GYN follow-up. CA125 > 234-235.  Patient underwent biopsy by Gyn oncologist , report is pending  Type 2 diabetes:  Moderate dose sliding scale,  hold oral hypoglycemic agents.   Continue NovoLog 70/30 mix  28 units twice daily. Blood sugars improving and better controlled.  Essential hypertension: Continue Toprol, Imdur and losartan.  Chronic diastolic CHF: Recent echocardiogram in June 21,  LVEF 65 to 70% with no evidence of focal wall motion abnormalities.   Continue Lasix 120 mg p.o. twice daily.  Patient does not seem to have exacerbation.  CKD stage IIIb :   Serum creatinine slightly up today to 1.22. Avoid nephrotoxic medications,  serum creatinine at baseline. Moderate left hydroureteronephrosis to the level of the left adnexal mass without an obstructing lesion. Discussed with urologist recommended outpatient follow-up.  Constipation: Patient had worsening constipation.  She was given Senokot, tapwater enema twice had a little bowel movement and still feels bloated. Start Dulcolax daily until she has a bowel movement. Xray Abdomen : No bowel obstruction with moderate stool burden.   DVT prophylaxis:  Heparin Code Status: Full code. Family Communication: No family at bed side. Disposition Plan:  Status is:  Inpatient  Remains inpatient appropriate because:Inpatient level of care appropriate due to severity of illness   Dispo: The patient is from: Home               Anticipated d/c is to: Home on 5/7              Patient currently is not medically stable to d/c.   Difficult to place patient No   Consultants:    None  Procedures:  CT A/P, pelvic ultrasound. Antimicrobials:   Anti-infectives (From admission, onward)   None      Subjective: Patient was seen and examined at bedside.  Overnight events noted.   Patient reports having bowel movement today, feels relieved but still feels dizzy and weak,    She refuses to be discharged to SNF, states she has husband to take care of her.  Objective: Vitals:   12/05/20 0500 12/05/20 1101 12/05/20 1316 12/05/20 1532  BP:  (!) 169/73 121/63 (!) 125/54  Pulse:  65 62 60  Resp:   18 18  Temp:   97.7 F (36.5 C) 97.8 F (36.6 C)  TempSrc:   Oral Oral  SpO2:   100% 100%  Weight: 131 kg     Height:        Intake/Output Summary (Last 24 hours) at 12/05/2020 1701 Last data filed at 12/05/2020 1201 Gross per 24 hour  Intake --  Output 850 ml  Net -850 ml   Filed Weights   12/03/20 0449 12/04/20 0500 12/05/20 0500  Weight: 130.7 kg 131.7 kg 131 kg    Examination:  General exam: Appears calm and comfortable, not in any acute distress. Respiratory system: Clear to auscultation. Respiratory effort normal. Cardiovascular system: S1 & S2 heard, RRR. No JVD, murmurs, rubs, gallops or clicks. No pedal edema. Gastrointestinal system: Abdomen is nondistended, soft and tender. No organomegaly or masses felt.  Normal bowel sounds heard. Central nervous system: Alert and oriented. No focal neurological deficits. Extremities: No edema, no cyanosis, no clubbing. Skin: No rashes, lesions or ulcers Psychiatry: Judgement and insight appear normal. Mood & affect appropriate.     Data Reviewed: I have personally reviewed following labs and imaging studies  CBC: Recent Labs  Lab 11/30/20 1420 11/30/20 1438 12/01/20 0510 12/02/20 0510 12/03/20 0458 12/05/20 0418  WBC 13.5*  --  13.6* 9.7 10.7* 13.4*   NEUTROABS 11.4*  --  10.4*  --   --   --   HGB 13.3 14.3 12.7 11.6* 13.4 13.3  HCT 40.0 42.0 39.1 36.5 42.0 39.8  MCV 93.2  --  94.7 97.9 96.1 92.8  PLT 245  --  252 203 240 854   Basic Metabolic Panel: Recent Labs  Lab 11/30/20 1420 11/30/20 1438 12/01/20 0510 12/02/20 0510 12/02/20 1228 12/03/20 0458 12/05/20 0418  NA 136 137 141 138  --  136 137  K 3.5 3.4* 4.0 3.5  --  3.3* 3.3*  CL 101 100 104 107  --  108 109  CO2 22  --  26 23  --  20* 15*  GLUCOSE 308* 320* 344* 156* 462* 127* 225*  BUN 16 15 18 17   --  17 17  CREATININE 1.14* 1.00 0.87 1.22*  --  1.12* 1.22*  CALCIUM 8.9  --  9.1 8.6*  --  9.0 8.9  MG 2.0  --  2.1 2.2  --  2.3 2.1  PHOS  --   --   --  3.1  --  3.0  3.7   GFR: Estimated Creatinine Clearance: 60 mL/min (A) (by C-G formula based on SCr of 1.22 mg/dL (H)). Liver Function Tests: Recent Labs  Lab 11/30/20 1420 12/01/20 0510 12/05/20 0418  AST 18 16 13*  ALT 12 14 14   ALKPHOS 56 54 54  BILITOT 0.9 0.4 0.6  PROT 8.0 7.4 7.1  ALBUMIN 3.5 3.4* 2.9*   No results for input(s): LIPASE, AMYLASE in the last 168 hours. No results for input(s): AMMONIA in the last 168 hours. Coagulation Profile: Recent Labs  Lab 12/01/20 0510  INR 1.2   Cardiac Enzymes: No results for input(s): CKTOTAL, CKMB, CKMBINDEX, TROPONINI in the last 168 hours. BNP (last 3 results) No results for input(s): PROBNP in the last 8760 hours. HbA1C: No results for input(s): HGBA1C in the last 72 hours. CBG: Recent Labs  Lab 12/04/20 1815 12/04/20 2137 12/05/20 0716 12/05/20 1155 12/05/20 1631  GLUCAP 271* 256* 227* 230* 187*   Lipid Profile: No results for input(s): CHOL, HDL, LDLCALC, TRIG, CHOLHDL, LDLDIRECT in the last 72 hours. Thyroid Function Tests: No results for input(s): TSH, T4TOTAL, FREET4, T3FREE, THYROIDAB in the last 72 hours. Anemia Panel: No results for input(s): VITAMINB12, FOLATE, FERRITIN, TIBC, IRON, RETICCTPCT in the last 72 hours. Sepsis  Labs: No results for input(s): PROCALCITON, LATICACIDVEN in the last 168 hours.  Recent Results (from the past 240 hour(s))  SARS CORONAVIRUS 2 (TAT 6-24 HRS) Nasopharyngeal Nasopharyngeal Swab     Status: None   Collection Time: 11/30/20 10:11 PM   Specimen: Nasopharyngeal Swab  Result Value Ref Range Status   SARS Coronavirus 2 NEGATIVE NEGATIVE Final    Comment: (NOTE) SARS-CoV-2 target nucleic acids are NOT DETECTED.  The SARS-CoV-2 RNA is generally detectable in upper and lower respiratory specimens during the acute phase of infection. Negative results do not preclude SARS-CoV-2 infection, do not rule out co-infections with other pathogens, and should not be used as the sole basis for treatment or other patient management decisions. Negative results must be combined with clinical observations, patient history, and epidemiological information. The expected result is Negative.  Fact Sheet for Patients: SugarRoll.be  Fact Sheet for Healthcare Providers: https://www.woods-mathews.com/  This test is not yet approved or cleared by the [place] FDA and  has been authorized for detection and/or diagnosis of SARS-CoV-2 by FDA under an Emergency Use Authorization (EUA). This EUA will remain  in effect (meaning this test can be used) for the duration of the COVID-19 declaration under Se ction 564(b)(1) of the Act, 21 U.S.C. section 360bbb-3(b)(1), unless the authorization is terminated or revoked sooner.  Performed at [facility], [place] [address] [place], [place] [address]    Radiology Studies: DG Abd 1 View  Result Date: 12/05/2020 CLINICAL DATA:  Abdominal pain EXAM: ABDOMEN - 1 VIEW COMPARISON:  CT abdomen and pelvis Nov 30, 2020 FINDINGS: Moderate stool present in colon. There is no bowel dilatation or air-fluid level to suggest bowel obstruction. No free air. Lung bases clear. No abnormal calcifications. IMPRESSION: No bowel  obstruction or free air.  Moderate stool in colon. Electronically Signed   By: Lowella Grip III M.D.   On: 12/05/2020 15:34   Scheduled Meds: . acetaZOLAMIDE  500 mg Oral BID  . atorvastatin  80 mg Oral QPM  . brimonidine  1 drop Both Eyes BID   And  . timolol  1 drop Both Eyes BID  . dicyclomine  10 mg Oral TID AC & HS  . diltiazem  240  mg Oral Daily  . enoxaparin (LOVENOX) injection  60 mg Subcutaneous Q24H  . furosemide  120 mg Oral BID  . insulin aspart  0-15 Units Subcutaneous TID WC  . insulin aspart  0-5 Units Subcutaneous QHS  . insulin aspart protamine- aspart  28 Units Subcutaneous BID WC  . isosorbide mononitrate  120 mg Oral Daily  . latanoprost  1 drop Right Eye QHS  . losartan  100 mg Oral Daily  . megestrol  40 mg Oral BID  . metoprolol  200 mg Oral Daily  . pantoprazole  40 mg Oral Daily  . senna-docusate  2 tablet Oral BID   Continuous Infusions: . sodium chloride 75 mL/hr at 12/05/20 0637     LOS: 3 days    Time spent: 25 mins    Adelle Zachar, MD [facility]   If 7PM-7AM, please contact night-coverage

## 2020-12-05 NOTE — TOC Progression Note (Signed)
Transition of Care [facility]) - Progression Note    Patient Details  Name: [name] MRN: [id number] Date of Birth: May 25, 1956  Transition of Care [facility]) CM/SW [place], [place] Phone Number: 12/05/2020, 3:44 PM  Clinical Narrative:   Patient seen in follow up to change in PT recommendation to SNF.  Found Ms Hopson to be in significant pain, poor appetite, but steadfast in her desire to return home and not go to SNF.  "My husband is retired from [employer], he is home all the time and he will make sure I have the help I need."  Spoke with husband, who confirms that he supports her in her desire to come home.  Furthermore, states there are family members close by that can help if needed, there is a wheelchair at home, and he is checking with the [facility] to see if there are any available home supports.  Plan remains to return home at d/c [facility] will continue to follow during the course of hospitalization.     Expected Discharge Plan: [place] Barriers to Discharge: No Barriers Identified  Expected Discharge Plan and Services Expected Discharge Plan: [place]   Discharge Planning Services: CM Consult Post Acute Care Choice: Home Health,Durable Medical Equipment Living arrangements for the past 2 months: Apartment Expected Discharge Date: 12/03/20                                     Social Determinants of Health (SDOH) Interventions    Readmission Risk Interventions No flowsheet data found.

## 2020-12-06 DIAGNOSIS — R531 Weakness: Secondary | ICD-10-CM | POA: Diagnosis not present

## 2020-12-06 LAB — CBC
HCT: 38 % (ref 36.0–46.0)
Hemoglobin: 12.3 g/dL (ref 12.0–15.0)
MCH: 30.5 pg (ref 26.0–34.0)
MCHC: 32.4 g/dL (ref 30.0–36.0)
MCV: 94.3 fL (ref 80.0–100.0)
Platelets: 249 10*3/uL (ref 150–400)
RBC: 4.03 MIL/uL (ref 3.87–5.11)
RDW: 12.7 % (ref 11.5–15.5)
WBC: 13.5 10*3/uL — ABNORMAL HIGH (ref 4.0–10.5)
nRBC: 0 % (ref 0.0–0.2)

## 2020-12-06 LAB — BASIC METABOLIC PANEL
Anion gap: 12 (ref 5–15)
BUN: 25 mg/dL — ABNORMAL HIGH (ref 8–23)
CO2: 17 mmol/L — ABNORMAL LOW (ref 22–32)
Calcium: 8.7 mg/dL — ABNORMAL LOW (ref 8.9–10.3)
Chloride: 110 mmol/L (ref 98–111)
Creatinine, Ser: 1.71 mg/dL — ABNORMAL HIGH (ref 0.44–1.00)
GFR, Estimated: 33 mL/min — ABNORMAL LOW (ref >60)
Glucose, Bld: 106 mg/dL — ABNORMAL HIGH (ref 70–99)
Potassium: 3.1 mmol/L — ABNORMAL LOW (ref 3.5–5.1)
Sodium: 139 mmol/L (ref 135–145)

## 2020-12-06 LAB — GLUCOSE, CAPILLARY
Glucose-Capillary: 145 mg/dL — ABNORMAL HIGH (ref 70–99)
Glucose-Capillary: 159 mg/dL — ABNORMAL HIGH (ref 70–99)
Glucose-Capillary: 165 mg/dL — ABNORMAL HIGH (ref 70–99)
Glucose-Capillary: 162 mg/dL — ABNORMAL HIGH (ref 70–99)

## 2020-12-06 MED ORDER — POTASSIUM CHLORIDE 20 MEQ PO PACK
40.0000 meq | PACK | Freq: Once | ORAL | Status: AC
  Administered 2020-12-06: 40 meq via ORAL
  Filled 2020-12-06: qty 2

## 2020-12-06 NOTE — Progress Notes (Addendum)
PROGRESS NOTE    [place]  ERD:[id number] DOB: [birth year] DOA: 11/30/2020 PCP: Ladell Pier, MD    Brief Narrative: This 65 years old female with PMH significant for chronic iron deficiency anemia, type 2 diabetes, essential hypertension, chronic diastolic CHF,  CKD stage IIIa with a baseline creatinine of 1.2-1.3, chronic back pain presenting the ED with generalized weakness, lower abdominal pain associated with nausea and vomiting. Patient also reported intermittent vaginal bleeding for last 1 week. CT abdomen and pelvis showed no acute abdominal or pelvic pathology but it showed complex cystic left ovarian mass. Pelvic ultrasound confirms the same.  OB/GYN consulted.  CEA 125 : 234. Patient underwent Endometrial biopsy report is pending.  OB/GYN recommended outpatient follow-up.  Urology recommended no acute intervention needed for mild hydronephrosis.  Patient renal functions are improving.  Patient was discharged on 5/4.  She has developed significant dizziness associated worsening abdominal pain.  Discharge was canceled.  PT recommended a skilled nursing facility.  Patient was initially refusing but now agreeable.  Assessment & Plan:   Principal Problem:   Generalized weakness Active Problems:   Essential hypertension   Prolonged Q-T interval on ECG   Controlled type 2 diabetes mellitus with complication, with long-term current use of insulin (HCC)   Nausea & vomiting   Ovarian mass, left   Hypokalemia   Leukocytosis   Chronic diastolic heart failure (HCC)   Generalized weakness:  This could be due to dehydration in the context of recent nausea and vomiting and inability to take p.o..   Denies any focal neurological deficits. No evidence of any infectious process. UA : No infection, CXR : No acute abnormality  Continue IV hydration, PT and OT evaluation Patient has very low p.o. intake,  we will continue IV hydration. PT reassessment recommended SNF.  Nausea and  vomiting: Continue Zofran as needed for nausea and vomiting. CT abdomen and pelvis did not show any acute abdominal/ pelvic pathology.  Moderate left hydroureteronephrosis to the level of the left adnexal mass without an obstructing lesion. Urology has recommended no acute intervention needed at this point.  Prolonged QTc interval: Repeat EKG shows normal QTC,   magnesium and other electrolytes normal.  Left ovarian mass: She is found to have left ovarian mass on CT abdomen and pelvis as well as pelvic ultrasound. Case was discussed with on call OB/GYN on-call and oncologist recommended outpatient oncology clinic and GYN follow-up. CA125 > 234-235.  Patient underwent endometrial biopsy by Gyn oncologist , report is pending  Type 2 diabetes:  Moderate dose sliding scale,  hold oral hypoglycemic agents.   Continue NovoLog 70/30 mix  28 units twice daily. Blood sugars improving and better controlled.  Essential hypertension: Continue Toprol, Imdur and losartan.  Chronic diastolic CHF: Recent echocardiogram in June 21,  LVEF 65 to 70% with no evidence of focal wall motion abnormalities.   Continue Lasix 120 mg p.o. twice daily.  Patient does not seem to have exacerbation.  CKD stage IIIb :   Serum creatinine remains stable 1.12 -1.22 Avoid nephrotoxic medications,  serum creatinine at baseline. Moderate left hydroureteronephrosis to the level of the left adnexal mass without an obstructing lesion. Discussed with urologist recommended outpatient follow-up.  Constipation: Patient had worsening constipation.  She was given Senokot, tapwater enema twice had a little bowel movement and still feels bloated. Continue Dulcolax until have a bowel movement Xray Abdomen : No bowel obstruction with moderate stool burden.   DVT prophylaxis:  Heparin Code Status:  Full code. Family Communication: No family at bed side. Disposition Plan:  Status is: Inpatient  Remains inpatient appropriate  because:Inpatient level of care appropriate due to severity of illness   Dispo: The patient is from: Home              Anticipated d/c is to: SNF              Patient currently is not medically stable to d/c.   Difficult to place patient No   PT recommended a skilled nursing facility but patient has been refusing.  She finally agreed to go to nursing home.   Consultants:    None  Procedures:  CT A/P, pelvic ultrasound. Antimicrobials:   Anti-infectives (From admission, onward)   None      Subjective: Patient was seen and examined at bedside.  Overnight events noted.   Patient reports having big bowel movement today, feels relieved but still feels dizzy and weak,    She finally agreeable for SNF placement.  Objective: Vitals:   12/05/20 2106 12/06/20 0426 12/06/20 0446 12/06/20 1400  BP: 122/64  127/64 111/80  Pulse: (!) 58  74 65  Resp: (!) 24  20 16   Temp: 98.2 F (36.8 C)  98.3 F (36.8 C) 98.1 F (36.7 C)  TempSrc: Oral  Oral   SpO2: 100%  100% 98%  Weight:  133 kg    Height:        Intake/Output Summary (Last 24 hours) at 12/06/2020 1530 Last data filed at 12/06/2020 1300 Gross per 24 hour  Intake 960 ml  Output --  Net 960 ml   Filed Weights   12/04/20 0500 12/05/20 0500 12/06/20 0426  Weight: 131.7 kg 131 kg 133 kg    Examination:  General exam: Appears calm and comfortable, not in any acute distress. Respiratory system: Clear to auscultation. Respiratory effort normal. Cardiovascular system: S1 & S2 heard, RRR. No JVD, murmurs, rubs, gallops or clicks. No pedal edema. Gastrointestinal system: Abdomen is nondistended, soft and mildly tender. No organomegaly or masses felt.  Normal bowel sounds heard. Central nervous system: Alert and oriented. No focal neurological deficits. Extremities: No edema, no cyanosis, no clubbing. Skin: No rashes, lesions or ulcers Psychiatry: Judgement and insight appear normal. Mood & affect appropriate.     Data  Reviewed: I have personally reviewed following labs and imaging studies  CBC: Recent Labs  Lab 11/30/20 1420 11/30/20 1438 12/01/20 0510 12/02/20 0510 12/03/20 0458 12/05/20 0418 12/06/20 0535  WBC 13.5*  --  13.6* 9.7 10.7* 13.4* 13.5*  NEUTROABS 11.4*  --  10.4*  --   --   --   --   HGB 13.3   < > 12.7 11.6* 13.4 13.3 12.3  HCT 40.0   < > 39.1 36.5 42.0 39.8 38.0  MCV 93.2  --  94.7 97.9 96.1 92.8 94.3  PLT 245  --  252 203 240 238 249   < > = values in this interval not displayed.   Basic Metabolic Panel: Recent Labs  Lab 11/30/20 1420 11/30/20 1438 12/01/20 0510 12/02/20 0510 12/02/20 1228 12/03/20 0458 12/05/20 0418 12/06/20 0535  NA 136   < > 141 138  --  136 137 139  K 3.5   < > 4.0 3.5  --  3.3* 3.3* 3.1*  CL 101   < > 104 107  --  108 109 110  CO2 22  --  26 23  --  20* 15* 17*  GLUCOSE  308*   < > 344* 156* 462* 127* 225* 106*  BUN 16   < > 18 17  --  17 17 25*  CREATININE 1.14*   < > 0.87 1.22*  --  1.12* 1.22* 1.71*  CALCIUM 8.9  --  9.1 8.6*  --  9.0 8.9 8.7*  MG 2.0  --  2.1 2.2  --  2.3 2.1  --   PHOS  --   --   --  3.1  --  3.0 3.7  --    < > = values in this interval not displayed.   GFR: Estimated Creatinine Clearance: 43.2 mL/min (A) (by C-G formula based on SCr of 1.71 mg/dL (H)). Liver Function Tests: Recent Labs  Lab 11/30/20 1420 12/01/20 0510 12/05/20 0418  AST 18 16 13*  ALT 12 14 14   ALKPHOS 56 54 54  BILITOT 0.9 0.4 0.6  PROT 8.0 7.4 7.1  ALBUMIN 3.5 3.4* 2.9*   No results for input(s): LIPASE, AMYLASE in the last 168 hours. No results for input(s): AMMONIA in the last 168 hours. Coagulation Profile: Recent Labs  Lab 12/01/20 0510  INR 1.2   Cardiac Enzymes: No results for input(s): CKTOTAL, CKMB, CKMBINDEX, TROPONINI in the last 168 hours. BNP (last 3 results) No results for input(s): PROBNP in the last 8760 hours. HbA1C: No results for input(s): HGBA1C in the last 72 hours. CBG: Recent Labs  Lab 12/05/20 1155  12/05/20 1631 12/05/20 2105 12/06/20 0749 12/06/20 1205  GLUCAP 230* 187* 176* 145* 159*   Lipid Profile: No results for input(s): CHOL, HDL, LDLCALC, TRIG, CHOLHDL, LDLDIRECT in the last 72 hours. Thyroid Function Tests: No results for input(s): TSH, T4TOTAL, FREET4, T3FREE, THYROIDAB in the last 72 hours. Anemia Panel: No results for input(s): VITAMINB12, FOLATE, FERRITIN, TIBC, IRON, RETICCTPCT in the last 72 hours. Sepsis Labs: No results for input(s): PROCALCITON, LATICACIDVEN in the last 168 hours.  Recent Results (from the past 240 hour(s))  SARS CORONAVIRUS 2 (TAT 6-24 HRS) Nasopharyngeal Nasopharyngeal Swab     Status: None   Collection Time: 11/30/20 10:11 PM   Specimen: Nasopharyngeal Swab  Result Value Ref Range Status   SARS Coronavirus 2 NEGATIVE NEGATIVE Final    Comment: (NOTE) SARS-CoV-2 target nucleic acids are NOT DETECTED.  The SARS-CoV-2 RNA is generally detectable in upper and lower respiratory specimens during the acute phase of infection. Negative results do not preclude SARS-CoV-2 infection, do not rule out co-infections with other pathogens, and should not be used as the sole basis for treatment or other patient management decisions. Negative results must be combined with clinical observations, patient history, and epidemiological information. The expected result is Negative.  Fact Sheet for Patients: SugarRoll.be  Fact Sheet for Healthcare Providers: https://www.woods-mathews.com/  This test is not yet approved or cleared by the [place] FDA and  has been authorized for detection and/or diagnosis of SARS-CoV-2 by FDA under an Emergency Use Authorization (EUA). This EUA will remain  in effect (meaning this test can be used) for the duration of the COVID-19 declaration under Se ction 564(b)(1) of the Act, 21 U.S.C. section 360bbb-3(b)(1), unless the authorization is terminated or revoked  sooner.  Performed at [facility], [place] [address], [place],  [address]    Radiology Studies: DG Abd 1 View  Result Date: 12/05/2020 CLINICAL DATA:  Abdominal pain EXAM: ABDOMEN - 1 VIEW COMPARISON:  CT abdomen and pelvis Nov 30, 2020 FINDINGS: Moderate stool present in colon. There is no  bowel dilatation or air-fluid level to suggest bowel obstruction. No free air. Lung bases clear. No abnormal calcifications. IMPRESSION: No bowel obstruction or free air.  Moderate stool in colon. Electronically Signed   By: Lowella Grip III M.D.   On: 12/05/2020 15:34   Scheduled Meds: . acetaZOLAMIDE  500 mg Oral BID  . atorvastatin  80 mg Oral QPM  . brimonidine  1 drop Both Eyes BID   And  . timolol  1 drop Both Eyes BID  . dicyclomine  10 mg Oral TID AC & HS  . diltiazem  240 mg Oral Daily  . enoxaparin (LOVENOX) injection  60 mg Subcutaneous Q24H  . furosemide  120 mg Oral BID  . insulin aspart  0-15 Units Subcutaneous TID WC  . insulin aspart  0-5 Units Subcutaneous QHS  . insulin aspart protamine- aspart  28 Units Subcutaneous BID WC  . isosorbide mononitrate  120 mg Oral Daily  . latanoprost  1 drop Right Eye QHS  . losartan  100 mg Oral Daily  . megestrol  40 mg Oral BID  . metoprolol  200 mg Oral Daily  . pantoprazole  40 mg Oral Daily  . senna-docusate  2 tablet Oral BID   Continuous Infusions: . sodium chloride 75 mL/hr at 12/05/20 2232     LOS: 4 days    Time spent: 25 mins    Dara Camargo, MD [facility]   If 7PM-7AM, please contact night-coverage

## 2020-12-06 NOTE — Progress Notes (Signed)
Physical Therapy Treatment Patient Details Name: [name] MRN: [id number] DOB: Sep 25, 1955 Today's Date: 12/06/2020    History of Present Illness Pt is 65 yo female who presented on 11/30/20 with generalized weakness and c/o N/V.  Per MD weakness likely related to N/V and unable to tolerate P.O. intake and hypokalemia.  Of note, pt also with new L overian mass - to follow up with OB/GYN outpt. Medical hx signficant for chronic iron deficiency anemia, type 2 diabetes mellitus, essential hypertension, chronic diastolic heart failure, stage IIIa chronic kidney disease with a baseline creatinine 1.2-1.3, chronic low back pain, blind R eye, low vision L eye.    PT Comments    Pt is not progressing with mobility. She is somewhat lethargic. Max A and max cueing for bed mobility. She was unable to sit up at EOB unsupported this a.m. Unable to attempt standing or any further mobility for safety reasons. Pt reports feeling weak and tired. If nursing mobilizes pt, highly recommend +2 assist at this time. Continue to recommend SNF.     Follow Up Recommendations  SNF     Equipment Recommendations   (tub bench; possible PTAR transport if pt returns home-depends on progress)    Recommendations for Other Services       Precautions / Restrictions Precautions Precautions: Fall Restrictions Weight Bearing Restrictions: No    Mobility  Bed Mobility Overal bed mobility: Needs Assistance Bed Mobility: Supine to Sit;Sit to Supine     Supine to sit: HOB elevated;Max assist Sit to supine: HOB elevated;Max assist   General bed mobility comments: Assist for trunk and bil LEs. Utilized HOB and bedpad to assist. Increased time. Cues for safety, technique, full effort. Pt is somewhat lethargic. Pt only sat EOB ~10 seconds before abruptly falling back onto bed. Deferred further activity for safety reasons.    Transfers                 General transfer comment: NT-unable to safety assist 2*  lethargy, weakness.  Ambulation/Gait                 Stairs             Wheelchair Mobility    Modified Rankin (Stroke Patients Only)       Balance Overall balance assessment: Needs assistance   Sitting balance-Leahy Scale: Poor Sitting balance - Comments: unable to maintaing static sitting balance                                    Cognition Arousal/Alertness: Lethargic Behavior During Therapy: Flat affect Overall Cognitive Status: No family/caregiver present to determine baseline cognitive functioning Area of Impairment: Problem solving                             Problem Solving: Requires tactile cues;Requires verbal cues;Difficulty sequencing;Slow processing        Exercises      General Comments        Pertinent Vitals/Pain Pain Assessment: Faces Faces Pain Scale: Hurts little more Pain Location: abdomen Pain Descriptors / Indicators: Moaning;Grimacing;Discomfort Pain Intervention(s): Limited activity within patient's tolerance;Monitored during session;Repositioned    Home Living                      Prior Function            PT  Goals (current goals can now be found in the care plan section) Progress towards PT goals: Not progressing toward goals - comment    Frequency    Min 3X/week      PT Plan Current plan remains appropriate    Co-evaluation              AM-PAC PT "6 Clicks" Mobility   Outcome Measure  Help needed turning from your back to your side while in a flat bed without using bedrails?: A Lot Help needed moving from lying on your back to sitting on the side of a flat bed without using bedrails?: A Lot Help needed moving to and from a bed to a chair (including a wheelchair)?: Total Help needed standing up from a chair using your arms (e.g., wheelchair or bedside chair)?: Total Help needed to walk in hospital room?: Total Help needed climbing 3-5 steps with a railing? :  Total 6 Click Score: 8    End of Session   Activity Tolerance: Patient limited by pain;Patient limited by fatigue Patient left: in bed;with call bell/phone within reach;with bed alarm set   PT Visit Diagnosis: Other abnormalities of gait and mobility (R26.89);Muscle weakness (generalized) (M62.81);Dizziness and giddiness (R42)     Time: 5993-5701 PT Time Calculation (min) (ACUTE ONLY): 14 min  Charges:  $Therapeutic Activity: 8-22 mins                         Doreatha Massed, PT Acute Rehabilitation  Office: [phone] Pager: [phone]

## 2020-12-07 ENCOUNTER — Encounter (HOSPITAL_COMMUNITY): Payer: Self-pay | Admitting: Internal Medicine

## 2020-12-07 ENCOUNTER — Inpatient Hospital Stay (HOSPITAL_COMMUNITY): Payer: Medicare Other

## 2020-12-07 DIAGNOSIS — R531 Weakness: Secondary | ICD-10-CM | POA: Diagnosis not present

## 2020-12-07 DIAGNOSIS — E118 Type 2 diabetes mellitus with unspecified complications: Secondary | ICD-10-CM | POA: Diagnosis not present

## 2020-12-07 DIAGNOSIS — N838 Other noninflammatory disorders of ovary, fallopian tube and broad ligament: Secondary | ICD-10-CM

## 2020-12-07 DIAGNOSIS — R9431 Abnormal electrocardiogram [ECG] [EKG]: Secondary | ICD-10-CM | POA: Diagnosis not present

## 2020-12-07 DIAGNOSIS — I5032 Chronic diastolic (congestive) heart failure: Secondary | ICD-10-CM | POA: Diagnosis not present

## 2020-12-07 DIAGNOSIS — I10 Essential (primary) hypertension: Secondary | ICD-10-CM | POA: Diagnosis not present

## 2020-12-07 DIAGNOSIS — Z794 Long term (current) use of insulin: Secondary | ICD-10-CM | POA: Diagnosis not present

## 2020-12-07 DIAGNOSIS — E876 Hypokalemia: Secondary | ICD-10-CM | POA: Diagnosis not present

## 2020-12-07 LAB — GLUCOSE, CAPILLARY
Glucose-Capillary: 192 mg/dL — ABNORMAL HIGH (ref 70–99)
Glucose-Capillary: 204 mg/dL — ABNORMAL HIGH (ref 70–99)
Glucose-Capillary: 158 mg/dL — ABNORMAL HIGH (ref 70–99)
Glucose-Capillary: 275 mg/dL — ABNORMAL HIGH (ref 70–99)

## 2020-12-07 LAB — CBC
HCT: 40.2 % (ref 36.0–46.0)
Hemoglobin: 13.3 g/dL (ref 12.0–15.0)
MCH: 31 pg (ref 26.0–34.0)
MCHC: 33.1 g/dL (ref 30.0–36.0)
MCV: 93.7 fL (ref 80.0–100.0)
Platelets: 209 10*3/uL (ref 150–400)
RBC: 4.29 MIL/uL (ref 3.87–5.11)
RDW: 13 % (ref 11.5–15.5)
WBC: 11.9 10*3/uL — ABNORMAL HIGH (ref 4.0–10.5)
nRBC: 0 % (ref 0.0–0.2)

## 2020-12-07 LAB — BASIC METABOLIC PANEL
Anion gap: 10 (ref 5–15)
BUN: 20 mg/dL (ref 8–23)
CO2: 16 mmol/L — ABNORMAL LOW (ref 22–32)
Calcium: 8.9 mg/dL (ref 8.9–10.3)
Chloride: 114 mmol/L — ABNORMAL HIGH (ref 98–111)
Creatinine, Ser: 1.43 mg/dL — ABNORMAL HIGH (ref 0.44–1.00)
GFR, Estimated: 40 mL/min — ABNORMAL LOW (ref >60)
Glucose, Bld: 140 mg/dL — ABNORMAL HIGH (ref 70–99)
Potassium: 3.6 mmol/L (ref 3.5–5.1)
Sodium: 140 mmol/L (ref 135–145)

## 2020-12-07 LAB — BLOOD GAS, ARTERIAL
Acid-base deficit: 7.7 mmol/L — ABNORMAL HIGH (ref 0.0–2.0)
Bicarbonate: 14.3 mmol/L — ABNORMAL LOW (ref 20.0–28.0)
Drawn by: 29503
FIO2: 28
O2 Content: 2 L/min
O2 Saturation: 99.5 %
Patient temperature: 98.6
pCO2 arterial: 21 mmHg — ABNORMAL LOW (ref 32.0–48.0)
pH, Arterial: 7.448 (ref 7.350–7.450)
pO2, Arterial: 147 mmHg — ABNORMAL HIGH (ref 83.0–108.0)

## 2020-12-07 LAB — PHOSPHORUS: Phosphorus: 3.3 mg/dL (ref 2.5–4.6)

## 2020-12-07 LAB — MAGNESIUM: Magnesium: 2.3 mg/dL (ref 1.7–2.4)

## 2020-12-07 MED ORDER — SODIUM BICARBONATE 8.4 % IV SOLN
INTRAVENOUS | Status: DC
  Filled 2020-12-07: qty 150

## 2020-12-07 NOTE — Progress Notes (Signed)
Spoke face-to-face with MD regarding pt's lethargy and inability to keep her awake. Have held the Bentyl for today to see if this helps. MD aware.

## 2020-12-07 NOTE — Progress Notes (Signed)
Pt continues to sleep.  Pulse oximetry in place. New IV fluids started as ordered. Pt will respond to voice and then close her eyes in mid conversation. VS remain WNL.  Attempting to see if patient will safely be able to eat dinner.

## 2020-12-07 NOTE — Progress Notes (Signed)
Pt took 4-5 bites of mashed potatoes and one sip of tea. Continued to close her eyes and could not stay awake for her meal.  Food removed from room. Pt with eyes closed, sitting up in bed, continuous pulse ox.

## 2020-12-07 NOTE — Progress Notes (Signed)
RR has left the bedside. Pt continues to be difficult to keep awake. Will continue to monitor closely and await any future orders. Telemetry in progress.

## 2020-12-07 NOTE — Progress Notes (Signed)
Writer called RR at this time as pt is increasingly lethargic with difficulty staying awake. Use of accessory muscle breathing. C/o difficulty breathing by pt and her spouse, who was just at bedside and then left the hospital. Alerted MD via page and secure chat, who arrived abedside. EKG, ABG completed.

## 2020-12-07 NOTE — Progress Notes (Signed)
Progress Note    [name]  YKD:[id number] DOB: January 30, 1956  DOA: 11/30/2020 PCP: Ladell Pier, MD      Brief Narrative:    Medical records reviewed and are as summarized below:  [name] is a 65 y.o. female       Assessment/Plan:   Principal Problem:   Generalized weakness Active Problems:   Essential hypertension   Prolonged Q-T interval on ECG   Controlled type 2 diabetes mellitus with complication, with long-term current use of insulin (HCC)   Nausea & vomiting   Ovarian mass, left   Hypokalemia   Leukocytosis   Chronic diastolic heart failure (HCC)    Body mass index is 50.65 kg/m.  (Morbid obesity) (  Lethargy/altered mental status: Etiology unclear.  No evidence of hypercapnia or hypoxia on ABG that was done today.  CT head ordered today did not show any acute abnormality. EKG (personally reviewed by me) showed normal sinus rhythm, right bundle branch block, PVCs, prolonged QTC at 492(improved from 518). Bentyl has been discontinued.  AKI with metabolic acidosis, CKD stage IIIa: Change IV fluids from normal saline to sodium bicarbonate infusion.  Monitor BMP.  Nausea and vomiting: Improved.  Antiemetics as needed.  Left ovarian mass: Pathology report showed that there is a benign mass.  Follow-up with gynecologist for further recommendations.  Mild left-sided hydronephrosis: Outpatient follow-up with urologist, Dr. Ellison Hughs.  Generalized weakness: PT recommend discharge to SNF.  Follow-up with social worker to assist with disposition.  Diet Order            Diet - low sodium heart healthy           Diet Carb Modified           Diet Carb Modified Fluid consistency: Thin; Room service appropriate? Yes  Diet effective now                    Consultants:  Company secretary  Procedures:  None:    Medications:   . acetaZOLAMIDE  500 mg Oral BID  . atorvastatin  80 mg Oral QPM  . brimonidine  1  drop Both Eyes BID   And  . timolol  1 drop Both Eyes BID  . dicyclomine  10 mg Oral TID AC & HS  . diltiazem  240 mg Oral Daily  . enoxaparin (LOVENOX) injection  60 mg Subcutaneous Q24H  . furosemide  120 mg Oral BID  . insulin aspart  0-15 Units Subcutaneous TID WC  . insulin aspart  0-5 Units Subcutaneous QHS  . insulin aspart protamine- aspart  28 Units Subcutaneous BID WC  . isosorbide mononitrate  120 mg Oral Daily  . latanoprost  1 drop Right Eye QHS  . losartan  100 mg Oral Daily  . megestrol  40 mg Oral BID  . metoprolol  200 mg Oral Daily  . pantoprazole  40 mg Oral Daily  . senna-docusate  2 tablet Oral BID   Continuous Infusions: . sodium bicarbonate 150 mEq in D5W infusion       Anti-infectives (From admission, onward)   None             Family Communication/Anticipated D/C date and plan/Code Status   DVT prophylaxis: SCDs Start: 11/30/20 2218     Code Status: Full Code  Family Communication: None Disposition Plan:    Status is: Inpatient  Remains inpatient appropriate because:Inpatient level of care appropriate due to severity  of illness   Dispo: The patient is from: Home              Anticipated d/c is to: SNF              Patient currently is not medically stable to d/c.   Difficult to place patient No           Subjective:   Interval events noted.  She is unable to provide adequate history.  According to her nurse, she has been drowsy since yesterday.  Objective:    Vitals:   12/07/20 0444 12/07/20 0450 12/07/20 1011 12/07/20 1435  BP:  (!) 149/72  (!) 141/89  Pulse:  70 78 72  Resp:  16  14  Temp:  98.7 F (37.1 C)  98.6 F (37 C)  TempSrc:  Oral    SpO2:  99% 100% 100%  Weight: 129.7 kg     Height:       No data found.   Intake/Output Summary (Last 24 hours) at 12/07/2020 1610 Last data filed at 12/07/2020 0113 Gross per 24 hour  Intake 240 ml  Output 1300 ml  Net -1060 ml   Filed Weights   12/05/20 0500  12/06/20 0426 12/07/20 0444  Weight: 131 kg 133 kg 129.7 kg    Exam:  GEN: NAD SKIN: Warm and dry EYES: EOMI ENT: MMM CV: RRR PULM: CTA B ABD: soft, obese, NT, +BS CNS: Drowsy/lethargic but arousable, non focal EXT: No edema or tenderness         Data Reviewed:   I have personally reviewed following labs and imaging studies:  Labs: Labs show the following:   Basic Metabolic Panel: Recent Labs  Lab 12/01/20 0510 12/02/20 0510 12/02/20 1228 12/03/20 0458 12/05/20 0418 12/06/20 0535 12/07/20 0537  NA 141 138  --  136 137 139 140  K 4.0 3.5  --  3.3* 3.3* 3.1* 3.6  CL 104 107  --  108 109 110 114*  CO2 26 23  --  20* 15* 17* 16*  GLUCOSE 344* 156* 462* 127* 225* 106* 140*  BUN 18 17  --  17 17 25* 20  CREATININE 0.87 1.22*  --  1.12* 1.22* 1.71* 1.43*  CALCIUM 9.1 8.6*  --  9.0 8.9 8.7* 8.9  MG 2.1 2.2  --  2.3 2.1  --  2.3  PHOS  --  3.1  --  3.0 3.7  --  3.3   GFR Estimated Creatinine Clearance: 50.9 mL/min (A) (by C-G formula based on SCr of 1.43 mg/dL (H)). Liver Function Tests: Recent Labs  Lab 12/01/20 0510 12/05/20 0418  AST 16 13*  ALT 14 14  ALKPHOS 54 54  BILITOT 0.4 0.6  PROT 7.4 7.1  ALBUMIN 3.4* 2.9*   No results for input(s): LIPASE, AMYLASE in the last 168 hours. No results for input(s): AMMONIA in the last 168 hours. Coagulation profile Recent Labs  Lab 12/01/20 0510  INR 1.2    CBC: Recent Labs  Lab 12/01/20 0510 12/02/20 0510 12/03/20 0458 12/05/20 0418 12/06/20 0535 12/07/20 0537  WBC 13.6* 9.7 10.7* 13.4* 13.5* 11.9*  NEUTROABS 10.4*  --   --   --   --   --   HGB 12.7 11.6* 13.4 13.3 12.3 13.3  HCT 39.1 36.5 42.0 39.8 38.0 40.2  MCV 94.7 97.9 96.1 92.8 94.3 93.7  PLT 252 203 240 238 249 209   Cardiac Enzymes: No results for input(s): CKTOTAL, CKMB, CKMBINDEX, TROPONINI  in the last 168 hours. BNP (last 3 results) No results for input(s): PROBNP in the last 8760 hours. CBG: Recent Labs  Lab 12/06/20 1205  12/06/20 1634 12/06/20 2103 12/07/20 0805 12/07/20 1202  GLUCAP 159* 165* 162* 192* 204*   D-Dimer: No results for input(s): DDIMER in the last 72 hours. Hgb A1c: No results for input(s): HGBA1C in the last 72 hours. Lipid Profile: No results for input(s): CHOL, HDL, LDLCALC, TRIG, CHOLHDL, LDLDIRECT in the last 72 hours. Thyroid function studies: No results for input(s): TSH, T4TOTAL, T3FREE, THYROIDAB in the last 72 hours.  Invalid input(s): FREET3 Anemia work up: No results for input(s): VITAMINB12, FOLATE, FERRITIN, TIBC, IRON, RETICCTPCT in the last 72 hours. Sepsis Labs: Recent Labs  Lab 12/03/20 0458 12/05/20 0418 12/06/20 0535 12/07/20 0537  WBC 10.7* 13.4* 13.5* 11.9*    Microbiology Recent Results (from the past 240 hour(s))  SARS CORONAVIRUS 2 (TAT 6-24 HRS) Nasopharyngeal Nasopharyngeal Swab     Status: None   Collection Time: 11/30/20 10:11 PM   Specimen: Nasopharyngeal Swab  Result Value Ref Range Status   SARS Coronavirus 2 NEGATIVE NEGATIVE Final    Comment: (NOTE) SARS-CoV-2 target nucleic acids are NOT DETECTED.  The SARS-CoV-2 RNA is generally detectable in upper and lower respiratory specimens during the acute phase of infection. Negative results do not preclude SARS-CoV-2 infection, do not rule out co-infections with other pathogens, and should not be used as the sole basis for treatment or other patient management decisions. Negative results must be combined with clinical observations, patient history, and epidemiological information. The expected result is Negative.  Fact Sheet for Patients: SugarRoll.be  Fact Sheet for Healthcare Providers: https://www.woods-mathews.com/  This test is not yet approved or cleared by the [place] FDA and  has been authorized for detection and/or diagnosis of SARS-CoV-2 by FDA under an Emergency Use Authorization (EUA). This EUA will remain  in effect (meaning  this test can be used) for the duration of the COVID-19 declaration under Se ction 564(b)(1) of the Act, 21 U.S.C. section 360bbb-3(b)(1), unless the authorization is terminated or revoked sooner.  Performed at [facility], [place] [address] [place], [place] [address]     Procedures and diagnostic studies:  No results found.             LOS: 5 days   Jessiah Steinhart  [facility]   Pager on [email or web address]. If 7PM-7AM, please contact night-coverage at [email or web address]     12/07/2020, 4:10 PM

## 2020-12-08 DIAGNOSIS — E876 Hypokalemia: Secondary | ICD-10-CM | POA: Diagnosis not present

## 2020-12-08 DIAGNOSIS — N838 Other noninflammatory disorders of ovary, fallopian tube and broad ligament: Secondary | ICD-10-CM | POA: Diagnosis not present

## 2020-12-08 DIAGNOSIS — R531 Weakness: Secondary | ICD-10-CM | POA: Diagnosis not present

## 2020-12-08 DIAGNOSIS — I5032 Chronic diastolic (congestive) heart failure: Secondary | ICD-10-CM | POA: Diagnosis not present

## 2020-12-08 DIAGNOSIS — E118 Type 2 diabetes mellitus with unspecified complications: Secondary | ICD-10-CM | POA: Diagnosis not present

## 2020-12-08 LAB — CBC WITH DIFFERENTIAL/PLATELET
Abs Immature Granulocytes: 0.03 10*3/uL (ref 0.00–0.07)
Basophils Absolute: 0 10*3/uL (ref 0.0–0.1)
Basophils Relative: 0 %
Eosinophils Absolute: 0 10*3/uL (ref 0.0–0.5)
Eosinophils Relative: 0 %
HCT: 40.7 % (ref 36.0–46.0)
Hemoglobin: 13.4 g/dL (ref 12.0–15.0)
Immature Granulocytes: 0 %
Lymphocytes Relative: 10 %
Lymphs Abs: 0.9 10*3/uL (ref 0.7–4.0)
MCH: 31.1 pg (ref 26.0–34.0)
MCHC: 32.9 g/dL (ref 30.0–36.0)
MCV: 94.4 fL (ref 80.0–100.0)
Monocytes Absolute: 0.8 10*3/uL (ref 0.1–1.0)
Monocytes Relative: 9 %
Neutro Abs: 7.5 10*3/uL (ref 1.7–7.7)
Neutrophils Relative %: 81 %
Platelets: 229 10*3/uL (ref 150–400)
RBC: 4.31 MIL/uL (ref 3.87–5.11)
RDW: 12.9 % (ref 11.5–15.5)
WBC: 9.3 10*3/uL (ref 4.0–10.5)
nRBC: 0 % (ref 0.0–0.2)

## 2020-12-08 LAB — COMPREHENSIVE METABOLIC PANEL
ALT: 14 U/L (ref 0–44)
AST: 13 U/L — ABNORMAL LOW (ref 15–41)
Albumin: 3.1 g/dL — ABNORMAL LOW (ref 3.5–5.0)
Alkaline Phosphatase: 57 U/L (ref 38–126)
Anion gap: 8 (ref 5–15)
BUN: 17 mg/dL (ref 8–23)
CO2: 18 mmol/L — ABNORMAL LOW (ref 22–32)
Calcium: 8.7 mg/dL — ABNORMAL LOW (ref 8.9–10.3)
Chloride: 112 mmol/L — ABNORMAL HIGH (ref 98–111)
Creatinine, Ser: 1.4 mg/dL — ABNORMAL HIGH (ref 0.44–1.00)
GFR, Estimated: 41 mL/min — ABNORMAL LOW (ref >60)
Glucose, Bld: 298 mg/dL — ABNORMAL HIGH (ref 70–99)
Potassium: 3.4 mmol/L — ABNORMAL LOW (ref 3.5–5.1)
Sodium: 138 mmol/L (ref 135–145)
Total Bilirubin: 0.7 mg/dL (ref 0.3–1.2)
Total Protein: 7.7 g/dL (ref 6.5–8.1)

## 2020-12-08 LAB — PROCALCITONIN: Procalcitonin: 0.6 ng/mL

## 2020-12-08 LAB — GLUCOSE, CAPILLARY
Glucose-Capillary: 294 mg/dL — ABNORMAL HIGH (ref 70–99)
Glucose-Capillary: 299 mg/dL — ABNORMAL HIGH (ref 70–99)
Glucose-Capillary: 237 mg/dL — ABNORMAL HIGH (ref 70–99)
Glucose-Capillary: 131 mg/dL — ABNORMAL HIGH (ref 70–99)

## 2020-12-08 LAB — AMMONIA: Ammonia: 35 umol/L (ref 9–35)

## 2020-12-08 LAB — TROPONIN I (HIGH SENSITIVITY): Troponin I (High Sensitivity): 11 ng/L (ref <18)

## 2020-12-08 LAB — LACTIC ACID, PLASMA: Lactic Acid, Venous: 1.3 mmol/L (ref 0.5–1.9)

## 2020-12-08 MED ORDER — SODIUM BICARBONATE 650 MG PO TABS
650.0000 mg | ORAL_TABLET | Freq: Three times a day (TID) | ORAL | Status: AC
  Administered 2020-12-08 – 2020-12-10 (×6): 650 mg via ORAL
  Filled 2020-12-08 (×6): qty 1

## 2020-12-08 NOTE — Progress Notes (Signed)
Physical Therapy Treatment Patient Details Name: [name] MRN: [id number] DOB: 19-Sep-1955 Today's Date: 12/08/2020    History of Present Illness Pt is 65 yo female who presented on 11/30/20 with generalized weakness and c/o N/V.  Per MD weakness likely related to N/V and unable to tolerate P.O. intake and hypokalemia.  Of note, pt also with new L overian mass - to follow up with OB/GYN outpt. Medical hx signficant for chronic iron deficiency anemia, type 2 diabetes mellitus, essential hypertension, chronic diastolic heart failure, stage IIIa chronic kidney disease with a baseline creatinine 1.2-1.3, chronic low back pain, blind R eye, low vision L eye.    PT Comments    Pt is still sleepy but arouses to voice.  She is oriented to self and hospital, cannot recall Elvina Sidle (" I know it is not [facility]"). Overall improvement from last session (able to stand today). pt requires +2 max assist to sit EOB, +2 min assist to stand and maximum cues and encouragement. Pt states " I just want to sleep". Returned pt to supine, unable to wt shift to take steps or pivot to chair safely with assist of 2. Recommend SNF  Continue PT POC  Follow Up Recommendations  SNF     Equipment Recommendations  Other (comment) (has DME)    Recommendations for Other Services       Precautions / Restrictions Precautions Precautions: Fall Restrictions Weight Bearing Restrictions: No    Mobility  Bed Mobility Overal bed mobility: Needs Assistance Bed Mobility: Supine to Sit;Sit to Supine     Supine to sit: +2 for physical assistance;Mod assist;Max assist;+2 for safety/equipment Sit to supine: Mod assist;+2 for physical assistance;+2 for safety/equipment;Max assist   General bed mobility comments: Assist for trunk and bil LEs. Utilized HOB and bedpad to assist. Increased time. Cues for safety, technique, full effort. Pt is states she is sleepy, wants to lie down immediately after sitting. therapist had pt scoot  to EOB and remain sitting    Transfers Overall transfer level: Needs assistance Equipment used: Rolling walker (2 wheeled) Transfers: Sit to/from Stand Sit to Stand: Min assist;+2 physical assistance;+2 safety/equipment         General transfer comment: assist to rise and stabilize. pt is unable to wt shift to take steps in standing  Ambulation/Gait             General Gait Details: unable   Stairs             Wheelchair Mobility    Modified Rankin (Stroke Patients Only)       Balance   Sitting-balance support: Bilateral upper extremity supported;Feet supported Sitting balance-Leahy Scale: Fair     Standing balance support: Bilateral upper extremity supported Standing balance-Leahy Scale: Poor Standing balance comment: reliant on UEs and external support                            Cognition Arousal/Alertness: Lethargic (arouses, very sleepy) Behavior During Therapy: Flat affect Overall Cognitive Status: Impaired/Different from baseline Area of Impairment: Orientation;Attention;Following commands;Problem solving                 Orientation Level: Disoriented to;Time;Situation Current Attention Level: Sustained   Following Commands: Follows one step commands with increased time;Follows multi-step commands inconsistently     Problem Solving: Requires tactile cues;Requires verbal cues;Difficulty sequencing;Slow processing        Exercises      General Comments  Pertinent Vitals/Pain Pain Assessment: No/denies pain    Home Living                      Prior Function            PT Goals (current goals can now be found in the care plan section) Acute Rehab PT Goals Patient Stated Goal: return home PT Goal Formulation: With patient Time For Goal Achievement: 12/15/20 Potential to Achieve Goals: Good Progress towards PT goals: Progressing toward goals    Frequency    Min 3X/week      PT Plan  Current plan remains appropriate    Co-evaluation              AM-PAC PT "6 Clicks" Mobility   Outcome Measure  Help needed turning from your back to your side while in a flat bed without using bedrails?: A Lot Help needed moving from lying on your back to sitting on the side of a flat bed without using bedrails?: Total Help needed moving to and from a bed to a chair (including a wheelchair)?: Total Help needed standing up from a chair using your arms (e.g., wheelchair or bedside chair)?: A Lot Help needed to walk in hospital room?: Total Help needed climbing 3-5 steps with a railing? : Total 6 Click Score: 8    End of Session Equipment Utilized During Treatment: Gait belt Activity Tolerance: Patient limited by lethargy;Patient limited by fatigue Patient left: in bed;with call bell/phone within reach;with bed alarm set Nurse Communication: Mobility status PT Visit Diagnosis: Other abnormalities of gait and mobility (R26.89);Muscle weakness (generalized) (M62.81);Dizziness and giddiness (R42)     Time: 6712-4580 PT Time Calculation (min) (ACUTE ONLY): 17 min  Charges:  $Therapeutic Activity: 8-22 mins                     Baxter Flattery, PT  Acute Rehab Dept [facility]) [phone] Pager [phone]  12/08/2020    [facility] 12/08/2020, 4:50 PM

## 2020-12-08 NOTE — Care Management Important Message (Signed)
Medicare IM printed remotely for Social Work team to give to the patient. 

## 2020-12-08 NOTE — Progress Notes (Addendum)
Progress Note    [name]  IRC:[id number] DOB: 02/26/1956  DOA: 11/30/2020 PCP: Ladell Pier, MD      Brief Narrative:    Medical records reviewed and are as summarized below:  [name] is a 65 y.o. female       Assessment/Plan:   Principal Problem:   Generalized weakness Active Problems:   Essential hypertension   Prolonged Q-T interval on ECG   Controlled type 2 diabetes mellitus with complication, with long-term current use of insulin (HCC)   Nausea & vomiting   Ovarian mass, left   Hypokalemia   Leukocytosis   Chronic diastolic heart failure (HCC)    Body mass index is 51.71 kg/m.  (Morbid obesity) (  Lethargy/altered mental status: Etiology unclear.  No evidence of hypercapnia or hypoxia on ABG. CT head did not show any acute abnormality.  Bentyl was discontinued on 12/07/2020.  Hold Diamox for now.  Ammonia, lactic acid, troponin and procalcitonin levels were all normal today.  Hypokalemia: Replete potassium and monitor levels.  EKG on 12/07/2020 (personally reviewed by me) showed normal sinus rhythm, right bundle branch block, PVCs, prolonged QTC at 492(improved from 518).   AKI with non-anion gap metabolic acidosis, CKD stage IIIa: Sodium bicarbonate infusion has been discontinued.  Start oral sodium bicarbonate.  Monitor BMP.  Hold Diamox for now.  Insulin-dependent diabetes mellitus: Continue NovoLog Mix.  Nausea and vomiting: Improved.  Antiemetics as needed.  Left ovarian mass: Pathology report showed a benign mass.  Follow-up with gynecologist for further recommendations.  Mild left-sided hydronephrosis: Outpatient follow-up with urologist, Dr. Ellison Hughs.  Generalized weakness: PT recommend discharge to SNF.  Follow-up with social worker to assist with disposition.  Legal blindness, diabetic retinopathy, glaucoma: Hold Diamox for now.  Continue current eyedrops    Diet Order            Diet - low sodium heart  healthy           Diet Carb Modified           Diet Carb Modified Fluid consistency: Thin; Room service appropriate? Yes  Diet effective now                    Consultants:  Company secretary  Procedures:  None:    Medications:   . atorvastatin  80 mg Oral QPM  . brimonidine  1 drop Both Eyes BID   And  . timolol  1 drop Both Eyes BID  . dicyclomine  10 mg Oral TID AC & HS  . diltiazem  240 mg Oral Daily  . enoxaparin (LOVENOX) injection  60 mg Subcutaneous Q24H  . furosemide  120 mg Oral BID  . insulin aspart  0-15 Units Subcutaneous TID WC  . insulin aspart  0-5 Units Subcutaneous QHS  . insulin aspart protamine- aspart  28 Units Subcutaneous BID WC  . isosorbide mononitrate  120 mg Oral Daily  . latanoprost  1 drop Right Eye QHS  . losartan  100 mg Oral Daily  . megestrol  40 mg Oral BID  . metoprolol  200 mg Oral Daily  . pantoprazole  40 mg Oral Daily  . senna-docusate  2 tablet Oral BID  . sodium bicarbonate  650 mg Oral TID   Continuous Infusions:    Anti-infectives (From admission, onward)   None             Family Communication/Anticipated D/C date and plan/Code  Status   DVT prophylaxis: SCDs Start: 11/30/20 2218     Code Status: Full Code  Family Communication: None Disposition Plan:    Status is: Inpatient  Remains inpatient appropriate because:Inpatient level of care appropriate due to severity of illness   Dispo: The patient is from: Home              Anticipated d/c is to: SNF              Patient currently is not medically stable to d/c.   Difficult to place patient No           Subjective:   Interval events noted.  Her nurse was at the bedside during this encounter.  Patient has no complaints.  No shortness of breath, cough or chest pain.  She said she normally sleeps around 11 PM at night and wakes up around 9 AM in the morning.  She stated during the day, she spends most of her time watching TV at  home.  She said she has no history of excessive daytime sleepiness.  Objective:    Vitals:   12/07/20 2115 12/08/20 0500 12/08/20 0515 12/08/20 1030  BP: (!) 150/66  (!) 141/57 (!) 167/83  Pulse: 75  69 71  Resp: (!) 24  20   Temp: 98.2 F (36.8 C)  98.8 F (37.1 C)   TempSrc: Oral  Oral   SpO2: 100%  99%   Weight:  132.4 kg    Height:       No data found.   Intake/Output Summary (Last 24 hours) at 12/08/2020 1354 Last data filed at 12/08/2020 0500 Gross per 24 hour  Intake 1230 ml  Output 425 ml  Net 805 ml   Filed Weights   12/06/20 0426 12/07/20 0444 12/08/20 0500  Weight: 133 kg 129.7 kg 132.4 kg    Exam:  GEN: NAD SKIN: Warm and dry EYES: No pallor or icterus ENT: MMM CV: RRR PULM: CTA B ABD: soft, obese, NT, +BS CNS: Drowsy but arousable and more alert today compared to yesterday.  Non focal EXT: No edema or tenderness        Data Reviewed:   I have personally reviewed following labs and imaging studies:  Labs: Labs show the following:   Basic Metabolic Panel: Recent Labs  Lab 12/02/20 0510 12/02/20 1228 12/03/20 0458 12/05/20 0418 12/06/20 0535 12/07/20 0537 12/08/20 0837  NA 138  --  136 137 139 140 138  K 3.5  --  3.3* 3.3* 3.1* 3.6 3.4*  CL 107  --  108 109 110 114* 112*  CO2 23  --  20* 15* 17* 16* 18*  GLUCOSE 156*   < > 127* 225* 106* 140* 298*  BUN 17  --  17 17 25* 20 17  CREATININE 1.22*  --  1.12* 1.22* 1.71* 1.43* 1.40*  CALCIUM 8.6*  --  9.0 8.9 8.7* 8.9 8.7*  MG 2.2  --  2.3 2.1  --  2.3  --   PHOS 3.1  --  3.0 3.7  --  3.3  --    < > = values in this interval not displayed.   GFR Estimated Creatinine Clearance: 52.7 mL/min (A) (by C-G formula based on SCr of 1.4 mg/dL (H)). Liver Function Tests: Recent Labs  Lab 12/05/20 0418 12/08/20 0837  AST 13* 13*  ALT 14 14  ALKPHOS 54 57  BILITOT 0.6 0.7  PROT 7.1 7.7  ALBUMIN 2.9* 3.1*   No  results for input(s): LIPASE, AMYLASE in the last 168 hours. Recent Labs   Lab 12/08/20 0837  AMMONIA 35   Coagulation profile No results for input(s): INR, PROTIME in the last 168 hours.  CBC: Recent Labs  Lab 12/03/20 0458 12/05/20 0418 12/06/20 0535 12/07/20 0537 12/08/20 0837  WBC 10.7* 13.4* 13.5* 11.9* 9.3  NEUTROABS  --   --   --   --  7.5  HGB 13.4 13.3 12.3 13.3 13.4  HCT 42.0 39.8 38.0 40.2 40.7  MCV 96.1 92.8 94.3 93.7 94.4  PLT 240 238 249 209 229   Cardiac Enzymes: No results for input(s): CKTOTAL, CKMB, CKMBINDEX, TROPONINI in the last 168 hours. BNP (last 3 results) No results for input(s): PROBNP in the last 8760 hours. CBG: Recent Labs  Lab 12/07/20 1202 12/07/20 1643 12/07/20 2119 12/08/20 0805 12/08/20 1155  GLUCAP 204* 158* 275* 294* 299*   D-Dimer: No results for input(s): DDIMER in the last 72 hours. Hgb A1c: No results for input(s): HGBA1C in the last 72 hours. Lipid Profile: No results for input(s): CHOL, HDL, LDLCALC, TRIG, CHOLHDL, LDLDIRECT in the last 72 hours. Thyroid function studies: No results for input(s): TSH, T4TOTAL, T3FREE, THYROIDAB in the last 72 hours.  Invalid input(s): FREET3 Anemia work up: No results for input(s): VITAMINB12, FOLATE, FERRITIN, TIBC, IRON, RETICCTPCT in the last 72 hours. Sepsis Labs: Recent Labs  Lab 12/05/20 0418 12/06/20 0535 12/07/20 0537 12/08/20 0837  PROCALCITON  --   --   --  0.60  WBC 13.4* 13.5* 11.9* 9.3  LATICACIDVEN  --   --   --  1.3    Microbiology Recent Results (from the past 240 hour(s))  SARS CORONAVIRUS 2 (TAT 6-24 HRS) Nasopharyngeal Nasopharyngeal Swab     Status: None   Collection Time: 11/30/20 10:11 PM   Specimen: Nasopharyngeal Swab  Result Value Ref Range Status   SARS Coronavirus 2 NEGATIVE NEGATIVE Final    Comment: (NOTE) SARS-CoV-2 target nucleic acids are NOT DETECTED.  The SARS-CoV-2 RNA is generally detectable in upper and lower respiratory specimens during the acute phase of infection. Negative results do not preclude  SARS-CoV-2 infection, do not rule out co-infections with other pathogens, and should not be used as the sole basis for treatment or other patient management decisions. Negative results must be combined with clinical observations, patient history, and epidemiological information. The expected result is Negative.  Fact Sheet for Patients: SugarRoll.be  Fact Sheet for Healthcare Providers: https://www.woods-mathews.com/  This test is not yet approved or cleared by the [place] FDA and  has been authorized for detection and/or diagnosis of SARS-CoV-2 by FDA under an Emergency Use Authorization (EUA). This EUA will remain  in effect (meaning this test can be used) for the duration of the COVID-19 declaration under Se ction 564(b)(1) of the Act, 21 U.S.C. section 360bbb-3(b)(1), unless the authorization is terminated or revoked sooner.  Performed at [facility], [place] [address] [place], [place] [address]     Procedures and diagnostic studies:  CT HEAD WO CONTRAST  Result Date: 12/07/2020 CLINICAL DATA:  Altered mental status.  Delirium. EXAM: CT HEAD WITHOUT CONTRAST TECHNIQUE: Contiguous axial images were obtained from the base of the skull through the vertex without intravenous contrast. COMPARISON:  02/08/2016 FINDINGS: Brain: Mild generalized atrophy. Chronic small-vessel ischemic changes of the hemispheric white matter. No sign of acute infarction, mass lesion, hemorrhage, hydrocephalus or extra-axial collection. Changes are progressive since 2017. Vascular: There is atherosclerotic calcification of the  major vessels at the base of the brain. Skull: Negative Sinuses/Orbits: Clear/normal Other: None IMPRESSION: No acute or reversible finding. Atrophy and chronic small-vessel ischemic changes, progressive since 2017. Electronically Signed   By: Nelson Chimes M.D.   On: 12/07/2020 16:17               LOS: 6 days   Kelii Chittum  [facility]   Pager on [email or web address]. If 7PM-7AM, please contact night-coverage at [email or web address]     12/08/2020, 1:54 PM

## 2020-12-08 NOTE — TOC Progression Note (Signed)
Transition of Care [facility]) - Progression Note    Patient Details  Name: [name] MRN: [id number] Date of Birth: September 07, 1955  Transition [facility] CM/SW [place], [place] Phone Number: 12/08/2020, 3:40 PM  Clinical Narrative:   PT note seen and appreciated that continues to recommend SNF.  Went to speak with patient, found husband bed side.  [name] briefly engaged with me, then closed her eyes and went back to sleep.  Mr Bejar continues to adamantly state that he will take her home at d/c despite my stated concerns about both their safety.I pointed out she is two person assist here, and has increased lethargy.  In response, he states that he can get help if needed, and that this lethargy thing is not a new issue, but something he has been dealing with off and on for awhile now. When offered ambulance transportation home, he replied that he will be fine taking her home, that he has help at the other end getting her in the home. TOC will continue to follow during the course of hospitalization.     Expected Discharge Plan: [place] Barriers to Discharge: No Barriers Identified  Expected Discharge Plan and Services Expected Discharge Plan: [place]   Discharge Planning Services: CM Consult Post Acute Care Choice: Home Health,Durable Medical Equipment Living arrangements for the past 2 months: Apartment Expected Discharge Date: 12/03/20                                     Social Determinants of Health (SDOH) Interventions    Readmission Risk Interventions No flowsheet data found.

## 2020-12-09 ENCOUNTER — Inpatient Hospital Stay (HOSPITAL_COMMUNITY): Payer: Medicare Other

## 2020-12-09 DIAGNOSIS — E876 Hypokalemia: Secondary | ICD-10-CM | POA: Diagnosis not present

## 2020-12-09 DIAGNOSIS — R112 Nausea with vomiting, unspecified: Secondary | ICD-10-CM | POA: Diagnosis not present

## 2020-12-09 DIAGNOSIS — R531 Weakness: Secondary | ICD-10-CM | POA: Diagnosis not present

## 2020-12-09 DIAGNOSIS — R110 Nausea: Secondary | ICD-10-CM | POA: Diagnosis not present

## 2020-12-09 DIAGNOSIS — N838 Other noninflammatory disorders of ovary, fallopian tube and broad ligament: Secondary | ICD-10-CM | POA: Diagnosis not present

## 2020-12-09 DIAGNOSIS — E118 Type 2 diabetes mellitus with unspecified complications: Secondary | ICD-10-CM | POA: Diagnosis not present

## 2020-12-09 DIAGNOSIS — R1110 Vomiting, unspecified: Secondary | ICD-10-CM | POA: Diagnosis not present

## 2020-12-09 DIAGNOSIS — I5032 Chronic diastolic (congestive) heart failure: Secondary | ICD-10-CM | POA: Diagnosis not present

## 2020-12-09 LAB — GLUCOSE, CAPILLARY
Glucose-Capillary: 153 mg/dL — ABNORMAL HIGH (ref 70–99)
Glucose-Capillary: 165 mg/dL — ABNORMAL HIGH (ref 70–99)
Glucose-Capillary: 183 mg/dL — ABNORMAL HIGH (ref 70–99)
Glucose-Capillary: 135 mg/dL — ABNORMAL HIGH (ref 70–99)

## 2020-12-09 LAB — BASIC METABOLIC PANEL
Anion gap: 11 (ref 5–15)
BUN: 18 mg/dL (ref 8–23)
CO2: 17 mmol/L — ABNORMAL LOW (ref 22–32)
Calcium: 8.7 mg/dL — ABNORMAL LOW (ref 8.9–10.3)
Chloride: 108 mmol/L (ref 98–111)
Creatinine, Ser: 1.42 mg/dL — ABNORMAL HIGH (ref 0.44–1.00)
GFR, Estimated: 41 mL/min — ABNORMAL LOW (ref >60)
Glucose, Bld: 140 mg/dL — ABNORMAL HIGH (ref 70–99)
Potassium: 3.2 mmol/L — ABNORMAL LOW (ref 3.5–5.1)
Sodium: 136 mmol/L (ref 135–145)

## 2020-12-09 MED ORDER — POTASSIUM CHLORIDE CRYS ER 20 MEQ PO TBCR
40.0000 meq | EXTENDED_RELEASE_TABLET | Freq: Once | ORAL | Status: AC
  Administered 2020-12-09: 40 meq via ORAL
  Filled 2020-12-09: qty 2

## 2020-12-09 NOTE — Progress Notes (Addendum)
Progress Note    [name]  NOM:[id number] DOB: 1955/12/22  DOA: 11/30/2020 PCP: Ladell Pier, MD      Brief Narrative:    Medical records reviewed and are as summarized below:  [name] is a 65 y.o. female with past medical history significant for iron deficiency anemia, type II DM, diabetic retinopathy, glaucoma, hypertension, chronic diastolic CHF, CKD stage IIIa, chronic back pain, who presented to the hospital with nausea, vomiting, abdominal pain, generalized weakness.  She was treated with IV fluids, analgesics and antiemetics.  She was found to have complex cystic left ovarian mass.  Gynecologist was consulted and patient underwent ovarian biopsy.  Pathology report showed a benign ovarian mass.  She was evaluated by PT and OT who recommended further rehabilitation at a skilled nursing facility.  However, she is reluctant to go to SNF.  She is also concerned that she will not be able to manage at home.      Assessment/Plan:   Principal Problem:   Generalized weakness Active Problems:   Essential hypertension   Prolonged Q-T interval on ECG   Controlled type 2 diabetes mellitus with complication, with long-term current use of insulin (HCC)   Nausea & vomiting   Ovarian mass, left   Hypokalemia   Leukocytosis   Chronic diastolic heart failure (HCC)    Body mass index is 50.61 kg/m.  (Morbid obesity)   Lethargy/altered mental status: Improved.  Etiology unclear.  No evidence of hypercapnia or hypoxia on ABG. CT head did not show any acute abnormality.  Bentyl was discontinued on 12/07/2020.  Diamox has also been held.  Ammonia, lactic acid, troponin and procalcitonin levels were all normal today.  Cough and shortness of breath reported today: Chest x-ray did not show any acute abnormality.  She is tolerating room air.  Hypokalemia: Replete potassium and monitor levels.  EKG on 12/07/2020 (personally reviewed by me) showed normal sinus rhythm,  right bundle branch block, PVCs, prolonged QTC at 492(improved from 518).   AKI with non-anion gap metabolic acidosis, CKD stage IIIa: Continue oral sodium bicarbonate.  Monitor BMP.  Insulin-dependent diabetes mellitus: Continue NovoLog Mix.  Nausea and vomiting: Improved.  Antiemetics as needed.  Diarrhea: Likely from laxatives.  Discontinue Senokot.  Left ovarian mass: Pathology report showed a benign mass.  Follow-up with gynecologist for further recommendations.  Mild left-sided hydronephrosis: Outpatient follow-up with urologist, Dr. Ellison Hughs.  Generalized weakness: PT recommend discharge to SNF.  Follow-up with social worker to assist with disposition.  Legal blindness, diabetic retinopathy, glaucoma: Hold Diamox for now.  Continue current eyedrops  Disposition: Patient refuses to go to SNF and prefers to go home despite safety concerns.  She requires 2 people to assist her with her basic needs.  I told her I was going to discharge her home today since she did not want to go to SNF.  However, patient told me that she cannot even get up and walk and was wondering how she was going to manage at home.  Now, she wants to discuss SNF options with social worker again.  Follow-up with social worker to assist with disposition.    Diet Order            Diet - low sodium heart healthy           Diet Carb Modified           Diet Carb Modified Fluid consistency: Thin; Room service appropriate? Yes  Diet effective  now                    Consultants:  Company secretary  Procedures:  None:    Medications:   . atorvastatin  80 mg Oral QPM  . brimonidine  1 drop Both Eyes BID   And  . timolol  1 drop Both Eyes BID  . dicyclomine  10 mg Oral TID AC & HS  . diltiazem  240 mg Oral Daily  . enoxaparin (LOVENOX) injection  60 mg Subcutaneous Q24H  . furosemide  120 mg Oral BID  . insulin aspart  0-15 Units Subcutaneous TID WC  . insulin aspart  0-5 Units  Subcutaneous QHS  . insulin aspart protamine- aspart  28 Units Subcutaneous BID WC  . isosorbide mononitrate  120 mg Oral Daily  . latanoprost  1 drop Right Eye QHS  . losartan  100 mg Oral Daily  . megestrol  40 mg Oral BID  . metoprolol  200 mg Oral Daily  . pantoprazole  40 mg Oral Daily  . potassium chloride  40 mEq Oral Once  . senna-docusate  2 tablet Oral BID  . sodium bicarbonate  650 mg Oral TID   Continuous Infusions:    Anti-infectives (From admission, onward)   None             Family Communication/Anticipated D/C date and plan/Code Status   DVT prophylaxis: SCDs Start: 11/30/20 2218     Code Status: Full Code  Family Communication: None Disposition Plan:    Status is: Inpatient  Remains inpatient appropriate because:Unsafe d/c plan   Dispo: The patient is from: Home              Anticipated d/c is to: SNF              Patient currently is not medically stable to d/c.   Difficult to place patient No           Subjective:   C/o cough and shortness of breath.  She also complained of diarrhea. No chest pain or vomiting  Objective:    Vitals:   12/09/20 0454 12/09/20 0500 12/09/20 1027 12/09/20 1316  BP: (!) 159/70  (!) 175/72 (!) 118/48  Pulse: 69  71 70  Resp: 18   18  Temp: 98.9 F (37.2 C)   (!) 97.3 F (36.3 C)  TempSrc: Oral   Oral  SpO2: 100%   100%  Weight:  129.6 kg    Height:       No data found.   Intake/Output Summary (Last 24 hours) at 12/09/2020 1609 Last data filed at 12/09/2020 0515 Gross per 24 hour  Intake 120 ml  Output 800 ml  Net -680 ml   Filed Weights   12/07/20 0444 12/08/20 0500 12/09/20 0500  Weight: 129.7 kg 132.4 kg 129.6 kg    Exam:  GEN: NAD SKIN: Warm and dry EYES: No pallor or icterus ENT: MMM CV: RRR PULM: CTA B ABD: soft, obese, NT, +BS CNS: AAO x 3, non focal EXT: No edema or tenderness         Data Reviewed:   I have personally reviewed following labs and imaging  studies:  Labs: Labs show the following:   Basic Metabolic Panel: Recent Labs  Lab 12/03/20 0458 12/05/20 0418 12/06/20 0535 12/07/20 0537 12/08/20 0837 12/09/20 0520  NA 136 137 139 140 138 136  K 3.3* 3.3* 3.1* 3.6 3.4* 3.2*  CL 108 109  110 114* 112* 108  CO2 20* 15* 17* 16* 18* 17*  GLUCOSE 127* 225* 106* 140* 298* 140*  BUN 17 17 25* 20 17 18   CREATININE 1.12* 1.22* 1.71* 1.43* 1.40* 1.42*  CALCIUM 9.0 8.9 8.7* 8.9 8.7* 8.7*  MG 2.3 2.1  --  2.3  --   --   PHOS 3.0 3.7  --  3.3  --   --    GFR Estimated Creatinine Clearance: 51.2 mL/min (A) (by C-G formula based on SCr of 1.42 mg/dL (H)). Liver Function Tests: Recent Labs  Lab 12/05/20 0418 12/08/20 0837  AST 13* 13*  ALT 14 14  ALKPHOS 54 57  BILITOT 0.6 0.7  PROT 7.1 7.7  ALBUMIN 2.9* 3.1*   No results for input(s): LIPASE, AMYLASE in the last 168 hours. Recent Labs  Lab 12/08/20 0837  AMMONIA 35   Coagulation profile No results for input(s): INR, PROTIME in the last 168 hours.  CBC: Recent Labs  Lab 12/03/20 0458 12/05/20 0418 12/06/20 0535 12/07/20 0537 12/08/20 0837  WBC 10.7* 13.4* 13.5* 11.9* 9.3  NEUTROABS  --   --   --   --  7.5  HGB 13.4 13.3 12.3 13.3 13.4  HCT 42.0 39.8 38.0 40.2 40.7  MCV 96.1 92.8 94.3 93.7 94.4  PLT 240 238 249 209 229   Cardiac Enzymes: No results for input(s): CKTOTAL, CKMB, CKMBINDEX, TROPONINI in the last 168 hours. BNP (last 3 results) No results for input(s): PROBNP in the last 8760 hours. CBG: Recent Labs  Lab 12/08/20 1155 12/08/20 1635 12/08/20 2133 12/09/20 0758 12/09/20 1148  GLUCAP 299* 237* 131* 153* 165*   D-Dimer: No results for input(s): DDIMER in the last 72 hours. Hgb A1c: No results for input(s): HGBA1C in the last 72 hours. Lipid Profile: No results for input(s): CHOL, HDL, LDLCALC, TRIG, CHOLHDL, LDLDIRECT in the last 72 hours. Thyroid function studies: No results for input(s): TSH, T4TOTAL, T3FREE, THYROIDAB in the last 72  hours.  Invalid input(s): FREET3 Anemia work up: No results for input(s): VITAMINB12, FOLATE, FERRITIN, TIBC, IRON, RETICCTPCT in the last 72 hours. Sepsis Labs: Recent Labs  Lab 12/05/20 0418 12/06/20 0535 12/07/20 0537 12/08/20 0837  PROCALCITON  --   --   --  0.60  WBC 13.4* 13.5* 11.9* 9.3  LATICACIDVEN  --   --   --  1.3    Microbiology Recent Results (from the past 240 hour(s))  SARS CORONAVIRUS 2 (TAT 6-24 HRS) Nasopharyngeal Nasopharyngeal Swab     Status: None   Collection Time: 11/30/20 10:11 PM   Specimen: Nasopharyngeal Swab  Result Value Ref Range Status   SARS Coronavirus 2 NEGATIVE NEGATIVE Final    Comment: (NOTE) SARS-CoV-2 target nucleic acids are NOT DETECTED.  The SARS-CoV-2 RNA is generally detectable in upper and lower respiratory specimens during the acute phase of infection. Negative results do not preclude SARS-CoV-2 infection, do not rule out co-infections with other pathogens, and should not be used as the sole basis for treatment or other patient management decisions. Negative results must be combined with clinical observations, patient history, and epidemiological information. The expected result is Negative.  Fact Sheet for Patients: SugarRoll.be  Fact Sheet for Healthcare Providers: https://www.woods-mathews.com/  This test is not yet approved or cleared by the [place] FDA and  has been authorized for detection and/or diagnosis of SARS-CoV-2 by FDA under an Emergency Use Authorization (EUA). This EUA will remain  in effect (meaning this test can be used) for  the duration of the COVID-19 declaration under Se ction 564(b)(1) of the Act, 21 U.S.C. section 360bbb-3(b)(1), unless the authorization is terminated or revoked sooner.  Performed at [facility], [place] [address], [place], [place] [address]     Procedures and diagnostic studies:  CT HEAD WO CONTRAST  Result Date:  12/07/2020 CLINICAL DATA:  Altered mental status.  Delirium. EXAM: CT HEAD WITHOUT CONTRAST TECHNIQUE: Contiguous axial images were obtained from the base of the skull through the vertex without intravenous contrast. COMPARISON:  02/08/2016 FINDINGS: Brain: Mild generalized atrophy. Chronic small-vessel ischemic changes of the hemispheric white matter. No sign of acute infarction, mass lesion, hemorrhage, hydrocephalus or extra-axial collection. Changes are progressive since 2017. Vascular: There is atherosclerotic calcification of the major vessels at the base of the brain. Skull: Negative Sinuses/Orbits: Clear/normal Other: None IMPRESSION: No acute or reversible finding. Atrophy and chronic small-vessel ischemic changes, progressive since 2017. Electronically Signed   By: Nelson Chimes M.D.   On: 12/07/2020 16:17   DG CHEST PORT 1 VIEW  Result Date: 12/09/2020 CLINICAL DATA:  Generalized weakness, nausea, vomiting. EXAM: PORTABLE CHEST 1 VIEW COMPARISON:  Chest radiograph dated 01/23/2020. FINDINGS: The heart size and mediastinal contours are within normal limits. Both lungs are clear. Degenerative changes are seen in the spine. IMPRESSION: No active disease. Electronically Signed   By: Zerita Boers M.D.   On: 12/09/2020 11:24               LOS: 7 days   Jessicia Napolitano  [facility]   Pager on [email or web address]. If 7PM-7AM, please contact night-coverage at [email or web address]     12/09/2020, 4:09 PM

## 2020-12-10 LAB — GLUCOSE, CAPILLARY: Glucose-Capillary: 165 mg/dL — ABNORMAL HIGH (ref 70–99)

## 2020-12-10 MED ORDER — ACETAZOLAMIDE ER 500 MG PO CP12: 500.0000 mg | ORAL_CAPSULE | Freq: Two times a day (BID) | ORAL | 3 refills | Status: DC

## 2020-12-10 NOTE — Progress Notes (Signed)
Went over discharge instructions w/ pt. Pt verbalized understanding.  

## 2020-12-10 NOTE — Discharge Summary (Signed)
Physician Discharge Summary  CLAUDENE GATLIFF MBW:[id number] DOB: March 24, 1956 DOA: 11/30/2020  PCP: Ladell Pier, MD  Admit date: 11/30/2020 Discharge date: 12/10/2020  Admitted From: Home  Disposition:  Home with [facility]   Recommendations for Outpatient Follow-up:  1. Follow up with PCP Dr. Wynetta Emery in 1 week 2. Follow up with Dr. Denman George, GYN Onc tomorrow for ovarian mass 3. Follow up with Dr. Gilford Rile, Urology, for hydronephrosis 4. Dr. Wynetta Emery, please recheck BMP in 1 week        Home Health: PT/OT due to weakness and imbalance  Equipment/Devices: Tub bench  Discharge Condition: Declining  CODE STATUS: FULL      Brief/Interim Summary: Mrs. Struve is a 65 y.o. F with DM, HTN, dCHF, CKD IIIa, and IDA who presented with lower abdominal pain, vomiting, poor PO intake.  CT showed a thickened endometrium, a left ovarian mass, and some mild left sided hydronephrosis.  Gyn oncology and urology were consulted.       PRINCIPAL HOSPITAL DIAGNOSIS: Failure to thrive    Discharge Diagnoses:  Weakness due to failure to thrive Patient has chronic weakness, worse lately in context of ovarian mass and poor PO intake and chronic acidosis.  Blood gases were unremarkable, CT head unremarkable.  Ammonia, lactic acid, troponin and procalcitonin levels were all normal.  Paitent was recommended to discharge to SNF, husband declined, felt she was at baseline and not rehab-able.     Hypokalemia Resolved with treatment   AKI on CKD IIIa  Non-anion gap metabolic acidosis Baseline Cr 0.9-1.2 here up to 1.7, improved with fluids.    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  Endometrial hyperplasia Underwent biopsy in hospital, negative for malignancy.  Left ovarian mass Patient at present not felt to be surgical candidate by GYN Oncology.  She will follow up tomorrow with GYN Onc, for further discussion.   Mild left-sided  hydronephrosis This is uncertain clinical significant, seems to be asymptomatic.  Follow up with Urology as an outpatient.  Legal blindness, diabetic retinopathy, glaucoma         Discharge Instructions  Discharge Instructions    Call MD for:  difficulty breathing, headache or visual disturbances   Complete by: As directed    Call MD for:  persistant dizziness or light-headedness   Complete by: As directed    Call MD for:  persistant nausea and vomiting   Complete by: As directed    Diet - low sodium heart healthy   Complete by: As directed    Diet Carb Modified   Complete by: As directed    Discharge instructions   Complete by: As directed    You were admitted for abdominal pain  Here, we found that you have an ovarian mass and thickening of your uterus (your womb).  Dr. Denman George, the cancer oncologist, did a biopsy of the uterus, and found that it was not cancer. That does not mean the ovarian mass is not cancer, it is unclear what the ovarian mass is. You must follow up with Dr. Denman George tomorrow for this (information on that appointment is below)   For your bladder, you should follow up with Dr. Gilford Rile, from Urology His contact info is below, call his office to schedule an appointment in the next month  Call your primary care doctor for a follow up appointment too  No changes to any of your other medicines is necessary.  We sent refills of your Acetazolamide and Dilacor since you  had run out   Increase activity slowly   Complete by: As directed      Allergies as of 12/03/2020      Reactions   Ativan [lorazepam] Anaphylaxis   Orange Fruit [citrus] Anaphylaxis, Other (See Comments)   Pt stated throat swelling, itching      Medication List    STOP taking these medications   dapagliflozin propanediol 5 MG Tabs tablet Commonly known as: Farxiga   Lumigan 0.01 % Soln Generic drug: bimatoprost     TAKE these medications   Accu-Chek Softclix Lancets lancets Use  as instructed for 3 times daily blood glucose monitoring   acetaminophen 500 MG tablet Commonly known as: TYLENOL Take 1 tablet (500 mg total) by mouth every 6 (six) hours as needed. What changed: reasons to take this   acetaZOLAMIDE 500 MG capsule Commonly known as: DIAMOX Take 1 capsule (500 mg total) by mouth 2 (two) times daily.   aspirin EC 81 MG tablet Take 81 mg by mouth daily.   atorvastatin 80 MG tablet Commonly known as: LIPITOR TAKE 1 TABLET BY MOUTH ONCE DAILY IN THE EVENING AT 6  PM What changed:   how much to take  how to take this  when to take this  additional instructions   B-12 1000 MCG Tabs Take 1,000 mcg by mouth daily.   bismuth subsalicylate 644 IH/47QQ suspension Commonly known as: PEPTO BISMOL Take 30 mLs by mouth every 6 (six) hours as needed for indigestion.   Blood Pressure Monitor/Arm Devi 1 each by Does not apply route daily. ICD 10, I10 and I50.32   brimonidine-timolol 0.2-0.5 % ophthalmic solution Commonly known as: COMBIGAN Place 1 drop into both eyes 2 (two) times daily.   calcium carbonate 500 MG chewable tablet Commonly known as: TUMS - dosed in mg elemental calcium Chew 1 tablet by mouth 3 (three) times daily as needed for indigestion.   diltiazem 240 MG 24 hr capsule Commonly known as: DILACOR XR Take 1 capsule (240 mg total) by mouth daily.   ferrous sulfate 325 (65 FE) MG tablet Take 1 tablet (325 mg total) by mouth daily with breakfast.   furosemide 40 MG tablet Commonly known as: LASIX TAKE 1 TABLET BY MOUTH  TWICE DAILY (TAKE WITH  FUROSEMIDE 80 MG TAB) What changed: See the new instructions.   furosemide 80 MG tablet Commonly known as: LASIX TAKE 1 TABLET BY MOUTH TWO  TIMES DAILY WITH FUROSEMIDE 40MG  (TOTAL DOSE OF 120MG  TWO TIMES DAILY ) What changed: See the new instructions.   glucose blood test strip Use TID before meals / Dx E11.9   glucose monitoring kit monitoring kit 1 each by Does not apply route  4 (four) times daily - after meals and at bedtime.   Accu-Chek Aviva Plus w/Device Kit Used as directed   HumaLOG Mix 75/25 (75-25) 100 UNIT/ML Susp injection Generic drug: insulin lispro protamine-lispro INJECT SUBCUTANEOUSLY 50  UNITS TWICE DAILY WITH  MEALS What changed: See the new instructions.   isosorbide mononitrate 120 MG 24 hr tablet Commonly known as: IMDUR TAKE 1 TABLET BY MOUTH ONCE DAILY   latanoprost 0.005 % ophthalmic solution Commonly known as: XALATAN Place 1 drop into the right eye at bedtime.   losartan 100 MG tablet Commonly known as: COZAAR Take 100 mg by mouth daily.   megestrol 40 MG tablet Commonly known as: MEGACE Take 1 tablet by mouth twice daily   metoprolol 200 MG 24 hr tablet Commonly known  as: TOPROL-XL Take 1 tablet by mouth once daily   MUSCLE RUB EX Apply 1 application topically daily as needed (knee pain).   nitroGLYCERIN 0.4 MG SL tablet Commonly known as: NITROSTAT Place 1 tablet (0.4 mg total) under the tongue every 5 (five) minutes as needed for chest pain. Max 3 doses in 15 minutes. <PLEASE MAKE APPOINTMENT FOR REFILLS> What changed: additional instructions   pantoprazole 40 MG tablet Commonly known as: PROTONIX TAKE ONE TABLET BY MOUTH ONCE DAILY   potassium chloride SA 20 MEQ tablet Commonly known as: KLOR-CON TAKE 2 TABLETS BY MOUTH IN  THE MORNING AND 1 TABLET IN THE EVENING What changed:   how much to take  when to take this  additional instructions   ReliOn Insulin Syringe 31G X 15/64" 1 ML Misc Generic drug: Insulin Syringe-Needle U-100 USE AS DIRECTED TWICE DAILY FOR  INSULIN       Follow-up Information    [facility] Follow up.   Specialty: [place] Why: This is the agency that will be providing physical therapy in your home Contact information: [place] [address] [place] [address] [phone]        Ladell Pier, MD Follow up in 1 week(s).   Specialty:  Internal Medicine Contact information: [place] [address] [phone]        Sanda Klein, MD .   Specialty: Cardiology Contact information: [address] [place] [address] [phone]        Ceasar Mons, MD In 2 weeks.   Specialty: Urology Why: My office with call to schedule your follow-up appointment in approximately 2 weeks Contact information: [address] [name] [address] [phone]        Everitt Amber, MD Follow up on 12/11/2020.   Specialty: Gynecologic Oncology Why: at 3pm at the [place] attached to [place] information: [address] [place] [address] [phone]              Allergies  Allergen Reactions  . Ativan [Lorazepam] Anaphylaxis  . Orange Fruit [Citrus] Anaphylaxis and Other (See Comments)    Pt stated throat swelling, itching    Consultations:  [facility]  Urology   Procedures/Studies: DG Abd 1 View  Result Date: 12/05/2020 CLINICAL DATA:  Abdominal pain EXAM: ABDOMEN - 1 VIEW COMPARISON:  CT abdomen and pelvis Nov 30, 2020 FINDINGS: Moderate stool present in colon. There is no bowel dilatation or air-fluid level to suggest bowel obstruction. No free air. Lung bases clear. No abnormal calcifications. IMPRESSION: No bowel obstruction or free air.  Moderate stool in colon. Electronically Signed   By: Lowella Grip III M.D.   On: 12/05/2020 15:34   CT HEAD WO CONTRAST  Result Date: 12/07/2020 CLINICAL DATA:  Altered mental status.  Delirium. EXAM: CT HEAD WITHOUT CONTRAST TECHNIQUE: Contiguous axial images were obtained from the base of the skull through the vertex without intravenous contrast. COMPARISON:  02/08/2016 FINDINGS: Brain: Mild generalized atrophy. Chronic small-vessel ischemic changes of the hemispheric white matter. No sign of acute infarction, mass lesion, hemorrhage, hydrocephalus or extra-axial collection.  Changes are progressive since 2017. Vascular: There is atherosclerotic calcification of the major vessels at the base of the brain. Skull: Negative Sinuses/Orbits: Clear/normal Other: None IMPRESSION: No acute or reversible finding. Atrophy and chronic small-vessel ischemic changes, progressive since 2017. Electronically Signed   By: Nelson Chimes M.D.   On: 12/07/2020  16:17   CT ABDOMEN PELVIS W CONTRAST  Result Date: 11/30/2020 CLINICAL DATA:  Abdominal pain.  Vaginal bleeding. EXAM: CT ABDOMEN AND PELVIS WITH CONTRAST TECHNIQUE: Multidetector CT imaging of the abdomen and pelvis was performed using the standard protocol following bolus administration of intravenous contrast. CONTRAST:  155m OMNIPAQUE IOHEXOL 300 MG/ML  SOLN COMPARISON:  01/15/2019 FINDINGS: Lower chest: No acute abnormality. Hepatobiliary: No focal liver abnormality is seen. No gallstones, gallbladder wall thickening, or biliary dilatation. Pancreas: Unremarkable. No pancreatic ductal dilatation or surrounding inflammatory changes. Spleen: Normal in size without focal abnormality. Adrenals/Urinary Tract: Normal adrenal glands. Normal right kidney. No renal mass. Moderate left hydroureteronephrosis to the level of the left adnexa. Normal bladder. Stomach/Bowel: Stomach is within normal limits. No evidence of bowel wall thickening, distention, or inflammatory changes. Vascular/Lymphatic: Normal caliber abdominal aorta with mild atherosclerosis. No lymphadenopathy. Reproductive: Small dystrophic calcifications within the uterus likely reflecting small fibroids. Enlarged left ovary with a complex cystic left ovarian mass measuring 5.3 x 2.6 x 6.1 cm with a few internal septations incompletely characterized on this exam. Other: No ascites.  Small fat containing umbilical hernia. Musculoskeletal: No acute osseous abnormality. No aggressive osseous lesion. IMPRESSION: 1. No acute abdominal or pelvic pathology. 2. Moderate left hydroureteronephrosis  to the level of the left adnexal mass without an obstructing lesion. 3. Complex cystic left ovarian mass measuring 5.3 x 2.6 x 6.1 cm. Recommend further characterization with a pelvic ultrasound. Electronically Signed   By: HKathreen Devoid  On: 11/30/2020 15:51   DG CHEST PORT 1 VIEW  Result Date: 12/09/2020 CLINICAL DATA:  Generalized weakness, nausea, vomiting. EXAM: PORTABLE CHEST 1 VIEW COMPARISON:  Chest radiograph dated 01/23/2020. FINDINGS: The heart size and mediastinal contours are within normal limits. Both lungs are clear. Degenerative changes are seen in the spine. IMPRESSION: No active disease. Electronically Signed   By: TZerita BoersM.D.   On: 12/09/2020 11:24   UKoreaPELVIC COMPLETE W TRANSVAGINAL AND TORSION R/O  Result Date: 11/30/2020 CLINICAL DATA:  Cystic mass in the left ovary. EXAM: TRANSABDOMINAL AND TRANSVAGINAL ULTRASOUND OF PELVIS DOPPLER ULTRASOUND OF OVARIES TECHNIQUE: Both transabdominal and transvaginal ultrasound examinations of the pelvis were performed. Transabdominal technique was performed for global imaging of the pelvis including uterus, ovaries, adnexal regions, and pelvic cul-de-sac. It was necessary to proceed with endovaginal exam following the transabdominal exam to visualize the ovaries. Color and duplex Doppler ultrasound was utilized to evaluate blood flow to the ovaries. COMPARISON:  CT earlier in the same day FINDINGS: Uterus Measurements: 9.9 x 5.4 x 7 cm = volume: 196 mL. There is a 3 cm fibroid Endometrium Thickness: 9 mm.  No focal abnormality visualized. Right ovary Not visualized Left ovary Measurements: 7.9 x 5.6 x 6.2 cm = volume: 143 mL. There is a complex cystic mass measuring 5.6 x 5.5 x 5.8 cm. This mass demonstrates thick internal septations with possible areas of color Doppler flow involving the septations. Pulsed Doppler evaluation of the left ovary demonstrates normal arterial and venous waveforms. The right ovary was not adequately visualized. Other  findings No abnormal free fluid. IMPRESSION: 1. Again noted is a complex cystic mass involving the left adnexa. Ovarian malignancy is not excluded. Gynecologic follow-up is recommended. A nonemergent contrast enhanced MRI of the pelvis may be useful for further characterization. 2. Nonvisualization of the right ovary. 3. Thickened endometrium measuring 9 mm. Endometrial thickness is considered abnormal for an asymptomatic post-menopausal female. Endometrial sampling should be considered to exclude carcinoma. Electronically  Signed   By: Constance Holster M.D.   On: 11/30/2020 17:56       Subjective: Tired.  No vomiting, no confusion, no fever, no cough, dyspnea, sputum, no dysuria.  Discharge Exam: Vitals:   12/10/20 1038 12/10/20 1135  BP: (!) 180/94 (!) 156/78  Pulse: 78   Resp:    Temp:    SpO2:     Vitals:   12/10/20 0424 12/10/20 0500 12/10/20 1038 12/10/20 1135  BP: (!) 141/69  (!) 180/94 (!) 156/78  Pulse: 65  78   Resp: 19     Temp: 98.5 F (36.9 C)     TempSrc: Oral     SpO2: 98%     Weight:  129.4 kg    Height:        General: Pt is alert, awake, not in acute distress, lying in bed Cardiovascular: RRR, nl S1-S2, no murmurs appreciated.   No LE edema.   Respiratory: Normal respiratory rate and rhythm.  CTAB without rales or wheezes. Abdominal: Abdomen soft, lower abdominal mass seems to be present, and non-tender.  No distension or HSM.   Neuro/Psych: Strength symmetric in upper and lower extremities.  Judgment and insight appear impaired by dementia.   The results of significant diagnostics from this hospitalization (including imaging, microbiology, ancillary and laboratory) are listed below for reference.     Microbiology: Recent Results (from the past 240 hour(s))  SARS CORONAVIRUS 2 (TAT 6-24 HRS) Nasopharyngeal Nasopharyngeal Swab     Status: None   Collection Time: 11/30/20 10:11 PM   Specimen: Nasopharyngeal Swab  Result Value Ref Range Status   SARS  Coronavirus 2 NEGATIVE NEGATIVE Final    Comment: (NOTE) SARS-CoV-2 target nucleic acids are NOT DETECTED.  The SARS-CoV-2 RNA is generally detectable in upper and lower respiratory specimens during the acute phase of infection. Negative results do not preclude SARS-CoV-2 infection, do not rule out co-infections with other pathogens, and should not be used as the sole basis for treatment or other patient management decisions. Negative results must be combined with clinical observations, patient history, and epidemiological information. The expected result is Negative.  Fact Sheet for Patients: SugarRoll.be  Fact Sheet for Healthcare Providers: https://www.woods-mathews.com/  This test is not yet approved or cleared by the [place] FDA and  has been authorized for detection and/or diagnosis of SARS-CoV-2 by FDA under an Emergency Use Authorization (EUA). This EUA will remain  in effect (meaning this test can be used) for the duration of the COVID-19 declaration under Se ction 564(b)(1) of the Act, 21 U.S.C. section 360bbb-3(b)(1), unless the authorization is terminated or revoked sooner.  Performed at [facility], [place] [address], [place], [place] [address]      Labs: BNP (last 3 results) Recent Labs    01/23/20 2329  BNP 03.5   Basic Metabolic Panel: Recent Labs  Lab 12/05/20 0418 12/06/20 0535 12/07/20 0537 12/08/20 0837 12/09/20 0520  NA 137 139 140 138 136  K 3.3* 3.1* 3.6 3.4* 3.2*  CL 109 110 114* 112* 108  CO2 15* 17* 16* 18* 17*  GLUCOSE 225* 106* 140* 298* 140*  BUN 17 25* _0 CREATININE 1.22* 1.71* 1.43* 1.40* 1.42*  CALCIUM 8.9 8.7* 8.9 8.7* 8.7*  MG 2.1  --  2.3  --   --   PHOS 3.7  --  3.3  --   --    Liver Function Tests: Recent Labs  Lab 12/05/20 0418 12/08/20 5974  AST 13* 13*  ALT 14 14  ALKPHOS 54 57  BILITOT 0.6 0.7  PROT 7.1 7.7  ALBUMIN 2.9* 3.1*   No results for  input(s): LIPASE, AMYLASE in the last 168 hours. Recent Labs  Lab 12/08/20 0837  AMMONIA 35   CBC: Recent Labs  Lab 12/05/20 0418 12/06/20 0535 12/07/20 0537 12/08/20 0837  WBC 13.4* 13.5* 11.9* 9.3  NEUTROABS  --   --   --  7.5  HGB 13.3 12.3 13.3 13.4  HCT 39.8 38.0 40.2 40.7  MCV 92.8 94.3 93.7 94.4  PLT 238 249 209 229   Cardiac Enzymes: No results for input(s): CKTOTAL, CKMB, CKMBINDEX, TROPONINI in the last 168 hours. BNP: Invalid input(s): POCBNP CBG: Recent Labs  Lab 12/09/20 0758 12/09/20 1148 12/09/20 1642 12/09/20 2131 12/10/20 0745  GLUCAP 153* 165* 183* 135* 165*   D-Dimer No results for input(s): DDIMER in the last 72 hours. Hgb A1c No results for input(s): HGBA1C in the last 72 hours. Lipid Profile No results for input(s): CHOL, HDL, LDLCALC, TRIG, CHOLHDL, LDLDIRECT in the last 72 hours. Thyroid function studies No results for input(s): TSH, T4TOTAL, T3FREE, THYROIDAB in the last 72 hours.  Invalid input(s): FREET3 Anemia work up No results for input(s): VITAMINB12, FOLATE, FERRITIN, TIBC, IRON, RETICCTPCT in the last 72 hours. Urinalysis    Component Value Date/Time   COLORURINE RED (A) 11/30/2020 1509   APPEARANCEUR HAZY (A) 11/30/2020 1509   LABSPEC 1.006 11/30/2020 1509   PHURINE 9.0 (H) 11/30/2020 1509   GLUCOSEU >=500 (A) 11/30/2020 1509   HGBUR LARGE (A) 11/30/2020 1509   HGBUR large 04/03/2010 0931   BILIRUBINUR NEGATIVE 11/30/2020 1509   KETONESUR 5 (A) 11/30/2020 1509   PROTEINUR 100 (A) 11/30/2020 1509   UROBILINOGEN 0.2 03/22/2013 1159   NITRITE NEGATIVE 11/30/2020 1509   LEUKOCYTESUR NEGATIVE 11/30/2020 1509   Sepsis Labs Invalid input(s): PROCALCITONIN,  WBC,  LACTICIDVEN Microbiology Recent Results (from the past 240 hour(s))  SARS CORONAVIRUS 2 (TAT 6-24 HRS) Nasopharyngeal Nasopharyngeal Swab     Status: None   Collection Time: 11/30/20 10:11 PM   Specimen: Nasopharyngeal Swab  Result Value Ref Range Status   SARS  Coronavirus 2 NEGATIVE NEGATIVE Final    Comment: (NOTE) SARS-CoV-2 target nucleic acids are NOT DETECTED.  The SARS-CoV-2 RNA is generally detectable in upper and lower respiratory specimens during the acute phase of infection. Negative results do not preclude SARS-CoV-2 infection, do not rule out co-infections with other pathogens, and should not be used as the sole basis for treatment or other patient management decisions. Negative results must be combined with clinical observations, patient history, and epidemiological information. The expected result is Negative.  Fact Sheet for Patients: SugarRoll.be  Fact Sheet for Healthcare Providers: https://www.woods-mathews.com/  This test is not yet approved or cleared by the [place] FDA and  has been authorized for detection and/or diagnosis of SARS-CoV-2 by FDA under an Emergency Use Authorization (EUA). This EUA will remain  in effect (meaning this test can be used) for the duration of the COVID-19 declaration under Se ction 564(b)(1) of the Act, 21 U.S.C. section 360bbb-3(b)(1), unless the authorization is terminated or revoked sooner.  Performed at [facility], [place] [address], [place], [place] [address]      Time coordinating discharge: 35 minutes The [place] controlled substances registry was reviewed for this patient     30 Day Unplanned Readmission Risk Score   Flowsheet Row ED to Hosp-Admission (Discharged) from 11/30/2020 in  [facility]  30 Day Unplanned Readmission Risk Score (%) 22.62 Filed at 12/10/2020 0801     This score is the patient's risk of an unplanned readmission within 30 days of being discharged (0 -100%). The score is based on dignosis, age, lab data, medications, orders, and past utilization.   Low:  0-14.9   Medium: 15-21.9   High: 22-29.9   Extreme: 30 and above           SIGNED:   Edwin Dada,  MD  [facility] 12/10/2020, 5:34 PM

## 2020-12-11 ENCOUNTER — Ambulatory Visit: Payer: Medicare Other | Admitting: Gynecologic Oncology

## 2021-01-04 ENCOUNTER — Other Ambulatory Visit: Payer: Self-pay | Admitting: Internal Medicine

## 2021-01-04 DIAGNOSIS — I2510 Atherosclerotic heart disease of native coronary artery without angina pectoris: Secondary | ICD-10-CM

## 2021-01-05 NOTE — Telephone Encounter (Signed)
Requested medication (s) are due for refill today: no  Requested medication (s) are on the active medication list: yes   Last refill: 11/10/2020  Future visit scheduled:no  Notes to clinic: patient overdue for labs and follow up appointment   Requested Prescriptions  Pending Prescriptions Disp Refills   atorvastatin (LIPITOR) 80 MG tablet [Pharmacy Med Name: Atorvastatin Calcium 80 MG Oral Tablet] 90 tablet 3    Sig: TAKE 1 TABLET BY MOUTH ONCE DAILY IN THE EVENING AT 6  PM      Cardiovascular:  Antilipid - Statins Failed - 01/04/2021 10:06 PM      Failed - HDL in normal range and within 360 days    HDL  Date Value Ref Range Status  01/23/2020 31 (L) >40 mg/dL Final  12/24/2019 32 (L) >39 mg/dL Final          Passed - Total Cholesterol in normal range and within 360 days    Cholesterol, Total  Date Value Ref Range Status  12/24/2019 131 100 - 199 mg/dL Final   Cholesterol  Date Value Ref Range Status  01/23/2020 126 0 - 200 mg/dL Final          Passed - LDL in normal range and within 360 days    LDL Chol Calc (NIH)  Date Value Ref Range Status  12/24/2019 79 0 - 99 mg/dL Final   LDL Cholesterol  Date Value Ref Range Status  01/23/2020 81 0 - 99 mg/dL Final    Comment:           Total Cholesterol/HDL:CHD Risk Coronary Heart Disease Risk Table                     Men   Women  1/2 Average Risk   3.4   3.3  Average Risk       5.0   4.4  2 X Average Risk   9.6   7.1  3 X Average Risk  23.4   11.0        Use the calculated Patient Ratio above and the CHD Risk Table to determine the patient's CHD Risk.        ATP III CLASSIFICATION (LDL):  <100     mg/dL   Optimal  100-129  mg/dL   Near or Above                    Optimal  130-159  mg/dL   Borderline  160-189  mg/dL   High  >190     mg/dL   Very High Performed at [facility] [address], [place], [place] [address]           Passed - Triglycerides in normal range and within 360 days     Triglycerides  Date Value Ref Range Status  01/23/2020 69 <150 mg/dL Final          Passed - Patient is not pregnant      Passed - Valid encounter within last 12 months    Recent Outpatient Visits           10 months ago Encounter for [employer] annual wellness exam   [place], [place], NP   11 months ago Hospital discharge follow-up   [place], MD   1 year ago Need for vaccination for Strep pneumoniae   [place],  Jarome [place]atin, RPH-CPP   1 year ago Type 2 diabetes mellitus with other circulatory complication, with long-term current use of insulin ([place])   [place] Ladell Pier, MD   1 year ago Type 2 diabetes mellitus with other circulatory complication, with long-term current use of insulin ([place])   [place] Ladell Pier, MD                  furosemide (LASIX) 80 MG tablet [Pharmacy Med Name: FUROSEMIDE  80MG   TAB] 180 tablet 3    Sig: TAKE 1 TABLET BY MOUTH TWO  TIMES DAILY WITH FUROSEMIDE 40MG   (TOTAL DOSE OF 120MG   TWO TIMES DAILY )      Cardiovascular:  Diuretics - Loop Failed - 01/04/2021 10:06 PM      Failed - K in normal range and within 360 days    Potassium  Date Value Ref Range Status  12/09/2020 3.2 (L) 3.5 - 5.1 mmol/L Final          Failed - Ca in normal range and within 360 days    Calcium  Date Value Ref Range Status  12/09/2020 8.7 (L) 8.9 - 10.3 mg/dL Final   Calcium, Ion  Date Value Ref Range Status  11/30/2020 1.06 (L) 1.15 - 1.40 mmol/L Final          Failed - Cr in normal range and within 360 days    Creat  Date Value Ref Range Status  09/28/2016 1.30 (H) 0.50 - 0.99 mg/dL Final    Comment:      For patients > or = 66 years of age: The upper reference limit for Creatinine is approximately 13% higher for people identified  as African-American.      Creatinine, Ser  Date Value Ref Range Status  12/09/2020 1.42 (H) 0.44 - 1.00 mg/dL Final   Creatinine, POC  Date Value Ref Range Status  11/26/2016 100mg  mg/dL Final   Creatinine, Urine  Date Value Ref Range Status  03/22/2013 33.2 mg/dL Final          Failed - Last BP in normal range    BP Readings from Last 1 Encounters:  12/10/20 (!) 156/78          Failed - Valid encounter within last 6 months    Recent Outpatient Visits           10 months ago Encounter for [employer] annual wellness exam   [place], [place], NP   11 months ago Hospital discharge follow-up   [place], MD   1 year ago Need for vaccination for Strep pneumoniae   [place], RPH-CPP   1 year ago Type 2 diabetes mellitus with other circulatory complication, with long-term current use of insulin [facility])   [place] Karle Plumber B, MD   1 year ago Type 2 diabetes mellitus with other circulatory complication, with long-term current use of insulin [facility])   [place] Ladell Pier, MD                Passed - Na in normal range and within 360 days    Sodium  Date Value Ref Range Status  12/09/2020 136 135 - 145 mmol/L Final  12/24/2019 141 134 - 144 mmol/L Final  furosemide (LASIX) 40 MG tablet [Pharmacy Med Name: Furosemide 40 MG Oral Tablet] 180 tablet 3    Sig: TAKE 1 TABLET BY MOUTH  TWICE DAILY (TAKE WITH  FUROSEMIDE 80 MG TAB)      Cardiovascular:  Diuretics - Loop Failed - 01/04/2021 10:06 PM      Failed - K in normal range and within 360 days    Potassium  Date Value Ref Range Status  12/09/2020 3.2 (L) 3.5 - 5.1 mmol/L Final          Failed - Ca in normal range and within 360 days    Calcium  Date Value Ref Range Status  12/09/2020  8.7 (L) 8.9 - 10.3 mg/dL Final   Calcium, Ion  Date Value Ref Range Status  11/30/2020 1.06 (L) 1.15 - 1.40 mmol/L Final          Failed - Cr in normal range and within 360 days    Creat  Date Value Ref Range Status  09/28/2016 1.30 (H) 0.50 - 0.99 mg/dL Final    Comment:      For patients > or = 66 years of age: The upper reference limit for Creatinine is approximately 13% higher for people identified as African-American.      Creatinine, Ser  Date Value Ref Range Status  12/09/2020 1.42 (H) 0.44 - 1.00 mg/dL Final   Creatinine, POC  Date Value Ref Range Status  11/26/2016 100mg  mg/dL Final   Creatinine, Urine  Date Value Ref Range Status  03/22/2013 33.2 mg/dL Final          Failed - Last BP in normal range    BP Readings from Last 1 Encounters:  12/10/20 (!) 156/78          Failed - Valid encounter within last 6 months    Recent Outpatient Visits           10 months ago Encounter for [employer] annual wellness exam   [place], [place], NP   11 months ago Hospital discharge follow-up   [place], MD   1 year ago Need for vaccination for Strep pneumoniae   [place], RPH-CPP   1 year ago Type 2 diabetes mellitus with other circulatory complication, with long-term current use of insulin [facility])   [place] Karle Plumber B, MD   1 year ago Type 2 diabetes mellitus with other circulatory complication, with long-term current use of insulin [place])   [place] Ladell Pier, MD                Passed - Na in normal range and within 360 days    Sodium  Date Value Ref Range Status  12/09/2020 136 135 - 145 mmol/L Final  12/24/2019 141 134 - 144 mmol/L Final

## 2021-01-09 IMAGING — US US PELVIS COMPLETE WITH TRANSVAGINAL
1 series · 15 of 25 positions shown · non-contrast
Comparison: None

CLINICAL DATA: Postmenopausal bleeding



[Series 1: us pelvis complete with transvaginal · 104 acquisitions, 15 frames shown]
[im 1/104]
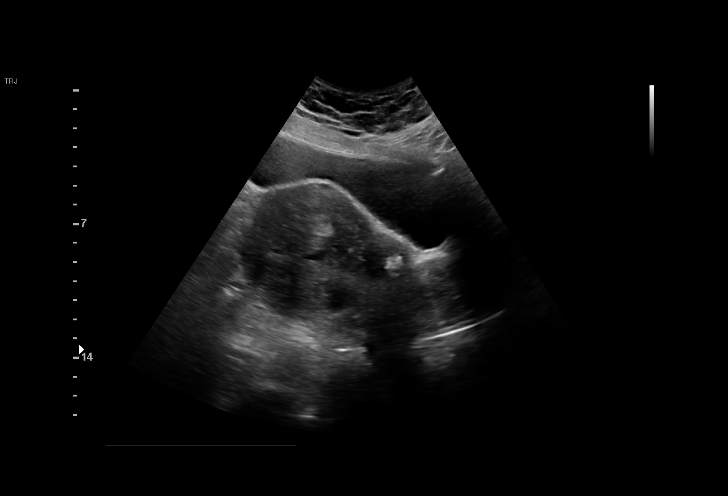
[im 9/104]
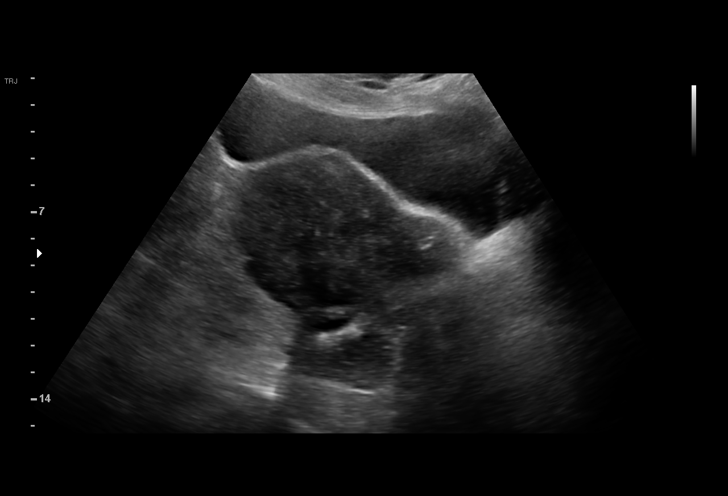
[im 18/104]
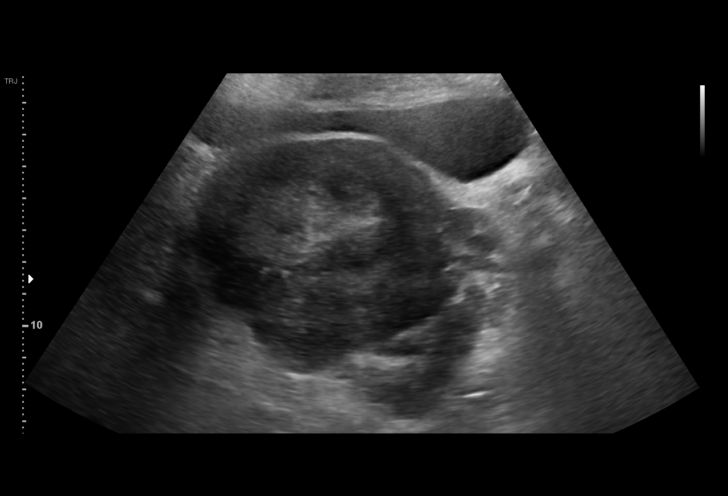
[im 22/104]
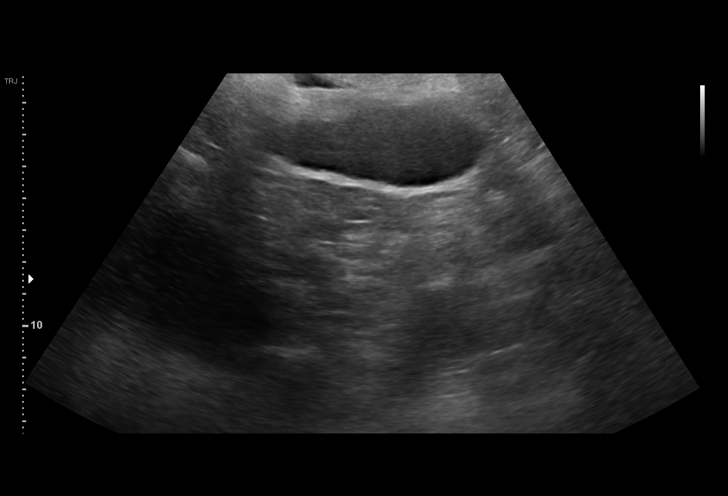
[im 31/104]
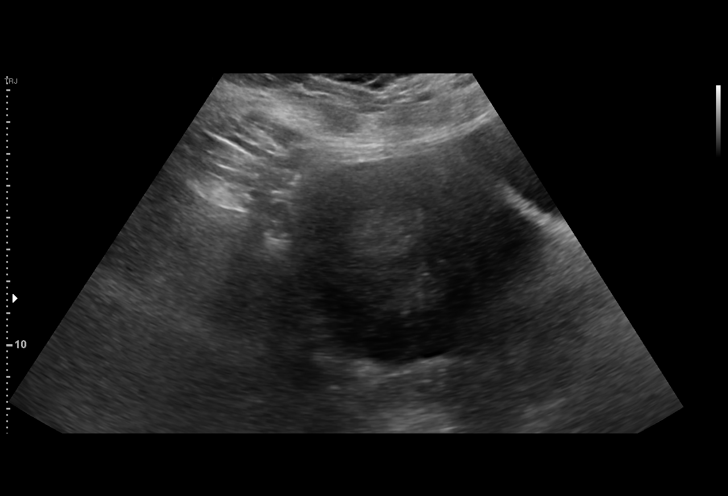
[im 39/104]
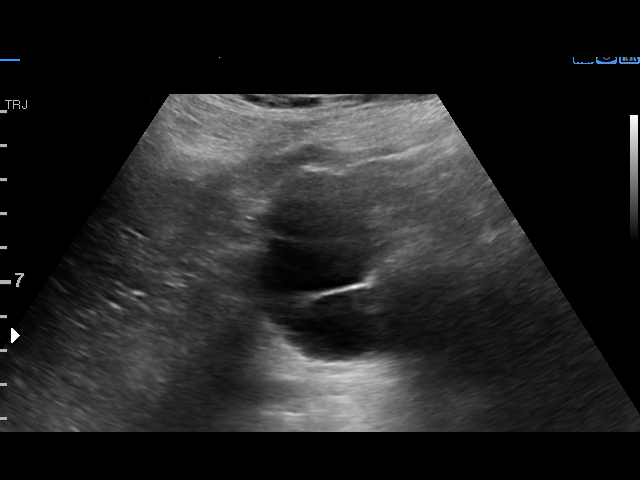
[im 43/104]
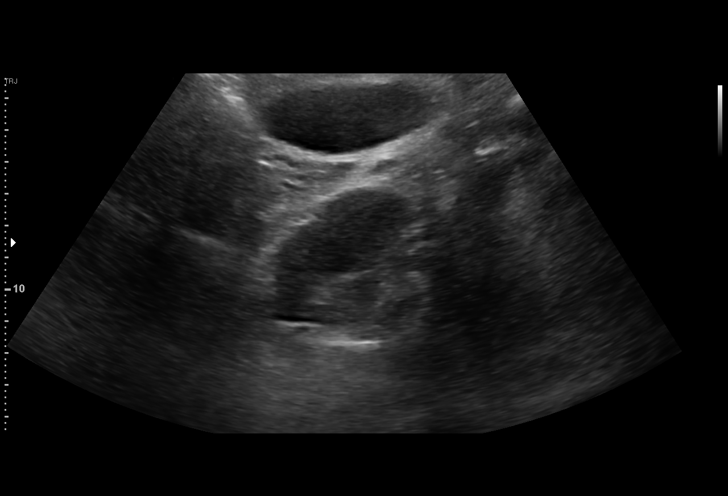
[im 52/104]
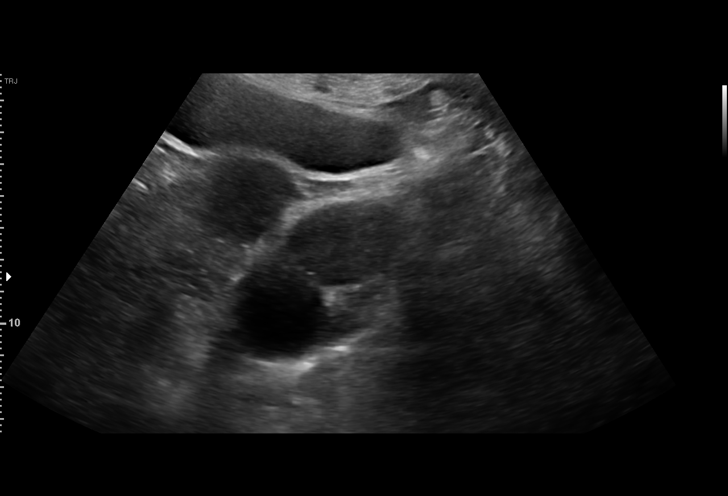
[im 61/104]
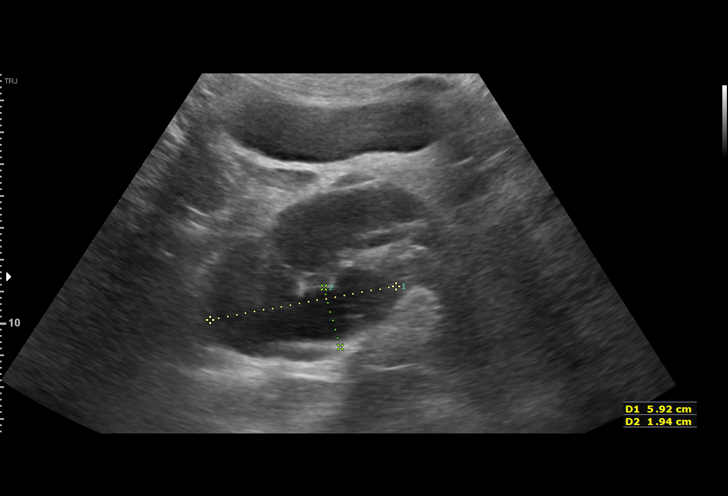
[im 65/104]
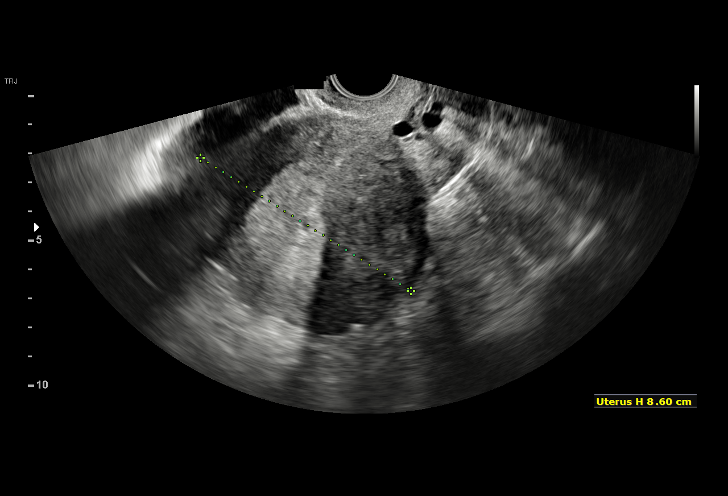
[im 73/104]
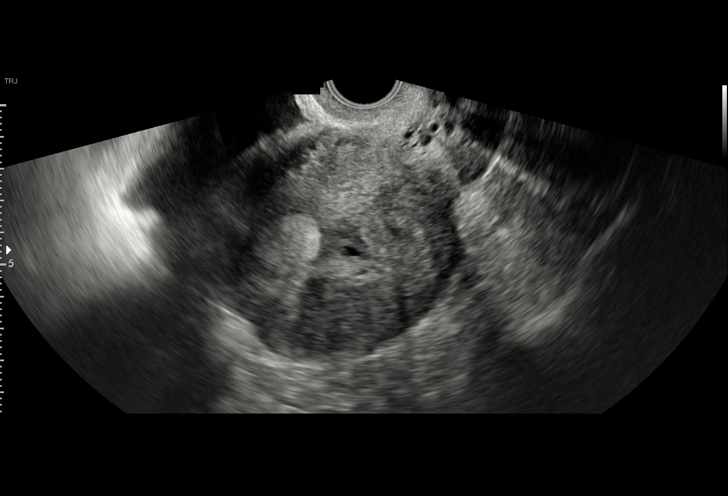
[im 82/104]
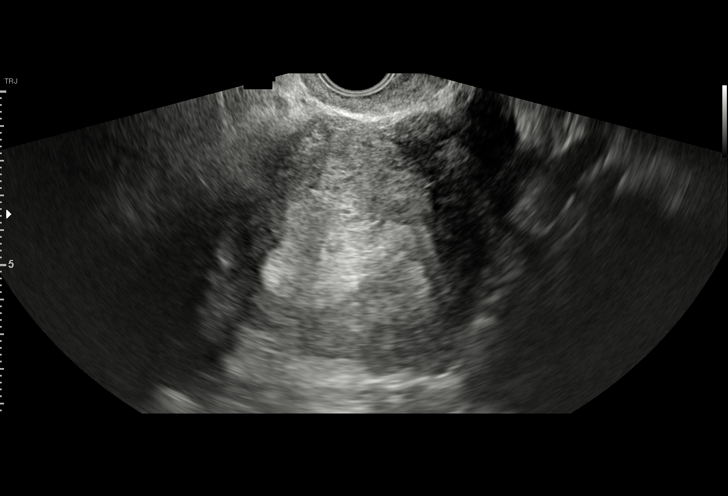
[im 86/104]
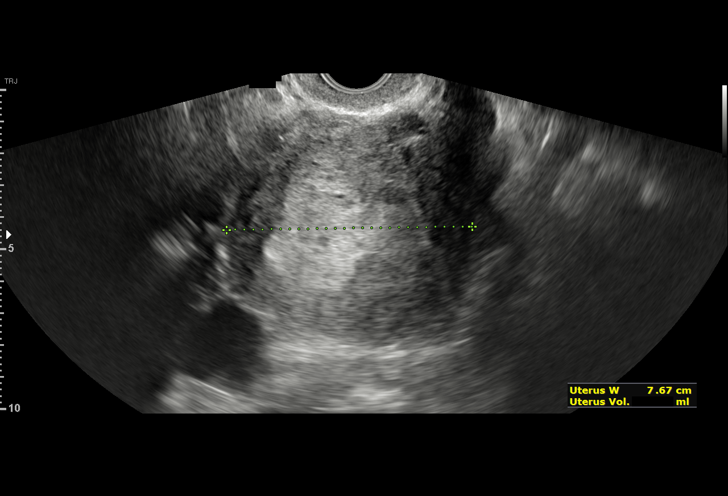
[im 95/104]
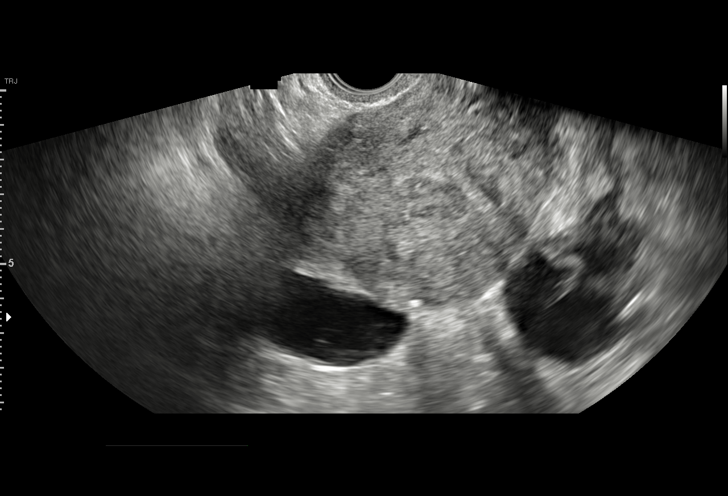
[im 104/104]
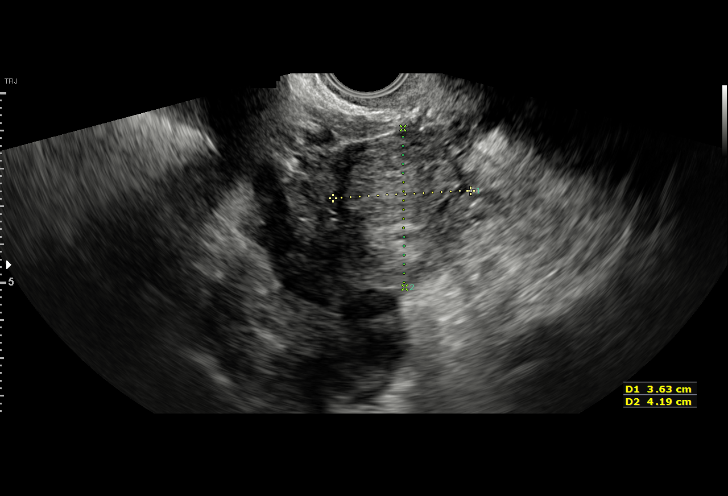

[15 of 25 positions shown; findings below may reference images not displayed]

FINDINGS: Uterus

Measurements: 13.0 x 6.7 x 8.1 cm = volume: 370 mL. Heterogeneous
echotexture. At least 3 fibroids noted, the largest a left
subserosal fibroid measuring 3.9 cm. 3 cm posterior lower uterine
segment fibroid and 3 cm right fundal subserosal fibroid

Endometrium

Thickness: Thickened endometrium measuring up to 32 mm in thickness
with scattered cystic areas. Heterogeneous appearance..

Right ovary

Measurements: Not visualized. There is a cystic area noted within
the right adnexa measuring 6.6 x 4.7 x 2.7 cm. This could reflect
hydrosalpinx.

Left ovary

Measurements: Not visualized. There is a tubular structure in the
left adnexa measuring 7.7 x 4.2 x 3.5 cm, most compatible with
hydrosalpinx.

Other findings

No abnormal free fluid.
IMPRESSION: Enlarged fibroid uterus.

Markedly thickened endometrium measuring up to 32 mm with
heterogeneity and scattered cystic areas. In the setting of
post-menopausal bleeding, endometrial sampling is indicated to
exclude carcinoma. If results are benign, sonohysterogram should be
considered for focal lesion work-up. (Ref: Radiological Reasoning:
Algorithmic Workup of Abnormal Vaginal Bleeding with Endovaginal
Sonography and Sonohysterography. [id number]; [id number])

Bilateral tubular shaped cystic areas suggest bilateral
hydrosalpinges. Neither ovary could be visualized.

## 2021-03-19 ENCOUNTER — Other Ambulatory Visit: Payer: Self-pay | Admitting: Cardiovascular Disease

## 2021-04-12 ENCOUNTER — Other Ambulatory Visit: Payer: Self-pay

## 2021-04-12 ENCOUNTER — Other Ambulatory Visit: Payer: Self-pay | Admitting: Cardiovascular Disease

## 2021-04-12 ENCOUNTER — Encounter (HOSPITAL_COMMUNITY): Payer: Self-pay

## 2021-04-12 ENCOUNTER — Emergency Department (HOSPITAL_COMMUNITY): Payer: Medicare Other

## 2021-04-12 ENCOUNTER — Observation Stay (HOSPITAL_COMMUNITY)
Admission: EM | Admit: 2021-04-12 | Discharge: 2021-04-15 | Disposition: A | Discharge status: Home / Self Care | Payer: Medicare Other | Attending: Student in an Organized Health Care Education/Training Program | Admitting: Student in an Organized Health Care Education/Training Program

## 2021-04-12 DIAGNOSIS — I509 Heart failure, unspecified: Secondary | ICD-10-CM | POA: Diagnosis not present

## 2021-04-12 DIAGNOSIS — N950 Postmenopausal bleeding: Secondary | ICD-10-CM

## 2021-04-12 DIAGNOSIS — I4510 Unspecified right bundle-branch block: Secondary | ICD-10-CM | POA: Diagnosis not present

## 2021-04-12 DIAGNOSIS — Z794 Long term (current) use of insulin: Secondary | ICD-10-CM | POA: Diagnosis not present

## 2021-04-12 DIAGNOSIS — Z79899 Other long term (current) drug therapy: Secondary | ICD-10-CM | POA: Diagnosis not present

## 2021-04-12 DIAGNOSIS — I110 Hypertensive heart disease with heart failure: Secondary | ICD-10-CM | POA: Diagnosis not present

## 2021-04-12 DIAGNOSIS — R079 Chest pain, unspecified: Secondary | ICD-10-CM | POA: Diagnosis not present

## 2021-04-12 DIAGNOSIS — I5032 Chronic diastolic (congestive) heart failure: Secondary | ICD-10-CM | POA: Diagnosis present

## 2021-04-12 DIAGNOSIS — E1122 Type 2 diabetes mellitus with diabetic chronic kidney disease: Secondary | ICD-10-CM | POA: Diagnosis not present

## 2021-04-12 DIAGNOSIS — E1159 Type 2 diabetes mellitus with other circulatory complications: Secondary | ICD-10-CM

## 2021-04-12 DIAGNOSIS — Z20822 Contact with and (suspected) exposure to covid-19: Secondary | ICD-10-CM | POA: Diagnosis not present

## 2021-04-12 DIAGNOSIS — I517 Cardiomegaly: Secondary | ICD-10-CM | POA: Diagnosis not present

## 2021-04-12 DIAGNOSIS — R6889 Other general symptoms and signs: Secondary | ICD-10-CM | POA: Diagnosis not present

## 2021-04-12 DIAGNOSIS — I2510 Atherosclerotic heart disease of native coronary artery without angina pectoris: Secondary | ICD-10-CM | POA: Diagnosis present

## 2021-04-12 DIAGNOSIS — E118 Type 2 diabetes mellitus with unspecified complications: Secondary | ICD-10-CM

## 2021-04-12 DIAGNOSIS — E785 Hyperlipidemia, unspecified: Secondary | ICD-10-CM | POA: Diagnosis present

## 2021-04-12 DIAGNOSIS — E876 Hypokalemia: Secondary | ICD-10-CM

## 2021-04-12 DIAGNOSIS — Z7982 Long term (current) use of aspirin: Secondary | ICD-10-CM | POA: Diagnosis not present

## 2021-04-12 DIAGNOSIS — Z743 Need for continuous supervision: Secondary | ICD-10-CM | POA: Diagnosis not present

## 2021-04-12 DIAGNOSIS — N183 Chronic kidney disease, stage 3 unspecified: Secondary | ICD-10-CM | POA: Diagnosis not present

## 2021-04-12 DIAGNOSIS — G4733 Obstructive sleep apnea (adult) (pediatric): Secondary | ICD-10-CM | POA: Diagnosis present

## 2021-04-12 DIAGNOSIS — I10 Essential (primary) hypertension: Secondary | ICD-10-CM | POA: Diagnosis present

## 2021-04-12 DIAGNOSIS — R0789 Other chest pain: Secondary | ICD-10-CM | POA: Diagnosis not present

## 2021-04-12 DIAGNOSIS — R404 Transient alteration of awareness: Secondary | ICD-10-CM | POA: Diagnosis not present

## 2021-04-12 LAB — CBC
HCT: 37.9 % (ref 36.0–46.0)
Hemoglobin: 12.4 g/dL (ref 12.0–15.0)
MCH: 31.1 pg (ref 26.0–34.0)
MCHC: 32.7 g/dL (ref 30.0–36.0)
MCV: 95 fL (ref 80.0–100.0)
Platelets: 243 10*3/uL (ref 150–400)
RBC: 3.99 MIL/uL (ref 3.87–5.11)
RDW: 13.1 % (ref 11.5–15.5)
WBC: 9.1 10*3/uL (ref 4.0–10.5)
nRBC: 0 % (ref 0.0–0.2)

## 2021-04-12 LAB — COMPREHENSIVE METABOLIC PANEL
ALT: 12 U/L (ref 0–44)
AST: 15 U/L (ref 15–41)
Albumin: 2.8 g/dL — ABNORMAL LOW (ref 3.5–5.0)
Alkaline Phosphatase: 45 U/L (ref 38–126)
Anion gap: 9 (ref 5–15)
BUN: 10 mg/dL (ref 8–23)
CO2: 19 mmol/L — ABNORMAL LOW (ref 22–32)
Calcium: 8.8 mg/dL — ABNORMAL LOW (ref 8.9–10.3)
Chloride: 107 mmol/L (ref 98–111)
Creatinine, Ser: 1.04 mg/dL — ABNORMAL HIGH (ref 0.44–1.00)
GFR, Estimated: 59 mL/min — ABNORMAL LOW (ref >60)
Glucose, Bld: 334 mg/dL — ABNORMAL HIGH (ref 70–99)
Potassium: 3.7 mmol/L (ref 3.5–5.1)
Sodium: 135 mmol/L (ref 135–145)
Total Bilirubin: 0.6 mg/dL (ref 0.3–1.2)
Total Protein: 6.6 g/dL (ref 6.5–8.1)

## 2021-04-12 LAB — TROPONIN I (HIGH SENSITIVITY)
Troponin I (High Sensitivity): 11 ng/L (ref <18)
Troponin I (High Sensitivity): 9 ng/L (ref <18)

## 2021-04-12 LAB — BRAIN NATRIURETIC PEPTIDE: B Natriuretic Peptide: 63.1 pg/mL (ref 0.0–100.0)

## 2021-04-12 MED ORDER — DILTIAZEM HCL ER COATED BEADS 240 MG PO CP24
240.0000 mg | ORAL_CAPSULE | Freq: Once | ORAL | Status: AC
  Administered 2021-04-12: 240 mg via ORAL
  Filled 2021-04-12 (×2): qty 1

## 2021-04-12 MED ORDER — LABETALOL HCL 5 MG/ML IV SOLN
10.0000 mg | Freq: Once | INTRAVENOUS | Status: AC
  Administered 2021-04-12: 10 mg via INTRAVENOUS
  Filled 2021-04-12: qty 4

## 2021-04-12 MED ORDER — LABETALOL HCL 5 MG/ML IV SOLN
20.0000 mg | Freq: Once | INTRAVENOUS | Status: AC
  Administered 2021-04-12: 20 mg via INTRAVENOUS

## 2021-04-12 MED ORDER — LOSARTAN POTASSIUM 50 MG PO TABS
100.0000 mg | ORAL_TABLET | Freq: Every day | ORAL | Status: DC
  Administered 2021-04-12: 100 mg via ORAL
  Filled 2021-04-12: qty 2

## 2021-04-12 NOTE — ED Triage Notes (Signed)
Patient c/o left chest pain that radiates into her back. Also left arm numbness. Sounds like symptoms have been going on for a while. Patient was hypertensive, with original BP of 215/108 for ems. Husband gave her 3 nitro prior to EMS arrival, EMS gave her 2 more. Patient c/o headache now.

## 2021-04-12 NOTE — ED Provider Notes (Signed)
[facility] EMERGENCY DEPARTMENT Provider Note   CSN: [id number] Arrival date & time: 04/12/21  1723     History Chief Complaint  Patient presents with   Chest Pain   Hypertension    [name] is a 65 y.o. female with PMHx HTN, HLD, IDDM, CAD, diastolic CHF, CKD IIIa, right eye blindness, OSA, morbid obesity, who presents for evaluation of chest pain.  Patient presents for evaluation of an approximately 3.5-hour history of chest pain.  She states that the primary left-sided, radiates to her left arm, described as a pressure, nonexertional, nonpleuritic, and associated with a tingling sensation in her left upper extremity.  Of note, the patient states that she has a history of bilateral carpal tunnel with intermittent tingling in her hands; however, this is out of proportion to her normal symptoms.  She states that she has had increased bilateral lower extremity edema, slightly worse in the left.  She denies any recent fever, shortness of breath, or cough.  No numbness or weakness.  No syncope.  No nausea, vomiting, abdominal pain, diarrhea, or constipation.  She states that her husband gave her 3 SL nitroglycerin with mild improvement.  EMS was called, the patient was subsequently transported to our emergency department for further evaluation.  Patient was given 2 additional nitroglycerin and 325 mg of aspirin while in route with resolution of her pain.  Otherwise, patient states that she has been in her normal state of health in the preceding weeks.  She denies any recent history of exertional chest pain or dyspnea.  She has a prior history of coronary artery disease, with last cardiac catheterization in July 2010, which demonstrated three-vessel disease which was treated medically.  She has not had another left heart cath since that time.  She denies any recent provocative testing.     Past Medical History:  Diagnosis Date   Blind right eye    Chronic back pain     "my whole back" (03/18/2015)   Chronic chest pain    takes isosorbide   Chronic diastolic heart failure ([place]) 9/13   echo 03/20/15 LV Ef of 60-65%, grade 2DD and  PA peak pressure: 36 mm Hg    CKD (chronic kidney disease), stage III (HCC)    Coronary artery disease    Dr Sallyanne Kuster, cardiac cath 02-07-2009 -- diffuse 3V CAD involving RCA, CFx, and D1,  pt not a candidate for bypass surgery or angioplasty,  medical management   Diabetic neuropathy ([place])    Diabetic retinopathy ([place])    bilateral proliferative   Diastolic CHF, chronic ([place])    Dyspnea    "always"   Edema of both lower extremities    Generalized weakness    GERD (gastroesophageal reflux disease)    Glaucoma, both eyes    History of non-ST elevation myocardial infarction (NSTEMI)    02-25-2009;  05-15-2009;  01-08-2010;  04-04-2012 this MI was post-op surgery for abscess on 04-03-2012   History of sepsis    Hyperlipemia    Hypertension    Insulin dependent type 2 diabetes mellitus ([place])    followed by pcp   Iron deficiency anemia due to chronic blood loss    uterine bleeding   Legally blind in left eye, as defined in [place]    per pt states vision "is foggy"   Mild obstructive sleep apnea    study in epic 04-19-2012 mild osa,  recommendation's were wt. loss, dental  appliance, surgery, or cpap  Morbid obesity with BMI of 40.0-44.9, adult (HCC)    OA (osteoarthritis)    knees   PSVT (paroxysmal supraventricular tachycardia) ([place])    Renal insufficiency    pt. denies   Seasonal allergies    Wears glasses     Patient Active Problem List   Diagnosis Date Noted   Nausea & vomiting 12/01/2020   Ovarian mass, left 12/01/2020   Hypokalemia 12/01/2020   Leukocytosis 12/01/2020   Generalized weakness 11/30/2020   Acute respiratory failure with hypoxia ([place]) 01/24/2020   Chest pain 01/23/2020   Acute exacerbation of CHF (congestive heart failure) ([place]) 01/23/2020   Functional urinary incontinence 09/07/2019   Urge  incontinence of urine 09/07/2019   Legally blind in right eye, as defined in [place] 08/11/2018   Bilateral carpal tunnel syndrome 04/13/2018   Adenomatous polyp of colon 04/13/2018   Fibrocystic breast changes, right 12/12/2017   Drug-induced constipation 09/15/2017   Primary osteoarthritis of both knees 04/05/2017   Chronic bilateral low back pain 03/07/2017   Chronic pain of right knee 03/07/2017   Vitamin B12 deficiency 12/09/2016   Incidental lung nodule, > 67m and < 820m02/13/2018   Vitreous hemorrhage of right eye ([facility]   Diabetic gastroparesis associated with type 2 diabetes mellitus (H[place]02/05/2016   Controlled type 2 diabetes mellitus with complication, with long-term current use of insulin ([facility]02/05/2016   Chronic renal insufficiency, stage III (moderate) (HCC) 03/19/2015   Prolonged Q-T interval on ECG 08/10/2013   Dyslipidemia 04/02/2013   Acute on chronic diastolic heart failure ([place]09/01/2012   Presumed OSA (obstructive sleep apnea) 04/06/2012   NSTEMI  post-op 04/04/12-medical Rx 04/04/2012   Chronic diastolic heart failure ([facility]09/2013   Severe obesity (BMI >= 40) ([facility]06/17/2013   GERD 08/21/2009   Complex endometrial hyperplasia with atypia 03/20/2009   PERIPHERAL EDEMA 03/20/2009   Coronary atherosclerosis-not a surgical or PCI candidate 2010 02/27/2009   Proliferative diabetic retinopathy ([facility]03/01/2008   DETACHED RETINA, BILATERAL, HX OF 09/07/2007   History of chronic Iron defeicency anemia with acute post op anemia, transfused after debridment 04/04/12 04/17/2007   Essential hypertension 04/17/2007    Past Surgical History:  Procedure Laterality Date   BIOPSY  02/13/2018   Procedure: BIOPSY;  Surgeon: StLadene ArtistMD;  Location: [facility];  Service: Endoscopy;;   BREAST SURGERY Right 2019    breast biopsy,  per pt benign   CARDIAC CATHETERIZATION  02-27-2009   dr al little   diffuse 3V CAD , involving RCA, CFx, and D1;  inferior wall  abnormalities, low normal ef 50%   CATARACT EXTRACTION Right    CATARACT EXTRACTION W/ INTRAOCULAR LENS IMPLANT Left 10-11-2007   '@[facility]'    COLONOSCOPY WITH PROPOFOL N/A 02/13/2018   Procedure: COLONOSCOPY WITH PROPOFOL;  Surgeon: StLadene ArtistMD;  Location: [facility];  Service: Endoscopy;  Laterality: N/A;   DILATION AND CURETTAGE OF UTERUS  x2 last one 2000   EYE SURGERY Bilateral 2009 to 2010   x2  left eye;  x2  right eye    HYSTEROSCOPY WITH D & C N/A 10/11/2018   Procedure: DILATATION AND CURETTAGE /HYSTEROSCOPY;  Surgeon: PrDonnamae JudeMD;  Location: [facility];  Service: Gynecology;  Laterality: N/A;   INCISION AND DRAINAGE PERIRECTAL ABSCESS  04/03/2012   INTRAUTERINE DEVICE (IUD) INSERTION  10/11/2018   Procedure: INTRAUTERINE DEVICE (IUD) INSERTION;  Surgeon: PrDonnamae JudeMD;  Location: [facility];  Service: Gynecology;;   IR [place]  VASC ACCESS LEFT  02/13/2018   IR VENIPUNCTURE 58YRS/OLDER BY MD  02/13/2018   POLYPECTOMY  02/13/2018   Procedure: POLYPECTOMY;  Surgeon: Ladene Artist, MD;  Location: [facility];  Service: Endoscopy;;     OB History     Gravida  0   Para  0   Term  0   Preterm  0   AB  0   Living  0      SAB  0   IAB  0   Ectopic  0   Multiple  0   Live Births  0           Family History  Problem Relation Age of Onset   Diabetes Mother    Heart disease Mother    Heart attack Mother    Hypertension Mother    Breast cancer Mother    Colon polyps Father    Diabetes Father    Heart disease Father    Heart attack Father    Stroke Father    Hypertension Father    Diabetes Brother    Diabetes Sister    Heart disease Brother    Heart disease Sister    Diabetes Maternal Grandmother    Hypertension Maternal Grandmother    Hypertension Brother    Hypertension Sister    Breast cancer Cousin    Colon cancer Cousin    Esophageal cancer Neg Hx    Rectal cancer Neg Hx    Stomach cancer Neg Hx     Social History   Tobacco Use    Smoking status: Never   Smokeless tobacco: Never  Vaping Use   Vaping Use: Never used  Substance Use Topics   Alcohol use: No   Drug use: Never    Home Medications Prior to Admission medications   Medication Sig Start Date End Date Taking? Authorizing Provider  acetaminophen (TYLENOL) 500 MG tablet Take 500 mg by mouth every 6 (six) hours as needed for moderate pain.   Yes [provider]  acetaZOLAMIDE (DIAMOX) 500 MG capsule Take 1 capsule (500 mg total) by mouth 2 (two) times daily. 12/10/20  Yes Danford, Suann Larry, MD  aspirin EC 81 MG tablet Take 81 mg by mouth daily.   Yes [provider]  atorvastatin (LIPITOR) 80 MG tablet TAKE 1 TABLET BY MOUTH ONCE DAILY IN THE EVENING AT 6  PM Patient taking differently: Take 80 mg by mouth every evening. 01/22/20  Yes Ladell Pier, MD  bismuth subsalicylate (PEPTO BISMOL) 262 MG/15ML suspension Take 30 mLs by mouth every 6 (six) hours as needed for indigestion.   Yes [provider]  brimonidine-timolol (COMBIGAN) 0.2-0.5 % ophthalmic solution Place 1 drop into both eyes 2 (two) times daily.    Yes [provider]  diltiazem (DILACOR XR) 240 MG 24 hr capsule Take 1 capsule (240 mg total) by mouth daily. 12/03/20  Yes Shawna Clamp, MD  ferrous sulfate 325 (65 FE) MG tablet Take 1 tablet (325 mg total) by mouth daily with breakfast. 12/03/20  Yes Shawna Clamp, MD  furosemide (LASIX) 40 MG tablet TAKE 1 TABLET BY MOUTH  TWICE DAILY (TAKE WITH  FUROSEMIDE 80 MG TAB) Patient taking differently: Take 40 mg by mouth 2 (two) times daily. 01/16/20  Yes Ladell Pier, MD  HUMALOG MIX 75/25 (75-25) 100 UNIT/ML SUSP injection INJECT SUBCUTANEOUSLY 50  UNITS TWICE DAILY WITH  MEALS Patient taking differently: Inject 54 Units into the skin 2 (two) times daily  with a meal. 09/09/20  Yes Ladell Pier, MD  isosorbide mononitrate (IMDUR) 120 MG 24 hr tablet TAKE 1 TABLET BY MOUTH ONCE DAILY Patient taking  differently: Take 120 mg by mouth daily. 10/29/20  Yes Ladell Pier, MD  latanoprost (XALATAN) 0.005 % ophthalmic solution Place 1 drop into the right eye at bedtime.   Yes [provider]  losartan (COZAAR) 100 MG tablet Take 100 mg by mouth daily. 10/08/20  Yes [provider]  megestrol (MEGACE) 40 MG tablet Take 1 tablet by mouth twice daily 10/24/20  Yes [name], MD  Menthol-Methyl Salicylate (MUSCLE RUB EX) Apply 1 application topically daily as needed (knee pain).    Yes [provider]  metoprolol (TOPROL-XL) 200 MG 24 hr tablet Take 1 tablet (200 mg total) by mouth daily. NEED OV. 03/20/21  Yes Croitoru, Mihai, MD  nitroGLYCERIN (NITROSTAT) 0.4 MG SL tablet Place 1 tablet (0.4 mg total) under the tongue every 5 (five) minutes as needed for chest pain. Max 3 doses in 15 minutes. <PLEASE MAKE APPOINTMENT FOR REFILLS> Patient taking differently: Place 0.4 mg under the tongue every 5 (five) minutes as needed for chest pain. 11/23/19  Yes Croitoru, Mihai, MD  pantoprazole (PROTONIX) 40 MG tablet TAKE ONE TABLET BY MOUTH ONCE DAILY Patient taking differently: Take 40 mg by mouth daily as needed (heartburn). 01/13/16  Yes Funches, Josalyn, MD  potassium chloride SA (KLOR-CON) 20 MEQ tablet TAKE 2 TABLETS BY MOUTH IN  THE MORNING AND 1 TABLET IN THE EVENING Patient taking differently: Take 20-40 mEq by mouth See admin instructions. Taking 2 tabs (40 MEQ) in the morning and  1 tab (20 MEQ) in the evening. 09/09/20  Yes Croitoru, Mihai, MD  Accu-Chek Softclix Lancets lancets Use as instructed for 3 times daily blood glucose monitoring 12/24/19   Ladell Pier, MD  acetaminophen (TYLENOL) 500 MG tablet Take 1 tablet (500 mg total) by mouth every 6 (six) hours as needed. Patient not taking: No sig reported 09/09/16   Clent Demark, PA-C  Blood Glucose Monitoring Suppl (ACCU-CHEK AVIVA PLUS) w/Device KIT Used as directed 09/29/18   Ladell Pier, MD  Blood  Pressure Monitoring (BLOOD PRESSURE MONITOR/ARM) DEVI 1 each by Does not apply route daily. ICD 10, I10 and I50.32 03/07/17   Funches, Adriana Mccallum, MD  Cyanocobalamin (VITAMIN B-12) 1000 MCG TABS Take 1,000 mcg by mouth daily. Patient not taking: Reported on 04/12/2021 09/15/17   Ladell Pier, MD  furosemide (LASIX) 80 MG tablet TAKE 1 TABLET BY MOUTH TWO  TIMES DAILY WITH FUROSEMIDE 40MG  (TOTAL DOSE OF 120MG  TWO TIMES DAILY ) Patient not taking: Reported on 04/12/2021 01/16/20   Ladell Pier, MD  glucose blood test strip Use TID before meals / Dx E11.9 09/29/18   Ladell Pier, MD  glucose monitoring kit (FREESTYLE) monitoring kit 1 each by Does not apply route 4 (four) times daily - after meals and at bedtime. 09/12/15   Boykin Nearing, MD  RELION INSULIN SYRINGE 31G X 15/64" 1 ML MISC USE AS DIRECTED TWICE DAILY FOR  INSULIN 02/11/20   Ladell Pier, MD    Allergies    Ativan [lorazepam] and Orange fruit [citrus]  Review of Systems   Review of Systems  Constitutional:  Negative for chills and fever.  HENT:  Negative for ear pain and sore throat.   Eyes:  Negative for pain and visual disturbance.  Respiratory:  Negative for cough and shortness  of breath.   Cardiovascular:  Positive for chest pain. Negative for palpitations.  Gastrointestinal:  Negative for abdominal pain and vomiting.  Genitourinary:  Negative for dysuria and hematuria.  Musculoskeletal:  Negative for arthralgias and back pain.  Skin:  Negative for color change and rash.  Neurological:  Negative for seizures and syncope.  All other systems reviewed and are negative.  Physical Exam Updated Vital Signs BP (!) 180/87   Pulse 81   Temp 99.1 F (37.3 C) (Oral)   Resp 15   LMP 04/02/2012   SpO2 100%   Physical Exam Vitals and nursing note reviewed.  Constitutional:      General: She is not in acute distress.    Appearance: Normal appearance. She is well-developed. She is obese.  HENT:     Head:  Normocephalic and atraumatic.  Eyes:     Conjunctiva/sclera: Conjunctivae normal.  Cardiovascular:     Rate and Rhythm: Normal rate and regular rhythm.     Heart sounds: No murmur heard. Pulmonary:     Effort: Pulmonary effort is normal. No respiratory distress.     Breath sounds: Normal breath sounds.  Abdominal:     Palpations: Abdomen is soft.     Tenderness: There is no abdominal tenderness.  Musculoskeletal:     Cervical back: Neck supple.     Right lower leg: Edema present.     Left lower leg: Edema present.  Skin:    General: Skin is warm and dry.     Capillary Refill: Capillary refill takes less than 2 seconds.  Neurological:     Mental Status: She is alert.    ED Results / Procedures / Treatments   Labs (all labs ordered are listed, but only abnormal results are displayed) Labs Reviewed  COMPREHENSIVE METABOLIC PANEL - Abnormal; Notable for the following components:      Result Value   CO2 19 (*)    Glucose, Bld 334 (*)    Creatinine, Ser 1.04 (*)    Calcium 8.8 (*)    Albumin 2.8 (*)    GFR, Estimated 59 (*)    All other components within normal limits  CBC  BRAIN NATRIURETIC PEPTIDE  TROPONIN I (HIGH SENSITIVITY)  TROPONIN I (HIGH SENSITIVITY)   EKG EKG Interpretation  Date/Time:  Sunday April 12 2021 17:30:06 EDT Ventricular Rate:  74 PR Interval:  153 QRS Duration: 132 QT Interval:  442 QTC Calculation: 491 R Axis:   15 Text Interpretation: Sinus rhythm Right bundle branch block Probable left ventricular hypertrophy Borderline prolonged QT interval No significant change since last tracing Confirmed by Isla Pence [phone]) on 04/12/2021 5:31:30 PM  Radiology DG Chest 2 View  Result Date: 04/12/2021 CLINICAL DATA:  Chest pain in a 64 year old female. EXAM: CHEST - 2 VIEW COMPARISON:  Dec 09, 2020. FINDINGS: EKG leads project over the chest. Trachea midline. Cardiac enlargement similar to the prior exam likely accentuated by portable technique.  Signs of vascular congestion. No lobar level consolidative process. No sign of pleural effusion. On limited assessment no acute skeletal process. IMPRESSION: Cardiomegaly and vascular congestion. Correlate with any signs of cardiac dysfunction. No effusion or lobar consolidation. Electronically Signed   By: Zetta Bills M.D.   On: 04/12/2021 18:40    Procedures Procedures   Medications Ordered in ED Medications  losartan (COZAAR) tablet 100 mg (100 mg Oral Given 04/12/21 2152)  labetalol (NORMODYNE) injection 10 mg (10 mg Intravenous Given 04/12/21 1914)  labetalol (NORMODYNE) injection 20 mg (20  mg Intravenous Given 04/12/21 2050)  diltiazem (CARDIZEM CD) 24 hr capsule 240 mg (240 mg Oral Given 04/12/21 2226)    ED Course  I have reviewed the triage vital signs and the nursing notes.  Pertinent labs & imaging results that were available during my care of the patient were reviewed by me and considered in my medical decision making (see chart for details).    MDM Rules/Calculators/A&P                           65 y.o. female with past medical history as above who presents for evaluation of chest pain. Afebrile and hemodynamically stable.  Exam as detailed above.  CBC is unremarkable.  CMP with mild non-anion gap metabolic acidosis with bicarbonate 19, as well as creatinine of 1.04, consistent with baseline.  BNP is normal.  Serial troponins are within normal limits as well.  EKG is normal sinus rhythm without evidence of acute ischemia or arrhythmia.  Chest x-ray shows cardiomegaly without pulmonary edema, pneumonia, or pneumothorax.  Cardiomegaly appears unchanged from prior x-ray studies upon my comparison.  Of note, patient was profoundly hypertensive here in the emergency department.  Patient states that she has been taking her prescribed metoprolol but been out of supply of the remainder of her antihypertensives.  Patient was treated here with 2 pushes of labetalol as well as p.o. doses of  her home antihypertensives.  Regarding alternative emergent etiologies of her chest pain, I have low suspicion for pulmonary embolism at this time.  Consideration was given to aortic dissection given her hypertensive state, chest pain, and left arm tingling.  After a period of observation with resolution of her chest pain and tingling, this diagnosis was felt to be less likely.  Presentation is most consistent for unstable angina, especially given the resolution of her pain with both aspirin and nitroglycerin.  We will plan for hospital admission for further cardiac work-up given history of CAD and need for provocative testing.  Final Clinical Impression(s) / ED Diagnoses Final diagnoses:  Chest pain, unspecified type    Rx / DC Orders ED Discharge Orders     None        Violet Baldy, MD 04/12/21 2241    Isla Pence, MD 04/12/21 2313

## 2021-04-12 NOTE — H&P (Addendum)
Date: 04/13/2021               Patient Name:  [name] MRN: [id number]  DOB: [birth year] Age / Sex: 65 y.o., female   PCP: Ladell Pier, MD         Medical Service: Internal Medicine Teaching Service         Attending Physician: Dr. Evette Doffing, Mallie Mussel, *    First Contact: Mitzie Na, MD Pager: MB [phone]  Second Contact: Sanjuan Dame, MD Pager: Rudean Curt [phone]       After Hours (After 5p/  First Contact Pager: [phone]  weekends / holidays): Second Contact Pager: [phone]   SUBJECTIVE   Chief Complaint: Chest Pain  History of Present Illness:   [name] is a 65 y/o F with a PMHx of T2DM, HTN, CKD IIIa, glaucoma, HfPEF, IDA brought to the ED by EMS after having episodes of chest pain. Chest pain/pressure started earlier today around 2 PM. The pain was constant  and she denies doing anything at that time. An hour prior to the chest pain occurring, she felt "icky"  describing it as feeling "kind of off." She had associated nausea, shortness of breath, and 1 episode of vomiting. She felt "sweaty" after symptom onset. She also had left eye pain and left arm pain. She states the left hand felt funny, which she has had happen to her in the past. Around 3pm she started  to also having pain in her back. She has a history of back pain which is not new but when the chest pain started, her back pain worsened. She received nitroglycerin, which helped relieve her chest and back pain. The nitro wore off and the pain returned. Her husband then called 911 and brought the patient to the hospital.   She also states she has headache like a band, this happens frequently in the evening times. Has been told in the past she needed to wear a CPAP at night, but refuses to wear them. States she has had nightly episodes of night sweats for the last few months. Denies weight loss. Sleeps on five pillows at night.  Misses medications 1-2 times a week.   ED Course:  Evaluated in ED,  troponins negative and EKG without acute changes from prior per ED resident. Requesting admission for ACS rule out  Meds:  Current Meds  Medication Sig   acetaminophen (TYLENOL) 500 MG tablet Take 500 mg by mouth every 6 (six) hours as needed for moderate pain.   acetaZOLAMIDE (DIAMOX) 500 MG capsule Take 1 capsule (500 mg total) by mouth 2 (two) times daily.   aspirin EC 81 MG tablet Take 81 mg by mouth daily.   atorvastatin (LIPITOR) 80 MG tablet TAKE 1 TABLET BY MOUTH ONCE DAILY IN THE EVENING AT 6  PM (Patient taking differently: Take 80 mg by mouth every evening.)   bismuth subsalicylate (PEPTO BISMOL) 262 MG/15ML suspension Take 30 mLs by mouth every 6 (six) hours as needed for indigestion.   brimonidine-timolol (COMBIGAN) 0.2-0.5 % ophthalmic solution Place 1 drop into both eyes 2 (two) times daily.    diltiazem (DILACOR XR) 240 MG 24 hr capsule Take 1 capsule (240 mg total) by mouth daily.   ferrous sulfate 325 (65 FE) MG tablet Take 1 tablet (325 mg total) by mouth daily with breakfast.   furosemide (LASIX) 40 MG tablet TAKE 1 TABLET BY MOUTH  TWICE DAILY (TAKE WITH  FUROSEMIDE 80 MG TAB) (Patient taking differently: Take 40  mg by mouth 2 (two) times daily.)   HUMALOG MIX 75/25 (75-25) 100 UNIT/ML SUSP injection INJECT SUBCUTANEOUSLY 50  UNITS TWICE DAILY WITH  MEALS (Patient taking differently: Inject 54 Units into the skin 2 (two) times daily with a meal.)   isosorbide mononitrate (IMDUR) 120 MG 24 hr tablet TAKE 1 TABLET BY MOUTH ONCE DAILY (Patient taking differently: Take 120 mg by mouth daily.)   latanoprost (XALATAN) 0.005 % ophthalmic solution Place 1 drop into the right eye at bedtime.   losartan (COZAAR) 100 MG tablet Take 100 mg by mouth daily.   megestrol (MEGACE) 40 MG tablet Take 1 tablet by mouth twice daily   Menthol-Methyl Salicylate (MUSCLE RUB EX) Apply 1 application topically daily as needed (knee pain).    metoprolol (TOPROL-XL) 200 MG 24 hr tablet Take 1 tablet (200  mg total) by mouth daily. NEED OV.   nitroGLYCERIN (NITROSTAT) 0.4 MG SL tablet Place 1 tablet (0.4 mg total) under the tongue every 5 (five) minutes as needed for chest pain. Max 3 doses in 15 minutes. <PLEASE MAKE APPOINTMENT FOR REFILLS> (Patient taking differently: Place 0.4 mg under the tongue every 5 (five) minutes as needed for chest pain.)   pantoprazole (PROTONIX) 40 MG tablet TAKE ONE TABLET BY MOUTH ONCE DAILY (Patient taking differently: Take 40 mg by mouth daily as needed (heartburn).)   potassium chloride SA (KLOR-CON) 20 MEQ tablet TAKE 2 TABLETS BY MOUTH IN  THE MORNING AND 1 TABLET IN THE EVENING (Patient taking differently: Take 20-40 mEq by mouth See admin instructions. Taking 2 tabs (40 MEQ) in the morning and  1 tab (20 MEQ) in the evening.)    Past Medical History:  Diagnosis Date   Blind right eye    Chronic back pain    "my whole back" (03/18/2015)   Chronic chest pain    takes isosorbide   Chronic diastolic heart failure ([place]) 9/13   echo 03/20/15 LV Ef of 60-65%, grade 2DD and  PA peak pressure: 36 mm Hg    CKD (chronic kidney disease), stage III (HCC)    Coronary artery disease    Dr Sallyanne Kuster, cardiac cath 02-07-2009 -- diffuse 3V CAD involving RCA, CFx, and D1,  pt not a candidate for bypass surgery or angioplasty,  medical management   Diabetic neuropathy ([place])    Diabetic retinopathy ([place])    bilateral proliferative   Diastolic CHF, chronic ([place])    Dyspnea    "always"   Edema of both lower extremities    Generalized weakness    GERD (gastroesophageal reflux disease)    Glaucoma, both eyes    History of non-ST elevation myocardial infarction (NSTEMI)    02-25-2009;  05-15-2009;  01-08-2010;  04-04-2012 this MI was post-op surgery for abscess on 04-03-2012   History of sepsis    Hyperlipemia    Hypertension    Insulin dependent type 2 diabetes mellitus ([place])    followed by pcp   Iron deficiency anemia due to chronic blood loss    uterine bleeding    Legally blind in left eye, as defined in [place]    per pt states vision "is foggy"   Mild obstructive sleep apnea    study in epic 04-19-2012 mild osa,  recommendation's were wt. loss, dental  appliance, surgery, or cpap   Morbid obesity with BMI of 40.0-44.9, adult ([place])    OA (osteoarthritis)    knees   PSVT (paroxysmal supraventricular tachycardia) ([place])    Renal insufficiency  pt. denies   Seasonal allergies    Wears glasses     Past Surgical History:  Procedure Laterality Date   BIOPSY  02/13/2018   Procedure: BIOPSY;  Surgeon: Ladene Artist, MD;  Location: [facility];  Service: Endoscopy;;   BREAST SURGERY Right 2019    breast biopsy,  per pt benign   CARDIAC CATHETERIZATION  02-27-2009   dr al little   diffuse 3V CAD , involving RCA, CFx, and D1;  inferior wall abnormalities, low normal ef 50%   CATARACT EXTRACTION Right    CATARACT EXTRACTION W/ INTRAOCULAR LENS IMPLANT Left 10-11-2007   @[facility]    COLONOSCOPY WITH PROPOFOL N/A 02/13/2018   Procedure: COLONOSCOPY WITH PROPOFOL;  Surgeon: Ladene Artist, MD;  Location: [facility];  Service: Endoscopy;  Laterality: N/A;   DILATION AND CURETTAGE OF UTERUS  x2 last one 2000   EYE SURGERY Bilateral 2009 to 2010   x2  left eye;  x2  right eye    HYSTEROSCOPY WITH D & C N/A 10/11/2018   Procedure: DILATATION AND CURETTAGE /HYSTEROSCOPY;  Surgeon: [name], MD;  Location: [facility];  Service: Gynecology;  Laterality: N/A;   INCISION AND DRAINAGE PERIRECTAL ABSCESS  04/03/2012   INTRAUTERINE DEVICE (IUD) INSERTION  10/11/2018   Procedure: INTRAUTERINE DEVICE (IUD) INSERTION;  Surgeon: [name], MD;  Location: [facility];  Service: Gynecology;;   IR US GUIDE VASC ACCESS LEFT  02/13/2018   IR VENIPUNCTURE 7YRS/OLDER BY MD  02/13/2018   POLYPECTOMY  02/13/2018   Procedure: POLYPECTOMY;  Surgeon: Ladene Artist, MD;  Location: [facility];  Service: Endoscopy;;    Social:  Lives With: Husband Occupation: retired Level of  Function: Uses walker and cane PCP: Ladell Pier, MD Substances: Denies tobacco, alcohol, or other substances.   Family History:  Father - MI, first in his 42's Mother - Breast Cancer Brother - MI, uncertain of age.   Allergies: Allergies as of 04/12/2021 - Review Complete 04/12/2021  Allergen Reaction Noted   Ativan [lorazepam] Anaphylaxis 01/15/2012   Orange fruit [citrus] Anaphylaxis and Other (See Comments) 04/10/2012    Review of Systems: A complete ROS was negative except as per HPI.   OBJECTIVE:   Physical Exam: Blood pressure (!) 208/80, pulse 80, temperature 99.1 F (37.3 C), temperature source Oral, resp. rate 16, last menstrual period 04/02/2012, SpO2 100 %.  Constitutional: obese elderly woman resting comfortably in bed, in no acute distress HENT: normocephalic atraumatic, mucous membranes moist Eyes: conjunctiva non-erythematous Neck: supple Cardiovascular: regular rate and rhythm, no m/r/g,1 + pitting edema bilaterally, no JVD Pulmonary/Chest: normal work of breathing on room air, lungs clear to auscultation bilaterally, diminished breath sounds 2/2 body habitus Abdominal: soft, non-tender, non-distended MSK: tender to palpation over the chest diffusely, tenderness to palpation in the LUE Neurological: alert & oriented x 3, normal sensation in extremities, normal strength in BUE Skin: warm and dry Psych: normal affect  Labs: CBC    Component Value Date/Time   WBC 9.1 04/12/2021 1853   RBC 3.99 04/12/2021 1853   HGB 12.4 04/12/2021 1853   HGB 13.3 12/24/2019 1049   HCT 37.9 04/12/2021 1853   HCT 40.9 12/24/2019 1049   PLT 243 04/12/2021 1853   PLT 244 12/24/2019 1049   MCV 95.0 04/12/2021 1853   MCV 95 12/24/2019 1049   MCH 31.1 04/12/2021 1853   MCHC 32.7 04/12/2021 1853   RDW 13.1 04/12/2021 1853   RDW 11.9 12/24/2019 1049   LYMPHSABS  0.9 12/08/2020 0837   MONOABS 0.8 12/08/2020 0837   EOSABS 0.0 12/08/2020 0837   BASOSABS 0.0 12/08/2020  0837     CMP     Component Value Date/Time   NA 135 04/12/2021 1853   NA 141 12/24/2019 1049   K 3.7 04/12/2021 1853   CL 107 04/12/2021 1853   CO2 19 (L) 04/12/2021 1853   GLUCOSE 334 (H) 04/12/2021 1853   BUN 10 04/12/2021 1853   BUN 17 12/24/2019 1049   CREATININE 1.04 (H) 04/12/2021 1853   CREATININE 1.30 (H) 09/28/2016 1004   CALCIUM 8.8 (L) 04/12/2021 1853   PROT 6.6 04/12/2021 1853   PROT 7.3 12/24/2019 1049   ALBUMIN 2.8 (L) 04/12/2021 1853   ALBUMIN 3.8 12/24/2019 1049   AST 15 04/12/2021 1853   ALT 12 04/12/2021 1853   ALKPHOS 45 04/12/2021 1853   BILITOT 0.6 04/12/2021 1853   BILITOT 0.4 12/24/2019 1049   GFRNONAA 59 (L) 04/12/2021 1853   GFRNONAA 44 (L) 09/28/2016 1004   GFRAA 52 (L) 01/25/2020 0542   GFRAA 51 (L) 09/28/2016 1004    Imaging: DG Chest 2 View  Result Date: 04/12/2021 CLINICAL DATA:  Chest pain in a 65 year old female. EXAM: CHEST - 2 VIEW COMPARISON:  Dec 09, 2020. FINDINGS: EKG leads project over the chest. Trachea midline. Cardiac enlargement similar to the prior exam likely accentuated by portable technique. Signs of vascular congestion. No lobar level consolidative process. No sign of pleural effusion. On limited assessment no acute skeletal process. IMPRESSION: Cardiomegaly and vascular congestion. Correlate with any signs of cardiac dysfunction. No effusion or lobar consolidation. Electronically Signed   By: Zetta Bills M.D.   On: 04/12/2021 18:40    EKG: personally reviewed my interpretation is sinus rhythm, normal rate, normal axis, slightly prolonged QT, T wave inversions V1-5, unchanged from prior EKG   ASSESSMENT & PLAN:    Assessment & Plan by Problem: Active Problems:   Chest pain   [name] is a 65 y/o F with a PMHx of T2DM, HTN, CKD IIIa, glaucoma, and HFpEF brought to the ED by EMS after having episodes of chest pain.   #Atypical Chest Pain; ACS rule out #CAD hx of Angina pectoris  Patient is high risk for ACS  with several prior NSTEMIs (02-25-2009; 05-15-2009; 01-08-2010; 04-04-2012). Patient also has reproducible MSK chest pain on exam, troponins were normal. Pain was improving on time of interview. EKG unchanged from prior, patient's vitals stable - HEART score of 6 - nitro PRN - CTM for increasing chest pain - f/u echo - consider cardiology consult or left heart cath if echo abnormal -continue daily aspirin - last cardiac cath 2010, 3 vessel disease of circumflex, RCA, and D1. Deemed little benefit of intervention at that time.  #Chronic HFpEF Patient reports intermittent medication non-adherence and says that she is supposed to take 40 and 80 mg  of furosemide daily but only takes 40 and 40 because she was peeing too much. Patient is at same weight of last discharge of 129.4 kg but unclear if this is her actual dry weight. Lungs CTAB but patient says her BLE edema is worse than it usually is. Suspect patient is slightly volume up on exam. Will order echo to assess for worsening of heart function.   - daily weights - f/u echo - continue metoprolol succinate 200 daily - continue diltiazem 240 daily - lasix 40 BID; consider increasing to 40, 80 if UOP is insufficient -continue home potassium 40  daily -strict I/O - PT/ OT eval and treat  #HTN Patient with elevated BP on presentation to the ED up to the 200s, suspect pressures are not actually that elevated as patient is not using correct cuff size. Will reach out to nursing staff to use proper cuff - continue home isosorbide mononitrate 120 mg daily (holding today 2/2 receiving other doses of nitrate) - continue losartan 100 mg daily - CTM blood pressure   #OSA Reports a history of being told to use a CPAP multiple times, frequently wakes up at night with headaches. Patient does not use CPAP at this time  #complex cystic left ovarian mass and moderate L hydroureteronephrosis  #history of complex atypical endometrial hyperplasia  Per chart  review patient had previously had an IUD for treatment of hyperplasia, patient does not remember if she is taking oral progesterone  #T2DM -SSI -f/u HA1c  #CKD IIIa Cr improved from baseline of about 1.4 -continue home losartan 100  GERD - continue home pantoprazole 40  HLD - continue daily atorvastatin 80  Glaucoma - continue home latanoprost, brimonidine, timolol  Diet: Heart Healthy VTE: Enoxaparin IVF: None,None Code: Full  Prior to Admission Living Arrangement: Home, living with husband Anticipated Discharge Location: Home Barriers to Discharge: ACS rule out  Dispo: Admit patient to Inpatient with expected length of stay greater than 2 midnights.  Signed: Scarlett Presto, MD Internal Medicine Resident PGY-1 Pager: [phone]  04/13/2021, 12:52 AM

## 2021-04-13 ENCOUNTER — Inpatient Hospital Stay (HOSPITAL_BASED_OUTPATIENT_CLINIC_OR_DEPARTMENT_OTHER): Payer: Medicare Other

## 2021-04-13 ENCOUNTER — Encounter (HOSPITAL_COMMUNITY): Payer: Self-pay | Admitting: Student in an Organized Health Care Education/Training Program

## 2021-04-13 DIAGNOSIS — R0789 Other chest pain: Secondary | ICD-10-CM | POA: Diagnosis not present

## 2021-04-13 DIAGNOSIS — R079 Chest pain, unspecified: Secondary | ICD-10-CM

## 2021-04-13 LAB — CBC WITH DIFFERENTIAL/PLATELET
Abs Immature Granulocytes: 0.04 10*3/uL (ref 0.00–0.07)
Basophils Absolute: 0 10*3/uL (ref 0.0–0.1)
Basophils Relative: 0 %
Eosinophils Absolute: 0.1 10*3/uL (ref 0.0–0.5)
Eosinophils Relative: 1 %
HCT: 37.3 % (ref 36.0–46.0)
Hemoglobin: 12.3 g/dL (ref 12.0–15.0)
Immature Granulocytes: 0 %
Lymphocytes Relative: 17 %
Lymphs Abs: 1.5 10*3/uL (ref 0.7–4.0)
MCH: 31.1 pg (ref 26.0–34.0)
MCHC: 33 g/dL (ref 30.0–36.0)
MCV: 94.2 fL (ref 80.0–100.0)
Monocytes Absolute: 0.7 10*3/uL (ref 0.1–1.0)
Monocytes Relative: 7 %
Neutro Abs: 6.7 10*3/uL (ref 1.7–7.7)
Neutrophils Relative %: 75 %
Platelets: 243 10*3/uL (ref 150–400)
RBC: 3.96 MIL/uL (ref 3.87–5.11)
RDW: 13 % (ref 11.5–15.5)
WBC: 9.1 10*3/uL (ref 4.0–10.5)
nRBC: 0 % (ref 0.0–0.2)

## 2021-04-13 LAB — BASIC METABOLIC PANEL
Anion gap: 7 (ref 5–15)
BUN: 9 mg/dL (ref 8–23)
CO2: 18 mmol/L — ABNORMAL LOW (ref 22–32)
Calcium: 8.5 mg/dL — ABNORMAL LOW (ref 8.9–10.3)
Chloride: 111 mmol/L (ref 98–111)
Creatinine, Ser: 0.81 mg/dL (ref 0.44–1.00)
GFR, Estimated: >60 mL/min (ref >60)
Glucose, Bld: 266 mg/dL — ABNORMAL HIGH (ref 70–99)
Potassium: 4.8 mmol/L (ref 3.5–5.1)
Sodium: 136 mmol/L (ref 135–145)

## 2021-04-13 LAB — LIPID PANEL
Cholesterol: 160 mg/dL (ref 0–200)
HDL: 28 mg/dL — ABNORMAL LOW (ref >40)
LDL Cholesterol: 109 mg/dL — ABNORMAL HIGH (ref 0–99)
Total CHOL/HDL Ratio: 5.7 RATIO
Triglycerides: 116 mg/dL (ref <150)
VLDL: 23 mg/dL (ref 0–40)

## 2021-04-13 LAB — HEMOGLOBIN A1C
Hgb A1c MFr Bld: 8.3 % — ABNORMAL HIGH (ref 4.8–5.6)
Mean Plasma Glucose: 191.51 mg/dL

## 2021-04-13 LAB — CBG MONITORING, ED
Glucose-Capillary: 282 mg/dL — ABNORMAL HIGH (ref 70–99)
Glucose-Capillary: 266 mg/dL — ABNORMAL HIGH (ref 70–99)
Glucose-Capillary: 229 mg/dL — ABNORMAL HIGH (ref 70–99)

## 2021-04-13 LAB — GLUCOSE, CAPILLARY: Glucose-Capillary: 227 mg/dL — ABNORMAL HIGH (ref 70–99)

## 2021-04-13 LAB — ECHOCARDIOGRAM COMPLETE
AR max vel: 2.11 cm2
AV Area VTI: 1.96 cm2
AV Area mean vel: 2.15 cm2
AV Mean grad: 3 mmHg
AV Peak grad: 7 mmHg
Ao pk vel: 1.32 m/s
Area-P 1/2: 2.27 cm2
MV VTI: 1.46 cm2
S' Lateral: 2.5 cm

## 2021-04-13 LAB — MRSA NEXT GEN BY PCR, NASAL: MRSA by PCR Next Gen: NOT DETECTED

## 2021-04-13 LAB — HIV ANTIBODY (ROUTINE TESTING W REFLEX): HIV Screen 4th Generation wRfx: NONREACTIVE

## 2021-04-13 LAB — SARS CORONAVIRUS 2 (TAT 6-24 HRS): SARS Coronavirus 2: NEGATIVE

## 2021-04-13 MED ORDER — LOSARTAN POTASSIUM 50 MG PO TABS
100.0000 mg | ORAL_TABLET | Freq: Every day | ORAL | Status: DC
  Administered 2021-04-13 – 2021-04-15 (×3): 100 mg via ORAL
  Filled 2021-04-13 (×3): qty 2

## 2021-04-13 MED ORDER — POTASSIUM CHLORIDE CRYS ER 20 MEQ PO TBCR
40.0000 meq | EXTENDED_RELEASE_TABLET | Freq: Every day | ORAL | Status: DC
  Administered 2021-04-14 – 2021-04-15 (×2): 40 meq via ORAL
  Filled 2021-04-13 (×3): qty 2

## 2021-04-13 MED ORDER — INSULIN ASPART 100 UNIT/ML IJ SOLN
0.0000 [IU] | Freq: Three times a day (TID) | INTRAMUSCULAR | Status: DC
  Administered 2021-04-13: 5 [IU] via SUBCUTANEOUS
  Administered 2021-04-13 (×2): 8 [IU] via SUBCUTANEOUS
  Administered 2021-04-14: 5 [IU] via SUBCUTANEOUS
  Administered 2021-04-14 (×2): 11 [IU] via SUBCUTANEOUS
  Administered 2021-04-15: 5 [IU] via SUBCUTANEOUS

## 2021-04-13 MED ORDER — ASPIRIN EC 81 MG PO TBEC
81.0000 mg | DELAYED_RELEASE_TABLET | Freq: Every day | ORAL | Status: DC
  Administered 2021-04-13 – 2021-04-15 (×3): 81 mg via ORAL
  Filled 2021-04-13 (×3): qty 1

## 2021-04-13 MED ORDER — ISOSORBIDE MONONITRATE ER 60 MG PO TB24
120.0000 mg | ORAL_TABLET | Freq: Every day | ORAL | Status: DC
  Administered 2021-04-13 – 2021-04-15 (×3): 120 mg via ORAL
  Filled 2021-04-13: qty 4
  Filled 2021-04-13 (×2): qty 2

## 2021-04-13 MED ORDER — ATORVASTATIN CALCIUM 80 MG PO TABS
80.0000 mg | ORAL_TABLET | Freq: Every evening | ORAL | Status: DC
  Administered 2021-04-13: 80 mg via ORAL
  Filled 2021-04-13 (×2): qty 1

## 2021-04-13 MED ORDER — ENOXAPARIN SODIUM 40 MG/0.4ML IJ SOSY
40.0000 mg | PREFILLED_SYRINGE | Freq: Every day | INTRAMUSCULAR | Status: DC
  Administered 2021-04-13 – 2021-04-14 (×2): 40 mg via SUBCUTANEOUS
  Filled 2021-04-13 (×2): qty 0.4

## 2021-04-13 MED ORDER — POTASSIUM CHLORIDE CRYS ER 20 MEQ PO TBCR
40.0000 meq | EXTENDED_RELEASE_TABLET | Freq: Every day | ORAL | Status: DC
  Administered 2021-04-13: 40 meq via ORAL
  Filled 2021-04-13: qty 2

## 2021-04-13 MED ORDER — ACETAMINOPHEN 650 MG RE SUPP: 650.0000 mg | Freq: Four times a day (QID) | RECTAL | Status: DC | PRN

## 2021-04-13 MED ORDER — BRIMONIDINE TARTRATE 0.2 % OP SOLN
1.0000 [drp] | Freq: Two times a day (BID) | OPHTHALMIC | Status: DC
  Administered 2021-04-13 – 2021-04-14 (×3): 1 [drp] via OPHTHALMIC
  Filled 2021-04-13 (×2): qty 5

## 2021-04-13 MED ORDER — ACETAMINOPHEN 325 MG PO TABS
650.0000 mg | ORAL_TABLET | Freq: Four times a day (QID) | ORAL | Status: DC | PRN
  Administered 2021-04-13 (×2): 650 mg via ORAL
  Filled 2021-04-13 (×2): qty 2

## 2021-04-13 MED ORDER — FUROSEMIDE 40 MG PO TABS
40.0000 mg | ORAL_TABLET | Freq: Two times a day (BID) | ORAL | Status: DC
  Administered 2021-04-13 – 2021-04-15 (×4): 40 mg via ORAL
  Filled 2021-04-13 (×4): qty 1
  Filled 2021-04-13: qty 2

## 2021-04-13 MED ORDER — LATANOPROST 0.005 % OP SOLN
1.0000 [drp] | Freq: Every day | OPHTHALMIC | Status: DC
  Administered 2021-04-13 – 2021-04-14 (×3): 1 [drp] via OPHTHALMIC
  Filled 2021-04-13 (×2): qty 2.5

## 2021-04-13 MED ORDER — DILTIAZEM HCL ER COATED BEADS 240 MG PO CP24
240.0000 mg | ORAL_CAPSULE | Freq: Every day | ORAL | Status: DC
  Administered 2021-04-13 – 2021-04-15 (×3): 240 mg via ORAL
  Filled 2021-04-13 (×3): qty 1

## 2021-04-13 MED ORDER — PANTOPRAZOLE SODIUM 40 MG PO TBEC
40.0000 mg | DELAYED_RELEASE_TABLET | Freq: Every day | ORAL | Status: DC
  Administered 2021-04-13 – 2021-04-14 (×2): 40 mg via ORAL
  Filled 2021-04-13 (×2): qty 1

## 2021-04-13 MED ORDER — NITROGLYCERIN 0.4 MG SL SUBL: 0.4000 mg | SUBLINGUAL_TABLET | SUBLINGUAL | Status: DC | PRN

## 2021-04-13 MED ORDER — INFLUENZA VAC A&B SA ADJ QUAD 0.5 ML IM PRSY
0.5000 mL | PREFILLED_SYRINGE | INTRAMUSCULAR | Status: DC
  Filled 2021-04-13: qty 0.5

## 2021-04-13 MED ORDER — METOPROLOL SUCCINATE ER 100 MG PO TB24
200.0000 mg | ORAL_TABLET | Freq: Every day | ORAL | Status: DC
  Administered 2021-04-13 – 2021-04-15 (×3): 200 mg via ORAL
  Filled 2021-04-13 (×2): qty 2
  Filled 2021-04-13: qty 8

## 2021-04-13 MED ORDER — TIMOLOL MALEATE 0.5 % OP SOLN
1.0000 [drp] | Freq: Two times a day (BID) | OPHTHALMIC | Status: DC
  Administered 2021-04-13 – 2021-04-14 (×4): 1 [drp] via OPHTHALMIC
  Filled 2021-04-13 (×2): qty 5

## 2021-04-13 MED ORDER — BRIMONIDINE TARTRATE-TIMOLOL 0.2-0.5 % OP SOLN: 1.0000 [drp] | Freq: Two times a day (BID) | OPHTHALMIC | Status: DC

## 2021-04-13 NOTE — Progress Notes (Signed)
Patient arrived to room 2C 17, CHG bath and MRSA swab obtained, Vital signs obtained and patient placed on monitors. Dinner tray came with her from the ED, patient sitting at the bedside eating dinner

## 2021-04-13 NOTE — Progress Notes (Signed)
Echo attempted. Patient eating. Will attempt again as time and schedule permits.

## 2021-04-13 NOTE — Progress Notes (Signed)
Pt refused CPAP tonight.

## 2021-04-13 NOTE — ED Notes (Signed)
Pt alert, NAD, calm, interactive resps e/u, speaking in clear complete sentences, VSS, BP high, pure wick in place, meds given.

## 2021-04-13 NOTE — ED Notes (Signed)
[name] husband [phone] would like an update on the patient

## 2021-04-13 NOTE — Plan of Care (Signed)

## 2021-04-13 NOTE — Progress Notes (Signed)
  Echocardiogram 2D Echocardiogram has been attempted. Patient with multiple providers at time of attempt. Will attempt again as time and schedule permits.  [name] 04/13/2021, 9:16 AM

## 2021-04-13 NOTE — Progress Notes (Signed)
  Echocardiogram 2D Echocardiogram has been performed.  [name] 04/13/2021, 4:28 PM

## 2021-04-13 NOTE — Progress Notes (Addendum)
Subjective:  HD 0   Patient was seen at bedside this morning.  She denied chest pain since admission.  Patient was admitted for sharp, recurrent chest pain at rest that started yesterday.  Reported that the pain was immense and sharp in nature, and improved temporarily with her husband's nitroglycerin. Reported that the pain radiated to her left shoulder and down to her left fingers.  She admitted to selective adherence to her medication, except for her insulin. Discussed with patient the importance of staying consistent with her medical regiment. Also reported dizziness when she walks. Reported tolerating about walking 10 feet before feeling dizzy.  Patient also was concerned for trigger finger bilaterally, with the left worse than the right. Lastly, patient requested that we call her husband for updates.  Otherwise she has no other concerns today.  Objective:  Vital signs in last 24 hours: Vitals:   04/13/21 1200 04/13/21 1230 04/13/21 1300 04/13/21 1330  BP: (!) 163/70 (!) 150/69 (!) 160/70 (!) 148/79  Pulse: 66 65 65 66  Resp: 17 17 19 14   Temp:      TempSrc:      SpO2: 98% 99% 99% 98%   Physical Exam Vitals and nursing note reviewed.  Constitutional:      General: She is not in acute distress.    Appearance: She is not diaphoretic.  HENT:     Head: Normocephalic.  Cardiovascular:     Rate and Rhythm: Normal rate and regular rhythm.     Heart sounds: Normal heart sounds. No murmur heard.   No friction rub. No gallop.  Pulmonary:     Effort: Pulmonary effort is normal.     Breath sounds: Normal breath sounds.  Chest:     Chest wall: Tenderness (mild) present.  Abdominal:     Palpations: Abdomen is soft.     Tenderness: There is no abdominal tenderness.  Musculoskeletal:     Right lower leg: No tenderness. Edema (1+ pitting) present.     Left lower leg: No tenderness. Edema (1+ pitting) present.  Skin:    General: Skin is warm and dry.  Neurological:     Mental Status:  She is alert and oriented to person, place, and time.  Psychiatric:        Mood and Affect: Mood normal.        Behavior: Behavior normal.   Assessment/Plan: [name] is a 65 y/o F living with ischemic heart disease, T2DM, HTN, CKD IIIa, glaucoma, severe obesity, and HFpEF presented to ED for recurrent chest pains and was ruled out for ACS.   Principal Problem:   Chest pain Active Problems:   Essential hypertension   Coronary atherosclerosis-not a surgical or PCI candidate 2010   Severe obesity (BMI >= 40) (HCC)   Presumed OSA (obstructive sleep apnea)   Dyslipidemia   Controlled type 2 diabetes mellitus with complication, with long-term current use of insulin (HCC)   Chronic diastolic heart failure ([place])  #Atypical Chest Pain; ACS rule out #CAD hx of Angina pectoris  #HLD Patient is high risk for ACS with several prior NSTEMIs (02-25-2009; 05-15-2009; 01-08-2010; 04-04-2012). Last cardiac cath 2010, 3 vessel disease of circumflex, RCA, and D1. She denied chest pain and associating symptoms since admission.  - Echo today to eval for structural heart disease - Continue daily aspirin 81mg  - Continue daily atorvastatin 80mg  - Continue home isosorbide mononitrate 120 mg daily  - Metoprolol 200mg  daily and losartan 100mg  daily - Continue Nitro PRN  #  Chronic HFpEF Patient admitted to selective adherence to medications. Patient is at same weight of last discharge of 129.4 kg but unclear if this is her actual dry weight. Patient has BLE +1 pitting edema. Will start patient on home lasix 40mg  BID, and consider increasing to 80mg  urinary output is insufficient. - F/u ECHO - Continue metoprolol succinate 200 daily - Continue diltiazem 240 daily - Continue lasix 40 BID - Continue home potassium 40 daily - Strict I/O - Daily weights - PT/ OT eval and treat   #HTN Patient with elevated BP during interview with no symptoms, however she has not received home antihypertensive today  yet.  - Continue losartan 100 mg daily - CTM blood pressure    #OSA Reports a history of being told to use a CPAP multiple times, frequently wakes up at night with headaches. Patient does not use CPAP at this time. Will order CPAP, and encourage use. - CPAP qHS   #T2DM Patient's diabetes is not well controlled, HbA1c 8.3 today, and last CBG 266 - Continue novolog - SSI   #CKD IIIa Cr 0.81 today. - Continue home losartan 100   #GERD - Continue home pantoprazole 40   #Glaucoma - Continue home latanoprost, brimonidine, timolol  Prior to Admission Living Arrangement: Home Anticipated Discharge Location: Home Code status: Full Barriers to Discharge: Medical management Dispo: Anticipated discharge in approximately 2 day(s).   Merrily Brittle, DO 04/13/2021, 3:13 PM Pager: [phone] After 5pm on weekdays and 1pm on weekends: On Call pager [phone]

## 2021-04-14 ENCOUNTER — Other Ambulatory Visit (HOSPITAL_COMMUNITY): Payer: Self-pay

## 2021-04-14 DIAGNOSIS — R079 Chest pain, unspecified: Secondary | ICD-10-CM | POA: Diagnosis not present

## 2021-04-14 LAB — BASIC METABOLIC PANEL
Anion gap: 6 (ref 5–15)
BUN: 9 mg/dL (ref 8–23)
CO2: 23 mmol/L (ref 22–32)
Calcium: 8.9 mg/dL (ref 8.9–10.3)
Chloride: 107 mmol/L (ref 98–111)
Creatinine, Ser: 1.05 mg/dL — ABNORMAL HIGH (ref 0.44–1.00)
GFR, Estimated: 59 mL/min — ABNORMAL LOW (ref >60)
Glucose, Bld: 271 mg/dL — ABNORMAL HIGH (ref 70–99)
Potassium: 4 mmol/L (ref 3.5–5.1)
Sodium: 136 mmol/L (ref 135–145)

## 2021-04-14 LAB — GLUCOSE, CAPILLARY
Glucose-Capillary: 241 mg/dL — ABNORMAL HIGH (ref 70–99)
Glucose-Capillary: 307 mg/dL — ABNORMAL HIGH (ref 70–99)
Glucose-Capillary: 315 mg/dL — ABNORMAL HIGH (ref 70–99)
Glucose-Capillary: 246 mg/dL — ABNORMAL HIGH (ref 70–99)

## 2021-04-14 MED ORDER — "SAFETY INSULIN SYRINGES 30G X 1/2"" 1 ML MISC"
6 refills | Status: DC
  Filled 2021-04-14: qty 100, fill #0

## 2021-04-14 MED ORDER — HUMALOG MIX 75/25 (75-25) 100 UNIT/ML ~~LOC~~ SUSP
54.0000 [IU] | Freq: Two times a day (BID) | SUBCUTANEOUS | 3 refills | Status: DC
  Filled 2021-04-14: qty 10, 9d supply, fill #0

## 2021-04-14 MED ORDER — ASPIRIN 81 MG PO TBEC
81.0000 mg | DELAYED_RELEASE_TABLET | Freq: Every day | ORAL | 11 refills | Status: DC
  Filled 2021-04-14: qty 30, 30d supply, fill #0

## 2021-04-14 MED ORDER — ISOSORBIDE MONONITRATE ER 60 MG PO TB24
120.0000 mg | ORAL_TABLET | Freq: Every day | ORAL | 2 refills | Status: DC
  Filled 2021-04-14: qty 60, 30d supply, fill #0

## 2021-04-14 MED ORDER — NITROGLYCERIN 0.4 MG SL SUBL
SUBLINGUAL_TABLET | SUBLINGUAL | 0 refills | Status: AC
  Filled 2021-04-14: qty 25, 7d supply, fill #0

## 2021-04-14 MED ORDER — GLUCOSE BLOOD VI STRP
ORAL_STRIP | 12 refills | Status: AC
  Filled 2021-04-14: qty 100, 33d supply, fill #0

## 2021-04-14 MED ORDER — MUSCLE RUB 10-15 % EX CREA
1.0000 "application " | TOPICAL_CREAM | Freq: Every day | CUTANEOUS | 0 refills | Status: AC | PRN
  Filled 2021-04-14: qty 35, 35d supply, fill #0

## 2021-04-14 MED ORDER — ATORVASTATIN CALCIUM 80 MG PO TABS
ORAL_TABLET | ORAL | 3 refills | Status: DC
  Filled 2021-04-14: qty 30, 30d supply, fill #0

## 2021-04-14 MED ORDER — LATANOPROST 0.005 % OP SOLN
1.0000 [drp] | Freq: Every day | OPHTHALMIC | 12 refills | Status: DC
  Filled 2021-04-14: qty 2.5, 50d supply, fill #0

## 2021-04-14 MED ORDER — HUMALOG MIX 75/25 (75-25) 100 UNIT/ML ~~LOC~~ SUSP: 54.0000 [IU] | Freq: Two times a day (BID) | SUBCUTANEOUS | 3 refills | Status: DC

## 2021-04-14 MED ORDER — MEGESTROL ACETATE 40 MG PO TABS
40.0000 mg | ORAL_TABLET | Freq: Two times a day (BID) | ORAL | 0 refills | Status: DC
  Filled 2021-04-14: qty 60, 30d supply, fill #0

## 2021-04-14 MED ORDER — PANTOPRAZOLE SODIUM 40 MG PO TBEC
40.0000 mg | DELAYED_RELEASE_TABLET | Freq: Every day | ORAL | 2 refills | Status: DC
  Filled 2021-04-14: qty 30, 30d supply, fill #0

## 2021-04-14 MED ORDER — BRIMONIDINE TARTRATE-TIMOLOL 0.2-0.5 % OP SOLN
1.0000 [drp] | Freq: Two times a day (BID) | OPHTHALMIC | 0 refills | Status: AC
  Filled 2021-04-14: qty 15, 75d supply, fill #0

## 2021-04-14 MED ORDER — METOPROLOL SUCCINATE ER 200 MG PO TB24
200.0000 mg | ORAL_TABLET | Freq: Every day | ORAL | 0 refills | Status: DC
  Filled 2021-04-14: qty 30, 30d supply, fill #0

## 2021-04-14 MED ORDER — FUROSEMIDE 40 MG PO TABS
40.0000 mg | ORAL_TABLET | Freq: Two times a day (BID) | ORAL | 3 refills | Status: DC
  Filled 2021-04-14: qty 60, 30d supply, fill #0

## 2021-04-14 MED ORDER — POTASSIUM CHLORIDE CRYS ER 20 MEQ PO TBCR
40.0000 meq | EXTENDED_RELEASE_TABLET | Freq: Every day | ORAL | 3 refills | Status: DC
  Filled 2021-04-14: qty 180, 90d supply, fill #0

## 2021-04-14 MED ORDER — ACETAZOLAMIDE ER 500 MG PO CP12
500.0000 mg | ORAL_CAPSULE | Freq: Two times a day (BID) | ORAL | 3 refills | Status: DC
  Filled 2021-04-14: qty 60, 30d supply, fill #0

## 2021-04-14 MED ORDER — DILTIAZEM HCL ER COATED BEADS 240 MG PO CP24
240.0000 mg | ORAL_CAPSULE | Freq: Every day | ORAL | 0 refills | Status: DC
  Filled 2021-04-14: qty 30, 30d supply, fill #0

## 2021-04-14 MED ORDER — INSULIN ASPART PROT & ASPART (70-30 MIX) 100 UNIT/ML ~~LOC~~ SUSP: 28.0000 [IU] | Freq: Two times a day (BID) | SUBCUTANEOUS | 0 refills | Status: DC

## 2021-04-14 MED ORDER — LOSARTAN POTASSIUM 100 MG PO TABS
100.0000 mg | ORAL_TABLET | Freq: Every day | ORAL | 0 refills | Status: DC
  Filled 2021-04-14: qty 30, 30d supply, fill #0

## 2021-04-14 NOTE — Progress Notes (Signed)
[place] visited pt. per [facility] consult for assistance w/AD.  Pt. sitting in recliner at bedside.  She shared that she was admitted on Thursday for what she believed at the time was a heart attack but has since been found to be a different cardiac problem.  Pt. is eager to return home as soon as possible.  Pt. says she would like to think about completing AD; [place] provided education and left document w/pt. for her perusal.  Pt. says her husband would be the main decisionmaker for her but she is unsure who to name as alt. HCPOA if he were to be unavailable.  Pt. is aware of chaplains' availability to assist further if needed.  Lindaann Pascal, Chaplain Pager: [phone]

## 2021-04-14 NOTE — Discharge Instructions (Signed)
Dear [name], I am so glad you are feeling better and can be discharged today! You were admitted because of chest pain because of your coronary artery disease.   Please see the following instructions: Please get all of your medicines together, and in a place where they are easily accessed to you will remember. There has been no changes to her home medicines, please take them as instructed prior.  Please remember that these are lifetime supplements now, please take them every day. Please schedule an appointment with your family doctor. Please schedule appointment with your OB/GYN about your ovary.  I know that all of this is scary right now, but they are trying to work success.  You got this, just 1 thing at a time.  It was a pleasure meeting you, [name]. I wish you and your family the best, and hope you stay happy and healthy!  Best, Merrily Brittle, DO Psychiatry intern

## 2021-04-14 NOTE — Consult Note (Signed)
   [facility] CM Inpatient Consult   04/14/2021  INIS BORNEMAN 06-07-1956 [id number]  [employer] [ACO] Patient:  [employer] Medicare  Primary Care Provider:  Ladell Pier, MD, [facility] patient endorses, this provider is listed for the [facility] follow up calls and appointments.   Referral from inpatient [facility] RNCM for post hospital follow up support, as patient was observation status.   Patient evaluated for community based chronic complex disease management services with [place] [facility] as a benefit of patient's [employer]. Spoke with patient at bedside HIPAA verified with patient's name, date of birth  to explain [place] [facility] services.   Explained that the patient qualifies for complex disease management as a benefit.  Patient states her husband is her transportation, she states states that she was alright with the referral for [place] [facility] follow up when returning to community.  She states she is in agreement for [facility] staff to speak with her husband as well as her.  Plan:  Unable to place order for [facility] Care Management was blocked.  04/15/21 0845 am inpatient [facility] RN discussion on additional barriers to care and that patient did not go home.   0930 Was able to speak with patient and husband [name] at the bedside.  He states he was alright with [facility] Care Management following up. He had concerns for [facility] potential cost.  Ongoing attempts to place orders is not leeting this Probation officer to place referral orders again.  Will attempt orders in a different manaer to assist patient with post hospital needs.  Requested inpatient [employer] RNCM to request for home health social worker.   Patient will receive post hospital discharge call and will be evaluated for complex disease management.      Of note, [facility] Care Management services does not replace or interfere with any services that are arranged by inpatient  case management or social work.  For additional questions or referrals please contact:    Natividad Brood, RN BSN [facility] Liaison  [phone] business mobile phone Toll free office [phone]  Fax number: [phone] [name]

## 2021-04-14 NOTE — Progress Notes (Signed)
I have explained to the patient that all of her TOC meds are in the bag EXCEPT for insulin, which is one of the meds she does not have at home, she did not get this med outpatient in the paSt due to cost, Dr is currently working on getting an insulin filled before her husband arrives at 7pm that is more affordable, patient keeps trying to leave, but husband/ride will not be here until 7pm which I have also explained several times--I have encouraged patient to wait patiently so that she will have all her meds at discharge to avoid being readmitted for the same reason she came this time/non compliance with meds due to cost

## 2021-04-14 NOTE — Progress Notes (Signed)
Spoke with Dr. Venita Sheffield from IMTS. MD states no need to place back IV or put back on heart monitor. Discharge order will remain. RN will give nighttime meds.

## 2021-04-14 NOTE — Progress Notes (Signed)
Patient refused CPAP. No unit in room at this time. 

## 2021-04-14 NOTE — Evaluation (Signed)
Occupational Therapy Evaluation Patient Details Name: [name] MRN: [id number] DOB: October 14, 1955 Today's Date: 04/14/2021   History of Present Illness Pt is a 65 y.o. female admitted 04/12/21 with c/o chest pain, headache, dizziness with ambulation. Workup underway; ACS rulled out. PMH includes CHF, DM, HTN, CAD, multiple STEMIs, severe obesity, diabetic retinopathy, blind R eye, chronic pain.   Clinical Impression   Pt typically uses RW for mobility, and gets assist from her husband for IADL, bathing, dressing. She shares of multiple falls in home with inability to get up. Pt today is min A for transfers and mobility with RW,  she is max A for LB ADL, requires seated position for safety with ADL. Pt will benefit from skilled OT in the acute setting and afterwards at the [facility] level for a falls/safety evaluation in addition to addressing low vision in the home (strategies to combat). She will benefit from education with DME for bathing/dressing to maximize independence and decrease caregiver burden on husband.      Recommendations for follow up therapy are one component of a multi-disciplinary discharge planning process, led by the attending physician.  Recommendations may be updated based on patient status, additional functional criteria and insurance authorization.   Follow Up Recommendations  Home health OT;Supervision/Assistance - 24 hour    Equipment Recommendations  Tub/shower bench    Recommendations for Other Services PT consult     Precautions / Restrictions Precautions Precautions: Fall Restrictions Weight Bearing Restrictions: No      Mobility Bed Mobility Overal bed mobility: Needs Assistance Bed Mobility: Supine to Sit     Supine to sit: Min assist;HOB elevated     General bed mobility comments: MinA for trunk elevation; increased time and effort with use of bed rails - educated on bed rails that slide inbetween mattress and box spring    Transfers Overall  transfer level: Needs assistance Equipment used: Rolling walker (2 wheeled) Transfers: Sit to/from Stand Sit to Stand: Min assist         General transfer comment: Pt reliant on momentum and minA to stabilize RW for standing from EOB; educ on technique and sequencing    Balance Overall balance assessment: Needs assistance   Sitting balance-Leahy Scale: Good       Standing balance-Leahy Scale: Poor Standing balance comment: Reliant on UE support                           ADL either performed or assessed with clinical judgement   ADL Overall ADL's : Needs assistance/impaired Eating/Feeding: Set up   Grooming: Minimal assistance;Brushing hair;Sitting   Upper Body Bathing: Moderate assistance;Sitting   Lower Body Bathing: Maximal assistance   Upper Body Dressing : Minimal assistance Upper Body Dressing Details (indicate cue type and reason): to don gown like robe Lower Body Dressing: Maximal assistance   Toilet Transfer: Minimal assistance;Ambulation;RW Toilet Transfer Details (indicate cue type and reason): rocking momentum used to achieve upright Toileting- Clothing Manipulation and Hygiene: Maximal assistance;Sit to/from stand Toileting - Clothing Manipulation Details (indicate cue type and reason): Pt has very long fingernails, has cut herself before while performing peri care. Husband requested that Tub/ Shower Transfer: Moderate assistance Tub/Shower Transfer Details (indicate cue type and reason): prefers to sponge bathe at sink Functional mobility during ADLs: Minimal assistance;Rolling walker General ADL Comments: hx of falls in the home and bathroom with inability to get up.     Vision Baseline Vision/History: 1 Wears glasses;4 Cataracts;3  Glaucoma Ability to See in Adequate Light: 0 Adequate Patient Visual Report: No change from baseline Vision Assessment?:  (baseline visual deficits - no changes)     Perception     Praxis      Pertinent  Vitals/Pain Pain Assessment: Faces Faces Pain Scale: No hurt Pain Intervention(s): Monitored during session;Repositioned     Hand Dominance Right   Extremity/Trunk Assessment Upper Extremity Assessment Upper Extremity Assessment: Generalized weakness   Lower Extremity Assessment Lower Extremity Assessment: Generalized weakness       Communication Communication Communication: No difficulties   Cognition Arousal/Alertness: Awake/alert Behavior During Therapy: WFL for tasks assessed/performed Overall Cognitive Status: Within Functional Limits for tasks assessed                                 General Comments: Apparent decreased attention/difficulty multitasking; pt answering questions and following simple commands appropriately, verbose with speech requiring intermittent redirection. Suspect pt near baseline cognition   General Comments  Educ re: fall risk reduction at home, DME recommendations (bed rail) also cutting nails    Exercises     Shoulder Instructions      Home Living Family/patient expects to be discharged to:: Private residence Living Arrangements: Spouse/significant other Available Help at Discharge: Family;Available 24 hours/day Type of Home: Apartment Home Access: Stairs to enter [employer] of Steps: 2-3 from parking area to door Entrance Stairs-Rails: None Home Layout: One level     Bathroom Shower/Tub: Teacher, early years/pre: Standard Bathroom Accessibility: No   Home Equipment: Cane - single point;Walker - 2 wheels;Shower seat;Wheelchair - manual   Additional Comments: doors in house too narrow for w/c and walker      Prior Functioning/Environment Level of Independence: Needs assistance  Gait / Transfers Assistance Needed: Husband will help with bed mobility and standing assist when needed. Pt reports typically using walker around home; walker will not fit into bathroom, so pt uses furniture for support. Pt  with falls and unable to get up; reliant on multiple families to stand. Husband drives ADL's / Homemaking Assistance Needed: Spouse assists with lower body dressing; does bird baths at sink   Comments: husband does the driving, pt reports wearing depends at home        OT Problem List: Decreased activity tolerance;Impaired balance (sitting and/or standing);Decreased safety awareness;Obesity      OT Treatment/Interventions: Self-care/ADL training;Therapeutic exercise;DME and/or AE instruction;Therapeutic activities;Patient/family education;Balance training    OT Goals(Current goals can be found in the care plan section) Acute Rehab OT Goals Patient Stated Goal: Stop falling OT Goal Formulation: With patient Time For Goal Achievement: 04/28/21 Potential to Achieve Goals: Good ADL Goals Pt Will Perform Grooming: with modified independence;sitting Pt Will Perform Upper Body Dressing: with set-up;sitting Pt Will Perform Lower Body Dressing: with min guard assist;with caregiver independent in assisting;sit to/from stand Pt Will Transfer to Toilet: with supervision;ambulating Pt Will Perform Toileting - Clothing Manipulation and hygiene: with supervision;sit to/from stand Additional ADL Goal #1: Pt will verbalize 3 strategies to prevent falls during ADL in the home with no cues  OT Frequency: Min 2X/week   Barriers to D/C:            Co-evaluation              AM-PAC OT "6 Clicks" Daily Activity     Outcome Measure Help from another person eating meals?: A Little Help from another person taking care of  personal grooming?: A Little Help from another person toileting, which includes using toliet, bedpan, or urinal?: A Lot Help from another person bathing (including washing, rinsing, drying)?: A Lot Help from another person to put on and taking off regular upper body clothing?: A Little Help from another person to put on and taking off regular lower body clothing?: A Lot 6 Click  Score: 15   End of Session Equipment Utilized During Treatment: Gait belt;Rolling walker Nurse Communication: Mobility status  Activity Tolerance: Patient tolerated treatment well Patient left: in chair;with call bell/phone within reach  OT Visit Diagnosis: Unsteadiness on feet (R26.81);Repeated falls (R29.6);Muscle weakness (generalized) (M62.81)                Time: 4784-1282 OT Time Calculation (min): 33 min Charges:  OT General Charges $OT Visit: 1 Visit OT Evaluation $OT Eval Moderate Complexity: [place] OTR/L Acute Rehabilitation Services Pager: [phone] Office: [place] 04/14/2021, 10:22 AM

## 2021-04-14 NOTE — TOC Transition Note (Addendum)
Transition of Care [facility]) - CM/SW Discharge Note   Patient Details  Name: [name] MRN: [id number] Date of Birth: [birth year]  Transition of Care [facility]) CM/SW Contact:  Zenon Mayo, RN Phone Number: 04/14/2021, 4:29 PM   Clinical Narrative:     NCM spoke with patient, offered choice, she has no preference, NCM made referral to [place] at [facility], she is able to take referral. Soc will begin 24 to 48hrs post dc. She is set up with [facility] for med management and HHPT. NCM spoke with patient , she stated she had no issues with getting her medications she only has issues with remembering to take the medications.  NCM informed her to get a alarm clock to help her remember to take meds.  NCM made Stacie aware that patient needs help in remembering to take her meds she will pass this information along to the [facility]. Patient is being discharged today, NCM received call regarding cost of patient insulin, informed MD to contact the diabetes educator so they can help in changing her meds to a more affordable insulin.  Believe the diabetes educator had left for the day since it was after 5 pm.  NCM notified staff RN if patient does not have her insulin then she should not be going home until the insulin dose can be corrected for her.  Looks like MD is calling the pharmacy to see if they can prescribed a cheaper insulin for patient. Per Staff RN note.  9/14- Patient is still here today,her husband got lost when he was to pick her up yesterday ,he drove around til 2 am.  Patient is worried about husband.  He finally got here this am , he states he has PTSD , it comes and goes.  Patient [facility] to get her medications. But she will pick her insulin up from [facility] where it was called in yesterday and the price is 30.00 which spouse said they can afford.  NCM informed them to continue to go to [facility] clinic to get there refills.  Also will make follow up apt with [facility].  Adding Social worker  to [facility] orders.  STacie with [name] notified.     Final next level of care: Home w Home Health Services Barriers to Discharge: No Barriers Identified   Patient Goals and CMS Choice   CMS Medicare.gov Compare Post Acute Care list provided to:: Patient Choice offered to / list presented to : Patient  Discharge Placement                       Discharge Plan and Services                  DME Agency: NA       HH Arranged: RN, PT [facility] Agency: [place] Date [place]: 04/14/21 Time [place]: 1629 Representative spoke with at [place]: [place]) Interventions     Readmission Risk Interventions No flowsheet data found.

## 2021-04-14 NOTE — Progress Notes (Signed)
Inpatient Diabetes Program Recommendations  AACE/ADA: New Consensus Statement on Inpatient Glycemic Control   Target Ranges:  Prepandial:   less than 140 mg/dL      Peak postprandial:   less than 180 mg/dL (1-2 hours)      Critically ill patients:  140 - 180 mg/dL   Results for STALEY, BUDZINSKI (MRN [id number]) as of 04/14/2021 10:19  Ref. Range 04/13/2021 08:31 04/13/2021 11:53 04/13/2021 16:31 04/13/2021 20:56 04/14/2021 07:21  Glucose-Capillary Latest Ref Range: 70 - 99 mg/dL 282 (H) 266 (H) 229 (H) 227 (H) 241 (H)    Review of Glycemic Control  Diabetes history: DM2 Outpatient Diabetes medications: Humalog 75/25 54 units BID Current orders for Inpatient glycemic control: Novolog 0-15 units TID with meals  Inpatient Diabetes Program Recommendations:    Insulin: Please consider ordering Semglee 20 units Q24H, Novolog 0-5 units QHS, and Novolog 3 units TID with meals for meal coverage if patient eats at least 50% of meals.  Thanks, Barnie Alderman, RN, MSN, CDE Diabetes Coordinator Inpatient Diabetes Program [phone] (Team Pager from 8am to 5pm)

## 2021-04-14 NOTE — TOC Progression Note (Signed)
Transition of Care [facility]) - Progression Note    Patient Details  Name: [name] MRN: [id number] Date of Birth: 10/28/1955  Transition of Care [facility]) CM/SW Contact  Zenon Mayo, RN Phone Number: 04/14/2021, 10:58 AM  Clinical Narrative:    NCM spoke with patient, offered choice, she has no preference, NCM made referral to [place] at [facility], she is able to take referral. Soc will begin 24 to 48hrs post dc. Will need order.        Expected Discharge Plan and Services                                                 Social Determinants of Health (SDOH) Interventions    Readmission Risk Interventions No flowsheet data found.

## 2021-04-14 NOTE — Care Management CC44 (Signed)
Condition Code 44 Documentation Completed  Patient Details  Name: AHMIYA ABEE MRN: [id number] Date of Birth: [birth year]   Condition Code 44 given:  Yes Patient signature on Condition Code 44 notice:  Yes Documentation of 2 MD's agreement:  Yes Code 44 added to claim:  Yes    Zenon Mayo, RN 04/14/2021, 10:27 AM

## 2021-04-14 NOTE — Progress Notes (Signed)
RN spoke with spouse, Tu Shimmel. Spouse states that he is on his way to hospital to pick up the patient. Given hospital address.

## 2021-04-14 NOTE — Progress Notes (Signed)
RN attempted to call husband [name] again for ETA for pick up for discharge. No answer at numbers in chart.

## 2021-04-14 NOTE — Discharge Summary (Addendum)
Name: [name] MRN: [id number] DOB: 1955/12/20 65 y.o. PCP: Ladell Pier, MD  Date of Admission: 04/12/2021  5:23 PM Date of Discharge: 04/14/2021 Attending Physician: Axel Filler, *  Discharge Diagnosis: Atypical chest pain, likely MSK source CAD history of angina pectoris Hyperlipidemia Chronic HFpEF Hypertension Type 2 diabetes mellitus Left ovarian cyst CKD IIIa OSA GERD Glaucoma  Discharge Medications: Allergies as of 04/14/2021       Reactions   Ativan [lorazepam] Anaphylaxis   Orange Fruit [citrus] Anaphylaxis, Other (See Comments)   Pt stated throat swelling, itching        Medication List     STOP taking these medications    acetaminophen 500 MG tablet Commonly known as: TYLENOL   diltiazem 240 MG 24 hr capsule Commonly known as: DILACOR XR Replaced by: Cartia XT 240 MG 24 hr capsule   ferrous sulfate 325 (65 FE) MG tablet   ReliOn Insulin Syringe 31G X 15/64" 1 ML Misc Generic drug: Insulin Syringe-Needle U-100 Replaced by: Safety Insulin Syringes 30G X 1/2" 1 ML Misc       TAKE these medications    Accu-Chek Softclix Lancets lancets Use as instructed for 3 times daily blood glucose monitoring   acetaZOLAMIDE ER 500 MG capsule Commonly known as: DIAMOX Take 1 capsule (500 mg total) by mouth 2 (two) times daily.   Aspirin Low Dose 81 MG EC tablet Generic drug: aspirin Take 1 tablet (81 mg total) by mouth daily.   atorvastatin 80 MG tablet Commonly known as: LIPITOR TAKE 1 TABLET BY MOUTH ONCE DAILY IN THE EVENING AT 6  PM What changed:  how much to take how to take this when to take this additional instructions   B-12 1000 MCG Tabs Take 1,000 mcg by mouth daily.   bismuth subsalicylate 601 UX/32TF suspension Commonly known as: PEPTO BISMOL Take 30 mLs by mouth every 6 (six) hours as needed for indigestion.   Blood Pressure Monitor/Arm Devi 1 each by Does not apply route daily. ICD 10, I10 and I50.32    brimonidine-timolol 0.2-0.5 % ophthalmic solution Commonly known as: COMBIGAN Place 1 drop into both eyes 2 (two) times daily.   Cartia XT 240 MG 24 hr capsule Generic drug: diltiazem Take 1 capsule (240 mg total) by mouth daily. Replaces: diltiazem 240 MG 24 hr capsule   furosemide 40 MG tablet Commonly known as: LASIX Take 1 tablet (40 mg total) by mouth 2 (two) times daily. What changed:  See the new instructions. Another medication with the same name was removed. Continue taking this medication, and follow the directions you see here.   glucose blood test strip Use TID before meals / Dx E11.9   glucose monitoring kit monitoring kit 1 each by Does not apply route 4 (four) times daily - after meals and at bedtime.   Accu-Chek Aviva Plus w/Device Kit Used as directed      HumaLOG Mix 75/25 (75-25) 100 UNIT/ML Susp injection Generic drug: insulin lispro protamine-lispro Inject 54 Units into the skin 2 (two) times daily with a meal. What changed: You were already taking a medication with the same name, and this prescription was added. Make sure you understand how and when to take each.  (SENT TO [facility], HAVE CALLED AND CONFIRMED THAT PRICE FOR 1 MONTH SUPPLY IS $35)     isosorbide mononitrate 60 MG 24 hr tablet Commonly known as: IMDUR Take 2 tablets (120 mg total) by mouth daily. What changed: medication strength  latanoprost 0.005 % ophthalmic solution Commonly known as: XALATAN Place 1 drop into the right eye at bedtime.   losartan 100 MG tablet Commonly known as: COZAAR Take 1 tablet (100 mg total) by mouth daily.   megestrol 40 MG tablet Commonly known as: MEGACE Take 1 tablet (40 mg total) by mouth 2 (two) times daily.   metoprolol 200 MG 24 hr tablet Commonly known as: TOPROL-XL Take 1 tablet (200 mg total) by mouth daily. NEED OV.   Muscle Rub 10-15 % Crea Apply 1 application topically daily as needed (knee pain). What changed: medication  strength   nitroGLYCERIN 0.4 MG SL tablet Commonly known as: NITROSTAT DISSOLVE ONE TABLET UNDER THE TONGUE EVERY 5 MINUTES AS NEEDED FOR CHEST PAIN.  DO NOT EXCEED A TOTAL OF 3 DOSES IN 15 MINUTES What changed:  how much to take how to take this when to take this reasons to take this additional instructions   pantoprazole 40 MG tablet Commonly known as: PROTONIX Take 1 tablet (40 mg total) by mouth daily. What changed:  when to take this reasons to take this   potassium chloride SA 20 MEQ tablet Commonly known as: KLOR-CON Take 2 tablets (40 mEq total) by mouth daily. with food What changed:  how much to take when to take this additional instructions   Safety Insulin Syringes 30G X 1/2" 1 ML Misc Generic drug: Insulin Syringe-Needle U-100 Use as directed with insulin Replaces: ReliOn Insulin Syringe 31G X 15/64" 1 ML Misc        Disposition and follow-up:   Ms.Hazelynn M Arvelo was discharged from [facility] in Stable condition.  At the hospital follow up visit please address:  CAD/Chronic HFpEF/HLD/HTN.  Patient has a history of noncompliance, have discussed with her the importance of her medicines daily adherence.  Please reinforce. She is currently on: Atorvastatin 80 mg Metoprolol succinate 200 mg Losartan 100 mg Diltiazem 240 mg Lasix 40 mg twice daily Klor-Con 40 mEq Type 2 diabetes.  Patient's diabetes is not well controlled.  Currently on Humalog 70/25. HbA1c 8.3 (04/14/2021), capillary glucose ranged from 250s - 307.  Cost is a big concern for patient.  Referral to OB/GYN and sleep medicine. Complex left ovarian cyst.  Patient has a history of hematuria secondary to Left ovarian cyst, that was biopsied in May 2022, pathology reported that it was a benign ovarian mass.  Instructed patient to reestablish with her OB/GYN, and follow-up. OSA.  Referral for repeat sleep study, she has CPAP at home, but does not like the mask, so she does not use  it.  Labs / imaging needed at time of follow-up: CBC, BMP Pending labs/ test needing follow-up: None  Follow-up Appointments:  Follow-up Information     [name].   Specialty: Home Health Services Why: HHPT, [place] information: [place] [id number] [phone]                 Hospital Course by problem list: Brayley Mackowiak is a 65 y/o F living with ischemic heart disease, T2DM, HTN, CKD IIIa, glaucoma, severe obesity, and HFpEF presented to ED for recurrent chest pains, was ruled out for ACS, and was admitted for observations.  During this hospitalization, her home medicines were restarted and she had no other episodes of chest pain. She admitted to a history of noncompliance to her medicines, and we discussed the risk of not taking her medicine.   #Atypical  Chest Pain; ACS rule out #CAD hx of Angina pectoris  #HLD Patient is high risk for ACS with several prior NSTEMIs (02-25-2009; 05-15-2009; 01-08-2010; 04-04-2012). Last cardiac cath 2010, 3 vessel disease of circumflex, RCA, and D1.  In the ED, troponins were negative, EKG showed no acute changes from prior EKG, and echo revealed now ischemic damage.  She denied chest pain and associating symptoms since admission. She did have point tenderness to palpation of her left anterior chest, with pain at the superior breast tissue and chest wall, probably Cooper's ligament strain. We talked about the need for more supportive clothing and weight loss as able. Overall low suspicion this pain was from ischemic heart disease, unfortunately patient does have a low functional state right now and not enough support at home.  - HEART score of 6 - Continue daily aspirin 55m - Continue daily atorvastatin 829m- Continue home isosorbide mononitrate 120 mg daily  - Continue metoprolol 20064maily and losartan 100m42mily - Continue Nitro PRN   #Chronic HFpEF Patient admitted to  selective adherence to medications. Patient presented at admission with the same weight as last discharge of 129.4 kg but after 1 day restarting her home Lasix, her weight dropped to 116.9 kg.  This suggests selective adherence to her medical regiment.  There was adequate diuresis, with urine output of 900 mL and that output of - 780mL40montinue metoprolol succinate 200 daily - Continue diltiazem 240 daily - Continue lasix 40 BID - Continue home potassium 40 daily - Strict I/O - Daily weights - PT/ OT eval and treat   #HTN Patient presented with elevated blood pressure, with systolic of 200 a291diastolic 80s. 91Yter restarted her home medicines, and BP downtrending, with last being 159/78. - Continue losartan 100 mg daily - CTM blood pressure    #OSA Reports a history of being told to use a CPAP multiple times, frequently wakes up at night with headaches. Patient does not use CPAP at this time, but we encouraged CPAP use. - CPAP qHS   #T2DM Patient's diabetes is not well controlled. Currently on Humalog 70/25. HbA1c 8.3 (04/14/2021), capillary glucose ranged from 250s - 307.  Cost is a big concern for patient.  Initially,HUMALOG MIX 75/25 (75-25) 100 UNIT/ML SUSP injection was sent to [facility]. However received page from nurse that insulin was outside of patient's means. Called patient's pharmacy, [facility], and confirmed that HumaLOG Mix 75/25 (75-25) 100 UNIT/ML Susp injection was much more affordable at $35. Patient and RN was informed.  #CKD IIIa Cr 0.81 today. - Continue home losartan 100   #GERD - Continue home pantoprazole 40   #Glaucoma - Continue home latanoprost, brimonidine, timolol  Discharge Exam:   BP (!) 148/82 (BP Location: Right Arm)   Pulse 70   Temp 98 F (36.7 C) (Oral)   Resp 20   Ht _0  (1.6 m)   Wt 116.9 kg   LMP 04/02/2012   SpO2 99%   BMI 45.65 kg/m  Discharge exam:  Physical Exam Vitals and nursing note reviewed.   Constitutional:      General: She is not in acute distress.    Appearance: She is obese.  HENT:     Head: Normocephalic and atraumatic.  Cardiovascular:     Rate and Rhythm: Normal rate and regular rhythm.     Heart sounds: Normal heart sounds. No murmur heard.   No friction rub. No gallop.  Pulmonary:  Effort: Pulmonary effort is normal.     Breath sounds: Normal breath sounds.  Chest:     Chest wall: Tenderness (mildly to  palpation) present.  Musculoskeletal:     Right lower leg: Edema present.     Left lower leg: Edema present.  Skin:    General: Skin is warm and dry.  Neurological:     Mental Status: She is alert and oriented to person, place, and time.  Psychiatric:        Mood and Affect: Mood is anxious.    Pertinent Labs, Studies, and Procedures:  Serial troponins: 11, 9  2.   Hemoglobin A1c 8.3  3.   Lipid panel: LDL 109  4.   ECHO Procedure: 2D Echo, Cardiac Doppler and Color Doppler   Indications:    Chest pain     History:        Patient has prior history of Echocardiogram examinations, most recent 01/24/2020. CHF, CAD; Risk Factors:Hypertension,Dyslipidemia and Sleep Apnea. CKD.    IMPRESSIONS    1. Left ventricular ejection fraction, by estimation, is 65 to 70%. The  left ventricle has normal function. The left ventricle has no regional  wall motion abnormalities. There is moderate left ventricular hypertrophy.  Left ventricular diastolic  parameters are consistent with Grade I diastolic dysfunction (impaired  relaxation). Elevated left ventricular end-diastolic pressure. The E/e' is  60.   2. Right ventricular systolic function is low normal. The right  ventricular size is normal. There is mildly elevated pulmonary artery  systolic pressure. The estimated right ventricular systolic pressure is  36.6 mmHg.   3. The mitral valve is grossly normal. Trivial mitral valve  regurgitation.   4. The aortic valve is tricuspid. Aortic valve  regurgitation is not  visualized.   5. The inferior vena cava is normal in size with <50% respiratory  variability, suggesting right atrial pressure of 8 mmHg.   Comparison(s): Changes from prior study are noted. 01/24/2020: LVEF 65-70%,  grade 2 DD with elevated LA pressure, RVSP 35.2 mmHg.   FINDINGS   Left Ventricle: Left ventricular ejection fraction, by estimation, is 65  to 70%. The left ventricle has normal function. The left ventricle has no  regional wall motion abnormalities. The left ventricular internal cavity  size was normal in size. There is   moderate left ventricular hypertrophy. Left ventricular diastolic  parameters are consistent with Grade I diastolic dysfunction (impaired  relaxation). Elevated left ventricular end-diastolic pressure. The E/e' is  33.   Right Ventricle: The right ventricular size is normal. No increase in  right ventricular wall thickness. Right ventricular systolic function is  low normal. There is mildly elevated pulmonary artery systolic pressure.  The tricuspid regurgitant velocity is  2.87 m/s, and with an assumed right atrial pressure of 8 mmHg, the  estimated right ventricular systolic pressure is 44.0 mmHg.   Left Atrium: Left atrial size was normal in size.   Right Atrium: Right atrial size was normal in size.   Pericardium: There is no evidence of pericardial effusion.   Mitral Valve: The mitral valve is grossly normal. Trivial mitral valve  regurgitation. MV peak gradient, 3.8 mmHg. The mean mitral valve gradient  is 2.0 mmHg.   Tricuspid Valve: The tricuspid valve is grossly normal. Tricuspid valve  regurgitation is trivial.   Aortic Valve: The aortic valve is tricuspid. Aortic valve regurgitation is  not visualized. Aortic valve mean gradient measures 3.0 mmHg. Aortic valve  peak gradient measures 7.0 mmHg. Aortic  valve area, by VTI measures 1.96  cm.   Pulmonic Valve: The pulmonic valve was normal in structure. Pulmonic  valve  regurgitation is not visualized.   Aorta: The aortic root and ascending aorta are structurally normal, with  no evidence of dilitation.   Venous: The inferior vena cava is normal in size with less than 50%  respiratory variability, suggesting right atrial pressure of 8 mmHg.   IAS/Shunts: There is left bowing of the interatrial septum, suggestive of  elevated right atrial pressure. No atrial level shunt detected by color  flow Doppler.      LEFT VENTRICLE  PLAX 2D  LVIDd:         3.30 cm  Diastology  LVIDs:         2.50 cm  LV e' medial:    5.50 cm/s  LV PW:         1.30 cm  LV E/e' medial:  18.0  LV IVS:        1.50 cm  LV e' lateral:   5.62 cm/s  LVOT diam:     1.90 cm  LV E/e' lateral: 17.6  LV SV:         61  LV SV Index:   27  LVOT Area:     2.84 cm      RIGHT VENTRICLE             IVC  RV Basal diam:  2.80 cm     IVC diam: 1.80 cm  RV S prime:     10.90 cm/s  TAPSE (M-mode): 2.1 cm   LEFT ATRIUM             Index       RIGHT ATRIUM           Index  LA diam:        3.00 cm 1.33 cm/m  RA Area:     19.90 cm  LA Vol (A2C):   57.5 ml 25.57 ml/m RA Volume:   56.90 ml  25.30 ml/m  LA Vol (A4C):   45.9 ml 20.41 ml/m  LA Biplane Vol: 50.7 ml 22.55 ml/m   AORTIC VALVE  AV Area (Vmax):    2.11 cm  AV Area (Vmean):   2.15 cm  AV Area (VTI):     1.96 cm  AV Vmax:           132.00 cm/s  AV Vmean:          83.600 cm/s  AV VTI:            0.311 m  AV Peak Grad:      7.0 mmHg  AV Mean Grad:      3.0 mmHg  LVOT Vmax:         98.20 cm/s  LVOT Vmean:        63.500 cm/s  LVOT VTI:          0.215 m  LVOT/AV VTI ratio: 0.69     AORTA  Ao Root diam: 3.20 cm  Ao Asc diam:  3.20 cm   MITRAL VALVE               TRICUSPID VALVE  MV Area (PHT): 2.27 cm    TR Peak grad:   32.9 mmHg  MV Area VTI:   1.46 cm    TR Vmax:        287.00 cm/s  MV Peak grad:  3.8 mmHg  MV Mean grad:  2.0 mmHg    SHUNTS  MV Vmax:       0.98 m/s    Systemic VTI:  0.22 m  MV Vmean:       61.2 cm/s   Systemic Diam: 1.90 cm  MV Decel Time: 334 msec  MV E velocity: 99.00 cm/s  MV A velocity: 95.90 cm/s  MV E/A ratio:  1.03     Discharge Instructions:  Dear Ms. Reliford, I am so glad you are feeling better and can be discharged today! You were admitted because of chest pain because of your coronary artery disease.   Please see the following instructions: Please get all of your medicines together, and in a place where they are easily accessed to you will remember. There has been no changes to her home medicines, please take them as instructed prior.  Please remember that these are lifetime supplements now, please take them every day. Please schedule an appointment with your family doctor. Please schedule appointment with your OB/GYN about your ovary. YOUR INSULIN IS AT [facility]. PLEASE PICK ASAP.  I know that all of this is scary right now, but they are trying to work success.  You got this, just 1 thing at a time.  It was a pleasure meeting you, Ms. Baise. I wish you and your family the best, and hope you stay happy and healthy!  Best, Merrily Brittle, DO Psychiatry intern  Discharge Instructions     (HEART FAILURE PATIENTS) Call MD:  Anytime you have any of the following symptoms: 1) 3 pound weight gain in 24 hours or 5 pounds in 1 week 2) shortness of breath, with or without a dry hacking cough 3) swelling in the hands, feet or stomach 4) if you have to sleep on extra pillows at night in order to breathe.   Complete by: As directed    Activity as tolerated - No restrictions   Complete by: As directed    Call MD for:  difficulty breathing, headache or visual disturbances   Complete by: As directed    Call MD for:  extreme fatigue   Complete by: As directed    Call MD for:  hives   Complete by: As directed    Call MD for:  persistant dizziness or light-headedness   Complete by: As directed    Call MD for:  persistant nausea and vomiting   Complete by:  As directed    Call MD for:  redness, tenderness, or signs of infection (pain, swelling, redness, odor or green/yellow discharge around incision site)   Complete by: As directed    Call MD for:  severe uncontrolled pain   Complete by: As directed    Call MD for:  temperature >100.4   Complete by: As directed    Diet - low sodium heart healthy   Complete by: As directed    Discharge instructions   Complete by: As directed    See discharge instruction.       Signed: Merrily Brittle, DO 04/14/2021, 6:43 PM   Pager: [phone]

## 2021-04-14 NOTE — Progress Notes (Signed)
Attempted to call Abundio Miu on moblie number and home number. Mobile number disconnects when called. Home number rings with no answer.

## 2021-04-14 NOTE — Progress Notes (Signed)
I personally talked with patient's husband at 29 to arrange pick up time today for discharge, which was to be at 1900 at main entrance,letter A,valet parking area--husband never arrived, I waited outside with patient for 20 min, I brought patient back in, reopened her room, and tried to call husband on cell phone and home phone with no answer and no way to leave message, next shift nurse will continue trying to reach husband or contact case mgr in ED to see if transportation voucher can be arranged

## 2021-04-14 NOTE — Progress Notes (Signed)
Attempted to call spouse, Nikki Dom regarding pending discharge. Unable to leave a message. Mobile number rings then disconnects, home number just rings with no answer. Patient states that her husband does not drive at night.

## 2021-04-14 NOTE — Evaluation (Addendum)
Physical Therapy Evaluation Patient Details Name: [name] MRN: [id number] DOB: [birth year] Today's Date: 04/14/2021  History of Present Illness  Pt is a 65 y.o. female admitted 04/12/21 with c/o chest pain, headache, dizziness with ambulation. Workup underway; ACS rulled out. PMH includes CHF, DM, HTN, CAD, multiple STEMIs, severe obesity, diabetic retinopathy, blind R eye, chronic pain.   Clinical Impression  Pt presents with an overall decrease in functional mobility secondary to above. PTA, pt limited ambulator with RW, husband assists with mobility and ADL tasks as needed. Today, pt requiring minA for bed mobility and standing, then able to ambulate with RW and min guard; pt with no c/o chest pain or dizziness this session. Educ re: fall risk reduction, DME recommendations. Pt would benefit from continued acute PT services to maximize functional mobility and independence prior to d/c with HHPT services.       Recommendations for follow up therapy are one component of a multi-disciplinary discharge planning process, led by the attending physician.  Recommendations may be updated based on patient status, additional functional criteria and insurance authorization.  Follow Up Recommendations Home health PT;Supervision for mobility/OOB    Equipment Recommendations  None recommended by PT    Recommendations for Other Services       Precautions / Restrictions Precautions Precautions: Fall Restrictions Weight Bearing Restrictions: No      Mobility  Bed Mobility Overal bed mobility: Needs Assistance Bed Mobility: Supine to Sit     Supine to sit: Min assist;HOB elevated     General bed mobility comments: MinA for trunk elevation; increased time and effort with use of bed rails    Transfers Overall transfer level: Needs assistance Equipment used: Rolling walker (2 wheeled) Transfers: Sit to/from Stand Sit to Stand: Min assist         General transfer comment: Pt  reliant on momentum and minA to stabilize RW for standing from EOB; educ on technique and sequencing  Ambulation/Gait Ambulation/Gait assistance: Min guard Gait Distance (Feet): 24 Feet Assistive device: Rolling walker (2 wheeled) Gait Pattern/deviations: Step-through pattern;Decreased stride length;Wide base of support Gait velocity: Decreased   General Gait Details: Slow, steady gait with RW and min guard for balance; pt reports h/o poor balance, no overt instability noted  Stairs            Wheelchair Mobility    Modified Rankin (Stroke Patients Only)       Balance Overall balance assessment: Needs assistance   Sitting balance-Leahy Scale: Good       Standing balance-Leahy Scale: Poor Standing balance comment: Reliant on UE support                             Pertinent Vitals/Pain Pain Assessment: Faces Faces Pain Scale: No hurt Pain Intervention(s): Monitored during session    Home Living Family/patient expects to be discharged to:: Private residence Living Arrangements: Spouse/significant other Available Help at Discharge: Family;Available 24 hours/day Type of Home: Apartment Home Access: Stairs to enter Entrance Stairs-Rails: None Entrance Stairs-Number of Steps: 2-3 from parking area to door Home Layout: One level Home Equipment: Cane - single point;Walker - 2 wheels;Shower seat;Wheelchair - manual Additional Comments: doors in house too narrow for w/c and walker    Prior Function Level of Independence: Needs assistance   Gait / Transfers Assistance Needed: Husband will help with bed mobility and standing assist when needed. Pt reports typically using walker around home; walker will not fit  into bathroom, so pt uses furniture for support. Pt with falls and unable to get up; reliant on multiple families to stand. Husband drives  ADL's / Homemaking Assistance Needed: Spouse assists with lower body dressing; does bird baths at sink         Hand Dominance        Extremity/Trunk Assessment   Upper Extremity Assessment Upper Extremity Assessment: Generalized weakness    Lower Extremity Assessment Lower Extremity Assessment: Generalized weakness       Communication   Communication: No difficulties  Cognition Arousal/Alertness: Awake/alert Behavior During Therapy: WFL for tasks assessed/performed Overall Cognitive Status: Within Functional Limits for tasks assessed                                 General Comments: Apparent decreased attention/difficulty multitasking; pt answering questions and following simple commands appropriately, verbose with speech requiring intermittent redirection. Suspect pt near baseline cognition      General Comments General comments (skin integrity, edema, etc.): Educ re: fall risk reduction at home, DME recommendations/home modifications    Exercises     Assessment/Plan    PT Assessment Patient needs continued PT services  PT Problem List Decreased strength;Decreased activity tolerance;Decreased balance;Decreased mobility       PT Treatment Interventions DME instruction;Gait training;Stair training;Functional mobility training;Therapeutic activities;Therapeutic exercise;Balance training;Patient/family education    PT Goals (Current goals can be found in the Care Plan section)  Acute Rehab PT Goals Patient Stated Goal: Stop falling PT Goal Formulation: With patient Time For Goal Achievement: 04/28/21 Potential to Achieve Goals: Fair    Frequency Min 3X/week   Barriers to discharge Inaccessible home environment      Co-evaluation               AM-PAC PT "6 Clicks" Mobility  Outcome Measure Help needed turning from your back to your side while in a flat bed without using bedrails?: A Little Help needed moving from lying on your back to sitting on the side of a flat bed without using bedrails?: A Little Help needed moving to and from a bed to a  chair (including a wheelchair)?: A Little Help needed standing up from a chair using your arms (e.g., wheelchair or bedside chair)?: A Little Help needed to walk in hospital room?: A Little Help needed climbing 3-5 steps with a railing? : A Little 6 Click Score: 18    End of Session Equipment Utilized During Treatment: Gait belt Activity Tolerance: Patient tolerated treatment well Patient left: in chair;with call bell/phone within reach Nurse Communication: Mobility status;Other (comment) (pt on chair alarm pad, but no box in room; RN/NT aware and going to look for one) PT Visit Diagnosis: Other abnormalities of gait and mobility (R26.89);Muscle weakness (generalized) (M62.81)    Time: 9379-0240 PT Time Calculation (min) (ACUTE ONLY): 29 min   Charges:   PT Evaluation $PT Eval Moderate Complexity: [place], PT, DPT Acute Rehabilitation Services  Pager [phone] Office [place] 04/14/2021, 9:35 AM

## 2021-04-14 NOTE — Care Management Obs Status (Signed)
[place] NOTIFICATION   Patient Details  Name: [name] MRN: [id number] Date of Birth: 19-Apr-1956   Medicare Observation Status Notification Given:  Yes    Zenon Mayo, RN 04/14/2021, 10:27 AM

## 2021-04-15 ENCOUNTER — Other Ambulatory Visit: Payer: Self-pay

## 2021-04-15 DIAGNOSIS — I10 Essential (primary) hypertension: Secondary | ICD-10-CM

## 2021-04-15 DIAGNOSIS — I5033 Acute on chronic diastolic (congestive) heart failure: Secondary | ICD-10-CM

## 2021-04-15 DIAGNOSIS — Z794 Long term (current) use of insulin: Secondary | ICD-10-CM

## 2021-04-15 LAB — GLUCOSE, CAPILLARY: Glucose-Capillary: 237 mg/dL — ABNORMAL HIGH (ref 70–99)

## 2021-04-15 NOTE — Progress Notes (Signed)
Physical Therapy Treatment Patient Details Name: [name] MRN: [id number] DOB: 02-15-1956 Today's Date: 04/15/2021   History of Present Illness Pt is a 65 y.o. female admitted 04/12/21 with c/o chest pain, headache, dizziness with ambulation. Workup underway; ACS rulled out. PMH includes CHF, DM, HTN, CAD, multiple STEMIs, severe obesity, diabetic retinopathy, blind R eye, chronic pain.   PT Comments    Pt progressing with mobility. Today's session focused on continued transfer and gait training with RW, pt requiring intermittent minA to stand. Pt remains limited by generalized weakness, decreased activity tolerance, and impaired balance strategies/postural reactions. Will continue to follow to address established goals.    Recommendations for follow up therapy are one component of a multi-disciplinary discharge planning process, led by the attending physician.  Recommendations may be updated based on patient status, additional functional criteria and insurance authorization.  Follow Up Recommendations  Home health PT;Supervision for mobility/OOB     Equipment Recommendations  None recommended by PT    Recommendations for Other Services       Precautions / Restrictions Precautions Precautions: Fall;Other (comment) Precaution Comments: urinary incontinence Restrictions Weight Bearing Restrictions: No     Mobility  Bed Mobility Overal bed mobility: Modified Independent Bed Mobility: Supine to Sit     Supine to sit: Modified independent (Device/Increase time);HOB elevated     General bed mobility comments: use of bed rail, increased time and effort; mod indep    Transfers Overall transfer level: Needs assistance Equipment used: Rolling walker (2 wheeled) Transfers: Sit to/from Stand Sit to Stand: Min assist;Min guard         General transfer comment: Pt requiring minA for 2x standing from EOB to RW, reliant on momentum and assist to stabilize; additional stand  from recliner with min guard and use of armrests  Ambulation/Gait Ambulation/Gait assistance: Min guard Gait Distance (Feet): 64 Feet Assistive device: Rolling walker (2 wheeled) Gait Pattern/deviations: Step-through pattern;Decreased stride length;Wide base of support Gait velocity: Decreased   General Gait Details: Slow, fatigued gait with RW and intermittent min guard for balance; verbal cues for activity pacing and maintaining closer proximity to [employer] Mobility    Modified Rankin (Stroke Patients Only)       Balance Overall balance assessment: Needs assistance           Standing balance-Leahy Scale: Poor Standing balance comment: Reliant on UE support, assist for posterior pericare                            Cognition Arousal/Alertness: Awake/alert Behavior During Therapy: WFL for tasks assessed/performed;Anxious Overall Cognitive Status: No family/caregiver present to determine baseline cognitive functioning                                 General Comments: pt emotional regarding husband not being able to pick her up yesterda. Apparent decreased attention/difficulty multitasking; pt answering questions and following simple commands appropriately, verbose with speech requiring intermittent redirection. Suspect pt near baseline cognition      Exercises      General Comments        Pertinent Vitals/Pain Pain Assessment: No/denies pain Pain Intervention(s): Monitored during session    Home Living  Prior Function            PT Goals (current goals can now be found in the care plan section) Progress towards PT goals: Progressing toward goals    Frequency    Min 3X/week      PT Plan Current plan remains appropriate    Co-evaluation              AM-PAC PT "6 Clicks" Mobility   Outcome Measure  Help needed turning from your back to your side while in a  flat bed without using bedrails?: None Help needed moving from lying on your back to sitting on the side of a flat bed without using bedrails?: A Little Help needed moving to and from a bed to a chair (including a wheelchair)?: A Little Help needed standing up from a chair using your arms (e.g., wheelchair or bedside chair)?: A Little Help needed to walk in hospital room?: A Little Help needed climbing 3-5 steps with a railing? : A Little 6 Click Score: 19    End of Session Equipment Utilized During Treatment: Gait belt Activity Tolerance: Patient tolerated treatment well;Patient limited by fatigue Patient left: in chair;with call bell/phone within reach Nurse Communication: Mobility status PT Visit Diagnosis: Other abnormalities of gait and mobility (R26.89);Muscle weakness (generalized) (M62.81)     Time: 5396-7289 PT Time Calculation (min) (ACUTE ONLY): 22 min  Charges:  $Therapeutic Activity: 8-22 mins                     Mabeline Caras, PT, DPT Acute Rehabilitation Services  Pager [phone] Office [phone]  [name] 04/15/2021, 9:57 AM

## 2021-04-15 NOTE — Progress Notes (Signed)
Patient's spouse called. States that he was lost for a few hours trying to locate the hospital to pick up patient for discharge. TOC consult placed for transportation arrangements to home.

## 2021-04-16 ENCOUNTER — Telehealth: Payer: Self-pay

## 2021-04-16 ENCOUNTER — Other Ambulatory Visit: Payer: Self-pay | Admitting: Pharmacist

## 2021-04-16 NOTE — Telephone Encounter (Signed)
Transition Care Management Unsuccessful Follow-up Telephone Call  Date of discharge and from where:  04/15/2021, [facility]   Attempts:  1st Attempt  Reason for unsuccessful TCM follow-up call:  Unable to leave message on # [phone] or # [phone]. Both phones ring with no options to leave a voicemail message.  Call placed to other # [phone], her sister in law answered and was not sure why her number is listed and instructed this CM to call # [phone].  Patient has appointment tomorrow - 04/17/2021 with Dr Wynetta Emery @ [place].

## 2021-04-17 ENCOUNTER — Telehealth: Payer: Self-pay

## 2021-04-17 ENCOUNTER — Inpatient Hospital Stay: Payer: Medicare Other | Admitting: Internal Medicine

## 2021-04-17 NOTE — Telephone Encounter (Signed)
Transition Care Management Unsuccessful Follow-up Telephone Call   Date of discharge and from where:  04/15/2021, [facility]    Attempts:  2 nd Attempt   Reason for unsuccessful TCM follow-up call:  Unable to leave message on # [phone] or # [phone]. Both phones ring with no options to leave a voicemail message. Pt need to schedule HFU appt with PCP.

## 2021-04-19 DIAGNOSIS — Z135 Encounter for screening for eye and ear disorders: Secondary | ICD-10-CM | POA: Diagnosis not present

## 2021-04-20 ENCOUNTER — Telehealth: Payer: Self-pay

## 2021-04-20 ENCOUNTER — Other Ambulatory Visit: Payer: Self-pay | Admitting: *Deleted

## 2021-04-20 NOTE — Telephone Encounter (Signed)
Transition Care Management Unsuccessful Follow-up Telephone Call     Date of discharge and from where:  04/15/2021, [facility]    Attempts:  3rd Attempt   Reason for unsuccessful TCM follow-up call:  Unable to leave message on # [phone] or # [phone]. Both phones ring with no options to leave a voicemail message. Pt need to schedule HFU appt with PCP

## 2021-04-20 NOTE — Patient Outreach (Signed)
[facility]) Care Management  04/20/2021  [name] 1956-01-16 [id number]   [facility] unsuccessful outreach to post  hospital referred patient  [name] was referred to [facility] on 04/15/21 for post hospital services. Assigned to RN Care Coordinator for complex care and disease management follow up calls and assess for further needs  Insurance [place] healthcare medicare   No answer at [phone] and RN CM not able to leave a voice message    Plan: [facility] RN CM scheduled this patient for another call attempt within 4-7 business days Unsuccessful outreach letter sent on 04/20/21 Unsuccessful outreach on 04/20/21   [place]. Lavina Hamman, RN, BSN, [place] Coordinator Office number [phone] Mobile number [phone]  Main [facility] number [phone] Fax number [phone]

## 2021-04-21 ENCOUNTER — Telehealth: Payer: Self-pay

## 2021-04-21 NOTE — Telephone Encounter (Signed)
Letter sent to patient instructing her to call [facility] to schedule a hospital follow up appointment.  We have not been able to reach her.

## 2021-04-23 ENCOUNTER — Encounter: Payer: Self-pay | Admitting: *Deleted

## 2021-04-23 ENCOUNTER — Other Ambulatory Visit: Payer: Self-pay

## 2021-04-23 ENCOUNTER — Other Ambulatory Visit: Payer: Self-pay | Admitting: *Deleted

## 2021-04-23 NOTE — Patient Outreach (Signed)
[facility]) Care Management  04/23/2021  [name] 1955-09-24 [id number]   [facility] outreach to post  Burns referred patient   [name] was referred to [facility] on 04/15/21 for post Burns services. Assigned to RN Care Coordinator for complex care and disease management follow up calls and assess for further needs   Insurance [employer] medicare   Outreach to [phone] without answer Outreach to [phone] [name] answered  [name] is able to verify HIPAA identifiers but with caution She and [name] confirms the best outreach number is [phone]  Updated in EPIC   Assessment [name] reports she is during fair since her discharge home  She reports she believes she was hospitalized for "heart attacks" RN reviewed with her the discharge instruction, congestive Heart Failure (CHF), CHF action plan and symptoms reportable to Elizabeth Pier, MD and cardiologist  She confirms she has her discharge sheets to review  She reports not having a Burns follow up appointment with pcp She reports she believed it was a virtual visit scheduled for today at 1030  This is not noted in EPIC There is listed a no show for 04/17/21   RN CM intervention  Assisted patient with a call to pcp to reschedule Burns follow office visit Spoke with [name] at [facility]) [phone] to attempt to get clarity on a Burns follow appointment that [name]  Monday May 18 1429 pm is the rescheduled date She wrote this down   Pt noted with memory concern unable to recall her home number, confused    Hearing barrier confirmed had hearing check year worked Copy my chart access   [place] reports no outreach from [place] well [phone] at this time Discussed it may be related to incorrect outreach number  My chart Does not prefer access to my chart Does not like to use computers/most  technology  RN CM intervention  Outreach to [facility] [phone] spoke with [name] to check on start date of patient services ordered from her recent hospitalization Provided staff with the best number [phone] to reach patient  [facility] information indicates attempt outreach (no voice mail, with no start date Transferred to [name] to review pt case information & get assist with renewing pt order & scheduling patient [name] will outreach on Tuesday between 5-7 pm to give a definitive time for the arrival on Wednesday, 04/29/21 [name] was conference in to agree to this and also agreeing to answering her phone for a noted call from [name]  Patient Active Problem List   Diagnosis Date Noted   Nausea & vomiting 12/01/2020   Ovarian mass, left 12/01/2020   Hypokalemia 12/01/2020   Leukocytosis 12/01/2020   Generalized weakness 11/30/2020   Acute respiratory failure with hypoxia ([place]) 01/24/2020   Chest pain 01/23/2020   Acute exacerbation of CHF (congestive heart failure) ([place]) 01/23/2020   Functional urinary incontinence 09/07/2019   Urge incontinence of urine 09/07/2019   Legally blind in right eye, as defined in [place] 08/11/2018   Bilateral carpal tunnel syndrome 04/13/2018   Adenomatous polyp of colon 04/13/2018   Fibrocystic breast changes, right 12/12/2017   Drug-induced constipation 09/15/2017   Primary osteoarthritis of both knees 04/05/2017   Chronic bilateral low back pain 03/07/2017   Chronic pain of right knee 03/07/2017   Vitamin B12 deficiency 12/09/2016  Incidental lung nodule, > 16m and < 863m02/13/2018   Vitreous hemorrhage of right eye (HC[facility] [place]   Diabetic gastroparesis associated with type 2 diabetes mellitus (HCCamak02/05/2016   Controlled type 2 diabetes mellitus with complication, with long-term current use of insulin (HCHome02/05/2016   Chronic renal insufficiency, stage III (moderate) (HCC) 03/19/2015   Prolonged Q-T interval on ECG  08/10/2013   Dyslipidemia 04/02/2013   Acute on chronic diastolic heart failure (HCEagle09/01/2012   Presumed OSA (obstructive sleep apnea) 04/06/2012   NSTEMI  post-op 04/04/12-medical Rx 04/04/2012   Chronic diastolic heart failure (HCCedar [place]09/2013   Severe obesity (BMI >= 40) (HCMercersville06/17/2013   GERD 08/21/2009   Complex endometrial hyperplasia with atypia 03/20/2009   PERIPHERAL EDEMA 03/20/2009   Coronary atherosclerosis-not a surgical or PCI candidate 2010 02/27/2009   Proliferative diabetic retinopathy (HCPine Village03/01/2008   DETACHED RETINA, BILATERAL, HX OF 09/07/2007   History of chronic Iron defeicency anemia with acute post op anemia, transfused after debridment 04/04/12 04/17/2007   Essential hypertension 04/17/2007   Past Medical History:  Diagnosis Date   Blind right eye    Chronic back pain    "my whole back" (03/18/2015)   Chronic chest pain    takes isosorbide   Chronic diastolic heart failure (HCPlandome9/13   echo 03/20/15 LV Ef of 60-65%, grade 2DD and  PA peak pressure: 36 mm Hg    CKD (chronic kidney disease), stage III (HCC)    Coronary artery disease    Dr CrSallyanne Kustercardiac cath 02-07-2009 -- diffuse 3V CAD involving RCA, CFx, and D1,  pt not a candidate for bypass surgery or angioplasty,  medical management   Diabetic neuropathy (HCSouth [place]   Diabetic retinopathy (HCPojoaque   bilateral proliferative   Diastolic CHF, chronic (HCLittlefield   Dyspnea    "always"   Edema of both lower extremities    Generalized weakness    GERD (gastroesophageal reflux disease)    Glaucoma, both eyes    History of non-ST elevation myocardial infarction (NSTEMI)    02-25-2009;  05-15-2009;  01-08-2010;  04-04-2012 this MI was post-op surgery for abscess on 04-03-2012   History of sepsis    Hyperlipemia    Hypertension    Insulin dependent type 2 diabetes mellitus (HCPalm [place]   followed by pcp   Iron deficiency anemia due to chronic blood Burns    uterine bleeding   Legally blind in left eye, as defined in [place]   per pt states vision "is foggy"   Mild obstructive sleep apnea    study in epic 04-19-2012 mild osa,  recommendation's were wt. Burns, dental  appliance, surgery, or cpap   Morbid obesity with BMI of 40.0-44.9, adult (HCFerriday   OA (osteoarthritis)    knees   PSVT (paroxysmal supraventricular tachycardia) (HCWilroads Gardens   Renal insufficiency    pt. denies   Seasonal allergies    Wears glasses    Current Outpatient Medications on File Prior to Visit  Medication Sig Dispense Refill   Accu-Chek Softclix Lancets lancets Use as instructed for 3 times daily blood glucose monitoring 100 each 5   acetaZOLAMIDE ER (DIAMOX) 500 MG capsule Take 1 capsule (500 mg total) by mouth 2 (two) times daily. 60 capsule 3   aspirin 81 MG EC tablet Take 1 tablet (81 mg total) by mouth daily. 30 tablet 11   atorvastatin (LIPITOR) 80 MG tablet TAKE 1 TABLET BY MOUTH ONCE DAILY IN THE  EVENING AT 6  PM 90 tablet 3   bismuth subsalicylate (PEPTO BISMOL) 262 MG/15ML suspension Take 30 mLs by mouth every 6 (six) hours as needed for indigestion.     Blood Glucose Monitoring Suppl (ACCU-CHEK AVIVA PLUS) w/Device KIT Used as directed 1 kit 0   Blood Pressure Monitoring (BLOOD PRESSURE MONITOR/ARM) DEVI 1 each by Does not apply route daily. ICD 10, I10 and I50.32 1 Device 0   brimonidine-timolol (COMBIGAN) 0.2-0.5 % ophthalmic solution Place 1 drop into both eyes 2 (two) times daily. 15 mL 0   Cyanocobalamin (VITAMIN B-12) 1000 MCG TABS Take 1,000 mcg by mouth daily. (Patient not taking: Reported on 04/12/2021) 90 tablet 1   diltiazem (CARDIZEM CD) 240 MG 24 hr capsule Take 1 capsule (240 mg total) by mouth daily. 90 capsule 0   furosemide (LASIX) 40 MG tablet Take 1 tablet (40 mg total) by mouth 2 (two) times daily. 180 tablet 3   glucose blood test strip Use TID before meals / Dx E11.9 100 each 12   glucose monitoring kit (FREESTYLE) monitoring kit 1 each by Does not apply route 4 (four) times daily - after meals and at bedtime. 1  each 1   insulin aspart protamine- aspart (NOVOLOG MIX 70/30) (70-30) 100 UNIT/ML injection Inject 0.28 mLs (28 Units total) into the skin 2 (two) times daily with a meal. 16.8 mL 0   insulin lispro protamine-lispro (HUMALOG MIX 75/25) (75-25) 100 UNIT/ML SUSP injection Inject 54 Units into the skin 2 (two) times daily with a meal. 10 mL 3   insulin lispro protamine-lispro (HUMALOG MIX 75/25) (75-25) 100 UNIT/ML SUSP injection Inject 54 Units into the skin 2 (two) times daily with a meal. 30 mL 3   Insulin Syringe-Needle U-100 (SAFETY INSULIN SYRINGES) 30G X 1/2" 1 ML MISC Use as directed with insulin 100 each 6   isosorbide mononitrate (IMDUR) 60 MG 24 hr tablet Take 2 tablets (120 mg total) by mouth daily. 180 tablet 2   latanoprost (XALATAN) 0.005 % ophthalmic solution Place 1 drop into the right eye at bedtime. 2.5 mL 12   losartan (COZAAR) 100 MG tablet Take 1 tablet (100 mg total) by mouth daily. 30 tablet 0   megestrol (MEGACE) 40 MG tablet Take 1 tablet (40 mg total) by mouth 2 (two) times daily. 60 tablet 0   Menthol-Methyl Salicylate (MUSCLE RUB) 10-15 % CREA Apply 1 application topically daily as needed (knee pain). 35 g 0   metoprolol (TOPROL-XL) 200 MG 24 hr tablet Take 1 tablet (200 mg total) by mouth daily. NEED OV. 90 tablet 0   nitroGLYCERIN (NITROSTAT) 0.4 MG SL tablet DISSOLVE ONE TABLET UNDER THE TONGUE EVERY 5 MINUTES AS NEEDED FOR CHEST PAIN.  DO NOT EXCEED A TOTAL OF 3 DOSES IN 15 MINUTES 25 tablet 0   pantoprazole (PROTONIX) 40 MG tablet Take 1 tablet (40 mg total) by mouth daily. 30 tablet 2   potassium chloride SA (KLOR-CON) 20 MEQ tablet Take 2 tablets (40 mEq total) by mouth daily. with food 270 tablet 3   [DISCONTINUED] diltiazem (DILACOR XR) 240 MG 24 hr capsule Take 1 capsule (240 mg total) by mouth daily. 90 capsule 0   No current facility-administered medications on file prior to visit.    Plans Patient agrees to care plan and follow up within the next 30 business  days Pt encouraged to return a call to [facility] RN CM prn      L. Lavina Hamman, RN, BSN, CCM [facility]  Telephonic Care Management Care Coordinator Office number [phone] Main [place] [employer] [place] number [phone] Fax number [phone]

## 2021-04-27 ENCOUNTER — Other Ambulatory Visit: Payer: Self-pay | Admitting: Internal Medicine

## 2021-04-28 NOTE — Telephone Encounter (Signed)
Requested medication (s) are due for refill today:   Prescribed by another provider, not sure  Requested medication (s) are on the active medication list:   Yes  Future visit scheduled:   No   Last ordered: 04/14/2021 #100 each, 6 refills  Returned because there were ordered by a historical provider Dr. Marianna Payment however pharmacy has sent another refill request.   Requested Prescriptions  Pending Prescriptions Disp Refills   [place] X 15/64" 1 ML [place] [Pharmacy Med Name: RELION INS SYR 1ML/31G/6MM MIS] 100 each 0    Sig: USE AS DIRECTED TWICE DAILY FOR  INSULIN     There is no refill protocol information for this order

## 2021-04-30 ENCOUNTER — Telehealth: Payer: Self-pay | Admitting: Internal Medicine
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
## 2021-04-30 NOTE — Telephone Encounter (Signed)
[name] would you be able to follow up

## 2021-05-03 ENCOUNTER — Encounter: Payer: Self-pay | Admitting: Gastroenterology

## 2021-05-05 NOTE — Telephone Encounter (Signed)
Called left VM with Md instructions.

## 2021-05-11 ENCOUNTER — Other Ambulatory Visit: Payer: Self-pay | Admitting: Internal Medicine

## 2021-05-11 NOTE — Telephone Encounter (Signed)
Medication Refill - Medication: [facility] novolog 7030 flex pen and ultrafind pen neededl 31 gauge  Has the patient contacted their pharmacy? yes (Agent: If no, request that the patient contact the pharmacy for the refill.) (Agent: If yes, when and what did the pharmacy advise?)contact pcp  Preferred Pharmacy (with phone number or street name):  [facility], [place] Phone:  [phone]  Fax:  [phone]     Has the patient been seen for an appointment in the last year OR does the patient have an upcoming appointment? yes  Agent: Please be advised that RX refills may take up to 3 business days. We ask that you follow-up with your pharmacy.

## 2021-05-11 NOTE — Telephone Encounter (Signed)
Patient called and asked about the refill requested. She says she is needing the 70/30 insulin vial because her insurance will not pay for the 75/25 humalog mix. I advised I will send this to Dr. Wynetta Emery for refill.

## 2021-05-11 NOTE — Telephone Encounter (Signed)
Requested medication (s) are due for refill today: Yes  Requested medication (s) are on the active medication list: Yes  Last refill:  04/23/21  Future visit scheduled: Yes  Notes to clinic:  Unable to refill per protocol, last refill by another provider. Patient says insurance will only pay for the 70/30 insulin vials. Send to mail order pharmacy, not local pharmacy.      Requested Prescriptions  Pending Prescriptions Disp Refills   Insulin Syringe-Needle U-100 (SAFETY INSULIN SYRINGES) 30G X 1/2" 1 ML MISC 100 each 6    Sig: Use as directed with insulin     There is no refill protocol information for this order     insulin aspart protamine- aspart (NOVOLOG MIX 70/30) (70-30) 100 UNIT/ML injection 16.8 mL 0    Sig: Inject 0.28 mLs (28 Units total) into the skin 2 (two) times daily with a meal.     There is no refill protocol information for this order

## 2021-05-15 ENCOUNTER — Other Ambulatory Visit: Payer: Self-pay

## 2021-05-15 ENCOUNTER — Other Ambulatory Visit: Payer: Self-pay | Admitting: *Deleted
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
## 2021-05-18 ENCOUNTER — Ambulatory Visit: Payer: Medicare HMO | Attending: Internal Medicine | Admitting: Internal Medicine

## 2021-05-18 ENCOUNTER — Other Ambulatory Visit: Payer: Self-pay

## 2021-05-18 ENCOUNTER — Inpatient Hospital Stay: Payer: Medicare Other | Admitting: Internal Medicine

## 2021-05-18 DIAGNOSIS — Z794 Long term (current) use of insulin: Secondary | ICD-10-CM | POA: Diagnosis not present

## 2021-05-18 DIAGNOSIS — R3981 Functional urinary incontinence: Secondary | ICD-10-CM

## 2021-05-18 DIAGNOSIS — I10 Essential (primary) hypertension: Secondary | ICD-10-CM

## 2021-05-18 DIAGNOSIS — N1330 Unspecified hydronephrosis: Secondary | ICD-10-CM

## 2021-05-18 DIAGNOSIS — I25118 Atherosclerotic heart disease of native coronary artery with other forms of angina pectoris: Secondary | ICD-10-CM

## 2021-05-18 DIAGNOSIS — Z09 Encounter for follow-up examination after completed treatment for conditions other than malignant neoplasm: Secondary | ICD-10-CM | POA: Diagnosis not present

## 2021-05-18 DIAGNOSIS — E1159 Type 2 diabetes mellitus with other circulatory complications: Secondary | ICD-10-CM | POA: Diagnosis not present

## 2021-05-18 DIAGNOSIS — N838 Other noninflammatory disorders of ovary, fallopian tube and broad ligament: Secondary | ICD-10-CM

## 2021-05-18 DIAGNOSIS — I5032 Chronic diastolic (congestive) heart failure: Secondary | ICD-10-CM | POA: Diagnosis not present

## 2021-05-18 MED ORDER — ISOSORBIDE MONONITRATE ER 60 MG PO TB24: 120.0000 mg | ORAL_TABLET | Freq: Every day | ORAL | 2 refills | Status: DC

## 2021-05-18 MED ORDER — METOPROLOL SUCCINATE ER 200 MG PO TB24: 200.0000 mg | ORAL_TABLET | Freq: Every day | ORAL | 1 refills | Status: DC

## 2021-05-18 MED ORDER — ATORVASTATIN CALCIUM 80 MG PO TABS: ORAL_TABLET | ORAL | 3 refills | Status: DC

## 2021-05-18 MED ORDER — DILTIAZEM HCL ER COATED BEADS 240 MG PO CP24: 240.0000 mg | ORAL_CAPSULE | Freq: Every day | ORAL | 1 refills | Status: DC

## 2021-05-18 MED ORDER — LOSARTAN POTASSIUM 100 MG PO TABS: 100.0000 mg | ORAL_TABLET | Freq: Every day | ORAL | 1 refills | Status: DC

## 2021-05-18 MED ORDER — HUMALOG MIX 75/25 (75-25) 100 UNIT/ML ~~LOC~~ SUSP: 54.0000 [IU] | Freq: Two times a day (BID) | SUBCUTANEOUS | 3 refills | Status: DC

## 2021-05-18 NOTE — Progress Notes (Signed)
Patient ID: [name], female   DOB: 1955-11-02, 65 y.o.   MRN: [id number] Virtual Visit via Telephone Note  I connected with [name] on 05/18/2021 at 4:24 PM by telephone and verified that I am speaking with the correct person using two identifiers  Location: Patient: home Provider: office  Participants: Myself Patient CMA: Ms. [name]  I discussed the limitations, risks, security and privacy concerns of performing an evaluation and management service by telephone and the availability of in person appointments. I also discussed with the patient that there may be a patient responsible charge related to this service. The patient expressed understanding and agreed to proceed.   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  Patient was last evaluated by me 01/2020.  Purpose of today's visit is hospital follow-up.  Patient hospitalized 11/2020 with generalized weakness, lower abdominal pain with nausea and vomiting.  CT of the abdomen and pelvis showed complex cystic left ovarian mass.  Pelvic ultrasound confirmed the same.  CA125 was elevated at 234.  Endometrial biopsy reveal fragments of glandular epithelium and benign squamous cells.  Patient was deemed not to be a good surgical candidate.  Plan was for her to follow-up with GYN oncology Dr. Harrington Burns.  She was also noted to have mild left hydroureteronephrosis on CT. she was seen by urology and it was felt that there was no need for any acute intervention at that time.  She was to follow-up with them as an outpatient. -Patient did not follow-up with urology or the gynecologist since then.  Hospitalized again on 04/12/2021 for 3 days for recurrent chest pains.  She ruled out for ACS.  Patient admitted to noncompliance with her medications prior to  hospitalization.  She was advised to continue on aspirin, atorvastatin, isosorbide, metoprolol and Cozaar.  She was diuresed and then continued on diltiazem, furosemide and potassium supplement.  Echo revealed EF of 65 to 70% with no  WMA, moderate LVH and grade 1 diastolic dysfunction.  Also found to have mild pulmonary artery pressure elevation Mention is made of the history of OSA and that patient had told them that she has a CPAP machine at home but is not using it. -A1c was 8.3.  She reported cost of medications affecting compliance.  She was discharged home on the Humalog 75/25 mix which she was able to get affordably at [facility] for $35.  Today: This was supposed to be an in person visit but patient states she has transportation issues and the parking lot at her apartment building is being paved.  HTN/CHF/CAD: She tells me that she had been out of all of her medications for about a month due to lack of transportation to get to the pharmacy.  Their car was reprocessed shortly after she left the hospital.  She was unable to get to the pharmacy to pick up her medicines.  Her husband was finally able to get to the pharmacy about 3 days ago and got all of her medicines.  No device to check blood pressure.  Endorses shortness of breath when walking but no worse than before.  Endorses chronic lower extremity edema which she states is not worse since hospital discharge.  Denies any chest pains, PND orthopnea.  Reports some dizziness when she gets up too quickly.  She has  a scale but does not check her weight stating that she hates getting on it. -Just restarted on all of her medicines 3 days ago.  However she tells me that she takes furosemide only 3 times a week because it makes her urinate too much and she has to wear depends.  DM: Not checking blood sugars but does have device.  She was out of insulin for about 1 month until 3 days ago.  Currently on Humalog 75/25 54 units twice a day.  In regards to  the issue of OSA: I see on her chart that she has presumed OSA.  Patient tells me she thinks she had a sleep study done 1 time when she was in a nursing home.  She does not have a CPAP machine.  Denies snoring.  Reports feeling rested when she wakes up in the mornings.  Endorses occasional morning headaches.  Still gets vaginal bleeding/spotting intermittently but not here recently.  She did not follow-up with GYN oncology Dr. Denman Burns since hospitalization in May.  HM: Due for flu shot.  Overdue for eye exam with Dr. Venetia Burns.  However she states that she owes him money and cannot go back at this time.  She prefers not to see someone else in the meantime. Outpatient Encounter Medications as of 05/18/2021  Medication Sig   Accu-Chek Softclix Lancets lancets Use as instructed for 3 times daily blood glucose monitoring   acetaZOLAMIDE ER (DIAMOX) 500 MG capsule Take 1 capsule (500 mg total) by mouth 2 (two) times daily.   aspirin 81 MG EC tablet Take 1 tablet (81 mg total) by mouth daily.   atorvastatin (LIPITOR) 80 MG tablet TAKE 1 TABLET BY MOUTH ONCE DAILY IN THE EVENING AT 6  PM   bismuth subsalicylate (PEPTO BISMOL) 262 MG/15ML suspension Take 30 mLs by mouth every 6 (six) hours as needed for indigestion.   Blood Glucose Monitoring Suppl (ACCU-CHEK AVIVA PLUS) w/Device KIT Used as directed   Blood Pressure Monitoring (BLOOD PRESSURE MONITOR/ARM) DEVI 1 each by Does not apply route daily. ICD 10, I10 and I50.32   brimonidine-timolol (COMBIGAN) 0.2-0.5 % ophthalmic solution Place 1 drop into both eyes 2 (two) times daily.   Cyanocobalamin (VITAMIN B-12) 1000 MCG TABS Take 1,000 mcg by mouth daily. (Patient not taking: Reported on 04/12/2021)   diltiazem (CARDIZEM CD) 240 MG 24 hr capsule Take 1 capsule (240 mg total) by mouth daily.   furosemide (LASIX) 40 MG tablet Take 1 tablet (40 mg total) by mouth 2 (two) times daily.   glucose blood test strip Use TID before meals / Dx E11.9   glucose  monitoring kit (FREESTYLE) monitoring kit 1 each by Does not apply route 4 (four) times daily - after meals and at bedtime.   insulin aspart protamine- aspart (NOVOLOG MIX 70/30) (70-30) 100 UNIT/ML injection Inject 0.28 mLs (28 Units total) into the skin 2 (two) times daily with a meal.   insulin lispro protamine-lispro (HUMALOG MIX 75/25) (75-25) 100 UNIT/ML SUSP injection Inject 54 Units into the skin 2 (two) times daily with a meal.   insulin lispro protamine-lispro (HUMALOG MIX 75/25) (75-25) 100 UNIT/ML SUSP injection Inject 54 Units into the skin 2 (two) times daily with a meal.   Insulin Syringe-Needle U-100 (SAFETY INSULIN SYRINGES) 30G X 1/2" 1 ML MISC Use as directed with insulin   isosorbide mononitrate (IMDUR) 60 MG 24 hr tablet Take 2 tablets (120 mg total) by mouth daily.   latanoprost (XALATAN) 0.005 % ophthalmic solution  Place 1 drop into the right eye at bedtime.   losartan (COZAAR) 100 MG tablet Take 1 tablet (100 mg total) by mouth daily.   megestrol (MEGACE) 40 MG tablet Take 1 tablet (40 mg total) by mouth 2 (two) times daily.   Menthol-Methyl Salicylate (MUSCLE RUB) 10-15 % CREA Apply 1 application topically daily as needed (knee pain).   metoprolol (TOPROL-XL) 200 MG 24 hr tablet Take 1 tablet (200 mg total) by mouth daily. NEED OV.   nitroGLYCERIN (NITROSTAT) 0.4 MG SL tablet DISSOLVE ONE TABLET UNDER THE TONGUE EVERY 5 MINUTES AS NEEDED FOR CHEST PAIN.  DO NOT EXCEED A TOTAL OF 3 DOSES IN 15 MINUTES   pantoprazole (PROTONIX) 40 MG tablet Take 1 tablet (40 mg total) by mouth daily.   potassium chloride SA (KLOR-CON) 20 MEQ tablet Take 2 tablets (40 mEq total) by mouth daily. with food   RELION INSULIN SYRINGE 31G X 15/64" 1 ML MISC USE AS DIRECTED TWICE DAILY FOR INSULIN   [DISCONTINUED] diltiazem (DILACOR XR) 240 MG 24 hr capsule Take 1 capsule (240 mg total) by mouth daily.   No facility-administered encounter medications on file as of 05/18/2021.       Observations/Objective:   Chemistry      Component Value Date/Time   NA 136 04/14/2021 0729   NA 141 12/24/2019 1049   K 4.0 04/14/2021 0729   CL 107 04/14/2021 0729   CO2 23 04/14/2021 0729   BUN 9 04/14/2021 0729   BUN 17 12/24/2019 1049   CREATININE 1.05 (H) 04/14/2021 0729   CREATININE 1.30 (H) 09/28/2016 1004      Component Value Date/Time   CALCIUM 8.9 04/14/2021 0729   ALKPHOS 45 04/12/2021 1853   AST 15 04/12/2021 1853   ALT 12 04/12/2021 1853   BILITOT 0.6 04/12/2021 1853   BILITOT 0.4 12/24/2019 1049     Lab Results  Component Value Date   WBC 9.1 04/13/2021   HGB 12.3 04/13/2021   HCT 37.3 04/13/2021   MCV 94.2 04/13/2021   PLT 243 04/13/2021     Assessment and Plan: 1. Hospital discharge follow-up Transportation is a barrier to her getting her medications.  I see that a case manager from [facility] try to plug her in with [facility].  She tells me that she has it but there charge is cost prohibitive to her.  Another option would be to find a pharmacy that delivers.  2. Coronary artery disease of native artery of native heart with stable angina pectoris [facility]) Encouraged her to take her medicines as prescribed.  Refill sent on some of her medicines for which she will be needing refill - isosorbide mononitrate (IMDUR) 60 MG 24 hr tablet; Take 2 tablets (120 mg total) by mouth daily.  Dispense: 180 tablet; Refill: 2 - losartan (COZAAR) 100 MG tablet; Take 1 tablet (100 mg total) by mouth daily.  Dispense: 90 tablet; Refill: 1 - metoprolol (TOPROL-XL) 200 MG 24 hr tablet; Take 1 tablet (200 mg total) by mouth daily. NEED OV.  Dispense: 90 tablet; Refill: 1 - atorvastatin (LIPITOR) 80 MG tablet; TAKE 1 TABLET BY MOUTH ONCE DAILY IN THE EVENING AT 6  PM  Dispense: 90 tablet; Refill: 3  3. Chronic diastolic congestive heart failure ([place]) Encouraged her to take the furosemide as prescribed and to weigh herself at least twice a week.  If weight increases more than 5 pounds in a  week she should let me know.  4. Type 2 diabetes mellitus  with other circulatory complication, with long-term current use of insulin (HCC) Try to encourage healthy eating habits.  Encouraged her to check blood sugars if only once a day in the mornings before breakfast. - insulin lispro protamine-lispro (HUMALOG MIX 75/25) (75-25) 100 UNIT/ML SUSP injection; Inject 54 Units into the skin 2 (two) times daily with a meal.  Dispense: 30 mL; Refill: 3  5. Essential hypertension Level of control unknown as patient does not have device to check blood pressure.  Advised that she go slow with position changes. - losartan (COZAAR) 100 MG tablet; Take 1 tablet (100 mg total) by mouth daily.  Dispense: 90 tablet; Refill: 1 - diltiazem (CARDIZEM CD) 240 MG 24 hr capsule; Take 1 capsule (240 mg total) by mouth daily.  Dispense: 90 capsule; Refill: 1  6. Functional urinary incontinence Due to decreased mobility and her being on fluid pill  7. Ovarian mass, left Encouraged her to follow-up with Dr. Denman Burns.  She tells me that I can put the referral in but seen noncommitted as to whether she would keep the appointment - Ambulatory referral to Gynecology  8. Hydroureteronephrosis Encourage follow-up with urology for this. - Ambulatory referral to Urology   Follow Up Instructions: 6-8 wks   I discussed the assessment and treatment plan with the patient. The patient was provided an opportunity to ask questions and all were answered. The patient agreed with the plan and demonstrated an understanding of the instructions.   The patient was advised to call back or seek an in-person evaluation if the symptoms worsen or if the condition fails to improve as anticipated.  I  25 Spent  minutes on this telephone encounter  Karle Plumber, MD

## 2021-05-19 ENCOUNTER — Other Ambulatory Visit: Payer: Self-pay | Admitting: *Deleted

## 2021-05-19 ENCOUNTER — Telehealth: Payer: Self-pay | Admitting: Internal Medicine

## 2021-05-19 NOTE — Patient Outreach (Addendum)
[facility]) Care Management  05/19/2021  MARC SIVERTSEN 10/12/1955 [id number]   [place] coordination-pharmacy & transportation   RN CM received a message from pt's pcp on 05/18/21 1746  I spoke with her today.  It sounds as though transportation is an issue for her.  She was supposed to have an in person visit with me today but canceled and changed to telephone visit.  She tells me that she had been out of her medications for about a month until 3 days ago because they had no transportation to get to the pharmacy.  Are you aware of any pharmacy in the area that delivers medications?  It sounds as though this would be her best bet right now since they do not have a vehicle.    RN CM outreached to [place] at [facility] [phone] to confirm Mrs Fraser is eligible for medication delivery services but will need a source of payment and medications called in by her pcp to start Tuesday and or Thursday [phone] deliveries Provided this information to pcp Outreach to [phone]  No answer. [facility] RN CM left HIPAA [facility] Portability and Accountability Act) compliant voicemail message along with CM's contact info for pt and spouse Reather Converse to [phone] No answer Unable to leave a message   Plan [facility] RN CM will transfer to designated [employer] RN CM and respond to patient/husband if return call to RN CM  Forward note to pcp and pcp RN CM      Reeda Soohoo L. Lavina Hamman, RN, BSN, [place] Coordinator Office number [phone] Mobile number [phone]  Main [facility] number [phone] Fax number [phone]

## 2021-05-19 NOTE — Telephone Encounter (Signed)
-----   Message from Barbaraann Faster, RN sent at 05/19/2021 10:51 AM EDT ----- Dr Wynetta Emery  I personally assisted Mrs Royse with emergency transportation to get to the 05/18/21 1430 appt with [employer]'s transportation services staff on Friday 05/15/21  Not sure why she and [name] cancelled They were not speaking pleasantly to each other on 05/15/21  They are aware and connected to 2 alternate medical transportation services to include [employer] and [employer]'s logisticare/movicare services   [place] [phone] [address] [place] [address]) is able to accommodate her for pharmacy delivery services They will need someone in your office to call in her medication to [place] [phone] and for the [place] to provided a source of payment to initiate services    I attempted to call the home and mobile numbers to reach them to day without success   Kimberly L. Lavina Hamman, RN, BSN, [place] Coordinator Office number [phone] Main [facility] number [phone] Fax number [phone]   ----- Message ----- From: Ladell Pier, MD Sent: 05/18/2021   5:48 PM EDT To: Barbaraann Faster, RN  I am the PCP for this patient.  I spoke with her today.  It sounds as though transportation is an issue for her.  She was supposed to have an in person visit with me today but canceled and changed to telephone visit.  She tells me that she had been out of her medications for about a month until 3 days ago because they had no transportation to get to the pharmacy.  Are you aware of any pharmacy in the area that delivers medications?  It sounds as though this would be her best bet right now since they do not have a vehicle.

## 2021-05-20 ENCOUNTER — Other Ambulatory Visit: Payer: Self-pay
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
## 2021-05-20 NOTE — Telephone Encounter (Signed)
Contacted pt to go over provider message. Pt states she doesn't want to switch pharmacies. Pt states she will continue to use her current pharmacy

## 2021-05-21 ENCOUNTER — Telehealth: Payer: Self-pay | Admitting: Internal Medicine

## 2021-05-21 NOTE — Telephone Encounter (Signed)
-----   Message from Ladell Pier, MD sent at 05/18/2021  5:48 PM EDT ----- F/u in 2 mths

## 2021-05-21 NOTE — Telephone Encounter (Signed)
Spoke with Patient and she will call back later to schedule her follow up

## 2021-05-23 ENCOUNTER — Other Ambulatory Visit: Payer: Self-pay | Admitting: Internal Medicine

## 2021-05-23 DIAGNOSIS — Z794 Long term (current) use of insulin: Secondary | ICD-10-CM

## 2021-05-23 DIAGNOSIS — N950 Postmenopausal bleeding: Secondary | ICD-10-CM

## 2021-05-23 DIAGNOSIS — E538 Deficiency of other specified B group vitamins: Secondary | ICD-10-CM

## 2021-05-23 DIAGNOSIS — I10 Essential (primary) hypertension: Secondary | ICD-10-CM

## 2021-05-23 DIAGNOSIS — E876 Hypokalemia: Secondary | ICD-10-CM

## 2021-05-23 DIAGNOSIS — I25118 Atherosclerotic heart disease of native coronary artery with other forms of angina pectoris: Secondary | ICD-10-CM

## 2021-05-23 MED ORDER — FUROSEMIDE 40 MG PO TABS: 40.0000 mg | ORAL_TABLET | Freq: Two times a day (BID) | ORAL | 3 refills | Status: DC

## 2021-05-23 MED ORDER — METOPROLOL SUCCINATE ER 200 MG PO TB24: 200.0000 mg | ORAL_TABLET | Freq: Every day | ORAL | 1 refills | Status: DC

## 2021-05-23 MED ORDER — "SAFETY INSULIN SYRINGES 30G X 1/2"" 1 ML MISC": 6 refills | Status: AC

## 2021-05-23 MED ORDER — ACETAZOLAMIDE ER 500 MG PO CP12: 500.0000 mg | ORAL_CAPSULE | Freq: Two times a day (BID) | ORAL | 3 refills | Status: DC

## 2021-05-23 MED ORDER — HUMALOG MIX 75/25 (75-25) 100 UNIT/ML ~~LOC~~ SUSP: 54.0000 [IU] | Freq: Two times a day (BID) | SUBCUTANEOUS | 3 refills | Status: DC

## 2021-05-23 MED ORDER — POTASSIUM CHLORIDE CRYS ER 20 MEQ PO TBCR: 40.0000 meq | EXTENDED_RELEASE_TABLET | Freq: Every day | ORAL | 3 refills | Status: DC

## 2021-05-23 MED ORDER — LOSARTAN POTASSIUM 100 MG PO TABS: 100.0000 mg | ORAL_TABLET | Freq: Every day | ORAL | 1 refills | Status: DC

## 2021-05-23 MED ORDER — ISOSORBIDE MONONITRATE ER 60 MG PO TB24: 120.0000 mg | ORAL_TABLET | Freq: Every day | ORAL | 2 refills | Status: AC

## 2021-05-23 MED ORDER — MEGESTROL ACETATE 40 MG PO TABS: 40.0000 mg | ORAL_TABLET | Freq: Two times a day (BID) | ORAL | 3 refills | Status: DC

## 2021-05-23 MED ORDER — PANTOPRAZOLE SODIUM 40 MG PO TBEC: 40.0000 mg | DELAYED_RELEASE_TABLET | Freq: Every day | ORAL | 4 refills | Status: DC

## 2021-05-23 MED ORDER — ASPIRIN 81 MG PO TBEC: 81.0000 mg | DELAYED_RELEASE_TABLET | Freq: Every day | ORAL | 1 refills | Status: AC

## 2021-05-23 MED ORDER — DILTIAZEM HCL ER COATED BEADS 240 MG PO CP24: 240.0000 mg | ORAL_CAPSULE | Freq: Every day | ORAL | 3 refills | Status: AC

## 2021-05-23 MED ORDER — ATORVASTATIN CALCIUM 80 MG PO TABS: ORAL_TABLET | ORAL | 3 refills | Status: AC

## 2021-05-23 MED ORDER — B-12 1000 MCG PO TABS: 1000.0000 ug | ORAL_TABLET | Freq: Every day | ORAL | 1 refills | Status: DC

## 2021-06-02 ENCOUNTER — Telehealth: Payer: Self-pay

## 2021-06-02 NOTE — Telephone Encounter (Signed)
Call placed to patient # [phone] to explain the community paramedicine program and inquire if she would be interested in participating. Message left with call back requested to this CM.

## 2021-06-04 NOTE — Telephone Encounter (Signed)
Attempted to contact the patient # [phone] again to explain the community paramedicine program and inquire if she would be interested in participating. Message left with call back requested to this CM.

## 2021-06-16 ENCOUNTER — Other Ambulatory Visit: Payer: Self-pay

## 2021-06-16 NOTE — Patient Outreach (Signed)
[facility]) Care Management  06/16/2021  [name] 27-Nov-1955 [id number]   Telephone Assessment   Outreach attempt #1 to patient. No answer at present. RN CM left HIPAA compliant voicemail along with contact info.    Plan: RN CM will make outreach attempt to patient within 3-4 business days.  Enzo Montgomery, RN,BSN,CCM [place] [facility] Telephonic Care Management Coordinator Direct Phone: [phone] Toll Free: [phone] Fax: [phone]

## 2021-06-17 ENCOUNTER — Other Ambulatory Visit: Payer: Self-pay

## 2021-06-17 NOTE — Patient Outreach (Signed)
[facility]) Care Management  06/17/2021  [name] 08/13/1955 [id number]   Telephone Assessment   Unsuccessful outreach attempt to patient.      Plan: RN CM will make outreach attempt within 3-4 business days. RN CM will send unsuccessful outreach letter to patient.  Enzo Montgomery, RN,BSN,CCM [place] [facility] Telephonic Care Management Coordinator Direct Phone: [phone] Toll Free: [phone] Fax: [phone]

## 2021-06-18 ENCOUNTER — Other Ambulatory Visit: Payer: Self-pay

## 2021-06-18 NOTE — Patient Outreach (Signed)
[facility]) Care Management  06/18/2021  [name] November 23, 1955 [id number]   Telephone Assessment    Unsuccessful outreach attempt to patient.    Plan: RN CM will make outreach attempt within 4-5 wks. If no return call.   Enzo Montgomery, RN,BSN,CCM [place] [facility] Telephonic Care Management Coordinator Direct Phone: [phone] Toll Free: [phone] Fax: [phone]

## 2021-07-10 ENCOUNTER — Other Ambulatory Visit: Payer: Self-pay

## 2021-07-10 ENCOUNTER — Emergency Department (HOSPITAL_COMMUNITY): Payer: Medicare HMO

## 2021-07-10 ENCOUNTER — Inpatient Hospital Stay (HOSPITAL_COMMUNITY)
Admission: EM | Admit: 2021-07-10 | Discharge: 2021-07-17 | DRG: 637 | Disposition: A | Discharge status: Skilled Nursing Facility | Payer: Medicare HMO | Attending: Internal Medicine | Admitting: Internal Medicine

## 2021-07-10 DIAGNOSIS — R1312 Dysphagia, oropharyngeal phase: Secondary | ICD-10-CM | POA: Diagnosis not present

## 2021-07-10 DIAGNOSIS — E876 Hypokalemia: Secondary | ICD-10-CM | POA: Diagnosis present

## 2021-07-10 DIAGNOSIS — I517 Cardiomegaly: Secondary | ICD-10-CM | POA: Diagnosis not present

## 2021-07-10 DIAGNOSIS — E1100 Type 2 diabetes mellitus with hyperosmolarity without nonketotic hyperglycemic-hyperosmolar coma (NKHHC): Secondary | ICD-10-CM | POA: Diagnosis not present

## 2021-07-10 DIAGNOSIS — N189 Chronic kidney disease, unspecified: Secondary | ICD-10-CM | POA: Diagnosis not present

## 2021-07-10 DIAGNOSIS — G319 Degenerative disease of nervous system, unspecified: Secondary | ICD-10-CM | POA: Diagnosis not present

## 2021-07-10 DIAGNOSIS — I130 Hypertensive heart and chronic kidney disease with heart failure and stage 1 through stage 4 chronic kidney disease, or unspecified chronic kidney disease: Secondary | ICD-10-CM | POA: Diagnosis present

## 2021-07-10 DIAGNOSIS — E1122 Type 2 diabetes mellitus with diabetic chronic kidney disease: Secondary | ICD-10-CM | POA: Diagnosis present

## 2021-07-10 DIAGNOSIS — E875 Hyperkalemia: Secondary | ICD-10-CM | POA: Diagnosis not present

## 2021-07-10 DIAGNOSIS — E8729 Other acidosis: Secondary | ICD-10-CM | POA: Diagnosis not present

## 2021-07-10 DIAGNOSIS — R8281 Pyuria: Secondary | ICD-10-CM | POA: Diagnosis present

## 2021-07-10 DIAGNOSIS — R7989 Other specified abnormal findings of blood chemistry: Secondary | ICD-10-CM | POA: Diagnosis not present

## 2021-07-10 DIAGNOSIS — Z823 Family history of stroke: Secondary | ICD-10-CM

## 2021-07-10 DIAGNOSIS — I5032 Chronic diastolic (congestive) heart failure: Secondary | ICD-10-CM | POA: Diagnosis not present

## 2021-07-10 DIAGNOSIS — E782 Mixed hyperlipidemia: Secondary | ICD-10-CM | POA: Diagnosis present

## 2021-07-10 DIAGNOSIS — I10 Essential (primary) hypertension: Secondary | ICD-10-CM | POA: Diagnosis not present

## 2021-07-10 DIAGNOSIS — E11649 Type 2 diabetes mellitus with hypoglycemia without coma: Secondary | ICD-10-CM | POA: Diagnosis not present

## 2021-07-10 DIAGNOSIS — E1140 Type 2 diabetes mellitus with diabetic neuropathy, unspecified: Secondary | ICD-10-CM | POA: Diagnosis present

## 2021-07-10 DIAGNOSIS — G4733 Obstructive sleep apnea (adult) (pediatric): Secondary | ICD-10-CM | POA: Diagnosis present

## 2021-07-10 DIAGNOSIS — R8271 Bacteriuria: Secondary | ICD-10-CM | POA: Diagnosis present

## 2021-07-10 DIAGNOSIS — E113593 Type 2 diabetes mellitus with proliferative diabetic retinopathy without macular edema, bilateral: Secondary | ICD-10-CM | POA: Diagnosis not present

## 2021-07-10 DIAGNOSIS — R9431 Abnormal electrocardiogram [ECG] [EKG]: Secondary | ICD-10-CM | POA: Diagnosis present

## 2021-07-10 DIAGNOSIS — I2510 Atherosclerotic heart disease of native coronary artery without angina pectoris: Secondary | ICD-10-CM | POA: Diagnosis present

## 2021-07-10 DIAGNOSIS — N184 Chronic kidney disease, stage 4 (severe): Secondary | ICD-10-CM | POA: Diagnosis present

## 2021-07-10 DIAGNOSIS — E1110 Type 2 diabetes mellitus with ketoacidosis without coma: Principal | ICD-10-CM | POA: Diagnosis present

## 2021-07-10 DIAGNOSIS — M6281 Muscle weakness (generalized): Secondary | ICD-10-CM | POA: Diagnosis not present

## 2021-07-10 DIAGNOSIS — I252 Old myocardial infarction: Secondary | ICD-10-CM

## 2021-07-10 DIAGNOSIS — R4182 Altered mental status, unspecified: Secondary | ICD-10-CM

## 2021-07-10 DIAGNOSIS — X58XXXA Exposure to other specified factors, initial encounter: Secondary | ICD-10-CM | POA: Diagnosis present

## 2021-07-10 DIAGNOSIS — H409 Unspecified glaucoma: Secondary | ICD-10-CM | POA: Diagnosis present

## 2021-07-10 DIAGNOSIS — E860 Dehydration: Secondary | ICD-10-CM | POA: Diagnosis not present

## 2021-07-10 DIAGNOSIS — G9341 Metabolic encephalopathy: Secondary | ICD-10-CM | POA: Diagnosis present

## 2021-07-10 DIAGNOSIS — Z7401 Bed confinement status: Secondary | ICD-10-CM | POA: Diagnosis not present

## 2021-07-10 DIAGNOSIS — Z20822 Contact with and (suspected) exposure to covid-19: Secondary | ICD-10-CM | POA: Diagnosis not present

## 2021-07-10 DIAGNOSIS — R404 Transient alteration of awareness: Secondary | ICD-10-CM | POA: Diagnosis not present

## 2021-07-10 DIAGNOSIS — R410 Disorientation, unspecified: Secondary | ICD-10-CM | POA: Diagnosis not present

## 2021-07-10 DIAGNOSIS — Z79899 Other long term (current) drug therapy: Secondary | ICD-10-CM

## 2021-07-10 DIAGNOSIS — W19XXXA Unspecified fall, initial encounter: Secondary | ICD-10-CM | POA: Diagnosis present

## 2021-07-10 DIAGNOSIS — E440 Moderate protein-calorie malnutrition: Secondary | ICD-10-CM | POA: Diagnosis not present

## 2021-07-10 DIAGNOSIS — R739 Hyperglycemia, unspecified: Secondary | ICD-10-CM | POA: Diagnosis not present

## 2021-07-10 DIAGNOSIS — R6889 Other general symptoms and signs: Secondary | ICD-10-CM | POA: Diagnosis not present

## 2021-07-10 DIAGNOSIS — E46 Unspecified protein-calorie malnutrition: Secondary | ICD-10-CM | POA: Diagnosis not present

## 2021-07-10 DIAGNOSIS — R58 Hemorrhage, not elsewhere classified: Secondary | ICD-10-CM | POA: Diagnosis not present

## 2021-07-10 DIAGNOSIS — R296 Repeated falls: Secondary | ICD-10-CM | POA: Diagnosis present

## 2021-07-10 DIAGNOSIS — Z7982 Long term (current) use of aspirin: Secondary | ICD-10-CM

## 2021-07-10 DIAGNOSIS — H548 Legal blindness, as defined in USA: Secondary | ICD-10-CM | POA: Diagnosis present

## 2021-07-10 DIAGNOSIS — I499 Cardiac arrhythmia, unspecified: Secondary | ICD-10-CM | POA: Diagnosis not present

## 2021-07-10 DIAGNOSIS — N839 Noninflammatory disorder of ovary, fallopian tube and broad ligament, unspecified: Secondary | ICD-10-CM | POA: Diagnosis present

## 2021-07-10 DIAGNOSIS — Z794 Long term (current) use of insulin: Secondary | ICD-10-CM

## 2021-07-10 DIAGNOSIS — R400 Somnolence: Secondary | ICD-10-CM | POA: Diagnosis not present

## 2021-07-10 DIAGNOSIS — Z9114 Patient's other noncompliance with medication regimen: Secondary | ICD-10-CM | POA: Diagnosis not present

## 2021-07-10 DIAGNOSIS — R971 Elevated cancer antigen 125 [CA 125]: Secondary | ICD-10-CM | POA: Diagnosis present

## 2021-07-10 DIAGNOSIS — Z8249 Family history of ischemic heart disease and other diseases of the circulatory system: Secondary | ICD-10-CM

## 2021-07-10 DIAGNOSIS — R41841 Cognitive communication deficit: Secondary | ICD-10-CM | POA: Diagnosis not present

## 2021-07-10 DIAGNOSIS — Z888 Allergy status to other drugs, medicaments and biological substances status: Secondary | ICD-10-CM

## 2021-07-10 DIAGNOSIS — N179 Acute kidney failure, unspecified: Secondary | ICD-10-CM | POA: Diagnosis present

## 2021-07-10 DIAGNOSIS — Z6841 Body Mass Index (BMI) 40.0 and over, adult: Secondary | ICD-10-CM | POA: Diagnosis not present

## 2021-07-10 DIAGNOSIS — E8809 Other disorders of plasma-protein metabolism, not elsewhere classified: Secondary | ICD-10-CM

## 2021-07-10 DIAGNOSIS — E1143 Type 2 diabetes mellitus with diabetic autonomic (poly)neuropathy: Secondary | ICD-10-CM | POA: Diagnosis not present

## 2021-07-10 DIAGNOSIS — D500 Iron deficiency anemia secondary to blood loss (chronic): Secondary | ICD-10-CM | POA: Diagnosis present

## 2021-07-10 DIAGNOSIS — K219 Gastro-esophageal reflux disease without esophagitis: Secondary | ICD-10-CM | POA: Diagnosis present

## 2021-07-10 DIAGNOSIS — N939 Abnormal uterine and vaginal bleeding, unspecified: Secondary | ICD-10-CM | POA: Diagnosis present

## 2021-07-10 DIAGNOSIS — Z743 Need for continuous supervision: Secondary | ICD-10-CM | POA: Diagnosis not present

## 2021-07-10 DIAGNOSIS — Z833 Family history of diabetes mellitus: Secondary | ICD-10-CM

## 2021-07-10 LAB — I-STAT CHEM 8, ED
BUN: 36 mg/dL — ABNORMAL HIGH (ref 8–23)
Calcium, Ion: 1.03 mmol/L — ABNORMAL LOW (ref 1.15–1.40)
Chloride: 97 mmol/L — ABNORMAL LOW (ref 98–111)
Creatinine, Ser: 1.9 mg/dL — ABNORMAL HIGH (ref 0.44–1.00)
Glucose, Bld: 484 mg/dL — ABNORMAL HIGH (ref 70–99)
HCT: 42 % (ref 36.0–46.0)
Hemoglobin: 14.3 g/dL (ref 12.0–15.0)
Potassium: 6.7 mmol/L (ref 3.5–5.1)
Sodium: 129 mmol/L — ABNORMAL LOW (ref 135–145)
TCO2: 26 mmol/L (ref 22–32)

## 2021-07-10 LAB — I-STAT VENOUS BLOOD GAS, ED
Acid-Base Excess: 1 mmol/L (ref 0.0–2.0)
Bicarbonate: 27.4 mmol/L (ref 20.0–28.0)
Calcium, Ion: 1.03 mmol/L — ABNORMAL LOW (ref 1.15–1.40)
HCT: 41 % (ref 36.0–46.0)
Hemoglobin: 13.9 g/dL (ref 12.0–15.0)
O2 Saturation: 73 %
Potassium: 6.7 mmol/L (ref 3.5–5.1)
Sodium: 130 mmol/L — ABNORMAL LOW (ref 135–145)
TCO2: 29 mmol/L (ref 22–32)
pCO2, Ven: 50.6 mmHg (ref 44.0–60.0)
pH, Ven: 7.342 (ref 7.250–7.430)
pO2, Ven: 42 mmHg (ref 32.0–45.0)

## 2021-07-10 LAB — CBC WITH DIFFERENTIAL/PLATELET
Abs Immature Granulocytes: 0.04 10*3/uL (ref 0.00–0.07)
Basophils Absolute: 0 10*3/uL (ref 0.0–0.1)
Basophils Relative: 0 %
Eosinophils Absolute: 0.1 10*3/uL (ref 0.0–0.5)
Eosinophils Relative: 1 %
HCT: 43.9 % (ref 36.0–46.0)
Hemoglobin: 14.4 g/dL (ref 12.0–15.0)
Immature Granulocytes: 0 %
Lymphocytes Relative: 13 %
Lymphs Abs: 1.4 10*3/uL (ref 0.7–4.0)
MCH: 30.8 pg (ref 26.0–34.0)
MCHC: 32.8 g/dL (ref 30.0–36.0)
MCV: 94 fL (ref 80.0–100.0)
Monocytes Absolute: 0.9 10*3/uL (ref 0.1–1.0)
Monocytes Relative: 9 %
Neutro Abs: 8.3 10*3/uL — ABNORMAL HIGH (ref 1.7–7.7)
Neutrophils Relative %: 77 %
Platelets: 237 10*3/uL (ref 150–400)
RBC: 4.67 MIL/uL (ref 3.87–5.11)
RDW: 13.1 % (ref 11.5–15.5)
WBC: 10.6 10*3/uL — ABNORMAL HIGH (ref 4.0–10.5)
nRBC: 0 % (ref 0.0–0.2)

## 2021-07-10 LAB — BASIC METABOLIC PANEL
Anion gap: 15 (ref 5–15)
BUN: 28 mg/dL — ABNORMAL HIGH (ref 8–23)
CO2: 20 mmol/L — ABNORMAL LOW (ref 22–32)
Calcium: 9.2 mg/dL (ref 8.9–10.3)
Chloride: 96 mmol/L — ABNORMAL LOW (ref 98–111)
Creatinine, Ser: 1.95 mg/dL — ABNORMAL HIGH (ref 0.44–1.00)
GFR, Estimated: 28 mL/min — ABNORMAL LOW (ref >60)
Glucose, Bld: 487 mg/dL — ABNORMAL HIGH (ref 70–99)
Potassium: 3.4 mmol/L — ABNORMAL LOW (ref 3.5–5.1)
Sodium: 131 mmol/L — ABNORMAL LOW (ref 135–145)

## 2021-07-10 LAB — AMMONIA: Ammonia: 32 umol/L (ref 9–35)

## 2021-07-10 LAB — TROPONIN I (HIGH SENSITIVITY)
Troponin I (High Sensitivity): 16 ng/L (ref <18)
Troponin I (High Sensitivity): 14 ng/L (ref <18)

## 2021-07-10 LAB — RESP PANEL BY RT-PCR (FLU A&B, COVID) ARPGX2
Influenza A by PCR: NEGATIVE
Influenza B by PCR: NEGATIVE
SARS Coronavirus 2 by RT PCR: NEGATIVE

## 2021-07-10 LAB — URINALYSIS, ROUTINE W REFLEX MICROSCOPIC
Bilirubin Urine: NEGATIVE
Glucose, UA: >=500 mg/dL — AB
Ketones, ur: 5 mg/dL — AB
Nitrite: NEGATIVE
Protein, ur: 30 mg/dL — AB
RBC / HPF: >50 RBC/hpf — ABNORMAL HIGH (ref 0–5)
Specific Gravity, Urine: 1.022 (ref 1.005–1.030)
WBC, UA: >50 WBC/hpf — ABNORMAL HIGH (ref 0–5)
pH: 5 (ref 5.0–8.0)

## 2021-07-10 LAB — CK: Total CK: 72 U/L (ref 38–234)

## 2021-07-10 LAB — HEPATIC FUNCTION PANEL
ALT: 9 U/L (ref 0–44)
AST: 12 U/L — ABNORMAL LOW (ref 15–41)
Albumin: 1.9 g/dL — ABNORMAL LOW (ref 3.5–5.0)
Alkaline Phosphatase: 45 U/L (ref 38–126)
Bilirubin, Direct: 0.2 mg/dL (ref 0.0–0.2)
Indirect Bilirubin: 0.5 mg/dL (ref 0.3–0.9)
Total Bilirubin: 0.7 mg/dL (ref 0.3–1.2)
Total Protein: 4.8 g/dL — ABNORMAL LOW (ref 6.5–8.1)

## 2021-07-10 LAB — CBG MONITORING, ED
Glucose-Capillary: 261 mg/dL — ABNORMAL HIGH (ref 70–99)
Glucose-Capillary: 284 mg/dL — ABNORMAL HIGH (ref 70–99)
Glucose-Capillary: 366 mg/dL — ABNORMAL HIGH (ref 70–99)
Glucose-Capillary: 434 mg/dL — ABNORMAL HIGH (ref 70–99)
Glucose-Capillary: 483 mg/dL — ABNORMAL HIGH (ref 70–99)

## 2021-07-10 LAB — BETA-HYDROXYBUTYRIC ACID: Beta-Hydroxybutyric Acid: 0.71 mmol/L — ABNORMAL HIGH (ref 0.05–0.27)

## 2021-07-10 LAB — BRAIN NATRIURETIC PEPTIDE: B Natriuretic Peptide: 36.1 pg/mL (ref 0.0–100.0)

## 2021-07-10 LAB — TSH: TSH: 0.235 u[IU]/mL — ABNORMAL LOW (ref 0.350–4.500)

## 2021-07-10 MED ORDER — DEXTROSE IN LACTATED RINGERS 5 % IV SOLN: INTRAVENOUS | Status: DC

## 2021-07-10 MED ORDER — INSULIN REGULAR(HUMAN) IN NACL 100-0.9 UT/100ML-% IV SOLN
INTRAVENOUS | Status: DC
  Administered 2021-07-10: 5 [IU]/h via INTRAVENOUS
  Filled 2021-07-10: qty 100

## 2021-07-10 MED ORDER — LACTATED RINGERS IV BOLUS
20.0000 mL/kg | Freq: Once | INTRAVENOUS | Status: AC
  Administered 2021-07-10: 2000 mL via INTRAVENOUS

## 2021-07-10 MED ORDER — CALCIUM GLUCONATE-NACL 1-0.675 GM/50ML-% IV SOLN
1.0000 g | Freq: Once | INTRAVENOUS | Status: AC
  Administered 2021-07-10: 1000 mg via INTRAVENOUS
  Filled 2021-07-10: qty 50

## 2021-07-10 MED ORDER — LACTATED RINGERS IV SOLN: INTRAVENOUS | Status: DC

## 2021-07-10 MED ORDER — SODIUM CHLORIDE 0.9 % IV BOLUS
250.0000 mL | Freq: Once | INTRAVENOUS | Status: AC
  Administered 2021-07-10: 250 mL via INTRAVENOUS

## 2021-07-10 MED ORDER — ENOXAPARIN SODIUM 60 MG/0.6ML IJ SOSY
55.0000 mg | PREFILLED_SYRINGE | INTRAMUSCULAR | Status: DC
  Administered 2021-07-10 – 2021-07-16 (×7): 55 mg via SUBCUTANEOUS
  Filled 2021-07-10 (×6): qty 0.6
  Filled 2021-07-10: qty 0.55

## 2021-07-10 MED ORDER — LACTATED RINGERS IV BOLUS: 20.0000 mL/kg | Freq: Once | INTRAVENOUS | Status: DC

## 2021-07-10 MED ORDER — INSULIN ASPART 100 UNIT/ML IV SOLN
10.0000 [IU] | Freq: Once | INTRAVENOUS | Status: AC
  Administered 2021-07-10: 10 [IU] via INTRAVENOUS

## 2021-07-10 MED ORDER — DEXTROSE 50 % IV SOLN: 0.0000 mL | INTRAVENOUS | Status: DC | PRN

## 2021-07-10 MED ORDER — INSULIN REGULAR(HUMAN) IN NACL 100-0.9 UT/100ML-% IV SOLN
INTRAVENOUS | Status: DC
  Administered 2021-07-10: 3 [IU]/h via INTRAVENOUS

## 2021-07-10 MED ORDER — ALBUTEROL SULFATE (2.5 MG/3ML) 0.083% IN NEBU: 10.0000 mg | INHALATION_SOLUTION | Freq: Once | RESPIRATORY_TRACT | Status: DC

## 2021-07-10 MED ORDER — POTASSIUM CHLORIDE 10 MEQ/100ML IV SOLN
10.0000 meq | INTRAVENOUS | Status: AC
  Administered 2021-07-10 (×2): 10 meq via INTRAVENOUS
  Filled 2021-07-10 (×2): qty 100

## 2021-07-10 MED ORDER — DEXTROSE 50 % IV SOLN
0.0000 mL | INTRAVENOUS | Status: DC | PRN
  Administered 2021-07-15: 04:00:00 25 mL via INTRAVENOUS
  Administered 2021-07-16: 50 mL via INTRAVENOUS
  Filled 2021-07-10 (×2): qty 50

## 2021-07-10 MED ORDER — POTASSIUM CHLORIDE 10 MEQ/100ML IV SOLN
10.0000 meq | INTRAVENOUS | Status: DC
  Administered 2021-07-10: 10 meq via INTRAVENOUS
  Filled 2021-07-10: qty 100

## 2021-07-10 MED ORDER — SODIUM CHLORIDE 0.9 % IV BOLUS
500.0000 mL | Freq: Once | INTRAVENOUS | Status: AC
  Administered 2021-07-10: 500 mL via INTRAVENOUS

## 2021-07-10 NOTE — ED Triage Notes (Signed)
Pt presents via EMS. EMS reports they were at the home yesterday to help lift when pt slid from wheelchair to floor. Husband stated pt  did the same thing again last night and just slept on the floor.  Pt CBG with EMS 551.  Significantly altered compared to yesterday when EMS was with her.  Pt non compliant with meds.  Pt husband states she also has vaginal bleeding that has been evaluated and hysterectomy recommended but pt will not consent to that.

## 2021-07-10 NOTE — ED Notes (Signed)
Pt's CBG still remains high after fluids and insulin - 434. EDP Dr. Sherry Ruffing made aware.

## 2021-07-10 NOTE — H&P (Addendum)
History and Physical  [name] IOM:[id number] DOB: September 08, 1955 DOA: 07/10/2021  Referring physician: Lawana Chambers PCP: Ladell Pier, MD  Patient coming from: Home  Chief Complaint: Altered mental status and falls  HPI: [name] is a 65 y.o. female with medical history significant for HFpEF, hypertension, obesity, hyperlipidemia, T2DM, glaucoma, GERD, CAD, CKD stage 3B who presents to the emergency department via EMS due to fall/trauma and altered mental status.  Patient was unable to provide history, history was obtained from ED physician and ED medical record.  Per report, EMS was activated yesterday to help lift patient when she slid from wheelchair to floor, she refused to go to the ED for further evaluation at that time.  Husband reported that patient slid to the floor again last night and she ended up sleeping on the floor overnight.  EMS was again activated today and patient was noted to be somnolent, CBG was elevated at 551, she has not been compliant with her medications.  Patient was taken to the ED for further evaluation and management. It was also reported that patient's husband states that patient also refused recommended hysterectomy due to vaginal bleeding that she had, but she refused.  ED Course:  In the emergency department, she was tachypneic and CBG was 140/128.  Work-up in the ED showed mild leukocytosis, initial i-STAT chemistry 8 showed hyperkalemia (K 6.7), VBG also showed hyperkalemia, ammonia and BNP levels were normal.  Beta hydroxybutyric acid 0.71, blood glucose 487.  Urinalysis was positive for glycosuria, hematuria, RBC and WBC and few bacteria.  BUN/creatinine at 28/1.95 (baseline creatinine at 0.8-1.1).  Total CK 72.  Influenza a, B, SARS coronavirus was negative. CT of head without contrast showed no acute intracranial process. Chest x-ray showed no acute cardiopulmonary process Calcium gluconate was given due to elevated calcium  level, BMP showed that potassium level was actually 3.4 and patient was started on insulin drip per Endo tool.  Hospitalist was asked to admit patient for further evaluation and management.  Review of Systems: This cannot be obtained at this time due to patient's current condition  Past Medical History:  Diagnosis Date   Blind right eye    Chronic back pain    "my whole back" (03/18/2015)   Chronic chest pain    takes isosorbide   Chronic diastolic heart failure ([place]) 9/13   echo 03/20/15 LV Ef of 60-65%, grade 2DD and  PA peak pressure: 36 mm Hg    CKD (chronic kidney disease), stage III (HCC)    Coronary artery disease    Dr Sallyanne Kuster, cardiac cath 02-07-2009 -- diffuse 3V CAD involving RCA, CFx, and D1,  pt not a candidate for bypass surgery or angioplasty,  medical management   Diabetic neuropathy ([place])    Diabetic retinopathy ([place])    bilateral proliferative   Diastolic CHF, chronic ([place])    Dyspnea    "always"   Edema of both lower extremities    Generalized weakness    GERD (gastroesophageal reflux disease)    Glaucoma, both eyes    History of non-ST elevation myocardial infarction (NSTEMI)    02-25-2009;  05-15-2009;  01-08-2010;  04-04-2012 this MI was post-op surgery for abscess on 04-03-2012   History of sepsis    Hyperlipemia    Hypertension    Insulin dependent type 2 diabetes mellitus ([place])    followed by pcp   Iron deficiency anemia due to chronic blood loss    uterine bleeding  Legally blind in left eye, as defined in [place]    per pt states vision "is foggy"   Mild obstructive sleep apnea    study in epic 04-19-2012 mild osa,  recommendation's were wt. loss, dental  appliance, surgery, or cpap   Morbid obesity with BMI of 40.0-44.9, adult (HCC)    OA (osteoarthritis)    knees   PSVT (paroxysmal supraventricular tachycardia) ([place])    Renal insufficiency    pt. denies   Seasonal allergies    Wears glasses    Past Surgical History:  Procedure Laterality Date    BIOPSY  02/13/2018   Procedure: BIOPSY;  Surgeon: Ladene Artist, MD;  Location: [facility];  Service: Endoscopy;;   BREAST SURGERY Right 2019    breast biopsy,  per pt benign   CARDIAC CATHETERIZATION  02-27-2009   dr al little   diffuse 3V CAD , involving RCA, CFx, and D1;  inferior wall abnormalities, low normal ef 50%   CATARACT EXTRACTION Right    CATARACT EXTRACTION W/ INTRAOCULAR LENS IMPLANT Left 10-11-2007   _0    COLONOSCOPY WITH PROPOFOL N/A 02/13/2018   Procedure: COLONOSCOPY WITH PROPOFOL;  Surgeon: Ladene Artist, MD;  Location: [facility];  Service: Endoscopy;  Laterality: N/A;   DILATION AND CURETTAGE OF UTERUS  x2 last one 2000   EYE SURGERY Bilateral 2009 to 2010   x2  left eye;  x2  right eye    HYSTEROSCOPY WITH D & C N/A 10/11/2018   Procedure: DILATATION AND CURETTAGE /HYSTEROSCOPY;  Surgeon: [name], MD;  Location: [facility];  Service: Gynecology;  Laterality: N/A;   INCISION AND DRAINAGE PERIRECTAL ABSCESS  04/03/2012   INTRAUTERINE DEVICE (IUD) INSERTION  10/11/2018   Procedure: INTRAUTERINE DEVICE (IUD) INSERTION;  Surgeon: [name], MD;  Location: [facility];  Service: Gynecology;;   IR US GUIDE VASC ACCESS LEFT  02/13/2018   IR VENIPUNCTURE 74YRS/OLDER BY MD  02/13/2018   POLYPECTOMY  02/13/2018   Procedure: POLYPECTOMY;  Surgeon: Ladene Artist, MD;  Location: [facility];  Service: Endoscopy;;    Social History:  reports that she has never smoked. She has never used smokeless tobacco. She reports that she does not drink alcohol and does not use drugs.   Allergies  Allergen Reactions   Ativan [Lorazepam] Anaphylaxis   Orange Fruit [Citrus] Anaphylaxis and Other (See Comments)    Pt stated throat swelling, itching    Family History  Problem Relation Age of Onset   Diabetes Mother    Heart disease Mother    Heart attack Mother    Hypertension Mother    Breast cancer Mother    Colon polyps Father    Diabetes Father    Heart disease Father     Heart attack Father    Stroke Father    Hypertension Father    Diabetes Brother    Diabetes Sister    Heart disease Brother    Heart disease Sister    Diabetes Maternal Grandmother    Hypertension Maternal Grandmother    Hypertension Brother    Hypertension Sister    Breast cancer Cousin    Colon cancer Cousin    Esophageal cancer Neg Hx    Rectal cancer Neg Hx    Stomach cancer Neg Hx     Prior to Admission medications   Medication Sig Start Date End Date Taking? Authorizing Provider  Accu-Chek Softclix Lancets lancets Use as instructed for 3 times daily blood glucose monitoring  12/24/19   Ladell Pier, MD  acetaZOLAMIDE ER (DIAMOX) 500 MG capsule Take 1 capsule (500 mg total) by mouth 2 (two) times daily. 05/23/21   Ladell Pier, MD  aspirin 81 MG EC tablet Take 1 tablet (81 mg total) by mouth daily. 05/23/21   Ladell Pier, MD  atorvastatin (LIPITOR) 80 MG tablet TAKE 1 TABLET BY MOUTH ONCE DAILY IN THE EVENING AT 6  PM 05/23/21   Ladell Pier, MD  bismuth subsalicylate (PEPTO BISMOL) 262 MG/15ML suspension Take 30 mLs by mouth every 6 (six) hours as needed for indigestion.    [provider]  Blood Glucose Monitoring Suppl (ACCU-CHEK AVIVA PLUS) w/Device KIT Used as directed 09/29/18   Ladell Pier, MD  Blood Pressure Monitoring (BLOOD PRESSURE MONITOR/ARM) DEVI 1 each by Does not apply route daily. ICD 10, I10 and I50.32 03/07/17   Funches, Josalyn, MD  brimonidine-timolol (COMBIGAN) 0.2-0.5 % ophthalmic solution Place 1 drop into both eyes 2 (two) times daily. 04/14/21   Marianna Payment, MD  Cyanocobalamin (B-12) 1000 MCG TABS Take 1 tablet (1,000 mcg total) by mouth daily. 05/23/21   Ladell Pier, MD  diltiazem (CARDIZEM CD) 240 MG 24 hr capsule Take 1 capsule (240 mg total) by mouth daily. 05/23/21   Ladell Pier, MD  furosemide (LASIX) 40 MG tablet Take 1 tablet (40 mg total) by mouth 2 (two) times daily. 05/23/21   Ladell Pier, MD  glucose blood test strip Use TID before meals / Dx E11.9 04/14/21   Marianna Payment, MD  glucose monitoring kit (FREESTYLE) monitoring kit 1 each by Does not apply route 4 (four) times daily - after meals and at bedtime. 09/12/15   Funches, Adriana Mccallum, MD  insulin lispro protamine-lispro (HUMALOG MIX 75/25) (75-25) 100 UNIT/ML SUSP injection Inject 54 Units into the skin 2 (two) times daily with a meal. 05/23/21   Ladell Pier, MD  Insulin Syringe-Needle U-100 (SAFETY INSULIN SYRINGES) 30G X 1/2" 1 ML MISC Use as directed with insulin 05/23/21   Ladell Pier, MD  isosorbide mononitrate (IMDUR) 60 MG 24 hr tablet Take 2 tablets (120 mg total) by mouth daily. 05/23/21   Ladell Pier, MD  latanoprost (XALATAN) 0.005 % ophthalmic solution Place 1 drop into the right eye at bedtime. 04/14/21   Marianna Payment, MD  losartan (COZAAR) 100 MG tablet Take 1 tablet (100 mg total) by mouth daily. 05/23/21   Ladell Pier, MD  megestrol (MEGACE) 40 MG tablet Take 1 tablet (40 mg total) by mouth 2 (two) times daily. 05/23/21   Ladell Pier, MD  Menthol-Methyl Salicylate (MUSCLE RUB) 10-15 % CREA Apply 1 application topically daily as needed (knee pain). 04/14/21   Marianna Payment, MD  metoprolol (TOPROL-XL) 200 MG 24 hr tablet Take 1 tablet (200 mg total) by mouth daily. . 05/23/21   Ladell Pier, MD  nitroGLYCERIN (NITROSTAT) 0.4 MG SL tablet DISSOLVE ONE TABLET UNDER THE TONGUE EVERY 5 MINUTES AS NEEDED FOR CHEST PAIN.  DO NOT EXCEED A TOTAL OF 3 DOSES IN 15 MINUTES 04/14/21   Marianna Payment, MD  pantoprazole (PROTONIX) 40 MG tablet Take 1 tablet (40 mg total) by mouth daily. 05/23/21   Ladell Pier, MD  potassium chloride SA (KLOR-CON) 20 MEQ tablet Take 2 tablets (40 mEq total) by mouth daily. with food 05/23/21   Ladell Pier, MD  RELION INSULIN SYRINGE 31G X 15/64" 1 ML MISC USE AS DIRECTED TWICE  DAILY FOR INSULIN 04/30/21   Ladell Pier, MD  diltiazem  (DILACOR XR) 240 MG 24 hr capsule Take 1 capsule (240 mg total) by mouth daily. 12/03/20 04/14/21  Shawna Clamp, MD    Physical Exam: BP (!) 176/80   Pulse 75   Temp 97.6 F (36.4 C) (Oral)   Resp 20   Ht _0  (1.6 m)   Wt 117.9 kg   LMP 04/02/2012   SpO2 100%   BMI 46.06 kg/m   General: 65 y.o. year-old obese female well developed well nourished in no acute distress.  Alert and oriented x3. HEENT: Dry mucous membrane.  NCAT, Right eye cataract noted  Neck: Supple, trachea medial Cardiovascular: Regular rate and rhythm with no rubs or gallops.  No thyromegaly or JVD noted. 2/4 pulses in all 4 extremities. Respiratory: Clear to auscultation with no wheezes or rales. Good inspiratory effort. Abdomen: Soft, nontender nondistended with normal bowel sounds x4 quadrants. Muskuloskeletal: No cyanosis, clubbing or edema noted bilaterally Neuro: Disoriented.  Moving all extremities, patient does not follow commands.   Skin: No ulcerative lesions noted or rashes Psychiatry: This cannot be assessed at this time due to patient's current condition         Labs on Admission:  Basic Metabolic Panel: Recent Labs  Lab 07/10/21 1740 07/10/21 1837  NA 131* 130*  129*  K 3.4* 6.7*  6.7*  CL 96* 97*  CO2 20*  --   GLUCOSE 487* 484*  BUN 28* 36*  CREATININE 1.95* 1.90*  CALCIUM 9.2  --    Liver Function Tests: Recent Labs  Lab 07/10/21 2000  AST 12*  ALT 9  ALKPHOS 45  BILITOT 0.7  PROT 4.8*  ALBUMIN 1.9*   No results for input(s): LIPASE, AMYLASE in the last 168 hours. Recent Labs  Lab 07/10/21 1740  AMMONIA 32   CBC: Recent Labs  Lab 07/10/21 1740 07/10/21 1837  WBC 10.6*  --   NEUTROABS 8.3*  --   HGB 14.4 13.9  14.3  HCT 43.9 41.0  42.0  MCV 94.0  --   PLT 237  --    Cardiac Enzymes: Recent Labs  Lab 07/10/21 1923  CKTOTAL 72    BNP (last 3 results) Recent Labs    04/12/21 1853 07/10/21 1740  BNP 63.1 36.1    ProBNP (last 3 results) No results  for input(s): PROBNP in the last 8760 hours.  CBG: Recent Labs  Lab 07/10/21 1647 07/10/21 2013  GLUCAP 483* 434*    Radiological Exams on Admission: CT Head Wo Contrast  Result Date: 07/10/2021 CLINICAL DATA:  Altered mental status, delirium EXAM: CT HEAD WITHOUT CONTRAST TECHNIQUE: Contiguous axial images were obtained from the base of the skull through the vertex without intravenous contrast. COMPARISON:  12/07/2020 FINDINGS: Brain: No evidence of acute infarction, hemorrhage, cerebral edema, mass, mass effect, or midline shift. No hydrocephalus or extra-axial fluid collection. Mild generalized atrophy. Periventricular white matter changes, likely the sequela of chronic small vessel ischemic disease. Vascular: No hyperdense vessel. Skull: Normal. Negative for fracture or focal lesion. Sinuses/Orbits: No acute finding. Other: The mastoid air cells are well aerated. IMPRESSION: IMPRESSION No acute intracranial process. No etiology is seen for the patient's altered mental status. Electronically Signed   By: Merilyn Baba M.D.   On: 07/10/2021 18:59   DG Chest Portable 1 View  Result Date: 07/10/2021 CLINICAL DATA:  Altered mental status. EXAM: PORTABLE CHEST 1 VIEW COMPARISON:  Chest x-ray 04/12/2021. FINDINGS: Cardiac  silhouette is enlarged, unchanged. Aorta is tortuous. There is no focal lung infiltrate, pleural effusion or pneumothorax. No acute fractures are seen. IMPRESSION: 1. No acute cardiopulmonary process. 2. Stable cardiomegaly. Electronically Signed   By: Ronney Asters M.D.   On: 07/10/2021 17:09    EKG: I independently viewed the EKG done and my findings are as followed: Normal sinus rhythm at a rate of 75 bpm with PVCs.  Qtc-51m  Assessment/Plan Present on Admission:  Hyperosmolar hyperglycemic state (HHS) ([place]  Principal Problem:   Hyperosmolar hyperglycemic state (HHS) ([place]  Altered mental status secondary to anion gap metabolic acidosis due to HHS vs early  DKA Hyperglycemia secondary to poorly controlled type 2 diabetes mellitus Continue insulin drip, IV LR with IV potassium per DKA protocol Transition IV LR to D5 LR when serum glucose reaches 2575mdL Continue serial BMPs Continue to monitor anion gap closure prior to transitioning patient to subcu insulin Continue NPO  Dehydration Continue IV hydration  Pseudohyponatremia Na 131; corrected sodium level based on hyperglycemia (487) is 137 Continue to monitor sodium levels  Prolonged QTc (509 ms) Avoid QT prolonging drugs K+ 3.4, this will be replenished Magnesium level will be checked Repeat EKG in the morning  Hypokalemia K+ is 3.4 K+ will be replenished Please monitor for AM K+ for further replenishmemnt  Recurrent falls possibly secondary to HHS/Ambulatory difficulty Continue fall precaution, neurochecks Continue PT/OT eval and treat  Hypoalbuminemia possibly secondary to moderate protein calorie malnutrition Alb, 1.9, consider protein supplement when patient resumes oral intake  AKI on CKD stage IV BUN/creatinine at 28/1.95 (baseline creatinine at 0.8-1.1) Continue IV hydration Renally adjust medications, avoid nephrotoxic agents/dehydration/hypotension  Low TSH TSH 0.235, T4 level will be checked, though it is always better to check TSH when patient does not have an acute illness.  Noncompliance with medication regimen Patient will be counseled on importance of being compliant with medication regimen when more alert  HFpEF/CAD Continue home meds when patient resumes oral intake  Essential hypertension Continue IV hydralazine 10 mg every 6 hours as needed for SBP > 170  Obesity class III (BMI 46.06kg/m) Patient will be counseled on diet and lifestyle modification when more alert  Mixed hyperlipidemia Continue home statin when patient resumes oral intake  Glaucoma Continue Combigan  GERD Continue Protonix   DVT prophylaxis: Lovenox  Code Status:  Full code  Family Communication: None at bedside  Disposition Plan:  Patient is from:                        home Anticipated DC to:                   SNF or family members home Anticipated DC date:               2-3 days Anticipated DC barriers:         Patient requires inpatient management due to altered mental status due to hyperglycemia secondary to HHS versus early DKA requiring insulin drip   Consults called: None   Admission status: Observation    OlBernadette HoitD [facility]  07/10/2021, 9:12 PM

## 2021-07-10 NOTE — ED Provider Notes (Addendum)
[facility] EMERGENCY DEPARTMENT Provider Note   CSN: [id number] Arrival date & time: 07/10/21  1502     History Chief Complaint  Patient presents with   Altered Mental Status    [name] is a 65 y.o. female who presents via EMS for altered mental status.  On examination, patient is unable to answer any questions that I have for her.  Patient continuously moaning but unable to answer questions.  I attempted to call her husband through the number found on the demographic form with no answer.  Per EMS, they were called to the patient's house yesterday because the patient slipped out of her wheelchair.  The husband stated this morning that the patient did the same thing last night and she just left on the floor.  Patient CBG with EMS was 551.  EMS states that the patient is significantly altered compared to yesterday when they interacted with her.  Patient is apparently noncompliant with her meds.  Patient husband states that she also has vaginal bleeding that was been evaluated and hysterectomy was recommended but patient would not consent to that.   Altered Mental Status     Past Medical History:  Diagnosis Date   Blind right eye    Chronic back pain    "my whole back" (03/18/2015)   Chronic chest pain    takes isosorbide   Chronic diastolic heart failure ([place]) 9/13   echo 03/20/15 LV Ef of 60-65%, grade 2DD and  PA peak pressure: 36 mm Hg    CKD (chronic kidney disease), stage III (HCC)    Coronary artery disease    Dr Sallyanne Kuster, cardiac cath 02-07-2009 -- diffuse 3V CAD involving RCA, CFx, and D1,  pt not a candidate for bypass surgery or angioplasty,  medical management   Diabetic neuropathy ([place])    Diabetic retinopathy ([place])    bilateral proliferative   Diastolic CHF, chronic ([place])    Dyspnea    "always"   Edema of both lower extremities    Generalized weakness    GERD (gastroesophageal reflux disease)    Glaucoma, both eyes    History of non-ST  elevation myocardial infarction (NSTEMI)    02-25-2009;  05-15-2009;  01-08-2010;  04-04-2012 this MI was post-op surgery for abscess on 04-03-2012   History of sepsis    Hyperlipemia    Hypertension    Insulin dependent type 2 diabetes mellitus ([place])    followed by pcp   Iron deficiency anemia due to chronic blood loss    uterine bleeding   Legally blind in left eye, as defined in [place]    per pt states vision "is foggy"   Mild obstructive sleep apnea    study in epic 04-19-2012 mild osa,  recommendation's were wt. loss, dental  appliance, surgery, or cpap   Morbid obesity with BMI of 40.0-44.9, adult ([place])    OA (osteoarthritis)    knees   PSVT (paroxysmal supraventricular tachycardia) ([place])    Renal insufficiency    pt. denies   Seasonal allergies    Wears glasses     Patient Active Problem List   Diagnosis Date Noted   Nausea & vomiting 12/01/2020   Ovarian mass, left 12/01/2020   Hypokalemia 12/01/2020   Leukocytosis 12/01/2020   Generalized weakness 11/30/2020   Acute respiratory failure with hypoxia ([place]) 01/24/2020   Chest pain 01/23/2020   Acute exacerbation of CHF (congestive heart failure) ([place]) 01/23/2020   Functional urinary incontinence 09/07/2019  Urge incontinence of urine 09/07/2019   Legally blind in right eye, as defined in [place] 08/11/2018   Bilateral carpal tunnel syndrome 04/13/2018   Adenomatous polyp of colon 04/13/2018   Fibrocystic breast changes, right 12/12/2017   Drug-induced constipation 09/15/2017   Primary osteoarthritis of both knees 04/05/2017   Chronic bilateral low back pain 03/07/2017   Chronic pain of right knee 03/07/2017   Vitamin B12 deficiency 12/09/2016   Incidental lung nodule, > 56m and < 896m02/13/2018   Vitreous hemorrhage of right eye ([facility]   Diabetic gastroparesis associated with type 2 diabetes mellitus ([facility]02/05/2016   Controlled type 2 diabetes mellitus with complication, with long-term current use of insulin ([facility] [place] 09/12/2015   Chronic renal insufficiency, stage III (moderate) (HCC) 03/19/2015   Prolonged Q-T interval on ECG 08/10/2013   Dyslipidemia 04/02/2013   Acute on chronic diastolic heart failure ([facility]09/01/2012   Presumed OSA (obstructive sleep apnea) 04/06/2012   NSTEMI  post-op 04/04/12-medical Rx 04/04/2012   Chronic diastolic heart failure ([facility]09/2013   Severe obesity (BMI >= 40) ([facility]06/17/2013   GERD 08/21/2009   Complex endometrial hyperplasia with atypia 03/20/2009   PERIPHERAL EDEMA 03/20/2009   Coronary atherosclerosis-not a surgical or PCI candidate 2010 02/27/2009   Proliferative diabetic retinopathy (HCF[facility]03/01/2008   DETACHED RETINA, BILATERAL, HX OF 09/07/2007   History of chronic Iron defeicency anemia with acute post op anemia, transfused after debridment 04/04/12 04/17/2007   Essential hypertension 04/17/2007    Past Surgical History:  Procedure Laterality Date   BIOPSY  02/13/2018   Procedure: BIOPSY;  Surgeon: StLadene ArtistMD;  Location: [facility];  Service: Endoscopy;;   BREAST SURGERY Right 2019    breast biopsy,  per pt benign   CARDIAC CATHETERIZATION  02-27-2009   dr al little   diffuse 3V CAD , involving RCA, CFx, and D1;  inferior wall abnormalities, low normal ef 50%   CATARACT EXTRACTION Right    CATARACT EXTRACTION W/ INTRAOCULAR LENS IMPLANT Left 10-11-2007   _0    COLONOSCOPY WITH PROPOFOL N/A 02/13/2018   Procedure: COLONOSCOPY WITH PROPOFOL;  Surgeon: StLadene ArtistMD;  Location: [facility];  Service: Endoscopy;  Laterality: N/A;   DILATION AND CURETTAGE OF UTERUS  x2 last one 2000   EYE SURGERY Bilateral 2009 to 2010   x2  left eye;  x2  right eye    HYSTEROSCOPY WITH D & C N/A 10/11/2018   Procedure: DILATATION AND CURETTAGE /HYSTEROSCOPY;  Surgeon: PrDonnamae JudeMD;  Location: [facility];  Service: Gynecology;  Laterality: N/A;   INCISION AND DRAINAGE PERIRECTAL ABSCESS  04/03/2012   INTRAUTERINE DEVICE (IUD) INSERTION  10/11/2018    Procedure: INTRAUTERINE DEVICE (IUD) INSERTION;  Surgeon: PrDonnamae JudeMD;  Location: [facility];  Service: Gynecology;;   IR [place] VASC ACCESS LEFT  02/13/2018   IR VENIPUNCTURE 27YRS/OLDER BY MD  02/13/2018   POLYPECTOMY  02/13/2018   Procedure: POLYPECTOMY;  Surgeon: StLadene ArtistMD;  Location: [facility];  Service: Endoscopy;;     OB History     Gravida  0   Para  0   Term  0   Preterm  0   AB  0   Living  0      SAB  0   IAB  0   Ectopic  0   Multiple  0   Live Births  0           Family  History  Problem Relation Age of Onset   Diabetes Mother    Heart disease Mother    Heart attack Mother    Hypertension Mother    Breast cancer Mother    Colon polyps Father    Diabetes Father    Heart disease Father    Heart attack Father    Stroke Father    Hypertension Father    Diabetes Brother    Diabetes Sister    Heart disease Brother    Heart disease Sister    Diabetes Maternal Grandmother    Hypertension Maternal Grandmother    Hypertension Brother    Hypertension Sister    Breast cancer Cousin    Colon cancer Cousin    Esophageal cancer Neg Hx    Rectal cancer Neg Hx    Stomach cancer Neg Hx     Social History   Tobacco Use   Smoking status: Never   Smokeless tobacco: Never  Vaping Use   Vaping Use: Never used  Substance Use Topics   Alcohol use: No   Drug use: Never    Home Medications Prior to Admission medications   Medication Sig Start Date End Date Taking? Authorizing Provider  Accu-Chek Softclix Lancets lancets Use as instructed for 3 times daily blood glucose monitoring 12/24/19   Ladell Pier, MD  acetaZOLAMIDE ER (DIAMOX) 500 MG capsule Take 1 capsule (500 mg total) by mouth 2 (two) times daily. 05/23/21   Ladell Pier, MD  aspirin 81 MG EC tablet Take 1 tablet (81 mg total) by mouth daily. 05/23/21   Ladell Pier, MD  atorvastatin (LIPITOR) 80 MG tablet TAKE 1 TABLET BY MOUTH ONCE DAILY IN THE EVENING AT  6  PM 05/23/21   Ladell Pier, MD  bismuth subsalicylate (PEPTO BISMOL) 262 MG/15ML suspension Take 30 mLs by mouth every 6 (six) hours as needed for indigestion.    [provider]  Blood Glucose Monitoring Suppl (ACCU-CHEK AVIVA PLUS) w/Device KIT Used as directed 09/29/18   Ladell Pier, MD  Blood Pressure Monitoring (BLOOD PRESSURE MONITOR/ARM) DEVI 1 each by Does not apply route daily. ICD 10, I10 and I50.32 03/07/17   Funches, Josalyn, MD  brimonidine-timolol (COMBIGAN) 0.2-0.5 % ophthalmic solution Place 1 drop into both eyes 2 (two) times daily. 04/14/21   Marianna Payment, MD  Cyanocobalamin (B-12) 1000 MCG TABS Take 1 tablet (1,000 mcg total) by mouth daily. 05/23/21   Ladell Pier, MD  diltiazem (CARDIZEM CD) 240 MG 24 hr capsule Take 1 capsule (240 mg total) by mouth daily. 05/23/21   Ladell Pier, MD  furosemide (LASIX) 40 MG tablet Take 1 tablet (40 mg total) by mouth 2 (two) times daily. 05/23/21   Ladell Pier, MD  glucose blood test strip Use TID before meals / Dx E11.9 04/14/21   Marianna Payment, MD  glucose monitoring kit (FREESTYLE) monitoring kit 1 each by Does not apply route 4 (four) times daily - after meals and at bedtime. 09/12/15   Funches, Adriana Mccallum, MD  insulin lispro protamine-lispro (HUMALOG MIX 75/25) (75-25) 100 UNIT/ML SUSP injection Inject 54 Units into the skin 2 (two) times daily with a meal. 05/23/21   Ladell Pier, MD  Insulin Syringe-Needle U-100 (SAFETY INSULIN SYRINGES) 30G X 1/2" 1 ML MISC Use as directed with insulin 05/23/21   Ladell Pier, MD  isosorbide mononitrate (IMDUR) 60 MG 24 hr tablet Take 2 tablets (120 mg total) by mouth daily. 05/23/21  Ladell Pier, MD  latanoprost (XALATAN) 0.005 % ophthalmic solution Place 1 drop into the right eye at bedtime. 04/14/21   Marianna Payment, MD  losartan (COZAAR) 100 MG tablet Take 1 tablet (100 mg total) by mouth daily. 05/23/21   Ladell Pier, MD  megestrol  (MEGACE) 40 MG tablet Take 1 tablet (40 mg total) by mouth 2 (two) times daily. 05/23/21   Ladell Pier, MD  Menthol-Methyl Salicylate (MUSCLE RUB) 10-15 % CREA Apply 1 application topically daily as needed (knee pain). 04/14/21   Marianna Payment, MD  metoprolol (TOPROL-XL) 200 MG 24 hr tablet Take 1 tablet (200 mg total) by mouth daily. . 05/23/21   Ladell Pier, MD  nitroGLYCERIN (NITROSTAT) 0.4 MG SL tablet DISSOLVE ONE TABLET UNDER THE TONGUE EVERY 5 MINUTES AS NEEDED FOR CHEST PAIN.  DO NOT EXCEED A TOTAL OF 3 DOSES IN 15 MINUTES 04/14/21   Marianna Payment, MD  pantoprazole (PROTONIX) 40 MG tablet Take 1 tablet (40 mg total) by mouth daily. 05/23/21   Ladell Pier, MD  potassium chloride SA (KLOR-CON) 20 MEQ tablet Take 2 tablets (40 mEq total) by mouth daily. with food 05/23/21   Ladell Pier, MD  RELION INSULIN SYRINGE 31G X 15/64" 1 ML MISC USE AS DIRECTED TWICE DAILY FOR INSULIN 04/30/21   Ladell Pier, MD  diltiazem (DILACOR XR) 240 MG 24 hr capsule Take 1 capsule (240 mg total) by mouth daily. 12/03/20 04/14/21  Shawna Clamp, MD    Allergies    Ativan [lorazepam] and Orange fruit [citrus]  Review of Systems   Review of Systems  Unable to perform ROS: Mental status change (LEVEL 5 CAVEAT)   Physical Exam Updated Vital Signs BP 120/89 (BP Location: Right Arm)   Pulse 74   Temp 97.6 F (36.4 C) (Oral)   Resp 17   Ht _0  (1.6 m)   Wt 117.9 kg   LMP 04/02/2012   SpO2 99%   BMI 46.06 kg/m   Physical Exam Vitals and nursing note reviewed.  Constitutional:      Appearance: She is obese.  Eyes:     Comments: Cataract to right eye   Cardiovascular:     Rate and Rhythm: Normal rate and regular rhythm.  Pulmonary:     Effort: Pulmonary effort is normal.     Breath sounds: Normal breath sounds.  Abdominal:     General: Abdomen is flat.     Palpations: Abdomen is soft.  Skin:    General: Skin is warm and dry.     Capillary Refill: Capillary refill  takes less than 2 seconds.  Neurological:     Mental Status: She is disoriented.    ED Results / Procedures / Treatments   Labs (all labs ordered are listed, but only abnormal results are displayed) Labs Reviewed  CBC WITH DIFFERENTIAL/PLATELET - Abnormal; Notable for the following components:      Result Value   WBC 10.6 (*)    Neutro Abs 8.3 (*)    All other components within normal limits  I-STAT VENOUS BLOOD GAS, ED - Abnormal; Notable for the following components:   Sodium 130 (*)    Potassium 6.7 (*)    Calcium, Ion 1.03 (*)    All other components within normal limits  I-STAT CHEM 8, ED - Abnormal; Notable for the following components:   Sodium 129 (*)    Potassium 6.7 (*)    Chloride 97 (*)  BUN 36 (*)    Creatinine, Ser 1.90 (*)    Glucose, Bld 484 (*)    Calcium, Ion 1.03 (*)    All other components within normal limits  CBG MONITORING, ED - Abnormal; Notable for the following components:   Glucose-Capillary 483 (*)    All other components within normal limits  RESP PANEL BY RT-PCR (FLU A&B, COVID) ARPGX2  BASIC METABOLIC PANEL  URINALYSIS, ROUTINE W REFLEX MICROSCOPIC  AMMONIA  BETA-HYDROXYBUTYRIC ACID  TSH  BRAIN NATRIURETIC PEPTIDE  HEPATIC FUNCTION PANEL  TROPONIN I (HIGH SENSITIVITY)  TROPONIN I (HIGH SENSITIVITY)    EKG EKG Interpretation  Date/Time:  Friday July 10 2021 15:33:19 EST Ventricular Rate:  75 PR Interval:  153 QRS Duration: 158 QT Interval:  455 QTC Calculation: 509 R Axis:   -22 Text Interpretation: Sinus rhythm Ventricular premature complex No sig change from prior tracing Sept 11 2022 Confirmed by Octaviano Glow [phone]) on 07/10/2021 3:52:38 PM  Radiology CT Head Wo Contrast  Result Date: 07/10/2021 CLINICAL DATA:  Altered mental status, delirium EXAM: CT HEAD WITHOUT CONTRAST TECHNIQUE: Contiguous axial images were obtained from the base of the skull through the vertex without intravenous contrast. COMPARISON:   12/07/2020 FINDINGS: Brain: No evidence of acute infarction, hemorrhage, cerebral edema, mass, mass effect, or midline shift. No hydrocephalus or extra-axial fluid collection. Mild generalized atrophy. Periventricular white matter changes, likely the sequela of chronic small vessel ischemic disease. Vascular: No hyperdense vessel. Skull: Normal. Negative for fracture or focal lesion. Sinuses/Orbits: No acute finding. Other: The mastoid air cells are well aerated. IMPRESSION: IMPRESSION No acute intracranial process. No etiology is seen for the patient's altered mental status. Electronically Signed   By: Merilyn Baba M.D.   On: 07/10/2021 18:59   DG Chest Portable 1 View  Result Date: 07/10/2021 CLINICAL DATA:  Altered mental status. EXAM: PORTABLE CHEST 1 VIEW COMPARISON:  Chest x-ray 04/12/2021. FINDINGS: Cardiac silhouette is enlarged, unchanged. Aorta is tortuous. There is no focal lung infiltrate, pleural effusion or pneumothorax. No acute fractures are seen. IMPRESSION: 1. No acute cardiopulmonary process. 2. Stable cardiomegaly. Electronically Signed   By: Ronney Asters M.D.   On: 07/10/2021 17:09    Procedures .Critical Care E&M Performed by: Azucena Cecil, PA-C  Critical care provider statement:    Critical care time (minutes):  45   Critical care was necessary to treat or prevent imminent or life-threatening deterioration of the following conditions:  Renal failure and metabolic crisis   Critical care was time spent personally by me on the following activities:  Blood draw for specimens, ordering and performing treatments and interventions, ordering and review of laboratory studies, pulse oximetry, re-evaluation of patient's condition, review of old charts, examination of patient, discussions with consultants, development of treatment plan with patient or surrogate and evaluation of patient's response to treatment   I assumed direction of critical care for this patient from another  provider in my specialty: no     Care discussed with: admitting provider   After initial E/M assessment, critical care services were subsequently performed that were exclusive of separately billable procedures or treatment.     Medications Ordered in ED Medications  sodium chloride 0.9 % bolus 500 mL (has no administration in time range)  calcium gluconate 1 g/ 50 mL sodium chloride IVPB (has no administration in time range)  insulin aspart (novoLOG) injection 10 Units (has no administration in time range)  albuterol (PROVENTIL) (2.5 MG/3ML) 0.083% nebulizer solution 10 mg (has  no administration in time range)  sodium chloride 0.9 % bolus 250 mL (250 mLs Intravenous New Bag/Given 07/10/21 1910)    ED Course  I have reviewed the triage vital signs and the nursing notes.  Pertinent labs & imaging results that were available during my care of the patient were reviewed by me and considered in my medical decision making (see chart for details).  Clinical Course as of 07/10/21 1910  Fri Jul 10, 2021  1640 Urinalysis, Routine w reflex microscopic [CG]    Clinical Course User Index [CG] Lawana Chambers   MDM Rules/Calculators/A&P                          65 year old female presents via EMS to ED for complaint of altered mental status.  Per EMS, patient was alert and oriented day prior when they were called out to her residence when she slipped out of her wheelchair and needed help back in.  Patient's husband reports that patient fell out of wheelchair a second time after EMS left and he left her on the floor to sleep.  On contact today, EMS reports patient much more altered.  Patient blood glucose with EMS was in the 500s.  Patient fingerstick blood glucose here is 483.  On examination, patient continuously moaning unable to answer questions.   We will evaluate this patient with the following labs and images: Troponin, TSH, beta hydroxybutyrate acid, ammonia, BMP, BNP, i-STAT  Chem-8, VBG, urinalysis, CBC, chest x-ray, CT head without contrast.   Results of I-STAT Chem 8 shows patient is hyperkalemic with an AKI.  We will treat accordingly.  At the time end of shift, patient lab work still largely on resulted.  This patient will be handed off to Dr. Sherry Ruffing who will assume her care. Anticipate admission for altered mental status and significant electrolyte abnormalities.     Final Clinical Impression(s) / ED Diagnoses Final diagnoses:  Altered mental status, unspecified altered mental status type  Hyperkalemia    Rx / DC Orders ED Discharge Orders     None        Azucena Cecil, PA-C 07/10/21 1904    Azucena Cecil, PA-C 07/10/21 1910    Tegeler, Gwenyth Allegra, MD 07/11/21 0010

## 2021-07-10 NOTE — ED Notes (Signed)
Patient transported to X-ray 

## 2021-07-11 DIAGNOSIS — I5032 Chronic diastolic (congestive) heart failure: Secondary | ICD-10-CM | POA: Diagnosis present

## 2021-07-11 DIAGNOSIS — I130 Hypertensive heart and chronic kidney disease with heart failure and stage 1 through stage 4 chronic kidney disease, or unspecified chronic kidney disease: Secondary | ICD-10-CM | POA: Diagnosis present

## 2021-07-11 DIAGNOSIS — R4182 Altered mental status, unspecified: Secondary | ICD-10-CM

## 2021-07-11 DIAGNOSIS — E876 Hypokalemia: Secondary | ICD-10-CM

## 2021-07-11 DIAGNOSIS — N179 Acute kidney failure, unspecified: Secondary | ICD-10-CM

## 2021-07-11 DIAGNOSIS — E1100 Type 2 diabetes mellitus with hyperosmolarity without nonketotic hyperglycemic-hyperosmolar coma (NKHHC): Secondary | ICD-10-CM | POA: Diagnosis not present

## 2021-07-11 DIAGNOSIS — E1140 Type 2 diabetes mellitus with diabetic neuropathy, unspecified: Secondary | ICD-10-CM | POA: Diagnosis present

## 2021-07-11 DIAGNOSIS — I252 Old myocardial infarction: Secondary | ICD-10-CM | POA: Diagnosis not present

## 2021-07-11 DIAGNOSIS — E440 Moderate protein-calorie malnutrition: Secondary | ICD-10-CM | POA: Diagnosis present

## 2021-07-11 DIAGNOSIS — G9341 Metabolic encephalopathy: Secondary | ICD-10-CM | POA: Diagnosis not present

## 2021-07-11 DIAGNOSIS — H548 Legal blindness, as defined in USA: Secondary | ICD-10-CM | POA: Diagnosis present

## 2021-07-11 DIAGNOSIS — N189 Chronic kidney disease, unspecified: Secondary | ICD-10-CM | POA: Diagnosis not present

## 2021-07-11 DIAGNOSIS — E860 Dehydration: Secondary | ICD-10-CM | POA: Diagnosis not present

## 2021-07-11 DIAGNOSIS — Z20822 Contact with and (suspected) exposure to covid-19: Secondary | ICD-10-CM | POA: Diagnosis present

## 2021-07-11 DIAGNOSIS — E1110 Type 2 diabetes mellitus with ketoacidosis without coma: Secondary | ICD-10-CM | POA: Diagnosis present

## 2021-07-11 DIAGNOSIS — E46 Unspecified protein-calorie malnutrition: Secondary | ICD-10-CM

## 2021-07-11 DIAGNOSIS — W19XXXA Unspecified fall, initial encounter: Secondary | ICD-10-CM | POA: Diagnosis present

## 2021-07-11 DIAGNOSIS — R400 Somnolence: Secondary | ICD-10-CM | POA: Diagnosis not present

## 2021-07-11 DIAGNOSIS — N184 Chronic kidney disease, stage 4 (severe): Secondary | ICD-10-CM | POA: Diagnosis present

## 2021-07-11 DIAGNOSIS — X58XXXA Exposure to other specified factors, initial encounter: Secondary | ICD-10-CM | POA: Diagnosis present

## 2021-07-11 DIAGNOSIS — Z6841 Body Mass Index (BMI) 40.0 and over, adult: Secondary | ICD-10-CM | POA: Diagnosis not present

## 2021-07-11 DIAGNOSIS — E11649 Type 2 diabetes mellitus with hypoglycemia without coma: Secondary | ICD-10-CM | POA: Diagnosis not present

## 2021-07-11 DIAGNOSIS — R9431 Abnormal electrocardiogram [ECG] [EKG]: Secondary | ICD-10-CM

## 2021-07-11 DIAGNOSIS — G4733 Obstructive sleep apnea (adult) (pediatric): Secondary | ICD-10-CM | POA: Diagnosis present

## 2021-07-11 DIAGNOSIS — D500 Iron deficiency anemia secondary to blood loss (chronic): Secondary | ICD-10-CM | POA: Diagnosis present

## 2021-07-11 DIAGNOSIS — E1122 Type 2 diabetes mellitus with diabetic chronic kidney disease: Secondary | ICD-10-CM | POA: Diagnosis present

## 2021-07-11 DIAGNOSIS — Z9114 Patient's other noncompliance with medication regimen: Secondary | ICD-10-CM

## 2021-07-11 DIAGNOSIS — E8729 Other acidosis: Secondary | ICD-10-CM

## 2021-07-11 DIAGNOSIS — E8809 Other disorders of plasma-protein metabolism, not elsewhere classified: Secondary | ICD-10-CM

## 2021-07-11 DIAGNOSIS — I2510 Atherosclerotic heart disease of native coronary artery without angina pectoris: Secondary | ICD-10-CM | POA: Diagnosis present

## 2021-07-11 DIAGNOSIS — R7989 Other specified abnormal findings of blood chemistry: Secondary | ICD-10-CM

## 2021-07-11 DIAGNOSIS — H409 Unspecified glaucoma: Secondary | ICD-10-CM | POA: Diagnosis present

## 2021-07-11 DIAGNOSIS — E782 Mixed hyperlipidemia: Secondary | ICD-10-CM | POA: Diagnosis present

## 2021-07-11 DIAGNOSIS — E875 Hyperkalemia: Secondary | ICD-10-CM | POA: Diagnosis present

## 2021-07-11 LAB — COMPREHENSIVE METABOLIC PANEL
ALT: 11 U/L (ref 0–44)
AST: 19 U/L (ref 15–41)
Albumin: 2.6 g/dL — ABNORMAL LOW (ref 3.5–5.0)
Alkaline Phosphatase: 56 U/L (ref 38–126)
Anion gap: 10 (ref 5–15)
BUN: 21 mg/dL (ref 8–23)
CO2: 24 mmol/L (ref 22–32)
Calcium: 9.1 mg/dL (ref 8.9–10.3)
Chloride: 101 mmol/L (ref 98–111)
Creatinine, Ser: 1.46 mg/dL — ABNORMAL HIGH (ref 0.44–1.00)
GFR, Estimated: 39 mL/min — ABNORMAL LOW (ref >60)
Glucose, Bld: 202 mg/dL — ABNORMAL HIGH (ref 70–99)
Potassium: 3 mmol/L — ABNORMAL LOW (ref 3.5–5.1)
Sodium: 135 mmol/L (ref 135–145)
Total Bilirubin: 0.9 mg/dL (ref 0.3–1.2)
Total Protein: 6.7 g/dL (ref 6.5–8.1)

## 2021-07-11 LAB — GLUCOSE, CAPILLARY
Glucose-Capillary: 157 mg/dL — ABNORMAL HIGH (ref 70–99)
Glucose-Capillary: 172 mg/dL — ABNORMAL HIGH (ref 70–99)
Glucose-Capillary: 184 mg/dL — ABNORMAL HIGH (ref 70–99)
Glucose-Capillary: 186 mg/dL — ABNORMAL HIGH (ref 70–99)
Glucose-Capillary: 189 mg/dL — ABNORMAL HIGH (ref 70–99)
Glucose-Capillary: 197 mg/dL — ABNORMAL HIGH (ref 70–99)
Glucose-Capillary: 198 mg/dL — ABNORMAL HIGH (ref 70–99)
Glucose-Capillary: 208 mg/dL — ABNORMAL HIGH (ref 70–99)
Glucose-Capillary: 233 mg/dL — ABNORMAL HIGH (ref 70–99)

## 2021-07-11 LAB — BASIC METABOLIC PANEL
Anion gap: 10 (ref 5–15)
BUN: 19 mg/dL (ref 8–23)
CO2: 23 mmol/L (ref 22–32)
Calcium: 9.1 mg/dL (ref 8.9–10.3)
Chloride: 102 mmol/L (ref 98–111)
Creatinine, Ser: 1.28 mg/dL — ABNORMAL HIGH (ref 0.44–1.00)
GFR, Estimated: 46 mL/min — ABNORMAL LOW (ref >60)
Glucose, Bld: 167 mg/dL — ABNORMAL HIGH (ref 70–99)
Potassium: 3 mmol/L — ABNORMAL LOW (ref 3.5–5.1)
Sodium: 135 mmol/L (ref 135–145)

## 2021-07-11 LAB — MAGNESIUM: Magnesium: 2.1 mg/dL (ref 1.7–2.4)

## 2021-07-11 LAB — CBC
HCT: 42.8 % (ref 36.0–46.0)
Hemoglobin: 14.3 g/dL (ref 12.0–15.0)
MCH: 30.3 pg (ref 26.0–34.0)
MCHC: 33.4 g/dL (ref 30.0–36.0)
MCV: 90.7 fL (ref 80.0–100.0)
Platelets: 229 10*3/uL (ref 150–400)
RBC: 4.72 MIL/uL (ref 3.87–5.11)
RDW: 13 % (ref 11.5–15.5)
WBC: 11.5 10*3/uL — ABNORMAL HIGH (ref 4.0–10.5)
nRBC: 0 % (ref 0.0–0.2)

## 2021-07-11 LAB — CBG MONITORING, ED
Glucose-Capillary: 187 mg/dL — ABNORMAL HIGH (ref 70–99)
Glucose-Capillary: 189 mg/dL — ABNORMAL HIGH (ref 70–99)
Glucose-Capillary: 195 mg/dL — ABNORMAL HIGH (ref 70–99)

## 2021-07-11 LAB — HEMOGLOBIN A1C
Hgb A1c MFr Bld: 12.2 % — ABNORMAL HIGH (ref 4.8–5.6)
Mean Plasma Glucose: 303.44 mg/dL

## 2021-07-11 LAB — OSMOLALITY: Osmolality: 301 mOsm/kg — ABNORMAL HIGH (ref 275–295)

## 2021-07-11 LAB — PHOSPHORUS: Phosphorus: 2.4 mg/dL — ABNORMAL LOW (ref 2.5–4.6)

## 2021-07-11 LAB — T4, FREE: Free T4: 1.39 ng/dL — ABNORMAL HIGH (ref 0.61–1.12)

## 2021-07-11 MED ORDER — INSULIN GLARGINE-YFGN 100 UNIT/ML ~~LOC~~ SOLN
20.0000 [IU] | Freq: Every day | SUBCUTANEOUS | Status: DC
  Administered 2021-07-11: 20 [IU] via SUBCUTANEOUS
  Filled 2021-07-11 (×2): qty 0.2

## 2021-07-11 MED ORDER — SODIUM CHLORIDE 0.9 % IV SOLN
2.0000 g | INTRAVENOUS | Status: AC
  Administered 2021-07-11 – 2021-07-13 (×3): 2 g via INTRAVENOUS
  Filled 2021-07-11 (×3): qty 20

## 2021-07-11 MED ORDER — INSULIN ASPART 100 UNIT/ML IJ SOLN
0.0000 [IU] | INTRAMUSCULAR | Status: DC
  Administered 2021-07-11: 3 [IU] via SUBCUTANEOUS
  Administered 2021-07-12 (×2): 5 [IU] via SUBCUTANEOUS

## 2021-07-11 MED ORDER — KCL-LACTATED RINGERS-D5W 20 MEQ/L IV SOLN
INTRAVENOUS | Status: DC
  Filled 2021-07-11 (×2): qty 1000

## 2021-07-11 MED ORDER — HYDRALAZINE HCL 20 MG/ML IJ SOLN: 10.0000 mg | Freq: Four times a day (QID) | INTRAMUSCULAR | Status: DC | PRN

## 2021-07-11 MED ORDER — CEFTRIAXONE SODIUM 2 G IJ SOLR
2.0000 g | INTRAMUSCULAR | Status: DC
Start: 2021-07-11 — End: 2021-07-11

## 2021-07-11 MED ORDER — PANTOPRAZOLE SODIUM 40 MG IV SOLR
40.0000 mg | INTRAVENOUS | Status: DC
  Administered 2021-07-11 – 2021-07-13 (×3): 40 mg via INTRAVENOUS
  Filled 2021-07-11 (×2): qty 40

## 2021-07-11 NOTE — Progress Notes (Signed)
Received pt. From ED. BS-197 with insulin gtt @1 .8cc/hr per endotool. CHG bath given with CCMD aware of pt.placement. NSR with PVCs noted. No breakdown on pt. Buttock, Purewick replaced and hooked to suction. Pt. Will open eyes to voice but will not follow commands. Orders given and carried out. Will continue to monitor

## 2021-07-11 NOTE — Evaluation (Signed)
Physical Therapy Evaluation Patient Details Name: [name] MRN: [id number] DOB: [birth year] Today's Date: 07/11/2021  History of Present Illness  Pt is a 65 y.o. female admitted 12/9 after a fall from her wheelchair and with AMS. CBG elevated at 551, has not been compliant with medications. PMH:  HFpEF, hypertension, obesity, hyperlipidemia, T2DM, glaucoma, GERD, CAD, CKD   Clinical Impression  Pt in bed upon arrival of PT, agreeable to evaluation at this time. The pt was only able to give limited information regarding PLOF, but per chart, the pt fell from [facility] P[facility], and the [place] does not fit through all doorways in the pt's home, therefore it is likely she performs standing or transfers in addition to [facility] mobility at baseline. The pt now presents with limitations in functional mobility, strength, muscular endurance, activity tolerance, and coordination of movements which are all further impacted by impaired cognition and poor command following. The pt was inconsistent with command following through the session, but was able to demo some ROM in BLE against gravity, but demos poor strength to complete movements or muscular endurance to maintain positions for more than a few seconds at a time. The pt requires totalA of 2 to complete functional rolling in bed at this time due to poor ability to assist or follow commands to complete movements. Given significant level of assist needed, would recommend SNF rehab at d/c to facilitate improvements and decrease caregiver burden prior to return home.          Recommendations for follow up therapy are one component of a multi-disciplinary discharge planning process, led by the attending physician.  Recommendations may be updated based on patient status, additional functional criteria and insurance authorization.  Follow Up Recommendations Skilled nursing-short term rehab (<3 hours/day)    Assistance Recommended at Discharge Frequent or constant  Supervision/Assistance  Functional Status Assessment Patient has had a recent decline in their functional status and demonstrates the ability to make significant improvements in function in a reasonable and predictable amount of time.  Equipment Recommendations   (would need lift and hospital bed to return home, as well as +2 assist)    Recommendations for Other Services       Precautions / Restrictions Precautions Precautions: Fall;Other (comment) Precaution Comments: impaired vision Restrictions Weight Bearing Restrictions: No      Mobility  Bed Mobility Overal bed mobility: Needs Assistance Bed Mobility: Rolling Rolling: Total assist;+2 for physical assistance;+2 for safety/equipment         General bed mobility comments: pt needing max hand-over-hand assist to complete cross-body reaching, but unable to maintain grip or reach when cued to assist with rolling. totalA of 2 for rolling in bed and cleaning. (pt soiled of urine upon arrival)    Transfers                   General transfer comment: deferred, will need lift           Pertinent Vitals/Pain Pain Assessment: Faces Faces Pain Scale: Hurts a little bit Pain Location: grimacing with initial movement of RUE, but not able to verbalize any pain Pain Descriptors / Indicators: Discomfort;Grimacing Pain Intervention(s): Limited activity within patient's tolerance;Monitored during session;Repositioned    Home Living Family/patient expects to be discharged to:: Private residence Living Arrangements: Spouse/significant other Available Help at Discharge: Family;Available 24 hours/day Type of Home: Apartment Home Access: Stairs to enter Entrance Stairs-Rails: None Entrance Stairs-Number of Steps: 2-3 from parking area to door   Home Layout: One  level Home Equipment: Conservation officer, nature (2 wheels);Wheelchair - manual;Cane - single point;Shower seat Additional Comments: doors in house too narrow for w/c and  walker    Prior Function Prior Level of Function : Needs assist;Patient poor historian/Family not available       Physical Assist : ADLs (physical);Mobility (physical) Mobility (physical): Bed mobility;Transfers;Gait ADLs (physical): Bathing;Dressing;IADLs;Toileting Mobility Comments: Per chart, pt fell from wheelchair leading to this admission. pt reports using wheelchair for mobility in the home but per previous admission, w/c does not fit in doorways at home. Per chart, husband assists with bed mobility and standing as needed. Pt will use walker for mobility in the home with furniture walking in/out of bathroom. Frequent falls and unable to get up without multiple people to assist ADLs Comments: Pt reports husband assists with LB bathing and dressing. Per chart, pt uses depends at home. Husband does IADls and drives     Hand Dominance   Dominant Hand: Right    Extremity/Trunk Assessment   Upper Extremity Assessment Upper Extremity Assessment: Defer to OT evaluation;Difficult to assess due to impaired cognition    Lower Extremity Assessment Lower Extremity Assessment: Generalized weakness;Difficult to assess due to impaired cognition (pt able to move LE against gravity with minA, not able to follow commands for MMT)    Cervical / Trunk Assessment Cervical / Trunk Assessment: Other exceptions Cervical / Trunk Exceptions: large body habitus  Communication   Communication: Expressive difficulties;Other (comment) (repetitive)  Cognition Arousal/Alertness: Awake/alert;Lethargic Behavior During Therapy: Flat affect Overall Cognitive Status: No family/caregiver present to determine baseline cognitive functioning Area of Impairment: Orientation;Attention;Memory;Following commands;Safety/judgement;Awareness;Problem solving                 Orientation Level: Disoriented to;Place;Time;Situation Current Attention Level: Focused Memory: Decreased short-term memory Following  Commands: Follows one step commands inconsistently Safety/Judgement: Decreased awareness of safety;Decreased awareness of deficits Awareness: Intellectual Problem Solving: Slow processing;Decreased initiation;Requires verbal cues;Difficulty sequencing;Requires tactile cues General Comments: pt able to state her name, otherwise answering questions inconsistently or after being asked repetitively. pt with inconsistent ability to follow simple commands, improved slightly through the session.        General Comments General comments (skin integrity, edema, etc.): HR in 70s, BP stable. SpO2 98% on RA        Assessment/Plan    PT Assessment Patient needs continued PT services  PT Problem List Decreased range of motion;Decreased strength;Decreased activity tolerance;Decreased balance;Decreased mobility;Decreased coordination;Decreased cognition;Decreased safety awareness       PT Treatment Interventions DME instruction;Gait training;Stair training;Functional mobility training;Therapeutic activities;Therapeutic exercise;Balance training;Cognitive remediation;Patient/family education    PT Goals (Current goals can be found in the Care Plan section)  Acute Rehab PT Goals Patient Stated Goal: none stated, pt with limited verbalizations PT Goal Formulation: Patient unable to participate in goal setting Time For Goal Achievement: 07/25/21 Potential to Achieve Goals: Fair    Frequency Min 2X/week   Barriers to discharge   pt reports from home with spouse, would need +2 assist for any movements at this time       AM-PAC PT "6 Clicks" Mobility  Outcome Measure Help needed turning from your back to your side while in a flat bed without using bedrails?: Total Help needed moving from lying on your back to sitting on the side of a flat bed without using bedrails?: Total Help needed moving to and from a bed to a chair (including a wheelchair)?: Total Help needed standing up from a chair using  your arms (e.g., wheelchair  or bedside chair)?: Total Help needed to walk in hospital room?: Total Help needed climbing 3-5 steps with a railing? : Total 6 Click Score: 6    End of Session   Activity Tolerance: Patient limited by lethargy Patient left: in bed;with call bell/phone within reach;with bed alarm set;with nursing/sitter in room Nurse Communication: Mobility status;Need for lift equipment PT Visit Diagnosis: Other abnormalities of gait and mobility (R26.89);Muscle weakness (generalized) (M62.81)    Time: 9093-1121 PT Time Calculation (min) (ACUTE ONLY): 34 min   Charges:   PT Evaluation $PT Eval Moderate Complexity: 1 Mod PT Treatments $Therapeutic Activity: 8-22 mins        West Carbo, PT, DPT   Acute Rehabilitation Department Pager #: [phone]  Sandra Cockayne 07/11/2021, 8:52 AM

## 2021-07-11 NOTE — Progress Notes (Signed)
Inpatient Diabetes Program Recommendations  AACE/ADA: New Consensus Statement on Inpatient Glycemic Control (2015)  Target Ranges:  Prepandial:   less than 140 mg/dL      Peak postprandial:   less than 180 mg/dL (1-2 hours)      Critically ill patients:  140 - 180 mg/dL   Lab Results  Component Value Date   GLUCAP 157 (H) 07/11/2021   HGBA1C 12.2 (H) 07/11/2021    Review of Glycemic Control  Diabetes history: type 2 Outpatient Diabetes medications: Humalog 75/25 insulin 54 units BID Current orders for Inpatient glycemic control: IV insulin drip  Inpatient Diabetes Program Recommendations:   Noted that patient was last admitted to hospital on 9/11-9/13, 2022.   When patient ready to transition off IV insulin,  recommend starting Semglee 25 units BID, Novolog MODERATE correction scale TID & 0-5 units HS scale, and (when eating) Novolog 3 units TID with meals if eating at least 50% of meal. Titrate dosages as needed.   Harvel Ricks RN BSN CDE Diabetes Coordinator Pager: [phone]  8am-5pm

## 2021-07-11 NOTE — Progress Notes (Signed)
PROGRESS NOTE  [name]  ZHY:[id number] DOB: 04-11-1956 DOA: 07/10/2021 PCP: Ladell Pier, MD   Brief Narrative: [name] is a 65 y.o. female with a history of morbid obesity, T2DM, CAD, HFpEF, HTN, HLD, CKD IIIb who was brought to the ED 12/9 by EMS from home due to recurrently slipping out of her wheelchair found to be severely hyperglycemic and progressively confused/somnolent. Hyperglycemia, hyperkalemia were noted for which IV fluids and IV insulin were started   Assessment & Plan: Principal Problem:   Hyperosmolar hyperglycemic state (HHS) ([place]) Active Problems:   Prolonged Q-T interval on ECG   Hypokalemia   High anion gap metabolic acidosis   Dehydration   Pseudohyponatremia   Acute kidney injury superimposed on CKD (HCC)   Hypoalbuminemia due to protein-calorie malnutrition (HCC)   Low TSH level   Noncompliance with medication regimen   Altered mental status  HHS and mild DKA in IDT2DM with diabetic neuropathy, retinopathy, nephropathy: Decompensation reportedly due to nonadherence to medications, though chronically uncontrolled as well with HbA1c 12.2% (avg CBG ~300mg /dl). - Continue IV insulin until repeat BMP, then start basal-bolus at doses near home dosing. - Home 75/25 insulin reportedly 54u BID -> Once able, would start glargine 30u BID (~25% long-acting reduction) and 3u novolog TIDWC + mod SSI due to unclear ability to take po (home dose gives 27u short-acting daily if she takes it).   Chronic HFpEF, HTN: Intravascularly dry. - Continue IVF for now. - Home med list reviewed, unable to confirm w/pt at this time. Including: Acetazolamide, lasix 40mg  po BID, diltiazem 240mg  daily, metoprolol 200mg  daily, losartan 100mg  daily. Holding these for now.  CAD: No chest pain reported, no ST elevations. Diffuse 3VD (RCA, LCx, D1) by LHC remotely - Once able to take po, restart ASA, statin, BB, imdur 120mg  daily  AKI on stage IIIb CKD: SCr relatively  stable - Repeat BMP pending from this AM.   Iron deficiency anemia due to chronic blood loss due to uterine bleeding: Hysterectomy reportedly recommended. Hgb currently normal. - Megace on med list.  - Recheck vitamin B12 (on med list but reported nonadherence)  Weakness, falls:  - PT/OT consulted. SNF recommended.  - Optimize medically prior to discharge  Acute metabolic encephalopathy: Due to HHS. Negative head CT. - Delirium precautions.  - With significant pyuria and bacteriuria, otherwise unexplained leukocytosis, and inability to really report urinary symptoms, we will check urine culture and empirically put on ceftriaxone.  Suppressed TSH: Also had low level earlier this year during an admission.  - Check free T4. Recommend rechecking outside scope of acute illness.   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 Denman George for this and bleeding  Morbid obesity: Estimated body mass index is 46.06 kg/m as calculated from the following:   Height as of this encounter: 5\' 3"  (1.6 m).   Weight as of this encounter: 117.9 kg.  Moderate protein calorie malnutrition:  - Supplement protein as able  Glaucoma: With retinopathy leads to very poor visual acuity.  - Continue gtt's  HLD:  - Continue statin when taking po  GERD:  - Continue PPI when taking po  OSA: Per chart review diagnosed in 2013, not on CPAP.   Prolonged QT interval:  - Continue cardiac monitoring and optimizing electrolytes.   Hypokalemia: Supplemented.   DVT prophylaxis: Lovenox 0.5mg /kg q24h since no active bleeding noted Code Status: Full Family Communication: None at bedside Disposition Plan:  Status is: Observation  The patient will require care spanning > 2 midnights and should be moved to inpatient because: Continues AMS and HHS/DKA treatment  Consultants:  None  Procedures:  None  Antimicrobials: Ceftriaxone 12/10 - 12/12   Subjective: Pt somnolent but responsive to  most questions with brief answers. Oriented. No pain or other complaints at this time.  Objective: Vitals:   07/11/21 0200 07/11/21 0215 07/11/21 0322 07/11/21 0806  BP: (!) 142/77 (!) 144/113 (!) 167/92 (!) 129/92  Pulse:  77 79 78  Resp:  (!) 28 19 18   Temp:   98.3 F (36.8 C) 98 F (36.7 C)  TempSrc:   Oral Oral  SpO2:  97% 96% 96%  Weight:      Height:        Intake/Output Summary (Last 24 hours) at 07/11/2021 1011 Last data filed at 07/10/2021 2236 Gross per 24 hour  Intake --  Output 1200 ml  Net -1200 ml   Filed Weights   07/10/21 1513  Weight: 117.9 kg   Gen: 65 y.o. female in no distress  Pulm: Non-labored breathing room air. Clear to auscultation bilaterally.  CV: Regular rate and rhythm. No murmur, rub, or gallop. UTD JVD, no pitting pedal edema. GI: Abdomen soft, obese, non-tender, non-distended, with normoactive bowel sounds. No organomegaly or masses felt. Ext: Warm, dry no deformities Skin: No rashes, lesions or ulcers on visualized skin Neuro: Rousable, no dysarthria, oriented. No focal neurological deficits though pt is diffusely quite weak and not cooperative with full exam. Psych: Judgement and insight appear fair. Calm  Data Reviewed: I have personally reviewed following labs and imaging studies  CBC: Recent Labs  Lab 07/10/21 1740 07/10/21 1837 07/11/21 0400  WBC 10.6*  --  11.5*  NEUTROABS 8.3*  --   --   HGB 14.4 13.9  14.3 14.3  HCT 43.9 41.0  42.0 42.8  MCV 94.0  --  90.7  PLT 237  --  782   Basic Metabolic Panel: Recent Labs  Lab 07/10/21 1740 07/10/21 1837 07/11/21 0400  NA 131* 130*  129* 135  K 3.4* 6.7*  6.7* 3.0*  CL 96* 97* 101  CO2 20*  --  24  GLUCOSE 487* 484* 202*  BUN 28* 36* 21  CREATININE 1.95* 1.90* 1.46*  CALCIUM 9.2  --  9.1  MG  --   --  2.1  PHOS  --   --  2.4*   GFR: Estimated Creatinine Clearance: 47 mL/min (A) (by C-G formula based on SCr of 1.46 mg/dL (H)). Liver Function Tests: Recent Labs   Lab 07/10/21 2000 07/11/21 0400  AST 12* 19  ALT 9 11  ALKPHOS 45 56  BILITOT 0.7 0.9  PROT 4.8* 6.7  ALBUMIN 1.9* 2.6*   No results for input(s): LIPASE, AMYLASE in the last 168 hours. Recent Labs  Lab 07/10/21 1740  AMMONIA 32   Coagulation Profile: No results for input(s): INR, PROTIME in the last 168 hours. Cardiac Enzymes: Recent Labs  Lab 07/10/21 1923  CKTOTAL 72   BNP (last 3 results) No results for input(s): PROBNP in the last 8760 hours. HbA1C: Recent Labs    07/11/21 0400  HGBA1C 12.2*   CBG: Recent Labs  Lab 07/11/21 0434 07/11/21 0535 07/11/21 0629 07/11/21 0731 07/11/21 0836  GLUCAP 233* 186* 189* 172* 157*   Lipid Profile: No results for input(s): CHOL, HDL, LDLCALC, TRIG, CHOLHDL, LDLDIRECT in the last 72 hours. Thyroid Function Tests: Recent Labs    07/10/21 1834 07/11/21  0416  TSH 0.235*  --   FREET4  --  1.39*   Anemia Panel: No results for input(s): VITAMINB12, FOLATE, FERRITIN, TIBC, IRON, RETICCTPCT in the last 72 hours. Urine analysis:    Component Value Date/Time   COLORURINE YELLOW 07/10/2021 2014   APPEARANCEUR HAZY (A) 07/10/2021 2014   LABSPEC 1.022 07/10/2021 2014   PHURINE 5.0 07/10/2021 2014   GLUCOSEU >=500 (A) 07/10/2021 2014   HGBUR LARGE (A) 07/10/2021 2014   HGBUR large 04/03/2010 [place] NEGATIVE 07/10/2021 2014   KETONESUR 5 (A) 07/10/2021 2014   PROTEINUR 30 (A) 07/10/2021 2014   UROBILINOGEN 0.2 03/22/2013 1159   NITRITE NEGATIVE 07/10/2021 2014   LEUKOCYTESUR MODERATE (A) 07/10/2021 2014   Recent Results (from the past 240 hour(s))  Resp Panel by RT-PCR (Flu A&B, Covid) Nasopharyngeal Swab     Status: None   Collection Time: 07/10/21  3:22 PM   Specimen: Nasopharyngeal Swab; Nasopharyngeal(NP) swabs in vial transport medium  Result Value Ref Range Status   SARS Coronavirus 2 by RT PCR NEGATIVE NEGATIVE Final    Comment: (NOTE) SARS-CoV-2 target nucleic acids are NOT DETECTED.  The  SARS-CoV-2 RNA is generally detectable in upper respiratory specimens during the acute phase of infection. The lowest concentration of SARS-CoV-2 viral copies this assay can detect is 138 copies/mL. A negative result does not preclude SARS-Cov-2 infection and should not be used as the sole basis for treatment or other patient management decisions. A negative result may occur with  improper specimen collection/handling, submission of specimen other than nasopharyngeal swab, presence of viral mutation(s) within the areas targeted by this assay, and inadequate number of viral copies(<138 copies/mL). A negative result must be combined with clinical observations, patient history, and epidemiological information. The expected result is Negative.  Fact Sheet for Patients:  EntrepreneurPulse.com.au  Fact Sheet for Healthcare Providers:  IncredibleEmployment.be  This test is no t yet approved or cleared by the [place] FDA and  has been authorized for detection and/or diagnosis of SARS-CoV-2 by FDA under an Emergency Use Authorization (EUA). This EUA will remain  in effect (meaning this test can be used) for the duration of the COVID-19 declaration under Section 564(b)(1) of the Act, 21 U.S.C.section 360bbb-3(b)(1), unless the authorization is terminated  or revoked sooner.       Influenza A by PCR NEGATIVE NEGATIVE Final   Influenza B by PCR NEGATIVE NEGATIVE Final    Comment: (NOTE) The Xpert Xpress SARS-CoV-2/FLU/RSV plus assay is intended as an aid in the diagnosis of influenza from Nasopharyngeal swab specimens and should not be used as a sole basis for treatment. Nasal washings and aspirates are unacceptable for Xpert Xpress SARS-CoV-2/FLU/RSV testing.  Fact Sheet for Patients: EntrepreneurPulse.com.au  Fact Sheet for Healthcare Providers: IncredibleEmployment.be  This test is not yet approved or  cleared by the [place] FDA and has been authorized for detection and/or diagnosis of SARS-CoV-2 by FDA under an Emergency Use Authorization (EUA). This EUA will remain in effect (meaning this test can be used) for the duration of the COVID-19 declaration under Section 564(b)(1) of the Act, 21 U.S.C. section 360bbb-3(b)(1), unless the authorization is terminated or revoked.  Performed at [facility], [place] [address], [place], [place] [address]       Radiology Studies: CT Head Wo Contrast  Result Date: 07/10/2021 CLINICAL DATA:  Altered mental status, delirium EXAM: CT HEAD WITHOUT CONTRAST TECHNIQUE: Contiguous axial images were obtained from the base of the skull through  the vertex without intravenous contrast. COMPARISON:  12/07/2020 FINDINGS: Brain: No evidence of acute infarction, hemorrhage, cerebral edema, mass, mass effect, or midline shift. No hydrocephalus or extra-axial fluid collection. Mild generalized atrophy. Periventricular white matter changes, likely the sequela of chronic small vessel ischemic disease. Vascular: No hyperdense vessel. Skull: Normal. Negative for fracture or focal lesion. Sinuses/Orbits: No acute finding. Other: The mastoid air cells are well aerated. IMPRESSION: IMPRESSION No acute intracranial process. No etiology is seen for the patient's altered mental status. Electronically Signed   By: Merilyn Baba M.D.   On: 07/10/2021 18:59   DG Chest Portable 1 View  Result Date: 07/10/2021 CLINICAL DATA:  Altered mental status. EXAM: PORTABLE CHEST 1 VIEW COMPARISON:  Chest x-ray 04/12/2021. FINDINGS: Cardiac silhouette is enlarged, unchanged. Aorta is tortuous. There is no focal lung infiltrate, pleural effusion or pneumothorax. No acute fractures are seen. IMPRESSION: 1. No acute cardiopulmonary process. 2. Stable cardiomegaly. Electronically Signed   By: Ronney Asters M.D.   On: 07/10/2021 17:09    Scheduled Meds:  albuterol  10 mg Nebulization Once    enoxaparin (LOVENOX) injection  55 mg Subcutaneous Q24H   pantoprazole (PROTONIX) IV  40 mg Intravenous Q24H   Continuous Infusions:  dextrose 5% lactated ringers 125 mL/hr at 07/11/21 1009   insulin 2.2 Units/hr (07/11/21 0631)   lactated ringers Stopped (07/11/21 0014)     LOS: 0 days   Time spent: 35 minutes.  Patrecia Pour, MD [facility] [email or web address] 07/11/2021, 10:11 AM

## 2021-07-11 NOTE — Evaluation (Signed)
Occupational Therapy Evaluation Patient Details Name: [name] MRN: [id number] DOB: 03-21-1956 Today's Date: 07/11/2021   History of Present Illness Pt is a 65 y.o. female admitted 12/9 due to fall from wheelchair and AMS. CBG elevated at 551, has not been compliant with medications. PMH:  HFpEF, hypertension, obesity, hyperlipidemia, T2DM, glaucoma, GERD, CAD, CKD   Clinical Impression   Pt presents with diagnoses above and deficits in cognition, strength, and endurance. Pt A&Ox1, so difficult to obtain PLOF history though pt does endorse assist with bathing/dressing and endorses recent falls (most recent one from wheelchair). Pt unable to consistently follow commands for ADLs/bed mobility requiring Total A for UB ADLs (will require assist for self feeding) and Total A x 2 for LB ADLs bed level at this time. Unable to progress to EOB attempts without +2 assist this AM. Anticipate as cognition improves, pt functional abilities will improve. At this time, would recommend SNF rehab prior to return home unless family able to provide the +2 physical assist for bed level ADLs and mechanical lift transfers. Will continue to follow acutely and update DC recs as appropriate.      Recommendations for follow up therapy are one component of a multi-disciplinary discharge planning process, led by the attending physician.  Recommendations may be updated based on patient status, additional functional criteria and insurance authorization.   Follow Up Recommendations  Skilled nursing-short term rehab (<3 hours/day) (unless family able to provide +2 physical assist at home)    Assistance Recommended at Discharge Frequent or constant Supervision/Assistance  Functional Status Assessment  Patient has had a recent decline in their functional status and demonstrates the ability to make significant improvements in function in a reasonable and predictable amount of time.  Equipment Recommendations  Hospital  bed;Other (comment) (hoyer lift)    Recommendations for Other Services       Precautions / Restrictions Precautions Precautions: Fall;Other (comment) Precaution Comments: impaired vision Restrictions Weight Bearing Restrictions: No      Mobility Bed Mobility Overal bed mobility: Needs Assistance Bed Mobility: Rolling Rolling: Total assist         General bed mobility comments: Total A for rolling to side to assess abilities to assist during peri care. tactile cues to reach/hold to rail, difficulty sustaining    Transfers                   General transfer comment: deferred, will need lift      Balance                                           ADL either performed or assessed with clinical judgement   ADL Overall ADL's : Needs assistance/impaired Eating/Feeding: Total assistance;Bed level Eating/Feeding Details (indicate cue type and reason): notified RN based on command following- will likley need feeding assist Grooming: Total assistance;Bed level;Wash/dry face Grooming Details (indicate cue type and reason): HOH and tactile cues to bring to face, able to wash one eyes but  required assist to attend/complete remainder of task Upper Body Bathing: Total assistance;Bed level   Lower Body Bathing: Total assistance;+2 for physical assistance;+2 for safety/equipment;Bed level   Upper Body Dressing : Total assistance;Bed level   Lower Body Dressing: Total assistance;+2 for physical assistance;+2 for safety/equipment;Bed level       Toileting- Clothing Manipulation and Hygiene: Total assistance;+2 for physical assistance;+2 for safety/equipment;Bed level Toileting -  Clothing Manipulation Details (indicate cue type and reason): noted with purewick malfunction on entry with wet linen - RN and NT aware. Difficulty in rolling pt with one person assist due to difficulty following commands to hold to bedrail to assist       General ADL Comments:  Limited primarily by cognition     Vision Baseline Vision/History: 1 Wears glasses;3 Glaucoma;4 Cataracts Ability to See in Adequate Light: 2 Moderately impaired Patient Visual Report: No change from baseline (baseline visual deficits) Vision Assessment?: Vision impaired- to be further tested in functional context Additional Comments: baseline vision deficits, R eye with cloudy covering     Perception     Praxis      Pertinent Vitals/Pain Pain Assessment: Faces Faces Pain Scale: No hurt Pain Intervention(s): Monitored during session     Hand Dominance Right   Extremity/Trunk Assessment Upper Extremity Assessment Upper Extremity Assessment: Difficult to assess due to impaired cognition (tightness in digits noted, able to be stretched into extension (likely from pt resting in flexion during night))   Lower Extremity Assessment Lower Extremity Assessment: Defer to PT evaluation       Communication Communication Communication: Expressive difficulties;Other (comment) (repetitive)   Cognition Arousal/Alertness: Awake/alert;Lethargic Behavior During Therapy: Flat affect Overall Cognitive Status: No family/caregiver present to determine baseline cognitive functioning Area of Impairment: Orientation;Attention;Memory;Following commands;Safety/judgement;Awareness;Problem solving                 Orientation Level: Disoriented to;Place;Time;Situation Current Attention Level: Focused Memory: Decreased short-term memory Following Commands: Follows one step commands inconsistently Safety/Judgement: Decreased awareness of safety;Decreased awareness of deficits Awareness: Intellectual Problem Solving: Slow processing;Decreased initiation;Requires verbal cues;Difficulty sequencing;Requires tactile cues General Comments: Pt answers to name, able to answer husband's name. Unable to identify location, month, or reason for being at hospital. OT initially says "good morning" to pt with  pt repeating this phrase throughout session. able to provide some PLOF info that coordinates with previous admission. Benefits from tactile cues and HOH to follow basic commands though inconsistent     General Comments  HR 70s, RN in to assess cognition/presentation as well    Exercises     Shoulder Instructions      Home Living Family/patient expects to be discharged to:: Private residence Living Arrangements: Spouse/significant other Available Help at Discharge: Family;Available 24 hours/day Type of Home: Apartment Home Access: Stairs to enter [employer] of Steps: 2-3 from parking area to door Entrance Stairs-Rails: None Home Layout: One level     Bathroom Shower/Tub: Teacher, early years/pre: Standard Bathroom Accessibility: No   Home Equipment: Conservation officer, nature (2 wheels);Wheelchair - manual;Cane - single point;Shower seat   Additional Comments: doors in house too narrow for w/c and walker      Prior Functioning/Environment Prior Level of Function : Needs assist;Patient poor historian/Family not available       Physical Assist : ADLs (physical);Mobility (physical) Mobility (physical): Bed mobility;Transfers;Gait ADLs (physical): Bathing;Dressing;IADLs;Toileting Mobility Comments: Per chart, pt fell from wheelchair leading to this admission. pt reports using wheelchair for mobility in the home but per previous admission, w/c does not fit in doorways at home. Per chart, husband assists with bed mobility and standing as needed. Pt will use walker for mobility in the home with furniture walking in/out of bathroom. Frequent falls and unable to get up without multiple people to assist ADLs Comments: Pt reports husband assists with LB bathing and dressing. Per chart, pt uses depends at home. Husband does IADls and drives  OT Problem List: Decreased strength;Decreased activity tolerance;Impaired balance (sitting and/or standing);Decreased  cognition;Decreased safety awareness;Decreased knowledge of precautions;Obesity      OT Treatment/Interventions: Self-care/ADL training;Therapeutic exercise;Energy conservation;DME and/or AE instruction;Therapeutic activities;Patient/family education    OT Goals(Current goals can be found in the care plan section) Acute Rehab OT Goals Patient Stated Goal: unable to state OT Goal Formulation: Patient unable to participate in goal setting Time For Goal Achievement: 07/25/21 Potential to Achieve Goals: Fair  OT Frequency: Min 2X/week   Barriers to D/C:            Co-evaluation              AM-PAC OT "6 Clicks" Daily Activity     Outcome Measure Help from another person eating meals?: Total Help from another person taking care of personal grooming?: Total Help from another person toileting, which includes using toliet, bedpan, or urinal?: Total Help from another person bathing (including washing, rinsing, drying)?: Total Help from another person to put on and taking off regular upper body clothing?: Total Help from another person to put on and taking off regular lower body clothing?: Total 6 Click Score: 6   End of Session Nurse Communication: Mobility status  Activity Tolerance: Other (comment) (limited by cognition) Patient left: in bed;with call bell/phone within reach;with bed alarm set;with nursing/sitter in room  OT Visit Diagnosis: Unsteadiness on feet (R26.81);Other abnormalities of gait and mobility (R26.89);Muscle weakness (generalized) (M62.81);Other symptoms and signs involving cognitive function                Time: 0716-0730 OT Time Calculation (min): 14 min Charges:  OT General Charges $OT Visit: 1 Visit OT Evaluation $OT Eval Moderate Complexity: 1 Mod  Malachy Chamber, OTR/L Acute Rehab Services Office: [phone]   Layla Maw 07/11/2021, 7:45 AM

## 2021-07-12 LAB — GLUCOSE, CAPILLARY
Glucose-Capillary: 232 mg/dL — ABNORMAL HIGH (ref 70–99)
Glucose-Capillary: 268 mg/dL — ABNORMAL HIGH (ref 70–99)
Glucose-Capillary: 400 mg/dL — ABNORMAL HIGH (ref 70–99)
Glucose-Capillary: 319 mg/dL — ABNORMAL HIGH (ref 70–99)
Glucose-Capillary: 269 mg/dL — ABNORMAL HIGH (ref 70–99)
Glucose-Capillary: 197 mg/dL — ABNORMAL HIGH (ref 70–99)

## 2021-07-12 LAB — BASIC METABOLIC PANEL
Anion gap: 10 (ref 5–15)
BUN: 11 mg/dL (ref 8–23)
CO2: 23 mmol/L (ref 22–32)
Calcium: 8.6 mg/dL — ABNORMAL LOW (ref 8.9–10.3)
Chloride: 101 mmol/L (ref 98–111)
Creatinine, Ser: 1.11 mg/dL — ABNORMAL HIGH (ref 0.44–1.00)
GFR, Estimated: 55 mL/min — ABNORMAL LOW (ref >60)
Glucose, Bld: 236 mg/dL — ABNORMAL HIGH (ref 70–99)
Potassium: 2.9 mmol/L — ABNORMAL LOW (ref 3.5–5.1)
Sodium: 134 mmol/L — ABNORMAL LOW (ref 135–145)

## 2021-07-12 LAB — MAGNESIUM: Magnesium: 1.7 mg/dL (ref 1.7–2.4)

## 2021-07-12 MED ORDER — INSULIN GLARGINE-YFGN 100 UNIT/ML ~~LOC~~ SOLN
20.0000 [IU] | Freq: Two times a day (BID) | SUBCUTANEOUS | Status: DC
  Administered 2021-07-12: 20 [IU] via SUBCUTANEOUS
  Filled 2021-07-12 (×2): qty 0.2

## 2021-07-12 MED ORDER — INSULIN ASPART 100 UNIT/ML IJ SOLN
3.0000 [IU] | Freq: Three times a day (TID) | INTRAMUSCULAR | Status: DC
  Administered 2021-07-12 – 2021-07-15 (×2): 3 [IU] via SUBCUTANEOUS

## 2021-07-12 MED ORDER — ATORVASTATIN CALCIUM 80 MG PO TABS
80.0000 mg | ORAL_TABLET | Freq: Every day | ORAL | Status: DC
  Administered 2021-07-12 – 2021-07-17 (×6): 80 mg via ORAL
  Filled 2021-07-12 (×6): qty 1

## 2021-07-12 MED ORDER — ASPIRIN EC 81 MG PO TBEC
81.0000 mg | DELAYED_RELEASE_TABLET | Freq: Every day | ORAL | Status: DC
  Administered 2021-07-12 – 2021-07-17 (×6): 81 mg via ORAL
  Filled 2021-07-12 (×6): qty 1

## 2021-07-12 MED ORDER — METOPROLOL SUCCINATE ER 100 MG PO TB24
200.0000 mg | ORAL_TABLET | Freq: Every day | ORAL | Status: DC
  Administered 2021-07-12 – 2021-07-17 (×6): 200 mg via ORAL
  Filled 2021-07-12 (×6): qty 2

## 2021-07-12 MED ORDER — ISOSORBIDE MONONITRATE ER 60 MG PO TB24
120.0000 mg | ORAL_TABLET | Freq: Every day | ORAL | Status: DC
  Administered 2021-07-12 – 2021-07-17 (×6): 120 mg via ORAL
  Filled 2021-07-12 (×6): qty 2

## 2021-07-12 MED ORDER — POTASSIUM CHLORIDE CRYS ER 20 MEQ PO TBCR
40.0000 meq | EXTENDED_RELEASE_TABLET | Freq: Two times a day (BID) | ORAL | Status: AC
  Administered 2021-07-12 (×2): 40 meq via ORAL
  Filled 2021-07-12 (×2): qty 2

## 2021-07-12 MED ORDER — ACETAZOLAMIDE ER 500 MG PO CP12
500.0000 mg | ORAL_CAPSULE | Freq: Two times a day (BID) | ORAL | Status: DC
  Administered 2021-07-12 – 2021-07-17 (×11): 500 mg via ORAL
  Filled 2021-07-12 (×12): qty 1

## 2021-07-12 MED ORDER — INSULIN GLARGINE-YFGN 100 UNIT/ML ~~LOC~~ SOLN
10.0000 [IU] | Freq: Once | SUBCUTANEOUS | Status: AC
  Administered 2021-07-12: 10 [IU] via SUBCUTANEOUS
  Filled 2021-07-12: qty 0.1

## 2021-07-12 MED ORDER — INSULIN ASPART 100 UNIT/ML IJ SOLN
0.0000 [IU] | Freq: Three times a day (TID) | INTRAMUSCULAR | Status: DC
  Administered 2021-07-12: 15 [IU] via SUBCUTANEOUS
  Administered 2021-07-12: 11 [IU] via SUBCUTANEOUS
  Administered 2021-07-12 – 2021-07-13 (×2): 5 [IU] via SUBCUTANEOUS
  Administered 2021-07-13: 3 [IU] via SUBCUTANEOUS
  Administered 2021-07-13: 5 [IU] via SUBCUTANEOUS
  Administered 2021-07-14 – 2021-07-15 (×4): 3 [IU] via SUBCUTANEOUS

## 2021-07-12 MED ORDER — INSULIN GLARGINE-YFGN 100 UNIT/ML ~~LOC~~ SOLN
35.0000 [IU] | Freq: Two times a day (BID) | SUBCUTANEOUS | Status: DC
  Administered 2021-07-12 – 2021-07-15 (×6): 35 [IU] via SUBCUTANEOUS
  Filled 2021-07-12 (×7): qty 0.35

## 2021-07-12 NOTE — TOC Initial Note (Addendum)
Transition of Care [facility]) - Initial/Assessment Note    Patient Details  Name: [name] MRN: [id number] Date of Birth: [birth year]  Transition of [facility]) CM/SW Contact:    Bary Castilla, LCSW Phone Number:[phone] 07/12/2021, 2:36 PM  Clinical Narrative:                  CSW spoke with pt's spouse- [name] due to pt being only orientated to self to discuss PT recommendation of a SNF. [name] was in agreement with recommendation of ST SNF. CSW discussed the SNF process.CSW provided [name] with the medicare.gov website however he informed CSW to follow up with sister- in -law Mardene Celeste because he was unable to get on the Internet. [name] gave CSW permission to fax referrals out to local facilities.CSW answered questions about the SNF process and the next steps in the process. [name] informed CSW that he prefers that Mardene Celeste makes SNF choice decision. CSW has added Patricia's information on face sheet.  CSW spoke with pt's sister Mardene Celeste and inform her of the website.CSW discussed with Mardene Celeste facilities near pt's home and she stated that she did not want pt to go to [facility]. CSW encouraged her to follow up with [name] about facility choice.  TOC team will continue to assist with discharge planning needs.  .     Expected Discharge Plan: Skilled Nursing Facility Barriers to Discharge: Continued Medical Work up, Ship broker, SNF Pending bed offer   Patient Goals and CMS Choice   CMS Medicare.gov Compare Post Acute Care list provided to:: Patient Represenative (must comment) (provided website)    Expected Discharge Plan and Services Expected Discharge Plan: [place]                                              Prior Living Arrangements/Services   Lives with:: Spouse, Self            Care giver support system in place?: Yes (comment)      Activities of Daily Living      Permission Sought/Granted      Share Information  with NAME: [name]  Permission granted to share info w AGENCY: SNFS  Permission granted to share info w Relationship: spouse  Permission granted to share info w Contact Information: [phone]  Emotional Assessment   Attitude/Demeanor/Rapport: Unable to Assess Affect (typically observed): Unable to Assess Orientation: : Oriented to Self      Admission diagnosis:  Hyperkalemia [E87.5] Altered mental status, unspecified altered mental status type [R41.82] Hyperosmolar hyperglycemic state (HHS) (HCC) [B35.32] Acute metabolic encephalopathy [D92.42] Patient Active Problem List   Diagnosis Date Noted   High anion gap metabolic acidosis 68/34/1962   Dehydration 07/11/2021   Pseudohyponatremia 07/11/2021   Acute kidney injury superimposed on CKD ([place]) 07/11/2021   Hypoalbuminemia due to protein-calorie malnutrition ([place]) 07/11/2021   Low TSH level 07/11/2021   Noncompliance with medication regimen 07/11/2021   Altered mental status 22/97/9892   Acute metabolic encephalopathy 11/94/1740   Hyperosmolar hyperglycemic state (HHS) ([place]) 07/10/2021   Nausea & vomiting 12/01/2020   Ovarian mass, left 12/01/2020   Hypokalemia 12/01/2020   Leukocytosis 12/01/2020   Generalized weakness 11/30/2020   Acute respiratory failure with hypoxia ([place]) 01/24/2020   Chest pain 01/23/2020   Acute exacerbation of CHF (congestive heart failure) ([place]) 01/23/2020   Functional urinary incontinence  09/07/2019   Urge incontinence of urine 09/07/2019   Legally blind in right eye, as defined in [place] 08/11/2018   Bilateral carpal tunnel syndrome 04/13/2018   Adenomatous polyp of colon 04/13/2018   Fibrocystic breast changes, right 12/12/2017   Drug-induced constipation 09/15/2017   Primary osteoarthritis of both knees 04/05/2017   Chronic bilateral low back pain 03/07/2017   Chronic pain of right knee 03/07/2017   Vitamin B12 deficiency 12/09/2016   Incidental lung nodule, > 52mm and < 70mm 09/14/2016    Vitreous hemorrhage of right eye ([place])    Diabetic gastroparesis associated with type 2 diabetes mellitus ([place]) 09/12/2015   Controlled type 2 diabetes mellitus with complication, with long-term current use of insulin ([place]) 09/12/2015   Chronic renal insufficiency, stage III (moderate) (HCC) 03/19/2015   Prolonged Q-T interval on ECG 08/10/2013   Dyslipidemia 04/02/2013   Acute on chronic diastolic heart failure ([place]) 04/07/2012   Presumed OSA (obstructive sleep apnea) 04/06/2012   NSTEMI  post-op 04/04/12-medical Rx 04/04/2012   Chronic diastolic heart failure ([place]) 04/2012   Severe obesity (BMI >= 40) ([place]) 01/17/2012   GERD 08/21/2009   Complex endometrial hyperplasia with atypia 03/20/2009   PERIPHERAL EDEMA 03/20/2009   Coronary atherosclerosis-not a surgical or PCI candidate 2010 02/27/2009   Proliferative diabetic retinopathy ([place]) 10/07/2007   DETACHED RETINA, BILATERAL, HX OF 09/07/2007   History of chronic Iron defeicency anemia with acute post op anemia, transfused after debridment 04/04/12 04/17/2007   Essential hypertension 04/17/2007   PCP:  Ladell Pier, MD Pharmacy:   [facility], [place] [address] Phone: [phone] Fax: [phone]  [place] [address] Phone: [phone] Fax: [phone]  [facility] ([place], [place] [facility] [place] [address] [address] Phone: [phone] Fax: [phone]  [facility] [address] [place] [address] Phone: [phone] Fax: [phone]  [facility] [place], [place] [address] Phone: [phone] Fax: [phone]  [place], [place] [address] Phone: [phone] Fax: [phone]     Social Determinants of Health (SDOH) Interventions    Readmission Risk Interventions No flowsheet data found.

## 2021-07-12 NOTE — NC FL2 (Signed)
[place] MEDICAID FL2 LEVEL OF CARE SCREENING TOOL     IDENTIFICATION  Patient Name: [name] Birthdate: 1955-11-16 Sex: female Admission Date (Current Location): 07/10/2021  [facility] and [place] Number:  Herbalist and Address:  [facility] [place], [place] [address] [place], [place] [address]      Provider Number:  [facility])  Attending Physician Name and Address:  Patrecia Pour, MD  Relative Name and Phone Number:  [name] (spouse) [phone]    Current Level of Care: Hospital Recommended Level of Care: [place] Prior Approval Number:    Date Approved/Denied:   PASRR Number: [id number]  Discharge Plan: SNF    Current Diagnoses: Patient Active Problem List   Diagnosis Date Noted   High anion gap metabolic acidosis 54/62/7035   Dehydration 07/11/2021   Pseudohyponatremia 07/11/2021   Acute kidney injury superimposed on CKD ([place]) 07/11/2021   Hypoalbuminemia due to protein-calorie malnutrition ([place]) 07/11/2021   Low TSH level 07/11/2021   Noncompliance with medication regimen 07/11/2021   Altered mental status 00/93/8182   Acute metabolic encephalopathy 99/37/1696   Hyperosmolar hyperglycemic state (HHS) ([place] ) 07/10/2021   Nausea & vomiting 12/01/2020   Ovarian mass, left 12/01/2020   Hypokalemia 12/01/2020   Leukocytosis 12/01/2020   Generalized weakness 11/30/2020   Acute respiratory failure with hypoxia ([place]) 01/24/2020   Chest pain 01/23/2020   Acute exacerbation of CHF (congestive heart failure) ([name]) 01/23/2020   Functional urinary incontinence 09/07/2019   Urge incontinence of urine 09/07/2019   Legally blind in right eye, as defined in [place] 08/11/2018   Bilateral carpal tunnel syndrome 04/13/2018   Adenomatous polyp of colon 04/13/2018   Fibrocystic breast changes, right 12/12/2017   Drug-induced constipation 09/15/2017   Primary osteoarthritis of both knees 04/05/2017   Chronic bilateral low back  pain 03/07/2017   Chronic pain of right knee 03/07/2017   Vitamin B12 deficiency 12/09/2016   Incidental lung nodule, > 81mm and < 13mm 09/14/2016   Vitreous hemorrhage of right eye ([place])    Diabetic gastroparesis associated with type 2 diabetes mellitus ([place]) 09/12/2015   Controlled type 2 diabetes mellitus with complication, with long-term current use of insulin ([place]) 09/12/2015   Chronic renal insufficiency, stage III (moderate) (HCC) 03/19/2015   Prolonged Q-T interval on ECG 08/10/2013   Dyslipidemia 04/02/2013   Acute on chronic diastolic heart failure ([place]) 04/07/2012   Presumed OSA (obstructive sleep apnea) 04/06/2012   NSTEMI  post-op 04/04/12-medical Rx 04/04/2012   Chronic diastolic heart failure ([place]) 04/2012   Severe obesity (BMI >= 40) ([place]) 01/17/2012   GERD 08/21/2009   Complex endometrial hyperplasia with atypia 03/20/2009   PERIPHERAL EDEMA 03/20/2009   Coronary atherosclerosis-not a surgical or PCI candidate 2010 02/27/2009   Proliferative diabetic retinopathy ([place]) 10/07/2007   DETACHED RETINA, BILATERAL, HX OF 09/07/2007   History of chronic Iron defeicency anemia with acute post op anemia, transfused after debridment 04/04/12 04/17/2007   Essential hypertension 04/17/2007    Orientation RESPIRATION BLADDER Height & Weight     Self  Normal Incontinent Weight: 260 lb (117.9 kg) Height:  5\' 3"  (160 cm)  BEHAVIORAL SYMPTOMS/MOOD NEUROLOGICAL BOWEL NUTRITION STATUS      Continent Diet (see discharge summary)  AMBULATORY STATUS COMMUNICATION OF NEEDS Skin   Extensive Assist Verbally Normal                       Personal Care Assistance Level of Assistance  Bathing,  Feeding, Dressing, Total care Bathing Assistance: Maximum assistance Feeding assistance: Limited assistance Dressing Assistance: Maximum assistance Total Care Assistance: Maximum assistance   Functional Limitations Info  Sight, Hearing, Speech Sight Info: Adequate Hearing Info: Adequate Speech  Info: Adequate    SPECIAL CARE FACTORS FREQUENCY  PT (By licensed PT), OT (By licensed OT)     PT Frequency: min 4x weekly OT Frequency: min 4x weekly            Contractures Contractures Info: Not present    Additional Factors Info  Code Status, Allergies Code Status Info: full Allergies Info: ativan, orange fruit           Current Medications (07/12/2021):  This is the current hospital active medication list Current Facility-Administered Medications  Medication Dose Route Frequency Provider Last Rate Last Admin   albuterol (PROVENTIL) (2.5 MG/3ML) 0.083% nebulizer solution 10 mg  10 mg Nebulization Once Adefeso, Oladapo, DO       cefTRIAXone (ROCEPHIN) 2 g in sodium chloride 0.9 % 100 mL IVPB  2 g Intravenous Q24H Patrecia Pour, MD   Stopped at 07/11/21 1435   dextrose 5% in lactated ringers with KCl 20 mEq/L infusion   Intravenous Continuous Patrecia Pour, MD 75 mL/hr at 07/11/21 1346 New Bag at 07/11/21 1346   dextrose 50 % solution 0-50 mL  0-50 mL Intravenous PRN Adefeso, Oladapo, DO       enoxaparin (LOVENOX) injection 55 mg  55 mg Subcutaneous Q24H Adefeso, Oladapo, DO   55 mg at 07/11/21 2117   hydrALAZINE (APRESOLINE) injection 10 mg  10 mg Intravenous Q6H PRN Adefeso, Oladapo, DO       insulin aspart (novoLOG) injection 0-15 Units  0-15 Units Subcutaneous TID WC Vance Gather B, MD       insulin glargine-yfgn (SEMGLEE) injection 20 Units  20 Units Subcutaneous BID Patrecia Pour, MD   20 Units at 07/12/21 0841   pantoprazole (PROTONIX) injection 40 mg  40 mg Intravenous Q24H Adefeso, Oladapo, DO   40 mg at 07/12/21 0541     Discharge Medications: Please see discharge summary for a list of discharge medications.  Relevant Imaging Results:  Relevant Lab Results:   Additional Information SSN:[id number]  Alberteen Sam, LCSW

## 2021-07-12 NOTE — Progress Notes (Signed)
PROGRESS NOTE  [name]  WJX:[id number] DOB: [birth year] DOA: 07/10/2021 PCP: Ladell Pier, MD   Brief Narrative: [name] is a 65 y.o. female with a history of morbid obesity, T2DM, CAD, HFpEF, HTN, HLD, CKD IIIb who was brought to the ED 12/9 by EMS from home due to recurrently slipping out of her wheelchair found to be severely hyperglycemic and progressively confused/somnolent. Hyperglycemia, hyperkalemia were noted for which IV fluids and IV insulin were started. Metabolic parameters have improved and patient's level of alertness has improved as well. SNF rehabilitation is recommended and is being pursued.  Assessment & Plan: Principal Problem:   Hyperosmolar hyperglycemic state (HHS) ([place]) Active Problems:   Prolonged Q-T interval on ECG   Hypokalemia   High anion gap metabolic acidosis   Dehydration   Pseudohyponatremia   Acute kidney injury superimposed on CKD (HCC)   Hypoalbuminemia due to protein-calorie malnutrition (HCC)   Low TSH level   Noncompliance with medication regimen   Altered mental status   Acute metabolic encephalopathy  HHS and mild DKA in IDT2DM with diabetic neuropathy, retinopathy, nephropathy: Decompensation reportedly due to nonadherence to medications, though chronically uncontrolled as well with HbA1c 12.2% (avg CBG ~300mg /dl). - Came off IV insulin. Home 75/25 insulin reportedly 54u BID. Lowering relative doses in basal-bolus insulin initiating today given unclear amount of po intake to expect. Will titrate as needed based on trends later today.  Chronic HFpEF, HTN:  - Stop IVF now that she'll take po.  - Restart home acetazolamide, metoprolol 200mg  daily, imdur. Reports not taking losartan 100mg  daily, lasix 40mg  po BID. Will plan to restart diltiazem 240mg  daily as well if HR not low once restarting BB.  CAD: No chest pain reported, no ST elevations. Diffuse 3VD (RCA, LCx, D1) by LHC remotely. Tn wnl. - Restart ASA, statin, BB,  imdur 120mg  daily  AKI on stage IIIb CKD: SCr relatively stable - Repeat BMP still pending from this AM.   Iron deficiency anemia due to chronic blood loss due to uterine bleeding: Hysterectomy reportedly recommended. Hgb currently normal. - Megace on med list.  - Recheck vitamin B12 (on med list but reported nonadherence)  Weakness, falls:  - PT/OT consulted. SNF recommended.  - Optimize medically prior to discharge  Acute metabolic encephalopathy: Due to HHS. Negative head CT. - Delirium precautions.  - With significant pyuria and bacteriuria, otherwise unexplained leukocytosis, and inability to really report urinary symptoms, we will check urine culture and empirically put on ceftriaxone.  Suppressed TSH: Also had low level earlier this year during an admission. No signs (exophthalmos, palpable goiter) or symptoms (weight loss, heat intolerance, anxiety, tremor, palpitations) of hyperthyroidism at this time. Does not appear to be taking biotin. - T4 low. Check free T3, and TSI. Also, recommend rechecking outside scope of acute illness.   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 Denman George for this and bleeding  Morbid obesity: Estimated body mass index is 46.06 kg/m as calculated from the following:   Height as of this encounter: 5\' 3"  (1.6 m).   Weight as of this encounter: 117.9 kg.  Moderate protein calorie malnutrition:  - Supplement protein as able  Glaucoma: With retinopathy leads to very poor visual acuity.  - Continue gtt's  HLD:  - Continue statin when taking po  GERD:  - Continue PPI when taking po  OSA: Per chart review diagnosed in 2013, not on CPAP.   Prolonged QT interval:  -  Continue cardiac monitoring and optimizing electrolytes.   Hypokalemia: - AM labs pending, though supplementing in IVF. May need to supp po based on AM levels.  DVT prophylaxis: Lovenox 0.5mg /kg q24h since no active bleeding noted Code Status: Full Family  Communication: None at bedside Disposition Plan:  Status is: Inpatient, still not at mental baseline, needs DC to higher level of care, working up conditions as above.  Consultants:  None  Procedures:  None  Antimicrobials: Ceftriaxone 12/10 - 12/12   Subjective: More alert, hungry now. No dyspnea, chest pain, palpitations. She has intermittent vaginal bleeding which is occurring now. Not sure if she's had dysuria but does have this at times. No heat intolerance, changes in hair or skin as she knows.   Objective: Vitals:   07/11/21 1926 07/11/21 2303 07/12/21 0316 07/12/21 0803  BP: (!) 143/78 137/72 (!) 144/70 (!) 157/80  Pulse: 74 72 73 78  Resp: 17 19 18 18   Temp: 98 F (36.7 C) 98.2 F (36.8 C) 98 F (36.7 C) 98.1 F (36.7 C)  TempSrc: Oral Oral Oral Oral  SpO2: 100% 100% 100% 99%  Weight:      Height:        Intake/Output Summary (Last 24 hours) at 07/12/2021 0932 Last data filed at 07/12/2021 0017 Gross per 24 hour  Intake 529.18 ml  Output 1220 ml  Net -690.82 ml   Filed Weights   07/10/21 1513  Weight: 117.9 kg   Gen: 65 y.o. female in no distress Pulm: Nonlabored breathing room air. Clear. CV: Regular rate and rhythm. No murmur, rub, or gallop. No JVD, no pitting dependent edema. GI: Abdomen soft, non-tender, non-distended, with normoactive bowel sounds.  Ext: Warm, no deformities Skin: No new rashes, lesions or ulcers on visualized skin. Neuro: Alert, interactive, mentally slowed but able to follow conversation. States she knows where she is but not how she got here. No focal neurological deficits. Psych: Judgement and insight appear marginal. Mood euthymic & affect congruent. Behavior is appropriate.    Data Reviewed: I have personally reviewed following labs and imaging studies  CBC: Recent Labs  Lab 07/10/21 1740 07/10/21 1837 07/11/21 0400  WBC 10.6*  --  11.5*  NEUTROABS 8.3*  --   --   HGB 14.4 13.9  14.3 14.3  HCT 43.9 41.0  42.0 42.8   MCV 94.0  --  90.7  PLT 237  --  494   Basic Metabolic Panel: Recent Labs  Lab 07/10/21 1740 07/10/21 1837 07/11/21 0400 07/11/21 0942  NA 131* 130*  129* 135 135  K 3.4* 6.7*  6.7* 3.0* 3.0*  CL 96* 97* 101 102  CO2 20*  --  24 23  GLUCOSE 487* 484* 202* 167*  BUN 28* 36* 21 19  CREATININE 1.95* 1.90* 1.46* 1.28*  CALCIUM 9.2  --  9.1 9.1  MG  --   --  2.1  --   PHOS  --   --  2.4*  --    GFR: Estimated Creatinine Clearance: 53.6 mL/min (A) (by C-G formula based on SCr of 1.28 mg/dL (H)). Liver Function Tests: Recent Labs  Lab 07/10/21 2000 07/11/21 0400  AST 12* 19  ALT 9 11  ALKPHOS 45 56  BILITOT 0.7 0.9  PROT 4.8* 6.7  ALBUMIN 1.9* 2.6*   No results for input(s): LIPASE, AMYLASE in the last 168 hours. Recent Labs  Lab 07/10/21 1740  AMMONIA 32   Coagulation Profile: No results for input(s): INR, PROTIME in the  last 168 hours. Cardiac Enzymes: Recent Labs  Lab 07/10/21 1923  CKTOTAL 72   BNP (last 3 results) No results for input(s): PROBNP in the last 8760 hours. HbA1C: Recent Labs    07/11/21 0400  HGBA1C 12.2*   CBG: Recent Labs  Lab 07/11/21 1610 07/11/21 2121 07/11/21 2356 07/12/21 0426 07/12/21 0801  GLUCAP 184* 198* 208* 232* 268*   Lipid Profile: No results for input(s): CHOL, HDL, LDLCALC, TRIG, CHOLHDL, LDLDIRECT in the last 72 hours. Thyroid Function Tests: Recent Labs    07/10/21 1834 07/11/21 0416  TSH 0.235*  --   FREET4  --  1.39*   Anemia Panel: No results for input(s): VITAMINB12, FOLATE, FERRITIN, TIBC, IRON, RETICCTPCT in the last 72 hours. Urine analysis:    Component Value Date/Time   COLORURINE YELLOW 07/10/2021 2014   APPEARANCEUR HAZY (A) 07/10/2021 2014   LABSPEC 1.022 07/10/2021 2014   PHURINE 5.0 07/10/2021 2014   GLUCOSEU >=500 (A) 07/10/2021 2014   HGBUR LARGE (A) 07/10/2021 2014   HGBUR large 04/03/2010 [place] NEGATIVE 07/10/2021 2014   KETONESUR 5 (A) 07/10/2021 2014    PROTEINUR 30 (A) 07/10/2021 2014   UROBILINOGEN 0.2 03/22/2013 1159   NITRITE NEGATIVE 07/10/2021 2014   LEUKOCYTESUR MODERATE (A) 07/10/2021 2014   Recent Results (from the past 240 hour(s))  Resp Panel by RT-PCR (Flu A&B, Covid) Nasopharyngeal Swab     Status: None   Collection Time: 07/10/21  3:22 PM   Specimen: Nasopharyngeal Swab; Nasopharyngeal(NP) swabs in vial transport medium  Result Value Ref Range Status   SARS Coronavirus 2 by RT PCR NEGATIVE NEGATIVE Final    Comment: (NOTE) SARS-CoV-2 target nucleic acids are NOT DETECTED.  The SARS-CoV-2 RNA is generally detectable in upper respiratory specimens during the acute phase of infection. The lowest concentration of SARS-CoV-2 viral copies this assay can detect is 138 copies/mL. A negative result does not preclude SARS-Cov-2 infection and should not be used as the sole basis for treatment or other patient management decisions. A negative result may occur with  improper specimen collection/handling, submission of specimen other than nasopharyngeal swab, presence of viral mutation(s) within the areas targeted by this assay, and inadequate number of viral copies(<138 copies/mL). A negative result must be combined with clinical observations, patient history, and epidemiological information. The expected result is Negative.  Fact Sheet for Patients:  EntrepreneurPulse.com.au  Fact Sheet for Healthcare Providers:  IncredibleEmployment.be  This test is no t yet approved or cleared by the [place] FDA and  has been authorized for detection and/or diagnosis of SARS-CoV-2 by FDA under an Emergency Use Authorization (EUA). This EUA will remain  in effect (meaning this test can be used) for the duration of the COVID-19 declaration under Section 564(b)(1) of the Act, 21 U.S.C.section 360bbb-3(b)(1), unless the authorization is terminated  or revoked sooner.       Influenza A by PCR  NEGATIVE NEGATIVE Final   Influenza B by PCR NEGATIVE NEGATIVE Final    Comment: (NOTE) The Xpert Xpress SARS-CoV-2/FLU/RSV plus assay is intended as an aid in the diagnosis of influenza from Nasopharyngeal swab specimens and should not be used as a sole basis for treatment. Nasal washings and aspirates are unacceptable for Xpert Xpress SARS-CoV-2/FLU/RSV testing.  Fact Sheet for Patients: EntrepreneurPulse.com.au  Fact Sheet for Healthcare Providers: IncredibleEmployment.be  This test is not yet approved or cleared by the [place] FDA and has been authorized for detection and/or diagnosis of SARS-CoV-2 by FDA under an  Emergency Use Authorization (EUA). This EUA will remain in effect (meaning this test can be used) for the duration of the COVID-19 declaration under Section 564(b)(1) of the Act, 21 U.S.C. section 360bbb-3(b)(1), unless the authorization is terminated or revoked.  Performed at [facility], [place] [address], [place], [place] [address]       Radiology Studies: CT Head Wo Contrast  Result Date: 07/10/2021 CLINICAL DATA:  Altered mental status, delirium EXAM: CT HEAD WITHOUT CONTRAST TECHNIQUE: Contiguous axial images were obtained from the base of the skull through the vertex without intravenous contrast. COMPARISON:  12/07/2020 FINDINGS: Brain: No evidence of acute infarction, hemorrhage, cerebral edema, mass, mass effect, or midline shift. No hydrocephalus or extra-axial fluid collection. Mild generalized atrophy. Periventricular white matter changes, likely the sequela of chronic small vessel ischemic disease. Vascular: No hyperdense vessel. Skull: Normal. Negative for fracture or focal lesion. Sinuses/Orbits: No acute finding. Other: The mastoid air cells are well aerated. IMPRESSION: IMPRESSION No acute intracranial process. No etiology is seen for the patient's altered mental status. Electronically Signed   By: Merilyn Baba M.D.   On: 07/10/2021 18:59   DG Chest Portable 1 View  Result Date: 07/10/2021 CLINICAL DATA:  Altered mental status. EXAM: PORTABLE CHEST 1 VIEW COMPARISON:  Chest x-ray 04/12/2021. FINDINGS: Cardiac silhouette is enlarged, unchanged. Aorta is tortuous. There is no focal lung infiltrate, pleural effusion or pneumothorax. No acute fractures are seen. IMPRESSION: 1. No acute cardiopulmonary process. 2. Stable cardiomegaly. Electronically Signed   By: Ronney Asters M.D.   On: 07/10/2021 17:09    Scheduled Meds:  albuterol  10 mg Nebulization Once   enoxaparin (LOVENOX) injection  55 mg Subcutaneous Q24H   insulin aspart  0-15 Units Subcutaneous TID WC   insulin glargine-yfgn  20 Units Subcutaneous BID   pantoprazole (PROTONIX) IV  40 mg Intravenous Q24H   Continuous Infusions:  cefTRIAXone (ROCEPHIN)  IV Stopped (07/11/21 1435)   dextrose 5% lactated ringers with KCl 20 mEq/L 75 mL/hr at 07/11/21 1346     LOS: 1 day   Time spent: 35 minutes.  Patrecia Pour, MD [facility] [email or web address] 07/12/2021, 9:32 AM

## 2021-07-13 ENCOUNTER — Inpatient Hospital Stay (HOSPITAL_COMMUNITY): Payer: Medicare HMO

## 2021-07-13 LAB — GLUCOSE, CAPILLARY
Glucose-Capillary: 108 mg/dL — ABNORMAL HIGH (ref 70–99)
Glucose-Capillary: 118 mg/dL — ABNORMAL HIGH (ref 70–99)
Glucose-Capillary: 125 mg/dL — ABNORMAL HIGH (ref 70–99)
Glucose-Capillary: 170 mg/dL — ABNORMAL HIGH (ref 70–99)
Glucose-Capillary: 190 mg/dL — ABNORMAL HIGH (ref 70–99)
Glucose-Capillary: 215 mg/dL — ABNORMAL HIGH (ref 70–99)
Glucose-Capillary: 229 mg/dL — ABNORMAL HIGH (ref 70–99)

## 2021-07-13 LAB — BASIC METABOLIC PANEL
Anion gap: 9 (ref 5–15)
BUN: 13 mg/dL (ref 8–23)
CO2: 23 mmol/L (ref 22–32)
Calcium: 9 mg/dL (ref 8.9–10.3)
Chloride: 104 mmol/L (ref 98–111)
Creatinine, Ser: 1.15 mg/dL — ABNORMAL HIGH (ref 0.44–1.00)
GFR, Estimated: 53 mL/min — ABNORMAL LOW (ref >60)
Glucose, Bld: 128 mg/dL — ABNORMAL HIGH (ref 70–99)
Potassium: 3.4 mmol/L — ABNORMAL LOW (ref 3.5–5.1)
Sodium: 136 mmol/L (ref 135–145)

## 2021-07-13 MED ORDER — PANTOPRAZOLE SODIUM 40 MG PO TBEC
40.0000 mg | DELAYED_RELEASE_TABLET | Freq: Every day | ORAL | Status: DC
  Administered 2021-07-14 – 2021-07-17 (×4): 40 mg via ORAL
  Filled 2021-07-13 (×4): qty 1

## 2021-07-13 MED ORDER — DILTIAZEM HCL ER COATED BEADS 120 MG PO CP24
240.0000 mg | ORAL_CAPSULE | Freq: Every day | ORAL | Status: DC
  Administered 2021-07-13 – 2021-07-17 (×5): 240 mg via ORAL
  Filled 2021-07-13 (×5): qty 2

## 2021-07-13 MED ORDER — POTASSIUM CHLORIDE CRYS ER 20 MEQ PO TBCR
40.0000 meq | EXTENDED_RELEASE_TABLET | Freq: Every day | ORAL | Status: DC
  Administered 2021-07-13: 40 meq via ORAL
  Filled 2021-07-13: qty 2

## 2021-07-13 NOTE — TOC Progression Note (Addendum)
Transition of Care [facility]) - Progression Note    Patient Details  Name: [name] MRN: [id number] Date of Birth: 08/28/1955  Transition [facility] Care [facility] CM/SW [place], [place] Phone Number: 07/13/2021, 1:20 PM  Clinical Narrative:     UpdateJuliann Pulse with [facility] ask the CSW tomorrow follow up with her on bed availability. CSW will follow up with Juliann Pulse with [facility] tomorrow to confirm SNF bed availability.  Update- CSW spoke with patients sister Mardene Celeste and provided SNF bed offers. Patients sister chose SNF placement at [facility]. CSW Awaiting callback from [place] with [facility] to confirm SNF bed. CSW will start insurance auth close to patient being medically ready.  CSW called patients sister Mardene Celeste and LVM. CSW awaiting callback to provide SNF bed offers for patient. CSW will continue to follow and assist with patients dc planning needs.  Expected Discharge Plan: Skilled Nursing Facility Barriers to Discharge: Continued Medical Work up, Ship broker, SNF Pending bed offer  Expected Discharge Plan and Services Expected Discharge Plan: [place]                                               Social Determinants of Health (SDOH) Interventions    Readmission Risk Interventions No flowsheet data found.

## 2021-07-13 NOTE — Progress Notes (Signed)
PROGRESS NOTE  [name]  JME:[id number] DOB: [birth year] DOA: 07/10/2021 PCP: Ladell Pier, MD   Brief Narrative: [name] is a 65 y.o. female with a history of morbid obesity, T2DM, CAD, HFpEF, HTN, HLD, CKD IIIb who was brought to the ED 12/9 by EMS from home due to recurrently slipping out of her wheelchair found to be severely hyperglycemic and progressively confused/somnolent. Hyperglycemia, hyperkalemia were noted for which IV fluids and IV insulin were started. Metabolic parameters have improved and patient's level of alertness has improved as well. SNF rehabilitation is recommended and is being pursued.  Assessment & Plan: Principal Problem:   Hyperosmolar hyperglycemic state (HHS) ([place]) Active Problems:   Prolonged Q-T interval on ECG   Hypokalemia   High anion gap metabolic acidosis   Dehydration   Pseudohyponatremia   Acute kidney injury superimposed on CKD (HCC)   Hypoalbuminemia due to protein-calorie malnutrition (HCC)   Low TSH level   Noncompliance with medication regimen   Altered mental status   Acute metabolic encephalopathy  HHS and mild DKA in IDT2DM with diabetic neuropathy, retinopathy, nephropathy: Decompensation reportedly due to nonadherence to medications, though chronically uncontrolled as well with HbA1c 12.2% (avg CBG ~300mg /dl). - Came off IV insulin. Home 75/25 insulin reportedly 54u BID. Titrated rapidly upward to glargine 35u BID and novolog 3u TIDWC + moderate SSI with good control and no hypoglycemia.   Chronic HFpEF, HTN:   - Restarted home acetazolamide, metoprolol 200mg  daily, imdur. Reports not taking losartan 100mg  daily, lasix 40mg  po BID. Will restart diltiazem 240mg  daily as well since HR and BP sustained.  CAD: No chest pain reported, no ST elevations. Diffuse 3VD (RCA, LCx, D1) by LHC remotely. Tn wnl. - Restarted ASA, statin, BB, imdur 120mg  daily  AKI on stage IIIb CKD: SCr stabilized.  Iron deficiency anemia due  to chronic blood loss due to uterine bleeding: Hysterectomy reportedly recommended. Hgb currently normal. - Megace on med list. Not continued since not actively bleeding. - Recheck vitamin B12 (on med list but reported nonadherence)  Weakness, falls:  - PT/OT consulted. SNF recommended. TOC consulted, family amenable. - Optimize medically prior to discharge  Acute metabolic encephalopathy: Due to HHS. Negative head CT. Has shown some improvement, now with what may be acute delirium, though needs further evaluation.  - Delirium precautions.  - Check brain MRI.  UTI:  - Continue ceftriaxone pending urine culture.  Suppressed TSH: Also had low level earlier this year during an admission. No signs (exophthalmos, palpable goiter) or symptoms (weight loss, heat intolerance, anxiety, tremor, palpitations) of hyperthyroidism at this time. Does not appear to be taking biotin. - T4 low. Check free T3, and TSI (pending). Also, recommend rechecking outside scope of acute illness.   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 - Follow up with Dr. Denman George for this and bleeding  Morbid obesity: Estimated body mass index is 46.06 kg/m as calculated from the following:   Height as of this encounter: 5\' 3"  (1.6 m).   Weight as of this encounter: 117.9 kg.  Moderate protein calorie malnutrition:  - Supplement protein as able  Glaucoma: With retinopathy leads to very poor visual acuity.  - Continue gtt's  HLD:  - Continue statin when taking po  GERD:  - Continue PPI when taking po  OSA: Per chart review diagnosed in 2013, not on CPAP.   Prolonged QT interval:  - Continue  cardiac monitoring and optimizing electrolytes.   Hypokalemia: - Supplement again today, recheck in AM.   DVT prophylaxis: Lovenox 0.5mg /kg q24h since no active bleeding noted Code Status: Full Family Communication: None at bedside. Spoke with spouse by  phone Disposition Plan:  Status is: Inpatient, still not at mental baseline, needs DC to higher level of care, working up conditions as above.  Consultants:  None  Procedures:  None  Antimicrobials: Ceftriaxone 12/10 - 12/12   Subjective: Needed mittens placed for a time yesterday because she was difficult to redirect. She remains more alert than her initial presentation but is disoriented and very very slow to respond. Does not answer some questions (e.g. do you feel weak or numb anywhere). She knows she lives in [place] and her spouse's name is [name].   Objective: Vitals:   07/13/21 0518 07/13/21 0741 07/13/21 0915 07/13/21 1100  BP: (!) 155/72 (!) 147/78 (!) 154/75 139/63  Pulse: 73 75 72 86  Resp: 20 18  17   Temp: 97.8 F (36.6 C) 97.8 F (36.6 C)  97.6 F (36.4 C)  TempSrc: Oral Oral  Oral  SpO2: 98% 100% 100% 99%  Weight:      Height:        Intake/Output Summary (Last 24 hours) at 07/13/2021 1358 Last data filed at 07/13/2021 1109 Gross per 24 hour  Intake 600 ml  Output 1150 ml  Net -550 ml   Filed Weights   07/10/21 1513  Weight: 117.9 kg   Gen: 65 y.o. female in no distress, appears older than stated age, morbidly obese. Pulm: Nonlabored breathing room air. Clear. CV: Regular rate and rhythm. No murmur, rub, or gallop. No JVD, no pitting dependent edema. GI: Abdomen soft, non-tender, non-distended, with normoactive bowel sounds.  Ext: Warm, no deformities Skin: No new rashes, lesions or ulcers on visualized skin. Neuro: Alert, interactive, cognitively very slowed, waxing/waning delirium between my exams, incompletely oriented this morning. Can move all extremities.   Psych: Judgement and insight appear impaired. Mood euthymic & affect congruent. Behavior is appropriate.    Data Reviewed: I have personally reviewed following labs and imaging studies  CBC: Recent Labs  Lab 07/10/21 1740 07/10/21 1837 07/11/21 0400  WBC 10.6*  --  11.5*   NEUTROABS 8.3*  --   --   HGB 14.4 13.9  14.3 14.3  HCT 43.9 41.0  42.0 42.8  MCV 94.0  --  90.7  PLT 237  --  401   Basic Metabolic Panel: Recent Labs  Lab 07/10/21 1740 07/10/21 1837 07/11/21 0400 07/11/21 0942 07/12/21 0851 07/13/21 0241  NA 131* 130*  129* 135 135 134* 136  K 3.4* 6.7*  6.7* 3.0* 3.0* 2.9* 3.4*  CL 96* 97* 101 102 101 104  CO2 20*  --  24 23 23 23   GLUCOSE 487* 484* 202* 167* 236* 128*  BUN 28* 36* 21 19 11 13   CREATININE 1.95* 1.90* 1.46* 1.28* 1.11* 1.15*  CALCIUM 9.2  --  9.1 9.1 8.6* 9.0  MG  --   --  2.1  --  1.7  --   PHOS  --   --  2.4*  --   --   --    GFR: Estimated Creatinine Clearance: 59.7 mL/min (A) (by C-G formula based on SCr of 1.15 mg/dL (H)). Liver Function Tests: Recent Labs  Lab 07/10/21 2000 07/11/21 0400  AST 12* 19  ALT 9 11  ALKPHOS 45 56  BILITOT 0.7 0.9  PROT 4.8* 6.7  ALBUMIN 1.9* 2.6*   No results for input(s): LIPASE, AMYLASE in the last 168 hours. Recent Labs  Lab 07/10/21 1740  AMMONIA 32   Coagulation Profile: No results for input(s): INR, PROTIME in the last 168 hours. Cardiac Enzymes: Recent Labs  Lab 07/10/21 1923  CKTOTAL 72   BNP (last 3 results) No results for input(s): PROBNP in the last 8760 hours. HbA1C: Recent Labs    07/11/21 0400  HGBA1C 12.2*   CBG: Recent Labs  Lab 07/12/21 2322 07/13/21 0522 07/13/21 0745 07/13/21 0909 07/13/21 1153  GLUCAP 197* 108* 118* 125* 229*   Lipid Profile: No results for input(s): CHOL, HDL, LDLCALC, TRIG, CHOLHDL, LDLDIRECT in the last 72 hours. Thyroid Function Tests: Recent Labs    07/10/21 1834 07/11/21 0416  TSH 0.235*  --   FREET4  --  1.39*   Anemia Panel: No results for input(s): VITAMINB12, FOLATE, FERRITIN, TIBC, IRON, RETICCTPCT in the last 72 hours. Urine analysis:    Component Value Date/Time   COLORURINE YELLOW 07/10/2021 2014   APPEARANCEUR HAZY (A) 07/10/2021 2014   LABSPEC 1.022 07/10/2021 2014   PHURINE 5.0  07/10/2021 2014   GLUCOSEU >=500 (A) 07/10/2021 2014   HGBUR LARGE (A) 07/10/2021 2014   HGBUR large 04/03/2010 [place] NEGATIVE 07/10/2021 2014   KETONESUR 5 (A) 07/10/2021 2014   PROTEINUR 30 (A) 07/10/2021 2014   UROBILINOGEN 0.2 03/22/2013 1159   NITRITE NEGATIVE 07/10/2021 2014   LEUKOCYTESUR MODERATE (A) 07/10/2021 2014   Recent Results (from the past 240 hour(s))  Resp Panel by RT-PCR (Flu A&B, Covid) Nasopharyngeal Swab     Status: None   Collection Time: 07/10/21  3:22 PM   Specimen: Nasopharyngeal Swab; Nasopharyngeal(NP) swabs in vial transport medium  Result Value Ref Range Status   SARS Coronavirus 2 by RT PCR NEGATIVE NEGATIVE Final    Comment: (NOTE) SARS-CoV-2 target nucleic acids are NOT DETECTED.  The SARS-CoV-2 RNA is generally detectable in upper respiratory specimens during the acute phase of infection. The lowest concentration of SARS-CoV-2 viral copies this assay can detect is 138 copies/mL. A negative result does not preclude SARS-Cov-2 infection and should not be used as the sole basis for treatment or other patient management decisions. A negative result may occur with  improper specimen collection/handling, submission of specimen other than nasopharyngeal swab, presence of viral mutation(s) within the areas targeted by this assay, and inadequate number of viral copies(<138 copies/mL). A negative result must be combined with clinical observations, patient history, and epidemiological information. The expected result is Negative.  Fact Sheet for Patients:  EntrepreneurPulse.com.au  Fact Sheet for Healthcare Providers:  IncredibleEmployment.be  This test is no t yet approved or cleared by the [place] FDA and  has been authorized for detection and/or diagnosis of SARS-CoV-2 by FDA under an Emergency Use Authorization (EUA). This EUA will remain  in effect (meaning this test can be used) for the  duration of the COVID-19 declaration under Section 564(b)(1) of the Act, 21 U.S.C.section 360bbb-3(b)(1), unless the authorization is terminated  or revoked sooner.       Influenza A by PCR NEGATIVE NEGATIVE Final   Influenza B by PCR NEGATIVE NEGATIVE Final    Comment: (NOTE) The Xpert Xpress SARS-CoV-2/FLU/RSV plus assay is intended as an aid in the diagnosis of influenza from Nasopharyngeal swab specimens and should not be used as a sole basis for treatment. Nasal washings and aspirates are unacceptable for Xpert Xpress SARS-CoV-2/FLU/RSV testing.  Fact Sheet for  Patients: EntrepreneurPulse.com.au  Fact Sheet for Healthcare Providers: IncredibleEmployment.be  This test is not yet approved or cleared by the [place] FDA and has been authorized for detection and/or diagnosis of SARS-CoV-2 by FDA under an Emergency Use Authorization (EUA). This EUA will remain in effect (meaning this test can be used) for the duration of the COVID-19 declaration under Section 564(b)(1) of the Act, 21 U.S.C. section 360bbb-3(b)(1), unless the authorization is terminated or revoked.  Performed at [facility], [place] [address] [place], [place] [address]       Radiology Studies: No results found.  Scheduled Meds:  acetaZOLAMIDE ER  500 mg Oral BID   albuterol  10 mg Nebulization Once   aspirin EC  81 mg Oral Daily   atorvastatin  80 mg Oral Daily   diltiazem  240 mg Oral Daily   enoxaparin (LOVENOX) injection  55 mg Subcutaneous Q24H   insulin aspart  0-15 Units Subcutaneous TID WC   insulin aspart  3 Units Subcutaneous TID WC   insulin glargine-yfgn  35 Units Subcutaneous BID   isosorbide mononitrate  120 mg Oral Daily   metoprolol  200 mg Oral Daily   pantoprazole (PROTONIX) IV  40 mg Intravenous Q24H   potassium chloride  40 mEq Oral Daily   Continuous Infusions:     LOS: 2 days   Time spent: 35 minutes.  Patrecia Pour, MD [facility] [email or web address] 07/13/2021, 1:58 PM

## 2021-07-13 NOTE — Progress Notes (Signed)
Occupational Therapy Treatment Patient Details Name: [name] MRN: [id number] DOB: 19-Dec-1955 Today's Date: 07/13/2021   History of present illness Pt is a 65 y.o. female admitted 12/9 after a fall from her wheelchair and with AMS. CBG elevated at 551, has not been compliant with medications. PMH:  HFpEF, hypertension, obesity, hyperlipidemia, T2DM, glaucoma, GERD, CAD, CKD   OT comments  Pt making progress with functional goals. Session focused on bed mobility max A to sit EOB with increased time, effort, verbal and tactile cues to participate in grooming and UB dressing tasks. Pt with Poor sitting balance and required min A to balance/steady. Pt required verbal and physical cues to initiate tasks but was cooperative. Pt pleasantly confused. OT will continue to follow acutely to maximize level of function and safety   Recommendations for follow up therapy are one component of a multi-disciplinary discharge planning process, led by the attending physician.  Recommendations may be updated based on patient status, additional functional criteria and insurance authorization.    Follow Up Recommendations  Skilled nursing-short term rehab (<3 hours/day)    Assistance Recommended at Discharge Frequent or [facility] bed;Other (comment) (hoyer)    Recommendations for Other Services      Precautions / Restrictions Precautions Precautions: Fall;Other (comment) Precaution Comments: impaired vision Restrictions Weight Bearing Restrictions: No       Mobility Bed Mobility Overal bed mobility: Needs Assistance Bed Mobility: Supine to Sit;Sit to Supine     Supine to sit: Max assist;HOB elevated Sit to supine: Total assist   General bed mobility comments: pt required mod verbal and physical cues to initiate reaching across midline for bedrail; max A to elevate trunk and for LEs back onto bed. Max A to scoot to [name] wiiht use of  bed controls    Transfers                   General transfer comment: unable, will most likely need mechanical lift     Balance Overall balance assessment: Needs assistance Sitting-balance support: Bilateral upper extremity supported;Single extremity supported;Feet supported Sitting balance-Leahy Scale: Poor   Postural control: Posterior lean                                 ADL either performed or assessed with clinical judgement   ADL Overall ADL's : Needs assistance/impaired     Grooming: Maximal assistance;Wash/dry hands;Wash/dry face Grooming Details (indicate cue type and reason): hand over hand assist to initiate, mod verbal cues         Upper Body Dressing : Maximal assistance Upper Body Dressing Details (indicate cue type and reason): donning clean gown seated EOB                   General ADL Comments: Limited primarily by cognition    Extremity/Trunk Assessment Upper Extremity Assessment Upper Extremity Assessment: Generalized weakness   Lower Extremity Assessment Lower Extremity Assessment: Defer to PT evaluation   Cervical / Trunk Assessment Cervical / Trunk Assessment: Other exceptions Cervical / Trunk Exceptions: large body habitus    Vision Baseline Vision/History: 1 Wears glasses;3 Glaucoma;4 Cataracts Ability to See in Adequate Light: 2 Moderately impaired Patient Visual Report: No change from baseline     Perception     Praxis      Cognition Arousal/Alertness: Awake/alert;Lethargic Behavior During Therapy: Flat affect Overall Cognitive Status: No family/caregiver present to determine  baseline cognitive functioning Area of Impairment: Orientation;Attention;Memory;Following commands;Safety/judgement;Awareness;Problem solving                 Orientation Level: Disoriented to;Place;Time;Situation   Memory: Decreased short-term memory Following Commands: Follows one step commands  inconsistently Safety/Judgement: Decreased awareness of safety;Decreased awareness of deficits   Problem Solving: Slow processing;Decreased initiation;Requires verbal cues;Difficulty sequencing;Requires tactile cues General Comments: answering questions inconsistently or after being asked repetitively          Exercises     Shoulder Instructions       General Comments      Pertinent Vitals/ Pain       Pain Assessment: No/denies pain Faces Pain Scale: No hurt Pain Intervention(s): Monitored during session;Repositioned  Home Living                                          Prior Functioning/Environment              Frequency  Min 2X/week        Progress Toward Goals  OT Goals(current goals can now be found in the care plan section)  Progress towards OT goals: Progressing toward goals     Plan Discharge plan remains appropriate    Co-evaluation                 AM-PAC OT "6 Clicks" Daily Activity     Outcome Measure   Help from another person eating meals?: A Little Help from another person taking care of personal grooming?: A Lot Help from another person toileting, which includes using toliet, bedpan, or urinal?: Total Help from another person bathing (including washing, rinsing, drying)?: Total Help from another person to put on and taking off regular upper body clothing?: A Lot Help from another person to put on and taking off regular lower body clothing?: Total 6 Click Score: 10    End of Session    OT Visit Diagnosis: Unsteadiness on feet (R26.81);Other abnormalities of gait and mobility (R26.89);Muscle weakness (generalized) (M62.81);Other symptoms and signs involving cognitive function   Activity Tolerance Other (comment) (limited by cognition)   Patient Left in bed;with call bell/phone within reach;with bed alarm set   Nurse Communication          Time: 7741-2878 OT Time Calculation (min): 25 min  Charges: OT  General Charges $OT Visit: 1 Visit OT Treatments $Self Care/Home Management : 8-22 mins $Therapeutic Activity: 8-22 mins    Britt Bottom 07/13/2021, 1:25 PM

## 2021-07-14 ENCOUNTER — Encounter (HOSPITAL_COMMUNITY): Payer: Self-pay | Admitting: Family Medicine

## 2021-07-14 LAB — GLUCOSE, CAPILLARY
Glucose-Capillary: 108 mg/dL — ABNORMAL HIGH (ref 70–99)
Glucose-Capillary: 102 mg/dL — ABNORMAL HIGH (ref 70–99)
Glucose-Capillary: 188 mg/dL — ABNORMAL HIGH (ref 70–99)
Glucose-Capillary: 182 mg/dL — ABNORMAL HIGH (ref 70–99)
Glucose-Capillary: 145 mg/dL — ABNORMAL HIGH (ref 70–99)

## 2021-07-14 LAB — BASIC METABOLIC PANEL
Anion gap: 14 (ref 5–15)
BUN: 15 mg/dL (ref 8–23)
CO2: 16 mmol/L — ABNORMAL LOW (ref 22–32)
Calcium: 8.7 mg/dL — ABNORMAL LOW (ref 8.9–10.3)
Chloride: 102 mmol/L (ref 98–111)
Creatinine, Ser: 1.12 mg/dL — ABNORMAL HIGH (ref 0.44–1.00)
GFR, Estimated: 54 mL/min — ABNORMAL LOW (ref >60)
Glucose, Bld: 114 mg/dL — ABNORMAL HIGH (ref 70–99)
Potassium: 4.2 mmol/L (ref 3.5–5.1)
Sodium: 132 mmol/L — ABNORMAL LOW (ref 135–145)

## 2021-07-14 LAB — THYROID STIMULATING IMMUNOGLOBULIN: Thyroid Stimulating Immunoglob: <0.1 IU/L (ref 0.00–0.55)

## 2021-07-14 LAB — VITAMIN B12: Vitamin B-12: 485 pg/mL (ref 180–914)

## 2021-07-14 LAB — T3, FREE: T3, Free: 2 pg/mL (ref 2.0–4.4)

## 2021-07-14 MED ORDER — ACETAMINOPHEN 325 MG PO TABS
650.0000 mg | ORAL_TABLET | ORAL | Status: DC | PRN
  Administered 2021-07-14 – 2021-07-17 (×4): 650 mg via ORAL
  Filled 2021-07-14 (×4): qty 2

## 2021-07-14 NOTE — Progress Notes (Signed)
Physical Therapy Treatment Patient Details Name: [name] MRN: [id number] DOB: Oct 11, 1955 Today's Date: 07/14/2021   History of Present Illness Pt is a 65 y.o. female admitted 07/10/21 with AMS after recurrent falls from w/c. CBG elevated at 551, has not been compliant with medications. Workup for hyperrosmolar hyperglycemic state and mild DKA. PMH includes HFpEF, HTN, obesity, HLD, T2DM, glaucoma, GERD, CAD, CKD.   PT Comments    Pt slowly progressing with mobility. Today's session focused on sitting tolerance and bed mobility; pt requiring up to maxA+1-2 for limited mobility. Pt remains pleasantly confused, following some simple commands inconsistently. Continue to recommend SNF-level therapies to maximize functional mobility and decrease caregiver burden.    Recommendations for follow up therapy are one component of a multi-disciplinary discharge planning process, led by the attending physician.  Recommendations may be updated based on patient status, additional functional criteria and insurance authorization.  Follow Up Recommendations  Skilled nursing-short term rehab (<3 hours/day)     Assistance Recommended at Discharge Frequent or [facility] bed (hoyer lift)    Recommendations for Other Services       Precautions / Restrictions Precautions Precautions: Fall;Other (comment) Precaution Comments: urine incontinence, impaired vision Restrictions Weight Bearing Restrictions: No     Mobility  Bed Mobility Overal bed mobility: Needs Assistance Bed Mobility: Rolling;Supine to Sit;Sit to Supine Rolling: Max assist;Total assist;+2 for physical assistance;+2 for safety/equipment   Supine to sit: Max assist;HOB elevated Sit to supine: Max assist   General bed mobility comments: Pt initiating BLE movement to EOB well, max cues for UE assist on bed rails, maxA for trunk elevation and scooting hips to EOB; pt  returning to sidelying without warning after prolonged sitting EOB, maxA for LE management and trunk repositioning; pt inconsistently engaging core and RUE/BLEs to assist with mobility; maxA+2 to totalA for rolling R/L for pericare; pt able to assist with UE support on rail and bending bilateral knees when asked to scoot up in bed    Transfers                   General transfer comment: limited by fatigue after sitting EOB; will likely require maximove lift OOB    Ambulation/Gait                   Stairs             Wheelchair Mobility    Modified Rankin (Stroke Patients Only)       Balance Overall balance assessment: Needs assistance Sitting-balance support: Bilateral upper extremity supported;Single extremity supported;Feet supported Sitting balance-Leahy Scale: Poor Sitting balance - Comments: Reliant on at least single UE support and min-maxA to maintain static sitting balance; pt at times returning to partial sidelying propping on R elbow requiring assist to return to sitting Postural control: Right lateral lean;Posterior lean                                  Cognition Arousal/Alertness: Awake/alert Behavior During Therapy: Flat affect Overall Cognitive Status: No family/caregiver present to determine baseline cognitive functioning Area of Impairment: Orientation;Attention;Memory;Following commands;Safety/judgement;Awareness;Problem solving                 Orientation Level: Disoriented to;Place;Time;Situation Current Attention Level: Focused Memory: Decreased short-term memory Following Commands: Follows one step commands inconsistently;Follows one step commands with increased time Safety/Judgement: Decreased awareness of safety;Decreased awareness of deficits Awareness:  Intellectual Problem Solving: Slow processing;Decreased initiation;Requires verbal cues;Difficulty sequencing;Requires tactile cues General Comments: Pt  pleasantly confused; answering "okay" to majority of questions initially, then laughing at times and able to state husband's name; perseverates on cue given (i.e. hold this rail - continues to attempt to hold rail despite moving on to next task with multiple cues to stop holding rail)        Exercises      General Comments General comments (skin integrity, edema, etc.): pt dependent for pericare from urine incontinence; pt aware she was wet but unable to call for assist. pt requires maxA for meal tray set up, then able to initiate feeding herself; pt left it modified chair position to eat      Pertinent Vitals/Pain Pain Assessment: Faces Faces Pain Scale: No hurt Pain Intervention(s): Monitored during session    Home Living                          Prior Function            PT Goals (current goals can now be found in the care plan section) Acute Rehab PT Goals Patient Stated Goal: get cleaned up PT Goal Formulation: With patient Progress towards PT goals: Progressing toward goals (slowly)    Frequency    Min 2X/week      PT Plan Current plan remains appropriate    Co-evaluation              AM-PAC PT "6 Clicks" Mobility   Outcome Measure  Help needed turning from your back to your side while in a flat bed without using bedrails?: Total Help needed moving from lying on your back to sitting on the side of a flat bed without using bedrails?: Total Help needed moving to and from a bed to a chair (including a wheelchair)?: Total Help needed standing up from a chair using your arms (e.g., wheelchair or bedside chair)?: Total Help needed to walk in hospital room?: Total Help needed climbing 3-5 steps with a railing? : Total 6 Click Score: 6    End of Session   Activity Tolerance: Patient tolerated treatment well;Patient limited by fatigue Patient left: in bed;with call bell/phone within reach;with bed alarm set;with nursing/sitter in room Nurse  Communication: Mobility status;Need for lift equipment PT Visit Diagnosis: Other abnormalities of gait and mobility (R26.89);Muscle weakness (generalized) (M62.81)     Time: 3825-0539 PT Time Calculation (min) (ACUTE ONLY): 17 min  Charges:  $Therapeutic Activity: 8-22 mins                     Mabeline Caras, PT, DPT Acute Rehabilitation Services  Pager [phone] Office [phone]  Derry Lory 07/14/2021, 12:26 PM

## 2021-07-14 NOTE — Care Management Important Message (Signed)
Important Message  Patient Details  Name: [name] MRN: [id number] Date of Birth: 04-22-1956   Medicare Important Message Given:  Yes     [name] 07/14/2021, 11:09 AM

## 2021-07-14 NOTE — Progress Notes (Signed)
PROGRESS NOTE  [name]  ZOX:[id number] DOB: 10-12-1955 DOA: 07/10/2021 PCP: Ladell Pier, MD   Brief Narrative: [name] is a 65 y.o. female with a history of morbid obesity, T2DM, CAD, HFpEF, HTN, HLD, CKD IIIb who was brought to the ED 12/9 by EMS from home due to recurrently slipping out of her wheelchair found to be severely hyperglycemic and progressively confused/somnolent. Hyperglycemia, hyperkalemia were noted for which IV fluids and IV insulin were started. Metabolic parameters have improved and patient's level of alertness has improved as well. SNF rehabilitation is recommended and is being pursued.  Assessment & Plan: Principal Problem:   Hyperosmolar hyperglycemic state (HHS) ([place]) Active Problems:   Prolonged Q-T interval on ECG   Hypokalemia   High anion gap metabolic acidosis   Dehydration   Pseudohyponatremia   Acute kidney injury superimposed on CKD (HCC)   Hypoalbuminemia due to protein-calorie malnutrition (HCC)   Low TSH level   Noncompliance with medication regimen   Altered mental status   Acute metabolic encephalopathy  HHS and mild DKA in IDT2DM with diabetic neuropathy, retinopathy, nephropathy: Decompensation reportedly due to nonadherence to medications, though chronically uncontrolled as well with HbA1c 12.2% (avg CBG ~300mg /dl). - Came off IV insulin. Home 75/25 insulin reportedly 54u BID. Titrated rapidly upward to glargine 35u BID and novolog 3u TIDWC + moderate SSI with good control and no hypoglycemia. Continue this while admitted.  Chronic HFpEF, HTN:   - Restarted home acetazolamide, metoprolol 200mg  daily, imdur, diltiazem 240mg . Reports not taking losartan 100mg  daily, lasix 40mg  po BID, so holding for now. Appears euvolemic.  CAD: No chest pain reported, no ST elevations. Diffuse 3VD (RCA, LCx, D1) by LHC remotely. Tn wnl. - Restarted ASA, statin, BB, imdur 120mg  daily  AKI on stage IIIb CKD: SCr stabilized.  Iron  deficiency anemia due to chronic blood loss due to uterine bleeding: Hysterectomy reportedly recommended. Hgb currently normal. Vitamin B12 level is wnl. - Megace on med list. Not continued since not actively bleeding.  Weakness, falls:  - PT/OT consulted. SNF recommended. TOC consulted, family amenable. Insurance authorization begun. - Optimize medically prior to discharge  Acute metabolic encephalopathy: Due to HHS. Negative head CT. Has shown some improvement, now with what may be acute delirium. MR brain without restricted diffusion or other causative abnormalities. - Delirium precautions.   UTI:  - Continue ceftriaxone pending urine culture.  Suppressed TSH: Also had low level earlier this year during an admission. No signs (exophthalmos, palpable goiter) or symptoms (weight loss, heat intolerance, anxiety, tremor, palpitations) of hyperthyroidism at this time. Does not appear to be taking biotin. - Free T4 is elevated, though free T3 is normal. TSI is negative. Recommend rechecking outside scope of acute illness.   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 - Follow up with Dr. Denman George for this and bleeding  Morbid obesity: Estimated body mass index is 46.06 kg/m as calculated from the following:   Height as of this encounter: 5\' 3"  (1.6 m).   Weight as of this encounter: 117.9 kg.  Moderate protein calorie malnutrition:  - Supplement protein as able  Glaucoma: With retinopathy leads to very poor visual acuity.  - Continue gtt's  HLD:  - Continue statin when taking po  GERD:  - Continue PPI when taking po  OSA: Per chart review diagnosed in 2013, not on CPAP.   Prolonged QT interval:  - Continue  cardiac monitoring and optimizing electrolytes.   Hypokalemia: - Supplement again today, recheck in AM.   DVT prophylaxis: Lovenox 0.5mg /kg q24h since no active bleeding noted Code Status: Full Family  Communication: None at bedside. Spoke with spouse by phone previously. Disposition Plan:  Status is: Inpatient, unsafe DC, requires SNF at discharge.  Consultants:  None  Procedures:  None  Antimicrobials: Ceftriaxone 12/10 - 12/12   Subjective: Knows she lives in [place], doesn't know where she is or why. Eating better, and working with physical therapy. No other complaints.   Objective: Vitals:   07/13/21 2354 07/14/21 0404 07/14/21 1042 07/14/21 1132  BP: 137/78 133/81 (!) 164/77 (!) 131/98  Pulse: 75 68 70 77  Resp: 15 16  19   Temp: 98.2 F (36.8 C) 98.3 F (36.8 C)  98 F (36.7 C)  TempSrc: Oral Oral  Oral  SpO2: 100% 100%  99%  Weight:      Height:        Intake/Output Summary (Last 24 hours) at 07/14/2021 1503 Last data filed at 07/14/2021 1100 Gross per 24 hour  Intake 60 ml  Output 1650 ml  Net -1590 ml   Filed Weights   07/10/21 1513  Weight: 117.9 kg   Gen: 65 y.o. female in no distress. Obese and appearing older than stated age. Pulm: Nonlabored breathing room air. Clear. CV: Regular rate and rhythm. No murmur, rub, or gallop. No definite JVD, no pitting dependent edema. GI: Abdomen soft, non-tender, non-distended, with normoactive bowel sounds.  Ext: Warm, no deformities Skin: No new rashes, lesions or ulcers on visualized skin. Neuro: Alert and disoriented, pleasantly and interactively so. No focal neurological deficits. Psych: Judgement and insight appear impaired. Mood euthymic & affect congruent.    Data Reviewed: I have personally reviewed following labs and imaging studies  CBC: Recent Labs  Lab 07/10/21 1740 07/10/21 1837 07/11/21 0400  WBC 10.6*  --  11.5*  NEUTROABS 8.3*  --   --   HGB 14.4 13.9   14.3 14.3  HCT 43.9 41.0   42.0 42.8  MCV 94.0  --  90.7  PLT 237  --  454   Basic Metabolic Panel: Recent Labs  Lab 07/11/21 0400 07/11/21 0942 07/12/21 0851 07/13/21 0241 07/14/21 0208  NA 135 135 134* 136 132*  K 3.0*  3.0* 2.9* 3.4* 4.2  CL 101 102 101 104 102  CO2 24 23 23 23  16*  GLUCOSE 202* 167* 236* 128* 114*  BUN 21 19 11 13 15   CREATININE 1.46* 1.28* 1.11* 1.15* 1.12*  CALCIUM 9.1 9.1 8.6* 9.0 8.7*  MG 2.1  --  1.7  --   --   PHOS 2.4*  --   --   --   --    GFR: Estimated Creatinine Clearance: 61.3 mL/min (A) (by C-G formula based on SCr of 1.12 mg/dL (H)). Liver Function Tests: Recent Labs  Lab 07/10/21 2000 07/11/21 0400  AST 12* 19  ALT 9 11  ALKPHOS 45 56  BILITOT 0.7 0.9  PROT 4.8* 6.7  ALBUMIN 1.9* 2.6*   No results for input(s): LIPASE, AMYLASE in the last 168 hours. Recent Labs  Lab 07/10/21 1740  AMMONIA 32   Coagulation Profile: No results for input(s): INR, PROTIME in the last 168 hours. Cardiac Enzymes: Recent Labs  Lab 07/10/21 1923  CKTOTAL 72   BNP (last 3 results) No results for input(s): PROBNP in the last 8760 hours. HbA1C: No results for input(s): HGBA1C in the last  72 hours.  CBG: Recent Labs  Lab 07/13/21 2007 07/13/21 2354 07/14/21 0403 07/14/21 0757 07/14/21 1243  GLUCAP 215* 170* 108* 102* 188*   Lipid Profile: No results for input(s): CHOL, HDL, LDLCALC, TRIG, CHOLHDL, LDLDIRECT in the last 72 hours. Thyroid Function Tests: Recent Labs    07/13/21 0241  T3FREE 2.0   Anemia Panel: Recent Labs    07/14/21 0208  VITAMINB12 485   Urine analysis:    Component Value Date/Time   COLORURINE YELLOW 07/10/2021 2014   APPEARANCEUR HAZY (A) 07/10/2021 2014   LABSPEC 1.022 07/10/2021 2014   PHURINE 5.0 07/10/2021 2014   GLUCOSEU >=500 (A) 07/10/2021 2014   HGBUR LARGE (A) 07/10/2021 2014   HGBUR large 04/03/2010 [place] NEGATIVE 07/10/2021 2014   KETONESUR 5 (A) 07/10/2021 2014   PROTEINUR 30 (A) 07/10/2021 2014   UROBILINOGEN 0.2 03/22/2013 1159   NITRITE NEGATIVE 07/10/2021 2014   LEUKOCYTESUR MODERATE (A) 07/10/2021 2014   Recent Results (from the past 240 hour(s))  Resp Panel by RT-PCR (Flu A&B, Covid)  Nasopharyngeal Swab     Status: None   Collection Time: 07/10/21  3:22 PM   Specimen: Nasopharyngeal Swab; Nasopharyngeal(NP) swabs in vial transport medium  Result Value Ref Range Status   SARS Coronavirus 2 by RT PCR NEGATIVE NEGATIVE Final    Comment: (NOTE) SARS-CoV-2 target nucleic acids are NOT DETECTED.  The SARS-CoV-2 RNA is generally detectable in upper respiratory specimens during the acute phase of infection. The lowest concentration of SARS-CoV-2 viral copies this assay can detect is 138 copies/mL. A negative result does not preclude SARS-Cov-2 infection and should not be used as the sole basis for treatment or other patient management decisions. A negative result may occur with  improper specimen collection/handling, submission of specimen other than nasopharyngeal swab, presence of viral mutation(s) within the areas targeted by this assay, and inadequate number of viral copies(<138 copies/mL). A negative result must be combined with clinical observations, patient history, and epidemiological information. The expected result is Negative.  Fact Sheet for Patients:  EntrepreneurPulse.com.au  Fact Sheet for Healthcare Providers:  IncredibleEmployment.be  This test is no t yet approved or cleared by the [place] FDA and  has been authorized for detection and/or diagnosis of SARS-CoV-2 by FDA under an Emergency Use Authorization (EUA). This EUA will remain  in effect (meaning this test can be used) for the duration of the COVID-19 declaration under Section 564(b)(1) of the Act, 21 U.S.C.section 360bbb-3(b)(1), unless the authorization is terminated  or revoked sooner.       Influenza A by PCR NEGATIVE NEGATIVE Final   Influenza B by PCR NEGATIVE NEGATIVE Final    Comment: (NOTE) The Xpert Xpress SARS-CoV-2/FLU/RSV plus assay is intended as an aid in the diagnosis of influenza from Nasopharyngeal swab specimens and should not be  used as a sole basis for treatment. Nasal washings and aspirates are unacceptable for Xpert Xpress SARS-CoV-2/FLU/RSV testing.  Fact Sheet for Patients: EntrepreneurPulse.com.au  Fact Sheet for Healthcare Providers: IncredibleEmployment.be  This test is not yet approved or cleared by the [place] FDA and has been authorized for detection and/or diagnosis of SARS-CoV-2 by FDA under an Emergency Use Authorization (EUA). This EUA will remain in effect (meaning this test can be used) for the duration of the COVID-19 declaration under Section 564(b)(1) of the Act, 21 U.S.C. section 360bbb-3(b)(1), unless the authorization is terminated or revoked.  Performed at [facility], [place] [address] [place], [place] [address]  Radiology Studies: MR BRAIN WO CONTRAST  Result Date: 07/13/2021 CLINICAL DATA:  Mental status change of unknown cause. Frequent falling. EXAM: MRI HEAD WITHOUT CONTRAST TECHNIQUE: Multiplanar, multiecho pulse sequences of the brain and surrounding structures were obtained without intravenous contrast. COMPARISON:  Head CT 07/10/2021 FINDINGS: Brain: The study suffers from considerable motion degradation. Diffusion imaging is of satisfactory quality and does not show any acute or subacute infarction. There is generalized brain atrophy. 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 No sign of large vessel infarction. No mass lesion, hemorrhage, hydrocephalus or extra-axial collection. Vascular: Major vessels at the base of the brain show flow. Skull and upper cervical spine: Negative Sinuses/Orbits: No sinus disease identified. Orbits appear negative as seen. Other: None IMPRESSION: Motion degraded abbreviated examination. Diffusion imaging is of satisfactory quality and does not show any acute or subacute infarction or other cause of restricted diffusion. Elsewhere, there is atrophy in there  are old ischemic changes in the thalami, basal ganglia and hemispheric white matter. Electronically Signed   By: Nelson Chimes M.D.   On: 07/13/2021 17:50    Scheduled Meds:  acetaZOLAMIDE ER  500 mg Oral BID   albuterol  10 mg Nebulization Once   aspirin EC  81 mg Oral Daily   atorvastatin  80 mg Oral Daily   diltiazem  240 mg Oral Daily   enoxaparin (LOVENOX) injection  55 mg Subcutaneous Q24H   insulin aspart  0-15 Units Subcutaneous TID WC   insulin aspart  3 Units Subcutaneous TID WC   insulin glargine-yfgn  35 Units Subcutaneous BID   isosorbide mononitrate  120 mg Oral Daily   metoprolol  200 mg Oral Daily   pantoprazole  40 mg Oral Daily   Continuous Infusions:     LOS: 3 days   Time spent: 35 minutes.  Patrecia Pour, MD [facility] [email or web address] 07/14/2021, 3:03 PM

## 2021-07-14 NOTE — Consult Note (Signed)
° °  [facility] CM Inpatient Consult   07/14/2021  ZOA DOWTY [birth year] [id number]  [facility] [ACO] Patient: [name] HMO  Primary Care Provider:  Ladell Pier, MD, [facility] and [facility]   Patient is currently active with [facility] for chronic disease management services.  Patient has been engaged by a [facility] Coordinator, however, notes reviewed shows some difficulty maintaining contact.  Plan: Will continue to follow up with patient who was off the unit.  Will update Inpatient Transition Of Care [TOC] team member to make aware that [facility] following.  Of note, [facility] Care Management services does not replace or interfere with any services that are needed or arranged by inpatient [facility] care management team.  For additional questions or referrals please contact:   Natividad Brood, RN BSN [facility] Liaison  [phone] business mobile phone Toll free office [phone]  Fax number: [phone] [name]

## 2021-07-14 NOTE — TOC Progression Note (Signed)
Transition of Care [facility]) - Progression Note    Patient Details  Name: [name] MRN: [id number] Date of Birth: [birth year]  Transition of Care [facility]) CM/SW [place], [place] Phone Number: 07/14/2021, 9:47 AM  Clinical Narrative:     Sent message to [facility]- needs to confirmed bed availability- waiting on response  Thurmond Butts, MSW, LCSW Clinical Social Worker     Expected Discharge Plan: Skilled Nursing Facility Barriers to Discharge: Continued Medical Work up, Ship broker, SNF Pending bed offer  Expected Discharge Plan and Services Expected Discharge Plan: [place]                                               Social Determinants of Health (SDOH) Interventions    Readmission Risk Interventions No flowsheet data found.

## 2021-07-14 NOTE — TOC Progression Note (Signed)
Transition of Care [facility]) - Progression Note    Patient Details  Name: RAMSEY MIDGETT MRN: [id number] Date of Birth: 1955/10/18  Transition of Care [facility]) CM/SW [place], [place] Phone Number: 07/14/2021, 3:56 PM  Clinical Narrative:    [facility] - reports no availability, they may have availability tomorrow- CSW will follow up.   CSW called patient's sister- left voice message to return call. CSW will need another SNF choice if [facility] unable to accept.  CSW will continue to follow and assist with discharge planning.  Thurmond Butts, MSW, LCSW Clinical Social Worker    Expected Discharge Plan: Skilled Nursing Facility Barriers to Discharge: Continued Medical Work up, Ship broker, SNF Pending bed offer  Expected Discharge Plan and Services Expected Discharge Plan: [place]                                               Social Determinants of Health (SDOH) Interventions    Readmission Risk Interventions No flowsheet data found.

## 2021-07-15 DIAGNOSIS — E1100 Type 2 diabetes mellitus with hyperosmolarity without nonketotic hyperglycemic-hyperosmolar coma (NKHHC): Secondary | ICD-10-CM

## 2021-07-15 DIAGNOSIS — N179 Acute kidney failure, unspecified: Secondary | ICD-10-CM

## 2021-07-15 DIAGNOSIS — G9341 Metabolic encephalopathy: Secondary | ICD-10-CM

## 2021-07-15 DIAGNOSIS — N189 Chronic kidney disease, unspecified: Secondary | ICD-10-CM

## 2021-07-15 LAB — GLUCOSE, CAPILLARY
Glucose-Capillary: 117 mg/dL — ABNORMAL HIGH (ref 70–99)
Glucose-Capillary: 128 mg/dL — ABNORMAL HIGH (ref 70–99)
Glucose-Capillary: 143 mg/dL — ABNORMAL HIGH (ref 70–99)
Glucose-Capillary: 158 mg/dL — ABNORMAL HIGH (ref 70–99)
Glucose-Capillary: 180 mg/dL — ABNORMAL HIGH (ref 70–99)
Glucose-Capillary: 187 mg/dL — ABNORMAL HIGH (ref 70–99)
Glucose-Capillary: 58 mg/dL — ABNORMAL LOW (ref 70–99)
Glucose-Capillary: 63 mg/dL — ABNORMAL LOW (ref 70–99)
Glucose-Capillary: 67 mg/dL — ABNORMAL LOW (ref 70–99)

## 2021-07-15 LAB — BASIC METABOLIC PANEL
Anion gap: 10 (ref 5–15)
BUN: 18 mg/dL (ref 8–23)
CO2: 15 mmol/L — ABNORMAL LOW (ref 22–32)
Calcium: 8.5 mg/dL — ABNORMAL LOW (ref 8.9–10.3)
Chloride: 105 mmol/L (ref 98–111)
Creatinine, Ser: 1.47 mg/dL — ABNORMAL HIGH (ref 0.44–1.00)
GFR, Estimated: 39 mL/min — ABNORMAL LOW (ref >60)
Glucose, Bld: 83 mg/dL (ref 70–99)
Potassium: 3.4 mmol/L — ABNORMAL LOW (ref 3.5–5.1)
Sodium: 130 mmol/L — ABNORMAL LOW (ref 135–145)

## 2021-07-15 MED ORDER — DEXTROSE 50 % IV SOLN
1.0000 | Freq: Once | INTRAVENOUS | Status: AC
  Administered 2021-07-15: 08:00:00 50 mL via INTRAVENOUS
  Filled 2021-07-15: qty 50

## 2021-07-15 MED ORDER — POTASSIUM CHLORIDE CRYS ER 20 MEQ PO TBCR
40.0000 meq | EXTENDED_RELEASE_TABLET | Freq: Once | ORAL | Status: AC
  Administered 2021-07-15: 14:00:00 40 meq via ORAL
  Filled 2021-07-15: qty 2

## 2021-07-15 MED ORDER — INSULIN GLARGINE-YFGN 100 UNIT/ML ~~LOC~~ SOLN
30.0000 [IU] | Freq: Two times a day (BID) | SUBCUTANEOUS | Status: DC
  Administered 2021-07-15: 21:00:00 30 [IU] via SUBCUTANEOUS
  Filled 2021-07-15 (×3): qty 0.3

## 2021-07-15 NOTE — Progress Notes (Signed)
Occupational Therapy Treatment Patient Details Name: [name] MRN: [id number] DOB: 12/11/1955 Today's Date: 07/15/2021   History of present illness Pt is a 65 y.o. female admitted 07/10/21 with AMS after recurrent falls from w/c. CBG elevated at 551, has not been compliant with medications. Workup for hyperrosmolar hyperglycemic state and mild DKA. PMH includes HFpEF, HTN, obesity, HLD, T2DM, glaucoma, GERD, CAD, CKD.   OT comments  Pt making slow progress with functional goals. Pt pleasantly confused; answering "ok, I'm hungry" to majority of questions. Session focused on sitting EOB, sitting activity tolerance during grooming and UB dressing tasks requiring verbal and hand over hand cues to initiate.  Mod A for balance/support seated EOB,  pushing towards R side to return to supine after sitting approximately 12 minutes. Mod verbal and tactile cues to correct pt to lean to L side to initiate sit - supine transition. OT will continue to follow acutely to maximize level of function and safety   Recommendations for follow up therapy are one component of a multi-disciplinary discharge planning process, led by the attending physician.  Recommendations may be updated based on patient status, additional functional criteria and insurance authorization.    Follow Up Recommendations  Skilled nursing-short term rehab (<3 hours/day)    Assistance Recommended at Discharge Frequent or [facility] bed;Other (comment) (TBD at SNF)    Recommendations for Other Services      Precautions / Restrictions Precautions Precautions: Fall;Other (comment) Precaution Comments: urine incontinence, impaired vision Restrictions Weight Bearing Restrictions: No       Mobility Bed Mobility Overal bed mobility: Needs Assistance Bed Mobility: Rolling;Supine to Sit;Sit to Supine Rolling: Max assist   Supine to sit: Max assist;HOB elevated Sit to  supine: Max assist   General bed mobility comments: max cues to initiate using rails and to bring LEs to EOB , max A with trunk elevation and for LEs back onto bed    Transfers                   General transfer comment: unable due to fatigue and confusion. Pt pushing back to retrun to supine seated EOB after 12 minutes     Balance Overall balance assessment: Needs assistance Sitting-balance support: Bilateral upper extremity supported;Single extremity supported;Feet supported   Sitting balance - Comments: mod A for balance/support seated EOB, pushing towards R side to return to supine after sitting approximately 12 minutes                                   ADL either performed or assessed with clinical judgement   ADL       Grooming: Wash/dry hands;Wash/dry face;Moderate assistance;Sitting Grooming Details (indicate cue type and reason): hand over hand assist to initiate, mod verbal cues         Upper Body Dressing : Moderate assistance;Sitting Upper Body Dressing Details (indicate cue type and reason): donning clean gown seated EOB                   General ADL Comments: Limited primarily by cognition    Extremity/Trunk Assessment Upper Extremity Assessment Upper Extremity Assessment: Generalized weakness   Lower Extremity Assessment Lower Extremity Assessment: Defer to PT evaluation   Cervical / Trunk Assessment Cervical / Trunk Assessment: Other exceptions Cervical / Trunk Exceptions: large body habitus    Vision Baseline Vision/History: 1 Wears glasses;3 Glaucoma;4 Cataracts  Ability to See in Adequate Light: 1 Impaired Patient Visual Report: No change from baseline     Perception     Praxis      Cognition Arousal/Alertness: Awake/alert Behavior During Therapy: Flat affect Overall Cognitive Status: No family/caregiver present to determine baseline cognitive functioning Area of Impairment:  Orientation;Attention;Memory;Following commands;Safety/judgement;Awareness;Problem solving                 Orientation Level: Disoriented to;Place;Time;Situation   Memory: Decreased short-term memory Following Commands: Follows one step commands inconsistently;Follows one step commands with increased time Safety/Judgement: Decreased awareness of safety;Decreased awareness of deficits   Problem Solving: Slow processing;Decreased initiation;Requires verbal cues;Difficulty sequencing;Requires tactile cues General Comments: Pt pleasantly confused; answering "ok, I'm hungry" to majority of questions          Exercises     Shoulder Instructions       General Comments      Pertinent Vitals/ Pain       Pain Assessment: No/denies pain Faces Pain Scale: No hurt Pain Intervention(s): Monitored during session  Home Living                                          Prior Functioning/Environment              Frequency  Min 2X/week        Progress Toward Goals  OT Goals(current goals can now be found in the care plan section)  Progress towards OT goals: Progressing toward goals     Plan Discharge plan remains appropriate;Frequency remains appropriate    Co-evaluation                 AM-PAC OT "6 Clicks" Daily Activity     Outcome Measure   Help from another person eating meals?: None Help from another person taking care of personal grooming?: A Little Help from another person toileting, which includes using toliet, bedpan, or urinal?: Total Help from another person bathing (including washing, rinsing, drying)?: Total Help from another person to put on and taking off regular upper body clothing?: A Lot Help from another person to put on and taking off regular lower body clothing?: Total 6 Click Score: 12    End of Session    OT Visit Diagnosis: Unsteadiness on feet (R26.81);Other abnormalities of gait and mobility (R26.89);Muscle  weakness (generalized) (M62.81);Other symptoms and signs involving cognitive function   Activity Tolerance Other (comment) (limited by cognition)   Patient Left in bed;with call bell/phone within reach;with bed alarm set   Nurse Communication          Time: 8338-2505 OT Time Calculation (min): 18 min  Charges: OT General Charges $OT Visit: 1 Visit OT Treatments $Self Care/Home Management : 8-22 mins    Britt Bottom 07/15/2021, 3:12 PM

## 2021-07-15 NOTE — Progress Notes (Signed)
Inpatient Diabetes Program Recommendations  AACE/ADA: New Consensus Statement on Inpatient Glycemic Control (2015)  Target Ranges:  Prepandial:   less than 140 mg/dL      Peak postprandial:   less than 180 mg/dL (1-2 hours)      Critically ill patients:  140 - 180 mg/dL   Lab Results  Component Value Date   GLUCAP 180 (H) 07/15/2021   HGBA1C 12.2 (H) 07/11/2021    Review of Glycemic Control  Diabetes history: DM2 Outpatient Diabetes medications: Humalog 75/25 54 units BID Current orders for Inpatient glycemic control: Semglee 35 units BID, Novolog 0-15 units TID with meals + 3 units TID  CBGs today 67, 63, 187, 180 Eating 15-30% meals  Inpatient Diabetes Program Recommendations:    Decrease Semglee to 33 units BID  Continue to follow.  Thank you. Lorenda Peck, RD, LDN, CDE Inpatient Diabetes Coordinator [phone]

## 2021-07-15 NOTE — TOC Progression Note (Signed)
Transition of Care [facility]) - Progression Note    Patient Details  Name: [name] MRN: [id number] Date of Birth: April 19, 1956  Transition of Care [facility]) CM/SW [place], [place] Phone Number: 07/15/2021, 3:18 PM  Clinical Narrative:     CSW confirmed with patient's family, Patricia, [facility] is the preferred SNF choice.  CSW confirmed with [facility] they can admit once they have received insurance authorization. SNF will start SNF authorization today.   CSW will continue to follow and assist with discharge planning.  Thurmond Butts, MSW, LCSW Clinical Social Worker     Expected Discharge Plan: Skilled Nursing Facility Barriers to Discharge: Continued Medical Work up, Ship broker, SNF Pending bed offer  Expected Discharge Plan and Services Expected Discharge Plan: [place]                                               Social Determinants of Health (SDOH) Interventions    Readmission Risk Interventions No flowsheet data found.

## 2021-07-15 NOTE — Progress Notes (Signed)
PROGRESS NOTE    FELISHIA WARTMAN  OXB:[id number] DOB: 1956-01-12 DOA: 07/10/2021 PCP: Ladell Pier, MD   Chief Complaint  Patient presents with   Altered Mental Status    Brief Narrative:  [name] is a 65 y.o. female with a history of morbid obesity, T2DM, CAD, HFpEF, HTN, HLD, CKD IIIb who was brought to the ED 12/9 by EMS from home due to recurrently slipping out of her wheelchair found to be severely hyperglycemic and progressively confused/somnolent. Hyperglycemia, hyperkalemia were noted for which IV fluids and IV insulin were started. Metabolic parameters have improved and patient's level of alertness has improved as well. SNF rehabilitation is recommended and is being pursued.   Assessment & Plan:   Principal Problem:   Hyperosmolar hyperglycemic state (HHS) ([place]) Active Problems:   Prolonged Q-T interval on ECG   Hypokalemia   High anion gap metabolic acidosis   Dehydration   Pseudohyponatremia   Acute kidney injury superimposed on CKD (HCC)   Hypoalbuminemia due to protein-calorie malnutrition (HCC)   Low TSH level   Noncompliance with medication regimen   Altered mental status   Acute metabolic encephalopathy  HHS and mild DKA and type 2 diabetes mellitus with diabetic neuropathy, retinopathy and nephropathy Secondary to noncompliance to medications.  Last A1c is 12.2. CBG (last 3)  Recent Labs    07/15/21 0756 07/15/21 0833 07/15/21 1147  GLUCAP 63* 187* 180*   Patient slightly hypoglycemic this morning, decrease the dose of Semglee to 30 units twice daily and continue with sliding scale insulin.    Chronic diastolic heart failure Patient appears to be euvolemic Continue with home dose of acetazolamide, metoprolol, Cardizem and Imdur.     History of coronary artery disease Continue with aspirin, beta-blocker, statin and Imdur.  Patient currently denies any chest pain.     Iron deficiency anemia secondary to chronic blood loss due  to uterine bleeding    Generalized weakness and recurrent falls Therapy evaluations recommending SNF Currently waiting for SNF bed.    Acute metabolic encephalopathy Probably secondary to HHS Further imaging is negative.    Urinary tract infection Unfortunately cultures were not done on admission Continue with Rocephin and transition to oral antibiotic on discharge.   Suppressed TSH with elevated free T4 Recommend checking thyroid panel in about 4 weeks and if the levels are still abnormal start the patient on antithyroid regimen.    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    Morbid obesity/Body mass index is 46.06 kg/m. Increased mortality and morbidity risk  Moderate protein calorie malnutrition:  - on supplements.    OSA:  Not on CPAP.     Hypokalemia: replaced.   DVT prophylaxis:  Code Status: full code.  Family Communication: none at bedside.  Disposition:   Status is: Inpatient  Remains inpatient appropriate because: unsafe d/c plan.        Consultants:    Procedures:   Antimicrobials: None.   Subjective: Appears comfortable.   Objective: Vitals:   07/15/21 0405 07/15/21 0730 07/15/21 0907 07/15/21 1148  BP: 133/66 115/64 127/89 (!) 101/54  Pulse: 65 62 66 61  Resp: 13 16  17   Temp: 98.5 F (36.9 C) 98.4 F (36.9 C)  98 F (36.7 C)  TempSrc: Axillary Oral  Oral  SpO2: 100% 100%  97%  Weight:      Height:        Intake/Output Summary (Last 24 hours) at 07/15/2021 1238  Last data filed at 07/15/2021 1032 Gross per 24 hour  Intake 85 ml  Output 500 ml  Net -415 ml   Filed Weights   07/10/21 1513  Weight: 117.9 kg    Examination:  General exam: Appears calm and comfortable  Respiratory system: Clear to auscultation. Respiratory effort normal. Cardiovascular system: S1 & S2 heard, RRR. No JVD,No pedal edema. Gastrointestinal system: Abdomen is nondistended, soft and  nontender.  Normal bowel sounds heard. Central nervous system: Alert , pleasantly confused.  Extremities: Symmetric 5 x 5 power. Skin: No rashes, lesions or ulcers Psychiatry:  Mood & affect appropriate.     Data Reviewed: I have personally reviewed following labs and imaging studies  CBC: Recent Labs  Lab 07/10/21 1740 07/10/21 1837 07/11/21 0400  WBC 10.6*  --  11.5*  NEUTROABS 8.3*  --   --   HGB 14.4 13.9   14.3 14.3  HCT 43.9 41.0   42.0 42.8  MCV 94.0  --  90.7  PLT 237  --  357    Basic Metabolic Panel: Recent Labs  Lab 07/11/21 0400 07/11/21 0942 07/12/21 0851 07/13/21 0241 07/14/21 0208 07/15/21 0203  NA 135 135 134* 136 132* 130*  K 3.0* 3.0* 2.9* 3.4* 4.2 3.4*  CL 101 102 101 104 102 105  CO2 24 23 23 23  16* 15*  GLUCOSE 202* 167* 236* 128* 114* 83  BUN 21 19 11 13 15 18   CREATININE 1.46* 1.28* 1.11* 1.15* 1.12* 1.47*  CALCIUM 9.1 9.1 8.6* 9.0 8.7* 8.5*  MG 2.1  --  1.7  --   --   --   PHOS 2.4*  --   --   --   --   --     GFR: Estimated Creatinine Clearance: 46.7 mL/min (A) (by C-G formula based on SCr of 1.47 mg/dL (H)).  Liver Function Tests: Recent Labs  Lab 07/10/21 2000 07/11/21 0400  AST 12* 19  ALT 9 11  ALKPHOS 45 56  BILITOT 0.7 0.9  PROT 4.8* 6.7  ALBUMIN 1.9* 2.6*    CBG: Recent Labs  Lab 07/15/21 0425 07/15/21 0735 07/15/21 0756 07/15/21 0833 07/15/21 1147  GLUCAP 128* 67* 63* 187* 180*     Recent Results (from the past 240 hour(s))  Resp Panel by RT-PCR (Flu A&B, Covid) Nasopharyngeal Swab     Status: None   Collection Time: 07/10/21  3:22 PM   Specimen: Nasopharyngeal Swab; Nasopharyngeal(NP) swabs in vial transport medium  Result Value Ref Range Status   SARS Coronavirus 2 by RT PCR NEGATIVE NEGATIVE Final    Comment: (NOTE) SARS-CoV-2 target nucleic acids are NOT DETECTED.  The SARS-CoV-2 RNA is generally detectable in upper respiratory specimens during the acute phase of infection. The  lowest concentration of SARS-CoV-2 viral copies this assay can detect is 138 copies/mL. A negative result does not preclude SARS-Cov-2 infection and should not be used as the sole basis for treatment or other patient management decisions. A negative result may occur with  improper specimen collection/handling, submission of specimen other than nasopharyngeal swab, presence of viral mutation(s) within the areas targeted by this assay, and inadequate number of viral copies(<138 copies/mL). A negative result must be combined with clinical observations, patient history, and epidemiological information. The expected result is Negative.  Fact Sheet for Patients:  EntrepreneurPulse.com.au  Fact Sheet for Healthcare Providers:  IncredibleEmployment.be  This test is no t yet approved or cleared by the [place] and  has been authorized for  detection and/or diagnosis of SARS-CoV-2 by FDA under an Emergency Use Authorization (EUA). This EUA will remain  in effect (meaning this test can be used) for the duration of the COVID-19 declaration under Section 564(b)(1) of the Act, 21 U.S.C.section 360bbb-3(b)(1), unless the authorization is terminated  or revoked sooner.       Influenza A by PCR NEGATIVE NEGATIVE Final   Influenza B by PCR NEGATIVE NEGATIVE Final    Comment: (NOTE) The Xpert Xpress SARS-CoV-2/FLU/RSV plus assay is intended as an aid in the diagnosis of influenza from Nasopharyngeal swab specimens and should not be used as a sole basis for treatment. Nasal washings and aspirates are unacceptable for Xpert Xpress SARS-CoV-2/FLU/RSV testing.  Fact Sheet for Patients: EntrepreneurPulse.com.au  Fact Sheet for Healthcare Providers: IncredibleEmployment.be  This test is not yet approved or cleared by the [place] FDA and has been authorized for detection and/or diagnosis of SARS-CoV-2 by FDA under  an Emergency Use Authorization (EUA). This EUA will remain in effect (meaning this test can be used) for the duration of the COVID-19 declaration under Section 564(b)(1) of the Act, 21 U.S.C. section 360bbb-3(b)(1), unless the authorization is terminated or revoked.  Performed at [facility], [place] [address], [place], [place] [address]          Radiology Studies: MR BRAIN WO CONTRAST  Result Date: 07/13/2021 CLINICAL DATA:  Mental status change of unknown cause. Frequent falling. EXAM: MRI HEAD WITHOUT CONTRAST TECHNIQUE: Multiplanar, multiecho pulse sequences of the brain and surrounding structures were obtained without intravenous contrast. COMPARISON:  Head CT 07/10/2021 FINDINGS: Brain: The study suffers from considerable motion degradation. Diffusion imaging is of satisfactory quality and does not show any acute or subacute infarction. There is generalized brain atrophy. 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 No sign of large vessel infarction. No mass lesion, hemorrhage, hydrocephalus or extra-axial collection. Vascular: Major vessels at the base of the brain show flow. Skull and upper cervical spine: Negative Sinuses/Orbits: No sinus disease identified. Orbits appear negative as seen. Other: None IMPRESSION: Motion degraded abbreviated examination. Diffusion imaging is of satisfactory quality and does not show any acute or subacute infarction or other cause of restricted diffusion. Elsewhere, there is atrophy in there are old ischemic changes in the thalami, basal ganglia and hemispheric white matter. Electronically Signed   By: Nelson Chimes M.D.   On: 07/13/2021 17:50        Scheduled Meds:  acetaZOLAMIDE ER  500 mg Oral BID   albuterol  10 mg Nebulization Once   aspirin EC  81 mg Oral Daily   atorvastatin  80 mg Oral Daily   diltiazem  240 mg Oral Daily   enoxaparin (LOVENOX) injection  55 mg Subcutaneous Q24H   insulin  aspart  0-15 Units Subcutaneous TID WC   insulin aspart  3 Units Subcutaneous TID WC   insulin glargine-yfgn  35 Units Subcutaneous BID   isosorbide mononitrate  120 mg Oral Daily   metoprolol  200 mg Oral Daily   pantoprazole  40 mg Oral Daily   potassium chloride  40 mEq Oral Once   Continuous Infusions:   LOS: 4 days      Hosie Poisson, MD [facility]   To contact the attending provider between 7A-7P or the covering provider during after hours 7P-7A, please log into the web site [email or web address] and access using universal  password for that web site. If you do not have the  password, please call the hospital operator.  07/15/2021, 12:38 PM

## 2021-07-16 ENCOUNTER — Ambulatory Visit: Payer: Self-pay

## 2021-07-16 LAB — CBC
HCT: 39.7 % (ref 36.0–46.0)
Hemoglobin: 13.6 g/dL (ref 12.0–15.0)
MCH: 31 pg (ref 26.0–34.0)
MCHC: 34.3 g/dL (ref 30.0–36.0)
MCV: 90.4 fL (ref 80.0–100.0)
Platelets: 244 10*3/uL (ref 150–400)
RBC: 4.39 MIL/uL (ref 3.87–5.11)
RDW: 13 % (ref 11.5–15.5)
WBC: 10.1 10*3/uL (ref 4.0–10.5)
nRBC: 0 % (ref 0.0–0.2)

## 2021-07-16 LAB — BASIC METABOLIC PANEL
Anion gap: 11 (ref 5–15)
BUN: 21 mg/dL (ref 8–23)
CO2: 19 mmol/L — ABNORMAL LOW (ref 22–32)
Calcium: 8.6 mg/dL — ABNORMAL LOW (ref 8.9–10.3)
Chloride: 104 mmol/L (ref 98–111)
Creatinine, Ser: 1.67 mg/dL — ABNORMAL HIGH (ref 0.44–1.00)
GFR, Estimated: 34 mL/min — ABNORMAL LOW (ref >60)
Glucose, Bld: 115 mg/dL — ABNORMAL HIGH (ref 70–99)
Potassium: 3 mmol/L — ABNORMAL LOW (ref 3.5–5.1)
Sodium: 134 mmol/L — ABNORMAL LOW (ref 135–145)

## 2021-07-16 LAB — CREATININE, SERUM
Creatinine, Ser: 1.69 mg/dL — ABNORMAL HIGH (ref 0.44–1.00)
GFR, Estimated: 33 mL/min — ABNORMAL LOW (ref >60)

## 2021-07-16 LAB — GLUCOSE, CAPILLARY
Glucose-Capillary: 102 mg/dL — ABNORMAL HIGH (ref 70–99)
Glucose-Capillary: 118 mg/dL — ABNORMAL HIGH (ref 70–99)
Glucose-Capillary: 136 mg/dL — ABNORMAL HIGH (ref 70–99)
Glucose-Capillary: 143 mg/dL — ABNORMAL HIGH (ref 70–99)
Glucose-Capillary: 175 mg/dL — ABNORMAL HIGH (ref 70–99)
Glucose-Capillary: 54 mg/dL — ABNORMAL LOW (ref 70–99)
Glucose-Capillary: 85 mg/dL (ref 70–99)
Glucose-Capillary: 94 mg/dL (ref 70–99)

## 2021-07-16 LAB — SARS CORONAVIRUS 2 (TAT 6-24 HRS): SARS Coronavirus 2: NEGATIVE

## 2021-07-16 MED ORDER — INSULIN GLARGINE-YFGN 100 UNIT/ML ~~LOC~~ SOLN: 20.0000 [IU] | Freq: Every day | SUBCUTANEOUS | 11 refills | Status: DC

## 2021-07-16 MED ORDER — INSULIN ASPART 100 UNIT/ML IJ SOLN
0.0000 [IU] | Freq: Three times a day (TID) | INTRAMUSCULAR | Status: DC
  Administered 2021-07-16: 2 [IU] via SUBCUTANEOUS
  Administered 2021-07-17: 1 [IU] via SUBCUTANEOUS
  Administered 2021-07-17: 2 [IU] via SUBCUTANEOUS

## 2021-07-16 MED ORDER — INSULIN GLARGINE-YFGN 100 UNIT/ML ~~LOC~~ SOLN
20.0000 [IU] | Freq: Every day | SUBCUTANEOUS | Status: DC
  Filled 2021-07-16: qty 0.2

## 2021-07-16 MED ORDER — INSULIN GLARGINE-YFGN 100 UNIT/ML ~~LOC~~ SOLN: 30.0000 [IU] | Freq: Every day | SUBCUTANEOUS | Status: DC

## 2021-07-16 MED ORDER — POTASSIUM CHLORIDE CRYS ER 20 MEQ PO TBCR
40.0000 meq | EXTENDED_RELEASE_TABLET | Freq: Two times a day (BID) | ORAL | Status: AC
  Administered 2021-07-16 (×2): 40 meq via ORAL
  Filled 2021-07-16 (×2): qty 2

## 2021-07-16 MED ORDER — INSULIN ASPART 100 UNIT/ML IJ SOLN: INTRAMUSCULAR | 11 refills | Status: DC

## 2021-07-16 MED ORDER — INSULIN GLARGINE-YFGN 100 UNIT/ML ~~LOC~~ SOLN: 25.0000 [IU] | Freq: Every day | SUBCUTANEOUS | 11 refills | Status: DC

## 2021-07-16 MED ORDER — INSULIN GLARGINE-YFGN 100 UNIT/ML ~~LOC~~ SOLN
25.0000 [IU] | Freq: Every day | SUBCUTANEOUS | Status: DC
  Administered 2021-07-17: 25 [IU] via SUBCUTANEOUS
  Filled 2021-07-16: qty 0.25

## 2021-07-16 NOTE — Discharge Summary (Addendum)
Physician Discharge Summary  [name] KCM:[id number] DOB: [birth year] DOA: 07/10/2021  PCP: Ladell Pier, MD  Admit date: 07/10/2021 Discharge date: 07/17/2021  Admitted From: Home Disposition:  SNF   Recommendations for Outpatient Follow-up:  Follow up with PCP in 1-2 weeks Please obtain BMP/CBC in one week Please follow up thyroid panel in 4 weeks.    Discharge Condition: stable CODE STATUS:Full code. Diet recommendation: Heart Healthy / Carb Modified   Brief/Interim Summary: [name] is a 65 y.o. female with a history of morbid obesity, T2DM, CAD, HFpEF, HTN, HLD, CKD IIIb who was brought to the ED 12/9 by EMS from home due to recurrently slipping out of her wheelchair found to be severely hyperglycemic and progressively confused/somnolent. Hyperglycemia, hyperkalemia were noted for which IV fluids and IV insulin were started. Metabolic parameters have improved and patient's level of alertness has improved as well. SNF rehabilitation is recommended and is being pursued.  Discharge Diagnoses:  Principal Problem:   Hyperosmolar hyperglycemic state (HHS) ([place]) Active Problems:   Prolonged Q-T interval on ECG   Hypokalemia   High anion gap metabolic acidosis   Dehydration   Pseudohyponatremia   Acute kidney injury superimposed on CKD (HCC)   Hypoalbuminemia due to protein-calorie malnutrition (HCC)   Low TSH level   Noncompliance with medication regimen   Altered mental status   Acute metabolic encephalopathy  HHS and mild DKA and type 2 diabetes mellitus with diabetic neuropathy, retinopathy and nephropathy Secondary to noncompliance to medications. Last A1c is 12.2 Patient slightly hypoglycemic this morning, decrease the dose of Semglee to 20  units at night and 25 units during the day.    continue with SSI.    Chronic diastolic heart failure Patient appears to be euvolemic Continue with home dose of acetazolamide, metoprolol, Cardizem and Imdur.          History of coronary artery disease Continue with aspirin, beta-blocker, statin and Imdur.  Patient currently denies any chest pain.      Mild AKI:     Iron deficiency anemia secondary to chronic blood loss due to uterine bleeding       Generalized weakness and recurrent falls Therapy evaluations recommending SNF Currently waiting for SNF bed.       Acute metabolic encephalopathy Probably secondary to HHS Further imaging is negative.       Urinary tract infection Unfortunately cultures were not done on admission Completed  the course of rocephin.      Suppressed TSH with elevated free T4 Recommend checking thyroid panel in about 4 weeks and if the levels are still abnormal start the patient on antithyroid regimen.       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       Morbid obesity/Body mass index is 46.06 kg/m. Increased mortality and morbidity risk   Moderate protein calorie malnutrition:  - on supplements.      OSA:  Not on CPAP.        Hypokalemia: replaced  Discharge Instructions  Discharge Instructions     Diet - low sodium heart healthy   Complete by: As directed    Discharge instructions   Complete by: As directed    Please follow up with PCP IN ONE WEEK      Allergies as of 07/16/2021       Reactions   Ativan [lorazepam] Anaphylaxis   Orange Fruit [citrus] Anaphylaxis, Other (See Comments)   Pt stated throat swelling,  itching        Medication List     STOP taking these medications    B-12 1000 MCG Tabs   furosemide 40 MG tablet Commonly known as: LASIX   HumaLOG Mix 75/25 (75-25) 100 UNIT/ML Susp injection Generic drug: insulin lispro protamine-lispro   latanoprost 0.005 % ophthalmic solution Commonly known as: XALATAN   losartan 100 MG tablet Commonly known as: COZAAR   megestrol 40 MG tablet Commonly known as: MEGACE   pantoprazole 40 MG tablet Commonly  known as: PROTONIX   potassium chloride SA 20 MEQ tablet Commonly known as: KLOR-CON M       TAKE these medications    Accu-Chek Softclix Lancets lancets Use as instructed for 3 times daily blood glucose monitoring   acetaminophen 500 MG tablet Commonly known as: TYLENOL Take 500 mg by mouth every 6 (six) hours as needed for moderate pain or headache.   acetaZOLAMIDE ER 500 MG capsule Commonly known as: DIAMOX Take 1 capsule (500 mg total) by mouth 2 (two) times daily.   aspirin 81 MG EC tablet Take 1 tablet (81 mg total) by mouth daily.   atorvastatin 80 MG tablet Commonly known as: LIPITOR TAKE 1 TABLET BY MOUTH ONCE DAILY IN THE EVENING AT 6  PM What changed:  how much to take how to take this when to take this additional instructions   bismuth subsalicylate 655 VZ/48OL suspension Commonly known as: PEPTO BISMOL Take 30 mLs by mouth every 6 (six) hours as needed for indigestion.   Blood Pressure Monitor/Arm Devi 1 each by Does not apply route daily. ICD 10, I10 and I50.32   brimonidine-timolol 0.2-0.5 % ophthalmic solution Commonly known as: COMBIGAN Place 1 drop into both eyes 2 (two) times daily.   diltiazem 240 MG 24 hr capsule Commonly known as: CARDIZEM CD Take 1 capsule (240 mg total) by mouth daily.   glucose blood test strip Use TID before meals / Dx E11.9   glucose monitoring kit monitoring kit 1 each by Does not apply route 4 (four) times daily - after meals and at bedtime.   Accu-Chek Aviva Plus w/Device Kit Used as directed   insulin aspart 100 UNIT/ML injection Commonly known as: novoLOG CBG 70 - 120: 0 units  CBG 121 - 150: 1 unit  CBG 151 - 200: 2 units  CBG 201 - 250: 3 units  CBG 251 - 300: 5 units  CBG 301 - 350: 7 units  CBG 351 - 400: 9 units   insulin glargine-yfgn 100 UNIT/ML injection Commonly known as: SEMGLEE Inject 0.2 mLs (20 Units total) into the skin at bedtime. Start taking on: July 17, 2021   insulin  glargine-yfgn 100 UNIT/ML injection Commonly known as: SEMGLEE Inject 0.25 mLs (25 Units total) into the skin daily. Start taking on: July 17, 2021   isosorbide mononitrate 60 MG 24 hr tablet Commonly known as: IMDUR Take 2 tablets (120 mg total) by mouth daily.   metoprolol 200 MG 24 hr tablet Commonly known as: TOPROL-XL Take 1 tablet (200 mg total) by mouth daily. .   Muscle Rub 10-15 % Crea Apply 1 application topically daily as needed (knee pain).   nitroGLYCERIN 0.4 MG SL tablet Commonly known as: NITROSTAT DISSOLVE ONE TABLET UNDER THE TONGUE EVERY 5 MINUTES AS NEEDED FOR CHEST PAIN.  DO NOT EXCEED A TOTAL OF 3 DOSES IN 15 MINUTES What changed:  how much to take when to take this reasons to take this additional instructions  ReliOn Insulin Syringe 31G X 15/64" 1 ML Misc Generic drug: Insulin Syringe-Needle U-100 USE AS DIRECTED TWICE DAILY FOR INSULIN   Safety Insulin Syringes 30G X 1/2" 1 ML Misc Generic drug: Insulin Syringe-Needle U-100 Use as directed with insulin        Allergies  Allergen Reactions   Ativan [Lorazepam] Anaphylaxis   Orange Fruit [Citrus] Anaphylaxis and Other (See Comments)    Pt stated throat swelling, itching    Consultations: none   Procedures/Studies: CT Head Wo Contrast  Result Date: 07/10/2021 CLINICAL DATA:  Altered mental status, delirium EXAM: CT HEAD WITHOUT CONTRAST TECHNIQUE: Contiguous axial images were obtained from the base of the skull through the vertex without intravenous contrast. COMPARISON:  12/07/2020 FINDINGS: Brain: No evidence of acute infarction, hemorrhage, cerebral edema, mass, mass effect, or midline shift. No hydrocephalus or extra-axial fluid collection. Mild generalized atrophy. Periventricular white matter changes, likely the sequela of chronic small vessel ischemic disease. Vascular: No hyperdense vessel. Skull: Normal. Negative for fracture or focal lesion. Sinuses/Orbits: No acute finding. Other:  The mastoid air cells are well aerated. IMPRESSION: IMPRESSION No acute intracranial process. No etiology is seen for the patient's altered mental status. Electronically Signed   By: Merilyn Baba M.D.   On: 07/10/2021 18:59   MR BRAIN WO CONTRAST  Result Date: 07/13/2021 CLINICAL DATA:  Mental status change of unknown cause. Frequent falling. EXAM: MRI HEAD WITHOUT CONTRAST TECHNIQUE: Multiplanar, multiecho pulse sequences of the brain and surrounding structures were obtained without intravenous contrast. COMPARISON:  Head CT 07/10/2021 FINDINGS: Brain: The study suffers from considerable motion degradation. Diffusion imaging is of satisfactory quality and does not show any acute or subacute infarction. There is generalized brain atrophy. 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 No sign of large vessel infarction. No mass lesion, hemorrhage, hydrocephalus or extra-axial collection. Vascular: Major vessels at the base of the brain show flow. Skull and upper cervical spine: Negative Sinuses/Orbits: No sinus disease identified. Orbits appear negative as seen. Other: None IMPRESSION: Motion degraded abbreviated examination. Diffusion imaging is of satisfactory quality and does not show any acute or subacute infarction or other cause of restricted diffusion. Elsewhere, there is atrophy in there are old ischemic changes in the thalami, basal ganglia and hemispheric white matter. Electronically Signed   By: Nelson Chimes M.D.   On: 07/13/2021 17:50   DG Chest Portable 1 View  Result Date: 07/10/2021 CLINICAL DATA:  Altered mental status. EXAM: PORTABLE CHEST 1 VIEW COMPARISON:  Chest x-ray 04/12/2021. FINDINGS: Cardiac silhouette is enlarged, unchanged. Aorta is tortuous. There is no focal lung infiltrate, pleural effusion or pneumothorax. No acute fractures are seen. IMPRESSION: 1. No acute cardiopulmonary process. 2. Stable cardiomegaly. Electronically Signed   By: Ronney Asters M.D.   On: 07/10/2021 17:09     Subjective:  Pleasantly confused.  Discharge Exam: Vitals:   07/16/21 0358 07/16/21 0814  BP: (!) 112/46   Pulse: (!) 56 (!) 59  Resp: 18 18  Temp: 97.8 F (36.6 C) 97.8 F (36.6 C)  SpO2: 99%    Vitals:   07/15/21 2020 07/15/21 2301 07/16/21 0358 07/16/21 0814  BP: 137/75 (!) 110/49 (!) 112/46   Pulse: 61 61 (!) 56 (!) 59  Resp: _0 Temp: 98.5 F (36.9 C) 98.2 F (36.8 C) 97.8 F (36.6 C) 97.8 F (36.6 C)  TempSrc: Oral Oral Axillary Oral  SpO2: 99% 99% 99%   Weight:  Height:        General: Pt is alert, awake, not in acute distress Cardiovascular: RRR, S1/S2 +, no rubs, no gallops Respiratory: CTA bilaterally, no wheezing, no rhonchi Abdominal: Soft, NT, ND, bowel sounds + Extremities: no edema, no cyanosis    The results of significant diagnostics from this hospitalization (including imaging, microbiology, ancillary and laboratory) are listed below for reference.     Microbiology: Recent Results (from the past 240 hour(s))  Resp Panel by RT-PCR (Flu A&B, Covid) Nasopharyngeal Swab     Status: None   Collection Time: 07/10/21  3:22 PM   Specimen: Nasopharyngeal Swab; Nasopharyngeal(NP) swabs in vial transport medium  Result Value Ref Range Status   SARS Coronavirus 2 by RT PCR NEGATIVE NEGATIVE Final    Comment: (NOTE) SARS-CoV-2 target nucleic acids are NOT DETECTED.  The SARS-CoV-2 RNA is generally detectable in upper respiratory specimens during the acute phase of infection. The lowest concentration of SARS-CoV-2 viral copies this assay can detect is 138 copies/mL. A negative result does not preclude SARS-Cov-2 infection and should not be used as the sole basis for treatment or other patient management decisions. A negative result may occur with  improper specimen collection/handling, submission of specimen other than nasopharyngeal swab, presence of viral mutation(s) within the areas targeted by  this assay, and inadequate number of viral copies(<138 copies/mL). A negative result must be combined with clinical observations, patient history, and epidemiological information. The expected result is Negative.  Fact Sheet for Patients:  EntrepreneurPulse.com.au  Fact Sheet for Healthcare Providers:  IncredibleEmployment.be  This test is no t yet approved or cleared by the [place] FDA and  has been authorized for detection and/or diagnosis of SARS-CoV-2 by FDA under an Emergency Use Authorization (EUA). This EUA will remain  in effect (meaning this test can be used) for the duration of the COVID-19 declaration under Section 564(b)(1) of the Act, 21 U.S.C.section 360bbb-3(b)(1), unless the authorization is terminated  or revoked sooner.       Influenza A by PCR NEGATIVE NEGATIVE Final   Influenza B by PCR NEGATIVE NEGATIVE Final    Comment: (NOTE) The Xpert Xpress SARS-CoV-2/FLU/RSV plus assay is intended as an aid in the diagnosis of influenza from Nasopharyngeal swab specimens and should not be used as a sole basis for treatment. Nasal washings and aspirates are unacceptable for Xpert Xpress SARS-CoV-2/FLU/RSV testing.  Fact Sheet for Patients: EntrepreneurPulse.com.au  Fact Sheet for Healthcare Providers: IncredibleEmployment.be  This test is not yet approved or cleared by the [place] FDA and has been authorized for detection and/or diagnosis of SARS-CoV-2 by FDA under an Emergency Use Authorization (EUA). This EUA will remain in effect (meaning this test can be used) for the duration of the COVID-19 declaration under Section 564(b)(1) of the Act, 21 U.S.C. section 360bbb-3(b)(1), unless the authorization is terminated or revoked.  Performed at [facility], [place] [address] [place], [place] [address]   SARS CORONAVIRUS 2 (TAT 6-24 HRS) Nasopharyngeal Nasopharyngeal Swab      Status: None   Collection Time: 07/15/21  4:37 PM   Specimen: Nasopharyngeal Swab  Result Value Ref Range Status   SARS Coronavirus 2 NEGATIVE NEGATIVE Final    Comment: (NOTE) SARS-CoV-2 target nucleic acids are NOT DETECTED.  The SARS-CoV-2 RNA is generally detectable in upper and lower respiratory specimens during the acute phase of infection. Negative results do not preclude SARS-CoV-2 infection, do not rule out co-infections with other pathogens, and should not be used as  the sole basis for treatment or other patient management decisions. Negative results must be combined with clinical observations, patient history, and epidemiological information. The expected result is Negative.  Fact Sheet for Patients: SugarRoll.be  Fact Sheet for Healthcare Providers: https://www.woods-mathews.com/  This test is not yet approved or cleared by the [place] FDA and  has been authorized for detection and/or diagnosis of SARS-CoV-2 by FDA under an Emergency Use Authorization (EUA). This EUA will remain  in effect (meaning this test can be used) for the duration of the COVID-19 declaration under Se ction 564(b)(1) of the Act, 21 U.S.C. section 360bbb-3(b)(1), unless the authorization is terminated or revoked sooner.  Performed at [facility], [place] [address], [place], [place] [address]      Labs: BNP (last 3 results) Recent Labs    04/12/21 1853 07/10/21 1740  BNP 63.1 77.8   Basic Metabolic Panel: Recent Labs  Lab 07/11/21 0400 07/11/21 0942 07/12/21 0851 07/13/21 0241 07/14/21 0208 07/15/21 0203 07/16/21 0631  NA 135 135 134* 136 132* 130*  --   K 3.0* 3.0* 2.9* 3.4* 4.2 3.4*  --   CL 101 102 101 104 102 105  --   CO2 _0 16* 15*  --   GLUCOSE 202* 167* 236* 128* 114* 83  --   BUN _1 --   CREATININE 1.46* 1.28* 1.11* 1.15* 1.12* 1.47* 1.69*  CALCIUM 9.1 9.1 8.6* 9.0 8.7* 8.5*  --   MG 2.1   --  1.7  --   --   --   --   PHOS 2.4*  --   --   --   --   --   --    Liver Function Tests: Recent Labs  Lab 07/10/21 2000 07/11/21 0400  AST 12* 19  ALT 9 11  ALKPHOS 45 56  BILITOT 0.7 0.9  PROT 4.8* 6.7  ALBUMIN 1.9* 2.6*   No results for input(s): LIPASE, AMYLASE in the last 168 hours. Recent Labs  Lab 07/10/21 1740  AMMONIA 32   CBC: Recent Labs  Lab 07/10/21 1740 07/10/21 1837 07/11/21 0400 07/16/21 0631  WBC 10.6*  --  11.5* 10.1  NEUTROABS 8.3*  --   --   --   HGB 14.4 13.9   14.3 14.3 13.6  HCT 43.9 41.0   42.0 42.8 39.7  MCV 94.0  --  90.7 90.4  PLT 237  --  229 244   Cardiac Enzymes: Recent Labs  Lab 07/10/21 1923  CKTOTAL 72   BNP: Invalid input(s): POCBNP CBG: Recent Labs  Lab 07/15/21 2019 07/16/21 0004 07/16/21 0357 07/16/21 0435 07/16/21 0810  GLUCAP 143* 102* 54* 136* 94   D-Dimer No results for input(s): DDIMER in the last 72 hours. Hgb A1c No results for input(s): HGBA1C in the last 72 hours. Lipid Profile No results for input(s): CHOL, HDL, LDLCALC, TRIG, CHOLHDL, LDLDIRECT in the last 72 hours. Thyroid function studies No results for input(s): TSH, T4TOTAL, T3FREE, THYROIDAB in the last 72 hours.  Invalid input(s): FREET3 Anemia work up Recent Labs    07/14/21 0208  VITAMINB12 485   Urinalysis    Component Value Date/Time   COLORURINE YELLOW 07/10/2021 2014   APPEARANCEUR HAZY (A) 07/10/2021 2014   LABSPEC 1.022 07/10/2021 2014   PHURINE 5.0 07/10/2021 2014   GLUCOSEU >=500 (A) 07/10/2021 2014   HGBUR LARGE (A) 07/10/2021 2014   HGBUR large 04/03/2010 0931  [place] NEGATIVE 07/10/2021 2014   KETONESUR 5 (A) 07/10/2021 2014   PROTEINUR 30 (A) 07/10/2021 2014   UROBILINOGEN 0.2 03/22/2013 1159   NITRITE NEGATIVE 07/10/2021 2014   LEUKOCYTESUR MODERATE (A) 07/10/2021 2014   Sepsis Labs Invalid input(s): PROCALCITONIN,  WBC,  LACTICIDVEN Microbiology Recent Results (from the past 240 hour(s))  Resp Panel  by RT-PCR (Flu A&B, Covid) Nasopharyngeal Swab     Status: None   Collection Time: 07/10/21  3:22 PM   Specimen: Nasopharyngeal Swab; Nasopharyngeal(NP) swabs in vial transport medium  Result Value Ref Range Status   SARS Coronavirus 2 by RT PCR NEGATIVE NEGATIVE Final    Comment: (NOTE) SARS-CoV-2 target nucleic acids are NOT DETECTED.  The SARS-CoV-2 RNA is generally detectable in upper respiratory specimens during the acute phase of infection. The lowest concentration of SARS-CoV-2 viral copies this assay can detect is 138 copies/mL. A negative result does not preclude SARS-Cov-2 infection and should not be used as the sole basis for treatment or other patient management decisions. A negative result may occur with  improper specimen collection/handling, submission of specimen other than nasopharyngeal swab, presence of viral mutation(s) within the areas targeted by this assay, and inadequate number of viral copies(<138 copies/mL). A negative result must be combined with clinical observations, patient history, and epidemiological information. The expected result is Negative.  Fact Sheet for Patients:  EntrepreneurPulse.com.au  Fact Sheet for Healthcare Providers:  IncredibleEmployment.be  This test is no t yet approved or cleared by the [place] FDA and  has been authorized for detection and/or diagnosis of SARS-CoV-2 by FDA under an Emergency Use Authorization (EUA). This EUA will remain  in effect (meaning this test can be used) for the duration of the COVID-19 declaration under Section 564(b)(1) of the Act, 21 U.S.C.section 360bbb-3(b)(1), unless the authorization is terminated  or revoked sooner.       Influenza A by PCR NEGATIVE NEGATIVE Final   Influenza B by PCR NEGATIVE NEGATIVE Final    Comment: (NOTE) The Xpert Xpress SARS-CoV-2/FLU/RSV plus assay is intended as an aid in the diagnosis of influenza from Nasopharyngeal swab  specimens and should not be used as a sole basis for treatment. Nasal washings and aspirates are unacceptable for Xpert Xpress SARS-CoV-2/FLU/RSV testing.  Fact Sheet for Patients: EntrepreneurPulse.com.au  Fact Sheet for Healthcare Providers: IncredibleEmployment.be  This test is not yet approved or cleared by the [place] FDA and has been authorized for detection and/or diagnosis of SARS-CoV-2 by FDA under an Emergency Use Authorization (EUA). This EUA will remain in effect (meaning this test can be used) for the duration of the COVID-19 declaration under Section 564(b)(1) of the Act, 21 U.S.C. section 360bbb-3(b)(1), unless the authorization is terminated or revoked.  Performed at [facility], [place] [address] [place], [place] [address]   SARS CORONAVIRUS 2 (TAT 6-24 HRS) Nasopharyngeal Nasopharyngeal Swab     Status: None   Collection Time: 07/15/21  4:37 PM   Specimen: Nasopharyngeal Swab  Result Value Ref Range Status   SARS Coronavirus 2 NEGATIVE NEGATIVE Final    Comment: (NOTE) SARS-CoV-2 target nucleic acids are NOT DETECTED.  The SARS-CoV-2 RNA is generally detectable in upper and lower respiratory specimens during the acute phase of infection. Negative results do not preclude SARS-CoV-2 infection, do not rule out co-infections with other pathogens, and should not be used as the sole basis for treatment or other patient management decisions. Negative results must be combined with clinical observations, patient history, and epidemiological  information. The expected result is Negative.  Fact Sheet for Patients: SugarRoll.be  Fact Sheet for Healthcare Providers: https://www.woods-mathews.com/  This test is not yet approved or cleared by the [place] FDA and  has been authorized for detection and/or diagnosis of SARS-CoV-2 by FDA under an Emergency Use Authorization (EUA).  This EUA will remain  in effect (meaning this test can be used) for the duration of the COVID-19 declaration under Se ction 564(b)(1) of the Act, 21 U.S.C. section 360bbb-3(b)(1), unless the authorization is terminated or revoked sooner.  Performed at [facility], [place] [address], [place], [place] [address]      Time coordinating discharge: 43 MINUTES  SIGNED:   Hosie Poisson, MD  [facility] 07/16/2021, 11:15 AM

## 2021-07-16 NOTE — Progress Notes (Signed)
Inpatient Diabetes Program Recommendations  AACE/ADA: New Consensus Statement on Inpatient Glycemic Control (2015)  Target Ranges:  Prepandial:   less than 140 mg/dL      Peak postprandial:   less than 180 mg/dL (1-2 hours)      Critically ill patients:  140 - 180 mg/dL   Lab Results  Component Value Date   GLUCAP 94 07/16/2021   HGBA1C 12.2 (H) 07/11/2021    Review of Glycemic Control  Latest Reference Range & Units 07/16/21 00:04 07/16/21 03:57 07/16/21 04:35 07/16/21 08:10  Glucose-Capillary 70 - 99 mg/dL 102 (H) 54 (L) 136 (H) 94   Diabetes history: DM2 Outpatient Diabetes medications: Humalog 75/25 54 units BID Current orders for Inpatient glycemic control: Semglee 30 units BID, Novolog 0-15 units TID with meals + 3 units TID   Inpatient Diabetes Program Recommendations:   Creatinine 1.69 this AM   Consider further decreasing Semglee to 25 units BID. May also need to reduce correction to Novolog 0-9 units TID.   Thanks, Bronson Curb, MSN, RNC-OB Diabetes Coordinator [phone] (8a-5p)

## 2021-07-16 NOTE — Care Management Important Message (Signed)
Important Message  Patient Details  Name: [name] MRN: [id number] Date of Birth: 03-Aug-1955   Medicare Important Message Given:  Yes     [name] 07/16/2021, 9:30 AM

## 2021-07-17 DIAGNOSIS — E1142 Type 2 diabetes mellitus with diabetic polyneuropathy: Secondary | ICD-10-CM | POA: Diagnosis not present

## 2021-07-17 DIAGNOSIS — R410 Disorientation, unspecified: Secondary | ICD-10-CM | POA: Diagnosis not present

## 2021-07-17 DIAGNOSIS — R41841 Cognitive communication deficit: Secondary | ICD-10-CM | POA: Diagnosis not present

## 2021-07-17 DIAGNOSIS — E1165 Type 2 diabetes mellitus with hyperglycemia: Secondary | ICD-10-CM | POA: Diagnosis not present

## 2021-07-17 DIAGNOSIS — M6281 Muscle weakness (generalized): Secondary | ICD-10-CM | POA: Diagnosis not present

## 2021-07-17 DIAGNOSIS — R1312 Dysphagia, oropharyngeal phase: Secondary | ICD-10-CM | POA: Diagnosis not present

## 2021-07-17 DIAGNOSIS — R739 Hyperglycemia, unspecified: Secondary | ICD-10-CM | POA: Diagnosis not present

## 2021-07-17 DIAGNOSIS — N179 Acute kidney failure, unspecified: Secondary | ICD-10-CM | POA: Diagnosis not present

## 2021-07-17 DIAGNOSIS — E1143 Type 2 diabetes mellitus with diabetic autonomic (poly)neuropathy: Secondary | ICD-10-CM | POA: Diagnosis not present

## 2021-07-17 DIAGNOSIS — G9341 Metabolic encephalopathy: Secondary | ICD-10-CM | POA: Diagnosis not present

## 2021-07-17 DIAGNOSIS — R5383 Other fatigue: Secondary | ICD-10-CM | POA: Diagnosis not present

## 2021-07-17 DIAGNOSIS — R5381 Other malaise: Secondary | ICD-10-CM | POA: Diagnosis not present

## 2021-07-17 DIAGNOSIS — Z9114 Patient's other noncompliance with medication regimen: Secondary | ICD-10-CM | POA: Diagnosis not present

## 2021-07-17 DIAGNOSIS — E785 Hyperlipidemia, unspecified: Secondary | ICD-10-CM | POA: Diagnosis not present

## 2021-07-17 DIAGNOSIS — E1140 Type 2 diabetes mellitus with diabetic neuropathy, unspecified: Secondary | ICD-10-CM | POA: Diagnosis not present

## 2021-07-17 DIAGNOSIS — E113593 Type 2 diabetes mellitus with proliferative diabetic retinopathy without macular edema, bilateral: Secondary | ICD-10-CM | POA: Diagnosis not present

## 2021-07-17 DIAGNOSIS — N189 Chronic kidney disease, unspecified: Secondary | ICD-10-CM | POA: Diagnosis not present

## 2021-07-17 DIAGNOSIS — E782 Mixed hyperlipidemia: Secondary | ICD-10-CM | POA: Diagnosis not present

## 2021-07-17 DIAGNOSIS — Z7401 Bed confinement status: Secondary | ICD-10-CM | POA: Diagnosis not present

## 2021-07-17 DIAGNOSIS — E1159 Type 2 diabetes mellitus with other circulatory complications: Secondary | ICD-10-CM | POA: Diagnosis not present

## 2021-07-17 DIAGNOSIS — E1100 Type 2 diabetes mellitus with hyperosmolarity without nonketotic hyperglycemic-hyperosmolar coma (NKHHC): Secondary | ICD-10-CM | POA: Diagnosis not present

## 2021-07-17 DIAGNOSIS — E440 Moderate protein-calorie malnutrition: Secondary | ICD-10-CM | POA: Diagnosis not present

## 2021-07-17 DIAGNOSIS — Z743 Need for continuous supervision: Secondary | ICD-10-CM | POA: Diagnosis not present

## 2021-07-17 DIAGNOSIS — R404 Transient alteration of awareness: Secondary | ICD-10-CM | POA: Diagnosis not present

## 2021-07-17 LAB — GLUCOSE, CAPILLARY
Glucose-Capillary: 100 mg/dL — ABNORMAL HIGH (ref 70–99)
Glucose-Capillary: 90 mg/dL (ref 70–99)
Glucose-Capillary: 145 mg/dL — ABNORMAL HIGH (ref 70–99)
Glucose-Capillary: 183 mg/dL — ABNORMAL HIGH (ref 70–99)

## 2021-07-17 LAB — BASIC METABOLIC PANEL
Anion gap: 7 (ref 5–15)
BUN: 23 mg/dL (ref 8–23)
CO2: 19 mmol/L — ABNORMAL LOW (ref 22–32)
Calcium: 8.3 mg/dL — ABNORMAL LOW (ref 8.9–10.3)
Chloride: 107 mmol/L (ref 98–111)
Creatinine, Ser: 1.55 mg/dL — ABNORMAL HIGH (ref 0.44–1.00)
GFR, Estimated: 37 mL/min — ABNORMAL LOW (ref >60)
Glucose, Bld: 115 mg/dL — ABNORMAL HIGH (ref 70–99)
Potassium: 3.4 mmol/L — ABNORMAL LOW (ref 3.5–5.1)
Sodium: 133 mmol/L — ABNORMAL LOW (ref 135–145)

## 2021-07-17 MED ORDER — POTASSIUM CHLORIDE CRYS ER 20 MEQ PO TBCR
40.0000 meq | EXTENDED_RELEASE_TABLET | Freq: Once | ORAL | Status: AC
  Administered 2021-07-17: 40 meq via ORAL
  Filled 2021-07-17: qty 2

## 2021-07-17 NOTE — Discharge Summary (Signed)
Physician Discharge Summary  [name] EXH:[id number] DOB: 1956/06/14 DOA: 07/10/2021  PCP: Ladell Pier, MD  Admit date: 07/10/2021 Discharge date: 07/17/2021  Admitted From: Home Disposition:  SNF   Recommendations for Outpatient Follow-up:  Follow up with PCP in 1-2 weeks Please obtain BMP/CBC in one week Please follow up thyroid panel in 4 weeks.    Discharge Condition: stable CODE STATUS:Full code. Diet recommendation: Heart Healthy / Carb Modified   Brief/Interim Summary: [name] is a 65 y.o. female with a history of morbid obesity, T2DM, CAD, HFpEF, HTN, HLD, CKD IIIb who was brought to the ED 12/9 by EMS from home due to recurrently slipping out of her wheelchair found to be severely hyperglycemic and progressively confused/somnolent. Hyperglycemia, hyperkalemia were noted for which IV fluids and IV insulin were started. Metabolic parameters have improved and patient's level of alertness has improved as well. SNF rehabilitation is recommended and is being pursued.  Discharge Diagnoses:  Principal Problem:   Hyperosmolar hyperglycemic state (HHS) ([facility]) Active Problems:   Prolonged Q-T interval on ECG   Hypokalemia   High anion gap metabolic acidosis   Dehydration   Pseudohyponatremia   Acute kidney injury superimposed on CKD (HCC)   Hypoalbuminemia due to protein-calorie malnutrition (HCC)   Low TSH level   Noncompliance with medication regimen   Altered mental status   Acute metabolic encephalopathy  HHS and mild DKA and type 2 diabetes mellitus with diabetic neuropathy, retinopathy and nephropathy Secondary to noncompliance to medications. Last A1c is 12.2 Patient slightly hypoglycemic this morning, decrease the dose of Semglee to 20  units at night and 25 units during the day.    continue with SSI.  CBG (last 3)  Recent Labs    07/16/21 2327 07/17/21 0416 07/17/21 0813  GLUCAP 118* 100* 90      Chronic diastolic heart failure Patient  appears to be euvolemic Continue with home dose of acetazolamide, metoprolol, Cardizem and Imdur.         History of coronary artery disease Continue with aspirin, beta-blocker, statin and Imdur.  Patient currently denies any chest pain.      Mild AKI:     Iron deficiency anemia secondary to chronic blood loss due to uterine bleeding       Generalized weakness and recurrent falls Therapy evaluations recommending SNF Currently waiting for SNF bed.       Acute metabolic encephalopathy Probably secondary to HHS Further imaging is negative.       Urinary tract infection Unfortunately cultures were not done on admission Completed  the course of rocephin.      Suppressed TSH with elevated free T4 Recommend checking thyroid panel in about 4 weeks and if the levels are still abnormal start the patient on antithyroid regimen.       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       Morbid obesity/Body mass index is 46.06 kg/m. Increased mortality and morbidity risk   Moderate protein calorie malnutrition:  - on supplements.      OSA:  Not on CPAP.        Hypokalemia: replaced  Discharge Instructions  Discharge Instructions     Diet - low sodium heart healthy   Complete by: As directed    Discharge instructions   Complete by: As directed    Please follow up with PCP IN ONE WEEK      Allergies as of 07/17/2021  Reactions   Ativan [lorazepam] Anaphylaxis   Orange Fruit [citrus] Anaphylaxis, Other (See Comments)   Pt stated throat swelling, itching        Medication List     STOP taking these medications    B-12 1000 MCG Tabs   furosemide 40 MG tablet Commonly known as: LASIX   HumaLOG Mix 75/25 (75-25) 100 UNIT/ML Susp injection Generic drug: insulin lispro protamine-lispro   latanoprost 0.005 % ophthalmic solution Commonly known as: XALATAN   losartan 100 MG tablet Commonly known  as: COZAAR   megestrol 40 MG tablet Commonly known as: MEGACE   pantoprazole 40 MG tablet Commonly known as: PROTONIX   potassium chloride SA 20 MEQ tablet Commonly known as: KLOR-CON M       TAKE these medications    Accu-Chek Softclix Lancets lancets Use as instructed for 3 times daily blood glucose monitoring   acetaminophen 500 MG tablet Commonly known as: TYLENOL Take 500 mg by mouth every 6 (six) hours as needed for moderate pain or headache.   acetaZOLAMIDE ER 500 MG capsule Commonly known as: DIAMOX Take 1 capsule (500 mg total) by mouth 2 (two) times daily.   aspirin 81 MG EC tablet Take 1 tablet (81 mg total) by mouth daily.   atorvastatin 80 MG tablet Commonly known as: LIPITOR TAKE 1 TABLET BY MOUTH ONCE DAILY IN THE EVENING AT 6  PM What changed:  how much to take how to take this when to take this additional instructions   bismuth subsalicylate 315 VV/61YW suspension Commonly known as: PEPTO BISMOL Take 30 mLs by mouth every 6 (six) hours as needed for indigestion.   Blood Pressure Monitor/Arm Devi 1 each by Does not apply route daily. ICD 10, I10 and I50.32   brimonidine-timolol 0.2-0.5 % ophthalmic solution Commonly known as: COMBIGAN Place 1 drop into both eyes 2 (two) times daily.   diltiazem 240 MG 24 hr capsule Commonly known as: CARDIZEM CD Take 1 capsule (240 mg total) by mouth daily.   glucose blood test strip Use TID before meals / Dx E11.9   glucose monitoring kit monitoring kit 1 each by Does not apply route 4 (four) times daily - after meals and at bedtime.   Accu-Chek Aviva Plus w/Device Kit Used as directed   insulin aspart 100 UNIT/ML injection Commonly known as: novoLOG CBG 70 - 120: 0 units  CBG 121 - 150: 1 unit  CBG 151 - 200: 2 units  CBG 201 - 250: 3 units  CBG 251 - 300: 5 units  CBG 301 - 350: 7 units  CBG 351 - 400: 9 units   insulin glargine-yfgn 100 UNIT/ML injection Commonly known as: SEMGLEE Inject  0.2 mLs (20 Units total) into the skin at bedtime.   insulin glargine-yfgn 100 UNIT/ML injection Commonly known as: SEMGLEE Inject 0.25 mLs (25 Units total) into the skin daily.   isosorbide mononitrate 60 MG 24 hr tablet Commonly known as: IMDUR Take 2 tablets (120 mg total) by mouth daily.   metoprolol 200 MG 24 hr tablet Commonly known as: TOPROL-XL Take 1 tablet (200 mg total) by mouth daily. .   Muscle Rub 10-15 % Crea Apply 1 application topically daily as needed (knee pain).   nitroGLYCERIN 0.4 MG SL tablet Commonly known as: NITROSTAT DISSOLVE ONE TABLET UNDER THE TONGUE EVERY 5 MINUTES AS NEEDED FOR CHEST PAIN.  DO NOT EXCEED A TOTAL OF 3 DOSES IN 15 MINUTES What changed:  how much to take when  to take this reasons to take this additional instructions   ReliOn Insulin Syringe 31G X 15/64" 1 ML Misc Generic drug: Insulin Syringe-Needle U-100 USE AS DIRECTED TWICE DAILY FOR INSULIN   Safety Insulin Syringes 30G X 1/2" 1 ML Misc Generic drug: Insulin Syringe-Needle U-100 Use as directed with insulin        Allergies  Allergen Reactions   Ativan [Lorazepam] Anaphylaxis   Orange Fruit [Citrus] Anaphylaxis and Other (See Comments)    Pt stated throat swelling, itching    Consultations: none   Procedures/Studies: CT Head Wo Contrast  Result Date: 07/10/2021 CLINICAL DATA:  Altered mental status, delirium EXAM: CT HEAD WITHOUT CONTRAST TECHNIQUE: Contiguous axial images were obtained from the base of the skull through the vertex without intravenous contrast. COMPARISON:  12/07/2020 FINDINGS: Brain: No evidence of acute infarction, hemorrhage, cerebral edema, mass, mass effect, or midline shift. No hydrocephalus or extra-axial fluid collection. Mild generalized atrophy. Periventricular white matter changes, likely the sequela of chronic small vessel ischemic disease. Vascular: No hyperdense vessel. Skull: Normal. Negative for fracture or focal lesion.  Sinuses/Orbits: No acute finding. Other: The mastoid air cells are well aerated. IMPRESSION: IMPRESSION No acute intracranial process. No etiology is seen for the patient's altered mental status. Electronically Signed   By: Merilyn Baba M.D.   On: 07/10/2021 18:59   MR BRAIN WO CONTRAST  Result Date: 07/13/2021 CLINICAL DATA:  Mental status change of unknown cause. Frequent falling. EXAM: MRI HEAD WITHOUT CONTRAST TECHNIQUE: Multiplanar, multiecho pulse sequences of the brain and surrounding structures were obtained without intravenous contrast. COMPARISON:  Head CT 07/10/2021 FINDINGS: Brain: The study suffers from considerable motion degradation. Diffusion imaging is of satisfactory quality and does not show any acute or subacute infarction. There is generalized brain atrophy. 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 No sign of large vessel infarction. No mass lesion, hemorrhage, hydrocephalus or extra-axial collection. Vascular: Major vessels at the base of the brain show flow. Skull and upper cervical spine: Negative Sinuses/Orbits: No sinus disease identified. Orbits appear negative as seen. Other: None IMPRESSION: Motion degraded abbreviated examination. Diffusion imaging is of satisfactory quality and does not show any acute or subacute infarction or other cause of restricted diffusion. Elsewhere, there is atrophy in there are old ischemic changes in the thalami, basal ganglia and hemispheric white matter. Electronically Signed   By: Nelson Chimes M.D.   On: 07/13/2021 17:50   DG Chest Portable 1 View  Result Date: 07/10/2021 CLINICAL DATA:  Altered mental status. EXAM: PORTABLE CHEST 1 VIEW COMPARISON:  Chest x-ray 04/12/2021. FINDINGS: Cardiac silhouette is enlarged, unchanged. Aorta is tortuous. There is no focal lung infiltrate, pleural effusion or pneumothorax. No acute fractures are seen. IMPRESSION: 1. No acute cardiopulmonary process. 2. Stable  cardiomegaly. Electronically Signed   By: Ronney Asters M.D.   On: 07/10/2021 17:09     Subjective:  Pleasantly confused.  Discharge Exam: Vitals:   07/17/21 0400 07/17/21 0811  BP: (!) 101/47 131/61  Pulse: 62 60  Resp: 14 12  Temp: 98.5 F (36.9 C) 98.3 F (36.8 C)  SpO2: 97% 99%   Vitals:   07/16/21 2005 07/16/21 2326 07/17/21 0400 07/17/21 0811  BP: 140/71 (!) 116/54 (!) 101/47 131/61  Pulse: 62 60 62 60  Resp: _0 Temp: 98.8 F (37.1 C) 98.4 F (36.9 C) 98.5 F (36.9 C) 98.3 F (36.8 C)  TempSrc: Oral Oral Oral Oral  SpO2: 99%  97% 97% 99%  Weight:      Height:        General: Pt is alert, awake, not in acute distress Cardiovascular: RRR, S1/S2 +, no rubs, no gallops Respiratory: CTA bilaterally, no wheezing, no rhonchi Abdominal: Soft, NT, ND, bowel sounds + Extremities: no edema, no cyanosis    The results of significant diagnostics from this hospitalization (including imaging, microbiology, ancillary and laboratory) are listed below for reference.     Microbiology: Recent Results (from the past 240 hour(s))  Resp Panel by RT-PCR (Flu A&B, Covid) Nasopharyngeal Swab     Status: None   Collection Time: 07/10/21  3:22 PM   Specimen: Nasopharyngeal Swab; Nasopharyngeal(NP) swabs in vial transport medium  Result Value Ref Range Status   SARS Coronavirus 2 by RT PCR NEGATIVE NEGATIVE Final    Comment: (NOTE) SARS-CoV-2 target nucleic acids are NOT DETECTED.  The SARS-CoV-2 RNA is generally detectable in upper respiratory specimens during the acute phase of infection. The lowest concentration of SARS-CoV-2 viral copies this assay can detect is 138 copies/mL. A negative result does not preclude SARS-Cov-2 infection and should not be used as the sole basis for treatment or other patient management decisions. A negative result may occur with  improper specimen collection/handling, submission of specimen other than nasopharyngeal swab, presence of  viral mutation(s) within the areas targeted by this assay, and inadequate number of viral copies(<138 copies/mL). A negative result must be combined with clinical observations, patient history, and epidemiological information. The expected result is Negative.  Fact Sheet for Patients:  EntrepreneurPulse.com.au  Fact Sheet for Healthcare Providers:  IncredibleEmployment.be  This test is no t yet approved or cleared by the [place] FDA and  has been authorized for detection and/or diagnosis of SARS-CoV-2 by FDA under an Emergency Use Authorization (EUA). This EUA will remain  in effect (meaning this test can be used) for the duration of the COVID-19 declaration under Section 564(b)(1) of the Act, 21 U.S.C.section 360bbb-3(b)(1), unless the authorization is terminated  or revoked sooner.       Influenza A by PCR NEGATIVE NEGATIVE Final   Influenza B by PCR NEGATIVE NEGATIVE Final    Comment: (NOTE) The Xpert Xpress SARS-CoV-2/FLU/RSV plus assay is intended as an aid in the diagnosis of influenza from Nasopharyngeal swab specimens and should not be used as a sole basis for treatment. Nasal washings and aspirates are unacceptable for Xpert Xpress SARS-CoV-2/FLU/RSV testing.  Fact Sheet for Patients: EntrepreneurPulse.com.au  Fact Sheet for Healthcare Providers: IncredibleEmployment.be  This test is not yet approved or cleared by the [place] FDA and has been authorized for detection and/or diagnosis of SARS-CoV-2 by FDA under an Emergency Use Authorization (EUA). This EUA will remain in effect (meaning this test can be used) for the duration of the COVID-19 declaration under Section 564(b)(1) of the Act, 21 U.S.C. section 360bbb-3(b)(1), unless the authorization is terminated or revoked.  Performed at [facility], [place] [address], [place], [place] [address]   SARS CORONAVIRUS 2 (TAT 6-24  HRS) Nasopharyngeal Nasopharyngeal Swab     Status: None   Collection Time: 07/15/21  4:37 PM   Specimen: Nasopharyngeal Swab  Result Value Ref Range Status   SARS Coronavirus 2 NEGATIVE NEGATIVE Final    Comment: (NOTE) SARS-CoV-2 target nucleic acids are NOT DETECTED.  The SARS-CoV-2 RNA is generally detectable in upper and lower respiratory specimens during the acute phase of infection. Negative results do not preclude SARS-CoV-2 infection, do not rule out  co-infections with other pathogens, and should not be used as the sole basis for treatment or other patient management decisions. Negative results must be combined with clinical observations, patient history, and epidemiological information. The expected result is Negative.  Fact Sheet for Patients: SugarRoll.be  Fact Sheet for Healthcare Providers: https://www.woods-mathews.com/  This test is not yet approved or cleared by the [place] FDA and  has been authorized for detection and/or diagnosis of SARS-CoV-2 by FDA under an Emergency Use Authorization (EUA). This EUA will remain  in effect (meaning this test can be used) for the duration of the COVID-19 declaration under Se ction 564(b)(1) of the Act, 21 U.S.C. section 360bbb-3(b)(1), unless the authorization is terminated or revoked sooner.  Performed at [facility], [place] [address] [place], [place] [address]      Labs: BNP (last 3 results) Recent Labs    04/12/21 1853 07/10/21 1740  BNP 63.1 24.8    Basic Metabolic Panel: Recent Labs  Lab 07/11/21 0400 07/11/21 0942 07/12/21 0851 07/13/21 0241 07/14/21 0208 07/15/21 0203 07/16/21 0631 07/17/21 0230  NA 135   < > 134* 136 132* 130* 134* 133*  K 3.0*   < > 2.9* 3.4* 4.2 3.4* 3.0* 3.4*  CL 101   < > 101 104 102 105 104 107  CO2 24   < > 23 23 16* 15* 19* 19*  GLUCOSE 202*   < > 236* 128* 114* 83 115* 115*  BUN 21   < > _0 CREATININE  1.46*   < > 1.11* 1.15* 1.12* 1.47* 1.67*   1.69* 1.55*  CALCIUM 9.1   < > 8.6* 9.0 8.7* 8.5* 8.6* 8.3*  MG 2.1  --  1.7  --   --   --   --   --   PHOS 2.4*  --   --   --   --   --   --   --    < > = values in this interval not displayed.    Liver Function Tests: Recent Labs  Lab 07/10/21 2000 07/11/21 0400  AST 12* 19  ALT 9 11  ALKPHOS 45 56  BILITOT 0.7 0.9  PROT 4.8* 6.7  ALBUMIN 1.9* 2.6*    No results for input(s): LIPASE, AMYLASE in the last 168 hours. Recent Labs  Lab 07/10/21 1740  AMMONIA 32    CBC: Recent Labs  Lab 07/10/21 1740 07/10/21 1837 07/11/21 0400 07/16/21 0631  WBC 10.6*  --  11.5* 10.1  NEUTROABS 8.3*  --   --   --   HGB 14.4 13.9   14.3 14.3 13.6  HCT 43.9 41.0   42.0 42.8 39.7  MCV 94.0  --  90.7 90.4  PLT 237  --  229 244    Cardiac Enzymes: Recent Labs  Lab 07/10/21 1923  CKTOTAL 72    BNP: Invalid input(s): POCBNP CBG: Recent Labs  Lab 07/16/21 1626 07/16/21 2007 07/16/21 2327 07/17/21 0416 07/17/21 0813  GLUCAP 175* 143* 118* 100* 90    D-Dimer No results for input(s): DDIMER in the last 72 hours. Hgb A1c No results for input(s): HGBA1C in the last 72 hours. Lipid Profile No results for input(s): CHOL, HDL, LDLCALC, TRIG, CHOLHDL, LDLDIRECT in the last 72 hours. Thyroid function studies No results for input(s): TSH, T4TOTAL, T3FREE, THYROIDAB in the last 72 hours.  Invalid input(s): FREET3 Anemia work up No results for input(s): VITAMINB12, FOLATE, FERRITIN, TIBC, IRON,  RETICCTPCT in the last 72 hours.  Urinalysis    Component Value Date/Time   COLORURINE YELLOW 07/10/2021 2014   APPEARANCEUR HAZY (A) 07/10/2021 2014   LABSPEC 1.022 07/10/2021 2014   PHURINE 5.0 07/10/2021 2014   GLUCOSEU >=500 (A) 07/10/2021 2014   HGBUR LARGE (A) 07/10/2021 2014   HGBUR large 04/03/2010 [place] NEGATIVE 07/10/2021 2014   KETONESUR 5 (A) 07/10/2021 2014   PROTEINUR 30 (A) 07/10/2021 2014   UROBILINOGEN 0.2  03/22/2013 1159   NITRITE NEGATIVE 07/10/2021 2014   LEUKOCYTESUR MODERATE (A) 07/10/2021 2014   Sepsis Labs Invalid input(s): PROCALCITONIN,  WBC,  LACTICIDVEN Microbiology Recent Results (from the past 240 hour(s))  Resp Panel by RT-PCR (Flu A&B, Covid) Nasopharyngeal Swab     Status: None   Collection Time: 07/10/21  3:22 PM   Specimen: Nasopharyngeal Swab; Nasopharyngeal(NP) swabs in vial transport medium  Result Value Ref Range Status   SARS Coronavirus 2 by RT PCR NEGATIVE NEGATIVE Final    Comment: (NOTE) SARS-CoV-2 target nucleic acids are NOT DETECTED.  The SARS-CoV-2 RNA is generally detectable in upper respiratory specimens during the acute phase of infection. The lowest concentration of SARS-CoV-2 viral copies this assay can detect is 138 copies/mL. A negative result does not preclude SARS-Cov-2 infection and should not be used as the sole basis for treatment or other patient management decisions. A negative result may occur with  improper specimen collection/handling, submission of specimen other than nasopharyngeal swab, presence of viral mutation(s) within the areas targeted by this assay, and inadequate number of viral copies(<138 copies/mL). A negative result must be combined with clinical observations, patient history, and epidemiological information. The expected result is Negative.  Fact Sheet for Patients:  EntrepreneurPulse.com.au  Fact Sheet for Healthcare Providers:  IncredibleEmployment.be  This test is no t yet approved or cleared by the [place] FDA and  has been authorized for detection and/or diagnosis of SARS-CoV-2 by FDA under an Emergency Use Authorization (EUA). This EUA will remain  in effect (meaning this test can be used) for the duration of the COVID-19 declaration under Section 564(b)(1) of the Act, 21 U.S.C.section 360bbb-3(b)(1), unless the authorization is terminated  or revoked sooner.        Influenza A by PCR NEGATIVE NEGATIVE Final   Influenza B by PCR NEGATIVE NEGATIVE Final    Comment: (NOTE) The Xpert Xpress SARS-CoV-2/FLU/RSV plus assay is intended as an aid in the diagnosis of influenza from Nasopharyngeal swab specimens and should not be used as a sole basis for treatment. Nasal washings and aspirates are unacceptable for Xpert Xpress SARS-CoV-2/FLU/RSV testing.  Fact Sheet for Patients: EntrepreneurPulse.com.au  Fact Sheet for Healthcare Providers: IncredibleEmployment.be  This test is not yet approved or cleared by the [place] FDA and has been authorized for detection and/or diagnosis of SARS-CoV-2 by FDA under an Emergency Use Authorization (EUA). This EUA will remain in effect (meaning this test can be used) for the duration of the COVID-19 declaration under Section 564(b)(1) of the Act, 21 U.S.C. section 360bbb-3(b)(1), unless the authorization is terminated or revoked.  Performed at [facility], [place] [address] [place], [place] [address]   SARS CORONAVIRUS 2 (TAT 6-24 HRS) Nasopharyngeal Nasopharyngeal Swab     Status: None   Collection Time: 07/15/21  4:37 PM   Specimen: Nasopharyngeal Swab  Result Value Ref Range Status   SARS Coronavirus 2 NEGATIVE NEGATIVE Final    Comment: (NOTE) SARS-CoV-2 target nucleic acids are NOT DETECTED.  The SARS-CoV-2 RNA is generally detectable in upper and lower respiratory specimens during the acute phase of infection. Negative results do not preclude SARS-CoV-2 infection, do not rule out co-infections with other pathogens, and should not be used as the sole basis for treatment or other patient management decisions. Negative results must be combined with clinical observations, patient history, and epidemiological information. The expected result is Negative.  Fact Sheet for Patients: SugarRoll.be  Fact Sheet for Healthcare  Providers: https://www.woods-mathews.com/  This test is not yet approved or cleared by the [place] FDA and  has been authorized for detection and/or diagnosis of SARS-CoV-2 by FDA under an Emergency Use Authorization (EUA). This EUA will remain  in effect (meaning this test can be used) for the duration of the COVID-19 declaration under Se ction 564(b)(1) of the Act, 21 U.S.C. section 360bbb-3(b)(1), unless the authorization is terminated or revoked sooner.  Performed at [facility], [place] [address], [place], [place] [address]      Time coordinating discharge: 49 MINUTES  SIGNED:   Hosie Poisson, MD  [facility] 07/17/2021, 10:55 AM

## 2021-07-17 NOTE — Progress Notes (Signed)
Report has been called to [facility] D/C paperwork printed, No IV to remove. TOC meds at bedside.   Chrisandra Carota, RN 07/17/2021 12:32 PM

## 2021-07-17 NOTE — TOC Transition Note (Signed)
Transition of Care [facility]) - CM/SW Discharge Note   Patient Details  Name: [name] MRN: [id number] Date of Birth: [birth year]  Transition of Care [facility]) CM/SW Contact:  Vinie Sill, LCSW Phone Number: 07/17/2021, 12:14 PM   Clinical Narrative:     Patient will Discharge to: [place]  Discharge Date: 07/17/2021 Family Notified: left voice message w/patient's sister Mardene Celeste  Transport By: Corey Harold  Per MD patient is ready for discharge. RN, patient, and facility notified of discharge. Discharge Summary sent to facility. RN given number for [phone], Room 112. Ambulance transport requested for patient.   Clinical Social Worker signing off.  Thurmond Butts, MSW, LCSW Clinical Social Worker    Final next level of care: Skilled Nursing Facility Barriers to Discharge: Barriers Resolved   Patient Goals and CMS Choice   CMS Medicare.gov Compare Post Acute Care list provided to:: Patient Represenative (must comment) (provided website)    Discharge Placement              Patient chooses bed at: [facility] Patient to be transferred to facility by: [place] Name of family member notified: left voice message w/sister patricia Patient and family notified of of transfer: 07/17/21  Discharge Plan and Services                                     Social Determinants of Health (SDOH) Interventions     Readmission Risk Interventions No flowsheet data found.

## 2021-07-17 NOTE — Progress Notes (Signed)
Patient picked up by [facility] to be transported to [facility]. She is alert, breathing normally and in no acute distress. Personal belongings sent with patient. Fuller Canada, RN

## 2021-07-20 DIAGNOSIS — E1165 Type 2 diabetes mellitus with hyperglycemia: Secondary | ICD-10-CM | POA: Diagnosis not present

## 2021-07-20 DIAGNOSIS — M6281 Muscle weakness (generalized): Secondary | ICD-10-CM | POA: Diagnosis not present

## 2021-07-20 DIAGNOSIS — R5383 Other fatigue: Secondary | ICD-10-CM | POA: Diagnosis not present

## 2021-07-20 DIAGNOSIS — R5381 Other malaise: Secondary | ICD-10-CM | POA: Diagnosis not present

## 2021-07-21 DIAGNOSIS — E782 Mixed hyperlipidemia: Secondary | ICD-10-CM | POA: Diagnosis not present

## 2021-07-21 DIAGNOSIS — E1140 Type 2 diabetes mellitus with diabetic neuropathy, unspecified: Secondary | ICD-10-CM | POA: Diagnosis not present

## 2021-07-21 DIAGNOSIS — E1159 Type 2 diabetes mellitus with other circulatory complications: Secondary | ICD-10-CM | POA: Diagnosis not present

## 2021-07-23 DIAGNOSIS — Z9114 Patient's other noncompliance with medication regimen: Secondary | ICD-10-CM | POA: Diagnosis not present

## 2021-07-23 DIAGNOSIS — E1142 Type 2 diabetes mellitus with diabetic polyneuropathy: Secondary | ICD-10-CM | POA: Diagnosis not present

## 2021-07-23 DIAGNOSIS — E785 Hyperlipidemia, unspecified: Secondary | ICD-10-CM | POA: Diagnosis not present

## 2021-07-28 ENCOUNTER — Other Ambulatory Visit: Payer: Self-pay

## 2021-07-28 NOTE — Patient Outreach (Signed)
[facility]) Care Management  07/28/2021  ANISSA ABBS 03/22/1956 [id number]   Case Closure   Patient discharged from hospital to SNF.     Plan: RN CM will close case.  Enzo Montgomery, RN,BSN,CCM [place] Management Telephonic Care Management Coordinator Direct Phone: [phone] Toll Free: [phone] Fax: [phone]

## 2021-08-02 DIAGNOSIS — D500 Iron deficiency anemia secondary to blood loss (chronic): Secondary | ICD-10-CM | POA: Diagnosis not present

## 2021-08-02 DIAGNOSIS — M6281 Muscle weakness (generalized): Secondary | ICD-10-CM | POA: Diagnosis not present

## 2021-08-02 DIAGNOSIS — R627 Adult failure to thrive: Secondary | ICD-10-CM | POA: Diagnosis not present

## 2021-08-02 DIAGNOSIS — G9341 Metabolic encephalopathy: Secondary | ICD-10-CM | POA: Diagnosis not present

## 2021-08-02 DIAGNOSIS — E440 Moderate protein-calorie malnutrition: Secondary | ICD-10-CM | POA: Diagnosis not present

## 2021-08-02 DIAGNOSIS — Z1159 Encounter for screening for other viral diseases: Secondary | ICD-10-CM | POA: Diagnosis not present

## 2021-08-02 DIAGNOSIS — E1165 Type 2 diabetes mellitus with hyperglycemia: Secondary | ICD-10-CM | POA: Diagnosis not present

## 2021-08-02 DIAGNOSIS — I5032 Chronic diastolic (congestive) heart failure: Secondary | ICD-10-CM | POA: Diagnosis not present

## 2021-08-02 DIAGNOSIS — E1100 Type 2 diabetes mellitus with hyperosmolarity without nonketotic hyperglycemic-hyperosmolar coma (NKHHC): Secondary | ICD-10-CM | POA: Diagnosis not present

## 2021-08-02 DIAGNOSIS — K3184 Gastroparesis: Secondary | ICD-10-CM | POA: Diagnosis not present

## 2021-08-02 DIAGNOSIS — N1832 Chronic kidney disease, stage 3b: Secondary | ICD-10-CM | POA: Diagnosis not present

## 2021-08-02 DIAGNOSIS — R600 Localized edema: Secondary | ICD-10-CM | POA: Diagnosis not present

## 2021-08-02 DIAGNOSIS — G8929 Other chronic pain: Secondary | ICD-10-CM | POA: Diagnosis not present

## 2021-08-02 DIAGNOSIS — G5603 Carpal tunnel syndrome, bilateral upper limbs: Secondary | ICD-10-CM | POA: Diagnosis not present

## 2021-08-02 DIAGNOSIS — H548 Legal blindness, as defined in USA: Secondary | ICD-10-CM | POA: Diagnosis not present

## 2021-08-02 DIAGNOSIS — K219 Gastro-esophageal reflux disease without esophagitis: Secondary | ICD-10-CM | POA: Diagnosis not present

## 2021-08-02 DIAGNOSIS — E860 Dehydration: Secondary | ICD-10-CM | POA: Diagnosis not present

## 2021-08-02 DIAGNOSIS — H409 Unspecified glaucoma: Secondary | ICD-10-CM | POA: Diagnosis not present

## 2021-08-02 DIAGNOSIS — R1312 Dysphagia, oropharyngeal phase: Secondary | ICD-10-CM | POA: Diagnosis not present

## 2021-08-02 DIAGNOSIS — F32A Depression, unspecified: Secondary | ICD-10-CM | POA: Diagnosis not present

## 2021-08-02 DIAGNOSIS — E8729 Other acidosis: Secondary | ICD-10-CM | POA: Diagnosis not present

## 2021-08-02 DIAGNOSIS — R971 Elevated cancer antigen 125 [CA 125]: Secondary | ICD-10-CM | POA: Diagnosis not present

## 2021-08-02 DIAGNOSIS — G3184 Mild cognitive impairment, so stated: Secondary | ICD-10-CM | POA: Diagnosis not present

## 2021-08-02 DIAGNOSIS — E113593 Type 2 diabetes mellitus with proliferative diabetic retinopathy without macular edema, bilateral: Secondary | ICD-10-CM | POA: Diagnosis not present

## 2021-08-02 DIAGNOSIS — E1143 Type 2 diabetes mellitus with diabetic autonomic (poly)neuropathy: Secondary | ICD-10-CM | POA: Diagnosis not present

## 2021-08-02 DIAGNOSIS — M170 Bilateral primary osteoarthritis of knee: Secondary | ICD-10-CM | POA: Diagnosis not present

## 2021-08-05 DIAGNOSIS — M6281 Muscle weakness (generalized): Secondary | ICD-10-CM | POA: Diagnosis not present

## 2021-08-05 DIAGNOSIS — F32A Depression, unspecified: Secondary | ICD-10-CM | POA: Diagnosis not present

## 2021-08-05 DIAGNOSIS — E1165 Type 2 diabetes mellitus with hyperglycemia: Secondary | ICD-10-CM | POA: Diagnosis not present

## 2021-08-06 DIAGNOSIS — M6281 Muscle weakness (generalized): Secondary | ICD-10-CM | POA: Diagnosis not present

## 2021-08-06 DIAGNOSIS — F32A Depression, unspecified: Secondary | ICD-10-CM | POA: Diagnosis not present

## 2021-08-06 DIAGNOSIS — R627 Adult failure to thrive: Secondary | ICD-10-CM | POA: Diagnosis not present

## 2021-08-06 DIAGNOSIS — G3184 Mild cognitive impairment, so stated: Secondary | ICD-10-CM | POA: Diagnosis not present

## 2021-08-07 DIAGNOSIS — E113593 Type 2 diabetes mellitus with proliferative diabetic retinopathy without macular edema, bilateral: Secondary | ICD-10-CM | POA: Diagnosis not present

## 2021-08-07 DIAGNOSIS — F32A Depression, unspecified: Secondary | ICD-10-CM | POA: Diagnosis not present

## 2021-08-11 ENCOUNTER — Ambulatory Visit: Payer: Self-pay

## 2021-08-11 DIAGNOSIS — E1165 Type 2 diabetes mellitus with hyperglycemia: Secondary | ICD-10-CM | POA: Diagnosis not present

## 2021-08-11 DIAGNOSIS — E1122 Type 2 diabetes mellitus with diabetic chronic kidney disease: Secondary | ICD-10-CM | POA: Diagnosis not present

## 2021-08-11 DIAGNOSIS — I5032 Chronic diastolic (congestive) heart failure: Secondary | ICD-10-CM | POA: Diagnosis not present

## 2021-08-11 DIAGNOSIS — E1142 Type 2 diabetes mellitus with diabetic polyneuropathy: Secondary | ICD-10-CM | POA: Diagnosis not present

## 2021-08-11 DIAGNOSIS — I130 Hypertensive heart and chronic kidney disease with heart failure and stage 1 through stage 4 chronic kidney disease, or unspecified chronic kidney disease: Secondary | ICD-10-CM | POA: Diagnosis not present

## 2021-08-11 DIAGNOSIS — N1832 Chronic kidney disease, stage 3b: Secondary | ICD-10-CM | POA: Diagnosis not present

## 2021-08-11 NOTE — Telephone Encounter (Signed)
[facility] from [facility] called, left VM to call back and discuss pt's medications with a nurse.   Summary: advice - meds   [facility] home health aid was calling to follow up on the pts medications, caller needed some clarification regarding the pts insulin

## 2021-08-11 NOTE — Telephone Encounter (Signed)
3rd attempt, [facility] called, left VM to call office back. Will route to office per protocol.

## 2021-08-11 NOTE — Telephone Encounter (Signed)
2nd attempt. [facility] called, left VM to call back and speak with a nurse

## 2021-08-12 ENCOUNTER — Telehealth: Payer: Self-pay | Admitting: Internal Medicine

## 2021-08-12 DIAGNOSIS — N1832 Chronic kidney disease, stage 3b: Secondary | ICD-10-CM | POA: Diagnosis not present

## 2021-08-12 DIAGNOSIS — E1142 Type 2 diabetes mellitus with diabetic polyneuropathy: Secondary | ICD-10-CM | POA: Diagnosis not present

## 2021-08-12 DIAGNOSIS — E1165 Type 2 diabetes mellitus with hyperglycemia: Secondary | ICD-10-CM | POA: Diagnosis not present

## 2021-08-12 DIAGNOSIS — I130 Hypertensive heart and chronic kidney disease with heart failure and stage 1 through stage 4 chronic kidney disease, or unspecified chronic kidney disease: Secondary | ICD-10-CM | POA: Diagnosis not present

## 2021-08-12 DIAGNOSIS — E1122 Type 2 diabetes mellitus with diabetic chronic kidney disease: Secondary | ICD-10-CM | POA: Diagnosis not present

## 2021-08-12 DIAGNOSIS — I5032 Chronic diastolic (congestive) heart failure: Secondary | ICD-10-CM | POA: Diagnosis not present

## 2021-08-12 NOTE — Telephone Encounter (Signed)
Copied from [place] [id number]. Topic: Quick Communication - Home Health Verbal Orders >> Aug 12, 2021  1:12 PM Leward Quan A wrote: Caller/Agency: Meredith // [place] Number: [phone] ok to LM  Requesting OT/PT/Skilled Nursing/Social Work/Speech Therapy: OT  Frequency: 1w2, 2w1 and 1 w2

## 2021-08-14 ENCOUNTER — Telehealth: Payer: Self-pay | Admitting: Internal Medicine

## 2021-08-14 ENCOUNTER — Other Ambulatory Visit: Payer: Self-pay | Admitting: Pharmacist

## 2021-08-14 MED ORDER — FREESTYLE LIBRE 14 DAY READER DEVI: 3 refills | Status: AC

## 2021-08-14 MED ORDER — FREESTYLE LIBRE 14 DAY SENSOR MISC: 3 refills | Status: AC

## 2021-08-14 NOTE — Telephone Encounter (Signed)
Returned [place] call and provided verbal orders.

## 2021-08-14 NOTE — Telephone Encounter (Signed)
Returned call to [employer]. [facility] didn't answer lvm

## 2021-08-14 NOTE — Telephone Encounter (Signed)
Copied from [place] [id number]. Topic: Quick Communication - Home Health Verbal Orders >> Aug 13, 2021  3:07 PM Yvette Rack wrote: Ailene Ravel with [facility] called for update on verbal order request. Cb# [phone]

## 2021-08-14 NOTE — Telephone Encounter (Signed)
[name] returned call and requested a medication list be faxed to [facility] and for a freestyle libre meter be sent in for pt to her pharmacy. Made [name] aware that I was not in the office today and per [name] medication list can be faxed on Monday. Thanked her and made her aware that I will get faxed over on Monday and will get meter sent to pharmacy  [facility] could you please send in [name] to pt pharmacy

## 2021-08-17 ENCOUNTER — Other Ambulatory Visit: Payer: Self-pay

## 2021-08-17 ENCOUNTER — Emergency Department (HOSPITAL_COMMUNITY): Payer: Medicare Other

## 2021-08-17 ENCOUNTER — Inpatient Hospital Stay (HOSPITAL_COMMUNITY)
Admission: EM | Admit: 2021-08-17 | Discharge: 2021-08-21 | DRG: 178 | Disposition: A | Discharge status: Home Health | Payer: Medicare Other | Attending: Internal Medicine | Admitting: Internal Medicine

## 2021-08-17 DIAGNOSIS — Z823 Family history of stroke: Secondary | ICD-10-CM | POA: Diagnosis not present

## 2021-08-17 DIAGNOSIS — E876 Hypokalemia: Secondary | ICD-10-CM | POA: Diagnosis not present

## 2021-08-17 DIAGNOSIS — K219 Gastro-esophageal reflux disease without esophagitis: Secondary | ICD-10-CM | POA: Diagnosis present

## 2021-08-17 DIAGNOSIS — E861 Hypovolemia: Secondary | ICD-10-CM | POA: Diagnosis not present

## 2021-08-17 DIAGNOSIS — I471 Supraventricular tachycardia: Secondary | ICD-10-CM | POA: Diagnosis present

## 2021-08-17 DIAGNOSIS — Z28310 Unvaccinated for covid-19: Secondary | ICD-10-CM

## 2021-08-17 DIAGNOSIS — Z800 Family history of malignant neoplasm of digestive organs: Secondary | ICD-10-CM | POA: Diagnosis not present

## 2021-08-17 DIAGNOSIS — I2510 Atherosclerotic heart disease of native coronary artery without angina pectoris: Secondary | ICD-10-CM | POA: Diagnosis present

## 2021-08-17 DIAGNOSIS — Z7401 Bed confinement status: Secondary | ICD-10-CM | POA: Diagnosis not present

## 2021-08-17 DIAGNOSIS — Z6841 Body Mass Index (BMI) 40.0 and over, adult: Secondary | ICD-10-CM | POA: Diagnosis not present

## 2021-08-17 DIAGNOSIS — Z833 Family history of diabetes mellitus: Secondary | ICD-10-CM

## 2021-08-17 DIAGNOSIS — R531 Weakness: Secondary | ICD-10-CM | POA: Diagnosis not present

## 2021-08-17 DIAGNOSIS — A0839 Other viral enteritis: Secondary | ICD-10-CM | POA: Diagnosis not present

## 2021-08-17 DIAGNOSIS — Z803 Family history of malignant neoplasm of breast: Secondary | ICD-10-CM | POA: Diagnosis not present

## 2021-08-17 DIAGNOSIS — R269 Unspecified abnormalities of gait and mobility: Secondary | ICD-10-CM | POA: Diagnosis not present

## 2021-08-17 DIAGNOSIS — Z8249 Family history of ischemic heart disease and other diseases of the circulatory system: Secondary | ICD-10-CM

## 2021-08-17 DIAGNOSIS — N179 Acute kidney failure, unspecified: Secondary | ICD-10-CM | POA: Diagnosis not present

## 2021-08-17 DIAGNOSIS — E1122 Type 2 diabetes mellitus with diabetic chronic kidney disease: Secondary | ICD-10-CM | POA: Diagnosis present

## 2021-08-17 DIAGNOSIS — U071 COVID-19: Secondary | ICD-10-CM | POA: Diagnosis not present

## 2021-08-17 DIAGNOSIS — I248 Other forms of acute ischemic heart disease: Secondary | ICD-10-CM | POA: Diagnosis present

## 2021-08-17 DIAGNOSIS — E860 Dehydration: Secondary | ICD-10-CM | POA: Diagnosis present

## 2021-08-17 DIAGNOSIS — R778 Other specified abnormalities of plasma proteins: Secondary | ICD-10-CM

## 2021-08-17 DIAGNOSIS — R5381 Other malaise: Secondary | ICD-10-CM | POA: Diagnosis present

## 2021-08-17 DIAGNOSIS — I130 Hypertensive heart and chronic kidney disease with heart failure and stage 1 through stage 4 chronic kidney disease, or unspecified chronic kidney disease: Secondary | ICD-10-CM | POA: Diagnosis present

## 2021-08-17 DIAGNOSIS — Z794 Long term (current) use of insulin: Secondary | ICD-10-CM

## 2021-08-17 DIAGNOSIS — M170 Bilateral primary osteoarthritis of knee: Secondary | ICD-10-CM | POA: Diagnosis not present

## 2021-08-17 DIAGNOSIS — N1832 Chronic kidney disease, stage 3b: Secondary | ICD-10-CM | POA: Diagnosis not present

## 2021-08-17 DIAGNOSIS — R109 Unspecified abdominal pain: Secondary | ICD-10-CM | POA: Diagnosis not present

## 2021-08-17 DIAGNOSIS — E11319 Type 2 diabetes mellitus with unspecified diabetic retinopathy without macular edema: Secondary | ICD-10-CM | POA: Diagnosis present

## 2021-08-17 DIAGNOSIS — J80 Acute respiratory distress syndrome: Secondary | ICD-10-CM | POA: Diagnosis not present

## 2021-08-17 DIAGNOSIS — Z7982 Long term (current) use of aspirin: Secondary | ICD-10-CM

## 2021-08-17 DIAGNOSIS — E1140 Type 2 diabetes mellitus with diabetic neuropathy, unspecified: Secondary | ICD-10-CM | POA: Diagnosis present

## 2021-08-17 DIAGNOSIS — Z8371 Family history of colonic polyps: Secondary | ICD-10-CM

## 2021-08-17 DIAGNOSIS — N9489 Other specified conditions associated with female genital organs and menstrual cycle: Secondary | ICD-10-CM

## 2021-08-17 DIAGNOSIS — J9811 Atelectasis: Secondary | ICD-10-CM | POA: Diagnosis not present

## 2021-08-17 DIAGNOSIS — I5032 Chronic diastolic (congestive) heart failure: Secondary | ICD-10-CM | POA: Diagnosis present

## 2021-08-17 DIAGNOSIS — I517 Cardiomegaly: Secondary | ICD-10-CM | POA: Diagnosis not present

## 2021-08-17 DIAGNOSIS — Z79899 Other long term (current) drug therapy: Secondary | ICD-10-CM

## 2021-08-17 DIAGNOSIS — I252 Old myocardial infarction: Secondary | ICD-10-CM | POA: Diagnosis not present

## 2021-08-17 DIAGNOSIS — R079 Chest pain, unspecified: Secondary | ICD-10-CM | POA: Diagnosis not present

## 2021-08-17 DIAGNOSIS — H409 Unspecified glaucoma: Secondary | ICD-10-CM | POA: Diagnosis present

## 2021-08-17 DIAGNOSIS — I700 Atherosclerosis of aorta: Secondary | ICD-10-CM | POA: Diagnosis not present

## 2021-08-17 DIAGNOSIS — I214 Non-ST elevation (NSTEMI) myocardial infarction: Secondary | ICD-10-CM

## 2021-08-17 DIAGNOSIS — R4182 Altered mental status, unspecified: Secondary | ICD-10-CM

## 2021-08-17 DIAGNOSIS — R7989 Other specified abnormal findings of blood chemistry: Secondary | ICD-10-CM

## 2021-08-17 LAB — CBC WITH DIFFERENTIAL/PLATELET
Abs Immature Granulocytes: 0.04 10*3/uL (ref 0.00–0.07)
Basophils Absolute: 0 10*3/uL (ref 0.0–0.1)
Basophils Relative: 0 %
Eosinophils Absolute: 0.1 10*3/uL (ref 0.0–0.5)
Eosinophils Relative: 1 %
HCT: 44.2 % (ref 36.0–46.0)
Hemoglobin: 14.3 g/dL (ref 12.0–15.0)
Immature Granulocytes: 0 %
Lymphocytes Relative: 20 %
Lymphs Abs: 2.2 10*3/uL (ref 0.7–4.0)
MCH: 30.1 pg (ref 26.0–34.0)
MCHC: 32.4 g/dL (ref 30.0–36.0)
MCV: 93.1 fL (ref 80.0–100.0)
Monocytes Absolute: 0.9 10*3/uL (ref 0.1–1.0)
Monocytes Relative: 9 %
Neutro Abs: 7.7 10*3/uL (ref 1.7–7.7)
Neutrophils Relative %: 70 %
Platelets: 236 10*3/uL (ref 150–400)
RBC: 4.75 MIL/uL (ref 3.87–5.11)
RDW: 14.4 % (ref 11.5–15.5)
WBC: 10.9 10*3/uL — ABNORMAL HIGH (ref 4.0–10.5)
nRBC: 0 % (ref 0.0–0.2)

## 2021-08-17 LAB — TROPONIN I (HIGH SENSITIVITY)
Troponin I (High Sensitivity): 57 ng/L — ABNORMAL HIGH (ref <18)
Troponin I (High Sensitivity): 49 ng/L — ABNORMAL HIGH (ref <18)

## 2021-08-17 LAB — URINALYSIS, ROUTINE W REFLEX MICROSCOPIC
Bilirubin Urine: NEGATIVE
Glucose, UA: NEGATIVE mg/dL
Hgb urine dipstick: NEGATIVE
Ketones, ur: NEGATIVE mg/dL
Leukocytes,Ua: NEGATIVE
Nitrite: NEGATIVE
Protein, ur: NEGATIVE mg/dL
Specific Gravity, Urine: 1.025 (ref 1.005–1.030)
pH: 5.5 (ref 5.0–8.0)

## 2021-08-17 LAB — COMPREHENSIVE METABOLIC PANEL
ALT: 16 U/L (ref 0–44)
AST: 28 U/L (ref 15–41)
Albumin: 3 g/dL — ABNORMAL LOW (ref 3.5–5.0)
Alkaline Phosphatase: 67 U/L (ref 38–126)
Anion gap: 14 (ref 5–15)
BUN: 20 mg/dL (ref 8–23)
CO2: 23 mmol/L (ref 22–32)
Calcium: 9.3 mg/dL (ref 8.9–10.3)
Chloride: 100 mmol/L (ref 98–111)
Creatinine, Ser: 1.9 mg/dL — ABNORMAL HIGH (ref 0.44–1.00)
GFR, Estimated: 29 mL/min — ABNORMAL LOW (ref >60)
Glucose, Bld: 85 mg/dL (ref 70–99)
Potassium: 2.6 mmol/L — CL (ref 3.5–5.1)
Sodium: 137 mmol/L (ref 135–145)
Total Bilirubin: 0.8 mg/dL (ref 0.3–1.2)
Total Protein: 8 g/dL (ref 6.5–8.1)

## 2021-08-17 LAB — RESP PANEL BY RT-PCR (FLU A&B, COVID) ARPGX2
Influenza A by PCR: NEGATIVE
Influenza B by PCR: NEGATIVE
SARS Coronavirus 2 by RT PCR: POSITIVE — AB

## 2021-08-17 LAB — AMMONIA: Ammonia: 55 umol/L — ABNORMAL HIGH (ref 9–35)

## 2021-08-17 LAB — CBG MONITORING, ED
Glucose-Capillary: 90 mg/dL (ref 70–99)
Glucose-Capillary: 122 mg/dL — ABNORMAL HIGH (ref 70–99)

## 2021-08-17 LAB — MAGNESIUM: Magnesium: 2.2 mg/dL (ref 1.7–2.4)

## 2021-08-17 LAB — LACTIC ACID, PLASMA
Lactic Acid, Venous: 2.9 mmol/L (ref 0.5–1.9)
Lactic Acid, Venous: 1.8 mmol/L (ref 0.5–1.9)

## 2021-08-17 LAB — PHOSPHORUS: Phosphorus: 3.9 mg/dL (ref 2.5–4.6)

## 2021-08-17 LAB — FERRITIN: Ferritin: 211 ng/mL (ref 11–307)

## 2021-08-17 LAB — LIPASE, BLOOD: Lipase: 37 U/L (ref 11–51)

## 2021-08-17 LAB — D-DIMER, QUANTITATIVE: D-Dimer, Quant: 1.4 ug/mL-FEU — ABNORMAL HIGH (ref 0.00–0.50)

## 2021-08-17 LAB — LACTATE DEHYDROGENASE: LDH: 196 U/L — ABNORMAL HIGH (ref 98–192)

## 2021-08-17 LAB — FIBRINOGEN: Fibrinogen: 648 mg/dL — ABNORMAL HIGH (ref 210–475)

## 2021-08-17 MED ORDER — SODIUM CHLORIDE 0.9 % IV SOLN: INTRAVENOUS | Status: AC

## 2021-08-17 MED ORDER — DILTIAZEM HCL 60 MG PO TABS
60.0000 mg | ORAL_TABLET | Freq: Four times a day (QID) | ORAL | Status: DC
  Administered 2021-08-18 – 2021-08-21 (×13): 60 mg via ORAL
  Filled 2021-08-17 (×19): qty 1

## 2021-08-17 MED ORDER — TIMOLOL MALEATE 0.5 % OP SOLN
1.0000 [drp] | Freq: Two times a day (BID) | OPHTHALMIC | Status: DC
  Administered 2021-08-18 – 2021-08-21 (×7): 1 [drp] via OPHTHALMIC
  Filled 2021-08-17: qty 5

## 2021-08-17 MED ORDER — HEPARIN SODIUM (PORCINE) 5000 UNIT/ML IJ SOLN
5000.0000 [IU] | Freq: Two times a day (BID) | INTRAMUSCULAR | Status: DC
  Administered 2021-08-17 – 2021-08-21 (×8): 5000 [IU] via SUBCUTANEOUS
  Filled 2021-08-17 (×8): qty 1

## 2021-08-17 MED ORDER — ATORVASTATIN CALCIUM 80 MG PO TABS
80.0000 mg | ORAL_TABLET | Freq: Every day | ORAL | Status: DC
  Administered 2021-08-17 – 2021-08-21 (×5): 80 mg via ORAL
  Filled 2021-08-17: qty 1
  Filled 2021-08-17 (×2): qty 2
  Filled 2021-08-17 (×2): qty 1

## 2021-08-17 MED ORDER — POTASSIUM CHLORIDE 10 MEQ/100ML IV SOLN
10.0000 meq | INTRAVENOUS | Status: AC
  Administered 2021-08-17: 10 meq via INTRAVENOUS
  Filled 2021-08-17 (×2): qty 100

## 2021-08-17 MED ORDER — SODIUM CHLORIDE 0.9 % IV SOLN
200.0000 mg | Freq: Once | INTRAVENOUS | Status: AC
  Administered 2021-08-17: 200 mg via INTRAVENOUS
  Filled 2021-08-17: qty 40

## 2021-08-17 MED ORDER — BRIMONIDINE TARTRATE-TIMOLOL 0.2-0.5 % OP SOLN: 1.0000 [drp] | Freq: Two times a day (BID) | OPHTHALMIC | Status: DC

## 2021-08-17 MED ORDER — ASPIRIN EC 81 MG PO TBEC
81.0000 mg | DELAYED_RELEASE_TABLET | Freq: Every day | ORAL | Status: DC
  Administered 2021-08-17 – 2021-08-21 (×5): 81 mg via ORAL
  Filled 2021-08-17 (×5): qty 1

## 2021-08-17 MED ORDER — MUSCLE RUB 10-15 % EX CREA
TOPICAL_CREAM | CUTANEOUS | Status: DC | PRN
  Filled 2021-08-17: qty 85

## 2021-08-17 MED ORDER — SODIUM CHLORIDE 0.9 % IV BOLUS
500.0000 mL | Freq: Once | INTRAVENOUS | Status: AC
  Administered 2021-08-17: 500 mL via INTRAVENOUS

## 2021-08-17 MED ORDER — INSULIN ASPART 100 UNIT/ML IJ SOLN
0.0000 [IU] | Freq: Three times a day (TID) | INTRAMUSCULAR | Status: DC
  Administered 2021-08-18: 3 [IU] via SUBCUTANEOUS
  Administered 2021-08-18: 1 [IU] via SUBCUTANEOUS
  Administered 2021-08-18 – 2021-08-19 (×2): 3 [IU] via SUBCUTANEOUS
  Administered 2021-08-19: 2 [IU] via SUBCUTANEOUS
  Administered 2021-08-19 – 2021-08-20 (×2): 3 [IU] via SUBCUTANEOUS
  Administered 2021-08-20: 7 [IU] via SUBCUTANEOUS
  Administered 2021-08-20 – 2021-08-21 (×2): 5 [IU] via SUBCUTANEOUS
  Administered 2021-08-21: 3 [IU] via SUBCUTANEOUS

## 2021-08-17 MED ORDER — LOPERAMIDE HCL 2 MG PO CAPS: 2.0000 mg | ORAL_CAPSULE | ORAL | Status: DC | PRN

## 2021-08-17 MED ORDER — MUSCLE RUB 10-15 % EX CREA: 1.0000 "application " | TOPICAL_CREAM | Freq: Every day | CUTANEOUS | Status: DC | PRN

## 2021-08-17 MED ORDER — ISOSORBIDE MONONITRATE ER 60 MG PO TB24
120.0000 mg | ORAL_TABLET | Freq: Every day | ORAL | Status: DC
  Administered 2021-08-18 – 2021-08-21 (×4): 120 mg via ORAL
  Filled 2021-08-17: qty 2
  Filled 2021-08-17: qty 4
  Filled 2021-08-17 (×2): qty 2

## 2021-08-17 MED ORDER — BISMUTH SUBSALICYLATE 262 MG/15ML PO SUSP: 30.0000 mL | Freq: Four times a day (QID) | ORAL | Status: DC | PRN

## 2021-08-17 MED ORDER — SODIUM CHLORIDE 0.9 % IV SOLN
100.0000 mg | Freq: Every day | INTRAVENOUS | Status: DC
  Administered 2021-08-18 – 2021-08-20 (×3): 100 mg via INTRAVENOUS
  Filled 2021-08-17 (×3): qty 20
  Filled 2021-08-17: qty 100

## 2021-08-17 MED ORDER — POTASSIUM CHLORIDE CRYS ER 20 MEQ PO TBCR
40.0000 meq | EXTENDED_RELEASE_TABLET | Freq: Once | ORAL | Status: AC
  Administered 2021-08-17: 40 meq via ORAL
  Filled 2021-08-17: qty 2

## 2021-08-17 MED ORDER — BRIMONIDINE TARTRATE 0.2 % OP SOLN
1.0000 [drp] | Freq: Two times a day (BID) | OPHTHALMIC | Status: DC
  Administered 2021-08-18 – 2021-08-21 (×7): 1 [drp] via OPHTHALMIC
  Filled 2021-08-17: qty 5

## 2021-08-17 MED ORDER — ACETAMINOPHEN 500 MG PO TABS: 500.0000 mg | ORAL_TABLET | Freq: Four times a day (QID) | ORAL | Status: DC | PRN

## 2021-08-17 NOTE — H&P (Addendum)
History and Physical    [name] KYH:[id number] DOB: August 03, 1955 DOA: 08/17/2021  PCP: Ladell Pier, MD (Confirm with patient/family/NH records and if not entered, this has to be entered at [facility] point of entry) Patient coming from: Home  I have personally briefly reviewed patient's old medical records in [place]  Chief Complaint: Feeling weak  HPI: [name] is a 66 y.o. female with medical history significant of IDDM, HTN, CAD, CKD stage IIIb, diabetic neuropathy, PSVT on Cardizem and metoprolol, left adnexa mass, came with persistent diarrhea, generalized weakness and new cough.  Patient is not vaccinated for COVID-19.  She started to have a loose bowel movement about 7 days ago, once daily, no abdominal pain no nauseous vomiting.  Since then she noticed her appetite has significantly decreased, denied any abdominal pain, no loss of taste.  No fever or chills.  Family started to notice patient has episode of confusion since yesterday and also starting a new dry cough.  Patient complained about feeling very weak and several episodes of near falls.  ED Course: Borderline bradycardia, no hypotension.  Chest x-ray no clear infiltrates, no hypoxia.  COVID positive.  Blood work potassium 2.6, troponin 57> 949  CT abdomen showed chronic left adnexa mass enlarged in size compared to previous study.  Head CT negative for acute findings.  Review of Systems: As per HPI otherwise 14 point review of systems negative.    Past Medical History:  Diagnosis Date   Blind right eye    Chronic back pain    "my whole back" (03/18/2015)   Chronic chest pain    takes isosorbide   Chronic diastolic heart failure ([place]) 9/13   echo 03/20/15 LV Ef of 60-65%, grade 2DD and  PA peak pressure: 36 mm Hg    CKD (chronic kidney disease), stage III (HCC)    Coronary artery disease    Dr Sallyanne Kuster, cardiac cath 02-07-2009 -- diffuse 3V CAD involving RCA, CFx, and D1,  pt not a candidate  for bypass surgery or angioplasty,  medical management   Diabetic neuropathy ([place])    Diabetic retinopathy ([name])    bilateral proliferative   Diastolic CHF, chronic ([place])    Dyspnea    "always"   Edema of both lower extremities    Generalized weakness    GERD (gastroesophageal reflux disease)    Glaucoma, both eyes    History of non-ST elevation myocardial infarction (NSTEMI)    02-25-2009;  05-15-2009;  01-08-2010;  04-04-2012 this MI was post-op surgery for abscess on 04-03-2012   History of sepsis    Hyperlipemia    Hypertension    Insulin dependent type 2 diabetes mellitus ([place])    followed by pcp   Iron deficiency anemia due to chronic blood loss    uterine bleeding   Legally blind in left eye, as defined in [place]    per pt states vision "is foggy"   Mild obstructive sleep apnea    study in epic 04-19-2012 mild osa,  recommendation's were wt. loss, dental  appliance, surgery, or cpap   Morbid obesity with BMI of 40.0-44.9, adult ([place])    OA (osteoarthritis)    knees   PSVT (paroxysmal supraventricular tachycardia) ([place])    Renal insufficiency    pt. denies   Seasonal allergies    Wears glasses     Past Surgical History:  Procedure Laterality Date   BIOPSY  02/13/2018   Procedure: BIOPSY;  Surgeon: Ladene Artist,  MD;  Location: [facility];  Service: Endoscopy;;   BREAST SURGERY Right 2019    breast biopsy,  per pt benign   CARDIAC CATHETERIZATION  02-27-2009   dr al little   diffuse 3V CAD , involving RCA, CFx, and D1;  inferior wall abnormalities, low normal ef 50%   CATARACT EXTRACTION Right    CATARACT EXTRACTION W/ INTRAOCULAR LENS IMPLANT Left 10-11-2007   _0    COLONOSCOPY WITH PROPOFOL N/A 02/13/2018   Procedure: COLONOSCOPY WITH PROPOFOL;  Surgeon: Ladene Artist, MD;  Location: [facility];  Service: Endoscopy;  Laterality: N/A;   DILATION AND CURETTAGE OF UTERUS  x2 last one 2000   EYE SURGERY Bilateral 2009 to 2010   x2  left eye;  x2  right eye     HYSTEROSCOPY WITH D & C N/A 10/11/2018   Procedure: DILATATION AND CURETTAGE /HYSTEROSCOPY;  Surgeon: [name], MD;  Location: [facility];  Service: Gynecology;  Laterality: N/A;   INCISION AND DRAINAGE PERIRECTAL ABSCESS  04/03/2012   INTRAUTERINE DEVICE (IUD) INSERTION  10/11/2018   Procedure: INTRAUTERINE DEVICE (IUD) INSERTION;  Surgeon: [name], MD;  Location: [facility];  Service: Gynecology;;   IR US GUIDE VASC ACCESS LEFT  02/13/2018   IR VENIPUNCTURE 27YRS/OLDER BY MD  02/13/2018   POLYPECTOMY  02/13/2018   Procedure: POLYPECTOMY;  Surgeon: Ladene Artist, MD;  Location: [facility];  Service: Endoscopy;;     reports that she has never smoked. She has never used smokeless tobacco. She reports that she does not drink alcohol and does not use drugs.  Allergies  Allergen Reactions   Ativan [Lorazepam] Anaphylaxis   Orange Fruit [Citrus] Anaphylaxis and Other (See Comments)    Pt stated throat swelling, itching    Family History  Problem Relation Age of Onset   Diabetes Mother    Heart disease Mother    Heart attack Mother    Hypertension Mother    Breast cancer Mother    Colon polyps Father    Diabetes Father    Heart disease Father    Heart attack Father    Stroke Father    Hypertension Father    Diabetes Brother    Diabetes Sister    Heart disease Brother    Heart disease Sister    Diabetes Maternal Grandmother    Hypertension Maternal Grandmother    Hypertension Brother    Hypertension Sister    Breast cancer Cousin    Colon cancer Cousin    Esophageal cancer Neg Hx    Rectal cancer Neg Hx    Stomach cancer Neg Hx      Prior to Admission medications   Medication Sig Start Date End Date Taking? Authorizing Provider  Accu-Chek Softclix Lancets lancets Use as instructed for 3 times daily blood glucose monitoring 12/24/19   Ladell Pier, MD  acetaminophen (TYLENOL) 500 MG tablet Take 500 mg by mouth every 6 (six) hours as needed for moderate pain or  headache.    [provider]  acetaZOLAMIDE ER (DIAMOX) 500 MG capsule Take 1 capsule (500 mg total) by mouth 2 (two) times daily. 05/23/21   Ladell Pier, MD  aspirin 81 MG EC tablet Take 1 tablet (81 mg total) by mouth daily. 05/23/21   Ladell Pier, MD  atorvastatin (LIPITOR) 80 MG tablet TAKE 1 TABLET BY MOUTH ONCE DAILY IN THE EVENING AT 6  PM Patient taking differently: Take 80 mg by mouth daily. 05/23/21  Ladell Pier, MD  bismuth subsalicylate (PEPTO BISMOL) 262 MG/15ML suspension Take 30 mLs by mouth every 6 (six) hours as needed for indigestion.    [provider]  Blood Glucose Monitoring Suppl (ACCU-CHEK AVIVA PLUS) w/Device KIT Used as directed 09/29/18   Ladell Pier, MD  Blood Pressure Monitoring (BLOOD PRESSURE MONITOR/ARM) DEVI 1 each by Does not apply route daily. ICD 10, I10 and I50.32 03/07/17   Funches, Josalyn, MD  brimonidine-timolol (COMBIGAN) 0.2-0.5 % ophthalmic solution Place 1 drop into both eyes 2 (two) times daily. 04/14/21   Marianna Payment, MD  Continuous Blood Gluc Receiver (FREESTYLE LIBRE 14 DAY READER) DEVI Use to check blood sugar three times daily. 08/14/21   Ladell Pier, MD  Continuous Blood Gluc Sensor (FREESTYLE LIBRE 14 DAY SENSOR) MISC Use to check blood sugar three times daily. 08/14/21   Ladell Pier, MD  diltiazem (CARDIZEM CD) 240 MG 24 hr capsule Take 1 capsule (240 mg total) by mouth daily. 05/23/21   Ladell Pier, MD  glucose blood test strip Use TID before meals / Dx E11.9 04/14/21   Marianna Payment, MD  glucose monitoring kit (FREESTYLE) monitoring kit 1 each by Does not apply route 4 (four) times daily - after meals and at bedtime. 09/12/15   Funches, Adriana Mccallum, MD  insulin aspart (NOVOLOG) 100 UNIT/ML injection CBG 70 - 120: 0 units  CBG 121 - 150: 1 unit  CBG 151 - 200: 2 units  CBG 201 - 250: 3 units  CBG 251 - 300: 5 units  CBG 301 - 350: 7 units  CBG 351 - 400: 9 units 07/16/21   Hosie Poisson, MD  insulin glargine-yfgn (SEMGLEE) 100 UNIT/ML injection Inject 0.2 mLs (20 Units total) into the skin at bedtime. 07/17/21   Hosie Poisson, MD  insulin glargine-yfgn (SEMGLEE) 100 UNIT/ML injection Inject 0.25 mLs (25 Units total) into the skin daily. 07/17/21   Hosie Poisson, MD  Insulin Syringe-Needle U-100 (SAFETY INSULIN SYRINGES) 30G X 1/2" 1 ML MISC Use as directed with insulin 05/23/21   Ladell Pier, MD  isosorbide mononitrate (IMDUR) 60 MG 24 hr tablet Take 2 tablets (120 mg total) by mouth daily. 05/23/21   Ladell Pier, MD  Menthol-Methyl Salicylate (MUSCLE RUB) 10-15 % CREA Apply 1 application topically daily as needed (knee pain). 04/14/21   Marianna Payment, MD  metoprolol (TOPROL-XL) 200 MG 24 hr tablet Take 1 tablet (200 mg total) by mouth daily. . 05/23/21   Ladell Pier, MD  nitroGLYCERIN (NITROSTAT) 0.4 MG SL tablet DISSOLVE ONE TABLET UNDER THE TONGUE EVERY 5 MINUTES AS NEEDED FOR CHEST PAIN.  DO NOT EXCEED A TOTAL OF 3 DOSES IN 15 MINUTES Patient taking differently: 0.4 mg every 5 (five) minutes as needed for chest pain. 04/14/21   Marianna Payment, MD  RELION INSULIN SYRINGE 31G X 15/64" 1 ML MISC USE AS DIRECTED TWICE DAILY FOR INSULIN 04/30/21   Ladell Pier, MD  diltiazem (DILACOR XR) 240 MG 24 hr capsule Take 1 capsule (240 mg total) by mouth daily. 12/03/20 04/14/21  Shawna Clamp, MD    Physical Exam: Vitals:   08/17/21 1300 08/17/21 1327 08/17/21 1415 08/17/21 1445  BP: (!) 114/48  117/61 123/61  Pulse: (!) 57  (!) 55 (!) 59  Resp: _0 Temp:  (!) 97.5 F (36.4 C)    TempSrc:  Rectal    SpO2: 100%  100% 99%  Weight:  Height:        Constitutional: NAD, calm, comfortable Vitals:   08/17/21 1300 08/17/21 1327 08/17/21 1415 08/17/21 1445  BP: (!) 114/48  117/61 123/61  Pulse: (!) 57  (!) 55 (!) 59  Resp: _0 Temp:  (!) 97.5 F (36.4 C)    TempSrc:  Rectal    SpO2: 100%  100% 99%  Weight:      Height:       Eyes:  PERRL, lids and conjunctivae normal ENMT: Mucous membranes are dry. Posterior pharynx clear of any exudate or lesions.Normal dentition.  Neck: normal, supple, no masses, no thyromegaly Respiratory: clear to auscultation bilaterally, no wheezing, no crackles. Normal respiratory effort. No accessory muscle use.  Cardiovascular: Regular rate and rhythm, no murmurs / rubs / gallops. No extremity edema. 2+ pedal pulses. No carotid bruits.  Abdomen: no tenderness, no masses palpated. No hepatosplenomegaly. Bowel sounds positive.  Musculoskeletal: no clubbing / cyanosis. No joint deformity upper and lower extremities. Good ROM, no contractures. Normal muscle tone.  Skin: no rashes, lesions, ulcers. No induration Neurologic: CN 2-12 grossly intact. Sensation intact, DTR normal. Strength 5/5 in all 4.  Psychiatric: Normal judgment and insight. Alert and oriented x 3. Normal mood.     Labs on Admission: I have personally reviewed following labs and imaging studies  CBC: Recent Labs  Lab 08/17/21 1245  WBC 10.9*  NEUTROABS 7.7  HGB 14.3  HCT 44.2  MCV 93.1  PLT 147   Basic Metabolic Panel: Recent Labs  Lab 08/17/21 1245  NA 137  K 2.6*  CL 100  CO2 23  GLUCOSE 85  BUN 20  CREATININE 1.90*  CALCIUM 9.3  MG 2.2   GFR: Estimated Creatinine Clearance: 36.1 mL/min (A) (by C-G formula based on SCr of 1.9 mg/dL (H)). Liver Function Tests: Recent Labs  Lab 08/17/21 1245  AST 28  ALT 16  ALKPHOS 67  BILITOT 0.8  PROT 8.0  ALBUMIN 3.0*   Recent Labs  Lab 08/17/21 1245  LIPASE 37   No results for input(s): AMMONIA in the last 168 hours. Coagulation Profile: No results for input(s): INR, PROTIME in the last 168 hours. Cardiac Enzymes: No results for input(s): CKTOTAL, CKMB, CKMBINDEX, TROPONINI in the last 168 hours. BNP (last 3 results) No results for input(s): PROBNP in the last 8760 hours. HbA1C: No results for input(s): HGBA1C in the last 72 hours. CBG: Recent Labs   Lab 08/17/21 1216  GLUCAP 90   Lipid Profile: No results for input(s): CHOL, HDL, LDLCALC, TRIG, CHOLHDL, LDLDIRECT in the last 72 hours. Thyroid Function Tests: No results for input(s): TSH, T4TOTAL, FREET4, T3FREE, THYROIDAB in the last 72 hours. Anemia Panel: No results for input(s): VITAMINB12, FOLATE, FERRITIN, TIBC, IRON, RETICCTPCT in the last 72 hours. Urine analysis:    Component Value Date/Time   COLORURINE YELLOW 08/17/2021 [place] 08/17/2021 1256   LABSPEC 1.025 08/17/2021 1256   PHURINE 5.5 08/17/2021 1256   GLUCOSEU NEGATIVE 08/17/2021 1256   HGBUR NEGATIVE 08/17/2021 1256   HGBUR large 04/03/2010 0931   BILIRUBINUR NEGATIVE 08/17/2021 1256   KETONESUR NEGATIVE 08/17/2021 1256   PROTEINUR NEGATIVE 08/17/2021 1256   UROBILINOGEN 0.2 03/22/2013 1159   NITRITE NEGATIVE 08/17/2021 1256   LEUKOCYTESUR NEGATIVE 08/17/2021 1256    Radiological Exams on Admission: CT Abdomen Pelvis Wo Contrast  Result Date: 08/17/2021 CLINICAL DATA:  Nonlocalized acute abdomen pain. EXAM: CT ABDOMEN AND PELVIS WITHOUT CONTRAST TECHNIQUE: Multidetector CT imaging  of the abdomen and pelvis was performed following the standard protocol without IV contrast. RADIATION DOSE REDUCTION: This exam was performed according to the departmental dose-optimization program which includes automated exposure control, adjustment of the mA and/or kV according to patient size and/or use of iterative reconstruction technique. COMPARISON:  Nov 30, 2020 FINDINGS: Lower chest: Mild dependent atelectasis of bilateral lung bases are identified. The heart size is enlarged. There is no pericardial effusion. Hepatobiliary: No focal liver abnormality is seen. No gallstones, gallbladder wall thickening, or biliary dilatation. Pancreas: Unremarkable. No pancreatic ductal dilatation or surrounding inflammatory changes. Spleen: Normal in size without focal abnormality. Adrenals/Urinary Tract: The bilateral  adrenal glands are normal. There is no right hydronephrosis. Mild dilatation of the left ureter to the level of the left adnexa is less prominent compared to prior CT. The bladder is decompressed limiting evaluation. Stomach/Bowel: Stomach is within normal limits. The appendix is not seen, no inflammation is noted around cecum. No evidence of bowel wall thickening, distention, or inflammatory changes. Vascular/Lymphatic: Aortic atherosclerosis. No enlarged abdominal or pelvic lymph nodes. Reproductive: Left adnexa complex cystic ovarian mass measuring 10.9 x 5.8 x 11.2 cm enlarged compared to prior CT. Uterine fibroids are identified. Other: Small umbilical herniation of mesenteric fat is identified. Musculoskeletal: Degenerative joint changes of the spine are noted. IMPRESSION: 1. Left adnexa complex cystic ovarian mass measuring 10.9 x 5.8 x 11.2 cm enlarged compared to prior CT. This is suspicious for neoplasm. Gynecologic follow-up is recommended. 2. Mild dilatation of the left ureter to the level of the left adnexa is less prominent compared to prior CT. 3. Uterine fibroids. Aortic Atherosclerosis (ICD10-I70.0). Electronically Signed   By: Abelardo Diesel M.D.   On: 08/17/2021 13:51   CT Head Wo Contrast  Result Date: 08/17/2021 CLINICAL DATA:  Mental status change EXAM: CT HEAD WITHOUT CONTRAST TECHNIQUE: Contiguous axial images were obtained from the base of the skull through the vertex without intravenous contrast. RADIATION DOSE REDUCTION: This exam was performed according to the departmental dose-optimization program which includes automated exposure control, adjustment of the mA and/or kV according to patient size and/or use of iterative reconstruction technique. COMPARISON:  07/10/2021 FINDINGS: Brain: No evidence of acute infarction, hemorrhage, hydrocephalus, extra-axial collection or mass lesion/mass effect. Vascular: No hyperdense vessel or unexpected calcification. Skull: Normal. Negative for  fracture or focal lesion. Sinuses/Orbits: Bubbly opacities are identified within bilateral maxillary and sphenoid sinuses with air-fluid levels. Partial opacification of the anterior ethmoid air cells. Mastoid air cells are clear. Diffuse high attenuation throughout the right globe is again noted and appears unchanged compatible with previous enucleation. Left globe appears normal. Other: None IMPRESSION: 1. No acute intracranial abnormalities. 2. Bilateral maxillary and sphenoid sinus inflammation with air-fluid levels. Electronically Signed   By: Kerby Moors M.D.   On: 08/17/2021 13:41   DG CHEST PORT 1 VIEW  Result Date: 08/17/2021 CLINICAL DATA:  AMS, fatigue, COVID positive. EXAM: PORTABLE CHEST 1 VIEW COMPARISON:  CT abdomen pelvis dated August 17, 2021 and chest radiograph dated July 10, 2021 FINDINGS: The heart is enlarged. Low lung volumes with bibasilar atelectasis. No large pleural effusion or pneumothorax. IMPRESSION: 1.  Stable cardiomegaly. 2. Low lung volumes with bibasilar atelectasis. No large pleural effusion or pneumothorax. Electronically Signed   By: Keane Police D.O.   On: 08/17/2021 14:42    EKG: Independently reviewed.  Chronic RBBB.  Assessment/Plan Principal Problem:   COVID Active Problems:   COVID-19 virus infection  (please populate well all problems here  in Problem List. (For example, if patient is on BP meds at home and you resume or decide to hold them, it is a problem that needs to be her. Same for CAD, COPD, HLD and so on)  Severe hypokalemia -Likely multifactorial from prolonged diarrhea and use of Diamox. -IV and p.o. placed in the ED, check magnesium and phosphorus level And recheck potassium level tonight -Discontinue Diamox as CO2 level stable.  COVID-19 infection -Might be the new variant causing the ongoing diarrhea -Given her secondary risk factor of deteriorating, such as diabetes hypertension obesity, and none vaccination, proposed to treat  this remdesivir, patient expressed understanding and agreed. -No significant breathing symptoms other than dry cough, will hold off steroids at this point.  Dehydration -Secondary from poor oral intake and diarrhea -IV fluid x1 day then reevaluate.  Positive troponins -Probably related to demand ischemia from COVID infection, hypovolemia and CKD. -No chest pain or acute ST changes on EKG.  Trending of troponins flat pattern, ACS unlikely.  We will check an echocardiogram, likely can be followed up as outpatient for stress test.  PSVT, HTN -Blood pressure borderline low, with borderline bradycardia -We will hold off metoprolol, change long-acting Cardizem to every 6H  CKD stage IIIb -Clinically appears mild hypovolemic, on IV fluid.  Creatinine level stable.  Morbid obesity -Calorie control recommended  DVT prophylaxis: Heparin subcu Code Status: Full code Family Communication: Left husband message Disposition Plan: Expect less than 2 midnight hospital stay, once potassium level recovers, likely can go home and complete remdesivir treatment as outpatient. Consults called: None Admission status: Medsurg obs   Lequita Halt MD [facility] Pager [phone]  08/17/2021, 4:18 PM

## 2021-08-17 NOTE — ED Triage Notes (Signed)
BIB EMS, per husband pt began "acting different" yesterday after breakfast, not talking as much, lethargy. Pt was at rehab @ [facility] recently and pt husband thinks she should not have been sent home. Pt able to answer general questions and follow commands for EMS. No unilateral weakness.

## 2021-08-17 NOTE — ED Provider Notes (Signed)
[facility] EMERGENCY DEPARTMENT Provider Note   CSN: [id number] Arrival date & time: 08/17/21  1208     History  Chief Complaint  Patient presents with   Fatigue   Altered Mental Status    [name] is a 66 y.o. female.  Patient is a 66 year old female who presents with altered mental status.  History is obtained by EMS.  Reportedly patient was recently discharged from rehab at [facility] care.  She is currently at home with her husband.  She has been acting differently since yesterday morning.  Apparently she ate breakfast okay but then started becoming increasingly more fatigued throughout the day and today.  She has not been eating as much.  She is not as alert.  She is not answering questions like she normally does.  Per chart review, patient has a history of hypertension, hyperlipidemia, GERD, CHF, chronic kidney disease, diabetes.  When I asked her if she hurts, she says yes.  She points to her chest area.  History is somewhat limited due to her altered status.  It is noted per chart review that she had a recent history for hyperglycemia and somnolence with encephalopathy.  This was corrected and she was discharged to go over health care for rehab.    Home Medications Prior to Admission medications   Medication Sig Start Date End Date Taking? Authorizing Provider  Accu-Chek Softclix Lancets lancets Use as instructed for 3 times daily blood glucose monitoring 12/24/19   Ladell Pier, MD  acetaminophen (TYLENOL) 500 MG tablet Take 500 mg by mouth every 6 (six) hours as needed for moderate pain or headache.    [provider]  acetaZOLAMIDE ER (DIAMOX) 500 MG capsule Take 1 capsule (500 mg total) by mouth 2 (two) times daily. 05/23/21   Ladell Pier, MD  aspirin 81 MG EC tablet Take 1 tablet (81 mg total) by mouth daily. 05/23/21   Ladell Pier, MD  atorvastatin (LIPITOR) 80 MG tablet TAKE 1 TABLET BY MOUTH ONCE DAILY IN THE  EVENING AT 6  PM Patient taking differently: Take 80 mg by mouth daily. 05/23/21   Ladell Pier, MD  bismuth subsalicylate (PEPTO BISMOL) 262 MG/15ML suspension Take 30 mLs by mouth every 6 (six) hours as needed for indigestion.    [provider]  Blood Glucose Monitoring Suppl (ACCU-CHEK AVIVA PLUS) w/Device KIT Used as directed 09/29/18   Ladell Pier, MD  Blood Pressure Monitoring (BLOOD PRESSURE MONITOR/ARM) DEVI 1 each by Does not apply route daily. ICD 10, I10 and I50.32 03/07/17   Funches, Josalyn, MD  brimonidine-timolol (COMBIGAN) 0.2-0.5 % ophthalmic solution Place 1 drop into both eyes 2 (two) times daily. 04/14/21   Marianna Payment, MD  Continuous Blood Gluc Receiver (FREESTYLE LIBRE 14 DAY READER) DEVI Use to check blood sugar three times daily. 08/14/21   Ladell Pier, MD  Continuous Blood Gluc Sensor (FREESTYLE LIBRE 14 DAY SENSOR) MISC Use to check blood sugar three times daily. 08/14/21   Ladell Pier, MD  diltiazem (CARDIZEM CD) 240 MG 24 hr capsule Take 1 capsule (240 mg total) by mouth daily. 05/23/21   Ladell Pier, MD  glucose blood test strip Use TID before meals / Dx E11.9 04/14/21   Marianna Payment, MD  glucose monitoring kit (FREESTYLE) monitoring kit 1 each by Does not apply route 4 (four) times daily - after meals and at bedtime. 09/12/15   Boykin Nearing, MD  insulin aspart (NOVOLOG)  100 UNIT/ML injection CBG 70 - 120: 0 units  CBG 121 - 150: 1 unit  CBG 151 - 200: 2 units  CBG 201 - 250: 3 units  CBG 251 - 300: 5 units  CBG 301 - 350: 7 units  CBG 351 - 400: 9 units 07/16/21   Hosie Poisson, MD  insulin glargine-yfgn (SEMGLEE) 100 UNIT/ML injection Inject 0.2 mLs (20 Units total) into the skin at bedtime. 07/17/21   Hosie Poisson, MD  insulin glargine-yfgn (SEMGLEE) 100 UNIT/ML injection Inject 0.25 mLs (25 Units total) into the skin daily. 07/17/21   Hosie Poisson, MD  Insulin Syringe-Needle U-100 (SAFETY INSULIN SYRINGES) 30G X 1/2" 1  ML MISC Use as directed with insulin 05/23/21   Ladell Pier, MD  isosorbide mononitrate (IMDUR) 60 MG 24 hr tablet Take 2 tablets (120 mg total) by mouth daily. 05/23/21   Ladell Pier, MD  Menthol-Methyl Salicylate (MUSCLE RUB) 10-15 % CREA Apply 1 application topically daily as needed (knee pain). 04/14/21   Marianna Payment, MD  metoprolol (TOPROL-XL) 200 MG 24 hr tablet Take 1 tablet (200 mg total) by mouth daily. . 05/23/21   Ladell Pier, MD  nitroGLYCERIN (NITROSTAT) 0.4 MG SL tablet DISSOLVE ONE TABLET UNDER THE TONGUE EVERY 5 MINUTES AS NEEDED FOR CHEST PAIN.  DO NOT EXCEED A TOTAL OF 3 DOSES IN 15 MINUTES Patient taking differently: 0.4 mg every 5 (five) minutes as needed for chest pain. 04/14/21   Marianna Payment, MD  RELION INSULIN SYRINGE 31G X 15/64" 1 ML MISC USE AS DIRECTED TWICE DAILY FOR INSULIN 04/30/21   Ladell Pier, MD  diltiazem (DILACOR XR) 240 MG 24 hr capsule Take 1 capsule (240 mg total) by mouth daily. 12/03/20 04/14/21  Shawna Clamp, MD      Allergies    Ativan [lorazepam] and Orange fruit [citrus]    Review of Systems   Review of Systems  Unable to perform ROS: Mental status change   Physical Exam Updated Vital Signs BP 123/61    Pulse (!) 59    Temp (!) 97.5 F (36.4 C) (Rectal)    Resp 16    Ht '5\' 3"'  (1.6 m)    Wt 118 kg    LMP 04/02/2012    SpO2 99%    BMI 46.08 kg/m  Physical Exam Constitutional:      Appearance: She is well-developed.  HENT:     Head: Normocephalic and atraumatic.  Eyes:     Pupils: Pupils are equal, round, and reactive to light.  Cardiovascular:     Rate and Rhythm: Normal rate and regular rhythm.     Heart sounds: Normal heart sounds.  Pulmonary:     Effort: Pulmonary effort is normal. No respiratory distress.     Breath sounds: Normal breath sounds. No wheezing or rales.  Chest:     Chest wall: No tenderness.  Abdominal:     General: Bowel sounds are normal.     Palpations: Abdomen is soft.     Tenderness:  There is no abdominal tenderness. There is no guarding or rebound.  Musculoskeletal:        General: Normal range of motion.     Cervical back: Normal range of motion and neck supple.  Lymphadenopathy:     Cervical: No cervical adenopathy.  Skin:    General: Skin is warm and dry.     Findings: No rash.  Neurological:     Mental Status: She is alert.  Comments: Patient is slow to answer questions.  She will answer some questions.  She is oriented to person and place but confused to date.  She is moving all extremities symmetrically without obvious focal deficits.    ED Results / Procedures / Treatments   Labs (all labs ordered are listed, but only abnormal results are displayed) Labs Reviewed  RESP PANEL BY RT-PCR (FLU A&B, COVID) ARPGX2 - Abnormal; Notable for the following components:      Result Value   SARS Coronavirus 2 by RT PCR POSITIVE (*)    All other components within normal limits  COMPREHENSIVE METABOLIC PANEL - Abnormal; Notable for the following components:   Potassium 2.6 (*)    Creatinine, Ser 1.90 (*)    Albumin 3.0 (*)    GFR, Estimated 29 (*)    All other components within normal limits  LACTIC ACID, PLASMA - Abnormal; Notable for the following components:   Lactic Acid, Venous 2.9 (*)    All other components within normal limits  CBC WITH DIFFERENTIAL/PLATELET - Abnormal; Notable for the following components:   WBC 10.9 (*)    All other components within normal limits  TROPONIN I (HIGH SENSITIVITY) - Abnormal; Notable for the following components:   Troponin I (High Sensitivity) 57 (*)    All other components within normal limits  LIPASE, BLOOD  URINALYSIS, ROUTINE W REFLEX MICROSCOPIC  LACTIC ACID, PLASMA  MAGNESIUM  AMMONIA  PHOSPHORUS  CBG MONITORING, ED  TROPONIN I (HIGH SENSITIVITY)    EKG EKG Interpretation  Date/Time:  Monday August 17 2021 12:14:03 EST Ventricular Rate:  56 PR Interval:  151 QRS Duration: 167 QT Interval:  487 QTC  Calculation: 470 R Axis:   11 Text Interpretation: Sinus rhythm Right bundle branch block LVH with IVCD and secondary repol abnrm since last tracing no significant change Confirmed by Malvin Johns [phone]) on 08/17/2021 1:47:06 PM  Radiology CT Abdomen Pelvis Wo Contrast  Result Date: 08/17/2021 CLINICAL DATA:  Nonlocalized acute abdomen pain. EXAM: CT ABDOMEN AND PELVIS WITHOUT CONTRAST TECHNIQUE: Multidetector CT imaging of the abdomen and pelvis was performed following the standard protocol without IV contrast. RADIATION DOSE REDUCTION: This exam was performed according to the departmental dose-optimization program which includes automated exposure control, adjustment of the mA and/or kV according to patient size and/or use of iterative reconstruction technique. COMPARISON:  Nov 30, 2020 FINDINGS: Lower chest: Mild dependent atelectasis of bilateral lung bases are identified. The heart size is enlarged. There is no pericardial effusion. Hepatobiliary: No focal liver abnormality is seen. No gallstones, gallbladder wall thickening, or biliary dilatation. Pancreas: Unremarkable. No pancreatic ductal dilatation or surrounding inflammatory changes. Spleen: Normal in size without focal abnormality. Adrenals/Urinary Tract: The bilateral adrenal glands are normal. There is no right hydronephrosis. Mild dilatation of the left ureter to the level of the left adnexa is less prominent compared to prior CT. The bladder is decompressed limiting evaluation. Stomach/Bowel: Stomach is within normal limits. The appendix is not seen, no inflammation is noted around cecum. No evidence of bowel wall thickening, distention, or inflammatory changes. Vascular/Lymphatic: Aortic atherosclerosis. No enlarged abdominal or pelvic lymph nodes. Reproductive: Left adnexa complex cystic ovarian mass measuring 10.9 x 5.8 x 11.2 cm enlarged compared to prior CT. Uterine fibroids are identified. Other: Small umbilical herniation of mesenteric  fat is identified. Musculoskeletal: Degenerative joint changes of the spine are noted. IMPRESSION: 1. Left adnexa complex cystic ovarian mass measuring 10.9 x 5.8 x 11.2 cm enlarged compared to prior CT.  This is suspicious for neoplasm. Gynecologic follow-up is recommended. 2. Mild dilatation of the left ureter to the level of the left adnexa is less prominent compared to prior CT. 3. Uterine fibroids. Aortic Atherosclerosis (ICD10-I70.0). Electronically Signed   By: Abelardo Diesel M.D.   On: 08/17/2021 13:51   CT Head Wo Contrast  Result Date: 08/17/2021 CLINICAL DATA:  Mental status change EXAM: CT HEAD WITHOUT CONTRAST TECHNIQUE: Contiguous axial images were obtained from the base of the skull through the vertex without intravenous contrast. RADIATION DOSE REDUCTION: This exam was performed according to the departmental dose-optimization program which includes automated exposure control, adjustment of the mA and/or kV according to patient size and/or use of iterative reconstruction technique. COMPARISON:  07/10/2021 FINDINGS: Brain: No evidence of acute infarction, hemorrhage, hydrocephalus, extra-axial collection or mass lesion/mass effect. Vascular: No hyperdense vessel or unexpected calcification. Skull: Normal. Negative for fracture or focal lesion. Sinuses/Orbits: Bubbly opacities are identified within bilateral maxillary and sphenoid sinuses with air-fluid levels. Partial opacification of the anterior ethmoid air cells. Mastoid air cells are clear. Diffuse high attenuation throughout the right globe is again noted and appears unchanged compatible with previous enucleation. Left globe appears normal. Other: None IMPRESSION: 1. No acute intracranial abnormalities. 2. Bilateral maxillary and sphenoid sinus inflammation with air-fluid levels. Electronically Signed   By: Kerby Moors M.D.   On: 08/17/2021 13:41   DG CHEST PORT 1 VIEW  Result Date: 08/17/2021 CLINICAL DATA:  AMS, fatigue, COVID positive.  EXAM: PORTABLE CHEST 1 VIEW COMPARISON:  CT abdomen pelvis dated August 17, 2021 and chest radiograph dated July 10, 2021 FINDINGS: The heart is enlarged. Low lung volumes with bibasilar atelectasis. No large pleural effusion or pneumothorax. IMPRESSION: 1.  Stable cardiomegaly. 2. Low lung volumes with bibasilar atelectasis. No large pleural effusion or pneumothorax. Electronically Signed   By: Keane Police D.O.   On: 08/17/2021 14:42    Procedures Procedures    Medications Ordered in ED Medications  sodium chloride 0.9 % bolus 500 mL (has no administration in time range)  potassium chloride 10 mEq in 100 mL IVPB (has no administration in time range)  potassium chloride SA (KLOR-CON M) CR tablet 40 mEq (has no administration in time range)    ED Course/ Medical Decision Making/ A&P                           Medical Decision Making Amount and/or Complexity of Data Reviewed Independent Historian: spouse and EMS External Data Reviewed: labs, ECG and notes. Labs: ordered. Decision-making details documented in ED Course. Radiology: ordered and independent interpretation performed. Decision-making details documented in ED Course. ECG/medicine tests: ordered and independent interpretation performed. Decision-making details documented in ED Course.  Risk Drug therapy requiring intensive monitoring for toxicity. Decision regarding hospitalization.   Patient is a 66 year old female who presents with decreased mental status and generalized weakness.  I spoke with her husband to get more information.  He states that she has been having decline since yesterday.  She has not really been eating or drinking.  She has been relatively immobile since being discharged from [place] care.  I reviewed her records and her last admission was for hyperglycemia/HHS.  Today she initially was not super responsive.  She would wake up and answer some questions but was quite somnolent.  This actually has  improved since she has been in the ED.  Her lactate is mildly elevated but she does not have  any fever or other suggestions of sepsis.  Rectal temp was normal.  Her potassium is low at 2.6.  She was started on both IV and oral potassium replacement.  Magnesium level is pending.  Her troponin is mildly elevated.  Her EKG does not show any ischemic changes.  Second troponin is pending.  This was interpreted by me.  Her chest x-ray does not show any evidence of pneumonia.  Her COVID test is positive which is likely the etiology of a lot of her symptoms.  She did have a CT scan due to some abdominal pain.  This shows a left adnexal mass concerning for neoplasm.  She will need outpatient follow-up for this.  I feel given her overall constellation of symptoms and lab findings, she would benefit from hospitalization.  I spoke with Dr. Roosevelt Locks who will admit the patient for further treatment.  Final Clinical Impression(s) / ED Diagnoses Final diagnoses:  COVID-19 virus infection  Hypokalemia  Elevated troponin  Adnexal mass  NSTEMI (non-ST elevated myocardial infarction) [facility])    Rx / [place] Orders ED Discharge Orders     None         Malvin Johns, MD 08/17/21 1514

## 2021-08-18 ENCOUNTER — Observation Stay (HOSPITAL_COMMUNITY): Payer: Medicare Other

## 2021-08-18 ENCOUNTER — Other Ambulatory Visit: Payer: Self-pay

## 2021-08-18 DIAGNOSIS — Z800 Family history of malignant neoplasm of digestive organs: Secondary | ICD-10-CM | POA: Diagnosis not present

## 2021-08-18 DIAGNOSIS — N1832 Chronic kidney disease, stage 3b: Secondary | ICD-10-CM | POA: Diagnosis present

## 2021-08-18 DIAGNOSIS — Z823 Family history of stroke: Secondary | ICD-10-CM | POA: Diagnosis not present

## 2021-08-18 DIAGNOSIS — E860 Dehydration: Secondary | ICD-10-CM | POA: Diagnosis present

## 2021-08-18 DIAGNOSIS — E861 Hypovolemia: Secondary | ICD-10-CM | POA: Diagnosis present

## 2021-08-18 DIAGNOSIS — I471 Supraventricular tachycardia: Secondary | ICD-10-CM | POA: Diagnosis present

## 2021-08-18 DIAGNOSIS — I248 Other forms of acute ischemic heart disease: Secondary | ICD-10-CM | POA: Diagnosis present

## 2021-08-18 DIAGNOSIS — N179 Acute kidney failure, unspecified: Secondary | ICD-10-CM | POA: Diagnosis present

## 2021-08-18 DIAGNOSIS — Z833 Family history of diabetes mellitus: Secondary | ICD-10-CM | POA: Diagnosis not present

## 2021-08-18 DIAGNOSIS — I2510 Atherosclerotic heart disease of native coronary artery without angina pectoris: Secondary | ICD-10-CM | POA: Diagnosis present

## 2021-08-18 DIAGNOSIS — E1140 Type 2 diabetes mellitus with diabetic neuropathy, unspecified: Secondary | ICD-10-CM | POA: Diagnosis present

## 2021-08-18 DIAGNOSIS — R778 Other specified abnormalities of plasma proteins: Secondary | ICD-10-CM | POA: Diagnosis not present

## 2021-08-18 DIAGNOSIS — Z28310 Unvaccinated for covid-19: Secondary | ICD-10-CM | POA: Diagnosis not present

## 2021-08-18 DIAGNOSIS — I252 Old myocardial infarction: Secondary | ICD-10-CM | POA: Diagnosis not present

## 2021-08-18 DIAGNOSIS — Z8249 Family history of ischemic heart disease and other diseases of the circulatory system: Secondary | ICD-10-CM | POA: Diagnosis not present

## 2021-08-18 DIAGNOSIS — U071 COVID-19: Secondary | ICD-10-CM | POA: Diagnosis not present

## 2021-08-18 DIAGNOSIS — E11319 Type 2 diabetes mellitus with unspecified diabetic retinopathy without macular edema: Secondary | ICD-10-CM | POA: Diagnosis present

## 2021-08-18 DIAGNOSIS — Z6841 Body Mass Index (BMI) 40.0 and over, adult: Secondary | ICD-10-CM | POA: Diagnosis not present

## 2021-08-18 DIAGNOSIS — A0839 Other viral enteritis: Secondary | ICD-10-CM | POA: Diagnosis present

## 2021-08-18 DIAGNOSIS — I5032 Chronic diastolic (congestive) heart failure: Secondary | ICD-10-CM | POA: Diagnosis present

## 2021-08-18 DIAGNOSIS — E876 Hypokalemia: Secondary | ICD-10-CM | POA: Diagnosis present

## 2021-08-18 DIAGNOSIS — I130 Hypertensive heart and chronic kidney disease with heart failure and stage 1 through stage 4 chronic kidney disease, or unspecified chronic kidney disease: Secondary | ICD-10-CM | POA: Diagnosis present

## 2021-08-18 DIAGNOSIS — Z803 Family history of malignant neoplasm of breast: Secondary | ICD-10-CM | POA: Diagnosis not present

## 2021-08-18 DIAGNOSIS — E1122 Type 2 diabetes mellitus with diabetic chronic kidney disease: Secondary | ICD-10-CM | POA: Diagnosis present

## 2021-08-18 LAB — CBG MONITORING, ED
Glucose-Capillary: 156 mg/dL — ABNORMAL HIGH (ref 70–99)
Glucose-Capillary: 221 mg/dL — ABNORMAL HIGH (ref 70–99)

## 2021-08-18 LAB — COMPREHENSIVE METABOLIC PANEL
ALT: 12 U/L (ref 0–44)
AST: 16 U/L (ref 15–41)
Albumin: 2.5 g/dL — ABNORMAL LOW (ref 3.5–5.0)
Alkaline Phosphatase: 55 U/L (ref 38–126)
Anion gap: 11 (ref 5–15)
BUN: 21 mg/dL (ref 8–23)
CO2: 22 mmol/L (ref 22–32)
Calcium: 8.3 mg/dL — ABNORMAL LOW (ref 8.9–10.3)
Chloride: 105 mmol/L (ref 98–111)
Creatinine, Ser: 1.52 mg/dL — ABNORMAL HIGH (ref 0.44–1.00)
GFR, Estimated: 38 mL/min — ABNORMAL LOW (ref >60)
Glucose, Bld: 122 mg/dL — ABNORMAL HIGH (ref 70–99)
Potassium: 2.8 mmol/L — ABNORMAL LOW (ref 3.5–5.1)
Sodium: 138 mmol/L (ref 135–145)
Total Bilirubin: 0.5 mg/dL (ref 0.3–1.2)
Total Protein: 6.5 g/dL (ref 6.5–8.1)

## 2021-08-18 LAB — CBC WITH DIFFERENTIAL/PLATELET
Abs Immature Granulocytes: 0.03 10*3/uL (ref 0.00–0.07)
Basophils Absolute: 0 10*3/uL (ref 0.0–0.1)
Basophils Relative: 0 %
Eosinophils Absolute: 0.1 10*3/uL (ref 0.0–0.5)
Eosinophils Relative: 1 %
HCT: 37.3 % (ref 36.0–46.0)
Hemoglobin: 12.3 g/dL (ref 12.0–15.0)
Immature Granulocytes: 0 %
Lymphocytes Relative: 28 %
Lymphs Abs: 2.3 10*3/uL (ref 0.7–4.0)
MCH: 30.8 pg (ref 26.0–34.0)
MCHC: 33 g/dL (ref 30.0–36.0)
MCV: 93.5 fL (ref 80.0–100.0)
Monocytes Absolute: 0.8 10*3/uL (ref 0.1–1.0)
Monocytes Relative: 10 %
Neutro Abs: 4.9 10*3/uL (ref 1.7–7.7)
Neutrophils Relative %: 61 %
Platelets: 189 10*3/uL (ref 150–400)
RBC: 3.99 MIL/uL (ref 3.87–5.11)
RDW: 14.5 % (ref 11.5–15.5)
WBC: 8.2 10*3/uL (ref 4.0–10.5)
nRBC: 0 % (ref 0.0–0.2)

## 2021-08-18 LAB — ECHOCARDIOGRAM COMPLETE
Area-P 1/2: 2.68 cm2
Height: 63 in
S' Lateral: 2 cm
Weight: 4162.28 oz

## 2021-08-18 LAB — GLUCOSE, CAPILLARY
Glucose-Capillary: 212 mg/dL — ABNORMAL HIGH (ref 70–99)
Glucose-Capillary: 218 mg/dL — ABNORMAL HIGH (ref 70–99)

## 2021-08-18 LAB — POTASSIUM: Potassium: 4.7 mmol/L (ref 3.5–5.1)

## 2021-08-18 LAB — PHOSPHORUS: Phosphorus: 3.8 mg/dL (ref 2.5–4.6)

## 2021-08-18 LAB — PROCALCITONIN: Procalcitonin: <0.1 ng/mL

## 2021-08-18 LAB — D-DIMER, QUANTITATIVE: D-Dimer, Quant: 1.22 ug/mL-FEU — ABNORMAL HIGH (ref 0.00–0.50)

## 2021-08-18 LAB — C-REACTIVE PROTEIN: CRP: 1.1 mg/dL — ABNORMAL HIGH (ref <1.0)

## 2021-08-18 LAB — MAGNESIUM: Magnesium: 2 mg/dL (ref 1.7–2.4)

## 2021-08-18 LAB — FERRITIN: Ferritin: 209 ng/mL (ref 11–307)

## 2021-08-18 MED ORDER — POTASSIUM CHLORIDE 10 MEQ/100ML IV SOLN
10.0000 meq | INTRAVENOUS | Status: AC
  Administered 2021-08-18 (×3): 10 meq via INTRAVENOUS
  Filled 2021-08-18 (×3): qty 100

## 2021-08-18 MED ORDER — POTASSIUM CHLORIDE CRYS ER 20 MEQ PO TBCR
40.0000 meq | EXTENDED_RELEASE_TABLET | ORAL | Status: AC
  Administered 2021-08-18 (×2): 40 meq via ORAL
  Filled 2021-08-18 (×2): qty 2

## 2021-08-18 NOTE — Progress Notes (Signed)
Echocardiogram 2D Echocardiogram has been performed.  Arlyss Gandy 08/18/2021, 10:51 AM

## 2021-08-18 NOTE — Progress Notes (Signed)
PROGRESS NOTE    [name]  CVE:[id number] DOB: 04/11/1956 DOA: 08/17/2021 PCP: Ladell Pier, MD   Chief Complaint  Patient presents with   Fatigue   Altered Mental Status  Brief Narrative/Hospital Course: [name], 66 y.o. 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  ED Course: Borderline bradycardia, no hypotension.  Chest x-ray no clear infiltrates, no hypoxia. COVID positive.Blood work potassium 2.6, troponin 57> 49 CT abdomen showed chronic left adnexa mass enlarged in size compared to previous study.  Head CT negative for acute findings.   Subjective: Seen and examined this morning.  Patient reports decrease in her diarrhea.  Getting echo done. Complains of extreme fatigue and weakness. Overnight blood pressure 90s to 100, on room air no fever Potassium 2.8 around 6 AM. Creatinine improving 1.5  Assessment & Plan:  COVID-19 virus infection: Without hypoxia.  Continue remdesivir, aspirin precaution Persistent diarrhea likely from above supportive care monitoring CRP D-dimer ferritin. Hypokalemia due to diarrhea, being replaced aggressively. Dehydration continue IV hydration encourage oral intake  Elevated troponin: Mildly elevated and flat likely demand ischemia in the setting of COVID infection no chest pain no abnormal changes on the EKG. follow-up echocardiogram.  PSVT/HTN: Blood pressure soft holding metoprolol change long-acting Cardizem to every 6 hour continue to holding parameters  CKD stage IIIb overall creatinine downtrending on IV fluid hydration  Class III Obesity:Patient's Body mass index is 46.08 kg/m. : Will benefit with PCP follow-up, weight loss  healthy lifestyle and outpatient sleep evaluation.  Physical deconditioning  severe with weakness debility in the setting of COVID-19 vaccine obtain PT OT evaluation  DVT prophylaxis: heparin injection 5,000 Units Start: 08/17/21 1700 Code Status:   Code Status: Full Code Family Communication: plan of care discussed with patient at bedside. Status is: Observation Remains hospitalized for ongoing management of diarrhea persistent electrolyte imbalance severe deconditioning debility Disposition: Currently NOT medically stable for discharge. Anticipated Disposition: snf  Total time spent in the care of this patient 50 MINUTES Objective: Vitals last 24 hrs: Vitals:   08/18/21 1030 08/18/21 1100 08/18/21 1130 08/18/21 1200  BP: 106/63 (!) 114/57 121/61 (!) 116/58  Pulse: 71 69 63 62  Resp: 17 15 17 17   Temp:      TempSrc:      SpO2: 100% 100% 100% 97%  Weight:      Height:       Weight change:  No intake or output data in the 24 hours ending 08/18/21 1317 Net IO Since Admission: No IO data has been entered for this period [08/18/21 1317]   Physical Examination: General exam: AA0x3, weak,older than stated age. HEENT:Oral mucosa moist, Ear/Nose WNL grossly,dentition normal. Respiratory system: B/l clear BS, no use of accessory muscle, non tender. Cardiovascular system: S1 & S2 +,No JVD. Gastrointestinal system: Abdomen soft, NT,ND, BS+. Nervous System:Alert, awake, moving extremities. Extremities: edema none, distal peripheral pulses palpable.  Skin: No rashes, no icterus. MSK: Normal muscle bulk, tone, power.  Medications reviewed:  Scheduled Meds:  aspirin EC  81 mg Oral Daily   atorvastatin  80 mg Oral Daily   brimonidine  1 drop Both Eyes BID   diltiazem  60 mg Oral Q6H   heparin  5,000 Units Subcutaneous Q12H   insulin aspart  0-9 Units Subcutaneous TID WC  isosorbide mononitrate  120 mg Oral Daily   potassium chloride  40 mEq Oral Q4H   timolol  1 drop Both Eyes BID   Continuous Infusions:  sodium chloride 100 mL/hr at 08/18/21 9798    remdesivir 100 mg in NS 100 mL 100 mg (08/18/21 1214)   Diet Order             Diet renal/carb modified with fluid restriction Diet-HS Snack? Nothing; Fluid restriction: 1200 mL Fluid; Room service appropriate? Yes; Fluid consistency: Thin  Diet effective now                 Weight change:   Wt Readings from Last 3 Encounters:  08/17/21 118 kg  07/10/21 117.9 kg  04/15/21 117 kg     Consultants:see note  Procedures:see note Antimicrobials: Anti-infectives (From admission, onward)    Start     Dose/Rate Route Frequency Ordered Stop   08/18/21 1000  remdesivir 100 mg in sodium chloride 0.9 % 100 mL IVPB       See Hyperspace for full Linked Orders Report.   100 mg 200 mL/hr over 30 Minutes Intravenous Daily 08/17/21 1606 08/22/21 0959   08/17/21 1900  remdesivir 200 mg in sodium chloride 0.9% 250 mL IVPB       See Hyperspace for full Linked Orders Report.   200 mg 580 mL/hr over 30 Minutes Intravenous Once 08/17/21 1606 08/17/21 2104      Culture/Microbiology Other culture-see note Unresulted Labs (From admission, onward)     Start     Ordered   08/18/21 0500  CBC with Differential/Platelet  Daily,   R      08/17/21 1537   08/18/21 0500  Comprehensive metabolic panel  Daily,   R      08/17/21 1537   08/18/21 0500  C-reactive protein  Daily,   R      08/17/21 1537   08/18/21 0500  D-dimer, quantitative  Daily,   R      08/17/21 1537   08/18/21 0500  Ferritin  Daily,   R      08/17/21 1537   08/18/21 0500  Magnesium  Daily,   R      08/17/21 1537   08/18/21 0500  Phosphorus  Daily,   R      08/17/21 1537   08/17/21 2100  Ammonia  Once,   R        08/17/21 2100   08/17/21 2000  Potassium  Once-Timed,   TIMED        08/17/21 1624           Data Reviewed: I have personally reviewed following labs and imaging studies CBC: Recent Labs  Lab 08/17/21 1245 08/18/21 0556  WBC 10.9* 8.2  NEUTROABS 7.7 4.9  HGB 14.3 12.3  HCT 44.2 37.3  MCV 93.1 93.5  PLT  236 921   Basic Metabolic Panel: Recent Labs  Lab 08/17/21 1245 08/17/21 1510 08/18/21 0556  NA 137  --  138  K 2.6*  --  2.8*  CL 100  --  105  CO2 23  --  22  GLUCOSE 85  --  122*  BUN 20  --  21  CREATININE 1.90*  --  1.52*  CALCIUM 9.3  --  8.3*  MG 2.2  --  2.0  PHOS  --  3.9 3.8   GFR: Estimated Creatinine Clearance: 45.2 mL/min (A) (by C-G formula based on SCr of 1.52 mg/dL (H)). Liver  Function Tests: Recent Labs  Lab 08/17/21 1245 08/18/21 0556  AST 28 16  ALT 16 12  ALKPHOS 67 55  BILITOT 0.8 0.5  PROT 8.0 6.5  ALBUMIN 3.0* 2.5*   Recent Labs  Lab 08/17/21 1245  LIPASE 37   Recent Labs  Lab 08/17/21 1724  AMMONIA 55*   Coagulation Profile: No results for input(s): INR, PROTIME in the last 168 hours. Cardiac Enzymes: No results for input(s): CKTOTAL, CKMB, CKMBINDEX, TROPONINI in the last 168 hours. BNP (last 3 results) No results for input(s): PROBNP in the last 8760 hours. HbA1C: No results for input(s): HGBA1C in the last 72 hours. CBG: Recent Labs  Lab 08/17/21 1216 08/17/21 2009 08/18/21 0811 08/18/21 1311  GLUCAP 90 122* 156* 221*   Lipid Profile: No results for input(s): CHOL, HDL, LDLCALC, TRIG, CHOLHDL, LDLDIRECT in the last 72 hours. Thyroid Function Tests: No results for input(s): TSH, T4TOTAL, FREET4, T3FREE, THYROIDAB in the last 72 hours. Anemia Panel: Recent Labs    08/17/21 1510 08/18/21 0556  FERRITIN 211 209   Sepsis Labs: Recent Labs  Lab 08/17/21 1245 08/17/21 1410 08/17/21 1510  PROCALCITON  --   --  <0.10  LATICACIDVEN 2.9* 1.8  --     Recent Results (from the past 240 hour(s))  Resp Panel by RT-PCR (Flu A&B, Covid) Nasopharyngeal Swab     Status: Abnormal   Collection Time: 08/17/21 12:29 PM   Specimen: Nasopharyngeal Swab; Nasopharyngeal(NP) swabs in vial transport medium  Result Value Ref Range Status   SARS Coronavirus 2 by RT PCR POSITIVE (A) NEGATIVE Final    Comment: (NOTE) SARS-CoV-2 target  nucleic acids are DETECTED.  The SARS-CoV-2 RNA is generally detectable in upper respiratory specimens during the acute phase of infection. Positive results are indicative of the presence of the identified virus, but do not rule out bacterial infection or co-infection with other pathogens not detected by the test. Clinical correlation with patient history and other diagnostic information is necessary to determine patient infection status. The expected result is Negative.  Fact Sheet for Patients: EntrepreneurPulse.com.au  Fact Sheet for Healthcare Providers: IncredibleEmployment.be  This test is not yet approved or cleared by the [place] FDA and  has been authorized for detection and/or diagnosis of SARS-CoV-2 by FDA under an Emergency Use Authorization (EUA).  This EUA will remain in effect (meaning this test can be used) for the duration of  the COVID-19 declaration under Section 564(b)(1) of the A ct, 21 U.S.C. section 360bbb-3(b)(1), unless the authorization is terminated or revoked sooner.     Influenza A by PCR NEGATIVE NEGATIVE Final   Influenza B by PCR NEGATIVE NEGATIVE Final    Comment: (NOTE) The Xpert Xpress SARS-CoV-2/FLU/RSV plus assay is intended as an aid in the diagnosis of influenza from Nasopharyngeal swab specimens and should not be used as a sole basis for treatment. Nasal washings and aspirates are unacceptable for Xpert Xpress SARS-CoV-2/FLU/RSV testing.  Fact Sheet for Patients: EntrepreneurPulse.com.au  Fact Sheet for Healthcare Providers: IncredibleEmployment.be  This test is not yet approved or cleared by the [place] FDA and has been authorized for detection and/or diagnosis of SARS-CoV-2 by FDA under an Emergency Use Authorization (EUA). This EUA will remain in effect (meaning this test can be used) for the duration of the COVID-19 declaration under Section  564(b)(1) of the Act, 21 U.S.C. section 360bbb-3(b)(1), unless the authorization is terminated or revoked.  Performed at [facility], [place] [address], [place], [place] [address]  Radiology Studies: CT Abdomen Pelvis Wo Contrast  Result Date: 08/17/2021 CLINICAL DATA:  Nonlocalized acute abdomen pain. EXAM: CT ABDOMEN AND PELVIS WITHOUT CONTRAST TECHNIQUE: Multidetector CT imaging of the abdomen and pelvis was performed following the standard protocol without IV contrast. RADIATION DOSE REDUCTION: This exam was performed according to the departmental dose-optimization program which includes automated exposure control, adjustment of the mA and/or kV according to patient size and/or use of iterative reconstruction technique. COMPARISON:  Nov 30, 2020 FINDINGS: Lower chest: Mild dependent atelectasis of bilateral lung bases are identified. The heart size is enlarged. There is no pericardial effusion. Hepatobiliary: No focal liver abnormality is seen. No gallstones, gallbladder wall thickening, or biliary dilatation. Pancreas: Unremarkable. No pancreatic ductal dilatation or surrounding inflammatory changes. Spleen: Normal in size without focal abnormality. Adrenals/Urinary Tract: The bilateral adrenal glands are normal. There is no right hydronephrosis. Mild dilatation of the left ureter to the level of the left adnexa is less prominent compared to prior CT. The bladder is decompressed limiting evaluation. Stomach/Bowel: Stomach is within normal limits. The appendix is not seen, no inflammation is noted around cecum. No evidence of bowel wall thickening, distention, or inflammatory changes. Vascular/Lymphatic: Aortic atherosclerosis. No enlarged abdominal or pelvic lymph nodes. Reproductive: Left adnexa complex cystic ovarian mass measuring 10.9 x 5.8 x 11.2 cm enlarged compared to prior CT. Uterine fibroids are identified. Other: Small umbilical herniation of mesenteric fat is identified.  Musculoskeletal: Degenerative joint changes of the spine are noted. IMPRESSION: 1. Left adnexa complex cystic ovarian mass measuring 10.9 x 5.8 x 11.2 cm enlarged compared to prior CT. This is suspicious for neoplasm. Gynecologic follow-up is recommended. 2. Mild dilatation of the left ureter to the level of the left adnexa is less prominent compared to prior CT. 3. Uterine fibroids. Aortic Atherosclerosis (ICD10-I70.0). Electronically Signed   By: Abelardo Diesel M.D.   On: 08/17/2021 13:51   CT Head Wo Contrast  Result Date: 08/17/2021 CLINICAL DATA:  Mental status change EXAM: CT HEAD WITHOUT CONTRAST TECHNIQUE: Contiguous axial images were obtained from the base of the skull through the vertex without intravenous contrast. RADIATION DOSE REDUCTION: This exam was performed according to the departmental dose-optimization program which includes automated exposure control, adjustment of the mA and/or kV according to patient size and/or use of iterative reconstruction technique. COMPARISON:  07/10/2021 FINDINGS: Brain: No evidence of acute infarction, hemorrhage, hydrocephalus, extra-axial collection or mass lesion/mass effect. Vascular: No hyperdense vessel or unexpected calcification. Skull: Normal. Negative for fracture or focal lesion. Sinuses/Orbits: Bubbly opacities are identified within bilateral maxillary and sphenoid sinuses with air-fluid levels. Partial opacification of the anterior ethmoid air cells. Mastoid air cells are clear. Diffuse high attenuation throughout the right globe is again noted and appears unchanged compatible with previous enucleation. Left globe appears normal. Other: None IMPRESSION: 1. No acute intracranial abnormalities. 2. Bilateral maxillary and sphenoid sinus inflammation with air-fluid levels. Electronically Signed   By: Kerby Moors M.D.   On: 08/17/2021 13:41   DG CHEST PORT 1 VIEW  Result Date: 08/17/2021 CLINICAL DATA:  AMS, fatigue, COVID positive. EXAM: PORTABLE  CHEST 1 VIEW COMPARISON:  CT abdomen pelvis dated August 17, 2021 and chest radiograph dated July 10, 2021 FINDINGS: The heart is enlarged. Low lung volumes with bibasilar atelectasis. No large pleural effusion or pneumothorax. IMPRESSION: 1.  Stable cardiomegaly. 2. Low lung volumes with bibasilar atelectasis. No large pleural effusion or pneumothorax. Electronically Signed   By: Keane Police D.O.   On: 08/17/2021 14:42  ECHOCARDIOGRAM COMPLETE  Result Date: 08/18/2021    ECHOCARDIOGRAM REPORT   Patient Name:   [name] Date of Exam: 08/18/2021 Medical Rec #:  [id number]        Height:       63.0 in Accession #:    [id number]       Weight:       260.1 lb Date of Birth:  06/06/1956        BSA:          2.162 m Patient Age:    50 years         BP:           97/46 mmHg Patient Gender: F                HR:           58 bpm. Exam Location:  Inpatient Procedure: 2D Echo Indications:    Elevated troponin  History:        Patient has prior history of Echocardiogram examinations, most                 recent 04/13/2021. CAD, Signs/Symptoms:Shortness of Breath; Risk                 Factors:Dyslipidemia and Hypertension.  Sonographer:    Arlyss Gandy Referring Phys: [id number] Lequita Halt  Sonographer Comments: Technically challenging study due to limited acoustic windows and Technically difficult study due to poor echo windows. Image acquisition challenging due to patient body habitus and Supine. IMPRESSIONS  1. Left ventricular ejection fraction, by estimation, is 60 to 65%. The left ventricle has normal function. The left ventricle has no regional wall motion abnormalities. Left ventricular diastolic function could not be evaluated.  2. Right ventricular systolic function is normal. The right ventricular size is normal. There is normal pulmonary artery systolic pressure.  3. A small pericardial effusion is present. The pericardial effusion is circumferential. There is no evidence of cardiac tamponade.  4. The  mitral valve is grossly normal. No evidence of mitral valve regurgitation. No evidence of mitral stenosis.  5. The aortic valve is tricuspid. Aortic valve regurgitation is not visualized. No aortic stenosis is present.  6. The inferior vena cava is normal in size with greater than 50% respiratory variability, suggesting right atrial pressure of 3 mmHg. FINDINGS  Left Ventricle: Left ventricular ejection fraction, by estimation, is 60 to 65%. The left ventricle has normal function. The left ventricle has no regional wall motion abnormalities. The left ventricular internal cavity size was normal in size. There is  no left ventricular hypertrophy. Left ventricular diastolic function could not be evaluated due to nondiagnostic images. Left ventricular diastolic function could not be evaluated. Right Ventricle: The right ventricular size is normal. Right vetricular wall thickness was not well visualized. Right ventricular systolic function is normal. There is normal pulmonary artery systolic pressure. The tricuspid regurgitant velocity is 2.00 m/s, and with an assumed right atrial pressure of 3 mmHg, the estimated right ventricular systolic pressure is 95.0 mmHg. Left Atrium: Left atrial size was normal in size. Right Atrium: Right atrial size was normal in size. Pericardium: A small pericardial effusion is present. The pericardial effusion is circumferential. There is no evidence of cardiac tamponade. Mitral Valve: The mitral valve is grossly normal. No evidence of mitral valve regurgitation. No evidence of mitral valve stenosis. Tricuspid Valve: The tricuspid valve is grossly normal. Tricuspid valve regurgitation is trivial. No evidence of tricuspid stenosis. Aortic Valve:  The aortic valve is tricuspid. Aortic valve regurgitation is not visualized. No aortic stenosis is present. Pulmonic Valve: The pulmonic valve was grossly normal. Pulmonic valve regurgitation is trivial. No evidence of pulmonic stenosis. Aorta: The  aortic root and ascending aorta are structurally normal, with no evidence of dilitation. Venous: The inferior vena cava is normal in size with greater than 50% respiratory variability, suggesting right atrial pressure of 3 mmHg. IAS/Shunts: The atrial septum is grossly normal.  LEFT VENTRICLE PLAX 2D LVIDd:         2.90 cm   Diastology LVIDs:         2.00 cm   LV e' medial:    2.94 cm/s LV PW:         1.10 cm   LV E/e' medial:  21.1 LV IVS:        1.10 cm   LV e' lateral:   4.90 cm/s LVOT diam:     2.10 cm   LV E/e' lateral: 12.7 LVOT Area:     3.46 cm  LEFT ATRIUM         Index LA diam:    2.60 cm 1.20 cm/m   AORTA Ao Root diam: 2.80 cm Ao Asc diam:  3.20 cm MITRAL VALVE               TRICUSPID VALVE MV Area (PHT): 2.68 cm    TR Peak grad:   16.0 mmHg MV Decel Time: 283 msec    TR Vmax:        200.00 cm/s MV E velocity: 62.10 cm/s MV A velocity: 58.70 cm/s  SHUNTS MV E/A ratio:  1.06        Systemic Diam: 2.10 cm Eleonore Chiquito MD Electronically signed by Eleonore Chiquito MD Signature Date/Time: 08/18/2021/12:32:50 PM    Final      LOS: 0 days   Antonieta Pert, MD [facility]  08/18/2021, 1:17 PM

## 2021-08-18 NOTE — Plan of Care (Signed)
  Problem: Nutrition: Goal: Adequate nutrition will be maintained Outcome: Progressing   

## 2021-08-18 NOTE — ED Notes (Signed)
Placed Breakfast Order 

## 2021-08-18 NOTE — ED Notes (Signed)
Lunch Ordered °

## 2021-08-19 LAB — CBC
HCT: 36.2 % (ref 36.0–46.0)
Hemoglobin: 11.7 g/dL — ABNORMAL LOW (ref 12.0–15.0)
MCH: 30.2 pg (ref 26.0–34.0)
MCHC: 32.3 g/dL (ref 30.0–36.0)
MCV: 93.5 fL (ref 80.0–100.0)
Platelets: 190 10*3/uL (ref 150–400)
RBC: 3.87 MIL/uL (ref 3.87–5.11)
RDW: 14.7 % (ref 11.5–15.5)
WBC: 8.6 10*3/uL (ref 4.0–10.5)
nRBC: 0 % (ref 0.0–0.2)

## 2021-08-19 LAB — GLUCOSE, CAPILLARY
Glucose-Capillary: 182 mg/dL — ABNORMAL HIGH (ref 70–99)
Glucose-Capillary: 229 mg/dL — ABNORMAL HIGH (ref 70–99)
Glucose-Capillary: 249 mg/dL — ABNORMAL HIGH (ref 70–99)
Glucose-Capillary: 261 mg/dL — ABNORMAL HIGH (ref 70–99)

## 2021-08-19 LAB — PHOSPHORUS: Phosphorus: 2.7 mg/dL (ref 2.5–4.6)

## 2021-08-19 LAB — COMPREHENSIVE METABOLIC PANEL
ALT: 12 U/L (ref 0–44)
AST: 17 U/L (ref 15–41)
Albumin: 2.6 g/dL — ABNORMAL LOW (ref 3.5–5.0)
Alkaline Phosphatase: 51 U/L (ref 38–126)
Anion gap: 9 (ref 5–15)
BUN: 19 mg/dL (ref 8–23)
CO2: 19 mmol/L — ABNORMAL LOW (ref 22–32)
Calcium: 8.5 mg/dL — ABNORMAL LOW (ref 8.9–10.3)
Chloride: 107 mmol/L (ref 98–111)
Creatinine, Ser: 1.23 mg/dL — ABNORMAL HIGH (ref 0.44–1.00)
GFR, Estimated: 48 mL/min — ABNORMAL LOW (ref >60)
Glucose, Bld: 213 mg/dL — ABNORMAL HIGH (ref 70–99)
Potassium: 3.9 mmol/L (ref 3.5–5.1)
Sodium: 135 mmol/L (ref 135–145)
Total Bilirubin: 0.7 mg/dL (ref 0.3–1.2)
Total Protein: 6.5 g/dL (ref 6.5–8.1)

## 2021-08-19 LAB — MAGNESIUM: Magnesium: 2 mg/dL (ref 1.7–2.4)

## 2021-08-19 MED ORDER — LIVING WELL WITH DIABETES BOOK
Freq: Once | Status: AC
  Filled 2021-08-19: qty 1

## 2021-08-19 MED ORDER — LACTATED RINGERS IV SOLN: INTRAVENOUS | Status: DC

## 2021-08-19 NOTE — Progress Notes (Signed)
PROGRESS NOTE    [name]  POE:[id number] DOB: 1956/01/22 DOA: 08/17/2021 PCP: Ladell Pier, MD   Chief Complaint  Patient presents with   Fatigue   Altered Mental Status  Brief Narrative/Hospital Course: [name], 66 y.o. 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  ED Course: Borderline bradycardia, no hypotension.  Chest x-ray no clear infiltrates, no hypoxia. COVID positive.Blood work potassium 2.6, troponin 57> 49 CT abdomen showed chronic left adnexa mass enlarged in size compared to previous study.  Head CT negative for acute findings.   Subjective: Seen and examined complains of ongoing loose stool.  Complains of being very weak but overall much better compared to yesterday.  Not on oxygen and no shortness of breath.  Assessment & Plan:  COVID-19 virus infection:Without hypoxia.Continue remdesivir, no indication for steroid.  Continue airborne precautions.  Inflammatory markers relatively stable as below Recent Labs    08/17/21 1510 08/18/21 0556  DDIMER 1.40* 1.22*  FERRITIN 211 209  LDH 196*  --   CRP  --  1.1*   Persistent diarrhea likely from above supportive care monitoring.  We will check GI panel add Imodium. Hypokalemia due to diarrhea,replaced aggressively and improved.Monitor. Dehydration continue on ivf.   Elevated troponin:Mildly elevated and flat likely demand ischemia in the setting of COVID infection no chest pain no abnormal changes on the EKG.  TTE-LVEF stable 60-65% with no RWMA, small pericardial effusion without tamponade, grossly normal mitral and aortic valve.  No further work-up indicated.  PSVT/HTN: Blood pressure overall stable continue on Imdur, current short acting Cardizem.   AKI CKD stage IIIb:overall  creatinine improved to 1.2 continue gentle IV fluids given ongoing diarrhea.   Recent Labs  Lab 08/17/21 1245 08/18/21 0556 08/19/21 0723  BUN 20 21 19   CREATININE 1.90* 1.52* 1.23*    Class III Obesity:Patient's Body mass index is 46.08 kg/m. Will benefit with PCP follow-up,weight loss  healthy lifestyle and outpatient sleep evaluation.  Physical deconditioning severe with weakness debility in the setting of COVID-19 vaccine obtain PT OT evaluation.  DVT prophylaxis: heparin injection 5,000 Units Start: 08/17/21 1700 Code Status:   Code Status: Full Code Family Communication:Plan of care discussed with patient at bedside. Status is: inpatient. Remains hospitalized for ongoing management of diarrhea persistent electrolyte imbalance severe deconditioning debility Disposition:Currently NOT medically stable for discharge. Anticipated Disposition: TBD Obtaining PT OT eval.  Total time spent in the care of this patient 50 MINUTES Objective: Vitals last 24 hrs: Vitals:   08/18/21 1200 08/18/21 1641 08/19/21 0705 08/19/21 0918  BP: (!) 116/58 134/67 (!) 144/66 (!) 132/55  Pulse: 62 65 63 (!) 57  Resp: 17 17 18 18   Temp:   98 F (36.7 C) 98.3 F (36.8 C)  TempSrc:   Oral Oral  SpO2: 97% 99% 100% 100%  Weight:      Height:       Weight change:   Intake/Output Summary (Last 24 hours) at 08/19/2021 1110 Last data filed at 08/19/2021 0600 Gross per 24 hour  Intake 440 ml  Output 60 ml  Net 380 ml   Net IO Since Admission: 380 mL [08/19/21 1110]   Physical Examination: General exam: AAOx 3, OBESE, older than stated age, weak appearing. HEENT:Oral mucosa moist, Ear/Nose WNL grossly, dentition normal. Respiratory system:  bilaterally diminished, no use of accessory muscle Cardiovascular system: S1 & S2 +, No JVD,. Gastrointestinal system: Abdomen soft, NT,ND, BS+ Nervous System:Alert, awake, moving extremities and grossly nonfocal Extremities: no edema, distal peripheral pulses  palpable.  Skin: No rashes,no icterus. MSK: Normal muscle bulk,tone, power   Medications reviewed:  Scheduled Meds:  aspirin EC  81 mg Oral Daily   atorvastatin  80 mg Oral Daily   brimonidine  1 drop Both Eyes BID   diltiazem  60 mg Oral Q6H   heparin  5,000 Units Subcutaneous Q12H   insulin aspart  0-9 Units Subcutaneous TID WC   isosorbide mononitrate  120 mg Oral Daily   living well with diabetes book   Does not apply Once   timolol  1 drop Both Eyes BID   Continuous Infusions:  remdesivir 100 mg in NS 100 mL Stopped (08/18/21 1318)   Diet Order             Diet renal/carb modified with fluid restriction Diet-HS Snack? Nothing; Fluid restriction: 1200 mL Fluid; Room service appropriate? Yes; Fluid consistency: Thin  Diet effective now                 Weight change:   Wt Readings from Last 3 Encounters:  08/17/21 118 kg  07/10/21 117.9 kg  04/15/21 117 kg     Consultants:see note  Procedures:see note Antimicrobials: Anti-infectives (From admission, onward)    Start     Dose/Rate Route Frequency Ordered Stop   08/18/21 1000  remdesivir 100 mg in sodium chloride 0.9 % 100 mL IVPB       See Hyperspace for full Linked Orders Report.   100 mg 200 mL/hr over 30 Minutes Intravenous Daily 08/17/21 1606 08/22/21 0959   08/17/21 1900  remdesivir 200 mg in sodium chloride 0.9% 250 mL IVPB       See Hyperspace for full Linked Orders Report.   200 mg 580 mL/hr over 30 Minutes Intravenous Once 08/17/21 1606 08/17/21 2104      Culture/Microbiology Other culture-see note Unresulted Labs (From admission, onward)     Start     Ordered   08/19/21 0500  CBC  Daily,   R     Question:  Specimen collection method  Answer:  Lab=Lab collect   08/18/21 1319   08/18/21 0500  Comprehensive metabolic panel  Daily,   R      08/17/21 1537   08/18/21 0500  Phosphorus  Daily,   R      08/17/21 1537           Data Reviewed: I have personally reviewed following labs and imaging  studies CBC: Recent Labs  Lab 08/17/21 1245 08/18/21 0556 08/19/21 0723  WBC 10.9* 8.2 8.6  NEUTROABS 7.7 4.9  --   HGB 14.3 12.3 11.7*  HCT 44.2 37.3 36.2  MCV 93.1 93.5 93.5  PLT 236 189 347    Basic Metabolic Panel: Recent Labs  Lab 08/17/21 1245 08/17/21 1510 08/18/21 0556 08/18/21 1733 08/19/21 0723  NA 137  --  138  --  135  K 2.6*  --  2.8* 4.7 3.9  CL 100  --  105  --  107  CO2 23  --  22  --  19*  GLUCOSE 85  --  122*  --  213*  BUN 20  --  21  --  19  CREATININE 1.90*  --  1.52*  --  1.23*  CALCIUM 9.3  --  8.3*  --  8.5*  MG 2.2  --  2.0  --  2.0  PHOS  --  3.9 3.8  --  2.7    GFR: Estimated Creatinine Clearance: 55.8 mL/min (A) (by C-G formula based on SCr of 1.23 mg/dL (H)). Liver Function Tests: Recent Labs  Lab 08/17/21 1245 08/18/21 0556 08/19/21 0723  AST 28 16 17   ALT 16 12 12   ALKPHOS 67 55 51  BILITOT 0.8 0.5 0.7  PROT 8.0 6.5 6.5  ALBUMIN 3.0* 2.5* 2.6*    Recent Labs  Lab 08/17/21 1245  LIPASE 37    Recent Labs  Lab 08/17/21 1724  AMMONIA 55*    Coagulation Profile: No results for input(s): INR, PROTIME in the last 168 hours. Cardiac Enzymes: No results for input(s): CKTOTAL, CKMB, CKMBINDEX, TROPONINI in the last 168 hours. BNP (last 3 results) No results for input(s): PROBNP in the last 8760 hours. HbA1C: No results for input(s): HGBA1C in the last 72 hours. CBG: Recent Labs  Lab 08/18/21 0811 08/18/21 1311 08/18/21 1637 08/18/21 2334 08/19/21 0703  GLUCAP 156* 221* 218* 212* 182*    Lipid Profile: No results for input(s): CHOL, HDL, LDLCALC, TRIG, CHOLHDL, LDLDIRECT in the last 72 hours. Thyroid Function Tests: No results for input(s): TSH, T4TOTAL, FREET4, T3FREE, THYROIDAB in the last 72 hours. Anemia Panel: Recent Labs    08/17/21 1510 08/18/21 0556  FERRITIN 211 209    Sepsis Labs: Recent Labs  Lab 08/17/21 1245 08/17/21 1410 08/17/21 1510  PROCALCITON  --   --  <0.10  LATICACIDVEN 2.9*  1.8  --      Recent Results (from the past 240 hour(s))  Resp Panel by RT-PCR (Flu A&B, Covid) Nasopharyngeal Swab     Status: Abnormal   Collection Time: 08/17/21 12:29 PM   Specimen: Nasopharyngeal Swab; Nasopharyngeal(NP) swabs in vial transport medium  Result Value Ref Range Status   SARS Coronavirus 2 by RT PCR POSITIVE (A) NEGATIVE Final    Comment: (NOTE) SARS-CoV-2 target nucleic acids are DETECTED.  The SARS-CoV-2 RNA is generally detectable in upper respiratory specimens during the acute phase of infection. Positive results are indicative of the presence of the identified virus, but do not rule out bacterial infection or co-infection with other pathogens not detected by the test. Clinical correlation with patient history and other diagnostic information is necessary to determine patient infection status. The expected result is Negative.  Fact Sheet for Patients: EntrepreneurPulse.com.au  Fact Sheet for Healthcare Providers: IncredibleEmployment.be  This test is not yet approved or cleared by the [place] FDA and  has been authorized for detection and/or diagnosis of SARS-CoV-2 by FDA under an Emergency Use Authorization (EUA).  This EUA will remain in effect (meaning this test can be used) for the duration of  the COVID-19 declaration under Section 564(b)(1) of the A ct, 21 U.S.C. section 360bbb-3(b)(1), unless the authorization is terminated or revoked sooner.     Influenza A by PCR NEGATIVE NEGATIVE Final   Influenza B by PCR NEGATIVE NEGATIVE Final    Comment: (NOTE) The Xpert Xpress SARS-CoV-2/FLU/RSV plus assay is intended as an aid in the diagnosis of influenza from Nasopharyngeal swab specimens and should not be used as a sole basis for treatment. Nasal washings and aspirates are unacceptable for Xpert Xpress SARS-CoV-2/FLU/RSV testing.  Fact Sheet for Patients: EntrepreneurPulse.com.au  Fact  Sheet for Healthcare Providers: IncredibleEmployment.be  This test is not yet approved or cleared by the [place] FDA and has been  authorized for detection and/or diagnosis of SARS-CoV-2 by FDA under an Emergency Use Authorization (EUA). This EUA will remain in effect (meaning this test can be used) for the duration of the COVID-19 declaration under Section 564(b)(1) of the Act, 21 U.S.C. section 360bbb-3(b)(1), unless the authorization is terminated or revoked.  Performed at [facility], [place] [address] [place], [place] [address]       Radiology Studies: CT Abdomen Pelvis Wo Contrast  Result Date: 08/17/2021 CLINICAL DATA:  Nonlocalized acute abdomen pain. EXAM: CT ABDOMEN AND PELVIS WITHOUT CONTRAST TECHNIQUE: Multidetector CT imaging of the abdomen and pelvis was performed following the standard protocol without IV contrast. RADIATION DOSE REDUCTION: This exam was performed according to the departmental dose-optimization program which includes automated exposure control, adjustment of the mA and/or kV according to patient size and/or use of iterative reconstruction technique. COMPARISON:  Nov 30, 2020 FINDINGS: Lower chest: Mild dependent atelectasis of bilateral lung bases are identified. The heart size is enlarged. There is no pericardial effusion. Hepatobiliary: No focal liver abnormality is seen. No gallstones, gallbladder wall thickening, or biliary dilatation. Pancreas: Unremarkable. No pancreatic ductal dilatation or surrounding inflammatory changes. Spleen: Normal in size without focal abnormality. Adrenals/Urinary Tract: The bilateral adrenal glands are normal. There is no right hydronephrosis. Mild dilatation of the left ureter to the level of the left adnexa is less prominent compared to prior CT. The bladder is decompressed limiting evaluation. Stomach/Bowel: Stomach is within normal limits. The appendix is not seen, no inflammation is noted around  cecum. No evidence of bowel wall thickening, distention, or inflammatory changes. Vascular/Lymphatic: Aortic atherosclerosis. No enlarged abdominal or pelvic lymph nodes. Reproductive: Left adnexa complex cystic ovarian mass measuring 10.9 x 5.8 x 11.2 cm enlarged compared to prior CT. Uterine fibroids are identified. Other: Small umbilical herniation of mesenteric fat is identified. Musculoskeletal: Degenerative joint changes of the spine are noted. IMPRESSION: 1. Left adnexa complex cystic ovarian mass measuring 10.9 x 5.8 x 11.2 cm enlarged compared to prior CT. This is suspicious for neoplasm. Gynecologic follow-up is recommended. 2. Mild dilatation of the left ureter to the level of the left adnexa is less prominent compared to prior CT. 3. Uterine fibroids. Aortic Atherosclerosis (ICD10-I70.0). Electronically Signed   By: Abelardo Diesel M.D.   On: 08/17/2021 13:51   CT Head Wo Contrast  Result Date: 08/17/2021 CLINICAL DATA:  Mental status change EXAM: CT HEAD WITHOUT CONTRAST TECHNIQUE: Contiguous axial images were obtained from the base of the skull through the vertex without intravenous contrast. RADIATION DOSE REDUCTION: This exam was performed according to the departmental dose-optimization program which includes automated exposure control, adjustment of the mA and/or kV according to patient size and/or use of iterative reconstruction technique. COMPARISON:  07/10/2021 FINDINGS: Brain: No evidence of acute infarction, hemorrhage, hydrocephalus, extra-axial collection or mass lesion/mass effect. Vascular: No hyperdense vessel or unexpected calcification. Skull: Normal. Negative for fracture or focal lesion. Sinuses/Orbits: Bubbly opacities are identified within bilateral maxillary and sphenoid sinuses with air-fluid levels. Partial opacification of the anterior ethmoid air cells. Mastoid air cells are clear. Diffuse high attenuation throughout the right globe is again noted and appears unchanged  compatible with previous enucleation. Left globe appears normal. Other: None IMPRESSION: 1. No acute intracranial abnormalities. 2. Bilateral maxillary and sphenoid sinus inflammation with air-fluid levels. Electronically Signed   By: Kerby Moors M.D.   On: 08/17/2021 13:41   DG CHEST PORT 1 VIEW  Result Date: 08/17/2021 CLINICAL DATA:  AMS, fatigue, COVID positive.  EXAM: PORTABLE CHEST 1 VIEW COMPARISON:  CT abdomen pelvis dated August 17, 2021 and chest radiograph dated July 10, 2021 FINDINGS: The heart is enlarged. Low lung volumes with bibasilar atelectasis. No large pleural effusion or pneumothorax. IMPRESSION: 1.  Stable cardiomegaly. 2. Low lung volumes with bibasilar atelectasis. No large pleural effusion or pneumothorax. Electronically Signed   By: Keane Police D.O.   On: 08/17/2021 14:42   ECHOCARDIOGRAM COMPLETE  Result Date: 08/18/2021    ECHOCARDIOGRAM REPORT   Patient Name:   [name] Date of Exam: 08/18/2021 Medical Rec #:  [id number]        Height:       63.0 in Accession #:    [id number]       Weight:       260.1 lb Date of Birth:  [birth year]        BSA:          2.162 m Patient Age:    12 years         BP:           97/46 mmHg Patient Gender: F                HR:           58 bpm. Exam Location:  Inpatient Procedure: 2D Echo Indications:    Elevated troponin  History:        Patient has prior history of Echocardiogram examinations, most                 recent 04/13/2021. CAD, Signs/Symptoms:Shortness of Breath; Risk                 Factors:Dyslipidemia and Hypertension.  Sonographer:    Arlyss Gandy Referring Phys: [id number] Lequita Halt  Sonographer Comments: Technically challenging study due to limited acoustic windows and Technically difficult study due to poor echo windows. Image acquisition challenging due to patient body habitus and Supine. IMPRESSIONS  1. Left ventricular ejection fraction, by estimation, is 60 to 65%. The left ventricle has normal function. The left  ventricle has no regional wall motion abnormalities. Left ventricular diastolic function could not be evaluated.  2. Right ventricular systolic function is normal. The right ventricular size is normal. There is normal pulmonary artery systolic pressure.  3. A small pericardial effusion is present. The pericardial effusion is circumferential. There is no evidence of cardiac tamponade.  4. The mitral valve is grossly normal. No evidence of mitral valve regurgitation. No evidence of mitral stenosis.  5. The aortic valve is tricuspid. Aortic valve regurgitation is not visualized. No aortic stenosis is present.  6. The inferior vena cava is normal in size with greater than 50% respiratory variability, suggesting right atrial pressure of 3 mmHg. FINDINGS  Left Ventricle: Left ventricular ejection fraction, by estimation, is 60 to 65%. The left ventricle has normal function. The left ventricle has no regional wall motion abnormalities. The left ventricular internal cavity size was normal in size. There is  no left ventricular hypertrophy. Left ventricular diastolic function could not be evaluated due to nondiagnostic images. Left ventricular diastolic function could not be evaluated. Right Ventricle: The right ventricular size is normal. Right vetricular wall thickness was not well visualized. Right ventricular systolic function is normal. There is normal pulmonary artery systolic pressure. The tricuspid regurgitant velocity is 2.00 m/s, and with an assumed right atrial pressure of 3 mmHg, the estimated right ventricular systolic pressure is 11.9 mmHg. Left Atrium: Left atrial size  was normal in size. Right Atrium: Right atrial size was normal in size. Pericardium: A small pericardial effusion is present. The pericardial effusion is circumferential. There is no evidence of cardiac tamponade. Mitral Valve: The mitral valve is grossly normal. No evidence of mitral valve regurgitation. No evidence of mitral valve stenosis.  Tricuspid Valve: The tricuspid valve is grossly normal. Tricuspid valve regurgitation is trivial. No evidence of tricuspid stenosis. Aortic Valve: The aortic valve is tricuspid. Aortic valve regurgitation is not visualized. No aortic stenosis is present. Pulmonic Valve: The pulmonic valve was grossly normal. Pulmonic valve regurgitation is trivial. No evidence of pulmonic stenosis. Aorta: The aortic root and ascending aorta are structurally normal, with no evidence of dilitation. Venous: The inferior vena cava is normal in size with greater than 50% respiratory variability, suggesting right atrial pressure of 3 mmHg. IAS/Shunts: The atrial septum is grossly normal.  LEFT VENTRICLE PLAX 2D LVIDd:         2.90 cm   Diastology LVIDs:         2.00 cm   LV e' medial:    2.94 cm/s LV PW:         1.10 cm   LV E/e' medial:  21.1 LV IVS:        1.10 cm   LV e' lateral:   4.90 cm/s LVOT diam:     2.10 cm   LV E/e' lateral: 12.7 LVOT Area:     3.46 cm  LEFT ATRIUM         Index LA diam:    2.60 cm 1.20 cm/m   AORTA Ao Root diam: 2.80 cm Ao Asc diam:  3.20 cm MITRAL VALVE               TRICUSPID VALVE MV Area (PHT): 2.68 cm    TR Peak grad:   16.0 mmHg MV Decel Time: 283 msec    TR Vmax:        200.00 cm/s MV E velocity: 62.10 cm/s MV A velocity: 58.70 cm/s  SHUNTS MV E/A ratio:  1.06        Systemic Diam: 2.10 cm Eleonore Chiquito MD Electronically signed by Eleonore Chiquito MD Signature Date/Time: 08/18/2021/12:32:50 PM    Final      LOS: 1 day   Antonieta Pert, MD [facility]  08/19/2021, 11:10 AM

## 2021-08-19 NOTE — Evaluation (Addendum)
Physical Therapy Evaluation Patient Details Name: [name] MRN: [id number] DOB: September 29, 1955 Today's Date: 08/19/2021  History of Present Illness  Patient is a 66 yo female presenting to the ED on 08/17/2021 from home with AMS, persistent diarrhea, generalized weakness and new cough. Patient recently discharged from SNF at [facility]. Patient found to be Covid + on 08/17/20. Head CT negative. Medical history significant of IDDM, HTN, CAD, CKD stage IIIb, diabetic neuropathy, PSVT on Cardizem and metoprolol,  and left adnexa mass.  Clinical Impression  Pt presents to PT with deficits in functional mobility, gait, balance, endurance, power, cognition. Pt is limited by reports of dizziness during session, stating that it happens anytime she sits up and never goes away (pt is a poor historian and demonstrates impaired cognition during session). Pt currently requires assistance to mobilize at the edge of bed, declining mobility away from the bed due to reports of dizziness. Pt will benefit from continued acute PT services to improve mobility quality and reduce falls risk. It appears the pt must be able to ambulate to return home as prior chart reviews indicate the home is not wheelchair accessible. PT thus recommends SNF placement to aide in improving ambulation tolerance and balance.  If pt elects to discharge home she will benefit from [place] and assistance from spouse for all mobility.     Recommendations for follow up therapy are one component of a multi-disciplinary discharge planning process, led by the attending physician.  Recommendations may be updated based on patient status, additional functional criteria and insurance authorization.  Follow Up Recommendations Skilled nursing-short term rehab (<3 hours/day)    Assistance Recommended at Discharge Intermittent Supervision/Assistance  Patient can return home with the following  A little help with walking and/or transfers;A little  help with bathing/dressing/bathroom;Assistance with cooking/housework;Direct supervision/assist for medications management;Direct supervision/assist for financial management;Help with stairs or ramp for entrance;Assist for transportation    Equipment Recommendations  (TBD)  Recommendations for Other Services       Functional Status Assessment Patient has had a recent decline in their functional status and demonstrates the ability to make significant improvements in function in a reasonable and predictable amount of time.     Precautions / Restrictions Precautions Precautions: Fall Precaution Comments: frequent falls Restrictions Weight Bearing Restrictions: No      Mobility  Bed Mobility Overal bed mobility: Needs Assistance Bed Mobility: Supine to Sit, Sit to Supine     Supine to sit: Min assist, HOB elevated Sit to supine: Min assist, HOB elevated        Transfers Overall transfer level: Needs assistance Equipment used: Rolling walker (2 wheels) Transfers: Sit to/from Stand Sit to Stand: Min guard, From elevated surface           General transfer comment: pt stands twice    Ambulation/Gait Ambulation/Gait assistance: Min guard Gait Distance (Feet): 3 Feet Assistive device: Rolling walker (2 wheels) Gait Pattern/deviations: Step-to pattern Gait velocity: reduced Gait velocity interpretation: <1.31 ft/sec, indicative of household ambulator   General Gait Details: pt takes multiple sides steps toward head of bed, reduced step length and foot clearance  Stairs            Wheelchair Mobility    Modified Rankin (Stroke Patients Only)       Balance Overall balance assessment: Needs assistance Sitting-balance support: No upper extremity supported, Feet supported Sitting balance-Leahy Scale: Fair     Standing balance support: Bilateral upper extremity supported, Reliant on assistive device for balance  Standing balance-Leahy Scale: Poor                                Pertinent Vitals/Pain Pain Assessment Pain Assessment: 0-10 Pain Score: 5  Pain Location: stomach Pain Descriptors / Indicators: Discomfort Pain Intervention(s): Limited activity within patient's tolerance    Home Living Family/patient expects to be discharged to:: Private residence Living Arrangements: Spouse/significant other Available Help at Discharge: Family;Available 24 hours/day Type of Home: Apartment Home Access: Stairs to enter Entrance Stairs-Rails: None Entrance Stairs-Number of Steps: 3   Home Layout: One level Home Equipment: Conservation officer, nature (2 wheels);Wheelchair - manual;Cane - single point;Shower seat Additional Comments: doors in house too narrow for w/c and walker    Prior Function Prior Level of Function : Needs assist;Patient poor historian/Family not available       Physical Assist : ADLs (physical);Mobility (physical)     Mobility Comments: Pt reports she transfers to wheelchair with assistance, later reports walking with use of walker. Per chart review from previous admission, husband assists with bed mobility and standing as needed. Pt will use walker for mobility in the home with furniture walking in/out of bathroom. Frequent falls and unable to get up without multiple people to assist ADLs Comments: Pt reports husband assists with LB bathing and dressing. Per chart, pt uses depends at home. Husband does IADls and drives     Hand Dominance   Dominant Hand: Right    Extremity/Trunk Assessment   Upper Extremity Assessment Upper Extremity Assessment: Generalized weakness    Lower Extremity Assessment Lower Extremity Assessment: Generalized weakness    Cervical / Trunk Assessment Cervical / Trunk Assessment: Normal  Communication   Communication: No difficulties  Cognition Arousal/Alertness: Awake/alert Behavior During Therapy: WFL for tasks assessed/performed Overall Cognitive Status: Impaired/Different from  baseline Area of Impairment: Orientation, Memory, Problem solving                 Orientation Level: Disoriented to, Time   Memory: Decreased short-term memory       Problem Solving: Slow processing          General Comments General comments (skin integrity, edema, etc.): VSS on RA    Exercises     Assessment/Plan    PT Assessment Patient needs continued PT services  PT Problem List Decreased strength;Decreased activity tolerance;Decreased balance;Decreased mobility;Decreased cognition       PT Treatment Interventions DME instruction;Gait training;Functional mobility training;Therapeutic activities;Therapeutic exercise;Balance training;Neuromuscular re-education;Patient/family education;Wheelchair mobility training;Cognitive remediation    PT Goals (Current goals can be found in the Care Plan section)  Acute Rehab PT Goals Patient Stated Goal: to return to rehab to reduce falls risk PT Goal Formulation: With patient Time For Goal Achievement: 09/02/21 Potential to Achieve Goals: Fair    Frequency Min 2X/week     Co-evaluation               AM-PAC PT "6 Clicks" Mobility  Outcome Measure Help needed turning from your back to your side while in a flat bed without using bedrails?: A Little Help needed moving from lying on your back to sitting on the side of a flat bed without using bedrails?: A Little Help needed moving to and from a bed to a chair (including a wheelchair)?: A Little Help needed standing up from a chair using your arms (e.g., wheelchair or bedside chair)?: A Little Help needed to walk in hospital room?: Total Help needed climbing 3-5  steps with a railing? : Total 6 Click Score: 14    End of Session   Activity Tolerance: Patient tolerated treatment well Patient left: in bed;with call bell/phone within reach;with bed alarm set Nurse Communication: Mobility status PT Visit Diagnosis: Other abnormalities of gait and mobility  (R26.89);History of falling (Z91.81)    Time: 3300-7622 PT Time Calculation (min) (ACUTE ONLY): 36 min   Charges:   PT Evaluation $PT Eval Low Complexity: [place], PT, DPT Acute Rehabilitation Pager: [phone] Office [phone]   Zenaida Niece 08/19/2021, 11:00 AM

## 2021-08-19 NOTE — Progress Notes (Signed)
OT cancellation    08/19/21 1100  OT Visit Information  Last OT Received On 08/19/21  Reason Eval/Treat Not Completed Patient at procedure or test/ unavailable (Being seen by PT. Will follow up later time.)   Maurie Boettcher, OT/L   Acute OT Clinical Specialist [place] Pager [phone] Office [phone]

## 2021-08-20 LAB — COMPREHENSIVE METABOLIC PANEL
ALT: 10 U/L (ref 0–44)
AST: 15 U/L (ref 15–41)
Albumin: 2.2 g/dL — ABNORMAL LOW (ref 3.5–5.0)
Alkaline Phosphatase: 47 U/L (ref 38–126)
Anion gap: 7 (ref 5–15)
BUN: 16 mg/dL (ref 8–23)
CO2: 19 mmol/L — ABNORMAL LOW (ref 22–32)
Calcium: 8.4 mg/dL — ABNORMAL LOW (ref 8.9–10.3)
Chloride: 109 mmol/L (ref 98–111)
Creatinine, Ser: 1.26 mg/dL — ABNORMAL HIGH (ref 0.44–1.00)
GFR, Estimated: 47 mL/min — ABNORMAL LOW (ref >60)
Glucose, Bld: 267 mg/dL — ABNORMAL HIGH (ref 70–99)
Potassium: 3.4 mmol/L — ABNORMAL LOW (ref 3.5–5.1)
Sodium: 135 mmol/L (ref 135–145)
Total Bilirubin: 0.4 mg/dL (ref 0.3–1.2)
Total Protein: 5.8 g/dL — ABNORMAL LOW (ref 6.5–8.1)

## 2021-08-20 LAB — CBC
HCT: 32.9 % — ABNORMAL LOW (ref 36.0–46.0)
Hemoglobin: 10.6 g/dL — ABNORMAL LOW (ref 12.0–15.0)
MCH: 29.9 pg (ref 26.0–34.0)
MCHC: 32.2 g/dL (ref 30.0–36.0)
MCV: 92.7 fL (ref 80.0–100.0)
Platelets: 163 10*3/uL (ref 150–400)
RBC: 3.55 MIL/uL — ABNORMAL LOW (ref 3.87–5.11)
RDW: 14.5 % (ref 11.5–15.5)
WBC: 7.1 10*3/uL (ref 4.0–10.5)
nRBC: 0 % (ref 0.0–0.2)

## 2021-08-20 LAB — GASTROINTESTINAL PANEL BY PCR, STOOL (REPLACES STOOL CULTURE)

## 2021-08-20 LAB — GLUCOSE, CAPILLARY
Glucose-Capillary: 230 mg/dL — ABNORMAL HIGH (ref 70–99)
Glucose-Capillary: 355 mg/dL — ABNORMAL HIGH (ref 70–99)
Glucose-Capillary: 252 mg/dL — ABNORMAL HIGH (ref 70–99)
Glucose-Capillary: 202 mg/dL — ABNORMAL HIGH (ref 70–99)

## 2021-08-20 LAB — PHOSPHORUS: Phosphorus: 2.2 mg/dL — ABNORMAL LOW (ref 2.5–4.6)

## 2021-08-20 MED ORDER — K PHOS MONO-SOD PHOS DI & MONO 155-852-130 MG PO TABS
250.0000 mg | ORAL_TABLET | Freq: Three times a day (TID) | ORAL | Status: AC
  Administered 2021-08-20 (×3): 250 mg via ORAL
  Filled 2021-08-20 (×3): qty 1

## 2021-08-20 MED ORDER — INSULIN GLARGINE-YFGN 100 UNIT/ML ~~LOC~~ SOLN
10.0000 [IU] | Freq: Every day | SUBCUTANEOUS | Status: DC
  Administered 2021-08-20 – 2021-08-21 (×2): 10 [IU] via SUBCUTANEOUS
  Filled 2021-08-20 (×3): qty 0.1

## 2021-08-20 MED ORDER — POTASSIUM CHLORIDE CRYS ER 20 MEQ PO TBCR
20.0000 meq | EXTENDED_RELEASE_TABLET | Freq: Once | ORAL | Status: AC
  Administered 2021-08-20: 20 meq via ORAL
  Filled 2021-08-20: qty 1

## 2021-08-20 NOTE — Evaluation (Signed)
Occupational Therapy Evaluation Patient Details Name: [name] MRN: [id number] DOB: Sep 04, 1955 Today's Date: 08/20/2021   History of Present Illness Patient is a 66 yo female presenting to the ED on 08/17/2021 from home with AMS, persistent diarrhea, generalized weakness and new cough. Patient recently discharged from SNF at [facility]. Patient found to be Covid + on 08/17/20. Head CT negative. Medical history significant of IDDM, HTN, CAD, CKD stage IIIb, diabetic neuropathy, PSVT on Cardizem and metoprolol,  and left adnexa mass.   Clinical Impression   PTA, pt lives with spouse and reports mobility using RW vs wheelchair though not all doorways DME accessible at home. Pt reports able to complete UB ADLs but receives assist with LB bathing, dressing, toileting and shower transfers. Pt presents now fairly close to reported baseline though requiring increased assist with transfers/mobility with consistent sequencing cues needed. Pt does endorse a recent hx of falls, so would recommend SNF rehab only if family not comfortable providing moderate assist for transfers/mobility at discharge, as pt close to baseline for ADLs. If opting for home, recommend BSC use initially to decrease fall risk with bathroom mobility attempts without AD.      Recommendations for follow up therapy are one component of a multi-disciplinary discharge planning process, led by the attending physician.  Recommendations may be updated based on patient status, additional functional criteria and insurance authorization.   Follow Up Recommendations  Skilled nursing-short term rehab (<3 hours/day) (vs HHOT if family able to provide moderate physical assist)    Assistance Recommended at Discharge Frequent or constant Supervision/Assistance  Patient can return home with the following A lot of help with walking and/or transfers;A lot of help with bathing/dressing/bathroom;Assistance with cooking/housework;Help with stairs or  ramp for entrance    Functional Status Assessment  Patient has had a recent decline in their functional status and demonstrates the ability to make significant improvements in function in a reasonable and predictable amount of time.  Equipment Recommendations  None recommended by OT    Recommendations for Other Services       Precautions / Restrictions Precautions Precautions: Fall Precaution Comments: frequent falls Restrictions Weight Bearing Restrictions: No      Mobility Bed Mobility Overal bed mobility: Needs Assistance Bed Mobility: Supine to Sit     Supine to sit: Min assist, HOB elevated     General bed mobility comments: light MIn A to steady trunk upon sitting    Transfers Overall transfer level: Needs assistance Equipment used: Rolling walker (2 wheels) Transfers: Sit to/from Stand, Bed to chair/wheelchair/BSC Sit to Stand: Mod assist     Step pivot transfers: Mod assist     General transfer comment: initially Mod A from bedside, progressing to MIn A from recliner pushing from armrests. pivot to recliner, setup with pt then reporting need for bathroom assist, pivot to [facility]      Balance Overall balance assessment: Needs assistance, History of Falls Sitting-balance support: No upper extremity supported, Feet supported Sitting balance-Leahy Scale: Fair     Standing balance support: Bilateral upper extremity supported, Reliant on assistive device for balance Standing balance-Leahy Scale: Poor                             ADL either performed or assessed with clinical judgement   ADL Overall ADL's : Needs assistance/impaired Eating/Feeding: Set up;Sitting Eating/Feeding Details (indicate cue type and reason): assist to prepare coffee Grooming: Set up;Sitting;Wash/dry face;Wash/dry hands  Upper Body Bathing: Set up;Sitting   Lower Body Bathing: Moderate assistance;Sit to/from stand   Upper Body Dressing : Set up;Sitting   Lower Body  Dressing: Moderate assistance;Sit to/from stand   Toilet Transfer: Moderate assistance;Stand-pivot;BSC/3in1;Rolling walker (2 wheels) Toilet Transfer Details (indicate cue type and reason): physical assist to move RW and cues for sequencing throughout Toileting- Clothing Manipulation and Hygiene: Sit to/from stand;Maximal assistance         General ADL Comments: Not far from baseline, limited by cognition with need for sequencing cues throughout movement     Vision Ability to See in Adequate Light: 0 Adequate Patient Visual Report: No change from baseline Vision Assessment?: No apparent visual deficits     Perception     Praxis      Pertinent Vitals/Pain Pain Assessment Pain Assessment: No/denies pain     Hand Dominance Right   Extremity/Trunk Assessment Upper Extremity Assessment Upper Extremity Assessment: Generalized weakness   Lower Extremity Assessment Lower Extremity Assessment: Defer to PT evaluation   Cervical / Trunk Assessment Cervical / Trunk Assessment: Normal   Communication Communication Communication: No difficulties   Cognition Arousal/Alertness: Awake/alert Behavior During Therapy: WFL for tasks assessed/performed Overall Cognitive Status: Impaired/Different from baseline Area of Impairment: Memory, Problem solving, Safety/judgement, Following commands                     Memory: Decreased short-term memory Following Commands: Follows one step commands consistently, Follows one step commands with increased time Safety/Judgement: Decreased awareness of safety, Decreased awareness of deficits   Problem Solving: Slow processing, Requires tactile cues, Requires verbal cues, Difficulty sequencing General Comments: consistent cues needed for sequencing hand placement, DME, etc but pt able to answer PLOF/orientation questions correctly     General Comments  reports dizziness though no nystagmus noted. intermittently throughout session, able to  redirect. Asked if bathroom assist needed initially with mobility with pt declining. After getting set up in recliner, pt reported need for BM. On return back to chair from [facility] after extended time, pt reported urine incontinence and required assist to change padding    Exercises     Shoulder Instructions      Home Living Family/patient expects to be discharged to:: Private residence Living Arrangements: Spouse/significant other Available Help at Discharge: Family;Available 24 hours/day Type of Home: Apartment Home Access: Stairs to enter Entrance Stairs-Number of Steps: 3 Entrance Stairs-Rails: None Home Layout: One level     Bathroom Shower/Tub: Teacher, early years/pre: Standard     Home Equipment: Conservation officer, nature (2 wheels);Wheelchair - manual;Cane - single point;Shower seat   Additional Comments: doors in house too narrow for w/c and walker, furniture walks in/out of bathroom      Prior Functioning/Environment Prior Level of Function : Needs assist;Patient poor historian/Family not available       Physical Assist : ADLs (physical);Mobility (physical) Mobility (physical): Bed mobility;Transfers;Gait ADLs (physical): Bathing;IADLs;Dressing;Toileting Mobility Comments: reports using RW primarily for mobility but will use wheelchair too. husband assists with bed mobility, standing and mobility as needed. reports 5 recent falls ADLs Comments: reports assist with tub transfers, bathing tasks but able to dress self usually (later reports assist for toileting hygiene and clothing mgmt). husband completes IADLs        OT Problem List: Decreased strength;Impaired balance (sitting and/or standing);Decreased activity tolerance;Decreased cognition;Decreased safety awareness      OT Treatment/Interventions: Self-care/ADL training;Therapeutic exercise;Energy conservation;DME and/or AE instruction;Therapeutic activities;Patient/family education;Balance training    OT  Goals(Current goals  can be found in the care plan section) Acute Rehab OT Goals Patient Stated Goal: feel better soon OT Goal Formulation: With patient Time For Goal Achievement: 09/03/21 Potential to Achieve Goals: Good  OT Frequency: Min 2X/week    Co-evaluation              AM-PAC OT "6 Clicks" Daily Activity     Outcome Measure Help from another person eating meals?: A Little Help from another person taking care of personal grooming?: A Little Help from another person toileting, which includes using toliet, bedpan, or urinal?: A Lot Help from another person bathing (including washing, rinsing, drying)?: A Lot Help from another person to put on and taking off regular upper body clothing?: A Little Help from another person to put on and taking off regular lower body clothing?: A Lot 6 Click Score: 15   End of Session Equipment Utilized During Treatment: Gait belt;Rolling walker (2 wheels) Nurse Communication: Mobility status  Activity Tolerance: Patient tolerated treatment well Patient left: in chair;with call bell/phone within reach;with chair alarm set  OT Visit Diagnosis: Unsteadiness on feet (R26.81);Muscle weakness (generalized) (M62.81);Other symptoms and signs involving cognitive function;Other abnormalities of gait and mobility (R26.89);History of falling (Z91.81);Repeated falls (R29.6)                Time: 1694-5038 OT Time Calculation (min): 49 min Charges:  OT General Charges $OT Visit: 1 Visit OT Evaluation $OT Eval Moderate Complexity: 1 Mod OT Treatments $Self Care/Home Management : 23-37 mins  Malachy Chamber, OTR/L Acute Rehab Services Office: [phone]   Layla Maw 08/20/2021, 8:03 AM

## 2021-08-20 NOTE — TOC Progression Note (Signed)
Transition of Care [facility]) - Progression Note    Patient Details  Name: [name] MRN: [id number] Date of Birth: Dec 15, 1955  [facility] CM/SW Contact  Tom-Johnson, Renea Ee, RN Phone Number: 08/20/2021, 12:23 PM  Clinical Narrative:    CM spoke with patient about home health needs as she declines SNF per PT/OT recommendations. CM gave patient list of agencies from Medicare.gov and patient chose [facility]. CM made referral to [facility] and Amy [phone]) voiced acceptance. CM ordered Bedside commode from [employer] and Freda Munro to deliver at bedside. CM will continue to follow with needs.   Expected Discharge Plan: Skilled Nursing Facility Barriers to Discharge: No SNF bed, Insurance Authorization  Expected Discharge Plan and Services Expected Discharge Plan: [place]       Living arrangements for the past 2 months: Apartment                                       Social Determinants of Health (SDOH) Interventions    Readmission Risk Interventions Readmission Risk Prevention Plan 08/19/2021  Transportation Screening Complete  PCP or Specialist Appt within 3-5 Days Complete  HRI or Home Care Consult Complete  HRI or Home Care Consult comments PT recommends SNF  Social Work Consult for [place] Planning/Counseling Complete  Palliative Care Screening Not Applicable  Medication Review Press photographer) Complete  Some recent data might be hidden

## 2021-08-20 NOTE — Progress Notes (Signed)
Inpatient Diabetes Program Recommendations  AACE/ADA: New Consensus Statement on Inpatient Glycemic Control (2015)  Target Ranges:  Prepandial:   less than 140 mg/dL      Peak postprandial:   less than 180 mg/dL (1-2 hours)      Critically ill patients:  140 - 180 mg/dL   Lab Results  Component Value Date   GLUCAP 355 (H) 08/20/2021   HGBA1C 12.2 (H) 07/11/2021    Latest Reference Range & Units 08/19/21 07:03 08/19/21 11:41 08/19/21 17:17 08/19/21 20:59 08/20/21 06:45 08/20/21 11:25  Glucose-Capillary 70 - 99 mg/dL 182 (H) 229 (H) 249 (H) 261 (H) 230 (H) 355 (H)  (H): Data is abnormally high Review of Glycemic Control  Diabetes history: type 2 Outpatient Diabetes medications: Lantus 62 units BID, Novolog (not taking) Current orders for Inpatient glycemic control: Semglee 10 units daily, Novolog SENSITIVE correction scale TID  Inpatient Diabetes Program Recommendations:   Noted that blood sugars continue to be greater than 180 mg/dl. If blood sugars continue to be elevated, recommend increasing Semglee to 24 units daily (weight based 118 kg X 0.2 units/kg=23.6 units), continue Novolog correction scale as ordered, and add Novolog 3 units TID with meals if eating at least 50% of meal.  Harvel Ricks RN BSN CDE Diabetes Coordinator Pager: [phone]  8am-5pm

## 2021-08-20 NOTE — TOC Initial Note (Signed)
Transition of Care [facility]) - Initial/Assessment Note    Patient Details  Name: [name] MRN: [id number] Date of Birth: [birth year]  [facility]) CM/SW Contact:    Milinda Antis, [place] Phone Number: 08/20/2021, 10:08 AM  Clinical Narrative:                 CSW received consult for possible SNF placement at time of discharge. CSW spoke with patient.  The patient started crying and reported that she cannot go a SNF again.  Patient reports that she was there on Christmas and New Years.  She reports being alone and not liking the care.  The patient reports that her husband can assist at home and she would prefer home health.  CSW inquired about DME that the patient has at home. The patient reports that she has a walker, wheelchair, and a bedside commode, but it is too small.  CSW asked the patient if the husband could be called to confirm plan.  CSW spoke with the patient's spouse [name],[name] [phone]).  Mr. Cocker agreed that he could care for the patient at home and is in agreement with the patient returning home with home health.  The family reports that the patient worked with an agency before, but could not remember the name of the agency and are willing to accept any agency.   CSW discussed insurance authorization process and will provide Medicare ratings list.  CSW informed the patient that the CM would be informed and assist with home health.  CM and MD informed of the patient's request to go home. The patient's sister will be transporting the patient home with ready.  Skilled Nursing Rehab Facilities-   [email or web address]   Ratings out of 5 possible   Name Address  Phone # [place] Inspection Overall  [facility] [address] [place] 5 5 2 4   [facility]  [address], [place] [phone] 4 2 5 5   [facility] [place], [place] 4 1 1 1   [name] [place], [place] 2 2 4 4   [facility] [address], [place] 2 1 2 1   [place] 3 1 4 3   [facility] [address] [place] 5 2 2 3   [facility] [address] [place] 4 1 2 1   [address] ([place]) [place], [address] [phone] 5 1 2 2   [facility] [address], [facility] [phone] 4 1 1 1   [facility] [address], [facility] [phone] 4 1 2 1   [facility] ([place]) [name]. [place], [address] [phone] 4 1 1 1           [place] 4 2 3 3   [facility] [place] [address] [place] 3 1 5 4   [facility], [place] [address] [place] [address], [address] [phone] 2 1 1 1   [facility] [address], [place] [phone] 2 2 3 3           [address] (no [facility]) [address], [place] [phone] 4 5 5 5   [facility] (No Humana) [address], [place] 4 1 4 3   [facility] (No UHC) [address] [place] [address] 5 5 5  5  [facility] [address], [place] [phone] 3 3 4 4   [place] [address] [place]  [phone] 2 2 2 2   [facility]ustin Flock [address] [place] [phone] 3 1 3 2   [place] [address] [place] 1 1 2 1   [facility] [address], [place] [phone] 2 1 1 1   [place], [place] 5 2 4 5   [facility] [address] [place] 2 1 1 1   [facility] [address] [place] [phone] 3 1 1 1   [facility] [place], [place] [phone] 2 1 2 1           [facility] [place] [address], [place] [phone]  5 3 3 4   [place] [address] [place] 2 1 1 1   [place] (No Humana) [address], [place] 2 1 2 1   [facility] [address] [place] [phone] 3 1 1 1           [facility] [place], [place] 4 1 5 4   [facility]) [place], [place] 2 1 3 2   [facility]) [place] [address] [place] 3 1 4 3   [place] [address] [place] 4 1 4 3   [address] [place], [place] 3 3 1 1   [place] [facility]) [address] [place] [phone] 3 2 3 3      Expected Discharge Plan: [place] Barriers to Discharge: No SNF bed, Insurance Authorization   Patient Goals and CMS Choice Patient states their goals for this hospitalization and ongoing recovery are:: To go home with husband CMS Medicare.gov Compare Post Acute Care list provided to:: Patient Represenative (must comment) Choice offered to / list presented to : Patient, Spouse  Expected Discharge Plan and Services Expected Discharge Plan: [place]       Living arrangements for the past 2 months: Apartment                                      Prior Living Arrangements/Services Living arrangements for the past 2 months: Apartment Lives with:: Self, Spouse Patient language and need for interpreter reviewed:: Yes        Need for Family Participation in Patient Care: Yes (Comment) Care giver support system in place?: Yes (comment)   Criminal Activity/Legal Involvement Pertinent to Current Situation/Hospitalization: No - Comment as needed  Activities of Daily Living Home Assistive Devices/Equipment: None ADL Screening (condition at time of admission) Patient's cognitive ability adequate to safely complete daily activities?: Yes Is the patient deaf or  have difficulty hearing?: No Does the patient have difficulty seeing, even when wearing glasses/contacts?: No Does the patient have difficulty concentrating, remembering, or making decisions?: Yes Patient able to express need for assistance with ADLs?: Yes Does the patient have difficulty dressing or bathing?: Yes Independently performs ADLs?: No Communication: Independent Dressing (OT): Needs assistance Is this a change from baseline?: Pre-admission baseline Grooming: Needs assistance Is this a change from baseline?: Pre-admission baseline Feeding: Independent Bathing: Needs assistance Is this a change from baseline?: Pre-admission baseline Toileting: Needs assistance Is this a change from baseline?: Pre-admission baseline In/Out Bed: Needs assistance Is this a change from baseline?:  Pre-admission baseline Walks in Home: Dependent Is this a change from baseline?: Pre-admission baseline Does the patient have difficulty walking or climbing stairs?: Yes Weakness of Legs: Both Weakness of Arms/Hands: None  Permission Sought/Granted                  Emotional Assessment Appearance:: Other (Comment Required (Unable to observe patient as patient is on contact precautions) Attitude/Demeanor/Rapport: Crying   Orientation: : Oriented to Self, Oriented to Place, Oriented to  Time, Oriented to Situation Alcohol / Substance Use: Not Applicable Psych Involvement: No (comment)  Admission diagnosis:  Hypokalemia [E87.6] Adnexal mass [N94.89] Elevated troponin [R77.8] NSTEMI (non-ST elevated myocardial infarction) ([place]) [I21.4] AMS (altered mental status) [R41.82] COVID [U07.1] COVID-19 virus infection [U07.1] Patient Active Problem List   Diagnosis Date Noted   COVID-19 virus infection 08/17/2021   COVID 08/17/2021   High anion gap metabolic acidosis 96/29/5284   Dehydration 07/11/2021   Pseudohyponatremia 07/11/2021   Acute kidney injury superimposed on CKD ([place]) 07/11/2021    Hypoalbuminemia due to protein-calorie malnutrition ([place]) 07/11/2021   Low TSH level 07/11/2021   Noncompliance with medication regimen 07/11/2021   Altered mental status 13/24/4010   Acute metabolic encephalopathy 27/25/3664   Hyperosmolar hyperglycemic state (HHS) ([place]) 07/10/2021   Nausea & vomiting 12/01/2020   Ovarian mass, left 12/01/2020   Hypokalemia 12/01/2020   Leukocytosis 12/01/2020   Generalized weakness 11/30/2020   Acute respiratory failure with hypoxia ([place]) 01/24/2020   Chest pain 01/23/2020   Acute exacerbation of CHF (congestive heart failure) ([place]) 01/23/2020   Functional urinary incontinence 09/07/2019   Urge incontinence of urine 09/07/2019   Legally blind in right eye, as defined in [place] 08/11/2018   Bilateral carpal tunnel syndrome 04/13/2018   Adenomatous polyp of colon 04/13/2018   Fibrocystic breast changes, right 12/12/2017   Drug-induced constipation 09/15/2017   Primary osteoarthritis of both knees 04/05/2017   Chronic bilateral low back pain 03/07/2017   Chronic pain of right knee 03/07/2017   Vitamin B12 deficiency 12/09/2016   Incidental lung nodule, > 58mm and < 25mm 09/14/2016   Vitreous hemorrhage of right eye ([place])    Diabetic gastroparesis associated with type 2 diabetes mellitus ([place]) 09/12/2015   Controlled type 2 diabetes mellitus with complication, with long-term current use of insulin ([place]) 09/12/2015   Chronic renal insufficiency, stage III (moderate) (HCC) 03/19/2015   Prolonged Q-T interval on ECG 08/10/2013   Dyslipidemia 04/02/2013   Acute on chronic diastolic heart failure ([place]) 04/07/2012   Presumed OSA (obstructive sleep apnea) 04/06/2012   NSTEMI  post-op 04/04/12-medical Rx 04/04/2012   Chronic diastolic heart failure ([place]) 04/2012   Severe obesity (BMI >= 40) ([place]) 01/17/2012   GERD 08/21/2009   Complex endometrial hyperplasia with atypia 03/20/2009   PERIPHERAL EDEMA 03/20/2009   Coronary atherosclerosis-not a surgical or PCI  candidate 2010 02/27/2009   Proliferative diabetic retinopathy ([place]) 10/07/2007   DETACHED RETINA, BILATERAL, HX OF 09/07/2007   History of chronic Iron defeicency anemia with acute post op anemia, transfused after debridment 04/04/12 04/17/2007   Essential hypertension 04/17/2007   PCP:  Ladell Pier, MD Pharmacy:   [place], [place] [address] Phone: [phone] Fax: [phone]  [facility] [address] [place] [address] Phone: [phone] Fax: [phone]  [facility] ([place], [place] [address] [place]  [address] Phone: [phone] Fax: [place] [address] [place] [address] Phone: [phone] Fax: [phone]  [address] [place] [address] Phone: [phone] Fax: [phone]  [place], [place] [address] Phone: [phone] Fax: [phone]     Social Determinants of Health (SDOH) Interventions    Readmission Risk Interventions Readmission Risk Prevention Plan 08/19/2021  Transportation Screening Complete  PCP or Specialist Appt within 3-5 Days Complete  HRI or Home Care Consult Complete  HRI or Home Care Consult comments PT recommends SNF  Social Work Consult for [place] Planning/Counseling Complete  Palliative Care Screening Not Applicable  Medication Review (RN Care Manager) Complete  Some recent data might be hidden

## 2021-08-20 NOTE — Plan of Care (Signed)
  Problem: Activity: Goal: Risk for activity intolerance will decrease Outcome: Progressing   Problem: Nutrition: Goal: Adequate nutrition will be maintained Outcome: Progressing   Problem: Coping: Goal: Level of anxiety will decrease Outcome: Progressing   

## 2021-08-20 NOTE — Progress Notes (Signed)
PROGRESS NOTE    [name]  WUJ:[id number] DOB: 02-10-1956 DOA: 08/17/2021 PCP: Ladell Pier, MD   Chief Complaint  Patient presents with   Fatigue   Altered Mental Status  Brief Narrative/Hospital Course: [name], 66 y.o. 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  ED Course: Borderline bradycardia, no hypotension.  Chest x-ray no clear infiltrates, no hypoxia. COVID positive.Blood work potassium 2.6, troponin 57> 49 CT abdomen showed chronic left adnexa mass enlarged in size compared to previous study.  Head CT negative for acute findings.   Subjective: Seen and examined this morning.  Resting on the bedside chair.  She reports she feels little better today Overnight afebrile Blood pressure stable Bowel movement x2 yesterday  Assessment & Plan:  COVID-19 virus infection:Without hypoxia.Continue remdesivir for incidental COVID infection. Continue airborne precautions.  Inflammatory markers relatively stable as below Recent Labs    08/17/21 1510 08/18/21 0556  DDIMER 1.40* 1.22*  FERRITIN 211 209  LDH 196*  --   CRP  --  1.1*   Persistent diarrhea likely from above, GI panel pin process-if negative can do Imodium.  Supportive care and fluid hydration will be continued. Hypokalemia due to diarrhea, repleted with oral potassium Dehydration continue on ivf.  Encourage oral intake  Elevated troponin:Mildly elevated and flat likely demand ischemia in the setting of COVID infection no chest pain no abnormal changes on the EKG.  TTE-LVEF stable 60-65% with no RWMA, small pericardial effusion without tamponade, grossly normal mitral and aortic valve.  No further work-up indicated.  Type 2 diabetes mellitus on long-term insulin: Blood sugar remains  poorly controlled, resume insulin at lower dose keep on sliding scale insulin.  Monitor Recent Labs  Lab 08/19/21 0703 08/19/21 1141 08/19/21 1717 08/19/21 2059 08/20/21 0645  GLUCAP 182* 229* 249* 261* 230*    PSVT/HTN: BP stable continue on Imdur,  short acting Cardizem.   AKI CKD stage IIIb: Stable renal function NOW, aki improved.continue gentle IV fluids given ongoing diarrhea.   Recent Labs  Lab 08/17/21 1245 08/18/21 0556 08/19/21 0723 08/20/21 0211  BUN 20 21 19 16   CREATININE 1.90* 1.52* 1.23* 1.26*    Class III Obesity:Patient's Body mass index is 46.08 kg/m. Will benefit with PCP follow-up,weight loss  healthy lifestyle and outpatient sleep evaluation.  Physical deconditioning severe with weakness debility in the setting of COVID-19 vaccine obtain PT OT evaluation.  Patient relentlessly nursing facility placement  DVT prophylaxis: heparin injection 5,000 Units Start: 08/17/21 1700 Code Status:   Code Status: Full Code Family Communication:Plan of care discussed with patient at bedside. Status is: inpatient. Remains hospitalized for ongoing management of diarrhea persistent electrolyte imbalance severe deconditioning debility Disposition:Currently medically stable for discharge. Anticipated Disposition: Skilled nursing facility .patient has declined  SNF and would like to return home.  Anticipate discharge home tomorrow.    Total time spent in the care of this patient 35 MINUTES Objective: Vitals last 24 hrs: Vitals:   08/19/21 1720 08/19/21 2101 08/20/21 0507 08/20/21 1128  BP: (!) 139/58 133/61 (!) 162/68 (!) 143/66  Pulse: (!) 55 (!) 54 (!) 59 64  Resp: 20 18 17 18   Temp: 98.2 F (36.8 C) 97.9 F (36.6 C) 97.7 F (36.5 C) (!) 97.5 F (36.4 C)  TempSrc:  Oral Oral Oral  SpO2: 100% 100% 100% 99%  Weight:      Height:       Weight change:   Intake/Output Summary (Last 24 hours) at 08/20/2021 1133 Last data filed at 08/20/2021 0600 Gross per 24 hour   Intake 1267.21 ml  Output 850 ml  Net 417.21 ml   Net IO Since Admission: 1,037.21 mL [08/20/21 1133]   Physical Examination: General exam: AAOx 3 older than stated age, weak appearing. HEENT:Oral mucosa moist, Ear/Nose WNL grossly, dentition normal. Respiratory system: bilaterally clear, no use of accessory muscle Cardiovascular system: S1 & S2 +, No JVD,. Gastrointestinal system: Abdomen soft,NT,ND, BS+ Nervous System:Alert, awake, moving extremities and grossly nonfocal Extremities: no edema, distal peripheral pulses palpable.  Skin: No rashes,no icterus. MSK: Normal muscle bulk,tone, power   Medications reviewed:  Scheduled Meds:  aspirin EC  81 mg Oral Daily   atorvastatin  80 mg Oral Daily   brimonidine  1 drop Both Eyes BID   diltiazem  60 mg Oral Q6H   heparin  5,000 Units Subcutaneous Q12H   insulin aspart  0-9 Units Subcutaneous TID WC   insulin glargine-yfgn  10 Units Subcutaneous Daily   isosorbide mononitrate  120 mg Oral Daily   phosphorus  250 mg Oral TID   timolol  1 drop Both Eyes BID   Continuous Infusions:  lactated ringers 50 mL/hr at 08/19/21 1329   remdesivir 100 mg in NS 100 mL 100 mg (08/19/21 1134)   Diet Order             Diet renal/carb modified with fluid restriction Diet-HS Snack? Nothing; Fluid restriction: 1200 mL Fluid; Room service appropriate? Yes; Fluid consistency: Thin  Diet effective now                 Weight change:   Wt Readings from Last 3 Encounters:  08/17/21 118 kg  07/10/21 117.9 kg  04/15/21 117 kg     Consultants:see note  Procedures:see note Antimicrobials: Anti-infectives (From admission, onward)    Start     Dose/Rate Route Frequency Ordered Stop   08/18/21 1000  remdesivir 100 mg in sodium chloride 0.9 % 100 mL IVPB       See Hyperspace for full Linked Orders Report.   100 mg 200 mL/hr over 30 Minutes Intravenous Daily 08/17/21 1606 08/22/21 0959   08/17/21 1900  remdesivir 200 mg in sodium chloride  0.9% 250 mL IVPB       See Hyperspace for full Linked Orders Report.   200 mg 580 mL/hr over 30 Minutes Intravenous Once 08/17/21 1606 08/17/21 2104      Culture/Microbiology Other culture-see note Unresulted Labs (From admission, onward)     Start     Ordered   08/19/21 1117  Gastrointestinal Panel by PCR , Stool  (Gastrointestinal Panel by PCR, Stool                                                                                                                                                     **  Does Not include CLOSTRIDIUM DIFFICILE testing. **If CDIFF testing is needed, place order from the "C Difficile Testing" order set.**)  Once,   R        08/19/21 1117   08/19/21 0500  CBC  Daily,   R     Question:  Specimen collection method  Answer:  Lab=Lab collect   08/18/21 1319   08/18/21 0500  Comprehensive metabolic panel  Daily,   R      08/17/21 1537   08/18/21 0500  Phosphorus  Daily,   R      08/17/21 1537           Data Reviewed: I have personally reviewed following labs and imaging studies CBC: Recent Labs  Lab 08/17/21 1245 08/18/21 0556 08/19/21 0723 08/20/21 0211  WBC 10.9* 8.2 8.6 7.1  NEUTROABS 7.7 4.9  --   --   HGB 14.3 12.3 11.7* 10.6*  HCT 44.2 37.3 36.2 32.9*  MCV 93.1 93.5 93.5 92.7  PLT 236 189 190 545   Basic Metabolic Panel: Recent Labs  Lab 08/17/21 1245 08/17/21 1510 08/18/21 0556 08/18/21 1733 08/19/21 0723 08/20/21 0211  NA 137  --  138  --  135 135  K 2.6*  --  2.8* 4.7 3.9 3.4*  CL 100  --  105  --  107 109  CO2 23  --  22  --  19* 19*  GLUCOSE 85  --  122*  --  213* 267*  BUN 20  --  21  --  19 16  CREATININE 1.90*  --  1.52*  --  1.23* 1.26*  CALCIUM 9.3  --  8.3*  --  8.5* 8.4*  MG 2.2  --  2.0  --  2.0  --   PHOS  --  3.9 3.8  --  2.7 2.2*   GFR: Estimated Creatinine Clearance: 54.5 mL/min (A) (by C-G formula based on SCr of 1.26 mg/dL (H)). Liver Function Tests: Recent Labs  Lab 08/17/21 1245 08/18/21 0556  08/19/21 0723 08/20/21 0211  AST 28 16 17 15   ALT 16 12 12 10   ALKPHOS 67 55 51 47  BILITOT 0.8 0.5 0.7 0.4  PROT 8.0 6.5 6.5 5.8*  ALBUMIN 3.0* 2.5* 2.6* 2.2*   Recent Labs  Lab 08/17/21 1245  LIPASE 37   Recent Labs  Lab 08/17/21 1724  AMMONIA 55*   Coagulation Profile: No results for input(s): INR, PROTIME in the last 168 hours. Cardiac Enzymes: No results for input(s): CKTOTAL, CKMB, CKMBINDEX, TROPONINI in the last 168 hours. BNP (last 3 results) No results for input(s): PROBNP in the last 8760 hours. HbA1C: No results for input(s): HGBA1C in the last 72 hours. CBG: Recent Labs  Lab 08/19/21 0703 08/19/21 1141 08/19/21 1717 08/19/21 2059 08/20/21 0645  GLUCAP 182* 229* 249* 261* 230*   Lipid Profile: No results for input(s): CHOL, HDL, LDLCALC, TRIG, CHOLHDL, LDLDIRECT in the last 72 hours. Thyroid Function Tests: No results for input(s): TSH, T4TOTAL, FREET4, T3FREE, THYROIDAB in the last 72 hours. Anemia Panel: Recent Labs    08/17/21 1510 08/18/21 0556  FERRITIN 211 209   Sepsis Labs: Recent Labs  Lab 08/17/21 1245 08/17/21 1410 08/17/21 1510  PROCALCITON  --   --  <0.10  LATICACIDVEN 2.9* 1.8  --     Recent Results (from the past 240 hour(s))  Resp Panel by RT-PCR (Flu A&B, Covid) Nasopharyngeal Swab     Status: Abnormal   Collection Time: 08/17/21 12:29 PM  Specimen: Nasopharyngeal Swab; Nasopharyngeal(NP) swabs in vial transport medium  Result Value Ref Range Status   SARS Coronavirus 2 by RT PCR POSITIVE (A) NEGATIVE Final    Comment: (NOTE) SARS-CoV-2 target nucleic acids are DETECTED.  The SARS-CoV-2 RNA is generally detectable in upper respiratory specimens during the acute phase of infection. Positive results are indicative of the presence of the identified virus, but do not rule out bacterial infection or co-infection with other pathogens not detected by the test. Clinical correlation with patient history and other diagnostic  information is necessary to determine patient infection status. The expected result is Negative.  Fact Sheet for Patients: EntrepreneurPulse.com.au  Fact Sheet for Healthcare Providers: IncredibleEmployment.be  This test is not yet approved or cleared by the [place] FDA and  has been authorized for detection and/or diagnosis of SARS-CoV-2 by FDA under an Emergency Use Authorization (EUA).  This EUA will remain in effect (meaning this test can be used) for the duration of  the COVID-19 declaration under Section 564(b)(1) of the A ct, 21 U.S.C. section 360bbb-3(b)(1), unless the authorization is terminated or revoked sooner.     Influenza A by PCR NEGATIVE NEGATIVE Final   Influenza B by PCR NEGATIVE NEGATIVE Final    Comment: (NOTE) The Xpert Xpress SARS-CoV-2/FLU/RSV plus assay is intended as an aid in the diagnosis of influenza from Nasopharyngeal swab specimens and should not be used as a sole basis for treatment. Nasal washings and aspirates are unacceptable for Xpert Xpress SARS-CoV-2/FLU/RSV testing.  Fact Sheet for Patients: EntrepreneurPulse.com.au  Fact Sheet for Healthcare Providers: IncredibleEmployment.be  This test is not yet approved or cleared by the [place] FDA and has been authorized for detection and/or diagnosis of SARS-CoV-2 by FDA under an Emergency Use Authorization (EUA). This EUA will remain in effect (meaning this test can be used) for the duration of the COVID-19 declaration under Section 564(b)(1) of the Act, 21 U.S.C. section 360bbb-3(b)(1), unless the authorization is terminated or revoked.  Performed at [facility], [place] [address], [place], [place] [address]       Radiology Studies: No results found.   LOS: 2 days   Antonieta Pert, MD [facility]  08/20/2021, 11:33 AM

## 2021-08-21 ENCOUNTER — Other Ambulatory Visit (HOSPITAL_COMMUNITY): Payer: Self-pay

## 2021-08-21 ENCOUNTER — Telehealth: Payer: Self-pay | Admitting: Internal Medicine

## 2021-08-21 LAB — COMPREHENSIVE METABOLIC PANEL
ALT: 15 U/L (ref 0–44)
AST: 28 U/L (ref 15–41)
Albumin: 2.4 g/dL — ABNORMAL LOW (ref 3.5–5.0)
Alkaline Phosphatase: 51 U/L (ref 38–126)
Anion gap: 9 (ref 5–15)
BUN: 11 mg/dL (ref 8–23)
CO2: 20 mmol/L — ABNORMAL LOW (ref 22–32)
Calcium: 8.6 mg/dL — ABNORMAL LOW (ref 8.9–10.3)
Chloride: 108 mmol/L (ref 98–111)
Creatinine, Ser: 1.01 mg/dL — ABNORMAL HIGH (ref 0.44–1.00)
GFR, Estimated: >60 mL/min (ref >60)
Glucose, Bld: 174 mg/dL — ABNORMAL HIGH (ref 70–99)
Potassium: 3.3 mmol/L — ABNORMAL LOW (ref 3.5–5.1)
Sodium: 137 mmol/L (ref 135–145)
Total Bilirubin: 0.6 mg/dL (ref 0.3–1.2)
Total Protein: 6 g/dL — ABNORMAL LOW (ref 6.5–8.1)

## 2021-08-21 LAB — CBC
HCT: 33.6 % — ABNORMAL LOW (ref 36.0–46.0)
Hemoglobin: 11.4 g/dL — ABNORMAL LOW (ref 12.0–15.0)
MCH: 30.6 pg (ref 26.0–34.0)
MCHC: 33.9 g/dL (ref 30.0–36.0)
MCV: 90.3 fL (ref 80.0–100.0)
Platelets: 181 10*3/uL (ref 150–400)
RBC: 3.72 MIL/uL — ABNORMAL LOW (ref 3.87–5.11)
RDW: 14.4 % (ref 11.5–15.5)
WBC: 6.9 10*3/uL (ref 4.0–10.5)
nRBC: 0 % (ref 0.0–0.2)

## 2021-08-21 LAB — GLUCOSE, CAPILLARY
Glucose-Capillary: 194 mg/dL — ABNORMAL HIGH (ref 70–99)
Glucose-Capillary: 260 mg/dL — ABNORMAL HIGH (ref 70–99)
Glucose-Capillary: 219 mg/dL — ABNORMAL HIGH (ref 70–99)

## 2021-08-21 LAB — PHOSPHORUS: Phosphorus: 3 mg/dL (ref 2.5–4.6)

## 2021-08-21 MED ORDER — LOPERAMIDE HCL 2 MG PO CAPS
2.0000 mg | ORAL_CAPSULE | ORAL | 0 refills | Status: AC | PRN
  Filled 2021-08-21: qty 30, 30d supply, fill #0

## 2021-08-21 MED ORDER — LANTUS SOLOSTAR 100 UNIT/ML ~~LOC~~ SOPN
20.0000 [IU] | PEN_INJECTOR | Freq: Two times a day (BID) | SUBCUTANEOUS | 0 refills | Status: AC
Start: 2021-08-21 — End: ?

## 2021-08-21 NOTE — Progress Notes (Signed)
Stop remdesivir per Dr. Vallarie Mare, PharmD, BCIDP, AAHIVP, CPP Infectious Disease Pharmacist 08/21/2021 8:50 AM

## 2021-08-21 NOTE — Progress Notes (Signed)
Spoke with patient on the phone. States that she has had diabetes "forever" and has been on long acting insulin (she could not remember the name of it) for about 2 years. States that she takes 54 units BID at home. States that it is not Lantus. She does not take any short acting insulin.   Patient reports that her blood sugars have been high for awhile. She sees [facility] [employer] for her diabetes control, but states that she is not happy with her since it has been a while since she has seen her. Patient states that she checks her blood sugars x2 per day. Patient is aware that her HgbA1C is 12.2% and has been high.   Patient lives with husband, where the husband does all the cooking. They do not have transportation (no car) and rely on people to get them places. She states that she hopes she can find someone to pick her up at the hospital when she is discharged.   Recommended that she try to follow up with her PCP after discharge.   Harvel Ricks RN BSN CDE Diabetes Coordinator Pager: [phone]  8am-5pm

## 2021-08-21 NOTE — Telephone Encounter (Signed)
Copied from [place] [id number]. Topic: Quick Communication - Home Health Verbal Orders >> Aug 21, 2021  2:15 PM McGill, Nelva Bush wrote: Caller/Agency: [facility] Number: [phone] Requesting OT/PT/Skilled Nursing/Social Work/Speech Therapy: PT/OT Frequency: N/A   Requesting to start 08/25/21. Pt is being discharged from hospital.

## 2021-08-21 NOTE — Telephone Encounter (Signed)
Returned call and gave verbal orders

## 2021-08-21 NOTE — TOC Transition Note (Signed)
Transition of Care [facility]) - CM/SW Discharge Note   Patient Details  Name: [name] MRN: [id number] Date of Birth: [birth year]  Transition of [facility] CM/SW Contact:  Tom-Johnson, Renea Ee, RN Phone Number: 08/21/2021, 12:31 PM   Clinical Narrative:     Patient is scheduled for discharge today. Home health referral with [facility] and information on AVS. CM notified husband, [name] of discharge and also for him to pickup bedside commode as [facility] does not transport equipments.[name] states he does not have transportation and does not have anyone that could pickup equipment. States to cancel order as he can take care of patient like he always does. CM notified Freda Munro with [facility] about cancellation. [facility] called and transportation scheduled. No further TOC needs noted.    Final next level of care: [place] Barriers to Discharge: Barriers Resolved   Patient Goals and CMS Choice Patient states their goals for this hospitalization and ongoing recovery are:: To return home CMS Medicare.gov Compare Post Acute Care list provided to:: Patient Choice offered to / list presented to : Patient, Spouse  Discharge Placement                Patient to be transferred to facility by: [facility] (Home) Name of family member notified: Abundio Miu Patient and family notified of of transfer: 08/21/21  Discharge Plan and Services   Discharge Planning Services: CM Consult Post Acute Care Choice: Home Health          DME Arranged: 3-N-1 DME Agency: [facility] Date DME Agency Contacted: 08/20/21 Time DME Agency Contacted: [id number] Representative spoke with at DME Agency: Freda Munro HH Arranged: PT, OT [facility] Agency: [facility] Date [place]: 08/20/21 Time [place]: 72 Representative spoke with at [place]: Amy  Social Determinants of Health ([place]) Interventions     Readmission Risk Interventions Readmission Risk Prevention Plan 08/19/2021   Transportation Screening Complete  PCP or Specialist Appt within 3-5 Days Complete  HRI or [place] or Home Care Consult comments PT recommends SNF  Social Work Consult for [place] Planning/Counseling [place] Not Applicable  Medication Review Press photographer) Complete  Some recent data might be hidden

## 2021-08-21 NOTE — Discharge Summary (Signed)
Physician Discharge Summary  [name] YQI:[id number] DOB: Jan 12, 1956 DOA: 08/17/2021  PCP: Ladell Pier, MD  Admit date: 08/17/2021 Discharge date: 08/21/2021  Admitted From: home Disposition:  home  Recommendations for Outpatient Follow-up:  Follow up with PCP in 1-2 weeks Please obtain BMP/CBC in one week Please follow up on the following pending results:  Home Health:yes  Equipment/Devices: yes  Discharge Condition: Stable Code Status:   Code Status: Full Code Diet recommendation:  Diet Order             Diet renal/carb modified with fluid restriction Diet-HS Snack? Nothing; Fluid restriction: 1200 mL Fluid; Room service appropriate? Yes; Fluid consistency: Thin  Diet effective now                    Brief/Interim Summary:  66 y.o. 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  ED Course: Borderline bradycardia, no hypotension.  Chest x-ray no clear infiltrates, no hypoxia. COVID positive.Blood work potassium 2.6, troponin 57> 49 CT abdomen showed chronic left adnexa mass enlarged in size compared to previous study.  Head CT negative for acute findings. Patient was admitted treated with remdesivir for incidental COVID and diarrhea likely from [place] also had acute renal failure that improved with hydration.  At this time she relatively stable, remains deconditioned and weak.  PT  OT suggested skilled nursing facility but she wants to return home with home health. She remains medically stable.  Discharge Diagnoses:  COVID-19 virus infection:Without hypoxia.Completed iv remdesivir for incidental COVID infection. Continue airborne precautions.  Inflammatory markers relatively stable.  Persistent diarrhea likely from above, improved. GI panel is  negative, can continue Imodium as needed at home.  Hypokalemia due to diarrhea, repleted  Dehydration resolved. Elevated troponin:Mildly elevated and flat likely demand ischemia in the setting of COVID infection no chest pain no abnormal changes on the EKG.  TTE-LVEF stable 60-65% with no RWMA, small pericardial effusion without tamponade, grossly normal mitral and aortic valve.  No further work-up indicated.  Type 2 diabetes mellitus on long-term insulin: Blood sugar remains relatively stable patient advised to resume home regimen. CONT SSI FOR NOW Recent Labs  Lab 08/20/21 1125 08/20/21 1644 08/20/21 2118 08/21/21 0620 08/21/21 0928  GLUCAP 355* 252* 202* 194* 260*   PSVT/HTN: BP stable continue on Imdur, and resume home Cardizem upon discharge   AKI CKD stage IIIb: AKI has resolved.  Encourage oral intake.  Supportive care for diarrhea.   Recent Labs  Lab 08/17/21 1245 08/18/21 0556 08/19/21 0723 08/20/21 0211 08/21/21 0427  BUN _0 CREATININE 1.90* 1.52* 1.23* 1.26* 1.01*    Class III Obesity:Patient's Body mass index is 46.08 kg/m. Will benefit with PCP follow-up,weight loss  healthy lifestyle and outpatient sleep evaluation.   Physical deconditioning severe with weakness debility in the setting of COVID-19 vaccine obtain PT OT evaluation appreciated patient at this time wants to return home with home health does not go to SNF.  Patient is agreeable for discharge home today, I made a call to her husband and discussed plan of care and he is agreeable to receive her home today, requesting ambulance set up for transport. Consults: toc  Subjective: Alert awake this morning tolerating well no nausea vomiting no BM charted. She became tearful and excited  about going home today with home health.  Discharge Exam: Vitals:   08/21/21 0514 08/21/21 0926  BP: (!) 151/66 (!) 154/68  Pulse: 64 74  Resp: 17 17  Temp: 98.1 F (36.7 C) 98.2 F (36.8 C)  SpO2: 100%  100%   General: Pt is alert, awake, not in acute distress Cardiovascular: RRR, S1/S2 +, no rubs, no gallops Respiratory: CTA bilaterally, no wheezing, no rhonchi Abdominal: Soft, NT, ND, bowel sounds + Extremities: no edema, no cyanosis  Discharge Instructions  Discharge Instructions     Discharge instructions   Complete by: As directed    Continue COVID-19 isolation quarantine x10 days since onset of her COVID-positive  Continue to hydrate well at home at least 66 oz fluid every day and can take Imodium if needed loose stool.  Please call call MD or return to ER for similar or worsening recurring problem that brought you to hospital or if any fever,nausea/vomiting,abdominal pain, uncontrolled pain, chest pain,  shortness of breath or any other alarming symptoms.  Please follow-up your doctor as instructed in a week time and call the office for appointment.  Please avoid alcohol, smoking, or any other illicit substance and maintain healthy habits including taking your regular medications as prescribed.  You were cared for by a hospitalist during your hospital stay. If you have any questions about your discharge medications or the care you received while you were in the hospital after you are discharged, you can call the unit and ask to speak with the hospitalist on call if the hospitalist that took care of you is not available.  Once you are discharged, your primary care physician will handle any further medical issues. Please note that NO REFILLS for any discharge medications will be authorized once you are discharged, as it is imperative that you return to your primary care physician (or establish a relationship with a primary care physician if you do not have one) for your aftercare needs so that they can reassess your need for medications and monitor your lab values.  Your insulin dose has been decreased but you may start to increase insulin dose/- will  need to talk to your Doctor.  Check blood sugar 4 times a day at home  Check blood sugar 3 times a day and bedtime at home. If blood sugar running above 200 less than 70 please call your MD to adjust insulin. If blood sugars running less 100 do not use insulin and call MD. If you noticed signs and symptoms of hypoglycemia or low blood sugar like jitteriness, confusion, thirst, tremor, sweating- Check blood sugar, drink sugary drink/biscuits/sweets to increase sugar level and call MD or return to ER.   Increase activity slowly   Complete by: As directed       Allergies as of 08/21/2021       Reactions   Ativan [lorazepam] Anaphylaxis   Orange Fruit [citrus] Anaphylaxis, Other (See Comments)   Pt stated throat swelling, itching        Medication List     STOP taking these medications    acetaZOLAMIDE ER 500 MG capsule Commonly known as: DIAMOX   insulin glargine-yfgn 100 UNIT/ML injection Commonly known as: SEMGLEE   metoprolol 200 MG 24 hr tablet Commonly known as: TOPROL-XL       TAKE these medications    Accu-Chek Softclix Lancets lancets Use as instructed for 3 times daily blood glucose monitoring   acetaminophen 500 MG tablet Commonly known as: TYLENOL Take 500 mg  by mouth every 6 (six) hours as needed for moderate pain or headache.   aspirin 81 MG EC tablet Take 1 tablet (81 mg total) by mouth daily.   atorvastatin 80 MG tablet Commonly known as: LIPITOR TAKE 1 TABLET BY MOUTH ONCE DAILY IN THE EVENING AT 6  PM What changed:  how much to take how to take this when to take this additional instructions   Blood Pressure Monitor/Arm Devi 1 each by Does not apply route daily. ICD 10, I10 and I50.32   brimonidine-timolol 0.2-0.5 % ophthalmic solution Commonly known as: COMBIGAN Place 1 drop into both eyes 2 (two) times daily.   COUGH/COLD MEDICINE PO Take 1 tablet by mouth every 6 (six) hours as needed (cough).   diltiazem 240 MG 24 hr capsule Commonly known as: CARDIZEM CD Take  1 capsule (240 mg total) by mouth daily.   FreeStyle Libre 14 Day Reader Kerrin Mo Use to check blood sugar three times daily.   FreeStyle Libre 14 Day Sensor Misc Use to check blood sugar three times daily.   glucose blood test strip Use TID before meals / Dx E11.9   glucose monitoring kit monitoring kit 1 each by Does not apply route 4 (four) times daily - after meals and at bedtime.   Accu-Chek Aviva Plus w/Device Kit Used as directed   insulin aspart 100 UNIT/ML injection Commonly known as: novoLOG CBG 70 - 120: 0 units  CBG 121 - 150: 1 unit  CBG 151 - 200: 2 units  CBG 201 - 250: 3 units  CBG 251 - 300: 5 units  CBG 301 - 350: 7 units  CBG 351 - 400: 9 units   isosorbide mononitrate 60 MG 24 hr tablet Commonly known as: IMDUR Take 2 tablets (120 mg total) by mouth daily.   Lantus SoloStar 100 UNIT/ML Solostar Pen Generic drug: insulin glargine Inject 20 Units into the skin in the morning and at bedtime. Check sugar 4 times a day at home can increase insulin based upon your blood sugar  please discuss with your MD, insulin dose cut in hospital- as your oral intake was not well. What changed:  how much to take additional instructions   loperamide 2 MG capsule Commonly known as: IMODIUM Take 1 capsule (2 mg total) by mouth daily as needed for diarrhea or loose stools.   Muscle Rub 10-15 % Crea Apply 1 application topically daily as needed (knee pain).   nitroGLYCERIN 0.4 MG SL tablet Commonly known as: NITROSTAT DISSOLVE ONE TABLET UNDER THE TONGUE EVERY 5 MINUTES AS NEEDED FOR CHEST PAIN.  DO NOT EXCEED A TOTAL OF 3 DOSES IN 15 MINUTES What changed:  how much to take when to take this reasons to take this additional instructions   ReliOn Insulin Syringe 31G X 15/64" 1 ML Misc Generic drug: Insulin Syringe-Needle U-100 USE AS DIRECTED TWICE DAILY FOR INSULIN   Safety Insulin Syringes 30G X 1/2" 1 ML Misc Generic drug: Insulin Syringe-Needle U-100 Use as  directed with insulin   STOMACH RELIEF PO Take 1 tablet by mouth 2 (two) times daily as needed (indigestion).   STOOL SOFTENER LAXATIVE PO Take 1 tablet by mouth 2 (two) times daily as needed (constipation).               Durable Medical Equipment  (From admission, onward)           Start     Ordered   08/20/21 1237  For home use only DME  3 n 1  Once        08/20/21 1237            Follow-up Information     [facility] Follow up.   Specialty: Rehabilitation Why: Someone will call you to schedule first home visit. Contact information: [address]. [place] [phone]        Ladell Pier, MD Follow up in 1 week(s).   Specialty: Internal Medicine Contact information: [place] [address] [phone]         Sanda Klein, MD .   Specialty: Cardiology Contact information: [address] [place] [address] [phone]                Allergies  Allergen Reactions   Ativan [Lorazepam] Anaphylaxis   Orange Fruit [Citrus] Anaphylaxis and Other (See Comments)    Pt stated throat swelling, itching    The results of significant diagnostics from this hospitalization (including imaging, microbiology, ancillary and laboratory) are listed below for reference.    Microbiology: Recent Results (from the past 240 hour(s))  Resp Panel by RT-PCR (Flu A&B, Covid) Nasopharyngeal Swab     Status: Abnormal   Collection Time: 08/17/21 12:29 PM   Specimen: Nasopharyngeal Swab; Nasopharyngeal(NP) swabs in vial transport medium  Result Value Ref Range Status   SARS Coronavirus 2 by RT PCR POSITIVE (A) NEGATIVE Final    Comment: (NOTE) SARS-CoV-2 target nucleic acids are DETECTED.  The SARS-CoV-2 RNA is generally detectable in upper respiratory specimens during the acute phase of infection. Positive results are indicative of the presence of  the identified virus, but do not rule out bacterial infection or co-infection with other pathogens not detected by the test. Clinical correlation with patient history and other diagnostic information is necessary to determine patient infection status. The expected result is Negative.  Fact Sheet for Patients: EntrepreneurPulse.com.au  Fact Sheet for Healthcare Providers: IncredibleEmployment.be  This test is not yet approved or cleared by the [place] FDA and  has been authorized for detection and/or diagnosis of SARS-CoV-2 by FDA under an Emergency Use Authorization (EUA).  This EUA will remain in effect (meaning this test can be used) for the duration of  the COVID-19 declaration under Section 564(b)(1) of the A ct, 21 U.S.C. section 360bbb-3(b)(1), unless the authorization is terminated or revoked sooner.     Influenza A by PCR NEGATIVE NEGATIVE Final   Influenza B by PCR NEGATIVE NEGATIVE Final    Comment: (NOTE) The Xpert Xpress SARS-CoV-2/FLU/RSV plus assay is intended as an aid in the diagnosis of influenza from Nasopharyngeal swab specimens and should not be used as a sole basis for treatment. Nasal washings and aspirates are unacceptable for Xpert Xpress SARS-CoV-2/FLU/RSV testing.  Fact Sheet for Patients: EntrepreneurPulse.com.au  Fact Sheet for Healthcare Providers: IncredibleEmployment.be  This test is not yet approved or cleared by the [place] FDA and has been authorized for detection and/or diagnosis of SARS-CoV-2 by FDA under an Emergency Use Authorization (EUA). This EUA will remain in effect (meaning this test can be used) for the duration of the COVID-19 declaration under Section 564(b)(1) of the Act, 21 U.S.C. section 360bbb-3(b)(1), unless the authorization is terminated or revoked.  Performed at [facility], [place] [address] [place], [place] [address]    Gastrointestinal Panel by PCR , Stool     Status: None   Collection Time: 08/19/21  1:51 PM   Specimen: Stool  Result Value Ref Range Status   Campylobacter species NOT DETECTED NOT DETECTED Final   Plesimonas shigelloides NOT DETECTED NOT DETECTED Final   Salmonella species NOT DETECTED NOT DETECTED Final   Yersinia enterocolitica NOT DETECTED NOT DETECTED Final   Vibrio species NOT DETECTED NOT DETECTED Final   Vibrio cholerae NOT DETECTED NOT DETECTED Final   Enteroaggregative E coli (EAEC) NOT DETECTED NOT DETECTED Final   Enteropathogenic E coli (EPEC) NOT DETECTED NOT DETECTED Final   Enterotoxigenic E coli (ETEC) NOT DETECTED NOT DETECTED Final   Shiga like toxin producing E coli (STEC) NOT DETECTED NOT DETECTED Final   Shigella/Enteroinvasive E coli (EIEC) NOT DETECTED NOT DETECTED Final   Cryptosporidium NOT DETECTED NOT DETECTED Final   Cyclospora cayetanensis NOT DETECTED NOT DETECTED Final   Entamoeba histolytica NOT DETECTED NOT DETECTED Final   Giardia lamblia NOT DETECTED NOT DETECTED Final   Adenovirus F40/41 NOT DETECTED NOT DETECTED Final   Astrovirus NOT DETECTED NOT DETECTED Final   Norovirus GI/GII NOT DETECTED NOT DETECTED Final   Rotavirus A NOT DETECTED NOT DETECTED Final   Sapovirus (I, II, IV, and V) NOT DETECTED NOT DETECTED Final    Comment: Performed at [facility], [address], [place], [place] [address]    Procedures/Studies: CT Abdomen Pelvis Wo Contrast  Result Date: 08/17/2021 CLINICAL DATA:  Nonlocalized acute abdomen pain. EXAM: CT ABDOMEN AND PELVIS WITHOUT CONTRAST TECHNIQUE: Multidetector CT imaging of the abdomen and pelvis was performed following the standard protocol without IV contrast. RADIATION DOSE REDUCTION: This exam was performed according to the departmental dose-optimization program which includes automated exposure control, adjustment of the mA and/or kV according to patient size and/or use of iterative reconstruction  technique. COMPARISON:  Nov 30, 2020 FINDINGS: Lower chest: Mild dependent atelectasis of bilateral lung bases are identified. The heart size is enlarged. There is no pericardial effusion. Hepatobiliary: No focal liver abnormality is seen. No gallstones, gallbladder wall thickening, or biliary dilatation. Pancreas: Unremarkable. No pancreatic ductal dilatation or surrounding inflammatory changes. Spleen: Normal in size without focal abnormality. Adrenals/Urinary Tract: The bilateral adrenal glands are normal. There is no right hydronephrosis. Mild dilatation of the left ureter to the level of the left adnexa is less prominent compared to prior CT. The bladder is decompressed limiting evaluation. Stomach/Bowel: Stomach is within normal limits. The appendix is not seen, no inflammation is noted around cecum. No evidence of bowel wall thickening, distention, or inflammatory changes. Vascular/Lymphatic: Aortic atherosclerosis. No enlarged abdominal or pelvic lymph nodes. Reproductive: Left adnexa complex cystic ovarian mass measuring 10.9 x 5.8 x 11.2 cm enlarged compared to prior CT. Uterine fibroids are identified. Other: Small umbilical herniation of mesenteric fat is identified. Musculoskeletal: Degenerative joint changes of the spine are noted. IMPRESSION: 1. Left adnexa complex cystic ovarian mass measuring 10.9 x 5.8 x 11.2 cm enlarged compared to prior CT. This is suspicious for neoplasm. Gynecologic follow-up is recommended. 2. Mild dilatation of the left ureter to the level of the left adnexa is less prominent compared to prior CT. 3. Uterine fibroids. Aortic Atherosclerosis (ICD10-I70.0). Electronically Signed   By: Abelardo Diesel M.D.   On: 08/17/2021 13:51   CT Head Wo Contrast  Result Date: 08/17/2021 CLINICAL DATA:  Mental status change EXAM: CT HEAD WITHOUT CONTRAST TECHNIQUE: Contiguous axial images were obtained from the base of the skull through the vertex without intravenous contrast. RADIATION  DOSE REDUCTION: This exam was performed according to the departmental dose-optimization program which includes automated exposure control,  adjustment of the mA and/or kV according to patient size and/or use of iterative reconstruction technique. COMPARISON:  07/10/2021 FINDINGS: Brain: No evidence of acute infarction, hemorrhage, hydrocephalus, extra-axial collection or mass lesion/mass effect. Vascular: No hyperdense vessel or unexpected calcification. Skull: Normal. Negative for fracture or focal lesion. Sinuses/Orbits: Bubbly opacities are identified within bilateral maxillary and sphenoid sinuses with air-fluid levels. Partial opacification of the anterior ethmoid air cells. Mastoid air cells are clear. Diffuse high attenuation throughout the right globe is again noted and appears unchanged compatible with previous enucleation. Left globe appears normal. Other: None IMPRESSION: 1. No acute intracranial abnormalities. 2. Bilateral maxillary and sphenoid sinus inflammation with air-fluid levels. Electronically Signed   By: Kerby Moors M.D.   On: 08/17/2021 13:41   DG CHEST PORT 1 VIEW  Result Date: 08/17/2021 CLINICAL DATA:  AMS, fatigue, COVID positive. EXAM: PORTABLE CHEST 1 VIEW COMPARISON:  CT abdomen pelvis dated August 17, 2021 and chest radiograph dated July 10, 2021 FINDINGS: The heart is enlarged. Low lung volumes with bibasilar atelectasis. No large pleural effusion or pneumothorax. IMPRESSION: 1.  Stable cardiomegaly. 2. Low lung volumes with bibasilar atelectasis. No large pleural effusion or pneumothorax. Electronically Signed   By: Keane Police D.O.   On: 08/17/2021 14:42   ECHOCARDIOGRAM COMPLETE  Result Date: 08/18/2021    ECHOCARDIOGRAM REPORT   Patient Name:   [name] Date of Exam: 08/18/2021 Medical Rec #:  [id number]        Height:       63.0 in Accession #:    [id number]       Weight:       260.1 lb Date of Birth:  [birth year]        BSA:          2.162 m Patient Age:     39 years         BP:           97/46 mmHg Patient Gender: F                HR:           58 bpm. Exam Location:  Inpatient Procedure: 2D Echo Indications:    Elevated troponin  History:        Patient has prior history of Echocardiogram examinations, most                 recent 04/13/2021. CAD, Signs/Symptoms:Shortness of Breath; Risk                 Factors:Dyslipidemia and Hypertension.  Sonographer:    Arlyss Gandy Referring Phys: [id number] Lequita Halt  Sonographer Comments: Technically challenging study due to limited acoustic windows and Technically difficult study due to poor echo windows. Image acquisition challenging due to patient body habitus and Supine. IMPRESSIONS  1. Left ventricular ejection fraction, by estimation, is 60 to 65%. The left ventricle has normal function. The left ventricle has no regional wall motion abnormalities. Left ventricular diastolic function could not be evaluated.  2. Right ventricular systolic function is normal. The right ventricular size is normal. There is normal pulmonary artery systolic pressure.  3. A small pericardial effusion is present. The pericardial effusion is circumferential. There is no evidence of cardiac tamponade.  4. The mitral valve is grossly normal. No evidence of mitral valve regurgitation. No evidence of mitral stenosis.  5. The aortic valve is tricuspid. Aortic valve regurgitation is not visualized. No aortic stenosis is present.  6.  The inferior vena cava is normal in size with greater than 50% respiratory variability, suggesting right atrial pressure of 3 mmHg. FINDINGS  Left Ventricle: Left ventricular ejection fraction, by estimation, is 60 to 65%. The left ventricle has normal function. The left ventricle has no regional wall motion abnormalities. The left ventricular internal cavity size was normal in size. There is  no left ventricular hypertrophy. Left ventricular diastolic function could not be evaluated due to nondiagnostic images. Left  ventricular diastolic function could not be evaluated. Right Ventricle: The right ventricular size is normal. Right vetricular wall thickness was not well visualized. Right ventricular systolic function is normal. There is normal pulmonary artery systolic pressure. The tricuspid regurgitant velocity is 2.00 m/s, and with an assumed right atrial pressure of 3 mmHg, the estimated right ventricular systolic pressure is 25.4 mmHg. Left Atrium: Left atrial size was normal in size. Right Atrium: Right atrial size was normal in size. Pericardium: A small pericardial effusion is present. The pericardial effusion is circumferential. There is no evidence of cardiac tamponade. Mitral Valve: The mitral valve is grossly normal. No evidence of mitral valve regurgitation. No evidence of mitral valve stenosis. Tricuspid Valve: The tricuspid valve is grossly normal. Tricuspid valve regurgitation is trivial. No evidence of tricuspid stenosis. Aortic Valve: The aortic valve is tricuspid. Aortic valve regurgitation is not visualized. No aortic stenosis is present. Pulmonic Valve: The pulmonic valve was grossly normal. Pulmonic valve regurgitation is trivial. No evidence of pulmonic stenosis. Aorta: The aortic root and ascending aorta are structurally normal, with no evidence of dilitation. Venous: The inferior vena cava is normal in size with greater than 50% respiratory variability, suggesting right atrial pressure of 3 mmHg. IAS/Shunts: The atrial septum is grossly normal.  LEFT VENTRICLE PLAX 2D LVIDd:         2.90 cm   Diastology LVIDs:         2.00 cm   LV e' medial:    2.94 cm/s LV PW:         1.10 cm   LV E/e' medial:  21.1 LV IVS:        1.10 cm   LV e' lateral:   4.90 cm/s LVOT diam:     2.10 cm   LV E/e' lateral: 12.7 LVOT Area:     3.46 cm  LEFT ATRIUM         Index LA diam:    2.60 cm 1.20 cm/m   AORTA Ao Root diam: 2.80 cm Ao Asc diam:  3.20 cm MITRAL VALVE               TRICUSPID VALVE MV Area (PHT): 2.68 cm    TR Peak  grad:   16.0 mmHg MV Decel Time: 283 msec    TR Vmax:        200.00 cm/s MV E velocity: 62.10 cm/s MV A velocity: 58.70 cm/s  SHUNTS MV E/A ratio:  1.06        Systemic Diam: 2.10 cm Eleonore Chiquito MD Electronically signed by Eleonore Chiquito MD Signature Date/Time: 08/18/2021/12:32:50 PM    Final     Labs: BNP (last 3 results) Recent Labs    04/12/21 1853 07/10/21 1740  BNP 63.1 27.0   Basic Metabolic Panel: Recent Labs  Lab 08/17/21 1245 08/17/21 1510 08/18/21 0556 08/18/21 1733 08/19/21 0723 08/20/21 0211 08/21/21 0427  NA 137  --  138  --  135 135 137  K 2.6*  --  2.8* 4.7 3.9 3.4*  3.3*  CL 100  --  105  --  107 109 108  CO2 23  --  22  --  19* 19* 20*  GLUCOSE 85  --  122*  --  213* 267* 174*  BUN 20  --  21  --  _0 CREATININE 1.90*  --  1.52*  --  1.23* 1.26* 1.01*  CALCIUM 9.3  --  8.3*  --  8.5* 8.4* 8.6*  MG 2.2  --  2.0  --  2.0  --   --   PHOS  --  3.9 3.8  --  2.7 2.2* 3.0   Liver Function Tests: Recent Labs  Lab 08/17/21 1245 08/18/21 0556 08/19/21 0723 08/20/21 0211 08/21/21 0427  AST _1 ALT _2 ALKPHOS 67 55 51 47 51  BILITOT 0.8 0.5 0.7 0.4 0.6  PROT 8.0 6.5 6.5 5.8* 6.0*  ALBUMIN 3.0* 2.5* 2.6* 2.2* 2.4*   Recent Labs  Lab 08/17/21 1245  LIPASE 37   Recent Labs  Lab 08/17/21 1724  AMMONIA 55*   CBC: Recent Labs  Lab 08/17/21 1245 08/18/21 0556 08/19/21 0723 08/20/21 0211 08/21/21 0427  WBC 10.9* 8.2 8.6 7.1 6.9  NEUTROABS 7.7 4.9  --   --   --   HGB 14.3 12.3 11.7* 10.6* 11.4*  HCT 44.2 37.3 36.2 32.9* 33.6*  MCV 93.1 93.5 93.5 92.7 90.3  PLT 236 189 190 163 181   Cardiac Enzymes: No results for input(s): CKTOTAL, CKMB, CKMBINDEX, TROPONINI in the last 168 hours. BNP: Invalid input(s): POCBNP CBG: Recent Labs  Lab 08/20/21 1125 08/20/21 1644 08/20/21 2118 08/21/21 0620 08/21/21 0928  GLUCAP 355* 252* 202* 194* 260*   D-Dimer No results for input(s): DDIMER in the last 72 hours. Hgb  A1c No results for input(s): HGBA1C in the last 72 hours. Lipid Profile No results for input(s): CHOL, HDL, LDLCALC, TRIG, CHOLHDL, LDLDIRECT in the last 72 hours. Thyroid function studies No results for input(s): TSH, T4TOTAL, T3FREE, THYROIDAB in the last 72 hours.  Invalid input(s): FREET3 Anemia work up No results for input(s): VITAMINB12, FOLATE, FERRITIN, TIBC, IRON, RETICCTPCT in the last 72 hours. Urinalysis    Component Value Date/Time   COLORURINE YELLOW 08/17/2021 [place] 08/17/2021 1256   LABSPEC 1.025 08/17/2021 1256   PHURINE 5.5 08/17/2021 1256   GLUCOSEU NEGATIVE 08/17/2021 1256   HGBUR NEGATIVE 08/17/2021 1256   HGBUR large 04/03/2010 0931   BILIRUBINUR NEGATIVE 08/17/2021 1256   KETONESUR NEGATIVE 08/17/2021 1256   PROTEINUR NEGATIVE 08/17/2021 1256   UROBILINOGEN 0.2 03/22/2013 1159   NITRITE NEGATIVE 08/17/2021 1256   LEUKOCYTESUR NEGATIVE 08/17/2021 1256   Sepsis Labs Invalid input(s): PROCALCITONIN,  WBC,  LACTICIDVEN Microbiology Recent Results (from the past 240 hour(s))  Resp Panel by RT-PCR (Flu A&B, Covid) Nasopharyngeal Swab     Status: Abnormal   Collection Time: 08/17/21 12:29 PM   Specimen: Nasopharyngeal Swab; Nasopharyngeal(NP) swabs in vial transport medium  Result Value Ref Range Status   SARS Coronavirus 2 by RT PCR POSITIVE (A) NEGATIVE Final    Comment: (NOTE) SARS-CoV-2 target nucleic acids are DETECTED.  The SARS-CoV-2 RNA is generally detectable in upper respiratory specimens during the acute phase of infection. Positive results are indicative of the presence of the identified virus, but do not rule out bacterial infection or co-infection with other pathogens not detected by the test. Clinical correlation with patient history and other  diagnostic information is necessary to determine patient infection status. The expected result is Negative.  Fact Sheet for  Patients: EntrepreneurPulse.com.au  Fact Sheet for Healthcare Providers: IncredibleEmployment.be  This test is not yet approved or cleared by the [place] FDA and  has been authorized for detection and/or diagnosis of SARS-CoV-2 by FDA under an Emergency Use Authorization (EUA).  This EUA will remain in effect (meaning this test can be used) for the duration of  the COVID-19 declaration under Section 564(b)(1) of the A ct, 21 U.S.C. section 360bbb-3(b)(1), unless the authorization is terminated or revoked sooner.     Influenza A by PCR NEGATIVE NEGATIVE Final   Influenza B by PCR NEGATIVE NEGATIVE Final    Comment: (NOTE) The Xpert Xpress SARS-CoV-2/FLU/RSV plus assay is intended as an aid in the diagnosis of influenza from Nasopharyngeal swab specimens and should not be used as a sole basis for treatment. Nasal washings and aspirates are unacceptable for Xpert Xpress SARS-CoV-2/FLU/RSV testing.  Fact Sheet for Patients: EntrepreneurPulse.com.au  Fact Sheet for Healthcare Providers: IncredibleEmployment.be  This test is not yet approved or cleared by the [place] FDA and has been authorized for detection and/or diagnosis of SARS-CoV-2 by FDA under an Emergency Use Authorization (EUA). This EUA will remain in effect (meaning this test can be used) for the duration of the COVID-19 declaration under Section 564(b)(1) of the Act, 21 U.S.C. section 360bbb-3(b)(1), unless the authorization is terminated or revoked.  Performed at [facility], [place] [address], [place], [place] [address]   Gastrointestinal Panel by PCR , Stool     Status: None   Collection Time: 08/19/21  1:51 PM   Specimen: Stool  Result Value Ref Range Status   Campylobacter species NOT DETECTED NOT DETECTED Final   Plesimonas shigelloides NOT DETECTED NOT DETECTED Final   Salmonella species NOT DETECTED NOT DETECTED Final    Yersinia enterocolitica NOT DETECTED NOT DETECTED Final   Vibrio species NOT DETECTED NOT DETECTED Final   Vibrio cholerae NOT DETECTED NOT DETECTED Final   Enteroaggregative E coli (EAEC) NOT DETECTED NOT DETECTED Final   Enteropathogenic E coli (EPEC) NOT DETECTED NOT DETECTED Final   Enterotoxigenic E coli (ETEC) NOT DETECTED NOT DETECTED Final   Shiga like toxin producing E coli (STEC) NOT DETECTED NOT DETECTED Final   Shigella/Enteroinvasive E coli (EIEC) NOT DETECTED NOT DETECTED Final   Cryptosporidium NOT DETECTED NOT DETECTED Final   Cyclospora cayetanensis NOT DETECTED NOT DETECTED Final   Entamoeba histolytica NOT DETECTED NOT DETECTED Final   Giardia lamblia NOT DETECTED NOT DETECTED Final   Adenovirus F40/41 NOT DETECTED NOT DETECTED Final   Astrovirus NOT DETECTED NOT DETECTED Final   Norovirus GI/GII NOT DETECTED NOT DETECTED Final   Rotavirus A NOT DETECTED NOT DETECTED Final   Sapovirus (I, II, IV, and V) NOT DETECTED NOT DETECTED Final    Comment: Performed at [facility], [address] [place], [place] [address]     Time coordinating discharge: 35 minutes  SIGNED: Antonieta Pert, MD  [facility] 08/21/2021, 11:29 AM  If 7PM-7AM, please contact night-coverage [email or web address]

## 2021-08-24 ENCOUNTER — Telehealth: Payer: Self-pay

## 2021-08-24 ENCOUNTER — Telehealth: Payer: Self-pay | Admitting: Internal Medicine

## 2021-08-24 NOTE — Telephone Encounter (Signed)
Transition Care Management Follow-up Telephone Call  Patient's husband, [name], answered. DPR on file to speak with him. He said patient was sleeping  Date of discharge and from where: 08/21/2021, [facility]  How have you been since you were released from the hospital? He said she has been sluggish, not really eating. He said she has been having some diarrhea. He gave her imodium once and it helped but he has not given it to her again. Any questions or concerns? Yes - [name] said he was busy and was doing "something important" and this call was being made on his time and he did not have time to answer questions even though he said he knows the call is important.  Instructed him to call back to this clinic when he has time.  Patient also needs to schedule a hospital follow up appointment.   Items Reviewed: Did the pt receive and understand the discharge instructions provided?  Her husband said he will need to review the instructions  Medications obtained and verified?  [name] said he will have to review the list to make sure that his wife has everything and understands what she is taking.  She has a glucometer but has not checked blood sugars. When asked if he helps her check blood sugars, he said he has not done this yet.  He said she takes her lantus 54 units twice daily. This CM explained that the order is lantus 20 units in the morning and at bedtime. He said he will need to review all of the instructions to make sure she is taking the insulin correctly. She is on sliding scale novolog but will need to check her blood sugars to administer it per orders.    Other? No  Any new allergies since your discharge? No  Dietary orders reviewed? No Do you have support at home? Yes   Home Care and Equipment/Supplies: Were home health services ordered? yes If so, what is the name of the agency? [facility]  Has the agency set up a time to come to the patient's home? Not addressed, her husband wanted to end  the call.  Were any new equipment or medical supplies ordered?  No What is the name of the medical supply agency? N/a Were you able to get the supplies/equipment? not applicable Do you have any questions related to the use of the equipment or supplies? No  Functional Questionnaire: (I = Independent and D = Dependent) ADLs: her husband is providing needed assistance at this time. He wanted to end the call before discussing her functional status and DME needs.    Follow up appointments reviewed:  PCP Hospital f/u appt confirmed?  No appointment scheduled yet.  [facility] f/u appt confirmed?  None scheduled yet.    Are transportation arrangements needed?  No addressed.  If their condition worsens, is the pt aware to call PCP or go to the Emergency Dept.? Yes Was the patient provided with contact information for the PCP's office or ED? Yes Was to pt encouraged to call back with questions or concerns? Yes

## 2021-08-24 NOTE — Telephone Encounter (Signed)
Verbal orders given to [facility] for request-   OT/PT/Skilled Nursing/Social Work/Speech Therapy: PT/OT  Starting 09/03/2020

## 2021-08-24 NOTE — Telephone Encounter (Signed)
[name] called back in stating if they can push the verbal orders to February 2nd, please advise.

## 2021-08-24 NOTE — Telephone Encounter (Signed)
[place] physicl therapist, clled in states pt was unable to do pt today, due to covid and not feeling well. They will try to proceed next week.

## 2021-08-25 ENCOUNTER — Other Ambulatory Visit: Payer: Self-pay

## 2021-08-25 ENCOUNTER — Emergency Department (HOSPITAL_COMMUNITY): Payer: Medicare Other

## 2021-08-25 ENCOUNTER — Emergency Department (HOSPITAL_COMMUNITY)
Admission: EM | Admit: 2021-08-25 | Discharge: 2021-08-25 | Disposition: A | Discharge status: Home / Self Care | Payer: Medicare Other | Attending: Emergency Medicine | Admitting: Emergency Medicine

## 2021-08-25 DIAGNOSIS — R6889 Other general symptoms and signs: Secondary | ICD-10-CM | POA: Diagnosis not present

## 2021-08-25 DIAGNOSIS — R1030 Lower abdominal pain, unspecified: Secondary | ICD-10-CM | POA: Insufficient documentation

## 2021-08-25 DIAGNOSIS — I2510 Atherosclerotic heart disease of native coronary artery without angina pectoris: Secondary | ICD-10-CM | POA: Diagnosis not present

## 2021-08-25 DIAGNOSIS — I700 Atherosclerosis of aorta: Secondary | ICD-10-CM | POA: Diagnosis not present

## 2021-08-25 DIAGNOSIS — Z7401 Bed confinement status: Secondary | ICD-10-CM | POA: Diagnosis not present

## 2021-08-25 DIAGNOSIS — E162 Hypoglycemia, unspecified: Secondary | ICD-10-CM | POA: Diagnosis not present

## 2021-08-25 DIAGNOSIS — R404 Transient alteration of awareness: Secondary | ICD-10-CM | POA: Diagnosis not present

## 2021-08-25 DIAGNOSIS — Z7982 Long term (current) use of aspirin: Secondary | ICD-10-CM | POA: Diagnosis not present

## 2021-08-25 DIAGNOSIS — Z20822 Contact with and (suspected) exposure to covid-19: Secondary | ICD-10-CM | POA: Insufficient documentation

## 2021-08-25 DIAGNOSIS — Z7984 Long term (current) use of oral hypoglycemic drugs: Secondary | ICD-10-CM | POA: Insufficient documentation

## 2021-08-25 DIAGNOSIS — R55 Syncope and collapse: Secondary | ICD-10-CM | POA: Insufficient documentation

## 2021-08-25 DIAGNOSIS — Z743 Need for continuous supervision: Secondary | ICD-10-CM | POA: Diagnosis not present

## 2021-08-25 DIAGNOSIS — E1122 Type 2 diabetes mellitus with diabetic chronic kidney disease: Secondary | ICD-10-CM | POA: Diagnosis not present

## 2021-08-25 DIAGNOSIS — R0689 Other abnormalities of breathing: Secondary | ICD-10-CM | POA: Diagnosis not present

## 2021-08-25 DIAGNOSIS — N189 Chronic kidney disease, unspecified: Secondary | ICD-10-CM | POA: Insufficient documentation

## 2021-08-25 DIAGNOSIS — I6782 Cerebral ischemia: Secondary | ICD-10-CM | POA: Diagnosis not present

## 2021-08-25 DIAGNOSIS — E11649 Type 2 diabetes mellitus with hypoglycemia without coma: Secondary | ICD-10-CM | POA: Diagnosis not present

## 2021-08-25 DIAGNOSIS — Z79899 Other long term (current) drug therapy: Secondary | ICD-10-CM | POA: Insufficient documentation

## 2021-08-25 DIAGNOSIS — R109 Unspecified abdominal pain: Secondary | ICD-10-CM | POA: Diagnosis not present

## 2021-08-25 DIAGNOSIS — Z794 Long term (current) use of insulin: Secondary | ICD-10-CM | POA: Insufficient documentation

## 2021-08-25 DIAGNOSIS — R531 Weakness: Secondary | ICD-10-CM | POA: Diagnosis not present

## 2021-08-25 DIAGNOSIS — E1165 Type 2 diabetes mellitus with hyperglycemia: Secondary | ICD-10-CM | POA: Insufficient documentation

## 2021-08-25 DIAGNOSIS — J9811 Atelectasis: Secondary | ICD-10-CM | POA: Diagnosis not present

## 2021-08-25 DIAGNOSIS — I517 Cardiomegaly: Secondary | ICD-10-CM | POA: Diagnosis not present

## 2021-08-25 DIAGNOSIS — E161 Other hypoglycemia: Secondary | ICD-10-CM | POA: Diagnosis not present

## 2021-08-25 LAB — RESP PANEL BY RT-PCR (FLU A&B, COVID) ARPGX2
Influenza A by PCR: NEGATIVE
Influenza B by PCR: NEGATIVE
SARS Coronavirus 2 by RT PCR: NEGATIVE

## 2021-08-25 LAB — CBC WITH DIFFERENTIAL/PLATELET
Abs Immature Granulocytes: 0.04 10*3/uL (ref 0.00–0.07)
Basophils Absolute: 0 10*3/uL (ref 0.0–0.1)
Basophils Relative: 0 %
Eosinophils Absolute: 0.1 10*3/uL (ref 0.0–0.5)
Eosinophils Relative: 1 %
HCT: 46.6 % — ABNORMAL HIGH (ref 36.0–46.0)
Hemoglobin: 15.1 g/dL — ABNORMAL HIGH (ref 12.0–15.0)
Immature Granulocytes: 0 %
Lymphocytes Relative: 15 %
Lymphs Abs: 1.6 10*3/uL (ref 0.7–4.0)
MCH: 30.4 pg (ref 26.0–34.0)
MCHC: 32.4 g/dL (ref 30.0–36.0)
MCV: 93.8 fL (ref 80.0–100.0)
Monocytes Absolute: 1 10*3/uL (ref 0.1–1.0)
Monocytes Relative: 9 %
Neutro Abs: 7.9 10*3/uL — ABNORMAL HIGH (ref 1.7–7.7)
Neutrophils Relative %: 75 %
Platelets: 275 10*3/uL (ref 150–400)
RBC: 4.97 MIL/uL (ref 3.87–5.11)
RDW: 15.1 % (ref 11.5–15.5)
WBC: 10.6 10*3/uL — ABNORMAL HIGH (ref 4.0–10.5)
nRBC: 0 % (ref 0.0–0.2)

## 2021-08-25 LAB — COMPREHENSIVE METABOLIC PANEL
ALT: 38 U/L (ref 0–44)
AST: 55 U/L — ABNORMAL HIGH (ref 15–41)
Albumin: 2.8 g/dL — ABNORMAL LOW (ref 3.5–5.0)
Alkaline Phosphatase: 72 U/L (ref 38–126)
Anion gap: 13 (ref 5–15)
BUN: 8 mg/dL (ref 8–23)
CO2: 22 mmol/L (ref 22–32)
Calcium: 8.9 mg/dL (ref 8.9–10.3)
Chloride: 103 mmol/L (ref 98–111)
Creatinine, Ser: 1.05 mg/dL — ABNORMAL HIGH (ref 0.44–1.00)
GFR, Estimated: 59 mL/min — ABNORMAL LOW (ref >60)
Glucose, Bld: 74 mg/dL (ref 70–99)
Potassium: 2.6 mmol/L — CL (ref 3.5–5.1)
Sodium: 138 mmol/L (ref 135–145)
Total Bilirubin: 0.8 mg/dL (ref 0.3–1.2)
Total Protein: 7.6 g/dL (ref 6.5–8.1)

## 2021-08-25 LAB — CBG MONITORING, ED
Glucose-Capillary: 105 mg/dL — ABNORMAL HIGH (ref 70–99)
Glucose-Capillary: 87 mg/dL (ref 70–99)
Glucose-Capillary: 217 mg/dL — ABNORMAL HIGH (ref 70–99)

## 2021-08-25 LAB — TROPONIN I (HIGH SENSITIVITY)
Troponin I (High Sensitivity): 13 ng/L (ref <18)
Troponin I (High Sensitivity): 16 ng/L (ref <18)

## 2021-08-25 LAB — CK: Total CK: 72 U/L (ref 38–234)

## 2021-08-25 LAB — I-STAT VENOUS BLOOD GAS, ED
Acid-Base Excess: 0 mmol/L (ref 0.0–2.0)
Bicarbonate: 22.7 mmol/L (ref 20.0–28.0)
Calcium, Ion: 0.99 mmol/L — ABNORMAL LOW (ref 1.15–1.40)
HCT: 46 % (ref 36.0–46.0)
Hemoglobin: 15.6 g/dL — ABNORMAL HIGH (ref 12.0–15.0)
O2 Saturation: 78 %
Patient temperature: 37
Potassium: 2.3 mmol/L — CL (ref 3.5–5.1)
Sodium: 141 mmol/L (ref 135–145)
TCO2: 24 mmol/L (ref 22–32)
pCO2, Ven: 30.9 mmHg — ABNORMAL LOW (ref 44.0–60.0)
pH, Ven: 7.473 — ABNORMAL HIGH (ref 7.250–7.430)
pO2, Ven: 39 mmHg (ref 32.0–45.0)

## 2021-08-25 LAB — URINALYSIS, ROUTINE W REFLEX MICROSCOPIC
Bilirubin Urine: NEGATIVE
Glucose, UA: 50 mg/dL — AB
Ketones, ur: NEGATIVE mg/dL
Nitrite: NEGATIVE
Protein, ur: NEGATIVE mg/dL
Specific Gravity, Urine: 1.024 (ref 1.005–1.030)
pH: 5 (ref 5.0–8.0)

## 2021-08-25 LAB — LACTIC ACID, PLASMA
Lactic Acid, Venous: 2 mmol/L (ref 0.5–1.9)
Lactic Acid, Venous: 1.9 mmol/L (ref 0.5–1.9)

## 2021-08-25 LAB — AMMONIA: Ammonia: 67 umol/L — ABNORMAL HIGH (ref 9–35)

## 2021-08-25 LAB — MAGNESIUM: Magnesium: 2.3 mg/dL (ref 1.7–2.4)

## 2021-08-25 MED ORDER — IOHEXOL 300 MG/ML  SOLN
100.0000 mL | Freq: Once | INTRAMUSCULAR | Status: AC | PRN
  Administered 2021-08-25: 14:00:00 100 mL via INTRAVENOUS

## 2021-08-25 MED ORDER — POTASSIUM CHLORIDE CRYS ER 20 MEQ PO TBCR: 20.0000 meq | EXTENDED_RELEASE_TABLET | Freq: Two times a day (BID) | ORAL | 0 refills | Status: AC

## 2021-08-25 MED ORDER — POTASSIUM CHLORIDE 10 MEQ/100ML IV SOLN
10.0000 meq | INTRAVENOUS | Status: AC
  Administered 2021-08-25 (×2): 10 meq via INTRAVENOUS

## 2021-08-25 MED ORDER — LACTATED RINGERS IV BOLUS
1000.0000 mL | Freq: Once | INTRAVENOUS | Status: AC
  Administered 2021-08-25: 14:00:00 1000 mL via INTRAVENOUS

## 2021-08-25 MED ORDER — CEFDINIR 300 MG PO CAPS: 300.0000 mg | ORAL_CAPSULE | Freq: Two times a day (BID) | ORAL | 0 refills | Status: DC

## 2021-08-25 MED ORDER — CEFDINIR 300 MG PO CAPS: 300.0000 mg | ORAL_CAPSULE | Freq: Two times a day (BID) | ORAL | 0 refills | Status: AC

## 2021-08-25 MED ORDER — POTASSIUM CHLORIDE CRYS ER 20 MEQ PO TBCR
40.0000 meq | EXTENDED_RELEASE_TABLET | Freq: Once | ORAL | Status: AC
  Administered 2021-08-25: 14:00:00 40 meq via ORAL
  Filled 2021-08-25: qty 2

## 2021-08-25 MED ORDER — POTASSIUM CHLORIDE 10 MEQ/100ML IV SOLN
10.0000 meq | INTRAVENOUS | Status: AC
  Administered 2021-08-25: 14:00:00 10 meq via INTRAVENOUS
  Filled 2021-08-25 (×3): qty 100

## 2021-08-25 MED ORDER — METOCLOPRAMIDE HCL 5 MG/ML IJ SOLN
10.0000 mg | Freq: Once | INTRAMUSCULAR | Status: AC
  Administered 2021-08-25: 14:00:00 10 mg via INTRAVENOUS
  Filled 2021-08-25: qty 2

## 2021-08-25 MED ORDER — SODIUM CHLORIDE 0.9 % IV SOLN
2.0000 g | Freq: Once | INTRAVENOUS | Status: AC
  Administered 2021-08-25: 18:00:00 2 g via INTRAVENOUS
  Filled 2021-08-25: qty 20

## 2021-08-25 NOTE — ED Notes (Signed)
MD is aware and will attempt IV placement via ultrasound. Will continue to monitor.

## 2021-08-25 NOTE — ED Provider Notes (Signed)
[facility] EMERGENCY DEPARTMENT Provider Note   CSN: [id number] Arrival date & time: 08/25/21  0335     History  Chief Complaint  Patient presents with   Hypoglycemia    [name] is a 66 y.o. female.   Hypoglycemia Associated symptoms: no dizziness, no shortness of breath, no speech difficulty, no vomiting and no weakness   Patient presents from home via EMS after being found unresponsive by husband.  EMS found her to be hypoglycemic with blood sugar 48.  D10 was given with normalization of blood sugar and resolution of altered mental status.  Patient does have DM2 and is on insulin.  Additional medical history is notable for HTN, CAD, GERD, obesity, HLD, CKD, gastroparesis, CHF, chronic anemia, and a recent hospitalization.  Her recent hospitalization was for diarrhea that was likely secondary to COVID-19 infection.  She was discharged 4 days ago.  At time of discharge, they changed her insulin.  She reports that she is now on a 7/30 mix and takes 54 units in the mornings and 54 units in the evening.  Per chart review, SNF placement was recommended upon discharge per patient declined and was discharged home.  Since discharge, patient reports continued diarrhea with 2-3 episodes per day.  She has not had nausea or vomiting.  She lives at home with her husband and is able to ambulate with a walker.  Last night, she states that she ate half a burger.  This morning, while in the waiting room, she had crackers.  She does not remember the episode this morning which prompted a call to EMS.  She does remember everything thereafter.  Currently, she endorses mild abdominal pain in the lower aspect of her abdomen.  It is not greater on one side or the other.  She denies any other current symptoms.  Home Medications Prior to Admission medications   Medication Sig Start Date End Date Taking? Authorizing Provider  acetaminophen (TYLENOL) 500 MG tablet Take 500 mg by mouth every  6 (six) hours as needed for moderate pain or headache.   Yes [provider]  aspirin 81 MG EC tablet Take 1 tablet (81 mg total) by mouth daily. 05/23/21  Yes Ladell Pier, MD  atorvastatin (LIPITOR) 80 MG tablet TAKE 1 TABLET BY MOUTH ONCE DAILY IN THE EVENING AT 6  PM Patient taking differently: Take 80 mg by mouth daily. 05/23/21  Yes Ladell Pier, MD  Bismuth Subsalicylate (STOMACH RELIEF PO) Take 1 tablet by mouth 2 (two) times daily as needed (indigestion).   Yes [provider]  Docusate Sodium (STOOL SOFTENER LAXATIVE PO) Take 1 tablet by mouth 2 (two) times daily as needed (constipation).   Yes [provider]  Insulin Lispro Prot & Lispro (HUMALOG MIX 75/25 KWIKPEN [place]) Inject 54 Units into the skin 2 (two) times daily.   Yes [provider]  isosorbide mononitrate (IMDUR) 60 MG 24 hr tablet Take 2 tablets (120 mg total) by mouth daily. Patient taking differently: Take 60 mg by mouth 2 (two) times daily. 05/23/21  Yes Ladell Pier, MD  latanoprost (XALATAN) 0.005 % ophthalmic solution Place 1 drop into both eyes at bedtime.   Yes [provider]  loperamide (IMODIUM) 2 MG capsule Take 1 capsule (2 mg total) by mouth daily as needed for diarrhea or loose stools. 08/21/21  Yes Antonieta Pert, MD  losartan (COZAAR) 100 MG tablet Take 100 mg by mouth daily.   Yes [provider]  metoprolol (TOPROL-XL) 200 MG 24 hr tablet Take 200 mg by mouth daily. # 7 filled on 08-09-2021   Yes [provider]  nitroGLYCERIN (NITROSTAT) 0.4 MG SL tablet DISSOLVE ONE TABLET UNDER THE TONGUE EVERY 5 MINUTES AS NEEDED FOR CHEST PAIN.  DO NOT EXCEED A TOTAL OF 3 DOSES IN 15 MINUTES Patient taking differently: 0.4 mg every 5 (five) minutes as needed for chest pain. 04/14/21  Yes Marianna Payment, MD  Phenir-PE-Sod Sal-Caff Cit (COUGH/COLD MEDICINE PO) Take 1 tablet by mouth every 6 (six) hours as needed (cough).   Yes [provider]   potassium chloride SA (KLOR-CON M) 20 MEQ tablet Take 1 tablet (20 mEq total) by mouth 2 (two) times daily for 3 days. 08/25/21 08/28/21 Yes Godfrey Pick, MD  sertraline (ZOLOFT) 25 MG tablet Take 25 mg by mouth daily.   Yes [provider]  Accu-Chek Softclix Lancets lancets Use as instructed for 3 times daily blood glucose monitoring 12/24/19   Ladell Pier, MD  Blood Glucose Monitoring Suppl (ACCU-CHEK AVIVA PLUS) w/Device KIT Used as directed 09/29/18   Ladell Pier, MD  Blood Pressure Monitoring (BLOOD PRESSURE MONITOR/ARM) DEVI 1 each by Does not apply route daily. ICD 10, I10 and I50.32 03/07/17   Funches, Josalyn, MD  brimonidine-timolol (COMBIGAN) 0.2-0.5 % ophthalmic solution Place 1 drop into both eyes 2 (two) times daily. 04/14/21   Marianna Payment, MD  cefdinir (OMNICEF) 300 MG capsule Take 1 capsule (300 mg total) by mouth 2 (two) times daily for 5 days. 08/25/21 08/30/21  Godfrey Pick, MD  Continuous Blood Gluc Receiver (FREESTYLE LIBRE 14 DAY READER) DEVI Use to check blood sugar three times daily. 08/14/21   Ladell Pier, MD  Continuous Blood Gluc Sensor (FREESTYLE LIBRE 14 DAY SENSOR) MISC Use to check blood sugar three times daily. 08/14/21   Ladell Pier, MD  diltiazem (CARDIZEM CD) 240 MG 24 hr capsule Take 1 capsule (240 mg total) by mouth daily. Patient not taking: Reported on 08/18/2021 05/23/21   Ladell Pier, MD  glucose blood test strip Use TID before meals / Dx E11.9 04/14/21   Marianna Payment, MD  glucose monitoring kit (FREESTYLE) monitoring kit 1 each by Does not apply route 4 (four) times daily - after meals and at bedtime. 09/12/15   Funches, Adriana Mccallum, MD  Insulin Syringe-Needle U-100 (SAFETY INSULIN SYRINGES) 30G X 1/2" 1 ML MISC Use as directed with insulin 05/23/21   Ladell Pier, MD  LANTUS SOLOSTAR 100 UNIT/ML Solostar Pen Inject 20 Units into the skin in the morning and at bedtime. Check sugar 4 times a day at home can increase insulin  based upon your blood sugar  please discuss with your MD, insulin dose cut in hospital- as your oral intake was not well. 08/21/21   Antonieta Pert, MD  Menthol-Methyl Salicylate (MUSCLE RUB) 10-15 % CREA Apply 1 application topically daily as needed (knee pain). Patient not taking: Reported on 08/18/2021 04/14/21   Marianna Payment, MD  RELION INSULIN SYRINGE 31G X 15/64" 1 ML MISC USE AS DIRECTED TWICE DAILY FOR INSULIN 04/30/21   Ladell Pier, MD  diltiazem (DILACOR XR) 240 MG 24 hr capsule Take 1 capsule (240 mg total) by mouth daily. 12/03/20 04/14/21  Shawna Clamp, MD      Allergies    Ativan [lorazepam] and Orange fruit [citrus]    Review of Systems   Review of Systems  Constitutional:  Negative for activity change, appetite change,  chills, fatigue and fever.  Respiratory:  Negative for cough, shortness of breath and wheezing.   Cardiovascular:  Negative for chest pain, palpitations and leg swelling.  Gastrointestinal:  Positive for abdominal pain and diarrhea. Negative for abdominal distention, blood in stool, nausea and vomiting.  Genitourinary:  Negative for dysuria, flank pain and pelvic pain.  Musculoskeletal:  Negative for arthralgias, back pain, gait problem, myalgias and neck pain.  Neurological:  Negative for dizziness, speech difficulty, weakness, light-headedness, numbness and headaches.   Physical Exam Updated Vital Signs BP (!) 154/52    Pulse 82    Temp 98.5 F (36.9 C) (Oral)    Resp 15    LMP 04/02/2012    SpO2 99%  Physical Exam Vitals and nursing note reviewed.  Constitutional:      General: She is not in acute distress.    Appearance: She is well-developed. She is obese. She is not ill-appearing, toxic-appearing or diaphoretic.  HENT:     Head: Normocephalic and atraumatic.     Right Ear: External ear normal.     Left Ear: External ear normal.     Nose: Nose normal.     Mouth/Throat:     Mouth: Mucous membranes are moist.     Pharynx: Oropharynx is clear.   Eyes:     Conjunctiva/sclera: Conjunctivae normal.  Cardiovascular:     Rate and Rhythm: Normal rate and regular rhythm.     Heart sounds: No murmur heard. Pulmonary:     Effort: Pulmonary effort is normal. No respiratory distress.     Breath sounds: Normal breath sounds. No wheezing or rales.  Chest:     Chest wall: No tenderness.  Abdominal:     Palpations: Abdomen is soft.     Tenderness: There is abdominal tenderness (Lower abdomen). There is no guarding or rebound.  Musculoskeletal:        General: No swelling. Normal range of motion.     Cervical back: Neck supple.  Skin:    General: Skin is warm and dry.     Capillary Refill: Capillary refill takes less than 2 seconds.     Coloration: Skin is not jaundiced or pale.  Neurological:     General: No focal deficit present.     Mental Status: She is alert and oriented to person, place, and time.     Cranial Nerves: No cranial nerve deficit.     Sensory: No sensory deficit.     Motor: No weakness.     Coordination: Coordination normal.  Psychiatric:        Mood and Affect: Mood normal.        Behavior: Behavior normal.        Thought Content: Thought content normal.        Judgment: Judgment normal.    ED Results / Procedures / Treatments   Labs (all labs ordered are listed, but only abnormal results are displayed) Labs Reviewed  CBC WITH DIFFERENTIAL/PLATELET - Abnormal; Notable for the following components:      Result Value   WBC 10.6 (*)    Hemoglobin 15.1 (*)    HCT 46.6 (*)    Neutro Abs 7.9 (*)    All other components within normal limits  URINALYSIS, ROUTINE W REFLEX MICROSCOPIC - Abnormal; Notable for the following components:   APPearance CLOUDY (*)    Glucose, UA 50 (*)    Hgb urine dipstick MODERATE (*)    Leukocytes,Ua LARGE (*)    Bacteria, UA MANY (*)  All other components within normal limits  COMPREHENSIVE METABOLIC PANEL - Abnormal; Notable for the following components:   Potassium 2.6 (*)     Creatinine, Ser 1.05 (*)    Albumin 2.8 (*)    AST 55 (*)    GFR, Estimated 59 (*)    All other components within normal limits  AMMONIA - Abnormal; Notable for the following components:   Ammonia 67 (*)    All other components within normal limits  LACTIC ACID, PLASMA - Abnormal; Notable for the following components:   Lactic Acid, Venous 2.0 (*)    All other components within normal limits  CBG MONITORING, ED - Abnormal; Notable for the following components:   Glucose-Capillary 105 (*)    All other components within normal limits  I-STAT VENOUS BLOOD GAS, ED - Abnormal; Notable for the following components:   pH, Ven 7.473 (*)    pCO2, Ven 30.9 (*)    Potassium 2.3 (*)    Calcium, Ion 0.99 (*)    Hemoglobin 15.6 (*)    All other components within normal limits  CBG MONITORING, ED - Abnormal; Notable for the following components:   Glucose-Capillary 217 (*)    All other components within normal limits  RESP PANEL BY RT-PCR (FLU A&B, COVID) ARPGX2  LACTIC ACID, PLASMA  CK  MAGNESIUM  CBG MONITORING, ED  CBG MONITORING, ED  TROPONIN I (HIGH SENSITIVITY)  TROPONIN I (HIGH SENSITIVITY)    EKG None  Radiology DG Chest 2 View  Result Date: 08/25/2021 CLINICAL DATA:  Hypoglycemia, altered mental status EXAM: CHEST - 2 VIEW COMPARISON:  08/17/2021 FINDINGS: Pulmonary insufflation is improved. Minimal left basilar atelectasis. Lungs are otherwise clear. No pneumothorax or pleural effusion. Mild cardiomegaly is stable. Pulmonary vascularity is normal. No acute bone abnormality. IMPRESSION: Stable cardiomegaly. Improved pulmonary insufflation. Electronically Signed   By: Fidela Salisbury M.D.   On: 08/25/2021 05:01   CT HEAD WO CONTRAST (5MM)  Result Date: 08/25/2021 CLINICAL DATA:  Hypoglycemia and agonal breathing EXAM: CT HEAD WITHOUT CONTRAST TECHNIQUE: Contiguous axial images were obtained from the base of the skull through the vertex without intravenous contrast. RADIATION  DOSE REDUCTION: This exam was performed according to the departmental dose-optimization program which includes automated exposure control, adjustment of the mA and/or kV according to patient size and/or use of iterative reconstruction technique. COMPARISON:  Eight days ago FINDINGS: Brain: No evidence of acute infarction, hemorrhage, hydrocephalus, extra-axial collection or mass lesion/mass effect. Cerebral volume loss and periventricular chronic small vessel ischemia. Vascular: No hyperdense vessel or unexpected calcification. Skull: Normal. Negative for fracture or focal lesion. Sinuses/Orbits: Small volume secretions in the maxillary sinuses, also seen previously. Mild mucosal thickening mainly in the ethmoids. Oil tamponade on the right. IMPRESSION: No acute or interval finding. Electronically Signed   By: Jorje Guild M.D.   On: 08/25/2021 04:45   CT ABDOMEN PELVIS W CONTRAST  Result Date: 08/25/2021 CLINICAL DATA:  Acute generalized abdominal pain. EXAM: CT ABDOMEN AND PELVIS WITH CONTRAST TECHNIQUE: Multidetector CT imaging of the abdomen and pelvis was performed using the standard protocol following bolus administration of intravenous contrast. RADIATION DOSE REDUCTION: This exam was performed according to the departmental dose-optimization program which includes automated exposure control, adjustment of the mA and/or kV according to patient size and/or use of iterative reconstruction technique. CONTRAST:  143m OMNIPAQUE IOHEXOL 300 MG/ML  SOLN COMPARISON:  August 17, 2021. FINDINGS: Lower chest: No acute abnormality. Hepatobiliary: No focal liver abnormality is seen. No gallstones, gallbladder  wall thickening, or biliary dilatation. Pancreas: Unremarkable. No pancreatic ductal dilatation or surrounding inflammatory changes. Spleen: Normal in size without focal abnormality. Adrenals/Urinary Tract: Adrenal glands are unremarkable. Kidneys are normal, without renal calculi, focal lesion, or  hydronephrosis. Bladder is unremarkable. Stomach/Bowel: The stomach appears normal. There is no evidence of bowel obstruction or inflammation. Vascular/Lymphatic: Aortic atherosclerosis. No enlarged abdominal or pelvic lymph nodes. Reproductive: Multiple calcified uterine fibroids are noted. 11.0 x 5.3 cm complex multi-septated left left adnexal mass is noted highly concerning for cystic ovarian neoplasm. Other: No abdominal wall hernia or abnormality. No abdominopelvic ascites. Musculoskeletal: No acute or significant osseous findings. 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 Further evaluation with MRI with and without gadolinium administration is recommended as well as consultation with gynecologic surgery. Multiple calcified uterine fibroids are noted. Aortic Atherosclerosis (ICD10-I70.0). Electronically Signed   By: Marijo Conception M.D.   On: 08/25/2021 14:52    Procedures Procedures    Medications Ordered in ED Medications  potassium chloride 10 mEq in 100 mL IVPB (0 mEq Intravenous Stopped 08/25/21 1459)  potassium chloride SA (KLOR-CON M) CR tablet 40 mEq (40 mEq Oral Given 08/25/21 1349)  lactated ringers bolus 1,000 mL (0 mLs Intravenous Stopped 08/25/21 1740)  metoCLOPramide (REGLAN) injection 10 mg (10 mg Intravenous Given 08/25/21 1348)  potassium chloride 10 mEq in 100 mL IVPB (0 mEq Intravenous Stopped 08/25/21 1739)  iohexol (OMNIPAQUE) 300 MG/ML solution 100 mL (100 mLs Intravenous Contrast Given 08/25/21 1422)  cefTRIAXone (ROCEPHIN) 2 g in sodium chloride 0.9 % 100 mL IVPB (0 g Intravenous Stopped 08/25/21 1807)    ED Course/ Medical Decision Making/ A&P                           Medical Decision Making Amount and/or Complexity of Data Reviewed Labs: ordered. Radiology: ordered.  Risk Prescription drug management.   This patient presents to the ED for concern of loss of  consciousness, this involves an extensive number of treatment options, and is a complaint that carries with it a high risk of complications and morbidity.  The differential diagnosis includes hypoglycemia, syncope, polypharmacy, trauma, dehydration    Co morbidities that complicate the patient evaluation  DM2, chronic anemia, HTN, GERD, CAD, severe obesity, HLD, CKD, CHF, left ovarian mass, and recent hospitalization   Additional history obtained:  Additional history obtained from N/A External records from outside source obtained and reviewed including EMR   Lab Tests:  I Ordered, and personally interpreted labs.  The pertinent results include: Mild leukocytosis, hemoconcentration, hypokalemia, and evidence of UTI   Imaging Studies ordered:  I ordered imaging studies including CT of abdomen pelvis I independently visualized and interpreted imaging which showed redemonstration of a left ovarian mass, concerning for neoplasm I agree with the radiologist interpretation   Cardiac Monitoring:  The patient was maintained on a cardiac monitor.  I personally viewed and interpreted the cardiac monitored which showed an underlying rhythm of: Sinus rhythm   Medicines ordered and prescription drug management:  I ordered medication including IV fluids for dehydration; p.o. and IV potassium for electrolyte replacement; ceftriaxone for treatment of UTI Reevaluation of the patient after these medicines showed that the patient improved I have reviewed the patients home medicines and have made adjustments as needed    Critical Interventions:  Potassium replacement for hypokalemia   Consultations Obtained:  I requested consultation with the gynecology,  and discussed lab and imaging findings as well as pertinent plan - they recommend: Outpatient follow-up for left ovarian mass.  Next step in work-up will likely be a ultrasound to compare from ultrasound findings from last May.  They do  state that they will reach out to the patient to arrange follow-up.     Problem List / ED Course:  66 year old female presenting for altered mental status at home, found to be hypoglycemic on scene.  D10 was given by EMS and patient had resolution of symptoms.  Prior to being bedded in the ED, she was able to undergo initial diagnostic work-up.  Results of this work-up notable for hypokalemia.  Of note, patient presented to the ED 6 days ago and was also hypokalemic from diarrhea that was attributed to COVID-19 infection.  Patient states that she has had continued diarrhea since she left the hospital.  She estimates that she has 2-3 episodes per day.  P.o. and IV potassium replacement was ordered.  On exam, patient has lower abdominal pain and tenderness.  This is not greater on any 1 side or the other.  CT scan of abdomen and pelvis was ordered.  Patient also has evidence of hemoconcentration on CBC.  This is consistent with what she describes as persistent diarrhea with decreased p.o. intake.  IV fluids were given.  CT scan showed a redemonstration of a left ovarian mass.  This mass has increased in size since prior imaging studies.  It is concerning for possible neoplasm.  I discussed this with patient who states that she has been told in the past that she is not a surgical candidate.  On chart review, this conversation likely occurred 12 years ago.  At that time, plan was for every 6 month surveillance.  It does appear that she has been lost to follow-up for many years.  She did undergo a ultrasound of her pelvic area in May of last year.  Mass was also shown on CT scan from her last hospital admission 6 days ago.  I contacted gynecology for recommendations.  They stated that patient can be worked up for this outpatient.  Next step would likely be a ultrasound to compare to ultrasound from May.  Patient has apprehension about following up with gynecology.  I did express the importance of doing so.  While in  the ED, patient continued to rest comfortably on reassessments.  She was able to eat and drink without difficulty.  She had no further episodes of diarrhea during her 15-hour observation in the ED.  Her blood sugar remained normal on repeat CBG checks.  I suspect her episode of hypoglycemia was secondary to changes in her insulin dosing upon discharge 2 days ago.  Patient has also had decreased p.o. intake.  Patient would benefit from diabetes education.  Urinalysis showed evidence of urinary infection.  Patient denies any recent symptoms of dysuria.  She does, however, have a new leukocytosis and suprapubic discomfort and tenderness.  2 g of ceftriaxone was given.  I had multiple discussions with the patient regarding best disposition for today.  On her discharge 2 days ago SNF placement was recommended.  Patient declined this in favor of going home.  Today, she is also highly motivated to go home.  She was offered hospital admission due to her multiple chronic morbidities, concern for recurrence of hypokalemia, as well as her UTI.  Patient declined hospitalization and felt that she would be okay  going home.  She states that she has home health that is set up to visit her 2 times per week.  Ambulatory referrals were ordered for diabetes education as well as gynecology follow-up.  Patient was prescribed cefdinir which she states her husband will be able to get from the pharmacy for her.  She was also prescribed 3 days of continued potassium replacement.  She was encouraged to return to the ED at any time if she changes her mind or has any further concerning symptoms.    Reevaluation:  After the interventions noted above, I reevaluated the patient and found that they have :improved   Social Determinants of Health:  Patient has many chronic comorbidities.  She appears to have poor insight into her current medical conditions.  She likely has poor health literacy.  I do feel that she would benefit from skilled  nursing facility but she again refuses this.   Dispostion:  After consideration of the diagnostic results and the patients response to treatment, I feel that the patent would benefit from hospital admission and skilled nursing facility placement, however, patient declines both of these.          Final Clinical Impression(s) / ED Diagnoses Final diagnoses:  Hypoglycemia    Rx / DC Orders ED Discharge Orders          Ordered    cefdinir (OMNICEF) 300 MG capsule  2 times daily,   Status:  Discontinued        08/25/21 1749    potassium chloride SA (KLOR-CON M) 20 MEQ tablet  2 times daily        08/25/21 1750    cefdinir (OMNICEF) 300 MG capsule  2 times daily        08/25/21 1751    Ambulatory referral to Endocrinology  Status:  Canceled        08/25/21 1753    Ambulatory Referral to DSME/T        08/25/21 1754    Ambulatory referral to Gynecologic Oncology        08/25/21 1918              Godfrey Pick, MD 08/25/21 1929

## 2021-08-25 NOTE — ED Notes (Addendum)
Dr.Dixon at bedside attempting IV ultrasound. Will continue to monitor.

## 2021-08-25 NOTE — ED Notes (Signed)
IV team at bedside 

## 2021-08-25 NOTE — ED Notes (Addendum)
Pt requesting husband to be contacted. Spoke with [name] husband and made aware of pt being d/c waiting for transport

## 2021-08-25 NOTE — ED Notes (Signed)
IV team attempted IV placement with no success. Will notify Dr.Dixon. Will continue to monitor.

## 2021-08-25 NOTE — ED Triage Notes (Signed)
Pt here via [facility] for hypoglycemia. Pt was found unresponsive by husband, pt was agonal breathing when ems arrived. Cbg 48, ems gave d10, cbg now 218, pt now aox4. Per husband this is pt's baseline after having a "hypoglycemic episode"

## 2021-08-25 NOTE — ED Provider Triage Note (Signed)
Emergency Medicine Provider Triage Evaluation Note  [name] , Burns 66 y.o. female  was evaluated in triage.  Found by family altered, decreased breathing. Unknown LKN. With EMS found to be hypoglycemic to 48, given D10 with improvement in mentation. Current mentation is Baseline per EMS?   DcC3 days ago for COVID, diarrhea, AKI, elevated trop Recently dc from [facility] here, per family still with diarrhea, not eating as much  Previous admissions for AMS Review of Systems  Positive: AMS, cough, diarrhea Negative:   Physical Exam  LMP 04/02/2012  Gen:   Awake, no distress   Resp:  Normal effort  MSK:   Moves extremities without difficulty  Neuro:  No facial droop, equal strength Bl, Alert to person, place however not time, sleepy Other:    Medical Decision Making  Medically screening exam initiated at 3:38 AM.  Appropriate orders placed.  [name] was informed that the remainder of the evaluation will be completed by another provider, this initial triage assessment does not replace that evaluation, and the importance of remaining in the ED until their evaluation is complete.  AMS   [name] A, PA-C 08/25/21 0357

## 2021-08-25 NOTE — ED Notes (Signed)
Taken to CT by transporter.

## 2021-08-25 NOTE — ED Notes (Signed)
PT[facility] called; 13th on the list at 6:21PM.

## 2021-08-25 NOTE — Progress Notes (Signed)
Assessed both arms, not able to locate an appropriate vein at this time with [name] suitable for potassium infusion. Notified RN.

## 2021-08-25 NOTE — Discharge Instructions (Addendum)
You have several issues currently:  Your urine appears infected.  You received your first dose of antibiotics in the emergency department.  This will cover you for the next 24 hours.  You will need to take continued antibiotics at home.  There is a prescription for an antibiotic called cefdinir that was sent to your pharmacy.  Take this as prescribed.  When taking insulin, you are at risk for low blood sugar.  This can be a life-threatening condition.  Ensure that you eat full meals throughout the day and before bed to avoid low blood sugar.  Check your blood sugar at home periodically to ensure normal blood sugar levels.  Your potassium was low today, as it was the last time you were in the emergency department.  We have replaced your potassium today.  If you have continued diarrhea, you are at risk for low potassium again.  There is a prescription for potassium pills for you to take over the next 3 days.  This was also sent to your pharmacy.  Take as prescribed.  You have a mass on the area of your left ovary.  This is concerning for possible cancer.  You will need to follow-up with a gynecologist for further testing.  They should be contacting you.  There is also a telephone number below to call to arrange this follow-up.  If you experience any new or worsening symptoms, or if you change your mind about getting admitted to the hospital, please return to the emergency department.

## 2021-08-25 NOTE — ED Notes (Signed)
Dr.Dixon is aware that pt needs ultrasound IV. Ultrasound at bedside.

## 2021-08-25 NOTE — ED Notes (Signed)
Waiting for IV team to place IV and blood draw. Pt is a difficult stick with specific orders.

## 2021-08-25 NOTE — ED Notes (Signed)
Attempted IV with no success. IV team consult placed.  

## 2021-08-25 NOTE — ED Notes (Signed)
Verified with spouse that he is home to let PTAR in to home.

## 2021-08-25 NOTE — ED Notes (Signed)
Received verbal report Katrina Y RN at this time

## 2021-08-28 DIAGNOSIS — R404 Transient alteration of awareness: Secondary | ICD-10-CM | POA: Diagnosis not present

## 2021-08-28 DIAGNOSIS — R231 Pallor: Secondary | ICD-10-CM | POA: Diagnosis not present

## 2021-08-28 DIAGNOSIS — E161 Other hypoglycemia: Secondary | ICD-10-CM | POA: Diagnosis not present

## 2021-08-28 DIAGNOSIS — R4020 Unspecified coma: Secondary | ICD-10-CM | POA: Diagnosis not present

## 2021-08-28 DIAGNOSIS — E162 Hypoglycemia, unspecified: Secondary | ICD-10-CM | POA: Diagnosis not present

## 2021-09-02 ENCOUNTER — Telehealth: Payer: Self-pay | Admitting: *Deleted

## 2021-09-02 NOTE — Telephone Encounter (Signed)
Attempted to reach the patient to schedule a new patient appt, LMOM to call the office back  

## 2021-09-03 NOTE — Telephone Encounter (Signed)
Attempted to reach the patent with no answer, LMOM to call the office back

## 2021-09-04 NOTE — Telephone Encounter (Signed)
Attempted to reach the patient with no answer; LMOM to call the office back

## 2021-09-07 NOTE — Telephone Encounter (Addendum)
Attempted to reach the patient with no answer, LMOM to call the office back. Message sent to ER physician (referral MD)

## 2021-09-07 NOTE — Telephone Encounter (Incomplete Revision)
Attempted to reach the patient with no answer, LMOM to call the office back. Message sent to ER physician (referral MD)

## 2021-09-23 ENCOUNTER — Ambulatory Visit: Payer: Medicare Other | Admitting: Obstetrics & Gynecology

## 2021-09-29 ENCOUNTER — Emergency Department (HOSPITAL_COMMUNITY): Payer: Medicare Other

## 2021-09-29 ENCOUNTER — Emergency Department (HOSPITAL_COMMUNITY)
Admission: EM | Admit: 2021-09-29 | Discharge: 2021-09-30 | Disposition: E | Payer: Medicare Other | Attending: Emergency Medicine | Admitting: Emergency Medicine

## 2021-09-29 DIAGNOSIS — I2510 Atherosclerotic heart disease of native coronary artery without angina pectoris: Secondary | ICD-10-CM | POA: Insufficient documentation

## 2021-09-29 DIAGNOSIS — I130 Hypertensive heart and chronic kidney disease with heart failure and stage 1 through stage 4 chronic kidney disease, or unspecified chronic kidney disease: Secondary | ICD-10-CM | POA: Diagnosis not present

## 2021-09-29 DIAGNOSIS — R739 Hyperglycemia, unspecified: Secondary | ICD-10-CM | POA: Diagnosis not present

## 2021-09-29 DIAGNOSIS — R579 Shock, unspecified: Secondary | ICD-10-CM | POA: Diagnosis not present

## 2021-09-29 DIAGNOSIS — I472 Ventricular tachycardia, unspecified: Secondary | ICD-10-CM | POA: Diagnosis not present

## 2021-09-29 DIAGNOSIS — R4189 Other symptoms and signs involving cognitive functions and awareness: Secondary | ICD-10-CM

## 2021-09-29 DIAGNOSIS — R400 Somnolence: Secondary | ICD-10-CM | POA: Insufficient documentation

## 2021-09-29 DIAGNOSIS — Z743 Need for continuous supervision: Secondary | ICD-10-CM | POA: Diagnosis not present

## 2021-09-29 DIAGNOSIS — R4020 Unspecified coma: Secondary | ICD-10-CM | POA: Diagnosis not present

## 2021-09-29 DIAGNOSIS — E872 Acidosis, unspecified: Secondary | ICD-10-CM | POA: Diagnosis not present

## 2021-09-29 DIAGNOSIS — Z8616 Personal history of COVID-19: Secondary | ICD-10-CM | POA: Diagnosis not present

## 2021-09-29 DIAGNOSIS — E875 Hyperkalemia: Secondary | ICD-10-CM

## 2021-09-29 DIAGNOSIS — Z4682 Encounter for fitting and adjustment of non-vascular catheter: Secondary | ICD-10-CM | POA: Diagnosis not present

## 2021-09-29 DIAGNOSIS — R404 Transient alteration of awareness: Secondary | ICD-10-CM | POA: Diagnosis not present

## 2021-09-29 DIAGNOSIS — N183 Chronic kidney disease, stage 3 unspecified: Secondary | ICD-10-CM | POA: Diagnosis not present

## 2021-09-29 DIAGNOSIS — I5032 Chronic diastolic (congestive) heart failure: Secondary | ICD-10-CM | POA: Diagnosis not present

## 2021-09-29 DIAGNOSIS — J984 Other disorders of lung: Secondary | ICD-10-CM | POA: Diagnosis not present

## 2021-09-29 DIAGNOSIS — R6889 Other general symptoms and signs: Secondary | ICD-10-CM | POA: Diagnosis not present

## 2021-09-29 LAB — CBC WITH DIFFERENTIAL/PLATELET
Abs Immature Granulocytes: 0 10*3/uL (ref 0.00–0.07)
Basophils Absolute: 0 10*3/uL (ref 0.0–0.1)
Basophils Relative: 0 %
Eosinophils Absolute: 0 10*3/uL (ref 0.0–0.5)
Eosinophils Relative: 0 %
HCT: 45.8 % (ref 36.0–46.0)
Hemoglobin: 13.2 g/dL (ref 12.0–15.0)
Lymphocytes Relative: 6 %
Lymphs Abs: 0.6 10*3/uL — ABNORMAL LOW (ref 0.7–4.0)
MCH: 30.5 pg (ref 26.0–34.0)
MCHC: 28.8 g/dL — ABNORMAL LOW (ref 30.0–36.0)
MCV: 105.8 fL — ABNORMAL HIGH (ref 80.0–100.0)
Monocytes Absolute: 0.2 10*3/uL (ref 0.1–1.0)
Monocytes Relative: 2 %
Neutro Abs: 9.2 10*3/uL — ABNORMAL HIGH (ref 1.7–7.7)
Neutrophils Relative %: 92 %
Platelets: 168 10*3/uL (ref 150–400)
RBC: 4.33 MIL/uL (ref 3.87–5.11)
RDW: 16 % — ABNORMAL HIGH (ref 11.5–15.5)
WBC: 10 10*3/uL (ref 4.0–10.5)
nRBC: 0 % (ref 0.0–0.2)
nRBC: 0 /100 WBC

## 2021-09-29 LAB — BLOOD CULTURE ID PANEL (REFLEXED) - BCID2

## 2021-09-29 LAB — URINALYSIS, ROUTINE W REFLEX MICROSCOPIC
Bilirubin Urine: NEGATIVE
Glucose, UA: >=500 mg/dL — AB
Ketones, ur: 5 mg/dL — AB
Nitrite: NEGATIVE
Protein, ur: 100 mg/dL — AB
RBC / HPF: >50 RBC/hpf — ABNORMAL HIGH (ref 0–5)
Specific Gravity, Urine: 1.02 (ref 1.005–1.030)
WBC, UA: >50 WBC/hpf — ABNORMAL HIGH (ref 0–5)
pH: 5 (ref 5.0–8.0)

## 2021-09-29 LAB — I-STAT CHEM 8, ED
BUN: 101 mg/dL — ABNORMAL HIGH (ref 8–23)
Calcium, Ion: 1.37 mmol/L (ref 1.15–1.40)
Chloride: 119 mmol/L — ABNORMAL HIGH (ref 98–111)
Creatinine, Ser: 4.5 mg/dL — ABNORMAL HIGH (ref 0.44–1.00)
Glucose, Bld: >700 mg/dL (ref 70–99)
HCT: 39 % (ref 36.0–46.0)
Hemoglobin: 13.3 g/dL (ref 12.0–15.0)
Potassium: 6 mmol/L — ABNORMAL HIGH (ref 3.5–5.1)
Sodium: 151 mmol/L — ABNORMAL HIGH (ref 135–145)
TCO2: 15 mmol/L — ABNORMAL LOW (ref 22–32)

## 2021-09-29 LAB — I-STAT VENOUS BLOOD GAS, ED
Acid-base deficit: 15 mmol/L — ABNORMAL HIGH (ref 0.0–2.0)
Bicarbonate: 12.2 mmol/L — ABNORMAL LOW (ref 20.0–28.0)
Calcium, Ion: 1.08 mmol/L — ABNORMAL LOW (ref 1.15–1.40)
HCT: 39 % (ref 36.0–46.0)
Hemoglobin: 13.3 g/dL (ref 12.0–15.0)
O2 Saturation: 83 %
Potassium: 7.3 mmol/L (ref 3.5–5.1)
Sodium: 151 mmol/L — ABNORMAL HIGH (ref 135–145)
TCO2: 13 mmol/L — ABNORMAL LOW (ref 22–32)
pCO2, Ven: 33.6 mmHg — ABNORMAL LOW (ref 44–60)
pH, Ven: 7.167 — CL (ref 7.25–7.43)
pO2, Ven: 59 mmHg — ABNORMAL HIGH (ref 32–45)

## 2021-09-29 LAB — COMPREHENSIVE METABOLIC PANEL
ALT: 148 U/L — ABNORMAL HIGH (ref 0–44)
AST: 952 U/L — ABNORMAL HIGH (ref 15–41)
Albumin: 1.8 g/dL — ABNORMAL LOW (ref 3.5–5.0)
Alkaline Phosphatase: 80 U/L (ref 38–126)
Anion gap: 28 — ABNORMAL HIGH (ref 5–15)
BUN: 97 mg/dL — ABNORMAL HIGH (ref 8–23)
CO2: 10 mmol/L — ABNORMAL LOW (ref 22–32)
Calcium: 10.5 mg/dL — ABNORMAL HIGH (ref 8.9–10.3)
Chloride: 113 mmol/L — ABNORMAL HIGH (ref 98–111)
Creatinine, Ser: 4.85 mg/dL — ABNORMAL HIGH (ref 0.44–1.00)
GFR, Estimated: 9 mL/min — ABNORMAL LOW (ref >60)
Glucose, Bld: 1013 mg/dL (ref 70–99)
Potassium: 6.4 mmol/L (ref 3.5–5.1)
Sodium: 151 mmol/L — ABNORMAL HIGH (ref 135–145)
Total Bilirubin: 1.1 mg/dL (ref 0.3–1.2)
Total Protein: 5.6 g/dL — ABNORMAL LOW (ref 6.5–8.1)

## 2021-09-29 LAB — I-STAT ARTERIAL BLOOD GAS, ED
Acid-base deficit: 12 mmol/L — ABNORMAL HIGH (ref 0.0–2.0)
Bicarbonate: 16.5 mmol/L — ABNORMAL LOW (ref 20.0–28.0)
Calcium, Ion: 1.36 mmol/L (ref 1.15–1.40)
HCT: 35 % — ABNORMAL LOW (ref 36.0–46.0)
Hemoglobin: 11.9 g/dL — ABNORMAL LOW (ref 12.0–15.0)
O2 Saturation: 100 %
Patient temperature: 97.8
Potassium: 5.1 mmol/L (ref 3.5–5.1)
Sodium: 156 mmol/L — ABNORMAL HIGH (ref 135–145)
TCO2: 18 mmol/L — ABNORMAL LOW (ref 22–32)
pCO2 arterial: 43.7 mmHg (ref 32–48)
pH, Arterial: 7.183 — CL (ref 7.35–7.45)
pO2, Arterial: 243 mmHg — ABNORMAL HIGH (ref 83–108)

## 2021-09-29 LAB — PROTIME-INR
INR: 1.5 — ABNORMAL HIGH (ref 0.8–1.2)
Prothrombin Time: 18.2 seconds — ABNORMAL HIGH (ref 11.4–15.2)

## 2021-09-29 LAB — AMMONIA: Ammonia: 40 umol/L — ABNORMAL HIGH (ref 9–35)

## 2021-09-29 LAB — LACTIC ACID, PLASMA
Lactic Acid, Venous: >9 mmol/L (ref 0.5–1.9)
Lactic Acid, Venous: >9 mmol/L (ref 0.5–1.9)

## 2021-09-29 LAB — TROPONIN I (HIGH SENSITIVITY): Troponin I (High Sensitivity): 2245 ng/L (ref <18)

## 2021-09-29 LAB — RAPID URINE DRUG SCREEN, HOSP PERFORMED
Amphetamines: NOT DETECTED
Barbiturates: NOT DETECTED
Benzodiazepines: NOT DETECTED
Cocaine: NOT DETECTED
Opiates: NOT DETECTED
Tetrahydrocannabinol: NOT DETECTED

## 2021-09-29 LAB — CBG MONITORING, ED
Glucose-Capillary: >600 mg/dL (ref 70–99)
Glucose-Capillary: >600 mg/dL (ref 70–99)
Glucose-Capillary: >600 mg/dL (ref 70–99)

## 2021-09-29 LAB — CK: Total CK: 672 U/L — ABNORMAL HIGH (ref 38–234)

## 2021-09-29 LAB — ETHANOL: Alcohol, Ethyl (B): <10 mg/dL (ref <10)

## 2021-09-29 MED ORDER — DEXTROSE 50 % IV SOLN: 0.0000 mL | INTRAVENOUS | Status: DC | PRN

## 2021-09-29 MED ORDER — NOREPINEPHRINE 4 MG/250ML-% IV SOLN
INTRAVENOUS | Status: AC
  Filled 2021-09-29: qty 250

## 2021-09-29 MED ORDER — INSULIN REGULAR(HUMAN) IN NACL 100-0.9 UT/100ML-% IV SOLN
INTRAVENOUS | Status: DC
  Administered 2021-09-29: 5 [IU]/h via INTRAVENOUS

## 2021-09-29 MED ORDER — DEXTROSE IN LACTATED RINGERS 5 % IV SOLN: INTRAVENOUS | Status: DC

## 2021-09-29 MED ORDER — PHENYLEPHRINE 40 MCG/ML (10ML) SYRINGE FOR IV PUSH (FOR BLOOD PRESSURE SUPPORT)
PREFILLED_SYRINGE | INTRAVENOUS | Status: AC | PRN
  Administered 2021-09-29 (×2): 100 ug via INTRAVENOUS

## 2021-09-29 MED ORDER — VASOPRESSIN 20 UNITS/100 ML INFUSION FOR SHOCK
0.0000 [IU]/min | INTRAVENOUS | Status: DC
  Administered 2021-09-29: 0.03 [IU]/min via INTRAVENOUS

## 2021-09-29 MED ORDER — INSULIN REGULAR(HUMAN) IN NACL 100-0.9 UT/100ML-% IV SOLN
INTRAVENOUS | Status: DC
  Filled 2021-09-29: qty 100

## 2021-09-29 MED ORDER — SODIUM BICARBONATE 8.4 % IV SOLN
INTRAVENOUS | Status: AC | PRN
  Administered 2021-09-29: 100 meq via INTRAVENOUS

## 2021-09-29 MED ORDER — LACTATED RINGERS IV SOLN: INTRAVENOUS | Status: DC

## 2021-09-29 MED ORDER — ETOMIDATE 2 MG/ML IV SOLN
INTRAVENOUS | Status: AC | PRN
  Administered 2021-09-29: 20 mg via INTRAVENOUS

## 2021-09-29 MED ORDER — VANCOMYCIN HCL 2000 MG/400ML IV SOLN
2000.0000 mg | Freq: Once | INTRAVENOUS | Status: AC
  Administered 2021-09-29: 2000 mg via INTRAVENOUS
  Filled 2021-09-29: qty 400

## 2021-09-29 MED ORDER — EPINEPHRINE HCL 5 MG/250ML IV SOLN IN NS
0.5000 ug/min | INTRAVENOUS | Status: DC
  Administered 2021-09-29: 10 ug/min via INTRAVENOUS

## 2021-09-29 MED ORDER — NOREPINEPHRINE 16 MG/250ML-% IV SOLN
0.0000 ug/min | INTRAVENOUS | Status: DC
  Administered 2021-09-29: 100 ug/min via INTRAVENOUS
  Filled 2021-09-29 (×2): qty 250

## 2021-09-29 MED ORDER — EPINEPHRINE 1 MG/10ML IJ SOSY
PREFILLED_SYRINGE | INTRAMUSCULAR | Status: AC | PRN
Start: 2021-09-29 — End: 2021-09-29
  Administered 2021-09-29 (×2): 1 mg via INTRAVENOUS

## 2021-09-29 MED ORDER — STERILE WATER FOR INJECTION IV SOLN
INTRAVENOUS | Status: DC
  Filled 2021-09-29: qty 1000

## 2021-09-29 MED ORDER — SODIUM CHLORIDE 0.9 % IV SOLN
2.0000 g | Freq: Once | INTRAVENOUS | Status: AC
  Administered 2021-09-29: 2 g via INTRAVENOUS
  Filled 2021-09-29: qty 2

## 2021-09-29 MED ORDER — NOREPINEPHRINE 4 MG/250ML-% IV SOLN
0.0000 ug/min | INTRAVENOUS | Status: DC
  Administered 2021-09-29: 10 ug/min via INTRAVENOUS
  Administered 2021-09-29: 100 ug/min via INTRAVENOUS

## 2021-09-29 MED ORDER — EPINEPHRINE 1 MG/10ML IJ SOSY
PREFILLED_SYRINGE | INTRAMUSCULAR | Status: AC | PRN
  Administered 2021-09-29 (×2): 1 mg via INTRAVENOUS

## 2021-09-29 MED ORDER — FENTANYL 2500MCG IN NS 250ML (10MCG/ML) PREMIX INFUSION
100.0000 ug/h | INTRAVENOUS | Status: DC
Start: 2021-09-29 — End: 2021-09-29
  Administered 2021-09-29: 100 ug/h via INTRAVENOUS
  Filled 2021-09-29: qty 250

## 2021-09-29 MED ORDER — LACTATED RINGERS IV BOLUS: 2360.0000 mL | Freq: Once | INTRAVENOUS | Status: DC

## 2021-09-29 MED ORDER — LIDOCAINE HCL (CARDIAC) PF 100 MG/5ML IV SOSY
PREFILLED_SYRINGE | INTRAVENOUS | Status: AC | PRN
  Administered 2021-09-29: 100 mg via INTRAVENOUS

## 2021-09-29 MED ORDER — SODIUM BICARBONATE 8.4 % IV SOLN
INTRAVENOUS | Status: AC | PRN
  Administered 2021-09-29 (×2): 100 meq via INTRAVENOUS

## 2021-09-29 MED ORDER — LACTATED RINGERS IV BOLUS: 1000.0000 mL | INTRAVENOUS | Status: AC

## 2021-09-29 MED ORDER — CALCIUM CHLORIDE 10 % IV SOLN
INTRAVENOUS | Status: AC | PRN
  Administered 2021-09-29: 1 g via INTRAVENOUS

## 2021-09-29 MED ORDER — CALCIUM CHLORIDE 10 % IV SOLN
INTRAVENOUS | Status: AC | PRN
Start: 2021-09-29 — End: 2021-09-29
  Administered 2021-09-29: 1 g via INTRAVENOUS

## 2021-09-29 MED ORDER — AMIODARONE HCL 150 MG/3ML IV SOLN
INTRAVENOUS | Status: AC | PRN
  Administered 2021-09-29: 150 mg via INTRAVENOUS

## 2021-09-29 MED ORDER — AMIODARONE LOAD VIA INFUSION
150.0000 mg | Freq: Once | INTRAVENOUS | Status: AC
  Administered 2021-09-29: 150 mg via INTRAVENOUS
  Filled 2021-09-29: qty 83.34

## 2021-09-29 MED ORDER — AMIODARONE HCL IN DEXTROSE 360-4.14 MG/200ML-% IV SOLN
60.0000 mg/h | INTRAVENOUS | Status: DC
  Administered 2021-09-29: 60 mg/h via INTRAVENOUS
  Filled 2021-09-29: qty 200

## 2021-09-29 MED ORDER — EPINEPHRINE HCL 5 MG/250ML IV SOLN IN NS
INTRAVENOUS | Status: AC
  Filled 2021-09-29: qty 250

## 2021-09-29 MED ORDER — EPINEPHRINE 1 MG/10ML IJ SOSY
PREFILLED_SYRINGE | INTRAMUSCULAR | Status: AC | PRN
Start: 2021-09-29 — End: 2021-09-29
  Administered 2021-09-29: 1 mg via INTRAVENOUS

## 2021-09-29 MED ORDER — ROCURONIUM BROMIDE 50 MG/5ML IV SOLN
INTRAVENOUS | Status: AC | PRN
Start: 2021-09-29 — End: 2021-09-29
  Administered 2021-09-29: 100 mg via INTRAVENOUS

## 2021-09-29 MED ORDER — INSULIN ASPART 100 UNIT/ML IJ SOLN
5.0000 [IU] | Freq: Once | INTRAMUSCULAR | Status: AC
  Administered 2021-09-29: 5 [IU] via INTRAVENOUS

## 2021-09-29 MED ORDER — LACTATED RINGERS IV BOLUS
1000.0000 mL | Freq: Once | INTRAVENOUS | Status: AC
  Administered 2021-09-29: 1000 mL via INTRAVENOUS

## 2021-09-29 MED ORDER — EPINEPHRINE 1 MG/10ML IJ SOSY
PREFILLED_SYRINGE | INTRAMUSCULAR | Status: AC | PRN
  Administered 2021-09-29: 1 mg via INTRAVENOUS

## 2021-09-29 MED ORDER — VASOPRESSIN 20 UNITS/100 ML INFUSION FOR SHOCK
INTRAVENOUS | Status: AC
  Filled 2021-09-29: qty 100

## 2021-09-29 MED ORDER — AMIODARONE HCL IN DEXTROSE 360-4.14 MG/200ML-% IV SOLN
30.0000 mg/h | INTRAVENOUS | Status: DC
Start: 2021-09-29 — End: 2021-09-29

## 2021-09-30 NOTE — Code Documentation (Signed)
NG placed. Pt went into PEA arrest CPR started.

## 2021-09-30 NOTE — ED Notes (Signed)
Pt brady down and went into PEA arrest. Levo changed to 58mcg, fent changed to 50 mcg, verbal order per Mesner MD

## 2021-09-30 NOTE — ED Notes (Signed)
Vasopressin staring at 58mL/hr, 0.03units/min. Verbal order per Mesner MD

## 2021-09-30 NOTE — ED Provider Notes (Addendum)
Emergency Department Provider Note   I have reviewed the triage vital signs and the nursing notes.   HISTORY  Chief Complaint Unresponsive   HPI [name] is a 66 y.o. female with multiple medical problems as documented below who presents to the ED unresponsive. Unclear history from husband and also EMS. Sounds like she has had decreased intake and not feelign well for a few days. No specific pain or illness. No fever or cough. Tonight patient apparently became unresponsive (or possibly decreased responsiveness) 8-10 hours prior to EMS being called. On tehir arrival she was unresponsive, hypotensive. IO started. Fluids started brought here for further eval.   Attempted to contact husband multiple times in the first hour and did not answer. Ultimately secretary did get a hold of him. He did not appear to understand the gravity of the situation and would try to come in to see. I also discussed with other person on the record who understands the gravity of the situation.   Past Medical History:  Diagnosis Date   Blind right eye    Chronic back pain    "my whole back" (03/18/2015)   Chronic chest pain    takes isosorbide   Chronic diastolic heart failure ([place]) 9/13   echo 03/20/15 LV Ef of 60-65%, grade 2DD and  PA peak pressure: 36 mm Hg    CKD (chronic kidney disease), stage III (HCC)    Coronary artery disease    Dr Sallyanne Kuster, cardiac cath 02-07-2009 -- diffuse 3V CAD involving RCA, CFx, and D1,  pt not a candidate for bypass surgery or angioplasty,  medical management   Diabetic neuropathy ([place])    Diabetic retinopathy ([place])    bilateral proliferative   Diastolic CHF, chronic ([place])    Dyspnea    "always"   Edema of both lower extremities    Generalized weakness    GERD (gastroesophageal reflux disease)    Glaucoma, both eyes    History of non-ST elevation myocardial infarction (NSTEMI)    02-25-2009;  05-15-2009;  01-08-2010;  04-04-2012 this MI was post-op surgery for  abscess on 04-03-2012   History of sepsis    Hyperlipemia    Hypertension    Insulin dependent type 2 diabetes mellitus ([place])    followed by pcp   Iron deficiency anemia due to chronic blood loss    uterine bleeding   Legally blind in left eye, as defined in [place]    per pt states vision "is foggy"   Mild obstructive sleep apnea    study in epic 04-19-2012 mild osa,  recommendation's were wt. loss, dental  appliance, surgery, or cpap   Morbid obesity with BMI of 40.0-44.9, adult ([place])    OA (osteoarthritis)    knees   PSVT (paroxysmal supraventricular tachycardia) ([place])    Renal insufficiency    pt. denies   Seasonal allergies    Wears glasses     Patient Active Problem List   Diagnosis Date Noted   COVID-19 virus infection 08/17/2021   COVID 08/17/2021   High anion gap metabolic acidosis 24/58/0998   Dehydration 07/11/2021   Pseudohyponatremia 07/11/2021   Acute kidney injury superimposed on CKD ([place]) 07/11/2021   Hypoalbuminemia due to protein-calorie malnutrition ([place]) 07/11/2021   Low TSH level 07/11/2021   Noncompliance with medication regimen 07/11/2021   Altered mental status 33/82/5053   Acute metabolic encephalopathy 97/67/3419   Hyperosmolar hyperglycemic state (HHS) ([place]) 07/10/2021   Nausea & vomiting 12/01/2020   Ovarian mass,  left 12/01/2020   Hypokalemia 12/01/2020   Leukocytosis 12/01/2020   Generalized weakness 11/30/2020   Acute respiratory failure with hypoxia ([place]) 01/24/2020   Chest pain 01/23/2020   Acute exacerbation of CHF (congestive heart failure) ([place]) 01/23/2020   Functional urinary incontinence 09/07/2019   Urge incontinence of urine 09/07/2019   Legally blind in right eye, as defined in [place] 08/11/2018   Bilateral carpal tunnel syndrome 04/13/2018   Adenomatous polyp of colon 04/13/2018   Fibrocystic breast changes, right 12/12/2017   Drug-induced constipation 09/15/2017   Primary osteoarthritis of both knees 04/05/2017   Chronic  bilateral low back pain 03/07/2017   Chronic pain of right knee 03/07/2017   Vitamin B12 deficiency 12/09/2016   Incidental lung nodule, > 86mm and < 51mm 09/14/2016   Vitreous hemorrhage of right eye ([place])    Diabetic gastroparesis associated with type 2 diabetes mellitus ([place]) 09/12/2015   Controlled type 2 diabetes mellitus with complication, with long-term current use of insulin ([place]) 09/12/2015   Chronic renal insufficiency, stage III (moderate) (HCC) 03/19/2015   Prolonged Q-T interval on ECG 08/10/2013   Dyslipidemia 04/02/2013   Acute on chronic diastolic heart failure ([place]) 04/07/2012   Presumed OSA (obstructive sleep apnea) 04/06/2012   NSTEMI  post-op 04/04/12-medical Rx 04/04/2012   Chronic diastolic heart failure ([place]) 04/2012   Severe obesity (BMI >= 40) ([place]) 01/17/2012   GERD 08/21/2009   Complex endometrial hyperplasia with atypia 03/20/2009   PERIPHERAL EDEMA 03/20/2009   Coronary atherosclerosis-not a surgical or PCI candidate 2010 02/27/2009   Proliferative diabetic retinopathy ([place]) 10/07/2007   DETACHED RETINA, BILATERAL, HX OF 09/07/2007   History of chronic Iron defeicency anemia with acute post op anemia, transfused after debridment 04/04/12 04/17/2007   Essential hypertension 04/17/2007    Past Surgical History:  Procedure Laterality Date   BIOPSY  02/13/2018   Procedure: BIOPSY;  Surgeon: Ladene Artist, MD;  Location: [facility];  Service: Endoscopy;;   BREAST SURGERY Right 2019    breast biopsy,  per pt benign   CARDIAC CATHETERIZATION  02-27-2009   dr al little   diffuse 3V CAD , involving RCA, CFx, and D1;  inferior wall abnormalities, low normal ef 50%   CATARACT EXTRACTION Right    CATARACT EXTRACTION W/ INTRAOCULAR LENS IMPLANT Left 10-11-2007   @[facility]    COLONOSCOPY WITH PROPOFOL N/A 02/13/2018   Procedure: COLONOSCOPY WITH PROPOFOL;  Surgeon: Ladene Artist, MD;  Location: [facility];  Service: Endoscopy;  Laterality: N/A;   DILATION AND CURETTAGE  OF UTERUS  x2 last one 2000   EYE SURGERY Bilateral 2009 to 2010   x2  left eye;  x2  right eye    HYSTEROSCOPY WITH D & C N/A 10/11/2018   Procedure: DILATATION AND CURETTAGE /HYSTEROSCOPY;  Surgeon: [name], MD;  Location: [facility];  Service: Gynecology;  Laterality: N/A;   INCISION AND DRAINAGE PERIRECTAL ABSCESS  04/03/2012   INTRAUTERINE DEVICE (IUD) INSERTION  10/11/2018   Procedure: INTRAUTERINE DEVICE (IUD) INSERTION;  Surgeon: [name], MD;  Location: [facility];  Service: Gynecology;;   IR US GUIDE VASC ACCESS LEFT  02/13/2018   IR VENIPUNCTURE 65YRS/OLDER BY MD  02/13/2018   POLYPECTOMY  02/13/2018   Procedure: POLYPECTOMY;  Surgeon: Ladene Artist, MD;  Location: [facility];  Service: Endoscopy;;    Current Outpatient Rx   Order #: [id number] Class: Normal   Order #: [id number] Class: Historical Med   Order #: [id number] Class: Normal   Order #: [id number] Class:  Normal   Order #: [id number] Class: Historical Med   Order #: [id number] Class: Normal   Order #: [id number] Class: Print   Order #: [id number] Class: Normal   Order #: [id number] Class: Normal   Order #: [id number] Class: Normal   Order #: [id number] Class: Normal   Order #: [id number] Class: Historical Med   Order #: [id number] Class: Normal   Order #: [id number] Class: No Print   Order #: [id number] Class: Historical Med   Order #: [id number] Class: Normal   Order #: [id number] Class: Normal   Order #: [id number] Class: Print   Order #: [id number] Class: Historical Med   Order #: [id number] Class: Normal   Order #: [id number] Class: Historical Med   Order #: [id number] Class: Normal   Order #: [id number] Class: Historical Med   Order #: [id number] Class: Normal   Order #: [id number] Class: Historical Med   Order #: [id number] Class: Normal   Order #: [id number] Class: Normal   Order #: [id number] Class: Historical Med    Allergies Ativan [lorazepam] and Orange fruit [citrus]  Family History  Problem Relation Age of Onset   Diabetes Mother     Heart disease Mother    Heart attack Mother    Hypertension Mother    Breast cancer Mother    Colon polyps Father    Diabetes Father    Heart disease Father    Heart attack Father    Stroke Father    Hypertension Father    Diabetes Brother    Diabetes Sister    Heart disease Brother    Heart disease Sister    Diabetes Maternal Grandmother    Hypertension Maternal Grandmother    Hypertension Brother    Hypertension Sister    Breast cancer Cousin    Colon cancer Cousin    Esophageal cancer Neg Hx    Rectal cancer Neg Hx    Stomach cancer Neg Hx     Social History Social History   Tobacco Use   Smoking status: Never   Smokeless tobacco: Never  Vaping Use   Vaping Use: Never used  Substance Use Topics   Alcohol use: No   Drug use: Never    Review of Systems  LEVEL V CAVEAT APPLIES SECONDARY TO unresponsive. ____________________________________________  PHYSICAL EXAM:  VITAL SIGNS: ED Triage Vitals  Enc Vitals Group     BP 10-07-21 0224 (!) 83/55     Pulse Rate 10/07/2021 0228 (!) 116     Resp 2021/10/07 0228 (!) 31     Temp 2021/10/07 0305 (!) 96.5 F (35.8 C)     Temp src --      SpO2 10-07-21 0228 97 %    Constitutional: Well appearing. Well nourished. Eyes: Conjunctivae are normal. Unequal Pupils (baseline).  Head: Atraumatic. Nose: No congestion/rhinnorhea. Mouth/Throat: Mucous membranes are dry. Digested and undigested food in oropharynx.  Oropharynx non-erythematous. Neck: No stridor.  No meningeal signs.   Cardiovascular: tachycardic rate, normal rhythm.  Respiratory: No respiratory effort.  Lungs rhonchourous Gastrointestinal: Soft and nontender. No distention.  Musculoskeletal: No lower extremity tenderness nor edema. No gross deformities of extremities. Neurologic:  Not able to assess 2/2 condition.  Skin:   No rash noted. Psych: Not able to assess 2/2 condition.   ____________________________________________   LABS (all labs ordered are  listed, but only abnormal results are displayed)  Labs Reviewed  AMMONIA - Abnormal; Notable for the following components:      Result Value   Ammonia 40 (*)    All other components within normal limits  LACTIC ACID, PLASMA - Abnormal; Notable for the following  components:   Lactic Acid, Venous >9.0 (*)    All other components within normal limits  CBC WITH DIFFERENTIAL/PLATELET - Abnormal; Notable for the following components:   MCV 105.8 (*)    MCHC 28.8 (*)    RDW 16.0 (*)    Neutro Abs 9.2 (*)    Lymphs Abs 0.6 (*)    All other components within normal limits  URINALYSIS, ROUTINE W REFLEX MICROSCOPIC - Abnormal; Notable for the following components:   APPearance TURBID (*)    Glucose, UA >=500 (*)    Hgb urine dipstick SMALL (*)    Ketones, ur 5 (*)    Protein, ur 100 (*)    Leukocytes,Ua MODERATE (*)    RBC / HPF >50 (*)    WBC, UA >50 (*)    Bacteria, UA MANY (*)    All other components within normal limits  PROTIME-INR - Abnormal; Notable for the following components:   Prothrombin Time 18.2 (*)    INR 1.5 (*)    All other components within normal limits  CK - Abnormal; Notable for the following components:   Total CK 672 (*)    All other components within normal limits  COMPREHENSIVE METABOLIC PANEL - Abnormal; Notable for the following components:   Sodium 151 (*)    Potassium 6.4 (*)    Chloride 113 (*)    CO2 10 (*)    Glucose, Bld 1,013 (*)    BUN 97 (*)    Creatinine, Ser 4.85 (*)    Calcium 10.5 (*)    Total Protein 5.6 (*)    Albumin 1.8 (*)    AST 952 (*)    ALT 148 (*)    GFR, Estimated 9 (*)    Anion gap 28 (*)    All other components within normal limits  LACTIC ACID, PLASMA - Abnormal; Notable for the following components:   Lactic Acid, Venous >9.0 (*)    All other components within normal limits  CBG MONITORING, ED - Abnormal; Notable for the following components:   Glucose-Capillary >600 (*)    All other components within normal limits   CBG MONITORING, ED - Abnormal; Notable for the following components:   Glucose-Capillary >600 (*)    All other components within normal limits  I-STAT VENOUS BLOOD GAS, ED - Abnormal; Notable for the following components:   pH, Ven 7.167 (*)    pCO2, Ven 33.6 (*)    pO2, Ven 59 (*)    Bicarbonate 12.2 (*)    TCO2 13 (*)    Acid-base deficit 15.0 (*)    Sodium 151 (*)    Potassium 7.3 (*)    Calcium, Ion 1.08 (*)    All other components within normal limits  I-STAT ARTERIAL BLOOD GAS, ED - Abnormal; Notable for the following components:   pH, Arterial 7.183 (*)    pO2, Arterial 243 (*)    Bicarbonate 16.5 (*)    TCO2 18 (*)    Acid-base deficit 12.0 (*)    Sodium 156 (*)    HCT 35.0 (*)    Hemoglobin 11.9 (*)    All other components within normal limits  I-STAT CHEM 8, ED - Abnormal; Notable for the following components:   Sodium 151 (*)    Potassium 6.0 (*)    Chloride 119 (*)    BUN 101 (*)    Creatinine, Ser 4.50 (*)    Glucose, Bld >700 (*)    TCO2 15 (*)  All other components within normal limits  CBG MONITORING, ED - Abnormal; Notable for the following components:   Glucose-Capillary >600 (*)    All other components within normal limits  TROPONIN I (HIGH SENSITIVITY) - Abnormal; Notable for the following components:   Troponin I (High Sensitivity) 2,245 (*)    All other components within normal limits  CULTURE, BLOOD (ROUTINE X 2)  CULTURE, BLOOD (ROUTINE X 2)  URINE CULTURE  RAPID URINE DRUG SCREEN, HOSP PERFORMED  ETHANOL  OSMOLALITY  TROPONIN I (HIGH SENSITIVITY)   ____________________________________________  EKG   EKG Interpretation  Date/Time:  Oct 29, 2021 02:54:11 EST Ventricular Rate:  127 PR Interval:  168 QRS Duration: 127 QT Interval:  388 QTC Calculation: 564 R Axis:   -77 Text Interpretation: Sinus tachycardia with irregular rate Probable left atrial enlargement Right bundle branch block LVH with IVCD and secondary repol  abnrm Inferior infarct, old Probable posterior infarct, acute Prolonged QT interval >>> Acute MI <<< Confirmed by Merrily Pew [id number]) on 10/29/21 5:18:50 AM        ____________________________________________  RADIOLOGY  DG Chest Portable 1 View  Result Date: Oct 29, 2021 CLINICAL DATA:  Intubation, unresponsive. EXAM: PORTABLE CHEST 1 VIEW COMPARISON:  08/25/2021 FINDINGS: The heart size and mediastinal contours are stable. Patchy airspace disease is noted in the perihilar region on the right. The left lung is clear. No effusion or pneumothorax. No acute osseous abnormality. The distal tip of endotracheal tube terminates 2 cm above the carina. The side port of the enteric tube is above the level of the diaphragm and should be advanced approximately 10 cm. IMPRESSION: 1. Patchy perihilar airspace disease on the right, possible edema versus infiltrate. 2. The side port of the enteric tube is in the distal esophagus and should be advanced approximately 10 cm. Electronically Signed   By: Brett Fairy M.D.   On: October 29, 2021 03:53   ____________________________________________   PROCEDURES  Procedure(s) performed:   .Critical Care Performed by: Merrily Pew, MD Authorized by: Merrily Pew, MD   Critical care provider statement:    Critical care time (minutes):  40   Critical care was necessary to treat or prevent imminent or life-threatening deterioration of the following conditions:  Cardiac failure, CNS failure or compromise, dehydration, metabolic crisis, respiratory failure, shock, renal failure and endocrine crisis   Critical care was time spent personally by me on the following activities:  Development of treatment plan with patient or surrogate, discussions with consultants, evaluation of patient's response to treatment, examination of patient, ordering and review of laboratory studies, ordering and review of radiographic studies, ordering and performing treatments and interventions,  pulse oximetry, re-evaluation of patient's condition and review of old charts .Central Line  Date/Time: 10-29-21 5:56 AM Performed by: Merrily Pew, MD Authorized by: Merrily Pew, MD   Consent:    Consent obtained:  Emergent situation Pre-procedure details:    Indication(s): central venous access and insufficient peripheral access     Hand hygiene: Hand hygiene performed prior to insertion     Sterile barrier technique: All elements of maximal sterile technique followed     Skin preparation:  2% chlorhexidine   Skin preparation agent: Skin preparation agent completely dried prior to procedure   Sedation:    Sedation type:  Deep Anesthesia:    Anesthesia method:  None Procedure details:    Location:  R internal jugular   Site selection rationale:  Safest   Patient position:  Reverse Trendelenburg   Procedural supplies:  Triple lumen   Catheter size:  7 Fr   Ultrasound guidance: yes     Ultrasound guidance timing: prior to insertion and real time     Sterile ultrasound techniques: Sterile gel and sterile probe covers were used     Number of attempts:  1   Successful placement: yes   Post-procedure details:    Post-procedure:  Dressing applied and line sutured   Assessment:  Blood return through all ports, free fluid flow and no pneumothorax on x-ray   Procedure completion:  Tolerated well, no immediate complications Procedure Name: Intubation Date/Time: Oct 14, 2021 5:57 AM Performed by: Merrily Pew, MD Pre-anesthesia Checklist: Patient identified, Patient being monitored, Emergency Drugs available, Timeout performed and Suction available Oxygen Delivery Method: Non-rebreather mask Preoxygenation: Pre-oxygenation with 100% oxygen Induction Type: Rapid sequence Ventilation: Mask ventilation without difficulty Laryngoscope Size: Glidescope Grade View: Grade II Tube size: 7.5 mm Number of attempts: 1 Airway Equipment and Method: Rigid stylet Placement Confirmation: ETT  inserted through vocal cords under direct vision, CO2 detector and Breath sounds checked- equal and bilateral Secured at: 22 cm Tube secured with: ETT holder Dental Injury: Teeth and Oropharynx as per pre-operative assessment     CPR  Date/Time: 10/14/21 5:58 AM Performed by: Merrily Pew, MD Authorized by: Merrily Pew, MD  CPR Procedure Details:      Amount of time prior to administration of ACLS/BLS (minutes):  10   ACLS/BLS initiated by EMS: No     CPR/ACLS performed in the ED: Yes     Duration of CPR (minutes):  5   Outcome: ROSC obtained    CPR performed via ACLS guidelines under my direct supervision.  See RN documentation for details including defibrillator use, medications, doses and timing. CPR  Date/Time: 2021/10/14 5:58 AM Performed by: Merrily Pew, MD Authorized by: Merrily Pew, MD  CPR Procedure Details:      Amount of time prior to administration of ACLS/BLS (minutes):  30   ACLS/BLS initiated by EMS: No     CPR/ACLS performed in the ED: Yes     Duration of CPR (minutes):  5   Outcome: ROSC obtained    CPR performed via ACLS guidelines under my direct supervision.  See RN documentation for details including defibrillator use, medications, doses and timing. CPR  Date/Time: Oct 14, 2021 5:59 AM Performed by: Merrily Pew, MD Authorized by: Merrily Pew, MD  CPR Procedure Details:      Amount of time prior to administration of ACLS/BLS (minutes):  120   Outcome: ROSC obtained    CPR performed via ACLS guidelines under my direct supervision.  See RN documentation for details including defibrillator use, medications, doses and timing. ____________________________________________   INITIAL IMPRESSION / ASSESSMENT AND PLAN / ED COURSE  Pertinent labs & imaging results that were available during my care of the patient were reviewed by me and considered in my medical decision making (see chart for details).   66 yo F here unresponsive. Initial ddx included  head bleed, HHS, ACS, Septic shock among others. Patient had no meaningful breathing. Phenylephrine and fluids administered prior to intubation however patient still had cardiac arrest afterwards. After a couple rounds of CPR she regained pulses in [place]. Amiodarone started. Calcium/bicarb/epi in code. Magnesium given as well. Fluids/NE/Bicarb infusion started. CBG 'High' consistent with EMS reading. Patient required more and more pressors and ultimately coded multiple more times. Was able to get ROSC relatively quickly all those times. Multiple medications given (see code documentation) to cover for HHS/DKA, acidosis,  sepsis. Troponin came back significantly elevated but difficult to tell if it was from shock or primary cardiac. Procedures by myself as above. CCM to admit.   ____________________________________________  FINAL CLINICAL IMPRESSION(S) / ED DIAGNOSES  Final diagnoses:  Acidemia  Hyperkalemia  Unresponsive  Shock ([place])  V tach    MEDICATIONS GIVEN DURING THIS VISIT:  Medications  sodium bicarbonate 150 mEq in sterile water 1,150 mL infusion ( Intravenous Stopped 2021-10-22 0547)  fentaNYL 2548mcg in NS 214mL (46mcg/ml) infusion-PREMIX (0 mcg/hr Intravenous Stopped 10/22/21 0547)  amiodarone (NEXTERONE) 1.8 mg/mL load via infusion 150 mg (150 mg Intravenous Bolus from Bag 10-22-2021 0255)    Followed by  amiodarone (NEXTERONE PREMIX) 360-4.14 MG/200ML-% (1.8 mg/mL) IV infusion (0 mg/hr Intravenous Stopped 22-Oct-2021 0547)    Followed by  amiodarone (NEXTERONE PREMIX) 360-4.14 MG/200ML-% (1.8 mg/mL) IV infusion (has no administration in time range)  vasopressin 20 units/100 mL infusion SOLN (has no administration in time range)  dextrose 5 % in lactated ringers infusion (0 mLs Intravenous Hold 2021/10/22 0517)  dextrose 50 % solution 0-50 mL (has no administration in time range)  lactated ringers bolus 1,000 mL (has no administration in time range)  EPINEPHrine (ADRENALIN) 5 mg in NS 250 mL  (0.02 mg/mL) premix infusion (0 mcg/min Intravenous Stopped 10/22/2021 0547)  vasopressin (PITRESSIN) 20 Units in sodium chloride 0.9 % 100 mL infusion-*FOR SHOCK* (0 Units/min Intravenous Stopped Oct 22, 2021 0547)  norepinephrine (LEVOPHED) 4-5 MG/250ML-% infusion SOLN (has no administration in time range)  lactated ringers bolus 2,360 mL (has no administration in time range)  insulin regular, human (MYXREDLIN) 100 units/ 100 mL infusion (0 Units/hr Intravenous Stopped 10-22-21 0547)  lactated ringers infusion (has no administration in time range)  dextrose 5 % in lactated ringers infusion (has no administration in time range)  dextrose 50 % solution 0-50 mL (has no administration in time range)  sodium bicarbonate injection (100 mEq Intravenous Given 22-Oct-2021 0450)  norepinephrine (LEVOPHED) 16 mg in 287mL premix infusion (0 mcg/min Intravenous Stopped 10/22/2021 0547)  norepinephrine (LEVOPHED) 4-5 MG/250ML-% infusion SOLN (has no administration in time range)  EPINEPHrine (ADRENALIN) 1 MG/10ML injection (1 mg Intravenous Given October 22, 2021 0545)  lidocaine (cardiac) 100 mg/59mL (XYLOCAINE) injection 2% (100 mg Intravenous Given Oct 22, 2021 0545)  etomidate (AMIDATE) injection (20 mg Intravenous Given 2021/10/22 0229)  rocuronium (ZEMURON) injection (100 mg Intravenous Given 10-22-2021 0229)  PHENYLephrine 40 mcg/ml in normal saline Adult IV Push Syringe (For Blood Pressure Support) (100 mcg Intravenous Given 10/22/21 0235)  EPINEPHrine (ADRENALIN) 1 MG/10ML injection (1 mg Intravenous Given Oct 22, 2021 0238)  sodium bicarbonate injection (100 mEq Intravenous Given 22-Oct-2021 0239)  calcium chloride injection (1 g Intravenous Given 10/22/2021 0240)  amiodarone (CORDARONE) injection (150 mg Intravenous Given 10/22/2021 0245)  lactated ringers bolus 1,000 mL (0 mLs Intravenous Stopped 2021/10/22 0428)  insulin aspart (novoLOG) injection 5 Units (5 Units Intravenous Given 22-Oct-2021 0308)  EPINEPHrine (ADRENALIN) 1 MG/10ML injection (1 mg  Intravenous Given 2021/10/22 0319)  calcium chloride injection (1 g Intravenous Given 2021-10-22 0324)  vancomycin (VANCOREADY) IVPB 2000 mg/400 mL (0 mg Intravenous Stopped 10-22-2021 0547)  ceFEPIme (MAXIPIME) 2 g in sodium chloride 0.9 % 100 mL IVPB (0 g Intravenous Stopped 10/22/2021 0514)  EPINEPHrine (ADRENALIN) 1 MG/10ML injection (1 mg Intravenous Given October 22, 2021 0355)    NEW OUTPATIENT MEDICATIONS STARTED DURING THIS VISIT:  New Prescriptions   No medications on file    Note:  This document was prepared using Dragon voice recognition software and may include unintentional dictation errors.  Anneka Studer, Corene Cornea, MD Oct 09, 2021 [id number]    Merrily Pew, MD Oct 09, 2021 [phone]

## 2021-09-30 NOTE — ED Notes (Signed)
[facility] would like to hold the patient for eye ad skin donation. They will reach out to the family to discuss.

## 2021-09-30 NOTE — ED Notes (Signed)
Georgann Housekeeper NP CCM paged regarding family in consultation room. To come give an update

## 2021-09-30 NOTE — ED Notes (Signed)
CCM at bedside 

## 2021-09-30 NOTE — ED Notes (Signed)
2g Mag going Verbal Mesner MD

## 2021-09-30 NOTE — ED Notes (Signed)
Mag infusion complete

## 2021-09-30 NOTE — Code Documentation (Signed)
Levo starting at 4mcg per Verbal Mesner MD.

## 2021-09-30 NOTE — Progress Notes (Signed)
Shortly after intubation, pt lost pulses and CPR was started.  Pt was removed from vent and bagged during CPR.  Pt regained pulses and placed back on vent.  Rt obtained an ABG at this time, awaiting result.

## 2021-09-30 NOTE — ED Notes (Signed)
Two minute cardiac arrest, PEA sudden loss of pulses. One epi given. ST upon ROSC. MD at bedside to start CVC

## 2021-09-30 NOTE — Code Documentation (Signed)
Hoffman NP made aware of critical labs K 6.4, Glucose 1013

## 2021-09-30 NOTE — ED Triage Notes (Signed)
Pt bib [facility] from home found unreponsive for possible 8-10 hours. Family said pt has been unresponsive. CBG with EMS was reading high.   EMS 90/62 bp

## 2021-09-30 NOTE — ED Notes (Signed)
Trauma Event Note  Assisted primary RN and ED team with code blue and resuscitation of patient.   Last imported Vital Signs BP 109/63    Pulse 89    Temp 100.2 F (37.9 C)    Resp (!) 24    LMP 04/02/2012    SpO2 100%   Trending CBC Recent Labs    10/19/2021 0248 2021-10-19 0304 10/19/2021 0306 2021-10-19 0423  WBC 10.0  --   --   --   HGB 13.2 13.3 11.9* 13.3  HCT 45.8 39.0 35.0* 39.0  PLT 168  --   --   --     Trending Coag's Recent Labs    10/19/2021 0248  INR 1.5*    Trending BMET Recent Labs    10-19-2021 0304 10-19-21 0306 10-19-2021 0423  NA 151* 156* 151*  K 7.3* 5.1 6.0*  CL  --   --  119*  BUN  --   --  101*  CREATININE  --   --  4.50*  GLUCOSE  --   --  >700*      [name] O [name]  Trauma Response RN  Please call TRN at [phone] for further assistance.

## 2021-09-30 NOTE — Code Documentation (Signed)
Tod 38 HOFFMAN NP CCM

## 2021-09-30 NOTE — Progress Notes (Signed)
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 Husband was apparently a poor historian. Patient presented 2/28 unresponsive and was emergently intubated int he ED. Suffered PEA arrest shortly after intubation. Suffered three separate arrests in the [facility] ED. ROSC was achieved. The patient was started on vasopressors. Required 161mcg norepi, 67mcg epi, and vasopressin to maintain MAP 65 transiently. Etiology was unclear. Appropriate treatment was initiated for DKA, sepsis, metabolic acidosis, VT, and undifferentiated shock.    PCCM was called to admit. Shortly after PCCM eval the patient suffered PEA arrest. 2 rounds CPR done, VT, defib, CPR. ROSC briefly obtained (30 seconds). Then suffered additional arrest. Code was called after a total ~20-25 mins ACLS. Family en route and will be updated upon arrival. See code sheet for more detailed description of events.     [name], AGACNP-BC [facility]  See Amion for personal pager PCCM on call pager [phone] until 7pm. Please call Elink 7p-7a. [phone]  10-17-21 6:43 AM

## 2021-09-30 DEATH — deceased

## 2021-10-01 LAB — CULTURE, BLOOD (ROUTINE X 2)

## 2021-10-01 LAB — URINE CULTURE: Culture: >=100000 — AB

## 2021-10-04 LAB — CULTURE, BLOOD (ROUTINE X 2)
Culture: NO GROWTH
Special Requests: ADEQUATE

## 2022-09-02 DEATH — deceased
